# Patient Record
Sex: Female | Born: 1940 | Race: White | Hispanic: No | Smoking: Never smoker
Health system: Southern US, Community
[De-identification: ages and names within clinical notes are randomized; demographics above are authoritative.]

## PROBLEM LIST (undated history)

## (undated) DIAGNOSIS — I2510 Atherosclerotic heart disease of native coronary artery without angina pectoris: Secondary | ICD-10-CM

## (undated) DIAGNOSIS — E7800 Pure hypercholesterolemia, unspecified: Secondary | ICD-10-CM

## (undated) DIAGNOSIS — I480 Paroxysmal atrial fibrillation: Secondary | ICD-10-CM

## (undated) DIAGNOSIS — S72143A Displaced intertrochanteric fracture of unspecified femur, initial encounter for closed fracture: Secondary | ICD-10-CM

## (undated) DIAGNOSIS — K648 Other hemorrhoids: Secondary | ICD-10-CM

## (undated) DIAGNOSIS — R112 Nausea with vomiting, unspecified: Secondary | ICD-10-CM

## (undated) DIAGNOSIS — G43909 Migraine, unspecified, not intractable, without status migrainosus: Secondary | ICD-10-CM

## (undated) DIAGNOSIS — F419 Anxiety disorder, unspecified: Secondary | ICD-10-CM

## (undated) DIAGNOSIS — I5189 Other ill-defined heart diseases: Secondary | ICD-10-CM

## (undated) DIAGNOSIS — M810 Age-related osteoporosis without current pathological fracture: Secondary | ICD-10-CM

## (undated) DIAGNOSIS — M1990 Unspecified osteoarthritis, unspecified site: Secondary | ICD-10-CM

## (undated) DIAGNOSIS — Z87898 Personal history of other specified conditions: Secondary | ICD-10-CM

## (undated) DIAGNOSIS — I499 Cardiac arrhythmia, unspecified: Secondary | ICD-10-CM

## (undated) DIAGNOSIS — Z8719 Personal history of other diseases of the digestive system: Secondary | ICD-10-CM

## (undated) DIAGNOSIS — D333 Benign neoplasm of cranial nerves: Secondary | ICD-10-CM

## (undated) DIAGNOSIS — Z9889 Other specified postprocedural states: Secondary | ICD-10-CM

## (undated) DIAGNOSIS — K642 Third degree hemorrhoids: Secondary | ICD-10-CM

## (undated) DIAGNOSIS — R42 Dizziness and giddiness: Secondary | ICD-10-CM

## (undated) DIAGNOSIS — I10 Essential (primary) hypertension: Secondary | ICD-10-CM

## (undated) DIAGNOSIS — F32A Depression, unspecified: Secondary | ICD-10-CM

## (undated) DIAGNOSIS — H8110 Benign paroxysmal vertigo, unspecified ear: Secondary | ICD-10-CM

## (undated) DIAGNOSIS — H269 Unspecified cataract: Secondary | ICD-10-CM

## (undated) DIAGNOSIS — F329 Major depressive disorder, single episode, unspecified: Secondary | ICD-10-CM

## (undated) DIAGNOSIS — I080 Rheumatic disorders of both mitral and aortic valves: Secondary | ICD-10-CM

## (undated) DIAGNOSIS — K219 Gastro-esophageal reflux disease without esophagitis: Secondary | ICD-10-CM

## (undated) DIAGNOSIS — K2210 Ulcer of esophagus without bleeding: Secondary | ICD-10-CM

## (undated) HISTORY — PX: CATARACT EXTRACTION W/ INTRAOCULAR LENS  IMPLANT, BILATERAL: SHX1307

## (undated) HISTORY — DX: Pure hypercholesterolemia, unspecified: E78.00

## (undated) HISTORY — DX: Essential (primary) hypertension: I10

## (undated) HISTORY — DX: Third degree hemorrhoids: K64.2

## (undated) HISTORY — DX: Ulcer of esophagus without bleeding: K22.10

## (undated) HISTORY — DX: Displaced intertrochanteric fracture of unspecified femur, initial encounter for closed fracture: S72.143A

## (undated) HISTORY — PX: TOTAL ABDOMINAL HYSTERECTOMY: SHX209

## (undated) HISTORY — DX: Gastro-esophageal reflux disease without esophagitis: K21.9

## (undated) HISTORY — PX: DILATION AND CURETTAGE OF UTERUS: SHX78

## (undated) HISTORY — DX: Age-related osteoporosis without current pathological fracture: M81.0

## (undated) HISTORY — PX: EYE SURGERY: SHX253

## (undated) HISTORY — DX: Unspecified cataract: H26.9

## (undated) HISTORY — DX: Personal history of other specified conditions: Z87.898

## (undated) HISTORY — PX: FRACTURE SURGERY: SHX138

---
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
## 1999-11-09 HISTORY — PX: ESOPHAGOGASTRODUODENOSCOPY (EGD) WITH ESOPHAGEAL DILATION: SHX5812

## 1999-12-27 ENCOUNTER — Inpatient Hospital Stay (HOSPITAL_COMMUNITY): Admission: EM | Admit: 1999-12-27 | Discharge: 1999-12-29 | Payer: Self-pay | Admitting: Emergency Medicine

## 1999-12-27 ENCOUNTER — Encounter: Payer: Self-pay | Admitting: Cardiology

## 1999-12-27 ENCOUNTER — Encounter: Payer: Self-pay | Admitting: Emergency Medicine

## 1999-12-28 ENCOUNTER — Encounter: Payer: Self-pay | Admitting: Cardiology

## 2000-01-05 ENCOUNTER — Other Ambulatory Visit: Admission: RE | Admit: 2000-01-05 | Discharge: 2000-01-05 | Payer: Self-pay | Admitting: *Deleted

## 2001-02-13 ENCOUNTER — Other Ambulatory Visit: Admission: RE | Admit: 2001-02-13 | Discharge: 2001-02-13 | Payer: Self-pay | Admitting: Family Medicine

## 2001-02-22 ENCOUNTER — Encounter: Admission: RE | Admit: 2001-02-22 | Discharge: 2001-02-22 | Payer: Self-pay | Admitting: Family Medicine

## 2001-02-22 ENCOUNTER — Encounter: Payer: Self-pay | Admitting: Family Medicine

## 2001-05-31 ENCOUNTER — Encounter: Payer: Self-pay | Admitting: Family Medicine

## 2001-05-31 ENCOUNTER — Ambulatory Visit (HOSPITAL_COMMUNITY): Admission: RE | Admit: 2001-05-31 | Discharge: 2001-05-31 | Payer: Self-pay | Admitting: Family Medicine

## 2001-06-08 ENCOUNTER — Encounter: Payer: Self-pay | Admitting: Emergency Medicine

## 2001-06-08 ENCOUNTER — Emergency Department (HOSPITAL_COMMUNITY): Admission: EM | Admit: 2001-06-08 | Discharge: 2001-06-08 | Payer: Self-pay | Admitting: Emergency Medicine

## 2002-02-19 ENCOUNTER — Other Ambulatory Visit: Admission: RE | Admit: 2002-02-19 | Discharge: 2002-02-19 | Payer: Self-pay | Admitting: Family Medicine

## 2003-05-03 ENCOUNTER — Other Ambulatory Visit: Admission: RE | Admit: 2003-05-03 | Discharge: 2003-05-03 | Payer: Self-pay | Admitting: Family Medicine

## 2004-02-13 ENCOUNTER — Encounter (INDEPENDENT_AMBULATORY_CARE_PROVIDER_SITE_OTHER): Payer: Self-pay | Admitting: Specialist

## 2004-02-13 ENCOUNTER — Inpatient Hospital Stay (HOSPITAL_COMMUNITY): Admission: RE | Admit: 2004-02-13 | Discharge: 2004-02-15 | Payer: Self-pay | Admitting: Obstetrics and Gynecology

## 2005-02-08 ENCOUNTER — Encounter: Admission: RE | Admit: 2005-02-08 | Discharge: 2005-02-08 | Payer: Self-pay | Admitting: Family Medicine

## 2005-10-31 ENCOUNTER — Inpatient Hospital Stay (HOSPITAL_COMMUNITY): Admission: AD | Admit: 2005-10-31 | Discharge: 2005-11-03 | Payer: Self-pay | Admitting: Internal Medicine

## 2005-11-02 ENCOUNTER — Ambulatory Visit: Payer: Self-pay | Admitting: Cardiology

## 2005-11-02 ENCOUNTER — Encounter: Payer: Self-pay | Admitting: Cardiology

## 2006-01-12 ENCOUNTER — Ambulatory Visit: Payer: Self-pay | Admitting: Cardiology

## 2006-02-23 ENCOUNTER — Ambulatory Visit: Payer: Self-pay | Admitting: Cardiology

## 2006-05-10 ENCOUNTER — Ambulatory Visit: Payer: Self-pay | Admitting: Cardiology

## 2006-06-02 ENCOUNTER — Ambulatory Visit (HOSPITAL_COMMUNITY): Admission: RE | Admit: 2006-06-02 | Discharge: 2006-06-02 | Payer: Self-pay | Admitting: Family Medicine

## 2006-06-08 ENCOUNTER — Ambulatory Visit (HOSPITAL_COMMUNITY): Admission: RE | Admit: 2006-06-08 | Discharge: 2006-06-08 | Payer: Self-pay | Admitting: Gastroenterology

## 2006-06-29 ENCOUNTER — Ambulatory Visit: Payer: Self-pay | Admitting: Cardiology

## 2006-11-08 HISTORY — PX: COLONOSCOPY: SHX174

## 2007-03-01 ENCOUNTER — Other Ambulatory Visit: Admission: RE | Admit: 2007-03-01 | Discharge: 2007-03-01 | Payer: Self-pay | Admitting: Internal Medicine

## 2007-07-20 ENCOUNTER — Ambulatory Visit (HOSPITAL_COMMUNITY): Admission: RE | Admit: 2007-07-20 | Discharge: 2007-07-20 | Payer: Self-pay | Admitting: Ophthalmology

## 2007-08-24 ENCOUNTER — Ambulatory Visit (HOSPITAL_COMMUNITY): Admission: RE | Admit: 2007-08-24 | Discharge: 2007-08-24 | Payer: Self-pay | Admitting: Ophthalmology

## 2007-10-13 ENCOUNTER — Ambulatory Visit: Payer: Self-pay | Admitting: Gastroenterology

## 2007-10-16 ENCOUNTER — Ambulatory Visit (HOSPITAL_COMMUNITY): Admission: RE | Admit: 2007-10-16 | Discharge: 2007-10-16 | Payer: Self-pay | Admitting: Gastroenterology

## 2007-10-16 ENCOUNTER — Ambulatory Visit: Payer: Self-pay | Admitting: Gastroenterology

## 2007-11-29 ENCOUNTER — Ambulatory Visit: Payer: Self-pay | Admitting: Gastroenterology

## 2008-07-29 DIAGNOSIS — E785 Hyperlipidemia, unspecified: Secondary | ICD-10-CM | POA: Insufficient documentation

## 2008-07-29 DIAGNOSIS — I10 Essential (primary) hypertension: Secondary | ICD-10-CM | POA: Insufficient documentation

## 2008-07-29 DIAGNOSIS — M810 Age-related osteoporosis without current pathological fracture: Secondary | ICD-10-CM

## 2008-07-29 DIAGNOSIS — F411 Generalized anxiety disorder: Secondary | ICD-10-CM

## 2008-07-29 DIAGNOSIS — K648 Other hemorrhoids: Secondary | ICD-10-CM | POA: Insufficient documentation

## 2008-07-29 DIAGNOSIS — R011 Cardiac murmur, unspecified: Secondary | ICD-10-CM

## 2008-07-29 DIAGNOSIS — K219 Gastro-esophageal reflux disease without esophagitis: Secondary | ICD-10-CM

## 2008-07-29 HISTORY — DX: Other hemorrhoids: K64.8

## 2010-04-09 ENCOUNTER — Ambulatory Visit (HOSPITAL_COMMUNITY): Admission: RE | Admit: 2010-04-09 | Discharge: 2010-04-09 | Payer: Self-pay | Admitting: Family Medicine

## 2010-04-27 ENCOUNTER — Ambulatory Visit: Payer: Self-pay | Admitting: Cardiovascular Disease

## 2010-04-27 DIAGNOSIS — I080 Rheumatic disorders of both mitral and aortic valves: Secondary | ICD-10-CM | POA: Insufficient documentation

## 2010-04-27 HISTORY — DX: Rheumatic disorders of both mitral and aortic valves: I08.0

## 2010-04-28 ENCOUNTER — Ambulatory Visit: Payer: Self-pay | Admitting: Cardiovascular Disease

## 2010-04-28 ENCOUNTER — Ambulatory Visit (HOSPITAL_COMMUNITY): Admission: RE | Admit: 2010-04-28 | Discharge: 2010-04-28 | Payer: Self-pay | Admitting: Cardiovascular Disease

## 2010-04-28 ENCOUNTER — Encounter: Payer: Self-pay | Admitting: Cardiovascular Disease

## 2010-04-29 ENCOUNTER — Encounter: Payer: Self-pay | Admitting: Cardiovascular Disease

## 2010-12-10 NOTE — Letter (Signed)
Summary: PROGRESS NOTES  PROGRESS NOTES   Imported By: Faythe Ghee 04/28/2010 15:16:16  _____________________________________________________________________  External Attachment:    Type:   Image     Comment:   External Document

## 2010-12-10 NOTE — Letter (Signed)
Summary: [place] Results Engineer, agricultural at [facility]  [address] [place] [address]   Phone: [phone]  Fax: [phone]      April 29, 2010 MRN: [id number]   Norma Barajas [address] [place], [place]  [address]   Dear Ms. Engebretson,  Your test ordered by Selena Batten has been reviewed by your physician (or physician assistant) and was found to be normal or stable. Your physician (or physician assistant) felt no changes were needed at this time.  __X__ Echocardiogram  ____ Cardiac Stress Test  ____ Lab Work  ____ Peripheral vascular study of arms, legs or neck  ____ CT scan or X-ray  ____ Lung or Breathing test  ____ Other: Please continue on current medical treatment.  Thank you.   Charlton Haws, MD, F.A.C.C

## 2010-12-10 NOTE — Assessment & Plan Note (Signed)
Summary: **PER Norma WEEKS,NP FOR ABNORMAL EKG/TG   Visit Type:  Follow-up Primary Provider:  susan Barajas NPI   History of Present Illness: Norma Barajas is seen today ofr F/U of fatigue, MR, abnomrla ECG and CRF's of elevated lipids and HTN>  See A/P for ECG issues with misplaced V1 lead at [facility]  No SSCP or previously documented CAD.  Previous echo 2006 with only mild MR.  No CAD by cath in 2006.  Occasional flip flops but no sustained palpitaitons.  Comploant with BP and cholesterol meds.  Not very active with fatigue and overweight.  Current Problems (verified): 1)  Electrocardiogram, Abnormal  (ICD-794.31) 2)  Mitral Regurgitation  (ICD-396.3) 3)  Cardiac Murmur  (ICD-785.2) 4)  Anxiety  (ICD-300.00) 5)  Gerd  (ICD-530.81) 6)  Osteoporosis  (ICD-733.00) 7)  Hyperlipidemia  (ICD-272.4) 8)  Hypertension  (ICD-401.9) 9)  Rectal Bleeding  (ICD-569.3)  Current Medications (verified): 1)  Zocor 20 Mg Tabs (Simvastatin) .... Once Daily 2)  Atenolol 50 Mg Tabs (Atenolol) .... Two Times A Day 3)  Felodipine 5 Mg Xr24h-Tab (Felodipine) .... Once Daily 4)  Calcium +d .... Once Daily 5)  Multivitamins  Tabs (Multiple Vitamin) .... Once Daily 6)  Fish Oil 1000 Mg Caps (Omega-3 Fatty Acids) .... Once Daily 7)  Xanax 0.25 Mg Tabs (Alprazolam) .... As Needed 8)  Omeprazole 20 Mg Cpdr (Omeprazole) .... Once Daily  Allergies (verified): 1)  ! Morphine  Past History:  Past Medical History: Last updated: 05/01/2010 hypertension mitral regurgitation erosive esophagitis chest pain  Past Surgical History: Last updated: 07/29/2008 hysterectomy uterine prolapse cataracts  Family History: Last updated: May 01, 2010 Father:deceased cause unknown Mother:deceased cause unknown  Social History: Last updated: 01-May-2010 Retired  Married  Tobacco Use - No.  Alcohol Use - no Regular Exercise - no Drug Use - no  Review of Systems       Denies fever, malais, weight loss, blurry vision,  decreased visual acuity, cough, sputum, SOB, hemoptysis, pleuritic painheartburn, abdominal pain, melena, lower extremity edema, claudication, or rash.   Vital Signs:  Patient profile:   70 year old female Height:      61 inches Weight:      132 pounds BMI:     25.03 Pulse rate:   64 / minute BP sitting:   139 / 70  (right arm)  Vitals Entered By: Dreama Saa, CNA (April 27, 2010 2:48 PM)  Physical Exam  General:  Affect appropriate Healthy:  appears stated age HEENT: normal Neck supple with no adenopathy JVP normal no bruits no thyromegaly Lungs clear with no wheezing and good diaphragmatic motion Heart:  S1/S2 systolic  murmur,rub, gallop or click PMI normal Abdomen: benighn, BS positve, no tenderness, no AAA no bruit.  No HSM or HJR Distal pulses intact with no bruits No edema Neuro non-focal Skin warm and dry    Impression & Recommendations:  Problem # 1:  MITRAL REGURGITATION (ICD-396.3) F/U echo.  Murmur seems benign.  Doubt progression since 2006 Orders: 2-D Echocardiogram (2D Echo)  Problem # 2:  HYPERLIPIDEMIA (ICD-272.4) Target LDL 100 or less with no known CAD  Cath 2006 clean Her updated medication list for this problem includes:    Zocor 20 Mg Tabs (Simvastatin) ..... Once daily  Problem # 3:  HYPERTENSION (ICD-401.9) Well controlled Her updated medication list for this problem includes:    Atenolol 50 Mg Tabs (Atenolol) .Marland Kitchen..Marland Kitchen Two times a day    Felodipine 5 Mg Xr24h-tab (Felodipine) ..... Once daily  Problem #  4:  ELECTROCARDIOGRAM, ABNORMAL (ICD-794.31) Reviewed ECG from [facility] 03/31/10  Misplaced V1 lead led computer to call RSR' and spetal infarct.  ECG in our office today totally normal Her updated medication list for this problem includes:    Atenolol 50 Mg Tabs (Atenolol) .Marland Kitchen..Marland Kitchen Two times a day    Felodipine 5 Mg Xr24h-tab (Felodipine) ..... Once daily  Patient Instructions: 1)  Your physician recommends that you schedule a follow-up  appointment in: 1 year 2)  Your physician recommends that you continue on your current medications as directed. Please refer to the Current Medication list given to you today. 3)  Your physician has requested that you have an echocardiogram.  Echocardiography is a painless test that uses sound waves to create images of your heart. It provides your doctor with information about the size and shape of your heart and how well your heart's chambers and valves are working.  This procedure takes approximately one hour. There are no restrictions for this procedure.   EKG Report  Procedure date:  04/27/2010  Findings:      NSR 63 Normal ECG Compared to ECG 5/24 in [facility] office V1 lead placed correctly no RSR' or septal infarct seen

## 2010-12-10 NOTE — Letter (Signed)
Summary: EKG 03/31/10  EKG 03/31/10   Imported By: Faythe Ghee 04/28/2010 15:15:58  _____________________________________________________________________  External Attachment:    Type:   Image     Comment:   External Document

## 2010-12-10 NOTE — Letter (Signed)
Summary: PROGRESS NOTES  PROGRESS NOTES   Imported By: Faythe Ghee 04/28/2010 15:16:41  _____________________________________________________________________  External Attachment:    Type:   Image     Comment:   External Document

## 2011-02-18 ENCOUNTER — Encounter: Payer: Self-pay | Admitting: Nurse Practitioner

## 2011-02-18 DIAGNOSIS — D333 Benign neoplasm of cranial nerves: Secondary | ICD-10-CM | POA: Insufficient documentation

## 2011-02-18 DIAGNOSIS — R911 Solitary pulmonary nodule: Secondary | ICD-10-CM | POA: Insufficient documentation

## 2011-02-18 HISTORY — DX: Benign neoplasm of cranial nerves: D33.3

## 2011-03-23 NOTE — Assessment & Plan Note (Signed)
Barajas, Norma                    CHART#:  [id number]   DATE:  11/29/2007                       DOB:  [birth year]   CHIEF COMPLAINT:  Follow-up of rectal bleeding and abdominal bloating.   SUBJECTIVE:  Norma Barajas is a 70 year old lady who recently underwent  colonoscopy on 10/16/07 by Dr. Kassie Mends.  She presented with  complaints of rectal bleeding. She had large internal hemorrhoids but  otherwise unremarkable.  She states her rectal bleeding has stopped  since taking Anusol HC suppositories.  She continues to complain of  abdominal bloating and discomfort and cramps after having a bowel  movements.  She states that she has gained about 20 pounds in the last  one year.  She says her thyroid was checked about a year ago by Dr.  Dimple Casey.  For the past 6 months she has had abdominal bloating and  discomfort.  Seems to have more discomfort after having a bowel  movement.  Denies any heartburn as long as she takes her omeprazole.  Certain foods seem to worsen her symptoms.  Denies any prior x-rays for  any of these symptoms.  She had a hysterectomy which she believes to be  complete about 4 years ago.   CURRENT MEDICATIONS:  See above list.   ALLERGIES:  Morphine.   PHYSICAL EXAMINATION:  VITALS:  Weight 144, temperature 97.3, blood  pressure 150/92, pulse 64.  GENERAL:  Pleasant, well-nourished, well-developed Caucasian female, no  acute distress.  SKIN:  Warm and dry, no jaundice.  HEENT:  Sclerae  nonicteric.  ABDOMEN:  Positive bowel sounds. Abdomen soft,  nondistended.  She has mild lower abdominal discomfort to deep  palpation, no rebound, tenderness or guarding.  No abdominal bruits or  hernias. LOWER EXTREMITIES:  No edema.   IMPRESSION:  Norma Barajas is a 69 year old lady who recently had a  colonoscopy for hematochezia due to large internal hemorrhoids.  Hematochezia has resolved.  She complains of vague abdominal bloating  and cramping, worse with bowel movements.  I offered  her further  evaluation of her abdominal pain via CT.  She would like to try some  probiotic therapy for abdominal bloating and gas prior to making a  decision regarding further workup.   PLAN:  1. Digestive Advantage Gas formula with Probiotics, take one daily #37      was provided.  2. She will call with progress report in 4 weeks and based on how she      is feeling at that time would consider further workup via CT of      abdomen and pelvis.       Tana Coast, P.A.  Electronically Signed     Kassie Mends, M.D.  Electronically Signed    LL/MEDQ  D:  11/29/2007  T:  11/29/2007  Job:  [id number]   cc:   Ernestina Penna, M.D.

## 2011-03-23 NOTE — Op Note (Signed)
Norma Barajas, Norma Barajas                   ACCOUNT NO.:  [id number]   MEDICAL RECORD NO.:  [id number]          PATIENT TYPE:  AMB   LOCATION:  DAY                           FACILITY:  [facility]   PHYSICIAN:  Kassie Mends, M.D.      DATE OF BIRTH:  December 11, 1940   DATE OF PROCEDURE:  10/16/2007  DATE OF DISCHARGE:                               OPERATIVE REPORT   PROCEDURE:  Colonoscopy.   INDICATION FOR EXAM:  Norma Barajas is a 70 year old female who presents with  rectal bleeding.  She has a first degree relative had colon cancer  diagnosed after the age of 2.  Her last colonoscopy was reportedly four  years ago and with Dr. Jarold Motto.   FINDINGS:  1. Normal colon without evidence of polyps, masses, inflammatory      changes, diverticula or arteriovenous malformations.  2. Large internal hemorrhoids.  Otherwise normal retroflexed view of      the rectum.   RECOMMENDATIONS:  1. The only source identified as an etiology for rectal bleeding are      the large internal hemorrhoids.  She should have a screening      colonoscopy in 10 years if she remains healthy since her first      degree relative was diagnosed with colon cancer after the age of      59.  2. She should follow high fiber diet.  She is given a handout on high-      fiber diet and hemorrhoids.  3. She should begin Anusol-HC one per rectum every 12 hours for 14      days.  4. Follow up visit in 4 weeks with Lorenza Burton to reevaluate rectal      bleeding.  If her rectal bleeding persists, she may continue a      conservative approach or we will be happy to make a surgical      referral.  5. She should avoid aspirin for 7 days so as not to precipitate rectal      bleeding.  She may take NSAIDs and have anticoagulation if needed.   MEDICATIONS:  1. Demerol 50 mg IV.  2. Versed 5 mg IV.   PROCEDURE TECHNIQUE:  Physical exam was performed.  Informed consent was  obtained from the patient after explaining the benefits and risks and  alternatives to the procedure.  The patient was connected to monitor and  placed in left lateral position.  Continuous oxygen was provided by  nasal cannula, IV medicine administered through an  indwelling cannula.  After administration of sedation and rectal exam,  scope was advanced under direct visualization to cecum.  The scope was  removed slowly by carefully examining the color, texture, anatomy and  integrity mucosa on the way out.  The patient was recovered in endoscopy  and discharged home in satisfactory condition.      Kassie Mends, M.D.  Electronically Signed     SM/MEDQ  D:  10/16/2007  T:  10/16/2007  Job:  [id number]   cc:   Ernestina Penna, M.D.  Fax:  [phone] 

## 2011-03-23 NOTE — Consult Note (Signed)
NAME:  Norma Barajas, Norma Barajas                   ACCOUNT NO.:  [id number]   MEDICAL RECORD NO.:  [id number]          PATIENT TYPE:  AMB   LOCATION:  DAY                           FACILITY:  [facility]   PHYSICIAN:  Kassie Mends, M.D.      DATE OF BIRTH:  1941-09-14   DATE OF CONSULTATION:  DATE OF DISCHARGE:                                 CONSULTATION   REASON FOR CONSULTATION:  Rectal bleeding.   HISTORY OF PRESENT ILLNESS:  [name] is a 70 year old female.  She  developed abdominal bloating and the cramp-like abdominal pain 6 days  ago.  The following morning she went to  the bathroom.  She had a loose  bowel movement and noticed a moderate amount of bright red blood.  Later  on that day, she noticed when she went to urinate she was having bright  red blood with wiping and in the water.  She has had bright red blood  with each bowel movement since that day.  She is having one to two bowel  movements per day currently.  She denies any nausea or vomiting.  She  denies any recent NSAID use.  She has noticed some low-back pain over  the last 2 days as well.  She denies any anorexia or early satiety,  weight loss.  Denies any dysphagia or odynophagia.  She does have  history of chronic GERD well controlled on PPI.  She denies any chest  pain, palpitations or shortness of breath.  She did have a hemoglobin  done at [facility] on 12/01 which was 15.7   PAST MEDICAL AND SURGICAL HISTORY:  She has a heart murmur.  She has  history of hypertension, hypercholesterolemia, hemorrhoids,  osteoporosis, chronic GERD  with erosive reflux esophagitis and  esophageal stricture with EGD by Dr. Antoine Poche years ago.  She has  history of a colonoscopy she believes was over 4 years ago which was  normal through Dr. Norval Gable office.  She also had a normal  colonoscopy in the 1990s.  She has history of cataract surgery, complete  hysterectomy in April 2005.   CURRENT MEDICATIONS:  1. Zocor 20 mg  daily  2. Atenolol 50 mg b.i.d.  3. Felodipine 5 mg daily  4. Calcium and vitamin D once daily  5. Multivitamin once daily  6. Fish oil once daily  7. Xanax 0.25 mg p.r.n.  8. Omeprazole 20 mg daily.   ALLERGIES:  MORPHINE.   FAMILY HISTORY:  Is positive for mother diagnosed with colon cancer, she  believes in her late 66s.  She died at 7.  Father deceased at age 31  severe coronary artery disease.  She had a sister at that time with  heart problems as well and two brothers who appear healthy.   SOCIAL HISTORY:  [name]  is married.  She has one healthy son.  She is  retired Education officer, environmental.  She denies any tobacco, alcohol or drug use.   REVIEW OF SYSTEMS:  CONSTITUTIONAL:  Weight is steadily increasing.  Denies any fever or chills.  Otherwise negative, see HPI.   PHYSICAL EXAM:  VITAL SIGNS:  Weight 145 pounds, height 5 feet 2 inches,  temperature is 98.,1 blood pressure 178/96, pulse 60.  GENERAL:  [name] Is well-developed, well-nourished Caucasian female in  no acute distress.  HEENT:  Pupils equal.  Sclerae benign.  Conjunctiva are pink.  Oropharynx pink and moist.  She has upper dentures intact.  NECK:  Supple without thyromegaly.  CHEST:  Heart regular rate and rhythm.  She has a 3/6 murmur noted.  LUNGS:  Clear to auscultation bilaterally.  ABDOMEN:  Positive bowel sounds x4.  No bruits auscultated.  Soft,  nontender and without palpable mass or megaly.  No rebound  or guarding.  EXTREMITIES:  Without clubbing or edema bilaterally.  RECTAL:  No external lesions visualized.  Good sphincter tone.  No  masses palpated.  Small amount of dark brown stool obtained from the  vault which was hemoccult positive.   IMPRESSION:  [name] is a 70 year old Caucasian female who has had a 6-  day history of cramp like abdominal pain and bloating and intermittent  rectal bleeding.  She is going to require further evaluation with  colonoscopy at this time to determine etiology of her  rectal bleeding  which could be diverticular in nature versus internal hemorrhoids,  ischemia  or colorectal carcinoma.  She has a family history of colon  cancer in her mother in her late 32s.   PLAN:  1. Colonoscopy with Dr. Cira Servant as soon as possible.  I have discussed      the procedure including risks, benefits, including but not limited      to infection, perforation, drug reaction.  She agrees with the plan      and wishes to proceed.  2  If she develops significant bleeding prior to colonoscopy, she is to  go directly to the emergency room.   We would like to thank Paulene Floor FNP and Dr. Christell Constant for allowing [place] to  participate in the care of Ms. Hoxworth.      Lorenza Burton, N.P.      Kassie Mends, M.D.  Electronically Signed    KJ/MEDQ  D:  10/13/2007  T:  10/13/2007  Job:  [id number]   cc:   Ernestina Penna, M.D.  Fax: [phone]

## 2011-03-26 NOTE — H&P (Signed)
Norma Barajas, Norma Barajas NO.:  [id number]   MEDICAL RECORD NO.:  [id number]                   PATIENT TYPE:   LOCATION:                                       FACILITY:   PHYSICIAN:  Crist Fat. Rivard, M.D.              DATE OF BIRTH:  Jun 04, 1941   DATE OF ADMISSION:  02/13/2004  DATE OF DISCHARGE:                                HISTORY & PHYSICAL   REASON FOR ADMISSION:  Symptomatic pelvic relaxation.   HISTORY OF PRESENT ILLNESS:  This is a 70 year old married white female,  gravida 1, para 1, who presents to me on December 19, 2003, with complaints  of feeling a bulge outside of the vagina for the last month with pressure  and feeling like something is out when she wipes using the bathroom.  She  reports occasional difficulty to initiate voiding but no difficulty with  bowel movements.  She also reports incomplete emptying but denies stress  incontinence.  Nocturia has been stable at two to three voiding episodes per  night, and she denies dyspareunia or feeling of any obstruction during  sexual activity.  She otherwise has been menopausal since age 55 and used  Prempro from age 76 to age 59.  She has now discontinued hormone replacement  therapy for the last two years and reports occasional hot flashes at night.   REVIEW OF SYSTEMS:  HEAD, EYES, EARS, NOSE, AND THROAT:  Negative.  CARDIOVASCULAR:  Negative.  MUSCULOSKELETAL:  Negative.  GASTROINTESTINAL:  Negative.  GENITOURINARY:  See HPI.  PSYCHIATRIC:  Negative.  NEUROLOGIC:  Negative.   PAST MEDICAL HISTORY:  1. Hypertension.  2. Osteopenia.  3. Spontaneous vaginal delivery x1.  4. No previous surgery other than extraction of wisdom teeth.   SOCIAL HISTORY:  Married, nonsmoker, is retired.   FAMILY HISTORY:  Negative for feminine cancer or colon cancer.   CURRENT MEDICATIONS:  1. Fosamax 70 mg each week.  2. Atenolol 50 mg daily.  3. Caduet 520 mg daily.  4. Fish oil 1000 mg daily.  5.  Multivitamin daily.  6. Oral calcium daily.  7. Pepcid AC 20 mg daily.   No known drug allergies.   PHYSICAL EXAMINATION:  VITAL SIGNS:  Current blood pressure is 130/80,  current weight 128 pounds for a height of 5 feet 3/4 inch.  HEENT:  Negative.  NECK:  Thyroid not enlarged.  CARDIAC:  Heart regular rate and rhythm with a systolic murmur 3/4, known to  the patient.  CHEST:  Clear.  BACK:  No CVA tenderness.  ABDOMEN:  Nontender, no hepatosplenomegaly.  EXTREMITIES:  Negative.  NEUROLOGIC:  Within normal limits.  PELVIC:  External genitalia showing cystocele 3/4 with negative stress test,  no urethrocele, no rectocele.  Cervix is normal.  Uterus is prolapsed 3/4,  normal size, normal shape.  Adnexa are normal.  Rectovaginal exam is  negative.  ASSESSMENT:  Symptomatic pelvic relaxation in patient desiring definitive  surgical treatment, including prophylactic bilateral salpingo-oophorectomy.   PLAN:  We have reviewed all possible options with the patient, including  expectant management, trial of pessary, or surgical correction, which the  patient has chosen.  We have planned to proceed with laparoscopy-assisted  vaginal hysterectomy as well as bilateral salpingo-oophorectomy and anterior  repair.  The procedure has been thoroughly reviewed with the patient,  including hospital stay, recovery, possible complications including but not  limited to bleeding, infection, injury to bowel, bladder, or ureters, need  for laparotomy, need for home catheter, 10% risk of postop incontinence.  Risks and benefits of prophylactic bilateral salpingo-oophorectomy have also  been reviewed.  The patient is agreeable to these explanations and desires  the discussed procedure.                                               Crist Fat Rivard, M.D.    SAR/MEDQ  D:  01/22/2004  T:  01/22/2004  Job:  [id number]

## 2011-03-26 NOTE — H&P (Signed)
Norma Barajas, Norma Barajas NO.:  [id number]   MEDICAL RECORD NO.:  [id number]          PATIENT TYPE:  INP   LOCATION:  2912                         FACILITY:  [facility]   PHYSICIAN:  Doylene Canning. Ladona Ridgel, M.D.  DATE OF BIRTH:  [birth year]   DATE OF ADMISSION:  10/31/2005  DATE OF DISCHARGE:                                HISTORY & PHYSICAL   ADMITTING DIAGNOSIS:  Chest pain with both typical, as well as atypical,  components.   HISTORY OF PRESENT ILLNESS:  The patient is a very pleasant 70 year old  woman with a history of hypertension, who underwent catheterization back in  2001 demonstrating normal left ventricular systolic function with at least  moderate, if not moderate to severe, mitral regurgitation.  She had normal  filling pressures at that time and normal coronary arteries.  She has had no  significant chest pain until the last few weeks when she notes intermittent  episodes which she did not mention to any family members.  Today, she began  to experience substernal chest discomfort with some pain in her back.  The  patient denies nausea, vomiting, or shortness of breath.  She denies fevers  or chills.  She has had some cough which has been nonproductive.  She denies  syncope.   PAST MEDICAL HISTORY:  1.  Hypertension.  2.  History of pelvic relaxation surgery.  3.  History of mitral regurgitation as previously noted.   FAMILY HISTORY:  Father with coronary disease.  Both her parents are  deceased.   SOCIAL HISTORY:  The patient denies tobacco or ethanol use and is married.   REVIEW OF SYSTEMS:  Negative for vision or hearing problems, although she  does wear glasses for visual acuity.  She denies nausea, vomiting, diarrhea,  constipation, polyuria, polydipsia, heat or cold intolerance.  She had a  history of esophagitis type symptoms which manifested as chest pain in the  past.  She denies skin problems.  She denies arthritic complaints.  She  denies any  recent weight changes, polyuria, polydipsia, heat or cold  intolerance.  The rest of her review of systems was reviewed in detail and  found to be negative.   PHYSICAL EXAMINATION:  GENERAL:  The patient is a pleasant 70 year old woman  in no distress.  VITAL SIGNS:  The blood pressure was 107/51, pulse 70 and regular,  respirations 18, temperature 98.  HEENT:  Normocephalic and atraumatic.  Pupils equal and round.  The  oropharynx was moist.  The sclerae were anicteric.  NECK:  No JVD.  There was no thyromegaly.  The trachea was midline.  The  carotids were 2+ and symmetric.  LUNGS:  Clear bilaterally to auscultation.  There were no wheezing, rales,  or rhonchi.  CARDIOVASCULAR:  Regular rate and rhythm with normal S1 and S2.  There was a  grade 2/6 systolic murmur at the left lower sternal border.  The PMI was  minimally displaced laterally and not enlarged.  ABDOMEN:  The abdominal exam was soft, nontender, nondistended.  There was  no organomegaly.  Bowel  sounds were present.  EXTREMITIES:  No clubbing, cyanosis, or edema.  The pulses were 2+ and  symmetric.  SKIN:  Normal.  JOINT EXAM:  Normal.  NEUROLOGIC:  Alert and oriented x3 with cranial nerves intact.  Strength was  5/5 and symmetric.   EKG demonstrated no acute changes.  There was voltage criteria for LVH.   IMPRESSION:  1.  Ongoing atypical chest pain.  2.  Hypertension.  3.  Mitral regurgitation.  4.  History of erosive esophagitis.   DISCUSSION:  The etiology of the patient's chest pain is unclear.  I doubt  that this is an acute coronary syndrome.  She does have mitral  regurgitation, which has been at least moderate, if not severe, in the past,  although she is minimally symptomatic with this, having essentially no heart  failure symptoms.  Will plan to check a D-dimer and to proceed with a  sedimentation rate.  She is presently on intravenous nitroglycerin and will  give her Lovenox to start with.  Will give  her a beta blocker and continue  her home medications.  Serial cardiac enzymes will help dictate, along with  how her pain subsides, whether we proceed with an interventional approach or  a conservative approach.           ______________________________  Doylene Canning. Ladona Ridgel, M.D.     GWT/MEDQ  D:  10/31/2005  T:  11/01/2005  Job:  [id number]   cc:   Magnus Sinning. Rice, M.D.  Fax: [phone]   Rollene Rotunda, M.D.  [phone]  [address]  [place]  [place] [address]

## 2011-03-26 NOTE — Discharge Summary (Signed)
NAMESHAJUANA, MCLUCAS NO.:  [id number]   MEDICAL RECORD NO.:  [id number]          PATIENT TYPE:  INP   LOCATION:  2905                         FACILITY:  [facility]   PHYSICIAN:  Maple Mirza, P.A. DATE OF BIRTH:  1940/11/15   DATE OF ADMISSION:  10/31/2005  DATE OF DISCHARGE:                                 DISCHARGE SUMMARY   ADDENDUM:  Ms. Crosley had a CT scan of the chest to rule out pulmonary  embolism on December 27.  She was premedicated for possible contrast allergy  since she had a burning feeling all over after the left heart  catheterization on December 26.  She had no such symptoms at this CT study.  The study was negative for pulmonary embolism.  The patient has been ruled  out for pulmonary embolism doubly in a sense because her D-dimer was 0.24  and the patient has been on subcutaneous Lovenox this hospitalization.      Maple Mirza, P.A.     GM/MEDQ  D:  11/03/2005  T:  11/03/2005  Job:  [id number]   cc:   Rollene Rotunda, M.D.  [address]  [address]  [place]  [place] [address]   Magnus Sinning. Rice, M.D.  Fax: [phone]

## 2011-03-26 NOTE — Cardiovascular Report (Signed)
NAMEBRYTON, WAIGHT NO.:  [id number]   MEDICAL RECORD NO.:  [id number]          PATIENT TYPE:  INP   LOCATION:  2905                         FACILITY:  [facility]   PHYSICIAN:  Charlton Haws, M.D.     DATE OF BIRTH:  1941/09/02   DATE OF PROCEDURE:  11/02/2005  DATE OF DISCHARGE:                              CARDIAC CATHETERIZATION   Coronary arteriography.   INDICATIONS:  Chest pain concerning for angina.   Cine catheterization was done with 6-French catheters from right femoral  artery. The patient was fairly hypertensive during the case. She got 1.25 of  enalapril and 10 of labetalol.   Left main coronary had 20% discrete stenosis.   Left anterior descending artery had 20% multiple discrete stenoses in the  mid and distal portion. First and second diagonal branches were normal.   Circumflex coronary artery was normal.   Right coronary artery was dominant and normal.   RAO VENTRICULOGRAPHY:  RAO ventriculography showed hyperdynamic LV function.  EF was 80%. There was no gradient across the aortic valve. No MR. Aortic  pressure was in the 170/84 range. LV pressure was in the 170/17 range.   RAO ventriculography also showed no significant dilatation or dissection of  the proximal aortic root.   Hand injection of the right femoral artery showed [place] to be in good position  for AngioSeal. A 6-French collagen plug was deployed in the right femoral  artery with good hemostasis.   The patient tolerated the procedure well.   IMPRESSION:  Stable probably noncardiac chest pain. No evidence of proximal  aortic root disease. Follow-up with Dr. Antoine Poche in three weeks to further  assess her blood pressure.           ______________________________  Charlton Haws, M.D.     PN/MEDQ  D:  11/02/2005  T:  11/03/2005  Job:  [id number]

## 2011-03-26 NOTE — Op Note (Signed)
Norma Barajas, Norma Barajas                             ACCOUNT NO.:  [id number]   MEDICAL RECORD NO.:  [id number]                   PATIENT TYPE:  INP   LOCATION:  9399                                 [facility]   PHYSICIAN:  Crist Fat. Rivard, M.D.              DATE OF BIRTH:  1941-03-14   DATE OF PROCEDURE:  02/13/2004  DATE OF DISCHARGE:                                 OPERATIVE REPORT   PREOPERATIVE DIAGNOSIS:  Uterine prolapse and cystocele.   POSTOPERATIVE DIAGNOSIS:  Uterine prolapse and cystocele.   ANESTHESIA:  General, Dr. Jean Rosenthal   PROCEDURE:  Laparoscopy assisted vaginal hysterectomy with bilateral  salpingo-oophorectomy and anterior repair.   SURGEON:  Crist Fat. Rivard, M.D.   ASSISTANTMarquis Lunch. Lowell Guitar, P.A.   ESTIMATED BLOOD LOSS:  100 mL.   DESCRIPTION OF PROCEDURE:  After being informed of planned procedure with  possible complications including bleeding, infection, injury to bowel,  bladder, and ureters, need for laparotomy, postop incontinence, hospital  stay and recovery, informed consent was obtained.  The patient is taken to  OR 4 and given general anesthesia with endotracheal intubation without  complications.  She was placed in a lithotomy position and prepped and  draped in a sterile fashion.  Her bladder was emptied after she had  spontaneously voided showing a post void residual volume of 160 mL.  Then, a  Foley catheter is inserted.  The anterior lip of the cervix was grasped with  a tenaculum forceps and an intrauterine manipulator is placed into the  uterus.  We proceeded with infiltration of the umbilical area using 5 mL of  Marcaine 0.25% and performed a 10 mm semielliptical incision to insert the  Veress  needle.  We then insufflated a pneumoperitoneum with CO2 at a  maximum pressure of 15 mmHg.  The Veress needle was removed and a 10 mm  trocar was inserted with the laparoscope on video monitor.  Two 5 mm  incisions are then performed in each  lower quadrant after infiltration with  2 mL of Marcaine 0.25% each.  Both 5 mm trocar are inserted under direct  visualization.   Observation:  The uterus is small, normal in appearance, two normal tubes,  two normal ovaries.  The bowel appears normal.  The omentum appears normal.  We are able to easily visualize both ureters which allows [place] to cauterize  both infundibulopelvic ligaments using tripolar with the ureter under direct  visualization.  Those ligaments are cauterized and sectioned.  Both round  ligaments were then cauterized and sectioned and this is brought down to the  uterine vessels by opening the broad ligament with Metzenbaum scissors ands  gently and bluntly dissecting the bladder down.  Hemostasis is adequate.  We  irrigate with warm saline and hemostasis is again adequate.  We then moved  to the vaginal time.   The intrauterine manipulator is  removed and the cervix is grasped with two  Christella Hartigan forceps.  We infiltrate the vaginal mucosa around the cervix using  lidocaine 1% and 1:100,000 epinephrine, 10 mL.  We can now incise this  vaginal mucosa in a circumferential manner and bluntly dissect the anterior  and posterior cul-de-sac bluntly.  Both cul-de-sacs are entered easily and a  weighted speculum is inserted in the posterior cul-de-sac.  We are able to  isolate both uterosacral ligaments with Rogers forceps, section those  ligaments and suture them with a transfixion suture of 0 Vicryl kept for  future suspension.  The uterine vessels are then clamped easily with Rogers  forceps, sectioned, and sutured with 0 Vicryl and the uterus is removed  entirely with its two tubes and ovaries.  Hemostasis is completed on the  left uterosacral ligament with a figure-of-eight stitch of 0 Vicryl.  We  then place a running lock suture of 0 Vicryl on the posterior vaginal edge  reuniting it with the two uterosacral ligaments.  This is accomplished with  a 0 Vicryl.  We then  perform a McCalls procedure to obliterate the posterior  cul-de-sac using two sutures of 0 Vicryl reuniting uterosacral ligaments and  posterior cul-de-sac.  Those are tied easily.  Hemostasis is again  reassessed and adequate.   We then proceed to the anterior repair.  The anterior vaginal mucosa is  grasped with Allis forceps and infiltrated in the midline with lidocaine 1%  with 1:100,000 epinephrine, 8 mL.  We are able to open that mucosa medially  after dissecting with scissors.  We then bluntly and sharply dissect the  vaginal mucosa from the prevesical fascia until the cystocele is completely  mobilized as well as the uterovesical angle.  We are able to perform a Minerva Fester suture with 2-0 Vicryl correcting completely the angle of the  urethra.  We then plicate the cystocele with U-stitches of 2-0 Vicryl on the  prevesical fascia.  We are able to subsequently completely correct the  cystocele with good suspension.  Excess anterior vaginal mucosa is excised  and the vaginal mucosa is closed with figure-of-eight stitches of 2-0  Vicryl.  We are able to then close the apex of the vagina using figure-of-  eight stitches of 0 Vicryl in the medial fascia to maintain length.  Hemostasis is adequate.  We pack the vagina with a 1 inch pack and Estrace  cream.  We then go back to laparoscopy time reinsufflating the  pneumoperitoneum with CO2 and adequate pressure of 15 mmHg.   We then irrigate profusely with warm saline and we have a site of bleeding  on the bladder that is controlled with bipolar cauterization.  Because of  its location, the integrity of the bladder is evaluated by filling it with  120 mL of infant formula and no leak is observed.  The bladder is emptied  again.  Both infundibulopelvic ligament pedicles are evaluated and  hemostatic and both ureters are seen normal with normal peristalsis.  We  then remove our two 5 mm trocars under direct visualization, hemostasis  is adequate, and then we remove all instruments after evacuating  pneumoperitoneum.  The 10 mm incision in the umbilical area is closed in two  layers, first with a figure-of-eight stitch of 0 Vicryl on the fascia and  then with a subcuticular suture of 4-0 Vicryl.  The 5 mm incision is closed  with subcuticular stitches of 4-0 Vicryl.   The instrument and sponge  counts were correct x 2.  Estimated blood loss is  100 mL.  The procedure is very well tolerated by the patient who is taken to  the recovery room in a well and stable condition.                                               Crist Fat Rivard, M.D.    SAR/MEDQ  D:  02/13/2004  T:  02/13/2004  Job:  [id number]

## 2011-03-26 NOTE — Discharge Summary (Signed)
[place]  Patient:    Norma Barajas, Norma Barajas                          MRN: [id number] Adm. Date:  [id number] Disc. Date: [id number] Attending:  Rollene Rotunda Dictator:   Delton See, P.A. CC:         Rollene Rotunda, M.D. [facility]             Robert D. Arlyce Dice, M.D. [facility]             Jacalyn Lefevre, M.D.                  Referring Physician Discharge [place]  DATE OF BIRTH:  1941-09-10  HISTORY ON ADMISSION:  This is a 70 year old female who is admitted to [facility] December 27, 1999 by Dr. Antoine Poche for further evaluation of chest pain. She has a history of a heart murmur and an echo performed in the 1980s. However, these results were not available at time of admission.  The patient had a several-week history of exertional chest pain and was admitted for further evaluation.  PAST MEDICAL HISTORY:  Significant for hypertension x 20-30 years, no diabetes,  cholesterol status unknown.  ALLERGIES:  No known drug allergies.  MEDICATIONS AT THE TIME OF ADMISSION: 1. Prempro daily. 2. Hytrin 2 mg daily. 3. Tenormin 50 mg daily.  SOCIAL HISTORY:  The patient is married.  She has one son.  She does not use tobacco.  HOSPITAL COURSE:  As noted, this patient was admitted to further evaluate exertional chest pain.  She had cardiac enzymes performed, which did come back mildly elevated.  On February 19, a CK-MB revealed a CK of 117, MB 6.4, relative index 5.5.  On February 18, her initial CK-MB was 93, MB 2.2, index 2.4. Follow-up revealed CK 125, MB 10.6, index 8.5.  A follow-up revealed CK 137, MB 12, relative index 8.8.  Her initial troponin I was less than 0.03.  A follow-up was 0.78, and a final was 0.25.  On December 27, 1999 the patient developed persistent chest pain that was unrelieved with IV nitroglycerin, morphine, and Integrilin, as well as IV Lopressor.  She stated she felt as if she was filling up with fluid.  She was given IV  Lasix and diuresed.  Eventually she was taken emergently to the cardiac catheterization laboratory by Dr. Gerri Spore.  On December 27, 1999 she had a cardiac catheterization performed, which revealed normal right and left heart pressures, with moderately decreased cardiac output. She had normal left ventricular ejection fraction, with 3-4+ moderate to severe  mitral regurgitation.  She had normal coronary arteries.  A GI consult was obtained secondary to ongoing chest discomfort.  The patient underwent upper endoscopy, which showed diffusely eroded mucosa, with narrow gastroesophageal stricture and erythema in the bulb.  It was thought the patient had erosive esophagitis.  She had an esophageal stricture and underwent dilatation.  A 2-D echo was performed on December 28, 1999.  This showed normal LV function,  with mild LVH and mild left atrial enlargement.  She had moderate MR and moderate TR.  Arrangements were eventually made to discharge the patient in improved condition on December 29, 1999.  LABORATORY DATA:  Please see cardiac enzymes as noted above.  A basic metabolic  panel on the day of discharge revealed a BUN of 9, creatinine 0.8, potassium 3.5, calcium 7.9.  A chest  x-ray on February 19 showed no congestive heart failure.  A CBC on February 19 revealed hemoglobin 15.4, hematocrit 43.4, wbcs 9600. Platelets 209,000.  A lipid profile revealed cholesterol 250, triglycerides 237, HDL 49, LDL 154.  A TSH was elevated at 6.795.  PT and PTT at time of admission  were within normal limits.  Please see results of echo as noted above.  EKGs showed normal sinus rhythm, with nonspecific ST abnormalities.  DISCHARGE MEDICATIONS: 1. Protonix 40 mg each day. 2. Lasix 20 mg daily. 3. K-Dur 20 mEq daily. 4. Atenolol 50 mg daily. 5. Prempro as previously taken.  The patient was taken off her Hytrin.  ACTIVITY:  The patient was told to avoid any strenuous activity or  driving for 24 hours.  DIET:  She was to stay on a low salt/low fat diet.  SPECIAL INSTRUCTIONS:  She was to call the office if she had any increased pain, swelling, or bleeding from her groin.  She was to be on endocarditis prophylaxis secondary to her valvular findings.  FOLLOW-UP:  She was to follow up with Dr. Antoine Poche in [facility] March 14 at 3 p.m.; Dr. Hyacinth Meeker as scheduled or as needed; Dr. Arlyce Dice or Dr. Lina Sar as needed.  PROBLEM LIST AT TIME OF DISCHARGE:  1. Cardiac catheterization December 25, 1999, revealing normal coronary arteries,     ejection fraction 59%, with moderate to severe mitral regurgitation.  2. History of hypertension.  3. Gastrointestinal consult, with upper endoscopy revealing erosive esophagitis     with esophageal stricture, status post dilatation.  4. Mild congestive heart failure.  5. Atrial tachycardia.  6. A 2-D echocardiogram performed February 19, revealing normal left ventricular     function, mild left ventricular hypertrophy, mild left atrial enlargement,     moderate mitral regurgitation and tricuspid regurgitation.  7. Elevated lipids.  8. Elevated TSH - 6.795.  9. Hypokalemia, corrected. 10. Elevated cardiac enzymes, possibly due to spasm or distal disease.  ADDENDUM:  It was also recommended the patient have her lipids repeated in three months.  She should have a repeat echo in six months.DD:  12/29/99 TD:  12/29/99 Job: [id number]

## 2011-03-26 NOTE — Discharge Summary (Signed)
Norma Barajas, Norma Barajas                             ACCOUNT NO.:  [id number]   MEDICAL RECORD NO.:  [id number]                   PATIENT TYPE:  INP   LOCATION:  9303                                 FACILITY[facility]  [facility]   PHYSICIAN:  Crist Fat. Rivard, M.D.              DATE OF BIRTH:  [birth year]   DATE OF ADMISSION:  02/13/2004  DATE OF DISCHARGE:  02/15/2004                                 DISCHARGE SUMMARY   DISCHARGE DIAGNOSES:  1. Symptomatic pelvic relaxation.  2. Cystocele.  3. Uterine prolapse.  4. Fibroid uterus.  5. Adenomyosis.   OPERATION:  On the date of admission the patient underwent a laparoscopic-  assisted vaginal hysterectomy with bilateral salpingo-oophorectomy,  tolerating all procedures well.  The patient was found to have what appeared  to be a normal-size uterus which was prolapsed approximately three-quarters  of the length of her vagina, a third degree cystocele, and normal-appearing  atrophic ovaries and normal appearing tubes.   HISTORY OF PRESENT ILLNESS:  Norma Barajas is a 70 year old married white female  gravida 1 para 1 who presents for hysterectomy because of symptomatic pelvic  relaxation.  Please see the patient's dictated History and Physical  Examination for details.   PREOPERATIVE PHYSICAL EXAMINATION:  VITAL SIGNS:  Blood pressure 130/80,  weight is 128 pounds, height 5 feet three-quarter inches tall.  GENERAL:  Within normal limits.  Do note on cardiac exam the patient's heart  was regular rate and rhythm with a 3/4 systolic murmur.  PELVIC:  External genitalia showing cystocele 3/4 with negative stress test,  no urethrocele or rectocele.  Cervix is normal.  Uterus is prolapsed three-  quarters the length of the vagina, appears normal size and shape.  Adnexa  are normal.  Rectovaginal exam is negative.   HOSPITAL COURSE:  On the date of admission the patient underwent  aforementioned procedures, tolerating them all well.  Postoperative course  was  unremarkable with the patient's postoperative hemoglobin being 13.1  (preoperative hemoglobin 15.4).  By postoperative day #2 the patient had  received the maximum benefit of her hospital stay and was discharged home  with a Foley leg bag along with instructions on how to empty and clamp that  device.   DISCHARGE MEDICATIONS:  1. Motrin liquid - the patient is to take as directed q.6h.  2. Vicodin one to two tablets q.4-6h. as needed for pain.  3. The patient was advised to resume her prehospital medications.   FOLLOW-UP:  The patient is scheduled for Foley removal at [facility] on February 20, 2004 at 9:30 a.m.  The patient was advised to clamp her  Foley bag 2 hours prior to her appointment.  She is scheduled to see Dr.  Silverio Lay on March 06, 2004 at 10:30 a.m. for her postoperative visit.   DISCHARGE INSTRUCTIONS:  The patient was given a copy of [address] [place]  [facility] postoperative instruction sheet.  She was further advised how to  empty and to clamp her Foley bag.  She was advised to avoid driving for 2  weeks, heavy lifting for 4 weeks, and intercourse for 6 weeks.  The  patient's diet was without restrictions.   FINAL PATHOLOGY:  Uterus, ovaries, and fallopian tubes:  Cervix:  Slightly  hypokeratosis and slight cervicitis, no dysplasia identified.  Atrophic to  weakly proliferative endometrium, no hyperplasia or malignancy identified.  Adenomyosis, leiomyoma (intramural), benign uterine serosa, no endometriosis  or malignancy identified.  Benign right and left tubes, no endometriosis or  evidence of malignancy.  Right and left fallopian tubes benign paratubal  cysts.     [place] J. Adline Peals.                    Crist Fat Rivard, M.D.    EJP/MEDQ  D:  02/25/2004  T:  02/26/2004  Job:  [id number]

## 2011-03-26 NOTE — H&P (Signed)
[place]. [facility]  Patient:    Norma Barajas, Norma Barajas                         MRN: [id number] Adm. Date:  12/25/99 Attending:  Daisey Must, M.D. [facility] CC:         Rollene Rotunda, M.D. [facility]             Bertram Millard. Hyacinth Meeker, M.D.             Cardiac Catheterization Laboratory                         History and Physical  PROCEDURES PERFORMED:  Right and left heart catheterization with coronary angiography and left ventriculography.  INDICATIONS:  Ms. Roehrs is a 70 year old woman who was admitted earlier this morning with chest pain.  Examination was consistent with mitral regurgitation.  Her followup troponin and CK-MBs were slightly elevated.  She had recurrent chest pain this afternoon unrelieved with nitroglycerin and also became acutely short of breath.  She was referred for emergent cardiac catheterization.  DESCRIPTION OF PROCEDURE:  A 7 French sheath was placed in the right femoral, 8  French sheath in the right femoral vein.  A standard Swan-Ganz catheter was utilized for the right heart catheterization and standard Judkins catheters were utilized for the left heart catheterization.  Left ventriculography was performed in both the RAO and LAO projections.  At the conclusion of the procedure, a Perclose vascular closure device was placed in the right femoral artery with good hemostasis.  There were no complications.  RESULTS:  HEMODYNAMICS:  Mean right atrial pressure 8.  Right ventricular pressure 34/8.  Pulmonary artery pressure 34/16.  Mean pulmonary capillary wedge pressures is 10 with a V wave of 12, left ventricular pressure 136/12, aortic pressure 118/84.  There is no aortic valve gradient.  Cardiac output by the thermodilution method was 3.0 with a cardiac index of 1.9.  LEFT VENTRICULOGRAM:  Left ventriculogram revealed mild akinesis of the posterobasal wall.  Ejection fraction is calculated at 59%.  There is 3-4+ moderately severe  mitral regurgitation.  CORONARY ARTERIOGRAPHY:  (Right dominant).  Left main:  Normal.  Left anterior descending:  The LAD has minor luminal irregularities in the proximal vessel but is otherwise normal.  It gives rise to a small first diagonal and normal sized second diagonal.  Left circumflex:  The left circumflex gives rise to a large OM-1, small OM-2, small OM-3 and a small OM-4.  Left circumflex is free of angiographic disease.  Right coronary artery:  The right coronary artery gives rise to a normal sized posterior descending artery and a small posterolateral branch.  The right coronary artery is free of angiographic disease.  IMPRESSION: 1. Normal right and left heart filling pressures with normal pulmonary artery    pressures. 2. Moderately decreased cardiac output but preserved left ventricular ejection    fraction with 3-4+ moderately severe mitral regurgitation. 3. Normal coronary arteries. DD:  12/27/99 TD:  12/28/99 Job: [id number]

## 2011-03-26 NOTE — Op Note (Signed)
[place]. [facility]  Patient:    Norma Barajas, Norma Barajas                          MRN: [id number] Proc. Date: 12/28/99 Adm. Date:  [id number] Attending:  Rollene Rotunda CC:         Rollene Rotunda, M.D. [facility]                           Operative Report  PROCEDURE:  Upper endoscopy.  INDICATIONS:  Mrs. Berne was admitted with chest pain, and cardiac catheterization was negative.  She has complaints of dysphagia.  Test is performed to rule out upper GI pathology and possible dilatation.  INFORMED CONSENT:  The patient provided consent after risks, benefits, and alternatives were explained.  MEDICATIONS:  Versed 5 mg IV, Robomol 0.2 mg, Fentanyl 35 mcg IV.  DESCRIPTION OF PROCEDURE:  The patient was placed in the left lateral decubitus  position, administered continuous low-flow oxygen, and was placed on pulse oximetry.  The Olympus video gastroscope was inserted under direct vision into the oropharynx and esophagus after spraying the throat with cetacaine spray.  FINDINGS: 1. In the distal one-third of the esophagus, there was diffusely eroded mucosa ith    areas of severe erythema. 2. There was concentric narrowing of the GE junction.  The 9.8 mm gastroscope    easily passed through the area. 3. Small hiatal hernia. 4. There were patchy areas of mild erythema in the duodenal bulb. 5. Normal proximal esophagus and stomach.  THERAPEUTICS:  With the scope in the distal stomach, a guidewire was place through the scope.  The scope was withdrawn.  Under fluoroscopic guidance, numbers 15, 16, and 17 mm Savary dilators were passed with mild resistance.  IMPRESSION: 1. Erosive esophagitis. 2. Esophageal stricture. 3. Duodenitis.  RECOMMENDATIONS: 1. Protonix 40 mg a day. 2. Repeat dilatation p.r.n. DD:  12/28/99 TD:  12/28/99 Job: [id number]

## 2011-03-26 NOTE — Discharge Summary (Signed)
Norma Barajas, Norma Barajas NO.:  [id number]   MEDICAL RECORD NO.:  [id number]          PATIENT TYPE:  INP   LOCATION:  2905                         FACILITY:  [facility]   PHYSICIAN:  Maple Mirza, P.A. DATE OF BIRTH:  02-Nov-1941   DATE OF ADMISSION:  10/31/2005  DATE OF DISCHARGE:  11/03/2005                                 DISCHARGE SUMMARY   This patient has an allergy to Morphine and sulfate, and also possible  sensitivity to contrast media.  She had Burning all over after  installation of IV dye at left heart catheterization November 02, 2005.   PRINCIPAL DIAGNOSES:  1.  Admitted October 31, 2005 with chest pain with both typical and      atypical features.  2.  Mildly elevated troponin-I studies.  They were 0.68, 0.36, and 0.19,      then 0.10.  3.  Left heart catheterization November 02, 2005.  The study shows ejection      fraction of 80%.  No significant mitral regurgitation, 20% left main,      20% LAD, mid to distal left circumflex and the right coronary artery      without evidence of obstructive disease.  4.  Echocardiogram, which was done on November 02, 2005.  Ejection fraction      estimated at 65%.  No left ventricular regional wall motion      abnormalities.  Mild mitral regurgitation.  Left atrium dilated.  Good      left ventricular function.  Once again, mitral regurgitation is only      mild at the time of this study.  5.  Prior catheterization February of 2001.  Ejection fraction 59%.  There      was 3 to 4+ mitral regurgitation.  The LAD had minor luminal      irregularities.  The left circumflex and the right coronary artery free      of disease.  6.  A computed tomogram of the chest was obtained to rule out pulmonary      embolism.   SECONDARY DIAGNOSES:  1.  Hypertension.  2.  History of esophageal stricture status post dilatation in February of      2001.  3.  Status post hysterectomy.  4.  Bilateral salpingo-oophorectomy.  5.   Anterior repair for a cystocele April of 2005.  6.  Decreased exercise tolerance for the last three weeks.  Previously the      patient could walk three miles that is stretched now only one mile and      has to rest.  7.  Increased lower edema in the last six months.   PROCEDURES THIS HOSPITALIZATION:  1.  Two-D echocardiogram, which was obtained November 02, 2005 with ejection      fraction of 65% with mild mitral regurgitation.  2.  Left heart catheterization November 02, 2005, Dr. Eden Emms.  The study      shows ejection fraction 80%, no disease in the right coronary artery or      the left circumflex.  The LAD had 20%  mid to distal irregularities, and      the main had 20% stenosis.  The patient had some complaining of burning      sensation in the back and All over after the procedure.  The patient      watched overnight and scheduled for a computed tomogram in the morning      of the chest.   BRIEF HISTORY:  [name] is a 70 year old female.  She has a history of  hypertension.  She had a catheterization in 2001 that showed normal left  ventricular function, and at least moderate if not moderate to severe mitral  regurgitation.  She also had normal coronary arteries seen at that time.  She has had no significant chest pain until the last few weeks when she  notes intermittent episodes, but she has not mentioned any family members.  Today, she began to experience substernal chest discomfort with some pain in  her back.  The patient denies nausea, vomiting, shortness of breath.  She  denies fevers, chills.  She has had some cough, which is non-productive.  She denies syncope.   PLAN:  To admit the patient to cycle cardiac enzymes, to obtain a D-dimer.  It is doubted, however, that this is an acute coronary syndrome.  She does  have mitral regurgitation, which is at least moderate if not severe, and she  is having essentially no heart failure symptoms.  A sediment rate will  also  be obtained.  She will be placed on IV nitroglycerin and Lovenox.  She will  also be started on a beta-blocker and continued with home medications.   HOSPITAL COURSE:  Patient presented October 31, 2005 with chest pain with  typical and atypical components.  It was improved on sublingual  nitroglycerin and by 24 hours in the hospital had improved greatly.  Troponin-I studies were mildly elevated.  The D-dimer was 0.24.  The patient  was scheduled for a left heart catheterization November 02, 2005 with  continuing no chest pain after admission at any time during this  hospitalization.  The echocardiogram was interesting in that it showed only  mild mitral regurgitation where the catheterization of February 2001 showed  3 to 4+ mitral regurgitation, moderate if not severe.  The patient's  coronary angiography shows coronaries without obstructive disease as  mentioned above with an ejection fraction of 80%.  The patient had some mild  burning all over after the dye was infused consistent with a possible  contrast media allergy.  The patient was scheduled for a computed tomogram  on Friday November 03, 2005 prior to discharge and received dye prophylaxis  prior to that study.  Patient discharging November 03, 2005.  The patient  may shower.  She is to call [phone] if she experiences pain, swelling, or  bleeding from her catheterization site.  She is asked not to lift anything  heavier than 10 pounds over the next two weeks.   DISCHARGE MEDICATIONS:  Those of her admission.  1.  Pepcid 20 mg daily.  2.  Atenolol 50 mg daily.  3.  Felodipine 5 mg daily.  4.  Zocor 20 mg daily at bedtime.  5.  Enteric coated aspirin 325 mg daily.  6.  Mucinex 30/600 as needed.  7.  __________  one tab daily.  8.  Antacid as needed.   FOLLOW UP:  She has follow-up at [facility] with Dr.  Antoine Poche December 29, 2005 at 1:15  p.m.  LABORATORY STUDIES THIS ADMISSION:  Troponin-I  studies and serial fashion  0.68, then 0.36, 0.19, and then 0.1.  D-dimer was 0.24.  Admission serum  electrolytes sodium 141, potassium 3.8, chloride 108, bicarbonate  26, BUN 15, creatinine 0.9, glucose 89.  Admission and complete blood counts  white cells 4.9, hemoglobin 13.2, hematocrit 37.1, platelets 151.  Admission  PT 13.9, INR 1.1, TSH was 2.297.   Spending 30 minutes on this.      Maple Mirza, P.A.     GM/MEDQ  D:  11/03/2005  T:  11/03/2005  Job:  [id number]   cc:   Rollene Rotunda, M.D.  [address]  [address]  [place]  [place] [address]   Magnus Sinning. Rice, M.D.  Fax: [phone]

## 2011-08-19 LAB — BASIC METABOLIC PANEL
CO2: 30
Chloride: 101
Creatinine, Ser: 0.88
GFR calc Af Amer: >60
Potassium: 4
Sodium: 138

## 2011-08-19 LAB — HEMOGLOBIN AND HEMATOCRIT, BLOOD: HCT: 41.8

## 2011-08-20 LAB — BASIC METABOLIC PANEL
Chloride: 102
GFR calc Af Amer: >60
Potassium: 4.2
Sodium: 139

## 2011-08-20 LAB — HEMOGLOBIN AND HEMATOCRIT, BLOOD
HCT: 41.9
Hemoglobin: 14.3

## 2011-10-20 ENCOUNTER — Encounter: Payer: Self-pay | Admitting: Cardiovascular Disease

## 2011-11-09 HISTORY — PX: OPEN REDUCTION INTERNAL FIXATION (ORIF) TIBIA/FIBULA FRACTURE: SHX5992

## 2012-09-28 ENCOUNTER — Ambulatory Visit (INDEPENDENT_AMBULATORY_CARE_PROVIDER_SITE_OTHER): Payer: Medicare PPO | Admitting: Gastroenterology

## 2012-09-28 ENCOUNTER — Encounter: Payer: Self-pay | Admitting: Gastroenterology

## 2012-09-28 ENCOUNTER — Inpatient Hospital Stay (HOSPITAL_COMMUNITY)
Admission: EM | Admit: 2012-09-28 | Discharge: 2012-09-30 | DRG: 310 | Disposition: A | Discharge status: Home / Self Care | Payer: Medicare PPO | Attending: Internal Medicine | Admitting: Internal Medicine

## 2012-09-28 ENCOUNTER — Emergency Department (HOSPITAL_COMMUNITY): Payer: Medicare PPO

## 2012-09-28 ENCOUNTER — Encounter (HOSPITAL_COMMUNITY): Payer: Self-pay | Admitting: *Deleted

## 2012-09-28 VITALS — BP 110/76 | HR 129 | Temp 97.3°F | Ht 62.0 in | Wt 133.8 lb

## 2012-09-28 DIAGNOSIS — R001 Bradycardia, unspecified: Secondary | ICD-10-CM

## 2012-09-28 DIAGNOSIS — K625 Hemorrhage of anus and rectum: Secondary | ICD-10-CM

## 2012-09-28 DIAGNOSIS — I519 Heart disease, unspecified: Secondary | ICD-10-CM | POA: Diagnosis present

## 2012-09-28 DIAGNOSIS — K219 Gastro-esophageal reflux disease without esophagitis: Secondary | ICD-10-CM | POA: Diagnosis present

## 2012-09-28 DIAGNOSIS — F411 Generalized anxiety disorder: Secondary | ICD-10-CM | POA: Diagnosis present

## 2012-09-28 DIAGNOSIS — D333 Benign neoplasm of cranial nerves: Secondary | ICD-10-CM

## 2012-09-28 DIAGNOSIS — K208 Other esophagitis without bleeding: Secondary | ICD-10-CM | POA: Diagnosis present

## 2012-09-28 DIAGNOSIS — I959 Hypotension, unspecified: Secondary | ICD-10-CM | POA: Diagnosis not present

## 2012-09-28 DIAGNOSIS — T46905A Adverse effect of unspecified agents primarily affecting the cardiovascular system, initial encounter: Secondary | ICD-10-CM | POA: Diagnosis not present

## 2012-09-28 DIAGNOSIS — I059 Rheumatic mitral valve disease, unspecified: Secondary | ICD-10-CM | POA: Diagnosis present

## 2012-09-28 DIAGNOSIS — R011 Cardiac murmur, unspecified: Secondary | ICD-10-CM

## 2012-09-28 DIAGNOSIS — Z90710 Acquired absence of both cervix and uterus: Secondary | ICD-10-CM

## 2012-09-28 DIAGNOSIS — I9589 Other hypotension: Secondary | ICD-10-CM | POA: Diagnosis not present

## 2012-09-28 DIAGNOSIS — I498 Other specified cardiac arrhythmias: Secondary | ICD-10-CM | POA: Diagnosis not present

## 2012-09-28 DIAGNOSIS — R1310 Dysphagia, unspecified: Secondary | ICD-10-CM

## 2012-09-28 DIAGNOSIS — I499 Cardiac arrhythmia, unspecified: Secondary | ICD-10-CM

## 2012-09-28 DIAGNOSIS — I10 Essential (primary) hypertension: Secondary | ICD-10-CM | POA: Diagnosis present

## 2012-09-28 DIAGNOSIS — Z91041 Radiographic dye allergy status: Secondary | ICD-10-CM

## 2012-09-28 DIAGNOSIS — I4891 Unspecified atrial fibrillation: Principal | ICD-10-CM | POA: Diagnosis present

## 2012-09-28 DIAGNOSIS — R911 Solitary pulmonary nodule: Secondary | ICD-10-CM

## 2012-09-28 DIAGNOSIS — Z885 Allergy status to narcotic agent status: Secondary | ICD-10-CM

## 2012-09-28 DIAGNOSIS — E785 Hyperlipidemia, unspecified: Secondary | ICD-10-CM | POA: Diagnosis present

## 2012-09-28 DIAGNOSIS — I080 Rheumatic disorders of both mitral and aortic valves: Secondary | ICD-10-CM | POA: Diagnosis present

## 2012-09-28 DIAGNOSIS — Z79899 Other long term (current) drug therapy: Secondary | ICD-10-CM

## 2012-09-28 DIAGNOSIS — R9431 Abnormal electrocardiogram [ECG] [EKG]: Secondary | ICD-10-CM

## 2012-09-28 DIAGNOSIS — M810 Age-related osteoporosis without current pathological fracture: Secondary | ICD-10-CM

## 2012-09-28 DIAGNOSIS — I2510 Atherosclerotic heart disease of native coronary artery without angina pectoris: Secondary | ICD-10-CM | POA: Diagnosis present

## 2012-09-28 DIAGNOSIS — Z888 Allergy status to other drugs, medicaments and biological substances status: Secondary | ICD-10-CM

## 2012-09-28 HISTORY — DX: Other ill-defined heart diseases: I51.89

## 2012-09-28 LAB — LIPID PANEL
Cholesterol: 146 mg/dL (ref 0–200)
HDL: 52 mg/dL (ref >39)
Total CHOL/HDL Ratio: 2.8 RATIO
Triglycerides: 110 mg/dL (ref <150)

## 2012-09-28 LAB — CBC WITH DIFFERENTIAL/PLATELET
Basophils Relative: 0 % (ref 0–1)
Hemoglobin: 15.8 g/dL — ABNORMAL HIGH (ref 12.0–15.0)
Lymphs Abs: 2.4 10*3/uL (ref 0.7–4.0)
Monocytes Relative: 10 % (ref 3–12)
Neutro Abs: 4.9 10*3/uL (ref 1.7–7.7)
Neutrophils Relative %: 59 % (ref 43–77)
Platelets: 201 10*3/uL (ref 150–400)
RBC: 5.27 MIL/uL — ABNORMAL HIGH (ref 3.87–5.11)
WBC: 8.2 10*3/uL (ref 4.0–10.5)

## 2012-09-28 LAB — COMPREHENSIVE METABOLIC PANEL
ALT: 16 U/L (ref 0–35)
Albumin: 4.3 g/dL (ref 3.5–5.2)
Alkaline Phosphatase: 89 U/L (ref 39–117)
BUN: 14 mg/dL (ref 6–23)
Chloride: 99 mEq/L (ref 96–112)
Glucose, Bld: 130 mg/dL — ABNORMAL HIGH (ref 70–99)
Potassium: 3.9 mEq/L (ref 3.5–5.1)
Sodium: 137 mEq/L (ref 135–145)
Total Bilirubin: 0.3 mg/dL (ref 0.3–1.2)

## 2012-09-28 LAB — TROPONIN I
Troponin I: <0.3 ng/mL (ref <0.30)
Troponin I: <0.3 ng/mL (ref <0.30)

## 2012-09-28 LAB — MRSA PCR SCREENING: MRSA by PCR: NEGATIVE

## 2012-09-28 MED ORDER — SIMVASTATIN 20 MG PO TABS
20.0000 mg | ORAL_TABLET | Freq: Every day | ORAL | Status: DC
  Administered 2012-09-28 – 2012-09-29 (×2): 20 mg via ORAL
  Filled 2012-09-28 (×2): qty 1

## 2012-09-28 MED ORDER — HYDROCODONE-ACETAMINOPHEN 5-325 MG PO TABS: 1.0000 | ORAL_TABLET | ORAL | Status: DC | PRN

## 2012-09-28 MED ORDER — ALPRAZOLAM 0.25 MG PO TABS
0.2500 mg | ORAL_TABLET | Freq: Two times a day (BID) | ORAL | Status: DC | PRN
  Administered 2012-09-29: 0.25 mg via ORAL
  Filled 2012-09-28: qty 1

## 2012-09-28 MED ORDER — ENOXAPARIN SODIUM 40 MG/0.4ML ~~LOC~~ SOLN
40.0000 mg | SUBCUTANEOUS | Status: DC
  Administered 2012-09-28 – 2012-09-29 (×2): 40 mg via SUBCUTANEOUS
  Filled 2012-09-28 (×2): qty 0.4

## 2012-09-28 MED ORDER — ACETAMINOPHEN 650 MG RE SUPP: 650.0000 mg | Freq: Four times a day (QID) | RECTAL | Status: DC | PRN

## 2012-09-28 MED ORDER — DILTIAZEM HCL 100 MG IV SOLR
5.0000 mg/h | INTRAVENOUS | Status: DC
  Administered 2012-09-28 – 2012-09-29 (×2): 5 mg/h via INTRAVENOUS

## 2012-09-28 MED ORDER — ONDANSETRON HCL 4 MG/2ML IJ SOLN: 4.0000 mg | Freq: Four times a day (QID) | INTRAMUSCULAR | Status: DC | PRN

## 2012-09-28 MED ORDER — DILTIAZEM HCL 100 MG IV SOLR
5.0000 mg/h | INTRAVENOUS | Status: DC
  Administered 2012-09-28: 5 mg/h via INTRAVENOUS
  Filled 2012-09-28: qty 100

## 2012-09-28 MED ORDER — SUCRALFATE 1 GM/10ML PO SUSP: 1.0000 g | Freq: Four times a day (QID) | ORAL | Status: DC

## 2012-09-28 MED ORDER — DEXLANSOPRAZOLE 60 MG PO CPDR: 60.0000 mg | DELAYED_RELEASE_CAPSULE | Freq: Every day | ORAL | Status: DC

## 2012-09-28 MED ORDER — ASPIRIN EC 325 MG PO TBEC
325.0000 mg | DELAYED_RELEASE_TABLET | Freq: Every day | ORAL | Status: DC
  Administered 2012-09-28 – 2012-09-30 (×3): 325 mg via ORAL
  Filled 2012-09-28 (×3): qty 1

## 2012-09-28 MED ORDER — SUCRALFATE 1 GM/10ML PO SUSP
1.0000 g | Freq: Three times a day (TID) | ORAL | Status: DC
  Administered 2012-09-28 – 2012-09-30 (×7): 1 g via ORAL
  Filled 2012-09-28 (×7): qty 10

## 2012-09-28 MED ORDER — DILTIAZEM HCL 25 MG/5ML IV SOLN
10.0000 mg | Freq: Once | INTRAVENOUS | Status: AC
  Administered 2012-09-28 (×3): 10 mg via INTRAVENOUS
  Filled 2012-09-28: qty 5

## 2012-09-28 MED ORDER — ONDANSETRON HCL 4 MG PO TABS: 4.0000 mg | ORAL_TABLET | Freq: Four times a day (QID) | ORAL | Status: DC | PRN

## 2012-09-28 MED ORDER — PANTOPRAZOLE SODIUM 40 MG PO TBEC
80.0000 mg | DELAYED_RELEASE_TABLET | Freq: Two times a day (BID) | ORAL | Status: DC
  Administered 2012-09-28 – 2012-09-30 (×4): 80 mg via ORAL
  Filled 2012-09-28: qty 1
  Filled 2012-09-28 (×2): qty 2
  Filled 2012-09-28: qty 1
  Filled 2012-09-28: qty 2

## 2012-09-28 MED ORDER — DILTIAZEM HCL 25 MG/5ML IV SOLN: 10.0000 mg | Freq: Once | INTRAVENOUS | Status: DC

## 2012-09-28 MED ORDER — SODIUM CHLORIDE 0.9 % IV SOLN
INTRAVENOUS | Status: DC
  Administered 2012-09-28 – 2012-09-29 (×2): via INTRAVENOUS
  Administered 2012-09-30: 1000 mL via INTRAVENOUS

## 2012-09-28 MED ORDER — ACETAMINOPHEN 325 MG PO TABS: 650.0000 mg | ORAL_TABLET | Freq: Four times a day (QID) | ORAL | Status: DC | PRN

## 2012-09-28 MED ORDER — ATENOLOL 25 MG PO TABS
50.0000 mg | ORAL_TABLET | Freq: Two times a day (BID) | ORAL | Status: DC
  Administered 2012-09-28 – 2012-09-29 (×2): 50 mg via ORAL
  Filled 2012-09-28: qty 2
  Filled 2012-09-28 (×2): qty 1

## 2012-09-28 NOTE — Progress Notes (Signed)
Primary Care Physician:  Rudi Heap, MD Primary Gastroenterologist:  Dr. Darrick Penna   Chief Complaint  Patient presents with  . Dysphagia  . Heartburn    HPI:    Referred by [facility] secondary to dysphagia and abdominal pain. Notes epigastric burning, esophageal burning. +H.pylori serologies. Started on Prevpac, couldn't complete due to "nervousness", states "redness" in face. Took about 7 total days but not consecutive. +dysphagia for awhile, "careful about what eating". +solid food and pill dysphagia. Cereal gets hung easily. No wt loss. Decreased appetite. Taking Prilosec BID. Failed Protonix in the past. Irregular bowels. Dealing more with constipation. No rectal bleeding. No NSAIDs or aspirin powders. No melena.   Last EGD 2001 with esophageal stricture, s/p dilation by Dr. Arlyce Dice.  Last TCS in 2008, due for screening again in 2018.     Past Medical History  Diagnosis Date  . Hypertension   . Mitral regurgitation   . Erosive esophagitis   . Chest pain   . GERD (gastroesophageal reflux disease)   . Hypercholesterolemia     Past Surgical History  Procedure Date  . Abdominal hysterectomy   . Prolapsed uterine fibroid ligation   . Cataract extraction   . Steel plates in right leg     broke tibia and fibula after falling down stairs  . Esophagogastroduodenoscopy 2001    Dr. Arlyce Dice: erosive esophagitis, esophageal stricture, duodenitis, s/p Savary dilation  . Colonoscopy 2008    Dr. Darrick Penna: internal hemorrhoids     Current Outpatient Prescriptions  Medication Sig Dispense Refill  . ALPRAZolam (XANAX) 0.25 MG tablet Take 0.25 mg by mouth. 1 bid prn       . amLODipine (NORVASC) 10 MG tablet Take 10 mg by mouth daily.        Marland Kitchen amoxicillin (AMOXIL) 500 MG capsule Take 500 mg by mouth 2 (two) times daily.      Marland Kitchen atenolol (TENORMIN) 50 MG tablet Take 50 mg by mouth daily. 2 qam       . calcium citrate-vitamin D 200-200 MG-UNIT TABS Take 1 tablet by mouth daily.         . clarithromycin (BIAXIN) 500 MG tablet Take 500 mg by mouth 2 (two) times daily.      . fish oil-omega-3 fatty acids 1000 MG capsule Take 1 capsule by mouth daily.        . lansoprazole (PREVACID) 30 MG capsule Take 30 mg by mouth 2 (two) times daily.      . Multiple Vitamin (MULTIVITAMIN) capsule Take 1 capsule by mouth daily.        Marland Kitchen omeprazole (PRILOSEC) 20 MG capsule Take 20 mg by mouth daily.        . simvastatin (ZOCOR) 20 MG tablet Take 20 mg by mouth at bedtime.        Marland Kitchen dexlansoprazole (DEXILANT) 60 MG capsule Take 1 capsule (60 mg total) by mouth daily.  30 capsule  3  . felodipine (PLENDIL) 5 MG 24 hr tablet Take 5 mg by mouth daily.        . sucralfate (CARAFATE) 1 GM/10ML suspension Take 10 mLs (1 g total) by mouth 4 (four) times daily.  420 mL  0    Allergies as of 09/28/2012 - Review Complete 09/28/2012  Allergen Reaction Noted  . Crestor (rosuvastatin calcium) Other (See Comments) 02/18/2011  . Iodine  02/18/2011  . Iohexol  11/03/2005  . Morphine    . Risedronate sodium Other (See Comments) 02/18/2011  . Triamterene-hctz Other (See  Comments) 02/18/2011    Family History  Problem Relation Age of Onset  . Colon cancer Mother     diagnosed at age 42    History   Social History  . Marital Status: Married    Spouse Name: N/A    Number of Children: N/A  . Years of Education: N/A   Occupational History  . Retired     Press photographer   Social History Main Topics  . Smoking status: Never Smoker   . Smokeless tobacco: Not on file  . Alcohol Use: No  . Drug Use: No  . Sexually Active: Not on file   Other Topics Concern  . Not on file   Social History Narrative   MarriedNo regular exercise    Review of Systems: Gen: SEE HPI  CV: +palpitations Resp: Denies shortness of breath at rest or with exertion. Denies wheezing or cough.  GI: SEE HPI GU : Denies urinary burning, urinary frequency, urinary hesitancy MS: Denies joint pain, muscle weakness, cramps, or  limitation of movement.  Derm: Denies rash, itching, dry skin Psych: +anxiety Heme: Denies bruising, bleeding, and enlarged lymph nodes.  Physical Exam: BP 110/76  Pulse 129  Temp 97.3 F (36.3 C) (Temporal)  Ht 5\' 2"  (1.575 m)  Wt 133 lb 12.8 oz (60.691 kg)  BMI 24.47 kg/m2 ON RECHECK OF BP manually, 100/60. TACHYCARDIC IN 120s via machine. I PERSONALLY ATTEMPTED TO COUNT PULSE VIA APICAL PULSE, UNABLE TO OBTAIN ACCURATE PULSE.  General:   Alert and oriented. Pleasant and cooperative. Well-nourished and well-developed.  Head:  Normocephalic and atraumatic. Eyes:  Without icterus, sclera clear and conjunctiva pink.  Ears:  Normal auditory acuity. Nose:  No deformity, discharge,  or lesions. Mouth:  No deformity or lesions, oral mucosa pink.  Neck:  Supple, without mass or thyromegaly. Lungs:  Clear to auscultation bilaterally. No wheezes, rales, or rhonchi. No distress.  Heart: tachycardic, irregularly irregular rhythm Abdomen:  +BS, soft, non-tender and non-distended. No HSM noted. No guarding or rebound. No masses appreciated.  Rectal:  Deferred  Msk: kyphosis Extremities:  Without clubbing or edema. Neurologic:  Alert and  oriented x4;  grossly normal neurologically. Skin:  Intact without significant lesions or rashes. Cervical Nodes:  No significant cervical adenopathy. Psych:  Alert and cooperative. Normal mood and affect.

## 2012-09-28 NOTE — H&P (Signed)
[facility] History and Physical  AZHA CONSTANTIN ZOX:[id number] DOB: 1941/05/30 DOA: 09/28/2012  Referring physician: Dr. Estell Harpin PCP: Rudi Heap, MD  Specialists: [facility]  Chief Complaint: palpitations  HPI: Norma Barajas is a 71 y.o. female with a history of hypertension and hyperlipidemia.  She was at the gastroenterologist office today for evaluation of GERD and dysphagia symptoms, when it was noticed that patient had irregular heart rhythm.  Her rate was found to be greater than 130, and blood pressure was on the lower side in 100s.  She was sent to the ED for evaluation and found to be in A fib with RVR.  Patient reports noticing intermittent episodes of palpitations over the last 6 months.  These episodes are worse at night and usually last 2-3 hours before resolving on their own.  She does experience frequent epigastric discomfort, but attributes this to her GERD symptoms.  She denies any shortness of breath, nausea or vomiting, diarrhea, dysuria, fever or cough.  Review of Systems: Pertinent positives as per HPI, otherwise negative  Past Medical History  Diagnosis Date  . Hypertension   . Mitral regurgitation   . Erosive esophagitis   . Chest pain   . GERD (gastroesophageal reflux disease)   . Hypercholesterolemia    Past Surgical History  Procedure Date  . Abdominal hysterectomy   . Prolapsed uterine fibroid ligation   . Cataract extraction   . Steel plates in right leg     broke tibia and fibula after falling down stairs  . Esophagogastroduodenoscopy 2001    Dr. Arlyce Dice: erosive esophagitis, esophageal stricture, duodenitis, s/p Savary dilation  . Colonoscopy 2008    Dr. Darrick Penna: internal hemorrhoids    Social History:  reports that she has never smoked. She does not have any smokeless tobacco history on file. She reports that she does not drink alcohol or use illicit drugs. Lives at home with her husband, independent with  ADLs  Allergies  Allergen Reactions  . Crestor (Rosuvastatin Calcium) Other (See Comments)    weakness  . Iodine     ivp dye  . Iohexol   . Morphine   . Risedronate Sodium Other (See Comments)    ACTONEL REACTION: reflux  . Triamterene-Hctz Other (See Comments)    weakness    Family History  Problem Relation Age of Onset  . Colon cancer Mother     diagnosed at age 69     Prior to Admission medications   Medication Sig Start Date End Date Taking? Authorizing Provider  ALPRAZolam (XANAX) 0.25 MG tablet Take 0.25 mg by mouth 2 (two) times daily as needed. For anxiety/nerves   Yes Historical Provider, MD  amLODipine (NORVASC) 10 MG tablet Take 10 mg by mouth daily with lunch.    Yes Historical Provider, MD  atenolol (TENORMIN) 50 MG tablet Take 50 mg by mouth 2 (two) times daily.    Yes Historical Provider, MD  calcium citrate-vitamin D 200-200 MG-UNIT TABS Take 1 tablet by mouth every morning.    Yes Historical Provider, MD  dexlansoprazole (DEXILANT) 60 MG capsule Take 60 mg by mouth every morning. 09/28/12  Yes Nira Retort, NP  felodipine (PLENDIL) 5 MG 24 hr tablet Take 5 mg by mouth daily.     Yes Historical Provider, MD  fish oil-omega-3 fatty acids 1000 MG capsule Take 1 capsule by mouth daily.     Yes Historical Provider, MD  Multiple Vitamin (MULTIVITAMIN) capsule Take 1 capsule by mouth every  morning.    Yes Historical Provider, MD  omeprazole (PRILOSEC) 20 MG capsule Take 20 mg by mouth 2 (two) times daily.    Yes Historical Provider, MD  simvastatin (ZOCOR) 20 MG tablet Take 20 mg by mouth at bedtime.     Yes Historical Provider, MD  sucralfate (CARAFATE) 1 GM/10ML suspension Take 10 mLs (1 g total) by mouth 4 (four) times daily. 09/28/12   Nira Retort, NP   Physical Exam: Filed Vitals:   09/28/12 1630 09/28/12 1700 09/28/12 1715 09/28/12 1745  BP:  130/73    Pulse: 128 136 128 134  Temp:      TempSrc:      Resp: 17 19 18 20   Height:      Weight:      SpO2: 94%  92% 93% 91%     General:  NAD, laying comfortably in bed  Eyes: PERRLA, EOMI  ENT: mucous membranes are moist  Neck: supple  Cardiovascular: s1,s2 irregular, no pedal edema  Respiratory: cta b  Abdomen: soft, nt, bs+  Skin: normal  Musculoskeletal: deferred  Psychiatric: normal affect, cooperative with exam  Neurologic: grossly intact, nonfocal  Labs on Admission:  Basic Metabolic Panel:  Lab 09/28/12 9562  NA 137  K 3.9  CL 99  CO2 26  GLUCOSE 130*  BUN 14  CREATININE 0.83  CALCIUM 9.7  MG --  PHOS --   Liver Function Tests:  Lab 09/28/12 1630  AST 25  ALT 16  ALKPHOS 89  BILITOT 0.3  PROT 8.1  ALBUMIN 4.3   No results found for this basename: LIPASE:5,AMYLASE:5 in the last 168 hours No results found for this basename: AMMONIA:5 in the last 168 hours CBC:  Lab 09/28/12 1630  WBC 8.2  NEUTROABS 4.9  HGB 15.8*  HCT 46.2*  MCV 87.7  PLT 201   Cardiac Enzymes:  Lab 09/28/12 1630  CKTOTAL --  CKMB --  CKMBINDEX --  TROPONINI <0.30    BNP (last 3 results) No results found for this basename: PROBNP:3 in the last 8760 hours CBG: No results found for this basename: GLUCAP:5 in the last 168 hours  Radiological Exams on Admission: Dg Chest Portable 1 View  09/28/2012  *RADIOLOGY REPORT*  Clinical Data: 71 year old female with palpitations.  Hypertension.  PORTABLE CHEST - 1 VIEW  Comparison: 02/01/2012 and earlier.  Findings: Portable semi upright AP view at  1630 hours.  Lower lung volumes.  Stable mild elevation of the right hemidiaphragm.  Mild increased linear opacity at the left lung base most resembles atelectasis.  No pneumothorax or edema.  No pleural effusion or consolidation.  Cardiac size and mediastinal contours are within normal limits.  IMPRESSION: Lower lung volumes with mild left basilar atelectasis.   Original Report Authenticated By: [name], M.D.     EKG: Independently reviewed. A fib with  RVR  Assessment/Plan Principal Problem:  *Atrial fibrillation with RVR Active Problems:  HYPERLIPIDEMIA  ANXIETY  HYPERTENSION  GERD   1. A fib with RVR.  New onset.  Patient will be continued on cardizem infusion.  She will be monitored in the step down unit. We will continue atenolol for now and discontinue amlodipine.  We will convert to oral cardizem once her heart rate is under better control. Will check cardiac enzymes, 2D echo, Thyroid studies. Her CHADS2 score is 1 at this time.  Will also check A1C to further risk stratify her.  At this time, she does not meet criteria for  initiation of anticoagulation.  Will start her on full dose aspirin 2. GERD.  She will need GI evaluation with endoscopy.  This can likely be done as an outpatient. She will be continued on PPI and carafate. 3. HTN.  Controlled 4. Anxiety.  Continue xanax. 5. Hyperlipidemia, check lipid panel.   Code Status: full code Family Communication: discussed with patient and husband at bedside Disposition Plan: return home once medically improved  Time spent:  MEMON,JEHANZEB [facility] Pager [phone]  If 7PM-7AM, please contact night-coverage [email or web address] Password [facility] 09/28/2012, 6:17 PM

## 2012-09-28 NOTE — Patient Instructions (Addendum)
Start taking Dexilant daily. Stop Prilosec.  Please go to the emergency room due to your fast heart rate and low blood pressure. It is important you have this checked out.   We will be contacting you with an appointment for the upper endoscopy shortly.

## 2012-09-28 NOTE — Assessment & Plan Note (Signed)
71 year old female with routine visit for dysphagia, hx of H.pylori positive serologies. BP at visit 110/76 with pulse in upper 120s. Upon auscultation, irregularly irregular heart rate. Pt confirms sensation of palpitations. Notes intermittent in nature. Most recent EKG from PCP with NSR. I called Dr. Kathi Der office with [facility] and spoke with the triage nurse. States no prior hx of irregular heart beat such as afib, aflutter, etc noted in chart. She does have a history of mitral regurgitation, and there is a vague reference to palpitations in Dr. Fabio Bering note in the remote past. Recheck of BP 100/60. Upon review of prior records, SBP usually in the 140s-150s. I discussed at length with patient and husband this new finding and my concern. We do not have an EKG machine. It is difficult to know what rhythm she is currently in, and I told them this. Due to falling SBP and new onset, I told them to present to the ED for further evaluation. Husband was not agreeable to this at first, stating he would go to the PCP. He is worried about the co-pay. The wife states "Well I'm worried about dying". I called and spoke to the triage nurse at [facility]. She requested to speak with the patient and husband on the phone. After speaking to triage nurse, they have agreed to seek medical attention.

## 2012-09-28 NOTE — ED Provider Notes (Signed)
History   This chart was scribed for Norma Lennert, MD by Gerlean Ren, ED Scribe. This patient was seen in room APA19/APA19 and the patient's care was started at 4:00 PM    CSN: [id number]  Arrival date & time 09/28/12  1547   First MD Initiated Contact with Patient 09/28/12 1559      Chief Complaint  Patient presents with  . Palpitations    Patient is a 71 y.o. female presenting with palpitations. The history is provided by the patient (the pt states her gi md noticed she had a rapid rate to her heart). No language interpreter was used.  Palpitations  This is a new problem. The current episode started 3 to 5 hours ago. The problem occurs constantly. The problem has not changed since onset.Associated with: nothing. Pertinent negatives include no diaphoresis, no chest pain, no abdominal pain, no headaches, no back pain and no cough.   Norma Barajas is a 71 y.o. female who presents to the Emergency Department complaining of palpitations and an irregular heartbeat noticed earlier today when pt presented to gastroenterologist for 2 months pf constant burning pain in esophagus.  Pt reports at times it feels like she is choking and will spit up white phlegm, but denies any persistent chest pain, cough, sweating or dyspnea.  Pt denies any h/o palpitations and reports last EKG was regular.  Pt has h/o HTN, mitral regurgitation, and GERD.  Pt denies tobacco and alcohol use.   Past Medical History  Diagnosis Date  . Hypertension   . Mitral regurgitation   . Erosive esophagitis   . Chest pain   . GERD (gastroesophageal reflux disease)   . Hypercholesterolemia     Past Surgical History  Procedure Date  . Abdominal hysterectomy   . Prolapsed uterine fibroid ligation   . Cataract extraction   . Steel plates in right leg     broke tibia and fibula after falling down stairs  . Esophagogastroduodenoscopy 2001    Dr. Arlyce Dice: erosive esophagitis, esophageal stricture, duodenitis, s/p Savary  dilation  . Colonoscopy 2008    Dr. Darrick Penna: internal hemorrhoids     Family History  Problem Relation Age of Onset  . Colon cancer Mother     diagnosed at age 62    History  Substance Use Topics  . Smoking status: Never Smoker   . Smokeless tobacco: Not on file  . Alcohol Use: No    No OB history provided.  Review of Systems  Constitutional: Negative for diaphoresis and fatigue.  HENT: Negative for congestion, sinus pressure and ear discharge.   Eyes: Negative for discharge.  Respiratory: Negative for cough.   Cardiovascular: Positive for palpitations. Negative for chest pain.  Gastrointestinal: Negative for abdominal pain and diarrhea.  Genitourinary: Negative for frequency and hematuria.  Musculoskeletal: Negative for back pain.  Skin: Negative for rash.  Neurological: Negative for seizures and headaches.  Hematological: Negative.   Psychiatric/Behavioral: Negative for hallucinations.    Allergies  Crestor; Iodine; Iohexol; Morphine; Risedronate sodium; and Triamterene-hctz  Home Medications   Current Outpatient Rx  Name  Route  Sig  Dispense  Refill  . ALPRAZOLAM 0.25 MG PO TABS   Oral   Take 0.25 mg by mouth. 1 bid prn          . AMLODIPINE BESYLATE 10 MG PO TABS   Oral   Take 10 mg by mouth daily.           . AMOXICILLIN 500 MG  PO CAPS   Oral   Take 500 mg by mouth 2 (two) times daily.         . ATENOLOL 50 MG PO TABS   Oral   Take 50 mg by mouth daily. 2 qam          . CALCIUM CITRATE-VITAMIN D 200-200 MG-UNIT PO TABS   Oral   Take 1 tablet by mouth daily.           Marland Kitchen CLARITHROMYCIN 500 MG PO TABS   Oral   Take 500 mg by mouth 2 (two) times daily.         . DEXLANSOPRAZOLE 60 MG PO CPDR   Oral   Take 1 capsule (60 mg total) by mouth daily.   30 capsule   3   . FELODIPINE ER 5 MG PO TB24   Oral   Take 5 mg by mouth daily.           . OMEGA-3 FATTY ACIDS 1000 MG PO CAPS   Oral   Take 1 capsule by mouth daily.             Marland Kitchen LANSOPRAZOLE 30 MG PO CPDR   Oral   Take 30 mg by mouth 2 (two) times daily.         . MULTIVITAMINS PO CAPS   Oral   Take 1 capsule by mouth daily.           Marland Kitchen OMEPRAZOLE 20 MG PO CPDR   Oral   Take 20 mg by mouth daily.           Marland Kitchen SIMVASTATIN 20 MG PO TABS   Oral   Take 20 mg by mouth at bedtime.           . SUCRALFATE 1 GM/10ML PO SUSP   Oral   Take 10 mLs (1 g total) by mouth 4 (four) times daily.   420 mL   0     BP 132/90  Temp 98 F (36.7 C) (Oral)  Resp 20  Ht 5\' 2"  (1.575 m)  Wt 133 lb (60.328 kg)  BMI 24.33 kg/m2  SpO2 95%  Physical Exam  Nursing note and vitals reviewed. Constitutional: She is oriented to person, place, and time. She appears well-developed.  HENT:  Head: Normocephalic and atraumatic.  Eyes: Conjunctivae normal and EOM are normal. No scleral icterus.  Neck: Neck supple. No thyromegaly present.  Cardiovascular:       Rapid irregular heartbeat.  Pulmonary/Chest: No stridor. She has no wheezes. She has no rales. She exhibits no tenderness.  Abdominal: She exhibits no distension. There is no tenderness. There is no rebound.  Musculoskeletal: Normal range of motion. She exhibits no edema.  Lymphadenopathy:    She has no cervical adenopathy.  Neurological: She is oriented to person, place, and time. Coordination normal.  Skin: No rash noted. No erythema.  Psychiatric: She has a normal mood and affect. Her behavior is normal.    ED Course  Procedures (including critical care time) DIAGNOSTIC STUDIES: Oxygen Saturation is 95% on room air, adequate by my interpretation.    COORDINATION OF CARE: 4:03 PM- Patient informed of clinical course, understands medical decision-making process, and agrees with plan.  Ordered IV Cardizem, troponin, CBC, c-met, and chest XR  Labs Reviewed - No data to display No results found.   No diagnosis found. CRITICAL CARE Performed by: Naima Veldhuizen L   Total critical care time:  35  Critical care time was exclusive  of separately billable procedures and treating other patients.  Critical care was necessary to treat or prevent imminent or life-threatening deterioration.  Critical care was time spent personally by me on the following activities: development of treatment plan with patient and/or surrogate as well as nursing, discussions with consultants, evaluation of patient's response to treatment, examination of patient, obtaining history from patient or surrogate, ordering and performing treatments and interventions, ordering and review of laboratory studies, ordering and review of radiographic studies, pulse oximetry and re-evaluation of patient's condition.    MDM   The chart was scribed for me under my direct supervision.  I personally performed the history, physical, and medical decision making and all procedures in the evaluation of this patient.Norma Lennert, MD 09/28/12 2156

## 2012-09-28 NOTE — Progress Notes (Signed)
Faxed to PCP

## 2012-09-28 NOTE — ED Notes (Signed)
Reports onset of heart "flip flopping" this morning.  Was seen at gastroenterologist today for appt to have esophagus stretched.  Was sent by office for eval of palpitations and irregular heartbeat.

## 2012-09-28 NOTE — ED Notes (Signed)
Pt states is feeling worse. HR sustained in 120's to 130's. Dr Estell Harpin notified. Verbal order for 10 mg bolus of Cardizem. HR 81 after bolus.

## 2012-09-28 NOTE — Assessment & Plan Note (Signed)
Hx of esophageal stricture with last EGD by Dr. Arlyce Dice in 2001. Notes solid food and pill dysphagia. +epigastric and esophageal sensation of "burning". +H.pylori serologies with failed Prevpac due to side effects. Needs EGD/ED in near future. I have changed PPI to Dexilant. Tried Protonix and Prilosec in past; currently taking Prilosec BID. Short course of Carafate. Sent to pharmacy. Will schedule EGD after evaluation for irregular heart rate.   Proceed with upper endoscopy and dilation in the near future with Dr. Darrick Penna. The risks, benefits, and alternatives have been discussed in detail with patient. They have stated understanding and desire to proceed.  Will need to be cleared from cardiac standpoint prior.

## 2012-09-28 NOTE — ED Notes (Signed)
Pt presents with 'c/o heart feeling like it is racing". Pt is A-fib on the monitor. PIV started and Cardizem drip to be started. Pt states is feeling worse.  Pt denies n/v and no diaphoresis noted. Pt states is cold. Warm blanket given.

## 2012-09-28 NOTE — Plan of Care (Signed)
Problem: Consults Goal: Atrial Arhythmia Patient Education (See Patient Education module for education specifics.)  Outcome: Progressing Pt  was at Dr Darrick Penna & during this visit was told to come to ED for evaluation . Pt was in A-fib in 130's. Pt was placed on cardizem drip & admitted to ICU. Pt HR 70's on 5mg /hr of cardizem. Goal: Tobacco Cessation referral if indicated Outcome: Completed/Met Date Met:  09/28/12 Pt never has smoked  Problem: Phase I Progression Outcomes Goal: Anticoagulation Therapy per MD order Outcome: Progressing Pt is only on lovenox

## 2012-09-28 NOTE — ED Notes (Signed)
Patient walk to restroom with assist, tolerated well. Norma Barajas

## 2012-09-29 ENCOUNTER — Encounter (HOSPITAL_COMMUNITY): Payer: Self-pay | Admitting: Internal Medicine

## 2012-09-29 DIAGNOSIS — R001 Bradycardia, unspecified: Secondary | ICD-10-CM | POA: Diagnosis not present

## 2012-09-29 DIAGNOSIS — I959 Hypotension, unspecified: Secondary | ICD-10-CM

## 2012-09-29 DIAGNOSIS — I498 Other specified cardiac arrhythmias: Secondary | ICD-10-CM

## 2012-09-29 DIAGNOSIS — I059 Rheumatic mitral valve disease, unspecified: Secondary | ICD-10-CM

## 2012-09-29 DIAGNOSIS — I080 Rheumatic disorders of both mitral and aortic valves: Secondary | ICD-10-CM

## 2012-09-29 LAB — CBC
MCV: 88.2 fL (ref 78.0–100.0)
Platelets: 195 10*3/uL (ref 150–400)
RBC: 4.67 MIL/uL (ref 3.87–5.11)
RDW: 13.9 % (ref 11.5–15.5)
WBC: 7.4 10*3/uL (ref 4.0–10.5)

## 2012-09-29 LAB — TROPONIN I: Troponin I: <0.3 ng/mL (ref <0.30)

## 2012-09-29 LAB — BASIC METABOLIC PANEL
CO2: 28 mEq/L (ref 19–32)
Calcium: 8.8 mg/dL (ref 8.4–10.5)
Creatinine, Ser: 0.8 mg/dL (ref 0.50–1.10)
GFR calc Af Amer: 83 mL/min — ABNORMAL LOW (ref >90)
Sodium: 139 mEq/L (ref 135–145)

## 2012-09-29 LAB — HEMOGLOBIN A1C
Hgb A1c MFr Bld: 5.6 % (ref <5.7)
Mean Plasma Glucose: 114 mg/dL (ref <117)

## 2012-09-29 MED ORDER — ATENOLOL 25 MG PO TABS
25.0000 mg | ORAL_TABLET | Freq: Two times a day (BID) | ORAL | Status: DC
  Administered 2012-09-29 – 2012-09-30 (×2): 25 mg via ORAL
  Filled 2012-09-29 (×2): qty 1

## 2012-09-29 MED ORDER — AMIODARONE HCL 200 MG PO TABS
400.0000 mg | ORAL_TABLET | Freq: Two times a day (BID) | ORAL | Status: DC
  Administered 2012-09-29 (×2): 400 mg via ORAL
  Filled 2012-09-29 (×2): qty 2

## 2012-09-29 NOTE — Progress Notes (Signed)
E-link RN & MD updated on pt converting from A-fib to Sinus Brady/sinus rhythm. BP=89/45& cardizem drip has been on 5mg /hr since arrival to ICU & is now off per E-link.

## 2012-09-29 NOTE — Progress Notes (Signed)
UR Chart Review Completed  

## 2012-09-29 NOTE — Consult Note (Signed)
CARDIOLOGY CONSULT NOTE  Patient ID: Norma Barajas MRN: [id number] DOB/AGE: 73/73/1942 73 y.o.  Admit date: 09/28/2012 Referring Physician: [facility] Primary Aletha Halim, MD Primary Cardiologist: Dr. Charlton Haws Reason for Consultation: Afib with RVR  HPI: Norma Barajas is a 71 y/o patient of Dr. Eden Emms who has not been seen in our office since 2011. Norma Barajas has a history of hypertension, hyperlipidemia, non-obstructive CAD per cardiac cath in 2011, and palpitations. Norma Barajas presented to ER after being seen by Norma Barajas GI physician where Norma Barajas has ongoing treatment for GERD, and found to have irregular rapid heart rhythm. In ER Heart rate was 129 bpm, BP 110/76. Norma Barajas was afebrile. Potassium 3.9. Creatinine .83. CXR demonstrated no evidence of pneumonia or CHF. Norma Barajas was treated with diltiazem 10 mg bolus and started on a drip. Norma Barajas subsequently converted to NSR at 1:30 am with hypotensive response-89/45, HR 50 bpm. Diltiazem was discontinued. Cardiac enzymes were found to be negative X 3. We are asked for further cardiac recommendations. Echocardiogram has been ordered by [facility] and is pending.  Norma Barajas states that Norma Barajas has been medically compliant, but has been under a lot of stress for the last year as Norma Barajas son has had a CVA and is currently in a nursing home undergoing rehabilitation. Norma Barajas states that Norma Barajas nerves are bad and Norma Barajas is constantly worrying about Norma Barajas son. Norma Barajas has not followed up with [place] secondary to Norma Barajas family issues. Norma Barajas also states that Norma Barajas GERD symptoms have been prominent for Norma Barajas, and has been recently diagnosed with H. pylori but was unable to tolerate the treatment.    Review of systems complete and found to be negative unless listed above   Past Medical History  Diagnosis Date  . Hypertension   . Mitral regurgitation     Mild 2011 with moderate LAD  . Erosive esophagitis   . GERD (gastroesophageal reflux disease)   . Hypercholesterolemia   . History of cardiac catheterization December 2006   Nonobstructive CAD    Family History  Problem Relation Age of Onset  . Colon cancer Mother     diagnosed at age 43    History   Social History  . Marital Status: Married    Spouse Name: N/A    Number of Children: N/A  . Years of Education: N/A   Occupational History  . Retired     Press photographer   Social History Main Topics  . Smoking status: Never Smoker   . Smokeless tobacco: Not on file  . Alcohol Use: No  . Drug Use: No  . Sexually Active: Not on file   Other Topics Concern  . Not on file   Social History Narrative   MarriedNo regular exercise    Past Surgical History  Procedure Date  . Abdominal hysterectomy   . Prolapsed uterine fibroid ligation   . Cataract extraction   . Steel plate in the leg     broke tibia and fibula after falling down stairs  . Esophagogastroduodenoscopy 2001    Dr. Arlyce Dice: erosive esophagitis, esophageal stricture, duodenitis, s/p Savary dilation  . Colonoscopy 2008    Dr. Darrick Penna: internal hemorrhoids      Prescriptions prior to admission  Medication Sig Dispense Refill  . ALPRAZolam (XANAX) 0.25 MG tablet Take 0.25 mg by mouth 2 (two) times daily as needed. For anxiety/nerves      . amLODipine (NORVASC) 10 MG tablet Take 10 mg by mouth daily with lunch.       Marland Kitchen atenolol (TENORMIN) 50  MG tablet Take 50 mg by mouth 2 (two) times daily.       . calcium citrate-vitamin D 200-200 MG-UNIT TABS Take 1 tablet by mouth every morning.       Marland Kitchen dexlansoprazole (DEXILANT) 60 MG capsule Take 60 mg by mouth every morning.      . felodipine (PLENDIL) 5 MG 24 hr tablet Take 5 mg by mouth daily.        . fish oil-omega-3 fatty acids 1000 MG capsule Take 1 capsule by mouth daily.        . Multiple Vitamin (MULTIVITAMIN) capsule Take 1 capsule by mouth every morning.       Marland Kitchen omeprazole (PRILOSEC) 20 MG capsule Take 20 mg by mouth 2 (two) times daily.       . simvastatin (ZOCOR) 20 MG tablet Take 20 mg by mouth at bedtime.        . sucralfate (CARAFATE) 1  GM/10ML suspension Take 10 mLs (1 g total) by mouth 4 (four) times daily.  420 mL  0   Cardiac Catheterization : 11/02/2005 Left main coronary had 20% discrete stenosis.  Left anterior descending artery had 20% multiple discrete stenoses in the  mid and distal portion. First and second diagonal branches were normal.  Circumflex coronary artery was normal.  Right coronary artery was dominant and normal.  RAO VENTRICULOGRAPHY: RAO ventriculography showed hyperdynamic LV function.  EF was 80%. There was no gradient across the aortic valve. No MR. Aortic  pressure was in the 170/84 range. LV pressure was in the 170/17 range.  RAO ventriculography also showed no significant dilatation or dissection of  the proximal aortic root.   Echocardiogram: 04/28/2010 Left ventricle: Basal septal hypertrophy The cavity size was normal. Wall thickness was increased in a pattern of mild LVH. - Mitral valve: Mild regurgitation. - Left atrium: The atrium was moderately dilated. - Right atrium: The atrium was mildly dilated. - Atrial septum: No defect or patent foramen ovale was identified. - Tricuspid valve: Mild-moderate regurgitation. - Pulmonary arteries: PA peak pressure: 37mm Hg (S).   Physical Exam: Blood pressure 109/63, pulse 64, temperature 98.4 F (36.9 C), temperature source Oral, resp. rate 21, height 5\' 2"  (1.575 m), weight 132 lb 0.9 oz (59.9 kg), SpO2 96.00%.   General: Well developed, well nourished, in no acute distress Head: Eyes PERRLA, No xanthomas.   Normal cephalic and atramatic  Lungs: Clear bilaterally to auscultation and percussion. Heart: HRRR S1 S2, with soft systolic murmur. Pulses are 2+ & equal.            No carotid bruit. No JVD.  No abdominal bruits. No femoral bruits. Abdomen: Bowel sounds are positive, abdomen soft and non-tender without masses or                  Hernia's noted. Msk:  Back kyphosis noted, normal gait. Normal strength and tone for age. Extremities:  No clubbing, cyanosis or edema.  DP +1 Neuro: Alert and oriented X 3. Psych:  Good affect, responds appropriately  Labs:   Lab Results  Component Value Date   WBC 7.4 09/29/2012   HGB 14.2 09/29/2012   HCT 41.2 09/29/2012   MCV 88.2 09/29/2012   PLT 195 09/29/2012     Lab 09/29/12 0201 09/28/12 1630  NA 139 --  K 3.8 --  CL 102 --  CO2 28 --  BUN 13 --  CREATININE 0.80 --  CALCIUM 8.8 --  PROT -- 8.1  BILITOT --  0.3  ALKPHOS -- 89  ALT -- 16  AST -- 25  GLUCOSE 98 --   Lab Results  Component Value Date   TROPONINI <0.30 09/29/2012    Lab Results  Component Value Date   CHOL 146 09/28/2012   Lab Results  Component Value Date   HDL 52 09/28/2012   Lab Results  Component Value Date   LDLCALC 72 09/28/2012   Lab Results  Component Value Date   TRIG 110 09/28/2012   Lab Results  Component Value Date   CHOLHDL 2.8 09/28/2012     Radiology: Dg Chest Portable 1 View  09/28/2012  *RADIOLOGY REPORT*  Clinical Data: 71 year old female with palpitations.  Hypertension.  PORTABLE CHEST - 1 VIEW  Comparison: 02/01/2012 and earlier.  Findings: Portable semi upright AP view at  1630 hours.  Lower lung volumes.  Stable mild elevation of the right hemidiaphragm.  Mild increased linear opacity at the left lung base most resembles atelectasis.  No pneumothorax or edema.  No pleural effusion or consolidation.  Cardiac size and mediastinal contours are within normal limits.  IMPRESSION: Lower lung volumes with mild left basilar atelectasis.   Original Report Authenticated By: [name], M.D.    EKG:09/29/2012 Sinus rhythm with Premature supraventricular complexes Otherwise normal ECG  09/28/2012 Atrial fibrillation with rapid ventricular response with premature ventricular or aberrantly conducted complexes Nonspecific ST and T wave abnormality  ASSESSMENT AND PLAN:   1. PAF: Norma Barajas has returned to normal sinus rhythm with use of Cardizem. Norma Barajas is currently on atenolol 50  mg twice a day, and aspirin. CHADs score of 1-2, secondary to hypertension and age 64 at this point. Echocardigram has been ordered.   2. CAD: Norma Barajas has a history of non-obstructive CAD per previous cath. Negative cardiac enzymes. No plans for cardiac stress test while in hospital.   Signed: Bettey Mare. Lyman Bishop NP [facility] 09/29/2012, 10:43 AM Co-Sign MD   Attending note:  Patient seen and examined. Reviewed records, database, and above documentation which was modified based on my review. Patient presents with history of hypertension, previously documented mild nonobstructive CAD, and long-standing history of intermittent palpitations which have been more frequent and prolonged over the last year by report. Norma Barajas has no prior documented cardiac arrhythmia, recently found to be in rapid atrial fibrillation, converted to sinus rhythm with rate control on Cardizem, although also associated with bradycardia and hypotension with concurrent use of beta blocker. CHADS2 score is 1-2 based on current parameters. Norma Barajas has been on aspirin and atenolol consistently per report. Last office visit with Dr. Eden Emms was in 2011, Norma Barajas husband also sees him.  At ECG reviewed showing nonspecific ST changes, mainly during RVR, normal cardiac markers. Norma Barajas is currently hemodynamically stable with heart rate in the 60s and sinus rhythm, blood pressure 119/63, afebrile. Lungs are clear, cardiac exam reveals regular rate and rhythm without significant murmur, there is no peripheral edema. Lab work reviewed.  At this point would recommend continuing aspirin, reduce atenolol to 25 mg twice daily, and initiate amiodarone with  loading dose of 400 mg twice daily for 2 weeks, then down to 200 mg a day. We will followup on the echocardiogram already obtained. Norma Barajas will also need baseline TSH and LFTs. Would also consider an outpatient cardiac monitor, 7 days at a minimum, to better document frequency of recurring atrial  fibrillation, also assess for any significant bradycardia. Norma Barajas will need to be reestablish with Dr. Eden Emms, can see him in  the [place] office (Norma Barajas preference), and have further discussion at that time. I did discuss with Norma Barajas briefly indications for anticoagulation which will ultimately need to be considered. For now we will keep Norma Barajas on aspirin.  Jonelle Sidle, M.D., F.A.C.C.

## 2012-09-29 NOTE — Progress Notes (Signed)
Per Dr. Diona Browner. Patient will come by [place] office for placement of cardiac monitor on Monday.  She will follow up with Dr. Eden Emms in [place] in two weeks. We have made appointments for both things and are in the discharge follow appointments in [facility]

## 2012-09-29 NOTE — Progress Notes (Signed)
*  PRELIMINARY RESULTS* Echocardiogram 2D Echocardiogram has been performed.  Norma Barajas 09/29/2012, 1:58 PM

## 2012-09-29 NOTE — Progress Notes (Signed)
Subjective: The patient has no complaints of chest pain, palpitations, or shortness of breath. Events noted for relative hypotension and bradycardia on a Cardizem drip overnight. Therefore, the Cardizem drip was discontinued.  Objective: Vital signs in last 24 hours: Filed Vitals:   09/29/12 0500 09/29/12 0556 09/29/12 0600 09/29/12 0720  BP: 96/41  96/50   Pulse: 56  60   Temp:  98.2 F (36.8 C)  98.4 F (36.9 C)  TempSrc:    Oral  Resp: 19  21   Height:      Weight:  59.9 kg (132 lb 0.9 oz)    SpO2: 95%  95%     Intake/Output Summary (Last 24 hours) at 09/29/12 4098 Last data filed at 09/29/12 0600  Gross per 24 hour  Intake 539.58 ml  Output    700 ml  Net -160.42 ml    Weight change:   Physical exam: General: Pleasant elderly 71 year old Caucasian woman sitting up in bed, in no acute distress. Heart: S1, S2, with a 2/6 systolic murmur. Lungs: Clear to auscultation bilaterally. Abdomen: Positive bowel sounds, soft, nontender, nondistended. Extremities: No pedal edema. Neurologic: She is alert and oriented x3. Cranial nerves II through XII are intact.  Lab Results: Basic Metabolic Panel:  Basename 09/29/12 0201 09/28/12 1630  NA 139 137  K 3.8 3.9  CL 102 99  CO2 28 26  GLUCOSE 98 130*  BUN 13 14  CREATININE 0.80 0.83  CALCIUM 8.8 9.7  MG -- --  PHOS -- --   Liver Function Tests:  Basename 09/28/12 1630  AST 25  ALT 16  ALKPHOS 89  BILITOT 0.3  PROT 8.1  ALBUMIN 4.3   No results found for this basename: LIPASE:2,AMYLASE:2 in the last 72 hours No results found for this basename: AMMONIA:2 in the last 72 hours CBC:  Basename 09/29/12 0201 09/28/12 1630  WBC 7.4 8.2  NEUTROABS -- 4.9  HGB 14.2 15.8*  HCT 41.2 46.2*  MCV 88.2 87.7  PLT 195 201   Cardiac Enzymes:  Basename 09/29/12 0154 09/28/12 2017 09/28/12 1630  CKTOTAL -- -- --  CKMB -- -- --  CKMBINDEX -- -- --  TROPONINI <0.30 <0.30 <0.30   BNP: No results found for this basename:  PROBNP:3 in the last 72 hours D-Dimer: No results found for this basename: DDIMER:2 in the last 72 hours CBG: No results found for this basename: GLUCAP:6 in the last 72 hours Hemoglobin A1C: No results found for this basename: HGBA1C in the last 72 hours Fasting Lipid Panel:  Basename 09/28/12 2019  CHOL 146  HDL 52  LDLCALC 72  TRIG 110  CHOLHDL 2.8  LDLDIRECT --   Thyroid Function Tests: No results found for this basename: TSH,T4TOTAL,FREET4,T3FREE,THYROIDAB in the last 72 hours Anemia Panel: No results found for this basename: VITAMINB12,FOLATE,FERRITIN,TIBC,IRON,RETICCTPCT in the last 72 hours Coagulation: No results found for this basename: LABPROT:2,INR:2 in the last 72 hours Urine Drug Screen: Drugs of Abuse  No results found for this basename: labopia, cocainscrnur, labbenz, amphetmu, thcu, labbarb    Alcohol Level: No results found for this basename: ETH:2 in the last 72 hours Urinalysis: No results found for this basename: COLORURINE:2,APPERANCEUR:2,LABSPEC:2,PHURINE:2,GLUCOSEU:2,HGBUR:2,BILIRUBINUR:2,KETONESUR:2,PROTEINUR:2,UROBILINOGEN:2,NITRITE:2,LEUKOCYTESUR:2 in the last 72 hours Misc. Labs:  EKG: SINUS RHYTHM WITH PREMATURE SUPRAVENTRICULAR COMPLEXES, HEART RATE 61 BEATS PER MINUTE.  Micro: Recent Results (from the past 240 hour(s))  MRSA PCR SCREENING     Status: Normal   Collection Time   09/28/12  7:48 PM  Component Value Range Status Comment   MRSA by PCR NEGATIVE  NEGATIVE Final     Studies/Results: Dg Chest Portable 1 View  09/28/2012  *RADIOLOGY REPORT*  Clinical Data: 71 year old female with palpitations.  Hypertension.  PORTABLE CHEST - 1 VIEW  Comparison: 02/01/2012 and earlier.  Findings: Portable semi upright AP view at  1630 hours.  Lower lung volumes.  Stable mild elevation of the right hemidiaphragm.  Mild increased linear opacity at the left lung base most resembles atelectasis.  No pneumothorax or edema.  No pleural effusion or  consolidation.  Cardiac size and mediastinal contours are within normal limits.  IMPRESSION: Lower lung volumes with mild left basilar atelectasis.   Original Report Authenticated By: [name], M.D.     Medications:  Scheduled:   . aspirin EC  325 mg Oral Daily  . atenolol  50 mg Oral BID  . [COMPLETED] diltiazem  10 mg Intravenous Once  . diltiazem  10 mg Intravenous Once  . enoxaparin (LOVENOX) injection  40 mg Subcutaneous Q24H  . pantoprazole  80 mg Oral BID AC  . simvastatin  20 mg Oral QHS  . sucralfate  1 g Oral TID AC & HS   Continuous:   . sodium chloride 50 mL/hr at 09/29/12 0600  . [DISCONTINUED] diltiazem (CARDIZEM) infusion 5 mg/hr (09/28/12 1645)  . [DISCONTINUED] diltiazem (CARDIZEM) infusion Stopped (09/29/12 0200)   GEX:[id number], acetaminophen, ALPRAZolam, HYDROcodone-acetaminophen, ondansetron (ZOFRAN) IV, ondansetron  Assessment: Principal Problem:  *Atrial fibrillation with RVR Active Problems:  HYPERLIPIDEMIA  ANXIETY  MITRAL REGURGITATION  HYPERTENSION  GERD  Hypotension  Bradycardia   This is a 71 year old woman with a history of non-obstructive CAD and mitral regurgitation, who presented with atrial fibrillation with rapid ventricular response. Following a Cardizem drip, she has converted to normal sinus rhythm. She also became relatively hypotensive and bradycardia, likely secondary to the Cardizem drip. She is treated with atenolol chronically for hypertension. She is hemodynamically stable. Her troponin I is negative x3. Her lipid profile is within normal limits. Her electrolytes are normal. TSH and 2-D echocardiogram are pending. We'll continue aspirin as ordered.  Plan:  1. As above. 2. [facility] consult cardiology for further recommendations regarding management. 3. Continue atenolol with parameters to hold it for heart rate of less than 57 beats per minute. 4. Check the results of TSH and 2-D echocardiogram pending. 5. Consider discharge  today following cardiology's assessment.   LOS: 1 day   Norma Barajas 09/29/2012, 8:11 AM

## 2012-09-29 NOTE — Progress Notes (Signed)
[facility] notified by phone of consult @0815 .

## 2012-09-29 NOTE — Care Management Note (Signed)
    Page 1 of 1   09/29/2012     3:56:32 PM   CARE MANAGEMENT NOTE 09/29/2012  Patient:  Norma Barajas, Norma Barajas   Account Number:  [id number]  Date Initiated:  09/29/2012  Documentation initiated by:  Rosemary Holms  Subjective/Objective Assessment:   Pt admitted from home with spouse. No HH needs identified. DC home.     Action/Plan:   Anticipated DC Date:  09/30/2012   Anticipated DC Plan:  HOME/SELF CARE      DC Planning Services  CM consult      Choice offered to / List presented to:             Status of service:  Completed, signed off Medicare Important Message given?   (If response is "NO", the following Medicare IM given date fields will be blank) Date Medicare IM given:   Date Additional Medicare IM given:    Discharge Disposition:  HOME/SELF CARE  Per UR Regulation:    If discussed at Long Length of Stay Meetings, dates discussed:    Comments:  09/29/12 Rosemary Holms RN BSN CM

## 2012-09-29 NOTE — Progress Notes (Signed)
Patient NSR. Rate 50's. Hypotension. Stopped Cardizem drip. May need low dose oral Cardizem in AM if rate increases or evaluation for SSS. Continue to monitor in Stepdown.

## 2012-09-30 ENCOUNTER — Encounter (HOSPITAL_COMMUNITY): Payer: Self-pay | Admitting: Internal Medicine

## 2012-09-30 LAB — HEPATIC FUNCTION PANEL
ALT: 11 U/L (ref 0–35)
AST: 15 U/L (ref 0–37)
Alkaline Phosphatase: 72 U/L (ref 39–117)
Bilirubin, Direct: 0.1 mg/dL (ref 0.0–0.3)
Indirect Bilirubin: 0.2 mg/dL — ABNORMAL LOW (ref 0.3–0.9)
Total Bilirubin: 0.3 mg/dL (ref 0.3–1.2)

## 2012-09-30 LAB — TSH: TSH: 1.331 u[IU]/mL (ref 0.350–4.500)

## 2012-09-30 MED ORDER — ATENOLOL 50 MG PO TABS: 25.0000 mg | ORAL_TABLET | Freq: Two times a day (BID) | ORAL | Status: DC

## 2012-09-30 MED ORDER — ASPIRIN 325 MG PO TBEC: 325.0000 mg | DELAYED_RELEASE_TABLET | Freq: Every day | ORAL | Status: DC

## 2012-09-30 MED ORDER — AMIODARONE HCL 200 MG PO TABS
200.0000 mg | ORAL_TABLET | Freq: Two times a day (BID) | ORAL | Status: DC
  Administered 2012-09-30: 200 mg via ORAL
  Filled 2012-09-30: qty 1

## 2012-09-30 MED ORDER — ASPIRIN 81 MG PO TBEC: 81.0000 mg | DELAYED_RELEASE_TABLET | Freq: Every day | ORAL | Status: DC

## 2012-09-30 MED ORDER — AMIODARONE HCL 200 MG PO TABS: 200.0000 mg | ORAL_TABLET | Freq: Two times a day (BID) | ORAL | Status: DC

## 2012-09-30 NOTE — Discharge Summary (Signed)
Physician Discharge Summary  Norma Barajas LKG:[id number] DOB: 10-Feb-1941 DOA: 09/28/2012  PCP: Rudi Heap, MD  Admit date: 09/28/2012 Discharge date: 09/30/2012  Time spent: Greater than 30 minutes  Recommendations for Outpatient Follow-up:  1. The patient will followup at the [facility] cardiology office for placement of a cardiac monitor on 10/02/2012. 2. She will followup with cardiologist, Dr. Eden Emms in 2 weeks as scheduled. 3. She was instructed to not take amiodarone and atenolol if heart rate fell below 55 beats per minute.  Discharge Diagnoses:  1. Paroxysmal atrial fibrillation with rapid ventricular response. The diagnosis. 2. Sinus bradycardia, secondary to her rate limiting medications. 3. Transient hypotension secondary to Cardizem drip. Resolved. 4. Grade 1 diastolic dysfunction per 2-D echocardiogram. 5. Mild mitral valve regurgitation. 6. Hypertension. 7. Hyperlipidemia. The patient's total cholesterol is 146, triglycerides 110, HDL cholesterol 52, and LDL cholesterol 72. 8. History of erosive esophagitis and gastroesophageal reflux disease.  Discharge Condition: Improved and stable.  Diet recommendation: Heart healthy.  Filed Weights   09/28/12 1950 09/29/12 0556 09/30/12 0500  Weight: 59.9 kg (132 lb 0.9 oz) 59.9 kg (132 lb 0.9 oz) 60 kg (132 lb 4.4 oz)    History of present illness:  The patient is a 71 year old woman with a history significant for nonobstructive coronary artery disease, hypertension, and erosive esophagitis, who presented to the emergency department on 09/28/2012 with a chief complaint of palpitations. In the emergency department, she was tachycardic with a heart rate between 125 and 130 beats per minute. Her blood pressure was within normal limits. Her EKG revealed atrial fibrillation, nonspecific ST and T-wave abnormalities, heart rate of 124 beats per minute. Her lab data were significant for normal troponin I. and hemoglobin of 15.8.  She was admitted for further evaluation and management.  Hospital Course:  The patient was given a bolus of IV diltiazem in the emergency department. He was subsequently admitted and started on a Cardizem drip in the step down unit. Atenolol was continued but amlodipine was pearly withheld. She was maintained on all of her other chronic medications. For further evaluation, a number of studies were ordered. Her troponin I was negative x4. Her TSH was within normal limits at 1.13. Her hemoglobin A1c was 5.6, within normal limits. The results of her fasting lipid profile were dictated above. Her followup EKG revealed a heart rate of 61 beats per minute, normal sinus rhythm, and premature supraventricular complexes. Her 2-D echocardiogram revealed preserved left ventricle systolic function, grade 1 diastolic dysfunction, and mild mitral valve regurgitation.  During the first 24 hours, the patient's heart rate fell to the lower 50s and her blood pressure fell into the 80s systolically. Therefore, the Cardizem drip was discontinued.  Cardiology was consulted. Dr. Diona Browner provided the consultation, assessment, and recommendations. He recommended continuing aspirin, but to reduce atenolol to 25 mg twice a day, decreased from 50 mg twice a day and to initiate amiodarone with a loading dose of 400 mg twice daily for 2 weeks and then down to 200 mg daily. Her liver transaminases were assessed, and they were all within normal limits. He arranged for the patient to followup in the [facility] cardiology office on 10/02/2012 for a cardiac monitor to be worn in the outpatient setting. An appointment for her to followup with her primary cardiologist Dr. Eden Emms in 2 weeks was also scheduled.   The patient converted to normal sinus rhythm, but was primarily in sinus bradycardia prior to discharge. For the most part, her  heart rate remained in the lower 50s the night and morning before discharge. Therefore, the dose  of amiodarone was decreased to 200 mg twice a day upon discharge. She was instructed to also continue the lower dose of atenolol at 25 mg twice a day. She was instructed to check her heart rate before taking both medications and if her heart rate was below 55 beats per minute, to not take them. Otherwise she would take them as prescribed. Because of her history of erosive esophagitis and resolution of the atrial fibrillation, the dose of aspirin was decreased from 325 mg daily to 81 mg daily. She ambulated in the hallway and her heart rate with ambulation ranged from 65-75 beats per minute.      Procedures: 2-D echocardiogram-Study Conclusions  - Left ventricle: The cavity size was normal. Wall thickness was normal. Systolic function was normal. The estimated ejection fraction was in the range of 60% to 65%. Wall motion was normal; there were no regional wall motion abnormalities. Doppler parameters are consistent with abnormal left ventricular relaxation (grade 1 diastolic dysfunction). - Mitral valve: Mild regurgitation. - Left atrium: The atrium was mildly dilated. - Right atrium: The Eustachian valve appeared prominant. - Atrial septum: There was increased thickness of the septum, consistent with lipomatous hypertrophy. No defect or patent foramen ovale was identified. - Tricuspid valve: Mild regurgitation. - Pulmonary arteries: PA peak pressure: 34mm Hg (S). - Pericardium, extracardiac: There was no pericardial effusion. Transthoracic echocardiography. M-mode, complete 2D, spectral Doppler, and color Doppler. Height: Height: 157.5cm. Height: 62in. Weight: Weight: 59.9kg. Weight: 131.7lb. Body mass index: BMI: 24.1kg/m^2. Body surface area: BSA: 1.49m^2. Patient status: Inpatient. Location: ICU/CCU    Consultations:  Nona Dell, M.D.   Discharge Exam: Filed Vitals:   09/30/12 0400 09/30/12 0500 09/30/12 0748 09/30/12 1134  BP: 103/57   113/70  Pulse: 57   59  Temp:  98.6 F (37 C)  98.2 F (36.8 C) 98.2 F (36.8 C)  TempSrc: Oral  Oral Oral  Resp: 18   18  Height:      Weight:  60 kg (132 lb 4.4 oz)    SpO2: 93%   96%    General: 71 year old Caucasian woman sitting up in bed, in no acute distress. Cardiovascular: S1, S2, with borderline bradycardia. Respiratory: Clear to auscultation bilaterally.  Discharge Instructions  Discharge Orders    Future Appointments: Provider: Department: Dept Phone: Center:   10/02/2012 10:00 AM [facility] Nurse [facility] [place] [phone] [facility]   10/18/2012 4:15 PM Wendall Stade, MD [facility] Main Office [place]) [phone] [facility]     Future Orders Please Complete By Expires   Diet - low sodium heart healthy      Increase activity slowly      Discharge instructions      Comments:   CHECK YOUR PULSE RATE BEFORE TAKING AMIODARONE AND ATENOLOL. IF YOUR PULSE RATE IS LESS THAN 55 DO NOT TAKE EITHER MEDICINE.       Medication List     As of 09/30/2012 12:21 PM    STOP taking these medications         felodipine 5 MG 24 hr tablet   Commonly known as: PLENDIL      TAKE these medications         ALPRAZolam 0.25 MG tablet   Commonly known as: XANAX   Take 0.25 mg by mouth 2 (two) times daily as needed. For anxiety/nerves      amiodarone 200 MG  tablet   Commonly known as: PACERONE   Take 1 tablet (200 mg total) by mouth 2 (two) times daily.      amLODipine 10 MG tablet   Commonly known as: NORVASC   Take 10 mg by mouth daily with lunch.      aspirin 81 MG EC tablet   Take 1 tablet (81 mg total) by mouth daily. Swallow whole.      atenolol 50 MG tablet   Commonly known as: TENORMIN   Take 0.5 tablets (25 mg total) by mouth 2 (two) times daily. The dose has been changed to half of a tablet twice daily.      calcium citrate-vitamin D 200-200 MG-UNIT Tabs   Take 1 tablet by mouth every morning.      dexlansoprazole 60 MG capsule   Commonly known as:  DEXILANT   Take 60 mg by mouth every morning.      fish oil-omega-3 fatty acids 1000 MG capsule   Take 1 capsule by mouth daily.      multivitamin capsule   Take 1 capsule by mouth every morning.      omeprazole 20 MG capsule   Commonly known as: PRILOSEC   Take 20 mg by mouth 2 (two) times daily.      simvastatin 20 MG tablet   Commonly known as: ZOCOR   Take 20 mg by mouth at bedtime.      sucralfate 1 GM/10ML suspension   Commonly known as: CARAFATE   Take 10 mLs (1 g total) by mouth 4 (four) times daily.           Follow-up Information    Follow up with Charlton Haws, MD. On 10/18/2012. (4:14 pm)    Contact information:   [address] [place] [address] [phone]       Follow up with [facility] [place]. On 10/02/2012. (10 am Placement of cardiac monitor)    Contact information:   [address] [place] [address]           The results of significant diagnostics from this hospitalization (including imaging, microbiology, ancillary and laboratory) are listed below for reference.    Significant Diagnostic Studies: Dg Chest Portable 1 View  09/28/2012  *RADIOLOGY REPORT*  Clinical Data: 71 year old female with palpitations.  Hypertension.  PORTABLE CHEST - 1 VIEW  Comparison: 02/01/2012 and earlier.  Findings: Portable semi upright AP view at  1630 hours.  Lower lung volumes.  Stable mild elevation of the right hemidiaphragm.  Mild increased linear opacity at the left lung base most resembles atelectasis.  No pneumothorax or edema.  No pleural effusion or consolidation.  Cardiac size and mediastinal contours are within normal limits.  IMPRESSION: Lower lung volumes with mild left basilar atelectasis.   Original Report Authenticated By: [name], M.D.     Microbiology: Recent Results (from the past 240 hour(s))  MRSA PCR SCREENING     Status: Normal   Collection Time   09/28/12  7:48 PM       Component Value Range Status Comment   MRSA by PCR NEGATIVE  NEGATIVE Final      Labs: Basic Metabolic Panel:  Lab 09/29/12 2130 09/28/12 1630  NA 139 137  K 3.8 3.9  CL 102 99  CO2 28 26  GLUCOSE 98 130*  BUN 13 14  CREATININE 0.80 0.83  CALCIUM 8.8 9.7  MG -- --  PHOS -- --  Liver Function Tests:  Lab 09/30/12 0439 09/28/12 1630  AST 15 25  ALT 11 16  ALKPHOS 72 89  BILITOT 0.3 0.3  PROT 6.8 8.1  ALBUMIN 3.4* 4.3   No results found for this basename: LIPASE:5,AMYLASE:5 in the last 168 hours No results found for this basename: AMMONIA:5 in the last 168 hours CBC:  Lab 09/29/12 0201 09/28/12 1630  WBC 7.4 8.2  NEUTROABS -- 4.9  HGB 14.2 15.8*  HCT 41.2 46.2*  MCV 88.2 87.7  PLT 195 201   Cardiac Enzymes:  Lab 09/29/12 0719 09/29/12 0154 09/28/12 2017 09/28/12 1630  CKTOTAL -- -- -- --  CKMB -- -- -- --  CKMBINDEX -- -- -- --  TROPONINI <0.30 <0.30 <0.30 <0.30   BNP: BNP (last 3 results) No results found for this basename: PROBNP:3 in the last 8760 hours CBG: No results found for this basename: GLUCAP:5 in the last 168 hours     Signed:  Cypher Paule  [facility] 09/30/2012, 12:21 PM

## 2012-09-30 NOTE — Progress Notes (Signed)
Pt ambulated in hallway with staff. Pt tolerated well, no complaints of dyspnea or pain. HR remains in the 60s.

## 2012-09-30 NOTE — Progress Notes (Signed)
Pt discharged to home with husband. D/c instructions given regarding follow-up appointments, diet, activity, and medications. IV removed by nurse tech. No s+s of infection. Pt told to report any signs of infection to PCP.

## 2012-10-02 ENCOUNTER — Ambulatory Visit: Payer: Self-pay | Admitting: Gastroenterology

## 2012-10-02 ENCOUNTER — Other Ambulatory Visit: Payer: Self-pay | Admitting: *Deleted

## 2012-10-02 DIAGNOSIS — I4891 Unspecified atrial fibrillation: Secondary | ICD-10-CM

## 2012-10-18 ENCOUNTER — Ambulatory Visit (INDEPENDENT_AMBULATORY_CARE_PROVIDER_SITE_OTHER): Payer: Medicare PPO | Admitting: Cardiovascular Disease

## 2012-10-18 ENCOUNTER — Encounter: Payer: Self-pay | Admitting: Cardiovascular Disease

## 2012-10-18 VITALS — BP 130/64 | HR 68 | Resp 18 | Ht 62.0 in | Wt 133.0 lb

## 2012-10-18 DIAGNOSIS — I4891 Unspecified atrial fibrillation: Secondary | ICD-10-CM

## 2012-10-18 DIAGNOSIS — I080 Rheumatic disorders of both mitral and aortic valves: Secondary | ICD-10-CM

## 2012-10-18 DIAGNOSIS — I10 Essential (primary) hypertension: Secondary | ICD-10-CM

## 2012-10-18 NOTE — Assessment & Plan Note (Signed)
Well controlled.  Continue current medications and low sodium Dash type diet.    

## 2012-10-18 NOTE — Patient Instructions (Addendum)
Your physician has recommended you make the following change in your medication: stop taking Pacerone  Your physician recommends that you schedule a follow-up appointment in: 3 months in [place] office

## 2012-10-18 NOTE — Assessment & Plan Note (Signed)
Mild by recent echo not clincially signficant

## 2012-10-18 NOTE — Progress Notes (Signed)
Patient ID: Norma Barajas, female   DOB: 01-18-1941, 71 y.o.   MRN: [id number] [name] is seen today ofr F/U of fatigue, MR, abnomrla ECG and CRF's of elevated lipids and HTN> See A/P for ECG issues with misplaced V1 lead at [facility] No SSCP or previously documented CAD. Previous echo 2006 with only mild MR. No CAD by cath in 2006. Occasional flip flops but no sustained palpitaitons. Comploant with BP and cholesterol meds. Not very active with fatigue and overweight.  Hospitalized at [facility] 11/21 with PAF  Seen by Dr Diona Browner.  Converted with cardizem.  Relative bradycardia and atenolol decreased.  Sent home on amiodarone.  F/U echo with normal EF and only mild MR  Amiodarone not tolerated well with abdominal pain and diarhea  ROS: Denies fever, malais, weight loss, blurry vision, decreased visual acuity, cough, sputum, SOB, hemoptysis, pleuritic pain, palpitaitons, heartburn, abdominal pain, melena, lower extremity edema, claudication, or rash.  All other systems reviewed and negative  General: Affect appropriate Chronically ill female HEENT: normal Neck supple with no adenopathy JVP normal no bruits no thyromegaly Lungs clear with no wheezing and good diaphragmatic motion Heart:  S1/S2 no murmur, no rub, gallop or click PMI normal Abdomen: benighn, BS positve, no tenderness, no AAA no bruit.  No HSM or HJR Distal pulses intact with no bruits No edema Neuro non-focal Skin warm and dry No muscular weakness   Current Outpatient Prescriptions  Medication Sig Dispense Refill  . ALPRAZolam (XANAX) 0.25 MG tablet Take 0.25 mg by mouth 2 (two) times daily as needed. For anxiety/nerves      . amiodarone (PACERONE) 200 MG tablet Take 200 mg by mouth daily.      Marland Kitchen amLODipine (NORVASC) 10 MG tablet Take 10 mg by mouth daily with lunch.       Marland Kitchen aspirin (ASPIRIN EC) 81 MG EC tablet Take 1 tablet (81 mg total) by mouth daily. Swallow whole.  30 tablet  12  . atenolol (TENORMIN) 50 MG tablet Take 0.5 tablets (25  mg total) by mouth 2 (two) times daily. The dose has been changed to half of a tablet twice daily.      . calcium citrate-vitamin D 200-200 MG-UNIT TABS Take 1 tablet by mouth every morning.       . fish oil-omega-3 fatty acids 1000 MG capsule Take 1 capsule by mouth daily.        . Multiple Vitamin (MULTIVITAMIN) capsule Take 1 capsule by mouth every morning.       Marland Kitchen omeprazole (PRILOSEC) 20 MG capsule Take 20 mg by mouth 2 (two) times daily.       . simvastatin (ZOCOR) 20 MG tablet Take 20 mg by mouth at bedtime.        . [DISCONTINUED] amiodarone (PACERONE) 200 MG tablet Take 1 tablet (200 mg total) by mouth 2 (two) times daily.  60 tablet  0    Allergies  Crestor; Iodine; Iohexol; Morphine; Risedronate sodium; and Triamterene-hctz  Electrocardiogram:  NSR rate 68 normal  Assessment and Plan

## 2012-10-18 NOTE — Assessment & Plan Note (Signed)
Maint NSR  D/C amiodarone She has no documented CAD and would consider flecainide for recurrence.  Ok to try to reschedule endo and esophageal stretching Continue lower dose atenolol

## 2012-10-26 ENCOUNTER — Other Ambulatory Visit: Payer: Self-pay | Admitting: Cardiology

## 2012-10-26 DIAGNOSIS — I4891 Unspecified atrial fibrillation: Secondary | ICD-10-CM

## 2012-11-03 ENCOUNTER — Emergency Department (HOSPITAL_COMMUNITY)
Admission: EM | Admit: 2012-11-03 | Discharge: 2012-11-03 | Disposition: A | Discharge status: Home / Self Care | Payer: Medicare PPO | Attending: Emergency Medicine | Admitting: Emergency Medicine

## 2012-11-03 ENCOUNTER — Encounter (HOSPITAL_COMMUNITY): Payer: Self-pay

## 2012-11-03 ENCOUNTER — Emergency Department (HOSPITAL_COMMUNITY): Payer: Medicare PPO

## 2012-11-03 DIAGNOSIS — Z8679 Personal history of other diseases of the circulatory system: Secondary | ICD-10-CM | POA: Insufficient documentation

## 2012-11-03 DIAGNOSIS — K209 Esophagitis, unspecified without bleeding: Secondary | ICD-10-CM | POA: Insufficient documentation

## 2012-11-03 DIAGNOSIS — Z79899 Other long term (current) drug therapy: Secondary | ICD-10-CM | POA: Insufficient documentation

## 2012-11-03 DIAGNOSIS — K219 Gastro-esophageal reflux disease without esophagitis: Secondary | ICD-10-CM | POA: Insufficient documentation

## 2012-11-03 DIAGNOSIS — Z9861 Coronary angioplasty status: Secondary | ICD-10-CM | POA: Insufficient documentation

## 2012-11-03 DIAGNOSIS — Z7982 Long term (current) use of aspirin: Secondary | ICD-10-CM | POA: Insufficient documentation

## 2012-11-03 DIAGNOSIS — I10 Essential (primary) hypertension: Secondary | ICD-10-CM | POA: Insufficient documentation

## 2012-11-03 DIAGNOSIS — E7800 Pure hypercholesterolemia, unspecified: Secondary | ICD-10-CM | POA: Insufficient documentation

## 2012-11-03 DIAGNOSIS — Z90710 Acquired absence of both cervix and uterus: Secondary | ICD-10-CM | POA: Insufficient documentation

## 2012-11-03 DIAGNOSIS — Z9849 Cataract extraction status, unspecified eye: Secondary | ICD-10-CM | POA: Insufficient documentation

## 2012-11-03 LAB — COMPREHENSIVE METABOLIC PANEL
ALT: 18 U/L (ref 0–35)
Albumin: 4 g/dL (ref 3.5–5.2)
Calcium: 9.1 mg/dL (ref 8.4–10.5)
GFR calc Af Amer: >90 mL/min (ref >90)
Glucose, Bld: 122 mg/dL — ABNORMAL HIGH (ref 70–99)
Potassium: 3.6 mEq/L (ref 3.5–5.1)
Sodium: 141 mEq/L (ref 135–145)
Total Protein: 7.6 g/dL (ref 6.0–8.3)

## 2012-11-03 LAB — CBC WITH DIFFERENTIAL/PLATELET
Basophils Absolute: 0 10*3/uL (ref 0.0–0.1)
Basophils Relative: 0 % (ref 0–1)
Eosinophils Absolute: 0.1 10*3/uL (ref 0.0–0.7)
Hemoglobin: 15.6 g/dL — ABNORMAL HIGH (ref 12.0–15.0)
MCH: 30.9 pg (ref 26.0–34.0)
MCHC: 34.9 g/dL (ref 30.0–36.0)
Monocytes Relative: 8 % (ref 3–12)
Neutro Abs: 5.5 10*3/uL (ref 1.7–7.7)
Neutrophils Relative %: 73 % (ref 43–77)
Platelets: 184 10*3/uL (ref 150–400)
RDW: 13.3 % (ref 11.5–15.5)

## 2012-11-03 MED ORDER — SUCRALFATE 1 G PO TABS: 1.0000 g | ORAL_TABLET | Freq: Four times a day (QID) | ORAL | Status: DC

## 2012-11-03 MED ORDER — GI COCKTAIL ~~LOC~~
30.0000 mL | Freq: Once | ORAL | Status: AC
  Administered 2012-11-03: 30 mL via ORAL
  Filled 2012-11-03: qty 30

## 2012-11-03 NOTE — ED Notes (Signed)
Pt via EMS from [facility] medicine. Pt has hx of h.pylori. Today has chest pain that is described as a burning.

## 2012-11-03 NOTE — ED Provider Notes (Signed)
History     CSN: [id number]  Arrival date & time 11/03/12  1751   First MD Initiated Contact with Patient 11/03/12 1801      Chief Complaint  Patient presents with  . Chest Pain    (Consider location/radiation/quality/duration/timing/severity/associated sxs/prior treatment) Patient is a 71 y.o. female presenting with chest pain. The history is provided by the patient (the pt complains of burning in her chest). No language interpreter was used.  Chest Pain The chest pain began yesterday. Chest pain occurs frequently. The chest pain is unchanged. Associated with: nothing. At its most intense, the pain is at 3/10. The pain is currently at 0/10. The severity of the pain is moderate. The quality of the pain is described as aching. The pain does not radiate. Pertinent negatives for primary symptoms include no fever, no fatigue, no cough and no abdominal pain.  Pertinent negatives for past medical history include no seizures.     Past Medical History  Diagnosis Date  . Hypertension   . Mitral regurgitation     Mild 2011 with moderate LAD  . Erosive esophagitis   . GERD (gastroesophageal reflux disease)   . Hypercholesterolemia   . History of cardiac catheterization December 2006    Nonobstructive CAD  . Atrial fibrillation with RVR 09/28/2012    Appears to be paroxysmal. Sinus brady at D/C.  Marland Kitchen Diastolic dysfunction 09/29/2012.    Grade 1. Ejection fraction 60-65%.    Past Surgical History  Procedure Date  . Abdominal hysterectomy   . Prolapsed uterine fibroid ligation   . Cataract extraction   . Steel plate in the leg     broke tibia and fibula after falling down stairs  . Esophagogastroduodenoscopy 2001    Dr. Arlyce Dice: erosive esophagitis, esophageal stricture, duodenitis, s/p Savary dilation  . Colonoscopy 2008    Dr. Darrick Penna: internal hemorrhoids     Family History  Problem Relation Age of Onset  . Colon cancer Mother     diagnosed at age 76    History  Substance  Use Topics  . Smoking status: Never Smoker   . Smokeless tobacco: Not on file  . Alcohol Use: No    OB History    Grav Para Term Preterm Abortions TAB SAB Ect Mult Living                  Review of Systems  Constitutional: Negative for fever and fatigue.  HENT: Negative for congestion, sinus pressure and ear discharge.   Eyes: Negative for discharge.  Respiratory: Negative for cough.   Cardiovascular: Positive for chest pain.  Gastrointestinal: Negative for abdominal pain and diarrhea.  Genitourinary: Negative for frequency and hematuria.  Musculoskeletal: Negative for back pain.  Skin: Negative for rash.  Neurological: Negative for seizures and headaches.  Hematological: Negative.   Psychiatric/Behavioral: Negative for hallucinations.    Allergies  Crestor; Iodine; Iohexol; Morphine; Risedronate sodium; and Triamterene-hctz  Home Medications   Current Outpatient Rx  Name  Route  Sig  Dispense  Refill  . ALPRAZOLAM 0.25 MG PO TABS   Oral   Take 0.25 mg by mouth 2 (two) times daily as needed. For anxiety/nerves         . AMLODIPINE BESYLATE 10 MG PO TABS   Oral   Take 10 mg by mouth daily with lunch.          . ASPIRIN 81 MG PO TBEC   Oral   Take 1 tablet (81 mg total) by mouth daily.  Swallow whole.   30 tablet   12   . ATENOLOL 50 MG PO TABS   Oral   Take 0.5 tablets (25 mg total) by mouth 2 (two) times daily. The dose has been changed to half of a tablet twice daily.         Marland Kitchen CALCIUM CITRATE-VITAMIN D 200-200 MG-UNIT PO TABS   Oral   Take 1 tablet by mouth every morning.          Marland Kitchen OMEGA-3 FATTY ACIDS 1000 MG PO CAPS   Oral   Take 1 capsule by mouth daily.           . MULTIVITAMINS PO CAPS   Oral   Take 1 capsule by mouth every morning.          Marland Kitchen OMEPRAZOLE 20 MG PO CPDR   Oral   Take 20 mg by mouth 2 (two) times daily.          Marland Kitchen SIMVASTATIN 20 MG PO TABS   Oral   Take 20 mg by mouth at bedtime.           . SUCRALFATE 1 G PO  TABS   Oral   Take 1 tablet (1 g total) by mouth 4 (four) times daily.   100 tablet   0     BP 171/87  Pulse 66  Temp 97.7 F (36.5 C) (Oral)  Resp 16  Ht 5\' 2"  (1.575 m)  Wt 128 lb (58.06 kg)  BMI 23.41 kg/m2  SpO2 97%  Physical Exam  Constitutional: She is oriented to person, place, and time. She appears well-developed.  HENT:  Head: Normocephalic and atraumatic.  Eyes: Conjunctivae normal and EOM are normal. No scleral icterus.  Neck: Neck supple. No thyromegaly present.  Cardiovascular: Normal rate and regular rhythm.  Exam reveals no gallop and no friction rub.   No murmur heard. Pulmonary/Chest: No stridor. She has no wheezes. She has no rales. She exhibits no tenderness.  Abdominal: She exhibits no distension. There is no tenderness. There is no rebound.  Musculoskeletal: Normal range of motion. She exhibits no edema.  Lymphadenopathy:    She has no cervical adenopathy.  Neurological: She is oriented to person, place, and time. Coordination normal.  Skin: No rash noted. No erythema.  Psychiatric: She has a normal mood and affect. Her behavior is normal.    ED Course  Procedures (including critical care time)  Labs Reviewed  CBC WITH DIFFERENTIAL - Abnormal; Notable for the following:    Hemoglobin 15.6 (*)     All other components within normal limits  COMPREHENSIVE METABOLIC PANEL - Abnormal; Notable for the following:    Glucose, Bld 122 (*)     GFR calc non Af Amer 81 (*)     All other components within normal limits  TROPONIN I   Dg Chest Port 1 View  11/03/2012  *RADIOLOGY REPORT*  Clinical Data: Chest pain and arm  PORTABLE CHEST - 1 VIEW  Comparison: Chest radiograph 09/28/2012  Findings: Normal cardiac silhouette.  There is left basilar atelectasis in a linear pattern which is similar to slightly increased compared to prior.  Upper lungs are clear.  No pneumothorax.  No pulmonary edema.  IMPRESSION: Stable to mildly increased left basilar atelectasis.    Original Report Authenticated By: Genevive Bi, M.D.      1. Esophagitis     Date: 11/03/2012  Rate:   Rhythm: normal sinus rhythm  QRS Axis: normal  Intervals: normal  ST/T Wave abnormalities: normal  Conduction Disutrbances:none  Narrative Interpretation:   Old EKG Reviewed: none available     MDM          Benny Lennert, MD 11/03/12 [phone]

## 2012-11-03 NOTE — Addendum Note (Signed)
Addended by: Burnett Kanaris A on: 11/03/2012 04:55 PM   Modules accepted: Orders

## 2012-11-06 ENCOUNTER — Encounter: Payer: Self-pay | Admitting: *Deleted

## 2012-11-09 ENCOUNTER — Ambulatory Visit (INDEPENDENT_AMBULATORY_CARE_PROVIDER_SITE_OTHER): Payer: Medicare PPO | Admitting: Gastroenterology

## 2012-11-09 ENCOUNTER — Other Ambulatory Visit: Payer: Self-pay | Admitting: Gastroenterology

## 2012-11-09 ENCOUNTER — Encounter (HOSPITAL_COMMUNITY): Payer: Self-pay | Admitting: Pharmacy Technician

## 2012-11-09 ENCOUNTER — Encounter: Payer: Self-pay | Admitting: Gastroenterology

## 2012-11-09 VITALS — BP 131/78 | HR 70 | Temp 97.3°F | Ht 60.0 in | Wt 128.8 lb

## 2012-11-09 DIAGNOSIS — K219 Gastro-esophageal reflux disease without esophagitis: Secondary | ICD-10-CM

## 2012-11-09 DIAGNOSIS — R1310 Dysphagia, unspecified: Secondary | ICD-10-CM

## 2012-11-09 DIAGNOSIS — K625 Hemorrhage of anus and rectum: Secondary | ICD-10-CM

## 2012-11-09 DIAGNOSIS — R197 Diarrhea, unspecified: Secondary | ICD-10-CM | POA: Insufficient documentation

## 2012-11-09 MED ORDER — LIDOCAINE VISCOUS 2 % MT SOLN: OROMUCOSAL | Status: DC

## 2012-11-09 MED ORDER — OMEPRAZOLE 20 MG PO CPDR: 20.0000 mg | DELAYED_RELEASE_CAPSULE | Freq: Two times a day (BID) | ORAL | Status: DC

## 2012-11-09 MED ORDER — HYDROCORTISONE ACETATE 25 MG RE SUPP: 25.0000 mg | Freq: Two times a day (BID) | RECTAL | Status: DC

## 2012-11-09 NOTE — Assessment & Plan Note (Signed)
Chronic reflux with sx not ideally controlled most likley due to dietary non-adherence: high fat foods. May be exacerbated by ASA use if Sx due to NSAID gastritis or H pylori gastritis. Pt does not use ETOH.  LOW FAT DIET EVALUATE NEW ONSET DYSPEPSIA WITH EGD. VISC LIDOCAINE/CARAFATE PRN. OPV IN  4 MOS

## 2012-11-09 NOTE — Progress Notes (Signed)
Subjective:    Patient ID: Norma Barajas, female    DOB: [birth year], 72 y.o.   MRN: [id number]  PCP: MOORE  HPI PAIN IN CHEST AND UPPER ABDOMEN. BURNING. PAIN CAN BE WORSE WITH FOODS: GREASY/SPICY. DIAGNOSED WITH GERD IN 2001. SX WORSE SINCE OCTOBER. LOST 5 LBS. RARE DIARRHEA. USES PEPTO SOMETIMES AND IT MAKES HER STOOLS BLACK. HAS BRBPR-YESTERDAY A COUPLE OF TIMES SINCE BEEN IN ASA ~1 MO. LAST TCS 2008: RECTAL BLEEDING-BLEEDING NOW JUST A LITTLE. THINKS IT'S DUE TO HER HEMORRHOIDS. LAST ENDO GOT ENOUGH MEDICINE. FEELS HEARTBURN/INIDIGESTION SEVERAL TIMES A DAY. GETS WORSE AT NIGHT. TAKING PRILOSEC 30 MINS PRIOR TO MEALS BID. BMs: 2X/DAY IF WATERY, SOMETIMES NL FORMED STOOL 1X/DAY. 1 STICK ICE CREAM: EVER DAY. MILK WITH CEREAL EVERY DAY.  PT DENIES FEVER, CHILLS, BRBPR, nausea, vomiting, melena, constipation, abd pain, problems swallowing, problems with sedation.   Past Medical History  Diagnosis Date  . Hypertension   . Mitral regurgitation     Mild 2011 with moderate LAD  . Erosive esophagitis   . GERD (gastroesophageal reflux disease)   . Hypercholesterolemia   . History of cardiac catheterization December 2006    Nonobstructive CAD  . Atrial fibrillation with RVR 09/28/2012    Appears to be paroxysmal. Sinus brady at D/C.  Marland Kitchen Diastolic dysfunction 09/29/2012.    Grade 1. Ejection fraction 60-65%.    Past Surgical History  Procedure Date  . Abdominal hysterectomy   . Prolapsed uterine fibroid ligation   . Cataract extraction   . Steel plate in the leg     broke tibia and fibula after falling down stairs  . Esophagogastroduodenoscopy 2001    Dr. Arlyce Dice: erosive esophagitis, esophageal stricture, duodenitis, s/p Savary dilation  . Colonoscopy 2008    Dr. Darrick Penna: internal hemorrhoids     Allergies  Allergen Reactions  . Crestor (Rosuvastatin Calcium) Other (See Comments)    weakness  . Iodine     ivp dye  . Iohexol   . Morphine   . Risedronate Sodium Other (See Comments)      ACTONEL REACTION: reflux  . Triamterene-Hctz Other (See Comments)    weakness    Current Outpatient Prescriptions  Medication Sig Dispense Refill  . ALPRAZolam (XANAX) 0.25 MG tablet Take 0.25 mg by mouth 2 (two) times daily as needed. For anxiety/nerves      . amLODipine (NORVASC) 10 MG tablet Take 10 mg by mouth daily with lunch.       Marland Kitchen aspirin (ASPIRIN EC) 81 MG EC tablet Take 1 tablet (81 mg total) by mouth daily. Swallow whole.    Marland Kitchen atenolol (TENORMIN) 50 MG tablet Take 0.5 tablets (25 mg total) by mouth 2 (two) times daily. The dose has been changed to half of a tablet twice daily.    . calcium citrate-vitamin D 200-200 MG-UNIT TABS Take 1 tablet by mouth every morning.     . fish oil-omega-3 fatty acids 1000 MG capsule Take 1 capsule by mouth daily.      . Multiple Vitamin (MULTIVITAMIN) capsule Take 1 capsule by mouth every morning.     Marland Kitchen omeprazole (PRILOSEC) 20 MG capsule Take 20 mg by mouth 2 (two) times daily.     . simvastatin (ZOCOR) 20 MG tablet Take 20 mg by mouth at bedtime.      . sucralfate (CARAFATE) 1 G tablet Take 1 tablet (1 g total) by mouth 4 (four) times daily.        Review of Systems  Objective:   Physical Exam  Vitals reviewed. Constitutional: She is oriented to person, place, and time. She appears well-nourished. No distress.  HENT:  Head: Normocephalic and atraumatic.  Mouth/Throat: Oropharynx is clear and moist. No oropharyngeal exudate.       UPR/LWR PLATES  Eyes: Pupils are equal, round, and reactive to light. No scleral icterus.  Neck: Normal range of motion. Neck supple.  Cardiovascular: Normal rate, regular rhythm and normal heart sounds.   Pulmonary/Chest: Effort normal and breath sounds normal. No respiratory distress.  Abdominal: Soft. Bowel sounds are normal. She exhibits no distension. There is tenderness. There is no rebound and no guarding.       MILD TTP IN THE EPIGASTRIUM    Musculoskeletal: Normal range of motion. She  exhibits no edema.  Lymphadenopathy:    She has no cervical adenopathy.  Neurological: She is alert and oriented to person, place, and time.       NO FOCAL DEFICITS   Psychiatric:       FLAT AFFECT, NL MOOD           Assessment & Plan:

## 2012-11-09 NOTE — Assessment & Plan Note (Signed)
SOLID FOODS ONLY-IN THE SETTING OF UNCONTROLLED REFLUX. SX SINCE OCT 2013. ASSOCIATED WITH WEIGHT LOSS. MOST LIKLEY DUE TO ESOPHAGEAL STRICTURE AND LESS LIKELY ESO CA/GASTRIC CA. NO HX: TOBACCO/ETOH ABUSE.  EGD/DIL JAN 10 LOW FAT DIET OPV IN 4 MOS

## 2012-11-09 NOTE — Progress Notes (Signed)
Faxed to PCP

## 2012-11-09 NOTE — Patient Instructions (Signed)
FOLLOW A LOW FAT DIET. SEE INFO BELOW.  TAKE PRILOSEC 30 MINUTES PRIOR TO MEALS TWICE DAILY.  USE LIDOCAINE LIQUID 2 TSP EVERY 4 TO 6 HOURS AS NEEDED FOR CHEST PAIN/HEARTBURN/INDIGESTION.  USE CARAFATE AS NEEDED DURING THE DAY AND TAKE A DOSE AT BEDTIME.  UPPER ENDOSCOPY WITH DILATION NEXT FRI, JAN 10.  FOLLOW UP IN 4 MOS.  Low-Fat Diet BREADS, CEREALS, PASTA, RICE, DRIED PEAS, AND BEANS These products are high in carbohydrates and most are low in fat. Therefore, they can be increased in the diet as substitutes for fatty foods. They too, however, contain calories and should not be eaten in excess. Cereals can be eaten for snacks as well as for breakfast.  Include foods that contain fiber (fruits, vegetables, whole grains, and legumes). Research shows that fiber may lower blood cholesterol levels, especially the water-soluble fiber found in fruits, vegetables, oat products, and legumes. FRUITS AND VEGETABLES It is good to eat fruits and vegetables. Besides being sources of fiber, both are rich in vitamins and some minerals. They help you get the daily allowances of these nutrients. Fruits and vegetables can be used for snacks and desserts. MEATS Limit lean meat, chicken, [place], and fish to no more than 6 ounces per day. Beef, Pork, and Lamb Use lean cuts of beef, pork, and lamb. Lean cuts include:  Extra-lean ground beef.  Arm roast.  Sirloin tip.  Center-cut ham.  Round steak.  Loin chops.  Rump roast.  Tenderloin.  Trim all fat off the outside of meats before cooking. It is not necessary to severely decrease the intake of red meat, but lean choices should be made. Lean meat is rich in protein and contains a highly absorbable form of iron. Premenopausal women, in particular, should avoid reducing lean red meat because this could increase the risk for low red blood cells (iron-deficiency anemia).  Chicken and [place] These are good sources of protein. The fat of poultry can be reduced  by removing the skin and underlying fat layers before cooking. Chicken and [place] can be substituted for lean red meat in the diet. Poultry should not be fried or covered with high-fat sauces. Fish and Shellfish Fish is a good source of protein. Shellfish contain cholesterol, but they usually are low in saturated fatty acids. The preparation of fish is important. Like chicken and [place], they should not be fried or covered with high-fat sauces. EGGS Egg whites contain no fat or cholesterol. They can be eaten often. Try 1 to 2 egg whites instead of whole eggs in recipes or use egg substitutes that do not contain yolk.  MILK AND DAIRY PRODUCTS Use skim or 1% milk instead of 2% or whole milk. Decrease whole milk, natural, and processed cheeses. Use nonfat or low-fat (2%) cottage cheese or low-fat cheeses made from vegetable oils. Choose nonfat or low-fat (1 to 2%) yogurt. Experiment with evaporated skim milk in recipes that call for heavy cream. Substitute low-fat yogurt or low-fat cottage cheese for sour cream in dips and salad dressings. Have at least 2 servings of low-fat dairy products, such as 2 glasses of skim (or 1%) milk each day to help get your daily calcium intake.  FATS AND OILS Butterfat, lard, and beef fats are high in saturated fat and cholesterol. These should be avoided.Vegetable fats do not contain cholesterol. AVOID coconut oil, palm oil, and palm kernel oil, WHICH are very high in saturated fats. These should be limited. These fats are often used in [employer], processed  foods, popcorn, oils, and nondairy creamers. Vegetable shortenings and some peanut butters contain hydrogenated oils, which are also saturated fats. Read the labels on these foods and check for saturated vegetable oils.  Desirable liquid vegetable oils are corn oil, cottonseed oil, olive oil, canola oil, safflower oil, soybean oil, and sunflower oil. Peanut oil is not as good, but small amounts are acceptable. Buy a  heart-healthy tub margarine that has no partially hydrogenated oils in the ingredients. AVOID Mayonnaise and salad dressings often are made from unsaturated fats.  OTHER EATING TIPS Snacks  Most sweets should be limited as snacks. They tend to be rich in calories and fats, and their caloric content outweighs their nutritional value. Some good choices in snacks are graham crackers, melba toast, soda crackers, bagels (no egg), English muffins, fruits, and vegetables. These snacks are preferable to snack crackers, [place] fries, and chips. Popcorn should be air-popped or cooked in small amounts of liquid vegetable oil.  Desserts Eat fruit, low-fat yogurt, and fruit ices instead of pastries, cake, and cookies. Sherbet, angel food cake, gelatin dessert, frozen low-fat yogurt, or other frozen products that do not contain saturated fat (pure fruit juice bars, frozen ice pops) are also acceptable.   COOKING METHODS Choose those methods that use little or no fat. They include: Poaching.  Braising.  Steaming.  Grilling.  Baking.  Stir-frying.  Broiling.  Microwaving.  Foods can be cooked in a nonstick pan without added fat, or use a nonfat cooking spray in regular cookware. Limit fried foods and avoid frying in saturated fat. Add moisture to lean meats by using water, broth, cooking wines, and other nonfat or low-fat sauces along with the cooking methods mentioned above. Soups and stews should be chilled after cooking. The fat that forms on top after a few hours in the refrigerator should be skimmed off. When preparing meals, avoid using excess salt. Salt can contribute to raising blood pressure in some people.  EATING AWAY FROM HOME Order entres, potatoes, and vegetables without sauces or butter. When meat exceeds the size of a deck of cards (3 to 4 ounces), the rest can be taken home for another meal. Choose vegetable or fruit salads and ask for low-calorie salad dressings to be served on the side.  Use dressings sparingly. Limit high-fat toppings, such as bacon, crumbled eggs, cheese, sunflower seeds, and olives. Ask for heart-healthy tub margarine instead of butter.

## 2012-11-09 NOTE — Assessment & Plan Note (Signed)
CHRONIC INTERMITTENT MOST LIKELY DUE TO LARGE INTERNAL HEMORRHOIDS & ASA USE.  ANUSOL HC PR Q12 FOR 12 DAYS. OPV IN 4 MOS.  RE-ADDRESS NEED FOR REPEAT TCS. OPV IN 4 MOS.

## 2012-11-09 NOTE — Assessment & Plan Note (Signed)
CHRONIC INTERMITTENT SX MOST LIKELY DUE TO LACTOSE INTOLERANCE.

## 2012-11-10 ENCOUNTER — Encounter: Payer: Self-pay | Admitting: Gastroenterology

## 2012-11-10 NOTE — Progress Notes (Signed)
Pt is aware of OV on 4/3 at 0930 with SF and appt card was mailed

## 2012-11-15 ENCOUNTER — Encounter (HOSPITAL_COMMUNITY): Payer: Self-pay | Admitting: *Deleted

## 2012-11-15 ENCOUNTER — Ambulatory Visit (HOSPITAL_COMMUNITY)
Admission: RE | Admit: 2012-11-15 | Discharge: 2012-11-15 | Disposition: A | Discharge status: Home / Self Care | Payer: Medicare PPO | Source: Ambulatory Visit | Attending: Gastroenterology | Admitting: Gastroenterology

## 2012-11-15 ENCOUNTER — Telehealth: Payer: Self-pay | Admitting: Gastroenterology

## 2012-11-15 ENCOUNTER — Encounter (HOSPITAL_COMMUNITY)
Admission: RE | Disposition: A | Discharge status: Home / Self Care | Payer: Self-pay | Source: Ambulatory Visit | Attending: Gastroenterology

## 2012-11-15 DIAGNOSIS — K2970 Gastritis, unspecified, without bleeding: Secondary | ICD-10-CM

## 2012-11-15 DIAGNOSIS — D131 Benign neoplasm of stomach: Secondary | ICD-10-CM

## 2012-11-15 DIAGNOSIS — R1013 Epigastric pain: Secondary | ICD-10-CM | POA: Insufficient documentation

## 2012-11-15 DIAGNOSIS — K2990 Gastroduodenitis, unspecified, without bleeding: Secondary | ICD-10-CM

## 2012-11-15 DIAGNOSIS — K3189 Other diseases of stomach and duodenum: Secondary | ICD-10-CM

## 2012-11-15 DIAGNOSIS — K2940 Chronic atrophic gastritis without bleeding: Secondary | ICD-10-CM | POA: Insufficient documentation

## 2012-11-15 DIAGNOSIS — K219 Gastro-esophageal reflux disease without esophagitis: Secondary | ICD-10-CM

## 2012-11-15 DIAGNOSIS — I10 Essential (primary) hypertension: Secondary | ICD-10-CM | POA: Insufficient documentation

## 2012-11-15 DIAGNOSIS — R1310 Dysphagia, unspecified: Secondary | ICD-10-CM | POA: Insufficient documentation

## 2012-11-15 HISTORY — PX: BRAVO PH STUDY: SHX5421

## 2012-11-15 HISTORY — PX: ESOPHAGOGASTRODUODENOSCOPY (EGD) WITH ESOPHAGEAL DILATION: SHX5812

## 2012-11-15 SURGERY — ESOPHAGOGASTRODUODENOSCOPY (EGD) WITH ESOPHAGEAL DILATION
Anesthesia: Moderate Sedation | Site: Esophagus

## 2012-11-15 MED ORDER — MEPERIDINE HCL 100 MG/ML IJ SOLN
INTRAMUSCULAR | Status: AC
  Filled 2012-11-15: qty 1

## 2012-11-15 MED ORDER — MINERAL OIL PO OIL
TOPICAL_OIL | ORAL | Status: AC
  Filled 2012-11-15: qty 30

## 2012-11-15 MED ORDER — MIDAZOLAM HCL 5 MG/5ML IJ SOLN
INTRAMUSCULAR | Status: DC | PRN
  Administered 2012-11-15 (×2): 2 mg via INTRAVENOUS

## 2012-11-15 MED ORDER — MIDAZOLAM HCL 5 MG/5ML IJ SOLN
INTRAMUSCULAR | Status: AC
  Filled 2012-11-15: qty 10

## 2012-11-15 MED ORDER — STERILE WATER FOR IRRIGATION IR SOLN
Status: DC | PRN
  Administered 2012-11-15: 10:00:00

## 2012-11-15 MED ORDER — SODIUM CHLORIDE 0.45 % IV SOLN
INTRAVENOUS | Status: DC
  Administered 2012-11-15: 09:00:00 via INTRAVENOUS

## 2012-11-15 MED ORDER — BUTAMBEN-TETRACAINE-BENZOCAINE 2-2-14 % EX AERO
INHALATION_SPRAY | CUTANEOUS | Status: DC | PRN
  Administered 2012-11-15: 2 via TOPICAL

## 2012-11-15 MED ORDER — MEPERIDINE HCL 100 MG/ML IJ SOLN
INTRAMUSCULAR | Status: DC | PRN
  Administered 2012-11-15 (×2): 25 mg via INTRAVENOUS

## 2012-11-15 NOTE — Telephone Encounter (Signed)
Pt was seen in our office recently and has a 4 month follow up with [facility] for April 3rd. Tammy in short stay called today to make a post procedure follow up in May. Please advise if I need to change OV in April.

## 2012-11-15 NOTE — Interval H&P Note (Signed)
History and Physical Interval Note:  11/15/2012 9:34 AM  Norma Barajas  has presented today for surgery, with the diagnosis of Dysphagia and uncontrolled reflux  The various methods of treatment have been discussed with the patient and family. After consideration of risks, benefits and other options for treatment, the patient has consented to  Procedure(s) (LRB) with comments: ESOPHAGOGASTRODUODENOSCOPY (EGD) WITH ESOPHAGEAL DILATION (N/A) - 11:15 as a surgical intervention .  The patient's history has been reviewed, patient examined, no change in status, stable for surgery.  I have reviewed the patient's chart and labs.  Questions were answered to the patient's satisfaction.     [employer]

## 2012-11-15 NOTE — Telephone Encounter (Signed)
REVIEWED.  OPV APR 2014

## 2012-11-15 NOTE — Op Note (Addendum)
[facility] [address] [place], [address]   ENDOSCOPY PROCEDURE REPORT  PATIENT: Norma, Barajas  MR#: [id number] BIRTHDATE: 01/29/1941 , 71  yrs. old GENDER: Female  ENDOSCOPIST: Jonette Eva, MD REFFERED [id number] Christell Constant, M.D.  PROCEDURE DATE:  11/15/2012 PROCEDURE:   EGD with biopsy and EGD with dilatation over guidewire & BRAVO CAPSULE PLACEMENT  INDICATIONS:1.  DYSPHAGIA & PERSISTENT EPIGASTRIC/CHEST PAIN.  NO ODYNOPHAGIA.  USES ASA DAILY. MEDICATIONS: Demerol 50 mg IV and Versed 5 mg IV TOPICAL ANESTHETIC: Cetacaine Spray  DESCRIPTION OF PROCEDURE:   After the risks benefits and alternatives of the procedure were thoroughly explained, informed consent was obtained.  The [id number] ([id number])  endoscope was introduced through the mouth and advanced to the second portion of the duodenum. The instrument was slowly withdrawn as the mucosa was carefully examined.  Prior to withdrawal of the scope, the guidwire was placed.  The esophagus was dilated successfully.  The patient was recovered in endoscopy and discharged home in satisfactory condition.   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   OTHERWISE NORMAL ESOPHAGUS. GE JUNCTION 36 CM FROM THE TEETH. BRAVO CAPSULE PLACED 30 CM FROM THE TEETH.  STOMACH: Small polypS WERE found in the gastric body and gastric fundus.  Multiple biopsies were performed using cold forceps. Mild non-erosive gastritis (inflammation) was found in the gastric antrum and gastric body.  Multiple biopsies were performed using cold forceps.   DUODENUM: The duodenal mucosa showed no abnormalities in the bulb and second portion of the duodenum.  Dilation was then performed at the proximal esophagus Dilator: Savary over guidewire Size(s): 12.8-16 MM Resistance: minimal Heme: yes Appearance: tear visualized  COMPLICATIONS: There were no complications.  ENDOSCOPIC IMPRESSION: 1.   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   Polyp was found in the gastric body and gastric fundus 3.   Non-erosive gastritis (inflammation) 4.   NO OBVIOUS SOURCE FOR CHEST AND EPIGASTRIC PAIN IDENTFIED. BRAVO CAPSULE PLACED TO EVALUATE FOR TX FAILURE FOR GERD, NON-ACID REFLUX, OR NON-ULCER DYSPEPSIA   RECOMMENDATIONS: AWAIT BIOPSY RETURN BRAVO IN 48 HRS LOW FAT DIET CONTINUE OMPERAZOLE HOLD ASA.  RESTART JAN 12. OPV MAY 2014      _______________________________ Rosalie DoctorJonette Eva, MD 11/15/2012 12:17 PM Revised: 11/15/2012 12:17 PM     PATIENT NAME:  Norma, Barajas MR#: [id number]

## 2012-11-15 NOTE — H&P (View-Only) (Signed)
Subjective:    Patient ID: Norma Barajas, female    DOB: 07/29/1941, 72 y.o.   MRN: [id number]  PCP: MOORE  HPI PAIN IN CHEST AND UPPER ABDOMEN. BURNING. PAIN CAN BE WORSE WITH FOODS: GREASY/SPICY. DIAGNOSED WITH GERD IN 2001. SX WORSE SINCE OCTOBER. LOST 5 LBS. RARE DIARRHEA. USES PEPTO SOMETIMES AND IT MAKES HER STOOLS BLACK. HAS BRBPR-YESTERDAY A COUPLE OF TIMES SINCE BEEN IN ASA ~1 MO. LAST TCS 2008: RECTAL BLEEDING-BLEEDING NOW JUST A LITTLE. THINKS IT'S DUE TO HER HEMORRHOIDS. LAST ENDO GOT ENOUGH MEDICINE. FEELS HEARTBURN/INIDIGESTION SEVERAL TIMES A DAY. GETS WORSE AT NIGHT. TAKING PRILOSEC 30 MINS PRIOR TO MEALS BID. BMs: 2X/DAY IF WATERY, SOMETIMES NL FORMED STOOL 1X/DAY. 1 STICK ICE CREAM: EVER DAY. MILK WITH CEREAL EVERY DAY.  PT DENIES FEVER, CHILLS, BRBPR, nausea, vomiting, melena, constipation, abd pain, problems swallowing, problems with sedation.   Past Medical History  Diagnosis Date  . Hypertension   . Mitral regurgitation     Mild 2011 with moderate LAD  . Erosive esophagitis   . GERD (gastroesophageal reflux disease)   . Hypercholesterolemia   . History of cardiac catheterization December 2006    Nonobstructive CAD  . Atrial fibrillation with RVR 09/28/2012    Appears to be paroxysmal. Sinus brady at D/C.  . Diastolic dysfunction 09/29/2012.    Grade 1. Ejection fraction 60-65%.    Past Surgical History  Procedure Date  . Abdominal hysterectomy   . Prolapsed uterine fibroid ligation   . Cataract extraction   . Steel plate in the leg     broke tibia and fibula after falling down stairs  . Esophagogastroduodenoscopy 2001    Dr. Kaplan: erosive esophagitis, esophageal stricture, duodenitis, s/p Savary dilation  . Colonoscopy 2008    Dr. Ronit Cranfield: internal hemorrhoids     Allergies  Allergen Reactions  . Crestor (Rosuvastatin Calcium) Other (See Comments)    weakness  . Iodine     ivp dye  . Iohexol   . Morphine   . Risedronate Sodium Other (See Comments)      ACTONEL REACTION: reflux  . Triamterene-Hctz Other (See Comments)    weakness    Current Outpatient Prescriptions  Medication Sig Dispense Refill  . ALPRAZolam (XANAX) 0.25 MG tablet Take 0.25 mg by mouth 2 (two) times daily as needed. For anxiety/nerves      . amLODipine (NORVASC) 10 MG tablet Take 10 mg by mouth daily with lunch.       . aspirin (ASPIRIN EC) 81 MG EC tablet Take 1 tablet (81 mg total) by mouth daily. Swallow whole.    . atenolol (TENORMIN) 50 MG tablet Take 0.5 tablets (25 mg total) by mouth 2 (two) times daily. The dose has been changed to half of a tablet twice daily.    . calcium citrate-vitamin D 200-200 MG-UNIT TABS Take 1 tablet by mouth every morning.     . fish oil-omega-3 fatty acids 1000 MG capsule Take 1 capsule by mouth daily.      . Multiple Vitamin (MULTIVITAMIN) capsule Take 1 capsule by mouth every morning.     . omeprazole (PRILOSEC) 20 MG capsule Take 20 mg by mouth 2 (two) times daily.     . simvastatin (ZOCOR) 20 MG tablet Take 20 mg by mouth at bedtime.      . sucralfate (CARAFATE) 1 G tablet Take 1 tablet (1 g total) by mouth 4 (four) times daily.        Review of Systems       Objective:   Physical Exam  Vitals reviewed. Constitutional: She is oriented to person, place, and time. She appears well-nourished. No distress.  HENT:  Head: Normocephalic and atraumatic.  Mouth/Throat: Oropharynx is clear and moist. No oropharyngeal exudate.       UPR/LWR PLATES  Eyes: Pupils are equal, round, and reactive to light. No scleral icterus.  Neck: Normal range of motion. Neck supple.  Cardiovascular: Normal rate, regular rhythm and normal heart sounds.   Pulmonary/Chest: Effort normal and breath sounds normal. No respiratory distress.  Abdominal: Soft. Bowel sounds are normal. She exhibits no distension. There is tenderness. There is no rebound and no guarding.       MILD TTP IN THE EPIGASTRIUM    Musculoskeletal: Normal range of motion. She  exhibits no edema.  Lymphadenopathy:    She has no cervical adenopathy.  Neurological: She is alert and oriented to person, place, and time.       NO FOCAL DEFICITS   Psychiatric:       FLAT AFFECT, NL MOOD           Assessment & Plan:   

## 2012-11-15 NOTE — Progress Notes (Signed)
1018 1 versed total of 5mg  given per Dr. Darrick Penna order

## 2012-11-20 ENCOUNTER — Encounter (HOSPITAL_COMMUNITY): Payer: Self-pay | Admitting: Gastroenterology

## 2012-11-21 ENCOUNTER — Telehealth: Payer: Self-pay

## 2012-11-21 NOTE — Telephone Encounter (Signed)
Pt is requesting results from the [facility]. Said she turned it in last Fri. She is still having chest burning/pain  And abdominal pain and discomfort. She said the liquid medicine that Dr. Darrick Penna gave her helped some, but she is still having problems. She just wants to know what is going on with her. Please advise!

## 2012-11-22 NOTE — Brief Op Note (Addendum)
11/15/2012  7:37 PM  PATIENT:  Norma Barajas  72 y.o. female  PRE-OPERATIVE DIAGNOSIS:  Dysphagia and uncontrolled reflux   POST-OPERATIVE DIAGNOSIS:  NON-ULCER DYSPEPSIA  PROCEDURE:  Procedure(s): BRAVO PH STUDY ON OMEPRAZOLE BID  SURGEON:  Surgeon(s): Arlyce Harman, MD  FINDINGS:  PT HAD RECORDER FOR 1 DAY AND 21 HOURS 7 MINS   FRACTION Ph TOTAL:  DAY 1 2.4  DAY 2 0.1   # OF REFLUXES:    DAY 1 29  DAY 2 1   LONG REFLUX > 5:   DAY 1 2 DAY 2 0    LONGEST REFLUX:   DAY 1 11 DAY 2 1   REFLUX TABLE: UPRIGHT 11 EPISODES, SUPINE 1  DEMEESTER SCORE DAY 1: 9.3  (NL < 14.72)  DEMEESTER SCORE DAY 2:  0.7 SAP TABLE: ACID REFLUX ANALYSIS: 99.9 % SUPINE DRINK 100%  DIAGNOSIS: NON-ULCER DYSPEPSIA  PLAN: 1. NCREASE TO 30 MG QHS. 2. CONTINUE OMEPRAZOLE BID. 3. OPV IN APR OR MAY 2014 E30 VISIT.

## 2012-11-23 ENCOUNTER — Other Ambulatory Visit: Payer: Self-pay | Admitting: Gastroenterology

## 2012-11-23 DIAGNOSIS — R1010 Upper abdominal pain, unspecified: Secondary | ICD-10-CM

## 2012-11-23 MED ORDER — DIPHENHYDRAMINE HCL 25 MG PO CAPS: ORAL_CAPSULE | ORAL | Status: DC

## 2012-11-23 MED ORDER — PREDNISONE 10 MG PO TABS: ORAL_TABLET | ORAL | Status: DC

## 2012-11-23 MED ORDER — AMITRIPTYLINE HCL 10 MG PO TABS: ORAL_TABLET | ORAL | Status: DC

## 2012-11-23 NOTE — Telephone Encounter (Signed)
Forwarding to Dr.Fields.  

## 2012-11-23 NOTE — Telephone Encounter (Signed)
PT HAS IV DYE ALLERGY-NEEDS PREDNISONE AND BENADRYL PREMED. RX SENT.  PREDNISONE 50 MG 13 HRS, 7 HRS, AND 1 HR PRIOR TO SCAN BENADRYL 50 MG PO 1 HOUR PRIOR TO SCAN.

## 2012-11-23 NOTE — Telephone Encounter (Signed)
REVIEWED. AGREE. 

## 2012-11-23 NOTE — Telephone Encounter (Signed)
Patient is scheduled for CT scan on Tues Jan 21 st at 10:15 and she is aware of her instructions I spoke to her and her husband both were understanding

## 2012-11-23 NOTE — Addendum Note (Signed)
Addended by: West Bali on: 11/23/2012 02:19 PM   Modules accepted: Orders

## 2012-11-23 NOTE — Telephone Encounter (Signed)
Called patient TO DISCUSS RESULTS. BRAVO STUDY SHOWS NON-ULCER DYSPEPSIA. TAKING PRILOSEC BID. PT NEEDS ELAVIL 10 MG QHS FOR 5 DAYS THEN 20 MS QHS FOR 5 DAYS THEN 3 PO QHS FOR 5 DAYS. CT ABD/PELVIS W/ IVC TO COMPLETE THE W/O FOR UPPER ABD PAIN. MED SIDE EFFECTS DISCUSSED.

## 2012-11-24 ENCOUNTER — Other Ambulatory Visit: Payer: Self-pay

## 2012-11-24 DIAGNOSIS — Z139 Encounter for screening, unspecified: Secondary | ICD-10-CM

## 2012-11-24 LAB — CREATININE, SERUM: Creat: 0.75 mg/dL (ref 0.50–1.10)

## 2012-11-28 ENCOUNTER — Other Ambulatory Visit: Payer: Self-pay | Admitting: Gastroenterology

## 2012-11-28 ENCOUNTER — Ambulatory Visit (HOSPITAL_COMMUNITY)
Admission: RE | Admit: 2012-11-28 | Discharge: 2012-11-28 | Disposition: A | Discharge status: Home / Self Care | Payer: Medicare PPO | Source: Ambulatory Visit | Attending: Gastroenterology | Admitting: Gastroenterology

## 2012-11-28 DIAGNOSIS — R1010 Upper abdominal pain, unspecified: Secondary | ICD-10-CM

## 2012-11-28 DIAGNOSIS — E119 Type 2 diabetes mellitus without complications: Secondary | ICD-10-CM | POA: Insufficient documentation

## 2012-11-28 DIAGNOSIS — K7689 Other specified diseases of liver: Secondary | ICD-10-CM | POA: Insufficient documentation

## 2012-11-28 DIAGNOSIS — D3500 Benign neoplasm of unspecified adrenal gland: Secondary | ICD-10-CM | POA: Insufficient documentation

## 2012-11-28 DIAGNOSIS — R1013 Epigastric pain: Secondary | ICD-10-CM | POA: Insufficient documentation

## 2012-11-28 DIAGNOSIS — K921 Melena: Secondary | ICD-10-CM | POA: Insufficient documentation

## 2012-11-28 DIAGNOSIS — R9389 Abnormal findings on diagnostic imaging of other specified body structures: Secondary | ICD-10-CM | POA: Insufficient documentation

## 2012-11-28 NOTE — Progress Notes (Signed)
Patient was given pre meds for IV contrast allergy, patient states that she is not allergic to IV contrast that is documented in EPIC. Patient refused pre meds and refused IV contrast. I did speak to Dr Tyron Russell before proceeding with CT w/o contrast. He ok'd [place] to go ahead. I did try to contact Dr. Darrick Penna at the office, but she was in an OR procedure.

## 2012-11-30 NOTE — Telephone Encounter (Signed)
Routing to Julie.  

## 2012-11-30 NOTE — Telephone Encounter (Addendum)
[employer] DENIED ELAVIL. CALLED [phone]. NEED TO FAX INFO TO APPEAL TO [phone], REF 3: [id number]. FAX ALL OFC VISITS, PROCEDURES INCLUDING BRAVO RESULTS TO [employer].      PLEASE CALL PT. I AM APPEALING THE [employer]'S REFUSING TO PAY FOR HER ELAVIL. 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 I WILL CALL HER WHEN I HEAR FROM [employer].

## 2012-11-30 NOTE — Telephone Encounter (Signed)
All notes have been faxed to [facility]

## 2012-12-05 ENCOUNTER — Telehealth: Payer: Self-pay

## 2012-12-05 NOTE — Telephone Encounter (Signed)
Called and informed pt.  

## 2012-12-05 NOTE — Telephone Encounter (Signed)
Pt called and requested results from her CT that was done on 11/28/2012.

## 2012-12-05 NOTE — Telephone Encounter (Signed)
See note from jan 23

## 2012-12-06 ENCOUNTER — Telehealth: Payer: Self-pay

## 2012-12-06 NOTE — Telephone Encounter (Signed)
  REVIEWED.  SEE NOTE JAN 14

## 2012-12-06 NOTE — Telephone Encounter (Signed)
Pt called and would like to know what she is supposed to do about the cyst on her liver and her bladder being dropped. Please advise! She will be home for about an hour.

## 2012-12-06 NOTE — Telephone Encounter (Addendum)
Cloria Spring, LPN, LPN 1/61/0960 4:54 PM Signed  Pt called and would like to know what she is supposed to do about the cyst on her liver and her bladder being dropped. Please advise! She will be home for about an hour.   Called patient TO DISCUSS RESULTS. DISCUSSED BLADDER FINDINGS. REPORTS HAVING HER BLADDER IN A SLING. [facility]DR. REVARD [phone]. PT WOULD LIKE REFERRAL TO [facility]. TOLD PT WE WOULD SEND THE REFERRAL.  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 ANSWERED QUESTION REGARDING NEED FOR ELAVIL HUSBAND STATES [employer] CALLED THEM TO TELL THE A RX WAS READY. REVIEWED SEDATING SIDE EFFECTS OF MEDICATION. PT WILL CALL WITH QUESTIONS OR CONCERNS.

## 2012-12-07 NOTE — Telephone Encounter (Signed)
REVIEWED.  

## 2012-12-07 NOTE — Telephone Encounter (Signed)
Dr. Antonietta Breach office is not taking any new Medicare Gyn patients please advise what to do?

## 2012-12-07 NOTE — Telephone Encounter (Signed)
PLEASE CALL PT. Explain she cannot have an appt with them . Aks if she wants to see [facility]. Otherwise, she will need to call medicare and ask them for a GYN provider.

## 2012-12-07 NOTE — Telephone Encounter (Signed)
I spoke to Mrs. Cullins and she okayed me to fax Referral to Dr. Emelda Fear and Referral has been faxed

## 2012-12-07 NOTE — Telephone Encounter (Signed)
I LMOM for her to call me

## 2013-01-02 NOTE — Progress Notes (Signed)
EGD/DIL JAN 2014 BRAVO STUDY NON-ULCER DYSPEPSIA  BID PPI/TCA QHS-OPV APR OR MAY 2014  REVIEWED.

## 2013-01-02 NOTE — Progress Notes (Signed)
REVIEWED.  

## 2013-01-15 ENCOUNTER — Other Ambulatory Visit: Payer: Self-pay | Admitting: Family Medicine

## 2013-01-15 DIAGNOSIS — R51 Headache: Secondary | ICD-10-CM

## 2013-01-17 ENCOUNTER — Encounter: Payer: Self-pay | Admitting: Cardiovascular Disease

## 2013-01-17 ENCOUNTER — Ambulatory Visit (INDEPENDENT_AMBULATORY_CARE_PROVIDER_SITE_OTHER): Payer: Medicare PPO | Admitting: Cardiovascular Disease

## 2013-01-17 VITALS — BP 142/78 | HR 73 | Ht 62.0 in | Wt 126.1 lb

## 2013-01-17 DIAGNOSIS — E785 Hyperlipidemia, unspecified: Secondary | ICD-10-CM

## 2013-01-17 DIAGNOSIS — K219 Gastro-esophageal reflux disease without esophagitis: Secondary | ICD-10-CM

## 2013-01-17 DIAGNOSIS — I4891 Unspecified atrial fibrillation: Secondary | ICD-10-CM

## 2013-01-17 DIAGNOSIS — I080 Rheumatic disorders of both mitral and aortic valves: Secondary | ICD-10-CM

## 2013-01-17 DIAGNOSIS — I10 Essential (primary) hypertension: Secondary | ICD-10-CM

## 2013-01-17 NOTE — Assessment & Plan Note (Signed)
GERD/Chest pain not clear if GI or not PRN nitro and if helps may need myovue No history of CAD

## 2013-01-17 NOTE — Progress Notes (Signed)
Patient ID: Norma Barajas, female   DOB: February 17, 1941, 72 y.o.   MRN: [id number] Norma Barajas is seen today ofr F/U of fatigue, MR, abnomrla ECG and CRF's of elevated lipids and HTN> See A/P for ECG issues with misplaced V1 lead at [facility] No SSCP or previously documented CAD. Previous echo 2006 with only mild MR. No CAD by cath in 2006. Occasional flip flops but no sustained palpitaitons. Comploant with BP and cholesterol meds. Not very active with fatigue and overweight.  Hospitalized at [facility] 11/21 with PAF Seen by Dr Diona Browner. Converted with cardizem. Relative bradycardia and atenolol decreased. Sent home on amiodarone. F/U echo with normal EF and only mild MR  Amiodarone not tolerated well with abdominal pain and diarhea  Stopped 12/13  Lots of issues with dizzyness and inner ear.  Getting MRI in am  Also some GI issues Last 2 months some chest pain.  GI overtones with burnign and belching.  Does not want myovue Discussed using husbands nitro and see if it helps.  "stress" causes pain as well  ROS: Denies fever, malais, weight loss, blurry vision, decreased visual acuity, cough, sputum, SOB, hemoptysis, pleuritic pain, palpitaitons, heartburn, abdominal pain, melena, lower extremity edema, claudication, or rash.  All other systems reviewed and negative  General: Affect appropriate Healthy:  appears stated age HEENT: normal Neck supple with no adenopathy JVP normal no bruits no thyromegaly Lungs clear with no wheezing and good diaphragmatic motion Heart:  S1/S2 no murmur, no rub, gallop or click PMI normal Abdomen: benighn, BS positve, no tenderness, no AAA no bruit.  No HSM or HJR Distal pulses intact with no bruits No edema Neuro non-focal Skin warm and dry No muscular weakness   Current Outpatient Prescriptions  Medication Sig Dispense Refill  . ALPRAZolam (XANAX) 0.25 MG tablet Take 0.25 mg by mouth 2 (two) times daily as needed. For anxiety/nerves      . atenolol (TENORMIN) 50 MG tablet Take 0.5  tablets (25 mg total) by mouth 2 (two) times daily. The dose has been changed to half of a tablet twice daily.      . calcium citrate-vitamin D 200-200 MG-UNIT TABS Take 1 tablet by mouth every morning.       . diphenhydrAMINE (BENADRYL) 25 mg capsule 2 PO 1 HOUR PRIOR TO YOUR SCAN  2 capsule  0  . fish oil-omega-3 fatty acids 1000 MG capsule Take 1 capsule by mouth daily.        Marland Kitchen lidocaine (XYLOCAINE) 2 % solution 2 TSP  PO Q4-6H PRN FOR CHEST PAIN  200 mL  1  . Multiple Vitamin (MULTIVITAMIN) capsule Take 1 capsule by mouth every morning.       Marland Kitchen omeprazole (PRILOSEC) 20 MG capsule Take 1 capsule (20 mg total) by mouth 2 (two) times daily.  62 capsule  11  . predniSONE (DELTASONE) 10 MG tablet 5 PO 13 HRS PRIOR TO YOUR SCAN, 7 HOURS PRIOR TO YOUR SCAN AND 1 HOUR PRIOR TO YOUR SCAN  15 tablet  0  . simvastatin (ZOCOR) 20 MG tablet Take 20 mg by mouth at bedtime.        . sucralfate (CARAFATE) 1 G tablet Take 1 tablet (1 g total) by mouth 4 (four) times daily.  100 tablet  0   No current facility-administered medications for this visit.    Allergies  Crestor; Iodine; Iohexol; Morphine; Risedronate sodium; and Triamterene-hctz  Electrocardiogram:  11/06/12  SR rate 68 normal except isolated PVC  Echo:  09/29/12  EF 60-65% mild MR  Assessment and Plan

## 2013-01-17 NOTE — Assessment & Plan Note (Signed)
Maint NSR off amiodarone Event monitor 11/13 with no afib  ASA

## 2013-01-17 NOTE — Assessment & Plan Note (Signed)
No murmur on exam No need for f/u echo at this time

## 2013-01-17 NOTE — Patient Instructions (Addendum)
Your physician recommends that you schedule a follow-up appointment in: 3 months with Dr. Eden Emms  Continue taking your current medications as prescribed

## 2013-01-17 NOTE — Assessment & Plan Note (Signed)
Well controlled.  Continue current medications and low sodium Dash type diet.    

## 2013-01-17 NOTE — Assessment & Plan Note (Signed)
Cholesterol is at goal.  Continue current dose of statin and diet Rx.  No myalgias or side effects.  F/U  LFT's in 6 months. Lab Results  Component Value Date   LDLCALC 72 09/28/2012

## 2013-01-18 ENCOUNTER — Ambulatory Visit (HOSPITAL_COMMUNITY)
Admission: RE | Admit: 2013-01-18 | Discharge: 2013-01-18 | Disposition: A | Discharge status: Home / Self Care | Payer: Medicare PPO | Source: Ambulatory Visit | Attending: Family Medicine | Admitting: Family Medicine

## 2013-01-18 DIAGNOSIS — R42 Dizziness and giddiness: Secondary | ICD-10-CM | POA: Insufficient documentation

## 2013-01-18 DIAGNOSIS — R51 Headache: Secondary | ICD-10-CM | POA: Insufficient documentation

## 2013-02-06 ENCOUNTER — Encounter: Payer: Self-pay | Admitting: Gastroenterology

## 2013-02-08 ENCOUNTER — Encounter: Payer: Self-pay | Admitting: Gastroenterology

## 2013-02-08 ENCOUNTER — Ambulatory Visit (INDEPENDENT_AMBULATORY_CARE_PROVIDER_SITE_OTHER): Payer: Medicare PPO | Admitting: Gastroenterology

## 2013-02-08 VITALS — BP 146/77 | HR 65 | Temp 97.9°F | Ht 62.0 in | Wt 127.4 lb

## 2013-02-08 DIAGNOSIS — R1310 Dysphagia, unspecified: Secondary | ICD-10-CM

## 2013-02-08 DIAGNOSIS — K625 Hemorrhage of anus and rectum: Secondary | ICD-10-CM

## 2013-02-08 NOTE — Assessment & Plan Note (Signed)
SX RESOLVED AFTER CONSTIPATION IMPROVED.  CONTINUE [employer] [facility] SUPP. CAN'T AFFORD ANUSOL HC. CALL INSURANCE CO FOR ALTERNATIVE ON HER PHARMACY FORMULARY. PT DECLINED RX FOR ALTERNATIVE AT THIS POINT. EXPLAINED WHY PT SHOULD HAVE A TCS. PT DECLINED AT THIS TIME. INTERESTED IN [facility] BANDING WHEN AVAILABLE. EXPLAINED PROCEDURE/PROCESS TO PT. WILL CALL PT WHEN AVAILABLE. AVOID CONSTIPATION OPV IN 6 MOS

## 2013-02-08 NOTE — Progress Notes (Signed)
CC PCP 

## 2013-02-08 NOTE — Progress Notes (Addendum)
Subjective:    Patient ID: Norma Barajas, female    DOB: 05-Mar-1941, 72 y.o.   MRN: [id number]  PCP: WONG  HPI CONCERNED ABOUT HER HEMORRHOIDS. WAS STRAINING AND SAW BLOOD AFTER HARD TO MOVE BOWELS/HARD BMs. GOT UP AND SAW BLOOD IN THE STOOL. NOT POURING OUT. TUNED THE WATER RED. LAST TIME SAW BLOOD WAS 1 MO AGO. USING A [employer] SUPP AFTER BMs.  ISSUES WITH UPPER GI SX BETTER. JUST SPONTANEOUSLY RESOLVED.  SX RESOLVED AFTER INCREASING OMP BID. DIDN'T WANT TO TAKE ELAVIL; DUE TO POTENTIAL SIDE EFFECTS. PROBLEMS WITH SEDATION: "TOO MUCH"-COULDN'T HARDLY WALK AND HAD VOMITING. HAVING LOWER ABD PAIN BEFORE BMs THAT GETS BETTER AFTER BM/SUPP. Couldn't afford ANUSOL HC SUPP. Problems swallowing, WHEN SHE'S EATING CEREAL. NOT THAT BIG A PROBLEM. PT DENIES FEVER, CHILLS, nausea, vomiting, melena, diarrhea, heartburn or indigestion.  Past Medical History  Diagnosis Date  . Hypertension   . Mitral regurgitation     Mild 2011 with moderate LAD  . Erosive esophagitis   . GERD (gastroesophageal reflux disease)   . Hypercholesterolemia   . History of cardiac catheterization December 2006    Nonobstructive CAD  . Atrial fibrillation with RVR 09/28/2012    Appears to be paroxysmal. Sinus brady at D/C.  Marland Kitchen Diastolic dysfunction 09/29/2012.    Grade 1. Ejection fraction 60-65%.   Past Surgical History  Procedure Laterality Date  . Abdominal hysterectomy    . Prolapsed uterine fibroid ligation    . Cataract extraction    . Steel plate in the leg      broke tibia and fibula after falling down stairs  . Esophagogastroduodenoscopy  2001    Dr. Arlyce Dice: erosive esophagitis, esophageal stricture, duodenitis, s/p Savary dilation  . Colonoscopy  2008    Dr. Darrick Penna: internal hemorrhoids   . 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 [facility] ph study  11/15/2012   Procedure: BRAVO PH STUDY;  Surgeon: West Bali, MD;  Location: [facility];  Service: Endoscopy;;   Allergies  Allergen Reactions  . Crestor (Rosuvastatin Calcium) Other (See Comments)    weakness  . Iodine     ivp dye  . Iohexol   . Morphine   . Risedronate Sodium Other (See Comments)    ACTONEL REACTION: reflux  . Triamterene-Hctz Other (See Comments)    weakness   Current Outpatient Prescriptions  Medication Sig Dispense Refill  . ALPRAZolam (XANAX) 0.25 MG tablet Take 0.25 mg by mouth 2 (two) times daily as needed. For anxiety/nerves      . aspirin 81 MG tablet Take 81 mg by mouth daily.      Marland Kitchen atenolol (TENORMIN) 50 MG tablet Take 50 mg by mouth at bedtime. The dose has been changed to half of a tablet twice daily.      . calcium citrate-vitamin D 200-200 MG-UNIT TABS Take 1 tablet by mouth every morning.       . fish oil-omega-3 fatty acids 1000 MG capsule Take 1 capsule by mouth daily.        . Multiple Vitamin (MULTIVITAMIN) capsule Take 1 capsule by mouth every morning.       Marland Kitchen omeprazole (PRILOSEC) 20 MG capsule Take 1 capsule (20 mg total) by mouth 2 (two) times daily.    . simvastatin (ZOCOR) 20 MG tablet Take 20 mg by mouth at bedtime.  Review of Systems WRFP JAN 2014 HB 13.7 MCV 90 MAR 2014 HB 11.8 MCV 86.8    Objective:   Physical Exam  Vitals reviewed. Constitutional: She is oriented to person, place, and time. She appears well-nourished. No distress.  HENT:  Head: Normocephalic and atraumatic.  Mouth/Throat: Oropharynx is clear and moist. No oropharyngeal exudate.  Eyes: Pupils are equal, round, and reactive to light. No scleral icterus.  Neck: Normal range of motion. Neck supple.  Cardiovascular: Normal rate, regular rhythm and normal heart sounds.   Pulmonary/Chest: Effort normal and breath sounds normal. No respiratory distress.  Abdominal: Soft. Bowel sounds are normal. She exhibits no distension.  Musculoskeletal: She exhibits no  edema.  Lymphadenopathy:    She has no cervical adenopathy.  Neurological: She is alert and oriented to person, place, and time.  NO FOCAL DEFICITS   Psychiatric:  FLAT AFFECT, NL MOOD           Assessment & Plan:

## 2013-02-08 NOTE — Patient Instructions (Signed)
FOLLOW A HIGH FIBER DIET. AVOID ITEMS THAT CAUSE BLOATING AND GAS. SEE INFO BELOW.  DRINK WATER TO KEEP YOUR URINE LIGHT YELLOW.  I WILL CALL YOU WHEN THE HEMORRHOID BANDING IS AVAILABLE.  CALL ME IF YOU CHANGE YOUR MIND ABOUT THE SWALLOWING TEST OR COLONOSCOPY.  FOLLOW UP IN OCT 2014.  High-Fiber Diet A high-fiber diet changes your normal diet to include more whole grains, legumes, fruits, and vegetables. Changes in the diet involve replacing refined carbohydrates with unrefined foods. The calorie level of the diet is essentially unchanged. The Dietary Reference Intake (recommended amount) for adult males is 38 grams per day. For adult females, it is 25 grams per day. Pregnant and lactating women should consume 28 grams of fiber per day. Fiber is the intact part of a plant that is not broken down during digestion. Functional fiber is fiber that has been isolated from the plant to provide a beneficial effect in the body. PURPOSE  Increase stool bulk.   Ease and regulate bowel movements.   Lower cholesterol.  INDICATIONS THAT YOU NEED MORE FIBER  Constipation and hemorrhoids.   Uncomplicated diverticulosis (intestine condition) and irritable bowel syndrome.   Weight management.   As a protective measure against hardening of the arteries (atherosclerosis), diabetes, and cancer.   DO NOT USE WITH:  Acute diverticulitis (intestine infection).   Partial small bowel obstructions.   Complicated diverticular disease involving bleeding, rupture (perforation), or abscess (boil, furuncle).   Presence of autonomic neuropathy (nerve damage) or gastroparesis (stomach cannot empty itself).    GUIDELINES FOR INCREASING FIBER IN THE DIET  Start adding fiber to the diet slowly. A gradual increase of about 5 more grams (2 slices of whole-wheat bread, 2 servings of most fruits or vegetables, or 1 bowl of high-fiber cereal) per day is best. Too rapid an increase in fiber may result in  constipation, flatulence, and bloating.   Drink enough water and fluids to keep your urine clear or pale yellow. Water, juice, or caffeine-free drinks are recommended. Not drinking enough fluid may cause constipation.   Eat a variety of high-fiber foods rather than one type of fiber.   Try to increase your intake of fiber through using high-fiber foods rather than fiber pills or supplements that contain small amounts of fiber.   The goal is to change the types of food eaten. Do not supplement your present diet with high-fiber foods, but replace foods in your present diet.    INCLUDE A VARIETY OF FIBER SOURCES  Replace refined and processed grains with whole grains, canned fruits with fresh fruits, and incorporate other fiber sources. White rice, white breads, and most bakery goods contain little or no fiber.   Brown whole-grain rice, buckwheat oats, and many fruits and vegetables are all good sources of fiber. These include: broccoli, Brussels sprouts, cabbage, cauliflower, beets, sweet potatoes, white potatoes (skin on), carrots, tomatoes, eggplant, squash, berries, fresh fruits, and dried fruits.   Cereals appear to be the richest source of fiber. Cereal fiber is found in whole grains and bran. Bran is the fiber-rich outer coat of cereal grain, which is largely removed in refining. In whole-grain cereals, the bran remains. In breakfast cereals, the largest amount of fiber is found in those with "bran" in their names. The fiber content is sometimes indicated on the label.   You may need to include additional fruits and vegetables each day.   In baking, for 1 cup white flour, you may use the following substitutions:  1 cup whole-wheat flour minus 2 tablespoons.   1/2 cup white flour plus 1/2 cup whole-wheat flour.

## 2013-02-08 NOTE — Assessment & Plan Note (Signed)
MILDLY SX  OFFERED BASW. PT DECLINED AT THIS TIME. OPV IN 6 MOS

## 2013-02-13 NOTE — Progress Notes (Signed)
REMINDER APPT MADE 

## 2013-04-25 ENCOUNTER — Ambulatory Visit: Payer: Self-pay | Admitting: Family Medicine

## 2013-04-25 ENCOUNTER — Encounter: Payer: Self-pay | Admitting: Cardiovascular Disease

## 2013-04-25 ENCOUNTER — Ambulatory Visit (INDEPENDENT_AMBULATORY_CARE_PROVIDER_SITE_OTHER): Payer: Medicare PPO | Admitting: Cardiovascular Disease

## 2013-04-25 VITALS — BP 174/90 | HR 73 | Ht 62.0 in | Wt 127.0 lb

## 2013-04-25 DIAGNOSIS — I4891 Unspecified atrial fibrillation: Secondary | ICD-10-CM

## 2013-04-25 DIAGNOSIS — I10 Essential (primary) hypertension: Secondary | ICD-10-CM

## 2013-04-25 DIAGNOSIS — E785 Hyperlipidemia, unspecified: Secondary | ICD-10-CM

## 2013-04-25 DIAGNOSIS — I080 Rheumatic disorders of both mitral and aortic valves: Secondary | ICD-10-CM

## 2013-04-25 MED ORDER — LOSARTAN POTASSIUM 25 MG PO TABS: 25.0000 mg | ORAL_TABLET | Freq: Every day | ORAL | Status: DC

## 2013-04-25 NOTE — Assessment & Plan Note (Signed)
Mild No change in murmur Echo if symptoms change

## 2013-04-25 NOTE — Assessment & Plan Note (Signed)
Cholesterol is at goal.  Continue current dose of statin and diet Rx.  No myalgias or side effects.  F/U  LFT's in 6 months. Lab Results  Component Value Date   LDLCALC 72 09/28/2012

## 2013-04-25 NOTE — Assessment & Plan Note (Signed)
Labile Will d/c atenolol and try cozaar 25 mg and see if she tolerates it.

## 2013-04-25 NOTE — Addendum Note (Signed)
Addended by: Derry Lory A on: 04/25/2013 11:58 AM   Modules accepted: Orders

## 2013-04-25 NOTE — Progress Notes (Signed)
Patient ID: Norma Barajas, female   DOB: October 05, 1941, 72 y.o.   MRN: [id number] Norma Barajas is seen today ofr F/U of fatigue, MR, abnomrla ECG and CRF's of elevated lipids and HTN> See A/P for ECG issues with misplaced V1 lead at [facility] No SSCP or previously documented CAD. Previous echo 2006 with only mild MR. No CAD by cath in 2006. Occasional flip flops but no sustained palpitaitons. Comploant with BP and cholesterol meds. Not very active with fatigue and overweight.  Hospitalized at [facility] 11/21 with PAF Seen by Dr Norma Barajas. Converted with cardizem. Relative bradycardia and atenolol decreased. Sent home on amiodarone. F/U echo with normal EF and only mild MR  Amiodarone not tolerated well with abdominal pain and diarhea Stopped 12/13  Lots of issues with dizzyness and inner ear. Getting MRI in am Also some GI issues Last 2 months some chest pain. GI overtones with burnign and belching. Does not want myovue  Discussed using husbands nitro and see if it helps. "stress" causes pain as   BP fluctuates but seems high.  She only takes atenolol as needed  ROS: Denies fever, malais, weight loss, blurry vision, decreased visual acuity, cough, sputum, SOB, hemoptysis, pleuritic pain, palpitaitons, heartburn, abdominal pain, melena, lower extremity edema, claudication, or rash.  All other systems reviewed and negative  General: Affect appropriate Healthy:  appears stated age HEENT: normal Neck supple with no adenopathy JVP normal no bruits no thyromegaly Lungs clear with no wheezing and good diaphragmatic motion Heart:  S1/S2 SEM  murmur, no rub, gallop or click PMI normal Abdomen: benighn, BS positve, no tenderness, no AAA no bruit.  No HSM or HJR Distal pulses intact with no bruits No edema  Venous insuf in both LE  Neuro non-focal Skin warm and dry No muscular weakness   Current Outpatient Prescriptions  Medication Sig Dispense Refill  . ALPRAZolam (XANAX) 0.25 MG tablet Take 0.25 mg by mouth 2 (two) times  daily as needed. For anxiety/nerves      . aspirin 81 MG tablet Take 81 mg by mouth daily.      Marland Kitchen atenolol (TENORMIN) 50 MG tablet Take 50 mg by mouth at bedtime. The dose has been changed to half of a tablet twice daily.      . calcium citrate-vitamin D 200-200 MG-UNIT TABS Take 1 tablet by mouth every morning.       . Multiple Vitamin (MULTIVITAMIN) capsule Take 1 capsule by mouth every morning.       Marland Kitchen omeprazole (PRILOSEC) 20 MG capsule Take 1 capsule (20 mg total) by mouth 2 (two) times daily.  62 capsule  11  . simvastatin (ZOCOR) 20 MG tablet Take 20 mg by mouth at bedtime.         No current facility-administered medications for this visit.    Allergies  Crestor; Iodine; Iohexol; Morphine; Risedronate sodium; and Triamterene-hctz  Electrocardiogram:  Assessment and Plan

## 2013-04-25 NOTE — Assessment & Plan Note (Signed)
Maint NSR with no palpitaitons Stable 

## 2013-04-25 NOTE — Patient Instructions (Addendum)
Your physician recommends that you schedule a follow-up appointment in: 3 months  Your physician has recommended you make the following change in your medication:  1) START COZAAR 25MG  ONCE DAILY 2) STOP TAKING ATENOLOL

## 2013-05-04 ENCOUNTER — Telehealth: Payer: Self-pay | Admitting: Cardiovascular Disease

## 2013-05-04 NOTE — Telephone Encounter (Signed)
Patient states she is having reaction to medications / tgs

## 2013-05-04 NOTE — Telephone Encounter (Signed)
Pt family member advised pt is not at home right now, advised to call pt at [phone], Called pt to confirm current signs and symptoms as stomach pain started this am, also noted a burning in her chest, and dizzyness, pt notes the side effects for losartan she recently started on 04-25-13 as chest burning, pt denies chest pain/SOB/swelling/headaches. Please advise

## 2013-05-07 NOTE — Telephone Encounter (Signed)
SPOKE WITH PT AGAIN  ,PER PT  IS NOT TAKING AMLODIPINE  HAS TAKEN IN PAST AND STOPPED BECAUSE B/P WAS NOT ALWAYS HIGH .  INSTRUCTED PT  TO  TAKE  AMLODIPINE  10 MG 1/2 TAB  AND TO STOP  ATENOLOL AS PT RESTARTED ONCE  LOSARTAN WAS STOPPED ALSO INSTRUCTED PT TO  MONITOR B/P AND CALL WITH READINGS IN 7-10 DAYS PT VERBALIZED UNDERSTANDING .Norma Barajas

## 2013-05-07 NOTE — Telephone Encounter (Signed)
Follow up  ° ° ° ° °Pt is returning your call  °

## 2013-05-07 NOTE — Telephone Encounter (Signed)
PER PT   THINKS SHE IS ALREADY TAKING AMLODIPINE  10 MG  WILL CALL  OFFICE , ONCE SHE GETS HOME AND  CAN REVIEW  HER MEDS  .Zack Seal

## 2013-05-07 NOTE — Telephone Encounter (Signed)
Can stop cozaar and try amlodipine 5 mg for BP

## 2013-05-08 ENCOUNTER — Other Ambulatory Visit: Payer: Medicare HMO

## 2013-05-14 ENCOUNTER — Ambulatory Visit (INDEPENDENT_AMBULATORY_CARE_PROVIDER_SITE_OTHER): Payer: Medicare HMO | Admitting: Family Medicine

## 2013-05-14 ENCOUNTER — Encounter: Payer: Self-pay | Admitting: Family Medicine

## 2013-05-14 VITALS — BP 151/81 | HR 65 | Temp 97.9°F | Wt 126.6 lb

## 2013-05-14 DIAGNOSIS — I080 Rheumatic disorders of both mitral and aortic valves: Secondary | ICD-10-CM

## 2013-05-14 DIAGNOSIS — I10 Essential (primary) hypertension: Secondary | ICD-10-CM

## 2013-05-14 DIAGNOSIS — E785 Hyperlipidemia, unspecified: Secondary | ICD-10-CM

## 2013-05-14 DIAGNOSIS — F411 Generalized anxiety disorder: Secondary | ICD-10-CM

## 2013-05-14 DIAGNOSIS — K219 Gastro-esophageal reflux disease without esophagitis: Secondary | ICD-10-CM

## 2013-05-14 DIAGNOSIS — M810 Age-related osteoporosis without current pathological fracture: Secondary | ICD-10-CM

## 2013-05-14 DIAGNOSIS — I4891 Unspecified atrial fibrillation: Secondary | ICD-10-CM

## 2013-05-14 MED ORDER — ATENOLOL 50 MG PO TABS: 50.0000 mg | ORAL_TABLET | Freq: Two times a day (BID) | ORAL | Status: DC

## 2013-05-14 MED ORDER — OMEPRAZOLE 20 MG PO CPDR: 20.0000 mg | DELAYED_RELEASE_CAPSULE | Freq: Two times a day (BID) | ORAL | Status: DC

## 2013-05-14 MED ORDER — SIMVASTATIN 40 MG PO TABS: 40.0000 mg | ORAL_TABLET | Freq: Every evening | ORAL | Status: DC

## 2013-05-14 MED ORDER — ALPRAZOLAM 0.25 MG PO TABS: 0.2500 mg | ORAL_TABLET | Freq: Two times a day (BID) | ORAL | Status: DC | PRN

## 2013-05-14 NOTE — Progress Notes (Signed)
Patient ID: Norma Barajas, female   DOB: 05-29-1941, 72 y.o.   MRN: [id number] SUBJECTIVE: CC: Chief Complaint  Patient presents with  . Follow-up    3 month follow up   needs mail order rx and rx for xanax   HPI: Doing very well. Came for Rx refills and routine follow up. Patient is here for follow up of hyperlipidemia/hypertension/GERD/anxiety: denies Headache;denies Chest Pain;denies weakness;denies Shortness of Breath and orthopnea;denies Visual changes;denies palpitations;denies cough;denies pedal edema;denies symptoms of TIA or stroke;deniesClaudication symptoms. admits to Compliance with medications; denies Problems with medications.  Past Medical History  Diagnosis Date  . Hypertension   . Mitral regurgitation     Mild 2011 with moderate LAD  . Erosive esophagitis   . GERD (gastroesophageal reflux disease)   . Hypercholesterolemia   . History of cardiac catheterization December 2006    Nonobstructive CAD  . Atrial fibrillation with RVR 09/28/2012    Appears to be paroxysmal. Sinus brady at D/C.  Marland Kitchen Diastolic dysfunction 09/29/2012.    Grade 1. Ejection fraction 60-65%.   Past Surgical History  Procedure Laterality Date  . Abdominal hysterectomy    . Prolapsed uterine fibroid ligation    . Cataract extraction    . Steel plate in the leg      broke tibia and fibula after falling down stairs  . Esophagogastroduodenoscopy  2001    Dr. Arlyce Dice: erosive esophagitis, esophageal stricture, duodenitis, s/p Savary dilation  . Colonoscopy  2008    Dr. Darrick Penna: internal hemorrhoids   . 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 Bravo ph study  11/15/2012    Procedure: BRAVO PH STUDY;  Surgeon: West Bali, MD;  Location: [facility];  Service: Endoscopy;;   History   Social History  . Marital Status: Married    Spouse Name: N/A     Number of Children: N/A  . Years of Education: N/A   Occupational History  . Retired     Press photographer   Social History Main Topics  . Smoking status: Never Smoker   . Smokeless tobacco: Not on file  . Alcohol Use: No  . Drug Use: No  . Sexually Active: Not on file   Other Topics Concern  . Not on file   Social History Narrative   Married   No regular exercise   Family History  Problem Relation Age of Onset  . Colon cancer Mother     diagnosed at age 53   Current Outpatient Prescriptions on File Prior to Visit  Medication Sig Dispense Refill  . aspirin 81 MG tablet Take 81 mg by mouth daily.      . calcium citrate-vitamin D 200-200 MG-UNIT TABS Take 1 tablet by mouth every morning.       . Multiple Vitamin (MULTIVITAMIN) capsule Take 1 capsule by mouth every morning.        No current facility-administered medications on file prior to visit.   Allergies  Allergen Reactions  . Crestor (Rosuvastatin Calcium) Other (See Comments)    weakness  . Iodine     ivp dye  . Iohexol   . Morphine   . Risedronate Sodium Other (See Comments)    ACTONEL REACTION: reflux  . Triamterene-Hctz Other (See Comments)    weakness   Immunization History  Administered Date(s) Administered  . DTaP 08/27/2009  . Influenza  Whole 08/04/2010   Prior to Admission medications   Medication Sig Start Date End Date Taking? Authorizing Provider  ALPRAZolam (XANAX) 0.25 MG tablet Take 1 tablet (0.25 mg total) by mouth 2 (two) times daily as needed for sleep or anxiety. For anxiety/nerves 05/14/13  Yes Ileana Ladd, MD  aspirin 81 MG tablet Take 81 mg by mouth daily.   Yes Historical Provider, MD  atenolol (TENORMIN) 50 MG tablet Take 1 tablet (50 mg total) by mouth 2 (two) times daily before a meal. 05/14/13  Yes Ileana Ladd, MD  calcium citrate-vitamin D 200-200 MG-UNIT TABS Take 1 tablet by mouth every morning.    Yes Historical Provider, MD  Multiple Vitamin (MULTIVITAMIN) capsule Take 1  capsule by mouth every morning.    Yes Historical Provider, MD  omeprazole (PRILOSEC) 20 MG capsule Take 1 capsule (20 mg total) by mouth 2 (two) times daily. 05/14/13  Yes Ileana Ladd, MD  simvastatin (ZOCOR) 40 MG tablet Take 1 tablet (40 mg total) by mouth every evening. 05/14/13  Yes Ileana Ladd, MD     ROS: As above in the HPI. All other systems are stable or negative.  OBJECTIVE: APPEARANCE:  Patient in no acute distress.The patient appeared well nourished and normally developed. Acyanotic. Waist: VITAL SIGNS:BP 151/81  Pulse 65  Temp(Src) 97.9 F (36.6 C) (Oral)  Wt 126 lb 9.6 oz (57.425 kg)  BMI 23.15 kg/m2 Recheck BP 135/75 WF Kyphosis  SKIN: warm and  Dry without overt rashes, tattoos and scars  HEAD and Neck: without JVD, Head and scalp: normal Eyes:No scleral icterus. Fundi normal, eye movements normal. Ears: Auricle normal, canal normal, Tympanic membranes normal, insufflation normal. Nose: normal Throat: normal Neck & thyroid: normal  CHEST & LUNGS: Chest wall: normal Lungs: Clear  CVS: Reveals the PMI to be normally located. Regular rhythm, First and Second Heart sounds are normal,  absence of murmurs, rubs or gallops. Peripheral vasculature: Radial pulses: normal  ABDOMEN:  Appearance: normal Benign, no organomegaly, no masses, no Abdominal Aortic enlargement. No Guarding , no rebound. No Bruits. Bowel sounds: normal  RECTAL: N/A GU: N/A  EXTREMETIES: nonedematous. Both Femoral and Pedal pulses are normal.  MUSCULOSKELETAL:  Spine: normal Joints: intact  NEUROLOGIC: oriented to time,place and person; nonfocal. Strength is normal Sensory is normal Reflexes are normal Cranial Nerves are normal.  ASSESSMENT: OSTEOPOROSIS - Plan: Vitamin D 25 hydroxy  MITRAL REGURGITATION  HYPERTENSION - Plan: COMPLETE METABOLIC PANEL WITH GFR, atenolol (TENORMIN) 50 MG tablet, CANCELED: COMPLETE METABOLIC PANEL WITH GFR  HYPERLIPIDEMIA - Plan:  COMPLETE METABOLIC PANEL WITH GFR, NMR Lipoprofile with Lipids, simvastatin (ZOCOR) 40 MG tablet, CANCELED: COMPLETE METABOLIC PANEL WITH GFR, CANCELED: NMR Lipoprofile with Lipids  GERD - Plan: omeprazole (PRILOSEC) 20 MG capsule, DISCONTINUED: omeprazole (PRILOSEC) 20 MG capsule  Atrial fibrillation with RVR - Plan: atenolol (TENORMIN) 50 MG tablet  ANXIETY - Plan: ALPRAZolam (XANAX) 0.25 MG tablet   PLAN: Orders Placed This Encounter  Procedures  . COMPLETE METABOLIC PANEL WITH GFR    Standing Status: Future     Number of Occurrences:      Standing Expiration Date: 05/14/2014  . NMR Lipoprofile with Lipids    Standing Status: Future     Number of Occurrences:      Standing Expiration Date: 05/14/2014  . Vitamin D 25 hydroxy    Standing Status: Future     Number of Occurrences:      Standing Expiration Date: 05/14/2014   Meds  ordered this encounter  Medications  . DISCONTD: atenolol (TENORMIN) 50 MG tablet    Sig: Take 50 mg by mouth 2 (two) times daily before a meal.  . DISCONTD: simvastatin (ZOCOR) 40 MG tablet    Sig: Take 40 mg by mouth every evening.  Marland Kitchen ALPRAZolam (XANAX) 0.25 MG tablet    Sig: Take 1 tablet (0.25 mg total) by mouth 2 (two) times daily as needed for sleep or anxiety. For anxiety/nerves    Dispense:  60 tablet    Refill:  3  . DISCONTD: omeprazole (PRILOSEC) 20 MG capsule    Sig: Take 1 capsule (20 mg total) by mouth 2 (two) times daily.    Dispense:  180 capsule    Refill:  3  . simvastatin (ZOCOR) 40 MG tablet    Sig: Take 1 tablet (40 mg total) by mouth every evening.    Dispense:  90 tablet    Refill:  3  . atenolol (TENORMIN) 50 MG tablet    Sig: Take 1 tablet (50 mg total) by mouth 2 (two) times daily before a meal.    Dispense:  180 tablet    Refill:  3  . omeprazole (PRILOSEC) 20 MG capsule    Sig: Take 1 capsule (20 mg total) by mouth 2 (two) times daily.    Dispense:  180 capsule    Refill:  3   Return in about 4 months (around 09/14/2013)  for Recheck medical problems.  Bayard More P. Modesto Charon, M.D.

## 2013-05-15 ENCOUNTER — Other Ambulatory Visit (INDEPENDENT_AMBULATORY_CARE_PROVIDER_SITE_OTHER): Payer: Medicare HMO

## 2013-05-15 DIAGNOSIS — I10 Essential (primary) hypertension: Secondary | ICD-10-CM

## 2013-05-15 DIAGNOSIS — M810 Age-related osteoporosis without current pathological fracture: Secondary | ICD-10-CM

## 2013-05-15 DIAGNOSIS — E785 Hyperlipidemia, unspecified: Secondary | ICD-10-CM

## 2013-05-15 LAB — COMPLETE METABOLIC PANEL WITH GFR
ALT: 8 U/L (ref 0–35)
AST: 18 U/L (ref 0–37)
Albumin: 4.2 g/dL (ref 3.5–5.2)
Alkaline Phosphatase: 70 U/L (ref 39–117)
BUN: 10 mg/dL (ref 6–23)
CO2: 29 mEq/L (ref 19–32)
Calcium: 9.1 mg/dL (ref 8.4–10.5)
Chloride: 104 mEq/L (ref 96–112)
Creat: 0.96 mg/dL (ref 0.50–1.10)
GFR, Est African American: 67 mL/min
GFR, Est Non African American: 58 mL/min — ABNORMAL LOW
Glucose, Bld: 89 mg/dL (ref 70–99)
Potassium: 4.1 mEq/L (ref 3.5–5.3)
Sodium: 142 mEq/L (ref 135–145)
Total Bilirubin: 0.4 mg/dL (ref 0.3–1.2)
Total Protein: 7.1 g/dL (ref 6.0–8.3)

## 2013-05-16 LAB — NMR LIPOPROFILE WITH LIPIDS
Cholesterol, Total: 192 mg/dL (ref <200)
HDL Particle Number: 31.8 umol/L (ref >=30.5)
HDL Size: 8.5 nm — ABNORMAL LOW (ref >=9.2)
HDL-C: 44 mg/dL (ref >=40)
LDL (calc): 112 mg/dL — ABNORMAL HIGH (ref <100)
LDL Particle Number: 1776 nmol/L — ABNORMAL HIGH (ref <1000)
LDL Size: 20.1 nm — ABNORMAL LOW (ref >20.5)
LP-IR Score: 70 — ABNORMAL HIGH (ref <=45)
Large HDL-P: 2.1 umol/L — ABNORMAL LOW (ref >=4.8)
Large VLDL-P: 3.4 nmol/L — ABNORMAL HIGH (ref <=2.7)
Small LDL Particle Number: 1222 nmol/L — ABNORMAL HIGH (ref <=527)
Triglycerides: 179 mg/dL — ABNORMAL HIGH (ref <150)
VLDL Size: 45.7 nm (ref <=46.6)

## 2013-05-16 LAB — VITAMIN D 25 HYDROXY (VIT D DEFICIENCY, FRACTURES): Vit D, 25-Hydroxy: 59 ng/mL (ref 30–89)

## 2013-05-16 NOTE — Progress Notes (Signed)
Patient came in for labs only.

## 2013-05-20 ENCOUNTER — Other Ambulatory Visit: Payer: Self-pay | Admitting: Family Medicine

## 2013-05-20 DIAGNOSIS — E785 Hyperlipidemia, unspecified: Secondary | ICD-10-CM

## 2013-05-20 MED ORDER — EZETIMIBE 10 MG PO TABS: 10.0000 mg | ORAL_TABLET | Freq: Every day | ORAL | Status: DC

## 2013-07-04 ENCOUNTER — Ambulatory Visit: Payer: Medicare PPO | Admitting: Cardiovascular Disease

## 2013-07-23 ENCOUNTER — Encounter (HOSPITAL_COMMUNITY): Payer: Self-pay | Admitting: Emergency Medicine

## 2013-07-23 ENCOUNTER — Emergency Department (HOSPITAL_COMMUNITY): Payer: Medicare PPO

## 2013-07-23 ENCOUNTER — Observation Stay (HOSPITAL_COMMUNITY)
Admission: EM | Admit: 2013-07-23 | Discharge: 2013-07-24 | Disposition: A | Discharge status: Home / Self Care | Payer: Medicare PPO | Attending: Internal Medicine | Admitting: Internal Medicine

## 2013-07-23 DIAGNOSIS — R51 Headache: Secondary | ICD-10-CM | POA: Insufficient documentation

## 2013-07-23 DIAGNOSIS — R739 Hyperglycemia, unspecified: Secondary | ICD-10-CM

## 2013-07-23 DIAGNOSIS — R002 Palpitations: Principal | ICD-10-CM | POA: Insufficient documentation

## 2013-07-23 DIAGNOSIS — R110 Nausea: Secondary | ICD-10-CM | POA: Insufficient documentation

## 2013-07-23 DIAGNOSIS — E785 Hyperlipidemia, unspecified: Secondary | ICD-10-CM | POA: Insufficient documentation

## 2013-07-23 DIAGNOSIS — I10 Essential (primary) hypertension: Secondary | ICD-10-CM | POA: Insufficient documentation

## 2013-07-23 DIAGNOSIS — I4891 Unspecified atrial fibrillation: Secondary | ICD-10-CM | POA: Insufficient documentation

## 2013-07-23 DIAGNOSIS — Z79899 Other long term (current) drug therapy: Secondary | ICD-10-CM | POA: Insufficient documentation

## 2013-07-23 DIAGNOSIS — F411 Generalized anxiety disorder: Secondary | ICD-10-CM | POA: Insufficient documentation

## 2013-07-23 DIAGNOSIS — K219 Gastro-esophageal reflux disease without esophagitis: Secondary | ICD-10-CM | POA: Insufficient documentation

## 2013-07-23 DIAGNOSIS — R4789 Other speech disturbances: Secondary | ICD-10-CM | POA: Insufficient documentation

## 2013-07-23 DIAGNOSIS — I480 Paroxysmal atrial fibrillation: Secondary | ICD-10-CM

## 2013-07-23 DIAGNOSIS — I2510 Atherosclerotic heart disease of native coronary artery without angina pectoris: Secondary | ICD-10-CM | POA: Insufficient documentation

## 2013-07-23 DIAGNOSIS — Z7982 Long term (current) use of aspirin: Secondary | ICD-10-CM | POA: Insufficient documentation

## 2013-07-23 DIAGNOSIS — R079 Chest pain, unspecified: Secondary | ICD-10-CM | POA: Insufficient documentation

## 2013-07-23 DIAGNOSIS — R7309 Other abnormal glucose: Secondary | ICD-10-CM | POA: Insufficient documentation

## 2013-07-23 DIAGNOSIS — I059 Rheumatic mitral valve disease, unspecified: Secondary | ICD-10-CM | POA: Insufficient documentation

## 2013-07-23 HISTORY — DX: Paroxysmal atrial fibrillation: I48.0

## 2013-07-23 HISTORY — DX: Atherosclerotic heart disease of native coronary artery without angina pectoris: I25.10

## 2013-07-23 LAB — CBC WITH DIFFERENTIAL/PLATELET
Eosinophils Absolute: 0 10*3/uL (ref 0.0–0.7)
Eosinophils Relative: 0 % (ref 0–5)
HCT: 40.6 % (ref 36.0–46.0)
Hemoglobin: 13.3 g/dL (ref 12.0–15.0)
Lymphs Abs: 1.3 10*3/uL (ref 0.7–4.0)
MCH: 26.4 pg (ref 26.0–34.0)
MCHC: 32.8 g/dL (ref 30.0–36.0)
MCV: 80.6 fL (ref 78.0–100.0)
Monocytes Absolute: 0.5 10*3/uL (ref 0.1–1.0)
Monocytes Relative: 6 % (ref 3–12)
Neutrophils Relative %: 76 % (ref 43–77)
RBC: 5.04 MIL/uL (ref 3.87–5.11)

## 2013-07-23 LAB — BASIC METABOLIC PANEL
BUN: 11 mg/dL (ref 6–23)
CO2: 26 mEq/L (ref 19–32)
Glucose, Bld: 199 mg/dL — ABNORMAL HIGH (ref 70–99)
Potassium: 3 mEq/L — ABNORMAL LOW (ref 3.5–5.1)
Sodium: 137 mEq/L (ref 135–145)

## 2013-07-23 MED ORDER — NITROGLYCERIN 0.4 MG SL SUBL
SUBLINGUAL_TABLET | SUBLINGUAL | Status: AC
  Filled 2013-07-23: qty 25

## 2013-07-23 MED ORDER — POTASSIUM CHLORIDE CRYS ER 20 MEQ PO TBCR
40.0000 meq | EXTENDED_RELEASE_TABLET | Freq: Once | ORAL | Status: AC
  Administered 2013-07-23: 40 meq via ORAL
  Filled 2013-07-23: qty 2

## 2013-07-23 MED ORDER — ASPIRIN 81 MG PO CHEW
324.0000 mg | CHEWABLE_TABLET | Freq: Once | ORAL | Status: AC
  Administered 2013-07-23: 324 mg via ORAL
  Filled 2013-07-23: qty 4

## 2013-07-23 MED ORDER — NITROGLYCERIN 0.4 MG SL SUBL
0.4000 mg | SUBLINGUAL_TABLET | SUBLINGUAL | Status: DC | PRN
  Administered 2013-07-23: 0.4 mg via SUBLINGUAL

## 2013-07-23 NOTE — ED Provider Notes (Signed)
CSN: [id number]     Arrival date & time 07/23/13  1936 History  This chart was scribed for Donnetta Hutching, MD by Arlan Organ, ED Scribe. This patient was seen in room [facility] and the patient's care was started 7:57 PM.     Chief Complaint  Patient presents with  . Chest Pain  . Nausea   The history is provided by the patient and a relative. No language interpreter was used.   HPI Comments: Norma Barajas is a 72 y.o. female with a history of atrial fibrillation, hypercholesterolemia,  HTN, and heart murmurs who presents to the Emergency Department complaining of sudden onset, constant centralized chest pain with associated dyspnea, diaphoresis, emesis, and nausea that started about 4 hours ago. Pt describes the pain as tightness. Pt states the tightness feeling has not subsided since she has been here in the ED. Pt denies ever taking nitroglycerin for her CP. Pt states she has been under a lot of stress lately related to family members being ill.  Pt denies diabetes, or previous MI. Pt has a history of cardiac catheterization in December of 2006. Pt takes a baby aspirin daily.  PCP- Dr. Rudi Heap Dr. Charlton Haws  Past Medical History  Diagnosis Date  . Hypertension   . Mitral regurgitation     Mild 2011 with moderate LAD  . Erosive esophagitis   . GERD (gastroesophageal reflux disease)   . Hypercholesterolemia   . History of cardiac catheterization December 2006    Nonobstructive CAD  . Atrial fibrillation with RVR 09/28/2012    Appears to be paroxysmal. Sinus brady at D/C.  Marland Kitchen Diastolic dysfunction 09/29/2012.    Grade 1. Ejection fraction 60-65%.   Past Surgical History  Procedure Laterality Date  . Abdominal hysterectomy    . Prolapsed uterine fibroid ligation    . Cataract extraction    . Steel plate in the leg      broke tibia and fibula after falling down stairs  . Esophagogastroduodenoscopy  2001    Dr. Arlyce Dice: erosive esophagitis, esophageal stricture, duodenitis, s/p  Savary dilation  . Colonoscopy  2008    Dr. Darrick Penna: internal hemorrhoids   . Esophagogastroduodenoscopy (egd) with esophageal dilation  11/15/2012    YQM:[id number] web was found & MOST LIKELY CAUSE FOR DYAPHAGIA/Polyp was found in the gastric body and gastric fundus  . Bravo ph study  11/15/2012    Procedure: BRAVO PH STUDY;  Surgeon: West Bali, MD;  Location: [facility];  Service: Endoscopy;;   Family History  Problem Relation Age of Onset  . Colon cancer Mother     diagnosed at age 2   History  Substance Use Topics  . Smoking status: Never Smoker   . Smokeless tobacco: Not on file  . Alcohol Use: No   OB History   Grav Para Term Preterm Abortions TAB SAB Ect Mult Living                 Review of Systems A complete 10 system review of systems was obtained and all systems are negative except as noted in the HPI and PMH.  Allergies  Crestor; Iodine; Iohexol; Morphine; Risedronate sodium; and Triamterene-hctz  Home Medications   Current Outpatient Rx  Name  Route  Sig  Dispense  Refill  . ALPRAZolam (XANAX) 0.25 MG tablet   Oral   Take 1 tablet (0.25 mg total) by mouth 2 (two) times daily as needed for sleep or anxiety. For anxiety/nerves  60 tablet   3   . aspirin 81 MG tablet   Oral   Take 81 mg by mouth daily.         Marland Kitchen atenolol (TENORMIN) 50 MG tablet   Oral   Take 1 tablet (50 mg total) by mouth 2 (two) times daily before a meal.   180 tablet   3   . calcium citrate-vitamin D 200-200 MG-UNIT TABS   Oral   Take 1 tablet by mouth every morning.          . ezetimibe (ZETIA) 10 MG tablet   Oral   Take 1 tablet (10 mg total) by mouth daily.   30 tablet   5   . Multiple Vitamin (MULTIVITAMIN) capsule   Oral   Take 1 capsule by mouth every morning.          Marland Kitchen omeprazole (PRILOSEC) 20 MG capsule   Oral   Take 1 capsule (20 mg total) by mouth 2 (two) times daily.   180 capsule   3   . simvastatin (ZOCOR) 40 MG tablet   Oral   Take 1 tablet  (40 mg total) by mouth every evening.   90 tablet   3    BP 183/86  Pulse 81  Temp(Src) 97.6 F (36.4 C) (Oral)  Resp 17  SpO2 94% Physical Exam  Nursing note and vitals reviewed. Constitutional: She is oriented to person, place, and time. She appears well-developed and well-nourished.  HENT:  Head: Normocephalic and atraumatic.  Eyes: Conjunctivae and EOM are normal. Pupils are equal, round, and reactive to light.  Neck: Normal range of motion. Neck supple.  Cardiovascular: Normal rate, regular rhythm and normal heart sounds.   Pulmonary/Chest: Effort normal and breath sounds normal.  Abdominal: Soft. Bowel sounds are normal.  Musculoskeletal: Normal range of motion.  Neurological: She is alert and oriented to person, place, and time.  Skin: Skin is warm and dry. There is pallor.  Pt appears pale.  Psychiatric: She has a normal mood and affect.    ED Course  Procedures (including critical care time)  DIAGNOSTIC STUDIES: Oxygen Saturation is 94% on RA, Adequate by my interpretation.    COORDINATION OF CARE: 7:56 PM- Will order EKG, lab work, chest x-ray. Pt will be given nitroglycerin and aspirin. Pt will be admitted, possibly in [facility] for angina/heart related problems. Discussed treatment plan with pt at bedside and pt agreed to plan.     Labs Review Labs Reviewed  BASIC METABOLIC PANEL  CBC WITH DIFFERENTIAL  TROPONIN I   Imaging Review No results found. Results for orders placed during the hospital encounter of 07/23/13  CBC WITH DIFFERENTIAL      Result Value Range   WBC 7.8  4.0 - 10.5 K/uL   RBC 5.04  3.87 - 5.11 MIL/uL   Hemoglobin 13.3  12.0 - 15.0 g/dL   HCT 40.9  81.1 - 91.4 %   MCV 80.6  78.0 - 100.0 fL   MCH 26.4  26.0 - 34.0 pg   MCHC 32.8  30.0 - 36.0 g/dL   RDW 78.2 (*) 95.6 - 21.3 %   Platelets 211  150 - 400 K/uL   Neutrophils Relative % 76  43 - 77 %   Neutro Abs 6.0  1.7 - 7.7 K/uL   Lymphocytes Relative 17  12 - 46 %   Lymphs Abs  1.3  0.7 - 4.0 K/uL   Monocytes Relative 6  3 - 12 %  Monocytes Absolute 0.5  0.1 - 1.0 K/uL   Eosinophils Relative 0  0 - 5 %   Eosinophils Absolute 0.0  0.0 - 0.7 K/uL   Basophils Relative 1  0 - 1 %   Basophils Absolute 0.0  0.0 - 0.1 K/uL  TROPONIN I      Result Value Range   Troponin I <0.30  <0.30 ng/mL   Dg Chest Port 1 View  07/23/2013   CLINICAL DATA:  Chest pain and nausea  EXAM: PORTABLE CHEST - 1 VIEW  COMPARISON:  11/03/2012  FINDINGS: The cardiac silhouette is normal in size and configuration. The mediastinum is normal in contour. There is mild prominence of the right hilum that appears vascular. It is stable.  There are prominent bronchovascular markings, but no overt pulmonary edema. Mild scarring or subsegmental atelectasis is noted in the left lung base. No evidence of an infiltrate.  No pleural effusion or pneumothorax.  The bony thorax is demineralized but grossly intact.  IMPRESSION: No acute cardiopulmonary disease.   Electronically Signed   By: Amie Portland   On: 07/23/2013 20:21      Date: 07/23/2013  Rate: 82  Rhythm: normal sinus rhythm  QRS Axis: normal  Intervals: normal  ST/T Wave abnormalities: normal  Conduction Disutrbances: Prolonged QT  Narrative Interpretation: unremarkable PAC   MDM  No diagnosis found. Patient has good history for angina and several cardiac risk factors. Discussed with cardiologist in [place]. Will order second troponin at approximately 0045.   If troponin positive, transfer to [place]. If troponin negative, admit to [facility].  Discussed with dr Preston Fleeting  I personally performed the services described in this documentation, which was scribed in my presence. The recorded information has been reviewed and is accurate.    Donnetta Hutching, MD 07/27/13 2036

## 2013-07-23 NOTE — ED Notes (Signed)
Pt c/o palpitations and enroute to hospital started having chest pain. Pt is vomiting at this time.

## 2013-07-24 ENCOUNTER — Encounter: Payer: Self-pay | Admitting: *Deleted

## 2013-07-24 ENCOUNTER — Encounter (HOSPITAL_COMMUNITY): Payer: Self-pay | Admitting: Internal Medicine

## 2013-07-24 DIAGNOSIS — K219 Gastro-esophageal reflux disease without esophagitis: Secondary | ICD-10-CM

## 2013-07-24 DIAGNOSIS — R7309 Other abnormal glucose: Secondary | ICD-10-CM

## 2013-07-24 DIAGNOSIS — I10 Essential (primary) hypertension: Secondary | ICD-10-CM | POA: Diagnosis present

## 2013-07-24 DIAGNOSIS — R739 Hyperglycemia, unspecified: Secondary | ICD-10-CM | POA: Diagnosis present

## 2013-07-24 DIAGNOSIS — I4891 Unspecified atrial fibrillation: Secondary | ICD-10-CM

## 2013-07-24 DIAGNOSIS — R079 Chest pain, unspecified: Secondary | ICD-10-CM | POA: Diagnosis present

## 2013-07-24 DIAGNOSIS — I2510 Atherosclerotic heart disease of native coronary artery without angina pectoris: Secondary | ICD-10-CM

## 2013-07-24 LAB — BASIC METABOLIC PANEL
BUN: 9 mg/dL (ref 6–23)
CO2: 28 mEq/L (ref 19–32)
Calcium: 9.3 mg/dL (ref 8.4–10.5)
Creatinine, Ser: 0.69 mg/dL (ref 0.50–1.10)
GFR calc non Af Amer: 84 mL/min — ABNORMAL LOW (ref >90)
Glucose, Bld: 108 mg/dL — ABNORMAL HIGH (ref 70–99)

## 2013-07-24 LAB — MAGNESIUM: Magnesium: 2 mg/dL (ref 1.5–2.5)

## 2013-07-24 LAB — TROPONIN I: Troponin I: <0.3 ng/mL (ref <0.30)

## 2013-07-24 MED ORDER — ATENOLOL 25 MG PO TABS: 25.0000 mg | ORAL_TABLET | Freq: Every day | ORAL | Status: DC

## 2013-07-24 MED ORDER — SIMVASTATIN 20 MG PO TABS: 40.0000 mg | ORAL_TABLET | Freq: Every evening | ORAL | Status: DC

## 2013-07-24 MED ORDER — ONDANSETRON HCL 4 MG/2ML IJ SOLN: 4.0000 mg | Freq: Four times a day (QID) | INTRAMUSCULAR | Status: DC | PRN

## 2013-07-24 MED ORDER — ACETAMINOPHEN 650 MG RE SUPP: 650.0000 mg | Freq: Four times a day (QID) | RECTAL | Status: DC | PRN

## 2013-07-24 MED ORDER — ALPRAZOLAM 0.25 MG PO TABS: 0.2500 mg | ORAL_TABLET | Freq: Two times a day (BID) | ORAL | Status: DC | PRN

## 2013-07-24 MED ORDER — ENOXAPARIN SODIUM 40 MG/0.4ML ~~LOC~~ SOLN: 40.0000 mg | SUBCUTANEOUS | Status: DC

## 2013-07-24 MED ORDER — SODIUM CHLORIDE 0.9 % IJ SOLN
3.0000 mL | Freq: Two times a day (BID) | INTRAMUSCULAR | Status: DC
  Administered 2013-07-24: 3 mL via INTRAVENOUS

## 2013-07-24 MED ORDER — ENOXAPARIN SODIUM 30 MG/0.3ML ~~LOC~~ SOLN
30.0000 mg | SUBCUTANEOUS | Status: DC
  Administered 2013-07-24: 30 mg via SUBCUTANEOUS
  Filled 2013-07-24: qty 0.3

## 2013-07-24 MED ORDER — ATENOLOL 50 MG PO TABS: 25.0000 mg | ORAL_TABLET | Freq: Every day | ORAL | Status: DC

## 2013-07-24 MED ORDER — SODIUM CHLORIDE 0.9 % IJ SOLN: 3.0000 mL | Freq: Two times a day (BID) | INTRAMUSCULAR | Status: DC

## 2013-07-24 MED ORDER — ASPIRIN EC 81 MG PO TBEC
81.0000 mg | DELAYED_RELEASE_TABLET | Freq: Every day | ORAL | Status: DC
  Administered 2013-07-24: 81 mg via ORAL
  Filled 2013-07-24: qty 1

## 2013-07-24 MED ORDER — ATENOLOL 25 MG PO TABS
50.0000 mg | ORAL_TABLET | Freq: Two times a day (BID) | ORAL | Status: DC
  Administered 2013-07-24: 50 mg via ORAL
  Filled 2013-07-24: qty 2

## 2013-07-24 MED ORDER — SODIUM CHLORIDE 0.9 % IV SOLN: 250.0000 mL | INTRAVENOUS | Status: DC | PRN

## 2013-07-24 MED ORDER — PANTOPRAZOLE SODIUM 40 MG PO TBEC
40.0000 mg | DELAYED_RELEASE_TABLET | Freq: Two times a day (BID) | ORAL | Status: DC
  Administered 2013-07-24: 40 mg via ORAL
  Filled 2013-07-24: qty 1

## 2013-07-24 MED ORDER — ACETAMINOPHEN 325 MG PO TABS: 650.0000 mg | ORAL_TABLET | Freq: Four times a day (QID) | ORAL | Status: DC | PRN

## 2013-07-24 MED ORDER — SODIUM CHLORIDE 0.9 % IJ SOLN: 3.0000 mL | INTRAMUSCULAR | Status: DC | PRN

## 2013-07-24 MED ORDER — ONDANSETRON HCL 4 MG PO TABS: 4.0000 mg | ORAL_TABLET | Freq: Four times a day (QID) | ORAL | Status: DC | PRN

## 2013-07-24 NOTE — Progress Notes (Signed)
Utilization Review Complete  

## 2013-07-24 NOTE — Discharge Summary (Signed)
Physician Discharge Summary  Norma Barajas ZOX:[id number] DOB: 06-Sep-1941 DOA: 07/23/2013  PCP: Rudi Heap, MD  Admit date: 07/23/2013 Discharge date: 07/24/2013  Time spent: 35 minutes  Recommendations for Outpatient Follow-up:  1. Patient will follow up with cardiology as an outpatient to pursue stress test. 2. Follow up with primary care doctor in 1-2 weeks  Discharge Diagnoses:  Principal Problem:   Chest pain Active Problems:   HYPERLIPIDEMIA   ANXIETY   PAF (paroxysmal atrial fibrillation)   HTN (hypertension)   Hyperglycemia   Coronary atherosclerosis of native coronary artery   Discharge Condition: improved  Diet recommendation: low salt  Filed Weights   07/24/13 0409  Weight: 56.4 kg (124 lb 5.4 oz)    History of present illness:  Norma Barajas is a 72 y.o. female with a past medical history of paroxysmal atrial fibrillation, hypertension, anxiety, who was in her usual state of health yesterday evening when she started experiencing some palpitations and chest pain while she was eating her meal. She also had a headache. She checked her blood pressure and it was 189/90. Her chest pain, worsened to become 8/10 in intensity. It was a pressure-like sensation across her chest without any radiation. She had nausea and a few episodes of vomiting. She had diaphoresis as well. She had dizziness and some shortness of breath as well. She took atenolol and a dose of Xanax with no relief. Since her symptoms were not improving she called 911. She was given aspirin and she was given one dose of nitroglycerin in the ED, with which the pain started resolving. Currently, she just has a little bit of pressure in the chest. But feels significantly better.   Hospital Course:  Patient was admitted to the hospital for chest pain and palpitations. She has a history of paroxysmal atrial for as well as coronary artery disease. The patient seen by cardiology.  Since her admission, her symptoms have  resolved. Outpatient stress test was recommended. She was also recommended to take her beta blocker consistently on a daily basis. She was taking it sporadically prior to admission. The patient feels improved and is ready for discharge home today. Cardiac enzymes were found to be negative.   Procedures:  none  Consultations:  Cardiology  Discharge Exam: Filed Vitals:   07/24/13 0409  BP: 136/76  Pulse: 63  Temp: 98.1 F (36.7 C)  Resp: 16    General: NAD Cardiovascular: S1, S2 RRR Respiratory: CTA B  Discharge Instructions  Discharge Orders   Future Appointments Provider Department Dept Phone   07/30/2013 11:45 AM [facility]  CARDIAC REHAB[facility]ATION [phone]   08/07/2013 1:30 PM Jodelle Gross, NP [facility] [phone]   09/13/2013 12:00 PM Ileana Ladd, MD [facility] [phone]   Future Orders Complete By Expires   Call MD for:  difficulty breathing, headache or visual disturbances  As directed    Call MD for:  severe uncontrolled pain  As directed    Diet - low sodium heart healthy  As directed    Increase activity slowly  As directed        Medication List         ALPRAZolam 0.25 MG tablet  Commonly known as:  XANAX  Take 1 tablet (0.25 mg total) by mouth 2 (two) times daily as needed for sleep or anxiety. For anxiety/nerves     aspirin EC 81 MG tablet  Take 81 mg by mouth daily.  atenolol 50 MG tablet  Commonly known as:  TENORMIN  Take 0.5 tablets (25 mg total) by mouth daily.     calcium citrate-vitamin D 200-200 MG-UNIT Tabs  Take 1 tablet by mouth every morning.     multivitamin capsule  Take 1 capsule by mouth every morning.     omeprazole 20 MG capsule  Commonly known as:  PRILOSEC  Take 1 capsule (20 mg total) by mouth 2 (two) times daily.     simvastatin 40 MG tablet  Commonly known as:  ZOCOR  Take 1 tablet (40 mg total) by mouth every evening.       Allergies   Allergen Reactions  . Crestor [Rosuvastatin Calcium] Other (See Comments)    weakness  . Iodine     ivp dye  . Iohexol   . Morphine   . Risedronate Sodium Other (See Comments)    ACTONEL REACTION: reflux  . Triamterene-Hctz Other (See Comments)    weakness       Follow-up Information   Follow up with [facility] On 07/30/2013. (at 9:15 am   No food or drink after midnight the night before)    Specialty:  Rehabilitation   Contact information:   [address] [id number] [place] [address] [phone]      Follow up with Joni Reining, NP On 08/07/2013. (at 1:30 pm)    Specialty:  Nurse Practitioner   Contact information:   [address] [place] [address] [phone]        The results of significant diagnostics from this hospitalization (including imaging, microbiology, ancillary and laboratory) are listed below for reference.    Significant Diagnostic Studies: Dg Chest Port 1 View  07/23/2013   CLINICAL DATA:  Chest pain and nausea  EXAM: PORTABLE CHEST - 1 VIEW  COMPARISON:  11/03/2012  FINDINGS: The cardiac silhouette is normal in size and configuration. The mediastinum is normal in contour. There is mild prominence of the right hilum that appears vascular. It is stable.  There are prominent bronchovascular markings, but no overt pulmonary edema. Mild scarring or subsegmental atelectasis is noted in the left lung base. No evidence of an infiltrate.  No pleural effusion or pneumothorax.  The bony thorax is demineralized but grossly intact.  IMPRESSION: No acute cardiopulmonary disease.   Electronically Signed   By: Amie Portland   On: 07/23/2013 20:21    Microbiology: No results found for this or any previous visit (from the past 240 hour(s)).   Labs: Basic Metabolic Panel:  Recent Labs Lab 07/23/13 2001 07/24/13 0301 07/24/13 0805  NA 137  --  138  K 3.0*  --  3.8  CL 96  --  103  CO2 26  --  28  GLUCOSE 199*  --   108*  BUN 11  --  9  CREATININE 0.71  --  0.69  CALCIUM 9.5  --  9.3  MG  --  2.0  --    Liver Function Tests: No results found for this basename: AST, ALT, ALKPHOS, BILITOT, PROT, ALBUMIN,  in the last 168 hours No results found for this basename: LIPASE, AMYLASE,  in the last 168 hours No results found for this basename: AMMONIA,  in the last 168 hours CBC:  Recent Labs Lab 07/23/13 2001  WBC 7.8  NEUTROABS 6.0  HGB 13.3  HCT 40.6  MCV 80.6  PLT 211   Cardiac Enzymes:  Recent Labs Lab 07/23/13 2001 07/24/13 0050 07/24/13  0805  TROPONINI <0.30 <0.30 <0.30   BNP: BNP (last 3 results) No results found for this basename: PROBNP,  in the last 8760 hours CBG: No results found for this basename: GLUCAP,  in the last 168 hours     Signed:  Margeaux Swantek  [facility] 07/24/2013, 2:04 PM

## 2013-07-24 NOTE — Consult Note (Signed)
Primary cardiologist: Dr. Charlton Haws Consulting cardiologist: Dr. Jonelle Sidle  Clinical Summary Norma Barajas is a 72 y.o.female admitted to the hospital with chest pain. She reports a dull, burning sensation in her mid lower sternal area that started yesterday evening after eating dinner. She has had intermittent symptoms similar to this over time, this one was more intense and prolonged. No recent palpitations or breathlessness.  She recently saw Dr. Eden Emms for office followup in June. At that time she was noted to be in sinus rhythm, amiodarone for PAF had been discontinued due to intolerances. She was also taken off of atenolol. Also reporting intermittent chest pain at that time related to stress. Declined Myoview study.  We reviewed her medications. She tells me that she still takes her atenolol inconsistently, bases this on her blood pressure which she checks at home. Actually, she states that she usually takes only one half of atenolol 50 mg dose at most once a day, typically less than this. She may be taking another antihypertensive, although this is not clearly documented in the chart.  Records reviewed. She has had no ischemic testing since last cardiac catheterization.   Allergies  Allergen Reactions  . Crestor [Rosuvastatin Calcium] Other (See Comments)    weakness  . Iodine     ivp dye  . Iohexol   . Morphine   . Risedronate Sodium Other (See Comments)    ACTONEL REACTION: reflux  . Triamterene-Hctz Other (See Comments)    weakness    Medications Scheduled Medications: . aspirin EC  81 mg Oral Daily  . atenolol  50 mg Oral BID AC  . enoxaparin (LOVENOX) injection  30 mg Subcutaneous Q24H  . pantoprazole  40 mg Oral BID AC  . simvastatin  40 mg Oral QPM  . sodium chloride  3 mL Intravenous Q12H  . sodium chloride  3 mL Intravenous Q12H     PRN Medications: sodium chloride, acetaminophen, acetaminophen, ALPRAZolam, ondansetron (ZOFRAN) IV, ondansetron,  sodium chloride   Past Medical History  Diagnosis Date  . Essential hypertension, benign   . Mitral regurgitation     Mild 09/2012  . Erosive esophagitis   . GERD (gastroesophageal reflux disease)   . Hypercholesterolemia   . Coronary atherosclerosis of native coronary artery     Nonobstructive CAD 10/2005  . Paroxysmal atrial fibrillation   . Diastolic dysfunction     Grade 1. Ejection fraction 60-65%.    Past Surgical History  Procedure Laterality Date  . Abdominal hysterectomy    . Prolapsed uterine fibroid ligation    . Cataract extraction    . Steel plate in the leg      broke tibia and fibula after falling down stairs  . Esophagogastroduodenoscopy  2001    Dr. Arlyce Dice: erosive esophagitis, esophageal stricture, duodenitis, s/p Savary dilation  . Colonoscopy  2008    Dr. Darrick Penna: internal hemorrhoids   . Esophagogastroduodenoscopy (egd) with esophageal dilation  11/15/2012    [id number] web was found & MOST LIKELY CAUSE FOR DYAPHAGIA/Polyp was found in the gastric body and gastric fundus  . Bravo ph study  11/15/2012    Procedure: BRAVO PH STUDY;  Surgeon: West Bali, MD;  [facility]: [facility];  Service: Endoscopy;;    Family History  Problem Relation Age of Onset  . Colon cancer Mother     Diagnosed at age 63    Social History Ms. Christian reports that she has never smoked. She does not have any smokeless  tobacco history on file. Ms. Kaelin reports that she does not drink alcohol.  Review of Systems  Physical Examination Blood pressure 136/76, pulse 63, temperature 98.1 F (36.7 C), temperature source Oral, resp. rate 16, height 5\' 2"  (1.575 m), weight 124 lb 5.4 oz (56.4 kg), SpO2 98.00%. No intake or output data in the 24 hours ending 07/24/13 1610  Telemetry: Sinus rhythm, no atrial fibrillation.  Appears comfortable at rest. HEENT: Conjunctiva and lids normal, oropharynx clear with moist mucosa. Neck: Supple, no elevated JVP or carotid bruits, no  thyromegaly. Lungs: Clear to auscultation, nonlabored breathing at rest. Cardiac: Regular rate and rhythm, no S3 or significant systolic murmur, no pericardial rub. Abdomen: Soft, nontender, bowel sounds present, no guarding or rebound. Extremities: No pitting edema, distal pulses 2+. Skin: Warm and dry. Musculoskeletal: No kyphosis. Neuropsychiatric: Alert and oriented x3, affect grossly appropriate.   Lab Results  Basic Metabolic Panel:  Recent Labs Lab 07/23/13 2001 07/24/13 0301 07/24/13 0805  NA 137  --  138  K 3.0*  --  3.8  CL 96  --  103  CO2 26  --  28  GLUCOSE 199*  --  108*  BUN 11  --  9  CREATININE 0.71  --  0.69  CALCIUM 9.5  --  9.3  MG  --  2.0  --     CBC:  Recent Labs Lab 07/23/13 2001  WBC 7.8  NEUTROABS 6.0  HGB 13.3  HCT 40.6  MCV 80.6  PLT 211    Cardiac Enzymes:  Recent Labs Lab 07/23/13 2001 07/24/13 0050 07/24/13 0805  TROPONINI <0.30 <0.30 <0.30    ECG Sinus rhythm with PAC, nonspecific ST changes.  Imaging PORTABLE CHEST - 1 VIEW  COMPARISON: 11/03/2012  FINDINGS: The cardiac silhouette is normal in size and configuration. The mediastinum is normal in contour. There is mild prominence of the right hilum that appears vascular. It is stable.  There are prominent bronchovascular markings, but no overt pulmonary edema. Mild scarring or subsegmental atelectasis is noted in the left lung base. No evidence of an infiltrate.  No pleural effusion or pneumothorax.  The bony thorax is demineralized but grossly intact.  IMPRESSION: No acute cardiopulmonary disease.   Impression  1. Atypical chest pain. ECG shows nonspecific ST segment changes, cardiac markers are normal. Could be gastrointestinal in etiology based on description. She reports concurrent dysphasia, has a history of erosive esophagitis and prior stricture dilatation. She also has history of nonobstructive CAD, no followup ischemic testing since 2006.  2.  CAD, nonobstructive at catheterization in 2006.  3. Hyperlipidemia, on Zocor.  4. History of paroxysmal atrial fibrillation, currently in sinus rhythm. She did not tolerate amiodarone. Has been on aspirin as per Dr. Eden Emms.  5. Hypertension.   Recommendations  Patient states she feels better today. We discussed options for further evaluation. From a cardiac perspective, will arrange a followup Lexiscan Myoview as an outpatient for repeat objective ischemic assessment. She is in agreement to proceed with this. Office followup will be arranged to review. In addition, would recommend trying more consistent use of low-dose atenolol rather than taking it as she is currently doing. Perhaps atenolol 12.5 or 25 mg daily would be better tolerated.  Jonelle Sidle, M.D., F.A.C.C.

## 2013-07-24 NOTE — H&P (Signed)
[facility] History and Physical  Norma Barajas WJX:[id number] DOB: 04-23-1941 DOA: 07/23/2013   PCP: Rudi Heap, MD  Specialists: Dr. Eden Emms is her cardiologist  Chief Complaint:  Chest pain  HPI: Norma Barajas is a 72 y.o. female with a past medical history of paroxysmal atrial fibrillation, hypertension, anxiety, who was in her usual state of health yesterday evening when she started experiencing some palpitations and chest pain while she was eating her meal. She also had a headache. She checked her blood pressure and it was 189/90. Her chest pain, worsened to become 8/10 in intensity. It was a pressure-like sensation across her chest without any radiation. She had nausea and a few episodes of vomiting. She had diaphoresis as well. She had dizziness and some shortness of breath as well. She took atenolol and a dose of Xanax with no relief. Since her symptoms were not improving she called 911. She was given aspirin and she was given one dose of nitroglycerin in the ED, with which the pain started resolving. Currently, she just has a little bit of pressure in the chest. But feels significantly better.  Home Medications: Prior to Admission medications   Medication Sig Start Date End Date Taking? Authorizing Provider  ALPRAZolam (XANAX) 0.25 MG tablet Take 1 tablet (0.25 mg total) by mouth 2 (two) times daily as needed for sleep or anxiety. For anxiety/nerves 05/14/13  Yes Ileana Ladd, MD  aspirin EC 81 MG tablet Take 81 mg by mouth daily.   Yes Historical Provider, MD  atenolol (TENORMIN) 50 MG tablet Take 1 tablet (50 mg total) by mouth 2 (two) times daily before a meal. 05/14/13  Yes Ileana Ladd, MD  calcium citrate-vitamin D 200-200 MG-UNIT TABS Take 1 tablet by mouth every morning.    Yes Historical Provider, MD  Multiple Vitamin (MULTIVITAMIN) capsule Take 1 capsule by mouth every morning.    Yes Historical Provider, MD  omeprazole (PRILOSEC) 20 MG capsule Take 1 capsule (20 mg total)  by mouth 2 (two) times daily. 05/14/13  Yes Ileana Ladd, MD  simvastatin (ZOCOR) 40 MG tablet Take 1 tablet (40 mg total) by mouth every evening. 05/14/13  Yes Ileana Ladd, MD    Allergies:  Allergies  Allergen Reactions  . Crestor [Rosuvastatin Calcium] Other (See Comments)    weakness  . Iodine     ivp dye  . Iohexol   . Morphine   . Risedronate Sodium Other (See Comments)    ACTONEL REACTION: reflux  . Triamterene-Hctz Other (See Comments)    weakness    Past Medical History: Past Medical History  Diagnosis Date  . Hypertension   . Mitral regurgitation     Mild 2011 with moderate LAD  . Erosive esophagitis   . GERD (gastroesophageal reflux disease)   . Hypercholesterolemia   . History of cardiac catheterization December 2006    Nonobstructive CAD  . Atrial fibrillation with RVR 09/28/2012    Appears to be paroxysmal. Sinus brady at D/C.  Marland Kitchen Diastolic dysfunction 09/29/2012.    Grade 1. Ejection fraction 60-65%.    Past Surgical History  Procedure Laterality Date  . Abdominal hysterectomy    . Prolapsed uterine fibroid ligation    . Cataract extraction    . Steel plate in the leg      broke tibia and fibula after falling down stairs  . Esophagogastroduodenoscopy  2001    Dr. Arlyce Dice: erosive esophagitis, esophageal stricture, duodenitis, s/p Savary dilation  .  Colonoscopy  2008    Dr. Darrick Penna: internal hemorrhoids   . Esophagogastroduodenoscopy (egd) with esophageal dilation  11/15/2012    [id number] web was found & MOST LIKELY CAUSE FOR DYAPHAGIA/Polyp was found in the gastric body and gastric fundus  . Bravo ph study  11/15/2012    Procedure: BRAVO PH STUDY;  Surgeon: West Bali, MD;  Location: [facility];  Service: Endoscopy;;    Social History:  reports that she has never smoked. She does not have any smokeless tobacco history on file. She reports that she does not drink alcohol or use illicit drugs.  Living Situation: She lives with her  husband Activity Level: Independent with daily activities   Family History:  Family History  Problem Relation Age of Onset  . Colon cancer Mother     diagnosed at age 49     Review of Systems - History obtained from the patient General ROS: positive for  - fatigue Psychological ROS: positive for - anxiety Ophthalmic ROS: negative ENT ROS: negative Allergy and Immunology ROS: negative Hematological and Lymphatic ROS: negative Endocrine ROS: negative Respiratory ROS: as in hpi Cardiovascular ROS: as in hpi Gastrointestinal ROS: no abdominal pain, change in bowel habits, or black or bloody stools Genito-Urinary ROS: no dysuria, trouble voiding, or hematuria Musculoskeletal ROS: negative Neurological ROS: no TIA or stroke symptoms Dermatological ROS: negative  Physical Examination  Filed Vitals:   07/23/13 1948 07/23/13 2034 07/23/13 2206 07/23/13 2352  BP:  165/92 135/77 142/77  Pulse:  86 69 73  Temp:      TempSrc:      Resp:  18 18 16   SpO2: 94% 98% 99% 98%    General appearance: alert, cooperative, appears stated age and no distress Head: Normocephalic, without obvious abnormality, atraumatic Eyes: conjunctivae/corneas clear. PERRL, EOM's intact. Throat: lips, mucosa, and tongue normal; teeth and gums normal Neck: no adenopathy, no carotid bruit, no JVD, supple, symmetrical, trachea midline and thyroid not enlarged, symmetric, no tenderness/mass/nodules Resp: clear to auscultation bilaterally Chest wall: no tenderness Cardio: S2 is normal. Regular. Systolic murmur appreciated over the precordium. No S3, S4. No rubs, or bruits. No pedal edema. No JVD. GI: soft, non-tender; bowel sounds normal; no masses,  no organomegaly Extremities: extremities normal, atraumatic, no cyanosis or edema Pulses: 2+ and symmetric Skin: Skin color, texture, turgor normal. No rashes or lesions Neurologic: She is alert and oriented x3. No focal neurological deficits are  present.  Laboratory Data: Results for orders placed during the hospital encounter of 07/23/13 (from the past 48 hour(s))  BASIC METABOLIC PANEL     Status: Abnormal   Collection Time    07/23/13  8:01 PM      Result Value Range   Sodium 137  135 - 145 mEq/L   Potassium 3.0 (*) 3.5 - 5.1 mEq/L   Chloride 96  96 - 112 mEq/L   CO2 26  19 - 32 mEq/L   Glucose, Bld 199 (*) 70 - 99 mg/dL   BUN 11  6 - 23 mg/dL   Creatinine, Ser 1.19  0.50 - 1.10 mg/dL   Calcium 9.5  8.4 - 14.7 mg/dL   GFR calc non Af Amer 83 (*) >90 mL/min   GFR calc Af Amer >90  >90 mL/min   Comment: (NOTE)     The eGFR has been calculated using the CKD EPI equation.     This calculation has not been validated in all clinical situations.     eGFR's persistently <  90 mL/min signify possible Chronic Kidney     Disease.  CBC WITH DIFFERENTIAL     Status: Abnormal   Collection Time    07/23/13  8:01 PM      Result Value Range   WBC 7.8  4.0 - 10.5 K/uL   RBC 5.04  3.87 - 5.11 MIL/uL   Hemoglobin 13.3  12.0 - 15.0 g/dL   HCT 19.1  47.8 - 29.5 %   MCV 80.6  78.0 - 100.0 fL   MCH 26.4  26.0 - 34.0 pg   MCHC 32.8  30.0 - 36.0 g/dL   RDW 62.1 (*) 30.8 - 65.7 %   Platelets 211  150 - 400 K/uL   Neutrophils Relative % 76  43 - 77 %   Neutro Abs 6.0  1.7 - 7.7 K/uL   Lymphocytes Relative 17  12 - 46 %   Lymphs Abs 1.3  0.7 - 4.0 K/uL   Monocytes Relative 6  3 - 12 %   Monocytes Absolute 0.5  0.1 - 1.0 K/uL   Eosinophils Relative 0  0 - 5 %   Eosinophils Absolute 0.0  0.0 - 0.7 K/uL   Basophils Relative 1  0 - 1 %   Basophils Absolute 0.0  0.0 - 0.1 K/uL  TROPONIN I     Status: None   Collection Time    07/23/13  8:01 PM      Result Value Range   Troponin I <0.30  <0.30 ng/mL   Comment:            Due to the release kinetics of cTnI,     a negative result within the first hours     of the onset of symptoms does not rule out     myocardial infarction with certainty.     If myocardial infarction is still  suspected,     repeat the test at appropriate intervals.  TROPONIN I     Status: None   Collection Time    07/24/13 12:50 AM      Result Value Range   Troponin I <0.30  <0.30 ng/mL   Comment:            Due to the release kinetics of cTnI,     a negative result within the first hours     of the onset of symptoms does not rule out     myocardial infarction with certainty.     If myocardial infarction is still suspected,     repeat the test at appropriate intervals.    Radiology Reports: Dg Chest Port 1 View  07/23/2013   CLINICAL DATA:  Chest pain and nausea  EXAM: PORTABLE CHEST - 1 VIEW  COMPARISON:  11/03/2012  FINDINGS: The cardiac silhouette is normal in size and configuration. The mediastinum is normal in contour. There is mild prominence of the right hilum that appears vascular. It is stable.  There are prominent bronchovascular markings, but no overt pulmonary edema. Mild scarring or subsegmental atelectasis is noted in the left lung base. No evidence of an infiltrate.  No pleural effusion or pneumothorax.  The bony thorax is demineralized but grossly intact.  IMPRESSION: No acute cardiopulmonary disease.   Electronically Signed   By: Amie Portland   On: 07/23/2013 20:21    Electrocardiogram: EKG shows sinus rhythm with PACs at 82 beats per minute. Normal axis. Prolonged QT is noted. No acute ST or T-wave changes. No Q waves. Similar to previous  EKG.  Problem List  Principal Problem:   Chest pain Active Problems:   HYPERLIPIDEMIA   ANXIETY   PAF (paroxysmal atrial fibrillation)   HTN (hypertension)   Assessment: This is a 72 year old, Caucasian female who presents with chest pain, palpitations, and diaphoresis. This happened while she was eating and afterwards. Differential diagnoses include angina, esophageal spasm. Venous thromboembolism is less likely. She does have a history of GERD and is on proton pump inhibitors.  Plan: #1 chest pain at rest: EKG is nonischemic.  She'll be observed in the hospital. Cardiology will be consulted. EKG will be repeated. Continue with aspirin for now. Blood pressure control. Cycle troponin.  #2 history of paroxysmal atrial fibrillation: Currently in sinus rhythm. She's not on anticoagulation and just on aspirin.  #3 history of hypertension: Monitor blood pressure closely. Continue with atenolol.  #4 history of hyperlipidemia: Continue with statin medication.  #5 history of anxiety disorder with panic attacks: Continue with Xanax as needed.  Potassium has been repleted by the ED.   She has hyperglycemia, but this is a nonfasting level. A fasting level will be checked. HbA1c will be checked.  DVT Prophylaxis: Lovenox Code Status: Full code Family Communication: Discussed with the patient and her neice Disposition Plan:  Observe on telemetry   Further management decisions will depend on results of further testing and patient's response to treatment.  [facility]  [facility] Pager [phone]  If 7PM-7AM, please contact night-coverage [email or web address] Password [facility]  07/24/2013, 2:46 AM

## 2013-07-24 NOTE — Progress Notes (Signed)
D/c instructions reviewed with patient and husband.  Verbalized understanding. Pt dc'd to home with husband. Schonewitz, Candelaria Stagers 07/24/2013

## 2013-07-24 NOTE — ED Provider Notes (Signed)
Patient is a seen and evaluated by Dr. Adriana Simas. She presented with chest pain and continues to have some mild chest pain in the ED. Initial ECG and troponin were unremarkable and case had been discussed with cardiology at [facility] who recommended a second troponin at admission at any panel hospital is negative. Second troponin has come back negative. Case is discussed with Dr. Rito Ehrlich. At this point, I am concerned because of ongoing chest pain that she still might be better managed at [facility]. Dr. Rito Ehrlich has agreed to come to see the patient in the ED.  Dione Booze, MD 07/24/13 [phone]

## 2013-07-25 ENCOUNTER — Other Ambulatory Visit: Payer: Self-pay | Admitting: *Deleted

## 2013-07-25 DIAGNOSIS — R079 Chest pain, unspecified: Secondary | ICD-10-CM

## 2013-07-30 ENCOUNTER — Encounter (HOSPITAL_COMMUNITY): Payer: Medicare PPO

## 2013-07-30 ENCOUNTER — Other Ambulatory Visit (HOSPITAL_COMMUNITY): Payer: Medicare PPO

## 2013-08-01 ENCOUNTER — Telehealth: Payer: Self-pay | Admitting: *Deleted

## 2013-08-01 ENCOUNTER — Ambulatory Visit (INDEPENDENT_AMBULATORY_CARE_PROVIDER_SITE_OTHER): Payer: Medicare HMO | Admitting: General Practice

## 2013-08-01 ENCOUNTER — Encounter: Payer: Self-pay | Admitting: General Practice

## 2013-08-01 VITALS — BP 145/80 | HR 61 | Temp 97.4°F | Ht 62.0 in | Wt 125.0 lb

## 2013-08-01 DIAGNOSIS — R030 Elevated blood-pressure reading, without diagnosis of hypertension: Secondary | ICD-10-CM

## 2013-08-01 NOTE — Telephone Encounter (Signed)
appt scheduled

## 2013-08-01 NOTE — Progress Notes (Signed)
  Subjective:    Patient ID: Norma Barajas, female    DOB: 1941/01/31, 72 y.o.   MRN: [id number]  HPI Patient presents today with complaints of elevated blood pressure that occurred lastnight. Reports blood pressure wouldn't register on home blood pressure machine. Reports feeling dizzy at that time, but no chest pain. She reports taking atenolol, xanax, nitroglycerin (having not chest pain at that time), and tylenol. Reports rechecking at 2am, with reading of 195/90, but she is unsure.  She reports checking blood pressure this am 169/83, pulse 60 at 8am. She denies dizziness, chest pain at this time.  Dorette reports being scheduled for her stress test on Monday 07/30/13, but cancel due to anxiety. She and her husband verbalized they will reschedule it today once they get home.    Review of Systems  Constitutional: Negative for fever and chills.  Respiratory: Negative for chest tightness and shortness of breath.   Cardiovascular: Negative for chest pain and palpitations.  Gastrointestinal: Negative for nausea and vomiting.  Neurological: Negative for dizziness, weakness and headaches.       Objective:   Physical Exam  Constitutional: She is oriented to person, place, and time. She appears well-developed and well-nourished.  HENT:  Head: Normocephalic and atraumatic.  Cardiovascular: Normal rate, regular rhythm and normal heart sounds.   Pulmonary/Chest: Effort normal and breath sounds normal. No respiratory distress. She exhibits no tenderness.  Abdominal: Soft. Bowel sounds are normal. She exhibits no distension. There is no tenderness.  Neurological: She is alert and oriented to person, place, and time.  Skin: Skin is warm and dry.  Psychiatric: She has a normal mood and affect.          Assessment & Plan:  1. Elevated blood pressure -Blood pressure during visit back in patient's normal range -discussed anxiety  -discussed taking medications as prescribed -discussed importance of  maintaining appointment for stress test -Patient verbalized understanding -Coralie Keens, FNP-C

## 2013-08-01 NOTE — Telephone Encounter (Signed)
Please advise 

## 2013-08-01 NOTE — Telephone Encounter (Signed)
Spoke to pt to advise results/instructions. Pt understood.  

## 2013-08-01 NOTE — Patient Instructions (Signed)

## 2013-08-01 NOTE — Telephone Encounter (Signed)
Yes she can take xanax

## 2013-08-01 NOTE — Telephone Encounter (Signed)
PT IS SCHEDULED TO HAVE STRESS TEST 08/07/13 AND WANT TO KNOW IF SHE CAN TAKE A XANAX BEFORE HAND. SHE HAS CANCELED THIS IN PAST BECAUSE SHE IS SO NERVOUS ABOUT IT.

## 2013-08-06 ENCOUNTER — Encounter: Payer: Self-pay | Admitting: Gastroenterology

## 2013-08-07 ENCOUNTER — Ambulatory Visit (HOSPITAL_COMMUNITY)
Admission: RE | Admit: 2013-08-07 | Discharge: 2013-08-07 | Disposition: A | Discharge status: Home / Self Care | Payer: Medicare PPO | Source: Ambulatory Visit | Attending: Cardiology | Admitting: Cardiology

## 2013-08-07 ENCOUNTER — Encounter (HOSPITAL_COMMUNITY)
Admission: RE | Admit: 2013-08-07 | Discharge: 2013-08-07 | Disposition: A | Discharge status: Home / Self Care | Payer: Medicare PPO | Source: Ambulatory Visit | Attending: Cardiology | Admitting: Cardiology

## 2013-08-07 ENCOUNTER — Encounter: Payer: Medicare PPO | Admitting: Adult Health

## 2013-08-07 ENCOUNTER — Encounter (HOSPITAL_COMMUNITY): Payer: Self-pay

## 2013-08-07 DIAGNOSIS — R079 Chest pain, unspecified: Secondary | ICD-10-CM

## 2013-08-07 DIAGNOSIS — I2510 Atherosclerotic heart disease of native coronary artery without angina pectoris: Secondary | ICD-10-CM

## 2013-08-07 DIAGNOSIS — I4891 Unspecified atrial fibrillation: Secondary | ICD-10-CM | POA: Insufficient documentation

## 2013-08-07 MED ORDER — REGADENOSON 0.4 MG/5ML IV SOLN
INTRAVENOUS | Status: AC
  Administered 2013-08-07: 0.4 mg via INTRAVENOUS
  Filled 2013-08-07: qty 5

## 2013-08-07 MED ORDER — TECHNETIUM TC 99M SESTAMIBI - CARDIOLITE
10.0000 | Freq: Once | INTRAVENOUS | Status: AC | PRN
  Administered 2013-08-07: 09:00:00 10 via INTRAVENOUS

## 2013-08-07 MED ORDER — TECHNETIUM TC 99M SESTAMIBI GENERIC - CARDIOLITE
30.0000 | Freq: Once | INTRAVENOUS | Status: AC | PRN
  Administered 2013-08-07: 30 via INTRAVENOUS

## 2013-08-07 MED ORDER — SODIUM CHLORIDE 0.9 % IJ SOLN
INTRAMUSCULAR | Status: AC
  Administered 2013-08-07: 10 mL via INTRAVENOUS
  Filled 2013-08-07: qty 10

## 2013-08-07 NOTE — Progress Notes (Signed)
Stress Lab Nurses Notes - Norma Barajas  Norma Barajas 08/07/2013 Reason for doing test: CAD, Chest Pain and Afib Type of test: Marlane Hatcher Nurse performing test: Parke Poisson, RN Nuclear Medicine Tech: Lou Cal Echo Tech: Not Applicable MD performing test: Dr. Wyline Mood / Joni Reining NP Family MD: Dr. Christell Constant Test explained and consent signed: yes IV started: 22g jelco, Saline lock flushed, No redness or edema and Saline lock started in radiology Symptoms: Dizziness & headach Treatment/Intervention: None Reason test stopped: protocol completed After recovery IV was: Discontinued via X-ray tech and No redness or edema Patient to return to Nuc. Med at : 12:00 Patient discharged: Home Patient's Condition upon discharge was: stable Comments: During test BP 171/91 & HR 112.  Recovery BP 151/85 & HR 87.  Symptoms resolved in recovery. [name] T

## 2013-08-20 ENCOUNTER — Ambulatory Visit (INDEPENDENT_AMBULATORY_CARE_PROVIDER_SITE_OTHER): Payer: Medicare HMO

## 2013-08-20 ENCOUNTER — Ambulatory Visit: Payer: Medicare HMO

## 2013-08-20 DIAGNOSIS — Z23 Encounter for immunization: Secondary | ICD-10-CM

## 2013-09-05 ENCOUNTER — Ambulatory Visit: Payer: Medicare HMO

## 2013-09-13 ENCOUNTER — Ambulatory Visit (INDEPENDENT_AMBULATORY_CARE_PROVIDER_SITE_OTHER): Payer: Medicare HMO | Admitting: Family Medicine

## 2013-09-13 ENCOUNTER — Encounter: Payer: Self-pay | Admitting: Family Medicine

## 2013-09-13 VITALS — BP 130/76 | HR 62 | Temp 97.5°F | Ht 62.0 in | Wt 128.0 lb

## 2013-09-13 DIAGNOSIS — I2510 Atherosclerotic heart disease of native coronary artery without angina pectoris: Secondary | ICD-10-CM

## 2013-09-13 DIAGNOSIS — E785 Hyperlipidemia, unspecified: Secondary | ICD-10-CM

## 2013-09-13 DIAGNOSIS — R739 Hyperglycemia, unspecified: Secondary | ICD-10-CM

## 2013-09-13 DIAGNOSIS — Z9119 Patient's noncompliance with other medical treatment and regimen: Secondary | ICD-10-CM

## 2013-09-13 DIAGNOSIS — F411 Generalized anxiety disorder: Secondary | ICD-10-CM

## 2013-09-13 DIAGNOSIS — K219 Gastro-esophageal reflux disease without esophagitis: Secondary | ICD-10-CM

## 2013-09-13 DIAGNOSIS — I4891 Unspecified atrial fibrillation: Secondary | ICD-10-CM

## 2013-09-13 DIAGNOSIS — M810 Age-related osteoporosis without current pathological fracture: Secondary | ICD-10-CM

## 2013-09-13 DIAGNOSIS — I10 Essential (primary) hypertension: Secondary | ICD-10-CM

## 2013-09-13 DIAGNOSIS — R7309 Other abnormal glucose: Secondary | ICD-10-CM

## 2013-09-13 DIAGNOSIS — Z91199 Patient's noncompliance with other medical treatment and regimen due to unspecified reason: Secondary | ICD-10-CM | POA: Insufficient documentation

## 2013-09-13 NOTE — Progress Notes (Signed)
Patient ID: Norma Barajas, female   DOB: [birth year], 72 y.o.   MRN: [id number] SUBJECTIVE: CC: Chief Complaint  Patient presents with  . Follow-up    4 month follow up  states bp goes up and down mostly in evenings    HPI: Patient is here for follow up of hypertension/hld: denies Headache;deniesChest Pain;denies weakness;denies Shortness of Breath or Orthopnea;denies Visual changes;denies palpitations;denies cough;denies pedal edema;denies symptoms of TIA or stroke; Compliance with medications.takes the amlodipine on and off.poorly compliant with amlodipine. Also with the atenolol she was supposed to be on Twice a day and hasn't been denies Problems with medications. Then patient complains of wild  Swings to her BP. Past Medical History  Diagnosis Date  . Essential hypertension, benign   . Mitral regurgitation     Mild 09/2012  . Erosive esophagitis   . GERD (gastroesophageal reflux disease)   . Hypercholesterolemia   . Coronary atherosclerosis of native coronary artery     Nonobstructive CAD 10/2005  . Paroxysmal atrial fibrillation   . Diastolic dysfunction     Grade 1. Ejection fraction 60-65%.   Past Surgical History  Procedure Laterality Date  . Abdominal hysterectomy    . Prolapsed uterine fibroid ligation    . Cataract extraction    . Steel plate in the leg      broke tibia and fibula after falling down stairs  . Esophagogastroduodenoscopy  2001    Dr. Arlyce Dice: erosive esophagitis, esophageal stricture, duodenitis, s/p Savary dilation  . Colonoscopy  2008    Dr. Darrick Penna: internal hemorrhoids   . 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 Bravo ph study  11/15/2012    Procedure: BRAVO PH STUDY;  Surgeon: West Bali, MD;  Location: [facility];  Service: Endoscopy;;   History   Social History  . Marital Status: Married     Spouse Name: N/A    Number of Children: N/A  . Years of Education: N/A   Occupational History  . Retired     Press photographer   Social History Main Topics  . Smoking status: Never Smoker   . Smokeless tobacco: Not on file  . Alcohol Use: No  . Drug Use: No  . Sexual Activity: Not on file   Other Topics Concern  . Not on file   Social History Narrative   Married   No regular exercise   Family History  Problem Relation Age of Onset  . Colon cancer Mother     Diagnosed at age 27   Current Outpatient Prescriptions on File Prior to Visit  Medication Sig Dispense Refill  . ALPRAZolam (XANAX) 0.25 MG tablet Take 1 tablet (0.25 mg total) by mouth 2 (two) times daily as needed for sleep or anxiety. For anxiety/nerves  60 tablet  3  . aspirin EC 81 MG tablet Take 81 mg by mouth daily.      Marland Kitchen atenolol (TENORMIN) 50 MG tablet Take 0.5 tablets (25 mg total) by mouth daily.  30 tablet  3  . calcium citrate-vitamin D 200-200 MG-UNIT TABS Take 1 tablet by mouth every morning.       . Multiple Vitamin (MULTIVITAMIN) capsule Take 1 capsule by mouth every morning.       Marland Kitchen omeprazole (PRILOSEC) 20 MG capsule Take 1 capsule (20 mg total) by mouth 2 (two) times daily.  180 capsule  3  . simvastatin (ZOCOR) 40 MG tablet Take 1 tablet (40 mg total) by mouth every evening.  90 tablet  3   No current facility-administered medications on file prior to visit.   Allergies  Allergen Reactions  . Crestor [Rosuvastatin Calcium] Other (See Comments)    weakness  . Iodine     ivp dye  . Iohexol   . Morphine   . Risedronate Sodium Other (See Comments)    ACTONEL REACTION: reflux  . Triamterene-Hctz Other (See Comments)    weakness   Immunization History  Administered Date(s) Administered  . DTaP 08/27/2009  . Influenza Whole 08/04/2010  . Influenza,inj,Quad PF,36+ Mos 08/20/2013   Prior to Admission medications   Medication Sig Start Date End Date Taking? Authorizing Provider  ALPRAZolam (XANAX)  0.25 MG tablet Take 1 tablet (0.25 mg total) by mouth 2 (two) times daily as needed for sleep or anxiety. For anxiety/nerves 05/14/13  Yes Ileana Ladd, MD  amLODipine (NORVASC) 10 MG tablet Take 10 mg by mouth daily.   Yes Historical Provider, MD  aspirin EC 81 MG tablet Take 81 mg by mouth daily.   Yes Historical Provider, MD  atenolol (TENORMIN) 50 MG tablet Take 0.5 tablets (25 mg total) by mouth daily. 07/24/13  Yes Erick Blinks, MD  calcium citrate-vitamin D 200-200 MG-UNIT TABS Take 1 tablet by mouth every morning.    Yes Historical Provider, MD  Multiple Vitamin (MULTIVITAMIN) capsule Take 1 capsule by mouth every morning.    Yes Historical Provider, MD  omeprazole (PRILOSEC) 20 MG capsule Take 1 capsule (20 mg total) by mouth 2 (two) times daily. 05/14/13  Yes Ileana Ladd, MD  simvastatin (ZOCOR) 40 MG tablet Take 1 tablet (40 mg total) by mouth every evening. 05/14/13  Yes Ileana Ladd, MD     ROS: As above in the HPI. All other systems are stable or negative.  OBJECTIVE: APPEARANCE:  Patient in no acute distress.The patient appeared well nourished and normally developed. Acyanotic. Waist: VITAL SIGNS:BP 130/76  Pulse 62  Temp(Src) 97.5 F (36.4 C) (Oral)  Ht 5\' 2"  (1.575 m)  Wt 128 lb (58.06 kg)  BMI 23.41 kg/m2   SKIN: warm and  Dry without overt rashes, tattoos and scars  HEAD and Neck: without JVD, Head and scalp: normal Eyes:No scleral icterus. Fundi normal, eye movements normal. Ears: Auricle normal, canal normal, Tympanic membranes normal, insufflation normal. Nose: normal Throat: normal Neck & thyroid: normal  CHEST & LUNGS: Chest wall: normal Lungs: Clear  CVS: Reveals the PMI to be normally located. Regular rhythm, First and Second Heart sounds are normal,  absence of murmurs, rubs or gallops. Peripheral vasculature: Radial pulses: normal Dorsal pedis pulses: normal Posterior pulses: normal  ABDOMEN:  Appearance: normal Benign, no  organomegaly, no masses, no Abdominal Aortic enlargement. No Guarding , no rebound. No Bruits. Bowel sounds: normal  RECTAL: N/A GU: N/A  EXTREMETIES: nonedematous.  MUSCULOSKELETAL:  Spine: normal Joints: intact  NEUROLOGIC: oriented to time,place and person; nonfocal. Strength is normal Sensory is normal Reflexes are normal Cranial Nerves are normal.  ASSESSMENT:   HTN (hypertension) - Plan: CMP14+EGFR, amLODipine (NORVASC) 10 MG tablet  Poor compliance  ANXIETY  Atrial fibrillation with RVR  Coronary atherosclerosis of native coronary artery  GERD  HYPERLIPIDEMIA - Plan: CMP14+EGFR, NMR, lipoprofile  Hyperglycemia  OSTEOPOROSIS  PLAN:  .based on what the patient and her husband has said she has been taking atenolol in the morning and occasionally takes a  amlodipine. And when she does take 10 mg she gets dizzy.  Orders Placed This Encounter  Procedures  . CMP14+EGFR  . NMR, lipoprofile   Meds ordered this encounter  Medications  . amLODipine (NORVASC) 10 MG tablet    Sig: Take 0.5 tablets (5 mg total) by mouth daily after supper.   Medications Discontinued During This Encounter  Medication Reason  . amLODipine (NORVASC) 10 MG tablet Reorder   Return in about 6 weeks (around 10/25/2013) for recheck BP.  Fidel Caggiano P. Modesto Charon, M.D.

## 2013-09-15 LAB — CMP14+EGFR
ALT: 10 IU/L (ref 0–32)
AST: 19 IU/L (ref 0–40)
Albumin/Globulin Ratio: 1.5 (ref 1.1–2.5)
Albumin: 4.1 g/dL (ref 3.5–4.8)
Alkaline Phosphatase: 81 IU/L (ref 39–117)
BUN/Creatinine Ratio: 17 (ref 11–26)
BUN: 12 mg/dL (ref 8–27)
CO2: 27 mmol/L (ref 18–29)
Calcium: 8.9 mg/dL (ref 8.6–10.2)
Chloride: 99 mmol/L (ref 97–108)
Creatinine, Ser: 0.71 mg/dL (ref 0.57–1.00)
GFR calc Af Amer: 97 mL/min/{1.73_m2} (ref >59)
GFR calc non Af Amer: 84 mL/min/{1.73_m2} (ref >59)
Globulin, Total: 2.8 g/dL (ref 1.5–4.5)
Glucose: 96 mg/dL (ref 65–99)
Potassium: 4.5 mmol/L (ref 3.5–5.2)
Sodium: 139 mmol/L (ref 134–144)
Total Bilirubin: <0.2 mg/dL (ref 0.0–1.2)
Total Protein: 6.9 g/dL (ref 6.0–8.5)

## 2013-09-15 LAB — NMR, LIPOPROFILE
Cholesterol: 174 mg/dL (ref <200)
HDL Cholesterol by NMR: 45 mg/dL (ref >=40)
HDL Particle Number: 38.4 umol/L (ref >=30.5)
LDL Particle Number: 1656 nmol/L — ABNORMAL HIGH (ref <1000)
LDL Size: 20.3 nm — ABNORMAL LOW (ref >20.5)
LDLC SERPL CALC-MCNC: 61 mg/dL (ref <100)
LP-IR Score: 80 — ABNORMAL HIGH (ref <=45)
Small LDL Particle Number: 1043 nmol/L — ABNORMAL HIGH (ref <=527)
Triglycerides by NMR: 338 mg/dL — ABNORMAL HIGH (ref <150)

## 2013-09-16 NOTE — Progress Notes (Signed)
Quick Note:  Call Patient Labs abnormal: Triglycerides too high  Recommendations: Limit rapidly digested carbohydrates and sugars.   ______

## 2013-09-18 ENCOUNTER — Ambulatory Visit: Payer: Medicare HMO | Admitting: Family Medicine

## 2013-10-11 ENCOUNTER — Other Ambulatory Visit: Payer: Self-pay | Admitting: Family Medicine

## 2013-10-11 DIAGNOSIS — I10 Essential (primary) hypertension: Secondary | ICD-10-CM

## 2013-10-15 MED ORDER — AMLODIPINE BESYLATE 10 MG PO TABS: 5.0000 mg | ORAL_TABLET | Freq: Every day | ORAL | Status: DC

## 2013-11-08 HISTORY — PX: PROLAPSED UTERINE FIBROID LIGATION: SHX5400

## 2013-12-14 ENCOUNTER — Ambulatory Visit (INDEPENDENT_AMBULATORY_CARE_PROVIDER_SITE_OTHER): Payer: Medicare HMO | Admitting: Family Medicine

## 2013-12-14 ENCOUNTER — Encounter: Payer: Self-pay | Admitting: Family Medicine

## 2013-12-14 VITALS — BP 114/70 | HR 80 | Temp 98.1°F | Ht 62.0 in | Wt 127.2 lb

## 2013-12-14 DIAGNOSIS — K625 Hemorrhage of anus and rectum: Secondary | ICD-10-CM

## 2013-12-14 DIAGNOSIS — K219 Gastro-esophageal reflux disease without esophagitis: Secondary | ICD-10-CM

## 2013-12-14 DIAGNOSIS — M810 Age-related osteoporosis without current pathological fracture: Secondary | ICD-10-CM

## 2013-12-14 DIAGNOSIS — I080 Rheumatic disorders of both mitral and aortic valves: Secondary | ICD-10-CM

## 2013-12-14 DIAGNOSIS — R7309 Other abnormal glucose: Secondary | ICD-10-CM

## 2013-12-14 DIAGNOSIS — Z91199 Patient's noncompliance with other medical treatment and regimen due to unspecified reason: Secondary | ICD-10-CM

## 2013-12-14 DIAGNOSIS — Z9119 Patient's noncompliance with other medical treatment and regimen: Secondary | ICD-10-CM

## 2013-12-14 DIAGNOSIS — R739 Hyperglycemia, unspecified: Secondary | ICD-10-CM

## 2013-12-14 DIAGNOSIS — E785 Hyperlipidemia, unspecified: Secondary | ICD-10-CM

## 2013-12-14 DIAGNOSIS — I4891 Unspecified atrial fibrillation: Secondary | ICD-10-CM

## 2013-12-14 DIAGNOSIS — F411 Generalized anxiety disorder: Secondary | ICD-10-CM

## 2013-12-14 DIAGNOSIS — K5900 Constipation, unspecified: Secondary | ICD-10-CM

## 2013-12-14 DIAGNOSIS — I480 Paroxysmal atrial fibrillation: Secondary | ICD-10-CM

## 2013-12-14 DIAGNOSIS — K5903 Drug induced constipation: Secondary | ICD-10-CM | POA: Insufficient documentation

## 2013-12-14 DIAGNOSIS — I10 Essential (primary) hypertension: Secondary | ICD-10-CM

## 2013-12-14 MED ORDER — ALPRAZOLAM 0.25 MG PO TABS: 0.2500 mg | ORAL_TABLET | Freq: Two times a day (BID) | ORAL | Status: DC | PRN

## 2013-12-14 MED ORDER — DOCUSATE CALCIUM 240 MG PO CAPS: 240.0000 mg | ORAL_CAPSULE | Freq: Every day | ORAL | Status: DC

## 2013-12-14 MED ORDER — POLYETHYLENE GLYCOL 3350 17 GM/SCOOP PO POWD: 17.0000 g | Freq: Every day | ORAL | Status: DC

## 2013-12-14 NOTE — Progress Notes (Signed)
Patient ID: Norma Barajas, female   DOB: [birth year], 73 y.o.   MRN: [id number] SUBJECTIVE: CC: Chief Complaint  Patient presents with  . Follow-up    3 month follow up  states sick to stomach no nausea no vomiting states took "meta biotic " after she got sick and strains with bowels every am and states saw blood Tuesday am. c/o lot of gas   . Medication Refill    wants xanax refilled    HPI: Patient is here for follow up of hyperlipidemia/HTN: denies Headache;denies Chest Pain;denies weakness;denies Shortness of Breath and orthopnea;denies Visual changes;denies palpitations;denies cough;denies pedal edema;denies symptoms of TIA or stroke;deniesClaudication symptoms. admits to Compliance with medications; denies Problems with medications.  Was constipated for 3 days. Then strained and hurt her hemorrhoids and had loose stools for a couple of days. Stomach was hurting after the 3 days of constipation. Last BM was yesterday evening. Gaseous today. Used some laxative. No fever. Feels good right now. No pain. Just wants a laxative. Patient tried probiotics. Past Medical History  Diagnosis Date  . Essential hypertension, benign   . Mitral regurgitation     Mild 09/2012  . Erosive esophagitis   . GERD (gastroesophageal reflux disease)   . Hypercholesterolemia   . Coronary atherosclerosis of native coronary artery     Nonobstructive CAD 10/2005  . Paroxysmal atrial fibrillation   . Diastolic dysfunction     Grade 1. Ejection fraction 60-65%.   Past Surgical History  Procedure Laterality Date  . Abdominal hysterectomy    . Prolapsed uterine fibroid ligation    . Cataract extraction    . Steel plate in the leg      broke tibia and fibula after falling down stairs  . Esophagogastroduodenoscopy  2001    Dr. [name]: erosive esophagitis, esophageal stricture, duodenitis, s/p Savary dilation  . Colonoscopy  2008    Dr. [name]: internal hemorrhoids   . Esophagogastroduodenoscopy (egd)  with esophageal dilation  11/15/2012    NID:[id number] web was found & MOST LIKELY CAUSE FOR DYAPHAGIA/Polyp was found in the gastric body and gastric fundus  . Bravo ph study  11/15/2012    Procedure: BRAVO [place];  Surgeon: [name], MD;  Location: [facility];  Service: Endoscopy;;   History   Social History  . Marital Status: Married    Spouse Name: N/A    Number of Children: N/A  . Years of Education: N/A   Occupational History  . Retired     Tourist information centre manager   Social History Main Topics  . Smoking status: Never Smoker   . Smokeless tobacco: Not on file  . Alcohol Use: No  . Drug Use: No  . Sexual Activity: Not on file   Other Topics Concern  . Not on file   Social History Narrative   Married   No regular exercise   Family History  Problem Relation Age of Onset  . Colon cancer Mother     Diagnosed at age 35   Current Outpatient Prescriptions on File Prior to Visit  Medication Sig Dispense Refill  . amLODipine (NORVASC) 10 MG tablet Take 0.5 tablets (5 mg total) by mouth daily after supper.  90 tablet  1  . aspirin EC 81 MG tablet Take 81 mg by mouth daily.      Marland Kitchen atenolol (TENORMIN) 50 MG tablet Take 0.5 tablets (25 mg total) by mouth daily.  30 tablet  3  . calcium citrate-vitamin D 200-200 MG-UNIT TABS Take  1 tablet by mouth every morning.       . Multiple Vitamin (MULTIVITAMIN) capsule Take 1 capsule by mouth every morning.       Marland Kitchen omeprazole (PRILOSEC) 20 MG capsule Take 1 capsule (20 mg total) by mouth 2 (two) times daily.  180 capsule  3  . simvastatin (ZOCOR) 40 MG tablet Take 1 tablet (40 mg total) by mouth every evening.  90 tablet  3   No current facility-administered medications on file prior to visit.   Allergies  Allergen Reactions  . Crestor [Rosuvastatin Calcium] Other (See Comments)    weakness  . Iodine     ivp dye  . Iohexol   . Morphine   . Risedronate Sodium Other (See Comments)    ACTONEL REACTION: reflux  . Triamterene-Hctz Other (See  Comments)    weakness   Immunization History  Administered Date(s) Administered  . DTaP 08/27/2009  . Influenza Whole 08/04/2010  . Influenza,inj,Quad PF,36+ Mos 08/20/2013   Prior to Admission medications   Medication Sig Start Date End Date Taking? Authorizing Provider  ALPRAZolam (XANAX) 0.25 MG tablet Take 1 tablet (0.25 mg total) by mouth 2 (two) times daily as needed for sleep or anxiety. For anxiety/nerves 05/14/13   Vernie Shanks, MD  amLODipine (NORVASC) 10 MG tablet Take 0.5 tablets (5 mg total) by mouth daily after supper. 10/12/13   Vernie Shanks, MD  aspirin EC 81 MG tablet Take 81 mg by mouth daily.    Historical Provider, MD  atenolol (TENORMIN) 50 MG tablet Take 0.5 tablets (25 mg total) by mouth daily. 07/24/13   Kathie Dike, MD  calcium citrate-vitamin D 200-200 MG-UNIT TABS Take 1 tablet by mouth every morning.     Historical Provider, MD  Multiple Vitamin (MULTIVITAMIN) capsule Take 1 capsule by mouth every morning.     Historical Provider, MD  omeprazole (PRILOSEC) 20 MG capsule Take 1 capsule (20 mg total) by mouth 2 (two) times daily. 05/14/13   Vernie Shanks, MD  simvastatin (ZOCOR) 40 MG tablet Take 1 tablet (40 mg total) by mouth every evening. 05/14/13   Vernie Shanks, MD     ROS: As above in the HPI. All other systems are stable or negative.  OBJECTIVE: APPEARANCE:  Patient in no acute distress.The patient appeared well nourished and normally developed. Acyanotic. Waist: VITAL SIGNS:BP 114/70  Pulse 80  Temp(Src) 98.1 F (36.7 C) (Oral)  Ht $R'5\' 2"'CX$  (1.575 m)  Wt 127 lb 3.2 oz (57.698 kg)  BMI 23.26 kg/m2 WF  SKIN: warm and  Dry without overt rashes, tattoos and scars  HEAD and Neck: without JVD, Head and scalp: normal Eyes:No scleral icterus. Fundi normal, eye movements normal. Ears: Auricle normal, canal normal, Tympanic membranes normal, insufflation normal. Nose: normal Throat: normal Neck & thyroid: normal  CHEST & LUNGS: Chest wall:  normal Lungs: Clear  CVS: Reveals the PMI to be normally located. Regular rhythm, First and Second Heart sounds are normal,  absence of murmurs, rubs or gallops.  ABDOMEN:  Appearance: normal Benign, no organomegaly, no masses, no Abdominal Aortic enlargement. No Guarding , no rebound. No Bruits. Bowel sounds: normal  RECTAL: patient declines and is aware of the risks. GU: N/A  EXTREMETIES: nonedematous.   NEUROLOGIC: oriented to time,place and person; nonfocal.  ASSESSMENT:  RECTAL BLEEDING - Plan: POCT CBC, Fecal occult blood, imunochemical  Constipation - Plan: docusate calcium (SURFAK) 240 MG capsule, polyethylene glycol powder (GLYCOLAX/MIRALAX) powder  ANXIETY - Plan: ALPRAZolam (  XANAX) 0.25 MG tablet  Poor compliance  PAF (paroxysmal atrial fibrillation)  MITRAL REGURGITATION  HYPERTENSION - Plan: CMP14+EGFR  HYPERLIPIDEMIA - Plan: CMP14+EGFR, NMR, lipoprofile  Hyperglycemia  OSTEOPOROSIS  GERD  Atrial fibrillation with RVR Patient declines any further evaluation of the episode of rectal bleed and only wants a Rx for constipation.  PLAN: Discussed with patient that her event of constipation and rectal bleeding could be a harbinger of something more s erious. She declines any GI evaluation and wante only something to overcome the constipation.  Orders Placed This Encounter  Procedures  . Fecal occult blood, imunochemical  . CMP14+EGFR    Standing Status: Future     Number of Occurrences:      Standing Expiration Date: 12/14/2014    Order Specific Question:  Has the patient fasted?    Answer:  Yes  . NMR, lipoprofile    Standing Status: Future     Number of Occurrences:      Standing Expiration Date: 12/14/2014  . POCT CBC    Standing Status: Future     Number of Occurrences:      Standing Expiration Date: 12/28/2013   Meds ordered this encounter  Medications  . docusate calcium (SURFAK) 240 MG capsule    Sig: Take 1 capsule (240 mg total) by  mouth daily.    Dispense:  30 capsule    Refill:  3  . polyethylene glycol powder (GLYCOLAX/MIRALAX) powder    Sig: Take 17 g by mouth daily. Until constipation resolved    Dispense:  3350 g    Refill:  1  . ALPRAZolam (XANAX) 0.25 MG tablet    Sig: Take 1 tablet (0.25 mg total) by mouth 2 (two) times daily as needed for sleep or anxiety. For anxiety/nerves    Dispense:  60 tablet    Refill:  2   Medications Discontinued During This Encounter  Medication Reason  . ALPRAZolam (XANAX) 0.25 MG tablet Reorder   Return in about 2 weeks (around 12/28/2013) for Recheck medical problems. If any acute deterioration patient would need ED evaluation. Patient  and her husband agree.   Lilli Dewald P. Jacelyn Grip, M.D.

## 2013-12-18 ENCOUNTER — Other Ambulatory Visit (INDEPENDENT_AMBULATORY_CARE_PROVIDER_SITE_OTHER): Payer: Medicare HMO

## 2013-12-18 DIAGNOSIS — E785 Hyperlipidemia, unspecified: Secondary | ICD-10-CM

## 2013-12-18 DIAGNOSIS — I10 Essential (primary) hypertension: Secondary | ICD-10-CM

## 2013-12-18 DIAGNOSIS — K625 Hemorrhage of anus and rectum: Secondary | ICD-10-CM

## 2013-12-18 LAB — POCT CBC
Granulocyte percent: 54 %G (ref 37–80)
HCT, POC: 40.6 % (ref 37.7–47.9)
Hemoglobin: 13.4 g/dL (ref 12.2–16.2)
Lymph, poc: 2.1 (ref 0.6–3.4)
MCH, POC: 27.6 pg (ref 27–31.2)
MCHC: 33 g/dL (ref 31.8–35.4)
MCV: 83.6 fL (ref 80–97)
MPV: 8.1 fL (ref 0–99.8)
POC Granulocyte: 3 (ref 2–6.9)
POC LYMPH PERCENT: 38.2 %L (ref 10–50)
Platelet Count, POC: 217 10*3/uL (ref 142–424)
RBC: 4.9 M/uL (ref 4.04–5.48)
RDW, POC: 15.4 %
WBC: 5.6 10*3/uL (ref 4.6–10.2)

## 2013-12-18 NOTE — Progress Notes (Signed)
Pt came in for labs only 

## 2013-12-20 LAB — CMP14+EGFR
ALT: 21 IU/L (ref 0–32)
AST: 26 IU/L (ref 0–40)
Albumin/Globulin Ratio: 1.6 (ref 1.1–2.5)
Albumin: 4.4 g/dL (ref 3.5–4.8)
Alkaline Phosphatase: 85 IU/L (ref 39–117)
BUN/Creatinine Ratio: 9 — ABNORMAL LOW (ref 11–26)
BUN: 7 mg/dL — ABNORMAL LOW (ref 8–27)
CO2: 25 mmol/L (ref 18–29)
Calcium: 9.2 mg/dL (ref 8.7–10.3)
Chloride: 103 mmol/L (ref 97–108)
Creatinine, Ser: 0.78 mg/dL (ref 0.57–1.00)
GFR calc Af Amer: 87 mL/min/{1.73_m2} (ref >59)
GFR calc non Af Amer: 75 mL/min/{1.73_m2} (ref >59)
Globulin, Total: 2.7 g/dL (ref 1.5–4.5)
Glucose: 92 mg/dL (ref 65–99)
Potassium: 4 mmol/L (ref 3.5–5.2)
Sodium: 146 mmol/L — ABNORMAL HIGH (ref 134–144)
Total Bilirubin: 0.2 mg/dL (ref 0.0–1.2)
Total Protein: 7.1 g/dL (ref 6.0–8.5)

## 2013-12-20 LAB — NMR, LIPOPROFILE
Cholesterol: 155 mg/dL (ref <200)
HDL Cholesterol by NMR: 42 mg/dL (ref >=40)
HDL Particle Number: 31.2 umol/L (ref >=30.5)
LDL Particle Number: 1256 nmol/L — ABNORMAL HIGH (ref <1000)
LDL Size: 20.2 nm — ABNORMAL LOW (ref >20.5)
LDLC SERPL CALC-MCNC: 72 mg/dL (ref <100)
LP-IR Score: 66 — ABNORMAL HIGH (ref <=45)
Small LDL Particle Number: 788 nmol/L — ABNORMAL HIGH (ref <=527)
Triglycerides by NMR: 204 mg/dL — ABNORMAL HIGH (ref <150)

## 2014-01-21 ENCOUNTER — Ambulatory Visit: Payer: Medicare HMO | Admitting: Family Medicine

## 2014-01-22 ENCOUNTER — Ambulatory Visit (INDEPENDENT_AMBULATORY_CARE_PROVIDER_SITE_OTHER): Payer: Medicare HMO | Admitting: Family Medicine

## 2014-01-22 ENCOUNTER — Encounter: Payer: Self-pay | Admitting: Family Medicine

## 2014-01-22 VITALS — BP 175/78 | HR 76 | Temp 97.5°F | Ht 62.0 in | Wt 128.2 lb

## 2014-01-22 DIAGNOSIS — R51 Headache: Secondary | ICD-10-CM

## 2014-01-22 MED ORDER — BUTALBITAL-APAP-CAFFEINE 50-325-40 MG PO TABS: 1.0000 | ORAL_TABLET | Freq: Four times a day (QID) | ORAL | Status: DC | PRN

## 2014-01-22 NOTE — Progress Notes (Signed)
   Subjective:    Patient ID: Norma Barajas, female    DOB: July 11, 1941, 73 y.o.   MRN: [id number]  HPI This 73 y.o. female presents for evaluation of right sided pounding headache, phonophotophobia, Blurred vision, tinnitis, dizziness, and anxiety.  She has hx of tinnitis and she has been to ENT and she States she was told she could get a hearing aide.  She has hx of chronic vertigo.  She  States the vertigo started abruptly and then she developed a headache.  She has hx of migraines and she states She has everything she usually gets with migraines except the scintillating scotomas..   Review of Systems C/o vertigo, dizziness, blurred vision, and tinnitis. No chest pain, SOB, HA, dizziness, vision change, N/V, diarrhea, constipation, dysuria, urinary urgency or frequency, myalgias, arthralgias or rash.     Objective:   Physical Exam  Vital signs noted  Well developed well nourished elderly female.  HEENT - Head atraumatic Normocephalic                Eyes - PERRLA, Conjuctiva - clear Sclera- Clear EOMI                Ears - EAC's Wnl TM's Wnl Gross Hearing WNL                Nose - Nares patent                 Throat - oropharanx wnl Respiratory - Lungs CTA bilateral Cardiac - RRR S1 and S2 without murmur GI - Abdomen soft Nontender and bowel sounds active x 4 Neuro - Grossly intact.      Assessment & Plan:  Headache(784.0) - Plan: butalbital-acetaminophen-caffeine (FIORICET) 50-325-40 MG per tablet, DISCONTINUED: butalbital-acetaminophen-caffeine (FIORICET) 50-325-40 MG per tablet  Bystolic 5mg  one po given and after 10 minutes her headache starts to diminish.  She Has vertigo probably due to bpv and she became anxious and she developed a migraine And this is starting to ease off.  Follow up prn.  Boaz

## 2014-02-14 ENCOUNTER — Encounter: Payer: Self-pay | Admitting: *Deleted

## 2014-03-04 ENCOUNTER — Ambulatory Visit (INDEPENDENT_AMBULATORY_CARE_PROVIDER_SITE_OTHER): Payer: Medicare HMO | Admitting: General Practice

## 2014-03-04 ENCOUNTER — Encounter: Payer: Self-pay | Admitting: General Practice

## 2014-03-04 VITALS — BP 135/84 | HR 68 | Temp 97.0°F | Ht 62.0 in | Wt 130.0 lb

## 2014-03-04 DIAGNOSIS — I4891 Unspecified atrial fibrillation: Secondary | ICD-10-CM

## 2014-03-04 DIAGNOSIS — I10 Essential (primary) hypertension: Secondary | ICD-10-CM

## 2014-03-04 DIAGNOSIS — F411 Generalized anxiety disorder: Secondary | ICD-10-CM

## 2014-03-04 DIAGNOSIS — K219 Gastro-esophageal reflux disease without esophagitis: Secondary | ICD-10-CM

## 2014-03-04 DIAGNOSIS — E785 Hyperlipidemia, unspecified: Secondary | ICD-10-CM

## 2014-03-04 MED ORDER — SIMVASTATIN 40 MG PO TABS: 40.0000 mg | ORAL_TABLET | Freq: Every evening | ORAL | Status: DC

## 2014-03-04 MED ORDER — AMLODIPINE BESYLATE 10 MG PO TABS: 5.0000 mg | ORAL_TABLET | Freq: Every day | ORAL | Status: DC

## 2014-03-04 MED ORDER — ATENOLOL 50 MG PO TABS: 25.0000 mg | ORAL_TABLET | Freq: Every day | ORAL | Status: DC

## 2014-03-04 MED ORDER — ALPRAZOLAM 0.25 MG PO TABS: 0.2500 mg | ORAL_TABLET | Freq: Two times a day (BID) | ORAL | Status: DC | PRN

## 2014-03-04 MED ORDER — OMEPRAZOLE 20 MG PO CPDR: 20.0000 mg | DELAYED_RELEASE_CAPSULE | Freq: Two times a day (BID) | ORAL | Status: DC

## 2014-03-04 NOTE — Progress Notes (Signed)
   Subjective:    Patient ID: Norma Barajas, female    DOB: [birth year], 73 y.o.   MRN: [id number]  HPI Patient presents today for chronic health follow up. History of htn, gerd, hld, anxiety, and afib. Taking medications as prescribed. Eating healthy and walking daily for exercise. Denies any complaints at this time.     Review of Systems  Constitutional: Negative for fever and chills.  Respiratory: Negative for chest tightness and shortness of breath.   Cardiovascular: Negative for chest pain and palpitations.  Gastrointestinal: Negative for vomiting, abdominal pain, diarrhea, constipation and blood in stool.  Genitourinary: Negative for difficulty urinating.  Neurological: Negative for dizziness, weakness and headaches.       Objective:   Physical Exam  Constitutional: She is oriented to person, place, and time. She appears well-developed and well-nourished.  HENT:  Head: Normocephalic and atraumatic.  Right Ear: External ear normal.  Left Ear: External ear normal.  Eyes: EOM are normal. Pupils are equal, round, and reactive to light.  Neck: Normal range of motion. Neck supple. No thyromegaly present.  Cardiovascular: Normal rate, regular rhythm and normal heart sounds.   Pulmonary/Chest: Effort normal and breath sounds normal. No respiratory distress. She exhibits no tenderness.  Abdominal: Soft. Bowel sounds are normal. She exhibits no distension. There is no tenderness.  Musculoskeletal: Normal range of motion.  Lymphadenopathy:    She has no cervical adenopathy.  Neurological: She is alert and oriented to person, place, and time.  Skin: Skin is warm and dry.  Psychiatric: She has a normal mood and affect.          Assessment & Plan:  1. HYPERLIPIDEMIA  - simvastatin (ZOCOR) 40 MG tablet; Take 1 tablet (40 mg total) by mouth every evening.  Dispense: 90 tablet; Refill: 3  2. GERD  - omeprazole (PRILOSEC) 20 MG capsule; Take 1 capsule (20 mg total) by mouth 2 (two)  times daily.  Dispense: 180 capsule; Refill: 3  3. HYPERTENSION   4. Atrial fibrillation with RVR   5. HTN (hypertension)  - amLODipine (NORVASC) 10 MG tablet; Take 0.5 tablets (5 mg total) by mouth daily after supper.  Dispense: 90 tablet; Refill: 1 - atenolol (TENORMIN) 50 MG tablet; Take 0.5 tablets (25 mg total) by mouth daily.  Dispense: 90 tablet; Refill: 1  6. ANXIETY  - ALPRAZolam (XANAX) 0.25 MG tablet; Take 1 tablet (0.25 mg total) by mouth 2 (two) times daily as needed for sleep or anxiety. For anxiety/nerves  Dispense: 60 tablet; Refill: 2  -Continue all current medications Labs pending F/u in 2 weeks for labs (too soon for insurance to cover) Discussed benefits of regular exercise and healthy eating Patient verbalized understanding Erby Pian, FNP-C

## 2014-03-04 NOTE — Patient Instructions (Signed)

## 2014-03-05 ENCOUNTER — Ambulatory Visit: Payer: Medicare HMO | Admitting: General Practice

## 2014-03-11 ENCOUNTER — Ambulatory Visit: Payer: Medicare HMO | Admitting: General Practice

## 2014-03-19 ENCOUNTER — Other Ambulatory Visit (INDEPENDENT_AMBULATORY_CARE_PROVIDER_SITE_OTHER): Payer: Medicare HMO

## 2014-03-19 DIAGNOSIS — E785 Hyperlipidemia, unspecified: Secondary | ICD-10-CM

## 2014-03-19 DIAGNOSIS — I10 Essential (primary) hypertension: Secondary | ICD-10-CM

## 2014-03-19 NOTE — Progress Notes (Signed)
Labs only

## 2014-03-20 LAB — NMR, LIPOPROFILE
Cholesterol: 198 mg/dL (ref 100–199)
HDL Cholesterol by NMR: 45 mg/dL (ref >39)
HDL Particle Number: 32.5 umol/L (ref >=30.5)
LDL Particle Number: 1666 nmol/L — ABNORMAL HIGH (ref <1000)
LDL Size: 20.2 nm (ref >20.5)
LDLC SERPL CALC-MCNC: 101 mg/dL — ABNORMAL HIGH (ref 0–99)
LP-IR Score: 77 — ABNORMAL HIGH (ref <=45)
Small LDL Particle Number: 935 nmol/L — ABNORMAL HIGH (ref <=527)
Triglycerides by NMR: 258 mg/dL — ABNORMAL HIGH (ref 0–149)

## 2014-03-20 LAB — CMP14+EGFR
ALT: 9 IU/L (ref 0–32)
AST: 20 IU/L (ref 0–40)
Albumin/Globulin Ratio: 1.6 (ref 1.1–2.5)
Albumin: 4.4 g/dL (ref 3.5–4.8)
Alkaline Phosphatase: 82 IU/L (ref 39–117)
BUN/Creatinine Ratio: 11 (ref 11–26)
BUN: 10 mg/dL (ref 8–27)
CO2: 24 mmol/L (ref 18–29)
Calcium: 9.1 mg/dL (ref 8.7–10.3)
Chloride: 103 mmol/L (ref 97–108)
Creatinine, Ser: 0.87 mg/dL (ref 0.57–1.00)
GFR calc Af Amer: 75 mL/min/{1.73_m2} (ref >59)
GFR calc non Af Amer: 65 mL/min/{1.73_m2} (ref >59)
Globulin, Total: 2.7 g/dL (ref 1.5–4.5)
Glucose: 91 mg/dL (ref 65–99)
Potassium: 4.2 mmol/L (ref 3.5–5.2)
Sodium: 144 mmol/L (ref 134–144)
Total Bilirubin: 0.2 mg/dL (ref 0.0–1.2)
Total Protein: 7.1 g/dL (ref 6.0–8.5)

## 2014-06-05 ENCOUNTER — Encounter: Payer: Self-pay | Admitting: Family

## 2014-06-05 ENCOUNTER — Ambulatory Visit (INDEPENDENT_AMBULATORY_CARE_PROVIDER_SITE_OTHER): Payer: Medicare HMO | Admitting: Family

## 2014-06-05 VITALS — BP 156/77 | HR 66 | Temp 97.7°F | Ht 62.0 in | Wt 133.2 lb

## 2014-06-05 DIAGNOSIS — E785 Hyperlipidemia, unspecified: Secondary | ICD-10-CM

## 2014-06-05 DIAGNOSIS — K219 Gastro-esophageal reflux disease without esophagitis: Secondary | ICD-10-CM

## 2014-06-05 DIAGNOSIS — I10 Essential (primary) hypertension: Secondary | ICD-10-CM

## 2014-06-05 DIAGNOSIS — Z1321 Encounter for screening for nutritional disorder: Secondary | ICD-10-CM

## 2014-06-05 DIAGNOSIS — F411 Generalized anxiety disorder: Secondary | ICD-10-CM

## 2014-06-05 MED ORDER — ALPRAZOLAM 0.25 MG PO TABS: 0.2500 mg | ORAL_TABLET | Freq: Two times a day (BID) | ORAL | Status: DC | PRN

## 2014-06-05 NOTE — Progress Notes (Signed)
Subjective:    Patient ID: Norma Barajas, female    DOB: September 01, 1941, 73 y.o.   MRN: [id number]  Hyperlipidemia This is a chronic problem. The current episode started more than 1 year ago. The problem is controlled. Recent lipid tests were reviewed and are normal. She has no history of diabetes or hypothyroidism. Pertinent negatives include no focal sensory loss, leg pain, myalgias or shortness of breath. Current antihyperlipidemic treatment includes statins. The current treatment provides moderate improvement of lipids. Risk factors for coronary artery disease include dyslipidemia, hypertension, post-menopausal, a sedentary lifestyle and family history.  Hypertension This is a chronic problem. The current episode started more than 1 year ago. The problem has been waxing and waning since onset. The problem is uncontrolled. Associated symptoms include anxiety. Pertinent negatives include no headaches, palpitations, peripheral edema or shortness of breath. Risk factors for coronary artery disease include dyslipidemia, family history and post-menopausal state. Past treatments include beta blockers and calcium channel blockers. The current treatment provides significant improvement. There is no history of kidney disease, CAD/MI, heart failure or a thyroid problem. There is no history of sleep apnea.  Anxiety Presents for follow-up visit. Symptoms include excessive worry and nervous/anxious behavior. Patient reports no depressed mood, hyperventilation, irritability, palpitations, restlessness or shortness of breath. Symptoms occur occasionally. The severity of symptoms is mild. The symptoms are aggravated by family issues.   Her past medical history is significant for anxiety/panic attacks and arrhythmia. There is no history of depression. Past treatments include benzodiazephines.  Gastrophageal Reflux She reports no belching, no coughing, no heartburn, no sore throat or no tooth decay. This is a chronic  problem. The current episode started more than 1 year ago. The problem occurs rarely. The problem has been resolved. The symptoms are aggravated by certain foods. Pertinent negatives include no fatigue or muscle weakness. She has tried a PPI for the symptoms. The treatment provided significant relief.      Review of Systems  Constitutional: Negative.  Negative for irritability and fatigue.  HENT: Negative.  Negative for sore throat.   Eyes: Negative.   Respiratory: Negative.  Negative for cough and shortness of breath.   Cardiovascular: Negative.  Negative for palpitations.  Gastrointestinal: Negative.  Negative for heartburn.  Endocrine: Negative.   Genitourinary: Negative.   Musculoskeletal: Negative.  Negative for muscle weakness and myalgias.  Neurological: Negative.  Negative for headaches.  Hematological: Negative.   Psychiatric/Behavioral: The patient is nervous/anxious.   All other systems reviewed and are negative.      Objective:   Physical Exam  Vitals reviewed. Constitutional: She is oriented to person, place, and time. She appears well-developed and well-nourished. No distress.  HENT:  Head: Normocephalic and atraumatic.  Right Ear: External ear normal.  Mouth/Throat: Oropharynx is clear and moist.  Eyes: Pupils are equal, round, and reactive to light.  Neck: Normal range of motion. Neck supple. No thyromegaly present.  Cardiovascular: Normal rate, regular rhythm, normal heart sounds and intact distal pulses.   No murmur heard. Pulmonary/Chest: Effort normal and breath sounds normal. No respiratory distress. She has no wheezes.  Abdominal: Soft. Bowel sounds are normal. She exhibits no distension. There is no tenderness.  Musculoskeletal: Normal range of motion. She exhibits no edema and no tenderness.  Neurological: She is alert and oriented to person, place, and time. She has normal reflexes. No cranial nerve deficit.  Skin: Skin is warm and dry.  Psychiatric:  She has a normal mood and affect. Her behavior is  normal. Judgment and thought content normal.    BP 156/77  Pulse 66  Temp(Src) 97.7 F (36.5 C) (Oral)  Ht _0  (1.575 m)  Wt 133 lb 3.2 oz (60.419 kg)  BMI 24.36 kg/m2       Assessment & Plan:  1. HYPERTENSION - CMP14+EGFR; Future  2. Gastroesophageal reflux disease, esophagitis presence not specified - CMP14+EGFR; Future  3. ANXIETY - CMP14+EGFR; Future - ALPRAZolam (XANAX) 0.25 MG tablet; Take 1 tablet (0.25 mg total) by mouth 2 (two) times daily as needed for sleep or anxiety. For anxiety/nerves  Dispense: 60 tablet; Refill: 2  4. HYPERLIPIDEMIA - CMP14+EGFR; Future - Lipid panel; Future  5. Encounter for vitamin deficiency screening - Vit D  25 hydroxy (rtn osteoporosis monitoring); Future   Continue all meds Labs pending Health Maintenance reviewed Diet and exercise encouraged RTO 3 months  Evelina Dun, FNP

## 2014-06-05 NOTE — Patient Instructions (Signed)

## 2014-06-11 ENCOUNTER — Other Ambulatory Visit (INDEPENDENT_AMBULATORY_CARE_PROVIDER_SITE_OTHER): Payer: Medicare HMO

## 2014-06-11 DIAGNOSIS — I10 Essential (primary) hypertension: Secondary | ICD-10-CM

## 2014-06-11 DIAGNOSIS — E785 Hyperlipidemia, unspecified: Secondary | ICD-10-CM

## 2014-06-11 DIAGNOSIS — F411 Generalized anxiety disorder: Secondary | ICD-10-CM

## 2014-06-11 DIAGNOSIS — K219 Gastro-esophageal reflux disease without esophagitis: Secondary | ICD-10-CM

## 2014-06-11 DIAGNOSIS — Z1321 Encounter for screening for nutritional disorder: Secondary | ICD-10-CM

## 2014-06-12 LAB — LIPID PANEL
CHOL/HDL RATIO: 4.1 ratio (ref 0.0–4.4)
Cholesterol, Total: 188 mg/dL (ref 100–199)
HDL: 46 mg/dL (ref >39)
LDL Calculated: 98 mg/dL (ref 0–99)
Triglycerides: 220 mg/dL — ABNORMAL HIGH (ref 0–149)
VLDL Cholesterol Cal: 44 mg/dL — ABNORMAL HIGH (ref 5–40)

## 2014-06-12 LAB — CMP14+EGFR
A/G RATIO: 1.6 (ref 1.1–2.5)
ALT: 13 IU/L (ref 0–32)
AST: 20 IU/L (ref 0–40)
Albumin: 4.4 g/dL (ref 3.5–4.8)
Alkaline Phosphatase: 79 IU/L (ref 39–117)
BUN/Creatinine Ratio: 10 — ABNORMAL LOW (ref 11–26)
BUN: 9 mg/dL (ref 8–27)
CO2: 25 mmol/L (ref 18–29)
CREATININE: 0.86 mg/dL (ref 0.57–1.00)
Calcium: 9.1 mg/dL (ref 8.7–10.3)
Chloride: 101 mmol/L (ref 97–108)
GFR calc Af Amer: 76 mL/min/{1.73_m2} (ref >59)
GFR, EST NON AFRICAN AMERICAN: 66 mL/min/{1.73_m2} (ref >59)
GLOBULIN, TOTAL: 2.8 g/dL (ref 1.5–4.5)
GLUCOSE: 104 mg/dL — AB (ref 65–99)
Potassium: 3.8 mmol/L (ref 3.5–5.2)
Sodium: 143 mmol/L (ref 134–144)
TOTAL PROTEIN: 7.2 g/dL (ref 6.0–8.5)
Total Bilirubin: 0.3 mg/dL (ref 0.0–1.2)

## 2014-06-12 LAB — VITAMIN D 25 HYDROXY (VIT D DEFICIENCY, FRACTURES): VIT D 25 HYDROXY: 39 ng/mL (ref 30.0–100.0)

## 2014-06-13 ENCOUNTER — Other Ambulatory Visit: Payer: Self-pay | Admitting: Family

## 2014-06-13 ENCOUNTER — Telehealth: Payer: Self-pay | Admitting: *Deleted

## 2014-06-13 MED ORDER — FENOFIBRATE 145 MG PO TABS: 145.0000 mg | ORAL_TABLET | Freq: Every day | ORAL | Status: DC

## 2014-06-13 NOTE — Telephone Encounter (Signed)
Message copied by Shelbie Ammons on Thu Jun 13, 2014  1:22 PM ------      Message from: [place], [place] A      Created: Thu Jun 13, 2014 10:36 AM       Kidney and liver function stable      Triglycerides levels elevated- Pt needs to be on low fat diet- New RX sent to pharmacy      Vit D levels on low side of normal- Would benefit from Vit D OTC ------

## 2014-06-13 NOTE — Telephone Encounter (Signed)
Aware of results,copy of labs mailed to patient.

## 2014-06-24 ENCOUNTER — Ambulatory Visit (INDEPENDENT_AMBULATORY_CARE_PROVIDER_SITE_OTHER): Payer: Medicare HMO | Admitting: Family

## 2014-06-24 ENCOUNTER — Encounter: Payer: Self-pay | Admitting: Family

## 2014-06-24 VITALS — BP 158/79 | HR 64 | Temp 97.9°F | Ht 62.0 in | Wt 133.6 lb

## 2014-06-24 DIAGNOSIS — N8111 Cystocele, midline: Secondary | ICD-10-CM

## 2014-06-24 DIAGNOSIS — N8110 Cystocele, unspecified: Secondary | ICD-10-CM

## 2014-06-24 NOTE — Patient Instructions (Signed)
Cystocele Repair Cystocele repair is surgery to remove a cystocele, which is a bulging, drooping area (hernia) of the bladder that extends into the vagina. This bulging occurs on the top front wall of the vagina. LET [facility] CARE PROVIDER KNOW ABOUT:   Any allergies you have.  All medicines you are taking, including vitamins, herbs, eye drops, creams, and over-the-counter medicines.  Use of steroids (by mouth or creams).  Previous problems you or members of your family have had with the use of anesthetics.  Any blood disorders you have.  Previous surgeries you have had.  Medical conditions you have.  Possibility of pregnancy, if this applies. RISKS AND COMPLICATIONS  Generally, this is a safe procedure. However, as with any procedure, complications can occur. Possible complications include:  Excessive bleeding.  Infection.  Injury to surrounding structures.  Problems related to anesthetics. The risks will vary depending on the type of anesthetic given.  Problems with the urinary catheter after surgery, such as blockage.  Return of the cystocele. BEFORE THE PROCEDURE   Ask your health care provider about changing or stopping your regular medicines. You may need to stop taking certain medicines 1 week before surgery.  Do not eat or drink anything after midnight the night before surgery.  If you smoke, do not smoke for at least 2 weeks before the surgery.  Do not drink any alcohol for 3 days before the surgery.  Arrange for someone to drive you home after your hospital stay and to help you with activities during recovery. PROCEDURE   You will be given a medicine that makes you sleep through the procedure (general anesthetic) or a medicine injected into your spine that numbs your body below the waist (spinal or epidural anesthetic). You will be asleep or be numbed through the entire procedure.  A thin, flexible tube (Foley catheter) will be placed in your bladder to  drain urine during and after the surgery.  The surgery is performed through the vagina. The front wall of the vagina is opened up, and the muscle between the bladder and vagina is pulled up to its normal position. This is reinforced with stitches or a piece of mesh. This removes the hernia so that the top of the vagina does not fall into the opening of the vagina.  The cut on the front wall of the vagina is then closed with absorbable stitches that do not need to be removed. AFTER THE PROCEDURE   You will be taken to a recovery area where your progress will be closely watched. Your breathing, blood pressure, and pulse (vital signs) will be checked often. When you are stable, you will be taken to a regular hospital room.  You will have a catheter in place to drain your bladder. This will stay in place for 2-7 days or until your bladder is working properly on its own.  You may have gauze packing in the vagina. This will be removed 1-2 days after the surgery.  You will be given pain medicine as needed and may be given a medicine that kills germs (antibiotic).  You will likely need to stay in the hospital for 1-2 days. Document Released: 10/22/2000 Document Revised: 06/27/2013 Document Reviewed: 04/13/2013 [facility] Patient Information 2015 [place], [place]. This information is not intended to replace advice given to you by your health care provider. Make sure you discuss any questions you have with your health care provider.

## 2014-06-24 NOTE — Progress Notes (Signed)
   Subjective:    Patient ID: Norma Barajas, female    DOB: 24-Dec-1940, 73 y.o.   MRN: [id number]  HPI Pt presents to the office for a "growth in my private area". Pt states she noticed it Saturday while using the bathroom. Pt denies any pain to the area, drainage, or vaginal discharge. Pt states she got her husband to look at the area and he stated it was a "growth". Pt states she stomach has been hurting at times.    Review of Systems  Constitutional: Negative.   HENT: Negative.   Eyes: Negative.   Respiratory: Negative.  Negative for shortness of breath.   Cardiovascular: Negative.  Negative for palpitations.  Gastrointestinal: Negative.   Endocrine: Negative.   Genitourinary: Negative.   Musculoskeletal: Negative.   Neurological: Negative.  Negative for headaches.  Hematological: Negative.   Psychiatric/Behavioral: Negative.   All other systems reviewed and are negative.      Objective:   Physical Exam  Vitals reviewed. Constitutional: She is oriented to person, place, and time. She appears well-developed and well-nourished. No distress.  Cardiovascular: Normal rate, regular rhythm, normal heart sounds and intact distal pulses.   No murmur heard. Pulmonary/Chest: Effort normal and breath sounds normal. No respiratory distress. She has no wheezes.  Abdominal: Soft. Bowel sounds are normal. She exhibits no distension. There is no tenderness.  Genitourinary:  Bladder prolapse, small abrasion on inside of vaginal area from bladder wall rubbing  Musculoskeletal: Normal range of motion. She exhibits no edema and no tenderness.  Neurological: She is alert and oriented to person, place, and time. She has normal reflexes. No cranial nerve deficit.  Skin: Skin is warm and dry.  Psychiatric: She has a normal mood and affect. Her behavior is normal. Judgment and thought content normal.     BP 158/79  Pulse 64  Temp(Src) 97.9 F (36.6 C) (Oral)  Ht 5\' 2"  (1.575 m)  Wt 133 lb 9.6 oz  (60.601 kg)  BMI 24.43 kg/m2      Assessment & Plan:  1. Female bladder prolapse - Ambulatory referral to Urogynecology  2. Cystocele without uterine prolapse -Pelvic floor strengthen exercises -Empty bladder completely to prevent UTI - Ambulatory referral to Urogynecology  Evelina Dun, FNP

## 2014-07-04 ENCOUNTER — Telehealth: Payer: Self-pay | Admitting: Family

## 2014-07-04 NOTE — Telephone Encounter (Signed)
Patient is having lower abd/pelvic pain. She is urinating normally.  Advised to go to ER if pain worsens or she is unable to urinate.  Her appt with Urology is in October and we will try to move this up. Is there anything else she needs to do in the meantime?

## 2014-07-10 ENCOUNTER — Telehealth: Payer: Self-pay | Admitting: Family Medicine

## 2014-07-10 ENCOUNTER — Ambulatory Visit: Payer: Medicare HMO | Admitting: Family Medicine

## 2014-07-10 NOTE — Telephone Encounter (Signed)
appt scheduled. Patient is going to come in at 3 today to see Rush Landmark but patient is in appt slot for 5:45

## 2014-07-11 NOTE — Telephone Encounter (Signed)
Patient advised to empty her bladder frequently.  According to referral note Dr Fredrik Rigger office was going to review the records and get the patient in to see someone this week. Spoke with patient and the letter she received from their office had an appt date in Oct. She hasn't received any other calls or letters from their office. Will you follow up on the status of this appt?

## 2014-07-11 NOTE — Telephone Encounter (Signed)
Pt needs appointment with Gyn-Urologists (trying to get her in as soon as possible). Until pt's appointment she needs to empty bladder frequently and prevent any  Infection.

## 2014-08-09 ENCOUNTER — Telehealth: Payer: Self-pay

## 2014-08-09 NOTE — Telephone Encounter (Signed)
T/C from Dr. Evie Lacks, said he was scheduled to do Surgery for rectocele on pt Mon. She went today for pre-op and says she is having some trouble with hemorrhoids and he just hates to do the surgery without her being seen for that first. He asked how we could accomodate her. I scheduled her for 1:15 pm with Neil Crouch, PA in urgent. Will send message to Dr. [name] and see if she wants to change that appt or make recommendations. Dr. Evie Lacks was going to tell pt about her appt.

## 2014-08-12 NOTE — Telephone Encounter (Signed)
REVIEWED. AGREE. PT HAS RECENT TCS. CONSIDER FLEX SIG WITH IH BANDING PRIOR TO RECTOCELE REPAIR.

## 2014-08-13 ENCOUNTER — Other Ambulatory Visit: Payer: Self-pay

## 2014-08-13 ENCOUNTER — Ambulatory Visit (INDEPENDENT_AMBULATORY_CARE_PROVIDER_SITE_OTHER): Payer: Commercial Managed Care - HMO | Admitting: Gastroenterology

## 2014-08-13 ENCOUNTER — Encounter: Payer: Self-pay | Admitting: Gastroenterology

## 2014-08-13 VITALS — BP 134/82 | HR 72 | Temp 97.9°F | Ht 62.0 in | Wt 134.6 lb

## 2014-08-13 DIAGNOSIS — K649 Unspecified hemorrhoids: Secondary | ICD-10-CM

## 2014-08-13 NOTE — Patient Instructions (Signed)
1. Flexible sigmoidoscopy with hemorrhoids banding by Dr. [name].

## 2014-08-13 NOTE — Assessment & Plan Note (Addendum)
Definitive treatment of internal hemorrhoids prior to rectocele repair. Flexible sigmoidoscopy with hemorrhoidal banding planned to expedite treatment. CRH banding would require multiple sessions, which would delay her rectocele repair significantly.Patient was not interested in full colonoscopy at this time. Last colonoscopy in 2008 at which time she was noted to have large internal hemorrhoids.  I have discussed the risks, alternatives, benefits with regards to but not limited to the risk of reaction to medication, bleeding, infection, perforation and the patient is agreeable to proceed. Written consent to be obtained.

## 2014-08-13 NOTE — Progress Notes (Signed)
Primary Care Physician:  Redge Gainer, MD  Primary Gastroenterologist:  Barney Drain, MD   Chief Complaint  Patient presents with  . Hemorrhoids    HPI:  Norma Barajas is a 73 y.o. female here for urgent office visit regarding hemorrhoids. Patient was scheduled to have rectocele repair by Dr. Evie Lacks but he requested that we see the patient prior to the surgery for management of hemorrhoids. Rectocele surgery has been postponed. Patient states in August she developed a "mass" coming from her vagina. She was evaluated by her PCP and Dr. Evie Lacks and found to have rectocele. Patient complains of discomfort in the vaginal area. Some lower abdominal discomfort. Bowel movements are regular and soft. No straining. Sees bright red blood on the toilet tissue intermittently. Heartburn well-controlled. Her nausea vomiting. Patient is interested in definitive treatment of her hemorrhoids. Given [place] painting requires several sessions, flexible sigmoidoscopy with hemorrhoid banding better option.   Current Outpatient Prescriptions  Medication Sig Dispense Refill  . ALPRAZolam (XANAX) 0.25 MG tablet Take 1 tablet (0.25 mg total) by mouth 2 (two) times daily as needed for sleep or anxiety. For anxiety/nerves  60 tablet  2  . amLODipine (NORVASC) 10 MG tablet Take 0.5 tablets (5 mg total) by mouth daily after supper.  90 tablet  1  . aspirin EC 81 MG tablet Take 81 mg by mouth daily.      Marland Kitchen atenolol (TENORMIN) 50 MG tablet Take 0.5 tablets (25 mg total) by mouth daily.  90 tablet  1  . calcium citrate-vitamin D 200-200 MG-UNIT TABS Take 1 tablet by mouth every morning.       . Multiple Vitamin (MULTIVITAMIN) capsule Take 1 capsule by mouth every morning.       Marland Kitchen omeprazole (PRILOSEC) 20 MG capsule Take 1 capsule (20 mg total) by mouth 2 (two) times daily.  180 capsule  3  . simvastatin (ZOCOR) 40 MG tablet Take 1 tablet (40 mg total) by mouth every evening.  90 tablet  3  . polyethylene glycol powder  (GLYCOLAX/MIRALAX) powder Take 17 g by mouth daily. Until constipation resolved  3350 g  1   No current facility-administered medications for this visit.    Allergies as of 08/13/2014 - Review Complete 08/13/2014  Allergen Reaction Noted  . Crestor [rosuvastatin calcium] Other (See Comments) 02/18/2011  . Iodine  02/18/2011  . Iohexol  11/03/2005  . Morphine    . Risedronate sodium Other (See Comments) 02/18/2011  . Triamterene-hctz Other (See Comments) 02/18/2011    Past Medical History  Diagnosis Date  . Essential hypertension, benign   . Mitral regurgitation     Mild 09/2012  . Erosive esophagitis   . GERD (gastroesophageal reflux disease)   . Hypercholesterolemia   . Coronary atherosclerosis of native coronary artery     Nonobstructive CAD 10/2005  . Paroxysmal atrial fibrillation   . Diastolic dysfunction     Grade 1. Ejection fraction 60-65%.    Past Surgical History  Procedure Laterality Date  . Abdominal hysterectomy    . Prolapsed uterine fibroid ligation    . Cataract extraction    . Steel plate in the leg      broke tibia and fibula after falling down stairs  . Esophagogastroduodenoscopy  2001    Dr. [name]: erosive esophagitis, esophageal stricture, duodenitis, s/p Savary dilation  . Colonoscopy  2008    Dr. [name]: internal hemorrhoids   . Esophagogastroduodenoscopy (egd) with esophageal dilation  11/15/2012    TJQ:ZESPQZRAQT web  was found & MOST LIKELY CAUSE FOR DYAPHAGIA/Polyp was found in the gastric body and gastric fundus/ gastritis on bx  . Bravo ph study  11/15/2012    Procedure: BRAVO [place];  Surgeon: [name], MD;  Location: [facility];  Service: Endoscopy;;    Family History  Problem Relation Age of Onset  . Colon cancer Mother     Diagnosed at age 41    History   Social History  . Marital Status: Married    Spouse Name: N/A    Number of Children: N/A  . Years of Education: N/A   Occupational History  . Retired     Tourist information centre manager    Social History Main Topics  . Smoking status: Never Smoker   . Smokeless tobacco: Not on file     Comment: Never smoked  . Alcohol Use: No  . Drug Use: No  . Sexual Activity: Not on file   Other Topics Concern  . Not on file   Social History Narrative   Married   No regular exercise      ROS:  General: Negative for anorexia, weight loss, fever, chills, fatigue, weakness. Eyes: Negative for vision changes.  ENT: Negative for hoarseness, difficulty swallowing , nasal congestion. CV: Negative for chest pain, angina, palpitations, dyspnea on exertion, peripheral edema.  Respiratory: Negative for dyspnea at rest, dyspnea on exertion, cough, sputum, wheezing.  GI: See history of present illness. GU:  Negative for dysuria, hematuria, urinary incontinence, urinary frequency, nocturnal urination. See history of present illness MS: Negative for joint pain, low back pain.  Derm: Negative for rash or itching.  Neuro: Negative for weakness, abnormal sensation, seizure, frequent headaches, memory loss, confusion.  Psych: Negative for anxiety, depression, suicidal ideation, hallucinations.  Endo: Negative for unusual weight change.  Heme: Negative for bruising or bleeding. Allergy: Negative for rash or hives.    Physical Examination:  BP 134/82  Pulse 72  Temp(Src) 97.9 F (36.6 C) (Oral)  Ht 5\' 2"  (1.575 m)  Wt 134 lb 9.6 oz (61.054 kg)  BMI 24.61 kg/m2   General: Well-nourished, well-developed in no acute distress.  Head: Normocephalic, atraumatic.   Eyes: Conjunctiva pink, no icterus. Mouth: Oropharyngeal mucosa moist and pink , no lesions erythema or exudate. Neck: Supple without thyromegaly, masses, or lymphadenopathy.  Lungs: Clear to auscultation bilaterally.  Heart: Regular rate and rhythm, no murmurs rubs or gallops.  Abdomen: Bowel sounds are normal, nontender, nondistended, no hepatosplenomegaly or masses, no abdominal bruits or    hernia , no rebound or guarding.    Rectal: Acorn size mass protruding from the vagina. No masses in the rectal vault. Secretions only. No stool present. Nontender exam. Extremities: No lower extremity edema. No clubbing or deformities.  Neuro: Alert and oriented x 4 , grossly normal neurologically.  Skin: Warm and dry, no rash or jaundice.   Psych: Alert and cooperative, normal mood and affect.  Labs: Lab Results  Component Value Date   CREATININE 0.86 06/11/2014   BUN 9 06/11/2014   NA 143 06/11/2014   K 3.8 06/11/2014   CL 101 06/11/2014   CO2 25 06/11/2014   Lab Results  Component Value Date   ALT 13 06/11/2014   AST 20 06/11/2014   ALKPHOS 79 06/11/2014   BILITOT 0.3 06/11/2014   Lab Results  Component Value Date   WBC 5.6 12/18/2013   HGB 13.4 12/18/2013   HCT 40.6 12/18/2013   MCV 83.6 12/18/2013   PLT 211  07/23/2013     Imaging Studies: No results found.

## 2014-08-13 NOTE — Progress Notes (Signed)
cc'ed to pcp °

## 2014-08-16 ENCOUNTER — Encounter (HOSPITAL_COMMUNITY): Payer: Self-pay | Admitting: Pharmacy Technician

## 2014-08-19 ENCOUNTER — Ambulatory Visit (INDEPENDENT_AMBULATORY_CARE_PROVIDER_SITE_OTHER): Payer: Medicare HMO

## 2014-08-19 DIAGNOSIS — Z23 Encounter for immunization: Secondary | ICD-10-CM

## 2014-08-22 ENCOUNTER — Encounter (HOSPITAL_COMMUNITY): Payer: Self-pay | Admitting: *Deleted

## 2014-08-22 ENCOUNTER — Ambulatory Visit (HOSPITAL_COMMUNITY)
Admission: RE | Admit: 2014-08-22 | Discharge: 2014-08-22 | Disposition: A | Discharge status: Home / Self Care | Payer: Medicare HMO | Source: Ambulatory Visit | Attending: Gastroenterology | Admitting: Gastroenterology

## 2014-08-22 ENCOUNTER — Encounter (HOSPITAL_COMMUNITY)
Admission: RE | Disposition: A | Discharge status: Home / Self Care | Payer: Self-pay | Source: Ambulatory Visit | Attending: Gastroenterology

## 2014-08-22 DIAGNOSIS — K5790 Diverticulosis of intestine, part unspecified, without perforation or abscess without bleeding: Secondary | ICD-10-CM | POA: Diagnosis not present

## 2014-08-22 DIAGNOSIS — I2510 Atherosclerotic heart disease of native coronary artery without angina pectoris: Secondary | ICD-10-CM | POA: Diagnosis not present

## 2014-08-22 DIAGNOSIS — K219 Gastro-esophageal reflux disease without esophagitis: Secondary | ICD-10-CM | POA: Insufficient documentation

## 2014-08-22 DIAGNOSIS — K921 Melena: Secondary | ICD-10-CM | POA: Diagnosis present

## 2014-08-22 DIAGNOSIS — E780 Pure hypercholesterolemia: Secondary | ICD-10-CM | POA: Diagnosis not present

## 2014-08-22 DIAGNOSIS — I10 Essential (primary) hypertension: Secondary | ICD-10-CM | POA: Diagnosis not present

## 2014-08-22 DIAGNOSIS — Z7982 Long term (current) use of aspirin: Secondary | ICD-10-CM | POA: Diagnosis not present

## 2014-08-22 DIAGNOSIS — K649 Unspecified hemorrhoids: Secondary | ICD-10-CM

## 2014-08-22 DIAGNOSIS — K642 Third degree hemorrhoids: Secondary | ICD-10-CM | POA: Diagnosis not present

## 2014-08-22 DIAGNOSIS — I340 Nonrheumatic mitral (valve) insufficiency: Secondary | ICD-10-CM | POA: Diagnosis not present

## 2014-08-22 DIAGNOSIS — I480 Paroxysmal atrial fibrillation: Secondary | ICD-10-CM | POA: Insufficient documentation

## 2014-08-22 DIAGNOSIS — Z79899 Other long term (current) drug therapy: Secondary | ICD-10-CM | POA: Diagnosis not present

## 2014-08-22 DIAGNOSIS — Z883 Allergy status to other anti-infective agents status: Secondary | ICD-10-CM | POA: Diagnosis not present

## 2014-08-22 DIAGNOSIS — Z888 Allergy status to other drugs, medicaments and biological substances status: Secondary | ICD-10-CM | POA: Insufficient documentation

## 2014-08-22 DIAGNOSIS — Z885 Allergy status to narcotic agent status: Secondary | ICD-10-CM | POA: Insufficient documentation

## 2014-08-22 DIAGNOSIS — M6289 Other specified disorders of muscle: Secondary | ICD-10-CM | POA: Diagnosis not present

## 2014-08-22 HISTORY — DX: Other specified postprocedural states: R11.2

## 2014-08-22 HISTORY — DX: Unspecified osteoarthritis, unspecified site: M19.90

## 2014-08-22 HISTORY — PX: HEMORRHOID BANDING: SHX5850

## 2014-08-22 HISTORY — DX: Other specified postprocedural states: Z98.890

## 2014-08-22 HISTORY — PX: FLEXIBLE SIGMOIDOSCOPY: SHX5431

## 2014-08-22 SURGERY — SIGMOIDOSCOPY, FLEXIBLE
Anesthesia: Moderate Sedation

## 2014-08-22 MED ORDER — MIDAZOLAM HCL 5 MG/5ML IJ SOLN
INTRAMUSCULAR | Status: AC
  Filled 2014-08-22: qty 10

## 2014-08-22 MED ORDER — STERILE WATER FOR IRRIGATION IR SOLN
Status: DC | PRN
  Administered 2014-08-22: 09:00:00

## 2014-08-22 MED ORDER — LIDOCAINE HCL 2 % EX GEL
CUTANEOUS | Status: AC
  Filled 2014-08-22: qty 30

## 2014-08-22 MED ORDER — MEPERIDINE HCL 100 MG/ML IJ SOLN
INTRAMUSCULAR | Status: AC
  Filled 2014-08-22: qty 2

## 2014-08-22 MED ORDER — MEPERIDINE HCL 100 MG/ML IJ SOLN
INTRAMUSCULAR | Status: DC | PRN
  Administered 2014-08-22: 25 mg via INTRAVENOUS

## 2014-08-22 MED ORDER — SODIUM CHLORIDE 0.9 % IV SOLN
INTRAVENOUS | Status: DC
  Administered 2014-08-22: 1000 mL via INTRAVENOUS

## 2014-08-22 MED ORDER — MIDAZOLAM HCL 5 MG/5ML IJ SOLN
INTRAMUSCULAR | Status: DC | PRN
  Administered 2014-08-22 (×2): 1 mg via INTRAVENOUS

## 2014-08-22 NOTE — Progress Notes (Signed)
REVIEWED.  

## 2014-08-22 NOTE — H&P (Signed)
Primary Care Physician:  Redge Gainer, MD Primary Gastroenterologist:  Dr. [name]  Pre-Procedure History & Physical: HPI:  Norma Barajas is a 73 y.o. female here for RECTAL BLEEDING.  Past Medical History  Diagnosis Date  . Essential hypertension, benign   . Mitral regurgitation     Mild 09/2012  . Erosive esophagitis   . GERD (gastroesophageal reflux disease)   . Hypercholesterolemia   . Coronary atherosclerosis of native coronary artery     Nonobstructive CAD 10/2005  . Paroxysmal atrial fibrillation   . Diastolic dysfunction     Grade 1. Ejection fraction 60-65%.  . Complication of anesthesia   . PONV (postoperative nausea and vomiting)   . Arthritis     Past Surgical History  Procedure Laterality Date  . Abdominal hysterectomy    . Prolapsed uterine fibroid ligation    . Cataract extraction    . Steel plate in the leg      broke tibia and fibula after falling down stairs  . Esophagogastroduodenoscopy  2001    Dr. [name]: erosive esophagitis, esophageal stricture, duodenitis, s/p Savary dilation  . Colonoscopy  2008    Dr. [name]: internal hemorrhoids   . Esophagogastroduodenoscopy (egd) with esophageal dilation  11/15/2012    YQM:VHQIONGEXB web was found & MOST LIKELY CAUSE FOR DYAPHAGIA/Polyp was found in the gastric body and gastric fundus/ gastritis on bx  . Bravo ph study  11/15/2012    Procedure: BRAVO Toast;  Surgeon: [name], MD;  [facility];  Service: Endoscopy;;    Prior to Admission medications   Medication Sig Start Date End Date Taking? Authorizing Provider  ALPRAZolam (XANAX) 0.25 MG tablet Take 1 tablet (0.25 mg total) by mouth 2 (two) times daily as needed for sleep or anxiety. For anxiety/nerves 06/05/14  Yes Sharion Balloon, FNP  amLODipine (NORVASC) 10 MG tablet Take 0.5 tablets (5 mg total) by mouth daily after supper. 03/04/14  Yes Mae Loree Fee, FNP  aspirin EC 81 MG tablet Take 81 mg by mouth daily as needed (when she feels  like she needs it).    Yes Historical Provider, MD  atenolol (TENORMIN) 50 MG tablet Take 0.5 tablets (25 mg total) by mouth daily. 03/04/14  Yes Mae Loree Fee, FNP  calcium citrate-vitamin D 200-200 MG-UNIT TABS Take 1 tablet by mouth every morning.    Yes Historical Provider, MD  cholecalciferol (VITAMIN D) 1000 UNITS tablet Take 1,000 Units by mouth daily.   Yes Historical Provider, MD  Multiple Vitamin (MULTIVITAMIN) capsule Take 1 capsule by mouth every morning.    Yes Historical Provider, MD  omeprazole (PRILOSEC) 20 MG capsule Take 1 capsule (20 mg total) by mouth 2 (two) times daily. 03/04/14  Yes Mae Loree Fee, FNP  simvastatin (ZOCOR) 40 MG tablet Take 1 tablet (40 mg total) by mouth every evening. 03/04/14  Yes Erby Pian, FNP    Allergies as of 08/13/2014 - Review Complete 08/13/2014  Allergen Reaction Noted  . Crestor [rosuvastatin calcium] Other (See Comments) 02/18/2011  . Iodine  02/18/2011  . Iohexol  11/03/2005  . Morphine    . Risedronate sodium Other (See Comments) 02/18/2011  . Triamterene-hctz Other (See Comments) 02/18/2011    Family History  Problem Relation Age of Onset  . Colon cancer Mother     Diagnosed at age 53  . Heart disease Mother   . Stroke Sister   . Cancer - Other Sister   . Stroke Sister  History   Social History  . Marital Status: Married    Spouse Name: N/A    Number of Children: N/A  . Years of Education: N/A   Occupational History  . Retired     Tourist information centre manager   Social History Main Topics  . Smoking status: Never Smoker   . Smokeless tobacco: Not on file     Comment: Never smoked  . Alcohol Use: No  . Drug Use: No  . Sexual Activity: Not on file   Other Topics Concern  . Not on file   Social History Narrative   Married   No regular exercise    Review of Systems: See HPI, otherwise negative ROS   Physical Exam: BP 169/89  Pulse 85  Temp(Src) 97.8 F (36.6 C) (Oral)  SpO2 96% General:   Alert,  pleasant  and cooperative in NAD Head:  Normocephalic and atraumatic. Neck:  Supple; Lungs:  Clear throughout to auscultation.    Heart:  Regular rate and rhythm. Abdomen:  Soft, nontender and nondistended. Normal bowel sounds, without guarding, and without rebound.   Neurologic:  Alert and  oriented x4;  grossly normal neurologically.  Impression/Plan:     RECTAL BLEEDING  PLAN:  1. Flex sig/? Hemorrhoid banding TODAY

## 2014-08-22 NOTE — Discharge Instructions (Signed)
You have hemorrhoids and I placed 3 bands to treat your rectal bleeding.     CALL [phone] IF YOU have RECTAL BLEEDING OR RECTAL PAIN GETS WORSE OR IF YOU HAVE FEVER, OR PROBLEMS URINATING.  FOLLOW A LOW RESIDUE DIET for 2 weeks.  FOLLOW UP IN 3-4 WEEKS.      ENDOSCOPY Care After Read the instructions outlined below and refer to this sheet in the next week. These discharge instructions provide you with general information on caring for yourself after you leave the hospital. While your treatment has been planned according to the most current medical practices available, unavoidable complications occasionally occur. If you have any problems or questions after discharge, call DR. Adelai Achey, [phone].  ACTIVITY  You may resume your regular activity, but move at a slower pace for the next 24 hours.   Take frequent rest periods for the next 24 hours.   Walking will help get rid of the air and reduce the bloated feeling in your belly (abdomen).   No driving for 24 hours (because of the medicine (anesthesia) used during the test).   You may shower.   Do not sign any important legal documents or operate any machinery for 24 hours (because of the anesthesia used during the test).    NUTRITION  Drink plenty of fluids.   You may resume your normal diet as instructed by your doctor.   Begin with a light meal and progress to your normal diet. Heavy or fried foods are harder to digest and may make you feel sick to your stomach (nauseated).   Avoid alcoholic beverages for 24 hours or as instructed.    MEDICATIONS  You may resume your normal medications.   WHAT YOU CAN EXPECT TODAY  Some feelings of bloating in the abdomen.   Passage of more gas than usual.   Spotting of blood in your stool or on the toilet paper  .  IF YOU HAD BIOPSIES TAKEN  DURING THE SIGMOIDOSCOPY/UPPER ENDOSCOPY:  Eat a soft diet IF YOU HAVE NAUSEA, BLOATING, ABDOMINAL PAIN, OR VOMITING.      FINDING OUT THE RESULTS OF YOUR TEST Not all test results are available during your visit. DR. [name] WILL CALL YOU WITHIN 7 DAYS OF YOUR PROCEDUE WITH YOUR RESULTS. Do not assume everything is normal if you have not heard from DR. Yasir Kitner IN ONE WEEK, CALL HER OFFICE AT [phone].  SEEK IMMEDIATE MEDICAL ATTENTION AND CALL THE OFFICE: [phone] IF:  You have more than a spotting of blood in your stool.   Your belly is swollen (abdominal distention).   You are nauseated or vomiting.   You have a temperature over 101F.   You have abdominal pain or discomfort that is severe or gets worse throughout the day.    HEMORRHOIDAL BANDING COMPLICATIONS:  COMMON: 1. MINOR PAIN  UNCOMMON: 1. ABSCESS 2. BAND FALLS OFF 3. PROLAPSE OF HEMORRHOIDS AND PAIN 4. ULCER BLEEDING  A. USUALLY SELF-LIMITED: MAY LAST 3-5 DAYS  B. MAY REQUIRE INTERVENTION: 1-2 WEEKS AFTER INTERACTIONS 5. NECROTIZING PELVIC SEPSIS  A. SYMPTOMS: FEVER, PAIN, DIFFICULTY URINATING   Low Fiber and Residue Restricted Diet A low fiber diet restricts foods that contain carbohydrates that are not digested in the small intestine. A diet containing about 10 g of fiber is considered low fiber. The diet needs to be individualized to suit patient tolerances and preferences and to avoid unnecessary restrictions. Generally, the foods emphasized in a low fiber diet have no skins or seeds. They  may have been processed to remove bran, germ, or husks. Cooking may not necessarily eliminate the fiber. Cooking may, in fact, enable a greater quantity of fiber to be consumed in a lesser volume. Legumes and nuts are also restricted. The term low residue has also been used to describe low fiber diets, although the two are not the same. Residue refers to any substance that adds to bowel (colonic) contents, such as sloughed cells and intestinal bacteria, in addition to fiber. Residue-containing foods, prunes and prune juice, milk, and connective  tissue from meats may also need to be eliminated. It is important to eliminate these foods during sudden (acute) attacks of inflammatory bowel disease, when there is a partial obstruction due to another reason, or when minimal fecal output is desired. When these problems are gone, a more normal diet may be used.  PURPOSE  Reduce stool weight and volume.   CHOOSING FOODS Check labels, especially on foods from the starch list. Often, dietary fiber content is listed with the Nutrition Facts panel.  Breads and Starches  Allowed: White, [place], and pita breads, plain rolls, buns, or sweet rolls, doughnuts, waffles, pancakes, bagels. Plain muffins, sweet breads, biscuits, matzoth. Flour. Soda, saltine, or graham crackers. Pretzels, rusks, melba toast, zwieback. Cooked cereals: cornmeal, farina, cream cereals. Dry cereals: refined corn, wheat, rice, and oat cereals (check label). Potatoes prepared any way without skins, refined macaroni, spaghetti, noodles, refined rice.   Avoid: Bread, rolls, or crackers made with whole-wheat, multigrains, rye, bran seeds, nuts, or coconut. Corn tortillas, table-shells. Corn chips, tortilla chips. Cereals containing whole-grains, multigrains, bran, coconut, nuts, or raisins. Cooked or dry oatmeal. Coarse wheat cereals, granola. Cereals advertised as "high fiber." Potato skins. Whole-grain pasta, wild or brown rice. Popcorn.  Vegetables  Allowed:  Strained tomato and vegetable juices. Fresh: tender lettuce, cucumber, cabbage, spinach, bean sprouts. Cooked, canned: asparagus, bean sprouts, cut green or wax beans, cauliflower, pumpkin, beets, mushrooms, olives, spinach, yellow squash, tomato, tomato sauce (no seeds), zucchini (peeled), turnips. Canned sweet potatoes. Small amounts of celery, onion, radish, and green pepper may be used. Keep servings limited to  cup.   Avoid: Fresh, cooked, or canned: artichokes, baked beans, beet greens, broccoli, Brussels sprouts,  French-style green beans, corn, kale, legumes, peas, sweet potatoes. Cooked: green or red cabbage, spinach. Avoid large servings of any vegetables.  Fruit  Allowed:  All fruit juices except prune juice. Cooked or canned: apricots applesauce, cantaloupe, cherries, grapefruit, grapes, kiwi, mandarin oranges, peaches, pears, fruit cocktail, pineapple, plums, watermelon. Fresh: banana, grapes, cantaloupe, avocado, cherries, pineapple, grapefruit, kiwi, nectarines, peaches, oranges, blueberries, plums. Keep servings limited to  cup or 1 piece.   Avoid: Fresh: apple with or without skin, apricots, mango, pears, raspberries, strawberries. Prune juice, stewed or dried prunes. Dried fruits, raisins, dates. Avoid large servings of all fresh fruits.  Meat and Meat Substitutes  Allowed:  Ground or well-cooked tender beef, ham, veal, lamb, pork, or poultry. Eggs, plain cheese. Fish, oysters, shrimp, lobster, other seafood. Liver, organ meats.   Avoid: Tough, fibrous meats with gristle. Peanut butter, smooth or chunky. Cheese with seeds, nuts, or other foods not allowed. Nuts, seeds, legumes, dried peas, beans, lentils.  Milk  Allowed:  All milk products except those not allowed. Milk and milk product consumption should be minimal when low residue is desired.   Avoid: Yogurt that contains nuts or seeds.  Soups and Combination Foods  Allowed:  Bouillon, broth, or cream soups made from allowed foods. Any strained soup. Casseroles  or mixed dishes made with allowed foods.   Avoid: Soups made from vegetables that are not allowed or that contain other foods not allowed.  Desserts and Sweets  Allowed:  Plain cakes and cookies, pie made with allowed fruit, pudding, custard, cream pie. Gelatin, fruit, ice, sherbet, frozen ice pops. Ice cream, ice milk without nuts. Plain hard candy, honey, jelly, molasses, syrup, sugar, chocolate syrup, gumdrops, marshmallows.   Avoid: Desserts, cookies, or candies that contain  nuts, peanut butter, or dried fruits. Jams, preserves with seeds, marmalade.  Fats and Oils  Allowed:  Margarine, butter, cream, mayonnaise, salad oils, plain salad dressings made from allowed foods. Plain gravy, crisp bacon without rind.   Avoid: Seeds, nuts, olives. Avocados.  Beverages  Allowed:  All, except those listed to avoid.   Avoid: Fruit juices with high pulp, prune juice.  Condiments  Allowed:  Ketchup, mustard, horseradish, vinegar, cream sauce, cheese sauce, cocoa powder. Spices in moderation: allspice, basil, bay leaves, celery powder or leaves, cinnamon, cumin powder, curry powder, ginger, mace, marjoram, onion or garlic powder, oregano, paprika, parsley flakes, ground pepper, rosemary, sage, savory, tarragon, thyme, turmeric.   Avoid: Coconut, pickles.  SAMPLE MEAL PLAN The following menu is provided as a sample. Your daily menu plans will vary. Be sure to include a minimum of the following each day in order to provide essential nutrients for the adult:  Starch/Bread/Cereal Group, 6 servings.   Fruit/Vegetable Group, 5 servings.   Meat/Meat Substitute Group, 2 servings.   Milk/Milk Substitute Group, 2 servings.  A serving is equal to  cup for fruits, vegetables, and cooked cereals or 1 piece for foods such as a piece of bread, 1 orange, or 1 apple. For dry cereals and crackers, use serving sizes listed on the label. Combination foods may count as full or partial servings from various food groups. Fats, desserts, and sweets may be added to the meal plan after the requirements for essential nutrients are met. SAMPLE MENU Breakfast   cup orange juice.   1 boiled egg.   1 slice white toast.   Margarine.    cup cornflakes.   1 cup milk.   Beverage.  Lunch   cup chicken noodle soup.   2 to 3 oz sliced roast beef.   2 slices seedless rye bread.   Mayonnaise.    cup tomato juice.   1 small banana.   Beverage.  Dinner  3 oz baked chicken.     cup scalloped potatoes.    cup cooked beets.   White dinner roll.   Margarine.    cup canned peaches.   Beverage.

## 2014-08-25 ENCOUNTER — Telehealth: Payer: Self-pay | Admitting: Gastroenterology

## 2014-08-25 NOTE — Telephone Encounter (Signed)
OPV E30 in 4 weeks, dx: hemorrhoid banding follow up

## 2014-08-25 NOTE — Op Note (Signed)
[place], [id number]   FLEX SIGMOIDOSCOPY PROCEDURE REPORT  PATIENT: Norma Barajas, Norma Barajas  MR#: [id number] BIRTHDATE: March 01, 1941 , 73  yrs. old GENDER: female ENDOSCOPIST: Barney Drain, MD REFERRED AV:[id number] Laurance Flatten, M.D. , DR. Floreen Comber BUIST PROCEDURE DATE:  08/22/2014 PROCEDURE:   Hemorrhoidectomy via banding(3) and Sigmoidoscopy, diagnostic INDICATIONS:hematochezia. MEDICATIONS: Demerol 25 mg IV and Versed 2 mg IV  DESCRIPTION OF PROCEDURE:    Physical exam was performed.  Informed consent was obtained from the patient after explaining the benefits, risks, and alternatives to procedure.  The patient was connected to monitor and placed in left lateral position. Continuous oxygen was provided by nasal cannula and IV medicine administered through an indwelling cannula.  After administration of sedation and rectal exam, the patients rectum was intubated and the EG-[id number] ([id number])  colonoscope was advanced under direct visualization to the [place]. The scope was removed slowly by carefully examining the color, texture, anatomy, and integrity mucosa on the way out.  The patient was recovered in endoscopy and discharged home in satisfactory condition.     COLON FINDINGS: There was mild diverticulosis noted in the sigmoid colon with associated muscular hypertrophy and Moderate sized Grade III hemorrhoids were found.    3 BANDS PLACED.  PREP QUALITY: The overall prep quality was adequate.  COMPLICATIONS: RECTAL PAIN INPOSTOP. BANDS ADJUSTED. MILD RECTAL PRESSURE ON DISCHARGE.  ENDOSCOPIC IMPRESSION: 1.   Mild diverticulosis  in the sigmoid colon 2.   Moderate sized Grade III hemorrhoids  RECOMMENDATIONS: CALL [phone] for RECTAL BLEEDING OR RECTAL PAIN GETS WORSE OR IF YOU HAVE FEVER, OR PROBLEMS URINATING. FOLLOW A LOW RESIDUE DIET for 2 weeks. FOLLOW UP IN 4 WEEKS.      _______________________________ eSignedBarney Drain, MD  2014-09-03 10:52 AM   CPT CODES: ICD CODES:  The ICD and CPT codes recommended by this software are interpretations from the data that the clinical staff has captured with the software.  The verification of the translation of this report to the ICD and CPT codes and modifiers is the sole responsibility of the health care institution and practicing physician where this report was generated.  [employer] will not be held responsible for the validity of the ICD and CPT codes included on this report.  AMA assumes no liability for data contained or not contained herein. CPT is a Designer, television/film set of the [employer].

## 2014-08-26 ENCOUNTER — Encounter (HOSPITAL_COMMUNITY): Payer: Self-pay | Admitting: Gastroenterology

## 2014-08-26 NOTE — Telephone Encounter (Signed)
appt made

## 2014-09-09 ENCOUNTER — Ambulatory Visit: Payer: Medicare HMO | Admitting: Family Medicine

## 2014-09-24 ENCOUNTER — Telehealth: Payer: Self-pay | Admitting: Family Medicine

## 2014-09-24 ENCOUNTER — Encounter: Payer: Self-pay | Admitting: Family Medicine

## 2014-09-24 ENCOUNTER — Ambulatory Visit (INDEPENDENT_AMBULATORY_CARE_PROVIDER_SITE_OTHER): Payer: Medicare HMO | Admitting: Family Medicine

## 2014-09-24 VITALS — BP 126/72 | HR 77 | Temp 98.1°F | Ht 62.0 in | Wt 137.0 lb

## 2014-09-24 DIAGNOSIS — H6981 Other specified disorders of Eustachian tube, right ear: Secondary | ICD-10-CM

## 2014-09-24 MED ORDER — FLUTICASONE PROPIONATE 50 MCG/ACT NA SUSP: 2.0000 | Freq: Every day | NASAL | Status: DC

## 2014-09-24 NOTE — Telephone Encounter (Signed)
Made pt appt at 6pm with [facility] 09/24/14.  rs

## 2014-09-24 NOTE — Progress Notes (Signed)
   Subjective:    Patient ID: Norma Barajas, female    DOB: 1941/03/16, 73 y.o.   MRN: [id number]  HPI Patient c/o ear discomfort bilateral.  Review of Systems  Constitutional: Negative for fever.  HENT: Negative for ear pain.   Eyes: Negative for discharge.  Respiratory: Negative for cough.   Cardiovascular: Negative for chest pain.  Gastrointestinal: Negative for abdominal distention.  Endocrine: Negative for polyuria.  Genitourinary: Negative for difficulty urinating.  Musculoskeletal: Negative for gait problem and neck pain.  Skin: Negative for color change and rash.  Neurological: Negative for speech difficulty and headaches.  Psychiatric/Behavioral: Negative for agitation.       Objective:    BP 126/72 mmHg  Pulse 77  Temp(Src) 98.1 F (36.7 C) (Oral)  Ht 5\' 2"  (1.575 m)  Wt 137 lb (62.143 kg)  BMI 25.05 kg/m2 Physical Exam  Constitutional: She is oriented to person, place, and time. She appears well-developed and well-nourished.  HENT:  Head: Normocephalic and atraumatic.  Mouth/Throat: Oropharynx is clear and moist.  Eyes: Pupils are equal, round, and reactive to light.  Neck: Normal range of motion. Neck supple.  Cardiovascular: Normal rate and regular rhythm.   No murmur heard. Pulmonary/Chest: Effort normal and breath sounds normal.  Abdominal: Soft. Bowel sounds are normal. There is no tenderness.  Neurological: She is alert and oriented to person, place, and time.  Skin: Skin is warm and dry.  Psychiatric: She has a normal mood and affect.          Assessment & Plan:     ICD-9-CM ICD-10-CM   1. ETD (eustachian tube dysfunction), right 381.81 H69.81 fluticasone (FLONASE) 50 MCG/ACT nasal spray     No Follow-up on file.  Lysbeth Penner FNP

## 2014-09-26 ENCOUNTER — Encounter: Payer: Self-pay | Admitting: Gastroenterology

## 2014-09-26 ENCOUNTER — Ambulatory Visit (INDEPENDENT_AMBULATORY_CARE_PROVIDER_SITE_OTHER): Payer: Commercial Managed Care - HMO | Admitting: Gastroenterology

## 2014-09-26 VITALS — BP 143/89 | HR 76 | Temp 97.3°F | Ht 62.0 in | Wt 136.0 lb

## 2014-09-26 DIAGNOSIS — R1310 Dysphagia, unspecified: Secondary | ICD-10-CM

## 2014-09-26 DIAGNOSIS — K219 Gastro-esophageal reflux disease without esophagitis: Secondary | ICD-10-CM

## 2014-09-26 DIAGNOSIS — K648 Other hemorrhoids: Secondary | ICD-10-CM

## 2014-09-26 NOTE — Assessment & Plan Note (Signed)
SX CONTROLLED.  CONTINUE TO MONITOR SYMPTOMS.

## 2014-09-26 NOTE — Patient Instructions (Signed)
PLEASE CALL WITH QUESTIONS OR CONCERNS.  YOU MAY FOLLOW UP WITH ME AS NEEDED.

## 2014-09-26 NOTE — Assessment & Plan Note (Signed)
SX RESOLVED.  CONTINUE TO MONITOR SYMPTOMS. PLEASE CALL WITH QUESTIONS OR CONCERNS.

## 2014-09-26 NOTE — Progress Notes (Signed)
Subjective:    Patient ID: Norma Barajas, female    DOB: October 12, 1941, 73 y.o.   MRN: [id number]  Redge Gainer, MD  HPI NO BRBPR OR MELENA. NO TROUBLE AFTER HEMORRHOID BANDING. HAD SURGERY BY DR. Evie Lacks W/O PROBLEMS. BMS: DAILY. NO PROBLEMS SWALLOWING OR WITH HEARTBURN. WEIGHT GOOD.  Past Medical History  Diagnosis Date  . Essential hypertension, benign   . Mitral regurgitation     Mild 09/2012  . Erosive esophagitis   . GERD (gastroesophageal reflux disease)   . Hypercholesterolemia   . Coronary atherosclerosis of native coronary artery     Nonobstructive CAD 10/2005  . Paroxysmal atrial fibrillation   . Diastolic dysfunction     Grade 1. Ejection fraction 60-65%.  . Complication of anesthesia   . PONV (postoperative nausea and vomiting)   . Arthritis    Past Surgical History  Procedure Laterality Date  . Abdominal hysterectomy    . Prolapsed uterine fibroid ligation    . Cataract extraction    . Steel plate in the leg      broke tibia and fibula after falling down stairs  . Esophagogastroduodenoscopy  2001    Dr. [name]: erosive esophagitis, esophageal stricture, duodenitis, s/p Savary dilation  . Colonoscopy  2008    Dr. [name]: internal hemorrhoids   . Esophagogastroduodenoscopy (egd) with esophageal dilation  11/15/2012    [facility]:XNATFTDDUK web was found & MOST LIKELY CAUSE FOR DYAPHAGIA/Polyp was found in the gastric body and gastric fundus/ gastritis on bx  . Bravo ph study  11/15/2012    Procedure: BRAVO Crest STUDY;  Surgeon: [name], MD;  [facility]: [facility];  Service: Endoscopy;;  . Flexible sigmoidoscopy N/A 08/22/2014    Procedure: FLEXIBLE SIGMOIDOSCOPY;  Surgeon: [name], MD;  [facility];  Service: Endoscopy;  Laterality: N/A;  830  . Hemorrhoid banding N/A 08/22/2014    Procedure: HEMORRHOID BANDING;  Surgeon: [name], MD;  [facility]: [facility];  Service: Endoscopy;  Laterality: N/A;    Allergies  Allergen Reactions  .  Crestor [Rosuvastatin Calcium] Other (See Comments)    weakness  . Iodine     ivp dye  . Iohexol   . Morphine   . Risedronate Sodium Other (See Comments)    ACTONEL REACTION: reflux  . Triamterene-Hctz Other (See Comments)    weakness   Current Outpatient Prescriptions  Medication Sig Dispense Refill  . ALPRAZolam (XANAX) 0.25 MG tablet Take 1 tablet (0.25 mg total) by mouth 2 (two) times daily as needed for sleep or anxiety. For anxiety/nerves    . amLODipine (NORVASC) 10 MG tablet Take 0.5 tablets (5 mg total) by mouth daily after supper.    Marland Kitchen aspirin EC 81 MG tablet Take 81 mg by mouth daily as needed (when she feels like she needs it).     Marland Kitchen atenolol (TENORMIN) 50 MG tablet Take 0.5 tablets (25 mg total) by mouth daily.    . calcium citrate-vitamin D 200-200 MG-UNIT TABS Take 1 tablet by mouth every morning.     . cholecalciferol (VITAMIN D) 1000 UNITS tablet Take 1,000 Units by mouth daily.    . fluticasone (FLONASE) 50 MCG/ACT nasal spray Place 2 sprays into both nostrils daily.    . Multiple Vitamin (MULTIVITAMIN) capsule Take 1 capsule by mouth every morning.     Marland Kitchen omeprazole (PRILOSEC) 20 MG capsule Take 1 capsule (20 mg total) by mouth 2 (two) times daily.    . simvastatin (  ZOCOR) 40 MG tablet Take 1 tablet (40 mg total) by mouth every evening.      Review of Systems     Objective:   Physical Exam  Constitutional: She is oriented to person, place, and time. She appears well-developed and well-nourished. No distress.  HENT:  Head: Normocephalic and atraumatic.  Mouth/Throat: Oropharynx is clear and moist. No oropharyngeal exudate.  Eyes: Pupils are equal, round, and reactive to light. No scleral icterus.  Neck: Normal range of motion. Neck supple.  Cardiovascular: Normal rate, regular rhythm and normal heart sounds.   Pulmonary/Chest: Effort normal and breath sounds normal. No respiratory distress.  Abdominal: Soft. Bowel sounds are normal. She exhibits no distension.  There is no tenderness.  Musculoskeletal: She exhibits no edema.  Lymphadenopathy:    She has no cervical adenopathy.  Neurological: She is alert and oriented to person, place, and time.  Psychiatric: She has a normal mood and affect.  Vitals reviewed.         Assessment & Plan:

## 2014-09-26 NOTE — Progress Notes (Signed)
cc'ed to pcp °

## 2014-10-01 ENCOUNTER — Telehealth: Payer: Self-pay

## 2014-10-01 MED ORDER — HYDROCORTISONE 2.5 % RE CREA: 1.0000 "application " | TOPICAL_CREAM | Freq: Two times a day (BID) | RECTAL | Status: DC

## 2014-10-01 NOTE — Telephone Encounter (Signed)
Pt is aware and is not having any rectal pain or discomfort.

## 2014-10-01 NOTE — Telephone Encounter (Signed)
She may see a little bit of spotting if straining with bowel movement. Avoid constipation. Any rectal pain or discomfort? Trial of Anusol cream BID X 7 days. Miralax daily as needed for constipation.

## 2014-10-01 NOTE — Telephone Encounter (Signed)
Pt called with concerns of rectal bleeding. She had a banding on 09/26/2014 and had been doing well. She is constipated some. Had BM this morning and had bright red blood on tissue. She has  Urinated since and had a little bright red blood from the rectum. Please advise!

## 2014-10-21 ENCOUNTER — Other Ambulatory Visit: Payer: Self-pay | Admitting: General Practice

## 2014-10-22 ENCOUNTER — Telehealth: Payer: Self-pay | Admitting: *Deleted

## 2014-10-22 NOTE — Telephone Encounter (Signed)
Xanax called in 

## 2014-10-22 NOTE — Telephone Encounter (Signed)
RX called into [facility] Pt notified

## 2014-10-22 NOTE — Telephone Encounter (Signed)
Last seen 09/24/14  B [place]  If approved route to nurse to call into [facility]

## 2014-10-22 NOTE — Telephone Encounter (Signed)
Please review and advise.

## 2014-11-26 ENCOUNTER — Other Ambulatory Visit (INDEPENDENT_AMBULATORY_CARE_PROVIDER_SITE_OTHER): Payer: Medicare HMO

## 2014-11-26 DIAGNOSIS — E785 Hyperlipidemia, unspecified: Secondary | ICD-10-CM | POA: Diagnosis not present

## 2014-11-26 DIAGNOSIS — E559 Vitamin D deficiency, unspecified: Secondary | ICD-10-CM | POA: Diagnosis not present

## 2014-11-26 DIAGNOSIS — I4891 Unspecified atrial fibrillation: Secondary | ICD-10-CM

## 2014-11-26 DIAGNOSIS — I10 Essential (primary) hypertension: Secondary | ICD-10-CM

## 2014-11-26 NOTE — Addendum Note (Signed)
Addended by: Earlene Plater on: 11/26/2014 09:26 AM   Modules accepted: Orders

## 2014-11-26 NOTE — Progress Notes (Signed)
LAB ONLY 

## 2014-11-26 NOTE — Progress Notes (Signed)
Lab only 

## 2014-11-27 ENCOUNTER — Other Ambulatory Visit: Payer: Self-pay | Admitting: Family

## 2014-11-27 ENCOUNTER — Encounter: Payer: Self-pay | Admitting: Family

## 2014-11-27 ENCOUNTER — Ambulatory Visit (INDEPENDENT_AMBULATORY_CARE_PROVIDER_SITE_OTHER): Payer: Medicare HMO | Admitting: Family

## 2014-11-27 VITALS — BP 168/86 | HR 67 | Temp 96.9°F | Ht 62.0 in | Wt 139.8 lb

## 2014-11-27 DIAGNOSIS — E559 Vitamin D deficiency, unspecified: Secondary | ICD-10-CM | POA: Insufficient documentation

## 2014-11-27 DIAGNOSIS — M810 Age-related osteoporosis without current pathological fracture: Secondary | ICD-10-CM

## 2014-11-27 DIAGNOSIS — F411 Generalized anxiety disorder: Secondary | ICD-10-CM

## 2014-11-27 DIAGNOSIS — I25119 Atherosclerotic heart disease of native coronary artery with unspecified angina pectoris: Secondary | ICD-10-CM

## 2014-11-27 DIAGNOSIS — K219 Gastro-esophageal reflux disease without esophagitis: Secondary | ICD-10-CM

## 2014-11-27 DIAGNOSIS — E785 Hyperlipidemia, unspecified: Secondary | ICD-10-CM

## 2014-11-27 DIAGNOSIS — I10 Essential (primary) hypertension: Secondary | ICD-10-CM

## 2014-11-27 DIAGNOSIS — I480 Paroxysmal atrial fibrillation: Secondary | ICD-10-CM

## 2014-11-27 LAB — CMP14+EGFR
A/G RATIO: 1.5 (ref 1.1–2.5)
ALT: 15 IU/L (ref 0–32)
AST: 21 IU/L (ref 0–40)
Albumin: 4.4 g/dL (ref 3.5–4.8)
Alkaline Phosphatase: 87 IU/L (ref 39–117)
BILIRUBIN TOTAL: 0.3 mg/dL (ref 0.0–1.2)
BUN/Creatinine Ratio: 12 (ref 11–26)
BUN: 10 mg/dL (ref 8–27)
CHLORIDE: 101 mmol/L (ref 97–108)
CO2: 28 mmol/L (ref 18–29)
Calcium: 9.2 mg/dL (ref 8.7–10.3)
Creatinine, Ser: 0.85 mg/dL (ref 0.57–1.00)
GFR, EST AFRICAN AMERICAN: 78 mL/min/{1.73_m2} (ref >59)
GFR, EST NON AFRICAN AMERICAN: 67 mL/min/{1.73_m2} (ref >59)
Globulin, Total: 3 g/dL (ref 1.5–4.5)
Glucose: 102 mg/dL — ABNORMAL HIGH (ref 65–99)
Potassium: 3.9 mmol/L (ref 3.5–5.2)
SODIUM: 144 mmol/L (ref 134–144)
Total Protein: 7.4 g/dL (ref 6.0–8.5)

## 2014-11-27 LAB — LIPID PANEL
CHOLESTEROL TOTAL: 211 mg/dL — AB (ref 100–199)
Chol/HDL Ratio: 4.7 ratio units — ABNORMAL HIGH (ref 0.0–4.4)
HDL: 45 mg/dL (ref >39)
LDL CALC: 117 mg/dL — AB (ref 0–99)
Triglycerides: 246 mg/dL — ABNORMAL HIGH (ref 0–149)
VLDL Cholesterol Cal: 49 mg/dL — ABNORMAL HIGH (ref 5–40)

## 2014-11-27 LAB — VITAMIN D 25 HYDROXY (VIT D DEFICIENCY, FRACTURES): VIT D 25 HYDROXY: 45.6 ng/mL (ref 30.0–100.0)

## 2014-11-27 MED ORDER — ATENOLOL 50 MG PO TABS: 25.0000 mg | ORAL_TABLET | Freq: Every day | ORAL | Status: DC

## 2014-11-27 MED ORDER — OMEPRAZOLE 20 MG PO CPDR: 20.0000 mg | DELAYED_RELEASE_CAPSULE | Freq: Two times a day (BID) | ORAL | Status: DC

## 2014-11-27 MED ORDER — ALPRAZOLAM 0.25 MG PO TABS: ORAL_TABLET | ORAL | Status: DC

## 2014-11-27 MED ORDER — AMLODIPINE BESYLATE 10 MG PO TABS: 5.0000 mg | ORAL_TABLET | Freq: Every day | ORAL | Status: DC

## 2014-11-27 MED ORDER — SIMVASTATIN 40 MG PO TABS: 40.0000 mg | ORAL_TABLET | Freq: Every evening | ORAL | Status: DC

## 2014-11-27 NOTE — Progress Notes (Signed)
Subjective:    Patient ID: Norma Barajas, female    DOB: [birth year], 74 y.o.   MRN: [id number]  Hypertension This is a chronic problem. The current episode started more than 1 year ago. The problem has been waxing and waning since onset. The problem is uncontrolled. Associated symptoms include anxiety. Pertinent negatives include no headaches, palpitations, peripheral edema or shortness of breath. Risk factors for coronary artery disease include dyslipidemia, family history and post-menopausal state. Past treatments include beta blockers and calcium channel blockers. The current treatment provides significant improvement. There is no history of kidney disease, CAD/MI, CVA, heart failure or a thyroid problem. There is no history of sleep apnea.  Hyperlipidemia This is a chronic problem. The current episode started more than 1 year ago. The problem is controlled. Recent lipid tests were reviewed and are normal. She has no history of diabetes or hypothyroidism. Pertinent negatives include no focal sensory loss, leg pain, myalgias or shortness of breath. Current antihyperlipidemic treatment includes statins. The current treatment provides moderate improvement of lipids. Risk factors for coronary artery disease include dyslipidemia, hypertension, post-menopausal, a sedentary lifestyle and family history.  Gastrophageal Reflux She reports no belching, no coughing, no heartburn, no sore throat or no tooth decay. This is a chronic problem. The current episode started more than 1 year ago. The problem occurs rarely. The problem has been resolved. The symptoms are aggravated by certain foods. Pertinent negatives include no fatigue or muscle weakness. She has tried a PPI for the symptoms. The treatment provided significant relief.  Anxiety Presents for follow-up visit. Symptoms include excessive worry and nervous/anxious behavior. Patient reports no depressed mood, hyperventilation, irritability, palpitations,  restlessness or shortness of breath. Symptoms occur occasionally. The severity of symptoms is mild. The symptoms are aggravated by family issues.   Her past medical history is significant for anxiety/panic attacks and arrhythmia. There is no history of depression. Past treatments include benzodiazephines.   *Pt states she takes her BP at home and it 130's/70's. Pt states she is feeling very anxious today. States she does not know why. States she does not want to change blood pressure medications at this time.    Review of Systems  Constitutional: Negative.  Negative for irritability and fatigue.  HENT: Negative.  Negative for sore throat.   Eyes: Negative.   Respiratory: Negative.  Negative for cough and shortness of breath.   Cardiovascular: Negative.  Negative for palpitations.  Gastrointestinal: Negative.  Negative for heartburn.  Endocrine: Negative.   Genitourinary: Negative.   Musculoskeletal: Negative.  Negative for myalgias and muscle weakness.  Neurological: Negative.  Negative for headaches.  Hematological: Negative.   Psychiatric/Behavioral: The patient is nervous/anxious.   All other systems reviewed and are negative.      Objective:   Physical Exam  Constitutional: She is oriented to person, place, and time. She appears well-developed and well-nourished. No distress.  HENT:  Head: Normocephalic and atraumatic.  Right Ear: External ear normal.  Mouth/Throat: Oropharynx is clear and moist.  Eyes: Pupils are equal, round, and reactive to light.  Neck: Normal range of motion. Neck supple. No thyromegaly present.  Cardiovascular: Normal rate, regular rhythm and intact distal pulses.   Murmur heard. Pulmonary/Chest: Effort normal and breath sounds normal. No respiratory distress. She has no wheezes.  Abdominal: Soft. Bowel sounds are normal. She exhibits no distension. There is no tenderness.  Musculoskeletal: Normal range of motion. She exhibits no edema or tenderness.    Neurological: She is alert and  oriented to person, place, and time. She has normal reflexes. No cranial nerve deficit.  Skin: Skin is warm and dry.  Psychiatric: She has a normal mood and affect. Her behavior is normal. Judgment and thought content normal.  Vitals reviewed.   BP 168/86 mmHg  Pulse 67  Temp(Src) 96.9 F (36.1 C) (Oral)  Ht _0  (1.575 m)  Wt 139 lb 12.8 oz (63.413 kg)  BMI 25.56 kg/m2       Assessment & Plan:  1. Essential hypertension - amLODipine (NORVASC) 10 MG tablet; Take 0.5 tablets (5 mg total) by mouth daily after supper.  Dispense: 90 tablet; Refill: 4 - atenolol (TENORMIN) 50 MG tablet; Take 0.5 tablets (25 mg total) by mouth daily.  Dispense: 90 tablet; Refill: 4 - CMP14+EGFR  2. Gastroesophageal reflux disease, esophagitis presence not specified - omeprazole (PRILOSEC) 20 MG capsule; Take 1 capsule (20 mg total) by mouth 2 (two) times daily.  Dispense: 180 capsule; Refill: 4 - CMP14+EGFR  3. Hyperlipidemia - simvastatin (ZOCOR) 40 MG tablet; Take 1 tablet (40 mg total) by mouth every evening.  Dispense: 90 tablet; Refill: 4 - CMP14+EGFR - Lipid panel  4. Osteoporosis - CMP14+EGFR - DG Bone Density; Future  5. GAD (generalized anxiety disorder) - ALPRAZolam (XANAX) 0.25 MG tablet; TAKE 1 TABLET BY MOUTH TWICE A DAY AS NEEDED FOR ANXIETY, NERVES, OR SLEEP  Dispense: 60 tablet; Refill: 1 - CMP14+EGFR  6. Vitamin D deficiency - CMP14+EGFR - Vit D  25 hydroxy (rtn osteoporosis monitoring)  7. PAF (paroxysmal atrial fibrillation) - CMP14+EGFR  8. Atherosclerosis of native coronary artery of native heart with angina pectoris - CMP14+EGFR   Continue all meds Labs pending Health Maintenance reviewed Diet and exercise encouraged RTO 3 months  Evelina Dun, FNP

## 2014-11-27 NOTE — Addendum Note (Signed)
Addended by: Pollyann Kennedy F on: 11/27/2014 04:04 PM   Modules accepted: Orders

## 2014-11-27 NOTE — Patient Instructions (Signed)

## 2015-01-06 ENCOUNTER — Telehealth: Payer: Self-pay | Admitting: Family

## 2015-01-29 ENCOUNTER — Encounter: Payer: Self-pay | Admitting: Pharmacist

## 2015-01-29 ENCOUNTER — Ambulatory Visit (INDEPENDENT_AMBULATORY_CARE_PROVIDER_SITE_OTHER): Payer: Commercial Managed Care - HMO

## 2015-01-29 ENCOUNTER — Ambulatory Visit (INDEPENDENT_AMBULATORY_CARE_PROVIDER_SITE_OTHER): Payer: Commercial Managed Care - HMO | Admitting: Pharmacist

## 2015-01-29 VITALS — Ht 61.0 in | Wt 139.0 lb

## 2015-01-29 DIAGNOSIS — M810 Age-related osteoporosis without current pathological fracture: Secondary | ICD-10-CM | POA: Diagnosis not present

## 2015-01-29 MED ORDER — ALENDRONATE SODIUM 70 MG PO TABS: 70.0000 mg | ORAL_TABLET | ORAL | Status: DC

## 2015-01-29 NOTE — Progress Notes (Signed)
Patient ID: Norma Barajas, female   DOB: 1941-08-23, 74 y.o.   MRN: [id number]   [facility] Current Height: Height: 5\' 1"  (154.9 cm)      Max Lifetime Height:  5\' 2"  Current Weight: Weight: 139 lb (63.05 kg)       Ethnicity:Caucasian    HPI: Patient with ostoeporosis  Back Pain?  No       Kyphosis?  Yes Prior fracture?  Yes - right leg 2013 Med(s) for Osteoporosis/Osteopenia:  none Med(s) previously tried for Osteoporosis/Osteopenia:  Fosamax - patient states she stopped because was too expensive (about 10 years ago)  Couldn't take actonel - worsened GERD                                                           PMH: Age at menopause:  13's Hysterectomy?  Yes Oophorectomy?  Yes HRT? Yes - Former.  Type/duration: premarin Steroid Use?  No Thyroid med?  No History of cancer?  No History of digestive disorders (ie Crohn's)?  Yes - GERD on chronic PPI Current or previous eating disorders?  No Last Vitamin D Result:  45.6 (11/26/2014) Last GFR Result:  67 (11/2014)   FH/SH: Family history of osteoporosis?  No Parent with history of hip fracture?  Yes - mother Family history of breast cancer?  No Exercise?  Yes - walking Smoking?  No Alcohol?  No    Calcium Assessment Calcium Intake  # of servings/day  Calcium mg  Milk (8 oz) 2  x  300  = 600mg   Yogurt (4 oz) 0 x  200 = 0  Cheese (1 oz) 0 x  200 = 0  Other Calcium sources   250mg   Ca supplement Tums and MVI = 600mg    Estimated calcium intake per day 1450mg     DEXA Results Date of Test T-Score for AP Spine L1-L4 T-Score for Total Left Hip T-Score for Total Right Hip  01/29/2015 -3.7 -3.3 -3.5  04/12/2012 -3.4 -3.5 -3.3  03/22/2005 -3.1 -2.8 --  03/18/2003 -3.7 -2.9 --   Assessment: osteoporosis  Recommendations: 1.  Start  alendronate (FOSAMAX) 70mg  take 1 tablet weekly - discussed adminstration - emptystomach, full glass of water and not food for 30 minutes. 2.  recommend calcium 1200mg  daily through  supplementation or diet.  3.  recommend weight bearing exercise - 30 minutes at least 4 days per week.   4.  Counseled and educated about fall risk and prevention.  Recheck DEXA:  2 years  Time spent counseling patient:  30 minutes   Cherre Robins, PharmD, CPP

## 2015-01-29 NOTE — Patient Instructions (Signed)
Fall Prevention and Home Safety Falls cause injuries and can affect all age groups. It is possible to use preventive measures to significantly decrease the likelihood of falls. There are many simple measures which can make your home safer and prevent falls. OUTDOORS  Repair cracks and edges of walkways and driveways.  Remove high doorway thresholds.  Trim shrubbery on the main path into your home.  Have good outside lighting.  Clear walkways of tools, rocks, debris, and clutter.  Check that handrails are not broken and are securely fastened. Both sides of steps should have handrails.  Have leaves, snow, and ice cleared regularly.  Use sand or salt on walkways during winter months.  In the garage, clean up grease or oil spills. BATHROOM  Install night lights.  Install grab bars by the toilet and in the tub and shower.  Use non-skid mats or decals in the tub or shower.  Place a plastic non-slip stool in the shower to sit on, if needed.  Keep floors dry and clean up all water on the floor immediately.  Remove soap buildup in the tub or shower on a regular basis.  Secure bath mats with non-slip, double-sided rug tape.  Remove throw rugs and tripping hazards from the floors. BEDROOMS  Install night lights.  Make sure a bedside light is easy to reach.  Do not use oversized bedding.  Keep a telephone by your bedside.  Have a firm chair with side arms to use for getting dressed.  Remove throw rugs and tripping hazards from the floor. KITCHEN  Keep handles on pots and pans turned toward the center of the stove. Use back burners when possible.  Clean up spills quickly and allow time for drying.  Avoid walking on wet floors.  Avoid hot utensils and knives.  Position shelves so they are not too high or low.  Place commonly used objects within easy reach.  If necessary, use a sturdy step stool with a grab bar when reaching.  Keep electrical cables out of the  way.  Do not use floor polish or wax that makes floors slippery. If you must use wax, use non-skid floor wax.  Remove throw rugs and tripping hazards from the floor. STAIRWAYS  Never leave objects on stairs.  Place handrails on both sides of stairways and use them. Fix any loose handrails. Make sure handrails on both sides of the stairways are as long as the stairs.  Check carpeting to make sure it is firmly attached along stairs. Make repairs to worn or loose carpet promptly.  Avoid placing throw rugs at the top or bottom of stairways, or properly secure the rug with carpet tape to prevent slippage. Get rid of throw rugs, if possible.  Have an electrician put in a light switch at the top and bottom of the stairs. OTHER FALL PREVENTION TIPS  Wear low-heel or rubber-soled shoes that are supportive and fit well. Wear closed toe shoes.  When using a stepladder, make sure it is fully opened and both spreaders are firmly locked. Do not climb a closed stepladder.  Add color or contrast paint or tape to grab bars and handrails in your home. Place contrasting color strips on first and last steps.  Learn and use mobility aids as needed. Install an electrical emergency response system.  Turn on lights to avoid dark areas. Replace light bulbs that burn out immediately. Get light switches that glow.  Arrange furniture to create clear pathways. Keep furniture in the same place.    Firmly attach carpet with non-skid or double-sided tape.  Eliminate uneven floor surfaces.  Select a carpet pattern that does not visually hide the edge of steps.  Be aware of all pets. OTHER HOME SAFETY TIPS  Set the water temperature for 120 F (48.8 C).  Keep emergency numbers on or near the telephone.  Keep smoke detectors on every level of the home and near sleeping areas. Document Released: 10/15/2002 Document Revised: 04/25/2012 Document Reviewed: 01/14/2012 ExitCare Patient Information 2015  [employer]. This information is not intended to replace advice given to you by your health care provider. Make sure you discuss any questions you have with your health care provider.                Exercise for Strong Bones  Exercise is important to build and maintain strong bones / bone density.  There are 2 types of exercises that are important to building and maintaining strong bones:  Weight- bearing and muscle-stregthening.  Weight-bearing Exercises  These exercises include activities that make you move against gravity while staying upright. Weight-bearing exercises can be high-impact or low-impact.  High-impact weight-bearing exercises help build bones and keep them strong. If you have broken a bone due to osteoporosis or are at risk of breaking a bone, you may need to avoid high-impact exercises. If you're not sure, you should check with your healthcare provider.  Examples of high-impact weight-bearing exercises are: Dancing  Doing high-impact aerobics  Hiking  Jogging/running  Jumping Rope  Stair climbing  Tennis  Low-impact weight-bearing exercises can also help keep bones strong and are a safe alternative if you cannot do high-impact exercises.   Examples of low-impact weight-bearing exercises are: Using elliptical training machines  Doing low-impact aerobics  Using stair-step machines  Fast walking on a treadmill or outside   Muscle-Strengthening Exercises These exercises include activities where you move your body, a weight or some other resistance against gravity. They are also known as resistance exercises and include: Lifting weights  Using elastic exercise bands  Using weight machines  Lifting your own body weight  Functional movements, such as standing and rising up on your toes  Yoga and Pilates can also improve strength, balance and flexibility. However, certain positions may not be safe for people with osteoporosis or those at increased risk of broken  bones. For example, exercises that have you bend forward may increase the chance of breaking a bone in the spine.   Non-Impact Exercises There are other types of exercises that can help prevent falls.  Non-impact exercises can help you to improve balance, posture and how well you move in everyday activities. Some of these exercises include: Balance exercises that strengthen your legs and test your balance, such as Tai Chi, can decrease your risk of falls.  Posture exercises that improve your posture and reduce rounded or "sloping" shoulders can help you decrease the chance of breaking a bone, especially in the spine.  Functional exercises that improve how well you move can help you with everyday activities and decrease your chance of falling and breaking a bone. For example, if you have trouble getting up from a chair or climbing stairs, you should do these activities as exercises.   **A physical therapist can teach you balance, posture and functional exercises. He/she can also help you learn which exercises are safe and appropriate for you.   has a physical therapy office in [place] in front of our office and referrals can be made for assessments   and treatment as needed and strength and balance training.  If you would like to have an assessment with Chad and our physical therapy team please let a nurse or provider know.    

## 2015-02-05 ENCOUNTER — Telehealth: Payer: Self-pay | Admitting: Family

## 2015-02-05 NOTE — Telephone Encounter (Signed)
Pt given appt tomorrow with Alyse Low at 11:10.

## 2015-02-06 ENCOUNTER — Encounter: Payer: Self-pay | Admitting: Family

## 2015-02-06 ENCOUNTER — Ambulatory Visit (INDEPENDENT_AMBULATORY_CARE_PROVIDER_SITE_OTHER): Payer: Commercial Managed Care - HMO | Admitting: Family

## 2015-02-06 ENCOUNTER — Ambulatory Visit (INDEPENDENT_AMBULATORY_CARE_PROVIDER_SITE_OTHER): Payer: Commercial Managed Care - HMO

## 2015-02-06 VITALS — BP 162/86 | HR 66 | Temp 97.4°F | Ht 61.0 in | Wt 137.2 lb

## 2015-02-06 DIAGNOSIS — R079 Chest pain, unspecified: Secondary | ICD-10-CM

## 2015-02-06 DIAGNOSIS — R82998 Other abnormal findings in urine: Secondary | ICD-10-CM

## 2015-02-06 DIAGNOSIS — R8299 Other abnormal findings in urine: Secondary | ICD-10-CM | POA: Diagnosis not present

## 2015-02-06 DIAGNOSIS — R0789 Other chest pain: Secondary | ICD-10-CM

## 2015-02-06 DIAGNOSIS — F411 Generalized anxiety disorder: Secondary | ICD-10-CM | POA: Diagnosis not present

## 2015-02-06 LAB — POCT URINALYSIS DIPSTICK
Bilirubin, UA: NEGATIVE
Glucose, UA: NEGATIVE
Ketones, UA: NEGATIVE
NITRITE UA: NEGATIVE
PROTEIN UA: NEGATIVE
RBC UA: NEGATIVE
UROBILINOGEN UA: NEGATIVE
pH, UA: 5

## 2015-02-06 LAB — POCT UA - MICROSCOPIC ONLY
BACTERIA, U MICROSCOPIC: NEGATIVE
CASTS, UR, LPF, POC: NEGATIVE
CRYSTALS, UR, HPF, POC: NEGATIVE
Mucus, UA: NEGATIVE
RBC, urine, microscopic: NEGATIVE
Yeast, UA: NEGATIVE

## 2015-02-06 MED ORDER — ALENDRONATE SODIUM 70 MG PO TABS: 70.0000 mg | ORAL_TABLET | ORAL | Status: DC

## 2015-02-06 NOTE — Progress Notes (Signed)
   Subjective:    Patient ID: Norma Barajas, female    DOB: 02-Feb-1941, 74 y.o.   MRN: [id number]  Chest Pain  This is a new problem. The current episode started 1 to 4 weeks ago (Three weeks ago). The onset quality is gradual. The problem occurs intermittently. The problem has been unchanged. The pain is present in the substernal region (Right breast area). The pain is at a severity of 6/10. The pain is moderate. The quality of the pain is described as pressure and burning. The pain does not radiate. Associated symptoms include dizziness and exertional chest pressure. Pertinent negatives include no back pain, cough, headaches, lower extremity edema, malaise/fatigue, near-syncope, numbness, orthopnea, palpitations, shortness of breath or syncope. The pain is aggravated by emotional upset and exertion. She has tried acetaminophen for the symptoms. The treatment provided moderate relief. Risk factors include post-menopausal, sedentary lifestyle and stress.  Her past medical history is significant for anxiety/panic attacks.      Review of Systems  Constitutional: Negative.  Negative for malaise/fatigue.  HENT: Negative.   Eyes: Negative.   Respiratory: Negative.  Negative for cough and shortness of breath.   Cardiovascular: Positive for chest pain. Negative for palpitations, orthopnea, syncope and near-syncope.  Gastrointestinal: Negative.   Endocrine: Negative.   Genitourinary: Negative.   Musculoskeletal: Negative.  Negative for back pain.  Neurological: Positive for dizziness. Negative for numbness and headaches.  Hematological: Negative.   Psychiatric/Behavioral: Negative.   All other systems reviewed and are negative.      Objective:   Physical Exam  Constitutional: She is oriented to person, place, and time. She appears well-developed and well-nourished. No distress.  HENT:  Head: Normocephalic and atraumatic.  Right Ear: External ear normal.  Left Ear: External ear normal.  Nose:  Nose normal.  Mouth/Throat: Oropharynx is clear and moist.  Eyes: Pupils are equal, round, and reactive to light.  Neck: Normal range of motion. Neck supple. No thyromegaly present.  Cardiovascular: Normal rate, regular rhythm, normal heart sounds and intact distal pulses.   No murmur heard. Pulmonary/Chest: Effort normal and breath sounds normal. No respiratory distress. She has no wheezes. Right breast exhibits no inverted nipple, no mass, no nipple discharge, no skin change and no tenderness. Left breast exhibits no inverted nipple, no mass, no nipple discharge, no skin change and no tenderness. Breasts are symmetrical.  Abdominal: Soft. Bowel sounds are normal. She exhibits no distension. There is no tenderness.  Musculoskeletal: Normal range of motion. She exhibits no edema or tenderness.  Neurological: She is alert and oriented to person, place, and time. She has normal reflexes. No cranial nerve deficit.  Skin: Skin is warm and dry.  Psychiatric: She has a normal mood and affect. Her behavior is normal. Judgment and thought content normal.  Vitals reviewed.   BP 162/86 mmHg  Pulse 66  Temp(Src) 97.4 F (36.3 C) (Oral)  Ht $R'5\' 1"'hc$  (1.549 m)  Wt 137 lb 3.2 oz (62.234 kg)  BMI 25.94 kg/m2  EKG WNL Chest X-ray- WNL Preliminary reading by Evelina Dun, FNP [facility]      Assessment & Plan:  1. Chest pain, unspecified chest pain type - EKG 12-Lead  2. Right-sided chest wall pain - DG Chest 2 View; Future - CMP14+EGFR  3. GAD (generalized anxiety disorder) - CMP14+EGFR  4. Dark urine - POCT UA - Microscopic Only - POCT urinalysis dipstick  Stress management discussed Tylenol prn for pain RTO prn  Evelina Dun, FNP

## 2015-02-06 NOTE — Patient Instructions (Signed)
Generalized Anxiety Disorder Generalized anxiety disorder (GAD) is a mental disorder. It interferes with life functions, including relationships, work, and school. GAD is different from normal anxiety, which everyone experiences at some point in their lives in response to specific life events and activities. Normal anxiety actually helps us prepare for and get through these life events and activities. Normal anxiety goes away after the event or activity is over.  GAD causes anxiety that is not necessarily related to specific events or activities. It also causes excess anxiety in proportion to specific events or activities. The anxiety associated with GAD is also difficult to control. GAD can vary from mild to severe. People with severe GAD can have intense waves of anxiety with physical symptoms (panic attacks).  SYMPTOMS The anxiety and worry associated with GAD are difficult to control. This anxiety and worry are related to many life events and activities and also occur more days than not for 6 months or longer. People with GAD also have three or more of the following symptoms (one or more in children):  Restlessness.   Fatigue.  Difficulty concentrating.   Irritability.  Muscle tension.  Difficulty sleeping or unsatisfying sleep. DIAGNOSIS GAD is diagnosed through an assessment by your health care provider. Your health care provider will ask you questions aboutyour mood,physical symptoms, and events in your life. Your health care provider may ask you about your medical history and use of alcohol or drugs, including prescription medicines. Your health care provider may also do a physical exam and blood tests. Certain medical conditions and the use of certain substances can cause symptoms similar to those associated with GAD. Your health care provider may refer you to a mental health specialist for further evaluation. TREATMENT The following therapies are usually used to treat GAD:    Medication. Antidepressant medication usually is prescribed for long-term daily control. Antianxiety medicines may be added in severe cases, especially when panic attacks occur.   Talk therapy (psychotherapy). Certain types of talk therapy can be helpful in treating GAD by providing support, education, and guidance. A form of talk therapy called cognitive behavioral therapy can teach you healthy ways to think about and react to daily life events and activities.  Stress managementtechniques. These include yoga, meditation, and exercise and can be very helpful when they are practiced regularly. A mental health specialist can help determine which treatment is best for you. Some people see improvement with one therapy. However, other people require a combination of therapies. Document Released: 02/19/2013 Document Revised: 03/11/2014 Document Reviewed: 02/19/2013 Exit[employer] Patient Information 2015 [employer]. This information is not intended to replace advice given to you by your health care provider. Make sure you discuss any questions you have with your health care provider.  

## 2015-02-12 ENCOUNTER — Telehealth: Payer: Self-pay | Admitting: Pharmacist

## 2015-02-12 NOTE — Telephone Encounter (Signed)
Patient states that she took one dose of generic fosamax and then read side effects.  She does not want to take anymore.  She is very concerned about side effects and it is making her more nervous.   Discussed her low BMD and risk of fracture.  Patient states she understands but does not want to take medications for osteoporosis.  She has AWV in 1 month - with discuss other treatment options at that visit.

## 2015-02-28 ENCOUNTER — Encounter: Payer: Self-pay | Admitting: Family

## 2015-02-28 ENCOUNTER — Ambulatory Visit (INDEPENDENT_AMBULATORY_CARE_PROVIDER_SITE_OTHER): Payer: Commercial Managed Care - HMO | Admitting: Family

## 2015-02-28 VITALS — BP 149/78 | HR 64 | Temp 97.0°F | Ht 61.0 in | Wt 137.8 lb

## 2015-02-28 DIAGNOSIS — F411 Generalized anxiety disorder: Secondary | ICD-10-CM | POA: Diagnosis not present

## 2015-02-28 DIAGNOSIS — E785 Hyperlipidemia, unspecified: Secondary | ICD-10-CM

## 2015-02-28 DIAGNOSIS — I25119 Atherosclerotic heart disease of native coronary artery with unspecified angina pectoris: Secondary | ICD-10-CM | POA: Diagnosis not present

## 2015-02-28 DIAGNOSIS — I10 Essential (primary) hypertension: Secondary | ICD-10-CM

## 2015-02-28 DIAGNOSIS — K219 Gastro-esophageal reflux disease without esophagitis: Secondary | ICD-10-CM

## 2015-02-28 DIAGNOSIS — M810 Age-related osteoporosis without current pathological fracture: Secondary | ICD-10-CM | POA: Diagnosis not present

## 2015-02-28 DIAGNOSIS — E559 Vitamin D deficiency, unspecified: Secondary | ICD-10-CM | POA: Diagnosis not present

## 2015-02-28 MED ORDER — ALPRAZOLAM 0.25 MG PO TABS: ORAL_TABLET | ORAL | Status: DC

## 2015-02-28 NOTE — Progress Notes (Signed)
Subjective:    Patient ID: Norma Barajas, female    DOB: Jan 06, 1941, 74 y.o.   MRN: [id number]  Hyperlipidemia This is a chronic problem. The current episode started more than 1 year ago. The problem is controlled. Recent lipid tests were reviewed and are normal. She has no history of diabetes or hypothyroidism. Pertinent negatives include no focal sensory loss, leg pain, myalgias or shortness of breath. Current antihyperlipidemic treatment includes statins. The current treatment provides moderate improvement of lipids. Risk factors for coronary artery disease include dyslipidemia, hypertension, post-menopausal, a sedentary lifestyle and family history.  Hypertension This is a chronic problem. The current episode started more than 1 year ago. The problem has been waxing and waning since onset. The problem is uncontrolled. Associated symptoms include anxiety. Pertinent negatives include no headaches, palpitations, peripheral edema or shortness of breath. Risk factors for coronary artery disease include dyslipidemia, family history and post-menopausal state. Past treatments include beta blockers and calcium channel blockers. The current treatment provides significant improvement. Hypertensive end-organ damage includes CAD/MI. There is no history of kidney disease, CVA, heart failure or a thyroid problem. There is no history of sleep apnea.  Gastrophageal Reflux She reports no belching, no coughing, no heartburn, no sore throat or no tooth decay. This is a chronic problem. The current episode started more than 1 year ago. The problem occurs rarely. The problem has been resolved. The symptoms are aggravated by certain foods. Pertinent negatives include no fatigue or muscle weakness. She has tried a PPI for the symptoms. The treatment provided significant relief.  Anxiety Presents for follow-up visit. Patient reports no depressed mood, excessive worry, hyperventilation, irritability, nervous/anxious behavior,  palpitations, restlessness or shortness of breath. Symptoms occur occasionally. The severity of symptoms is mild. The symptoms are aggravated by family issues.   Her past medical history is significant for anxiety/panic attacks and arrhythmia. There is no history of depression. Past treatments include benzodiazephines.      Review of Systems  Constitutional: Negative.  Negative for irritability and fatigue.  HENT: Negative.  Negative for sore throat.   Eyes: Negative.   Respiratory: Negative.  Negative for cough and shortness of breath.   Cardiovascular: Negative.  Negative for palpitations.  Gastrointestinal: Negative.  Negative for heartburn.  Endocrine: Negative.   Genitourinary: Negative.   Musculoskeletal: Negative.  Negative for myalgias and muscle weakness.  Neurological: Negative.  Negative for headaches.  Hematological: Negative.   Psychiatric/Behavioral: Negative.  The patient is not nervous/anxious.   All other systems reviewed and are negative.      Objective:   Physical Exam  Constitutional: She is oriented to person, place, and time. She appears well-developed and well-nourished. No distress.  HENT:  Head: Normocephalic and atraumatic.  Right Ear: External ear normal.  Left Ear: External ear normal.  Nose: Nose normal.  Mouth/Throat: Oropharynx is clear and moist.  Eyes: Pupils are equal, round, and reactive to light.  Neck: Normal range of motion. Neck supple. No thyromegaly present.  Cardiovascular: Normal rate, regular rhythm, normal heart sounds and intact distal pulses.   No murmur heard. Pulmonary/Chest: Effort normal and breath sounds normal. No respiratory distress. She has no wheezes.  Abdominal: Soft. Bowel sounds are normal. She exhibits no distension. There is no tenderness.  Musculoskeletal: Normal range of motion. She exhibits no edema or tenderness.  Neurological: She is alert and oriented to person, place, and time. She has normal reflexes. No  cranial nerve deficit.  Skin: Skin is warm and dry.  Psychiatric: She has a normal mood and affect. Her behavior is normal. Judgment and thought content normal.  Vitals reviewed.     BP 149/78 mmHg  Pulse 64  Temp(Src) 97 F (36.1 C) (Oral)  Ht 5' 1" (1.549 m)  Wt 137 lb 12.8 oz (62.506 kg)  BMI 26.05 kg/m2     Assessment & Plan:  1. Essential hypertension - CMP14+EGFR  2. Atherosclerosis of native coronary artery of native heart with angina pectoris - CMP14+EGFR  3. Gastroesophageal reflux disease without esophagitis - CMP14+EGFR  4. Osteoporosis - CMP14+EGFR  5. Hyperlipidemia - CMP14+EGFR  6. GAD (generalized anxiety disorder) - CMP14+EGFR - ALPRAZolam (XANAX) 0.25 MG tablet; TAKE 1 TABLET BY MOUTH TWICE A DAY AS NEEDED FOR ANXIETY, NERVES, OR SLEEP  Dispense: 60 tablet; Refill: 3  7. Vitamin D deficiency - CMP14+EGFR - Vit D  25 hydroxy (rtn osteoporosis monitoring)   Continue all meds Labs pending Health Maintenance reviewed- Pt refuses all vaccines at this time Diet and exercise encouraged RTO 3 months  Evelina Dun, FNP

## 2015-02-28 NOTE — Patient Instructions (Signed)

## 2015-03-01 LAB — CMP14+EGFR
ALT: 11 IU/L (ref 0–32)
AST: 20 IU/L (ref 0–40)
Albumin/Globulin Ratio: 1.6 (ref 1.1–2.5)
Albumin: 4.5 g/dL (ref 3.5–4.8)
Alkaline Phosphatase: 76 IU/L (ref 39–117)
BUN / CREAT RATIO: 14 (ref 11–26)
BUN: 12 mg/dL (ref 8–27)
Bilirubin Total: 0.3 mg/dL (ref 0.0–1.2)
CO2: 26 mmol/L (ref 18–29)
CREATININE: 0.83 mg/dL (ref 0.57–1.00)
Calcium: 8.9 mg/dL (ref 8.7–10.3)
Chloride: 99 mmol/L (ref 97–108)
GFR, EST AFRICAN AMERICAN: 79 mL/min/{1.73_m2} (ref >59)
GFR, EST NON AFRICAN AMERICAN: 69 mL/min/{1.73_m2} (ref >59)
GLOBULIN, TOTAL: 2.8 g/dL (ref 1.5–4.5)
GLUCOSE: 129 mg/dL — AB (ref 65–99)
Potassium: 4.2 mmol/L (ref 3.5–5.2)
Sodium: 139 mmol/L (ref 134–144)
Total Protein: 7.3 g/dL (ref 6.0–8.5)

## 2015-03-01 LAB — VITAMIN D 25 HYDROXY (VIT D DEFICIENCY, FRACTURES): Vit D, 25-Hydroxy: 41.7 ng/mL (ref 30.0–100.0)

## 2015-03-07 ENCOUNTER — Ambulatory Visit (INDEPENDENT_AMBULATORY_CARE_PROVIDER_SITE_OTHER): Payer: Commercial Managed Care - HMO

## 2015-03-07 ENCOUNTER — Ambulatory Visit (INDEPENDENT_AMBULATORY_CARE_PROVIDER_SITE_OTHER): Payer: Commercial Managed Care - HMO | Admitting: Family

## 2015-03-07 ENCOUNTER — Encounter: Payer: Self-pay | Admitting: Family

## 2015-03-07 VITALS — BP 167/86 | HR 70 | Temp 97.5°F | Ht 61.0 in | Wt 137.0 lb

## 2015-03-07 DIAGNOSIS — M79604 Pain in right leg: Secondary | ICD-10-CM

## 2015-03-07 DIAGNOSIS — R52 Pain, unspecified: Secondary | ICD-10-CM

## 2015-03-07 DIAGNOSIS — S90222A Contusion of left lesser toe(s) with damage to nail, initial encounter: Secondary | ICD-10-CM | POA: Diagnosis not present

## 2015-03-07 DIAGNOSIS — W19XXXA Unspecified fall, initial encounter: Secondary | ICD-10-CM

## 2015-03-07 DIAGNOSIS — M79662 Pain in left lower leg: Secondary | ICD-10-CM | POA: Diagnosis not present

## 2015-03-07 DIAGNOSIS — M79672 Pain in left foot: Secondary | ICD-10-CM | POA: Diagnosis not present

## 2015-03-07 NOTE — Progress Notes (Signed)
   Subjective:    Patient ID: Norma Barajas, female    DOB: 1941/04/13, 74 y.o.   MRN: [id number]  Fall The accident occurred 5 to 7 days ago (Tuesday). The fall occurred while standing. She landed on concrete. There was no blood loss. The point of impact was the left shoulder and left foot. The pain is present in the left foot and left lower leg. The pain is at a severity of 8/10. The pain is moderate. The symptoms are aggravated by standing. Pertinent negatives include no bowel incontinence, headaches, hematuria, numbness or tingling. She has tried acetaminophen for the symptoms. The treatment provided mild relief.      Review of Systems  Constitutional: Negative.   HENT: Negative.   Eyes: Negative.   Respiratory: Negative.  Negative for shortness of breath.   Cardiovascular: Negative.  Negative for palpitations.  Gastrointestinal: Negative.  Negative for bowel incontinence.  Endocrine: Negative.   Genitourinary: Negative.  Negative for hematuria.  Musculoskeletal: Negative.   Neurological: Negative.  Negative for tingling, numbness and headaches.  Hematological: Negative.   Psychiatric/Behavioral: Negative.   All other systems reviewed and are negative.         Objective:   Physical Exam  Constitutional: She is oriented to person, place, and time. She appears well-developed and well-nourished. No distress.  HENT:  Head: Normocephalic and atraumatic.  Eyes: Pupils are equal, round, and reactive to light.  Neck: Normal range of motion. Neck supple. No thyromegaly present.  Cardiovascular: Normal rate, regular rhythm, normal heart sounds and intact distal pulses.   No murmur heard. Pulmonary/Chest: Effort normal and breath sounds normal. No respiratory distress. She has no wheezes.  Abdominal: Soft. Bowel sounds are normal. She exhibits no distension. There is no tenderness.  Musculoskeletal: Normal range of motion. She exhibits tenderness (left foot tendernss with mild ecchymosis,  tenderness in right lower leg). She exhibits no edema.  Neurological: She is alert and oriented to person, place, and time. She has normal reflexes. No cranial nerve deficit.  Skin: Skin is warm and dry.  Psychiatric: She has a normal mood and affect. Her behavior is normal. Judgment and thought content normal.  Vitals reviewed.   Left foot x-ray-WNL Preliminary reading by Evelina Dun, FNP [facility]  Right leg X-ray- Screw out of place from previous surgery? Preliminary reading by Evelina Dun, FNP [facility]   BP 167/86 mmHg  Pulse 70  Temp(Src) 97.5 F (36.4 C) (Oral)  Ht 5\' 1"  (1.549 m)  Wt 137 lb (62.143 kg)  BMI 25.90 kg/m2     Assessment & Plan:  1. Pain - DG Tibia/Fibula Right; Future - DG Foot Complete Left; Future  2. Subungual hematoma of left foot, initial encounter  3. Fall, initial encounter - Ambulatory referral to Orthopedic Surgery  4. Right leg pain - Ambulatory referral to Orthopedic Surgery  Falls precautions discussed Rest Elevated when possible  Ice Pt to follow up with ortho about leg Tylenol prn for pain- Per pt's preference  No fracture noted in foot or leg RTO prn  Evelina Dun, FNP

## 2015-03-07 NOTE — Patient Instructions (Signed)

## 2015-03-13 DIAGNOSIS — M79672 Pain in left foot: Secondary | ICD-10-CM | POA: Diagnosis not present

## 2015-03-13 DIAGNOSIS — S92343A Displaced fracture of fourth metatarsal bone, unspecified foot, initial encounter for closed fracture: Secondary | ICD-10-CM | POA: Diagnosis not present

## 2015-03-13 DIAGNOSIS — M898X6 Other specified disorders of bone, lower leg: Secondary | ICD-10-CM | POA: Diagnosis not present

## 2015-03-31 ENCOUNTER — Other Ambulatory Visit: Payer: Self-pay | Admitting: Family

## 2015-03-31 ENCOUNTER — Telehealth: Payer: Self-pay | Admitting: Family

## 2015-03-31 DIAGNOSIS — I10 Essential (primary) hypertension: Secondary | ICD-10-CM

## 2015-03-31 MED ORDER — ATENOLOL 50 MG PO TABS: 25.0000 mg | ORAL_TABLET | Freq: Every day | ORAL | Status: DC

## 2015-03-31 NOTE — Telephone Encounter (Signed)
done

## 2015-03-31 NOTE — Telephone Encounter (Signed)
Patient calls again and says since we already sent her rx in it is ok to leave it that way.

## 2015-04-04 ENCOUNTER — Encounter: Payer: Self-pay | Admitting: Pharmacist

## 2015-04-04 ENCOUNTER — Ambulatory Visit (INDEPENDENT_AMBULATORY_CARE_PROVIDER_SITE_OTHER): Payer: Commercial Managed Care - HMO | Admitting: Pharmacist

## 2015-04-04 VITALS — BP 142/80 | HR 64 | Ht 61.0 in | Wt 139.0 lb

## 2015-04-04 DIAGNOSIS — E8881 Metabolic syndrome: Secondary | ICD-10-CM | POA: Diagnosis not present

## 2015-04-04 DIAGNOSIS — R7309 Other abnormal glucose: Secondary | ICD-10-CM

## 2015-04-04 DIAGNOSIS — Z0000 Encounter for general adult medical examination without abnormal findings: Secondary | ICD-10-CM | POA: Diagnosis not present

## 2015-04-04 DIAGNOSIS — R7303 Prediabetes: Secondary | ICD-10-CM | POA: Insufficient documentation

## 2015-04-04 LAB — POCT GLYCOSYLATED HEMOGLOBIN (HGB A1C): HEMOGLOBIN A1C: 5.6

## 2015-04-04 MED ORDER — CALCIUM CITRATE-VITAMIN D 315-200 MG-UNIT PO TABS: 2.0000 | ORAL_TABLET | ORAL | Status: DC

## 2015-04-04 NOTE — Patient Instructions (Addendum)
[name] , Thank you for taking time to come for your Medicare Wellness Visit. I appreciate your ongoing commitment to your health goals. Please review the following plan we discussed and let me know if I can assist you in the future.   These are the goals we discussed: Goals    . Exercise 3 to 7 times per week (30 min per time)     Continue daily walking.  Great job!        Increase non-starchy vegetables - carrots, green bean, squash, zucchini, tomatoes, onions, peppers, spinach and other green leafy vegetables, cabbage, lettuce, cucumbers, asparagus, okra (not fried), eggplant limit sugar and processed foods (cakes, cookies, ice cream, crackers and chips) Increase fresh fruit but limit serving sizes 1/2 cup or about the size of tennis or baseball limit red meat to no more than 1-2 times per week (serving size about the size of your palm) Choose whole grains / lean proteins - whole wheat bread, quinoa, whole grain rice (1/2 cup), fish, chicken, [place]  Stop alendronate / Fosamax.  I will check into insurance coverage for Prolia - injection for osteoporosis that you receive every 6 months.  This is a list of the screening recommended for you and due dates:  Health Maintenance  Topic Date Due  . Shingles Vaccine  Due now  . Tetanus Vaccine  Due now  . Pneumonia vaccines (1 of 2 - PCV13) Due now  . Flu Shot  06/09/2015  . Mammogram  07/02/2016  . DEXA scan (bone density measurement)  01/28/2017  . Colon Cancer Screening  10/19/2017  *Topic was postponed. The date shown is not the original due date.    Fall Prevention and Home Safety Falls cause injuries and can affect all age groups. It is possible to use preventive measures to significantly decrease the likelihood of falls. There are many simple measures which can make your home safer and prevent falls. OUTDOORS  Repair cracks and edges of walkways and driveways.  Remove high doorway thresholds.  Trim shrubbery on the main  path into your home.  Have good outside lighting.  Clear walkways of tools, rocks, debris, and clutter.  Check that handrails are not broken and are securely fastened. Both sides of steps should have handrails.  Have leaves, snow, and ice cleared regularly.  Use sand or salt on walkways during winter months.  In the garage, clean up grease or oil spills. BATHROOM  Install night lights.  Install grab bars by the toilet and in the tub and shower.  Use non-skid mats or decals in the tub or shower.  Place a plastic non-slip stool in the shower to sit on, if needed.  Keep floors dry and clean up all water on the floor immediately.  Remove soap buildup in the tub or shower on a regular basis.  Secure bath mats with non-slip, double-sided rug tape.  Remove throw rugs and tripping hazards from the floors. BEDROOMS  Install night lights.  Make sure a bedside light is easy to reach.  Do not use oversized bedding.  Keep a telephone by your bedside.  Have a firm chair with side arms to use for getting dressed.  Remove throw rugs and tripping hazards from the floor. KITCHEN  Keep handles on pots and pans turned toward the center of the stove. Use back burners when possible.  Clean up spills quickly and allow time for drying.  Avoid walking on wet floors.  Avoid hot utensils and knives.  Position shelves so they are not too high or low.  Place commonly used objects within easy reach.  If necessary, use a sturdy step stool with a grab bar when reaching.  Keep electrical cables out of the way.  Do not use floor polish or wax that makes floors slippery. If you must use wax, use non-skid floor wax.  Remove throw rugs and tripping hazards from the floor. STAIRWAYS  Never leave objects on stairs.  Place handrails on both sides of stairways and use them. Fix any loose handrails. Make sure handrails on both sides of the stairways are as long as the stairs.  Check  carpeting to make sure it is firmly attached along stairs. Make repairs to worn or loose carpet promptly.  Avoid placing throw rugs at the top or bottom of stairways, or properly secure the rug with carpet tape to prevent slippage. Get rid of throw rugs, if possible.  Have an electrician put in a light switch at the top and bottom of the stairs. OTHER FALL PREVENTION TIPS  Wear low-heel or rubber-soled shoes that are supportive and fit well. Wear closed toe shoes.  When using a stepladder, make sure it is fully opened and both spreaders are firmly locked. Do not climb a closed stepladder.  Add color or contrast paint or tape to grab bars and handrails in your home. Place contrasting color strips on first and last steps.  Learn and use mobility aids as needed. Install an electrical emergency response system.  Turn on lights to avoid dark areas. Replace light bulbs that burn out immediately. Get light switches that glow.  Arrange furniture to create clear pathways. Keep furniture in the same place.  Firmly attach carpet with non-skid or double-sided tape.  Eliminate uneven floor surfaces.  Select a carpet pattern that does not visually hide the edge of steps.  Be aware of all pets. OTHER HOME SAFETY TIPS  Set the water temperature for 120 F (48.8 C).  Keep emergency numbers on or near the telephone.  Keep smoke detectors on every level of the home and near sleeping areas. Document Released: 10/15/2002 Document Revised: 04/25/2012 Document Reviewed: 01/14/2012 [facility] Patient Information 2015 [place], [place]. This information is not intended to replace advice given to you by your health care provider. Make sure you discuss any questions you have with your health care provider.  Preventive Care for Adults A healthy lifestyle and preventive care can promote health and wellness. Preventive health guidelines for women include the following key practices.  A routine yearly physical  is a good way to check with your health care provider about your health and preventive screening. It is a chance to share any concerns and updates on your health and to receive a thorough exam.  Visit your dentist for a routine exam and preventive care every 6 months. Brush your teeth twice a day and floss once a day. Good oral hygiene prevents tooth decay and gum disease.  The frequency of eye exams is based on your age, health, family medical history, use of contact lenses, and other factors. Follow your health care provider's recommendations for frequency of eye exams.  Eat a healthy diet. Foods like vegetables, fruits, whole grains, low-fat dairy products, and lean protein foods contain the nutrients you need without too many calories. Decrease your intake of foods high in solid fats, added sugars, and salt. Eat the right amount of calories for you.Get information about a proper diet from your health care provider, if  necessary.  Regular physical exercise is one of the most important things you can do for your health. Most adults should get at least 150 minutes of moderate-intensity exercise (any activity that increases your heart rate and causes you to sweat) each week. In addition, most adults need muscle-strengthening exercises on 2 or more days a week.  Maintain a healthy weight. The body mass index (BMI) is a screening tool to identify possible weight problems. It provides an estimate of body fat based on height and weight. Your health care provider can find your BMI and can help you achieve or maintain a healthy weight.For adults 20 years and older:  A BMI below 18.5 is considered underweight.  A BMI of 18.5 to 24.9 is normal.  A BMI of 25 to 29.9 is considered overweight.  A BMI of 30 and above is considered obese.  Maintain normal blood lipids and cholesterol levels by exercising and minimizing your intake of saturated fat. Eat a balanced diet with plenty of fruit and vegetables.  Blood tests for lipids and cholesterol should begin at age 27 and be repeated every 5 years. If your lipid or cholesterol levels are high, you are over 50, or you are at high risk for heart disease, you may need your cholesterol levels checked more frequently.Ongoing high lipid and cholesterol levels should be treated with medicines if diet and exercise are not working.  If you smoke, find out from your health care provider how to quit. If you do not use tobacco, do not start.  Lung cancer screening is recommended for adults aged 46-80 years who are at high risk for developing lung cancer because of a history of smoking. A yearly low-dose CT scan of the lungs is recommended for people who have at least a 30-pack-year history of smoking and are a current smoker or have quit within the past 15 years. A pack year of smoking is smoking an average of 1 pack of cigarettes a day for 1 year (for example: 1 pack a day for 30 years or 2 packs a day for 15 years). Yearly screening should continue until the smoker has stopped smoking for at least 15 years. Yearly screening should be stopped for people who develop a health problem that would prevent them from having lung cancer treatment.  If you are pregnant, do not drink alcohol. If you are breastfeeding, be very cautious about drinking alcohol. If you are not pregnant and choose to drink alcohol, do not have more than 1 drink per day. One drink is considered to be 12 ounces (355 mL) of beer, 5 ounces (148 mL) of wine, or 1.5 ounces (44 mL) of liquor.  Avoid use of street drugs. Do not share needles with anyone. Ask for help if you need support or instructions about stopping the use of drugs.  High blood pressure causes heart disease and increases the risk of stroke. Your blood pressure should be checked at least every 1 to 2 years. Ongoing high blood pressure should be treated with medicines if weight loss and exercise do not work.  If you are 64-29 years old,  ask your health care provider if you should take aspirin to prevent strokes.  Diabetes screening involves taking a blood sample to check your fasting blood sugar level. This should be done once every 3 years, after age 75, if you are within normal weight and without risk factors for diabetes. Testing should be considered at a younger age or be carried out more  frequently if you are overweight and have at least 1 risk factor for diabetes.  Breast cancer screening is essential preventive care for women. You should practice "breast self-awareness." This means understanding the normal appearance and feel of your breasts and may include breast self-examination. Any changes detected, no matter how small, should be reported to a health care provider. Women in their 28s and 30s should have a clinical breast exam (CBE) by a health care provider as part of a regular health exam every 1 to 3 years. After age 18, women should have a CBE every year. Starting at age 64, women should consider having a mammogram (breast X-ray test) every year. Women who have a family history of breast cancer should talk to their health care provider about genetic screening. Women at a high risk of breast cancer should talk to their health care providers about having an MRI and a mammogram every year.  Breast cancer gene (BRCA)-related cancer risk assessment is recommended for women who have family members with BRCA-related cancers. BRCA-related cancers include breast, ovarian, tubal, and peritoneal cancers. Having family members with these cancers may be associated with an increased risk for harmful changes (mutations) in the breast cancer genes BRCA1 and BRCA2. Results of the assessment will determine the need for genetic counseling and BRCA1 and BRCA2 testing.  Routine pelvic exams to screen for cancer are no longer recommended for nonpregnant women who are considered low risk for cancer of the pelvic organs (ovaries, uterus, and vagina)  and who do not have symptoms. Ask your health care provider if a screening pelvic exam is right for you.  If you have had past treatment for cervical cancer or a condition that could lead to cancer, you need Pap tests and screening for cancer for at least 20 years after your treatment. If Pap tests have been discontinued, your risk factors (such as having a new sexual partner) need to be reassessed to determine if screening should be resumed. Some women have medical problems that increase the chance of getting cervical cancer. In these cases, your health care provider may recommend more frequent screening and Pap tests.  The HPV test is an additional test that may be used for cervical cancer screening. The HPV test looks for the virus that can cause the cell changes on the cervix. The cells collected during the Pap test can be tested for HPV. The HPV test could be used to screen women aged 6 years and older, and should be used in women of any age who have unclear Pap test results. After the age of 73, women should have HPV testing at the same frequency as a Pap test.  Colorectal cancer can be detected and often prevented. Most routine colorectal cancer screening begins at the age of 1 years and continues through age 7 years. However, your health care provider may recommend screening at an earlier age if you have risk factors for colon cancer. On a yearly basis, your health care provider may provide home test kits to check for hidden blood in the stool. Use of a small camera at the end of a tube, to directly examine the colon (sigmoidoscopy or colonoscopy), can detect the earliest forms of colorectal cancer. Talk to your health care provider about this at age 40, when routine screening begins. Direct exam of the colon should be repeated every 5-10 years through age 63 years, unless early forms of pre-cancerous polyps or small growths are found.  People who are at an  increased risk for hepatitis B should  be screened for this virus. You are considered at high risk for hepatitis B if:  You were born in a country where hepatitis B occurs often. Talk with your health care provider about which countries are considered high risk.  Your parents were born in a high-risk country and you have not received a shot to protect against hepatitis B (hepatitis B vaccine).  You have HIV or AIDS.  You use needles to inject street drugs.  You live with, or have sex with, someone who has hepatitis B.  You get hemodialysis treatment.  You take certain medicines for conditions like cancer, organ transplantation, and autoimmune conditions.  Hepatitis C blood testing is recommended for all people born from [birth year] through 1967 and any individual with known risks for hepatitis C.  Practice safe sex. Use condoms and avoid high-risk sexual practices to reduce the spread of sexually transmitted infections (STIs). STIs include gonorrhea, chlamydia, syphilis, trichomonas, herpes, HPV, and human immunodeficiency virus (HIV). Herpes, HIV, and HPV are viral illnesses that have no cure. They can result in disability, cancer, and death.  You should be screened for sexually transmitted illnesses (STIs) including gonorrhea and chlamydia if:  You are sexually active and are younger than 24 years.  You are older than 24 years and your health care provider tells you that you are at risk for this type of infection.  Your sexual activity has changed since you were last screened and you are at an increased risk for chlamydia or gonorrhea. Ask your health care provider if you are at risk.  If you are at risk of being infected with HIV, it is recommended that you take a prescription medicine daily to prevent HIV infection. This is called preexposure prophylaxis (PrEP). You are considered at risk if:  You are a heterosexual woman, are sexually active, and are at increased risk for HIV infection.  You take drugs by injection.  You  are sexually active with a partner who has HIV.  Talk with your health care provider about whether you are at high risk of being infected with HIV. If you choose to begin PrEP, you should first be tested for HIV. You should then be tested every 3 months for as long as you are taking PrEP.  Osteoporosis is a disease in which the bones lose minerals and strength with aging. This can result in serious bone fractures or breaks. The risk of osteoporosis can be identified using a bone density scan. Women ages 73 years and over and women at risk for fractures or osteoporosis should discuss screening with their health care providers. Ask your health care provider whether you should take a calcium supplement or vitamin D to reduce the rate of osteoporosis.  Menopause can be associated with physical symptoms and risks. Hormone replacement therapy is available to decrease symptoms and risks. You should talk to your health care provider about whether hormone replacement therapy is right for you.  Use sunscreen. Apply sunscreen liberally and repeatedly throughout the day. You should seek shade when your shadow is shorter than you. Protect yourself by wearing long sleeves, pants, a wide-brimmed hat, and sunglasses year round, whenever you are outdoors.  Once a month, do a whole body skin exam, using a mirror to look at the skin on your back. Tell your health care provider of new moles, moles that have irregular borders, moles that are larger than a pencil eraser, or moles that have changed in shape  or color.  Stay current with required vaccines (immunizations).  Influenza vaccine. All adults should be immunized every year.  Tetanus, diphtheria, and acellular pertussis (Td, Tdap) vaccine. Pregnant women should receive 1 dose of Tdap vaccine during each pregnancy. The dose should be obtained regardless of the length of time since the last dose. Immunization is preferred during the 27th-36th week of gestation. An  adult who has not previously received Tdap or who does not know her vaccine status should receive 1 dose of Tdap. This initial dose should be followed by tetanus and diphtheria toxoids (Td) booster doses every 10 years. Adults with an unknown or incomplete history of completing a 3-dose immunization series with Td-containing vaccines should begin or complete a primary immunization series including a Tdap dose. Adults should receive a Td booster every 10 years.  Varicella vaccine. An adult without evidence of immunity to varicella should receive 2 doses or a second dose if she has previously received 1 dose. Pregnant females who do not have evidence of immunity should receive the first dose after pregnancy. This first dose should be obtained before leaving the health care facility. The second dose should be obtained 4-8 weeks after the first dose.  Human papillomavirus (HPV) vaccine. Females aged 13-26 years who have not received the vaccine previously should obtain the 3-dose series. The vaccine is not recommended for use in pregnant females. However, pregnancy testing is not needed before receiving a dose. If a female is found to be pregnant after receiving a dose, no treatment is needed. In that case, the remaining doses should be delayed until after the pregnancy. Immunization is recommended for any person with an immunocompromised condition through the age of 60 years if she did not get any or all doses earlier. During the 3-dose series, the second dose should be obtained 4-8 weeks after the first dose. The third dose should be obtained 24 weeks after the first dose and 16 weeks after the second dose.  Zoster vaccine. One dose is recommended for adults aged 23 years or older unless certain conditions are present.  Measles, mumps, and rubella (MMR) vaccine. Adults born before [birth year] generally are considered immune to measles and mumps. Adults born in [birth year] or later should have 1 or more doses of MMR vaccine  unless there is a contraindication to the vaccine or there is laboratory evidence of immunity to each of the three diseases. A routine second dose of MMR vaccine should be obtained at least 28 days after the first dose for students attending postsecondary schools, health care workers, or international travelers. People who received inactivated measles vaccine or an unknown type of measles vaccine during 1963-1967 should receive 2 doses of MMR vaccine. People who received inactivated mumps vaccine or an unknown type of mumps vaccine before 1979 and are at high risk for mumps infection should consider immunization with 2 doses of MMR vaccine. For females of childbearing age, rubella immunity should be determined. If there is no evidence of immunity, females who are not pregnant should be vaccinated. If there is no evidence of immunity, females who are pregnant should delay immunization until after pregnancy. Unvaccinated health care workers born before [birth year] who lack laboratory evidence of measles, mumps, or rubella immunity or laboratory confirmation of disease should consider measles and mumps immunization with 2 doses of MMR vaccine or rubella immunization with 1 dose of MMR vaccine.  Pneumococcal 13-valent conjugate (PCV13) vaccine. When indicated, a person who is uncertain of her immunization history and has  no record of immunization should receive the PCV13 vaccine. An adult aged 59 years or older who has certain medical conditions and has not been previously immunized should receive 1 dose of PCV13 vaccine. This PCV13 should be followed with a dose of pneumococcal polysaccharide (PPSV23) vaccine. The PPSV23 vaccine dose should be obtained at least 8 weeks after the dose of PCV13 vaccine. An adult aged 65 years or older who has certain medical conditions and previously received 1 or more doses of PPSV23 vaccine should receive 1 dose of PCV13. The PCV13 vaccine dose should be obtained 1 or more years after the  last PPSV23 vaccine dose.  Pneumococcal polysaccharide (PPSV23) vaccine. When PCV13 is also indicated, PCV13 should be obtained first. All adults aged 48 years and older should be immunized. An adult younger than age 63 years who has certain medical conditions should be immunized. Any person who resides in a nursing home or long-term care facility should be immunized. An adult smoker should be immunized. People with an immunocompromised condition and certain other conditions should receive both PCV13 and PPSV23 vaccines. People with human immunodeficiency virus (HIV) infection should be immunized as soon as possible after diagnosis. Immunization during chemotherapy or radiation therapy should be avoided. Routine use of PPSV23 vaccine is not recommended for American Indians, [place] Natives, or people younger than 65 years unless there are medical conditions that require PPSV23 vaccine. When indicated, people who have unknown immunization and have no record of immunization should receive PPSV23 vaccine. One-time revaccination 5 years after the first dose of PPSV23 is recommended for people aged 19-64 years who have chronic kidney failure, nephrotic syndrome, asplenia, or immunocompromised conditions. People who received 1-2 doses of PPSV23 before age 40 years should receive another dose of PPSV23 vaccine at age 36 years or later if at least 5 years have passed since the previous dose. Doses of PPSV23 are not needed for people immunized with PPSV23 at or after age 22 years.  Meningococcal vaccine. Adults with asplenia or persistent complement component deficiencies should receive 2 doses of quadrivalent meningococcal conjugate (MenACWY-D) vaccine. The doses should be obtained at least 2 months apart. Microbiologists working with certain meningococcal bacteria, [place] recruits, people at risk during an outbreak, and people who travel to or live in countries with a high rate of meningitis should be immunized. A  first-year college student up through age 43 years who is living in a residence hall should receive a dose if she did not receive a dose on or after her 16th birthday. Adults who have certain high-risk conditions should receive one or more doses of vaccine.  Hepatitis A vaccine. Adults who wish to be protected from this disease, have certain high-risk conditions, work with hepatitis A-infected animals, work in hepatitis A research labs, or travel to or work in countries with a high rate of hepatitis A should be immunized. Adults who were previously unvaccinated and who anticipate close contact with an international adoptee during the first 60 days after arrival in the [place] from a country with a high rate of hepatitis A should be immunized.  Hepatitis B vaccine. Adults who wish to be protected from this disease, have certain high-risk conditions, may be exposed to blood or other infectious body fluids, are household contacts or sex partners of hepatitis B positive people, are clients or workers in certain care facilities, or travel to or work in countries with a high rate of hepatitis B should be immunized.  Haemophilus influenzae type b (Hib)  vaccine. A previously unvaccinated person with asplenia or sickle cell disease or having a scheduled splenectomy should receive 1 dose of Hib vaccine. Regardless of previous immunization, a recipient of a hematopoietic stem cell transplant should receive a 3-dose series 6-12 months after her successful transplant. Hib vaccine is not recommended for adults with HIV infection. Preventive Services / Frequency Ages 38 years and over  Blood pressure check.** / Every 1 to 2 years.  Lipid and cholesterol check.** / Every 5 years beginning at age 40 years.  Lung cancer screening. / Every year if you are aged 54-80 years and have a 30-pack-year history of smoking and currently smoke or have quit within the past 15 years. Yearly screening is stopped once you have  quit smoking for at least 15 years or develop a health problem that would prevent you from having lung cancer treatment.  Clinical breast exam.** / Every year after age 54 years.  BRCA-related cancer risk assessment.** / For women who have family members with a BRCA-related cancer (breast, ovarian, tubal, or peritoneal cancers).  Mammogram.** / Every year beginning at age 4 years and continuing for as long as you are in good health. Consult with your health care provider.  Pap test.** / Every 3 years starting at age 62 years through age 3 or 66 years with 3 consecutive normal Pap tests. Testing can be stopped between 65 and 70 years with 3 consecutive normal Pap tests and no abnormal Pap or HPV tests in the past 10 years.  HPV screening.** / Every 3 years from ages 77 years through ages 8 or 68 years with a history of 3 consecutive normal Pap tests. Testing can be stopped between 65 and 70 years with 3 consecutive normal Pap tests and no abnormal Pap or HPV tests in the past 10 years.  Fecal occult blood test (FOBT) of stool. / Every year beginning at age 57 years and continuing until age 83 years. You may not need to do this test if you get a colonoscopy every 10 years.  Flexible sigmoidoscopy or colonoscopy.** / Every 5 years for a flexible sigmoidoscopy or every 10 years for a colonoscopy beginning at age 75 years and continuing until age 90 years.  Hepatitis C blood test.** / For all people born from [birth year] through 1967 and any individual with known risks for hepatitis C.  Osteoporosis screening.** / A one-time screening for women ages 22 years and over and women at risk for fractures or osteoporosis.  Skin self-exam. / Monthly.  Influenza vaccine. / Every year.  Tetanus, diphtheria, and acellular pertussis (Tdap/Td) vaccine.** / 1 dose of Td every 10 years.  Varicella vaccine.** / Consult your health care provider.  Zoster vaccine.** / 1 dose for adults aged 105 years or  older.  Pneumococcal 13-valent conjugate (PCV13) vaccine.** / Consult your health care provider.  Pneumococcal polysaccharide (PPSV23) vaccine.** / 1 dose for all adults aged 54 years and older.  Meningococcal vaccine.** / Consult your health care provider.  Hepatitis A vaccine.** / Consult your health care provider.  Hepatitis B vaccine.** / Consult your health care provider.  Haemophilus influenzae type b (Hib) vaccine.** / Consult your health care provider. ** Family history and personal history of risk and conditions may change your health care provider's recommendations. Document Released: 12/21/2001 Document Revised: 03/11/2014 Document Reviewed: 03/22/2011 [facility] Patient Information 2015 [place], [place]. This information is not intended to replace advice given to you by your health care provider. Make sure you discuss any  questions you have with your health care provider.

## 2015-04-04 NOTE — Progress Notes (Signed)
Patient ID: Norma Barajas, female   DOB: Jun 27, 1941, 74 y.o.   MRN: [id number]    Subjective:   Norma Barajas is a 74 y.o. female who presents for an Initial Medicare Annual Wellness Visit and to discuss treatment for ostoeporosis  Last DEXA was 01/28/2014.  Patient was started on Prolia but she is concerned about side effect of increased femur fractures.   Current Medications (verified) Outpatient Encounter Prescriptions as of 04/04/2015  Medication Sig  . ALPRAZolam (XANAX) 0.25 MG tablet TAKE 1 TABLET BY MOUTH TWICE A DAY AS NEEDED FOR ANXIETY, NERVES, OR SLEEP  . amLODipine (NORVASC) 10 MG tablet Take 0.5 tablets (5 mg total) by mouth daily after supper.  Marland Kitchen aspirin EC 81 MG tablet Take 81 mg by mouth daily as needed (when she feels like she needs it).   Marland Kitchen atenolol (TENORMIN) 50 MG tablet Take 0.5 tablets (25 mg total) by mouth daily.  . cholecalciferol (VITAMIN D) 1000 UNITS tablet Take 1,000 Units by mouth daily.  . Multiple Vitamin (MULTIVITAMIN) capsule Take 1 capsule by mouth every evening.  Marland Kitchen omeprazole (PRILOSEC) 20 MG capsule Take 1 capsule (20 mg total) by mouth 2 (two) times daily. (Patient taking differently: Take 20 mg by mouth daily. )  . simvastatin (ZOCOR) 40 MG tablet Take 1 tablet (40 mg total) by mouth every evening.  . [DISCONTINUED] Calcium Carbonate-Vitamin D 600-400 MG-UNIT per chew tablet Chew 1 tablet by mouth 2 (two) times daily.  . calcium citrate-vitamin D (CITRACAL+D) 315-200 MG-UNIT per tablet Take 2 tablets by mouth every morning.  . [DISCONTINUED] alendronate (FOSAMAX) 70 MG tablet Take 70 mg by mouth once a week. Take with a full glass of water on an empty stomach.  . [DISCONTINUED] calcium citrate-vitamin D 200-200 MG-UNIT TABS Take 1 tablet by mouth every morning.    No facility-administered encounter medications on file as of 04/04/2015.    Allergies (verified) Crestor; Iodine; Iohexol; Morphine; Risedronate sodium; Triamterene-hctz; and Atorvastatin    History: Past Medical History  Diagnosis Date  . Essential hypertension, benign   . Mitral regurgitation     Mild 09/2012  . Erosive esophagitis   . GERD (gastroesophageal reflux disease)   . Hypercholesterolemia   . Coronary atherosclerosis of native coronary artery     Nonobstructive CAD 10/2005  . Paroxysmal atrial fibrillation   . Diastolic dysfunction     Grade 1. Ejection fraction 60-65%.  . Complication of anesthesia   . PONV (postoperative nausea and vomiting)   . Arthritis   . Osteoporosis   . Tibial fracture 2013    right  . Cataract    Past Surgical History  Procedure Laterality Date  . Abdominal hysterectomy    . Prolapsed uterine fibroid ligation  2015  . Cataract extraction    . Steel plate in the leg Right     broke tibia and fibula after falling down stairs  . Esophagogastroduodenoscopy  2001    Dr. [name]: erosive esophagitis, esophageal stricture, duodenitis, s/p Savary dilation  . Colonoscopy  2008    Dr. [name]: internal hemorrhoids   . Esophagogastroduodenoscopy (egd) with esophageal dilation  11/15/2012    [id number]:[id number] web was found & MOST LIKELY CAUSE FOR DYAPHAGIA/Polyp was found in the gastric body and gastric fundus/ gastritis on bx  . Bravo ph study  11/15/2012    Procedure: BRAVO [place] STUDY;  Surgeon: [name], MD;  [facility];  Service: Endoscopy;;  . Flexible sigmoidoscopy N/A 08/22/2014  Procedure: FLEXIBLE SIGMOIDOSCOPY;  Surgeon: [name], MD;  [facility];  Service: Endoscopy;  Laterality: N/A;  830  . Hemorrhoid banding N/A 08/22/2014    Procedure: HEMORRHOID BANDING;  Surgeon: [name], MD;  [facility];  Service: Endoscopy;  Laterality: N/A;  . Eye surgery     Family History  Problem Relation Age of Onset  . Colon cancer Mother     Diagnosed at age 10  . Heart disease Mother   . Osteoporosis Mother   . Hip fracture Mother   . Stroke Sister   . Diabetes Sister   . Cancer -  Other Sister   . Osteoporosis Sister   . Arthritis Sister   . Cancer Sister     uterine  . Stroke Sister   . Heart disease Father   . Hyperlipidemia Brother   . Hypertension Brother   . Stroke Brother   . Diabetes Son    Social History   Occupational History  . Retired     Tourist information centre manager   Social History Main Topics  . Smoking status: Never Smoker   . Smokeless tobacco: Never Used     Comment: Never smoked  . Alcohol Use: No  . Drug Use: No  . Sexual Activity: Yes    Do you feel safe at home?  Yes  Dietary issues and exercise activities: Current Exercise Habits:: Home exercise routine, Type of exercise: walking, Time (Minutes): 20, Frequency (Times/Week): 5, Weekly Exercise (Minutes/Week): 100, Intensity: Moderate  Current Dietary habits:  Tried to limit CHO intake due to history of elevated BG in past.    Objective:    Today's Vitals   04/04/15 1017  BP: 142/80  Pulse: 64  Height: 5\' 1"  (1.549 m)  Weight: 139 lb (63.05 kg)  PainSc: 0-No pain   Body mass index is 26.28 kg/(m^2).  Activities of Daily Living In your present state of health, do you have any difficulty performing the following activities: 04/04/2015  Hearing? N  Vision? N  Difficulty concentrating or making decisions? N  Walking or climbing stairs? N  Dressing or bathing? N  Doing errands, shopping? N  Preparing Food and eating ? N  Using the Toilet? N  In the past six months, have you accidently leaked urine? N  Do you have problems with loss of bowel control? N  Managing your Medications? N  Managing your Finances? N  Housekeeping or managing your Housekeeping? N    Are there smokers in your home (other than you)? No   Cardiac Risk Factors include: advanced age (>25men, >66 women);dyslipidemia;family history of premature cardiovascular disease;hypertension  Depression Screen PHQ 2/9 Scores 04/04/2015 02/06/2015 09/24/2014 12/14/2013  PHQ - 2 Score 1 0 0 0    Fall Risk Fall Risk  04/04/2015  02/28/2015 02/06/2015 09/24/2014 12/14/2013  Falls in the past year? Yes No No No No  Number falls in past yr: 1 - - - -  Injury with Fall? Yes - - - -  Risk Factor Category  High Fall Risk - - - -  Risk for fall due to : History of fall(s);Other (Comment) - - - -  Risk for fall due to (comments): osteoporosis - - - -    Cognitive Function: MMSE - Mini Mental State Exam 04/04/2015  Orientation to time 4  Orientation to Place 5  Registration 3  Attention/ Calculation 4  Recall 3  Language- name 2 objects 2  Language- repeat 1  Language-  follow 3 step command 3  Language- read & follow direction 1  Write a sentence 1  Copy design 0  Total score 27    Immunizations and Health Maintenance Immunization History  Administered Date(s) Administered  . DTaP 08/27/2009  . Influenza Whole 08/04/2010  . Influenza,inj,Quad PF,36+ Mos 08/20/2013, 08/19/2014   There are no preventive care reminders to display for this patient.  Patient Care Team: Sharion Balloon, FNP as PCP - General (Nurse Practitioner) [name], MD as Attending Physician (Gastroenterology)  Indicate any recent Medical Services you may have received from other than [facility] providers in the past year (date may be approximate).    Assessment:    Annual Wellness Visit  Pre-diabetes Metabolic syndrome Osteoporosis   Screening Tests Health Maintenance  Topic Date Due  . ZOSTAVAX  03/29/2016 (Originally 01/16/1999)  . TETANUS/TDAP  03/29/2016 (Originally 01/15/1958)  . PNA vac Low Risk Adult (1 of 2 - PCV13) 06/29/2016 (Originally 01/16/2004)  . INFLUENZA VACCINE  06/09/2015  . MAMMOGRAM  07/02/2016  . DEXA SCAN  01/28/2017  . COLONOSCOPY  10/19/2017        Plan:   During the course of the visit Norma Barajas was educated and counseled about the following appropriate screening and preventive services:   Vaccines to include Pneumoccal, Influenza, Hepatitis B, Td, Zostavax - patient declined zostavax, prevnar 13, Tdap  and Hep B vaccines  Colorectal cancer screening - Colonoscopy is UTD but patient refuses FOBT which has not been checked in last year.  Cardiovascular disease screening - Triglycerides were elevated.  SBP was slightly elevated and DBP was at goal today.    Diabetes screening - checked A1c today which was WNL.  Discussed limiting CHO intake to help kepp BG and Tg down.  Patient was not fasting today so did not check lipids.   Bone Denisty / Osteoporosis Screening - UTD  Discussed pros and cons of bisphosphonates, prolia and forteo.  Patient would like to try [place] - Looking into insurnace coverage of Prolia  Change to calcium citrate 315mg  2 qd in am.  Also advised to separate calcium and MVI so patient will start taking MVI qpm.  Mammogram - recheck 2017  Glaucoma screening / Diabetic Eye Exam - due recheck - last was 2014 - patient to make appt  Advanced Directives - information given.    Goals    . Exercise 3 to 7 times per week (30 min per time)     Continue daily walking.  Great job!        Patient Instructions (the written plan) were given to the patient.   Cherre Robins, [place], CPP, CDE   04/04/2015

## 2015-04-09 ENCOUNTER — Telehealth: Payer: Self-pay | Admitting: Pharmacist

## 2015-04-10 ENCOUNTER — Observation Stay (HOSPITAL_COMMUNITY)
Admission: EM | Admit: 2015-04-10 | Discharge: 2015-04-11 | Disposition: A | Discharge status: Home / Self Care | Payer: Commercial Managed Care - HMO | Attending: Cardiovascular Disease | Admitting: Cardiovascular Disease

## 2015-04-10 ENCOUNTER — Encounter (HOSPITAL_COMMUNITY): Payer: Self-pay

## 2015-04-10 DIAGNOSIS — K208 Other esophagitis: Secondary | ICD-10-CM | POA: Insufficient documentation

## 2015-04-10 DIAGNOSIS — R531 Weakness: Secondary | ICD-10-CM | POA: Diagnosis not present

## 2015-04-10 DIAGNOSIS — Z7982 Long term (current) use of aspirin: Secondary | ICD-10-CM | POA: Diagnosis not present

## 2015-04-10 DIAGNOSIS — Z8781 Personal history of (healed) traumatic fracture: Secondary | ICD-10-CM | POA: Insufficient documentation

## 2015-04-10 DIAGNOSIS — R079 Chest pain, unspecified: Secondary | ICD-10-CM | POA: Diagnosis present

## 2015-04-10 DIAGNOSIS — R61 Generalized hyperhidrosis: Secondary | ICD-10-CM | POA: Insufficient documentation

## 2015-04-10 DIAGNOSIS — F411 Generalized anxiety disorder: Secondary | ICD-10-CM | POA: Diagnosis present

## 2015-04-10 DIAGNOSIS — R404 Transient alteration of awareness: Secondary | ICD-10-CM | POA: Diagnosis not present

## 2015-04-10 DIAGNOSIS — T45515A Adverse effect of anticoagulants, initial encounter: Secondary | ICD-10-CM | POA: Insufficient documentation

## 2015-04-10 DIAGNOSIS — I519 Heart disease, unspecified: Secondary | ICD-10-CM | POA: Insufficient documentation

## 2015-04-10 DIAGNOSIS — M1990 Unspecified osteoarthritis, unspecified site: Secondary | ICD-10-CM | POA: Insufficient documentation

## 2015-04-10 DIAGNOSIS — E780 Pure hypercholesterolemia: Secondary | ICD-10-CM | POA: Insufficient documentation

## 2015-04-10 DIAGNOSIS — I10 Essential (primary) hypertension: Secondary | ICD-10-CM | POA: Diagnosis not present

## 2015-04-10 DIAGNOSIS — I2510 Atherosclerotic heart disease of native coronary artery without angina pectoris: Secondary | ICD-10-CM | POA: Diagnosis present

## 2015-04-10 DIAGNOSIS — I080 Rheumatic disorders of both mitral and aortic valves: Secondary | ICD-10-CM | POA: Diagnosis present

## 2015-04-10 DIAGNOSIS — I480 Paroxysmal atrial fibrillation: Secondary | ICD-10-CM | POA: Diagnosis not present

## 2015-04-10 DIAGNOSIS — E785 Hyperlipidemia, unspecified: Secondary | ICD-10-CM | POA: Diagnosis present

## 2015-04-10 DIAGNOSIS — I4891 Unspecified atrial fibrillation: Secondary | ICD-10-CM

## 2015-04-10 DIAGNOSIS — E559 Vitamin D deficiency, unspecified: Secondary | ICD-10-CM | POA: Diagnosis present

## 2015-04-10 DIAGNOSIS — K219 Gastro-esophageal reflux disease without esophagitis: Secondary | ICD-10-CM | POA: Diagnosis present

## 2015-04-10 DIAGNOSIS — Z79899 Other long term (current) drug therapy: Secondary | ICD-10-CM | POA: Insufficient documentation

## 2015-04-10 DIAGNOSIS — I340 Nonrheumatic mitral (valve) insufficiency: Secondary | ICD-10-CM | POA: Diagnosis not present

## 2015-04-10 HISTORY — DX: Dizziness and giddiness: R42

## 2015-04-10 HISTORY — DX: Migraine, unspecified, not intractable, without status migrainosus: G43.909

## 2015-04-10 HISTORY — DX: Anxiety disorder, unspecified: F41.9

## 2015-04-10 HISTORY — DX: Depression, unspecified: F32.A

## 2015-04-10 HISTORY — DX: Personal history of other diseases of the digestive system: Z87.19

## 2015-04-10 HISTORY — DX: Major depressive disorder, single episode, unspecified: F32.9

## 2015-04-10 LAB — BASIC METABOLIC PANEL
Anion gap: 12 (ref 5–15)
BUN: 8 mg/dL (ref 6–20)
CHLORIDE: 105 mmol/L (ref 101–111)
CO2: 22 mmol/L (ref 22–32)
Calcium: 8.4 mg/dL — ABNORMAL LOW (ref 8.9–10.3)
Creatinine, Ser: 0.81 mg/dL (ref 0.44–1.00)
GFR calc Af Amer: >60 mL/min (ref >60)
GFR calc non Af Amer: >60 mL/min (ref >60)
GLUCOSE: 158 mg/dL — AB (ref 65–99)
Potassium: 3.3 mmol/L — ABNORMAL LOW (ref 3.5–5.1)
Sodium: 139 mmol/L (ref 135–145)

## 2015-04-10 LAB — TROPONIN I
TROPONIN I: 0.03 ng/mL (ref <0.031)
Troponin I: <0.03 ng/mL (ref <0.031)
Troponin I: 0.03 ng/mL (ref <0.031)

## 2015-04-10 LAB — CBC
HEMATOCRIT: 44.7 % (ref 36.0–46.0)
HEMOGLOBIN: 14.9 g/dL (ref 12.0–15.0)
MCH: 28.3 pg (ref 26.0–34.0)
MCHC: 33.3 g/dL (ref 30.0–36.0)
MCV: 85 fL (ref 78.0–100.0)
PLATELETS: 194 10*3/uL (ref 150–400)
RBC: 5.26 MIL/uL — AB (ref 3.87–5.11)
RDW: 15.5 % (ref 11.5–15.5)
WBC: 8.3 10*3/uL (ref 4.0–10.5)

## 2015-04-10 LAB — HEPARIN LEVEL (UNFRACTIONATED): Heparin Unfractionated: 0.54 IU/mL (ref 0.30–0.70)

## 2015-04-10 LAB — TSH: TSH: 1.483 u[IU]/mL (ref 0.350–4.500)

## 2015-04-10 LAB — MAGNESIUM: Magnesium: 2.1 mg/dL (ref 1.7–2.4)

## 2015-04-10 MED ORDER — ATENOLOL 25 MG PO TABS
25.0000 mg | ORAL_TABLET | Freq: Every day | ORAL | Status: DC
  Administered 2015-04-10 – 2015-04-11 (×2): 25 mg via ORAL
  Filled 2015-04-10 (×2): qty 1

## 2015-04-10 MED ORDER — VITAMIN D 1000 UNITS PO TABS
1000.0000 [IU] | ORAL_TABLET | Freq: Every day | ORAL | Status: DC
  Administered 2015-04-10 – 2015-04-11 (×2): 1000 [IU] via ORAL
  Filled 2015-04-10 (×2): qty 1

## 2015-04-10 MED ORDER — ADULT MULTIVITAMIN W/MINERALS CH
1.0000 | ORAL_TABLET | Freq: Every day | ORAL | Status: DC
  Administered 2015-04-11: 1 via ORAL
  Filled 2015-04-10: qty 1

## 2015-04-10 MED ORDER — DILTIAZEM LOAD VIA INFUSION
10.0000 mg | Freq: Once | INTRAVENOUS | Status: AC
Start: 2015-04-10 — End: 2015-04-10
  Administered 2015-04-10: 10 mg via INTRAVENOUS
  Filled 2015-04-10: qty 10

## 2015-04-10 MED ORDER — ONDANSETRON HCL 4 MG/2ML IJ SOLN: 4.0000 mg | Freq: Four times a day (QID) | INTRAMUSCULAR | Status: DC | PRN

## 2015-04-10 MED ORDER — POTASSIUM CHLORIDE CRYS ER 20 MEQ PO TBCR
40.0000 meq | EXTENDED_RELEASE_TABLET | Freq: Once | ORAL | Status: AC
  Administered 2015-04-10: 40 meq via ORAL
  Filled 2015-04-10: qty 2

## 2015-04-10 MED ORDER — ACETAMINOPHEN 325 MG PO TABS: 650.0000 mg | ORAL_TABLET | ORAL | Status: DC | PRN

## 2015-04-10 MED ORDER — ASPIRIN EC 81 MG PO TBEC: 81.0000 mg | DELAYED_RELEASE_TABLET | Freq: Every day | ORAL | Status: DC | PRN

## 2015-04-10 MED ORDER — PANTOPRAZOLE SODIUM 40 MG PO TBEC
80.0000 mg | DELAYED_RELEASE_TABLET | Freq: Every day | ORAL | Status: DC
  Administered 2015-04-10 – 2015-04-11 (×2): 80 mg via ORAL
  Filled 2015-04-10 (×2): qty 2

## 2015-04-10 MED ORDER — CALCIUM CITRATE-VITAMIN D 315-200 MG-UNIT PO TABS: 2.0000 | ORAL_TABLET | ORAL | Status: DC

## 2015-04-10 MED ORDER — MULTIVITAMINS PO CAPS: 1.0000 | ORAL_CAPSULE | Freq: Every evening | ORAL | Status: DC

## 2015-04-10 MED ORDER — DILTIAZEM HCL ER COATED BEADS 120 MG PO CP24
120.0000 mg | ORAL_CAPSULE | Freq: Every day | ORAL | Status: DC
  Administered 2015-04-10 – 2015-04-11 (×2): 120 mg via ORAL
  Filled 2015-04-10 (×2): qty 1

## 2015-04-10 MED ORDER — HEPARIN BOLUS VIA INFUSION
3000.0000 [IU] | Freq: Once | INTRAVENOUS | Status: AC
  Administered 2015-04-10: 3000 [IU] via INTRAVENOUS
  Filled 2015-04-10: qty 3000

## 2015-04-10 MED ORDER — ALPRAZOLAM 0.25 MG PO TABS
0.2500 mg | ORAL_TABLET | Freq: Two times a day (BID) | ORAL | Status: DC | PRN
  Administered 2015-04-10: 0.25 mg via ORAL
  Filled 2015-04-10: qty 1

## 2015-04-10 MED ORDER — HEPARIN (PORCINE) IN NACL 100-0.45 UNIT/ML-% IJ SOLN
900.0000 [IU]/h | INTRAMUSCULAR | Status: DC
  Administered 2015-04-10: 900 [IU]/h via INTRAVENOUS
  Filled 2015-04-10 (×2): qty 250

## 2015-04-10 MED ORDER — ACETAMINOPHEN 500 MG PO TABS
1000.0000 mg | ORAL_TABLET | Freq: Once | ORAL | Status: AC
  Administered 2015-04-10: 1000 mg via ORAL
  Filled 2015-04-10: qty 2

## 2015-04-10 MED ORDER — SIMVASTATIN 40 MG PO TABS: 40.0000 mg | ORAL_TABLET | Freq: Every evening | ORAL | Status: DC

## 2015-04-10 MED ORDER — CALCIUM CITRATE-VITAMIN D 500-400 MG-UNIT PO CHEW
2.0000 | CHEWABLE_TABLET | Freq: Every day | ORAL | Status: DC
  Filled 2015-04-10: qty 2

## 2015-04-10 MED ORDER — DILTIAZEM HCL 100 MG IV SOLR
5.0000 mg/h | INTRAVENOUS | Status: DC
  Administered 2015-04-10: 12.5 mg/h via INTRAVENOUS
  Administered 2015-04-10 (×2): 15 mg/h via INTRAVENOUS
  Administered 2015-04-10: 5 mg/h via INTRAVENOUS

## 2015-04-10 NOTE — H&P (Signed)
Patient ID: Norma Barajas MRN: [id number], DOB/AGE: 76-Mar-1942   Admit date: 04/10/2015   Primary Physician: Sharion Balloon, FNP Primary Cardiologist: Dr. Jenkins Rouge  HPI:   Norma Barajas is a 74 y.o. female with a history of non obstructive CAD, mild mitral regurgitation, paroxysmal atrial flutter, HTN, HL, anxiety who brought to [facility] by EMS for afib with RVR.  Cardiac catheterization in 2006 showed non obstructive CAD. Had normal Lexiscan in 07/2013. Last seen by Dr [name] in June 2014, at that time in NSR. Intolerance to amiodarone. Echo 09/2012 LV EF of 60-65%, no WM abnormality, grade 1 DD, mild mitral reg, mild tri regur, pulmonary artery peak pressure 31mmg Hg, lipomatous atrial septum. She was in [place] up until few weeks ago when she noticed intermittent exertional upper chest pressure with occasional radiation to left jaw. She fell early April of 2016 on her left shoulder and then she had a right Tibia/Fibula fracture surgery.   This morning she woke up with a upset stomach without nausea or vomiting. She took laxative yesterday for constipation. Then she developed palpitation, generalized discomfort and diaphoresis. She called EMS as she thought she back in Afib. She received SL nitro by EMS for Left chest tightness with mild SOB. Currently asymptomatic. She denies syncope, diarrhea, chills, fever, PND, orthopnea or LE swelling. She is non smoker and non drinker.   In ED her initial trop normal.  Creatinine normal. K of 3.3. Magnesium 2.1. HgbA1c of 5.36. TSH normal.  EKG showed A.fib with RVR at rate of 158 and PVCs, no acute abnormality. She was given ASA and dilt gtt.    Problem List  Past Medical History  Diagnosis Date  . Essential hypertension, benign   . Mitral regurgitation     Mild 09/2012  . Erosive esophagitis   . GERD (gastroesophageal reflux disease)   . Hypercholesterolemia   . Coronary atherosclerosis of native coronary artery     Nonobstructive CAD 10/2005    . Paroxysmal atrial fibrillation   . Diastolic dysfunction     Grade 1. Ejection fraction 60-65%.  . Complication of anesthesia   . PONV (postoperative nausea and vomiting)   . Arthritis   . Osteoporosis   . Tibial fracture 2013    right  . Cataract     Past Surgical History  Procedure Laterality Date  . Abdominal hysterectomy    . Prolapsed uterine fibroid ligation  2015  . Cataract extraction    . Steel plate in the leg Right     broke tibia and fibula after falling down stairs  . Esophagogastroduodenoscopy  2001    Dr. [name]: erosive esophagitis, esophageal stricture, duodenitis, s/p Savary dilation  . Colonoscopy  2008    Dr. [name]: internal hemorrhoids   . Esophagogastroduodenoscopy (egd) with esophageal dilation  11/15/2012    [id number] web was found & MOST LIKELY CAUSE FOR DYAPHAGIA/Polyp was found in the gastric body and gastric fundus/ gastritis on bx  . Bravo ph study  11/15/2012    Procedure: BRAVO [place] STUDY;  Surgeon: [name], MD;  [facility];  Service: Endoscopy;;  . Flexible sigmoidoscopy N/A 08/22/2014    Procedure: FLEXIBLE SIGMOIDOSCOPY;  Surgeon: [name], MD;  [facility];  Service: Endoscopy;  Laterality: N/A;  830  . Hemorrhoid banding N/A 08/22/2014    Procedure: HEMORRHOID BANDING;  Surgeon: [name], MD;  [facility];  Service: Endoscopy;  Laterality: N/A;  .  Eye surgery       Allergies  Allergies  Allergen Reactions  . Crestor [Rosuvastatin Calcium] Other (See Comments)    weakness  . Iodine     ivp dye  . Iohexol   . Morphine Nausea Only  . Risedronate Sodium Other (See Comments)    ACTONEL REACTION: reflux  . Triamterene-Hctz Other (See Comments)    weakness  . Atorvastatin Other (See Comments)    Myalgias      Home Medications  Prior to Admission medications   Medication Sig Start Date End Date Taking? Authorizing Provider  ALPRAZolam (XANAX) 0.25 MG tablet TAKE 1 TABLET BY  MOUTH TWICE A DAY AS NEEDED FOR ANXIETY, NERVES, OR SLEEP 02/28/15  Yes Sharion Balloon, FNP  amLODipine (NORVASC) 10 MG tablet Take 0.5 tablets (5 mg total) by mouth daily after supper. 11/27/14  Yes Sharion Balloon, FNP  aspirin EC 81 MG tablet Take 81 mg by mouth daily as needed (when she feels like she needs it).    Yes Historical Provider, MD  atenolol (TENORMIN) 50 MG tablet Take 0.5 tablets (25 mg total) by mouth daily. 03/31/15  Yes Sharion Balloon, FNP  calcium citrate-vitamin D (CITRACAL+D) 315-200 MG-UNIT per tablet Take 2 tablets by mouth every morning. 04/04/15  Yes Tammy Eckard, PHARMD  cholecalciferol (VITAMIN D) 1000 UNITS tablet Take 1,000 Units by mouth daily.   Yes Historical Provider, MD  Multiple Vitamin (MULTIVITAMIN) capsule Take 1 capsule by mouth every evening.   Yes Historical Provider, MD  omeprazole (PRILOSEC) 20 MG capsule Take 1 capsule (20 mg total) by mouth 2 (two) times daily. Patient taking differently: Take 20 mg by mouth daily.  11/27/14  Yes Sharion Balloon, FNP  simvastatin (ZOCOR) 40 MG tablet Take 1 tablet (40 mg total) by mouth every evening. 11/27/14  Yes Sharion Balloon, FNP    Family History  Family History  Problem Relation Age of Onset  . Colon cancer Mother     Diagnosed at age 10  . Heart disease Mother   . Osteoporosis Mother   . Hip fracture Mother   . Stroke Sister   . Diabetes Sister   . Cancer - Other Sister   . Osteoporosis Sister   . Arthritis Sister   . Cancer Sister     uterine  . Stroke Sister   . Heart disease Father   . Hyperlipidemia Brother   . Hypertension Brother   . Stroke Brother   . Diabetes Son    Family Status  Relation Status Death Age  . Mother Deceased 74  . Sister Deceased   . Sister Deceased   . Sister Deceased   . Father Deceased 62    suicide  . Brother Alive   . Brother Deceased   . Sister Deceased   . Son Alive      Social History  History   Social History  . Marital Status: Married     Spouse Name: N/A  . Number of Children: N/A  . Years of Education: N/A   Occupational History  . Retired     Tourist information centre manager   Social History Main Topics  . Smoking status: Never Smoker   . Smokeless tobacco: Never Used     Comment: Never smoked  . Alcohol Use: No  . Drug Use: No  . Sexual Activity: Yes   Other Topics Concern  . Not on file   Social History Narrative   Married   No regular  exercise     All other systems reviewed and are otherwise negative except as noted above.  Physical Exam  Blood pressure 103/58, pulse 79, temperature 98.3 F (36.8 C), temperature source Oral, resp. rate 21, SpO2 94 %.  General: Pleasant, NAD Psych: Normal affect. Neuro: Alert and oriented X 3. Moves all extremities spontaneously. HEENT: Normal  Neck: Supple without bruits or JVD. Lungs:  Resp regular and unlabored, CTA. Heart: irregularly irregular with tachycardia, no s3, s4, or murmurs. Abdomen: Soft, non-tender, non-distended, BS + x 4.  Extremities: No clubbing, cyanosis or edema. DP/PT/Radials 2+ and equal bilaterally.  Labs   Recent Labs  04/10/15 0807  TROPONINI <0.03   Lab Results  Component Value Date   WBC 8.3 04/10/2015   HGB 14.9 04/10/2015   HCT 44.7 04/10/2015   MCV 85.0 04/10/2015   PLT 194 04/10/2015     Recent Labs Lab 04/10/15 0807  NA 139  K 3.3*  CL 105  CO2 22  BUN 8  CREATININE 0.81  CALCIUM 8.4*  GLUCOSE 158*       Radiology/Studies  No results found.  ECG  Atrial fibrillation with rapid V-rate with rate of 158. Ventricular premature complex ST depression, probably rate related  Echo 09/2012 LV EF: 60% -  65%  ------------------------------------------------------------ Indications:   Atrial fibrillation - currently SR 427.31.  ------------------------------------------------------------ History:  PMH:  Mitral valve disease. PMH: hyperlipidemia, GERD, MR, AF RVR Risk  factors: Hypertension.  ------------------------------------------------------------ Study Conclusions  - Left ventricle: The cavity size was normal. Wall thickness was normal. Systolic function was normal. The estimated ejection fraction was in the range of 60% to 65%. Wall motion was normal; there were no regional wall motion abnormalities. Doppler parameters are consistent with abnormal left ventricular relaxation (grade 1 diastolic dysfunction). - Mitral valve: Mild regurgitation. - Left atrium: The atrium was mildly dilated. - Right atrium: The Eustachian valve appeared prominant. - Atrial septum: There was increased thickness of the septum, consistent with lipomatous hypertrophy. No defect or patent foramen ovale was identified. - Tricuspid valve: Mild regurgitation. - Pulmonary arteries: PA peak pressure: 72mm Hg (S). - Pericardium, extracardiac: There was no pericardial effusion.  ASSESSMENT AND PLAN Principal Problem:   Atrial fibrillation with rapid ventricular response Active Problems:   Hyperlipidemia   GAD (generalized anxiety disorder)   MITRAL REGURGITATION   Essential hypertension   GERD   Coronary atherosclerosis of native coronary artery   Vitamin D deficiency   1. Afib with RVR - Was maintaining NSR up until this morning. Noted in afib with rate of 158. Started on cardizem gtt. She spontaneously converted to NSR with rate of 80-90 when I was in a patient's room. We will give Cardizem CD 120 tonight and discontinue dilt gtt.  CHADSVASc score of at least 4 (age 38, female 1, HTN 1). She needs anticoagulation. Will start heparin gtt per pharmacy with plan to start Eliquis in morning.  - Will get echo  2. Atypical chest pain - She felt on left shoulder early April, since then she had intermittent L upper chest pressure with occasional radiation to left jaw. Received SL nitro by EMS. Asymptomatic now.  --Troponin x1 negative, ECG with no acute  ST or TW changes --Will admit to telemtry for observation --Cycle Troponin and serial ECGs - If trop abnormal, consider non invasive testing  3. CAD -  Cardiac catheterization in 2006 showed non obstructive CAD. Had normal Lexiscan in 07/2013. As above.  4. Hypokalemia - K  of 3.3. Will give supplement.   5. HTN - stable and well controlled. Discontinue amlodipine. Start Cardizem CD 120mg . Continue atenolol.  6. HL - Will get lipid panel. Continue statin   Signed, Leanor Kail, PA-C 04/10/2015, 1:26 PM Pager [phone]  I have personally seen and examined this patient with Vin Bhagat, PA-C. I agree with the assessment and plan as outlined above. Will admit to tele. She is now back in sinus. Will need long term anti-coagulation. Will changed IV Cardizem to po Cardizem. Echo later today. Heparin drip tonight and probably start Eliquis in the am.   Morris Markham 04/10/2015 1:55 PM

## 2015-04-10 NOTE — ED Notes (Signed)
Cardiology at bedside.

## 2015-04-10 NOTE — Progress Notes (Signed)
ANTICOAGULATION CONSULT NOTE - Initial Consult  Pharmacy Consult for heparin Indication: atrial fibrillation  Allergies  Allergen Reactions  . Crestor [Rosuvastatin Calcium] Other (See Comments)    weakness  . Iodine     ivp dye  . Iohexol   . Morphine Nausea Only  . Risedronate Sodium Other (See Comments)    ACTONEL REACTION: reflux  . Triamterene-Hctz Other (See Comments)    weakness  . Atorvastatin Other (See Comments)    Myalgias     Patient Measurements:   Heparin Dosing Weight: 61kg  Vital Signs: Temp: 98.3 F (36.8 C) (06/02 0800) Temp Source: Oral (06/02 0800) BP: 113/69 mmHg (06/02 1415) Pulse Rate: 78 (06/02 1415)  Labs:  Recent Labs  04/10/15 0807  HGB 14.9  HCT 44.7  PLT 194  CREATININE 0.81  TROPONINI <0.03    Estimated Creatinine Clearance: 50.3 mL/min (by C-G formula based on Cr of 0.81).   Medical History: Past Medical History  Diagnosis Date  . Essential hypertension, benign   . Mitral regurgitation     Mild 09/2012  . Erosive esophagitis   . GERD (gastroesophageal reflux disease)   . Hypercholesterolemia   . Coronary atherosclerosis of native coronary artery     Nonobstructive CAD 10/2005  . Paroxysmal atrial fibrillation   . Diastolic dysfunction     Grade 1. Ejection fraction 60-65%.  . Complication of anesthesia   . PONV (postoperative nausea and vomiting)   . Arthritis   . Osteoporosis   . Tibial fracture 2013    right  . Cataract     Assessment: 44 yof to ED w/ afib w/ RVR, palpitations, diaphoresis. Pharmacy consulted to dose IV heparin for PAF. CBC wnl. No bleed documented. Plan is to start Eliquis in the AM 6/3 per cards.  Goal of Therapy:  Heparin level 0.3-0.7 units/ml Monitor platelets by anticoagulation protocol: Yes   Plan:  Heparin 3000 unit bolus Heparin IV @900  units/hr Plan to start Eliquis in AM 6/3 per cards 8h HL Daily HL/CBC Mon s/sx bleed  Elicia Lamp, PharmD Clinical Pharmacist -  Resident Pager [phone] 04/10/2015 2:26 PM

## 2015-04-10 NOTE — ED Notes (Signed)
Pt placed in gown and in bed. Pt monitored by pulse ox, bp cuff, and 12-lead. 

## 2015-04-10 NOTE — ED Provider Notes (Signed)
CSN: [id number]     Arrival date & time 04/10/15  1829 History   First MD Initiated Contact with Patient 04/10/15 0750     Chief Complaint  Patient presents with  . Tachycardia  . Chest Pain    HPI  Patient presents with concern of palpitations, general discomfort, diaphoresis per Symptoms began within the past 3 hours, soon after the patient awoke. She was generally well aside from increased stress level prior to the onset of symptoms. Since onset alleviating or exacerbating factors. No actual pain, though there is generalized discomfort throughout the thorax. Mild associated dyspnea, no syncope, vomiting, diarrhea, fever, chills. Patient states that she take all medication as directed. She noticed one prior similar episode about one year ago.   Past Medical History  Diagnosis Date  . Essential hypertension, benign   . Mitral regurgitation     Mild 09/2012  . Erosive esophagitis   . GERD (gastroesophageal reflux disease)   . Hypercholesterolemia   . Coronary atherosclerosis of native coronary artery     Nonobstructive CAD 10/2005  . Paroxysmal atrial fibrillation   . Diastolic dysfunction     Grade 1. Ejection fraction 60-65%.  . Complication of anesthesia   . PONV (postoperative nausea and vomiting)   . Arthritis   . Osteoporosis   . Tibial fracture 2013    right  . Cataract    Past Surgical History  Procedure Laterality Date  . Abdominal hysterectomy    . Prolapsed uterine fibroid ligation  2015  . Cataract extraction    . Steel plate in the leg Right     broke tibia and fibula after falling down stairs  . Esophagogastroduodenoscopy  2001    Dr. [name]: erosive esophagitis, esophageal stricture, duodenitis, s/p Savary dilation  . Colonoscopy  2008    Dr. [name]: internal hemorrhoids   . Esophagogastroduodenoscopy (egd) with esophageal dilation  11/15/2012    HBZ:JIRCVELFYB web was found & MOST LIKELY CAUSE FOR DYAPHAGIA/Polyp was found in the gastric body and  gastric fundus/ gastritis on bx  . Bravo ph study  11/15/2012    Procedure: BRAVO [place] STUDY;  Surgeon: [name], MD;  Location: [facility];  Service: Endoscopy;;  . Flexible sigmoidoscopy N/A 08/22/2014    Procedure: FLEXIBLE SIGMOIDOSCOPY;  Surgeon: [name], MD;  Location: [facility];  Service: Endoscopy;  Laterality: N/A;  830  . Hemorrhoid banding N/A 08/22/2014    Procedure: HEMORRHOID BANDING;  Surgeon: [name], MD;  Location: [facility];  Service: Endoscopy;  Laterality: N/A;  . Eye surgery     Family History  Problem Relation Age of Onset  . Colon cancer Mother     Diagnosed at age 58  . Heart disease Mother   . Osteoporosis Mother   . Hip fracture Mother   . Stroke Sister   . Diabetes Sister   . Cancer - Other Sister   . Osteoporosis Sister   . Arthritis Sister   . Cancer Sister     uterine  . Stroke Sister   . Heart disease Father   . Hyperlipidemia Brother   . Hypertension Brother   . Stroke Brother   . Diabetes Son    History  Substance Use Topics  . Smoking status: Never Smoker   . Smokeless tobacco: Never Used     Comment: Never smoked  . Alcohol Use: No   OB History    No data available     Review  of Systems  Constitutional:       Per HPI, otherwise negative  HENT:       Per HPI, otherwise negative  Respiratory:       Per HPI, otherwise negative  Cardiovascular:       Per HPI, otherwise negative  Gastrointestinal: Negative for vomiting.  Endocrine:       Negative aside from HPI  Genitourinary:       Neg aside from HPI   Musculoskeletal:       Per HPI, otherwise negative  Skin:       History of present illness  Neurological: Negative for syncope.      Allergies  Crestor; Iodine; Iohexol; Morphine; Risedronate sodium; Triamterene-hctz; and Atorvastatin  Home Medications   Prior to Admission medications   Medication Sig Start Date End Date Taking? Authorizing Provider  ALPRAZolam (XANAX) 0.25 MG tablet TAKE 1  TABLET BY MOUTH TWICE A DAY AS NEEDED FOR ANXIETY, NERVES, OR SLEEP 02/28/15   Sharion Balloon, FNP  amLODipine (NORVASC) 10 MG tablet Take 0.5 tablets (5 mg total) by mouth daily after supper. 11/27/14   Sharion Balloon, FNP  aspirin EC 81 MG tablet Take 81 mg by mouth daily as needed (when she feels like she needs it).     Historical Provider, MD  atenolol (TENORMIN) 50 MG tablet Take 0.5 tablets (25 mg total) by mouth daily. 03/31/15   Sharion Balloon, FNP  calcium citrate-vitamin D (CITRACAL+D) 315-200 MG-UNIT per tablet Take 2 tablets by mouth every morning. 04/04/15   Tammy Eckard, PHARMD  cholecalciferol (VITAMIN D) 1000 UNITS tablet Take 1,000 Units by mouth daily.    Historical Provider, MD  Multiple Vitamin (MULTIVITAMIN) capsule Take 1 capsule by mouth every evening.    Historical Provider, MD  omeprazole (PRILOSEC) 20 MG capsule Take 1 capsule (20 mg total) by mouth 2 (two) times daily. Patient taking differently: Take 20 mg by mouth daily.  11/27/14   Sharion Balloon, FNP  simvastatin (ZOCOR) 40 MG tablet Take 1 tablet (40 mg total) by mouth every evening. 11/27/14   Sharion Balloon, FNP   There were no vitals taken for this visit. Physical Exam  Constitutional: She is oriented to person, place, and time. She appears well-developed and well-nourished.  HENT:  Head: Normocephalic and atraumatic.  Eyes: Conjunctivae and EOM are normal.  Cardiovascular: An irregularly irregular rhythm present. Tachycardia present.   Pulmonary/Chest: Effort normal and breath sounds normal. No stridor. No respiratory distress.  Abdominal: She exhibits no distension.  Musculoskeletal: She exhibits no edema.  Neurological: She is alert and oriented to person, place, and time. No cranial nerve deficit.  Skin: Skin is warm. She is diaphoretic.  Psychiatric: She has a normal mood and affect.  Nursing note and vitals reviewed.  On arrival I discussed patient's case with EMS providers. History the patient  called due to the palpitations. On arrival the patient had normal blood pressure, but was tachycardic, with irregular rhythm. With concern for possible ST wave changes, patient did not received diltiazem in route She did receive nitroglycerin, subsequent had substantial decrease in blood pressure, though this corrected while in route.  ED Course  Procedures (including critical care time) Labs Review Labs Reviewed  CBC - Abnormal; Notable for the following:    RBC 5.26 (*)    All other components within normal limits  BASIC METABOLIC PANEL - Abnormal; Notable for the following:    Potassium 3.3 (*)    Glucose,  Bld 158 (*)    Calcium 8.4 (*)    All other components within normal limits  MAGNESIUM  TROPONIN I  TSH   Review demonstrates a history of paroxysmal atrial fibrillation, previously treated with amiodarone, the patient was intolerant of the medication.    EKG Interpretation   Date/Time:  Thursday April 10 2015 07:55:10 EDT Ventricular Rate:  158 PR Interval:    QRS Duration: 82 QT Interval:  291 QTC Calculation: 472 R Axis:   -8 Text Interpretation:  Atrial fibrillation with rapid V-rate Ventricular  premature complex ST depression, probably rate related Atrial fibrillation  with rapid ventricular response ST-t wave abnormality Abnormal ekg  Confirmed by Carmin Muskrat  MD ([id number]) on 04/10/2015 8:01:39 AM     Cardiac 148 for fibrillation, rapid ventricular response abnormal Pulse ox 99% room air normal  10:05 AM HR 120's.  No new complaints  11:00 AM Heart rate 90s  CHADSVASC2 - 4  MDM   There is presents with palpitations, dyspnea, diaphoresis.  Patient is initially found to be tachycardic, with irregular rhythm consistent with atrial fibrillation, rapid ventricular response. Patient required initiation of titrated diltiazem, and after hours of therapy, monitoring, the patient's heart rate decreased below 100. Patient's troponin was unremarkable, and she  essentially had no chest pain throughout, reassuring for the low suspicion of ongoing coronary ischemia. However, given the patient's age fibrillation, rapid ventricular response, elevated risk profile, she required admission for further evaluation and management.  CRITICAL CARE Performed by: Carmin Muskrat Total critical care time: 35 Critical care time was exclusive of separately billable procedures and treating other patients. Critical care was necessary to treat or prevent imminent or life-threatening deterioration. Critical care was time spent personally by me on the following activities: development of treatment plan with patient and/or surrogate as well as nursing, discussions with consultants, evaluation of patient's response to treatment, examination of patient, obtaining history from patient or surrogate, ordering and performing treatments and interventions, ordering and review of laboratory studies, ordering and review of radiographic studies, pulse oximetry and re-evaluation of patient's condition.    Carmin Muskrat, MD 04/10/15 [phone]

## 2015-04-10 NOTE — ED Notes (Signed)
[facility]- pt coming from home with rapid heart rate and chest tightness. Pt reports pain began around 0700 and is described as tightness. Pt reports similar incident last year. No distress noted.

## 2015-04-11 ENCOUNTER — Encounter (HOSPITAL_COMMUNITY): Payer: Self-pay | Admitting: General Practice

## 2015-04-11 ENCOUNTER — Observation Stay (HOSPITAL_COMMUNITY): Payer: Commercial Managed Care - HMO

## 2015-04-11 DIAGNOSIS — E780 Pure hypercholesterolemia: Secondary | ICD-10-CM | POA: Diagnosis not present

## 2015-04-11 DIAGNOSIS — M1990 Unspecified osteoarthritis, unspecified site: Secondary | ICD-10-CM | POA: Diagnosis not present

## 2015-04-11 DIAGNOSIS — I2510 Atherosclerotic heart disease of native coronary artery without angina pectoris: Secondary | ICD-10-CM | POA: Diagnosis not present

## 2015-04-11 DIAGNOSIS — K208 Other esophagitis: Secondary | ICD-10-CM | POA: Diagnosis not present

## 2015-04-11 DIAGNOSIS — I340 Nonrheumatic mitral (valve) insufficiency: Secondary | ICD-10-CM | POA: Diagnosis not present

## 2015-04-11 DIAGNOSIS — T45515A Adverse effect of anticoagulants, initial encounter: Secondary | ICD-10-CM | POA: Insufficient documentation

## 2015-04-11 DIAGNOSIS — E785 Hyperlipidemia, unspecified: Secondary | ICD-10-CM

## 2015-04-11 DIAGNOSIS — I10 Essential (primary) hypertension: Secondary | ICD-10-CM | POA: Diagnosis not present

## 2015-04-11 DIAGNOSIS — I4891 Unspecified atrial fibrillation: Secondary | ICD-10-CM

## 2015-04-11 DIAGNOSIS — I480 Paroxysmal atrial fibrillation: Secondary | ICD-10-CM | POA: Diagnosis not present

## 2015-04-11 DIAGNOSIS — R61 Generalized hyperhidrosis: Secondary | ICD-10-CM | POA: Diagnosis not present

## 2015-04-11 DIAGNOSIS — K219 Gastro-esophageal reflux disease without esophagitis: Secondary | ICD-10-CM | POA: Diagnosis not present

## 2015-04-11 LAB — TROPONIN I: TROPONIN I: 0.03 ng/mL (ref <0.031)

## 2015-04-11 LAB — CBC
HEMATOCRIT: 39.5 % (ref 36.0–46.0)
Hemoglobin: 13 g/dL (ref 12.0–15.0)
MCH: 28.1 pg (ref 26.0–34.0)
MCHC: 32.9 g/dL (ref 30.0–36.0)
MCV: 85.3 fL (ref 78.0–100.0)
Platelets: 205 10*3/uL (ref 150–400)
RBC: 4.63 MIL/uL (ref 3.87–5.11)
RDW: 15.9 % — ABNORMAL HIGH (ref 11.5–15.5)
WBC: 8 10*3/uL (ref 4.0–10.5)

## 2015-04-11 LAB — LIPID PANEL
Cholesterol: 162 mg/dL (ref 0–200)
HDL: 43 mg/dL (ref >40)
LDL CALC: 93 mg/dL (ref 0–99)
Total CHOL/HDL Ratio: 3.8 RATIO
Triglycerides: 131 mg/dL (ref <150)
VLDL: 26 mg/dL (ref 0–40)

## 2015-04-11 LAB — BASIC METABOLIC PANEL
Anion gap: 11 (ref 5–15)
BUN: 9 mg/dL (ref 6–20)
CO2: 24 mmol/L (ref 22–32)
CREATININE: 0.91 mg/dL (ref 0.44–1.00)
Calcium: 8.4 mg/dL — ABNORMAL LOW (ref 8.9–10.3)
Chloride: 102 mmol/L (ref 101–111)
GFR calc Af Amer: >60 mL/min (ref >60)
GFR calc non Af Amer: 60 mL/min — ABNORMAL LOW (ref >60)
GLUCOSE: 93 mg/dL (ref 65–99)
POTASSIUM: 4 mmol/L (ref 3.5–5.1)
Sodium: 137 mmol/L (ref 135–145)

## 2015-04-11 LAB — HEPARIN LEVEL (UNFRACTIONATED): Heparin Unfractionated: 0.46 IU/mL (ref 0.30–0.70)

## 2015-04-11 MED ORDER — CALCIUM CITRATE 950 (200 CA) MG PO TABS
200.0000 mg | ORAL_TABLET | Freq: Every day | ORAL | Status: DC
  Filled 2015-04-11: qty 1

## 2015-04-11 MED ORDER — DILTIAZEM HCL ER COATED BEADS 120 MG PO CP24: 120.0000 mg | ORAL_CAPSULE | Freq: Every day | ORAL | Status: DC

## 2015-04-11 MED ORDER — ALPRAZOLAM 0.25 MG PO TABS
0.2500 mg | ORAL_TABLET | Freq: Once | ORAL | Status: AC
  Administered 2015-04-11: 0.25 mg via ORAL
  Filled 2015-04-11: qty 1

## 2015-04-11 MED ORDER — APIXABAN 5 MG PO TABS
5.0000 mg | ORAL_TABLET | Freq: Two times a day (BID) | ORAL | Status: DC
  Administered 2015-04-11: 5 mg via ORAL
  Filled 2015-04-11: qty 2

## 2015-04-11 MED ORDER — APIXABAN 5 MG PO TABS: 5.0000 mg | ORAL_TABLET | Freq: Two times a day (BID) | ORAL | Status: DC

## 2015-04-11 NOTE — Progress Notes (Signed)
ANTICOAGULATION CONSULT NOTE - Follow-up Consult  Pharmacy Consult for heparin Indication: atrial fibrillation  Allergies  Allergen Reactions  . Crestor [Rosuvastatin Calcium] Other (See Comments)    weakness  . Iodine     ivp dye  . Iohexol   . Morphine Nausea Only  . Risedronate Sodium Other (See Comments)    ACTONEL REACTION: reflux  . Triamterene-Hctz Other (See Comments)    weakness  . Atorvastatin Other (See Comments)    Myalgias     Patient Measurements: Height: 5\' 1"  (154.9 cm) Weight: 134 lb 8 oz (61.009 kg) IBW/kg (Calculated) : 47.8 Heparin Dosing Weight: 61kg  Vital Signs: Temp: 98 F (36.7 C) (06/02 2036) Temp Source: Oral (06/02 2036) BP: 122/60 mmHg (06/02 2036) Pulse Rate: 63 (06/02 2036)  Labs:  Recent Labs  04/10/15 0807 04/10/15 1647 04/10/15 2130 04/10/15 2145  HGB 14.9  --   --   --   HCT 44.7  --   --   --   PLT 194  --   --   --   HEPARINUNFRC  --   --   --  0.54  CREATININE 0.81  --   --   --   TROPONINI <0.03 0.03 0.03  --     Estimated Creatinine Clearance: 49.5 mL/min (by C-G formula based on Cr of 0.81).   Assessment: Norma Barajas on heparin for PAF. Heparin level therapeutic on 900 untis/hr. Plan is to start Eliquis in the AM 6/3 per cards.  Goal of Therapy:  Heparin level 0.3-0.7 units/ml Monitor platelets by anticoagulation protocol: Yes   Plan:  Continue Heparin IV @900  units/hr Plan to start Eliquis in AM 6/3 per cards Daily HL/CBC Mon s/sx bleed  Sherlon Handing, PharmD, BCPS Clinical pharmacist, pager [phone] 04/11/2015 12:12 AM

## 2015-04-11 NOTE — Progress Notes (Addendum)
Patient Name: Norma Barajas Date of Encounter: 04/11/2015  Principal Problem:   Atrial fibrillation with rapid ventricular response Active Problems:   Hyperlipidemia   GAD (generalized anxiety disorder)   MITRAL REGURGITATION   Essential hypertension   GERD   Coronary atherosclerosis of native coronary artery   Vitamin D deficiency  SUBJECTIVE  Denies chest pain, SOB or palpitation. She is not sure if she can afford Eliquis.   CURRENT MEDS . atenolol  25 mg Oral Daily  . calcium citrate  200 mg of elemental calcium Oral Daily  . cholecalciferol  1,000 Units Oral Daily  . diltiazem  120 mg Oral Daily  . multivitamin with minerals  1 tablet Oral Daily  . pantoprazole  80 mg Oral Daily  . simvastatin  40 mg Oral QPM    OBJECTIVE  Filed Vitals:   04/10/15 1652 04/10/15 2036 04/11/15 0500 04/11/15 0855  BP: 123/62 122/60 127/64 114/62  Pulse: 76 63 65   Temp:  98 F (36.7 C) 98 F (36.7 C)   TempSrc:  Oral    Resp:  16 18   Height:      Weight:   135 lb 6.4 oz (61.417 kg)   SpO2:  96% 95%     Intake/Output Summary (Last 24 hours) at 04/11/15 0951 Last data filed at 04/10/15 1548  Gross per 24 hour  Intake      0 ml  Output    350 ml  Net   -350 ml   Filed Weights   04/10/15 1439 04/11/15 0500  Weight: 134 lb 8 oz (61.009 kg) 135 lb 6.4 oz (61.417 kg)    PHYSICAL EXAM  General: Pleasant, NAD. Neuro: Alert and oriented X 3. Moves all extremities spontaneously. Psych: Normal affect. HEENT:  Normal  Neck: Supple without bruits or JVD. Lungs:  Resp regular and unlabored. R side basilar rales.  Heart: RRR no s3, s4, or murmurs. Abdomen: Soft, non-tender, non-distended, BS + x 4.  Extremities: No clubbing, cyanosis or edema. DP/PT/Radials 2+ and equal bilaterally.  Accessory Clinical Findings  CBC  Recent Labs  04/10/15 0807 04/11/15 0410  WBC 8.3 8.0  HGB 14.9 13.0  HCT 44.7 39.5  MCV 85.0 85.3  PLT 194 409   Basic Metabolic Panel  Recent  Labs  04/10/15 0807 04/11/15 0410  NA 139 137  K 3.3* 4.0  CL 105 102  CO2 22 24  GLUCOSE 158* 93  BUN 8 9  CREATININE 0.81 0.91  CALCIUM 8.4* 8.4*  MG 2.1  --     Cardiac Enzymes  Recent Labs  04/10/15 1647 04/10/15 2130 04/11/15 0226  TROPONINI 0.03 0.03 0.03      Recent Labs  04/11/15 0410  CHOL 162  HDL 43  LDLCALC 93  TRIG 131  CHOLHDL 3.8   Thyroid Function Tests  Recent Labs  04/10/15 0807  TSH 1.483    TELE  NSR with rate of 60-70s. Occasional PVCs. Brief runs of HR of 140s at 7:50am this morning.   ASSESSMENT AND PLAN   1. Afib with RVR - She spontaneously converted to NSR in ED. Discontinued dilt gtt and started on Cardizem CD 120mg . CHADSVASc score of at least 4 (age 4, female 1, HTN 1). On heparin gtt per pharmacy. Plan is to start Eliquis, however she thinks she can not afford copay. Will get care manager consult. If she can't affort Eliquis, plan to start on Coumadin.  - Brief runs of HR of  140s at 7:50am this morning. ?afib vs sinus tach. At that time, she is trying get her food tray. NSR on tele now. Continue to monitor.  -Pending echo  2. Atypical chest pain/ hx of CAD --Troponin x3  negative, She is asymptomatic.  - Continue current regimen - Cardiac catheterization in 2006 showed non obstructive CAD. Had normal Lexiscan in 07/2013.    4. Hypokalemia - Resolved with supplement.   5. HTN - stable and well controlled. Discontinued amlodipine. Continue atenolol and Cardizem CD 120mg .   6. HL - 04/11/2015: Cholesterol, Total 162; HDL-C 43; LDL (calc) 93; Triglycerides 131; VLDL 26Continue current dose of statin    Dispo: Patient of Dr. [name]. She is planning to transfer care with Dr. Percival Spanish @ [place]. If not then planing to f/u at [facility] office. Care manager consult for Eliquis.   Signed, Leanor Kail PA-C Pager [phone]   The patient was seen, examined and discussed with Vayden Weinand PA-C and I agree with the  above.   74 year old female with a history of non obstructive CAD, mild mitral regurgitation, paroxysmal atrial flutter, HTN, HL, anxiety who brought to [facility] by EMS for afib with RVR. Spontaneously cardioverted to SR in the ER, remain in [place], she will be started on coumadine, we are awaiting echo results, she has h/o and will definitely need bridging. We will talk to case manager about discharge options as she doesn't want to use Eliquis because of cost concerns. BP is controlled. We will arrange for an outpatient follow up.   Dorothy Spark, MD, [facility] 04/11/2015

## 2015-04-11 NOTE — Care Management Note (Addendum)
Case Management Note  Patient Details  Name: Norma Barajas MRN: [id number] Date of Birth: 10-Dec-1940  Subjective/Objective:   Pt admitted for A Fib RVR. Plan to transition to Eliquis. Benefits check in process and will make pt aware once completed.          Action/Plan: CM will provide pt with 30 day free card. Pt will need Rx for 30 day free no refills and then the original Rx with refills. [facility] has medication Eliquis available. CM did speak with pt and she stated she may not take medication due to cost being too expensive. No further needs at this time. Will f/u.   1349 04-11-15  Eliquis   Pt copay will be $47-prior auth not required     Expected Discharge Date:                  Expected Discharge Plan:  Home/Self Care  In-House Referral:     Discharge planning Services  CM Consult, Medication Assistance  Post Acute Care Choice:    Choice offered to:     DME Arranged:    DME Agency:     HH Arranged:    HH Agency:     Status of Service:  In process, will continue to follow  Medicare Important Message Given:    Date Medicare IM Given:    Medicare IM give by:    Date Additional Medicare IM Given:    Additional Medicare Important Message give by:     If discussed at [place] of Stay Meetings, dates discussed:    Additional Comments:  Bethena Roys, RN 04/11/2015, 10:33 AM

## 2015-04-11 NOTE — Discharge Instructions (Addendum)
Atrial Fibrillation Atrial fibrillation is a type of irregular heart rhythm (arrhythmia). During atrial fibrillation, the upper chambers of the heart (atria) quiver continuously in a chaotic pattern. This causes an irregular and often rapid heart rate.  Atrial fibrillation is the result of the heart becoming overloaded with disorganized signals that tell it to beat. These signals are normally released one at a time by a part of the right atrium called the sinoatrial node. They then travel from the atria to the lower chambers of the heart (ventricles), causing the atria and ventricles to contract and pump blood as they pass. In atrial fibrillation, parts of the atria outside of the sinoatrial node also release these signals. This results in two problems. First, the atria receive so many signals that they do not have time to fully contract. Second, the ventricles, which can only receive one signal at a time, beat irregularly and out of rhythm with the atria.  There are three types of atrial fibrillation:   Paroxysmal. Paroxysmal atrial fibrillation starts suddenly and stops on its own within a week.  Persistent. Persistent atrial fibrillation lasts for more than a week. It may stop on its own or with treatment.  Permanent. Permanent atrial fibrillation does not go away. Episodes of atrial fibrillation may lead to permanent atrial fibrillation. Atrial fibrillation can prevent your heart from pumping blood normally. It increases your risk of stroke and can lead to heart failure.  CAUSES   Heart conditions, including a heart attack, heart failure, coronary artery disease, and heart valve conditions.   Inflammation of the sac that surrounds the heart (pericarditis).  Blockage of an artery in the lungs (pulmonary embolism).  Pneumonia or other infections.  Chronic lung disease.  Thyroid problems, especially if the thyroid is overactive (hyperthyroidism).  Caffeine, excessive alcohol use, and use  of some illegal drugs.   Use of some medicines, including certain decongestants and diet pills.  Heart surgery.   Birth defects.  Sometimes, no cause can be found. When this happens, the atrial fibrillation is called lone atrial fibrillation. The risk of complications from atrial fibrillation increases if you have lone atrial fibrillation and you are age 76 years or older. RISK FACTORS  Heart failure.  Coronary artery disease.  Diabetes mellitus.   High blood pressure (hypertension).   Obesity.   Other arrhythmias.   Increased age. SIGNS AND SYMPTOMS   A feeling that your heart is beating rapidly or irregularly.   A feeling of discomfort or pain in your chest.   Shortness of breath.   Sudden light-headedness or weakness.   Getting tired easily when exercising.   Urinating more often than normal (mainly when atrial fibrillation first begins).  In paroxysmal atrial fibrillation, symptoms may start and suddenly stop. DIAGNOSIS  Your health care provider may be able to detect atrial fibrillation when taking your pulse. Your health care provider may have you take a test called an ambulatory electrocardiogram (ECG). An ECG records your heartbeat patterns over a 24-hour period. You may also have other tests, such as:  Transthoracic echocardiogram (TTE). During echocardiography, sound waves are used to evaluate how blood flows through your heart.  Transesophageal echocardiogram (TEE).  Stress test. There is more than one type of stress test. If a stress test is needed, ask your health care provider about which type is best for you.  Chest X-ray exam.  Blood tests.  Computed tomography (CT). TREATMENT  Treatment may include:  Treating any underlying conditions. For example, if you  have an overactive thyroid, treating the condition may correct atrial fibrillation. °· Taking medicine. Medicines may be given to control a rapid heart rate or to prevent blood  clots, heart failure, or a stroke. °· Having a procedure to correct the rhythm of the heart: °¨ Electrical cardioversion. During electrical cardioversion, a controlled, low-energy shock is delivered to the heart through your skin. If you have chest pain, very low blood pressure, or sudden heart failure, this procedure may need to be done as an emergency. °¨ Catheter ablation. During this procedure, heart tissues that send the signals that cause atrial fibrillation are destroyed. °¨ Surgical ablation. During this surgery, thin lines of heart tissue that carry the abnormal signals are destroyed. This procedure can either be an open-heart surgery or a minimally invasive surgery. With the minimally invasive surgery, small cuts are made to access the heart instead of a large opening. °¨ Pulmonary venous isolation. During this surgery, tissue around the veins that carry blood from the lungs (pulmonary veins) is destroyed. This tissue is thought to carry the abnormal signals. °HOME CARE INSTRUCTIONS  °· Take medicines only as directed by your health care provider. Some medicines can make atrial fibrillation worse or recur. °· If blood thinners were prescribed by your health care provider, take them exactly as directed. Too much blood-thinning medicine can cause bleeding. If you take too little, you will not have the needed protection against stroke and other problems. °· Perform blood tests at home if directed by your health care provider. Perform blood tests exactly as directed. °· Quit smoking if you smoke. °· Do not drink alcohol. °· Do not drink caffeinated beverages such as coffee, soda, and some teas. You may drink decaffeinated coffee, soda, or tea.   °· Maintain a healthy weight. Do not use diet pills unless your health care provider approves. They may make heart problems worse.   °· Follow diet instructions as directed by your health care provider. °· Exercise regularly as directed by your health care  provider. °· Keep all follow-up visits as directed by your health care provider. This is important. °PREVENTION  °The following substances can cause atrial fibrillation to recur:  °· Caffeinated beverages. °· Alcohol. °· Certain medicines, especially those used for breathing problems. °· Certain herbs and herbal medicines, such as those containing ephedra or ginseng. °· Illegal drugs, such as cocaine and amphetamines. °Sometimes medicines are given to prevent atrial fibrillation from recurring. Proper treatment of any underlying condition is also important in helping prevent recurrence.  °SEEK MEDICAL CARE IF: °· You notice a change in the rate, rhythm, or strength of your heartbeat. °· You suddenly begin urinating more frequently. °· You tire more easily when exerting yourself or exercising. °SEEK IMMEDIATE MEDICAL CARE IF:  °· You have chest pain, abdominal pain, sweating, or weakness. °· You feel nauseous. °· You have shortness of breath. °· You suddenly have swollen feet and ankles. °· You feel dizzy. °· Your face or limbs feel numb or weak. °· You have a change in your vision or speech. °MAKE SURE YOU:  °· Understand these instructions. °· Will watch your condition. °· Will get help right away if you are not doing well or get worse. °Document Released: 10/25/2005 Document Revised: 03/11/2014 Document Reviewed: 12/05/2012 °[employer]® Patient Information ©2015 [employer]. This information is not intended to replace advice given to you by your health care provider. Make sure you discuss any questions you have with your health care provider. ° °Information on my medicine -   ELIQUIS (apixaban)  This medication education was reviewed with me or my healthcare representative as part of my discharge preparation.  The pharmacist that spoke with me during my hospital stay was:  Wayland Salinas, [facility]  Why was Eliquis prescribed for you? Eliquis was prescribed for you to reduce the risk of a blood clot  forming that can cause a stroke if you have a medical condition called atrial fibrillation (a type of irregular heartbeat).  What do You need to know about Eliquis ? Take your Eliquis TWICE DAILY - one tablet in the morning and one tablet in the evening with or without food. If you have difficulty swallowing the tablet whole please discuss with your pharmacist how to take the medication safely.  Take Eliquis exactly as prescribed by your doctor and DO NOT stop taking Eliquis without talking to the doctor who prescribed the medication.  Stopping may increase your risk of developing a stroke.  Refill your prescription before you run out.  After discharge, you should have regular check-up appointments with your healthcare provider that is prescribing your Eliquis.  In the future your dose may need to be changed if your kidney function or weight changes by a significant amount or as you get older.  What do you do if you miss a dose? If you miss a dose, take it as soon as you remember on the same day and resume taking twice daily.  Do not take more than one dose of ELIQUIS at the same time to make up a missed dose.  Important Safety Information A possible side effect of Eliquis is bleeding. You should call your healthcare provider right away if you experience any of the following: ? Bleeding from an injury or your nose that does not stop. ? Unusual colored urine (red or dark brown) or unusual colored stools (red or black). ? Unusual bruising for unknown reasons. ? A serious fall or if you hit your head (even if there is no bleeding).  Some medicines may interact with Eliquis and might increase your risk of bleeding or clotting while on Eliquis. To help avoid this, consult your healthcare provider or pharmacist prior to using any new prescription or non-prescription medications, including herbals, vitamins, non-steroidal anti-inflammatory drugs (NSAIDs) and supplements.  This website has more  information on Eliquis (apixaban): http://www.eliquis.com/eliquis/home

## 2015-04-11 NOTE — Discharge Summary (Signed)
Discharge Summary   Patient ID: Norma Barajas,  MRN: [id number], DOB/AGE: [birth year] 74 y.o.  Admit date: 04/10/2015 Discharge date: 04/11/2015  Primary Care Provider: Evelina Dun A Primary Cardiologist: Dr. [name]  Discharge Diagnoses Principal Problem:   Atrial fibrillation with rapid ventricular response Active Problems:   Hyperlipidemia   GAD (generalized anxiety disorder)   MITRAL REGURGITATION   Essential hypertension   GERD   Coronary atherosclerosis of native coronary artery   Vitamin D deficiency   Mitral regurgitation   Anticoagulant adverse reaction   Allergies Allergies  Allergen Reactions  . Crestor [Rosuvastatin Calcium] Other (See Comments)    weakness  . Iodine     ivp dye  . Iohexol   . Morphine Nausea Only  . Risedronate Sodium Other (See Comments)    ACTONEL REACTION: reflux  . Triamterene-Hctz Other (See Comments)    weakness  . Atorvastatin Other (See Comments)    Myalgias     Procedures  LV EF: 65%  ------------------------------------------------------------------- Indications:   Atrial fibrillation - 427.31.  ------------------------------------------------------------------- History:  PMH:  Murmur. Risk factors: Hypertension. Dyslipidemia.  ------------------------------------------------------------------- Study Conclusions  - Left ventricle: The cavity size was normal. Wall thickness was increased in a pattern of mild LVH. The estimated ejection fraction was 65%. Wall motion was normal; there were no regional wall motion abnormalities. - Right ventricle: The cavity size was normal. Systolic function was normal.  History of Present Illness  Norma Barajas is a 74 y.o. female with a history of non obstructive CAD, mild mitral regurgitation, paroxysmal atrial flutter, HTN, HL, anxiety who brought to [facility] 04/10/15 by EMS for afib with RVR.  Cardiac catheterization in 2006 showed non obstructive CAD. Had normal  Lexiscan in 07/2013. Last seen by Dr [name] in June 2014, at that time in NSR. Intolerance to amiodarone. Echo 09/2012 LV EF of 60-65%, no WM abnormality, grade 1 DD, mild mitral reg, mild tri regur, pulmonary artery peak pressure 35mmg Hg, lipomatous atrial septum. She was in [place] up until few weeks ago when she noticed intermittent exertional upper chest pressure with occasional radiation to left jaw. She fell early April of 2016 on her left shoulder and then she had a right Tibia/Fibula fracture surgery.    She woke up with a upset stomach without nausea or vomiting on 04/10/15.  Then she developed palpitation, generalized discomfort and diaphoresis. She called EMS as she know she was back in Afib. She received SL nitro by EMS for Left chest tightness with mild SOB. Asymptomatic in ED. She deniesd syncope, diarrhea, chills, fever, PND, orthopnea or LE swelling. She is non smoker and non drinker.   In ED her initial trop normal. Creatinine normal. K of 3.3. Magnesium 2.1. HgbA1c of 5.36. TSH normal. EKG showed A.fib with RVR at rate of 158 and PVCs, no acute abnormality. She was given ASA and started on dilt gtt in ED. She was spontaneously converted to NSR in ED. Supplement given for low potassium.   Hospital Course  She was admitted overnight for observation with plan to start her on Eliquis in morning. Her amlodipine was discontinued. CHADSVASc score of at least 4 (age 14, female 1, HTN 1).  Heparin per pharmacy started. On floor her dilt gtt was discontinued and she was started on Cardizem CD 120mg . Overnight she was asymptomatic. Troponin x3negative. She remained in SR. Care manger consulted as she can not afford Eliquis copay. Then plan made to start her on coumadin with bridging with Levanox.  Then question arises if she can afford Levaxol and coumadin clinic. Ultimately she decided to be on Eliquis. Her echo showed  stimated ejection fraction was 65%, wall motion was normal; there were no regional  wall motion abnormalities. She was given one dose of Eliquis and her heparin gtt was discontinued. She is planning to transfer care with Dr. Percival Spanish @ [facility]. Further decision will be made at outpatient. Education given to start Eliquis 5mg  BID from tomorrow.   Discharge Vitals Blood pressure 131/72, pulse 73, temperature 97.8 F (36.6 C), temperature source Oral, resp. rate 18, height 5\' 1"  (1.549 m), weight 135 lb 6.4 oz (61.417 kg), SpO2 95 %.  Filed Weights   04/10/15 1439 04/11/15 0500  Weight: 134 lb 8 oz (61.009 kg) 135 lb 6.4 oz (61.417 kg)    CBC  Recent Labs  04/10/15 0807 04/11/15 0410  WBC 8.3 8.0  HGB 14.9 13.0  HCT 44.7 39.5  MCV 85.0 85.3  PLT 194 865   Basic Metabolic Panel  Recent Labs  04/10/15 0807 04/11/15 0410  NA 139 137  K 3.3* 4.0  CL 105 102  CO2 22 24  GLUCOSE 158* 93  BUN 8 9  CREATININE 0.81 0.91  CALCIUM 8.4* 8.4*  MG 2.1  --       Recent Labs  04/10/15 1647 04/10/15 2130 04/11/15 0226  TROPONINI 0.03 0.03 0.03    Fasting Lipid Panel  Recent Labs  04/11/15 0410  CHOL 162  HDL 43  LDLCALC 93  TRIG 131  CHOLHDL 3.8   Thyroid Function Tests  Recent Labs  04/10/15 0807  TSH 1.483    Disposition  Pt is being discharged home today in good condition.  Follow-up Plans & Appointments  Follow-up Information    Follow up with Jenkins Rouge, MD On 04/21/2015.   Specialty:  Cardiology   Why:  @ 11:45 at [facility] office not at [facility] information:   [place] [address] [place] [address] [phone]           Discharge Instructions    Diet - low sodium heart healthy    Complete by:  As directed      Increase activity slowly    Complete by:  As directed   Call doctor if you see dark looking stool, blood in a stool or multiple bruise           F/u Labs/Studies: none  Discharge Medications    Medication List    STOP taking these medications        amLODipine 10 MG tablet   Commonly known as:  NORVASC     aspirin EC 81 MG tablet      TAKE these medications        ALPRAZolam 0.25 MG tablet  Commonly known as:  XANAX  TAKE 1 TABLET BY MOUTH TWICE A DAY AS NEEDED FOR ANXIETY, NERVES, OR SLEEP     apixaban 5 MG Tabs tablet  Commonly known as:  ELIQUIS  Take 1 tablet (5 mg total) by mouth 2 (two) times daily.     atenolol 50 MG tablet  Commonly known as:  TENORMIN  Take 0.5 tablets (25 mg total) by mouth daily.     calcium citrate-vitamin D 315-200 MG-UNIT per tablet  Commonly known as:  CITRACAL+D  Take 2 tablets by mouth every morning.     cholecalciferol 1000 UNITS tablet  Commonly known as:  VITAMIN D  Take 1,000 Units  by mouth daily.     diltiazem 120 MG 24 hr capsule  Commonly known as:  CARDIZEM CD  Take 1 capsule (120 mg total) by mouth daily.     multivitamin capsule  Take 1 capsule by mouth every evening.     omeprazole 20 MG capsule  Commonly known as:  PRILOSEC  Take 1 capsule (20 mg total) by mouth 2 (two) times daily.     simvastatin 40 MG tablet  Commonly known as:  ZOCOR  Take 1 tablet (40 mg total) by mouth every evening.        Duration of Discharge Encounter   Greater than 30 minutes including physician time.  Signed, Kailany Dinunzio PA-C 04/11/2015, 3:59 PM

## 2015-04-11 NOTE — Progress Notes (Signed)
  Echocardiogram 2D Echocardiogram has been performed.  Jennette Dubin 04/11/2015, 11:30 AM

## 2015-04-11 NOTE — Progress Notes (Signed)
ANTICOAGULATION CONSULT NOTE - Follow Up Consult  Pharmacy Consult for Heparin Indication: atrial fibrillation  Allergies  Allergen Reactions  . Crestor [Rosuvastatin Calcium] Other (See Comments)    weakness  . Iodine     ivp dye  . Iohexol   . Morphine Nausea Only  . Risedronate Sodium Other (See Comments)    ACTONEL REACTION: reflux  . Triamterene-Hctz Other (See Comments)    weakness  . Atorvastatin Other (See Comments)    Myalgias     Patient Measurements: Height: 5\' 1"  (154.9 cm) Weight: 135 lb 6.4 oz (61.417 kg) IBW/kg (Calculated) : 47.8 Heparin Dosing Weight: 61 kg  Vital Signs: Temp: 98 F (36.7 C) (06/03 0500) BP: 114/62 mmHg (06/03 0855) Pulse Rate: 65 (06/03 0500)  Labs:  Recent Labs  04/10/15 0807 04/10/15 1647 04/10/15 2130 04/10/15 2145 04/11/15 0226 04/11/15 0400 04/11/15 0410  HGB 14.9  --   --   --   --   --  13.0  HCT 44.7  --   --   --   --   --  39.5  PLT 194  --   --   --   --   --  205  HEPARINUNFRC  --   --   --  0.54  --  0.46  --   CREATININE 0.81  --   --   --   --   --  0.91  TROPONINI <0.03 0.03 0.03  --  0.03  --   --     Estimated Creatinine Clearance: 44.2 mL/min (by C-G formula based on Cr of 0.91).  Assessment: 56 yof to ED w/ afib w/ RVR, palpitations, diaphoresis.   AC: IV heparin for PAF - spontaneously converted to NSR. CHADSVASC 4. Plan to start eliquis in AM 6/3. No bleed documented. CBC wnl. Heparin level 0.46 remains in goal.  Goal of Therapy:  Heparin level 0.3-0.7 units/ml Monitor platelets by anticoagulation protocol: Yes   Plan:  Continue IV heparin at 900 units/hr for now. Daily HL and cBC  Brain Honeycutt S. Alford Highland, PharmD, BCPS Clinical Staff Pharmacist Pager [phone]  Eilene Ghazi Stillinger 04/11/2015,10:04 AM

## 2015-04-11 NOTE — Progress Notes (Signed)
I reviewed d/c instructions with patient and husband. No further questions, d/c'd in stable condition. Assessment unchanged from this am

## 2015-04-14 ENCOUNTER — Telehealth: Payer: Self-pay | Admitting: Cardiovascular Disease

## 2015-04-14 NOTE — Telephone Encounter (Signed)
PT NOTIFIED OKAY  TO  MAKE MED CHANGE./CY

## 2015-04-14 NOTE — Telephone Encounter (Signed)
Patient states she can not take the new medicine prescribed to her in hospital. She has not yet been seen in this office though. Appointment scheduled for next Monday. / tg

## 2015-04-14 NOTE — Telephone Encounter (Signed)
Pt states diltiazem makes her stomach bloat and she is "swimmy headed".Has home BP machine and states her BP is normal.She informs me she is going to stop diltiazem and start amlodipine again

## 2015-04-14 NOTE — Telephone Encounter (Signed)
Ok

## 2015-04-14 NOTE — Telephone Encounter (Signed)
No answer, left message to call back

## 2015-04-14 NOTE — Telephone Encounter (Signed)
Patient wants to know if she should continue vitamin D 1000iu daily since she started Calcium + Vitamin D. She also has vitamin D in MVI she takes daily.  I recommended that she hold Vitamin  D 1000IU and will recheck vitamin D level with next labs.

## 2015-04-21 ENCOUNTER — Encounter: Payer: Self-pay | Admitting: Cardiovascular Disease

## 2015-04-21 ENCOUNTER — Telehealth: Payer: Self-pay | Admitting: Cardiovascular Disease

## 2015-04-21 ENCOUNTER — Ambulatory Visit (INDEPENDENT_AMBULATORY_CARE_PROVIDER_SITE_OTHER): Payer: Commercial Managed Care - HMO | Admitting: Cardiovascular Disease

## 2015-04-21 VITALS — BP 132/74 | HR 72 | Ht 61.0 in | Wt 139.0 lb

## 2015-04-21 DIAGNOSIS — I209 Angina pectoris, unspecified: Secondary | ICD-10-CM | POA: Diagnosis not present

## 2015-04-21 DIAGNOSIS — R072 Precordial pain: Secondary | ICD-10-CM

## 2015-04-21 DIAGNOSIS — I482 Chronic atrial fibrillation, unspecified: Secondary | ICD-10-CM

## 2015-04-21 MED ORDER — APIXABAN 5 MG PO TABS: 5.0000 mg | ORAL_TABLET | Freq: Two times a day (BID) | ORAL | Status: DC

## 2015-04-21 NOTE — Progress Notes (Signed)
Patient ID: Norma Barajas, female   DOB: [birth year], 74 y.o.   MRN: [id number]     Cardiology Office Note   Date:  04/21/2015   ID:  Norma, Barajas 05/10/1941, MRN [id number]  PCP:  Sharion Balloon, FNP  Cardiologist:   Jenkins Rouge, MD   No chief complaint on file.     History of Present Illness: Norma Barajas is a 74 y.o. female who presents post hospital for PAF  I last saw her in 2014  History of non obstructive CAD, mild mitral regurgitation, paroxysmal atrial flutter, HTN, HL, anxiety who brought to [facility] by EMS for afib with RVR. 04/10/15   Cardiac catheterization in 2006 showed non obstructive CAD. Had normal Lexiscan in 07/2013. Last seen by Dr [name] in June 2014, at that time in NSR. Intolerance to amiodarone. Echo 09/2012 LV EF of 60-65%, no WM abnormality, grade 1 DD, mild mitral reg, mild tri regur, pulmonary artery peak pressure 24mmg Hg, lipomatous atrial septum. She was in [place] up until few weeks ago when she noticed intermittent exertional upper chest pressure with occasional radiation to left jaw. She fell early April of 2016 on her left shoulder and then she had a right Tibia/Fibula fracture surgery.   04/10/15 she woke up with a upset stomach without nausea or vomiting. She took laxative  for constipation. Then she developed palpitation, generalized discomfort and diaphoresis. She called EMS as she thought she back in Afib. She received SL nitro by EMS for Left chest tightness with mild SOB.  Converted with cardizem  Reviewed echo normal with EF 65%  Started on Eliquis and d/c home     Past Medical History  Diagnosis Date  . Essential hypertension, benign   . Mitral regurgitation     Mild 09/2012  . Erosive esophagitis   . GERD (gastroesophageal reflux disease)   . Hypercholesterolemia   . Coronary atherosclerosis of native coronary artery     Nonobstructive CAD 10/2005  . Paroxysmal atrial fibrillation   . Diastolic dysfunction     Grade 1. Ejection fraction  60-65%.  . Osteoporosis   . PONV (postoperative nausea and vomiting)   . Heart murmur   . History of hiatal hernia   . Migraine     "used to have them right bad; I don't now" (04/11/2015)  . Vertigo   . Arthritis     "right leg" (04/11/2015)  . Anxiety   . Depression     Past Surgical History  Procedure Laterality Date  . Total abdominal hysterectomy    . Prolapsed uterine fibroid ligation  2015  . Cataract extraction w/ intraocular lens  implant, bilateral Bilateral   . Open reduction internal fixation (orif) tibia/fibula fracture Right 2013    broke tibia and fibula after falling down stairs  . Esophagogastroduodenoscopy (egd) with esophageal dilation  2001    Dr. [name]: erosive esophagitis, esophageal stricture, duodenitis, s/p Savary dilation  . Colonoscopy  2008    Dr. [name]: internal hemorrhoids   . Esophagogastroduodenoscopy (egd) with esophageal dilation  11/15/2012    QQV:[id number] web was found & MOST LIKELY CAUSE FOR DYAPHAGIA/Polyp was found in the gastric body and gastric fundus/ gastritis on bx  . Bravo ph study  11/15/2012    Procedure: BRAVO [place] STUDY;  Surgeon: [name], MD;  [facility];  Service: Endoscopy;;  . Flexible sigmoidoscopy N/A 08/22/2014    Procedure: FLEXIBLE SIGMOIDOSCOPY;  Surgeon: [name], MD;  [facility];  Service: Endoscopy;  Laterality: N/A;  830  . Hemorrhoid banding N/A 08/22/2014    Procedure: HEMORRHOID BANDING;  Surgeon: [name], MD;  [facility];  Service: Endoscopy;  Laterality: N/A;  . Fracture surgery    . Eye surgery Bilateral     "laser OR after cataract OR; cause I couldn't see"  . Dilation and curettage of uterus       Current Outpatient Prescriptions  Medication Sig Dispense Refill  . ALPRAZolam (XANAX) 0.25 MG tablet TAKE 1 TABLET BY MOUTH TWICE A DAY AS NEEDED FOR ANXIETY, NERVES, OR SLEEP 60 tablet 3  . amLODipine-benazepril (LOTREL) 10-20 MG per capsule Take 1 capsule  by mouth daily.    Marland Kitchen apixaban (ELIQUIS) 5 MG TABS tablet Take 1 tablet (5 mg total) by mouth 2 (two) times daily. 60 tablet 11  . atenolol (TENORMIN) 50 MG tablet Take 0.5 tablets (25 mg total) by mouth daily. 90 tablet 1  . calcium citrate-vitamin D (CITRACAL+D) 315-200 MG-UNIT per tablet Take 2 tablets by mouth every morning.    . cholecalciferol (VITAMIN D) 1000 UNITS tablet Take 1,000 Units by mouth daily.    . Multiple Vitamin (MULTIVITAMIN) capsule Take 1 capsule by mouth every evening.    Marland Kitchen omeprazole (PRILOSEC) 20 MG capsule Take 1 capsule (20 mg total) by mouth 2 (two) times daily. (Patient taking differently: Take 20 mg by mouth daily. ) 180 capsule 4  . simvastatin (ZOCOR) 40 MG tablet Take 1 tablet (40 mg total) by mouth every evening. 90 tablet 4   No current facility-administered medications for this visit.    Allergies:   Crestor; Iodine; Iohexol; Morphine; Risedronate sodium; Triamterene-hctz; and Atorvastatin    Social History:  The patient  reports that she has never smoked. She has never used smokeless tobacco. She reports that she does not drink alcohol or use illicit drugs.   Family History:  The patient's family history includes Arthritis in her sister; Cancer in her sister; Cancer - Other in her sister; Colon cancer in her mother; Diabetes in her sister and son; Heart disease in her father and mother; Hip fracture in her mother; Hyperlipidemia in her brother; Hypertension in her brother; Osteoporosis in her mother and sister; Stroke in her brother, sister, and sister.    ROS:  Please see the history of present illness.   Otherwise, review of systems are positive for none.   All other systems are reviewed and negative.    PHYSICAL EXAM: VS:  BP 132/74 mmHg  Pulse 72  Ht 5\' 1"  (1.549 m)  Wt 63.05 kg (139 lb)  BMI 26.28 kg/m2  SpO2 99% , BMI Body mass index is 26.28 kg/(m^2). Affect appropriate Healthy:  appears stated age 1: normal Neck supple with no  adenopathy JVP normal no bruits no thyromegaly Lungs clear with no wheezing and good diaphragmatic motion Heart:  S1/S2 no murmur, no rub, gallop or click PMI normal Abdomen: benighn, BS positve, no tenderness, no AAA no bruit.  No HSM or HJR Distal pulses intact with no bruits No edema Neuro non-focal Skin warm and dry No muscular weakness    EKG:   afib rate 158 nonspecific ST changes  04/11/15     Recent Labs: 02/28/2015: ALT 11 04/10/2015: Magnesium 2.1; TSH 1.483 04/11/2015: BUN 9; Creatinine, Ser 0.91; Hemoglobin 13.0; Platelets 205; Potassium 4.0; Sodium 137    Lipid Panel    Component Value Date/Time   CHOL 162 04/11/2015 0410  CHOL 211* 11/26/2014 0841   CHOL 192 05/15/2013 0829   TRIG 131 04/11/2015 0410   TRIG 258* 03/19/2014 0812   TRIG 179* 05/15/2013 0829   HDL 43 04/11/2015 0410   HDL 45 11/26/2014 0841   HDL 45 03/19/2014 0812   HDL 44 05/15/2013 0829   CHOLHDL 3.8 04/11/2015 0410   CHOLHDL 4.7* 11/26/2014 0841   VLDL 26 04/11/2015 0410   LDLCALC 93 04/11/2015 0410   LDLCALC 117* 11/26/2014 0841   LDLCALC 101* 03/19/2014 0812   LDLCALC 112* 05/15/2013 0829      Wt Readings from Last 3 Encounters:  04/21/15 63.05 kg (139 lb)  04/11/15 61.417 kg (135 lb 6.4 oz)  04/04/15 63.05 kg (139 lb)      Other studies Reviewed: Additional studies/ records that were reviewed today include: Epic notes DC summary and ECG's.    ASSESSMENT AND PLAN:  1.  PAF:  Continue atenolol and norvasc cannot tolerate cardizem  Samples of eliquis given 2. Chest pain in setting of rapid afib  F/u lexiscan myovue 3. Chol:  On statin  4. HTN:  Well controlled.  Continue current medications and low sodium Dash type diet.      Current medicines are reviewed at length with the patient today.  The patient does not have concerns regarding medicines.  The following changes have been made:  no change  Labs/ tests ordered today include:  Lexiscan myovue   No orders of the  defined types were placed in this encounter.     Disposition:   FU with  6 months      Signed, Jenkins Rouge, MD  04/21/2015 11:51 AM    [facility] [place], [place], [place]  [address] Phone: [phone]; Fax: [phone]

## 2015-04-21 NOTE — Patient Instructions (Signed)
Your physician wants you to follow-up in: 6 months with Dr Blima Singer will receive a reminder letter in the mail two months in advance. If you don't receive a letter, please call our office to schedule the follow-up appointment.    Your physician has requested that you have a lexiscan myoview. For further information please visit [email or web address]. Please follow instruction sheet, as given.     Your physician recommends that you continue on your current medications as directed. Please refer to the Current Medication list given to you today.     Thank you for choosing [facility] !

## 2015-04-21 NOTE — Telephone Encounter (Signed)
Lexiscan Schedule at [facility] June 17th arrive at 8:45

## 2015-04-22 NOTE — Telephone Encounter (Signed)
AUTH # [id number] EXP 10/19/15

## 2015-04-25 ENCOUNTER — Encounter (HOSPITAL_COMMUNITY)
Admission: RE | Admit: 2015-04-25 | Discharge: 2015-04-25 | Disposition: A | Discharge status: Home / Self Care | Payer: Commercial Managed Care - HMO | Source: Ambulatory Visit | Attending: Cardiovascular Disease | Admitting: Cardiovascular Disease

## 2015-04-25 ENCOUNTER — Encounter (HOSPITAL_COMMUNITY): Payer: Self-pay

## 2015-04-25 ENCOUNTER — Inpatient Hospital Stay (HOSPITAL_COMMUNITY): Admission: RE | Admit: 2015-04-25 | Payer: Medicare HMO | Source: Ambulatory Visit

## 2015-04-25 DIAGNOSIS — I482 Chronic atrial fibrillation, unspecified: Secondary | ICD-10-CM

## 2015-04-25 DIAGNOSIS — R072 Precordial pain: Secondary | ICD-10-CM | POA: Diagnosis not present

## 2015-04-25 MED ORDER — REGADENOSON 0.4 MG/5ML IV SOLN
INTRAVENOUS | Status: AC
  Administered 2015-04-25: 0.4 mg via INTRAVENOUS
  Filled 2015-04-25: qty 5

## 2015-04-25 MED ORDER — TECHNETIUM TC 99M SESTAMIBI - CARDIOLITE
30.0000 | Freq: Once | INTRAVENOUS | Status: AC | PRN
  Administered 2015-04-25: 30 via INTRAVENOUS

## 2015-04-25 MED ORDER — TECHNETIUM TC 99M SESTAMIBI GENERIC - CARDIOLITE
10.0000 | Freq: Once | INTRAVENOUS | Status: AC | PRN
  Administered 2015-04-25: 10 via INTRAVENOUS

## 2015-04-25 MED ORDER — SODIUM CHLORIDE 0.9 % IJ SOLN
INTRAMUSCULAR | Status: AC
  Administered 2015-04-25: 10 mL via INTRAVENOUS
  Filled 2015-04-25: qty 3

## 2015-04-29 ENCOUNTER — Telehealth: Payer: Self-pay | Admitting: Cardiovascular Disease

## 2015-04-29 LAB — NM MYOCAR MULTI W/SPECT W/WALL MOTION / EF
CHL CUP NUCLEAR SRS: 3
CHL CUP RESTING HR STRESS: 65 {beats}/min
LV sys vol: 8 mL
LVDIAVOL: 52 mL
Peak HR: 112 {beats}/min
RATE: 0.34
SDS: 3
SSS: 5
TID: 1.14

## 2015-04-29 NOTE — Telephone Encounter (Signed)
Patient states she went into a-fib last night for 1 hr with a HR of 104. She denies any caffeine use or missed meds.States she still doesn't feel herself today.    Will forward to Dr [name]

## 2015-04-29 NOTE — Telephone Encounter (Signed)
Patient will see Sharrell Ku this Thursday 6/23 at 1 pm

## 2015-04-29 NOTE — Telephone Encounter (Signed)
Patient states that she went in to A-Fib last night. / tg

## 2015-04-29 NOTE — Telephone Encounter (Signed)
She is on eliquis and atenolol  Can have routine f/u with PA

## 2015-04-30 ENCOUNTER — Encounter: Payer: Self-pay | Admitting: Physician Assistant

## 2015-04-30 NOTE — Progress Notes (Deleted)
Cardiology Office Note Date:  04/30/2015  Patient ID:  Norma Barajas 1941/11/01, MRN [id number] PCP:  Sharion Balloon, FNP  Cardiologist:  Dr. [name]  ***refresh   Chief Complaint: elevated HR  History of Present Illness: Norma Barajas is a 75 y.o. female with history of nonobstructive minimal CAD by cath 2006, paroxysmal atrial fib (intolerant to amiodarone), HTN, HLD, anxiety, GERD who presents for evaluation of afib. Earlier this month she was admitted with upset stomach, palpitations, generalized discomfort and diaphoresis. She was found to be in atrial fib with RVR and PVCs. She converted spontaneously on diltiazem and was started on Eliquis. Potassium was repleted and troponin negative. 2D echo 04/11/15: mild LVH, EF 65%, normal RV. She was sent home on oral diltiazem and amlodipine was stopped but the patient called and said it was making her feel swimmy headed so she stopped taking it. She had also reported  some chest discomfort previously while in afib thus Lexiscan nuc was ordered but ***.   Past Medical History  Diagnosis Date  . Essential hypertension   . Erosive esophagitis   . GERD (gastroesophageal reflux disease)   . Hypercholesterolemia   . Coronary atherosclerosis of native coronary artery     a. Nonobstructive minimal CAD 10/2005.  Marland Kitchen Paroxysmal atrial fibrillation   . Diastolic dysfunction     Grade 1. Ejection fraction 60-65%.  . Osteoporosis   . PONV (postoperative nausea and vomiting)   . Heart murmur   . History of hiatal hernia   . Migraine     "used to have them right bad; I don't now" (04/11/2015)  . Vertigo   . Arthritis     "right leg" (04/11/2015)  . Anxiety   . Depression     Past Surgical History  Procedure Laterality Date  . Total abdominal hysterectomy    . Prolapsed uterine fibroid ligation  2015  . Cataract extraction w/ intraocular lens  implant, bilateral Bilateral   . Open reduction internal fixation (orif) tibia/fibula fracture Right  2013    broke tibia and fibula after falling down stairs  . Esophagogastroduodenoscopy (egd) with esophageal dilation  2001    Dr. [name]: erosive esophagitis, esophageal stricture, duodenitis, s/p Savary dilation  . Colonoscopy  2008    Dr. [name]: internal hemorrhoids   . Esophagogastroduodenoscopy (egd) with esophageal dilation  11/15/2012    VFI:[id number] web was found & MOST LIKELY CAUSE FOR DYAPHAGIA/Polyp was found in the gastric body and gastric fundus/ gastritis on bx  . Bravo ph study  11/15/2012    Procedure: BRAVO [place] STUDY;  Surgeon: [name], MD;  [facility];  Service: Endoscopy;;  . Flexible sigmoidoscopy N/A 08/22/2014    Procedure: FLEXIBLE SIGMOIDOSCOPY;  Surgeon: [name], MD;  [facility];  Service: Endoscopy;  Laterality: N/A;  830  . Hemorrhoid banding N/A 08/22/2014    Procedure: HEMORRHOID BANDING;  Surgeon: [name], MD;  [facility];  Service: Endoscopy;  Laterality: N/A;  . Fracture surgery    . Eye surgery Bilateral     "laser OR after cataract OR; cause I couldn't see"  . Dilation and curettage of uterus      Current Outpatient Prescriptions  Medication Sig Dispense Refill  . ALPRAZolam (XANAX) 0.25 MG tablet TAKE 1 TABLET BY MOUTH TWICE A DAY AS NEEDED FOR ANXIETY, NERVES, OR SLEEP 60 tablet 3  . amLODipine-benazepril (LOTREL) 10-20 MG per capsule Take 1 capsule by  mouth daily.    Marland Kitchen apixaban (ELIQUIS) 5 MG TABS tablet Take 1 tablet (5 mg total) by mouth 2 (two) times daily. 180 tablet 3  . atenolol (TENORMIN) 50 MG tablet Take 0.5 tablets (25 mg total) by mouth daily. 90 tablet 1  . calcium citrate-vitamin D (CITRACAL+D) 315-200 MG-UNIT per tablet Take 2 tablets by mouth every morning.    . cholecalciferol (VITAMIN D) 1000 UNITS tablet Take 1,000 Units by mouth daily.    . Multiple Vitamin (MULTIVITAMIN) capsule Take 1 capsule by mouth every evening.    Marland Kitchen omeprazole (PRILOSEC) 20 MG capsule Take 1 capsule  (20 mg total) by mouth 2 (two) times daily. (Patient taking differently: Take 20 mg by mouth daily. ) 180 capsule 4  . simvastatin (ZOCOR) 40 MG tablet Take 1 tablet (40 mg total) by mouth every evening. 90 tablet 4   No current facility-administered medications for this visit.    Allergies:   Crestor; Iodine; Iohexol; Morphine; Risedronate sodium; Triamterene-hctz; and Atorvastatin   Social History:  The patient  reports that she has never smoked. She has never used smokeless tobacco. She reports that she does not drink alcohol or use illicit drugs.   Family History:  The patient's family history includes Arthritis in her sister; Cancer in her sister; Cancer - Other in her sister; Colon cancer in her mother; Diabetes in her sister and son; Heart disease in her father and mother; Hip fracture in her mother; Hyperlipidemia in her brother; Hypertension in her brother; Osteoporosis in her mother and sister; Stroke in her brother, sister, and sister.***  ROS:  Please see the history of present illness. Otherwise, review of systems is positive for ***.   All other systems are reviewed and otherwise negative.   PHYSICAL EXAM: *** VS:  There were no vitals taken for this visit. BMI: There is no weight on file to calculate BMI. Well nourished, well developed, in no acute distress HEENT: normocephalic, atraumatic Neck: no JVD, carotid bruits or masses Cardiac:  normal S1, S2; RRR; no murmurs, rubs, or gallops Lungs:  clear to auscultation bilaterally, no wheezing, rhonchi or rales Abd: soft, nontender, no hepatomegaly, + BS MS: no deformity or atrophy Ext: no edema Skin: warm and dry, no rash Neuro:  moves all extremities spontaneously, no focal abnormalities noted, follows commands Psych: euthymic mood, full affect   EKG:  Done today shows ***  Recent Labs: 02/28/2015: ALT 11 04/10/2015: Magnesium 2.1; TSH 1.483 04/11/2015: BUN 9; Creatinine, Ser 0.91; Hemoglobin 13.0; Platelets 205; Potassium  4.0; Sodium 137  04/11/2015: Cholesterol 162; HDL 43; LDL Cholesterol 93; Total CHOL/HDL Ratio 3.8; Triglycerides 131; VLDL 26   Estimated Creatinine Clearance: 44.8 mL/min (by C-G formula based on Cr of 0.91).   Wt Readings from Last 3 Encounters:  04/21/15 139 lb (63.05 kg)  04/11/15 135 lb 6.4 oz (61.417 kg)  04/04/15 139 lb (63.05 kg)     Other studies reviewed: Additional studies/records reviewed today include: summarized above***  ASSESSMENT AND PLAN:  1. Paroxysmal atrial fib 2. Essential HTN 3. Recent chest pain 4. Nonobstructive mild CAD by cath in 2006  Nuc? hypok?  Disposition: F/u with ***  Current medicines are reviewed at length with the patient today.  The patient did not have any concerns regarding medicines.***  Signed, Melina Copa PA-C 04/30/2015 7:34 PM     [facility] [address] [place], [place] [address] [phone]

## 2015-05-01 ENCOUNTER — Telehealth: Payer: Self-pay

## 2015-05-01 ENCOUNTER — Encounter: Payer: Commercial Managed Care - HMO | Admitting: Physician Assistant

## 2015-05-01 NOTE — Telephone Encounter (Signed)
Pt notified Myoview normal

## 2015-05-01 NOTE — Progress Notes (Signed)
This encounter was created in error - please disregard.

## 2015-05-02 ENCOUNTER — Ambulatory Visit (INDEPENDENT_AMBULATORY_CARE_PROVIDER_SITE_OTHER): Payer: Commercial Managed Care - HMO | Admitting: Cardiovascular Disease

## 2015-05-02 ENCOUNTER — Encounter: Payer: Self-pay | Admitting: Cardiovascular Disease

## 2015-05-02 VITALS — BP 160/80 | HR 72 | Ht 61.0 in | Wt 138.8 lb

## 2015-05-02 DIAGNOSIS — I480 Paroxysmal atrial fibrillation: Secondary | ICD-10-CM

## 2015-05-02 DIAGNOSIS — I10 Essential (primary) hypertension: Secondary | ICD-10-CM

## 2015-05-02 MED ORDER — ATENOLOL 50 MG PO TABS: 50.0000 mg | ORAL_TABLET | Freq: Every day | ORAL | Status: DC

## 2015-05-02 MED ORDER — AMLODIPINE BESY-BENAZEPRIL HCL 10-20 MG PO CAPS: 1.0000 | ORAL_CAPSULE | Freq: Every day | ORAL | Status: DC

## 2015-05-02 NOTE — Patient Instructions (Addendum)
Your physician recommends that you schedule a follow-up appointment in: July with Dr [name]   INCREASE Atenolol to 50 mg daily  Take Lotrel in the morning vs bedtime    Thank you for choosing [facility] !

## 2015-05-02 NOTE — Progress Notes (Signed)
Patient ID: Norma Barajas, female   DOB: Jul 27, 1941, 74 y.o.   MRN: [id number]     Cardiology Office Note   Date:  05/02/2015   ID:  Norma, Barajas 1941-09-22, MRN [id number]  PCP:  Sharion Balloon, FNP  Cardiologist:   Jenkins Rouge, MD   No chief complaint on file.     History of Present Illness: Norma Barajas is a 74 y.o. female who presents post hospital for PAF  I last saw her in 2014  History of non obstructive CAD, mild mitral regurgitation, paroxysmal atrial flutter, HTN, HL, anxiety who brought to [facility] by EMS for afib with RVR. 04/10/15   Cardiac catheterization in 2006 showed non obstructive CAD. Had normal Lexiscan in 07/2013. Last seen by Dr [name] in June 2014, at that time in [place]. Intolerance to amiodarone. Echo 09/2012 LV EF of 60-65%, no WM abnormality, grade 1 DD, mild mitral reg, mild tri regur, pulmonary artery peak pressure 54mmg Hg, lipomatous atrial septum. She was in [place] up until few weeks ago when she noticed intermittent exertional upper chest pressure with occasional radiation to left jaw. She fell early April of 2016 on her left shoulder and then she had a right Tibia/Fibula fracture surgery.   04/10/15 she woke up with a upset stomach without nausea or vomiting. She took laxative  for constipation. Then she developed palpitation, generalized discomfort and diaphoresis. She called EMS as she thought she back in Afib. She received SL nitro by EMS for Left chest tightness with mild SOB.  Converted with cardizem  Reviewed echo normal with EF 65%  Started on Eliquis and d/c home  She seems depressed BP ok at home high in office  Taking lotrel at night  Infrequent palpitations      Past Medical History  Diagnosis Date  . Essential hypertension   . Erosive esophagitis   . GERD (gastroesophageal reflux disease)   . Hypercholesterolemia   . Coronary atherosclerosis of native coronary artery     a. Nonobstructive minimal CAD 10/2005.  Marland Kitchen Paroxysmal atrial fibrillation    . Diastolic dysfunction     Grade 1. Ejection fraction 60-65%.  . Osteoporosis   . PONV (postoperative nausea and vomiting)   . Heart murmur   . History of hiatal hernia   . Migraine     "used to have them right bad; I don't now" (04/11/2015)  . Vertigo   . Arthritis     "right leg" (04/11/2015)  . Anxiety   . Depression     Past Surgical History  Procedure Laterality Date  . Total abdominal hysterectomy    . Prolapsed uterine fibroid ligation  2015  . Cataract extraction w/ intraocular lens  implant, bilateral Bilateral   . Open reduction internal fixation (orif) tibia/fibula fracture Right 2013    broke tibia and fibula after falling down stairs  . Esophagogastroduodenoscopy (egd) with esophageal dilation  2001    Dr. [name]: erosive esophagitis, esophageal stricture, duodenitis, s/p Savary dilation  . Colonoscopy  2008    Dr. [name]: internal hemorrhoids   . Esophagogastroduodenoscopy (egd) with esophageal dilation  11/15/2012    [id number] web was found & MOST LIKELY CAUSE FOR DYAPHAGIA/Polyp was found in the gastric body and gastric fundus/ gastritis on bx  . Bravo ph study  11/15/2012    Procedure: BRAVO [place] STUDY;  Surgeon: [name], MD;  [facility];  Service: Endoscopy;;  . Flexible sigmoidoscopy N/A 08/22/2014  Procedure: FLEXIBLE SIGMOIDOSCOPY;  Surgeon: [name], MD;  Location: [facility];  Service: Endoscopy;  Laterality: N/A;  830  . Hemorrhoid banding N/A 08/22/2014    Procedure: HEMORRHOID BANDING;  Surgeon: [name], MD;  Location: [facility];  Service: Endoscopy;  Laterality: N/A;  . Fracture surgery    . Eye surgery Bilateral     "laser OR after cataract OR; cause I couldn't see"  . Dilation and curettage of uterus       Current Outpatient Prescriptions  Medication Sig Dispense Refill  . ALPRAZolam (XANAX) 0.25 MG tablet TAKE 1 TABLET BY MOUTH TWICE A DAY AS NEEDED FOR ANXIETY, NERVES, OR SLEEP 60 tablet 3  .  amLODipine-benazepril (LOTREL) 10-20 MG per capsule Take 1 capsule by mouth daily.    Marland Kitchen apixaban (ELIQUIS) 5 MG TABS tablet Take 1 tablet (5 mg total) by mouth 2 (two) times daily. 180 tablet 3  . atenolol (TENORMIN) 50 MG tablet Take 1 tablet (50 mg total) by mouth daily. 90 tablet 1  . calcium citrate-vitamin D (CITRACAL+D) 315-200 MG-UNIT per tablet Take 2 tablets by mouth every morning.    . cholecalciferol (VITAMIN D) 1000 UNITS tablet Take 1,000 Units by mouth daily.    . Multiple Vitamin (MULTIVITAMIN) capsule Take 1 capsule by mouth every evening.    Marland Kitchen omeprazole (PRILOSEC) 20 MG capsule Take 1 capsule (20 mg total) by mouth 2 (two) times daily. (Patient taking differently: Take 20 mg by mouth daily. ) 180 capsule 4  . simvastatin (ZOCOR) 40 MG tablet Take 1 tablet (40 mg total) by mouth every evening. 90 tablet 4   No current facility-administered medications for this visit.    Allergies:   Crestor; Iodine; Iohexol; Morphine; Risedronate sodium; Triamterene-hctz; and Atorvastatin    Social History:  The patient  reports that she has never smoked. She has never used smokeless tobacco. She reports that she does not drink alcohol or use illicit drugs.   Family History:  The patient's family history includes Arthritis in her sister; Cancer in her sister; Cancer - Other in her sister; Colon cancer in her mother; Diabetes in her sister and son; Heart disease in her father and mother; Hip fracture in her mother; Hyperlipidemia in her brother; Hypertension in her brother; Osteoporosis in her mother and sister; Stroke in her brother, sister, and sister.    ROS:  Please see the history of present illness.   Otherwise, review of systems are positive for none.   All other systems are reviewed and negative.    PHYSICAL EXAM: VS:  BP 174/116 mmHg  Pulse 72  Ht 5\' 1"  (1.549 m)  Wt 62.959 kg (138 lb 12.8 oz)  BMI 26.24 kg/m2  SpO2 96% , BMI Body mass index is 26.24 kg/(m^2). Affect  appropriate Healthy:  appears stated age 54: normal Neck supple with no adenopathy JVP normal no bruits no thyromegaly Lungs clear with no wheezing and good diaphragmatic motion Heart:  S1/S2 no murmur, no rub, gallop or click PMI normal Abdomen: benighn, BS positve, no tenderness, no AAA no bruit.  No HSM or HJR Distal pulses intact with no bruits No edema Neuro non-focal Skin warm and dry No muscular weakness    EKG:   afib rate 158 nonspecific ST changes  04/11/15     Recent Labs: 02/28/2015: ALT 11 04/10/2015: Magnesium 2.1; TSH 1.483 04/11/2015: BUN 9; Creatinine, Ser 0.91; Hemoglobin 13.0; Platelets 205; Potassium 4.0; Sodium 137    Lipid  Panel    Component Value Date/Time   CHOL 162 04/11/2015 0410   CHOL 211* 11/26/2014 0841   CHOL 192 05/15/2013 0829   TRIG 131 04/11/2015 0410   TRIG 258* 03/19/2014 0812   TRIG 179* 05/15/2013 0829   HDL 43 04/11/2015 0410   HDL 45 11/26/2014 0841   HDL 45 03/19/2014 0812   HDL 44 05/15/2013 0829   CHOLHDL 3.8 04/11/2015 0410   CHOLHDL 4.7* 11/26/2014 0841   VLDL 26 04/11/2015 0410   LDLCALC 93 04/11/2015 0410   LDLCALC 117* 11/26/2014 0841   LDLCALC 101* 03/19/2014 0812   LDLCALC 112* 05/15/2013 0829      Wt Readings from Last 3 Encounters:  05/02/15 62.959 kg (138 lb 12.8 oz)  04/21/15 63.05 kg (139 lb)  04/11/15 61.417 kg (135 lb 6.4 oz)      Other studies Reviewed: Additional studies/ records that were reviewed today include: Epic notes DC summary and ECG's.    ASSESSMENT AND PLAN:  1.  PAF:  Continue atenolol and normal Myovue 6/13 normal no ischemia  Hesitant to start AAD 3. Chol:  On statin  4. HTN:  Increase atenolol f/u me next available take lotrel in afternoon continue to monitor at home     Current medicines are reviewed at length with the patient today.  The patient does not have concerns regarding medicines.  The following changes have been made:  Increase atenolol 50 mg   Labs/ tests  ordered today include:  None    F/u with me next [place] day    Signed, Jenkins Rouge, MD  05/02/2015 10:06 AM    [facility] [place], [place], [place]  [address] Phone: [phone]; Fax: [phone]

## 2015-05-02 NOTE — Addendum Note (Signed)
Addended by: Barbarann Ehlers A on: 05/02/2015 10:10 AM   Modules accepted: Orders

## 2015-05-22 ENCOUNTER — Telehealth: Payer: Self-pay | Admitting: Cardiovascular Disease

## 2015-05-22 MED ORDER — RIVAROXABAN 20 MG PO TABS: 20.0000 mg | ORAL_TABLET | Freq: Every day | ORAL | Status: DC

## 2015-05-22 NOTE — Telephone Encounter (Signed)
New message    Pt c/o medication issue:  1. Name of Medication: Eliquis  2. How are you currently taking this medication (dosage and times per day)? 2 times a day 5mg   3. Are you having a reaction (difficulty breathing--STAT)? Chest tightness, headaches, shaky, weak  4. What is your medication issue? Pt is having reaction to the medication  Please call to discuss

## 2015-05-22 NOTE — Telephone Encounter (Signed)
Spoke to PT and told her to stop eliquis and start xarelto 20 mg. I will send it to her pharmacy

## 2015-05-22 NOTE — Telephone Encounter (Signed)
Can stop eliquis and try xarelto 20 mg and take it at night when she goes to bed

## 2015-05-22 NOTE — Telephone Encounter (Signed)
Pt is having trouble w/ her Eliquis --states it's making her shakey and feel weak

## 2015-05-22 NOTE — Telephone Encounter (Signed)
Sent to [facility] Triage

## 2015-05-30 ENCOUNTER — Ambulatory Visit (INDEPENDENT_AMBULATORY_CARE_PROVIDER_SITE_OTHER): Payer: Commercial Managed Care - HMO | Admitting: Cardiovascular Disease

## 2015-05-30 ENCOUNTER — Encounter: Payer: Self-pay | Admitting: Cardiovascular Disease

## 2015-05-30 VITALS — BP 138/70 | HR 75 | Ht 61.0 in | Wt 138.0 lb

## 2015-05-30 DIAGNOSIS — Z136 Encounter for screening for cardiovascular disorders: Secondary | ICD-10-CM | POA: Diagnosis not present

## 2015-05-30 NOTE — Patient Instructions (Signed)
Your physician wants you to follow-up in: 6 months with Dr. [name]. You will receive a reminder letter in the mail two months in advance. If you don't receive a letter, please call our office to schedule the follow-up appointment.  Your physician recommends that you continue on your current medications as directed. Please refer to the Current Medication list given to you today.  You have been given samples of Xarelto today  Thank you for choosing [facility]!

## 2015-05-30 NOTE — Progress Notes (Signed)
Patient ID: Norma Barajas, female   DOB: [birth year] Barajas, 74 y.o.   MRN: [id number]     Cardiology Office Note   Date:  05/30/2015   ID:  Norma Barajas, Norma Barajas Norma Barajas, Norma Barajas, MRN [id number]  PCP:  Sharion Balloon, FNP  Cardiologist:   Jenkins Rouge, MD   No chief complaint on file.     History of Present Illness: Norma Barajas is a 74 y.o. female who presents post hospital for PAF  I last saw her in 2014  History of non obstructive CAD, mild mitral regurgitation, paroxysmal atrial flutter, HTN, HL, anxiety who brought to [facility] by EMS for afib with RVR. 04/10/15   Cardiac catheterization in 2006 showed non obstructive CAD. Had normal Lexiscan in 07/2013. Last seen by Dr [name] in June 2014, at that time in [place]. Intolerance to amiodarone. Echo 09/2012 LV EF of 60-65%, no WM abnormality, grade 1 DD, mild mitral reg, mild tri regur, pulmonary artery peak pressure 16mmg Hg, lipomatous atrial septum. She was in [place] up until few weeks ago when she noticed intermittent exertional upper chest pressure with occasional radiation to left jaw. She fell early April of 2016 on her left shoulder and then she had a right Tibia/Fibula fracture surgery.   04/10/15 she woke up with a upset stomach without nausea or vomiting. She took laxative  for constipation. Then she developed palpitation, generalized discomfort and diaphoresis. She called EMS as she thought she back in Afib. She received SL nitro by EMS for Left chest tightness with mild SOB.  Converted with cardizem  Reviewed echo normal with EF 65%  Started on Eliquis and d/c home  BP better at home and she is less anxious   Infrequent palpitations      Past Medical History  Diagnosis Date  . Essential hypertension   . Erosive esophagitis   . GERD (gastroesophageal reflux disease)   . Hypercholesterolemia   . Coronary atherosclerosis of native coronary artery     a. Nonobstructive minimal CAD 10/2005.  Marland Kitchen Paroxysmal atrial fibrillation   . Diastolic dysfunction       Grade 1. Ejection fraction 60-65%.  . Osteoporosis   . PONV (postoperative nausea and vomiting)   . Heart murmur   . History of hiatal hernia   . Migraine     "used to have them right bad; I don't now" (04/11/2015)  . Vertigo   . Arthritis     "right leg" (04/11/2015)  . Anxiety   . Depression     Past Surgical History  Procedure Laterality Date  . Total abdominal hysterectomy    . Prolapsed uterine fibroid ligation  2015  . Cataract extraction w/ intraocular lens  implant, bilateral Bilateral   . Open reduction internal fixation (orif) tibia/fibula fracture Right 2013    broke tibia and fibula after falling down stairs  . Esophagogastroduodenoscopy (egd) with esophageal dilation  2001    Dr. [name]: erosive esophagitis, esophageal stricture, duodenitis, s/p Savary dilation  . Colonoscopy  2008    Dr. [name]: internal hemorrhoids   . Esophagogastroduodenoscopy (egd) with esophageal dilation  11/15/2012    LZJ:[id number] web was found & MOST LIKELY CAUSE FOR DYAPHAGIA/Polyp was found in the gastric body and gastric fundus/ gastritis on bx  . Bravo ph study  11/15/2012    Procedure: BRAVO [place] STUDY;  Surgeon: [name], MD;  [facility];  Service: Endoscopy;;  . Flexible sigmoidoscopy N/A 08/22/2014    Procedure: FLEXIBLE SIGMOIDOSCOPY;  Surgeon: [name], MD;  [facility];  Service: Endoscopy;  Laterality: N/A;  830  . Hemorrhoid banding N/A 08/22/2014    Procedure: HEMORRHOID BANDING;  Surgeon: [name], MD;  [facility];  Service: Endoscopy;  Laterality: N/A;  . Fracture surgery    . Eye surgery Bilateral     "laser OR after cataract OR; cause I couldn't see"  . Dilation and curettage of uterus       Current Outpatient Prescriptions  Medication Sig Dispense Refill  . ALPRAZolam (XANAX) 0.25 MG tablet TAKE 1 TABLET BY MOUTH TWICE A DAY AS NEEDED FOR ANXIETY, NERVES, OR SLEEP 60 tablet 3  . amLODipine-benazepril (LOTREL) 10-Barajas  MG per capsule Take 1 capsule by mouth daily. 90 capsule 3  . atenolol (TENORMIN) 50 MG tablet Take 1 tablet (50 mg total) by mouth daily. 90 tablet 3  . calcium citrate-vitamin D (CITRACAL+D) 315-200 MG-UNIT per tablet Take 2 tablets by mouth every morning.    . cholecalciferol (VITAMIN D) 1000 UNITS tablet Take 1,000 Units by mouth daily.    . Multiple Vitamin (MULTIVITAMIN) capsule Take 1 capsule by mouth every evening.    Marland Kitchen omeprazole (PRILOSEC) Barajas MG capsule Take 1 capsule (Barajas mg total) by mouth 2 (two) times daily. (Patient taking differently: Take Barajas mg by mouth daily. ) 180 capsule 4  . rivaroxaban (XARELTO) Barajas MG TABS tablet Take 1 tablet (Barajas mg total) by mouth daily with supper. 30 tablet 6  . simvastatin (ZOCOR) 40 MG tablet Take 1 tablet (40 mg total) by mouth every evening. 90 tablet 4   No current facility-administered medications for this visit.    Allergies:   Crestor; Iodine; Iohexol; Morphine; Risedronate sodium; Triamterene-hctz; and Atorvastatin    Social History:  The patient  reports that she has never smoked. She has never used smokeless tobacco. She reports that she does not drink alcohol or use illicit drugs.   Family History:  The patient's family history includes Arthritis in her sister; Cancer in her sister; Cancer - Other in her sister; Colon cancer in her mother; Diabetes in her sister and son; Heart disease in her father and mother; Hip fracture in her mother; Hyperlipidemia in her brother; Hypertension in her brother; Osteoporosis in her mother and sister; Stroke in her brother, sister, and sister.    ROS:  Please see the history of present illness.   Otherwise, review of systems are positive for none.   All other systems are reviewed and negative.    PHYSICAL EXAM: VS:  BP 138/70 mmHg  Pulse 75  Ht 5\' 1"  (1.549 m)  Wt 62.596 kg (138 lb)  BMI 26.09 kg/m2  SpO2 97% , BMI Body mass index is 26.09 kg/(m^2). Affect appropriate Healthy:  appears stated  age 54: normal Neck supple with no adenopathy JVP normal no bruits no thyromegaly Lungs clear with no wheezing and good diaphragmatic motion Heart:  S1/S2 no murmur, no rub, gallop or click PMI normal Abdomen: benighn, BS positve, no tenderness, no AAA no bruit.  No HSM or HJR Distal pulses intact with no bruits No edema Neuro non-focal Skin warm and dry No muscular weakness    EKG:   afib rate 158 nonspecific ST changes  04/11/15     Recent Labs: 02/28/2015: ALT 11 04/10/2015: Magnesium 2.1; TSH 1.483 04/11/2015: BUN 9; Creatinine, Ser 0.91; Hemoglobin 13.0; Platelets 205; Potassium 4.0; Sodium 137    Lipid Panel    Component Value  Date/Time   CHOL 162 04/11/2015 0410   CHOL 211* 11/26/2014 0841   CHOL 192 05/15/2013 0829   TRIG 131 04/11/2015 0410   TRIG 258* 03/19/2014 0812   TRIG 179* 05/15/2013 0829   HDL 43 04/11/2015 0410   HDL 45 11/26/2014 0841   HDL 45 03/19/2014 0812   HDL 44 05/15/2013 0829   CHOLHDL 3.8 04/11/2015 0410   CHOLHDL 4.7* 11/26/2014 0841   VLDL 26 04/11/2015 0410   LDLCALC 93 04/11/2015 0410   LDLCALC 117* 11/26/2014 0841   LDLCALC 101* 03/19/2014 0812   LDLCALC 112* 05/15/2013 0829      Wt Readings from Last 3 Encounters:  05/30/15 62.596 kg (138 lb)  05/02/15 62.959 kg (138 lb 12.8 oz)  04/21/15 63.05 kg (139 lb)      Other studies Reviewed: Additional studies/ records that were reviewed today include: Epic notes DC summary and ECG's.    ASSESSMENT AND PLAN:  1.  PAF:  Continue atenolol and normal Myovue 6/13 normal no ischemia  Hesitant to start AAD 3. Chol:  On statin  4. HTN:  Improved with increased atenolol      Current medicines are reviewed at length with the patient today.  The patient does not have concerns regarding medicines.  The following changes have been made:  None   Labs/ tests ordered today include:  None    F/u with me [place] 6 months     Signed, Jenkins Rouge, MD  05/30/2015 11:51 AM    [facility] [place], [place], [place]  [address] Phone: [phone]; Fax: [phone]

## 2015-06-02 ENCOUNTER — Encounter: Payer: Self-pay | Admitting: Family

## 2015-06-02 ENCOUNTER — Ambulatory Visit (INDEPENDENT_AMBULATORY_CARE_PROVIDER_SITE_OTHER): Payer: Commercial Managed Care - HMO | Admitting: Family

## 2015-06-02 VITALS — BP 136/84 | HR 66 | Temp 97.0°F | Ht 61.0 in | Wt 137.8 lb

## 2015-06-02 DIAGNOSIS — I2510 Atherosclerotic heart disease of native coronary artery without angina pectoris: Secondary | ICD-10-CM

## 2015-06-02 DIAGNOSIS — F411 Generalized anxiety disorder: Secondary | ICD-10-CM | POA: Diagnosis not present

## 2015-06-02 DIAGNOSIS — E785 Hyperlipidemia, unspecified: Secondary | ICD-10-CM

## 2015-06-02 DIAGNOSIS — E559 Vitamin D deficiency, unspecified: Secondary | ICD-10-CM | POA: Diagnosis not present

## 2015-06-02 DIAGNOSIS — I480 Paroxysmal atrial fibrillation: Secondary | ICD-10-CM | POA: Diagnosis not present

## 2015-06-02 DIAGNOSIS — K219 Gastro-esophageal reflux disease without esophagitis: Secondary | ICD-10-CM

## 2015-06-02 DIAGNOSIS — I10 Essential (primary) hypertension: Secondary | ICD-10-CM | POA: Diagnosis not present

## 2015-06-02 DIAGNOSIS — M810 Age-related osteoporosis without current pathological fracture: Secondary | ICD-10-CM | POA: Diagnosis not present

## 2015-06-02 MED ORDER — RIVAROXABAN 20 MG PO TABS: 20.0000 mg | ORAL_TABLET | Freq: Every day | ORAL | Status: DC

## 2015-06-02 MED ORDER — ALPRAZOLAM 0.25 MG PO TABS: ORAL_TABLET | ORAL | Status: DC

## 2015-06-02 NOTE — Patient Instructions (Signed)

## 2015-06-02 NOTE — Progress Notes (Signed)
Subjective:    Patient ID: Norma Barajas, female    DOB: [birth year], 74 y.o.   MRN: [id number]  Pt presents to the office today for chronic follow up. Pt was in admitted to the hospital in June for A Fib. Pt reports feeling well today. Pt was placed on xarelto and is followed her Cardiologist  Hypertension This is a chronic problem. The current episode started more than 1 year ago. The problem has been resolved since onset. The problem is controlled. Associated symptoms include anxiety. Pertinent negatives include no headaches, palpitations, peripheral edema or shortness of breath. Risk factors for coronary artery disease include dyslipidemia, family history and post-menopausal state. Past treatments include beta blockers and calcium channel blockers. The current treatment provides significant improvement. Hypertensive end-organ damage includes CAD/MI. There is no history of kidney disease, CVA, heart failure or a thyroid problem. There is no history of sleep apnea.  Hyperlipidemia This is a chronic problem. The current episode started more than 1 year ago. The problem is controlled. Recent lipid tests were reviewed and are normal. She has no history of diabetes or hypothyroidism. Pertinent negatives include no focal sensory loss, leg pain or shortness of breath. Current antihyperlipidemic treatment includes statins. The current treatment provides moderate improvement of lipids. Risk factors for coronary artery disease include dyslipidemia, hypertension, post-menopausal, a sedentary lifestyle and family history.  Gastrophageal Reflux She reports no belching, no coughing, no heartburn, no sore throat or no tooth decay. This is a chronic problem. The current episode started more than 1 year ago. The problem occurs rarely. The problem has been resolved. The symptoms are aggravated by certain foods. Pertinent negatives include no fatigue or muscle weakness. She has tried a PPI for the symptoms. The treatment  provided significant relief.  Anxiety Presents for follow-up visit. Patient reports no depressed mood, excessive worry, hyperventilation, irritability, nervous/anxious behavior, palpitations, restlessness or shortness of breath. Symptoms occur occasionally. The severity of symptoms is mild. The symptoms are aggravated by family issues.   Her past medical history is significant for anxiety/panic attacks and arrhythmia. There is no history of depression. Past treatments include benzodiazephines.      Review of Systems  Constitutional: Negative.  Negative for irritability and fatigue.  HENT: Negative.  Negative for sore throat.   Eyes: Negative.   Respiratory: Negative.  Negative for cough and shortness of breath.   Cardiovascular: Negative.  Negative for palpitations.  Gastrointestinal: Negative.  Negative for heartburn.  Endocrine: Negative.   Genitourinary: Negative.   Musculoskeletal: Negative.  Negative for muscle weakness.  Neurological: Negative.  Negative for headaches.  Hematological: Negative.   Psychiatric/Behavioral: Negative.  The patient is not nervous/anxious.   All other systems reviewed and are negative.      Objective:   Physical Exam  Constitutional: She is oriented to person, place, and time. She appears well-developed and well-nourished. No distress.  HENT:  Head: Normocephalic and atraumatic.  Right Ear: External ear normal.  Mouth/Throat: Oropharynx is clear and moist.  Eyes: Pupils are equal, round, and reactive to light.  Neck: Normal range of motion. Neck supple. No thyromegaly present.  Cardiovascular: Normal rate, regular rhythm, normal heart sounds and intact distal pulses.   No murmur heard. Pulmonary/Chest: Effort normal and breath sounds normal. No respiratory distress. She has no wheezes.  Abdominal: Soft. Bowel sounds are normal. She exhibits no distension. There is no tenderness.  Musculoskeletal: Normal range of motion. She exhibits no edema or  tenderness.  Neurological: She is  alert and oriented to person, place, and time. She has normal reflexes. No cranial nerve deficit.  Skin: Skin is warm and dry.  Psychiatric: She has a normal mood and affect. Her behavior is normal. Judgment and thought content normal.  Vitals reviewed.    BP 136/84 mmHg  Pulse 66  Temp(Src) 97 F (36.1 C) (Oral)  Ht 5\' 1"  (1.549 m)  Wt 137 lb 12.8 oz (62.506 kg)  BMI 26.05 kg/m2      Assessment & Plan:  1. Essential hypertension  2. Atherosclerosis of native coronary artery of native heart without angina pectoris  3. Gastroesophageal reflux disease without esophagitis  4. Osteoporosis  5. Hyperlipidemia  6. GAD (generalized anxiety disorder) - ALPRAZolam (XANAX) 0.25 MG tablet; TAKE 1 TABLET BY MOUTH TWICE A DAY AS NEEDED FOR ANXIETY, NERVES, OR SLEEP  Dispense: 60 tablet; Refill: 3  7. Vitamin D deficiency  8. PAF (paroxysmal atrial fibrillation) - rivaroxaban (XARELTO) 20 MG TABS tablet; Take 1 tablet (20 mg total) by mouth daily with supper.  Dispense: 90 tablet; Refill: 3    Continue all meds Labs drawn at hospital on 04/11/15- Discussed in length Health Maintenance reviewed Diet and exercise encouraged RTO 3 months  Evelina Dun, FNP

## 2015-07-02 ENCOUNTER — Other Ambulatory Visit: Payer: Self-pay | Admitting: *Deleted

## 2015-07-02 DIAGNOSIS — I480 Paroxysmal atrial fibrillation: Secondary | ICD-10-CM

## 2015-07-02 MED ORDER — RIVAROXABAN 20 MG PO TABS: 20.0000 mg | ORAL_TABLET | Freq: Every day | ORAL | Status: DC

## 2015-07-08 ENCOUNTER — Other Ambulatory Visit: Payer: Self-pay | Admitting: General Practice

## 2015-07-09 ENCOUNTER — Other Ambulatory Visit: Payer: Self-pay | Admitting: Family

## 2015-07-09 ENCOUNTER — Encounter: Payer: Self-pay | Admitting: *Deleted

## 2015-07-09 MED ORDER — AMLODIPINE BESYLATE 10 MG PO TABS: ORAL_TABLET | ORAL | Status: DC

## 2015-07-09 NOTE — Telephone Encounter (Signed)
Done, pt aware.

## 2015-07-29 ENCOUNTER — Encounter: Payer: Self-pay | Admitting: Family

## 2015-07-29 ENCOUNTER — Telehealth: Payer: Self-pay | Admitting: Cardiovascular Disease

## 2015-07-29 ENCOUNTER — Ambulatory Visit (INDEPENDENT_AMBULATORY_CARE_PROVIDER_SITE_OTHER): Payer: Commercial Managed Care - HMO | Admitting: Family

## 2015-07-29 VITALS — BP 155/87 | HR 71 | Temp 97.3°F | Ht 61.0 in | Wt 136.0 lb

## 2015-07-29 DIAGNOSIS — M545 Low back pain: Secondary | ICD-10-CM

## 2015-07-29 DIAGNOSIS — I4891 Unspecified atrial fibrillation: Secondary | ICD-10-CM

## 2015-07-29 NOTE — Progress Notes (Signed)
   Subjective:    Patient ID: Norma Barajas, female    DOB: 1940/12/06, 74 y.o.   MRN: [id number]  Pt presents to the office today for back pain. Pt states since starting xarelto two months ago for A Fib her back has been hurting. Pt states she has been on eliquis, but it made her weak and headache.  Back Pain This is a new problem. The current episode started more than 1 month ago (two months). The problem occurs constantly. The problem has been waxing and waning since onset. The pain is present in the lumbar spine and thoracic spine. The quality of the pain is described as aching. The pain is at a severity of 10/10. The pain is moderate. The symptoms are aggravated by bending and standing. Pertinent negatives include no bladder incontinence, bowel incontinence, dysuria, headaches, leg pain, numbness, tingling, weakness or weight loss. Risk factors include menopause. She has tried NSAIDs and heat for the symptoms. The treatment provided mild relief.      Review of Systems  Constitutional: Negative.  Negative for weight loss.  HENT: Negative.   Eyes: Negative.   Respiratory: Negative.  Negative for shortness of breath.   Cardiovascular: Negative.  Negative for palpitations.  Gastrointestinal: Negative.  Negative for bowel incontinence.  Endocrine: Negative.   Genitourinary: Negative.  Negative for bladder incontinence and dysuria.  Musculoskeletal: Positive for back pain.  Neurological: Negative.  Negative for tingling, weakness, numbness and headaches.  Hematological: Negative.   Psychiatric/Behavioral: Negative.   All other systems reviewed and are negative.      Objective:   Physical Exam  Constitutional: She is oriented to person, place, and time. She appears well-developed and well-nourished. No distress.  HENT:  Head: Normocephalic and atraumatic.  Right Ear: External ear normal.  Left Ear: External ear normal.  Mouth/Throat: Oropharynx is clear and moist.  Eyes: Pupils are  equal, round, and reactive to light.  Neck: Normal range of motion. Neck supple. No thyromegaly present.  Cardiovascular: Normal rate, regular rhythm, normal heart sounds and intact distal pulses.   No murmur heard. Pulmonary/Chest: Effort normal and breath sounds normal. No respiratory distress. She has no wheezes.  Abdominal: Soft. Bowel sounds are normal. She exhibits no distension. There is no tenderness.  Musculoskeletal: Normal range of motion. She exhibits no edema or tenderness.  Neurological: She is alert and oriented to person, place, and time. She has normal reflexes. No cranial nerve deficit.  Skin: Skin is warm and dry.  Psychiatric: She has a normal mood and affect. Her behavior is normal. Judgment and thought content normal.  Vitals reviewed.   BP 155/87 mmHg  Pulse 71  Temp(Src) 97.3 F (36.3 C) (Oral)  Ht 5\' 1"  (1.549 m)  Wt 136 lb (61.689 kg)  BMI 25.71 kg/m2      Assessment & Plan:  1. Atrial fibrillation with rapid ventricular response - Ambulatory referral to Cardiology  2. Midline low back pain, with sciatica presence unspecified  Pt to follow up with Cardiologists- Pt wants to stop Xarelto because she believes this is causing her chronic back pain Pt states she does not want any prescription pain medication at this time RTO prn  Evelina Dun, FNP

## 2015-07-29 NOTE — Telephone Encounter (Signed)
She was on eliquis 5 mg bid was this too expensive if not go back to it instead of xarelto

## 2015-07-29 NOTE — Patient Instructions (Signed)

## 2015-07-29 NOTE — Telephone Encounter (Signed)
Patient states that she wants to come off of Xarelto because it is making her back hurt. / tg

## 2015-07-29 NOTE — Telephone Encounter (Signed)
Patients reports having back pains for 1 week. She states that Xarelto is the only new medicine that she has started. Patient denies injury to back. Please advise.

## 2015-07-31 ENCOUNTER — Ambulatory Visit (INDEPENDENT_AMBULATORY_CARE_PROVIDER_SITE_OTHER): Payer: Commercial Managed Care - HMO

## 2015-07-31 ENCOUNTER — Encounter: Payer: Self-pay | Admitting: Family Medicine

## 2015-07-31 ENCOUNTER — Ambulatory Visit (INDEPENDENT_AMBULATORY_CARE_PROVIDER_SITE_OTHER): Payer: Commercial Managed Care - HMO | Admitting: Family Medicine

## 2015-07-31 VITALS — BP 141/81 | HR 65 | Temp 97.2°F | Ht 61.0 in | Wt 136.4 lb

## 2015-07-31 DIAGNOSIS — M546 Pain in thoracic spine: Secondary | ICD-10-CM

## 2015-07-31 DIAGNOSIS — I4891 Unspecified atrial fibrillation: Secondary | ICD-10-CM | POA: Diagnosis not present

## 2015-07-31 MED ORDER — AMLODIPINE BESYLATE 10 MG PO TABS: 10.0000 mg | ORAL_TABLET | Freq: Every day | ORAL | Status: DC

## 2015-07-31 MED ORDER — WARFARIN SODIUM 3 MG PO TABS: 3.0000 mg | ORAL_TABLET | Freq: Every day | ORAL | Status: DC

## 2015-07-31 MED ORDER — BACLOFEN 10 MG PO TABS: 5.0000 mg | ORAL_TABLET | Freq: Three times a day (TID) | ORAL | Status: DC

## 2015-07-31 NOTE — Progress Notes (Addendum)
HPI  Patient presents today for evaluation of back pain  Patient spines with back pain is now continued for about 2 weeks. She describes it as an achy with sharp intermittent pains originating from her bilateral paraspinal muscles in the thoracic area. She denies any loss of function of her legs or arms as well as any weakness. She has mild improvement with Tylenol and heat.  She has severe osteoporosis with a T score of -3.7  She states that the pain came on without injury and has persisted. It comes and goes and is worse with specific movements.  She felt that the pain is attributed to Xarelto, as she and her husband have read that if you have a spinal block intake Xarelto it may cause back pain. I explained that as she started Xarelto 2 years after her spinal block it's likely unrelated, however she refuses.  She would like to start Coumadin today.  PMH: Smoking status noted ROS: Per HPI  Objective: BP 141/81 mmHg  Pulse 65  Temp(Src) 97.2 F (36.2 C) (Oral)  Ht 5' 1"  (1.549 m)  Wt 136 lb 6.4 oz (61.871 kg)  BMI 25.79 kg/m2 Gen: NAD, alert, cooperative with exam HEENT: NCAT CV: RRR, good S1/S2, no murmur Resp: CTABL, no wheezes, non-labored Abd: SNTND, BS present, no guarding or organomegaly Ext: No edema, warm Neuro: Alert and oriented, No gross deficits Musculoskeletal: No midline or bony tenderness of the thoracic or lumbar spine, no paraspinal muscle tenderness on palpation  DG lumbar and thoracic spine 07/31/2015: Some mild sclerosis indicating osteoarthritis, scoliosis, no acute fracture  Assessment and plan:  # Back pain Given age and osteoporosis I think she is at high risk for compression fracture, however her pain sounds more consistent with muscle spasms. Trial of baclofen Continue Tylenol, continue heat  # A. fib Chads 2 Vasc score is 4, I do not believe that she is a significant fall risk. She was to discuss aspirin versus anticoagulation, considering  her chads 2 vasc score I recommended anticoagulation. Start Coumadin today 3 mg daily, follow-up with clinical pharmacist in 4 days. CMP and CBC, last liver function and hemoglobin were normal.   Orders Placed This Encounter  Procedures  . DG Thoracic Spine 2 View    Standing Status: Future     Number of Occurrences:      Standing Expiration Date: 09/29/2016    Order Specific Question:  Reason for Exam (SYMPTOM  OR DIAGNOSIS REQUIRED)    Answer:  low thoracic back pain    Order Specific Question:  Preferred imaging location?    Answer:  Internal  . DG Lumbar Spine 2-3 Views    Standing Status: Future     Number of Occurrences:      Standing Expiration Date: 09/29/2016    Order Specific Question:  Reason for Exam (SYMPTOM  OR DIAGNOSIS REQUIRED)    Answer:  low thoracic back pain    Order Specific Question:  Preferred imaging location?    Answer:  Internal  . CMP14+EGFR  . CBC    Meds ordered this encounter  Medications  . amLODipine (NORVASC) 10 MG tablet    Sig: Take 1 tablet (10 mg total) by mouth daily.    Dispense:  90 tablet    Refill:  3  . warfarin (COUMADIN) 3 MG tablet    Sig: Take 1 tablet (3 mg total) by mouth daily.    Dispense:  30 tablet    Refill:  0  .  baclofen (LIORESAL) 10 MG tablet    Sig: Take 0.5 tablets (5 mg total) by mouth 3 (three) times daily.    Dispense:  30 each    Refill:  0    Laroy Apple, MD [facility] 07/31/2015, 1:31 PM

## 2015-07-31 NOTE — Telephone Encounter (Signed)
I spoke with patient this morning , she still has lowered back pain which has settled in right hip.She is going to see pcp today.She states she will not take eliquis and xarelto. She is familiar with coumadin and wants to start that as her son takes it also.She wants her pcp in [place] to follow her for this.I asked her to call me and let me know if she was startin g coumadin,she agreed to call back

## 2015-07-31 NOTE — Patient Instructions (Addendum)
Great to meet you!  Come back next Monday for an INR check with Tammy   Back Pain, Adult Low back pain is very common. About 1 in 5 people have back pain.The cause of low back pain is rarely dangerous. The pain often gets better over time.About half of people with a sudden onset of back pain feel better in just 2 weeks. About 8 in 10 people feel better by 6 weeks.  CAUSES Some common causes of back pain include:  Strain of the muscles or ligaments supporting the spine.  Wear and tear (degeneration) of the spinal discs.  Arthritis.  Direct injury to the back. DIAGNOSIS Most of the time, the direct cause of low back pain is not known.However, back pain can be treated effectively even when the exact cause of the pain is unknown.Answering your caregiver's questions about your overall health and symptoms is one of the most accurate ways to make sure the cause of your pain is not dangerous. If your caregiver needs more information, he or she may order lab work or imaging tests (X-rays or MRIs).However, even if imaging tests show changes in your back, this usually does not require surgery. HOME CARE INSTRUCTIONS For many people, back pain returns.Since low back pain is rarely dangerous, it is often a condition that people can learn to [facility] their own.   Remain active. It is stressful on the back to sit or stand in one place. Do not sit, drive, or stand in one place for more than 30 minutes at a time. Take short walks on level surfaces as soon as pain allows.Try to increase the length of time you walk each day.  Do not stay in bed.Resting more than 1 or 2 days can delay your recovery.  Do not avoid exercise or work.Your body is made to move.It is not dangerous to be active, even though your back may hurt.Your back will likely heal faster if you return to being active before your pain is gone.  Pay attention to your body when you bend and lift. Many people have less discomfortwhen  lifting if they bend their knees, keep the load close to their bodies,and avoid twisting. Often, the most comfortable positions are those that put less stress on your recovering back.  Find a comfortable position to sleep. Use a firm mattress and lie on your side with your knees slightly bent. If you lie on your back, put a pillow under your knees.  Only take over-the-counter or prescription medicines as directed by your caregiver. Over-the-counter medicines to reduce pain and inflammation are often the most helpful.Your caregiver may prescribe muscle relaxant drugs.These medicines help dull your pain so you can more quickly return to your normal activities and healthy exercise.  Put ice on the injured area.  Put ice in a plastic bag.  Place a towel between your skin and the bag.  Leave the ice on for 15-20 minutes, 03-04 times a day for the first 2 to 3 days. After that, ice and heat may be alternated to reduce pain and spasms.  Ask your caregiver about trying back exercises and gentle massage. This may be of some benefit.  Avoid feeling anxious or stressed.Stress increases muscle tension and can worsen back pain.It is important to recognize when you are anxious or stressed and learn ways to manage it.Exercise is a great option. SEEK MEDICAL CARE IF:  You have pain that is not relieved with rest or medicine.  You have pain that does not  improve in 1 week.  You have new symptoms.  You are generally not feeling well. SEEK IMMEDIATE MEDICAL CARE IF:   You have pain that radiates from your back into your legs.  You develop new bowel or bladder control problems.  You have unusual weakness or numbness in your arms or legs.  You develop nausea or vomiting.  You develop abdominal pain.  You feel faint. Document Released: 10/25/2005 Document Revised: 04/25/2012 Document Reviewed: 02/26/2014 [facility] Patient Information 2015 [place], [place]. This information is not intended to  replace advice given to you by your health care provider. Make sure you discuss any questions you have with your health care provider.

## 2015-08-01 ENCOUNTER — Telehealth: Payer: Self-pay | Admitting: Family Medicine

## 2015-08-01 LAB — CMP14+EGFR
A/G RATIO: 1.5 (ref 1.1–2.5)
ALBUMIN: 4.4 g/dL (ref 3.5–4.8)
ALT: 8 IU/L (ref 0–32)
AST: 19 IU/L (ref 0–40)
Alkaline Phosphatase: 75 IU/L (ref 39–117)
BILIRUBIN TOTAL: 0.2 mg/dL (ref 0.0–1.2)
BUN / CREAT RATIO: 13 (ref 11–26)
BUN: 11 mg/dL (ref 8–27)
CHLORIDE: 95 mmol/L — AB (ref 97–108)
CO2: 24 mmol/L (ref 18–29)
Calcium: 9.2 mg/dL (ref 8.7–10.3)
Creatinine, Ser: 0.85 mg/dL (ref 0.57–1.00)
GFR calc non Af Amer: 67 mL/min/{1.73_m2} (ref >59)
GFR, EST AFRICAN AMERICAN: 77 mL/min/{1.73_m2} (ref >59)
GLOBULIN, TOTAL: 3 g/dL (ref 1.5–4.5)
Glucose: 101 mg/dL — ABNORMAL HIGH (ref 65–99)
POTASSIUM: 4.4 mmol/L (ref 3.5–5.2)
SODIUM: 137 mmol/L (ref 134–144)
TOTAL PROTEIN: 7.4 g/dL (ref 6.0–8.5)

## 2015-08-01 LAB — CBC
Hematocrit: 41.4 % (ref 34.0–46.6)
Hemoglobin: 13.9 g/dL (ref 11.1–15.9)
MCH: 28.5 pg (ref 26.6–33.0)
MCHC: 33.6 g/dL (ref 31.5–35.7)
MCV: 85 fL (ref 79–97)
Platelets: 230 10*3/uL (ref 150–379)
RBC: 4.88 x10E6/uL (ref 3.77–5.28)
RDW: 15.3 % (ref 12.3–15.4)
WBC: 6.8 10*3/uL (ref 3.4–10.8)

## 2015-08-01 NOTE — Telephone Encounter (Signed)
Patient has already got her rx

## 2015-08-04 ENCOUNTER — Ambulatory Visit (INDEPENDENT_AMBULATORY_CARE_PROVIDER_SITE_OTHER): Payer: Commercial Managed Care - HMO | Admitting: Pharmacist

## 2015-08-04 DIAGNOSIS — I480 Paroxysmal atrial fibrillation: Secondary | ICD-10-CM | POA: Diagnosis not present

## 2015-08-04 LAB — POCT INR: INR: 1.2

## 2015-08-04 NOTE — Patient Instructions (Signed)
Anticoagulation Dose Instructions as of 08/04/2015      Norma Barajas Tue Wed Thu Fri Sat   New Dose 3 mg 6 mg 6 mg 3 mg 3 mg 3 mg 3 mg    Description        Take 2 warfarin 3mg  tablets today - Monday, September 26th and Tuesday, September 27th, then restart warfarin 3mg  take 1 tablet daily     INR was 1.2 today (goal is 2.0 to 3.0)   Vitamin K Foods and Warfarin Warfarin is a medicine that helps prevent harmful blood clots by causing blood to clot more slowly. It does this by decreasing the activity of vitamin K, which promotes normal blood clotting. For the dose of warfarin you have been prescribed to work well, you need to get about the same amount of vitamin K from your food from day to day. Suddenly getting a lot more vitamin K could cause your blood to clot too quickly. A sudden decrease in vitamin K intake could cause your blood to clot too slowly. These changes in vitamin K intake could lead to dangerous blood clotsor to bleeding. WHAT GENERAL GUIDELINES DO I NEED TO FOLLOW?  Keep your intake of vitamin K consistent from day to day and week to week.   To do this, you must be aware of which foods contain moderate or high amounts of vitamin K. Listed below are some foods that are very high, high, or moderately high in vitamin K. If you eat these foods, make sure you eat a consistent amount of them from day to day.  Avoid major changes in your diet, or tell your health care [name] before changing your diet.  If you take a multivitamin that contains vitamin K, be sure to take it every day.  If you drink green tea, drink the same amount each day. WHAT FOODS ARE VERY HIGH IN VITAMIN K?   Greens, such as Swiss chard and beet, collard, mustard, or turnip greens (fresh or frozen, cooked).  Kale (fresh or frozen, cooked).   Parsley (raw).  Spinach (cooked).  WHAT FOODS ARE HIGH IN VITAMIN K?  Asparagus (frozen, cooked).  Broccoli.   Bok choy (cooked).   Brussels sprouts (fresh  or frozen, cooked).  Cabbage (cooked).  Coleslaw.  Dark Green leafy vegetables  Cucmber peels - OK to eat if you peel them WHAT FOODS ARE MODERATELY HIGH IN VITAMIN K?  Endive (raw).   Green leaf lettuce (raw).   Green scallions (raw).  Kale (raw).  Okra (frozen, cooked).  Plantains (fried).  Romaine lettuce (raw).   Sauerkraut (canned).  Document Released: 08/22/2009 Document Revised: 10/30/2013 Document Reviewed: 08/29/2013 [facility] Patient Information 2015 [place], [place]. This information is not intended to replace advice given to you by your health care [name]. Make sure you discuss any questions you have with your health care [name].

## 2015-08-07 ENCOUNTER — Encounter: Payer: Self-pay | Admitting: Family Medicine

## 2015-08-07 ENCOUNTER — Ambulatory Visit (INDEPENDENT_AMBULATORY_CARE_PROVIDER_SITE_OTHER): Payer: Commercial Managed Care - HMO | Admitting: Family Medicine

## 2015-08-07 ENCOUNTER — Other Ambulatory Visit (INDEPENDENT_AMBULATORY_CARE_PROVIDER_SITE_OTHER): Payer: Commercial Managed Care - HMO

## 2015-08-07 VITALS — BP 127/78 | HR 66 | Temp 96.8°F | Ht 61.0 in | Wt 135.2 lb

## 2015-08-07 DIAGNOSIS — I480 Paroxysmal atrial fibrillation: Secondary | ICD-10-CM

## 2015-08-07 DIAGNOSIS — M549 Dorsalgia, unspecified: Secondary | ICD-10-CM | POA: Insufficient documentation

## 2015-08-07 DIAGNOSIS — M545 Low back pain, unspecified: Secondary | ICD-10-CM | POA: Insufficient documentation

## 2015-08-07 LAB — POCT INR: INR: 3.1

## 2015-08-07 MED ORDER — PREDNISONE 20 MG PO TABS: ORAL_TABLET | ORAL | Status: DC

## 2015-08-07 NOTE — Patient Instructions (Signed)
Back Pain, Adult Low back pain is very common. About 1 in 5 people have back pain.The cause of low back pain is rarely dangerous. The pain often gets better over time.About half of people with a sudden onset of back pain feel better in just 2 weeks. About 8 in 10 people feel better by 6 weeks.  CAUSES Some common causes of back pain include:  Strain of the muscles or ligaments supporting the spine.  Wear and tear (degeneration) of the spinal discs.  Arthritis.  Direct injury to the back. DIAGNOSIS Most of the time, the direct cause of low back pain is not known.However, back pain can be treated effectively even when the exact cause of the pain is unknown.Answering your caregiver's questions about your overall health and symptoms is one of the most accurate ways to make sure the cause of your pain is not dangerous. If your caregiver needs more information, he or she may order lab work or imaging tests (X-rays or MRIs).However, even if imaging tests show changes in your back, this usually does not require surgery. HOME CARE INSTRUCTIONS For many people, back pain returns.Since low back pain is rarely dangerous, it is often a condition that people can learn to manageon their own.   Remain active. It is stressful on the back to sit or stand in one place. Do not sit, drive, or stand in one place for more than 30 minutes at a time. Take short walks on level surfaces as soon as pain allows.Try to increase the length of time you walk each day.  Do not stay in bed.Resting more than 1 or 2 days can delay your recovery.  Do not avoid exercise or work.Your body is made to move.It is not dangerous to be active, even though your back may hurt.Your back will likely heal faster if you return to being active before your pain is gone.  Pay attention to your body when you bend and lift. Many people have less discomfortwhen lifting if they bend their knees, keep the load close to their bodies,and  avoid twisting. Often, the most comfortable positions are those that put less stress on your recovering back.  Find a comfortable position to sleep. Use a firm mattress and lie on your side with your knees slightly bent. If you lie on your back, put a pillow under your knees.  Only take over-the-counter or prescription medicines as directed by your caregiver. Over-the-counter medicines to reduce pain and inflammation are often the most helpful.Your caregiver may prescribe muscle relaxant drugs.These medicines help dull your pain so you can more quickly return to your normal activities and healthy exercise.  Put ice on the injured area.  Put ice in a plastic bag.  Place a towel between your skin and the bag.  Leave the ice on for 15-20 minutes, 03-04 times a day for the first 2 to 3 days. After that, ice and heat may be alternated to reduce pain and spasms.  Ask your caregiver about trying back exercises and gentle massage. This may be of some benefit.  Avoid feeling anxious or stressed.Stress increases muscle tension and can worsen back pain.It is important to recognize when you are anxious or stressed and learn ways to manage it.Exercise is a great option. SEEK MEDICAL CARE IF:  You have pain that is not relieved with rest or medicine.  You have pain that does not improve in 1 week.  You have new symptoms.  You are generally not feeling well. SEEK   IMMEDIATE MEDICAL CARE IF:   You have pain that radiates from your back into your legs.  You develop new bowel or bladder control problems.  You have unusual weakness or numbness in your arms or legs.  You develop nausea or vomiting.  You develop abdominal pain.  You feel faint. Document Released: 10/25/2005 Document Revised: 04/25/2012 Document Reviewed: 02/26/2014 [employer] Patient Information 2015 [employer]. This information is not intended to replace advice given to you by your health care provider. Make sure you  discuss any questions you have with your health care provider.  

## 2015-08-07 NOTE — Progress Notes (Signed)
   HPI  Patient presents today for follow-up back pain  Describes 3 weeks of right-sided paraspinal muscular back pain worse with movement or twisting. She denies fever, chills sweats. She denies so anesthesia, leg weakness, or bowel or bladder dysfunction. She has tried baclofen and Tylenol without much improvement. She see's Dr. Case for ortho   PMH: Smoking status noted ROS: Per HPI  Objective: BP 127/78 mmHg  Pulse 66  Temp(Src) 96.8 F (36 C) (Oral)  Ht 5\' 1"  (1.549 m)  Wt 135 lb 3.2 oz (61.326 kg)  BMI 25.56 kg/m2 Gen: NAD, alert, cooperative with exam HEENT: NCAT Abd: No CVA tenderness Ext: No edema, warm Neuro: Alert and oriented, No gross deficits MSK: No bony or muscular tenderness to palpation  Assessment and plan:  # Back Pain No red flags except for age Plain film last visit showed scoliosis, no other abnormalities Severe osteoporosis with T score -3.7 No improvement with Tylenol and baclofen Treat with prednisone, I am cautious with this considering her severe osteoporosis, however given her pain a believe it's the next logical step. Follow-up with orthopedic if pain persists referral placed   Meds ordered this encounter  Medications  . predniSONE (DELTASONE) 20 MG tablet    Sig: Take 2 tabs daily for 4 days, then take 1 tab daily for 4 days, then take one half tab daily 4 days then stop.    Dispense:  14 tablet    Refill:  0    Laroy Apple, MD [facility] 08/07/2015, 9:50 AM

## 2015-08-08 ENCOUNTER — Telehealth: Payer: Self-pay | Admitting: Family Medicine

## 2015-08-11 ENCOUNTER — Ambulatory Visit (INDEPENDENT_AMBULATORY_CARE_PROVIDER_SITE_OTHER): Payer: Commercial Managed Care - HMO | Admitting: Pharmacist

## 2015-08-11 DIAGNOSIS — I480 Paroxysmal atrial fibrillation: Secondary | ICD-10-CM

## 2015-08-11 LAB — POCT INR: INR: 2.4

## 2015-08-11 NOTE — Patient Instructions (Signed)
Anticoagulation Dose Instructions as of 08/11/2015      [name] Tue Wed Thu Fri Sat   New Dose 3 mg 1.5 mg 3 mg 1.5 mg 3 mg 1.5 mg 3 mg    Description        Since still taking predisone recommend continue current warfarin dose while taking prednisone to 1/2 tablet Mondays, Wednesdays and Fridays  and 1 tablet all other days.     INR was 2.4 today

## 2015-08-12 ENCOUNTER — Ambulatory Visit: Payer: Self-pay | Admitting: Family Medicine

## 2015-08-14 ENCOUNTER — Ambulatory Visit: Payer: Self-pay | Admitting: Family Medicine

## 2015-08-16 NOTE — Telephone Encounter (Signed)
Pt aware to stop prednisone and come in to be seen if she is not feeling better by Monday per Dr.Bradshaw.

## 2015-08-20 ENCOUNTER — Ambulatory Visit (INDEPENDENT_AMBULATORY_CARE_PROVIDER_SITE_OTHER): Payer: Commercial Managed Care - HMO | Admitting: Pharmacist

## 2015-08-20 DIAGNOSIS — Z23 Encounter for immunization: Secondary | ICD-10-CM

## 2015-08-20 DIAGNOSIS — I480 Paroxysmal atrial fibrillation: Secondary | ICD-10-CM

## 2015-08-20 LAB — POCT INR: INR: 3.2

## 2015-08-20 NOTE — Patient Instructions (Signed)
Anticoagulation Dose Instructions as of 08/20/2015      Sun Mon Tue Wed Thu Fri Sat   New Dose 1.5 mg 3 mg 1.5 mg 3 mg 1.5 mg 3 mg 1.5 mg    Description        No warfarin today - Wednesday, October 12th.  Then decrease warfarin dose to 1 tablet on mondays, wednesdays and fridays.     INR was 3.2 today

## 2015-08-21 ENCOUNTER — Inpatient Hospital Stay (HOSPITAL_COMMUNITY)
Admission: EM | Admit: 2015-08-21 | Discharge: 2015-08-22 | DRG: 308 | Disposition: A | Discharge status: Home / Self Care | Payer: Commercial Managed Care - HMO | Attending: Family Medicine | Admitting: Family Medicine

## 2015-08-21 ENCOUNTER — Ambulatory Visit (INDEPENDENT_AMBULATORY_CARE_PROVIDER_SITE_OTHER): Payer: Commercial Managed Care - HMO | Admitting: Family Medicine

## 2015-08-21 ENCOUNTER — Encounter: Payer: Self-pay | Admitting: Family Medicine

## 2015-08-21 ENCOUNTER — Emergency Department (HOSPITAL_COMMUNITY): Payer: Commercial Managed Care - HMO

## 2015-08-21 ENCOUNTER — Encounter (HOSPITAL_COMMUNITY): Payer: Self-pay | Admitting: *Deleted

## 2015-08-21 VITALS — BP 125/95 | HR 135 | Temp 97.2°F | Ht 61.0 in | Wt 139.4 lb

## 2015-08-21 DIAGNOSIS — J90 Pleural effusion, not elsewhere classified: Secondary | ICD-10-CM | POA: Diagnosis present

## 2015-08-21 DIAGNOSIS — I10 Essential (primary) hypertension: Secondary | ICD-10-CM | POA: Diagnosis present

## 2015-08-21 DIAGNOSIS — R197 Diarrhea, unspecified: Secondary | ICD-10-CM | POA: Diagnosis not present

## 2015-08-21 DIAGNOSIS — I480 Paroxysmal atrial fibrillation: Principal | ICD-10-CM | POA: Insufficient documentation

## 2015-08-21 DIAGNOSIS — Z7901 Long term (current) use of anticoagulants: Secondary | ICD-10-CM

## 2015-08-21 DIAGNOSIS — E785 Hyperlipidemia, unspecified: Secondary | ICD-10-CM | POA: Diagnosis present

## 2015-08-21 DIAGNOSIS — Z823 Family history of stroke: Secondary | ICD-10-CM | POA: Diagnosis not present

## 2015-08-21 DIAGNOSIS — E7800 Pure hypercholesterolemia, unspecified: Secondary | ICD-10-CM | POA: Diagnosis present

## 2015-08-21 DIAGNOSIS — R079 Chest pain, unspecified: Secondary | ICD-10-CM | POA: Diagnosis not present

## 2015-08-21 DIAGNOSIS — I4891 Unspecified atrial fibrillation: Secondary | ICD-10-CM | POA: Diagnosis not present

## 2015-08-21 DIAGNOSIS — Z833 Family history of diabetes mellitus: Secondary | ICD-10-CM | POA: Diagnosis not present

## 2015-08-21 DIAGNOSIS — F419 Anxiety disorder, unspecified: Secondary | ICD-10-CM | POA: Diagnosis not present

## 2015-08-21 DIAGNOSIS — Z8249 Family history of ischemic heart disease and other diseases of the circulatory system: Secondary | ICD-10-CM

## 2015-08-21 DIAGNOSIS — K210 Gastro-esophageal reflux disease with esophagitis: Secondary | ICD-10-CM | POA: Diagnosis present

## 2015-08-21 DIAGNOSIS — J810 Acute pulmonary edema: Secondary | ICD-10-CM | POA: Diagnosis present

## 2015-08-21 DIAGNOSIS — Z90710 Acquired absence of both cervix and uterus: Secondary | ICD-10-CM | POA: Diagnosis not present

## 2015-08-21 DIAGNOSIS — R531 Weakness: Secondary | ICD-10-CM | POA: Diagnosis not present

## 2015-08-21 DIAGNOSIS — E876 Hypokalemia: Secondary | ICD-10-CM | POA: Diagnosis not present

## 2015-08-21 DIAGNOSIS — M810 Age-related osteoporosis without current pathological fracture: Secondary | ICD-10-CM | POA: Diagnosis present

## 2015-08-21 DIAGNOSIS — I2510 Atherosclerotic heart disease of native coronary artery without angina pectoris: Secondary | ICD-10-CM | POA: Diagnosis not present

## 2015-08-21 DIAGNOSIS — R000 Tachycardia, unspecified: Secondary | ICD-10-CM | POA: Diagnosis not present

## 2015-08-21 DIAGNOSIS — R0602 Shortness of breath: Secondary | ICD-10-CM | POA: Diagnosis not present

## 2015-08-21 DIAGNOSIS — R05 Cough: Secondary | ICD-10-CM

## 2015-08-21 DIAGNOSIS — R1084 Generalized abdominal pain: Secondary | ICD-10-CM | POA: Diagnosis not present

## 2015-08-21 DIAGNOSIS — Z800 Family history of malignant neoplasm of digestive organs: Secondary | ICD-10-CM | POA: Diagnosis not present

## 2015-08-21 DIAGNOSIS — R071 Chest pain on breathing: Secondary | ICD-10-CM | POA: Diagnosis not present

## 2015-08-21 DIAGNOSIS — K219 Gastro-esophageal reflux disease without esophagitis: Secondary | ICD-10-CM | POA: Diagnosis present

## 2015-08-21 DIAGNOSIS — R059 Cough, unspecified: Secondary | ICD-10-CM

## 2015-08-21 LAB — HEPATIC FUNCTION PANEL
ALT: 31 U/L (ref 14–54)
AST: 34 U/L (ref 15–41)
Albumin: 3.4 g/dL — ABNORMAL LOW (ref 3.5–5.0)
Alkaline Phosphatase: 80 U/L (ref 38–126)
BILIRUBIN DIRECT: 0.1 mg/dL (ref 0.1–0.5)
BILIRUBIN INDIRECT: 0.5 mg/dL (ref 0.3–0.9)
BILIRUBIN TOTAL: 0.6 mg/dL (ref 0.3–1.2)
Total Protein: 6.6 g/dL (ref 6.5–8.1)

## 2015-08-21 LAB — CBC WITH DIFFERENTIAL/PLATELET
Basophils Absolute: 0 10*3/uL (ref 0.0–0.1)
Basophils Relative: 0 %
EOS PCT: 1 %
Eosinophils Absolute: 0.1 10*3/uL (ref 0.0–0.7)
HCT: 39.2 % (ref 36.0–46.0)
Hemoglobin: 12.6 g/dL (ref 12.0–15.0)
LYMPHS ABS: 1.9 10*3/uL (ref 0.7–4.0)
LYMPHS PCT: 20 %
MCH: 28.4 pg (ref 26.0–34.0)
MCHC: 32.1 g/dL (ref 30.0–36.0)
MCV: 88.5 fL (ref 78.0–100.0)
MONO ABS: 0.9 10*3/uL (ref 0.1–1.0)
MONOS PCT: 10 %
Neutro Abs: 6.3 10*3/uL (ref 1.7–7.7)
Neutrophils Relative %: 69 %
PLATELETS: 188 10*3/uL (ref 150–400)
RBC: 4.43 MIL/uL (ref 3.87–5.11)
RDW: 16.2 % — AB (ref 11.5–15.5)
WBC: 9.2 10*3/uL (ref 4.0–10.5)

## 2015-08-21 LAB — PROTIME-INR
INR: 1.71 — AB (ref 0.00–1.49)
Prothrombin Time: 20 seconds — ABNORMAL HIGH (ref 11.6–15.2)

## 2015-08-21 LAB — BASIC METABOLIC PANEL
Anion gap: 8 (ref 5–15)
BUN: 12 mg/dL (ref 6–20)
CO2: 25 mmol/L (ref 22–32)
Calcium: 8.4 mg/dL — ABNORMAL LOW (ref 8.9–10.3)
Chloride: 105 mmol/L (ref 101–111)
Creatinine, Ser: 1 mg/dL (ref 0.44–1.00)
GFR calc Af Amer: >60 mL/min (ref >60)
GFR calc non Af Amer: 53 mL/min — ABNORMAL LOW (ref >60)
Glucose, Bld: 148 mg/dL — ABNORMAL HIGH (ref 65–99)
Potassium: 3.4 mmol/L — ABNORMAL LOW (ref 3.5–5.1)
Sodium: 138 mmol/L (ref 135–145)

## 2015-08-21 LAB — TROPONIN I: Troponin I: <0.03 ng/mL (ref <0.031)

## 2015-08-21 MED ORDER — OXYCODONE HCL 5 MG PO TABS
5.0000 mg | ORAL_TABLET | ORAL | Status: DC | PRN
  Administered 2015-08-21 – 2015-08-22 (×2): 5 mg via ORAL
  Filled 2015-08-21 (×2): qty 1

## 2015-08-21 MED ORDER — POTASSIUM CHLORIDE CRYS ER 20 MEQ PO TBCR: 40.0000 meq | EXTENDED_RELEASE_TABLET | Freq: Once | ORAL | Status: DC

## 2015-08-21 MED ORDER — SIMVASTATIN 20 MG PO TABS: 40.0000 mg | ORAL_TABLET | Freq: Every evening | ORAL | Status: DC

## 2015-08-21 MED ORDER — ONDANSETRON HCL 4 MG PO TABS: 4.0000 mg | ORAL_TABLET | Freq: Four times a day (QID) | ORAL | Status: DC | PRN

## 2015-08-21 MED ORDER — PANTOPRAZOLE SODIUM 40 MG PO TBEC: 40.0000 mg | DELAYED_RELEASE_TABLET | Freq: Every day | ORAL | Status: DC

## 2015-08-21 MED ORDER — PANTOPRAZOLE SODIUM 40 MG PO TBEC
40.0000 mg | DELAYED_RELEASE_TABLET | Freq: Every day | ORAL | Status: DC
  Administered 2015-08-22: 40 mg via ORAL
  Filled 2015-08-21: qty 1

## 2015-08-21 MED ORDER — ACETAMINOPHEN 325 MG PO TABS: 650.0000 mg | ORAL_TABLET | Freq: Four times a day (QID) | ORAL | Status: DC | PRN

## 2015-08-21 MED ORDER — DEXTROSE 5 % IV SOLN
5.0000 mg/h | Freq: Once | INTRAVENOUS | Status: AC
  Administered 2015-08-21: 5 mg/h via INTRAVENOUS
  Filled 2015-08-21: qty 100

## 2015-08-21 MED ORDER — DILTIAZEM HCL 25 MG/5ML IV SOLN
10.0000 mg | Freq: Once | INTRAVENOUS | Status: AC
  Administered 2015-08-21: 10 mg via INTRAVENOUS

## 2015-08-21 MED ORDER — ALPRAZOLAM 0.25 MG PO TABS
0.2500 mg | ORAL_TABLET | Freq: Two times a day (BID) | ORAL | Status: DC | PRN
  Administered 2015-08-21: 0.25 mg via ORAL
  Filled 2015-08-21: qty 1

## 2015-08-21 MED ORDER — SODIUM CHLORIDE 0.9 % IJ SOLN
3.0000 mL | Freq: Two times a day (BID) | INTRAMUSCULAR | Status: DC
  Administered 2015-08-21: 3 mL via INTRAVENOUS

## 2015-08-21 MED ORDER — SODIUM CHLORIDE 0.9 % IV BOLUS (SEPSIS): 250.0000 mL | Freq: Once | INTRAVENOUS | Status: DC

## 2015-08-21 MED ORDER — SODIUM CHLORIDE 0.9 % IV SOLN
INTRAVENOUS | Status: DC
  Administered 2015-08-22: 07:00:00 via INTRAVENOUS

## 2015-08-21 MED ORDER — AMLODIPINE BESYLATE 5 MG PO TABS
10.0000 mg | ORAL_TABLET | Freq: Every day | ORAL | Status: DC
  Administered 2015-08-22: 10 mg via ORAL
  Filled 2015-08-21: qty 2

## 2015-08-21 MED ORDER — DILTIAZEM HCL 100 MG IV SOLR: 5.0000 mg/h | Freq: Once | INTRAVENOUS | Status: DC

## 2015-08-21 MED ORDER — WARFARIN - PHARMACIST DOSING INPATIENT: Freq: Every day | Status: DC

## 2015-08-21 MED ORDER — NITROGLYCERIN 0.4 MG SL SUBL
0.4000 mg | SUBLINGUAL_TABLET | Freq: Once | SUBLINGUAL | Status: AC
  Administered 2015-08-21: 0.4 mg via SUBLINGUAL

## 2015-08-21 MED ORDER — WARFARIN SODIUM 1 MG PO TABS
1.5000 mg | ORAL_TABLET | Freq: Once | ORAL | Status: AC
  Administered 2015-08-21: 1.5 mg via ORAL
  Filled 2015-08-21: qty 2
  Filled 2015-08-21: qty 1

## 2015-08-21 MED ORDER — ONDANSETRON HCL 4 MG/2ML IJ SOLN
4.0000 mg | Freq: Four times a day (QID) | INTRAMUSCULAR | Status: DC | PRN
  Administered 2015-08-22: 4 mg via INTRAVENOUS
  Filled 2015-08-21: qty 2

## 2015-08-21 MED ORDER — DILTIAZEM HCL 100 MG IV SOLR
5.0000 mg/h | INTRAVENOUS | Status: DC
  Administered 2015-08-21 – 2015-08-22 (×2): 10 mg/h via INTRAVENOUS
  Filled 2015-08-21 (×2): qty 100

## 2015-08-21 MED ORDER — ACETAMINOPHEN 650 MG RE SUPP: 650.0000 mg | Freq: Four times a day (QID) | RECTAL | Status: DC | PRN

## 2015-08-21 MED ORDER — ATENOLOL 25 MG PO TABS
50.0000 mg | ORAL_TABLET | Freq: Every day | ORAL | Status: DC
  Administered 2015-08-22: 50 mg via ORAL
  Filled 2015-08-21: qty 2

## 2015-08-21 NOTE — ED Notes (Signed)
Per report pt with CP and Afib for past 2 weeks, seen at [facility] and sent here, reported that HR 130-150's

## 2015-08-21 NOTE — Progress Notes (Signed)
Patient has been calling relentlessly on call light with numerous complaints. She states "I cant breathe", "My heart is beating too fast".  I have been in room multiple times for an assessment, to give her prn xanax, to turn up her nasal cannula O2. Respiratory has been in room not by one but by two different therapists. Multiple nurse techs have been in her room and another RN as well. She is stating, "no one has been in here" " you all arent doing anything for me" "I'll call my husband and get out of this hospital". The patient has been shown her monitor that clearly shows her O2 saturation as 96-99%. Respiratory rate of 20-27. Heart rate is 70s-80s and in sinus rhythm, which has actually converted from her chief complaint of atrial fibrillation. I have turned her O2 up to 4lpm [place]. Her lung sounds are clear. I have given xanax for anxiety. She is still adamant that the staff is neglecting her and have not done anything for her or paid her attention. I frankly asked the patient what would you have me to do and what would you like me to do differently and she stated " i want you to get out of this room". We will continue to monitor this patient but all staff involved have been nothing but helpful and willing to help her despite her resistance to our care.

## 2015-08-21 NOTE — ED Provider Notes (Signed)
CSN: [id number]     Arrival date & time 08/21/15  1444 History   First MD Initiated Contact with Patient 08/21/15 1502     Chief Complaint  Patient presents with  . Atrial Fibrillation     (Consider location/radiation/quality/duration/timing/severity/associated sxs/prior Treatment) Patient is a 74 y.o. female presenting with atrial fibrillation. The history is provided by the patient (Patient complains of chest pain and rapid heart rate. Patient states is been going on for 2 days).  Atrial Fibrillation This is a recurrent problem. The current episode started 2 days ago. The problem occurs constantly. The problem has not changed since onset.Associated symptoms include chest pain. Pertinent negatives include no abdominal pain and no headaches. Nothing aggravates the symptoms. Nothing relieves the symptoms.    Past Medical History  Diagnosis Date  . Essential hypertension   . Erosive esophagitis   . GERD (gastroesophageal reflux disease)   . Hypercholesterolemia   . Coronary atherosclerosis of native coronary artery     a. Nonobstructive minimal CAD 10/2005.  Marland Kitchen Paroxysmal atrial fibrillation (HCC)   . Diastolic dysfunction     Grade 1. Ejection fraction 60-65%.  . Osteoporosis   . PONV (postoperative nausea and vomiting)   . Heart murmur   . History of hiatal hernia   . Migraine     "used to have them right bad; I don't now" (04/11/2015)  . Vertigo   . Arthritis     "right leg" (04/11/2015)  . Anxiety   . Depression    Past Surgical History  Procedure Laterality Date  . Total abdominal hysterectomy    . Prolapsed uterine fibroid ligation  2015  . Cataract extraction w/ intraocular lens  implant, bilateral Bilateral   . Open reduction internal fixation (orif) tibia/fibula fracture Right 2013    broke tibia and fibula after falling down stairs  . Esophagogastroduodenoscopy (egd) with esophageal dilation  2001    Dr. [name]: erosive esophagitis, esophageal stricture, duodenitis,  s/p Savary dilation  . Colonoscopy  2008    Dr. [name]: internal hemorrhoids   . Esophagogastroduodenoscopy (egd) with esophageal dilation  11/15/2012    MEQ:[id number] web was found & MOST LIKELY CAUSE FOR DYAPHAGIA/Polyp was found in the gastric body and gastric fundus/ gastritis on bx  . Bravo ph study  11/15/2012    Procedure: BRAVO [place] STUDY;  Surgeon: [name], MD;  [facility];  Service: Endoscopy;;  . Flexible sigmoidoscopy N/A 08/22/2014    Procedure: FLEXIBLE SIGMOIDOSCOPY;  Surgeon: [name], MD;  [facility]: [facility];  Service: Endoscopy;  Laterality: N/A;  830  . Hemorrhoid banding N/A 08/22/2014    Procedure: HEMORRHOID BANDING;  Surgeon: [name], MD;  Location: [facility];  Service: Endoscopy;  Laterality: N/A;  . Fracture surgery    . Eye surgery Bilateral     "laser OR after cataract OR; cause I couldn't see"  . Dilation and curettage of uterus     Family History  Problem Relation Age of Onset  . Colon cancer Mother     Diagnosed at age 40  . Heart disease Mother   . Osteoporosis Mother   . Hip fracture Mother   . Stroke Sister   . Diabetes Sister   . Cancer - Other Sister   . Osteoporosis Sister   . Arthritis Sister   . Cancer Sister     uterine  . Stroke Sister   . Heart disease Father   . Hyperlipidemia Brother   .  Hypertension Brother   . Stroke Brother   . Diabetes Son    Social History  Substance Use Topics  . Smoking status: Never Smoker   . Smokeless tobacco: Never Used  . Alcohol Use: No   OB History    No data available     Review of Systems  Constitutional: Negative for appetite change and fatigue.  HENT: Negative for congestion, ear discharge and sinus pressure.   Eyes: Negative for discharge.  Respiratory: Negative for cough.   Cardiovascular: Positive for chest pain and palpitations.  Gastrointestinal: Negative for abdominal pain and diarrhea.  Genitourinary: Negative for frequency and hematuria.   Musculoskeletal: Negative for back pain.  Skin: Negative for rash.  Neurological: Negative for seizures and headaches.  Psychiatric/Behavioral: Negative for hallucinations.      Allergies  Crestor; Iodine; Iohexol; Morphine; Risedronate sodium; Triamterene-hctz; and Atorvastatin  Home Medications   Prior to Admission medications   Medication Sig Start Date End Date Taking? Authorizing Provider  ALPRAZolam (XANAX) 0.25 MG tablet TAKE 1 TABLET BY MOUTH TWICE A DAY AS NEEDED FOR ANXIETY, NERVES, OR SLEEP 06/02/15  Yes Sharion Balloon, FNP  amLODipine (NORVASC) 10 MG tablet Take 1 tablet (10 mg total) by mouth daily. 07/31/15  Yes Timmothy Euler, MD  atenolol (TENORMIN) 50 MG tablet Take 1 tablet (50 mg total) by mouth daily. 05/02/15  Yes Josue Hector, MD  calcium citrate-vitamin D (CITRACAL+D) 315-200 MG-UNIT per tablet Take 2 tablets by mouth every morning. 04/04/15  Yes Tammy Eckard, PHARMD  cholecalciferol (VITAMIN D) 1000 UNITS tablet Take 1,000 Units by mouth daily.   Yes Historical Provider, MD  Multiple Vitamin (MULTIVITAMIN) capsule Take 1 capsule by mouth every evening.   Yes Historical Provider, MD  omeprazole (PRILOSEC) 20 MG capsule Take 1 capsule (20 mg total) by mouth 2 (two) times daily. Patient taking differently: Take 20 mg by mouth daily.  11/27/14  Yes Sharion Balloon, FNP  simvastatin (ZOCOR) 40 MG tablet Take 1 tablet (40 mg total) by mouth every evening. 11/27/14  Yes Sharion Balloon, FNP  warfarin (COUMADIN) 3 MG tablet Take 1 tablet (3 mg total) by mouth daily. 07/31/15  Yes Timmothy Euler, MD  baclofen (LIORESAL) 10 MG tablet Take 0.5 tablets (5 mg total) by mouth 3 (three) times daily. Patient not taking: Reported on 08/21/2015 07/31/15   Timmothy Euler, MD   BP 118/91 mmHg  Pulse 38  Temp(Src) 97.9 F (36.6 C) (Oral)  Resp 23  Ht 5\' 1"  (1.549 m)  Wt 139 lb (63.05 kg)  BMI 26.28 kg/m2  SpO2 96% Physical Exam  Constitutional: She is oriented to  person, place, and time. She appears well-developed.  HENT:  Head: Normocephalic.  Eyes: Conjunctivae and EOM are normal. No scleral icterus.  Neck: Neck supple. No thyromegaly present.  Cardiovascular: Exam reveals no gallop and no friction rub.   No murmur heard. Rapid irregular heartbeat  Pulmonary/Chest: No stridor. She has no wheezes. She has no rales. She exhibits no tenderness.  Abdominal: She exhibits no distension. There is no tenderness. There is no rebound.  Musculoskeletal: Normal range of motion. She exhibits no edema.  Lymphadenopathy:    She has no cervical adenopathy.  Neurological: She is oriented to person, place, and time. She exhibits normal muscle tone. Coordination normal.  Skin: No rash noted. No erythema.  Psychiatric: She has a normal mood and affect. Her behavior is normal.    ED Course  Procedures (including critical  care time) Labs Review Labs Reviewed  PROTIME-INR - Abnormal; Notable for the following:    Prothrombin Time 20.0 (*)    INR 1.71 (*)    All other components within normal limits  CBC WITH DIFFERENTIAL/PLATELET - Abnormal; Notable for the following:    RDW 16.2 (*)    All other components within normal limits  BASIC METABOLIC PANEL - Abnormal; Notable for the following:    Potassium 3.4 (*)    Glucose, Bld 148 (*)    Calcium 8.4 (*)    GFR calc non Af Amer 53 (*)    All other components within normal limits  HEPATIC FUNCTION PANEL - Abnormal; Notable for the following:    Albumin 3.4 (*)    All other components within normal limits  TROPONIN I    Imaging Review Dg Chest Portable 1 View  08/21/2015  CLINICAL DATA:  Weakness.  Chest pain.  Shortness of breath. EXAM: PORTABLE CHEST 1 VIEW COMPARISON:  02/06/2015 FINDINGS: Low lung volumes are present, causing crowding of the pulmonary vasculature. Atherosclerotic calcification of the aortic arch. Mild enlargement of the cardiopericardial silhouette with prominence of upper zone  pulmonary vasculature. Linear opacity at the left lung base favoring subsegmental atelectasis or scar. No pleural effusion identified. IMPRESSION: 1. Mild enlargement of the cardiopericardial silhouette with cephalization of blood flow suggesting pulmonary venous hypertension. No overt edema. 2. Scarring at the left lung base. 3. Atherosclerotic aortic arch. 4. Low lung volumes are present, causing crowding of the pulmonary vasculature. Electronically Signed   By: Van Clines M.D.   On: 08/21/2015 15:41   I have personally reviewed and evaluated these images and lab results as part of my medical decision-making.   EKG Interpretation   Date/Time:  Thursday August 21 2015 14:56:10 EDT Ventricular Rate:  152 PR Interval:    QRS Duration: 73 QT Interval:  311 QTC Calculation: 495 R Axis:   8 Text Interpretation:  Atrial fibrillation with rapid V-rate Ventricular  premature complex Borderline low voltage, extremity leads Repolarization  abnormality, prob rate related Confirmed by Imanii Gosdin  MD, Connie Lasater [phone]) on  08/21/2015 4:37:04 PM     CRITICAL CARE Performed by: Aren Cherne L Total critical care time: 35 Critical care time was exclusive of separately billable procedures and treating other patients. Critical care was necessary to treat or prevent imminent or life-threatening deterioration. Critical care was time spent personally by me on the following activities: development of treatment plan with patient and/or surrogate as well as nursing, discussions with consultants, evaluation of patient's response to treatment, examination of patient, obtaining history from patient or surrogate, ordering and performing treatments and interventions, ordering and review of laboratory studies, ordering and review of radiographic studies, pulse oximetry and re-evaluation of patient's condition.   MDM   Final diagnoses:  Atrial fibrillation with RVR ([place])    Admit to stepdown for rapid A.  fib    Milton Ferguson, MD 08/21/15 [phone]

## 2015-08-21 NOTE — Progress Notes (Addendum)
   HPI  Patient presents today here for follow-up with complaints of racing heart and chest pain.  Chest pain, racing heart She states that it's been going on for now about 1 weeks after starting prednisone for back pain. The prednisone did not help the back pain.  Tylenol did She describes heaviness type non radiating chest pain continuously that's worse with exertion. She is also having exertional dyspnea.  She denies orthopnea or new leg swelling. She's taking all her medications like usual.  She is breathing easily now as long as she is relaxed. She states she took 1 full dose aspirin this morning,   her chest is hurting worse than it was earlier today.   PMH: Smoking status noted ROS: Per HPI  Objective: BP 125/95 mmHg  Pulse 135  Temp(Src) 97.2 F (36.2 C) (Oral)  Ht 5\' 1"  (1.549 m)  Wt 139 lb 6.4 oz (63.231 kg)  BMI 26.35 kg/m2 Gen: NAD, alert, cooperative with exam HEENT: NCAT CV: tachy, no murmur heard Resp: non labored, crackles at the bases that softened with several deep inspirations Ext: 1 + pitting edema Neuro: Alert and oriented, No gross deficits  EKG shows Afib with RVR  Assessment and plan:  # Chest pain, A. fib with RVR Patient now with chest pain at rest, given nitroglycerin and oxygen She states she took a full aspirin this morning 2016, TTE with normal EF, LVH Normal stress myoview in June Transfer to hospital via EMS She ahs also taken her atenolol today.    Laroy Apple, MD [facility] 08/21/2015, 1:31 PM

## 2015-08-21 NOTE — H&P (Signed)
[facility] History and Physical  [name] KDX:[id number] DOB: 06-Oct-1941 DOA: 08/21/2015  Referring physician: Dr. Roderic Palau - [facility] PCP: Evelina Dun, FNP   Chief Complaint: CP  HPI: Norma Barajas is a 74 y.o. female  Patient seen earlier today by her primary care physician Dr. Laroy Apple. At the time she was complaining of chest pain and racing heart. Symptoms started approximately 1 week ago after starting prednisone for back pain. Stopped prednisone after onset of symptoms w/o any relief. Back pain is not relieved with the steroids but was relieved by Tylenol. Chest pain is described as a heavy sensation that is midsternal and nonradiating. This is worsened with ambulation. Denies LOC.   Review of Systems:  Constitutional:  No weight loss, night sweats, Fevers, chills HEENT:  No headaches, Difficulty swallowing,Tooth/dental problems,Sore throat, Cardio-vascular: Per HPI GI:  No heartburn, indigestion, abdominal pain, nausea, vomiting, diarrhea, change in bowel habits, loss of appetite  Resp:   No productive cough, No non-productive cough, No coughing up of blood.No change in color of mucus.No wheezing.No chest wall deformity  Skin:  no rash or lesions.  GU:  no dysuria, change in color of urine, no urgency or frequency. No flank pain.  Musculoskeletal:   Per HPi Psych:  No change in mood or affect. No depression or anxiety. No memory loss.  Neuro:  No change in sensation, unilateral strength, or cognitive abilities  All other systems were reviewed and are negative.  Past Medical History  Diagnosis Date  . Essential hypertension   . Erosive esophagitis   . GERD (gastroesophageal reflux disease)   . Hypercholesterolemia   . Coronary atherosclerosis of native coronary artery     a. Nonobstructive minimal CAD 10/2005.  Marland Kitchen Paroxysmal atrial fibrillation (HCC)   . Diastolic dysfunction     Grade 1. Ejection fraction 60-65%.  . Osteoporosis   . PONV (postoperative  nausea and vomiting)   . Heart murmur   . History of hiatal hernia   . Migraine     "used to have them right bad; I don't now" (04/11/2015)  . Vertigo   . Arthritis     "right leg" (04/11/2015)  . Anxiety   . Depression    Past Surgical History  Procedure Laterality Date  . Total abdominal hysterectomy    . Prolapsed uterine fibroid ligation  2015  . Cataract extraction w/ intraocular lens  implant, bilateral Bilateral   . Open reduction internal fixation (orif) tibia/fibula fracture Right 2013    broke tibia and fibula after falling down stairs  . Esophagogastroduodenoscopy (egd) with esophageal dilation  2001    Dr. [name]: erosive esophagitis, esophageal stricture, duodenitis, s/p Savary dilation  . Colonoscopy  2008    Dr. [name]: internal hemorrhoids   . Esophagogastroduodenoscopy (egd) with esophageal dilation  11/15/2012    ZJQ:[id number] web was found & MOST LIKELY CAUSE FOR DYAPHAGIA/Polyp was found in the gastric body and gastric fundus/ gastritis on bx  . Bravo ph study  11/15/2012    Procedure: BRAVO [place] STUDY;  Surgeon: [name], MD;  [facility];  Service: Endoscopy;;  . Flexible sigmoidoscopy N/A 08/22/2014    Procedure: FLEXIBLE SIGMOIDOSCOPY;  Surgeon: [name], MD;  [facility];  Service: Endoscopy;  Laterality: N/A;  830  . Hemorrhoid banding N/A 08/22/2014    Procedure: HEMORRHOID BANDING;  Surgeon: [name], MD;  [facility];  Service: Endoscopy;  Laterality: N/A;  . Fracture surgery    .  Eye surgery Bilateral     "laser OR after cataract OR; cause I couldn't see"  . Dilation and curettage of uterus     Social History:  reports that she has never smoked. She has never used smokeless tobacco. She reports that she does not drink alcohol or use illicit drugs.  Allergies  Allergen Reactions  . Crestor [Rosuvastatin Calcium] Other (See Comments)    weakness  . Iodine     ivp dye  . Iohexol   . Morphine Nausea  Only  . Risedronate Sodium Other (See Comments)    ACTONEL REACTION: reflux  . Triamterene-Hctz Other (See Comments)    weakness  . Atorvastatin Other (See Comments)    Myalgias     Family History  Problem Relation Age of Onset  . Colon cancer Mother     Diagnosed at age 60  . Heart disease Mother   . Osteoporosis Mother   . Hip fracture Mother   . Stroke Sister   . Diabetes Sister   . Cancer - Other Sister   . Osteoporosis Sister   . Arthritis Sister   . Cancer Sister     uterine  . Stroke Sister   . Heart disease Father   . Hyperlipidemia Brother   . Hypertension Brother   . Stroke Brother   . Diabetes Son      Prior to Admission medications   Medication Sig Start Date End Date Taking? Authorizing Provider  ALPRAZolam (XANAX) 0.25 MG tablet TAKE 1 TABLET BY MOUTH TWICE A DAY AS NEEDED FOR ANXIETY, NERVES, OR SLEEP 06/02/15  Yes Sharion Balloon, FNP  amLODipine (NORVASC) 10 MG tablet Take 1 tablet (10 mg total) by mouth daily. 07/31/15  Yes Timmothy Euler, MD  atenolol (TENORMIN) 50 MG tablet Take 1 tablet (50 mg total) by mouth daily. 05/02/15  Yes Josue Hector, MD  calcium citrate-vitamin D (CITRACAL+D) 315-200 MG-UNIT per tablet Take 2 tablets by mouth every morning. 04/04/15  Yes Tammy Eckard, PHARMD  cholecalciferol (VITAMIN D) 1000 UNITS tablet Take 1,000 Units by mouth daily.   Yes Historical Provider, MD  Multiple Vitamin (MULTIVITAMIN) capsule Take 1 capsule by mouth every evening.   Yes Historical Provider, MD  omeprazole (PRILOSEC) 20 MG capsule Take 1 capsule (20 mg total) by mouth 2 (two) times daily. Patient taking differently: Take 20 mg by mouth daily.  11/27/14  Yes Sharion Balloon, FNP  simvastatin (ZOCOR) 40 MG tablet Take 1 tablet (40 mg total) by mouth every evening. 11/27/14  Yes Sharion Balloon, FNP  warfarin (COUMADIN) 3 MG tablet Take 1 tablet (3 mg total) by mouth daily. 07/31/15  Yes Timmothy Euler, MD  baclofen (LIORESAL) 10 MG tablet Take 0.5  tablets (5 mg total) by mouth 3 (three) times daily. Patient not taking: Reported on 08/21/2015 07/31/15   Timmothy Euler, MD   Physical Exam: Filed Vitals:   08/21/15 1651 08/21/15 1652 08/21/15 1700 08/21/15 1701  BP:   105/77   Pulse: 41 41 31 127  Temp:      TempSrc:      Resp: 24 22 20 24   Height:      Weight:      SpO2: 97% 97% 96% 97%    Wt Readings from Last 3 Encounters:  08/21/15 63.05 kg (139 lb)  08/21/15 63.231 kg (139 lb 6.4 oz)  08/07/15 61.326 kg (135 lb 3.2 oz)    General: Appears to be in mild distress Eyes:  PERRL, EOMI, normal lids, iris ENT:  grossly normal hearing, lips & tongue Neck:  no LAD, masses or thyromegaly Cardiovascular:  Irregularly irregular, no m/r. No LE edema.  Respiratory:  CTA bilaterally, no w/r/r. Normal respiratory effort. Abdomen:  soft, ntnd Skin:  no rash or induration seen on limited exam Musculoskeletal:  grossly normal tone BUE/BLE Psychiatric:  grossly normal mood and affect, speech fluent and appropriate Neurologic:  CN 2-12 grossly intact, moves all extremities in coordinated fashion.          Labs on Admission:  Basic Metabolic Panel:  Recent Labs Lab 08/21/15 1508  NA 138  K 3.4*  CL 105  CO2 25  GLUCOSE 148*  BUN 12  CREATININE 1.00  CALCIUM 8.4*   Liver Function Tests:  Recent Labs Lab 08/21/15 1508  AST 34  ALT 31  ALKPHOS 80  BILITOT 0.6  PROT 6.6  ALBUMIN 3.4*   No results for input(s): LIPASE, AMYLASE in the last 168 hours. No results for input(s): AMMONIA in the last 168 hours. CBC:  Recent Labs Lab 08/21/15 1508  WBC 9.2  NEUTROABS 6.3  HGB 12.6  HCT 39.2  MCV 88.5  PLT 188   Cardiac Enzymes:  Recent Labs Lab 08/21/15 1508  TROPONINI <0.03    BNP (last 3 results) No results for input(s): BNP in the last 8760 hours.  ProBNP (last 3 results) No results for input(s): PROBNP in the last 8760 hours.  CBG: No results for input(s): GLUCAP in the last 168  hours.  Radiological Exams on Admission: Dg Chest Portable 1 View  08/21/2015  CLINICAL DATA:  Weakness.  Chest pain.  Shortness of breath. EXAM: PORTABLE CHEST 1 VIEW COMPARISON:  02/06/2015 FINDINGS: Low lung volumes are present, causing crowding of the pulmonary vasculature. Atherosclerotic calcification of the aortic arch. Mild enlargement of the cardiopericardial silhouette with prominence of upper zone pulmonary vasculature. Linear opacity at the left lung base favoring subsegmental atelectasis or scar. No pleural effusion identified. IMPRESSION: 1. Mild enlargement of the cardiopericardial silhouette with cephalization of blood flow suggesting pulmonary venous hypertension. No overt edema. 2. Scarring at the left lung base. 3. Atherosclerotic aortic arch. 4. Low lung volumes are present, causing crowding of the pulmonary vasculature. Electronically Signed   By: Van Clines M.D.   On: 08/21/2015 15:41     Assessment/Plan Principal Problem:   Atrial fibrillation with rapid ventricular response (HCC) Active Problems:   Hyperlipidemia   Essential hypertension   GERD   PAF (paroxysmal atrial fibrillation) (HCC)   Anxiety   Afib w/ RVR: Currently patient is on diltiazem drip in the ED with rate approximately 110. Patient states this is happened 2 or 3 times in the past. Known history of paroxysmal atrial fibrillation. Typically when these events occur she is states that she rests and takes Xanax with resolution. Cramps so she feels like came on after taking prednisone for back pain. Troponin negative. INR 1.7. Cardiologist is Dr. [name] - Stepdown - Increase Dilt drip to 15 - Continue norvasc, Atenolol in am - cards outpt f/u  - continue warfarin  GERD: - continue ppi  HLD: - continue statin  ANxiety: - continue xanax  HypoK: - Kdur 54mEq x1 - Mag   Code Status: FULL  DVT Prophylaxis: Warfarin Family Communication: Husband Disposition Plan:  Pending  Improvement    MERRELL, DAVID J, MD Family Medicine [facility] [email or web address] Password [email or web address]

## 2015-08-21 NOTE — Addendum Note (Signed)
Addended by: Nigel Berthold C on: 08/21/2015 02:03 PM   Modules accepted: Orders

## 2015-08-21 NOTE — Progress Notes (Signed)
ANTICOAGULATION CONSULT NOTE - Initial Consult  Pharmacy Consult for Coumadin Indication: atrial fibrillation  Allergies  Allergen Reactions  . Crestor [Rosuvastatin Calcium] Other (See Comments)    weakness  . Iodine     ivp dye  . Iohexol   . Morphine Nausea Only  . Risedronate Sodium Other (See Comments)    ACTONEL REACTION: reflux  . Triamterene-Hctz Other (See Comments)    weakness  . Atorvastatin Other (See Comments)    Myalgias     Patient Measurements: Height: 5\' 1"  (154.9 cm) Weight: 139 lb (63.05 kg) IBW/kg (Calculated) : 47.8 Heparin Dosing Weight:   Vital Signs: Temp: 97.9 F (36.6 C) (10/13 1457) Temp Source: Oral (10/13 1457) BP: 105/77 mmHg (10/13 1700) Pulse Rate: 127 (10/13 1701)  Labs:  Recent Labs  08/20/15 1100 08/21/15 1508  HGB  --  12.6  HCT  --  39.2  PLT  --  188  LABPROT  --  20.0*  INR 3.2 1.71*  CREATININE  --  1.00  TROPONINI  --  <0.03    Estimated Creatinine Clearance: 40.7 mL/min (by C-G formula based on Cr of 1).   Medical History: Past Medical History  Diagnosis Date  . Essential hypertension   . Erosive esophagitis   . GERD (gastroesophageal reflux disease)   . Hypercholesterolemia   . Coronary atherosclerosis of native coronary artery     a. Nonobstructive minimal CAD 10/2005.  Marland Kitchen Paroxysmal atrial fibrillation (HCC)   . Diastolic dysfunction     Grade 1. Ejection fraction 60-65%.  . Osteoporosis   . PONV (postoperative nausea and vomiting)   . Heart murmur   . History of hiatal hernia   . Migraine     "used to have them right bad; I don't now" (04/11/2015)  . Vertigo   . Arthritis     "right leg" (04/11/2015)  . Anxiety   . Depression     Medications:   (Not in a hospital admission)  Assessment: Anticoagulation Dose Instructions as of 08/20/2015       Total Sun Mon Tue Wed Thu Fri Sat    New Dose 15 mg 1.5 mg 3 mg 1.5 mg 3 mg 1.5 mg 3 mg 1.5 mg      (3 mg x 0.5)  (3 mg x 1)  (3 mg x 0.5)  (3 mg x 1)  (3 mg x 0.5)  (3 mg x 1)  (3 mg x 0.5)     Goal of Therapy:  INR 2-3 Monitor platelets by anticoagulation protocol: Yes   Plan:  Coumadin 1.5 mg po x 1 dose tonight INR/PT daily  Abner Greenspan, Skyah Hannon Bennett 08/21/2015,5:24 PM

## 2015-08-22 ENCOUNTER — Inpatient Hospital Stay (HOSPITAL_COMMUNITY): Payer: Commercial Managed Care - HMO

## 2015-08-22 DIAGNOSIS — J810 Acute pulmonary edema: Secondary | ICD-10-CM

## 2015-08-22 DIAGNOSIS — I4891 Unspecified atrial fibrillation: Secondary | ICD-10-CM

## 2015-08-22 DIAGNOSIS — J90 Pleural effusion, not elsewhere classified: Secondary | ICD-10-CM

## 2015-08-22 DIAGNOSIS — I480 Paroxysmal atrial fibrillation: Principal | ICD-10-CM

## 2015-08-22 LAB — BASIC METABOLIC PANEL
ANION GAP: 7 (ref 5–15)
BUN: 8 mg/dL (ref 6–20)
CHLORIDE: 109 mmol/L (ref 101–111)
CO2: 25 mmol/L (ref 22–32)
Calcium: 7.6 mg/dL — ABNORMAL LOW (ref 8.9–10.3)
Creatinine, Ser: 0.79 mg/dL (ref 0.44–1.00)
GFR calc Af Amer: >60 mL/min (ref >60)
GLUCOSE: 115 mg/dL — AB (ref 65–99)
POTASSIUM: 3.6 mmol/L (ref 3.5–5.1)
Sodium: 141 mmol/L (ref 135–145)

## 2015-08-22 LAB — CBC
HCT: 38.3 % (ref 36.0–46.0)
HEMOGLOBIN: 12.5 g/dL (ref 12.0–15.0)
MCH: 28.9 pg (ref 26.0–34.0)
MCHC: 32.6 g/dL (ref 30.0–36.0)
MCV: 88.5 fL (ref 78.0–100.0)
PLATELETS: 189 10*3/uL (ref 150–400)
RBC: 4.33 MIL/uL (ref 3.87–5.11)
RDW: 16.3 % — ABNORMAL HIGH (ref 11.5–15.5)
WBC: 13.3 10*3/uL — AB (ref 4.0–10.5)

## 2015-08-22 LAB — PROTIME-INR
INR: 1.77 — ABNORMAL HIGH (ref 0.00–1.49)
PROTHROMBIN TIME: 20.6 s — AB (ref 11.6–15.2)

## 2015-08-22 LAB — TROPONIN I

## 2015-08-22 MED ORDER — OMEPRAZOLE 20 MG PO CPDR: 20.0000 mg | DELAYED_RELEASE_CAPSULE | Freq: Every day | ORAL | Status: DC

## 2015-08-22 MED ORDER — LORAZEPAM 2 MG/ML IJ SOLN: 1.0000 mg | Freq: Once | INTRAMUSCULAR | Status: DC

## 2015-08-22 MED ORDER — WARFARIN SODIUM 2 MG PO TABS: 3.0000 mg | ORAL_TABLET | Freq: Once | ORAL | Status: DC

## 2015-08-22 MED ORDER — FUROSEMIDE 10 MG/ML IJ SOLN
20.0000 mg | Freq: Once | INTRAMUSCULAR | Status: AC
  Administered 2015-08-22: 20 mg via INTRAVENOUS
  Filled 2015-08-22: qty 2

## 2015-08-22 NOTE — Progress Notes (Signed)
Dr. Marin Comment called back and informed that patient refused the Ativan that was previously ordered; order received for Fentanyl; patient refused; patient stated that everything was getting better and she did not want anything at this time; she was just going to try and rest; Dr Marin Comment paged about her refusal of medications again

## 2015-08-22 NOTE — Progress Notes (Signed)
Patient returned from radiology via wheelchair. Attempt to ambulate patient patient refused at this time c/o nausea. Vomited small amount of emesis while in radiology. Medicated with zofran.

## 2015-08-22 NOTE — Progress Notes (Signed)
[place] for Coumadin Indication: atrial fibrillation  Allergies  Allergen Reactions  . Crestor [Rosuvastatin Calcium] Other (See Comments)    weakness  . Iodine     ivp dye  . Iohexol   . Morphine Nausea Only  . Risedronate Sodium Other (See Comments)    ACTONEL REACTION: reflux  . Triamterene-Hctz Other (See Comments)    weakness  . Atorvastatin Other (See Comments)    Myalgias     Patient Measurements: Height: 5\' 1"  (154.9 cm) Weight: 141 lb 12.1 oz (64.3 kg) IBW/kg (Calculated) : 47.8 Heparin Dosing Weight:   Vital Signs: Temp: 98.3 F (36.8 C) (10/14 0800) Temp Source: Oral (10/14 0800) BP: 106/61 mmHg (10/14 1000) Pulse Rate: 68 (10/14 1000)  Labs:  Recent Labs  08/20/15 1100 08/21/15 1508 08/22/15 0522 08/22/15 0817  HGB  --  12.6 12.5  --   HCT  --  39.2 38.3  --   PLT  --  188 189  --   LABPROT  --  20.0* 20.6*  --   INR 3.2 1.71* 1.77*  --   CREATININE  --  1.00 0.79  --   TROPONINI  --  <0.03  --  <0.03    Estimated Creatinine Clearance: 51.4 mL/min (by C-G formula based on Cr of 0.79).   Medical History: Past Medical History  Diagnosis Date  . Essential hypertension   . Erosive esophagitis   . GERD (gastroesophageal reflux disease)   . Hypercholesterolemia   . Coronary atherosclerosis of native coronary artery     a. Nonobstructive minimal CAD 10/2005.  [place] Kitchen Paroxysmal atrial fibrillation (HCC)   . Diastolic dysfunction     Grade 1. Ejection fraction 60-65%.  . Osteoporosis   . PONV (postoperative nausea and vomiting)   . Heart murmur   . History of hiatal hernia   . Migraine     "used to have them right bad; I don't now" (04/11/2015)  . Vertigo   . Arthritis     "right leg" (04/11/2015)  . Anxiety   . Depression     Medications:  Prescriptions prior to admission  Medication Sig Dispense Refill Last Dose  . ALPRAZolam (XANAX) 0.25 MG tablet TAKE 1 TABLET BY MOUTH TWICE A DAY AS NEEDED FOR  ANXIETY, NERVES, OR SLEEP 60 tablet 3 08/21/2015 at Unknown time  . amLODipine (NORVASC) 10 MG tablet Take 1 tablet (10 mg total) by mouth daily. 90 tablet 3 08/21/2015 at Unknown time  . atenolol (TENORMIN) 50 MG tablet Take 1 tablet (50 mg total) by mouth daily. 90 tablet 3 08/21/2015 at 800  . calcium citrate-vitamin D (CITRACAL+D) 315-200 MG-UNIT per tablet Take 2 tablets by mouth every morning.   08/21/2015 at Unknown time  . cholecalciferol (VITAMIN D) 1000 UNITS tablet Take 1,000 Units by mouth daily.   Past Week at Unknown time  . Multiple Vitamin (MULTIVITAMIN) capsule Take 1 capsule by mouth every evening.   Past Week at Unknown time  . omeprazole (PRILOSEC) 20 MG capsule Take 1 capsule (20 mg total) by mouth 2 (two) times daily. (Patient taking differently: Take 20 mg by mouth daily. ) 180 capsule 4 08/21/2015 at Unknown time  . simvastatin (ZOCOR) 40 MG tablet Take 1 tablet (40 mg total) by mouth every evening. 90 tablet 4 08/20/2015 at Unknown time  . warfarin (COUMADIN) 3 MG tablet Take 1 tablet (3 mg total) by mouth daily. 30 tablet 0 Past Week at Unknown time  .  baclofen (LIORESAL) 10 MG tablet Take 0.5 tablets (5 mg total) by mouth 3 (three) times daily. (Patient not taking: Reported on 08/21/2015) 30 each 0 Taking    Assessment:  Patient was instructed not to take coumadin on 10/12.  INR today is subtherapeutic. Anticoagulation Dose Instructions as of 08/20/2015       Total Sun Mon Tue Wed Thu Fri Sat    New Dose 15 mg 1.5 mg 3 mg 1.5 mg 3 mg 1.5 mg 3 mg 1.5 mg      (3 mg x 0.5)  (3 mg x 1)  (3 mg x 0.5)  (3 mg x 1)  (3 mg x 0.5)  (3 mg x 1)  (3 mg x 0.5)     Goal of Therapy:  INR 2-3 Monitor platelets by anticoagulation protocol: Yes   Plan:  Coumadin 3 mg po x 1 dose today INR/PT daily  Norma Barajas 08/22/2015,11:43 AM

## 2015-08-22 NOTE — Discharge Summary (Signed)
Physician Discharge Summary  Norma Barajas RCV:[id number] DOB: 02/16/1941 DOA: 08/21/2015  PCP: Evelina Dun, FNP  Admit date: 08/21/2015 Discharge date: 08/22/2015  Recommendations for Outpatient Follow-up:   Follow up atrial fibrillation, small left pleural effusion.    Follow-up Information    Follow up with Evelina Dun, FNP. Schedule an appointment as soon as possible for a visit in 1 week.   Specialty:  Family Medicine   Contact information:   [place] [address] [phone]        Discharge Diagnoses:   Atrial fibrillation with RVR.   Mild left pleural effusion  Acute pulmonary edema secondary to rapid heart rate  CAD.  GERD.   hyperlipidemia  Anxiety.   Hypokalemia.   Discharge Condition: Improved Disposition: Discharge home  Diet recommendation: Heart healthy   Filed Weights   08/21/15 1452 08/22/15 0530  Weight: 63.05 kg (139 lb) 64.3 kg (141 lb 12.1 oz)    History of present illness:  8 yow PMH PAF presented to the ED with chest pain and racing heart. While in the ED patient was found to have heart rate of 110 with negative troponin and INR of 1.7. Her CXR revealed evidence of pulmonary venous hypertension, atherosclerotic aortic arch and low lung volumes. Patient was admitted to the hospital for further management of atrial fibrillation with RVR.  Hospital Course:  Atrial fibrillation with RVR treated with IV diltiazem with subsequent conversion to SR. No recurrent chest pain and troponins negative, ruled out for ACS. Successfully transitioned to oral atenolol. 2 view CXR with mild pulmonary edema and left pleural effusion, treated with Lasix. No hypoxia, ambulated on RA. Condition improved. Plan discharge home with outpatient follow-up. Pulmonary edema secondary to rapid rate. No history of CHF. LVEF 65% with normal wall motion on echo 04/11/2015. Reviewed CXR with husband at bedside.  1. Atrial fibrillation with RVR.  Converted back to SR. Troponins negative, has ruled out for ACS. Normal LVEF by echo 04/11/2015. TSH WNL 04/2015. Per Dr. Kyla Balzarine last office note will continue atenolol (has not tolerated oral Cardizem). 2. Mild left pleural effusion, likely secondary to rapid heart rate. Expect spontaneous resolution. 3. Acute pulmonary edema, secondary to rapid heart rate. 4. CAD. Per Dr. Kyla Balzarine note: Cardiac catheterization in 2006 showed non obstructive CAD. Had normal Lexiscan in 04/2015. 5. GERD. Continue PPI. 6. HLD. Continue statin. 7. Anxiety. Continue Xanax. 8. Hypokalemia. Repleted  Consultants:  None   Procedures  None  Discharge Instructions Discharge Instructions    Diet - low sodium heart healthy    Complete by:  As directed      Discharge instructions    Complete by:  As directed   Call your physician or seek immediate medical attention for pain, shortness of breath, rapid heart rate, dizziness, fever or worsening of condition.     Increase activity slowly    Complete by:  As directed             Current Discharge Medication List    CONTINUE these medications which have CHANGED   Details  omeprazole (PRILOSEC) 20 MG capsule Take 1 capsule (20 mg total) by mouth daily.   Associated Diagnoses: Gastroesophageal reflux disease, esophagitis presence not specified      CONTINUE these medications which have NOT CHANGED   Details  ALPRAZolam (XANAX) 0.25 MG tablet TAKE 1 TABLET BY MOUTH TWICE A DAY AS NEEDED FOR ANXIETY, NERVES, OR SLEEP Qty: 60 tablet, Refills: 3   Associated Diagnoses:  GAD (generalized anxiety disorder)    amLODipine (NORVASC) 10 MG tablet Take 1 tablet (10 mg total) by mouth daily. Qty: 90 tablet, Refills: 3    atenolol (TENORMIN) 50 MG tablet Take 1 tablet (50 mg total) by mouth daily. Qty: 90 tablet, Refills: 3   Associated Diagnoses: Essential hypertension    calcium citrate-vitamin D (CITRACAL+D) 315-200 MG-UNIT per tablet Take 2 tablets by mouth  every morning.    cholecalciferol (VITAMIN D) 1000 UNITS tablet Take 1,000 Units by mouth daily.    Multiple Vitamin (MULTIVITAMIN) capsule Take 1 capsule by mouth every evening.    simvastatin (ZOCOR) 40 MG tablet Take 1 tablet (40 mg total) by mouth every evening. Qty: 90 tablet, Refills: 4   Associated Diagnoses: Hyperlipidemia    warfarin (COUMADIN) 3 MG tablet Take 1 tablet (3 mg total) by mouth daily. Qty: 30 tablet, Refills: 0      STOP taking these medications     baclofen (LIORESAL) 10 MG tablet        Allergies  Allergen Reactions  . Crestor [Rosuvastatin Calcium] Other (See Comments)    weakness  . Iodine     ivp dye  . Iohexol   . Morphine Nausea Only  . Risedronate Sodium Other (See Comments)    ACTONEL REACTION: reflux  . Triamterene-Hctz Other (See Comments)    weakness  . Atorvastatin Other (See Comments)    Myalgias     The results of significant diagnostics from this hospitalization (including imaging, microbiology, ancillary and laboratory) are listed below for reference.    Significant Diagnostic Studies: Dg Chest 2 View  08/22/2015  CLINICAL DATA:  Cough for 1 week. EXAM: CHEST  2 VIEW COMPARISON:  August 21, 2015. FINDINGS: Stable cardiomediastinal silhouette. No pneumothorax is noted. Increased mild left pleural effusion is noted. Increased interstitial densities are noted throughout both lungs concerning for pulmonary edema. Bony thorax is unremarkable. IMPRESSION: Increased interstitial densities throughout both lungs concerning for pulmonary edema. Increase mild left pleural effusion. Electronically Signed   By: Marijo Conception, M.D.   On: 08/22/2015 12:08   Dg Chest Portable 1 View  08/21/2015  CLINICAL DATA:  Weakness.  Chest pain.  Shortness of breath. EXAM: PORTABLE CHEST 1 VIEW COMPARISON:  02/06/2015 FINDINGS: Low lung volumes are present, causing crowding of the pulmonary vasculature. Atherosclerotic calcification of the aortic arch.  Mild enlargement of the cardiopericardial silhouette with prominence of upper zone pulmonary vasculature. Linear opacity at the left lung base favoring subsegmental atelectasis or scar. No pleural effusion identified. IMPRESSION: 1. Mild enlargement of the cardiopericardial silhouette with cephalization of blood flow suggesting pulmonary venous hypertension. No overt edema. 2. Scarring at the left lung base. 3. Atherosclerotic aortic arch. 4. Low lung volumes are present, causing crowding of the pulmonary vasculature. Electronically Signed   By: Van Clines M.D.   On: 08/21/2015 15:41   Labs: Basic Metabolic Panel:  Recent Labs Lab 08/21/15 1508 08/22/15 0522  NA 138 141  K 3.4* 3.6  CL 105 109  CO2 25 25  GLUCOSE 148* 115*  BUN 12 8  CREATININE 1.00 0.79  CALCIUM 8.4* 7.6*   Liver Function Tests:  Recent Labs Lab 08/21/15 1508  AST 34  ALT 31  ALKPHOS 80  BILITOT 0.6  PROT 6.6  ALBUMIN 3.4*  CBC:  Recent Labs Lab 08/21/15 1508 08/22/15 0522  WBC 9.2 13.3*  NEUTROABS 6.3  --   HGB 12.6 12.5  HCT 39.2 38.3  MCV 88.5 88.5  PLT 188 189   Cardiac Enzymes:  Recent Labs Lab 08/21/15 1508 08/22/15 0817  TROPONINI <0.03 <0.03   Principal Problem:   Atrial fibrillation with rapid ventricular response (HCC) Active Problems:   Hyperlipidemia   Essential hypertension   GERD   PAF (paroxysmal atrial fibrillation) ([place])   Anxiety   Time coordinating discharge: 35 minutes   Signed:  Murray Hodgkins, MD [facility] 08/22/2015, 10:20 AM   By signing my name below, I, Rhett Bannister attest that this documentation has been prepared under the direction and in the presence of Murray Hodgkins, MD   Electronically signed: Rhett Bannister  08/22/2015   I personally performed the services described in this documentation. All medical record entries made by the scribe were at my direction. I have reviewed the chart and agree that the record reflects my  personal performance and is accurate and complete. Murray Hodgkins, MD

## 2015-08-22 NOTE — Progress Notes (Signed)
SATURATION QUALIFICATIONS: (This note is used to comply with regulatory documentation for home oxygen)  Patient Saturations on Room Air at Rest = 92%  Patient Saturations on Room Air while Ambulating =90%  Patient Saturations on Liters of oxygen while Ambulating = %  Please briefly explain why patient needs home oxygen: 

## 2015-08-22 NOTE — Progress Notes (Signed)
Patient has been calling out every few minutes as soon as one staff member walks out of the room she is callin gout again; she has vague complaints for HA, face pain, anxiety, and vertigo. Patient states she takes her xanax at home when she needs it, PRN dose has already been given once this evening with 2 doses of her ordered pain medication; Dr Marin Comment called and asked to come and speak with the patient to see if there was anything else that we can do to make her stay better; order received for 1 time dose of Ativan 1mg  IV now.

## 2015-08-22 NOTE — Progress Notes (Signed)
PROGRESS NOTE  Norma Barajas IZT:[id number] DOB: 01-31-1941 DOA: 08/21/2015 PCP: Evelina Dun, FNP Cardiologist is Dr. [name]  Summary: 74 yo female with history of essential HTN, GERD, HLD, diastolic dysfunction, heart murmur, coronary atherosclerosis of native coronary artery and paroxysmal atrial fibrillation who presented to the ED with chest pain and racing heart. While in the ED patient was found to have heart rate of 110 but labs revealed negative troponin and INR of 1.7. Her CXR revealed evidence of pulmonary venous hypertension, atherosclerotic aortic arch and low lung volumes. Patient was admitted to the hospital for further management.  Assessment/Plan: 1. Atrial fibrillation with RVR. Converted back to SR. Troponins negative, has ruled out for ACS.  Normal LVEF by echo 04/11/2015. TSH WNL 04/2015. Per Dr. Kyla Balzarine last office note will continue atenolol (has not tolerated oral Cardizem). 2. CAD. Per Dr. Kyla Balzarine note: Cardiac catheterization in 2006 showed non obstructive CAD. Had normal Lexiscan in 04/2015. 3. GERD. Continue PPI. 4. HLD. Continue statin. 5. Anxiety. Continue Xanax. 6. Hypokalemia. Repleted   Overall improved.   Stop diltiazem infusion. Resume atenolol.  Wean oxygen as tolerated.   Repeat CXR and ambulate.  Anticipated discharge later today.   Code Status: Full DVT prophylaxis: Warfarin Family discussion: Husband bedside. Discussed plan in detail. No further concerns at this time. Disposition Plan: Anticipate discharge later today.   Norma Hodgkins, MD  [facility]  Pager [phone] If 7PM-7AM, please contact night-coverage at [email or web address], password [facility] 08/22/2015, 10:18 AM  LOS: 1 day   Consultants:    Procedures:    Antibiotics:    HPI/Subjective: RN note reviewed, patient with various complaints and dissatisfied, refusing medications.  Feels good. Was unable to eat due to mild nausea. Breathing is doing okay. Denies chest pain.  Complains of "rattling" in her chest and DOE.   Objective: Filed Vitals:   08/22/15 0700 08/22/15 0800 08/22/15 0900 08/22/15 0901  BP: 107/59 103/64 113/62 113/62  Pulse: 78 83 79 80  Temp:  98.3 F (36.8 C)    TempSrc:  Oral    Resp: 27 26 20    Height:      Weight:      SpO2: 94% 92% 94%     Intake/Output Summary (Last 24 hours) at 08/22/15 1018 Last data filed at 08/22/15 0800  Gross per 24 hour  Intake 1669.59 ml  Output   1350 ml  Net 319.59 ml     Filed Weights   08/21/15 1452 08/22/15 0530  Weight: 63.05 kg (139 lb) 64.3 kg (141 lb 12.1 oz)    Exam:   Afebrile, VSS General:  Appears comfortable, calm. Lying in bed.  Eyes: PERRL, normal lids, irises ENT: grossly normal hearing, lips, tongue Neck: no LAD, masses, thyromegaly Cardiovascular: Regular rate and rhythm, no murmur, rub or gallop. No lower extremity edema. Telemetry: Sinus rhythm Respiratory: Clear to auscultation bilaterally, no wheezes, rales or rhonchi. Normal respiratory effort. Abdomen: soft, ntnd Skin: no rash or induration  Musculoskeletal: grossly normal tone bilateral upper and lower extremities Psychiatric: grossly normal mood and affect, speech fluent and appropriate Neurologic: grossly non-focal.  New data reviewed:  Troponin negative   CBC notable for WBC 13.3  BMP unremarkable  Potassium 3.6  Pertinent data since admission:  CXR 1. Mild enlargement of the cardiopericardial silhouette with cephalization of blood flow suggesting pulmonary venous hypertension. No overt edema. 2. Scarring at the left lung base. 3. Atherosclerotic aortic arch. 4. Low lung volumes are present, causing crowding of the pulmonary  Vasculature.  EKG afib, RVR  Pending data:    Scheduled Meds: . amLODipine  10 mg Oral Daily  . atenolol  50 mg Oral Daily  . LORazepam  1 mg Intravenous Once  . pantoprazole  40 mg Oral Daily  . potassium chloride  40 mEq Oral Once  . simvastatin  40 mg Oral  QPM  . sodium chloride  250 mL Intravenous Once  . sodium chloride  3 mL Intravenous Q12H  . Warfarin - Pharmacist Dosing Inpatient   Does not apply q1800   Continuous Infusions: . sodium chloride 50 mL/hr at 08/22/15 0700  . diltiazem (CARDIZEM) infusion 5 mg/hr (08/22/15 0955)    Principal Problem:   Atrial fibrillation with rapid ventricular response (HCC) Active Problems:   Hyperlipidemia   Essential hypertension   GERD   PAF (paroxysmal atrial fibrillation) ([place])   Anxiety    By signing my name below, I, Norma Barajas attest that this documentation has been prepared under the direction and in the presence of Norma Hodgkins, MD   Electronically signed: [name]  08/22/2015 10:10 AM   I personally performed the services described in this documentation. All medical record entries made by the scribe were at my direction. I have reviewed the chart and agree that the record reflects my personal performance and is accurate and complete. Norma Hodgkins, MD

## 2015-08-22 NOTE — Care Management Note (Signed)
Case Management Note  Patient Details  Name: DYANNE YORKS MRN: [id number] Date of Birth: 05/09/1941  Expected Discharge Date:                  Expected Discharge Plan:  Home/Self Care  In-House Referral:  NA  Discharge planning Services  CM Consult  Post Acute Care Choice:  NA Choice offered to:  NA  DME Arranged:    DME Agency:     HH Arranged:    [place] Agency:     Status of Service:  Completed, signed off  Medicare Important Message Given:    Date Medicare IM Given:    Medicare IM give by:    Date Additional Medicare IM Given:    Additional Medicare Important Message give by:     If discussed at [place] of Stay Meetings, dates discussed:    Additional Comments: Pt is from home, lives with husband and is ind at baseline with use of cane PRN. No HH services, DME or med needs prior to admission. Pt discharging home with self care today. No CM needs noted.  Sherald Barge, RN 08/22/2015, 4:39 PM

## 2015-08-23 ENCOUNTER — Other Ambulatory Visit: Payer: Self-pay | Admitting: *Deleted

## 2015-08-23 MED ORDER — FUROSEMIDE 20 MG PO TABS: 20.0000 mg | ORAL_TABLET | Freq: Every day | ORAL | Status: DC

## 2015-08-26 ENCOUNTER — Encounter: Payer: Self-pay | Admitting: Family Medicine

## 2015-08-26 ENCOUNTER — Ambulatory Visit (INDEPENDENT_AMBULATORY_CARE_PROVIDER_SITE_OTHER): Payer: Commercial Managed Care - HMO | Admitting: Family Medicine

## 2015-08-26 VITALS — BP 130/84 | HR 81 | Temp 97.3°F | Ht 61.0 in | Wt 134.2 lb

## 2015-08-26 DIAGNOSIS — I482 Chronic atrial fibrillation, unspecified: Secondary | ICD-10-CM

## 2015-08-26 DIAGNOSIS — I4891 Unspecified atrial fibrillation: Secondary | ICD-10-CM | POA: Diagnosis not present

## 2015-08-26 NOTE — Patient Instructions (Signed)
Great to see you!  You should hear from cardiology within 1 week  Continue your current meds, you do not need to take any more lasix.   If you develop chest pain, shortness of breath, cough, or fever, please come back right away  We will plan to check in with you in 2 weeks, we could repeat your x ray then to see if the fluid is gone, this is likely because of your rapid heart rate  Atrial Fibrillation Atrial fibrillation is a type of heartbeat that is irregular or fast (rapid). If you have this condition, your heart keeps quivering in a weird (chaotic) way. This condition can make it so your heart cannot pump blood normally. Having this condition gives a person more risk for stroke, heart failure, and other heart problems. There are different types of atrial fibrillation. Talk with your doctor to learn about the type that you have. HOME CARE  Take over-the-counter and prescription medicines only as told by your doctor.  If your doctor prescribed a blood-thinning medicine, take it exactly as told. Taking too much of it can cause bleeding. If you do not take enough of it, you will not have the protection that you need against stroke and other problems.  Do not use any tobacco products. These include cigarettes, chewing tobacco, and e-cigarettes. If you need help quitting, ask your doctor.  If you have apnea (obstructive sleep apnea), manage it as told by your doctor.  Do not drink alcohol.  Do not drink beverages that have caffeine. These include coffee, soda, and tea.  Maintain a healthy weight. Do not use diet pills unless your doctor says they are safe for you. Diet pills may make heart problems worse.  Follow diet instructions as told by your doctor.  Exercise regularly as told by your doctor.  Keep all follow-up visits as told by your doctor. This is important. GET HELP IF:  You notice a change in the speed, rhythm, or strength of your heartbeat.  You are taking a  blood-thinning medicine and you notice more bruising.  You get tired more easily when you move or exercise. GET HELP RIGHT AWAY IF:  You have pain in your chest or your belly (abdomen).  You have sweating or weakness.  You feel sick to your stomach (nauseous).  You notice blood in your throw up (vomit), poop (stool), or pee (urine).  You are short of breath.  You suddenly have swollen feet and ankles.  You feel dizzy.  Your suddenly get weak or numb in your face, arms, or legs, especially if it happens on one side of your body.  You have trouble talking, trouble understanding, or both.  Your face or your eyelid droops on one side. These symptoms may be an emergency. Do not wait to see if the symptoms will go away. Get medical help right away. Call your local emergency services (911 in the [place] Do not drive yourself to the hospital.   This information is not intended to replace advice given to you by your health care provider. Make sure you discuss any questions you have with your health care provider.   Document Released: 08/03/2008 Document Revised: 07/16/2015 Document Reviewed: 02/19/2015 Elsevier Interactive Patient Education [employer].

## 2015-08-26 NOTE — Progress Notes (Signed)
   HPI  Patient presents today here for hospital follow-up.  Patient was admitted last week with atrial fibrillation with rapid ventricular response. She was in the hospital overnight, ACS was ruled out, and her rate was controlled using IV diltiazem her oral beta blocker.  She states that thereafter she was discharged she had difficulty urinating and had shortness of breath. She was given oral Lasix which she took one dose and had a good diuresis. She states that she is breathing that her overall since leaving the hospital now.  She denies fever, malaise. She is very anxious about her atrial fibrillation She denies chest pain   PMH: Smoking status noted ROS: Per HPI  Objective: BP 130/84 mmHg  Pulse 81  Temp(Src) 97.3 F (36.3 C) (Oral)  Ht 5\' 1"  (1.549 m)  Wt 134 lb 3.2 oz (60.873 kg)  BMI 25.37 kg/m2 Gen: NAD, alert, cooperative with exam HEENT: NCAT CV: RRR, good S1/S2, no murmur Resp: CTABL, no wheezes, non-labored - no crackles Ext: No edema, warm Neuro: Alert and oriented, No gross deficits  Assessment and plan:  # Atrial fibrillation Rate controlled, on atenolol 50 mg Volume likely improved with rate control and single dose of Lasix, discontinue furosemide Consider digoxin, however with her age and intolerances several oral medicines previously I'm very hesitant to start this. Referred back to cardiology for consideration of additional rate controlling, wondering if she would be a tikosyn candidate I think her BB is maxed out due to her complaints of dizzines at her current BP.   # Pulmonary effusion Likely due to A. fib with RVR, I spent that this has improved Dyspnea has improved since hospitalization Discussed in detail signs and symptoms of pneumonia and reasons to return to the clinic Consider repeat chest x-ray in 2 weeks to look for resolution If persistent would consider repeat echo, however 3 months ago she had a very good EF 60-65%  Orders Placed This  Encounter  Procedures  . Ambulatory referral to Cardiology    Referral Priority:  Routine    Referral Type:  Consultation    Referral Reason:  Specialty Services Required    Requested Specialty:  Cardiology    Number of Visits Requested:  [place], MD [place] 08/26/2015, 2:29 PM

## 2015-08-28 ENCOUNTER — Telehealth: Payer: Self-pay | Admitting: Pharmacist

## 2015-08-28 NOTE — Telephone Encounter (Signed)
[place] for patient to come in early tomorrow and I will work in

## 2015-08-28 NOTE — Progress Notes (Signed)
Patient ID: Norma Barajas, female   DOB: [birth year], 74 y.o.   MRN: [id number]     Cardiology Office Note   Date:  08/29/2015   ID:  Norma Barajas, Norma Barajas 07/10/1941, MRN [id number]  PCP:  Kenn File, MD  Cardiologist:   Jenkins Rouge, MD   No chief complaint on file.     History of Present Illness: Norma Barajas is a 74 y.o. female   History of non obstructive CAD, mild mitral regurgitation, paroxysmal atrial flutter, HTN, HL, anxiety who brought to [facility] by EMS for afib with RVR. 04/10/15   Cardiac catheterization in 2006 showed non obstructive CAD. Had normal Lexiscan in 07/2013. . Intolerance to amiodarone. Echo 09/2012 LV EF of 60-65%, no WM abnormality, grade 1 DD, mild mitral reg, mild tri regur, pulmonary artery peak pressure 54mmg Hg, lipomatous atrial septum. . She fell early April of 2016 on her left shoulder and then she had a right Tibia/Fibula fracture surgery.   04/10/15 she woke up with a upset stomach without nausea or vomiting. She took laxative  for constipation. Then she developed palpitation, generalized discomfort and diaphoresis. She called EMS as she thought she back in Afib. She received SL nitro by EMS for Left chest tightness with mild SOB.  Converted with cardizem  Reviewed echo normal with EF 65%  Started on Eliquis and d/c home  In hospital again with PAF 10/16  Converted.  Had small left effusion and ? Some CHF.   Long discussion with [place] and husband.  She has anxiety and depression. Has not wanted to be on NOAC due to expense and INR being followed in [place] Have discussed antiarrhythmic Rx with her to decrease episodes of PAF and she has not wanted to try these. Given age and overall functional status do not Think ablation is appropriate  She will take and extra atenolol if she thinks she is in afib and let me know if she is willing to try AAT like flecainide   Past Medical History  Diagnosis Date  . Essential hypertension   . Erosive esophagitis   . GERD  (gastroesophageal reflux disease)   . Hypercholesterolemia   . Coronary atherosclerosis of native coronary artery     a. Nonobstructive minimal CAD 10/2005.  Marland Kitchen Paroxysmal atrial fibrillation (HCC)   . Diastolic dysfunction     Grade 1. Ejection fraction 60-65%.  . Osteoporosis   . PONV (postoperative nausea and vomiting)   . Heart murmur   . History of hiatal hernia   . Migraine     "used to have them right bad; I don't now" (04/11/2015)  . Vertigo   . Arthritis     "right leg" (04/11/2015)  . Anxiety   . Depression     Past Surgical History  Procedure Laterality Date  . Total abdominal hysterectomy    . Prolapsed uterine fibroid ligation  2015  . Cataract extraction w/ intraocular lens  implant, bilateral Bilateral   . Open reduction internal fixation (orif) tibia/fibula fracture Right 2013    broke tibia and fibula after falling down stairs  . Esophagogastroduodenoscopy (egd) with esophageal dilation  2001    Dr. [name]: erosive esophagitis, esophageal stricture, duodenitis, s/p Savary dilation  . Colonoscopy  2008    Dr. [name]: internal hemorrhoids   . Esophagogastroduodenoscopy (egd) with esophageal dilation  11/15/2012    [id number] web was found & MOST LIKELY CAUSE FOR DYAPHAGIA/Polyp was found in the gastric body and gastric fundus/ gastritis on  bx  . Bravo ph study  11/15/2012    Procedure: BRAVO [place];  Surgeon: [name], MD;  [facility]: [facility];  Service: Endoscopy;;  . Flexible sigmoidoscopy N/A 08/22/2014    Procedure: FLEXIBLE SIGMOIDOSCOPY;  Surgeon: [name], MD;  Location: [facility];  Service: Endoscopy;  Laterality: N/A;  830  . Hemorrhoid banding N/A 08/22/2014    Procedure: HEMORRHOID BANDING;  Surgeon: [name], MD;  Location: [facility];  Service: Endoscopy;  Laterality: N/A;  . Fracture surgery    . Eye surgery Bilateral     "laser OR after cataract OR; cause I couldn't see"  . Dilation and curettage of uterus        Current Outpatient Prescriptions  Medication Sig Dispense Refill  . ALPRAZolam (XANAX) 0.25 MG tablet TAKE 1 TABLET BY MOUTH TWICE A DAY AS NEEDED FOR ANXIETY, NERVES, OR SLEEP 60 tablet 3  . amLODipine (NORVASC) 10 MG tablet Take 1 tablet (10 mg total) by mouth daily. 90 tablet 3  . atenolol (TENORMIN) 50 MG tablet Take 1 tablet (50 mg total) by mouth daily. 90 tablet 3  . calcium citrate-vitamin D (CITRACAL+D) 315-200 MG-UNIT per tablet Take 2 tablets by mouth every morning.    . cholecalciferol (VITAMIN D) 1000 UNITS tablet Take 1,000 Units by mouth daily.    . Multiple Vitamin (MULTIVITAMIN) capsule Take 1 capsule by mouth every evening.    Marland Kitchen omeprazole (PRILOSEC) 20 MG capsule Take 1 capsule (20 mg total) by mouth daily.    . simvastatin (ZOCOR) 40 MG tablet Take 1 tablet (40 mg total) by mouth every evening. 90 tablet 4  . warfarin (COUMADIN) 3 MG tablet Take 1/2 to 1 tablet by mouth daily as directed by anticoagulation clinic. 30 tablet 1   No current facility-administered medications for this visit.    Allergies:   Crestor; Iodine; Iohexol; Morphine; Risedronate sodium; Triamterene-hctz; and Atorvastatin    Social History:  The patient  reports that she has never smoked. She has never used smokeless tobacco. She reports that she does not drink alcohol or use illicit drugs.   Family History:  The patient's family history includes Arthritis in her sister; Cancer in her sister; Cancer - Other in her sister; Colon cancer in her mother; Diabetes in her sister and son; Heart disease in her father and mother; Hip fracture in her mother; Hyperlipidemia in her brother; Hypertension in her brother; Osteoporosis in her mother and sister; Stroke in her brother, sister, and sister.    ROS:  Please see the history of present illness.   Otherwise, review of systems are positive for none.   All other systems are reviewed and negative.    PHYSICAL EXAM: VS:  BP 126/82 mmHg  Pulse 81  Ht  5\' 1"  (1.549 m)  Wt 60.328 kg (133 lb)  BMI 25.14 kg/m2  SpO2 94% , BMI Body mass index is 25.14 kg/(m^2). Affect appropriate Healthy:  appears stated age 11: normal Neck supple with no adenopathy JVP normal no bruits no thyromegaly Lungs clear with no wheezing and good diaphragmatic motion Heart:  S1/S2 no murmur, no rub, gallop or click PMI normal Abdomen: benighn, BS positve, no tenderness, no AAA no bruit.  No HSM or HJR Distal pulses intact with no bruits No edema Neuro non-focal Skin warm and dry No muscular weakness    EKG:   afib rate 158 nonspecific ST changes  04/11/15     Recent Labs: 04/10/2015:  Magnesium 2.1; TSH 1.483 08/21/2015: ALT 31 08/22/2015: BUN 8; Creatinine, Ser 0.79; Hemoglobin 12.5; Platelets 189; Potassium 3.6; Sodium 141    Lipid Panel    Component Value Date/Time   CHOL 162 04/11/2015 0410   CHOL 211* 11/26/2014 0841   CHOL 192 05/15/2013 0829   TRIG 131 04/11/2015 0410   TRIG 258* 03/19/2014 0812   TRIG 179* 05/15/2013 0829   HDL 43 04/11/2015 0410   HDL 45 11/26/2014 0841   HDL 45 03/19/2014 0812   HDL 44 05/15/2013 0829   CHOLHDL 3.8 04/11/2015 0410   CHOLHDL 4.7* 11/26/2014 0841   VLDL 26 04/11/2015 0410   LDLCALC 93 04/11/2015 0410   LDLCALC 117* 11/26/2014 0841   LDLCALC 101* 03/19/2014 0812   LDLCALC 112* 05/15/2013 0829      Wt Readings from Last 3 Encounters:  08/29/15 60.328 kg (133 lb)  08/26/15 60.873 kg (134 lb 3.2 oz)  08/22/15 64.3 kg (141 lb 12.1 oz)      Other studies Reviewed: Additional studies/ records that were reviewed today include: Epic notes DC summary and ECG's.    ASSESSMENT AND PLAN:  1.  PAF:  Continue atenolol and normal Myovue 6/13 normal no ischemia  Hesitant to start AAD Thinks NOACs caused back pain and too expensive On coumadin followed by primary [facility] in [place].  Trial of flecainide if patient wishes in future 3. Chol:  On statin  4. HTN:  Improved with increased atenolol   5.  Anxiety:  Chronic issue f/u primary consider adding Zoloft or Cymbalta    Current medicines are reviewed at length with the patient today.  The patient does not have concerns regarding medicines.  The following changes have been made:  None   Labs/ tests ordered today include:  None    F/u with me [place] 3 months     Signed, Jenkins Rouge, MD  08/29/2015 1:06 PM    [facility] [address], [place], [place]  [address] Phone: [phone]; Fax: [phone]

## 2015-08-29 ENCOUNTER — Encounter: Payer: Self-pay | Admitting: Cardiovascular Disease

## 2015-08-29 ENCOUNTER — Ambulatory Visit (INDEPENDENT_AMBULATORY_CARE_PROVIDER_SITE_OTHER): Payer: Commercial Managed Care - HMO | Admitting: Cardiovascular Disease

## 2015-08-29 ENCOUNTER — Ambulatory Visit (INDEPENDENT_AMBULATORY_CARE_PROVIDER_SITE_OTHER): Payer: Commercial Managed Care - HMO | Admitting: Pharmacist

## 2015-08-29 VITALS — BP 126/82 | HR 81 | Ht 61.0 in | Wt 133.0 lb

## 2015-08-29 DIAGNOSIS — I480 Paroxysmal atrial fibrillation: Secondary | ICD-10-CM | POA: Diagnosis not present

## 2015-08-29 LAB — POCT INR: INR: 1.5

## 2015-08-29 MED ORDER — WARFARIN SODIUM 3 MG PO TABS: ORAL_TABLET | ORAL | Status: DC

## 2015-08-29 NOTE — Patient Instructions (Signed)
Anticoagulation Dose Instructions as of 08/29/2015      Norma Barajas Tue Wed Thu Fri Sat   New Dose 1.5 mg 3 mg 1.5 mg 3 mg 1.5 mg 3 mg 3 mg    Description        Take 1 and 1/2 tablets today (10/21) and tomorrow (10/22) Then increase warfarin dose to 3mg  - take 1/2 tablet sundays, tuesdays and thrusdays.  Take 1 tablet all other days.     INR was 1.5 today (goal is 2.0 to 3.0)

## 2015-08-29 NOTE — Patient Instructions (Signed)
Your physician wants you to follow-up in: 3 months with Dr Blima Singer will receive a reminder letter in the mail two months in advance. If you don't receive a letter, please call our office to schedule the follow-up appointment.     Your physician recommends that you continue on your current medications as directed. Please refer to the Current Medication list given to you today.      If you need a refill on your cardiac medications before your next appointment, please call your pharmacy.      Thank you for choosing [facility] !

## 2015-09-02 ENCOUNTER — Ambulatory Visit: Payer: Self-pay | Admitting: Family

## 2015-09-04 ENCOUNTER — Ambulatory Visit (INDEPENDENT_AMBULATORY_CARE_PROVIDER_SITE_OTHER): Payer: Commercial Managed Care - HMO | Admitting: Pharmacist

## 2015-09-04 DIAGNOSIS — I482 Chronic atrial fibrillation, unspecified: Secondary | ICD-10-CM

## 2015-09-04 LAB — POCT INR: INR: 2.3

## 2015-09-04 NOTE — Patient Instructions (Signed)
Anticoagulation Dose Instructions as of 09/04/2015      Norma Barajas Tue Wed Thu Fri Sat   New Dose 1.5 mg 3 mg 1.5 mg 3 mg 1.5 mg 3 mg 3 mg    Description        Continue current warfarin dose to 3mg  - take 1/2 tablet sundays, tuesdays and thrusdays.  Take 1 tablet all other days.     INR was 2.3 today

## 2015-09-09 ENCOUNTER — Ambulatory Visit (INDEPENDENT_AMBULATORY_CARE_PROVIDER_SITE_OTHER): Payer: Commercial Managed Care - HMO | Admitting: Family Medicine

## 2015-09-09 ENCOUNTER — Encounter: Payer: Self-pay | Admitting: Family Medicine

## 2015-09-09 VITALS — BP 129/78 | HR 66 | Temp 97.1°F | Ht 61.0 in | Wt 134.2 lb

## 2015-09-09 DIAGNOSIS — Z0000 Encounter for general adult medical examination without abnormal findings: Secondary | ICD-10-CM

## 2015-09-09 DIAGNOSIS — Z7689 Persons encountering health services in other specified circumstances: Secondary | ICD-10-CM | POA: Insufficient documentation

## 2015-09-09 DIAGNOSIS — I10 Essential (primary) hypertension: Secondary | ICD-10-CM

## 2015-09-09 DIAGNOSIS — I482 Chronic atrial fibrillation, unspecified: Secondary | ICD-10-CM

## 2015-09-09 DIAGNOSIS — I480 Paroxysmal atrial fibrillation: Secondary | ICD-10-CM

## 2015-09-09 LAB — POCT INR: INR: 2.3

## 2015-09-09 NOTE — Progress Notes (Signed)
   HPI  Patient presents today here for follow-up of pulmonary effusion, and A. fib.  He denies racing heart, orthopnea, or difficulty breathing since our last visit She is recently seen her cardiologist and she feels very comfortable with the advice that he's given her about A. fib. She's compliant with all of her medications. She states that she is breathing much better than when she was in the hospital.  She would not to get a pneumonia shot today due to concern about side effects.  PMH: Smoking status noted ROS: Per HPI  Objective: BP 129/78 mmHg  Pulse 66  Temp(Src) 97.1 F (36.2 C) (Oral)  Ht 5\' 1"  (1.549 m)  Wt 134 lb 3.2 oz (60.873 kg)  BMI 25.37 kg/m2 Gen: NAD, alert, cooperative with exam HEENT: NCAT CV: RRR, good S1/S2, no murmur Resp: CTABL, no wheezes, non-labored Ext: No edema, warm Neuro: Alert and oriented, No gross deficits  Assessment and plan:  # A. fib Rate controlled, INR therapeutic Continue Coumadin, continue atenolol  # Pulmonary effusion Clinically resolved with clear lungs and improved breathing No need for follow-up x-ray at this time  # Hypertension Well-controlled on amlodipine and atenolol Labs up-to-date  # Healthcare maintenance [place], MD [facility] 09/09/2015, 9:27 AM

## 2015-09-09 NOTE — Patient Instructions (Signed)
Great to see you!  Come back to see me and Tammy in 4 weeks.

## 2015-09-11 ENCOUNTER — Other Ambulatory Visit: Payer: Self-pay | Admitting: *Deleted

## 2015-09-11 ENCOUNTER — Ambulatory Visit (INDEPENDENT_AMBULATORY_CARE_PROVIDER_SITE_OTHER): Payer: Commercial Managed Care - HMO | Admitting: *Deleted

## 2015-09-11 DIAGNOSIS — Z23 Encounter for immunization: Secondary | ICD-10-CM | POA: Diagnosis not present

## 2015-09-11 DIAGNOSIS — Z298 Encounter for other specified prophylactic measures: Secondary | ICD-10-CM

## 2015-09-11 NOTE — Addendum Note (Signed)
Addended by: Jamelle Haring on: 09/11/2015 11:35 AM   Modules accepted: Orders

## 2015-09-23 ENCOUNTER — Other Ambulatory Visit: Payer: Self-pay | Admitting: General Practice

## 2015-09-24 ENCOUNTER — Ambulatory Visit (INDEPENDENT_AMBULATORY_CARE_PROVIDER_SITE_OTHER): Payer: Commercial Managed Care - HMO | Admitting: Pharmacist

## 2015-09-24 DIAGNOSIS — I480 Paroxysmal atrial fibrillation: Secondary | ICD-10-CM

## 2015-09-24 LAB — POCT INR: INR: 2

## 2015-09-24 NOTE — Progress Notes (Signed)
Subjective:     Indication: atrial fibrillation Bleeding signs/symptoms: None Thromboembolic signs/symptoms: None  Missed Coumadin doses: None Medication changes: no Dietary changes: no Bacterial/viral infection: no Other concerns: yes - patient has osteoporosis and is not currently taking therapy to increase BMD.  She was prescribed alendronate but she decided not to take. I also checked on cost of Prolia for patient and her portion would be 20% or about $170 every 6 months. Patient declines due to cost.     Objective:    INR Today: 2.0 Current dose: *warfarin 3mg  - take 1/2 tablet mondays, wednesdays, fridays and saturdays.  Take 1 tablet all other days.*   Osteoporosis - refuses therapy  Assessment:    Therapeutic INR for goal of 2-3   Osteoporosis - refuses therapy  Plan:    1. New dose: no change   2. Next INR: 1 month   3.  Discussed increase risk of fracture, discussed complications for osteoporosis related fractures and effects on QOL.  Patient continues to refuse to take medication to increase BMD>   Cherre Robins, PharmD, CPP

## 2015-09-24 NOTE — Patient Instructions (Signed)
Anticoagulation Dose Instructions as of 09/24/2015      [name] Tue Wed Thu Fri Sat   New Dose 1.5 mg 3 mg 1.5 mg 3 mg 1.5 mg 3 mg 3 mg    Description        Continue current warfarin dose to 3mg  - take 1/2 tablet sundays, tuesdays and thrusdays.  Take 1 tablet all other days.

## 2015-10-21 ENCOUNTER — Ambulatory Visit (INDEPENDENT_AMBULATORY_CARE_PROVIDER_SITE_OTHER): Payer: Commercial Managed Care - HMO | Admitting: Family Medicine

## 2015-10-21 ENCOUNTER — Encounter: Payer: Self-pay | Admitting: Family Medicine

## 2015-10-21 VITALS — BP 120/78 | HR 77 | Temp 97.0°F | Ht 61.0 in | Wt 134.0 lb

## 2015-10-21 DIAGNOSIS — F411 Generalized anxiety disorder: Secondary | ICD-10-CM | POA: Diagnosis not present

## 2015-10-21 DIAGNOSIS — I480 Paroxysmal atrial fibrillation: Secondary | ICD-10-CM

## 2015-10-21 DIAGNOSIS — E785 Hyperlipidemia, unspecified: Secondary | ICD-10-CM

## 2015-10-21 DIAGNOSIS — K219 Gastro-esophageal reflux disease without esophagitis: Secondary | ICD-10-CM | POA: Diagnosis not present

## 2015-10-21 LAB — POCT INR: INR: 1.7

## 2015-10-21 MED ORDER — WARFARIN SODIUM 3 MG PO TABS: ORAL_TABLET | ORAL | Status: DC

## 2015-10-21 MED ORDER — OMEPRAZOLE 20 MG PO CPDR: 20.0000 mg | DELAYED_RELEASE_CAPSULE | Freq: Every day | ORAL | Status: DC

## 2015-10-21 MED ORDER — SIMVASTATIN 40 MG PO TABS: 40.0000 mg | ORAL_TABLET | Freq: Every evening | ORAL | Status: DC

## 2015-10-21 MED ORDER — ALPRAZOLAM 0.25 MG PO TABS: ORAL_TABLET | ORAL | Status: DC

## 2015-10-21 NOTE — Progress Notes (Signed)
   HPI  Patient presents today for three-month follow-up to discuss A. fib, anxiety, and given INR check.  Atrial fibrillation Good medication compliance with Coumadin and atenolol No recent heart racing  Hypertension Good medication compliance No chest pain, palpitations, leg edema  Xanax Has been on medication very long-term, or about 15 years. No SSRIs, she previously did not tolerate Zoloft. She denies depression, suicidal ideation. She takes on average 1 pill a night. She is not really open to adding additional anxiety medications, however she does state that Xanax is not working quite as effectively as it used to   PMH: Smoking status noted ROS: Per HPI  Objective: BP 120/78 mmHg  Pulse 77  Temp(Src) 97 F (36.1 C) (Oral)  Ht 5\' 1"  (1.549 m)  Wt 134 lb (60.782 kg)  BMI 25.33 kg/m2 Gen: NAD, alert, cooperative with exam HEENT: NCAT CV: RRR, good S1/S2, no murmur - normal rhythm today Resp: CTABL, no wheezes, non-labored Ext: No edema, warm Neuro: Alert and oriented, No gross deficits  Assessment and plan:  # Atrial fibrillation Rate controlled, INR subtherapeutic Coumadin dosing per below  # Hypertension Well-controlled No changes  # Anxiety Refilled Xanax, I had a long discussion with the patient discussing her increased anxiety medicine should involve an SSRI She declines SSRI today, refilled Xanax, I explained I will not increase her dose We also discussed increased risk of falls with Xanax  # Chronic anticoagulation for atrial fibrillation INR 1.7 today Her weekly dose is 16.5 mg, I have gone up by 1.5 mg, almost 10% See AVS for dosing schedule, the only changes one whole pill on Sunday where previously she took one half pill  Sunday - 1 pill Monday -1 pill Tuesday - 1/2  pill Wednesday -1 pill Thursday - 1/2  pill Friday- 1 pill Saturday -1 pill   Meds ordered this encounter  Medications  . ALPRAZolam (XANAX) 0.25 MG tablet    Sig:  TAKE 1 TABLET BY MOUTH TWICE A DAY AS NEEDED FOR ANXIETY, NERVES, OR SLEEP    Dispense:  60 tablet    Refill:  3  . omeprazole (PRILOSEC) 20 MG capsule    Sig: Take 1 capsule (20 mg total) by mouth daily.  . simvastatin (ZOCOR) 40 MG tablet    Sig: Take 1 tablet (40 mg total) by mouth every evening.    Dispense:  90 tablet    Refill:  4  . warfarin (COUMADIN) 3 MG tablet    Sig: Take 1/2 to 1 tablet by mouth daily as directed by anticoagulation clinic.    Dispense:  30 tablet    Refill:  [place], MD [facility] 10/21/2015, 9:25 AM

## 2015-10-21 NOTE — Patient Instructions (Signed)
Great to see you!  If you need more anxiety medicine I would recommend starting a second medicine rather than going up on xanax  Increase your coumadin: 3 mg tablets  Sunday - 1 pill Monday -1 pill Tuesday - 1/2  pill Wednesday -1 pill Thursday - 1/2  pill Friday- 1 pill Saturday -1 pill

## 2015-11-06 ENCOUNTER — Ambulatory Visit (INDEPENDENT_AMBULATORY_CARE_PROVIDER_SITE_OTHER): Payer: Commercial Managed Care - HMO | Admitting: Pharmacist

## 2015-11-06 DIAGNOSIS — I480 Paroxysmal atrial fibrillation: Secondary | ICD-10-CM | POA: Diagnosis not present

## 2015-11-06 LAB — POCT INR: INR: 2.4

## 2015-11-06 NOTE — Patient Instructions (Signed)
Anticoagulation Dose Instructions as of 11/06/2015      [name] Tue Wed Thu Fri Sat   New Dose 3 mg 3 mg 1.5 mg 3 mg 1.5 mg 3 mg 3 mg    Description        Continue current warfarin 3mg  dose of 1/2 tablet tuesdays and thursdays and 1 tablet all other days.     INR was 2.4 today

## 2015-11-13 DIAGNOSIS — H5210 Myopia, unspecified eye: Secondary | ICD-10-CM | POA: Diagnosis not present

## 2015-11-13 DIAGNOSIS — H524 Presbyopia: Secondary | ICD-10-CM | POA: Diagnosis not present

## 2015-11-26 ENCOUNTER — Encounter: Payer: Self-pay | Admitting: Pharmacist

## 2015-12-03 ENCOUNTER — Ambulatory Visit (INDEPENDENT_AMBULATORY_CARE_PROVIDER_SITE_OTHER): Payer: Commercial Managed Care - HMO | Admitting: Pharmacist

## 2015-12-03 ENCOUNTER — Ambulatory Visit: Payer: Commercial Managed Care - HMO | Admitting: Cardiovascular Disease

## 2015-12-03 DIAGNOSIS — I480 Paroxysmal atrial fibrillation: Secondary | ICD-10-CM

## 2015-12-03 LAB — POCT INR: INR: 2.8

## 2015-12-03 NOTE — Progress Notes (Signed)
Discussed starting prolia - patient declines due to cost.  She also declines any other treatment for osteoporosis such as bisphosphonate.

## 2015-12-03 NOTE — Patient Instructions (Signed)
Anticoagulation Dose Instructions as of 12/03/2015      [name] Tue Wed Thu Fri Sat   New Dose 3 mg 3 mg 1.5 mg 3 mg 1.5 mg 3 mg 3 mg    Description        Continue current warfarin 3mg  dose of 1/2 tablet tuesdays and thursdays and 1 tablet all other days.     INR was 2.8 today

## 2015-12-29 ENCOUNTER — Telehealth: Payer: Self-pay | Admitting: Pharmacist

## 2015-12-29 NOTE — Telephone Encounter (Signed)
Patient had some blood after BM this afternoon.  She thinks it is related to hemhrroids but would like INR checked sooner.  I offered appt today with after hours clinic and patient declined.  Appt made with anticoagulation clinic tomorrow.

## 2015-12-30 ENCOUNTER — Other Ambulatory Visit: Payer: Self-pay | Admitting: Family Medicine

## 2015-12-30 ENCOUNTER — Ambulatory Visit (INDEPENDENT_AMBULATORY_CARE_PROVIDER_SITE_OTHER): Payer: Commercial Managed Care - HMO | Admitting: Pharmacist Clinician (PhC)/ Clinical Pharmacy Specialist

## 2015-12-30 DIAGNOSIS — I480 Paroxysmal atrial fibrillation: Secondary | ICD-10-CM | POA: Diagnosis not present

## 2015-12-30 LAB — POCT INR: INR: 2.4

## 2016-01-07 ENCOUNTER — Encounter: Payer: Self-pay | Admitting: Pharmacist

## 2016-01-09 ENCOUNTER — Ambulatory Visit (INDEPENDENT_AMBULATORY_CARE_PROVIDER_SITE_OTHER): Payer: Commercial Managed Care - HMO | Admitting: Family Medicine

## 2016-01-09 ENCOUNTER — Encounter: Payer: Self-pay | Admitting: Family Medicine

## 2016-01-09 VITALS — BP 132/82 | HR 72 | Temp 97.2°F | Ht 61.0 in | Wt 138.2 lb

## 2016-01-09 DIAGNOSIS — K1370 Unspecified lesions of oral mucosa: Secondary | ICD-10-CM

## 2016-01-09 NOTE — Progress Notes (Signed)
   HPI  Patient presents today here for oral blisters and sounds in her ears.  Oral blisters She states they came up about 2 weeks ago. They are not irritating, not painful, and not causing any itching. They came on without any apparent reason. She's tried Tylenol with no improvement. They're covered by her denture plate  She describes a "bump bump" sound in herR ear. She denies any ha or pain associated She has occasional dizziness described as unsteadiness which is not really associated  PMH: Smoking status noted ROS: Per HPI  Objective: BP 132/82 mmHg  Pulse 72  Temp(Src) 97.2 F (36.2 C) (Oral)  Ht 5\' 1"  (1.549 m)  Wt 138 lb 3.2 oz (62.687 kg)  BMI 26.13 kg/m2 Gen: NAD, alert, cooperative with exam HEENT: NCAT, oropharynx with 4-7 small vesicles approximately 1-3 mm in diameter with clear fluid and erythematous centers all located on her hard palate,  normal TMs bilaterally  CV: RRR, good S1/S2, no murmur Resp: CTABL, no wheezes, non-labored Ext: No edema, warm Neuro: Alert and oriented, No gross deficits  Assessment and plan:  # Blisters, oral Unclear etiology, viral vs irritation from dentures Salt water rinses Watchful waiting RTC if worsening or doesn't resolve in  2weeks  Odd sounds in ears Normal TMs Unclear etiology Continue to follow for now.   Laroy Apple, MD [facility] 01/09/2016, 3:07 PM

## 2016-01-09 NOTE — Patient Instructions (Signed)
Great to see you!  Try warm salt water rinses twice daily for a week.   I expect you have a transient viral infection, or irritation from your dentures that should resolve.

## 2016-01-19 ENCOUNTER — Ambulatory Visit: Payer: Commercial Managed Care - HMO | Admitting: Family Medicine

## 2016-01-28 ENCOUNTER — Ambulatory Visit (INDEPENDENT_AMBULATORY_CARE_PROVIDER_SITE_OTHER): Payer: Commercial Managed Care - HMO | Admitting: Pharmacist

## 2016-01-28 DIAGNOSIS — I480 Paroxysmal atrial fibrillation: Secondary | ICD-10-CM | POA: Insufficient documentation

## 2016-01-28 LAB — COAGUCHEK XS/INR WAIVED
INR: 2.7 — AB (ref 0.9–1.1)
PROTHROMBIN TIME: 32.5 s

## 2016-01-28 MED ORDER — AMLODIPINE BESYLATE 10 MG PO TABS: 10.0000 mg | ORAL_TABLET | Freq: Every day | ORAL | Status: DC

## 2016-01-28 MED ORDER — WARFARIN SODIUM 3 MG PO TABS: ORAL_TABLET | ORAL | Status: DC

## 2016-01-28 NOTE — Patient Instructions (Signed)
Anticoagulation Dose Instructions as of 01/28/2016      Norma Barajas Tue Wed Thu Fri Sat   New Dose 3 mg 3 mg 1.5 mg 3 mg 1.5 mg 3 mg 3 mg    Description        Continue current warfarin 3mg  dose of 1/2 tablet tuesdays and thursdays and 1 tablet all other days.  Hemoglobin checked today in lab was 13.9mg /dL     INR was 2.7 today

## 2016-01-29 ENCOUNTER — Ambulatory Visit (INDEPENDENT_AMBULATORY_CARE_PROVIDER_SITE_OTHER): Payer: Commercial Managed Care - HMO | Admitting: Family Medicine

## 2016-01-29 ENCOUNTER — Encounter: Payer: Self-pay | Admitting: Family Medicine

## 2016-01-29 VITALS — BP 131/87 | HR 120 | Temp 97.4°F | Ht 61.0 in | Wt 138.8 lb

## 2016-01-29 DIAGNOSIS — I4891 Unspecified atrial fibrillation: Secondary | ICD-10-CM | POA: Diagnosis not present

## 2016-01-29 DIAGNOSIS — I10 Essential (primary) hypertension: Secondary | ICD-10-CM | POA: Diagnosis not present

## 2016-01-29 DIAGNOSIS — I480 Paroxysmal atrial fibrillation: Secondary | ICD-10-CM | POA: Diagnosis not present

## 2016-01-29 DIAGNOSIS — R0789 Other chest pain: Secondary | ICD-10-CM | POA: Diagnosis not present

## 2016-01-29 MED ORDER — ATENOLOL 50 MG PO TABS: 75.0000 mg | ORAL_TABLET | Freq: Every day | ORAL | Status: DC

## 2016-01-29 NOTE — Patient Instructions (Signed)
Increase atenolol to 1 & 1/2 tablets daily until you see Dr. Wendi Snipes in 2 weeks

## 2016-01-29 NOTE — Progress Notes (Signed)
Subjective:  Patient ID: Norma Barajas, female    DOB: 06/07/1941  Age: 75 y.o. MRN: [id number]  CC: Chest Pain   HPI Norma Barajas presents for awakened this AM at 1:30 with substernal chest pain. Ongoing now for several hours. It is a "hurting." Denies pressure, sharp, dull, heavy. Maybe a little burning, but not like her  Reflux. Denies radiation. No dyspnea. No nausea or diaphosesis. First episode since last fall. Hx of chronic A. Fib, but rate well controlled. This AM her pulse went to 128. Denies unusual activity. No unusual foods. Avoids caffeine. No dizziness currently (although she does have intermittent vertigo at times).   History Norma Barajas has a past medical history of Essential hypertension; Erosive esophagitis; GERD (gastroesophageal reflux disease); Hypercholesterolemia; Coronary atherosclerosis of native coronary artery; Paroxysmal atrial fibrillation ([place]); Diastolic dysfunction; Osteoporosis; PONV (postoperative nausea and vomiting); Heart murmur; History of hiatal hernia; Migraine; Vertigo; Arthritis; Anxiety; and Depression.   She has past surgical history that includes Total abdominal hysterectomy; Prolapsed uterine fibroid ligation (2015); Cataract extraction w/ intraocular lens  implant, bilateral (Bilateral); Open reduction internal fixation (orif) tibia/fibula fracture (Right, 2013); Esophagogastroduodenoscopy (egd) with esophageal dilation (2001); Colonoscopy (2008); Esophagogastroduodenoscopy (egd) with esophageal dilation (11/15/2012); BRAVO ph study (11/15/2012); Flexible sigmoidoscopy (N/A, 08/22/2014); Hemorrhoid banding (N/A, 08/22/2014); Fracture surgery; Eye surgery (Bilateral); and Dilation and curettage of uterus.   Her family history includes Arthritis in her sister; Cancer in her sister; Cancer - Other in her sister; Colon cancer in her mother; Diabetes in her sister and son; Heart disease in her father and mother; Hip fracture in her mother; Hyperlipidemia in her brother;  Hypertension in her brother; Osteoporosis in her mother and sister; Stroke in her brother, sister, and sister.She reports that she has never smoked. She has never used smokeless tobacco. She reports that she does not drink alcohol or use illicit drugs.    ROS Review of Systems  Constitutional: Negative for fever, activity change and appetite change.  HENT: Negative for congestion, rhinorrhea and sore throat.   Eyes: Negative for visual disturbance.  Respiratory: Negative for cough and shortness of breath.   Cardiovascular: Negative for chest pain and palpitations.  Gastrointestinal: Negative for nausea, abdominal pain and diarrhea.  Genitourinary: Negative for dysuria.  Musculoskeletal: Negative for myalgias and arthralgias.    Objective:  BP 131/87 mmHg  Pulse 120  Temp(Src) 97.4 F (36.3 C) (Oral)  Ht 5\' 1"  (1.549 m)  Wt 138 lb 12.8 oz (62.959 kg)  BMI 26.24 kg/m2  SpO2 95%  BP Readings from Last 3 Encounters:  01/29/16 131/87  01/09/16 132/82  10/21/15 120/78    Wt Readings from Last 3 Encounters:  01/29/16 138 lb 12.8 oz (62.959 kg)  01/09/16 138 lb 3.2 oz (62.687 kg)  10/21/15 134 lb (60.782 kg)     Physical Exam  Constitutional: She is oriented to person, place, and time. She appears well-developed and well-nourished. No distress.  HENT:  Head: Normocephalic and atraumatic.  Right Ear: External ear normal.  Left Ear: External ear normal.  Nose: Nose normal.  Mouth/Throat: Oropharynx is clear and moist.  Eyes: Conjunctivae and EOM are normal. Pupils are equal, round, and reactive to light.  Neck: Normal range of motion. Neck supple. No thyromegaly present.  Cardiovascular: S1 normal, S2 normal, intact distal pulses and normal pulses.  An irregularly irregular rhythm present. Tachycardia present.  PMI is not displaced.  Exam reveals no S3 and no S4.   No murmur heard. Pulmonary/Chest: Effort  normal and breath sounds normal. No respiratory distress. She has no  wheezes. She has no rales.  Abdominal: Soft. Bowel sounds are normal. She exhibits no distension. There is no tenderness.  Lymphadenopathy:    She has no cervical adenopathy.  Neurological: She is alert and oriented to person, place, and time. She has normal reflexes.  Skin: Skin is warm and dry.  Psychiatric: She has a normal mood and affect. Her behavior is normal. Judgment and thought content normal.     Lab Results  Component Value Date   WBC 13.3* 08/22/2015   HGB 12.5 08/22/2015   HCT 38.3 08/22/2015   PLT 189 08/22/2015   GLUCOSE 115* 08/22/2015   CHOL 162 04/11/2015   TRIG 131 04/11/2015   HDL 43 04/11/2015   LDLCALC 93 04/11/2015   ALT 31 08/21/2015   AST 34 08/21/2015   NA 141 08/22/2015   K 3.6 08/22/2015   CL 109 08/22/2015   CREATININE 0.79 08/22/2015   BUN 8 08/22/2015   CO2 25 08/22/2015   TSH 1.483 04/10/2015   INR 2.7* 01/28/2016   HGBA1C 5.6 04/04/2015     Assessment & Plan:   Norma Barajas was seen today for chest pain.  Diagnoses and all orders for this visit:  Other chest pain -     EKG 12-Lead  Essential hypertension -     atenolol (TENORMIN) 50 MG tablet; Take 1.5 tablets (75 mg total) by mouth daily.  Atrial fibrillation with RVR (HCC)  Paroxysmal atrial fibrillation (HCC)    EKG with rate 124 irreg, irreg, no ischemic change, nml axes  Since she is stable and rate is moderately fast only, she should follow as outpt. PRN for change in chest pain, dyspnea or further palpitation.  I have changed [name] atenolol. I am also having her maintain her multivitamin, cholecalciferol, calcium citrate-vitamin D, ALPRAZolam, omeprazole, simvastatin, warfarin, and amLODipine.  Meds ordered this encounter  Medications  . atenolol (TENORMIN) 50 MG tablet    Sig: Take 1.5 tablets (75 mg total) by mouth daily.    Dispense:  90 tablet    Refill:  3    05/02/15 dose increased to 50 mg daily     Follow-up: Return in about 2 weeks (around 02/12/2016) for  A. Fib, RVR. with Dr. Wendi Snipes.  [name], M.D.

## 2016-02-04 ENCOUNTER — Other Ambulatory Visit: Payer: Self-pay | Admitting: Family Medicine

## 2016-02-04 ENCOUNTER — Ambulatory Visit (INDEPENDENT_AMBULATORY_CARE_PROVIDER_SITE_OTHER): Payer: Commercial Managed Care - HMO | Admitting: Family Medicine

## 2016-02-04 ENCOUNTER — Encounter: Payer: Self-pay | Admitting: Family Medicine

## 2016-02-04 ENCOUNTER — Ambulatory Visit (INDEPENDENT_AMBULATORY_CARE_PROVIDER_SITE_OTHER): Payer: Commercial Managed Care - HMO

## 2016-02-04 VITALS — BP 128/92 | HR 116 | Temp 96.9°F | Ht 61.0 in | Wt 141.2 lb

## 2016-02-04 DIAGNOSIS — I482 Chronic atrial fibrillation, unspecified: Secondary | ICD-10-CM

## 2016-02-04 DIAGNOSIS — K121 Other forms of stomatitis: Secondary | ICD-10-CM

## 2016-02-04 DIAGNOSIS — K219 Gastro-esophageal reflux disease without esophagitis: Secondary | ICD-10-CM

## 2016-02-04 DIAGNOSIS — R0789 Other chest pain: Secondary | ICD-10-CM | POA: Diagnosis not present

## 2016-02-04 DIAGNOSIS — I10 Essential (primary) hypertension: Secondary | ICD-10-CM | POA: Diagnosis not present

## 2016-02-04 MED ORDER — TRIAMCINOLONE ACETONIDE 0.1 % MT PSTE: 1.0000 "application " | PASTE | Freq: Two times a day (BID) | OROMUCOSAL | Status: DC

## 2016-02-04 MED ORDER — FUROSEMIDE 40 MG PO TABS: 40.0000 mg | ORAL_TABLET | Freq: Every day | ORAL | Status: DC

## 2016-02-04 MED ORDER — POTASSIUM CHLORIDE CRYS ER 20 MEQ PO TBCR: 20.0000 meq | EXTENDED_RELEASE_TABLET | Freq: Every day | ORAL | Status: DC

## 2016-02-04 NOTE — Progress Notes (Signed)
Subjective:  Patient ID: Norma Barajas, female    DOB: 1941/01/01  Age: 75 y.o. MRN: [id number]  CC: Chest Pain and lesion in mouth   HPI Shatha H Redondo presents for [employer] presents for frequent episodes of exertional substernal chest pain. Ongoing now for several days intermittently (see OV of 3/23 notes)  It is a "hurting." Denies pressure, sharp, dull, heavy. Maybe a little burning, but not like her  Reflux. Denies radiation. Accompanied by dyspnea. No nausea or diaphosesis. F Hx of chronic A. Fib,  Rate high last week. Now taking Atenolol 1 q AM, 1/2 at noon.  Denies unusual activity. Occurs when going for her walk. No unusual foods. Avoids caffeine. No dizziness currently (although she does have intermittent vertigo at times). Pt. Concerned she hears herself gurgling and was told she had fluid around her heart last year.   History [place] has a past medical history of Essential hypertension; Erosive esophagitis; GERD (gastroesophageal reflux disease); Hypercholesterolemia; Coronary atherosclerosis of native coronary artery; Paroxysmal atrial fibrillation ([place]); Diastolic dysfunction; Osteoporosis; PONV (postoperative nausea and vomiting); Heart murmur; History of hiatal hernia; Migraine; Vertigo; Arthritis; Anxiety; and Depression.   She has past surgical history that includes Total abdominal hysterectomy; Prolapsed uterine fibroid ligation (2015); Cataract extraction w/ intraocular lens  implant, bilateral (Bilateral); Open reduction internal fixation (orif) tibia/fibula fracture (Right, 2013); Esophagogastroduodenoscopy (egd) with esophageal dilation (2001); Colonoscopy (2008); Esophagogastroduodenoscopy (egd) with esophageal dilation (11/15/2012); BRAVO ph study (11/15/2012); Flexible sigmoidoscopy (N/A, 08/22/2014); Hemorrhoid banding (N/A, 08/22/2014); Fracture surgery; Eye surgery (Bilateral); and Dilation and curettage of uterus.   Her family history includes Arthritis in her sister; Cancer in  her sister; Cancer - Other in her sister; Colon cancer in her mother; Diabetes in her sister and son; Heart disease in her father and mother; Hip fracture in her mother; Hyperlipidemia in her brother; Hypertension in her brother; Osteoporosis in her mother and sister; Stroke in her brother, sister, and sister.She reports that she has never smoked. She has never used smokeless tobacco. She reports that she does not drink alcohol or use illicit drugs.    ROS Review of Systems  Constitutional: Negative for fever, activity change and appetite change.  HENT: Positive for mouth sores. Negative for congestion, rhinorrhea and sore throat.   Eyes: Negative for visual disturbance.  Respiratory: Positive for shortness of breath. Negative for cough.   Cardiovascular: Positive for chest pain. Negative for palpitations.  Gastrointestinal: Negative for nausea, abdominal pain and diarrhea.  Genitourinary: Negative for dysuria.  Musculoskeletal: Negative for myalgias and arthralgias.    Objective:  BP 128/92 mmHg  Pulse 116  Temp(Src) 96.9 F (36.1 C) (Oral)  Ht 5' 1"  (1.549 m)  Wt 141 lb 3.2 oz (64.048 kg)  BMI 26.69 kg/m2  SpO2 95%  BP Readings from Last 3 Encounters:  02/04/16 128/92  01/29/16 131/87  01/09/16 132/82    Wt Readings from Last 3 Encounters:  02/04/16 141 lb 3.2 oz (64.048 kg)  01/29/16 138 lb 12.8 oz (62.959 kg)  01/09/16 138 lb 3.2 oz (62.687 kg)     Physical Exam  Constitutional: She is oriented to person, place, and time. She appears well-developed and well-nourished. No distress.  Obese   HENT:  Head: Normocephalic and atraumatic.  Right Ear: External ear normal.  Left Ear: External ear normal.  Nose: Nose normal.  Mouth/Throat: Oropharynx is clear and moist.    Moderate erythema at hard palate. 6 mm X 4 mm grade 1 ulcer  Eyes: Conjunctivae and EOM are normal. Pupils are equal, round, and reactive to light.  Neck: Normal range of motion. Neck supple. No  thyromegaly present.  Cardiovascular: Normal rate, S1 normal, S2 normal, normal heart sounds, intact distal pulses and normal pulses.  An irregularly irregular rhythm present. PMI is not displaced.  Exam reveals no S3 and no S4.   No murmur heard. Pulmonary/Chest: Effort normal and breath sounds normal. No respiratory distress. She has no wheezes. She has no rales.  Abdominal: Soft. Bowel sounds are normal. She exhibits no distension. There is no tenderness.  Lymphadenopathy:    She has no cervical adenopathy.  Neurological: She is alert and oriented to person, place, and time. She has normal reflexes.  Skin: Skin is warm and dry.  Psychiatric: She has a normal mood and affect. Her behavior is normal. Judgment and thought content normal.     Lab Results  Component Value Date   WBC 13.3* 08/22/2015   HGB 12.5 08/22/2015   HCT 38.3 08/22/2015   PLT 189 08/22/2015   GLUCOSE 115* 08/22/2015   CHOL 162 04/11/2015   TRIG 131 04/11/2015   HDL 43 04/11/2015   LDLCALC 93 04/11/2015   ALT 31 08/21/2015   AST 34 08/21/2015   NA 141 08/22/2015   K 3.6 08/22/2015   CL 109 08/22/2015   CREATININE 0.79 08/22/2015   BUN 8 08/22/2015   CO2 25 08/22/2015   TSH 1.483 04/10/2015   INR 2.7* 01/28/2016   HGBA1C 5.6 04/04/2015    Dg Chest 2 View  08/22/2015  CLINICAL DATA:  Cough for 1 week. EXAM: CHEST  2 VIEW COMPARISON:  August 21, 2015. FINDINGS: Stable cardiomediastinal silhouette. No pneumothorax is noted. Increased mild left pleural effusion is noted. Increased interstitial densities are noted throughout both lungs concerning for pulmonary edema. Bony thorax is unremarkable. IMPRESSION: Increased interstitial densities throughout both lungs concerning for pulmonary edema. Increase mild left pleural effusion. Electronically Signed   By: Marijo Conception, M.D.   On: 08/22/2015 12:08   Dg Chest Portable 1 View  08/21/2015  CLINICAL DATA:  Weakness.  Chest pain.  Shortness of breath. EXAM:  PORTABLE CHEST 1 VIEW COMPARISON:  02/06/2015 FINDINGS: Low lung volumes are present, causing crowding of the pulmonary vasculature. Atherosclerotic calcification of the aortic arch. Mild enlargement of the cardiopericardial silhouette with prominence of upper zone pulmonary vasculature. Linear opacity at the left lung base favoring subsegmental atelectasis or scar. No pleural effusion identified. IMPRESSION: 1. Mild enlargement of the cardiopericardial silhouette with cephalization of blood flow suggesting pulmonary venous hypertension. No overt edema. 2. Scarring at the left lung base. 3. Atherosclerotic aortic arch. 4. Low lung volumes are present, causing crowding of the pulmonary vasculature. Electronically Signed   By: Van Clines M.D.   On: 08/21/2015 15:41    Assessment & Plan:   Shanessa was seen today for chest pain and lesion in mouth.  Diagnoses and all orders for this visit:  Other chest pain -     EKG 12-Lead -     DG Chest 2 View; Future -     CBC with Differential/Platelet -     CMP14+EGFR -     Brain natriuretic peptide -     Ambulatory referral to Cardiology  Chronic atrial fibrillation [facility]) -     Ambulatory referral to Cardiology  Gastroesophageal reflux disease without esophagitis  Essential hypertension -     Ambulatory referral to Cardiology  Mouth ulcer  Other orders -  potassium chloride SA (K-DUR,KLOR-CON) 20 MEQ tablet; Take 1 tablet (20 mEq total) by mouth daily. -     furosemide (LASIX) 40 MG tablet; Take 1 tablet (40 mg total) by mouth daily. -     triamcinolone (KENALOG) 0.1 % paste; Use as directed 1 application in the mouth or throat 2 (two) times daily.    Meds ordered this encounter  Medications  . potassium chloride SA (K-DUR,KLOR-CON) 20 MEQ tablet    Sig: Take 1 tablet (20 mEq total) by mouth daily.    Dispense:  30 tablet    Refill:  3  . furosemide (LASIX) 40 MG tablet    Sig: Take 1 tablet (40 mg total) by mouth daily.     Dispense:  30 tablet    Refill:  3  . triamcinolone (KENALOG) 0.1 % paste    Sig: Use as directed 1 application in the mouth or throat 2 (two) times daily.    Dispense:  5 g    Refill:  12     Follow-up: No Follow-up on file.  [name], M.D.

## 2016-02-05 ENCOUNTER — Telehealth: Payer: Self-pay | Admitting: Pharmacist

## 2016-02-05 LAB — CBC WITH DIFFERENTIAL/PLATELET
BASOS ABS: 0 10*3/uL (ref 0.0–0.2)
Basos: 0 %
EOS (ABSOLUTE): 0.1 10*3/uL (ref 0.0–0.4)
Eos: 1 %
Hematocrit: 39.2 % (ref 34.0–46.6)
Hemoglobin: 13.1 g/dL (ref 11.1–15.9)
IMMATURE GRANS (ABS): 0 10*3/uL (ref 0.0–0.1)
Immature Granulocytes: 0 %
LYMPHS: 30 %
Lymphocytes Absolute: 2.8 10*3/uL (ref 0.7–3.1)
MCH: 28.9 pg (ref 26.6–33.0)
MCHC: 33.4 g/dL (ref 31.5–35.7)
MCV: 86 fL (ref 79–97)
MONOS ABS: 1 10*3/uL — AB (ref 0.1–0.9)
Monocytes: 11 %
NEUTROS ABS: 5.4 10*3/uL (ref 1.4–7.0)
Neutrophils: 58 %
PLATELETS: 204 10*3/uL (ref 150–379)
RBC: 4.54 x10E6/uL (ref 3.77–5.28)
RDW: 15.7 % — AB (ref 12.3–15.4)
WBC: 9.4 10*3/uL (ref 3.4–10.8)

## 2016-02-05 LAB — CMP14+EGFR
ALK PHOS: 66 IU/L (ref 39–117)
ALT: 14 IU/L (ref 0–32)
AST: 18 IU/L (ref 0–40)
Albumin/Globulin Ratio: 1.6 (ref 1.2–2.2)
Albumin: 4.1 g/dL (ref 3.5–4.8)
BILIRUBIN TOTAL: 0.2 mg/dL (ref 0.0–1.2)
BUN/Creatinine Ratio: 12 (ref 11–26)
BUN: 11 mg/dL (ref 8–27)
CHLORIDE: 99 mmol/L (ref 96–106)
CO2: 26 mmol/L (ref 18–29)
Calcium: 8.8 mg/dL (ref 8.7–10.3)
Creatinine, Ser: 0.91 mg/dL (ref 0.57–1.00)
GFR calc Af Amer: 70 mL/min/{1.73_m2} (ref >59)
GFR calc non Af Amer: 61 mL/min/{1.73_m2} (ref >59)
GLUCOSE: 114 mg/dL — AB (ref 65–99)
Globulin, Total: 2.5 g/dL (ref 1.5–4.5)
POTASSIUM: 4 mmol/L (ref 3.5–5.2)
Sodium: 141 mmol/L (ref 134–144)
TOTAL PROTEIN: 6.6 g/dL (ref 6.0–8.5)

## 2016-02-05 LAB — BRAIN NATRIURETIC PEPTIDE: BNP: 660.5 pg/mL — AB (ref 0.0–100.0)

## 2016-02-05 NOTE — Telephone Encounter (Signed)
Patient needed appt for protime for April. Appt made

## 2016-02-09 ENCOUNTER — Inpatient Hospital Stay (HOSPITAL_COMMUNITY)
Admission: AD | Admit: 2016-02-09 | Discharge: 2016-02-14 | DRG: 309 | Disposition: A | Discharge status: Home / Self Care | Payer: Commercial Managed Care - HMO | Source: Ambulatory Visit | Attending: Cardiology | Admitting: Cardiology

## 2016-02-09 ENCOUNTER — Encounter (HOSPITAL_COMMUNITY): Payer: Self-pay | Admitting: General Practice

## 2016-02-09 ENCOUNTER — Ambulatory Visit (INDEPENDENT_AMBULATORY_CARE_PROVIDER_SITE_OTHER): Payer: Commercial Managed Care - HMO | Admitting: Physician Assistant

## 2016-02-09 ENCOUNTER — Other Ambulatory Visit: Payer: Self-pay | Admitting: Physician Assistant

## 2016-02-09 ENCOUNTER — Encounter: Payer: Self-pay | Admitting: Physician Assistant

## 2016-02-09 VITALS — BP 105/80 | HR 120 | Ht 61.0 in | Wt 142.0 lb

## 2016-02-09 DIAGNOSIS — I4891 Unspecified atrial fibrillation: Secondary | ICD-10-CM | POA: Diagnosis not present

## 2016-02-09 DIAGNOSIS — I481 Persistent atrial fibrillation: Secondary | ICD-10-CM | POA: Diagnosis not present

## 2016-02-09 DIAGNOSIS — I4581 Long QT syndrome: Secondary | ICD-10-CM | POA: Diagnosis not present

## 2016-02-09 DIAGNOSIS — I480 Paroxysmal atrial fibrillation: Secondary | ICD-10-CM

## 2016-02-09 DIAGNOSIS — K219 Gastro-esophageal reflux disease without esophagitis: Secondary | ICD-10-CM | POA: Diagnosis present

## 2016-02-09 DIAGNOSIS — Z7901 Long term (current) use of anticoagulants: Secondary | ICD-10-CM

## 2016-02-09 DIAGNOSIS — F419 Anxiety disorder, unspecified: Secondary | ICD-10-CM | POA: Diagnosis present

## 2016-02-09 DIAGNOSIS — E7800 Pure hypercholesterolemia, unspecified: Secondary | ICD-10-CM | POA: Diagnosis present

## 2016-02-09 DIAGNOSIS — I5030 Unspecified diastolic (congestive) heart failure: Secondary | ICD-10-CM | POA: Diagnosis present

## 2016-02-09 DIAGNOSIS — I110 Hypertensive heart disease with heart failure: Secondary | ICD-10-CM | POA: Diagnosis present

## 2016-02-09 DIAGNOSIS — I2510 Atherosclerotic heart disease of native coronary artery without angina pectoris: Secondary | ICD-10-CM | POA: Diagnosis present

## 2016-02-09 DIAGNOSIS — I10 Essential (primary) hypertension: Secondary | ICD-10-CM | POA: Diagnosis present

## 2016-02-09 DIAGNOSIS — Z886 Allergy status to analgesic agent status: Secondary | ICD-10-CM

## 2016-02-09 DIAGNOSIS — E785 Hyperlipidemia, unspecified: Secondary | ICD-10-CM

## 2016-02-09 DIAGNOSIS — I080 Rheumatic disorders of both mitral and aortic valves: Secondary | ICD-10-CM

## 2016-02-09 DIAGNOSIS — I5033 Acute on chronic diastolic (congestive) heart failure: Secondary | ICD-10-CM

## 2016-02-09 DIAGNOSIS — R079 Chest pain, unspecified: Secondary | ICD-10-CM | POA: Diagnosis present

## 2016-02-09 DIAGNOSIS — I482 Chronic atrial fibrillation, unspecified: Secondary | ICD-10-CM

## 2016-02-09 DIAGNOSIS — I4892 Unspecified atrial flutter: Secondary | ICD-10-CM | POA: Diagnosis present

## 2016-02-09 DIAGNOSIS — Z888 Allergy status to other drugs, medicaments and biological substances status: Secondary | ICD-10-CM

## 2016-02-09 LAB — COMPREHENSIVE METABOLIC PANEL
ALBUMIN: 3.6 g/dL (ref 3.5–5.0)
ALT: 25 U/L (ref 14–54)
AST: 28 U/L (ref 15–41)
Alkaline Phosphatase: 65 U/L (ref 38–126)
Anion gap: 11 (ref 5–15)
BUN: 15 mg/dL (ref 6–20)
CHLORIDE: 104 mmol/L (ref 101–111)
CO2: 26 mmol/L (ref 22–32)
CREATININE: 1.14 mg/dL — AB (ref 0.44–1.00)
Calcium: 8.7 mg/dL — ABNORMAL LOW (ref 8.9–10.3)
GFR calc non Af Amer: 45 mL/min — ABNORMAL LOW (ref >60)
GFR, EST AFRICAN AMERICAN: 52 mL/min — AB (ref >60)
Glucose, Bld: 131 mg/dL — ABNORMAL HIGH (ref 65–99)
Potassium: 3.6 mmol/L (ref 3.5–5.1)
SODIUM: 141 mmol/L (ref 135–145)
Total Bilirubin: 0.3 mg/dL (ref 0.3–1.2)
Total Protein: 6.9 g/dL (ref 6.5–8.1)

## 2016-02-09 LAB — CBC WITH DIFFERENTIAL/PLATELET
BASOS ABS: 0 10*3/uL (ref 0.0–0.1)
BASOS PCT: 1 %
EOS ABS: 0.1 10*3/uL (ref 0.0–0.7)
EOS PCT: 2 %
HCT: 37.8 % (ref 36.0–46.0)
Hemoglobin: 12.1 g/dL (ref 12.0–15.0)
Lymphocytes Relative: 25 %
Lymphs Abs: 1.5 10*3/uL (ref 0.7–4.0)
MCH: 28.1 pg (ref 26.0–34.0)
MCHC: 32 g/dL (ref 30.0–36.0)
MCV: 87.7 fL (ref 78.0–100.0)
Monocytes Absolute: 0.6 10*3/uL (ref 0.1–1.0)
Monocytes Relative: 10 %
Neutro Abs: 3.8 10*3/uL (ref 1.7–7.7)
Neutrophils Relative %: 64 %
Platelets: 195 10*3/uL (ref 150–400)
RBC: 4.31 MIL/uL (ref 3.87–5.11)
RDW: 15.5 % (ref 11.5–15.5)
WBC: 6 10*3/uL (ref 4.0–10.5)

## 2016-02-09 LAB — BASIC METABOLIC PANEL
ANION GAP: 13 (ref 5–15)
BUN: 17 mg/dL (ref 6–20)
CALCIUM: 8.4 mg/dL — AB (ref 8.9–10.3)
CO2: 24 mmol/L (ref 22–32)
Chloride: 105 mmol/L (ref 101–111)
Creatinine, Ser: 1.02 mg/dL — ABNORMAL HIGH (ref 0.44–1.00)
GFR, EST AFRICAN AMERICAN: 60 mL/min — AB (ref >60)
GFR, EST NON AFRICAN AMERICAN: 52 mL/min — AB (ref >60)
GLUCOSE: 159 mg/dL — AB (ref 65–99)
POTASSIUM: 4 mmol/L (ref 3.5–5.1)
Sodium: 142 mmol/L (ref 135–145)

## 2016-02-09 LAB — BRAIN NATRIURETIC PEPTIDE: B NATRIURETIC PEPTIDE 5: 595.8 pg/mL — AB (ref 0.0–100.0)

## 2016-02-09 LAB — APTT: aPTT: 44 seconds — ABNORMAL HIGH (ref 24–37)

## 2016-02-09 LAB — PROTIME-INR
INR: 3.71 — AB (ref 0.00–1.49)
Prothrombin Time: 35.9 seconds — ABNORMAL HIGH (ref 11.6–15.2)

## 2016-02-09 LAB — TROPONIN I
Troponin I: <0.03 ng/mL (ref <0.031)
Troponin I: <0.03 ng/mL (ref <0.031)

## 2016-02-09 LAB — MAGNESIUM: MAGNESIUM: 1.9 mg/dL (ref 1.7–2.4)

## 2016-02-09 MED ORDER — METOPROLOL TARTRATE 25 MG PO TABS
25.0000 mg | ORAL_TABLET | Freq: Two times a day (BID) | ORAL | Status: DC
  Administered 2016-02-09: 12.5 mg via ORAL
  Administered 2016-02-10 – 2016-02-14 (×9): 25 mg via ORAL
  Filled 2016-02-09 (×10): qty 1

## 2016-02-09 MED ORDER — SODIUM CHLORIDE 0.9% FLUSH: 3.0000 mL | INTRAVENOUS | Status: DC | PRN

## 2016-02-09 MED ORDER — VITAMIN D 1000 UNITS PO TABS
1000.0000 [IU] | ORAL_TABLET | Freq: Every day | ORAL | Status: DC
Start: 2016-02-09 — End: 2016-02-14
  Administered 2016-02-10 – 2016-02-14 (×5): 1000 [IU] via ORAL
  Filled 2016-02-09 (×5): qty 1

## 2016-02-09 MED ORDER — FUROSEMIDE 40 MG PO TABS
40.0000 mg | ORAL_TABLET | Freq: Every day | ORAL | Status: DC
  Administered 2016-02-10 – 2016-02-14 (×5): 40 mg via ORAL
  Filled 2016-02-09 (×5): qty 1

## 2016-02-09 MED ORDER — POTASSIUM CHLORIDE ER 10 MEQ PO TBCR
40.0000 meq | EXTENDED_RELEASE_TABLET | Freq: Once | ORAL | Status: AC
  Administered 2016-02-09: 40 meq via ORAL
  Filled 2016-02-09 (×2): qty 4

## 2016-02-09 MED ORDER — ADULT MULTIVITAMIN W/MINERALS CH
1.0000 | ORAL_TABLET | Freq: Every day | ORAL | Status: DC
  Administered 2016-02-10 – 2016-02-14 (×5): 1 via ORAL
  Filled 2016-02-09 (×5): qty 1

## 2016-02-09 MED ORDER — WARFARIN SODIUM 3 MG PO TABS
1.5000 mg | ORAL_TABLET | Freq: Once | ORAL | Status: AC
  Administered 2016-02-09: 1.5 mg via ORAL
  Filled 2016-02-09: qty 2

## 2016-02-09 MED ORDER — CALCIUM CITRATE-VITAMIN D 315-200 MG-UNIT PO TABS: 2.0000 | ORAL_TABLET | ORAL | Status: DC

## 2016-02-09 MED ORDER — WARFARIN - PHARMACIST DOSING INPATIENT
Freq: Every day | Status: DC
  Administered 2016-02-12: 18:00:00

## 2016-02-09 MED ORDER — SODIUM CHLORIDE 0.9 % IV SOLN: 250.0000 mL | INTRAVENOUS | Status: DC | PRN

## 2016-02-09 MED ORDER — DILTIAZEM HCL 25 MG/5ML IV SOLN
10.0000 mg | Freq: Once | INTRAVENOUS | Status: AC
  Administered 2016-02-09: 10 mg via INTRAVENOUS
  Filled 2016-02-09: qty 5

## 2016-02-09 MED ORDER — ONDANSETRON HCL 4 MG/2ML IJ SOLN
4.0000 mg | Freq: Four times a day (QID) | INTRAMUSCULAR | Status: DC | PRN
  Administered 2016-02-09: 4 mg via INTRAVENOUS
  Filled 2016-02-09: qty 2

## 2016-02-09 MED ORDER — ACETAMINOPHEN 325 MG PO TABS: 650.0000 mg | ORAL_TABLET | ORAL | Status: DC | PRN

## 2016-02-09 MED ORDER — ALPRAZOLAM 0.25 MG PO TABS
0.2500 mg | ORAL_TABLET | Freq: Two times a day (BID) | ORAL | Status: DC | PRN
  Administered 2016-02-09 – 2016-02-13 (×3): 0.25 mg via ORAL
  Filled 2016-02-09 (×3): qty 1

## 2016-02-09 MED ORDER — MULTIVITAMINS PO CAPS: 1.0000 | ORAL_CAPSULE | Freq: Every evening | ORAL | Status: DC

## 2016-02-09 MED ORDER — SIMVASTATIN 40 MG PO TABS
40.0000 mg | ORAL_TABLET | Freq: Every evening | ORAL | Status: DC
  Administered 2016-02-09 – 2016-02-13 (×5): 40 mg via ORAL
  Filled 2016-02-09 (×5): qty 1

## 2016-02-09 MED ORDER — POTASSIUM CHLORIDE CRYS ER 20 MEQ PO TBCR
20.0000 meq | EXTENDED_RELEASE_TABLET | Freq: Every day | ORAL | Status: DC
  Administered 2016-02-10 – 2016-02-14 (×5): 20 meq via ORAL
  Filled 2016-02-09 (×6): qty 1

## 2016-02-09 MED ORDER — SODIUM CHLORIDE 0.9% FLUSH
3.0000 mL | Freq: Two times a day (BID) | INTRAVENOUS | Status: DC
  Administered 2016-02-10 – 2016-02-13 (×8): 3 mL via INTRAVENOUS

## 2016-02-09 MED ORDER — DOFETILIDE 250 MCG PO CAPS
250.0000 ug | ORAL_CAPSULE | Freq: Two times a day (BID) | ORAL | Status: DC
  Administered 2016-02-09 – 2016-02-10 (×2): 250 ug via ORAL
  Filled 2016-02-09 (×2): qty 1

## 2016-02-09 MED ORDER — DEXTROSE 5 % IV SOLN
5.0000 mg/h | INTRAVENOUS | Status: DC
  Administered 2016-02-09: 5 mg/h via INTRAVENOUS
  Administered 2016-02-09: 15 mg/h via INTRAVENOUS
  Filled 2016-02-09 (×4): qty 100

## 2016-02-09 MED ORDER — CALCIUM CARBONATE-VITAMIN D 500-200 MG-UNIT PO TABS
2.0000 | ORAL_TABLET | Freq: Every day | ORAL | Status: DC
  Administered 2016-02-10 – 2016-02-14 (×5): 2 via ORAL
  Filled 2016-02-09 (×6): qty 2

## 2016-02-09 MED ORDER — PANTOPRAZOLE SODIUM 40 MG PO TBEC
40.0000 mg | DELAYED_RELEASE_TABLET | Freq: Every day | ORAL | Status: DC
  Administered 2016-02-09 – 2016-02-14 (×6): 40 mg via ORAL
  Filled 2016-02-09 (×6): qty 1

## 2016-02-09 NOTE — Patient Instructions (Signed)
Medication Instructions:   Your physician recommends that you continue on your current medications as directed. Please refer to the Current Medication list given to you today.   If you need a refill on your cardiac medications before your next appointment, please call your pharmacy.  Labwork: NONE ORDER TODAY    Testing/Procedures: NONE ORDER TODAY    Follow-Up:  YOU HAVE BEEN RECCOMMENDED TO GO TO HOSPITAL TODAY FOR FURTHER EVALUATION    Any Other Special Instructions Will Be Listed Below (If Applicable).

## 2016-02-09 NOTE — Progress Notes (Signed)
Patient ID: Norma Barajas, female   DOB: [birth year], 75 y.o.   MRN: [id number] Patient examined chart reviewed Well known to me and take care of her husband Spent over 45 minutes face to face time with patient She has been very symptomatic with her afib She has been intolerant to amiodarone in past  And seen by Dr Rayann Heman who deferred Multaq or ablation Patient has also been hesitant to start AAT She has had more dyspnea and SSCP now with limited activity and much More frequent PAF. Dr Harrington Challenger had concern about coronary calcification on CT and 20% LM lesion on cath in 2006 Last myovue 2014 normal.  Currently in rapid afib rate 136 no meds started Basilar rales SEM Previously fractured RLE JVP mildly elevated Neruo non focal  INR is RX over 3 GFR 5 days ago 61  Plan.  She will be placed on iv cardizem for rate control and cut back beta blocker to once daily.  Tikosyn orders written for. I don't think Her "CAD" is prohibitive of flecainide But she cannot have f/u ETT to look for QRS widening due to previous broken leg and she is having SSCP which will require f/u myovue once converted.  Echo to assess EF with new symptoms of CHF hopefully just rate related.  Will give one dose lasix.  Discussed plans with her.  She would consider ablation if AAT fails as she just does Not tolerate rapid afib well at all.  EP to see in am Discussed with Dr Harrington Challenger , Dr Lovena Le and PA Almyra Deforest who say patient in office  Jenkins Rouge

## 2016-02-09 NOTE — Progress Notes (Signed)
i originally placed her on DCCV list for tomorrow, however it appears patient will be on tikosyn, will discuss with EP team of see if they want to delay DCCV to see if she would cardiovert on tikosyn.  Hilbert Corrigan PA Pager: [phone]

## 2016-02-09 NOTE — Progress Notes (Signed)
Pharmacy Review for Dofetilide (Tikosyn) Initiation  Admit Complaint: 75 y.o. female admitted 02/09/2016 with atrial fibrillation to be initiated on dofetilide.   Assessment:  Patient Exclusion Criteria: If any screening criteria checked as "Yes", then  patient  should NOT receive dofetilide until criteria item is corrected. If "Yes" please indicate correction plan.  YES  NO Patient  Exclusion Criteria Correction Plan  []  [x]  Baseline QTc interval is greater than or equal to 440 msec. IF above YES box checked dofetilide contraindicated unless patient has ICD; then may proceed if QTc 500-550 msec or with known ventricular conduction abnormalities may proceed with QTc 550-600 msec. QTc =  467   []  [x]  Magnesium level is less than 1.8 mEq/l : Last magnesium:  Lab Results  Component Value Date   MG 1.9 02/09/2016       n  [x]  []  Potassium level is less than 4 mEq/l : Last potassium:  Lab Results  Component Value Date   K 3.6 02/09/2016       Paged MD will need K to be supplemented and re draw labs prior to starting Tikosyn  []  [x]  Patient is known or suspected to have a digoxin level greater than 2 ng/ml: No results found for: DIGOXIN    []  [x]  Creatinine clearance less than 20 ml/min (calculated using Cockcroft-Gault, actual body weight and serum creatinine): Estimated Creatinine Clearance: 36.4 mL/min (by C-G formula based on Cr of 1.14).    []  [x]  Patient has received drugs known to prolong the QT intervals within the last 48 hours (phenothiazines, tricyclics or tetracyclic antidepressants, erythromycin, H-1 antihistamines, cisapride, fluoroquinolones, azithromycin). Drugs not listed above may have an, as yet, undetected potential to prolong the QT interval, updated information on QT prolonging agents is available at this website:QT prolonging agents   []  [x]  Patient received a dose of hydrochlorothiazide (Oretic) alone or in any combination including triamterene (Dyazide, Maxzide) in  the last 48 hours.   []  [x]  Patient received a medication known to increase dofetilide plasma concentrations prior to initial dofetilide dose:  . Trimethoprim (Primsol, Proloprim) in the last 36 hours . Verapamil (Calan, Verelan) in the last 36 hours or a sustained release dose in the last 72 hours . Megestrol (Megace) in the last 5 days  . Cimetidine (Tagamet) in the last 6 hours . Ketoconazole (Nizoral) in the last 24 hours . Itraconazole (Sporanox) in the last 48 hours  . Prochlorperazine (Compazine) in the last 36 hours    []  [x]  Patient is known to have a history of torsades de pointes; congenital or acquired long QT syndromes.   []  [x]  Patient has received a Class 1 antiarrhythmic with less than 2 half-lives since last dose. (Disopyramide, Quinidine, Procainamide, Lidocaine, Mexiletine, Flecainide, Propafenone)   []  [x]  Patient has received amiodarone therapy in the past 3 months or amiodarone level is greater than 0.3 ng/ml.    Patient has been appropriately anticoagulated with warfarin INR 3.7.  Ordering provider was confirmed at [email or web address] if they are not listed on the [place] Prescribers list.  Goal of Therapy: Follow renal function, electrolytes, potential drug interactions, and dose adjustment. Provide education and 1 week supply at discharge.  Plan:  []   Physician selected initial dose within range recommended for patients level of renal function - will monitor for response.  [x]   Physician selected initial dose outside of range recommended for patients level of renal function - will discuss if the dose should be altered at this time.  Select One Calculated CrCl  Dose q12h  []  > 60 ml/min 500 mcg  []  40-60 ml/min 250 mcg  [x]  20-40 ml/min 125 mcg   2. Follow up QTc after the first 5 doses, renal function, electrolytes (K & Mg) daily x 3 days, dose adjustment, success of initiation and facilitate 1 week discharge supply as clinically indicated.  3.  Initiate Tikosyn education video (Call [phone] and ask for video # [phone]).  4. Place Enrollment Form on the chart for discharge supply of dofetilide.   Duayne Cal 4:27 PM 02/09/2016

## 2016-02-09 NOTE — Progress Notes (Signed)
ANTICOAGULATION CONSULT NOTE - Initial Consult  Pharmacy Consult for warfarin Indication: atrial fibrillation  Allergies  Allergen Reactions  . Crestor [Rosuvastatin Calcium] Other (See Comments)    weakness  . Iodine     ivp dye  . Iohexol     "hot"  . Morphine Nausea Only  . Risedronate Sodium Other (See Comments)    ACTONEL REACTION: reflux  . Triamterene-Hctz Other (See Comments)    weakness  . Atorvastatin Other (See Comments)    Myalgias     Patient Measurements: Height: 5\' 2"  (157.5 cm) Weight: 139 lb 1.8 oz (63.1 kg) IBW/kg (Calculated) : 50.1  Vital Signs: Temp: 97.5 F (36.4 C) (04/03 1447) Temp Source: Oral (04/03 1447) BP: 113/68 mmHg (04/03 1447) Pulse Rate: 125 (04/03 1447)  Labs:  Recent Labs  02/09/16 1455  HGB 12.1  HCT 37.8  PLT 195  APTT 44*  LABPROT 35.9*  INR 3.71*  CREATININE 1.14*  TROPONINI <0.03    Estimated Creatinine Clearance: 36.1 mL/min (by C-G formula based on Cr of 1.14).   Medical History: Past Medical History  Diagnosis Date  . Essential hypertension   . Erosive esophagitis   . GERD (gastroesophageal reflux disease)   . Hypercholesterolemia   . Coronary atherosclerosis of native coronary artery     a. Nonobstructive minimal CAD 10/2005.  Marland Kitchen Paroxysmal atrial fibrillation (HCC)   . Diastolic dysfunction     Grade 1. Ejection fraction 60-65%.  . Osteoporosis   . PONV (postoperative nausea and vomiting)   . Heart murmur   . History of hiatal hernia   . Migraine     "used to have them right bad; I don't now" (04/11/2015)  . Vertigo   . Arthritis     "right leg" (04/11/2015)  . Anxiety   . Depression     Assessment: 1 yoF on warfarin PTA for afib admitted with afib with RVR x 2 weeks and Tikosyn initiation. INR 3.7 on admit with last dose on 4/2. CBC stable.   PTA dose: 3mg /day except take 1.5mg  on Tuesdays and Thursdays (last clinic visit 3/22)   Goal of Therapy:  INR 2-3 Monitor platelets by  anticoagulation protocol: Yes   Plan:  1. Warfarin 1.5 mg X 1 tonight 2. Daily INR and CBC  Vincenza Hews, PharmD, BCPS 02/09/2016, 4:57 PM Pager: [phone]

## 2016-02-09 NOTE — Progress Notes (Signed)
Patient's blood pressure was 123/73 and heart rate was 109. Cardizem was increased to 7.5mg /hr per orders

## 2016-02-09 NOTE — H&P (Signed)
Cardiology Office Note   Date:  02/09/2016   ID:  Tashiba, Rawlins February 24, 1941, MRN [id number]  PCP:  [name], MD  Cardiologist:  Dr. [name]  Chest pain with exertion and DOE    History of Present Illness: Norma Barajas is a 75 y.o. female who presents for cardiology follow-up. She has PMH of nonobstructive CAD, paroxysmal atrial flutter, hypertension, hyperlipidemia and history of anxiety. She had a prior cardiac catheterization in 2006 which showed nonobstructive CAD with 20% left main, 20% LAD, otherwise clean coronary. She had a normal Lexiscan in September 2014. She has a history of intolerance to amiodarone. She was previously admitted in June 2016 for atrial fibrillation with RVR.  Echo obtained on 04/11/2015 shows EF 65%, no regional wall motion abnormality, mild LVH. She was started on eliquis and discharged home. She was in the hospital again for recurrent A. fib in October 2016. Dr. [name] has previously discussed with the patient regarding antiarrhythmic treatment to decrease episodes of PAF, however she did not want to try this at the time. Given her age and overall functional status, it was felt she is likely not a good candidate for ablation.  She presents today for cardiology office evaluation. Looking back, it appears patient was admitted in last October for atrial fibrillation with RVR, she was converted on IV diltiazem. She was recently seen by her PCP on 01/29/2016 for evaluation of chest pain and found to be in atrial fibrillation with RVR. Her Tenormin was increased to 75 mg daily. She was seen back by her PCP on 02/04/2016, she continued to complain of dyspnea with exertion and chest pain. Her Tenormin was increased to 50 mg twice a day (apparently this was not updated in EPIC). She was also given 40 mg daily of Lasix for sign of fluid overload. Chest x-ray was obtained which was negative for acute pulmonary edema. CBC and BMET was negative as well. BNP was elevated at 660.5.  She presents today for cardiology evaluation. EKG shows atrial fibrillation with RVR with heart rate of 130. She states every time she moves around and do stuff she will have shortness breath with exertion and substernal chest discomfort. This is relieved with rest.   Past Medical History  Diagnosis Date  . Essential hypertension   . Erosive esophagitis   . GERD (gastroesophageal reflux disease)   . Hypercholesterolemia   . Coronary atherosclerosis of native coronary artery     a. Nonobstructive minimal CAD 10/2005.  Marland Kitchen Paroxysmal atrial fibrillation (HCC)   . Diastolic dysfunction     Grade 1. Ejection fraction 60-65%.  . Osteoporosis   . PONV (postoperative nausea and vomiting)   . Heart murmur   . History of hiatal hernia   . Migraine     "used to have them right bad; I don't now" (04/11/2015)  . Vertigo   . Arthritis     "right leg" (04/11/2015)  . Anxiety   . Depression     Past Surgical History  Procedure Laterality Date  . Total abdominal hysterectomy    . Prolapsed uterine fibroid ligation  2015  . Cataract extraction w/ intraocular lens  implant, bilateral Bilateral   . Open reduction internal fixation (orif) tibia/fibula fracture Right 2013    broke tibia and fibula after falling down stairs  . Esophagogastroduodenoscopy (egd) with esophageal dilation  2001    Dr. [name]: erosive esophagitis, esophageal stricture, duodenitis, s/p Savary dilation  . Colonoscopy  2008  Dr. [name]: internal hemorrhoids   . Esophagogastroduodenoscopy (egd) with esophageal dilation  11/15/2012    [id number] web was found & MOST LIKELY CAUSE FOR DYAPHAGIA/Polyp was found in the gastric body and gastric fundus/ gastritis on bx  . Bravo ph study  11/15/2012    Procedure: BRAVO [place] STUDY;  Surgeon: [name], MD;  Location: [facility];  Service: Endoscopy;;  . Flexible sigmoidoscopy N/A 08/22/2014    Procedure: FLEXIBLE SIGMOIDOSCOPY;  Surgeon: [name], MD;  Location: [facility];  Service: Endoscopy;  Laterality: N/A;  830  . Hemorrhoid banding N/A 08/22/2014    Procedure: HEMORRHOID BANDING;  Surgeon: [name], MD;  Location: [facility];  Service: Endoscopy;  Laterality: N/A;  . Fracture surgery    . Eye surgery Bilateral     "laser OR after cataract OR; cause I couldn't see"  . Dilation and curettage of uterus       Current Facility-Administered Medications  Medication Dose Route Frequency Provider Last Rate Last Dose  . 0.9 %  sodium chloride infusion  250 mL Intravenous PRN Josue Hector, MD      . acetaminophen (TYLENOL) tablet 650 mg  650 mg Oral Q4H PRN Almyra Deforest, PA      . diltiazem (CARDIZEM) 100 mg in dextrose 5 % 100 mL (1 mg/mL) infusion  5-15 mg/hr Intravenous Titrated Almyra Deforest, PA      . dofetilide (TIKOSYN) capsule 500 mcg  500 mcg Oral BID Josue Hector, MD      . ondansetron [facility]) injection 4 mg  4 mg Intravenous Q6H PRN Almyra Deforest, PA      . sodium chloride flush (NS) 0.9 % injection 3 mL  3 mL Intravenous Q12H Josue Hector, MD      . sodium chloride flush (NS) 0.9 % injection 3 mL  3 mL Intravenous PRN Josue Hector, MD        Allergies:   Crestor; Iodine; Iohexol; Morphine; Risedronate sodium; Triamterene-hctz; and Atorvastatin    Social History:  The patient  reports that she has never smoked. She has never used smokeless tobacco. She reports that she does not drink alcohol or use illicit drugs.   Family History:  The patient's family history includes Arthritis in her sister; Cancer in her sister; Cancer - Other in her sister; Colon cancer in her mother; Diabetes in her sister and son; Heart disease in her father and mother; Hip fracture in her mother; Hyperlipidemia in her brother; Hypertension in her brother; Osteoporosis in her mother and sister; Stroke in her brother, sister, and sister. There is no history of Heart attack.    ROS:  Please see the history of present illness.   Otherwise, review of systems are positive  for Chest pain with exertion, dyspnea on exertion.   All other systems are reviewed and negative.    PHYSICAL EXAM: VS:  BP 113/68 mmHg  Pulse 125  Temp(Src) 97.5 F (36.4 C) (Oral)  Resp 20  Ht 5\' 2"  (1.575 m)  Wt 141 lb 15.6 oz (64.4 kg)  BMI 25.96 kg/m2  SpO2 95% , BMI Body mass index is 25.96 kg/(m^2). GEN: Well nourished, well developed, in no acute distress HEENT: normal Neck: no carotid bruits, or masses +JVD Cardiac: Irregularly irregular; no murmurs, rubs, or gallops. 1+ pitting edema in bilateral LE Respiratory:  clear to auscultation bilaterally, normal work of breathing GI: soft, nontender, nondistended, + BS MS: no deformity or atrophy  Skin: warm and dry, no rash Neuro:  Strength and sensation are intact Psych: euthymic mood, full affect   EKG:  EKG is ordered today. The ekg ordered today demonstrates atrial fibrillation with RVR, heart rate 130   Recent Labs: 04/10/2015: Magnesium 2.1; TSH 1.483 02/04/2016: ALT 14; BNP 660.5*; BUN 11; Creatinine, Ser 0.91; Potassium 4.0; Sodium 141 02/09/2016: Hemoglobin 12.1; Platelets 195    Lipid Panel    Component Value Date/Time   CHOL 162 04/11/2015 0410   CHOL 211* 11/26/2014 0841   CHOL 192 05/15/2013 0829   TRIG 131 04/11/2015 0410   TRIG 258* 03/19/2014 0812   TRIG 179* 05/15/2013 0829   HDL 43 04/11/2015 0410   HDL 45 11/26/2014 0841   HDL 45 03/19/2014 0812   HDL 44 05/15/2013 0829   CHOLHDL 3.8 04/11/2015 0410   CHOLHDL 4.7* 11/26/2014 0841   VLDL 26 04/11/2015 0410   LDLCALC 93 04/11/2015 0410   LDLCALC 117* 11/26/2014 0841   LDLCALC 101* 03/19/2014 0812   LDLCALC 112* 05/15/2013 0829      Wt Readings from Last 3 Encounters:  02/09/16 141 lb 15.6 oz (64.4 kg)  02/09/16 142 lb (64.411 kg)  02/04/16 141 lb 3.2 oz (64.048 kg)      Other studies Reviewed: Additional studies/ records that were reviewed today include:   Echo 04/11/2015 LV EF:  65%  ------------------------------------------------------------------- Indications: Atrial fibrillation - 427.31.  ------------------------------------------------------------------- History: PMH: Murmur. Risk factors: Hypertension. Dyslipidemia.  ------------------------------------------------------------------- Study Conclusions  - Left ventricle: The cavity size was normal. Wall thickness was  increased in a pattern of mild LVH. The estimated ejection  fraction was 65%. Wall motion was normal; there were no regional  wall motion abnormalities. - Right ventricle: The cavity size was normal. Systolic function  was normal.    Cath 11/02/2005 Left main coronary had 20% discrete stenosis.  Left anterior descending artery had 20% multiple discrete stenoses in the mid and distal portion. First and second diagonal branches were normal.  Circumflex coronary artery was normal.  Right coronary artery was dominant and normal.  RAO VENTRICULOGRAPHY: RAO ventriculography showed hyperdynamic LV function. EF was 80%. There was no gradient across the aortic valve. No MR. Aortic pressure was in the 170/84 range. LV pressure was in the 170/17 range.  RAO ventriculography also showed no significant dilatation or dissection of the proximal aortic root.  Hand injection of the right femoral artery showed [place] to be in good position for AngioSeal. A 6-French collagen plug was deployed in the right femoral artery with good hemostasis.  The patient tolerated the procedure well.  IMPRESSION: Stable probably noncardiac chest pain. No evidence of proximal aortic root disease. Follow-up with Dr. Percival Spanish in three weeks to further assess her blood pressure.    Review of the above records demonstrates:   Previous nonobstructive CAD, admitted last time he in October 2016 with atrial fibrillation with RVR, converted on IV diltiazem. On chronic  Coumadin. She was seen by PCP on 01/29/2016 and noted to be in atrial fibrillation with RVR. Despite audible attempt at increasing her rate control medication, she has not converted out since. She is currently being evaluated in cardiology office for atrial fibrillation with RVR.    ASSESSMENT AND PLAN:  1.  Persistent atrial fibrillation with RVR on coumadin  - She was first noted to be back in A. fib with RVR on 01/29/2016, despite increase in Tenormin, her heart rate is remaining in the 130s. Although our records indicate she  is on Tenormin 75 mg daily, however according to the patient she was actually instructed to increase it further to 50 mg twice a day. Her blood pressure today is borderline at 105/80.   - Has been seen and examined along with Dr. Harrington Challenger, I doubt how much we can control her heart rate better. Given how bad she feels. I have discussed with M.D., plans to admit her to the hospital for inpatient DC cardioversion and consideration of starting Tikosyn. Her INR has been therapeutic for the past 3 month. We'll repeat INR today  - She has history of intolerance to amiodarone, flecainide is not a good option with CAD  - She is mildly fluid overloaded on exam today, however think this is heart rate related, and would improve on current dose of Lasix once she converted.  2. Exertional chest pain with dyspnea on exertion: EKG shows atrial fibrillation with RVR, I am not sure if her dyspnea on exertion along with chest pain is related to underlying coronary artery disease or related to RVR, likely heart rate related.  - Once she returned to normal sinus rhythm, if she continued to have symptom, we'll consider Myoview.  3. Nonobstructive CAD  - cardiac catheterization in 2006 which showed nonobstructive CAD with 20% left main, 20% LAD, otherwise clean coronary.  4. Hypertension: Currently on Tenormin 50 mg twice a day  5. Hyperlipidemia: Intolerant to Lipitor and Crestor, currently on  Zocor.    Current medicines are reviewed at length with the patient today.  The patient does not have concerns regarding medicines.  The following changes have been made:  no change  Labs/ tests ordered today include:   Orders Placed This Encounter  Procedures  . Magnesium  . APTT  . Basic metabolic panel  . Brain natriuretic peptide  . CBC WITH DIFFERENTIAL  . Comprehensive metabolic panel  . Lipid panel  . Protime-INR  . Troponin I  . Protime-INR  . Basic metabolic panel  . Magnesium  . Magnesium  . Basic metabolic panel  . Diet Heart Room service appropriate?: Yes; Fluid consistency:: Thin  . RN may order Cardiology PRN Orders (through additional orders) for the following patient needs; indigestion (Maalox)), cough (Robitussin DM), constipation (MOM), diarrhea (Imodium), hemorrhoids (Tucks), or minor skin irritation (Hydrocortisone Cream)  . Nurse to provide smoking / tobacco cessation education  . Bed rest with bathroom privileges  . Notify physician  . Vital signs  . Document cardiac rhythm  . Document HR and blood pressure q 15 min x 4, then q 30 min until at a stable dose or rhythm control achieved, then q 4 hr or more frequently to meet unit VS standard  . If diabetic or glucose greater than 140 mg/dl, notify MD for Glycemic Control (SSI) Order Set  . Initiate Clinical Pathway and Patient Education - Atrial Arrhythmia  . Maintain IV access or saline lock  . Target HR: 65-105 with return to baseline rhythm and hemodynamic stability  . Vital signs  . Cardiac monitoring  . Calculate and Document QTc   . Notify physician  . Notify physician  . Notify physician  . Notify physician  . Notify physician  . Do NOT administer Tikosyn until Potassium >/= 4 and Magnesium >/= 1.8  . Height and weight on admission  . Obtain and verify 7 day supply of medication from Pharmacy (for discharge) allow at least 1 business day for Pharmacy to order if necessary  . Initiate  Clinical Pathway and  Patient Education - Atrial Arrhythmia  . place order and obtain EKG 3 hours after the first and third dose of Tikosyn administered  . Activity as tolerated  . Full code  . warfarin (COUMADIN) per pharmacy consult  . Consult to care management  . dofetilide [facility]) pharmacy monitoring  . warfarin (COUMADIN) per pharmacy consult  . Oxygen therapy  . EKG 12-lead  . EKG 12-Lead on arrival  . EKG 12-Lead  . Admit to Inpatient (patient's expected length of stay will be greater than 2 midnights or inpatient only procedure)  . Admit to Inpatient (patient's expected length of stay will be greater than 2 midnights or inpatient only procedure)     Disposition:   FU with Dr. [name] depend on hospital course.  Hilbert Corrigan, [place]  02/09/2016 3:56 PM    [address], [place], [place]  [address] Phone: [phone]; Fax: [phone]     Patient seen and examined  75 yo with PAF  She presents again with afib with RVR  This has been going on for at least a couple wks  Symptoms of chest pressure and SOB correlate with when she was found to be in afib  One month ago she felt fine ON exam, neck veins are elevated  Lungs are rel clear  Cardiac exam:  Irreg irreg  No Se  Ext with trace edema  I would recomm admitting pt   Labs will be drawn.  She has been therapeutic on coumadin 2 wks ago and 1 month ago. If therapeurtic today and labs OK would recomm Tikosyn  Reviewed with pt.    Again, chest pressure prob related to rate and volume increase   I am not convinced on ongoing ischemia.

## 2016-02-09 NOTE — Progress Notes (Signed)
Per orders, cardizem increased to 10mg /hr. BP was 111/73 and heart rate was 117. Will continue to monitor.

## 2016-02-09 NOTE — Progress Notes (Signed)
Cardiology Office Note   Date:  02/09/2016   ID:  Afifa, Nesser Sep 21, 1941, MRN [id number]  PCP:  [name], MD  Cardiologist:  Dr. [name]  Chief Complaint  Patient presents with  . Follow-up    seen for Dr. [name]      History of Present Illness: KAILEA SCHWEND is a 75 y.o. female who presents for cardiology follow-up. She has PMH of nonobstructive CAD, paroxysmal atrial flutter, hypertension, hyperlipidemia and history of anxiety. She had a prior cardiac catheterization in 2006 which showed nonobstructive CAD with 20% left main, 20% LAD, otherwise clean coronary. She had a normal Lexiscan in September 2014. She has a history of intolerance to amiodarone. She was previously admitted in June 2016 for atrial fibrillation with RVR.  Echo obtained on 04/11/2015 shows EF 65%, no regional wall motion abnormality, mild LVH. She was started on eliquis and discharged home. She was in the hospital again for recurrent A. fib in October 2016. Dr. [name] has previously discussed with the patient regarding antiarrhythmic treatment to decrease episodes of PAF, however she did not want to try this at the time. Given her age and overall functional status, it was felt she is likely not a good candidate for ablation.  She presents today for cardiology office evaluation. Looking back, it appears patient was admitted in last October for atrial fibrillation with RVR, she was converted on IV diltiazem. She was recently seen by her PCP on 01/29/2016 for evaluation of chest pain and found to be in atrial fibrillation with RVR. Her Tenormin was increased to 75 mg daily. She was seen back by her PCP on 02/04/2016, she continued to complain of dyspnea with exertion and chest pain. Her Tenormin was increased to 50 mg twice a day (apparently this was not updated in EPIC). She was also given 40 mg daily of Lasix for sign of fluid overload. Chest x-ray was obtained which was negative for acute pulmonary edema. CBC and BMET  was negative as well. BNP was elevated at 660.5. She presents today for cardiology evaluation. EKG shows atrial fibrillation with RVR with heart rate of 130. She states every time she moves around and do stuff she will have shortness breath with exertion and substernal chest discomfort. This is relieved with rest.   Past Medical History  Diagnosis Date  . Essential hypertension   . Erosive esophagitis   . GERD (gastroesophageal reflux disease)   . Hypercholesterolemia   . Coronary atherosclerosis of native coronary artery     a. Nonobstructive minimal CAD 10/2005.  Marland Kitchen Paroxysmal atrial fibrillation (HCC)   . Diastolic dysfunction     Grade 1. Ejection fraction 60-65%.  . Osteoporosis   . PONV (postoperative nausea and vomiting)   . Heart murmur   . History of hiatal hernia   . Migraine     "used to have them right bad; I don't now" (04/11/2015)  . Vertigo   . Arthritis     "right leg" (04/11/2015)  . Anxiety   . Depression     Past Surgical History  Procedure Laterality Date  . Total abdominal hysterectomy    . Prolapsed uterine fibroid ligation  2015  . Cataract extraction w/ intraocular lens  implant, bilateral Bilateral   . Open reduction internal fixation (orif) tibia/fibula fracture Right 2013    broke tibia and fibula after falling down stairs  . Esophagogastroduodenoscopy (egd) with esophageal dilation  2001    Dr. [name]: erosive esophagitis, esophageal stricture,  duodenitis, s/p Savary dilation  . Colonoscopy  2008    Dr. [name]: internal hemorrhoids   . Esophagogastroduodenoscopy (egd) with esophageal dilation  11/15/2012    [id number] web was found & MOST LIKELY CAUSE FOR DYAPHAGIA/Polyp was found in the gastric body and gastric fundus/ gastritis on bx  . Bravo ph study  11/15/2012    Procedure: BRAVO [place] STUDY;  Surgeon: [name], MD;  Location: [facility];  Service: Endoscopy;;  . Flexible sigmoidoscopy N/A 08/22/2014    Procedure: FLEXIBLE SIGMOIDOSCOPY;   Surgeon: [name], MD;  Location: [facility];  Service: Endoscopy;  Laterality: N/A;  830  . Hemorrhoid banding N/A 08/22/2014    Procedure: HEMORRHOID BANDING;  Surgeon: [name], MD;  Location: [facility];  Service: Endoscopy;  Laterality: N/A;  . Fracture surgery    . Eye surgery Bilateral     "laser OR after cataract OR; cause I couldn't see"  . Dilation and curettage of uterus       Current Outpatient Prescriptions  Medication Sig Dispense Refill  . ALPRAZolam (XANAX) 0.25 MG tablet TAKE 1 TABLET BY MOUTH TWICE A DAY AS NEEDED FOR ANXIETY, NERVES, OR SLEEP 60 tablet 3  . atenolol (TENORMIN) 50 MG tablet Take 1.5 tablets (75 mg total) by mouth daily. 90 tablet 3  . calcium citrate-vitamin D (CITRACAL+D) 315-200 MG-UNIT per tablet Take 2 tablets by mouth every morning.    . cholecalciferol (VITAMIN D) 1000 UNITS tablet Take 1,000 Units by mouth daily.    . furosemide (LASIX) 40 MG tablet Take 1 tablet (40 mg total) by mouth daily. 30 tablet 3  . Multiple Vitamin (MULTIVITAMIN) capsule Take 1 capsule by mouth every evening.    Marland Kitchen omeprazole (PRILOSEC) 20 MG capsule Take 1 capsule (20 mg total) by mouth daily.    . potassium chloride SA (K-DUR,KLOR-CON) 20 MEQ tablet Take 1 tablet (20 mEq total) by mouth daily. 30 tablet 3  . simvastatin (ZOCOR) 40 MG tablet Take 1 tablet (40 mg total) by mouth every evening. 90 tablet 4  . triamcinolone (KENALOG) 0.1 % paste Use as directed 1 application in the mouth or throat 2 (two) times daily. 5 g 12  . warfarin (COUMADIN) 3 MG tablet TAKE 1/2 TO 1 TABLET BY MOUTH DAILY AS DIRECTED BY ANTICOAGULATION CLINIC. 90 tablet 1   No current facility-administered medications for this visit.    Allergies:   Crestor; Iodine; Iohexol; Morphine; Risedronate sodium; Triamterene-hctz; and Atorvastatin    Social History:  The patient  reports that she has never smoked. She has never used smokeless tobacco. She reports that she does not drink  alcohol or use illicit drugs.   Family History:  The patient's family history includes Arthritis in her sister; Cancer in her sister; Cancer - Other in her sister; Colon cancer in her mother; Diabetes in her sister and son; Heart disease in her father and mother; Hip fracture in her mother; Hyperlipidemia in her brother; Hypertension in her brother; Osteoporosis in her mother and sister; Stroke in her brother, sister, and sister. There is no history of Heart attack.    ROS:  Please see the history of present illness.   Otherwise, review of systems are positive for Chest pain with exertion, dyspnea on exertion.   All other systems are reviewed and negative.    PHYSICAL EXAM: VS:  BP 105/80 mmHg  Pulse 120  Ht 5\' 1"  (1.549 m)  Wt 142 lb (64.411 kg)  BMI 26.84 kg/m2 , BMI Body mass index is 26.84 kg/(m^2). GEN: Well nourished, well developed, in no acute distress HEENT: normal Neck: no carotid bruits, or masses +JVD Cardiac: Irregularly irregular; no murmurs, rubs, or gallops. 1+ pitting edema in bilateral LE Respiratory:  clear to auscultation bilaterally, normal work of breathing GI: soft, nontender, nondistended, + BS MS: no deformity or atrophy Skin: warm and dry, no rash Neuro:  Strength and sensation are intact Psych: euthymic mood, full affect   EKG:  EKG is ordered today. The ekg ordered today demonstrates atrial fibrillation with RVR, heart rate 130   Recent Labs: 04/10/2015: Magnesium 2.1; TSH 1.483 08/22/2015: Hemoglobin 12.5 02/04/2016: ALT 14; BNP 660.5*; BUN 11; Creatinine, Ser 0.91; Platelets 204; Potassium 4.0; Sodium 141    Lipid Panel    Component Value Date/Time   CHOL 162 04/11/2015 0410   CHOL 211* 11/26/2014 0841   CHOL 192 05/15/2013 0829   TRIG 131 04/11/2015 0410   TRIG 258* 03/19/2014 0812   TRIG 179* 05/15/2013 0829   HDL 43 04/11/2015 0410   HDL 45 11/26/2014 0841   HDL 45 03/19/2014 0812   HDL 44 05/15/2013 0829   CHOLHDL 3.8 04/11/2015 0410    CHOLHDL 4.7* 11/26/2014 0841   VLDL 26 04/11/2015 0410   LDLCALC 93 04/11/2015 0410   LDLCALC 117* 11/26/2014 0841   LDLCALC 101* 03/19/2014 0812   LDLCALC 112* 05/15/2013 0829      Wt Readings from Last 3 Encounters:  02/09/16 142 lb (64.411 kg)  02/04/16 141 lb 3.2 oz (64.048 kg)  01/29/16 138 lb 12.8 oz (62.959 kg)      Other studies Reviewed: Additional studies/ records that were reviewed today include:   Echo 04/11/2015 LV EF: 65%  ------------------------------------------------------------------- Indications: Atrial fibrillation - 427.31.  ------------------------------------------------------------------- History: PMH: Murmur. Risk factors: Hypertension. Dyslipidemia.  ------------------------------------------------------------------- Study Conclusions  - Left ventricle: The cavity size was normal. Wall thickness was  increased in a pattern of mild LVH. The estimated ejection  fraction was 65%. Wall motion was normal; there were no regional  wall motion abnormalities. - Right ventricle: The cavity size was normal. Systolic function  was normal.    Cath 11/02/2005 Left main coronary had 20% discrete stenosis.  Left anterior descending artery had 20% multiple discrete stenoses in the mid and distal portion. First and second diagonal branches were normal.  Circumflex coronary artery was normal.  Right coronary artery was dominant and normal.  RAO VENTRICULOGRAPHY: RAO ventriculography showed hyperdynamic LV function. EF was 80%. There was no gradient across the aortic valve. No MR. Aortic pressure was in the 170/84 range. LV pressure was in the 170/17 range.  RAO ventriculography also showed no significant dilatation or dissection of the proximal aortic root.  Hand injection of the right femoral artery showed [place] to be in good position for AngioSeal. A 6-French collagen plug was deployed in the right femoral artery  with good hemostasis.  The patient tolerated the procedure well.  IMPRESSION: Stable probably noncardiac chest pain. No evidence of proximal aortic root disease. Follow-up with Dr. Percival Spanish in three weeks to further assess her blood pressure.    Review of the above records demonstrates:   Previous nonobstructive CAD, admitted last time he in October 2016 with atrial fibrillation with RVR, converted on IV diltiazem. On chronic Coumadin. She was seen by PCP on 01/29/2016 and noted to be in atrial fibrillation with RVR. Despite audible attempt at increasing her rate control medication, she has not converted  out since. She is currently being evaluated in cardiology office for atrial fibrillation with RVR.    ASSESSMENT AND PLAN:  1.  Persistent atrial fibrillation with RVR on coumadin  - She was first noted to be back in A. fib with RVR on 01/29/2016, despite increase in Tenormin, her heart rate is remaining in the 130s. Although our records indicate she is on Tenormin 75 mg daily, however according to the patient she was actually instructed to increase it further to 50 mg twice a day. Her blood pressure today is borderline at 105/80.   - Has been seen and examined along with Dr. Harrington Challenger, I doubt how much we can control her heart rate better. Given how bad she feels. I have discussed with M.D., plans to admit her to the hospital for inpatient DC cardioversion and consideration of starting Tikosyn. Her INR has been therapeutic for the past 3 month. We'll repeat INR today  - She has history of intolerance to amiodarone, flecainide is not a good option with CAD  - She is mildly fluid overloaded on exam today, however think this is heart rate related, and would improve on current dose of Lasix once she converted.  2. Exertional chest pain with dyspnea on exertion: EKG shows atrial fibrillation with RVR, I am not sure if her dyspnea on exertion along with chest pain is related to underlying coronary  artery disease or related to RVR, likely heart rate related.  - Once she returned to normal sinus rhythm, if she continued to have symptom, we'll consider Myoview.  3. Nonobstructive CAD  - cardiac catheterization in 2006 which showed nonobstructive CAD with 20% left main, 20% LAD, otherwise clean coronary.  4. Hypertension: Currently on Tenormin 50 mg twice a day  5. Hyperlipidemia: Intolerant to Lipitor and Crestor, currently on Zocor.    Current medicines are reviewed at length with the patient today.  The patient does not have concerns regarding medicines.  The following changes have been made:  no change  Labs/ tests ordered today include:   Orders Placed This Encounter  Procedures  . EKG 12-Lead     Disposition:   FU with Dr. [name] depend on hospital course.  Hilbert Corrigan, [place]  02/09/2016 12:05 PM    [address], [place], [place]  [address] Phone: [phone]; Fax: [phone]   Patient seen and examined  75 yo with PAF  She presents again with afib with RVR  This has been going on for at least a couple wks  Symptoms of chest pressure and SOB correlate with when she was found to be in afib  One month ago she felt fine ON exam, neck veins are elevated  Lungs are rel clear  Cardiac exam:  Irreg irreg  No Se  Ext with trace edema  I would recomm admitting pt   Labs will be drawn.  She has been therapeutic on coumadin 2 wks ago and 1 month ago. If therapeurtic today and labs OK would recomm Tikosyn  Reviewed with pt.    Again, chest pressure prob related to rate and volume increase   I am not convinced on ongoing ischemia.  Dorris Carnes

## 2016-02-09 NOTE — Progress Notes (Signed)
Per physician's order, Cardizem was increased to 15mg /hr due to heart rate maintaining between 110-140's.

## 2016-02-10 ENCOUNTER — Ambulatory Visit: Payer: Commercial Managed Care - HMO | Admitting: Family Medicine

## 2016-02-10 ENCOUNTER — Encounter (HOSPITAL_COMMUNITY)
Admission: AD | Disposition: A | Discharge status: Home / Self Care | Payer: Self-pay | Source: Ambulatory Visit | Attending: Cardiovascular Disease

## 2016-02-10 LAB — BASIC METABOLIC PANEL
Anion gap: 10 (ref 5–15)
BUN: 19 mg/dL (ref 6–20)
CALCIUM: 8.2 mg/dL — AB (ref 8.9–10.3)
CO2: 26 mmol/L (ref 22–32)
CREATININE: 1 mg/dL (ref 0.44–1.00)
Chloride: 105 mmol/L (ref 101–111)
GFR, EST NON AFRICAN AMERICAN: 53 mL/min — AB (ref >60)
Glucose, Bld: 99 mg/dL (ref 65–99)
Potassium: 4.2 mmol/L (ref 3.5–5.1)
SODIUM: 141 mmol/L (ref 135–145)

## 2016-02-10 LAB — MAGNESIUM: Magnesium: 1.9 mg/dL (ref 1.7–2.4)

## 2016-02-10 LAB — LIPID PANEL
Cholesterol: 114 mg/dL (ref 0–200)
HDL: 29 mg/dL — AB (ref >40)
LDL CALC: 52 mg/dL (ref 0–99)
Total CHOL/HDL Ratio: 3.9 RATIO
Triglycerides: 165 mg/dL — ABNORMAL HIGH (ref <150)
VLDL: 33 mg/dL (ref 0–40)

## 2016-02-10 LAB — TROPONIN I

## 2016-02-10 LAB — PROTIME-INR
INR: 3.74 — AB (ref 0.00–1.49)
PROTHROMBIN TIME: 36.1 s — AB (ref 11.6–15.2)

## 2016-02-10 SURGERY — CARDIOVERSION
Anesthesia: Monitor Anesthesia Care

## 2016-02-10 MED ORDER — ZOLPIDEM TARTRATE 5 MG PO TABS
5.0000 mg | ORAL_TABLET | Freq: Once | ORAL | Status: AC
  Administered 2016-02-10: 5 mg via ORAL
  Filled 2016-02-10: qty 1

## 2016-02-10 MED ORDER — DOFETILIDE 125 MCG PO CAPS
125.0000 ug | ORAL_CAPSULE | Freq: Two times a day (BID) | ORAL | Status: DC
  Administered 2016-02-10: 125 ug via ORAL
  Filled 2016-02-10 (×2): qty 1

## 2016-02-10 MED ORDER — MAGNESIUM SULFATE IN D5W 10-5 MG/ML-% IV SOLN
1.0000 g | Freq: Once | INTRAVENOUS | Status: AC
  Administered 2016-02-10: 1 g via INTRAVENOUS
  Filled 2016-02-10: qty 100

## 2016-02-10 NOTE — Progress Notes (Signed)
Tikosyn insurance check completed for benefit coverage Pt copay will be $497- generic dosetilide available copay $410.35- medication is tier 4

## 2016-02-10 NOTE — Progress Notes (Signed)
[place] for warfarin Indication: atrial fibrillation  Allergies  Allergen Reactions  . Crestor [Rosuvastatin Calcium] Other (See Comments)    weakness  . Iodine     ivp dye  . Iohexol     "hot"  . Morphine Nausea Only  . Risedronate Sodium Other (See Comments)    ACTONEL REACTION: reflux  . Triamterene-Hctz Other (See Comments)    weakness  . Atorvastatin Other (See Comments)    Myalgias     Patient Measurements: Height: 5\' 2"  (157.5 cm) Weight: 139 lb 1.8 oz (63.1 kg) IBW/kg (Calculated) : 50.1  Vital Signs: Temp: 98.1 F (36.7 C) (04/04 0437) Temp Source: Oral (04/04 0437) BP: 102/63 mmHg (04/04 0437) Pulse Rate: 60 (04/04 0437)  Labs:  Recent Labs  02/09/16 1455 02/09/16 2024 02/10/16 0208  HGB 12.1  --   --   HCT 37.8  --   --   PLT 195  --   --   APTT 44*  --   --   LABPROT 35.9*  --  36.1*  INR 3.71*  --  3.74*  CREATININE 1.14* 1.02* 1.00  TROPONINI <0.03 <0.03 <0.03    Estimated Creatinine Clearance: 41.1 mL/min (by C-G formula based on Cr of 1).   Medical History: Past Medical History  Diagnosis Date  . Essential hypertension   . Erosive esophagitis   . GERD (gastroesophageal reflux disease)   . Hypercholesterolemia   . Coronary atherosclerosis of native coronary artery     a. Nonobstructive minimal CAD 10/2005.  Marland Kitchen Paroxysmal atrial fibrillation (HCC)   . Diastolic dysfunction     Grade 1. Ejection fraction 60-65%.  . Osteoporosis   . PONV (postoperative nausea and vomiting)   . Heart murmur   . History of hiatal hernia   . Migraine     "used to have them right bad; I don't now" (04/11/2015)  . Vertigo   . Arthritis     "right leg" (04/11/2015)  . Anxiety   . Depression     Assessment: 48 yoF on warfarin PTA for afib admitted with afib with RVR x 2 weeks and for Tikosyn initiation. INR 3.74 today, CBC stable. She is also noted on Tikosyn and has converted to NSR.   PTA dose: 3mg /day except  take 1.5mg  on Tuesdays and Thursdays (last clinic visit 3/22)   Goal of Therapy:  INR 2-3 Monitor platelets by anticoagulation protocol: Yes   Plan:  Hold coumadin  Daily INR and CBC  Hildred Laser, Pharm D 02/10/2016 10:13 AM

## 2016-02-10 NOTE — Progress Notes (Signed)
Utilization review completed.  

## 2016-02-10 NOTE — Progress Notes (Signed)
post Tikosyn EKG reviewed by Dr. Curt Bears, QTc prolongation noted, Tikosyn dose has been decreased to 123mcg BID.  Tommye Standard, PA-c

## 2016-02-10 NOTE — Care Management Note (Deleted)
Case Management Note  Patient Details  Name: Norma Barajas MRN: [id number] Date of Birth: 12/23/1940  Subjective/Objective:                    Action/Plan:   Expected Discharge Date:                  Expected Discharge Plan:     In-House Referral:     Discharge planning Services     Post Acute Care Choice:    Choice offered to:     DME Arranged:    DME Agency:     HH Arranged:    [place] Agency:     Status of Service:     Medicare Important Message Given:    Date Medicare IM Given:    Medicare IM give by:    Date Additional Medicare IM Given:    Additional Medicare Important Message give by:     If discussed at [place] of Stay Meetings, dates discussed:    Additional Comments:  Dawayne Patricia, RN 02/10/2016, 8:31 AM

## 2016-02-10 NOTE — Consult Note (Signed)
ELECTROPHYSIOLOGY CONSULT NOTE    Patient ID: Norma Barajas MRN: [id number], DOB/AGE: [birth year] 75 y.o.  Admit date: 02/09/2016 Date of Consult: 02/10/2016  Primary Physician: [name], MD Primary Cardiologist: Dr. [name]  Reason for Consultation: AFib, Tikosyn load  HPI: VANESS Barajas is a 75 y.o. female was referred to [facility] yesterday from the [place] office with AFib RVR, started on cardizem gtt with plans for DCCV though ultimately also started on Tikosyn and has since converted to SR.  EP is asked to manage her Tiksoyn load.  Her PMHx is Paroxysmal AFib/flutter, HTN, HLD and no obstructive CAD by cath in 2006.  She has been having recurrent PAFib episodes symptomatic with SOB and CP.  Most recently, seen by her PCP on 01/29/2016 for evaluation of chest pain and found to be in atrial fibrillation with RVR. Her Tenormin was increased to 75 mg daily. She was seen back by her PCP on 02/04/2016, she continued to complain of dyspnea with exertion and chest pain. Her Tenormin was increased to 50 mg twice a day (apparently this was not updated in EPIC). She was also given 40 mg daily of Lasix for sign of fluid overload. Chest x-ray was obtained which was negative for acute pulmonary edema. CBC and BMET was negative as well. BNP was elevated at 660.5. She presented yesterday for cardiology re-evaluation. EKG showed atrial fibrillation with RVR with heart rate of 130 with c/o every time she moved around to do stuff she was getting SOB and chest discomfort, relieved with rest and suspect secondary to RVR and sent to the hospital.  This morning she feels well, no rest symptoms, mentioned she got a little winded after up and around in her room some, no CP, she states she can tell she is back in rhythm.  Past Medical History  Diagnosis Date  . Essential hypertension   . Erosive esophagitis   . GERD (gastroesophageal reflux disease)   . Hypercholesterolemia   . Coronary atherosclerosis of native  coronary artery     a. Nonobstructive minimal CAD 10/2005.  Marland Kitchen Paroxysmal atrial fibrillation (HCC)   . Diastolic dysfunction     Grade 1. Ejection fraction 60-65%.  . Osteoporosis   . PONV (postoperative nausea and vomiting)   . Heart murmur   . History of hiatal hernia   . Migraine     "used to have them right bad; I don't now" (04/11/2015)  . Vertigo   . Arthritis     "right leg" (04/11/2015)  . Anxiety   . Depression      Surgical History:  Past Surgical History  Procedure Laterality Date  . Total abdominal hysterectomy    . Prolapsed uterine fibroid ligation  2015  . Cataract extraction w/ intraocular lens  implant, bilateral Bilateral   . Open reduction internal fixation (orif) tibia/fibula fracture Right 2013    broke tibia and fibula after falling down stairs  . Esophagogastroduodenoscopy (egd) with esophageal dilation  2001    Dr. [name]: erosive esophagitis, esophageal stricture, duodenitis, s/p Savary dilation  . Colonoscopy  2008    Dr. [name]: internal hemorrhoids   . Esophagogastroduodenoscopy (egd) with esophageal dilation  11/15/2012    [id number] web was found & MOST LIKELY CAUSE FOR DYAPHAGIA/Polyp was found in the gastric body and gastric fundus/ gastritis on bx  . Bravo ph study  11/15/2012    Procedure: BRAVO [place];  Surgeon: [name], MD;  Location: [facility];  Service: Endoscopy;;  .  Flexible sigmoidoscopy N/A 08/22/2014    Procedure: FLEXIBLE SIGMOIDOSCOPY;  Surgeon: [name], MD;  Location: [facility];  Service: Endoscopy;  Laterality: N/A;  830  . Hemorrhoid banding N/A 08/22/2014    Procedure: HEMORRHOID BANDING;  Surgeon: [name], MD;  Location: [facility];  Service: Endoscopy;  Laterality: N/A;  . Fracture surgery    . Eye surgery Bilateral     "laser OR after cataract OR; cause I couldn't see"  . Dilation and curettage of uterus       Prescriptions prior to admission  Medication Sig Dispense Refill Last Dose  .  ALPRAZolam (XANAX) 0.25 MG tablet TAKE 1 TABLET BY MOUTH TWICE A DAY AS NEEDED FOR ANXIETY, NERVES, OR SLEEP 60 tablet 3 02/08/2016 at Unknown time  . atenolol (TENORMIN) 50 MG tablet Take 50 mg by mouth 2 (two) times daily.   02/09/2016 at 8a  . calcium citrate-vitamin D (CITRACAL+D) 315-200 MG-UNIT per tablet Take 2 tablets by mouth every morning.   02/08/2016 at Unknown time  . cholecalciferol (VITAMIN D) 1000 UNITS tablet Take 1,000 Units by mouth daily.   02/08/2016 at Unknown time  . furosemide (LASIX) 40 MG tablet Take 1 tablet (40 mg total) by mouth daily. 30 tablet 3 02/08/2016 at Unknown time  . Multiple Vitamin (MULTIVITAMIN) capsule Take 1 capsule by mouth every evening.   02/08/2016 at Unknown time  . omeprazole (PRILOSEC) 20 MG capsule Take 1 capsule (20 mg total) by mouth daily.   02/09/2016 at Unknown time  . potassium chloride SA (K-DUR,KLOR-CON) 20 MEQ tablet Take 1 tablet (20 mEq total) by mouth daily. 30 tablet 3 02/09/2016 at Unknown time  . simvastatin (ZOCOR) 40 MG tablet Take 1 tablet (40 mg total) by mouth every evening. 90 tablet 4 02/08/2016 at Unknown time  . triamcinolone (KENALOG) 0.1 % paste Use as directed 1 application in the mouth or throat 2 (two) times daily. 5 g 12 02/08/2016 at Unknown time  . warfarin (COUMADIN) 3 MG tablet Take 1.5-3 mg by mouth See admin instructions. 3 mg on all days except on Tuesday and Thursday patient takes 1.5 mg   02/08/2016 at 7p    Inpatient Medications:  . calcium-vitamin D  2 tablet Oral Q breakfast  . cholecalciferol  1,000 Units Oral Daily  . dofetilide  250 mcg Oral BID  . furosemide  40 mg Oral Daily  . magnesium sulfate 1 - 4 g bolus IVPB  1 g Intravenous Once  . metoprolol tartrate  25 mg Oral BID  . multivitamin with minerals  1 tablet Oral Daily  . pantoprazole  40 mg Oral Daily  . potassium chloride SA  20 mEq Oral Daily  . simvastatin  40 mg Oral QPM  . sodium chloride flush  3 mL Intravenous Q12H  . Warfarin - Pharmacist Dosing  Inpatient   Does not apply q1800    Allergies:  Allergies  Allergen Reactions  . Crestor [Rosuvastatin Calcium] Other (See Comments)    weakness  . Iodine     ivp dye  . Iohexol     "hot"  . Morphine Nausea Only  . Risedronate Sodium Other (See Comments)    ACTONEL REACTION: reflux  . Triamterene-Hctz Other (See Comments)    weakness  . Atorvastatin Other (See Comments)    Myalgias     Social History   Social History  . Marital Status: Married    Spouse Name: N/A  . Number of Children:  N/A  . Years of Education: N/A   Occupational History  . Retired     Tourist information centre manager   Social History Main Topics  . Smoking status: Never Smoker   . Smokeless tobacco: Never Used  . Alcohol Use: No  . Drug Use: No  . Sexual Activity: Yes   Other Topics Concern  . Not on file   Social History Narrative   Married   No regular exercise     Family History  Problem Relation Age of Onset  . Colon cancer Mother     Diagnosed at age 33  . Heart disease Mother   . Osteoporosis Mother   . Hip fracture Mother   . Stroke Sister   . Diabetes Sister   . Cancer - Other Sister   . Osteoporosis Sister   . Arthritis Sister   . Cancer Sister     uterine  . Stroke Sister   . Heart disease Father   . Hyperlipidemia Brother   . Hypertension Brother   . Stroke Brother   . Diabetes Son   . Heart attack Neg Hx      Review of Systems: All other systems reviewed and are otherwise negative except as noted above.  Physical Exam: Filed Vitals:   02/09/16 2142 02/09/16 2310 02/10/16 0000 02/10/16 0437  BP: 101/55 95/69  102/63  Pulse: 90 82 56 60  Temp:    98.1 F (36.7 C)  TempSrc:    Oral  Resp:    20  Height:      Weight:      SpO2:    95%    GEN- The patient is well appearing, alert and oriented x 3 today.   HEENT: normocephalic, atraumatic; sclera clear, conjunctiva pink; hearing intact; oropharynx clear; neck supple, no JVP Lymph- no cervical lymphadenopathy Lungs- Clear to  ausculation bilaterally, normal work of breathing.  No wheezes, rales, rhonchi Heart- Regular rate and rhythm, no murmurs, rubs or gallops, PMI not laterally displaced GI- soft, non-tender, non-distended, bowel sounds present Extremities- no clubbing, cyanosis, trace if any edema MS- no significant deformity or atrophy Skin- warm and dry, no rash or lesion Psych- euthymic mood, full affect Neuro- no gross deficits observed  Labs:   Lab Results  Component Value Date   WBC 6.0 02/09/2016   HGB 12.1 02/09/2016   HCT 37.8 02/09/2016   MCV 87.7 02/09/2016   PLT 195 02/09/2016    Recent Labs Lab 02/09/16 1455  02/10/16 0208  NA 141  < > 141  K 3.6  < > 4.2  CL 104  < > 105  CO2 26  < > 26  BUN 15  < > 19  CREATININE 1.14*  < > 1.00  CALCIUM 8.7*  < > 8.2*  PROT 6.9  --   --   BILITOT 0.3  --   --   ALKPHOS 65  --   --   ALT 25  --   --   AST 28  --   --   GLUCOSE 131*  < > 99  < > = values in this interval not displayed.      EKG:  #1 AFib, RVR Post Tikosyn is SB, 56bpm,  is SR, QTc reviewed with Dr. Curt Bears, stable to continue current dose TELEMETRY: SR this morning, 70's  04/11/15: Echocardiogram Study Conclusions - Left ventricle: The cavity size was normal. Wall thickness was  increased in a pattern of mild LVH. The estimated ejection  fraction  was 65%. Wall motion was normal; there were no regional  wall motion abnormalities. - Right ventricle: The cavity size was normal. Systolic function  was normal. LA 11mm  Assessment and Plan:   1. Paroxysmal AFib     She has since converted to SR, Taytem Ghattas stop the cardizem gtt     EKG reviewed with Dr. Curt Bears, QTc is stable to continue current dose     K+ 4.2     Mag 1.9 (mag ordered)     Creat 1.00 (Calc Cr. Cl is 46.89) on 217mcg BID dose     CHA2DS2Vasc is at least 4 on warfarin, INR is 3.74, pharmacy management  2. CP/DOE     Mentioned she felt winded this morning when up and around, no chest discomfort      Ischemic w/u deferred to primary cardiology, notes mention ischemic w/u if continued symptoms in SR     Hx of NOD by cath 2006, negative myoview in 2014     Exam in neg for fluid OL  3. HTN     stable    Signed, Tommye Standard, PA-C 02/10/2016 8:51 AM   I have seen and examined this patient with Tommye Standard.  Agree with above, note added to reflect my findings.  On exam, regular rhythm, no murmurs, lungs clear.  Admit for AF with RVR.  Getting tikosyn but had to decrease dose due to long QTc on this AM ECG.  Valeriano Bain continue to monitor for a total of 6 doses.    Rogen Porte M. Whitman Meinhardt MD 02/10/2016 3:41 PM

## 2016-02-11 LAB — BASIC METABOLIC PANEL
Anion gap: 11 (ref 5–15)
BUN: 13 mg/dL (ref 6–20)
CALCIUM: 8.2 mg/dL — AB (ref 8.9–10.3)
CO2: 27 mmol/L (ref 22–32)
CREATININE: 0.99 mg/dL (ref 0.44–1.00)
Chloride: 104 mmol/L (ref 101–111)
GFR calc Af Amer: >60 mL/min (ref >60)
GFR calc non Af Amer: 54 mL/min — ABNORMAL LOW (ref >60)
GLUCOSE: 84 mg/dL (ref 65–99)
Potassium: 3.9 mmol/L (ref 3.5–5.1)
Sodium: 142 mmol/L (ref 135–145)

## 2016-02-11 LAB — MAGNESIUM: Magnesium: 2.2 mg/dL (ref 1.7–2.4)

## 2016-02-11 LAB — GLUCOSE, CAPILLARY: GLUCOSE-CAPILLARY: 94 mg/dL (ref 65–99)

## 2016-02-11 LAB — PROTIME-INR
INR: 3.23 — AB (ref 0.00–1.49)
PROTHROMBIN TIME: 32.4 s — AB (ref 11.6–15.2)

## 2016-02-11 MED ORDER — POTASSIUM CHLORIDE CRYS ER 20 MEQ PO TBCR
20.0000 meq | EXTENDED_RELEASE_TABLET | Freq: Once | ORAL | Status: AC
  Administered 2016-02-11: 20 meq via ORAL
  Filled 2016-02-11: qty 1

## 2016-02-11 NOTE — Clinical Documentation Improvement (Signed)
Cardiology  Can the diagnosis of "new symptoms of CHF" be further specified? Documentation found in 4/3 progress note by Dr. [name]. Please document findings in next progress note. Thank you.    Acuity - Acute, Chronic, Acute on Chronic   Type - Systolic, Diastolic, Systolic and Diastolic   Chronic CHF can be coded per ICD 10 guidelines  Other  Clinically Undetermined  Document any associated diagnoses/conditions  Supporting Information:  "with new symptoms of CHF hopefully just rate related. Will give one dose lasix"  "chest pressure prob related to rate and volume increase" documented in H&P   Currently on Lasix PO 40 mg daily  BNP was 596  Please exercise your independent, professional judgment when responding. A specific answer is not anticipated or expected.  Thank You,  Zoila Shutter RN, BSN, [place] [phone]; Cell: [phone]

## 2016-02-11 NOTE — Progress Notes (Signed)
SUBJECTIVE: The patient is doing well today.  At this time, she denies chest pain, shortness of breath, or any new concerns.  . calcium-vitamin D  2 tablet Oral Q breakfast  . cholecalciferol  1,000 Units Oral Daily  . dofetilide  125 mcg Oral BID  . furosemide  40 mg Oral Daily  . metoprolol tartrate  25 mg Oral BID  . multivitamin with minerals  1 tablet Oral Daily  . pantoprazole  40 mg Oral Daily  . potassium chloride SA  20 mEq Oral Daily  . simvastatin  40 mg Oral QPM  . sodium chloride flush  3 mL Intravenous Q12H  . Warfarin - Pharmacist Dosing Inpatient   Does not apply q1800      OBJECTIVE: Physical Exam: Filed Vitals:   02/10/16 1148 02/10/16 1340 02/10/16 2108 02/11/16 0521  BP: 103/59 92/57 111/63 118/66  Pulse: 66 66 72 66  Temp:  97.7 F (36.5 C) 98 F (36.7 C) 97.8 F (36.6 C)  TempSrc:  Oral Oral Oral  Resp:  18 18 18   Height:      Weight:      SpO2:  96% 95% 94%    Intake/Output Summary (Last 24 hours) at 02/11/16 0644 Last data filed at 02/11/16 0523  Gross per 24 hour  Intake    840 ml  Output    904 ml  Net    -64 ml    Telemetry reveals SR   GEN- The patient is well appearing, alert and oriented x 3 today.   Head- normocephalic, atraumatic Eyes-  Sclera clear, conjunctiva pink Ears- hearing intact Oropharynx- clear Neck- supple, no JVP Lungs- Clear to ausculation bilaterally, normal work of breathing Heart- Regular rate and rhythm, no significant murmurs, no rubs or gallops GI- soft, NT, ND, + BS Extremities- no clubbing, cyanosis, or edema Skin- no rash or lesion Psych- euthymic mood, full affect Neuro- no gross deficits appreciated  LABS: Basic Metabolic Panel:  Recent Labs  02/10/16 0208 02/11/16 0352  NA 141 142  K 4.2 3.9  CL 105 104  CO2 26 27  GLUCOSE 99 84  BUN 19 13  CREATININE 1.00 0.99  CALCIUM 8.2* 8.2*  MG 1.9 2.2   Liver Function Tests:  Recent Labs  02/09/16 1455  AST 28  ALT 25  ALKPHOS 65    BILITOT 0.3  PROT 6.9  ALBUMIN 3.6   CBC:  Recent Labs  02/09/16 1455  WBC 6.0  NEUTROABS 3.8  HGB 12.1  HCT 37.8  MCV 87.7  PLT 195   Cardiac Enzymes:  Recent Labs  02/09/16 1455 02/09/16 2024 02/10/16 0208  TROPONINI <0.03 <0.03 <0.03    Recent Labs  02/10/16 0208  CHOL 114  HDL 29*  LDLCALC 52  TRIG 165*  CHOLHDL 3.9    ASSESSMENT AND PLAN:  1. Paroxysmal AFib  remains ins SR this morning     Tikosyn was down-titrated yesterday to 155mcg BID secondary to QT prolongation  QTc this morning is is reviewed with Dr. Curt Bears, stable to continue the current dose, unfortunately thepatient states she Symphany Fleissner not be able to afford the cost after case management visit.  We [name] [place] discuss any financial assistance programs case management may be able to help out with, otherwise we Shavell Nored need to consider other rhythm management options. Perhaps a stress test to consider flecainide.  K+ 3.9 Jayven Naill replace  Mag 2.3  Creat 0.99 (Calc CrCl is 47.37)  CHA2DS2Vasc is at least  4 on warfarin, INR is 3.23, pharmacy management  2. CP/DOE  Feels like she tires easily when she was up to bathe, no CP  Ischemic w/u deferred to primary cardiology, notes mention ischemic w/u if continued symptoms in SR  Hx of NOD by cath 2006, negative myoview in 2014  Exam in neg for fluid OL  3. HTN   stable    Tommye Standard, PA-C 02/11/2016 6:44 AM   I have seen and examined this patient with Tommye Standard.  Agree with above, note added to reflect my findings.  On exam, regular rhythm, no murmurs, lungs clear.  Has been on tikosyn load for AF.  Tolerating well at 125 mcg but cost as outpatient is prohibitive.  Cannot take amiodarone or flecainide (20% LM disease).  Elsi Stelzer ask case management about the cost of sotalol for therapy for AF.    Shaylene Paganelli M. Kyel Purk MD 02/11/2016 9:52 AM

## 2016-02-11 NOTE — Progress Notes (Signed)
Insurance check completed for Sotalol (80 mg vs 160 mg BID) coverage Pt copay for 80mg  will be $8.80 fo160mg  copay will be $20 -prior auth not required

## 2016-02-11 NOTE — Progress Notes (Signed)
[place] for warfarin Indication: atrial fibrillation  Allergies  Allergen Reactions  . Crestor [Rosuvastatin Calcium] Other (See Comments)    weakness  . Iodine     ivp dye  . Iohexol     "hot"  . Morphine Nausea Only  . Risedronate Sodium Other (See Comments)    ACTONEL REACTION: reflux  . Triamterene-Hctz Other (See Comments)    weakness  . Atorvastatin Other (See Comments)    Myalgias     Patient Measurements: Height: 5\' 2"  (157.5 cm) Weight: 139 lb 1.8 oz (63.1 kg) IBW/kg (Calculated) : 50.1  Vital Signs: Temp: 97.8 F (36.6 C) (04/05 0521) Temp Source: Oral (04/05 0521) BP: 142/78 mmHg (04/05 1048) Pulse Rate: 72 (04/05 1048)  Labs:  Recent Labs  02/09/16 1455 02/09/16 2024 02/10/16 0208 02/11/16 0352  HGB 12.1  --   --   --   HCT 37.8  --   --   --   PLT 195  --   --   --   APTT 44*  --   --   --   LABPROT 35.9*  --  36.1* 32.4*  INR 3.71*  --  3.74* 3.23*  CREATININE 1.14* 1.02* 1.00 0.99  TROPONINI <0.03 <0.03 <0.03  --     Estimated Creatinine Clearance: 41.5 mL/min (by C-G formula based on Cr of 0.99).   Medical History: Past Medical History  Diagnosis Date  . Essential hypertension   . Erosive esophagitis   . GERD (gastroesophageal reflux disease)   . Hypercholesterolemia   . Coronary atherosclerosis of native coronary artery     a. Nonobstructive minimal CAD 10/2005.  Marland Kitchen Paroxysmal atrial fibrillation (HCC)   . Diastolic dysfunction     Grade 1. Ejection fraction 60-65%.  . Osteoporosis   . PONV (postoperative nausea and vomiting)   . Heart murmur   . History of hiatal hernia   . Migraine     "used to have them right bad; I don't now" (04/11/2015)  . Vertigo   . Arthritis     "right leg" (04/11/2015)  . Anxiety   . Depression     Assessment: 67 yoF on warfarin PTA for afib admitted with afib with RVR x 2 weeks and for Tikosyn initiation. INR 323 today (trend down). She is now off Tikosyn as  it is cost prohibitive  PTA dose: 3mg /day except take 1.5mg  on Tuesdays and Thursdays (last clinic visit 3/22)   Goal of Therapy:  INR 2-3 Monitor platelets by anticoagulation protocol: Yes   Plan:  Hold coumadin  Daily INR and CBC  Hildred Laser, Pharm D 02/11/2016 11:22 AM

## 2016-02-11 NOTE — Care Management Note (Signed)
Case Management Note Marvetta Gibbons RN, BSN Unit 2W-Case Manager [phone]  Patient Details  Name: GLYNN LAATSCH MRN: [id number] Date of Birth: [birth year]  Subjective/Objective:  Pt admitted with afib for Tikosyn load                  Action/Plan: PTA pt lived at home, referral received for Tikosyn medication assistance- insurance check completed for Tikosyn - coverage is -Pt copay will be $497- generic dosetilide available copay $410.35- medication is tier 4 - spoke with pt at bedside regarding cost for drug- pt states that she can not afford this and is very concerned- advised pt to speak with MD when they round in am regarding her options and what other drugs might can be used for her situation. Pt to speak with MD in AM regarding cost of medication - if pt decides to continue with Tikosyn will need a 7 day supply from [place] on discharge- CM to f/u in am with pt regarding plans.   Expected Discharge Date:                  Expected Discharge Plan:  Home/Self Care  In-House Referral:     Discharge planning Services  CM Consult, Medication Assistance  Post Acute Care Choice:    Choice offered to:     DME Arranged:    DME Agency:     HH Arranged:    HH Agency:     Status of Service:  In process, will continue to follow  Medicare Important Message Given:    Date Medicare IM Given:    Medicare IM give by:    Date Additional Medicare IM Given:    Additional Medicare Important Message give by:     If discussed at [place] of Stay Meetings, dates discussed:    Additional Comments:  02/11/16- Marvetta Gibbons RN, BSN- received call from PA with Cards-EP- due to cost of Tikosyn and pt can not afford drug- MD has stopped Tikosyn and is looking into other options for pt- have been asked to look into insurance coverage for Sotalol - Pt copay for 80mg  will be $8.80 fo160mg  copay will be $20-prior auth not required-- this option is a much more affordable option for pt. Per  pt she uses [facility].     Dawayne Patricia, RN 02/11/2016, 12:24 PM

## 2016-02-11 NOTE — Progress Notes (Signed)
The patient is much more comfortable with the co-pay of Sotalol, discussed with pharmacy, recommend 36hours prior to starting after her last Tikosyn dose.  Discussed with the patient, she is agreeable to the plan.  Her Calc Creat Cl today is 47.37, and Sotalol Kamdon Reisig need to be renal dosed.    Tommye Standard, PA-C  Allegra Lai, MD

## 2016-02-12 ENCOUNTER — Ambulatory Visit: Payer: Commercial Managed Care - HMO | Admitting: Family Medicine

## 2016-02-12 LAB — BASIC METABOLIC PANEL
ANION GAP: 11 (ref 5–15)
BUN: 13 mg/dL (ref 6–20)
CHLORIDE: 104 mmol/L (ref 101–111)
CO2: 26 mmol/L (ref 22–32)
Calcium: 8.5 mg/dL — ABNORMAL LOW (ref 8.9–10.3)
Creatinine, Ser: 1.24 mg/dL — ABNORMAL HIGH (ref 0.44–1.00)
GFR calc Af Amer: 47 mL/min — ABNORMAL LOW (ref >60)
GFR, EST NON AFRICAN AMERICAN: 41 mL/min — AB (ref >60)
Glucose, Bld: 91 mg/dL (ref 65–99)
POTASSIUM: 3.8 mmol/L (ref 3.5–5.1)
Sodium: 141 mmol/L (ref 135–145)

## 2016-02-12 LAB — PROTIME-INR
INR: 2.33 — ABNORMAL HIGH (ref 0.00–1.49)
Prothrombin Time: 25.3 seconds — ABNORMAL HIGH (ref 11.6–15.2)

## 2016-02-12 LAB — MAGNESIUM: Magnesium: 2.2 mg/dL (ref 1.7–2.4)

## 2016-02-12 MED ORDER — WARFARIN SODIUM 3 MG PO TABS
3.0000 mg | ORAL_TABLET | Freq: Once | ORAL | Status: AC
  Administered 2016-02-12: 3 mg via ORAL
  Filled 2016-02-12: qty 1

## 2016-02-12 MED ORDER — SOTALOL HCL 80 MG PO TABS
80.0000 mg | ORAL_TABLET | Freq: Every day | ORAL | Status: DC
  Filled 2016-02-12: qty 1

## 2016-02-12 NOTE — Progress Notes (Signed)
J.Claiborne Billings M.D. Paged awaiting return call . EKG done pre Betapace 1st  Dose  QTC .467

## 2016-02-12 NOTE — Progress Notes (Signed)
Brief X-Cover Note  Plan to start sotalol this evening after roughly 36 hour washout of tikosyn. Baseline QTc 467 but visually looked longer. Repeated ECG and QTc 484. Will hold initiation of sotalol tonight given QTc > 450 and defer to primary team to consider in AM after repeat ECG in AM. Also with some mild renal impairment but GFR calculated as 47 today and >60 yesterday.    Jules Husbands, MD

## 2016-02-12 NOTE — Progress Notes (Signed)
Dr. Claiborne Billings return call hold Betapce for now until he reviews  Chart.

## 2016-02-12 NOTE — Progress Notes (Signed)
[place] for warfarin Indication: atrial fibrillation  Allergies  Allergen Reactions  . Crestor [Rosuvastatin Calcium] Other (See Comments)    weakness  . Iodine     ivp dye  . Iohexol     "hot"  . Morphine Nausea Only  . Risedronate Sodium Other (See Comments)    ACTONEL REACTION: reflux  . Triamterene-Hctz Other (See Comments)    weakness  . Atorvastatin Other (See Comments)    Myalgias     Patient Measurements: Height: 5\' 2"  (157.5 cm) Weight: 139 lb 1.8 oz (63.1 kg) IBW/kg (Calculated) : 50.1  Vital Signs: Temp: 98.1 F (36.7 C) (04/06 0426) Temp Source: Oral (04/06 0426) BP: 129/64 mmHg (04/06 1003) Pulse Rate: 72 (04/06 1003)  Labs:  Recent Labs  02/09/16 1455 02/09/16 2024 02/10/16 0208 02/11/16 0352 02/12/16 0246  HGB 12.1  --   --   --   --   HCT 37.8  --   --   --   --   PLT 195  --   --   --   --   APTT 44*  --   --   --   --   LABPROT 35.9*  --  36.1* 32.4* 25.3*  INR 3.71*  --  3.74* 3.23* 2.33*  CREATININE 1.14* 1.02* 1.00 0.99 1.24*  TROPONINI <0.03 <0.03 <0.03  --   --     Estimated Creatinine Clearance: 33.2 mL/min (by C-G formula based on Cr of 1.24).   Medical History: Past Medical History  Diagnosis Date  . Essential hypertension   . Erosive esophagitis   . GERD (gastroesophageal reflux disease)   . Hypercholesterolemia   . Coronary atherosclerosis of native coronary artery     a. Nonobstructive minimal CAD 10/2005.  Marland Kitchen Paroxysmal atrial fibrillation (HCC)   . Diastolic dysfunction     Grade 1. Ejection fraction 60-65%.  . Osteoporosis   . PONV (postoperative nausea and vomiting)   . Heart murmur   . History of hiatal hernia   . Migraine     "used to have them right bad; I don't now" (04/11/2015)  . Vertigo   . Arthritis     "right leg" (04/11/2015)  . Anxiety   . Depression     Assessment: 78 yoF on warfarin PTA for afib admitted with afib with RVR x 2 weeks and Tikosyn has been  stopped. INR 2.33 today (trend down). Patient has been started on sotalol.  PTA dose: 3mg /day except take 1.5mg  on Tuesdays and Thursdays (last clinic visit 3/22)   Goal of Therapy:  INR 2-3 Monitor platelets by anticoagulation protocol: Yes   Plan:  Coumadin 3mg  po today Daily INR and CBC  Hildred Laser, Pharm D 02/12/2016 11:05 AM

## 2016-02-12 NOTE — Progress Notes (Signed)
SUBJECTIVE: The patient is doing well today.  At this time, she denies chest pain, shortness of breath, or any new concerns. She wants to go home.   CURRENT MEDICATIONS: . calcium-vitamin D  2 tablet Oral Q breakfast  . cholecalciferol  1,000 Units Oral Daily  . furosemide  40 mg Oral Daily  . metoprolol tartrate  25 mg Oral BID  . multivitamin with minerals  1 tablet Oral Daily  . pantoprazole  40 mg Oral Daily  . potassium chloride SA  20 mEq Oral Daily  . simvastatin  40 mg Oral QPM  . sodium chloride flush  3 mL Intravenous Q12H  . Warfarin - Pharmacist Dosing Inpatient   Does not apply q1800      OBJECTIVE: Physical Exam: Filed Vitals:   02/11/16 1048 02/11/16 1349 02/11/16 2146 02/12/16 0426  BP: 142/78 104/74 131/73 128/71  Pulse: 72 75 73 67  Temp:  97.7 F (36.5 C) 98 F (36.7 C) 98.1 F (36.7 C)  TempSrc:  Oral Oral Oral  Resp:  18    Height:      Weight:      SpO2:  92% 95% 99%    Intake/Output Summary (Last 24 hours) at 02/12/16 0841 Last data filed at 02/11/16 1700  Gross per 24 hour  Intake    480 ml  Output    400 ml  Net     80 ml    Telemetry reveals sinus rhythm  GEN- The patient is well appearing, alert and oriented x 3 today.   Head- normocephalic, atraumatic Eyes-  Sclera clear, conjunctiva pink Ears- hearing intact Oropharynx- clear Neck- supple  Lungs- Clear to ausculation bilaterally, normal work of breathing Heart- Regular rate and rhythm  GI- soft, NT, ND, + BS Extremities- no clubbing, cyanosis, or edema Skin- no rash or lesion Psych- euthymic mood, full affect Neuro- strength and sensation are intact  LABS: Basic Metabolic Panel:  Recent Labs  02/11/16 0352 02/12/16 0246  NA 142 141  K 3.9 3.8  CL 104 104  CO2 27 26  GLUCOSE 84 91  BUN 13 13  CREATININE 0.99 1.24*  CALCIUM 8.2* 8.5*  MG 2.2 2.2   Liver Function Tests:  Recent Labs  02/09/16 1455  AST 28  ALT 25  ALKPHOS 65  BILITOT 0.3  PROT 6.9    ALBUMIN 3.6   CBC:  Recent Labs  02/09/16 1455  WBC 6.0  NEUTROABS 3.8  HGB 12.1  HCT 37.8  MCV 87.7  PLT 195   Cardiac Enzymes:  Recent Labs  02/09/16 1455 02/09/16 2024 02/10/16 0208  TROPONINI <0.03 <0.03 <0.03   Fasting Lipid Panel:  Recent Labs  02/10/16 0208  CHOL 114  HDL 29*  LDLCALC 52  TRIG 165*  CHOLHDL 3.9    RADIOLOGY: Dg Chest 2 View 02/04/2016  CLINICAL DATA:  Cough and congestion. EXAM: CHEST  2 VIEW COMPARISON:  08/22/2015. FINDINGS: Mediastinum hilar structures normal. Cardiomegaly with normal pulmonary vascularity. Low lung volumes with mild bibasilar atelectasis. Previous identified left pleural effusion has resolved. Diffuse osteopenia and degenerative change.Stable mild thoracic spine compression fractures. IMPRESSION: 1. Cardiomegaly, no pulmonary venous congestion. 2. Low lung volumes with mild bibasilar atelectasis. Previous identified left pleural effusion has resolved. Electronically Signed   By: Marcello Moores  Register   On: 02/04/2016 15:23    ASSESSMENT AND PLAN:  Active Problems:   Atrial fibrillation with RVR (HCC)   A-fib ([place])   1.  Paroxysmal atrial  fibrillation Pt unable to afford Tikosyn She apparently was jittery with amiodarone in the past Norma Barajas try Sotalol Last dose of Tikosyn yesterday morning, Norma Barajas start Sotalol tonight - CrCl around 40, Norma Barajas give Sotalol 80mg  daily EKG prior to first dose to check QTc K and Mg stable Continue Warfarin for CHADS2VASC of 4  2.  HTN Stable No change required today  Hopefully discharge this weekend.   Norma Marshall, NP 02/12/2016 8:49 AM  I have seen and examined this patient with Norma Barajas.  Agree with above, note added to reflect my findings.  On exam, regular rhythm, no murmurs, lungs clear.  Unable to afford tikosyn dosing at $400 per month.  Norma Barajas switch to sotalol to see if it is tolerated.  If not, may need to try low dose amiodarone to see if this Norma Barajas keep her in sinus without  significant side effects.    Norma Barajas M. Oluwaseyi Tull MD 02/12/2016 9:48 AM

## 2016-02-12 NOTE — Care Management Important Message (Signed)
Important Message  Patient Details  Name: APHRODITE WILLEFORD MRN: [id number] Date of Birth: 1941-10-14   Medicare Important Message Given:  Yes    Lizandra Zakrzewski Abena 02/12/2016, 11:04 AM

## 2016-02-13 LAB — BASIC METABOLIC PANEL
Anion gap: 12 (ref 5–15)
BUN: 13 mg/dL (ref 6–20)
CHLORIDE: 99 mmol/L — AB (ref 101–111)
CO2: 27 mmol/L (ref 22–32)
CREATININE: 0.99 mg/dL (ref 0.44–1.00)
Calcium: 8.9 mg/dL (ref 8.9–10.3)
GFR calc non Af Amer: 54 mL/min — ABNORMAL LOW (ref >60)
GLUCOSE: 108 mg/dL — AB (ref 65–99)
Potassium: 3.5 mmol/L (ref 3.5–5.1)
Sodium: 138 mmol/L (ref 135–145)

## 2016-02-13 LAB — PROTIME-INR
INR: 1.59 — ABNORMAL HIGH (ref 0.00–1.49)
PROTHROMBIN TIME: 19 s — AB (ref 11.6–15.2)

## 2016-02-13 LAB — MAGNESIUM: Magnesium: 2.3 mg/dL (ref 1.7–2.4)

## 2016-02-13 MED ORDER — SOTALOL HCL 80 MG PO TABS
80.0000 mg | ORAL_TABLET | Freq: Every day | ORAL | Status: DC
  Administered 2016-02-13 – 2016-02-14 (×2): 80 mg via ORAL
  Filled 2016-02-13 (×2): qty 1

## 2016-02-13 MED ORDER — POTASSIUM CHLORIDE CRYS ER 20 MEQ PO TBCR
40.0000 meq | EXTENDED_RELEASE_TABLET | Freq: Once | ORAL | Status: AC
  Administered 2016-02-13: 40 meq via ORAL
  Filled 2016-02-13: qty 2

## 2016-02-13 MED ORDER — WARFARIN SODIUM 3 MG PO TABS
3.0000 mg | ORAL_TABLET | Freq: Once | ORAL | Status: AC
  Administered 2016-02-13: 3 mg via ORAL
  Filled 2016-02-13: qty 1

## 2016-02-13 MED ORDER — ENOXAPARIN SODIUM 60 MG/0.6ML ~~LOC~~ SOLN
60.0000 mg | Freq: Two times a day (BID) | SUBCUTANEOUS | Status: DC
  Administered 2016-02-13 – 2016-02-14 (×2): 60 mg via SUBCUTANEOUS
  Filled 2016-02-13 (×2): qty 0.6

## 2016-02-13 NOTE — Progress Notes (Addendum)
[place] for warfarin Indication: atrial fibrillation  Allergies  Allergen Reactions  . Crestor [Rosuvastatin Calcium] Other (See Comments)    weakness  . Iodine     ivp dye  . Iohexol     "hot"  . Morphine Nausea Only  . Risedronate Sodium Other (See Comments)    ACTONEL REACTION: reflux  . Triamterene-Hctz Other (See Comments)    weakness  . Atorvastatin Other (See Comments)    Myalgias     Patient Measurements: Height: 5\' 2"  (157.5 cm) Weight: 139 lb 1.8 oz (63.1 kg) IBW/kg (Calculated) : 50.1  Vital Signs: Temp: 98 F (36.7 C) (04/07 0609) Temp Source: Oral (04/07 0609) BP: 120/71 mmHg (04/07 0609) Pulse Rate: 66 (04/07 0609)  Labs:  Recent Labs  02/11/16 0352 02/12/16 0246 02/13/16 0335 02/13/16 0830  LABPROT 32.4* 25.3* 19.0*  --   INR 3.23* 2.33* 1.59*  --   CREATININE 0.99 1.24*  --  0.99    Estimated Creatinine Clearance: 41.5 mL/min (by C-G formula based on Cr of 0.99).   Medical History: Past Medical History  Diagnosis Date  . Essential hypertension   . Erosive esophagitis   . GERD (gastroesophageal reflux disease)   . Hypercholesterolemia   . Coronary atherosclerosis of native coronary artery     a. Nonobstructive minimal CAD 10/2005.  Marland Kitchen Paroxysmal atrial fibrillation (HCC)   . Diastolic dysfunction     Grade 1. Ejection fraction 60-65%.  . Osteoporosis   . PONV (postoperative nausea and vomiting)   . Heart murmur   . History of hiatal hernia   . Migraine     "used to have them right bad; I don't now" (04/11/2015)  . Vertigo   . Arthritis     "right leg" (04/11/2015)  . Anxiety   . Depression     Assessment: 61 yoF on warfarin PTA for afib admitted with afib with RVR x 2 weeks and Tikosyn has been stopped. Coumadin was initially held for INR > 3 and today is 1.59 today (trend down). Patient has been started on sotalol.  PTA dose: 3mg /day except take 1.5mg  on Tuesdays and Thursdays (last clinic  visit 3/22)   Goal of Therapy:  INR 2-3 Monitor platelets by anticoagulation protocol: Yes   Plan:  Coumadin 3mg  po today Daily INR and CBC  Hildred Laser, Pharm D 02/13/2016 10:37 AM   Addendum: Carron Curie  Spoke with Chanetta Marshall: with INR down to 1.59 and recent conversion to NSR will begin bridge with lovenox -SCr= 0.99, CrC; ~ 40  Plan -Lovenox 60mg  Center q12h -CBC every 3 days  Hildred Laser, Pharm D 02/13/2016 11:25 AM

## 2016-02-13 NOTE — Progress Notes (Signed)
Utilization review completed.  

## 2016-02-13 NOTE — Progress Notes (Signed)
SUBJECTIVE: The patient is doing well today.  At this time, she denies chest pain, shortness of breath, or any new concerns.   Sotalol not started last night 2/2 prolonged QT (460-411msec)  CURRENT MEDICATIONS: . calcium-vitamin D  2 tablet Oral Q breakfast  . cholecalciferol  1,000 Units Oral Daily  . furosemide  40 mg Oral Daily  . metoprolol tartrate  25 mg Oral BID  . multivitamin with minerals  1 tablet Oral Daily  . pantoprazole  40 mg Oral Daily  . potassium chloride SA  20 mEq Oral Daily  . simvastatin  40 mg Oral QPM  . sodium chloride flush  3 mL Intravenous Q12H  . sotalol  80 mg Oral Daily  . Warfarin - Pharmacist Dosing Inpatient   Does not apply q1800      OBJECTIVE: Physical Exam: Filed Vitals:   02/12/16 0426 02/12/16 1003 02/12/16 2001 02/13/16 0609  BP: 128/71 129/64 132/67 120/71  Pulse: 67 72 72 66  Temp: 98.1 F (36.7 C)  98.2 F (36.8 C) 98 F (36.7 C)  TempSrc: Oral  Oral Oral  Resp:   18 16  Height:      Weight:      SpO2: 99%  97% 98%    Intake/Output Summary (Last 24 hours) at 02/13/16 0746 Last data filed at 02/12/16 2100  Gross per 24 hour  Intake    843 ml  Output      0 ml  Net    843 ml    Telemetry reveals sinus rhythm, brief run of AF last night   GEN- The patient is well appearing, alert and oriented x 3 today.   Head- normocephalic, atraumatic Eyes-  Sclera clear, conjunctiva pink Ears- hearing intact Oropharynx- clear Neck- supple  Lungs- Clear to ausculation bilaterally, normal work of breathing Heart- Regular rate and rhythm  GI- soft, NT, ND, + BS Extremities- no clubbing, cyanosis, or edema Skin- no rash or lesion Psych- euthymic mood, full affect Neuro- strength and sensation are intact  LABS: Basic Metabolic Panel:  Recent Labs  02/11/16 0352 02/12/16 0246  NA 142 141  K 3.9 3.8  CL 104 104  CO2 27 26  GLUCOSE 84 91  BUN 13 13  CREATININE 0.99 1.24*  CALCIUM 8.2* 8.5*  MG 2.2 2.2     RADIOLOGY: Dg Chest 2 View 02/04/2016  CLINICAL DATA:  Cough and congestion. EXAM: CHEST  2 VIEW COMPARISON:  08/22/2015. FINDINGS: Mediastinum hilar structures normal. Cardiomegaly with normal pulmonary vascularity. Low lung volumes with mild bibasilar atelectasis. Previous identified left pleural effusion has resolved. Diffuse osteopenia and degenerative change.Stable mild thoracic spine compression fractures. IMPRESSION: 1. Cardiomegaly, no pulmonary venous congestion. 2. Low lung volumes with mild bibasilar atelectasis. Previous identified left pleural effusion has resolved. Electronically Signed   By: Marcello Moores  Register   On: 02/04/2016 15:23    ASSESSMENT AND PLAN:  Active Problems:   Atrial fibrillation with RVR (HCC)   A-fib ([place])   1.  Paroxysmal atrial fibrillation Pt unable to afford Tikosyn She apparently was jittery with amiodarone in the past Will try Sotalol this morning - QTc reviewed by Dr Curt Bears and calculated at 467msec CrCl around 40, will give Sotalol 80mg  daily BMET, Mg this morning Follow QTc with EKG after Sotalol Continue Warfarin for CHADS2VASC of 4 - INR subtherapeutic today, but pt in SR, ok to load Sotalol. Pharmacy dosing Coumadin.   2.  HTN Stable No change required today  Hopefully  discharge this weekend.   Chanetta Marshall, NP 02/13/2016 7:46 AM   I have seen and examined this patient with Chanetta Marshall.  Agree with above, note added to reflect my findings.  On exam, regular rhythm, no murmurs, lungs clear.  Not able to afford tikosyn.  Will try sotalol and monitor QTc on initiation of drug.  Went into short run of AF yesterday.  Will continue to monitor.  Will be on sotalol daily.    Will M. Camnitz MD 02/13/2016 12:31 PM

## 2016-02-14 LAB — CBC
HEMATOCRIT: 40.4 % (ref 36.0–46.0)
HEMOGLOBIN: 12.8 g/dL (ref 12.0–15.0)
MCH: 27.6 pg (ref 26.0–34.0)
MCHC: 31.7 g/dL (ref 30.0–36.0)
MCV: 87.3 fL (ref 78.0–100.0)
Platelets: 210 10*3/uL (ref 150–400)
RBC: 4.63 MIL/uL (ref 3.87–5.11)
RDW: 15.1 % (ref 11.5–15.5)
WBC: 6 10*3/uL (ref 4.0–10.5)

## 2016-02-14 LAB — PROTIME-INR
INR: 1.53 — AB (ref 0.00–1.49)
Prothrombin Time: 18.4 seconds — ABNORMAL HIGH (ref 11.6–15.2)

## 2016-02-14 MED ORDER — WARFARIN SODIUM 3 MG PO TABS: 1.5000 mg | ORAL_TABLET | ORAL | Status: DC

## 2016-02-14 MED ORDER — SOTALOL HCL 80 MG PO TABS: 80.0000 mg | ORAL_TABLET | Freq: Every day | ORAL | Status: DC

## 2016-02-14 MED ORDER — WARFARIN SODIUM 3 MG PO TABS: 6.0000 mg | ORAL_TABLET | Freq: Once | ORAL | Status: DC

## 2016-02-14 NOTE — Discharge Summary (Signed)
Physician Discharge Summary       Patient ID: Norma Barajas MRN: [id number] DOB/AGE: 77/11/1940 77 y.o.  Admit date: 02/09/2016 Discharge date: 02/14/2016 Primary Cardiologist:Dr. [name]   Discharge Diagnoses:  Principal Problem:   Atrial fibrillation with RVR [facility]) Active Problems:   Hyperlipidemia   Essential hypertension   Coronary atherosclerosis of native coronary artery   A-fib [facility])   Discharged Condition: good  Procedures: tikosyn load but pt unable to afford. Changed to sotalol.   Hospital Course:  75 year old female was seen in office for nonobstructive CAD, paroxysmal atrial flutter, hypertension, hyperlipidemia and history of anxiety. She had a prior cardiac catheterization in 2006 which showed nonobstructive CAD with 20% left main, 20% LAD, otherwise clean coronary. She had a normal Lexiscan in September 2014. She has a history of intolerance to amiodarone. She was previously admitted in June 2016 for atrial fibrillation with RVR. Echo obtained on 04/11/2015 shows EF 65%, no regional wall motion abnormality, mild LVH. She was started on eliquis and discharged home. She was in the hospital again for recurrent A. fib in October 2016. Dr. [name] has previously discussed with the patient regarding antiarrhythmic treatment to decrease episodes of PAF, however she did not want to try this at the time. Given her age and overall functional status, it was felt she is likely not a good candidate for ablation.  On the visit we found chest pain on 01/29/16 leading to PCP visit and Barajas with afib with RVR.  BB increased.  CXR no pul. Edema.  On visit with our office she was in a fib with RVR at 130.  complained of DOE and chest discomfort.  She was admitted and started on IV dilt drip and INR was therapeutic. She had rec'd dose IV lasix.  She converted to SR on the dilt drip.  And first dose of Tikosyn. Qtc was longer on the 4th.  Later on the 4th Tikosyn decreased to 125 mcg BID.  After cost  discussion pt did not feel she could afford the Tikosyn.    Sotalol was started on the 7th, 36 hours after last dose of Tikosyn.  Pt has tolerated 80 mg daily and we have stopped her Lopressor.  She was seen by Dr. Curt Bears and found stable for discharge with Qtc 480.  She was quite anxious to leave.  I called in her Sotalol to [place].    She Norma Barajas follow up in office with Dr. [name] or APP and Norma Barajas.  Last INR 1.53- she Norma Barajas take 6 mg of Coumadin on Saturday then return to regular dose  She Norma Barajas have an INR check on Monday.    Consults: cardiology  Significant Diagnostic Studies:  BMP Latest Ref Rng 02/13/2016 02/12/2016 02/11/2016  Glucose 65 - 99 mg/dL 108(H) 91 84  BUN 6 - 20 mg/dL 13 13 13   Creatinine 0.44 - 1.00 mg/dL 0.99 1.24(H) 0.99  Sodium 135 - 145 mmol/L 138 141 142  Potassium 3.5 - 5.1 mmol/L 3.5 3.8 3.9  Chloride 101 - 111 mmol/L 99(L) 104 104  CO2 22 - 32 mmol/L 27 26 27   Calcium 8.9 - 10.3 mg/dL 8.9 8.5(L) 8.2(L)   CBC Latest Ref Rng 02/14/2016 02/09/2016 02/04/2016  WBC 4.0 - 10.5 K/uL 6.0 6.0 9.4  Hemoglobin 12.0 - 15.0 g/dL 12.8 12.1 -  Hematocrit 36.0 - 46.0 % 40.4 37.8 39.2  Platelets 150 - 400 K/uL 210 195 204    INR at discharge 1.53 in  SR  BNP 660  Troponin <0.03 X 3  Lipid Panel     Component Value Date/Time   CHOL 114 02/10/2016 0208   CHOL 211* 11/26/2014 0841   CHOL 192 05/15/2013 0829   TRIG 165* 02/10/2016 0208   TRIG 258* 03/19/2014 0812   TRIG 179* 05/15/2013 0829   HDL 29* 02/10/2016 0208   HDL 45 11/26/2014 0841   HDL 45 03/19/2014 0812   HDL 44 05/15/2013 0829   CHOLHDL 3.9 02/10/2016 0208   CHOLHDL 4.7* 11/26/2014 0841   VLDL 33 02/10/2016 0208   LDLCALC 52 02/10/2016 0208   LDLCALC 117* 11/26/2014 0841   LDLCALC 101* 03/19/2014 0812   LDLCALC 112* 05/15/2013 0829      Discharge Exam: Blood pressure 113/61, pulse 71, temperature 98.1 F (36.7 C), temperature source Oral, resp. rate 16, height 5\' 2"  (1.575 m), weight 139 lb  1.8 oz (63.1 kg), SpO2 95 %.  Disposition: 01-Home or Self Care     Medication List    STOP taking these medications        atenolol 50 MG tablet  Commonly known as:  TENORMIN      TAKE these medications        ALPRAZolam 0.25 MG tablet  Commonly known as:  XANAX  TAKE 1 TABLET BY MOUTH TWICE A DAY AS NEEDED FOR ANXIETY, NERVES, OR SLEEP     calcium citrate-vitamin D 315-200 MG-UNIT tablet  Commonly known as:  CITRACAL+D  Take 2 tablets by mouth every morning.     cholecalciferol 1000 units tablet  Commonly known as:  VITAMIN D  Take 1,000 Units by mouth daily.     furosemide 40 MG tablet  Commonly known as:  LASIX  Take 1 tablet (40 mg total) by mouth daily.     multivitamin capsule  Take 1 capsule by mouth every evening.     omeprazole 20 MG capsule  Commonly known as:  PRILOSEC  Take 1 capsule (20 mg total) by mouth daily.     potassium chloride SA 20 MEQ tablet  Commonly known as:  K-DUR,KLOR-CON  Take 1 tablet (20 mEq total) by mouth daily.     simvastatin 40 MG tablet  Commonly known as:  ZOCOR  Take 1 tablet (40 mg total) by mouth every evening.     sotalol 80 MG tablet  Commonly known as:  BETAPACE  Take 1 tablet (80 mg total) by mouth daily.     triamcinolone 0.1 % paste  Commonly known as:  KENALOG  Use as directed 1 application in the mouth or throat 2 (two) times daily.     warfarin 3 MG tablet  Commonly known as:  COUMADIN  Take 0.5-1 tablets (1.5-3 mg total) by mouth See admin instructions. 3 mg on all days except on Tuesday and Thursday patient takes 1.5 mg  For sat the 8th of April take 6 mg then resume previous schedule.       Follow-up Information    Follow up with Norma Rouge, MD.   Specialty:  Cardiology   Why:  the office Norma Barajas call with date and time.   Contact information:   [id number] [place] [address] [phone]        Discharge Instructions: You are now on Sotalol for your atrial fib so no  metoprolol.  Your INR is low so take 6 mg of coumadin tonight 2 of your 3 mg tabs.  Then resume regualar schedule tomorrow.  Have your INR checked (coumadin level) on Monday very important.   Heart Healthy diet.  Out office Harith Mccadden call Monday and schedule a follow up appointment.     Signed: Isaiah Serge Nurse Practitioner-Certified [facility]: HEARTCARE 02/14/2016, 6:18 PM  Time spent on discharge : > 30  minutes.    I have seen and examined this patient with Cecilie Kicks on 02/15/16.  Agree with above, note added to reflect my findings.  On exam, regular rhythm, no murmurs, lungs clear.  Admitted for AF with RVR.  Unable to afford tikosyn so switched to sotalol 80 daily.  Tolerated dosing with QTc of 480 on last check.  Stable today for discharge.  Teo Moede follow up in clinic for ECG and labs.    Lesette Frary M. Frieda Arnall MD 02/15/2016 9:33 AM

## 2016-02-14 NOTE — Discharge Instructions (Signed)
You are now on Sotalol for your atrial fib so no metoprolol.  Your INR is low so take 6 mg of coumadin tonight 2 of your 3 mg tabs.  Then resume regualar schedule tomorrow.  Have your INR checked (coumadin level) on Monday very important.   Heart Healthy diet.  Out office will call Monday and schedule a follow up appointment.

## 2016-02-14 NOTE — Progress Notes (Signed)
SUBJECTIVE: The patient is doing well today.  At this time, she denies chest pain, shortness of breath, or any new concerns.   QTc today 480  CURRENT MEDICATIONS: . calcium-vitamin D  2 tablet Oral Q breakfast  . cholecalciferol  1,000 Units Oral Daily  . enoxaparin (LOVENOX) injection  60 mg Subcutaneous Q12H  . furosemide  40 mg Oral Daily  . metoprolol tartrate  25 mg Oral BID  . multivitamin with minerals  1 tablet Oral Daily  . pantoprazole  40 mg Oral Daily  . potassium chloride SA  20 mEq Oral Daily  . simvastatin  40 mg Oral QPM  . sodium chloride flush  3 mL Intravenous Q12H  . sotalol  80 mg Oral Daily  . Warfarin - Pharmacist Dosing Inpatient   Does not apply q1800      OBJECTIVE: Physical Exam: Filed Vitals:   02/13/16 0609 02/13/16 1304 02/13/16 2127 02/14/16 0455  BP: 120/71 125/69 136/79 137/78  Pulse: 66 72 75 71  Temp: 98 F (36.7 C) 98 F (36.7 C) 97.9 F (36.6 C) 98 F (36.7 C)  TempSrc: Oral Oral Oral Oral  Resp: 16 18 18 18   Height:      Weight:      SpO2: 98% 96% 95% 95%    Intake/Output Summary (Last 24 hours) at 02/14/16 0807 Last data filed at 02/13/16 1900  Gross per 24 hour  Intake    723 ml  Output      0 ml  Net    723 ml    Telemetry reveals sinus rhythm  GEN- The patient is well appearing, alert and oriented x 3 today.   Head- normocephalic, atraumatic Eyes-  Sclera clear, conjunctiva pink Ears- hearing intact Oropharynx- clear Neck- supple  Lungs- Clear to ausculation bilaterally, normal work of breathing Heart- Regular rate and rhythm  GI- soft, NT, ND, + BS Extremities- no clubbing, cyanosis, or edema Skin- no rash or lesion Psych- euthymic mood, full affect Neuro- strength and sensation are intact  LABS: Basic Metabolic Panel:  Recent Labs  02/12/16 0246 02/13/16 0830  NA 141 138  K 3.8 3.5  CL 104 99*  CO2 26 27  GLUCOSE 91 108*  BUN 13 13  CREATININE 1.24* 0.99  CALCIUM 8.5* 8.9  MG 2.2 2.3     RADIOLOGY: Dg Chest 2 View 02/04/2016  CLINICAL DATA:  Cough and congestion. EXAM: CHEST  2 VIEW COMPARISON:  08/22/2015. FINDINGS: Mediastinum hilar structures normal. Cardiomegaly with normal pulmonary vascularity. Low lung volumes with mild bibasilar atelectasis. Previous identified left pleural effusion has resolved. Diffuse osteopenia and degenerative change.Stable mild thoracic spine compression fractures. IMPRESSION: 1. Cardiomegaly, no pulmonary venous congestion. 2. Low lung volumes with mild bibasilar atelectasis. Previous identified left pleural effusion has resolved. Electronically Signed   By: Marcello Moores  Register   On: 02/04/2016 15:23    ASSESSMENT AND PLAN:  Active Problems:   Atrial fibrillation with RVR (HCC)   A-fib ([place])   1.  Paroxysmal atrial fibrillation Pt unable to afford Tikosyn She apparently was jittery with amiodarone in the past Hassaan Crite try Sotalol this morning - QTc reviewed  and calculated at 465msec CrCl around 40, Kamalei Roeder give Sotalol 80mg  daily BMET, Mg this morning Follow QTc with EKG after Sotalol Continue Warfarin for CHADS2VASC of 4 - INR subtherapeutic today, but pt in SR, ok to load Sotalol. Pharmacy dosing Coumadin.  Saylee Sherrill plan for discharge today pending QTc measurement.  2.  HTN Stable  No change required today   Chasitty Hehl M. Brigida Scotti MD 02/14/2016 8:07 AM

## 2016-02-14 NOTE — Progress Notes (Signed)
ANTICOAGULATION CONSULT NOTE - Follow Up Consult  Pharmacy Consult for Coumadin and Lovenox Indication: atrial fibrillation  Allergies  Allergen Reactions  . Crestor [Rosuvastatin Calcium] Other (See Comments)    weakness  . Iodine     ivp dye  . Iohexol     "hot"  . Morphine Nausea Only  . Risedronate Sodium Other (See Comments)    ACTONEL REACTION: reflux  . Triamterene-Hctz Other (See Comments)    weakness  . Atorvastatin Other (See Comments)    Myalgias     Patient Measurements: Height: 5\' 2"  (157.5 cm) Weight: 139 lb 1.8 oz (63.1 kg) IBW/kg (Calculated) : 50.1  Vital Signs: Temp: 98 F (36.7 C) (04/08 0455) Temp Source: Oral (04/08 0455) BP: 137/78 mmHg (04/08 0455) Pulse Rate: 71 (04/08 0455)  Labs:  Recent Labs  02/12/16 0246 02/13/16 0335 02/13/16 0830 02/14/16 0529  HGB  --   --   --  12.8  HCT  --   --   --  40.4  PLT  --   --   --  210  LABPROT 25.3* 19.0*  --  18.4*  INR 2.33* 1.59*  --  1.53*  CREATININE 1.24*  --  0.99  --     Estimated Creatinine Clearance: 41.5 mL/min (by C-G formula based on Cr of 0.99).  Assessment: 75yof continues on coumadin for afib. Lovenox bridge added yesterday due to subtherapeutic INR. INR remains below goal today at 1.53. CBC wnl. No bleeding.  Home dose: 3mg  daily except 1.5mg  on Tues/Thurs  Goal of Therapy:  INR 2-3 Monitor platelets by anticoagulation protocol: Yes     Plan:  1) Increase coumadin to 6mg  today 2) Continue lovenox 60mg  sq q12  **If she goes home today, would recommend 6mg  today and tomorrow then resuming home dose on Monday**  Adilynne Fitzwater Sue 02/14/2016,10:46 AM

## 2016-02-16 ENCOUNTER — Ambulatory Visit (INDEPENDENT_AMBULATORY_CARE_PROVIDER_SITE_OTHER): Payer: Commercial Managed Care - HMO | Admitting: Family Medicine

## 2016-02-16 ENCOUNTER — Encounter: Payer: Self-pay | Admitting: Family Medicine

## 2016-02-16 VITALS — BP 143/84 | HR 76 | Temp 97.2°F | Ht 61.0 in | Wt 138.2 lb

## 2016-02-16 DIAGNOSIS — I10 Essential (primary) hypertension: Secondary | ICD-10-CM

## 2016-02-16 DIAGNOSIS — I482 Chronic atrial fibrillation, unspecified: Secondary | ICD-10-CM

## 2016-02-16 DIAGNOSIS — I480 Paroxysmal atrial fibrillation: Secondary | ICD-10-CM | POA: Diagnosis not present

## 2016-02-16 LAB — COAGUCHEK XS/INR WAIVED
INR: 1.8 — AB (ref 0.9–1.1)
PROTHROMBIN TIME: 21.6 s

## 2016-02-16 MED ORDER — WARFARIN SODIUM 3 MG PO TABS: 1.5000 mg | ORAL_TABLET | ORAL | Status: DC

## 2016-02-16 NOTE — Progress Notes (Signed)
Subjective:  Patient ID: Norma Barajas, female    DOB: 10-15-1941  Age: 75 y.o. MRN: [id number]  CC: Hospitalization Follow-up   HPI Norma Barajas presents for  Patient in for follow-up of atrial fibrillation. Additionally, patient is taking anticoagulants. Patient denies any recent excessive bleeding episodes including epistaxis, bleeding from the gums, genitalia, rectal bleeding or hematuria. Additionally there has been no excessive bruising. Just released from the hospital and needs a Coumadin check based on returning to normal activity and diet as well as a change in medicine from metoprolol to sotalol   follow-up of hypertension. Patient has no history of headache chest pain or shortness of breath or recent cough. Patient also denies symptoms of TIA such as numbness weakness lateralizing. Patient checks  blood pressure at home and has not had any elevated readings recently. Patient denies side effects from his medication. States taking it regularly.    History Norma Barajas has a past medical history of Essential hypertension; Erosive esophagitis; GERD (gastroesophageal reflux disease); Hypercholesterolemia; Coronary atherosclerosis of native coronary artery; Paroxysmal atrial fibrillation ([place]); Diastolic dysfunction; Osteoporosis; PONV (postoperative nausea and vomiting); Heart murmur; History of hiatal hernia; Migraine; Vertigo; Arthritis; Anxiety; and Depression.   She has past surgical history that includes Total abdominal hysterectomy; Prolapsed uterine fibroid ligation (2015); Cataract extraction w/ intraocular lens  implant, bilateral (Bilateral); Open reduction internal fixation (orif) tibia/fibula fracture (Right, 2013); Esophagogastroduodenoscopy (egd) with esophageal dilation (2001); Colonoscopy (2008); Esophagogastroduodenoscopy (egd) with esophageal dilation (11/15/2012); BRAVO ph study (11/15/2012); Flexible sigmoidoscopy (N/A, 08/22/2014); Hemorrhoid banding (N/A, 08/22/2014); Fracture  surgery; Eye surgery (Bilateral); and Dilation and curettage of uterus.   Her family history includes Arthritis in her sister; Cancer in her sister; Cancer - Other in her sister; Colon cancer in her mother; Diabetes in her sister and son; Heart disease in her father and mother; Hip fracture in her mother; Hyperlipidemia in her brother; Hypertension in her brother; Osteoporosis in her mother and sister; Stroke in her brother, sister, and sister. There is no history of Heart attack.She reports that she has never smoked. She has never used smokeless tobacco. She reports that she does not drink alcohol or use illicit drugs.    ROS Review of Systems  Constitutional: Negative for fever, activity change and appetite change.  HENT: Negative for congestion, rhinorrhea and sore throat.   Eyes: Negative for visual disturbance.  Respiratory: Negative for cough and shortness of breath.   Cardiovascular: Negative for chest pain and palpitations.  Gastrointestinal: Negative for nausea, abdominal pain and diarrhea.  Genitourinary: Negative for dysuria.  Musculoskeletal: Negative for myalgias and arthralgias.    Objective:  BP 143/84 mmHg  Pulse 76  Temp(Src) 97.2 F (36.2 C) (Oral)  Ht 5\' 1"  (1.549 m)  Wt 138 lb 3.2 oz (62.687 kg)  BMI 26.13 kg/m2  SpO2 97%  BP Readings from Last 3 Encounters:  02/16/16 143/84  02/14/16 113/61  02/09/16 105/80    Wt Readings from Last 3 Encounters:  02/16/16 138 lb 3.2 oz (62.687 kg)  02/09/16 139 lb 1.8 oz (63.1 kg)  02/09/16 142 lb (64.411 kg)     Physical Exam  Constitutional: She is oriented to person, place, and time. She appears well-developed and well-nourished. No distress.  HENT:  Head: Normocephalic and atraumatic.  Right Ear: External ear normal.  Left Ear: External ear normal.  Nose: Nose normal.  Mouth/Throat: Oropharynx is clear and moist.  Eyes: Conjunctivae and EOM are normal. Pupils are equal, round, and reactive to light.  Neck:  Normal range of motion. Neck supple. No thyromegaly present.  Cardiovascular: Normal rate and normal heart sounds.  An irregularly irregular rhythm present.  Occasional extrasystoles are present.  No murmur heard. Pulmonary/Chest: Effort normal and breath sounds normal. No respiratory distress. She has no wheezes. She has no rales.  Abdominal: Soft. Bowel sounds are normal. She exhibits no distension. There is no tenderness.  Lymphadenopathy:    She has no cervical adenopathy.  Neurological: She is alert and oriented to person, place, and time. She has normal reflexes.  Skin: Skin is warm and dry.  Psychiatric: She has a normal mood and affect. Her behavior is normal. Judgment and thought content normal.     Lab Results  Component Value Date   WBC 6.0 02/14/2016   HGB 12.8 02/14/2016   HCT 40.4 02/14/2016   PLT 210 02/14/2016   GLUCOSE 108* 02/13/2016   CHOL 114 02/10/2016   TRIG 165* 02/10/2016   HDL 29* 02/10/2016   LDLCALC 52 02/10/2016   ALT 25 02/09/2016   AST 28 02/09/2016   NA 138 02/13/2016   K 3.5 02/13/2016   CL 99* 02/13/2016   CREATININE 0.99 02/13/2016   BUN 13 02/13/2016   CO2 27 02/13/2016   TSH 1.483 04/10/2015   INR 1.53* 02/14/2016   HGBA1C 5.6 04/04/2015    No results found.  Assessment & Plan:   Norma Barajas was seen today for hospitalization follow-up.  Diagnoses and all orders for this visit:  Chronic atrial fibrillation (HCC)  Paroxysmal atrial fibrillation (HCC) -     CoaguChek XS/INR Waived  Essential hypertension    Take coumadin 1 1/2 tablets today, then one tomorrow, then resume usual schedule.  I am having Norma Barajas maintain her multivitamin, cholecalciferol, calcium citrate-vitamin D, ALPRAZolam, omeprazole, simvastatin, potassium chloride SA, furosemide, triamcinolone, sotalol, and warfarin.  No orders of the defined types were placed in this encounter.     Follow-up: Return in about 1 week (around 02/23/2016).  [name],  M.D.

## 2016-02-16 NOTE — Addendum Note (Signed)
Addended by: [name] on: 02/16/2016 10:24 AM   Modules accepted: Orders

## 2016-02-16 NOTE — Patient Instructions (Signed)
Take coumadin 1 1/2 tablets today, then one tomorrow, then resume usual schedule.

## 2016-02-23 ENCOUNTER — Other Ambulatory Visit: Payer: Self-pay | Admitting: Family Medicine

## 2016-02-27 ENCOUNTER — Ambulatory Visit (INDEPENDENT_AMBULATORY_CARE_PROVIDER_SITE_OTHER): Payer: Commercial Managed Care - HMO | Admitting: Pharmacist

## 2016-02-27 DIAGNOSIS — I480 Paroxysmal atrial fibrillation: Secondary | ICD-10-CM | POA: Diagnosis not present

## 2016-02-27 LAB — COAGUCHEK XS/INR WAIVED
INR: 2.7 — AB (ref 0.9–1.1)
Prothrombin Time: 32 s

## 2016-02-27 NOTE — Patient Instructions (Signed)
Anticoagulation Dose Instructions as of 02/27/2016      Norma Barajas Tue Wed Thu Fri Sat   New Dose 3 mg 3 mg 1.5 mg 3 mg 1.5 mg 3 mg 3 mg    Description        Continue current warfarin 3mg  dose of 1/2 tablet tuesdays and thursdays and 1 tablet all other days.      INR was 2.7 today

## 2016-03-01 ENCOUNTER — Encounter: Payer: Self-pay | Admitting: Pharmacist

## 2016-03-01 NOTE — Progress Notes (Signed)
Patient ID: PRESSLEY VALENZANO, female   DOB: 01-26-1941, 75 y.o.   MRN: [id number]     Cardiology Office Note   Date:  03/03/2016   ID:  Jalayah, Kaler [birth year], MRN [id number]  PCP:  [name], MD  Cardiologist:  Dr. [name]  No chief complaint on file.     History of Present Illness: AUDRIAUNA EGGENBERGER is a 75 y.o. female who presents for cardiology follow-up. She has PMH of nonobstructive CAD, paroxysmal atrial flutter, hypertension, hyperlipidemia and history of anxiety. She had a prior cardiac catheterization in 2006 which showed nonobstructive CAD with 20% left main, 20% LAD, otherwise clean coronary. She had a normal Lexiscan in September 2014. She has a history of intolerance to amiodarone. She was previously admitted in June 2016 for atrial fibrillation with RVR.  Echo obtained on 04/11/2015 shows EF 65%, no regional wall motion abnormality, mild LVH. She was started on eliquis and discharged home. She was in the hospital again for recurrent A. fib in October 2016. Dr. [name] has previously discussed with the patient regarding antiarrhythmic treatment to decrease episodes of PAF, however she did not want to try this at the time. Given her age and overall functional status, it was felt she is likely not a good candidate for ablation.  She presents today for cardiology office evaluation. Looking back, it appears patient was admitted in last October for atrial fibrillation with RVR, she was converted on IV diltiazem. She was recently seen by her PCP on 01/29/2016 for evaluation of chest pain and found to be in atrial fibrillation with RVR. Her Tenormin was increased to 75 mg daily. She was seen back by her PCP on 02/04/2016, she continued to complain of dyspnea with exertion and chest pain. Her Tenormin was increased to 50 mg twice a day (apparently this was not updated in EPIC). She was also given 40 mg daily of Lasix for sign of fluid overload. Chest x-ray was obtained which was negative for acute  pulmonary edema. CBC and BMET was negative as well. BNP was elevated at 660.5. She presents today for cardiology evaluation. EKG shows atrial fibrillation with RVR with heart rate of 130. She states every time she moves around and do stuff she will have shortness breath with exertion and substernal chest discomfort. This is relieved with rest.  D/C from hospital 02/13/16  Admitted for rapid afib.  Previous intolerance to amiodarone. QT long with Tikosyn and too expensive. Seen by Dr Curt Bears and started on Sotolol 80 bid with d/c QT 480.  Since d/c not feeling well. BP up headache fatigue Thinks its the sotolol making her feel bad.     Past Medical History  Diagnosis Date  . Essential hypertension   . Erosive esophagitis   . GERD (gastroesophageal reflux disease)   . Hypercholesterolemia   . Coronary atherosclerosis of native coronary artery     a. Nonobstructive minimal CAD 10/2005.  Marland Kitchen Paroxysmal atrial fibrillation (HCC)   . Diastolic dysfunction     Grade 1. Ejection fraction 60-65%.  . Osteoporosis   . PONV (postoperative nausea and vomiting)   . Heart murmur   . History of hiatal hernia   . Migraine     "used to have them right bad; I don't now" (04/11/2015)  . Vertigo   . Arthritis     "right leg" (04/11/2015)  . Anxiety   . Depression     Past Surgical History  Procedure Laterality Date  . Total abdominal hysterectomy    .  Prolapsed uterine fibroid ligation  2015  . Cataract extraction w/ intraocular lens  implant, bilateral Bilateral   . Open reduction internal fixation (orif) tibia/fibula fracture Right 2013    broke tibia and fibula after falling down stairs  . Esophagogastroduodenoscopy (egd) with esophageal dilation  2001    Dr. [name]: erosive esophagitis, esophageal stricture, duodenitis, s/p Savary dilation  . Colonoscopy  2008    Dr. [name]: internal hemorrhoids   . Esophagogastroduodenoscopy (egd) with esophageal dilation  11/15/2012    [id number] web was found &  MOST LIKELY CAUSE FOR DYAPHAGIA/Polyp was found in the gastric body and gastric fundus/ gastritis on bx  . Bravo ph study  11/15/2012    Procedure: BRAVO [place] STUDY;  Surgeon: [name], MD;  Location: [facility];  Service: Endoscopy;;  . Flexible sigmoidoscopy N/A 08/22/2014    Procedure: FLEXIBLE SIGMOIDOSCOPY;  Surgeon: [name], MD;  Location: [facility];  Service: Endoscopy;  Laterality: N/A;  830  . Hemorrhoid banding N/A 08/22/2014    Procedure: HEMORRHOID BANDING;  Surgeon: [name], MD;  Location: [facility];  Service: Endoscopy;  Laterality: N/A;  . Fracture surgery    . Eye surgery Bilateral     "laser OR after cataract OR; cause I couldn't see"  . Dilation and curettage of uterus       Current Outpatient Prescriptions  Medication Sig Dispense Refill  . ALPRAZolam (XANAX) 0.25 MG tablet TAKE 1 TABLET BY MOUTH TWICE A DAY AS NEEDED FOR ANXIETY, NERVES, OR SLEEP 60 tablet 3  . calcium citrate-vitamin D (CITRACAL+D) 315-200 MG-UNIT per tablet Take 2 tablets by mouth every morning.    . cholecalciferol (VITAMIN D) 1000 UNITS tablet Take 1,000 Units by mouth daily.    . Multiple Vitamin (MULTIVITAMIN) capsule Take 1 capsule by mouth every evening.    Marland Kitchen omeprazole (PRILOSEC) 20 MG capsule Take 1 capsule (20 mg total) by mouth daily.    . simvastatin (ZOCOR) 40 MG tablet Take 1 tablet (40 mg total) by mouth every evening. 90 tablet 4  . sotalol (BETAPACE) 80 MG tablet Take 1 tablet (80 mg total) by mouth daily. 30 tablet 6  . warfarin (COUMADIN) 3 MG tablet TAKE 1/2 TO 1 TABLET BY MOUTH DAILY AS DIRECTED BY ANTICOAGULATION CLINIC. 90 tablet 1  . amLODipine (NORVASC) 10 MG tablet Take 1 tablet (10 mg total) by mouth daily. 90 tablet 3   No current facility-administered medications for this visit.    Allergies:   Crestor; Iodine; Iohexol; Morphine; Risedronate sodium; Triamterene-hctz; and Atorvastatin    Social History:  The patient  reports that she has never  smoked. She has never used smokeless tobacco. She reports that she does not drink alcohol or use illicit drugs.   Family History:  The patient's family history includes Arthritis in her sister; Cancer in her sister; Cancer - Other in her sister; Colon cancer in her mother; Diabetes in her sister and son; Heart disease in her father and mother; Hip fracture in her mother; Hyperlipidemia in her brother; Hypertension in her brother; Osteoporosis in her mother and sister; Stroke in her brother, sister, and sister. There is no history of Heart attack.    ROS:  Please see the history of present illness.   Otherwise, review of systems are positive for Chest pain with exertion, dyspnea on exertion.   All other systems are reviewed and negative.    PHYSICAL EXAM: VS:  BP 160/90 mmHg  Pulse 94  Ht 5\' 1"  (1.549 m)  Wt 60.782 kg (134 lb)  BMI 25.33 kg/m2  SpO2 97% , BMI Body mass index is 25.33 kg/(m^2). GEN: Well nourished, well developed, in no acute distress HEENT: normal Neck: no carotid bruits, or masses +JVD Cardiac: Irregularly irregular; no murmurs, rubs, or gallops. 1+ pitting edema in bilateral LE Respiratory:  clear to auscultation bilaterally, normal work of breathing GI: soft, nontender, nondistended, + BS MS: no deformity or atrophy Skin: warm and dry, no rash Neuro:  Strength and sensation are intact Psych: euthymic mood, full affect   EKG:  02/14/16  SR rate 69 QT 480   Recent Labs: 04/10/2015: TSH 1.483 02/09/2016: ALT 25; B Natriuretic Peptide 595.8* 02/13/2016: BUN 13; Creatinine, Ser 0.99; Magnesium 2.3; Potassium 3.5; Sodium 138 02/14/2016: Hemoglobin 12.8; Platelets 210    Lipid Panel    Component Value Date/Time   CHOL 114 02/10/2016 0208   CHOL 211* 11/26/2014 0841   CHOL 192 05/15/2013 0829   TRIG 165* 02/10/2016 0208   TRIG 258* 03/19/2014 0812   TRIG 179* 05/15/2013 0829   HDL 29* 02/10/2016 0208   HDL 45 11/26/2014 0841   HDL 45 03/19/2014 0812   HDL 44  05/15/2013 0829   CHOLHDL 3.9 02/10/2016 0208   CHOLHDL 4.7* 11/26/2014 0841   VLDL 33 02/10/2016 0208   LDLCALC 52 02/10/2016 0208   LDLCALC 117* 11/26/2014 0841   LDLCALC 101* 03/19/2014 0812   LDLCALC 112* 05/15/2013 0829      Wt Readings from Last 3 Encounters:  03/03/16 60.782 kg (134 lb)  02/27/16 62.483 kg (137 lb 12 oz)  02/16/16 62.687 kg (138 lb 3.2 oz)      Other studies Reviewed: Additional studies/ records that were reviewed today include:   Echo 04/11/2015 LV EF: 65%  ------------------------------------------------------------------- Indications: Atrial fibrillation - 427.31.  ------------------------------------------------------------------- History: PMH: Murmur. Risk factors: Hypertension. Dyslipidemia.  ------------------------------------------------------------------- Study Conclusions  - Left ventricle: The cavity size was normal. Wall thickness was  increased in a pattern of mild LVH. The estimated ejection  fraction was 65%. Wall motion was normal; there were no regional  wall motion abnormalities. - Right ventricle: The cavity size was normal. Systolic function  was normal.    Cath 11/02/2005 Left main coronary had 20% discrete stenosis.  Left anterior descending artery had 20% multiple discrete stenoses in the mid and distal portion. First and second diagonal branches were normal.  Circumflex coronary artery was normal.  Right coronary artery was dominant and normal.  RAO VENTRICULOGRAPHY: RAO ventriculography showed hyperdynamic LV function. EF was 80%. There was no gradient across the aortic valve. No MR. Aortic pressure was in the 170/84 range. LV pressure was in the 170/17 range.  RAO ventriculography also showed no significant dilatation or dissection of the proximal aortic root.  Hand injection of the right femoral artery showed [place] to be in good position for AngioSeal. A 6-French collagen  plug was deployed in the right femoral artery with good hemostasis.  The patient tolerated the procedure well.  IMPRESSION: Stable probably noncardiac chest pain. No evidence of proximal aortic root disease. Follow-up with Dr. Percival Spanish in three weeks to further assess her blood pressure.    Review of the above records demonstrates:   Previous nonobstructive CAD, admitted last time he in October 2016 with atrial fibrillation with RVR, converted on IV diltiazem. On chronic Coumadin. She was seen by PCP on 01/29/2016 and noted to be in atrial fibrillation with RVR. Despite audible attempt at  increasing her rate control medication, she has not converted out since. She is currently being evaluated in cardiology office for atrial fibrillation with RVR.    ASSESSMENT AND PLAN:  1.  Persistent atrial fibrillation with RVR on coumadin  - She was first noted to be back in A. fib with RVR on 01/29/2016, despite increase in Tenormin, Now on Sotolol with conversion in hospital. Not clear that  She will be able to take long term and has failed amiodarone and tikosyn already Will arrange f/u with Dr Curt Bears as I feel ablation still on table   2. Exertional chest pain with dyspnea  releated to rapid afib improved post cardioversion.  Recent echo with normal EF and no history of CAD cath 2006 And non ischemic myovue 2014    3. Hypertension: Restart norvasc 10 mg   4. Hyperlipidemia: Intolerant to Lipitor and Crestor, currently on Zocor.   Valentina Shaggy

## 2016-03-03 ENCOUNTER — Ambulatory Visit (INDEPENDENT_AMBULATORY_CARE_PROVIDER_SITE_OTHER): Payer: Commercial Managed Care - HMO | Admitting: Cardiovascular Disease

## 2016-03-03 ENCOUNTER — Encounter: Payer: Self-pay | Admitting: Cardiovascular Disease

## 2016-03-03 VITALS — BP 160/90 | HR 94 | Ht 61.0 in | Wt 134.0 lb

## 2016-03-03 DIAGNOSIS — I4891 Unspecified atrial fibrillation: Secondary | ICD-10-CM

## 2016-03-03 MED ORDER — AMLODIPINE BESYLATE 10 MG PO TABS: 10.0000 mg | ORAL_TABLET | Freq: Every day | ORAL | Status: DC

## 2016-03-03 NOTE — Patient Instructions (Signed)
Your physician recommends that you schedule a follow-up appointment with Dr. Curt Bears   Your physician recommends that you schedule a follow-up appointment with Dr. [name] after your appointment with Dr. Curt Bears   Your physician recommends that you continue on your current medications as directed. Please refer to the Current Medication list given to you today.  Start Taking Norvasc 10 mg Daily   If you need a refill on your cardiac medications before your next appointment, please call your pharmacy.  Thank you for choosing [facility]!

## 2016-03-08 NOTE — Progress Notes (Signed)
Electrophysiology Office Note   Date:  03/09/2016   ID:  Aayanna, Mcclennon 1941-04-12, MRN [id number]  PCP:  [name], MD  Cardiologist:  [name] Primary Electrophysiologist:  Constance Haw, MD    Chief Complaint  Patient presents with  . Atrial Fibrillation     History of Present Illness: Norma Barajas is a 75 y.o. female who presents today for electrophysiology evaluation.   She has a history of nonobstructive CAD, paroxysmal atrial flutter, hypertension, hyperlipidemia and history of anxiety.  She had a prior cardiac catheterization in 2006 which showed nonobstructive CAD with 20% left main, 20% LAD, otherwise clean coronary. She had a normal Lexiscan in September 2014. She has a history of intolerance to amiodarone. She was previously admitted in June 2016 for atrial fibrillation with RVR. Echo obtained on 04/11/2015 shows EF 65%, no regional wall motion abnormality, mild LVH. She was started on eliquis and discharged home. She was in the hospital again for recurrent A. fib in October 2016.  She presented to the office in AF with RVR.  She had DOE and chest pain.  She was initially started on Tikosyn but could not afford the drug.  Sotalol was started and tolerated at 80 mg.  Her QTc was 480 msec at discharge.    Today, she denies symptoms of orthopnea, PND, lower extremity edema, claudication, dizziness, presyncope, syncope, bleeding, or neurologic sequela. The patient is tolerating medications without difficulties and is otherwise without complaint today.  She does say that she has some chest pain at times. The pain is worse with exertion and does improve with rest. She gets palpitations when he she gets her chest pain. She says that when she wakes up in the morning she feels well, but when she is out doing her daily activities she starts to feel worse throughout the day.   Past Medical History  Diagnosis Date  . Essential hypertension   . Erosive esophagitis   . GERD  (gastroesophageal reflux disease)   . Hypercholesterolemia   . Coronary atherosclerosis of native coronary artery     a. Nonobstructive minimal CAD 10/2005.  Marland Kitchen Paroxysmal atrial fibrillation (HCC)   . Diastolic dysfunction     Grade 1. Ejection fraction 60-65%.  . Osteoporosis   . PONV (postoperative nausea and vomiting)   . Heart murmur   . History of hiatal hernia   . Migraine     "used to have them right bad; I don't now" (04/11/2015)  . Vertigo   . Arthritis     "right leg" (04/11/2015)  . Anxiety   . Depression    Past Surgical History  Procedure Laterality Date  . Total abdominal hysterectomy    . Prolapsed uterine fibroid ligation  2015  . Cataract extraction w/ intraocular lens  implant, bilateral Bilateral   . Open reduction internal fixation (orif) tibia/fibula fracture Right 2013    broke tibia and fibula after falling down stairs  . Esophagogastroduodenoscopy (egd) with esophageal dilation  2001    Dr. [name]: erosive esophagitis, esophageal stricture, duodenitis, s/p Savary dilation  . Colonoscopy  2008    Dr. [name]: internal hemorrhoids   . Esophagogastroduodenoscopy (egd) with esophageal dilation  11/15/2012    [id number] web was found & MOST LIKELY CAUSE FOR DYAPHAGIA/Polyp was found in the gastric body and gastric fundus/ gastritis on bx  . Bravo ph study  11/15/2012    Procedure: BRAVO [facility];  Surgeon: [name], MD;  Location: [facility];  Service: Endoscopy;;  . Flexible sigmoidoscopy N/A 08/22/2014    Procedure: FLEXIBLE SIGMOIDOSCOPY;  Surgeon: [name], MD;  Location: [facility];  Service: Endoscopy;  Laterality: N/A;  830  . Hemorrhoid banding N/A 08/22/2014    Procedure: HEMORRHOID BANDING;  Surgeon: [name], MD;  Location: [facility];  Service: Endoscopy;  Laterality: N/A;  . Fracture surgery    . Eye surgery Bilateral     "laser OR after cataract OR; cause I couldn't see"  . Dilation and curettage of uterus        Current Outpatient Prescriptions  Medication Sig Dispense Refill  . ALPRAZolam (XANAX) 0.25 MG tablet TAKE 1 TABLET BY MOUTH TWICE A DAY AS NEEDED FOR ANXIETY, NERVES, OR SLEEP 60 tablet 3  . amLODipine (NORVASC) 10 MG tablet Take 1 tablet (10 mg total) by mouth daily. 90 tablet 3  . calcium citrate-vitamin D (CITRACAL+D) 315-200 MG-UNIT per tablet Take 2 tablets by mouth every morning.    . cholecalciferol (VITAMIN D) 1000 UNITS tablet Take 1,000 Units by mouth daily.    . Multiple Vitamin (MULTIVITAMIN) capsule Take 1 capsule by mouth every evening.    Marland Kitchen omeprazole (PRILOSEC) 20 MG capsule Take 1 capsule (20 mg total) by mouth daily.    . simvastatin (ZOCOR) 40 MG tablet Take 1 tablet (40 mg total) by mouth every evening. 90 tablet 4  . sotalol (BETAPACE) 80 MG tablet Take 1 tablet (80 mg total) by mouth daily. 30 tablet 6  . warfarin (COUMADIN) 3 MG tablet TAKE 1/2 TO 1 TABLET BY MOUTH DAILY AS DIRECTED BY ANTICOAGULATION CLINIC. 90 tablet 1   No current facility-administered medications for this visit.    Allergies:   Crestor; Iodine; Iohexol; Morphine; Risedronate sodium; Triamterene-hctz; and Atorvastatin   Social History:  The patient  reports that she has never smoked. She has never used smokeless tobacco. She reports that she does not drink alcohol or use illicit drugs.   Family History:  The patient's family history includes Arthritis in her sister; Cancer in her sister; Cancer - Other in her sister; Colon cancer in her mother; Diabetes in her sister and son; Heart disease in her father and mother; Hip fracture in her mother; Hyperlipidemia in her brother; Hypertension in her brother; Osteoporosis in her mother and sister; Stroke in her brother, sister, and sister. There is no history of Heart attack.    ROS:  Please see the history of present illness.   Otherwise, review of systems is positive for chest pain, palpitations, DOE, anxiety, balance issues, dizziness, headache.    All other systems are reviewed and negative.    PHYSICAL EXAM: VS:  BP 138/86 mmHg  Pulse 80  Ht 5\' 1"  (1.549 m)  Wt 138 lb 12.8 oz (62.959 kg)  BMI 26.24 kg/m2 , BMI Body mass index is 26.24 kg/(m^2). GEN: Well nourished, well developed, in no acute distress HEENT: normal Neck: no JVD, carotid bruits, or masses Cardiac: RRR; no murmurs, rubs, or gallops,no edema  Respiratory:  clear to auscultation bilaterally, normal work of breathing GI: soft, nontender, nondistended, + BS MS: no deformity or atrophy Skin: warm and dry Neuro:  Strength and sensation are intact Psych: euthymic mood, full affect  EKG:  EKG is not ordered today.   Recent Labs: 04/10/2015: TSH 1.483 02/09/2016: ALT 25; B Natriuretic Peptide 595.8* 02/13/2016: BUN 13; Creatinine, Ser 0.99; Magnesium 2.3; Potassium 3.5; Sodium 138 02/14/2016: Hemoglobin 12.8; Platelets 210    Lipid Panel  Component Value Date/Time   CHOL 114 02/10/2016 0208   CHOL 211* 11/26/2014 0841   CHOL 192 05/15/2013 0829   TRIG 165* 02/10/2016 0208   TRIG 258* 03/19/2014 0812   TRIG 179* 05/15/2013 0829   HDL 29* 02/10/2016 0208   HDL 45 11/26/2014 0841   HDL 45 03/19/2014 0812   HDL 44 05/15/2013 0829   CHOLHDL 3.9 02/10/2016 0208   CHOLHDL 4.7* 11/26/2014 0841   VLDL 33 02/10/2016 0208   LDLCALC 52 02/10/2016 0208   LDLCALC 117* 11/26/2014 0841   LDLCALC 101* 03/19/2014 0812   LDLCALC 112* 05/15/2013 0829     Wt Readings from Last 3 Encounters:  03/09/16 138 lb 12.8 oz (62.959 kg)  03/03/16 134 lb (60.782 kg)  02/27/16 137 lb 12 oz (62.483 kg)      Other studies Reviewed: Additional studies/ records that were reviewed today include: TTE 6/16  Review of the above records today demonstrates:   - Left ventricle: The cavity size was normal. Wall thickness was  increased in a pattern of mild LVH. The estimated ejection  fraction was 65%. Wall motion was normal; there were no regional  wall motion abnormalities. -  Right ventricle: The cavity size was normal. Systolic function  was normal.   ASSESSMENT AND PLAN:  1. Atrial fibrillation: initially loaded on Tikosyn but had to switch to sotalol due to cost.  On warfarin anticoagulation.  She is tolerating this without complaint.  She is continuing to have palpitations, and therefore Colby Catanese add carvedilol to her medical regimen.   This patients CHA2DS2-VASc Score and unadjusted Ischemic Stroke Rate (% per year) is equal to 7.2 % stroke rate/year from a score of 5  Above score calculated as 1 point each if present [CHF, HTN, DM, Vascular=MI/PAD/Aortic Plaque, Age if 65-74, or Female] Above score calculated as 2 points each if present [Age > 75, or Stroke/TIA/TE]  2. Chest pain: This time her chest pain is concerning for cardiac causes. Due to that, we'll get a stress test to determine if she has any evidence of ischemia. She may benefit from Imdur were in the future should she have ischemia on her stress test.      Current medicines are reviewed at length with the patient today.   The patient does not have concerns regarding her medicines.  The following changes were made today:  Coreg 6.25 BID  Labs/ tests ordered today include:  No orders of the defined types were placed in this encounter.     Disposition:   FU with Jeneen Doutt 3 months  Signed, Fenton Candee Meredith Leeds, MD  03/09/2016 9:34 AM     [facility] [address] [place] [address] [phone] (office) [phone] (fax)

## 2016-03-09 ENCOUNTER — Ambulatory Visit (INDEPENDENT_AMBULATORY_CARE_PROVIDER_SITE_OTHER): Payer: 59 | Admitting: Cardiology

## 2016-03-09 ENCOUNTER — Encounter: Payer: Self-pay | Admitting: Cardiology

## 2016-03-09 VITALS — BP 138/86 | HR 80 | Ht 61.0 in | Wt 138.8 lb

## 2016-03-09 DIAGNOSIS — I4819 Other persistent atrial fibrillation: Secondary | ICD-10-CM

## 2016-03-09 DIAGNOSIS — I481 Persistent atrial fibrillation: Secondary | ICD-10-CM | POA: Diagnosis not present

## 2016-03-09 MED ORDER — CARVEDILOL 6.25 MG PO TABS: 6.2500 mg | ORAL_TABLET | Freq: Two times a day (BID) | ORAL | Status: DC

## 2016-03-09 NOTE — Patient Instructions (Addendum)
Medication Instructions:  Your physician has recommended you make the following change in your medication:  1) START Carvedilol 6.25 mg twice daily  Labwork: None ordered  Testing/Procedures: Your physician has requested that you have a lexiscan myoview. For further information please visit [email or web address]. Please follow instruction sheet, as given.  (order is already in the system, but has never been scheduled  -- please schedule for the patient.)  Follow-Up: Your physician recommends that you schedule a follow-up appointment in: 3 months with Dr. Curt Bears  If you need a refill on your cardiac medications before your next appointment, please call your pharmacy.  Thank you for choosing [facility]!!   Trinidad Curet, RN [phone]

## 2016-03-11 ENCOUNTER — Ambulatory Visit: Payer: Commercial Managed Care - HMO | Admitting: Family Medicine

## 2016-03-19 ENCOUNTER — Encounter (HOSPITAL_COMMUNITY): Payer: Self-pay

## 2016-03-23 ENCOUNTER — Encounter: Payer: Self-pay | Admitting: Family

## 2016-03-23 ENCOUNTER — Ambulatory Visit (INDEPENDENT_AMBULATORY_CARE_PROVIDER_SITE_OTHER): Payer: Commercial Managed Care - HMO | Admitting: Family

## 2016-03-23 VITALS — BP 135/80 | HR 87 | Temp 96.9°F | Ht 61.0 in | Wt 135.8 lb

## 2016-03-23 DIAGNOSIS — H8110 Benign paroxysmal vertigo, unspecified ear: Secondary | ICD-10-CM | POA: Insufficient documentation

## 2016-03-23 DIAGNOSIS — R42 Dizziness and giddiness: Secondary | ICD-10-CM | POA: Diagnosis not present

## 2016-03-23 DIAGNOSIS — R110 Nausea: Secondary | ICD-10-CM

## 2016-03-23 DIAGNOSIS — H8113 Benign paroxysmal vertigo, bilateral: Secondary | ICD-10-CM

## 2016-03-23 HISTORY — DX: Benign paroxysmal vertigo, unspecified ear: H81.10

## 2016-03-23 LAB — FINGERSTICK HEMOGLOBIN: HEMOGLOBIN: 15.4 g/dL (ref 11.1–15.9)

## 2016-03-23 MED ORDER — MECLIZINE HCL 25 MG PO TABS: 25.0000 mg | ORAL_TABLET | Freq: Three times a day (TID) | ORAL | Status: DC | PRN

## 2016-03-23 MED ORDER — ONDANSETRON HCL 4 MG PO TABS: 4.0000 mg | ORAL_TABLET | Freq: Three times a day (TID) | ORAL | Status: DC | PRN

## 2016-03-23 NOTE — Progress Notes (Signed)
   Subjective:    Patient ID: Norma Barajas, female    DOB: 1941-07-24, 75 y.o.   MRN: [id number]  Emesis  Associated symptoms include dizziness. Pertinent negatives include no chills, coughing or headaches.  Dizziness This is a new problem. The current episode started in the past 7 days. The problem occurs intermittently. The problem has been waxing and waning. Associated symptoms include nausea and vomiting. Pertinent negatives include no chills, congestion, coughing, headaches or sore throat. The symptoms are aggravated by bending and twisting. She has tried rest for the symptoms. The treatment provided no relief.      Review of Systems  Constitutional: Negative for chills.  HENT: Negative for congestion and sore throat.   Respiratory: Negative for cough.   Gastrointestinal: Positive for nausea and vomiting.  Neurological: Positive for dizziness. Negative for headaches.  All other systems reviewed and are negative.      Objective:   Physical Exam  Constitutional: She is oriented to person, place, and time. She appears well-developed and well-nourished. No distress.  HENT:  Head: Normocephalic and atraumatic.  Right Ear: Tympanic membrane is bulging.  Left Ear: Tympanic membrane is bulging.  Nose: Mucosal edema present.  Mouth/Throat: Oropharynx is clear and moist.  Eyes: Pupils are equal, round, and reactive to light.  Neck: Normal range of motion. Neck supple. No thyromegaly present.  Cardiovascular: Normal rate, regular rhythm, normal heart sounds and intact distal pulses.   No murmur heard. Pulmonary/Chest: Effort normal and breath sounds normal. No respiratory distress. She has no wheezes.  Abdominal: Soft. Bowel sounds are normal. She exhibits no distension. There is no tenderness.  Musculoskeletal: Normal range of motion. She exhibits no edema or tenderness.  Neurological: She is alert and oriented to person, place, and time.  Skin: Skin is warm.  Psychiatric: She has a  normal mood and affect. Her behavior is normal. Judgment and thought content normal.  Vitals reviewed.   BP 135/80 mmHg  Pulse 87  Temp(Src) 96.9 F (36.1 C) (Oral)  Ht 5\' 1"  (1.549 m)  Wt 135 lb 12.8 oz (61.598 kg)  BMI 25.67 kg/m2       Assessment & Plan:  1. BPPV (benign paroxysmal positional vertigo), bilateral -Falls precaution discussed -Zofran given for when pt becomes dizziness -Handout given to patient to do Epley exercises at home -RTO prn - Fingerstick Hemoglobin - meclizine (ANTIVERT) 25 MG tablet; Take 1 tablet (25 mg total) by mouth 3 (three) times daily as needed for dizziness.  Dispense: 30 tablet; Refill: 0  2. Nausea - ondansetron (ZOFRAN) 4 MG tablet; Take 1 tablet (4 mg total) by mouth every 8 (eight) hours as needed for nausea or vomiting.  Dispense: 20 tablet; Refill: 0 - meclizine (ANTIVERT) 25 MG tablet; Take 1 tablet (25 mg total) by mouth 3 (three) times daily as needed for dizziness.  Dispense: 30 tablet; Refill: 0  3. Dizziness - Fingerstick Hemoglobin - meclizine (ANTIVERT) 25 MG tablet; Take 1 tablet (25 mg total) by mouth 3 (three) times daily as needed for dizziness.  Dispense: 30 tablet; Refill: 0  Evelina Dun, FNP

## 2016-03-23 NOTE — Patient Instructions (Signed)
Benign Positional Vertigo Vertigo is the feeling that you or your surroundings are moving when they are not. Benign positional vertigo is the most common form of vertigo. The cause of this condition is not serious (is benign). This condition is triggered by certain movements and positions (is positional). This condition can be dangerous if it occurs while you are doing something that could endanger you or others, such as driving.  CAUSES In many cases, the cause of this condition is not known. It may be caused by a disturbance in an area of the inner ear that helps your brain to sense movement and balance. This disturbance can be caused by a viral infection (labyrinthitis), head injury, or repetitive motion. RISK FACTORS This condition is more likely to develop in:  Women.  People who are 50 years of age or older. SYMPTOMS Symptoms of this condition usually happen when you move your head or your eyes in different directions. Symptoms may start suddenly, and they usually last for less than a minute. Symptoms may include:  Loss of balance and falling.  Feeling like you are spinning or moving.  Feeling like your surroundings are spinning or moving.  Nausea and vomiting.  Blurred vision.  Dizziness.  Involuntary eye movement (nystagmus). Symptoms can be mild and cause only slight annoyance, or they can be severe and interfere with daily life. Episodes of benign positional vertigo may return (recur) over time, and they may be triggered by certain movements. Symptoms may improve over time. DIAGNOSIS This condition is usually diagnosed by medical history and a physical exam of the head, neck, and ears. You may be referred to a health care provider who specializes in ear, nose, and throat (ENT) problems (otolaryngologist) or a provider who specializes in disorders of the nervous system (neurologist). You may have additional testing, including:  MRI.  A CT scan.  Eye movement tests. Your  health care provider may ask you to change positions quickly while he or she watches you for symptoms of benign positional vertigo, such as nystagmus. Eye movement may be tested with an electronystagmogram (ENG), caloric stimulation, the Dix-Hallpike test, or the roll test.  An electroencephalogram (EEG). This records electrical activity in your brain.  Hearing tests. TREATMENT Usually, your health care provider will treat this by moving your head in specific positions to adjust your inner ear back to normal. Surgery may be needed in severe cases, but this is rare. In some cases, benign positional vertigo may resolve on its own in 2-4 weeks. HOME CARE INSTRUCTIONS Safety  Move slowly.Avoid sudden body or head movements.  Avoid driving.  Avoid operating heavy machinery.  Avoid doing any tasks that would be dangerous to you or others if a vertigo episode would occur.  If you have trouble walking or keeping your balance, try using a cane for stability. If you feel dizzy or unstable, sit down right away.  Return to your normal activities as told by your health care provider. Ask your health care provider what activities are safe for you. General Instructions  Take over-the-counter and prescription medicines only as told by your health care provider.  Avoid certain positions or movements as told by your health care provider.  Drink enough fluid to keep your urine clear or pale yellow.  Keep all follow-up visits as told by your health care provider. This is important. SEEK MEDICAL CARE IF:  You have a fever.  Your condition gets worse or you develop new symptoms.  Your family or friends   notice any behavioral changes.  Your nausea or vomiting gets worse.  You have numbness or a "pins and needles" sensation. SEEK IMMEDIATE MEDICAL CARE IF:  You have difficulty speaking or moving.  You are always dizzy.  You faint.  You develop severe headaches.  You have weakness in your  legs or arms.  You have changes in your hearing or vision.  You develop a stiff neck.  You develop sensitivity to light.   This information is not intended to replace advice given to you by your health care provider. Make sure you discuss any questions you have with your health care provider.   Document Released: 08/02/2006 Document Revised: 07/16/2015 Document Reviewed: 02/17/2015 Elsevier Interactive Patient Education 2016 [employer]  

## 2016-03-26 ENCOUNTER — Ambulatory Visit (INDEPENDENT_AMBULATORY_CARE_PROVIDER_SITE_OTHER): Payer: Commercial Managed Care - HMO | Admitting: Pharmacist

## 2016-03-26 DIAGNOSIS — I480 Paroxysmal atrial fibrillation: Secondary | ICD-10-CM | POA: Diagnosis not present

## 2016-03-26 LAB — COAGUCHEK XS/INR WAIVED
INR: 2.7 — AB (ref 0.9–1.1)
Prothrombin Time: 32.8 s

## 2016-03-26 NOTE — Patient Instructions (Signed)
Anticoagulation Dose Instructions as of 03/26/2016      Norma Barajas Tue Wed Thu Fri Sat   New Dose 3 mg 3 mg 1.5 mg 3 mg 1.5 mg 3 mg 3 mg    Description        Continue current warfarin 3mg  dose of 1/2 tablet tuesdays and thursdays and 1 tablet all other days.  Hemoglobin checked today in lab was 13.9mg /dL     INR was 2.7 today

## 2016-04-01 ENCOUNTER — Telehealth: Payer: Self-pay | Admitting: Cardiovascular Disease

## 2016-04-01 NOTE — Telephone Encounter (Signed)
Patient would like to speak with nurse regarding medication side effects./ tg

## 2016-04-01 NOTE — Telephone Encounter (Signed)
Called pt, she stated that the coreg and betapace that she is taking is making her feel terrible. ( diarrhea, constant nausea, dizziness and fatigue) She has tried her best to work through the side effects but it has not gotten any better. She was wanting to know if there was anything else she could try. I sent Dr. [name] a staff message with pt's concerns and I also told her she could call the [facility] office to see if she could get his nurse there to help her any sooner than I could as he is working there most days. She stated she has an appointment with him in June here at the [facility] office.

## 2016-04-11 ENCOUNTER — Emergency Department (HOSPITAL_COMMUNITY): Payer: Commercial Managed Care - HMO

## 2016-04-11 ENCOUNTER — Encounter (HOSPITAL_COMMUNITY): Payer: Self-pay

## 2016-04-11 ENCOUNTER — Emergency Department (HOSPITAL_COMMUNITY)
Admission: EM | Admit: 2016-04-11 | Discharge: 2016-04-11 | Disposition: A | Discharge status: Home / Self Care | Payer: Commercial Managed Care - HMO | Attending: Emergency Medicine | Admitting: Emergency Medicine

## 2016-04-11 DIAGNOSIS — Y92410 Unspecified street and highway as the place of occurrence of the external cause: Secondary | ICD-10-CM | POA: Diagnosis not present

## 2016-04-11 DIAGNOSIS — M810 Age-related osteoporosis without current pathological fracture: Secondary | ICD-10-CM | POA: Insufficient documentation

## 2016-04-11 DIAGNOSIS — F329 Major depressive disorder, single episode, unspecified: Secondary | ICD-10-CM | POA: Insufficient documentation

## 2016-04-11 DIAGNOSIS — Y939 Activity, unspecified: Secondary | ICD-10-CM | POA: Insufficient documentation

## 2016-04-11 DIAGNOSIS — S299XXA Unspecified injury of thorax, initial encounter: Secondary | ICD-10-CM | POA: Diagnosis not present

## 2016-04-11 DIAGNOSIS — I480 Paroxysmal atrial fibrillation: Secondary | ICD-10-CM | POA: Diagnosis not present

## 2016-04-11 DIAGNOSIS — I10 Essential (primary) hypertension: Secondary | ICD-10-CM | POA: Insufficient documentation

## 2016-04-11 DIAGNOSIS — R0789 Other chest pain: Secondary | ICD-10-CM | POA: Diagnosis not present

## 2016-04-11 DIAGNOSIS — I2510 Atherosclerotic heart disease of native coronary artery without angina pectoris: Secondary | ICD-10-CM | POA: Insufficient documentation

## 2016-04-11 DIAGNOSIS — M1990 Unspecified osteoarthritis, unspecified site: Secondary | ICD-10-CM | POA: Diagnosis not present

## 2016-04-11 DIAGNOSIS — Z79899 Other long term (current) drug therapy: Secondary | ICD-10-CM | POA: Insufficient documentation

## 2016-04-11 DIAGNOSIS — T07 Unspecified multiple injuries: Secondary | ICD-10-CM | POA: Diagnosis not present

## 2016-04-11 DIAGNOSIS — S2222XA Fracture of body of sternum, initial encounter for closed fracture: Secondary | ICD-10-CM | POA: Diagnosis not present

## 2016-04-11 DIAGNOSIS — Y999 Unspecified external cause status: Secondary | ICD-10-CM | POA: Diagnosis not present

## 2016-04-11 DIAGNOSIS — S2220XA Unspecified fracture of sternum, initial encounter for closed fracture: Secondary | ICD-10-CM | POA: Insufficient documentation

## 2016-04-11 DIAGNOSIS — S279XXA Injury of unspecified intrathoracic organ, initial encounter: Secondary | ICD-10-CM | POA: Diagnosis not present

## 2016-04-11 DIAGNOSIS — R072 Precordial pain: Secondary | ICD-10-CM | POA: Diagnosis not present

## 2016-04-11 MED ORDER — DOCUSATE SODIUM 100 MG PO CAPS: 100.0000 mg | ORAL_CAPSULE | Freq: Two times a day (BID) | ORAL | Status: DC

## 2016-04-11 MED ORDER — HYDROCODONE-ACETAMINOPHEN 5-325 MG PO TABS: 2.0000 | ORAL_TABLET | ORAL | Status: DC | PRN

## 2016-04-11 MED ORDER — HYDROCODONE-ACETAMINOPHEN 5-325 MG PO TABS
2.0000 | ORAL_TABLET | Freq: Once | ORAL | Status: AC
  Administered 2016-04-11: 2 via ORAL
  Filled 2016-04-11: qty 2
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
## 2016-04-11 NOTE — Discharge Instructions (Signed)
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
## 2016-04-13 ENCOUNTER — Ambulatory Visit (INDEPENDENT_AMBULATORY_CARE_PROVIDER_SITE_OTHER): Payer: Commercial Managed Care - HMO | Admitting: Family Medicine

## 2016-04-13 ENCOUNTER — Encounter: Payer: Self-pay | Admitting: Family Medicine

## 2016-04-13 ENCOUNTER — Telehealth: Payer: Self-pay | Admitting: *Deleted

## 2016-04-13 ENCOUNTER — Ambulatory Visit (HOSPITAL_COMMUNITY)
Admission: RE | Admit: 2016-04-13 | Discharge: 2016-04-13 | Disposition: A | Discharge status: Home / Self Care | Payer: Commercial Managed Care - HMO | Source: Ambulatory Visit | Attending: Family Medicine | Admitting: Family Medicine

## 2016-04-13 VITALS — BP 149/85 | HR 68 | Temp 97.8°F | Ht 61.0 in | Wt 135.0 lb

## 2016-04-13 DIAGNOSIS — R1110 Vomiting, unspecified: Secondary | ICD-10-CM | POA: Diagnosis not present

## 2016-04-13 DIAGNOSIS — R110 Nausea: Secondary | ICD-10-CM

## 2016-04-13 DIAGNOSIS — S0990XA Unspecified injury of head, initial encounter: Secondary | ICD-10-CM | POA: Diagnosis not present

## 2016-04-13 DIAGNOSIS — S098XXA Other specified injuries of head, initial encounter: Secondary | ICD-10-CM

## 2016-04-13 MED ORDER — PROMETHAZINE HCL 25 MG/ML IJ SOLN
50.0000 mg | Freq: Once | INTRAMUSCULAR | Status: AC
  Administered 2016-04-13: 50 mg via INTRAMUSCULAR

## 2016-04-13 MED ORDER — TAPENTADOL HCL 50 MG PO TABS: 50.0000 mg | ORAL_TABLET | ORAL | Status: DC | PRN

## 2016-04-13 MED ORDER — ONDANSETRON HCL 4 MG PO TABS
4.0000 mg | ORAL_TABLET | Freq: Three times a day (TID) | ORAL | Status: DC | PRN
Start: 2016-04-13 — End: 2016-04-20

## 2016-04-13 NOTE — Telephone Encounter (Signed)
-----   Message from [name], MD sent at 04/13/2016  2:46 PM EDT ----- Please contact the patient regarding:CT haed - no internal bleeding or other abnormality noted. WS

## 2016-04-13 NOTE — Telephone Encounter (Signed)
Pt notified of results Verbalizes understanding 
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
## 2016-04-13 NOTE — Progress Notes (Signed)
Quick Note:  Please contact the patient regarding:CT haed - no internal bleeding or other abnormality noted. WS ______

## 2016-04-14 ENCOUNTER — Telehealth: Payer: Self-pay | Admitting: Family Medicine

## 2016-04-14 ENCOUNTER — Other Ambulatory Visit: Payer: Self-pay | Admitting: Family Medicine

## 2016-04-14 ENCOUNTER — Ambulatory Visit: Payer: 59 | Admitting: Cardiology

## 2016-04-14 MED ORDER — OXYCODONE HCL 10 MG PO TABS: 10.0000 mg | ORAL_TABLET | Freq: Four times a day (QID) | ORAL | Status: DC | PRN

## 2016-04-14 NOTE — Telephone Encounter (Signed)
The prescription was for nucynta, a level 2 controlled opiate. They will need to bring the prescription in to trade it out. (I discussed this in detail with them at time of visit.) Scrip written fro oxycodone as a substitute

## 2016-04-14 NOTE — Telephone Encounter (Signed)
Pt aware of recommendations of how to get medications changed.

## 2016-04-15 ENCOUNTER — Observation Stay (HOSPITAL_COMMUNITY)
Admission: EM | Admit: 2016-04-15 | Discharge: 2016-04-16 | Disposition: A | Discharge status: Home / Self Care | Payer: Commercial Managed Care - HMO | Attending: Internal Medicine | Admitting: Internal Medicine

## 2016-04-15 ENCOUNTER — Emergency Department (HOSPITAL_COMMUNITY): Payer: Commercial Managed Care - HMO

## 2016-04-15 ENCOUNTER — Encounter (HOSPITAL_COMMUNITY): Payer: Self-pay

## 2016-04-15 DIAGNOSIS — I10 Essential (primary) hypertension: Secondary | ICD-10-CM | POA: Diagnosis not present

## 2016-04-15 DIAGNOSIS — R0902 Hypoxemia: Secondary | ICD-10-CM | POA: Diagnosis not present

## 2016-04-15 DIAGNOSIS — Z79899 Other long term (current) drug therapy: Secondary | ICD-10-CM | POA: Diagnosis not present

## 2016-04-15 DIAGNOSIS — R1030 Lower abdominal pain, unspecified: Secondary | ICD-10-CM | POA: Diagnosis present

## 2016-04-15 DIAGNOSIS — I4891 Unspecified atrial fibrillation: Secondary | ICD-10-CM | POA: Diagnosis present

## 2016-04-15 DIAGNOSIS — I480 Paroxysmal atrial fibrillation: Secondary | ICD-10-CM | POA: Diagnosis not present

## 2016-04-15 DIAGNOSIS — S2220XD Unspecified fracture of sternum, subsequent encounter for fracture with routine healing: Principal | ICD-10-CM | POA: Insufficient documentation

## 2016-04-15 DIAGNOSIS — M1990 Unspecified osteoarthritis, unspecified site: Secondary | ICD-10-CM | POA: Insufficient documentation

## 2016-04-15 DIAGNOSIS — S2242XS Multiple fractures of ribs, left side, sequela: Secondary | ICD-10-CM | POA: Diagnosis not present

## 2016-04-15 DIAGNOSIS — F329 Major depressive disorder, single episode, unspecified: Secondary | ICD-10-CM | POA: Diagnosis not present

## 2016-04-15 DIAGNOSIS — S3991XA Unspecified injury of abdomen, initial encounter: Secondary | ICD-10-CM | POA: Diagnosis not present

## 2016-04-15 DIAGNOSIS — I2510 Atherosclerotic heart disease of native coronary artery without angina pectoris: Secondary | ICD-10-CM | POA: Diagnosis not present

## 2016-04-15 DIAGNOSIS — I482 Chronic atrial fibrillation, unspecified: Secondary | ICD-10-CM | POA: Diagnosis present

## 2016-04-15 DIAGNOSIS — S2243XS Multiple fractures of ribs, bilateral, sequela: Secondary | ICD-10-CM

## 2016-04-15 DIAGNOSIS — F411 Generalized anxiety disorder: Secondary | ICD-10-CM | POA: Diagnosis present

## 2016-04-15 DIAGNOSIS — Z7901 Long term (current) use of anticoagulants: Secondary | ICD-10-CM | POA: Insufficient documentation

## 2016-04-15 DIAGNOSIS — S2241XS Multiple fractures of ribs, right side, sequela: Secondary | ICD-10-CM | POA: Diagnosis not present

## 2016-04-15 DIAGNOSIS — S2222XA Fracture of body of sternum, initial encounter for closed fracture: Secondary | ICD-10-CM | POA: Diagnosis not present

## 2016-04-15 MED ORDER — POLYETHYLENE GLYCOL 3350 17 G PO PACK
17.0000 g | PACK | Freq: Once | ORAL | Status: AC
  Administered 2016-04-15: 17 g via ORAL
  Filled 2016-04-15: qty 1

## 2016-04-15 MED ORDER — ONDANSETRON HCL 4 MG/2ML IJ SOLN
4.0000 mg | Freq: Once | INTRAMUSCULAR | Status: AC
  Administered 2016-04-16: 4 mg via INTRAVENOUS
  Filled 2016-04-15: qty 2

## 2016-04-15 MED ORDER — SODIUM CHLORIDE 0.9 % IV BOLUS (SEPSIS)
1000.0000 mL | Freq: Once | INTRAVENOUS | Status: AC
  Administered 2016-04-16: 1000 mL via INTRAVENOUS

## 2016-04-15 MED ORDER — DOCUSATE SODIUM 100 MG PO CAPS
100.0000 mg | ORAL_CAPSULE | Freq: Once | ORAL | Status: AC
  Administered 2016-04-16: 100 mg via ORAL
  Filled 2016-04-15 (×2): qty 1

## 2016-04-15 MED ORDER — FENTANYL CITRATE (PF) 100 MCG/2ML IJ SOLN
50.0000 ug | Freq: Once | INTRAMUSCULAR | Status: AC
  Administered 2016-04-16: 50 ug via INTRAVENOUS
  Filled 2016-04-15: qty 2
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
## 2016-04-16 ENCOUNTER — Encounter (HOSPITAL_COMMUNITY): Payer: Self-pay

## 2016-04-16 DIAGNOSIS — I480 Paroxysmal atrial fibrillation: Secondary | ICD-10-CM

## 2016-04-16 DIAGNOSIS — I482 Chronic atrial fibrillation: Secondary | ICD-10-CM

## 2016-04-16 DIAGNOSIS — R0902 Hypoxemia: Secondary | ICD-10-CM | POA: Diagnosis present

## 2016-04-16 DIAGNOSIS — S2243XS Multiple fractures of ribs, bilateral, sequela: Secondary | ICD-10-CM

## 2016-04-16 DIAGNOSIS — S2220XD Unspecified fracture of sternum, subsequent encounter for fracture with routine healing: Secondary | ICD-10-CM

## 2016-04-16 DIAGNOSIS — S2220XA Unspecified fracture of sternum, initial encounter for closed fracture: Secondary | ICD-10-CM | POA: Insufficient documentation

## 2016-04-16 DIAGNOSIS — S3991XA Unspecified injury of abdomen, initial encounter: Secondary | ICD-10-CM | POA: Diagnosis not present

## 2016-04-16 DIAGNOSIS — S2222XA Fracture of body of sternum, initial encounter for closed fracture: Secondary | ICD-10-CM | POA: Diagnosis not present

## 2016-04-16 LAB — CBC WITH DIFFERENTIAL/PLATELET
BASOS PCT: 0 %
Basophils Absolute: 0 10*3/uL (ref 0.0–0.1)
Eosinophils Absolute: 0.1 10*3/uL (ref 0.0–0.7)
Eosinophils Relative: 1 %
HEMATOCRIT: 37.8 % (ref 36.0–46.0)
Hemoglobin: 12.6 g/dL (ref 12.0–15.0)
LYMPHS PCT: 16 %
Lymphs Abs: 1.5 10*3/uL (ref 0.7–4.0)
MCH: 28.8 pg (ref 26.0–34.0)
MCHC: 33.3 g/dL (ref 30.0–36.0)
MCV: 86.5 fL (ref 78.0–100.0)
MONO ABS: 0.9 10*3/uL (ref 0.1–1.0)
MONOS PCT: 9 %
NEUTROS ABS: 6.9 10*3/uL (ref 1.7–7.7)
Neutrophils Relative %: 74 %
Platelets: 202 10*3/uL (ref 150–400)
RBC: 4.37 MIL/uL (ref 3.87–5.11)
RDW: 15.4 % (ref 11.5–15.5)
WBC: 9.4 10*3/uL (ref 4.0–10.5)

## 2016-04-16 LAB — COMPREHENSIVE METABOLIC PANEL
ALT: 15 U/L (ref 14–54)
ANION GAP: 9 (ref 5–15)
AST: 22 U/L (ref 15–41)
Albumin: 3.8 g/dL (ref 3.5–5.0)
Alkaline Phosphatase: 61 U/L (ref 38–126)
BILIRUBIN TOTAL: 0.6 mg/dL (ref 0.3–1.2)
BUN: 14 mg/dL (ref 6–20)
CO2: 27 mmol/L (ref 22–32)
Calcium: 8.7 mg/dL — ABNORMAL LOW (ref 8.9–10.3)
Chloride: 100 mmol/L — ABNORMAL LOW (ref 101–111)
Creatinine, Ser: 0.9 mg/dL (ref 0.44–1.00)
Glucose, Bld: 136 mg/dL — ABNORMAL HIGH (ref 65–99)
POTASSIUM: 3.6 mmol/L (ref 3.5–5.1)
Sodium: 136 mmol/L (ref 135–145)
TOTAL PROTEIN: 7.7 g/dL (ref 6.5–8.1)

## 2016-04-16 LAB — URINALYSIS, ROUTINE W REFLEX MICROSCOPIC
BILIRUBIN URINE: NEGATIVE
Glucose, UA: NEGATIVE mg/dL
KETONES UR: NEGATIVE mg/dL
NITRITE: NEGATIVE
Protein, ur: NEGATIVE mg/dL
Specific Gravity, Urine: <1.005 — ABNORMAL LOW (ref 1.005–1.030)
pH: 6 (ref 5.0–8.0)

## 2016-04-16 LAB — PROTIME-INR
INR: 3.06 — AB (ref 0.00–1.49)
INR: 3.3 — AB (ref 0.00–1.49)
PROTHROMBIN TIME: 31 s — AB (ref 11.6–15.2)
Prothrombin Time: 32.9 seconds — ABNORMAL HIGH (ref 11.6–15.2)

## 2016-04-16 LAB — URINE MICROSCOPIC-ADD ON

## 2016-04-16 LAB — SAMPLE TO BLOOD BANK

## 2016-04-16 LAB — LIPASE, BLOOD: LIPASE: 26 U/L (ref 11–51)

## 2016-04-16 LAB — APTT: APTT: 55 s — AB (ref 24–37)

## 2016-04-16 LAB — TROPONIN I: Troponin I: <0.03 ng/mL (ref <0.031)

## 2016-04-16 MED ORDER — DOCUSATE SODIUM 100 MG PO CAPS
100.0000 mg | ORAL_CAPSULE | Freq: Two times a day (BID) | ORAL | Status: DC
  Filled 2016-04-16: qty 1

## 2016-04-16 MED ORDER — DOCUSATE SODIUM 100 MG PO CAPS
100.0000 mg | ORAL_CAPSULE | Freq: Two times a day (BID) | ORAL | Status: DC
  Administered 2016-04-16 (×2): 100 mg via ORAL
  Filled 2016-04-16 (×3): qty 1

## 2016-04-16 MED ORDER — ADULT MULTIVITAMIN W/MINERALS CH: 1.0000 | ORAL_TABLET | Freq: Every evening | ORAL | Status: DC

## 2016-04-16 MED ORDER — CARVEDILOL 12.5 MG PO TABS
6.2500 mg | ORAL_TABLET | Freq: Two times a day (BID) | ORAL | Status: DC
  Administered 2016-04-16 (×2): 6.25 mg via ORAL
  Filled 2016-04-16 (×2): qty 1

## 2016-04-16 MED ORDER — FENTANYL CITRATE (PF) 100 MCG/2ML IJ SOLN
50.0000 ug | Freq: Once | INTRAMUSCULAR | Status: AC
  Administered 2016-04-16: 50 ug via INTRAVENOUS
  Filled 2016-04-16: qty 2

## 2016-04-16 MED ORDER — IOPAMIDOL (ISOVUE-300) INJECTION 61%
100.0000 mL | Freq: Once | INTRAVENOUS | Status: AC | PRN
  Administered 2016-04-16: 100 mL via INTRAVENOUS

## 2016-04-16 MED ORDER — AMLODIPINE BESYLATE 5 MG PO TABS
10.0000 mg | ORAL_TABLET | Freq: Every day | ORAL | Status: DC
  Administered 2016-04-16: 10 mg via ORAL
  Filled 2016-04-16: qty 2

## 2016-04-16 MED ORDER — VITAMIN D 1000 UNITS PO TABS
1000.0000 [IU] | ORAL_TABLET | Freq: Every day | ORAL | Status: DC
  Administered 2016-04-16: 1000 [IU] via ORAL
  Filled 2016-04-16 (×2): qty 1

## 2016-04-16 MED ORDER — ONDANSETRON HCL 4 MG/2ML IJ SOLN: 4.0000 mg | Freq: Three times a day (TID) | INTRAMUSCULAR | Status: DC | PRN

## 2016-04-16 MED ORDER — FENTANYL CITRATE (PF) 100 MCG/2ML IJ SOLN: 50.0000 ug | INTRAMUSCULAR | Status: DC | PRN

## 2016-04-16 MED ORDER — SODIUM CHLORIDE 0.9% FLUSH: 3.0000 mL | INTRAVENOUS | Status: DC | PRN

## 2016-04-16 MED ORDER — FENTANYL CITRATE (PF) 100 MCG/2ML IJ SOLN: 25.0000 ug | INTRAMUSCULAR | Status: DC | PRN

## 2016-04-16 MED ORDER — SIMVASTATIN 10 MG PO TABS: 40.0000 mg | ORAL_TABLET | Freq: Every evening | ORAL | Status: DC

## 2016-04-16 MED ORDER — ALPRAZOLAM 0.5 MG PO TABS
0.2500 mg | ORAL_TABLET | Freq: Two times a day (BID) | ORAL | Status: DC | PRN
Start: 2016-04-16 — End: 2016-04-16

## 2016-04-16 MED ORDER — BISACODYL 10 MG RE SUPP: 10.0000 mg | Freq: Every day | RECTAL | Status: DC | PRN

## 2016-04-16 MED ORDER — SODIUM CHLORIDE 0.9% FLUSH
3.0000 mL | Freq: Two times a day (BID) | INTRAVENOUS | Status: DC
  Administered 2016-04-16 (×2): 3 mL via INTRAVENOUS

## 2016-04-16 MED ORDER — CALCIUM CITRATE-VITAMIN D 500-400 MG-UNIT PO CHEW
1.0000 | CHEWABLE_TABLET | Freq: Every day | ORAL | Status: DC
  Filled 2016-04-16: qty 1

## 2016-04-16 MED ORDER — OXYCODONE HCL 5 MG PO TABS
10.0000 mg | ORAL_TABLET | Freq: Four times a day (QID) | ORAL | Status: DC | PRN
  Administered 2016-04-16: 10 mg via ORAL
  Filled 2016-04-16: qty 2

## 2016-04-16 MED ORDER — SOTALOL HCL 80 MG PO TABS
80.0000 mg | ORAL_TABLET | Freq: Every day | ORAL | Status: DC
  Administered 2016-04-16: 80 mg via ORAL
  Filled 2016-04-16 (×4): qty 1

## 2016-04-16 MED ORDER — SODIUM CHLORIDE 0.9 % IV SOLN
250.0000 mL | INTRAVENOUS | Status: DC | PRN
Start: 2016-04-16 — End: 2016-04-16

## 2016-04-16 MED ORDER — MECLIZINE HCL 12.5 MG PO TABS: 25.0000 mg | ORAL_TABLET | Freq: Three times a day (TID) | ORAL | Status: DC | PRN

## 2016-04-16 MED ORDER — CALCIUM CARBONATE-VITAMIN D 500-200 MG-UNIT PO TABS
1.0000 | ORAL_TABLET | Freq: Every day | ORAL | Status: DC
  Administered 2016-04-16: 1 via ORAL
  Filled 2016-04-16: qty 1

## 2016-04-16 MED ORDER — ONDANSETRON HCL 4 MG PO TABS: 4.0000 mg | ORAL_TABLET | Freq: Three times a day (TID) | ORAL | Status: DC | PRN

## 2016-04-16 NOTE — ED Notes (Signed)
Pt O2 89%, placed on 2L 02 via [place]
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
## 2016-04-16 NOTE — Progress Notes (Signed)
Patient declined IS stating that she has one at home and she thinks she is due for discharge today. We reviewed how to use the IS and discussed her current goals ( volume and repetitions.) If she is not discharged today she agreed to have her husband bring it to her, if the IS is not brought in by tomorrow another will be issued and review with her.

## 2016-04-16 NOTE — ED Notes (Signed)
Assisted pt on bedpan. 

## 2016-04-16 NOTE — ED Notes (Signed)
Norma Barajas - pt's husband [phone]
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
## 2016-04-16 NOTE — Progress Notes (Signed)
Patient alert and oriented, independent, VSS, pt. Tolerating diet well. No complaints of pain or nausea. Pt. Had IV removed tip intact. Pt. Had prescriptions given. Pt. Voiced understanding of discharge instructions with no further questions. Pt. Discharged via wheelchair with auxilliary.  
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
## 2016-04-16 NOTE — ED Notes (Signed)
Pt voided, off bed pan, cleaned and pulled up in bed with assistance

## 2016-04-20 ENCOUNTER — Telehealth: Payer: Self-pay | Admitting: *Deleted
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
## 2016-04-21 ENCOUNTER — Encounter: Payer: Self-pay | Admitting: Cardiovascular Disease

## 2016-04-21 ENCOUNTER — Ambulatory Visit: Payer: Commercial Managed Care - HMO | Admitting: Cardiovascular Disease

## 2016-04-21 ENCOUNTER — Ambulatory Visit (INDEPENDENT_AMBULATORY_CARE_PROVIDER_SITE_OTHER): Payer: Commercial Managed Care - HMO | Admitting: Cardiovascular Disease

## 2016-04-21 ENCOUNTER — Ambulatory Visit: Payer: Self-pay | Admitting: Pharmacist

## 2016-04-21 VITALS — BP 136/76 | HR 79 | Ht 61.0 in | Wt 135.0 lb

## 2016-04-21 DIAGNOSIS — I481 Persistent atrial fibrillation: Secondary | ICD-10-CM

## 2016-04-21 DIAGNOSIS — I4819 Other persistent atrial fibrillation: Secondary | ICD-10-CM

## 2016-04-21 MED ORDER — CARVEDILOL 6.25 MG PO TABS: 6.2500 mg | ORAL_TABLET | Freq: Two times a day (BID) | ORAL | Status: DC

## 2016-04-21 NOTE — Patient Instructions (Signed)
Your physician wants you to follow-up in: 6 Months with Dr. [name]. You will receive a reminder letter in the mail two months in advance. If you don't receive a letter, please call our office to schedule the follow-up appointment.  Your physician recommends that you continue on your current medications as directed. Please refer to the Current Medication list given to you today.  Your physician recommends that you schedule a follow-up appointment with Dr. Curt Bears in 2-3 months.   If you need a refill on your cardiac medications before your next appointment, please call your pharmacy.  Thank you for choosing [facility]!

## 2016-04-22 ENCOUNTER — Ambulatory Visit: Payer: Self-pay | Admitting: Pharmacist

## 2016-04-22 ENCOUNTER — Encounter: Payer: Self-pay | Admitting: Family Medicine

## 2016-04-22 ENCOUNTER — Ambulatory Visit (INDEPENDENT_AMBULATORY_CARE_PROVIDER_SITE_OTHER): Payer: Commercial Managed Care - HMO | Admitting: Family Medicine

## 2016-04-22 VITALS — BP 154/78 | HR 74 | Temp 97.0°F | Ht 61.0 in | Wt 137.8 lb

## 2016-04-22 DIAGNOSIS — S2243XD Multiple fractures of ribs, bilateral, subsequent encounter for fracture with routine healing: Secondary | ICD-10-CM | POA: Diagnosis not present

## 2016-04-22 DIAGNOSIS — S2220XD Unspecified fracture of sternum, subsequent encounter for fracture with routine healing: Secondary | ICD-10-CM | POA: Diagnosis not present

## 2016-04-22 DIAGNOSIS — S2243XS Multiple fractures of ribs, bilateral, sequela: Secondary | ICD-10-CM

## 2016-04-22 DIAGNOSIS — I4819 Other persistent atrial fibrillation: Secondary | ICD-10-CM

## 2016-04-22 DIAGNOSIS — I481 Persistent atrial fibrillation: Secondary | ICD-10-CM | POA: Diagnosis not present

## 2016-04-22 DIAGNOSIS — R7303 Prediabetes: Secondary | ICD-10-CM

## 2016-04-22 LAB — COAGUCHEK XS/INR WAIVED
INR: 2.8 — ABNORMAL HIGH (ref 0.9–1.1)
Prothrombin Time: 33.6 s

## 2016-04-22 MED ORDER — LINACLOTIDE 145 MCG PO CAPS: 145.0000 ug | ORAL_CAPSULE | Freq: Every day | ORAL | Status: DC

## 2016-04-22 MED ORDER — OXYCODONE HCL 10 MG PO TABS: 10.0000 mg | ORAL_TABLET | Freq: Four times a day (QID) | ORAL | Status: DC | PRN

## 2016-04-22 MED ORDER — POLYETHYLENE GLYCOL 3350 17 GM/SCOOP PO POWD: 17.0000 g | Freq: Two times a day (BID) | ORAL | Status: DC | PRN
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
## 2016-04-26 ENCOUNTER — Telehealth: Payer: Self-pay | Admitting: Family Medicine

## 2016-04-26 NOTE — Addendum Note (Signed)
Addended by: [name] on: 04/26/2016 05:02 PM   Modules accepted: Level of Service

## 2016-04-26 NOTE — Telephone Encounter (Signed)
Called to speak with pt and she states she has already spoke with [facility] and doesn't need anything.

## 2016-04-26 NOTE — Telephone Encounter (Signed)
Patient states she is not constipated and she is going to try it every other day and then if that doesn't help she will stop it and let us know.

## 2016-04-26 NOTE — Telephone Encounter (Signed)
Patient states that she is really nauseated since starting the miralax. Please advise

## 2016-04-26 NOTE — Telephone Encounter (Signed)
Pt can take Miralax every other day. Is patient still constipated?

## 2016-04-27 ENCOUNTER — Telehealth: Payer: Self-pay | Admitting: *Deleted

## 2016-04-27 NOTE — Telephone Encounter (Signed)
[employer] is asking for clarification on pt, they have orders for Carvedilol & Sotalol & Atenolol on record, please call them and verfiy which ones she is suppose to be taking,[phone], thanks

## 2016-04-28 ENCOUNTER — Encounter: Payer: Self-pay | Admitting: Physician Assistant

## 2016-04-28 ENCOUNTER — Ambulatory Visit (INDEPENDENT_AMBULATORY_CARE_PROVIDER_SITE_OTHER): Payer: Commercial Managed Care - HMO | Admitting: Physician Assistant

## 2016-04-28 ENCOUNTER — Ambulatory Visit: Payer: Commercial Managed Care - HMO | Admitting: Cardiology

## 2016-04-28 VITALS — BP 148/87 | HR 79 | Temp 97.7°F | Ht 61.0 in | Wt 135.4 lb

## 2016-04-28 DIAGNOSIS — K219 Gastro-esophageal reflux disease without esophagitis: Secondary | ICD-10-CM | POA: Diagnosis not present

## 2016-04-28 DIAGNOSIS — R0789 Other chest pain: Secondary | ICD-10-CM | POA: Diagnosis not present

## 2016-04-28 MED ORDER — PANTOPRAZOLE SODIUM 40 MG PO TBEC: 40.0000 mg | DELAYED_RELEASE_TABLET | Freq: Every day | ORAL | Status: DC

## 2016-04-28 NOTE — Telephone Encounter (Signed)
Pt on Coreg,Sotolol and NOT Atenolol,I spoke with the [facility] pharmacist Heidi to clarify

## 2016-04-28 NOTE — Patient Instructions (Signed)
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
## 2016-05-04 ENCOUNTER — Other Ambulatory Visit: Payer: Self-pay | Admitting: Family

## 2016-05-07 ENCOUNTER — Ambulatory Visit: Payer: Commercial Managed Care - HMO | Admitting: Family Medicine

## 2016-05-17 ENCOUNTER — Ambulatory Visit (INDEPENDENT_AMBULATORY_CARE_PROVIDER_SITE_OTHER): Payer: Commercial Managed Care - HMO | Admitting: Pharmacist

## 2016-05-17 VITALS — BP 122/78

## 2016-05-17 DIAGNOSIS — I480 Paroxysmal atrial fibrillation: Secondary | ICD-10-CM | POA: Diagnosis not present

## 2016-05-17 LAB — COAGUCHEK XS/INR WAIVED
INR: 3.7 — ABNORMAL HIGH (ref 0.9–1.1)
Prothrombin Time: 44.6 s

## 2016-05-17 NOTE — Patient Instructions (Addendum)
Anticoagulation Dose Instructions as of 05/17/2016      Norma Barajas Tue Wed Thu Fri Sat   New Dose 3 mg 1.5 mg 3 mg 1.5 mg 3 mg 1.5 mg 3 mg    Description        No warfarin today - July 10th.  Then decrease to warfarin 3mg  dose of 1/2 tablet mondays, wednesdays and fridays.  Take 1 tablet all other days.      INR was 3.7 today

## 2016-05-25 ENCOUNTER — Encounter: Payer: Self-pay | Admitting: Family Medicine

## 2016-05-25 ENCOUNTER — Ambulatory Visit (INDEPENDENT_AMBULATORY_CARE_PROVIDER_SITE_OTHER): Payer: Commercial Managed Care - HMO | Admitting: Family Medicine

## 2016-05-25 VITALS — BP 142/84 | HR 67 | Temp 97.1°F | Ht 61.0 in | Wt 133.2 lb

## 2016-05-25 DIAGNOSIS — F411 Generalized anxiety disorder: Secondary | ICD-10-CM | POA: Diagnosis not present

## 2016-05-25 DIAGNOSIS — I480 Paroxysmal atrial fibrillation: Secondary | ICD-10-CM | POA: Diagnosis not present

## 2016-05-25 DIAGNOSIS — S2243XS Multiple fractures of ribs, bilateral, sequela: Secondary | ICD-10-CM | POA: Diagnosis not present

## 2016-05-25 DIAGNOSIS — I10 Essential (primary) hypertension: Secondary | ICD-10-CM | POA: Diagnosis not present

## 2016-05-25 DIAGNOSIS — S2220XD Unspecified fracture of sternum, subsequent encounter for fracture with routine healing: Secondary | ICD-10-CM | POA: Diagnosis not present

## 2016-05-25 LAB — COAGUCHEK XS/INR WAIVED
INR: 2.8 — ABNORMAL HIGH (ref 0.9–1.1)
PROTHROMBIN TIME: 33.5 s

## 2016-05-25 MED ORDER — WARFARIN SODIUM 3 MG PO TABS: ORAL_TABLET | ORAL | Status: DC

## 2016-05-25 MED ORDER — ALPRAZOLAM 0.25 MG PO TABS: ORAL_TABLET | ORAL | Status: DC

## 2016-05-25 MED ORDER — CARVEDILOL 6.25 MG PO TABS: 6.2500 mg | ORAL_TABLET | Freq: Two times a day (BID) | ORAL | Status: DC

## 2016-05-25 MED ORDER — AMLODIPINE BESYLATE 10 MG PO TABS: 10.0000 mg | ORAL_TABLET | Freq: Every day | ORAL | Status: DC

## 2016-05-25 MED ORDER — PANTOPRAZOLE SODIUM 40 MG PO TBEC: 40.0000 mg | DELAYED_RELEASE_TABLET | Freq: Every day | ORAL | Status: DC

## 2016-05-25 NOTE — Patient Instructions (Signed)
Continue medications as is. No changes were made today. Come in 2 days before your next appt. For blood work.
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
## 2016-06-01 ENCOUNTER — Other Ambulatory Visit: Payer: Self-pay | Admitting: Cardiology

## 2016-06-01 NOTE — Telephone Encounter (Signed)
REFILL 

## 2016-06-14 ENCOUNTER — Ambulatory Visit: Payer: 59 | Admitting: Cardiology

## 2016-06-24 ENCOUNTER — Ambulatory Visit (INDEPENDENT_AMBULATORY_CARE_PROVIDER_SITE_OTHER): Payer: Commercial Managed Care - HMO | Admitting: Pharmacist

## 2016-06-24 DIAGNOSIS — I480 Paroxysmal atrial fibrillation: Secondary | ICD-10-CM | POA: Diagnosis not present

## 2016-06-24 DIAGNOSIS — Z7901 Long term (current) use of anticoagulants: Secondary | ICD-10-CM | POA: Diagnosis not present

## 2016-06-24 LAB — COAGUCHEK XS/INR WAIVED
INR: 2.4 — AB (ref 0.9–1.1)
Prothrombin Time: 28.8 s

## 2016-06-28 ENCOUNTER — Ambulatory Visit: Payer: Commercial Managed Care - HMO | Admitting: Cardiology

## 2016-06-30 ENCOUNTER — Encounter: Payer: Self-pay | Admitting: *Deleted

## 2016-06-30 DIAGNOSIS — Z1231 Encounter for screening mammogram for malignant neoplasm of breast: Secondary | ICD-10-CM | POA: Diagnosis not present

## 2016-07-05 ENCOUNTER — Other Ambulatory Visit: Payer: Self-pay | Admitting: *Deleted

## 2016-07-05 DIAGNOSIS — R928 Other abnormal and inconclusive findings on diagnostic imaging of breast: Secondary | ICD-10-CM

## 2016-07-06 ENCOUNTER — Telehealth: Payer: Self-pay | Admitting: Family Medicine

## 2016-07-06 NOTE — Telephone Encounter (Signed)
Dr. Livia Snellen,   Which imaging would you like for this patient to have performed?  The reports lists several recommendations.  Please advise.

## 2016-07-06 NOTE — Telephone Encounter (Signed)
Radiology will usually follow up and do them on their own. Needs sopt views and ultrasound. Please make sure pt. Is in contact with [facility].  Thanks,  WS

## 2016-07-07 NOTE — Telephone Encounter (Signed)
Patient aware, radiology aware.

## 2016-07-14 ENCOUNTER — Other Ambulatory Visit: Payer: Self-pay | Admitting: Family Medicine

## 2016-07-14 DIAGNOSIS — R928 Other abnormal and inconclusive findings on diagnostic imaging of breast: Secondary | ICD-10-CM

## 2016-07-19 ENCOUNTER — Other Ambulatory Visit: Payer: Self-pay | Admitting: Family Medicine

## 2016-07-19 DIAGNOSIS — N6320 Unspecified lump in the left breast, unspecified quadrant: Secondary | ICD-10-CM

## 2016-07-20 ENCOUNTER — Encounter (HOSPITAL_COMMUNITY): Payer: Commercial Managed Care - HMO

## 2016-07-27 ENCOUNTER — Ambulatory Visit (HOSPITAL_COMMUNITY)
Admission: RE | Admit: 2016-07-27 | Discharge: 2016-07-27 | Disposition: A | Discharge status: Home / Self Care | Payer: Commercial Managed Care - HMO | Source: Ambulatory Visit | Attending: Family Medicine | Admitting: Family Medicine

## 2016-07-27 DIAGNOSIS — N6489 Other specified disorders of breast: Secondary | ICD-10-CM | POA: Diagnosis not present

## 2016-07-27 DIAGNOSIS — R928 Other abnormal and inconclusive findings on diagnostic imaging of breast: Secondary | ICD-10-CM | POA: Diagnosis not present

## 2016-07-27 DIAGNOSIS — N6320 Unspecified lump in the left breast, unspecified quadrant: Secondary | ICD-10-CM

## 2016-08-05 ENCOUNTER — Ambulatory Visit (INDEPENDENT_AMBULATORY_CARE_PROVIDER_SITE_OTHER): Payer: Commercial Managed Care - HMO | Admitting: Pharmacist

## 2016-08-05 DIAGNOSIS — I480 Paroxysmal atrial fibrillation: Secondary | ICD-10-CM

## 2016-08-05 DIAGNOSIS — Z23 Encounter for immunization: Secondary | ICD-10-CM | POA: Diagnosis not present

## 2016-08-05 LAB — COAGUCHEK XS/INR WAIVED
INR: 1.9 — AB (ref 0.9–1.1)
Prothrombin Time: 22.6 s

## 2016-08-09 ENCOUNTER — Other Ambulatory Visit: Payer: Self-pay | Admitting: Pharmacist

## 2016-08-09 NOTE — Patient Outreach (Signed)
Outreach call to [employer] regarding her request for follow up from the [facility] Medication Adherence Campaign. HIPAA identifiers verified and verbal consent received.   Patient reports that she has been taking her simvastatin once daily as directed. Denies any missed doses or any barriers to taking her medications such as cost or side effects.   Patient reports that she has no medication questions or concerns at this time.  Harlow Asa, PharmD Clinical Pharmacist [facility] [phone]

## 2016-08-25 ENCOUNTER — Other Ambulatory Visit (INDEPENDENT_AMBULATORY_CARE_PROVIDER_SITE_OTHER): Payer: Commercial Managed Care - HMO

## 2016-08-25 DIAGNOSIS — E559 Vitamin D deficiency, unspecified: Secondary | ICD-10-CM

## 2016-08-25 DIAGNOSIS — I10 Essential (primary) hypertension: Secondary | ICD-10-CM

## 2016-08-26 DIAGNOSIS — E559 Vitamin D deficiency, unspecified: Secondary | ICD-10-CM | POA: Diagnosis not present

## 2016-08-26 DIAGNOSIS — I10 Essential (primary) hypertension: Secondary | ICD-10-CM | POA: Diagnosis not present

## 2016-08-27 ENCOUNTER — Ambulatory Visit (INDEPENDENT_AMBULATORY_CARE_PROVIDER_SITE_OTHER): Payer: Commercial Managed Care - HMO | Admitting: Family Medicine

## 2016-08-27 ENCOUNTER — Encounter: Payer: Self-pay | Admitting: Family Medicine

## 2016-08-27 VITALS — BP 120/78 | HR 72 | Temp 97.0°F | Ht 61.0 in | Wt 136.5 lb

## 2016-08-27 DIAGNOSIS — Z7901 Long term (current) use of anticoagulants: Secondary | ICD-10-CM

## 2016-08-27 DIAGNOSIS — I480 Paroxysmal atrial fibrillation: Secondary | ICD-10-CM | POA: Diagnosis not present

## 2016-08-27 DIAGNOSIS — I10 Essential (primary) hypertension: Secondary | ICD-10-CM | POA: Diagnosis not present

## 2016-08-27 DIAGNOSIS — R7303 Prediabetes: Secondary | ICD-10-CM

## 2016-08-27 DIAGNOSIS — F411 Generalized anxiety disorder: Secondary | ICD-10-CM | POA: Diagnosis not present

## 2016-08-27 DIAGNOSIS — E782 Mixed hyperlipidemia: Secondary | ICD-10-CM

## 2016-08-27 LAB — CMP14+EGFR
ALBUMIN: 4.6 g/dL (ref 3.5–4.8)
ALK PHOS: 65 IU/L (ref 39–117)
ALT: 13 IU/L (ref 0–32)
AST: 21 IU/L (ref 0–40)
Albumin/Globulin Ratio: 1.9 (ref 1.2–2.2)
BILIRUBIN TOTAL: 0.3 mg/dL (ref 0.0–1.2)
BUN / CREAT RATIO: 23 (ref 12–28)
BUN: 16 mg/dL (ref 8–27)
CHLORIDE: 101 mmol/L (ref 96–106)
CO2: 27 mmol/L (ref 18–29)
CREATININE: 0.71 mg/dL (ref 0.57–1.00)
Calcium: 8.6 mg/dL — ABNORMAL LOW (ref 8.7–10.3)
GFR calc non Af Amer: 82 mL/min/{1.73_m2} (ref >59)
GFR, EST AFRICAN AMERICAN: 95 mL/min/{1.73_m2} (ref >59)
GLOBULIN, TOTAL: 2.4 g/dL (ref 1.5–4.5)
Glucose: 97 mg/dL (ref 65–99)
Potassium: 3.9 mmol/L (ref 3.5–5.2)
SODIUM: 144 mmol/L (ref 134–144)
Total Protein: 7 g/dL (ref 6.0–8.5)

## 2016-08-27 LAB — CBC WITH DIFFERENTIAL/PLATELET
BASOS: 1 %
Basophils Absolute: 0.1 10*3/uL (ref 0.0–0.2)
EOS (ABSOLUTE): 0.1 10*3/uL (ref 0.0–0.4)
Eos: 2 %
HEMOGLOBIN: 14.4 g/dL (ref 11.1–15.9)
Hematocrit: 43.3 % (ref 34.0–46.6)
IMMATURE GRANS (ABS): 0 10*3/uL (ref 0.0–0.1)
Immature Granulocytes: 0 %
LYMPHS ABS: 1.8 10*3/uL (ref 0.7–3.1)
LYMPHS: 38 %
MCH: 31 pg (ref 26.6–33.0)
MCHC: 33.3 g/dL (ref 31.5–35.7)
MCV: 93 fL (ref 79–97)
Monocytes Absolute: 0.5 10*3/uL (ref 0.1–0.9)
Monocytes: 10 %
NEUTROS ABS: 2.3 10*3/uL (ref 1.4–7.0)
Neutrophils: 49 %
PLATELETS: 183 10*3/uL (ref 150–379)
RBC: 4.65 x10E6/uL (ref 3.77–5.28)
RDW: 14.9 % (ref 12.3–15.4)
WBC: 4.7 10*3/uL (ref 3.4–10.8)

## 2016-08-27 LAB — VITAMIN D 25 HYDROXY (VIT D DEFICIENCY, FRACTURES): VIT D 25 HYDROXY: 47.9 ng/mL (ref 30.0–100.0)

## 2016-08-27 LAB — COAGUCHEK XS/INR WAIVED
INR: 1.8 — AB (ref 0.9–1.1)
Prothrombin Time: 22.1 s

## 2016-08-27 MED ORDER — ALPRAZOLAM 0.25 MG PO TABS: ORAL_TABLET | ORAL | 1 refills | Status: DC
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
## 2016-08-27 NOTE — Addendum Note (Signed)
Addended by: Liliane Bade on: 08/27/2016 10:52 AM   Modules accepted: Orders

## 2016-08-28 LAB — LIPID PANEL
CHOL/HDL RATIO: 4.1 ratio (ref 0.0–4.4)
CHOLESTEROL TOTAL: 165 mg/dL (ref 100–199)
HDL: 40 mg/dL (ref >39)
LDL CALC: 75 mg/dL (ref 0–99)
TRIGLYCERIDES: 252 mg/dL — AB (ref 0–149)
VLDL CHOLESTEROL CAL: 50 mg/dL — AB (ref 5–40)

## 2016-08-30 ENCOUNTER — Telehealth: Payer: Self-pay | Admitting: Pharmacist

## 2016-10-06 ENCOUNTER — Telehealth: Payer: Self-pay | Admitting: Pharmacist

## 2016-10-06 ENCOUNTER — Encounter: Payer: Self-pay | Admitting: Pharmacist

## 2016-10-06 ENCOUNTER — Ambulatory Visit (INDEPENDENT_AMBULATORY_CARE_PROVIDER_SITE_OTHER): Payer: Commercial Managed Care - HMO | Admitting: Pharmacist

## 2016-10-06 VITALS — BP 126/78 | HR 80 | Ht 60.75 in | Wt 138.0 lb

## 2016-10-06 DIAGNOSIS — M810 Age-related osteoporosis without current pathological fracture: Secondary | ICD-10-CM

## 2016-10-06 DIAGNOSIS — I480 Paroxysmal atrial fibrillation: Secondary | ICD-10-CM | POA: Diagnosis not present

## 2016-10-06 DIAGNOSIS — Z23 Encounter for immunization: Secondary | ICD-10-CM | POA: Diagnosis not present

## 2016-10-06 DIAGNOSIS — Z0000 Encounter for general adult medical examination without abnormal findings: Secondary | ICD-10-CM

## 2016-10-06 LAB — COAGUCHEK XS/INR WAIVED
INR: 2 — AB (ref 0.9–1.1)
PROTHROMBIN TIME: 23.4 s

## 2016-10-06 NOTE — Patient Instructions (Addendum)
  Ms. Keesler , Thank you for taking time to come for your Medicare Wellness Visit. I appreciate your ongoing commitment to your health goals. Please review the following plan we discussed and let me know if I can assist you in the future.   These are the goals we discussed:  Continue to exercise / walk daily  It was recommended to recheck mammogram and ultrasound 3 months from previous ultrasound due to abnormality seen 07/2016.  Please consider getting this in December 2017.   Your triglycerides were elevated the last time checked - recommend start Fish Oil 1000mg  - take 2 capsules daily.  Also follow the diet instrutions below help decrease triglycerides: Limit sugar and processed foods (cakes, cookies, ice cream, crackers and chips) Increase fresh fruit but limit serving sizes 1/2 cup or about the size of tennis or baseball Limit red meat to no more than 1-2 times per week (serving size about the size of your palm) Avoid sugar and calorie containing beverages - soda, sweet tea and juice.  Choose water or unsweetened tea instead. Choose whole grains / lean proteins - whole wheat bread, quinoa, whole grain rice (1/2 cup), fish, chicken, [place] Increase non-starchy vegetables - carrots, green bean, squash, zucchini, tomatoes, onions, peppers, spinach and other green leafy vegetables, cabbage, lettuce, cucumbers, asparagus, okra (not fried), eggplant   This is a list of the screening recommended for you and due dates:  Health Maintenance  Topic Date Due  . Tetanus Vaccine  01/15/1958  . Shingles Vaccine  01/16/1999  . Pneumonia vaccines (2 of 2 - PPSV23) Completed  . DEXA scan (bone density measurement)  01/28/2017  . Mammogram  10/2016 - repeat recommended due to abnormality 07/2016  . Flu Shot  Completed

## 2016-10-06 NOTE — Telephone Encounter (Signed)
Opened in error

## 2016-10-06 NOTE — Progress Notes (Addendum)
Patient ID: Norma Barajas, female   DOB: 1941/06/08, 75 y.o.   MRN: [id number]     Subjective:   Norma Barajas is a 75 y.o. female who presents for a subsequent Medicare Annual Wellness Visit.  Norma Barajas is married. She lives with her husband in [place].  She is retired from [employer] - retired in 1997.  She has one son Norma Barajas.  Current Medications (verified) Outpatient Encounter Prescriptions as of 10/06/2016  Medication Sig  . ALPRAZolam (XANAX) 0.25 MG tablet TAKE 1 TABLET BY MOUTH TWICE A DAY AS NEEDED FOR ANXIETY, NERVES, OR SLEEP  . amLODipine (NORVASC) 10 MG tablet Take 1 tablet (10 mg total) by mouth daily.  . carvedilol (COREG) 6.25 MG tablet Take 1 tablet (6.25 mg total) by mouth 2 (two) times daily.  . cholecalciferol (VITAMIN D) 1000 UNITS tablet Take 1,000 Units by mouth daily.  . Multiple Vitamin (MULTIVITAMIN) capsule Take 1 capsule by mouth every evening.  Marland Kitchen omeprazole (PRILOSEC) 20 MG capsule Take 20 mg by mouth daily.  . simvastatin (ZOCOR) 40 MG tablet Take 1 tablet (40 mg total) by mouth every evening.  . warfarin (COUMADIN) 3 MG tablet TAKE 1/2 TO 1 TABLET BY MOUTH DAILY AS DIRECTED BY ANTICOAGULATION CLINIC.  . [DISCONTINUED] pantoprazole (PROTONIX) 40 MG tablet Take 1 tablet (40 mg total) by mouth daily. (Patient not taking: Reported on 10/06/2016)   No facility-administered encounter medications on file as of 10/06/2016.     Allergies (verified) Triamterene-hctz; Atorvastatin; Crestor [rosuvastatin calcium]; Morphine; and Risedronate sodium   History: Past Medical History:  Diagnosis Date  . Anxiety   . Arthritis    "right leg" (04/11/2015)  . Cataract   . Coronary atherosclerosis of native coronary artery    a. Nonobstructive minimal CAD 10/2005.  Marland Kitchen Depression   . Diastolic dysfunction    Grade 1. Ejection fraction 60-65%.  . Erosive esophagitis   . Essential hypertension   . GERD (gastroesophageal reflux disease)   . Heart murmur   . History of  hiatal hernia   . Hypercholesterolemia   . Migraine    "used to have them right bad; I don't now" (04/11/2015)  . Osteoporosis   . Paroxysmal atrial fibrillation (HCC)   . PONV (postoperative nausea and vomiting)   . Vertigo    Past Surgical History:  Procedure Laterality Date  . BRAVO [place] STUDY  11/15/2012   Procedure: BRAVO [place];  Surgeon: [name], MD;  Location[facility];  Service: Endoscopy;;  . CATARACT EXTRACTION W/ INTRAOCULAR LENS  IMPLANT, BILATERAL Bilateral   . COLONOSCOPY  2008   Dr. [name]: internal hemorrhoids   . DILATION AND CURETTAGE OF UTERUS    . ESOPHAGOGASTRODUODENOSCOPY (EGD) WITH ESOPHAGEAL DILATION  2001   Dr. [name]: erosive esophagitis, esophageal stricture, duodenitis, s/p Savary dilation  . ESOPHAGOGASTRODUODENOSCOPY (EGD) WITH ESOPHAGEAL DILATION  11/15/2012   [id number] web was found & MOST LIKELY CAUSE FOR DYAPHAGIA/Polyp was found in the gastric body and gastric fundus/ gastritis on bx  . EYE SURGERY Bilateral    "laser OR after cataract OR; cause I couldn't see"  . FLEXIBLE SIGMOIDOSCOPY N/A 08/22/2014   Procedure: FLEXIBLE SIGMOIDOSCOPY;  Surgeon: [name], MD;  Location: [facility];  Service: Endoscopy;  Laterality: N/A;  830  . FRACTURE SURGERY    . HEMORRHOID BANDING N/A 08/22/2014   Procedure: HEMORRHOID BANDING;  Surgeon: [name], MD;  Location: [facility];  Service: Endoscopy;  Laterality: N/A;  .  OPEN REDUCTION INTERNAL FIXATION (ORIF) TIBIA/FIBULA FRACTURE Right 2013   broke tibia and fibula after falling down stairs  . PROLAPSED UTERINE FIBROID LIGATION  2015  . TOTAL ABDOMINAL HYSTERECTOMY     Family History  Problem Relation Age of Onset  . Colon cancer Mother     Diagnosed at age 54  . Heart disease Mother   . Osteoporosis Mother   . Hip fracture Mother   . Stroke Sister   . Diabetes Sister   . Cancer - Other Sister   . Osteoporosis Sister   . Arthritis Sister   . Cancer Sister     uterine  .  Stroke Sister   . Heart disease Father   . Hyperlipidemia Brother   . Hypertension Brother   . Stroke Brother   . Heart disease Sister   . Dementia Sister   . Diabetes Son   . Stroke Son   . Heart attack Neg Hx    Social History   Occupational History  . Retired     [employer] information centre manager   Social History Main Topics  . Smoking status: Never Smoker  . Smokeless tobacco: Never Used  . Alcohol use No  . Drug use: No  . Sexual activity: Yes    Do you feel safe at home?  Yes Are there smokers in your home (other than you)? No  Dietary issues and exercise activities: Current Exercise Habits: Home exercise routine, Type of exercise: walking, Time (Minutes): 15, Frequency (Times/Week): 6, Weekly Exercise (Minutes/Week): 90, Intensity: Mild  Current Dietary habits:  Eats usually 2 meals and large snack at lunchtime.   She and her husband eat out most evening meals  She tried to limit green leafy vegetables due to warfarin therapy and also limit salt due to BPDictation #1 [id number]  [id number]    Objective:    Today's Vitals   10/06/16 1012  BP: 126/78  Pulse: 80  Weight: 138 lb (62.6 kg)  Height: 5' 0.75" (1.543 m)  PainSc: 0-No pain   Body mass index is 26.29 kg/m.   INR was 2.0 today  Activities of Daily Living In your present state of health, do you have any difficulty performing the following activities: 10/06/2016 04/22/2016  Hearing? N N  Vision? N N  Difficulty concentrating or making decisions? N N  Walking or climbing stairs? N Y  Dressing or bathing? N N  Doing errands, shopping? N N  Preparing Food and eating ? N -  Using the Toilet? N -  In the past six months, have you accidently leaked urine? N -  Do you have problems with loss of bowel control? N -  Managing your Medications? N -  Managing your Finances? N -  Housekeeping or managing your Housekeeping? N -  Some recent data might be hidden     Cardiac Risk Factors include: advanced age (>47men, >40  women);dyslipidemia;family history of premature cardiovascular disease;hypertension  Depression Screen PHQ 2/9 Scores 10/06/2016 08/27/2016 04/28/2016 04/22/2016  PHQ - 2 Score 0 0 0 0     Fall Risk Fall Risk  10/06/2016 08/27/2016 04/22/2016 04/13/2016 03/23/2016  Falls in the past year? No No No No No  Number falls in past yr: - - - - -  Injury with Fall? - - - - -  Risk Factor Category  - - - - -  Risk for fall due to : - - - - -  Risk for fall due to (comments): - - - - -  Follow up - - - - -    Cognitive Function: MMSE - Mini Mental State Exam 10/06/2016 04/04/2015  Orientation to time 5 4  Orientation to Place 5 5  Registration 3 3  Attention/ Calculation 4 4  Recall 2 3  Language- name 2 objects 2 2  Language- repeat 1 1  Language- follow 3 step command 3 3  Language- read & follow direction 1 1  Write a sentence 1 1  Copy design 0 0  Total score 27 27    Immunizations and Health Maintenance Immunization History  Administered Date(s) Administered  . DTaP 08/27/2009  . Influenza Whole 08/04/2010  . Influenza, High Dose Seasonal PF 08/05/2016  . Influenza,inj,Quad PF,36+ Mos 08/20/2013, 08/19/2014, 08/20/2015  . Pneumococcal Conjugate-13 09/11/2015  . Pneumococcal Polysaccharide-23 10/06/2016   Health Maintenance Due  Topic Date Due  . TETANUS/TDAP  01/15/1958  . ZOSTAVAX  01/16/1999  . PNA vac Low Risk Adult (2 of 2 - PPSV23) 09/10/2016    Patient Care Team: [name], MD as PCP - General (Family Medicine) [name], MD as Attending Physician (Gastroenterology) Truc Manus Gunning, OD as Consulting Physician (Optometry) Josue Hector, MD as Consulting Physician (Cardiology)  Indicate any recent Medical Services you may have received from other than [facility] providers in the past year (date may be approximate).    Assessment:    Annual Wellness Visit  Therapeutic anticoagulation   Screening Tests Health Maintenance  Topic Date Due  . TETANUS/TDAP   01/15/1958  . ZOSTAVAX  01/16/1999  . PNA vac Low Risk Adult (2 of 2 - PPSV23) 09/10/2016  . DEXA SCAN  01/28/2017  . MAMMOGRAM  06/30/2018  . INFLUENZA VACCINE  Completed        Plan:   During the course of the visit Oaklee was educated and counseled about the following appropriate screening and preventive services:   Vaccines to include Pneumoccal, Influenza, Td, Zostavax - Pneumovax 23 given in office today.  Zostavax and Boostrix declined due to cost.  Colorectal cancer screening - UTD, last colonscopy 2015  Cardiovascular disease screening - UTD, last visit with cardiologist 04/2016  Diabetes screening - UTD last FBG was 97 and A1c 5.6%  Bone Denisty / Osteoporosis Screening - UTD, next due 01/2017  Mammogram - patient has abnormal mammogram and subsequent U/S 07/2016.  It is recommended that she recheck in 3 months. I tried to schedule repeat diagnostic mammogram and ultrasound but patient refused.   PAP - no longer required  Glaucoma screening /  Eye Exam - UTD  Advanced Directives - pt. declined  Physical Activity continue to walk daily  Continue current warfarin dose.  RTC in 1 month to recheck INR  Discussed elevated triglycerides and dietary therapy to help to decrease Tg.    Patient Instructions (the written plan) were given to the patient.   Cherre Robins, PharmD   10/06/2016     I have reviewed and agree with the above AWV documentation.  [name], M.D.
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
## 2016-10-28 ENCOUNTER — Other Ambulatory Visit: Payer: Self-pay | Admitting: Family Medicine

## 2016-10-28 DIAGNOSIS — F411 Generalized anxiety disorder: Secondary | ICD-10-CM

## 2016-11-02 NOTE — Telephone Encounter (Signed)
Refill requested too soon. Pt was given rx 08/27/16 for #180 with 1 refill, which would be 6 month supply.

## 2016-11-10 ENCOUNTER — Ambulatory Visit (INDEPENDENT_AMBULATORY_CARE_PROVIDER_SITE_OTHER): Payer: Medicare HMO | Admitting: Pharmacist

## 2016-11-10 DIAGNOSIS — I480 Paroxysmal atrial fibrillation: Secondary | ICD-10-CM

## 2016-11-10 LAB — COAGUCHEK XS/INR WAIVED
INR: 2.3 — AB (ref 0.9–1.1)
PROTHROMBIN TIME: 27.8 s

## 2016-12-15 ENCOUNTER — Other Ambulatory Visit: Payer: Self-pay | Admitting: Family Medicine

## 2016-12-15 ENCOUNTER — Ambulatory Visit (INDEPENDENT_AMBULATORY_CARE_PROVIDER_SITE_OTHER): Payer: Medicare HMO | Admitting: Pharmacist

## 2016-12-15 ENCOUNTER — Telehealth: Payer: Self-pay | Admitting: Pharmacist

## 2016-12-15 ENCOUNTER — Encounter (INDEPENDENT_AMBULATORY_CARE_PROVIDER_SITE_OTHER): Payer: Self-pay

## 2016-12-15 DIAGNOSIS — I480 Paroxysmal atrial fibrillation: Secondary | ICD-10-CM

## 2016-12-15 LAB — COAGUCHEK XS/INR WAIVED
INR: 2.6 — ABNORMAL HIGH (ref 0.9–1.1)
PROTHROMBIN TIME: 31.1 s

## 2016-12-15 NOTE — Telephone Encounter (Signed)
Per pt, she did not get an RX for Xanax for #180 pills She is adamant about this and would like to get a refill I also spoke to the pharmacy, and they do not have RX Please advise

## 2016-12-15 NOTE — Telephone Encounter (Signed)
Pt notified RX called into [place] per Dr Livia Snellen

## 2016-12-15 NOTE — Telephone Encounter (Signed)
RX called into [facility] per Dr Livia Snellen Pt notified

## 2016-12-15 NOTE — Telephone Encounter (Signed)
Please contact the patient : Occasionally there is disconnect between when I write the prescription and the prescription being called in area I suspect that there was a change in translation my apology for the confusion. Please send in 60 tablets 1 by mouth twice a day with 5 refills.  Thanks for working through this one with me.  [employer]

## 2016-12-15 NOTE — Telephone Encounter (Signed)
Please contact the patient the prescription I wrote for her 3 months ago had a 3 month supply plus a refill. She should be of get that refilled now based on that prescription. Have her check with the pharmacy that filled the original 3 months.

## 2017-01-04 ENCOUNTER — Telehealth: Payer: Self-pay | Admitting: Cardiovascular Disease

## 2017-01-04 NOTE — Telephone Encounter (Signed)
New Message    Request for surgical clearance:  1. What type of surgery is being performed? Removal of tooth  2. When is this surgery scheduled? Not scheduled yet  3. Are there any medications that need to be held prior to surgery and how long? Wants to know when she needs to come off blood thinner  4. Name of physician performing surgery? Malrie Johnson  5. What is your office phone and fax number? [phone] FAX:[phone]

## 2017-01-04 NOTE — Telephone Encounter (Signed)
Do not need to hold Coumadin for 1 tooth extraction since bleeding risk is low. Will route clearance back to dentist office as well as Cherre Robins who manages pt's Coumadin as an FYI about upcoming procedure.

## 2017-01-14 ENCOUNTER — Ambulatory Visit (INDEPENDENT_AMBULATORY_CARE_PROVIDER_SITE_OTHER): Payer: Medicare HMO | Admitting: Pediatrics

## 2017-01-14 ENCOUNTER — Encounter: Payer: Self-pay | Admitting: Pediatrics

## 2017-01-14 VITALS — BP 123/79 | HR 80 | Temp 97.4°F | Ht 60.75 in | Wt 146.0 lb

## 2017-01-14 DIAGNOSIS — R197 Diarrhea, unspecified: Secondary | ICD-10-CM | POA: Diagnosis not present

## 2017-01-14 DIAGNOSIS — R1084 Generalized abdominal pain: Secondary | ICD-10-CM

## 2017-01-14 NOTE — Progress Notes (Signed)
  Subjective:   Patient ID: Norma Barajas, female    DOB: 01-Jun-1941, 76 y.o.   MRN: [id number] CC: Diarrhea  HPI: Norma Barajas is a 76 y.o. female presenting for Diarrhea  Started on cephalexin for tooth infection on 2/22, took for two days Has had some diarrhea daily since then Now apprx 3-4 times a day Has abd pain after using bathroom that lasts for hours No blood in stool Loose stool, says a lot of it when she does go No fevers No nausea Taking probiotic Tooth is feeling ok Has f/u with dentist next month Appetite has been fine Eating regularly Diarrhea tends to come on after eating  Relevant past medical, surgical, family and social history reviewed. Allergies and medications reviewed and updated. History  Smoking Status  . Never Smoker  Smokeless Tobacco  . Never Used   ROS: Per HPI   Objective:    BP 123/79   Pulse 80   Temp 97.4 F (36.3 C) (Oral)   Ht 5' 0.75" (1.543 m)   Wt 146 lb (66.2 kg)   BMI 27.81 kg/m   Wt Readings from Last 3 Encounters:  01/14/17 146 lb (66.2 kg)  10/06/16 138 lb (62.6 kg)  08/27/16 136 lb 8 oz (61.9 kg)    Gen: NAD, alert, cooperative with exam, NCAT EYES: EOMI, no conjunctival injection, or no icterus ENT:  OP without erythema, gum disease lower front teeth, no abscess LYMPH: small < 1cm ant cervical LAD CV: NRRR, normal S1/S2, no murmur, distal pulses 2+ b/l Resp: CTABL, no wheezes, normal WOB Abd: +BS, soft, NTND. no guarding or organomegaly Ext: No edema, warm MSK: normal muscle bulk  Assessment & Plan:  Norma Barajas was seen today for diarrhea.  Diagnoses and all orders for this visit:  Diarrhea, unspecified type Ongoing since starting antibiotic, has continued for two weeks since stopping Having abd pain Well hydrated on exam Will send for stool studies Discussed trying to stop PPI with risk for microscopic colitis, pt does not want to stop or change at this time -     Cdiff NAA+O+P+Stool Culture  Generalized  abdominal pain Normal exam today -     Cdiff NAA+O+P+Stool Culture  Follow up plan: Next week if not improved Assunta Found, MD [place]

## 2017-01-18 ENCOUNTER — Other Ambulatory Visit: Payer: Self-pay | Admitting: Family Medicine

## 2017-01-18 DIAGNOSIS — N6320 Unspecified lump in the left breast, unspecified quadrant: Secondary | ICD-10-CM

## 2017-01-19 ENCOUNTER — Ambulatory Visit (INDEPENDENT_AMBULATORY_CARE_PROVIDER_SITE_OTHER): Payer: Medicare HMO | Admitting: Pharmacist

## 2017-01-19 DIAGNOSIS — I480 Paroxysmal atrial fibrillation: Secondary | ICD-10-CM | POA: Diagnosis not present

## 2017-01-19 DIAGNOSIS — R197 Diarrhea, unspecified: Secondary | ICD-10-CM | POA: Diagnosis not present

## 2017-01-19 DIAGNOSIS — R1084 Generalized abdominal pain: Secondary | ICD-10-CM | POA: Diagnosis not present

## 2017-01-19 LAB — COAGUCHEK XS/INR WAIVED
INR: 2 — ABNORMAL HIGH (ref 0.9–1.1)
Prothrombin Time: 24.1 s

## 2017-01-19 MED ORDER — OMEPRAZOLE 20 MG PO CPDR: 20.0000 mg | DELAYED_RELEASE_CAPSULE | Freq: Every day | ORAL | 1 refills | Status: DC

## 2017-01-25 ENCOUNTER — Telehealth: Payer: Self-pay | Admitting: Family Medicine

## 2017-01-25 NOTE — Telephone Encounter (Signed)
Patient called to let me know she received omeprazole from [facility] but not sotolol.  I explained to her that [facility] would not let me request a refill for her and she needed for call them herself to request refill.  Patient voiced understanding and that she would contact [employer].

## 2017-01-26 ENCOUNTER — Other Ambulatory Visit: Payer: Self-pay | Admitting: Cardiology

## 2017-01-26 LAB — CDIFF NAA+O+P+STOOL CULTURE
E coli, Shiga toxin Assay: NEGATIVE
Toxigenic C. Difficile by PCR: NEGATIVE

## 2017-02-08 ENCOUNTER — Telehealth: Payer: Self-pay | Admitting: Cardiovascular Disease

## 2017-02-08 NOTE — Telephone Encounter (Signed)
Patient states gradually she has gained weight across waist, at 146 lbs now. Usually weighs 135 lbs. States she has no ankle/leg swelling and read that Coreg causes weight gain and she states she is going to stop taking it.

## 2017-02-08 NOTE — Telephone Encounter (Signed)
Patient would like to speak with nurse regarding medications. / tg

## 2017-02-10 MED ORDER — METOPROLOL TARTRATE 25 MG PO TABS: 12.5000 mg | ORAL_TABLET | Freq: Two times a day (BID) | ORAL | 3 refills | Status: DC

## 2017-02-10 NOTE — Telephone Encounter (Signed)
Patient agrees to take lopressor 12.5 mg twice a day,will stop coreg

## 2017-02-10 NOTE — Addendum Note (Signed)
Addended by: Barbarann Ehlers A on: 02/10/2017 08:11 AM   Modules accepted: Orders

## 2017-02-14 ENCOUNTER — Telehealth: Payer: Self-pay

## 2017-02-14 NOTE — Telephone Encounter (Signed)
Per Dr [name], pt not on sotalol, may d/c from med list

## 2017-02-23 ENCOUNTER — Other Ambulatory Visit: Payer: Medicare HMO

## 2017-02-23 ENCOUNTER — Other Ambulatory Visit: Payer: Self-pay | Admitting: Family Medicine

## 2017-02-23 DIAGNOSIS — K219 Gastro-esophageal reflux disease without esophagitis: Secondary | ICD-10-CM

## 2017-02-23 DIAGNOSIS — I481 Persistent atrial fibrillation: Secondary | ICD-10-CM | POA: Diagnosis not present

## 2017-02-23 DIAGNOSIS — E782 Mixed hyperlipidemia: Secondary | ICD-10-CM | POA: Diagnosis not present

## 2017-02-23 DIAGNOSIS — I10 Essential (primary) hypertension: Secondary | ICD-10-CM

## 2017-02-23 DIAGNOSIS — I4819 Other persistent atrial fibrillation: Secondary | ICD-10-CM

## 2017-02-24 LAB — CBC WITH DIFFERENTIAL/PLATELET
BASOS: 1 %
Basophils Absolute: 0 10*3/uL (ref 0.0–0.2)
EOS (ABSOLUTE): 0.1 10*3/uL (ref 0.0–0.4)
EOS: 2 %
HEMATOCRIT: 44.2 % (ref 34.0–46.6)
HEMOGLOBIN: 14.9 g/dL (ref 11.1–15.9)
IMMATURE GRANS (ABS): 0 10*3/uL (ref 0.0–0.1)
IMMATURE GRANULOCYTES: 0 %
LYMPHS: 28 %
Lymphocytes Absolute: 1.4 10*3/uL (ref 0.7–3.1)
MCH: 31.8 pg (ref 26.6–33.0)
MCHC: 33.7 g/dL (ref 31.5–35.7)
MCV: 94 fL (ref 79–97)
Monocytes Absolute: 0.6 10*3/uL (ref 0.1–0.9)
Monocytes: 11 %
NEUTROS PCT: 58 %
Neutrophils Absolute: 2.9 10*3/uL (ref 1.4–7.0)
Platelets: 199 10*3/uL (ref 150–379)
RBC: 4.69 x10E6/uL (ref 3.77–5.28)
RDW: 14.7 % (ref 12.3–15.4)
WBC: 5 10*3/uL (ref 3.4–10.8)

## 2017-02-24 LAB — CMP14+EGFR
A/G RATIO: 1.5 (ref 1.2–2.2)
ALBUMIN: 4.4 g/dL (ref 3.5–4.8)
ALT: 22 IU/L (ref 0–32)
AST: 28 IU/L (ref 0–40)
Alkaline Phosphatase: 73 IU/L (ref 39–117)
BUN/Creatinine Ratio: 12 (ref 12–28)
BUN: 9 mg/dL (ref 8–27)
Bilirubin Total: 0.3 mg/dL (ref 0.0–1.2)
CALCIUM: 9.1 mg/dL (ref 8.7–10.3)
CO2: 28 mmol/L (ref 18–29)
Chloride: 100 mmol/L (ref 96–106)
Creatinine, Ser: 0.74 mg/dL (ref 0.57–1.00)
GFR, EST AFRICAN AMERICAN: 90 mL/min/{1.73_m2} (ref >59)
GFR, EST NON AFRICAN AMERICAN: 78 mL/min/{1.73_m2} (ref >59)
GLOBULIN, TOTAL: 3 g/dL (ref 1.5–4.5)
Glucose: 91 mg/dL (ref 65–99)
POTASSIUM: 4 mmol/L (ref 3.5–5.2)
SODIUM: 144 mmol/L (ref 134–144)
TOTAL PROTEIN: 7.4 g/dL (ref 6.0–8.5)

## 2017-02-24 LAB — LIPID PANEL
CHOL/HDL RATIO: 4.4 ratio (ref 0.0–4.4)
Cholesterol, Total: 177 mg/dL (ref 100–199)
HDL: 40 mg/dL (ref >39)
LDL Calculated: 83 mg/dL (ref 0–99)
Triglycerides: 272 mg/dL — ABNORMAL HIGH (ref 0–149)
VLDL Cholesterol Cal: 54 mg/dL — ABNORMAL HIGH (ref 5–40)

## 2017-02-28 ENCOUNTER — Telehealth: Payer: Self-pay | Admitting: Pharmacist

## 2017-02-28 ENCOUNTER — Encounter: Payer: Self-pay | Admitting: Family Medicine

## 2017-02-28 ENCOUNTER — Ambulatory Visit (INDEPENDENT_AMBULATORY_CARE_PROVIDER_SITE_OTHER): Payer: Medicare HMO | Admitting: Family Medicine

## 2017-02-28 VITALS — BP 121/76 | HR 71 | Temp 97.1°F | Ht 60.75 in | Wt 142.0 lb

## 2017-02-28 DIAGNOSIS — F411 Generalized anxiety disorder: Secondary | ICD-10-CM

## 2017-02-28 DIAGNOSIS — E782 Mixed hyperlipidemia: Secondary | ICD-10-CM | POA: Diagnosis not present

## 2017-02-28 DIAGNOSIS — I10 Essential (primary) hypertension: Secondary | ICD-10-CM

## 2017-02-28 DIAGNOSIS — K219 Gastro-esophageal reflux disease without esophagitis: Secondary | ICD-10-CM

## 2017-02-28 DIAGNOSIS — I481 Persistent atrial fibrillation: Secondary | ICD-10-CM

## 2017-02-28 DIAGNOSIS — R7303 Prediabetes: Secondary | ICD-10-CM | POA: Diagnosis not present

## 2017-02-28 DIAGNOSIS — I4819 Other persistent atrial fibrillation: Secondary | ICD-10-CM

## 2017-02-28 LAB — COAGUCHEK XS/INR WAIVED
INR: 2.2 — ABNORMAL HIGH (ref 0.9–1.1)
PROTHROMBIN TIME: 26.7 s

## 2017-02-28 LAB — BAYER DCA HB A1C WAIVED: HB A1C: 5.6 % (ref <7.0)
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
## 2017-02-28 NOTE — Telephone Encounter (Signed)
Patient has INR checked today.  Wanted to verify that she is to continue current warfarin dose.   Yes she is to continue current warfarin dose and recheck INR as planned 04/05/17

## 2017-03-01 ENCOUNTER — Other Ambulatory Visit: Payer: Self-pay | Admitting: Family Medicine

## 2017-03-11 ENCOUNTER — Telehealth: Payer: Self-pay | Admitting: Family Medicine

## 2017-03-11 NOTE — Telephone Encounter (Signed)
Needs to be seen. Is swelling getting worse?

## 2017-03-11 NOTE — Telephone Encounter (Signed)
Patient states that she think she is having some ankle swelling but she is not sure. Advised pt to come be seen as provider stated- patient states she does not think it is bad and will call back to make appointment if it gets worse.

## 2017-03-11 NOTE — Telephone Encounter (Signed)
What is the name of the medication? Furosemide 40mg   Have you contacted your pharmacy to request a refill? Yes   Which pharmacy would you like this sent to? [facility]   Patient notified that their request is being sent to the clinical staff for review and that they should receive a call once it is complete. If they do not receive a call within 24 hours they can check with their pharmacy or our office.

## 2017-03-11 NOTE — Telephone Encounter (Signed)
Not on med list

## 2017-03-16 ENCOUNTER — Other Ambulatory Visit: Payer: Self-pay | Admitting: *Deleted

## 2017-03-16 ENCOUNTER — Telehealth: Payer: Self-pay | Admitting: Family Medicine

## 2017-03-16 DIAGNOSIS — E7849 Other hyperlipidemia: Secondary | ICD-10-CM

## 2017-03-16 MED ORDER — SIMVASTATIN 40 MG PO TABS: 40.0000 mg | ORAL_TABLET | Freq: Every evening | ORAL | 4 refills | Status: DC

## 2017-03-16 NOTE — Telephone Encounter (Signed)
Go ahead and call in one refill and have her follow-up with PCP after that

## 2017-03-16 NOTE — Progress Notes (Signed)
RX sent into [facility] per pt request Okayed per Dr Livia Snellen per office protocol

## 2017-03-16 NOTE — Telephone Encounter (Signed)
Requesting refill on Zocor. Last filled 10/21/15 #90 R-4. Please advise, covering PCP.

## 2017-03-22 ENCOUNTER — Other Ambulatory Visit: Payer: Self-pay

## 2017-03-29 ENCOUNTER — Telehealth: Payer: Self-pay | Admitting: Family Medicine

## 2017-03-29 DIAGNOSIS — F411 Generalized anxiety disorder: Secondary | ICD-10-CM

## 2017-03-30 MED ORDER — ALPRAZOLAM 0.25 MG PO TABS: ORAL_TABLET | ORAL | 1 refills | Status: DC

## 2017-03-30 NOTE — Telephone Encounter (Signed)
Scrip for xanax signed. Thanks, WS

## 2017-03-30 NOTE — Telephone Encounter (Signed)
TC to [facility] Sotalol looks to be filled by Dr. [name]. Simvastatin was refilled on 03/16/17 to local pharmacy Sending Alprazolam request to Dr. Livia Snellen

## 2017-04-05 ENCOUNTER — Ambulatory Visit (INDEPENDENT_AMBULATORY_CARE_PROVIDER_SITE_OTHER): Payer: Medicare HMO | Admitting: Pharmacist

## 2017-04-05 DIAGNOSIS — I480 Paroxysmal atrial fibrillation: Secondary | ICD-10-CM | POA: Diagnosis not present

## 2017-04-05 LAB — COAGUCHEK XS/INR WAIVED
INR: 2.4 — AB (ref 0.9–1.1)
PROTHROMBIN TIME: 28.3 s

## 2017-04-06 ENCOUNTER — Other Ambulatory Visit: Payer: Self-pay | Admitting: *Deleted

## 2017-04-06 MED ORDER — SOTALOL HCL 80 MG PO TABS: ORAL_TABLET | ORAL | 2 refills | Status: DC

## 2017-04-13 ENCOUNTER — Other Ambulatory Visit: Payer: Self-pay | Admitting: Family Medicine

## 2017-04-19 ENCOUNTER — Ambulatory Visit (INDEPENDENT_AMBULATORY_CARE_PROVIDER_SITE_OTHER): Payer: Medicare HMO

## 2017-04-19 ENCOUNTER — Ambulatory Visit (INDEPENDENT_AMBULATORY_CARE_PROVIDER_SITE_OTHER): Payer: Medicare HMO | Admitting: Family Medicine

## 2017-04-19 VITALS — BP 132/70 | HR 75 | Temp 97.0°F | Ht 61.0 in | Wt 140.0 lb

## 2017-04-19 DIAGNOSIS — S92522A Displaced fracture of medial phalanx of left lesser toe(s), initial encounter for closed fracture: Secondary | ICD-10-CM | POA: Diagnosis not present

## 2017-04-19 DIAGNOSIS — M79672 Pain in left foot: Secondary | ICD-10-CM

## 2017-04-19 NOTE — Progress Notes (Signed)
Chief Complaint  Patient presents with  . Toe Pain    pt here today c/o toe pain on the left foot after "kicking a glider rocking chair" 9 days ago    HPI  Patient presents today for stubbed her toe 9 days ago. Started wearing a post op shoe froma previous injury. Pain & swelling persist.  PMH: Smoking status noted ROS: Per HPI  Objective: BP 132/70   Pulse 75   Temp 97 F (36.1 C) (Oral)   Ht 5\' 1"  (1.549 m)   Wt 140 lb (63.5 kg)   BMI 26.45 kg/m  Gen: NAD, alert, cooperative with exam HEENT: NCAT, EOMI, PERRL CV: RRR, good S1/S2, no murmur Resp: CTABL, no wheezes, non-labored Abd: SNTND, BS present, no guarding or organomegaly Ext: No edema, warm Neuro: Alert and oriented, No gross deficits XR - minimal displacement left 2nd toe proximal to  DIP Assessment and plan:  1. Foot pain, left   2. Closed displaced fracture of middle phalanx of lesser toe of left foot, initial encounter     No orders of the defined types were placed in this encounter.   Orders Placed This Encounter  Procedures  . DG Foot Complete Left    Standing Status:   Future    Number of Occurrences:   1    Standing Expiration Date:   06/19/2018    Order Specific Question:   Reason for Exam (SYMPTOM  OR DIAGNOSIS REQUIRED)    Answer:   pain in toes after injury    Order Specific Question:   Preferred imaging location?    Answer:   Internal   WEar post op shoe a t all times Follow up as needed.  [name], MD
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
## 2017-04-26 ENCOUNTER — Ambulatory Visit (INDEPENDENT_AMBULATORY_CARE_PROVIDER_SITE_OTHER): Payer: Medicare HMO | Admitting: Cardiovascular Disease

## 2017-04-26 ENCOUNTER — Encounter: Payer: Self-pay | Admitting: Cardiovascular Disease

## 2017-04-26 VITALS — BP 146/86 | HR 70 | Ht 61.0 in | Wt 139.0 lb

## 2017-04-26 DIAGNOSIS — I480 Paroxysmal atrial fibrillation: Secondary | ICD-10-CM

## 2017-04-26 NOTE — Patient Instructions (Signed)

## 2017-05-16 ENCOUNTER — Ambulatory Visit (INDEPENDENT_AMBULATORY_CARE_PROVIDER_SITE_OTHER): Payer: Medicare HMO | Admitting: Pharmacist

## 2017-05-16 DIAGNOSIS — I480 Paroxysmal atrial fibrillation: Secondary | ICD-10-CM | POA: Diagnosis not present

## 2017-05-16 LAB — COAGUCHEK XS/INR WAIVED
INR: 3.5 — ABNORMAL HIGH (ref 0.9–1.1)
Prothrombin Time: 42.2 s

## 2017-05-17 ENCOUNTER — Other Ambulatory Visit: Payer: Self-pay | Admitting: Family Medicine

## 2017-05-23 ENCOUNTER — Other Ambulatory Visit: Payer: Self-pay | Admitting: Family Medicine

## 2017-05-23 DIAGNOSIS — N6320 Unspecified lump in the left breast, unspecified quadrant: Secondary | ICD-10-CM

## 2017-05-23 DIAGNOSIS — Z09 Encounter for follow-up examination after completed treatment for conditions other than malignant neoplasm: Secondary | ICD-10-CM

## 2017-05-23 DIAGNOSIS — R928 Other abnormal and inconclusive findings on diagnostic imaging of breast: Secondary | ICD-10-CM

## 2017-05-24 ENCOUNTER — Other Ambulatory Visit: Payer: Self-pay | Admitting: Family Medicine

## 2017-05-25 ENCOUNTER — Ambulatory Visit (INDEPENDENT_AMBULATORY_CARE_PROVIDER_SITE_OTHER): Payer: Medicare HMO | Admitting: Pharmacist

## 2017-05-25 ENCOUNTER — Other Ambulatory Visit: Payer: Self-pay | Admitting: Family Medicine

## 2017-05-25 DIAGNOSIS — I480 Paroxysmal atrial fibrillation: Secondary | ICD-10-CM

## 2017-05-25 DIAGNOSIS — E7849 Other hyperlipidemia: Secondary | ICD-10-CM

## 2017-05-25 LAB — COAGUCHEK XS/INR WAIVED
INR: 2.3 — ABNORMAL HIGH (ref 0.9–1.1)
Prothrombin Time: 27.3 s

## 2017-05-25 NOTE — Progress Notes (Signed)
See anticoagulation note 

## 2017-05-25 NOTE — Patient Instructions (Signed)
Anticoagulation Warfarin Dose Instructions as of 05/25/2017      Norma Barajas Tue Wed Thu Fri Sat   New Dose 3 mg 1.5 mg 3 mg 3 mg 3 mg 1.5 mg 3 mg    Description   Continue current warfarin 3mg  dose - take 1/2 tablet mondays and fridays.  Take 1 tablet all other days.  INR was 2.3  Today (goal is 2.0 to 3.0)

## 2017-05-26 MED ORDER — SIMVASTATIN 40 MG PO TABS: 40.0000 mg | ORAL_TABLET | Freq: Every evening | ORAL | 4 refills | Status: DC

## 2017-05-26 NOTE — Telephone Encounter (Signed)
rx printed and signed

## 2017-05-27 ENCOUNTER — Ambulatory Visit (INDEPENDENT_AMBULATORY_CARE_PROVIDER_SITE_OTHER): Payer: Medicare HMO | Admitting: Family Medicine

## 2017-05-27 ENCOUNTER — Encounter: Payer: Self-pay | Admitting: Family Medicine

## 2017-05-27 VITALS — BP 136/78 | HR 67 | Temp 97.6°F | Ht 61.0 in | Wt 137.0 lb

## 2017-05-27 DIAGNOSIS — K644 Residual hemorrhoidal skin tags: Secondary | ICD-10-CM | POA: Diagnosis not present

## 2017-05-27 NOTE — Progress Notes (Signed)
BP (!) 142/81   Pulse 67   Temp 97.6 F (36.4 C) (Oral)   Ht 5\' 1"  (1.549 m)   Wt 137 lb (62.1 kg)   BMI 25.89 kg/m    Subjective:    Patient ID: Norma Barajas, female    DOB: [birth year], 76 y.o.   MRN: [id number]  HPI: Norma Barajas is a 76 y.o. female presenting on 05/27/2017 for Rectal Bleeding (x 3 days, bright red, history of hemorrhoids)   HPI Rectal bleeding Patient has been having rectal bleeding is been going on since this morning with 2 bowel movements this morning. She had another bowel movement just recently that did not have blood in it. She does admit that she's been having very frequent and looser bowel movements at least 6-7 times a day for the past year since she's been on sotalol from her cardiologist. We discussed her possibly going and talking about changes in medication so that she does not get this over currently with her cardiologist. She denies any pain or itching or burning. She denies any fevers or chills or abdominal pain.  Relevant past medical, surgical, family and social history reviewed and updated as indicated. Interim medical history since our last visit reviewed. Allergies and medications reviewed and updated.  Review of Systems  Constitutional: Negative for chills and fever.  Respiratory: Negative for chest tightness and shortness of breath.   Cardiovascular: Negative for chest pain and leg swelling.  Gastrointestinal: Positive for anal bleeding and diarrhea. Negative for abdominal pain, constipation, nausea and vomiting.  Genitourinary: Negative for menstrual problem.  Musculoskeletal: Negative for back pain and gait problem.  Skin: Negative for rash.  Neurological: Negative for light-headedness and headaches.  Psychiatric/Behavioral: Negative for agitation and behavioral problems.  All other systems reviewed and are negative.   Per HPI unless specifically indicated above        Objective:    BP (!) 142/81   Pulse 67   Temp 97.6 F (36.4  C) (Oral)   Ht 5\' 1"  (1.549 m)   Wt 137 lb (62.1 kg)   BMI 25.89 kg/m   Wt Readings from Last 3 Encounters:  05/27/17 137 lb (62.1 kg)  04/26/17 139 lb (63 kg)  04/19/17 140 lb (63.5 kg)    Physical Exam  Constitutional: She is oriented to person, place, and time. She appears well-developed and well-nourished. No distress.  Eyes: Conjunctivae are normal.  Genitourinary: Rectal exam shows external hemorrhoid, internal hemorrhoid and tenderness. Rectal exam shows no fissure, no mass and anal tone normal.     Musculoskeletal: Normal range of motion. She exhibits no edema or tenderness.  Neurological: She is alert and oriented to person, place, and time. Coordination normal.  Skin: Skin is warm and dry. No rash noted. She is not diaphoretic.  Psychiatric: She has a normal mood and affect. Her behavior is normal.  Nursing note and vitals reviewed.       Assessment & Plan:   Problem List Items Addressed This Visit    None    Visit Diagnoses    External hemorrhoids    -  Primary   Not bleeding today, has had some bowel movements with blood, using hemorrhoid cream just started today, continue cream.       Discussed medication changes as she thinks the sotalol has caused her diarrhea because she's had looser stools ever since she started taking the sotalol one year ago and that is why she is getting recurrent hemorrhoids.  Follow up plan: Return if symptoms worsen or fail to improve.  Counseling provided for all of the vaccine components No orders of the defined types were placed in this encounter.   Caryl Pina, MD [facility] 05/27/2017, 11:09 AM

## 2017-05-30 ENCOUNTER — Encounter: Payer: Self-pay | Admitting: Pharmacist Clinician (PhC)/ Clinical Pharmacy Specialist

## 2017-06-09 ENCOUNTER — Encounter: Payer: Self-pay | Admitting: Gastroenterology

## 2017-06-10 ENCOUNTER — Ambulatory Visit (INDEPENDENT_AMBULATORY_CARE_PROVIDER_SITE_OTHER): Payer: Medicare HMO | Admitting: Pharmacist Clinician (PhC)/ Clinical Pharmacy Specialist

## 2017-06-10 DIAGNOSIS — I480 Paroxysmal atrial fibrillation: Secondary | ICD-10-CM

## 2017-06-10 LAB — COAGUCHEK XS/INR WAIVED
INR: 3.6 — ABNORMAL HIGH (ref 0.9–1.1)
Prothrombin Time: 43.6 s

## 2017-06-10 NOTE — Patient Instructions (Signed)
Anticoagulation Warfarin Dose Instructions as of 06/10/2017      [name] Tue Wed Thu Fri Sat   New Dose 3 mg 1.5 mg 3 mg 1.5 mg 3 mg 1.5 mg 3 mg    Description   No warfarin today. Then follow the above schedule.  INR was 3.6 Today (goal is 2.0 to 3.0)

## 2017-06-15 ENCOUNTER — Telehealth: Payer: Self-pay | Admitting: Family Medicine

## 2017-06-15 NOTE — Telephone Encounter (Signed)
Patient just wanted to switch appts from 8/17 but decided to keep same appt

## 2017-06-24 ENCOUNTER — Encounter: Payer: Self-pay | Admitting: Pharmacist Clinician (PhC)/ Clinical Pharmacy Specialist

## 2017-06-24 ENCOUNTER — Ambulatory Visit (INDEPENDENT_AMBULATORY_CARE_PROVIDER_SITE_OTHER): Payer: Medicare HMO | Admitting: Pharmacist Clinician (PhC)/ Clinical Pharmacy Specialist

## 2017-06-24 DIAGNOSIS — I480 Paroxysmal atrial fibrillation: Secondary | ICD-10-CM | POA: Diagnosis not present

## 2017-06-24 LAB — COAGUCHEK XS/INR WAIVED
INR: 2.7 — AB (ref 0.9–1.1)
PROTHROMBIN TIME: 32.6 s

## 2017-06-24 NOTE — Patient Instructions (Signed)
Anticoagulation Warfarin Dose Instructions as of 06/24/2017      Norma Barajas Tue Wed Thu Fri Sat   New Dose 3 mg 1.5 mg 3 mg 1.5 mg 3 mg 1.5 mg 3 mg    Description   Continue taking warfarin the same way   INR was 2.7 Today (goal is 2.0 to 3.0)

## 2017-06-27 ENCOUNTER — Other Ambulatory Visit: Payer: Self-pay | Admitting: Family Medicine

## 2017-06-27 DIAGNOSIS — Z780 Asymptomatic menopausal state: Secondary | ICD-10-CM

## 2017-07-05 ENCOUNTER — Encounter (HOSPITAL_COMMUNITY): Payer: Medicare HMO

## 2017-07-08 ENCOUNTER — Ambulatory Visit: Payer: Medicare HMO | Admitting: Gastroenterology

## 2017-07-14 ENCOUNTER — Other Ambulatory Visit: Payer: Self-pay | Admitting: Family Medicine

## 2017-07-14 ENCOUNTER — Ambulatory Visit (INDEPENDENT_AMBULATORY_CARE_PROVIDER_SITE_OTHER): Payer: Medicare HMO

## 2017-07-14 DIAGNOSIS — Z780 Asymptomatic menopausal state: Secondary | ICD-10-CM

## 2017-07-14 DIAGNOSIS — F411 Generalized anxiety disorder: Secondary | ICD-10-CM

## 2017-07-18 ENCOUNTER — Other Ambulatory Visit: Payer: Self-pay | Admitting: Family Medicine

## 2017-07-18 NOTE — Telephone Encounter (Signed)
Rx called to pharmacy

## 2017-07-19 ENCOUNTER — Ambulatory Visit (INDEPENDENT_AMBULATORY_CARE_PROVIDER_SITE_OTHER): Payer: Medicare HMO | Admitting: Family Medicine

## 2017-07-19 ENCOUNTER — Encounter: Payer: Self-pay | Admitting: Family Medicine

## 2017-07-19 VITALS — BP 127/73 | HR 66 | Temp 97.3°F | Ht 61.0 in | Wt 138.0 lb

## 2017-07-19 DIAGNOSIS — M810 Age-related osteoporosis without current pathological fracture: Secondary | ICD-10-CM | POA: Diagnosis not present

## 2017-07-19 MED ORDER — RALOXIFENE HCL 60 MG PO TABS: 60.0000 mg | ORAL_TABLET | Freq: Every day | ORAL | 5 refills | Status: DC

## 2017-07-19 NOTE — Patient Instructions (Signed)
Other prescriptions options are:  Fosamax-once weekly Prolia-injection every 6 months Forteo-daily injection

## 2017-07-19 NOTE — Progress Notes (Signed)
Chief Complaint  Patient presents with  . Results    pt here today to discuss DEXA and treatment options    HPI  Patient presents today for Discussion of bone density. She had a scan last week. She relates that she does not have back pain or hip pain. However, she notes that she has lost 4 or 5 inches of height in the last few years. She says that she took Fosamax many years ago but it got too expensive. She thinks that was probably before it went generic.  PMH: Smoking status noted ROS: Per HPI  Objective: BP 127/73   Pulse 66   Temp (!) 97.3 F (36.3 C) (Oral)   Ht 5\' 1"  (1.549 m)   Wt 138 lb (62.6 kg)   BMI 26.07 kg/m  Gen: NAD, alert, cooperative with exam HEENT: NCAT, EOMI, PERRL CV: RRR, good S1/S2, no murmur Resp: CTABL, no wheezes, non-labored Neuro: Alert and oriented, No gross deficits  Assessment and plan:  1. Age-related osteoporosis without current pathological fracture     Meds ordered this encounter  Medications  . raloxifene (EVISTA) 60 MG tablet    Sig: Take 1 tablet (60 mg total) by mouth daily.    Dispense:  30 tablet    Refill:  5    Over 15 min was spent with Pt. More than 1/2 in counseling regarding risks of osteoporosis. Treatmetn options reviewed including benefits and adverse effects  Follow up 6 mos.[name], MD

## 2017-07-29 ENCOUNTER — Ambulatory Visit: Payer: Medicare HMO | Admitting: Gastroenterology

## 2017-08-05 ENCOUNTER — Ambulatory Visit (INDEPENDENT_AMBULATORY_CARE_PROVIDER_SITE_OTHER): Payer: Medicare HMO | Admitting: Pharmacist Clinician (PhC)/ Clinical Pharmacy Specialist

## 2017-08-05 DIAGNOSIS — I4891 Unspecified atrial fibrillation: Secondary | ICD-10-CM

## 2017-08-05 LAB — COAGUCHEK XS/INR WAIVED
INR: 3.1 — AB (ref 0.9–1.1)
PROTHROMBIN TIME: 37.1 s

## 2017-08-05 NOTE — Patient Instructions (Signed)
Anticoagulation Warfarin Dose Instructions as of 08/05/2017      [name] Tue Wed Thu Fri Sat   New Dose 3 mg 1.5 mg 3 mg 1.5 mg 3 mg 1.5 mg 3 mg    Description   Take 1/2 tablet tomorrow then Continue taking warfarin the same way   INR was 3.1 Today (goal is 2.0 to 3.0)

## 2017-08-22 ENCOUNTER — Ambulatory Visit (INDEPENDENT_AMBULATORY_CARE_PROVIDER_SITE_OTHER): Payer: Medicare HMO

## 2017-08-22 DIAGNOSIS — Z23 Encounter for immunization: Secondary | ICD-10-CM | POA: Diagnosis not present

## 2017-08-24 ENCOUNTER — Other Ambulatory Visit: Payer: Medicare HMO

## 2017-08-24 DIAGNOSIS — E782 Mixed hyperlipidemia: Secondary | ICD-10-CM

## 2017-08-24 DIAGNOSIS — I10 Essential (primary) hypertension: Secondary | ICD-10-CM

## 2017-08-25 LAB — CBC WITH DIFFERENTIAL/PLATELET
BASOS ABS: 0 10*3/uL (ref 0.0–0.2)
BASOS: 1 %
EOS (ABSOLUTE): 0.1 10*3/uL (ref 0.0–0.4)
Eos: 2 %
HEMATOCRIT: 44 % (ref 34.0–46.6)
HEMOGLOBIN: 14.9 g/dL (ref 11.1–15.9)
IMMATURE GRANS (ABS): 0 10*3/uL (ref 0.0–0.1)
Immature Granulocytes: 0 %
LYMPHS: 34 %
Lymphocytes Absolute: 1.6 10*3/uL (ref 0.7–3.1)
MCH: 31.5 pg (ref 26.6–33.0)
MCHC: 33.9 g/dL (ref 31.5–35.7)
MCV: 93 fL (ref 79–97)
MONOCYTES: 12 %
Monocytes Absolute: 0.6 10*3/uL (ref 0.1–0.9)
NEUTROS ABS: 2.4 10*3/uL (ref 1.4–7.0)
Neutrophils: 51 %
Platelets: 170 10*3/uL (ref 150–379)
RBC: 4.73 x10E6/uL (ref 3.77–5.28)
RDW: 14 % (ref 12.3–15.4)
WBC: 4.8 10*3/uL (ref 3.4–10.8)

## 2017-08-25 LAB — BMP8+EGFR
BUN/Creatinine Ratio: 13 (ref 12–28)
BUN: 9 mg/dL (ref 8–27)
CHLORIDE: 102 mmol/L (ref 96–106)
CO2: 24 mmol/L (ref 20–29)
CREATININE: 0.72 mg/dL (ref 0.57–1.00)
Calcium: 9 mg/dL (ref 8.7–10.3)
GFR calc non Af Amer: 80 mL/min/{1.73_m2} (ref >59)
GFR, EST AFRICAN AMERICAN: 93 mL/min/{1.73_m2} (ref >59)
GLUCOSE: 94 mg/dL (ref 65–99)
POTASSIUM: 4 mmol/L (ref 3.5–5.2)
SODIUM: 145 mmol/L — AB (ref 134–144)

## 2017-08-25 LAB — LIPID PANEL
CHOL/HDL RATIO: 4.3 ratio (ref 0.0–4.4)
Cholesterol, Total: 171 mg/dL (ref 100–199)
HDL: 40 mg/dL (ref >39)
LDL Calculated: 80 mg/dL (ref 0–99)
TRIGLYCERIDES: 255 mg/dL — AB (ref 0–149)
VLDL Cholesterol Cal: 51 mg/dL — ABNORMAL HIGH (ref 5–40)

## 2017-08-30 ENCOUNTER — Ambulatory Visit: Payer: Medicare HMO | Admitting: Family Medicine

## 2017-08-31 ENCOUNTER — Ambulatory Visit (INDEPENDENT_AMBULATORY_CARE_PROVIDER_SITE_OTHER): Payer: Medicare HMO | Admitting: Family Medicine

## 2017-08-31 ENCOUNTER — Encounter: Payer: Self-pay | Admitting: Family Medicine

## 2017-08-31 VITALS — BP 137/79 | HR 72 | Temp 97.3°F | Ht 61.0 in | Wt 138.0 lb

## 2017-08-31 DIAGNOSIS — K219 Gastro-esophageal reflux disease without esophagitis: Secondary | ICD-10-CM | POA: Diagnosis not present

## 2017-08-31 DIAGNOSIS — E782 Mixed hyperlipidemia: Secondary | ICD-10-CM | POA: Diagnosis not present

## 2017-08-31 DIAGNOSIS — I10 Essential (primary) hypertension: Secondary | ICD-10-CM

## 2017-08-31 DIAGNOSIS — I4819 Other persistent atrial fibrillation: Secondary | ICD-10-CM

## 2017-08-31 DIAGNOSIS — I481 Persistent atrial fibrillation: Secondary | ICD-10-CM

## 2017-08-31 LAB — COAGUCHEK XS/INR WAIVED
INR: 3.1 — AB (ref 0.9–1.1)
PROTHROMBIN TIME: 37.4 s

## 2017-08-31 NOTE — Progress Notes (Signed)
BP 137/79   Pulse 72   Temp (!) 97.3 F (36.3 C) (Oral)   Ht 5' 1" (1.549 m)   Wt 138 lb (62.6 kg)   BMI 26.07 kg/m    Subjective:    Patient ID: Norma Barajas, female    DOB: 20-Mar-1941, 76 y.o.   MRN: [id number]  HPI: Norma Barajas is a 76 y.o. female presenting on 08/31/2017 for Hyperlipidemia (follow up); Hypertension; and Atrial Fibrillation (check PT/INR)   HPI Hypertension Patient is currently on Norvasc and metoprolol and sotalol, and their blood pressure today is 137/79. Patient denies any lightheadedness or dizziness. Patient denies headaches, blurred vision, chest pains, shortness of breath, or weakness. Denies any side effects from medication and is content with current medication.   Hyperlipidemia Patient is coming in for recheck of his hyperlipidemia. The patient is currently taking simvastatin. They deny any issues with myalgias or history of liver damage from it. They deny any focal numbness or weakness or chest pain.   GERD Patient is currently on omeprazole.  She denies any major symptoms or abdominal pain or belching or burping. She denies any blood in her stool or lightheadedness or dizziness.   A. fib and Coumadin recheck  Patient is coming in for Coumadin rechecked today.  She denies any chest pain or palpitations.  She denies any bleeding or bruising anywhere.  One month ago she had a check in was 3.1 and today it is 3.1 again.  We will adjust her dosing slightly and see if that can get her where she needs to be.  Relevant past medical, surgical, family and social history reviewed and updated as indicated. Interim medical history since our last visit reviewed. Allergies and medications reviewed and updated.  Review of Systems  Constitutional: Negative for chills and fever.  Eyes: Negative for visual disturbance.  Respiratory: Negative for chest tightness and shortness of breath.   Cardiovascular: Negative for chest pain and leg swelling.  Gastrointestinal:  Negative for blood in stool.  Musculoskeletal: Negative for back pain and gait problem.  Skin: Negative for rash.  Neurological: Negative for dizziness, weakness, light-headedness and numbness.  Psychiatric/Behavioral: Negative for agitation and behavioral problems.  All other systems reviewed and are negative.   Per HPI unless specifically indicated above   Allergies as of 08/31/2017      Reactions   Triamterene-hctz Other (See Comments)   weakness   Atorvastatin Other (See Comments)   Myalgias   Crestor [rosuvastatin Calcium] Other (See Comments)   weakness   Morphine Nausea Only   Risedronate Sodium Other (See Comments)   ACTONEL REACTION: reflux      Medication List       Accurate as of 08/31/17 11:37 AM. Always use your most recent med list.          ALPRAZolam 0.25 MG tablet Commonly known as:  XANAX Take 1 Tablet by mouth 2 times a day   amLODipine 10 MG tablet Commonly known as:  NORVASC TAKE 1 TABLET EVERY DAY   cholecalciferol 1000 units tablet Commonly known as:  VITAMIN D Take 1,000 Units by mouth daily.   metoprolol tartrate 25 MG tablet Commonly known as:  LOPRESSOR Take 0.5 tablets (12.5 mg total) by mouth 2 (two) times daily.   multivitamin capsule Take 1 capsule by mouth every evening.   omeprazole 20 MG capsule Commonly known as:  PRILOSEC TAKE 1 CAPSULE (20 MG TOTAL) BY MOUTH DAILY.   simvastatin 40 MG tablet Commonly  known as:  ZOCOR Take 1 tablet (40 mg total) by mouth every evening.   sotalol 80 MG tablet Commonly known as:  BETAPACE TAKE 1 TABLET (80 MG TOTAL) BY MOUTH DAILY.   warfarin 3 MG tablet Commonly known as:  COUMADIN TAKE 1/2 TO 1 TABLET EVERY DAY AS DIRECTED  BY  ANTICOAGULATION  CLINIC          Objective:    BP 137/79   Pulse 72   Temp (!) 97.3 F (36.3 C) (Oral)   Ht 5' 1" (1.549 m)   Wt 138 lb (62.6 kg)   BMI 26.07 kg/m   Wt Readings from Last 3 Encounters:  08/31/17 138 lb (62.6 kg)  07/19/17 138  lb (62.6 kg)  05/27/17 137 lb (62.1 kg)    Physical Exam  Constitutional: She is oriented to person, place, and time. She appears well-developed and well-nourished. No distress.  Eyes: Conjunctivae are normal.  Neck: Neck supple. No thyromegaly present.  Cardiovascular: Normal rate, normal heart sounds and intact distal pulses.  An irregularly irregular rhythm present.  No murmur heard. Pulmonary/Chest: Effort normal and breath sounds normal. No respiratory distress. She has no wheezes. She has no rales.  Musculoskeletal: Normal range of motion. She exhibits no edema.  Lymphadenopathy:    She has no cervical adenopathy.  Neurological: She is alert and oriented to person, place, and time. Coordination normal.  Skin: Skin is warm and dry. No rash noted. She is not diaphoretic.  Psychiatric: She has a normal mood and affect. Her behavior is normal.  Nursing note and vitals reviewed.   Results for orders placed or performed in visit on 08/24/17  CBC with Differential/Platelet  Result Value Ref Range   WBC 4.8 3.4 - 10.8 x10E3/uL   RBC 4.73 3.77 - 5.28 x10E6/uL   Hemoglobin 14.9 11.1 - 15.9 g/dL   Hematocrit 44.0 34.0 - 46.6 %   MCV 93 79 - 97 fL   MCH 31.5 26.6 - 33.0 pg   MCHC 33.9 31.5 - 35.7 g/dL   RDW 14.0 12.3 - 15.4 %   Platelets 170 150 - 379 x10E3/uL   Neutrophils 51 Not Estab. %   Lymphs 34 Not Estab. %   Monocytes 12 Not Estab. %   Eos 2 Not Estab. %   Basos 1 Not Estab. %   Neutrophils Absolute 2.4 1.4 - 7.0 x10E3/uL   Lymphocytes Absolute 1.6 0.7 - 3.1 x10E3/uL   Monocytes Absolute 0.6 0.1 - 0.9 x10E3/uL   EOS (ABSOLUTE) 0.1 0.0 - 0.4 x10E3/uL   Basophils Absolute 0.0 0.0 - 0.2 x10E3/uL   Immature Granulocytes 0 Not Estab. %   Immature Grans (Abs) 0.0 0.0 - 0.1 x10E3/uL  BMP8+EGFR  Result Value Ref Range   Glucose 94 65 - 99 mg/dL   BUN 9 8 - 27 mg/dL   Creatinine, Ser 0.72 0.57 - 1.00 mg/dL   GFR calc non Af Amer 80 >59 mL/min/1.73   GFR calc Af Amer 93 >59  mL/min/1.73   BUN/Creatinine Ratio 13 12 - 28   Sodium 145 (H) 134 - 144 mmol/L   Potassium 4.0 3.5 - 5.2 mmol/L   Chloride 102 96 - 106 mmol/L   CO2 24 20 - 29 mmol/L   Calcium 9.0 8.7 - 10.3 mg/dL  Lipid panel  Result Value Ref Range   Cholesterol, Total 171 100 - 199 mg/dL   Triglycerides 255 (H) 0 - 149 mg/dL   HDL 40 >39 mg/dL  VLDL Cholesterol Cal 51 (H) 5 - 40 mg/dL   LDL Calculated 80 0 - 99 mg/dL   Chol/HDL Ratio 4.3 0.0 - 4.4 ratio   Anticoagulation Warfarin Dose Instructions as of 08/31/2017      [name] Tue Wed Thu Fri Sat   New Dose 1.5 mg 3 mg 1.5 mg 3 mg 1.5 mg 3 mg 1.5 mg    Description   Take 1/2 tablet 0.5 mg on Sundays Tuesdays Thursdays and Saturdays and 1 mg the rest of the days  INR was 3.1 Today (goal is 2.0 to 3.0)         Assessment & Plan:   Problem List Items Addressed This Visit      Cardiovascular and Mediastinum   Essential hypertension   Atrial fibrillation ([place]) - Primary   Relevant Orders   CoaguChek XS/INR Waived     Digestive   GERD     Other   Hyperlipidemia     Keep current medications except for a slight adjustment on the Coumadin.  We will follow-up in 6 months with [place] in 1 month for Coumadin.  Follow up plan: Return in about 6 months (around 03/01/2018), or if symptoms worsen or fail to improve, for Return to me in 6 months but get monthly Coumadin checks.  Counseling provided for all of the vaccine components Orders Placed This Encounter  Procedures  . CoaguChek XS/INR [place], MD [facility] 08/31/2017, 11:37 AM

## 2017-08-31 NOTE — Patient Instructions (Signed)
Anticoagulation Warfarin Dose Instructions as of 08/31/2017      Norma Barajas Tue Wed Thu Fri Sat   New Dose 1.5 mg 3 mg 1.5 mg 3 mg 1.5 mg 3 mg 1.5 mg    Description   Take 1/2 tablet 0.5 mg on Sundays Tuesdays Thursdays and Saturdays and 1 mg the rest of the days  INR was 3.1 Today (goal is 2.0 to 3.0)

## 2017-09-23 ENCOUNTER — Ambulatory Visit: Payer: Medicare HMO | Admitting: Pharmacist Clinician (PhC)/ Clinical Pharmacy Specialist

## 2017-09-23 DIAGNOSIS — I4891 Unspecified atrial fibrillation: Secondary | ICD-10-CM | POA: Diagnosis not present

## 2017-09-23 LAB — COAGUCHEK XS/INR WAIVED
INR: 1.8 — ABNORMAL HIGH (ref 0.9–1.1)
Prothrombin Time: 21.8 s

## 2017-09-23 NOTE — Patient Instructions (Signed)
Description   Take 1/2 tablet 0.5 mg on Sundays, Tuesdays Thursdays and 1 mg the rest of the days  INR was 1.8 Today (goal is 2.0 to 3.0)  Little thick today

## 2017-09-27 ENCOUNTER — Other Ambulatory Visit: Payer: Self-pay | Admitting: Family Medicine

## 2017-10-14 ENCOUNTER — Ambulatory Visit (INDEPENDENT_AMBULATORY_CARE_PROVIDER_SITE_OTHER): Payer: Medicare HMO | Admitting: Pharmacist Clinician (PhC)/ Clinical Pharmacy Specialist

## 2017-10-14 DIAGNOSIS — I4891 Unspecified atrial fibrillation: Secondary | ICD-10-CM

## 2017-10-14 LAB — COAGUCHEK XS/INR WAIVED
INR: 2 — ABNORMAL HIGH (ref 0.9–1.1)
PROTHROMBIN TIME: 23.5 s

## 2017-10-14 NOTE — Patient Instructions (Signed)
Description   Take 1/2 tablet 0.5 mg on Sundays, Tuesdays Thursdays and 1 mg the rest of the days  INR was 2.0 Today (goal is 2.0 to 3.0)

## 2017-10-21 ENCOUNTER — Encounter: Payer: Self-pay | Admitting: Pharmacist Clinician (PhC)/ Clinical Pharmacy Specialist

## 2017-10-26 ENCOUNTER — Ambulatory Visit (INDEPENDENT_AMBULATORY_CARE_PROVIDER_SITE_OTHER): Payer: Medicare HMO | Admitting: Pediatrics

## 2017-10-26 ENCOUNTER — Encounter: Payer: Self-pay | Admitting: Pediatrics

## 2017-10-26 ENCOUNTER — Ambulatory Visit (INDEPENDENT_AMBULATORY_CARE_PROVIDER_SITE_OTHER): Payer: Medicare HMO

## 2017-10-26 VITALS — BP 136/85 | HR 75 | Temp 96.9°F | Ht 61.0 in | Wt 140.0 lb

## 2017-10-26 DIAGNOSIS — R05 Cough: Secondary | ICD-10-CM

## 2017-10-26 DIAGNOSIS — R059 Cough, unspecified: Secondary | ICD-10-CM

## 2017-10-26 DIAGNOSIS — J069 Acute upper respiratory infection, unspecified: Secondary | ICD-10-CM

## 2017-10-26 DIAGNOSIS — R911 Solitary pulmonary nodule: Secondary | ICD-10-CM

## 2017-10-26 MED ORDER — FUROSEMIDE 20 MG PO TABS: 20.0000 mg | ORAL_TABLET | Freq: Every day | ORAL | 0 refills | Status: DC | PRN

## 2017-10-26 NOTE — Progress Notes (Signed)
  Subjective:   Patient ID: Norma Barajas, female    DOB: [birth year], 76 y.o.   MRN: [id number] CC: Diarrhea (pt here today c/o diarrhea and "rattling in her chest")  HPI: Norma Barajas is a 76 y.o. female presenting for Diarrhea (pt here today c/o diarrhea and "rattling in her chest")  3 episodes loose stools yesterday, once today OTC med helps some  No accidents  Most bothered by a "rattle" in her chest that comes and goes Can come on when she is sitting, lying down, position does not matter Coughing some, sometimes coughing things up Chest feels a little bit tight at times No chest pain with walking No shortness of breath, regular exercise tolerance Has had some more sinus drainage in the last few days  Here today with her husband  Relevant past medical, surgical, family and social history reviewed. Allergies and medications reviewed and updated. Social History   Tobacco Use  Smoking Status Never Smoker  Smokeless Tobacco Never Used   ROS: Per HPI   Objective:    BP 136/85   Pulse 75   Temp (!) 96.9 F (36.1 C) (Oral)   Ht 5\' 1"  (1.549 m)   Wt 140 lb (63.5 kg)   SpO2 94%   BMI 26.45 kg/m   Wt Readings from Last 3 Encounters:  10/26/17 140 lb (63.5 kg)  08/31/17 138 lb (62.6 kg)  07/19/17 138 lb (62.6 kg)    Gen: NAD, alert, cooperative with exam, NCAT EYES: EOMI, no conjunctival injection, or no icterus ENT:  TMs pearly gray b/l, OP without erythema LYMPH: no cervical LAD CV: NRRR, normal S1/S2, no murmur, distal pulses 2+ b/l Resp: CTABL, no wheezes, normal WOB Abd: +BS, soft, NTND. no guarding or organomegaly Ext: No edema, warm Neuro: Alert and oriented  Chest x-ray: Final read with new opacity, recommended CT scan for further evaluation  Assessment & Plan:  Norma Barajas was seen today for cough.  Diagnoses and all orders for this visit:  Cough Return precautions discussed, patient with no fevers, minimally productive cough Suspect due to acute  URI Incidental finding of opacity on chest x-ray CT scan ordered, patient aware -     DG Chest 2 View; Future  Other orders -     furosemide (LASIX) 20 MG tablet; Take 1 tablet (20 mg total) by mouth daily as needed.  I spent 25 minutes with the patient with over 50% of the encounter time dedicated to counseling on the above problems.  Follow up plan: Return if symptoms worsen or fail to improve. Assunta Found, MD [place]

## 2017-11-11 ENCOUNTER — Encounter: Payer: Self-pay | Admitting: *Deleted

## 2017-11-16 ENCOUNTER — Other Ambulatory Visit: Payer: Self-pay | Admitting: Family Medicine

## 2017-11-16 ENCOUNTER — Other Ambulatory Visit: Payer: Self-pay | Admitting: Cardiovascular Disease

## 2017-11-22 ENCOUNTER — Ambulatory Visit (HOSPITAL_COMMUNITY): Payer: Medicare HMO

## 2017-11-23 ENCOUNTER — Ambulatory Visit (HOSPITAL_COMMUNITY)
Admission: RE | Admit: 2017-11-23 | Discharge: 2017-11-23 | Disposition: A | Discharge status: Home / Self Care | Payer: Medicare HMO | Source: Ambulatory Visit | Attending: Pediatrics | Admitting: Pediatrics

## 2017-11-23 DIAGNOSIS — R911 Solitary pulmonary nodule: Secondary | ICD-10-CM | POA: Diagnosis not present

## 2017-11-23 DIAGNOSIS — R918 Other nonspecific abnormal finding of lung field: Secondary | ICD-10-CM | POA: Diagnosis not present

## 2017-11-23 DIAGNOSIS — I700 Atherosclerosis of aorta: Secondary | ICD-10-CM | POA: Insufficient documentation

## 2017-11-24 ENCOUNTER — Telehealth: Payer: Self-pay | Admitting: Family Medicine

## 2017-11-24 NOTE — Telephone Encounter (Signed)
Moved to lab pool  °

## 2017-12-02 ENCOUNTER — Ambulatory Visit (INDEPENDENT_AMBULATORY_CARE_PROVIDER_SITE_OTHER): Payer: Medicare HMO | Admitting: Pharmacist Clinician (PhC)/ Clinical Pharmacy Specialist

## 2017-12-02 DIAGNOSIS — I4891 Unspecified atrial fibrillation: Secondary | ICD-10-CM | POA: Diagnosis not present

## 2017-12-02 LAB — COAGUCHEK XS/INR WAIVED
INR: 2.4 — AB (ref 0.9–1.1)
PROTHROMBIN TIME: 28.5 s

## 2017-12-02 NOTE — Patient Instructions (Signed)
Description   Take 1/2 tablet 0.5 mg on Sundays, Tuesdays Thursdays and 1 mg the rest of the days  INR was 2.4 Today (goal is 2.0 to 3.0)

## 2017-12-07 ENCOUNTER — Ambulatory Visit (INDEPENDENT_AMBULATORY_CARE_PROVIDER_SITE_OTHER): Payer: Medicare HMO | Admitting: Family Medicine

## 2017-12-07 VITALS — BP 151/92 | HR 83 | Temp 97.0°F

## 2017-12-07 DIAGNOSIS — S098XXA Other specified injuries of head, initial encounter: Secondary | ICD-10-CM

## 2017-12-07 DIAGNOSIS — R42 Dizziness and giddiness: Secondary | ICD-10-CM

## 2017-12-07 DIAGNOSIS — S0003XA Contusion of scalp, initial encounter: Secondary | ICD-10-CM

## 2017-12-07 NOTE — Progress Notes (Signed)
Chief Complaint  Patient presents with  . Fall    Patient states she lost her balance and had a fall this morning.  Hit her head on the side of a table and has a laceration behind right ear    HPI  Patient presents today for laceration behind the right ear.  She was going up the steps when she went to turn to respond to her husband speaking to her she felt off balance and fell hitting a small end table with the mastoid area of her right ear.  They were unable to figure out how to stop bleeding and bandage it so they came in.  She denies any loss of consciousness.  She denies any pain at this point.  He says that she does occasionally have some lightheadedness when she turns her head quickly.  This is nothing new.  This is not affected her hearing.  She denies injury elsewhere.  She landed on her right side but denies pain of the right hip leg or flank.  She is just a little shaken up.  PMH: Smoking status noted Review of Systems  Constitutional: Negative for fever.  HENT: Negative for congestion, rhinorrhea and sore throat.   Eyes: Negative for visual disturbance.  Respiratory: Negative for cough and shortness of breath.   Cardiovascular: Negative for chest pain and palpitations.  Gastrointestinal: Negative for abdominal pain, diarrhea and nausea.  Genitourinary: Negative for dysuria.  Musculoskeletal: Negative for arthralgias and myalgias.  Neurological: Positive for dizziness and light-headedness. Negative for syncope, facial asymmetry, weakness, numbness and headaches.  Psychiatric/Behavioral: Negative for agitation and confusion.     Objective: BP (!) 151/92   Pulse 83   Temp (!) 97 F (36.1 C) (Oral)   SpO2 96%  Gen: NAD, alert, cooperative with exam HEENT: NCAT, EOMI, PERRL.  The right postauricular area has a 1.5 x 0.5 cm skin tear.  This is into the dermis but not the subcutaneous tissue.  It is not bleeding.  There is a bit of dried blood surrounding this.  There is no battle  sign.  The TMs are clear.  Pharynx without erythema CV: RRR, good S1/S2, no murmur Resp: CTABL, no wheezes, non-labored Abd: SNTND, BS present, no guarding or organomegaly Ext: No edema, warm Neuro: Alert and oriented, No gross deficits  Assessment and plan:  1. Contusion of scalp, initial encounter   2. Blunt head trauma, initial encounter   3. Dizziness     Wound care was reviewed.  Dressed daily after cleaning with warm soapy water rinse with clear water and applying bacitracin.  Initial bandage placed here and demonstrated to her husband who is in attendance.  Signs and symptoms of infection were reviewed as well.  Blunt head trauma instructions were also reviewed with regard to formation of a subdural hematoma over the next several days.  Watch for changes in level of consciousness irritability etc.  Follow up as needed.  [name], MD

## 2017-12-28 ENCOUNTER — Ambulatory Visit (INDEPENDENT_AMBULATORY_CARE_PROVIDER_SITE_OTHER): Payer: Medicare HMO | Admitting: Family Medicine

## 2017-12-28 ENCOUNTER — Encounter: Payer: Self-pay | Admitting: Family Medicine

## 2017-12-28 VITALS — BP 140/80 | HR 74 | Temp 97.0°F | Ht 61.0 in | Wt 142.0 lb

## 2017-12-28 DIAGNOSIS — J40 Bronchitis, not specified as acute or chronic: Secondary | ICD-10-CM | POA: Diagnosis not present

## 2017-12-28 MED ORDER — FLUTICASONE PROPIONATE 50 MCG/ACT NA SUSP: 1.0000 | Freq: Two times a day (BID) | NASAL | 6 refills | Status: DC | PRN

## 2017-12-28 MED ORDER — LORATADINE 10 MG PO TABS: 10.0000 mg | ORAL_TABLET | Freq: Every day | ORAL | 11 refills | Status: DC

## 2017-12-28 NOTE — Progress Notes (Signed)
BP (!) 144/83   Pulse 74   Temp (!) 97 F (36.1 C) (Oral)   Ht 5\' 1"  (1.549 m)   Wt 142 lb (64.4 kg)   BMI 26.83 kg/m    Subjective:    Patient ID: [name], female    DOB: Mar 25, 1941, 77 y.o.   MRN: [id number]  HPI: Norma Barajas is a 77 y.o. female presenting on 12/28/2017 for Cough, sinus drainage (non productive, for over a month)   HPI Sinus drainage and cough Patient has been having sinus drainage and cough and congestion and wheezing that is been going on for 1 month.  She still feels congested in her chest and still feels like she is having some wheezing.  She says the symptoms are a lot worse at night and she gets a lot of drainage on the back of her throat at night as well.  She denies any fevers or chills.  Her cough is been always nonproductive.  She does occasionally have a sore throat and the sinus drainage and the sneezing.  Relevant past medical, surgical, family and social history reviewed and updated as indicated. Interim medical history since our last visit reviewed. Allergies and medications reviewed and updated.  Review of Systems  Constitutional: Negative for chills and fever.  HENT: Positive for congestion, postnasal drip, rhinorrhea, sinus pressure, sneezing and sore throat. Negative for ear discharge and ear pain.   Eyes: Negative for pain, redness and visual disturbance.  Respiratory: Positive for cough and wheezing. Negative for chest tightness and shortness of breath.   Cardiovascular: Negative for chest pain and leg swelling.  Genitourinary: Negative for difficulty urinating and dysuria.  Musculoskeletal: Negative for back pain and gait problem.  Skin: Negative for rash.  Neurological: Negative for light-headedness and headaches.  Psychiatric/Behavioral: Negative for agitation and behavioral problems.  All other systems reviewed and are negative.   Per HPI unless specifically indicated above   Allergies as of 12/28/2017      Reactions   Triamterene-hctz Other (See Comments)   weakness   Atorvastatin Other (See Comments)   Myalgias   Crestor [rosuvastatin Calcium] Other (See Comments)   weakness   Morphine Nausea Only   Risedronate Sodium Other (See Comments)   ACTONEL REACTION: reflux      Medication List        Accurate as of 12/28/17  4:20 PM. Always use your most recent med list.          ALPRAZolam 0.25 MG tablet Commonly known as:  XANAX Take 1 Tablet by mouth 2 times a day   amLODipine 10 MG tablet Commonly known as:  NORVASC TAKE 1 TABLET EVERY DAY   cholecalciferol 1000 units tablet Commonly known as:  VITAMIN D Take 1,000 Units by mouth daily.   furosemide 20 MG tablet Commonly known as:  LASIX Take 1 tablet (20 mg total) by mouth daily as needed.   metoprolol tartrate 25 MG tablet Commonly known as:  LOPRESSOR TAKE 1/2 TABLET TWICE DAILY   multivitamin capsule Take 1 capsule by mouth every evening.   omeprazole 20 MG capsule Commonly known as:  PRILOSEC TAKE 1 CAPSULE EVERY DAY   simvastatin 40 MG tablet Commonly known as:  ZOCOR Take 1 tablet (40 mg total) by mouth every evening.   sotalol 80 MG tablet Commonly known as:  BETAPACE TAKE 1 TABLET (80 MG TOTAL) BY MOUTH DAILY.   warfarin 3 MG tablet Commonly known as:  COUMADIN Take as directed  by the anticoagulation clinic. If you are unsure how to take this medication, talk to your nurse or doctor. Original instructions:  TAKE 1/2 TO 1 TABLET EVERY DAY AS DIRECTED  BY  ANTICOAGULATION  CLINIC          Objective:    BP (!) 144/83   Pulse 74   Temp (!) 97 F (36.1 C) (Oral)   Ht 5\' 1"  (1.549 m)   Wt 142 lb (64.4 kg)   BMI 26.83 kg/m   Wt Readings from Last 3 Encounters:  12/28/17 142 lb (64.4 kg)  10/26/17 140 lb (63.5 kg)  08/31/17 138 lb (62.6 kg)    Physical Exam  Constitutional: She is oriented to person, place, and time. She appears well-developed and well-nourished. No distress.  HENT:  Right Ear: Tympanic  membrane, external ear and ear canal normal.  Left Ear: Tympanic membrane, external ear and ear canal normal.  Nose: Mucosal edema and rhinorrhea present. No epistaxis. Right sinus exhibits no maxillary sinus tenderness and no frontal sinus tenderness. Left sinus exhibits no maxillary sinus tenderness and no frontal sinus tenderness.  Mouth/Throat: Uvula is midline and mucous membranes are normal. Posterior oropharyngeal edema and posterior oropharyngeal erythema present. No oropharyngeal exudate or tonsillar abscesses.  Eyes: Conjunctivae and EOM are normal.  Neck: Neck supple. No thyromegaly present.  Cardiovascular: Normal rate, regular rhythm, normal heart sounds and intact distal pulses.  No murmur heard. Pulmonary/Chest: Effort normal and breath sounds normal. No respiratory distress. She has no wheezes. She has no rales.  Musculoskeletal: Normal range of motion. She exhibits no edema or tenderness.  Lymphadenopathy:    She has no cervical adenopathy.  Neurological: She is alert and oriented to person, place, and time. Coordination normal.  Skin: Skin is warm and dry. No rash noted. She is not diaphoretic.  Psychiatric: She has a normal mood and affect. Her behavior is normal.  Vitals reviewed.       Assessment & Plan:   Problem List Items Addressed This Visit    None    Visit Diagnoses    Bronchitis    -  Primary   Will try sample of Brio and recommend for her to try an allergy medication.  Normal CT scan of chest in January 2019    Patient has never smoked but did work in the [employer] most of her life and there was definitely asbestos exposure which we have been trying to monitor.  She has 2 spiculated nodules that had been unchanged on her CT scan that she had in January 2019 just last month compared with an old one from 2017.  Follow up plan: Return if symptoms worsen or fail to improve.  Counseling provided for all of the vaccine components No orders of the defined  types were placed in this encounter.   Caryl Pina, MD [facility] 12/28/2017, 4:20 PM

## 2018-01-09 ENCOUNTER — Ambulatory Visit (INDEPENDENT_AMBULATORY_CARE_PROVIDER_SITE_OTHER): Payer: Medicare HMO | Admitting: *Deleted

## 2018-01-09 VITALS — BP 125/80 | HR 65 | Temp 97.1°F | Ht 61.0 in | Wt 142.0 lb

## 2018-01-09 DIAGNOSIS — Z0000 Encounter for general adult medical examination without abnormal findings: Secondary | ICD-10-CM

## 2018-01-09 NOTE — Progress Notes (Signed)
Subjective:   Norma Barajas is a 77 y.o. female who presents for Medicare Annual (Subsequent) preventive examination. She is retired from [employer] and she enjoys watching TV and working word puzzles. For exercise, she walks and she also states that she eats healthy. She is active in the church and she lives at home with her husband. She has one sone, and he lives local. They have 2 cats and fall risks were discussed today.   Cardiac Risk Factors include: advanced age (>59men, >74 women);hypertension     Objective:     Vitals: BP 125/80 (BP Location: Right Arm)   Pulse 65   Temp (!) 97.1 F (36.2 C) (Oral)   Ht 5\' 1"  (1.549 m)   Wt 142 lb (64.4 kg)   BMI 26.83 kg/m   Body mass index is 26.83 kg/m.  Advanced Directives 10/06/2016 04/16/2016 04/16/2016 04/15/2016 04/11/2016 02/09/2016 08/21/2015  Does Patient Have a Medical Advance Directive? No No No No No No No  Would patient like information on creating a medical advance directive? No - Patient declined No - patient declined information No - patient declined information No - patient declined information No - patient declined information No - patient declined information No - patient declined information  Pre-existing out of facility DNR order (yellow form or pink MOST form) - - - - - - -    Tobacco Social History   Tobacco Use  Smoking Status Never Smoker  Smokeless Tobacco Never Used     Counseling given: Not Answered   Clinical Intake:                       Past Medical History:  Diagnosis Date  . Anxiety   . Arthritis    "right leg" (04/11/2015)  . Cataract   . Coronary atherosclerosis of native coronary artery    a. Nonobstructive minimal CAD 10/2005.  Marland Kitchen Depression   . Diastolic dysfunction    Grade 1. Ejection fraction 60-65%.  . Erosive esophagitis   . Essential hypertension   . GERD (gastroesophageal reflux disease)   . Heart murmur   . History of hiatal hernia   . Hypercholesterolemia   . Migraine      "used to have them right bad; I don't now" (04/11/2015)  . Osteoporosis   . Paroxysmal atrial fibrillation (HCC)   . PONV (postoperative nausea and vomiting)   . Vertigo    Past Surgical History:  Procedure Laterality Date  . BRAVO Chloride STUDY  11/15/2012   Procedure: BRAVO [place];  Surgeon: [name], MD;  Location: [facility];  Service: Endoscopy;;  . CATARACT EXTRACTION W/ INTRAOCULAR LENS  IMPLANT, BILATERAL Bilateral   . COLONOSCOPY  2008   Dr. [name]: internal hemorrhoids   . DILATION AND CURETTAGE OF UTERUS    . ESOPHAGOGASTRODUODENOSCOPY (EGD) WITH ESOPHAGEAL DILATION  2001   Dr. [name]: erosive esophagitis, esophageal stricture, duodenitis, s/p Savary dilation  . ESOPHAGOGASTRODUODENOSCOPY (EGD) WITH ESOPHAGEAL DILATION  11/15/2012   PXT:GGYIRSWNIO web was found & MOST LIKELY CAUSE FOR DYAPHAGIA/Polyp was found in the gastric body and gastric fundus/ gastritis on bx  . EYE SURGERY Bilateral    "laser OR after cataract OR; cause I couldn't see"  . FLEXIBLE SIGMOIDOSCOPY N/A 08/22/2014   Procedure: FLEXIBLE SIGMOIDOSCOPY;  Surgeon: [name], MD;  Location: [facility];  Service: Endoscopy;  Laterality: N/A;  830  . FRACTURE SURGERY Right    below the knee -  2 bones broke has plates  . HEMORRHOID BANDING N/A 08/22/2014   Procedure: HEMORRHOID BANDING;  Surgeon: [name], MD;  Location: [facility];  Service: Endoscopy;  Laterality: N/A;  . OPEN REDUCTION INTERNAL FIXATION (ORIF) TIBIA/FIBULA FRACTURE Right 2013   broke tibia and fibula after falling down stairs  . PROLAPSED UTERINE FIBROID LIGATION  2015  . TOTAL ABDOMINAL HYSTERECTOMY     Family History  Problem Relation Age of Onset  . Colon cancer Mother        Diagnosed at age 51  . Heart disease Mother   . Osteoporosis Mother   . Hip fracture Mother   . Stroke Sister   . Diabetes Sister   . Osteoporosis Sister   . Arthritis Sister   . Cancer Sister        uterine  . Stroke Sister   . Heart  disease Father   . Hyperlipidemia Brother   . Hypertension Brother   . Heart disease Brother   . Stroke Brother   . Heart disease Sister   . Dementia Sister   . Diabetes Son   . Stroke Son   . Heart attack Neg Hx    Social History   Socioeconomic History  . Marital status: Married    Spouse name: george   . Number of children: 1  . Years of education: None  . Highest education level: None  Social Needs  . Financial resource strain: None  . Food insecurity - worry: None  . Food insecurity - inability: None  . Transportation needs - medical: None  . Transportation needs - non-medical: None  Occupational History  . Occupation: Retired    Comment: Textile  Tobacco Use  . Smoking status: Never Smoker  . Smokeless tobacco: Never Used  Substance and Sexual Activity  . Alcohol use: No    Alcohol/week: 0.0 oz  . Drug use: No  . Sexual activity: Yes  Other Topics Concern  . None  Social History Narrative   Married   No regular exercise    Outpatient Encounter Medications as of 01/09/2018  Medication Sig  . ALPRAZolam (XANAX) 0.25 MG tablet Take 1 Tablet by mouth 2 times a day  . amLODipine (NORVASC) 10 MG tablet TAKE 1 TABLET EVERY DAY  . cholecalciferol (VITAMIN D) 1000 UNITS tablet Take 1,000 Units by mouth daily.  . fluticasone (FLONASE) 50 MCG/ACT nasal spray Place 1 spray into both nostrils 2 (two) times daily as needed for allergies or rhinitis.  . furosemide (LASIX) 20 MG tablet Take 1 tablet (20 mg total) by mouth daily as needed.  . Lactobacillus (PROBIOTIC ACIDOPHILUS PO) Take 1 capsule by mouth daily.  Marland Kitchen loratadine (CLARITIN) 10 MG tablet Take 1 tablet (10 mg total) by mouth daily.  . metoprolol tartrate (LOPRESSOR) 25 MG tablet TAKE 1/2 TABLET TWICE DAILY  . Multiple Vitamin (MULTIVITAMIN) capsule Take 1 capsule by mouth every evening.  Marland Kitchen omeprazole (PRILOSEC) 20 MG capsule TAKE 1 CAPSULE EVERY DAY  . simvastatin (ZOCOR) 40 MG tablet Take 1 tablet (40 mg total)  by mouth every evening.  . sotalol (BETAPACE) 80 MG tablet TAKE 1 TABLET (80 MG TOTAL) BY MOUTH DAILY.  Marland Kitchen warfarin (COUMADIN) 3 MG tablet TAKE 1/2 TO 1 TABLET EVERY DAY AS DIRECTED  BY  ANTICOAGULATION  CLINIC   No facility-administered encounter medications on file as of 01/09/2018.     Activities of Daily Living In your present state of health, do you have any difficulty  performing the following activities: 01/09/2018  Hearing? N  Vision? Y  Comment rx glasses   Difficulty concentrating or making decisions? Y  Walking or climbing stairs? Y  Comment stairs - has to becareful  Dressing or bathing? N  Doing errands, shopping? N  Preparing Food and eating ? N  Using the Toilet? N  In the past six months, have you accidently leaked urine? N  Do you have problems with loss of bowel control? N  Managing your Medications? N  Managing your Finances? N  Housekeeping or managing your Housekeeping? N  Some recent data might be hidden    Patient Care Team: Dettinger, Fransisca Kaufmann, MD as PCP - General (Family Medicine) [name], MD as Attending Physician (Gastroenterology) Melina Schools, OD as Consulting Physician (Optometry) Josue Hector, MD as Consulting Physician (Cardiology)    Assessment:   This is a routine wellness examination for Norma Barajas.  Exercise Activities and Dietary recommendations Current Exercise Habits: Home exercise routine, Type of exercise: walking, Time (Minutes): 30, Frequency (Times/Week): 5, Weekly Exercise (Minutes/Week): 150, Intensity: Mild, Exercise limited by: None identified  Goals    . Exercise 3 to 7 times per week (30 min per time)     Continue daily walking.  Great job!    . Prevent falls     Stay active!!        Fall Risk Fall Risk  01/09/2018 12/28/2017 12/07/2017 10/26/2017 07/19/2017  Falls in the past year? Yes No Yes No No  Number falls in past yr: 1 - 1 - -  Injury with Fall? No - Yes - -  Risk Factor Category  - - - - -  Risk for fall due  to : - - - - -  Risk for fall due to: Comment - - - - -  Follow up - - - - -   Is the patient's home free of loose throw rugs in walkways, pet beds, electrical cords, etc?  Fall risks and hazards were discussed today   Depression Screen PHQ 2/9 Scores 01/09/2018 12/28/2017 12/07/2017 10/26/2017  PHQ - 2 Score 0 0 0 0     Cognitive Function MMSE - Mini Mental State Exam 01/09/2018 10/06/2016 04/04/2015  Orientation to time 5 5 4   Orientation to Place 5 5 5   Registration 3 3 3   Attention/ Calculation 5 4 4   Recall 2 2 3   Language- name 2 objects 2 2 2   Language- repeat 1 1 1   Language- follow 3 step command 2 3 3   Language- read & follow direction 1 1 1   Write a sentence 1 1 1   Copy design 1 0 0  Total score 28 27 27         Immunization History  Administered Date(s) Administered  . DTaP 08/27/2009  . Influenza Whole 08/04/2010  . Influenza, High Dose Seasonal PF 08/05/2016, 08/22/2017  . Influenza,inj,Quad PF,6+ Mos 08/20/2013, 08/19/2014, 08/20/2015  . Pneumococcal Conjugate-13 09/11/2015  . Pneumococcal Polysaccharide-23 10/06/2016    Qualifies for Shingles Vaccine? Discussed and declined   Screening Tests Health Maintenance  Topic Date Due  . TETANUS/TDAP  12/28/2018 (Originally 01/15/1960)  . MAMMOGRAM  06/30/2018  . DEXA SCAN  07/15/2019  . INFLUENZA VACCINE  Completed  . PNA vac Low Risk Adult  Completed    Cancer Screenings: Lung: Low Dose CT Chest recommended if Age 48-80 years, 30 pack-year currently smoking OR have quit w/in 15years. Patient does qualify. Breast:  Up to date on Mammogram?  Yes   Up to date of Bone Density/Dexa? Yes  Additional Screenings: Hepatitis B/HIV/Syphillis: Hepatitis C Screening:  Declined     Plan:   keep follow up with DR Dettinger and other specialist Information on advanced directives was given today She will review   I have personally reviewed and noted the following in the patient's chart:   . Medical and social  history . Use of alcohol, tobacco or illicit drugs  . Current medications and supplements . Functional ability and status . Nutritional status . Physical activity . Advanced directives . List of other physicians . Hospitalizations, surgeries, and ER visits in previous 12 months . Vitals . Screenings to include cognitive, depression, and falls . Referrals and appointments  In addition, I have reviewed and discussed with patient certain preventive protocols, quality metrics, and best practice recommendations. A written personalized care plan for preventive services as well as general preventive health recommendations were provided to patient.     Bullins, Cameron Proud, LPN  04/10/8176   I have reviewed and agree with the above AWV documentation.  [name], M.D.

## 2018-01-09 NOTE — Patient Instructions (Signed)
  Norma Barajas , Thank you for taking time to come for your Medicare Wellness Visit. I appreciate your ongoing commitment to your health goals. Please review the following plan we discussed and let me know if I can assist you in the future.   These are the goals we discussed: Goals    . Exercise 3 to 7 times per week (30 min per time)     Continue daily walking.  Great job!    . Prevent falls     Stay active!!        This is a list of the screening recommended for you and due dates:  Health Maintenance  Topic Date Due  . Tetanus Vaccine  12/28/2018*  . Mammogram  06/30/2018  . DEXA scan (bone density measurement)  07/15/2019  . Flu Shot  Completed  . Pneumonia vaccines  Completed  *Topic was postponed. The date shown is not the original due date.    Keep follow up with Dr Warrick Parisian and other specialists Review the advanced directives

## 2018-01-13 ENCOUNTER — Ambulatory Visit (INDEPENDENT_AMBULATORY_CARE_PROVIDER_SITE_OTHER): Payer: Medicare HMO | Admitting: Pharmacist Clinician (PhC)/ Clinical Pharmacy Specialist

## 2018-01-13 DIAGNOSIS — I4891 Unspecified atrial fibrillation: Secondary | ICD-10-CM

## 2018-01-13 LAB — COAGUCHEK XS/INR WAIVED
INR: 2.4 — ABNORMAL HIGH (ref 0.9–1.1)
Prothrombin Time: 28.6 s

## 2018-01-24 ENCOUNTER — Telehealth: Payer: Self-pay | Admitting: Family Medicine

## 2018-01-24 NOTE — Telephone Encounter (Signed)
Pt c/o bleeding with bowel movement appt scheduled for evaluation

## 2018-01-24 NOTE — Telephone Encounter (Signed)
Pt upset and wants to talk to triage

## 2018-01-25 ENCOUNTER — Ambulatory Visit (INDEPENDENT_AMBULATORY_CARE_PROVIDER_SITE_OTHER): Payer: Medicare HMO | Admitting: Family Medicine

## 2018-01-25 ENCOUNTER — Encounter: Payer: Self-pay | Admitting: Family Medicine

## 2018-01-25 VITALS — BP 131/82 | HR 73 | Temp 97.4°F | Ht 61.0 in | Wt 140.0 lb

## 2018-01-25 DIAGNOSIS — K649 Unspecified hemorrhoids: Secondary | ICD-10-CM

## 2018-01-25 MED ORDER — PHENYLEPHRINE IN HARD FAT 0.25 % RE SUPP: 1.0000 | Freq: Two times a day (BID) | RECTAL | 0 refills | Status: DC

## 2018-01-25 NOTE — Progress Notes (Signed)
BP 131/82   Pulse 73   Temp (!) 97.4 F (36.3 C) (Oral)   Ht 5\' 1"  (1.549 m)   Wt 140 lb (63.5 kg)   BMI 26.45 kg/m    Subjective:    Patient ID: Norma Barajas, female    DOB: 05-04-1941, 77 y.o.   MRN: [id number]  HPI: Norma Barajas is a 77 y.o. female presenting on 01/25/2018 for Hemorrhoids (pain and bleeding, history of hemorrhoids)   HPI Hemorrhoids Patient has very painful and bleeding hemorrhoids that have been going on for the past 2 days.  She has been using her hemorrhoid cream but feels that is not helping at all.  It is painful to sit on her bottom because of the hemorrhoids and then she has pain with bowel movements.  She did not have any bowel movements yesterday because of intensity of the pain in her rectum which she says is moderate but definitely bothers her most all the time.  She said that she had 2 bowel movements today without blood but improved days she had 3 or 4 bowel movements with blood.  She has had these frequently before but not recently.  Relevant past medical, surgical, family and social history reviewed and updated as indicated. Interim medical history since our last visit reviewed. Allergies and medications reviewed and updated.  Review of Systems  Constitutional: Negative for chills and fever.  Eyes: Negative for visual disturbance.  Respiratory: Negative for chest tightness and shortness of breath.   Cardiovascular: Negative for chest pain and leg swelling.  Gastrointestinal: Positive for anal bleeding and rectal pain. Negative for abdominal distention, abdominal pain, blood in stool, constipation, diarrhea and nausea.  Genitourinary: Negative for difficulty urinating and dysuria.  Musculoskeletal: Negative for back pain and gait problem.  Skin: Negative for rash.  Neurological: Negative for light-headedness and headaches.  Psychiatric/Behavioral: Negative for agitation and behavioral problems.  All other systems reviewed and are negative.   Per  HPI unless specifically indicated above   Allergies as of 01/25/2018      Reactions   Triamterene-hctz Other (See Comments)   weakness   Atorvastatin Other (See Comments)   Myalgias   Crestor [rosuvastatin Calcium] Other (See Comments)   weakness   Morphine Nausea Only   Risedronate Sodium Other (See Comments)   ACTONEL REACTION: reflux      Medication List        Accurate as of 01/25/18 11:48 AM. Always use your most recent med list.          ALPRAZolam 0.25 MG tablet Commonly known as:  XANAX Take 1 Tablet by mouth 2 times a day   amLODipine 10 MG tablet Commonly known as:  NORVASC TAKE 1 TABLET EVERY DAY   cholecalciferol 1000 units tablet Commonly known as:  VITAMIN D Take 1,000 Units by mouth daily.   furosemide 20 MG tablet Commonly known as:  LASIX Take 1 tablet (20 mg total) by mouth daily as needed.   metoprolol tartrate 25 MG tablet Commonly known as:  LOPRESSOR TAKE 1/2 TABLET TWICE DAILY   multivitamin capsule Take 1 capsule by mouth every evening.   omeprazole 20 MG capsule Commonly known as:  PRILOSEC TAKE 1 CAPSULE EVERY DAY   phenylephrine 0.25 % suppository Commonly known as:  (USE for PREPARATION-H) Place 1 suppository rectally 2 (two) times daily.   simvastatin 40 MG tablet Commonly known as:  ZOCOR Take 1 tablet (40 mg total) by mouth every evening.  sotalol 80 MG tablet Commonly known as:  BETAPACE TAKE 1 TABLET (80 MG TOTAL) BY MOUTH DAILY.   warfarin 3 MG tablet Commonly known as:  COUMADIN Take as directed by the anticoagulation clinic. If you are unsure how to take this medication, talk to your nurse or doctor. Original instructions:  TAKE 1/2 TO 1 TABLET EVERY DAY AS DIRECTED  BY  ANTICOAGULATION  CLINIC          Objective:    BP 131/82   Pulse 73   Temp (!) 97.4 F (36.3 C) (Oral)   Ht 5\' 1"  (1.549 m)   Wt 140 lb (63.5 kg)   BMI 26.45 kg/m   Wt Readings from Last 3 Encounters:  01/25/18 140 lb (63.5 kg)    01/09/18 142 lb (64.4 kg)  12/28/17 142 lb (64.4 kg)    Physical Exam  Constitutional: She is oriented to person, place, and time. She appears well-developed and well-nourished. No distress.  Eyes: Conjunctivae are normal.  Abdominal: Soft. Bowel sounds are normal. She exhibits no distension. There is no tenderness. There is no rebound.  Genitourinary: Rectal exam shows external hemorrhoid, internal hemorrhoid and tenderness. Rectal exam shows no fissure.  Musculoskeletal: Normal range of motion.  Neurological: She is alert and oriented to person, place, and time. Coordination normal.  Skin: Skin is warm and dry. No rash noted. She is not diaphoretic.  Psychiatric: She has a normal mood and affect. Her behavior is normal.  Nursing note and vitals reviewed.  No active bleeding currently     Assessment & Plan:   Problem List Items Addressed This Visit    None    Visit Diagnoses    Bleeding hemorrhoid    -  Primary   Relevant Medications   phenylephrine (,USE FOR PREPARATION-H,) 0.25 % suppository   Other Relevant Orders   Ambulatory referral to General Surgery       Follow up plan: Return if symptoms worsen or fail to improve.  Counseling provided for all of the vaccine components Orders Placed This Encounter  Procedures  . Ambulatory referral to [place], MD [facility] 01/25/2018, 11:49 AM

## 2018-02-07 ENCOUNTER — Encounter: Payer: Self-pay | Admitting: General Surgery

## 2018-02-07 ENCOUNTER — Ambulatory Visit: Payer: Medicare HMO | Admitting: General Surgery

## 2018-02-07 VITALS — BP 131/72 | HR 68 | Temp 98.2°F | Resp 18 | Ht 61.0 in | Wt 139.0 lb

## 2018-02-07 DIAGNOSIS — K643 Fourth degree hemorrhoids: Secondary | ICD-10-CM | POA: Diagnosis not present

## 2018-02-07 NOTE — Progress Notes (Signed)
[facility] History and Physical  Reason for Referral: Hemorrhoids  Referring Physician: Dettinger, Fransisca Kaufmann, MD    Chief Complaint    Hemorrhoids      Norma Barajas is a 77 y.o. female.  HPI: Norma Barajas is a 77 yo who has A fib, HTN, HLD, GERD, on coumadin for her A fib who was seen by Dr. Warrick Parisian 3/20 with complaints of a hemorrhoid flare. She had bleeding and rectal pain with defecation. She has had hemorrhoids for some time, and had a colonoscopy with Dr. [name] in the past that demonstrated the hemorrhoids as well as a banding procedure in 2015 that helped with the bleeding for some time. She now continues to have flares intermittently and uses some creams.  She reports bleeding on occasion, and wearing a pad everyday. She does have some pain with BMs and some discomfort with the hemorrhoids being prolapsed out. She does not try to push them back in, and reports some hygiene issues and itching. .    She has not had bleeding in the past few days and has had normal soft BMs.  She has not seen her cardiologist in 1 year, and says she should have seen them in December.  She reports no incontinence with her normal BMs, but has had some leakage with diarrhea.   Past Medical History:  Diagnosis Date  . Anxiety   . Arthritis    "right leg" (04/11/2015)  . Cataract   . Coronary atherosclerosis of native coronary artery    a. Nonobstructive minimal CAD 10/2005.  Marland Kitchen Depression   . Diastolic dysfunction    Grade 1. Ejection fraction 60-65%.  . Erosive esophagitis   . Essential hypertension   . GERD (gastroesophageal reflux disease)   . Heart murmur   . History of hiatal hernia   . Hypercholesterolemia   . Migraine    "used to have them right bad; I don't now" (04/11/2015)  . Osteoporosis   . Paroxysmal atrial fibrillation (HCC)   . PONV (postoperative nausea and vomiting)   . Vertigo     Past Surgical History:  Procedure Laterality Date  . BRAVO [name] STUDY  11/15/2012   Procedure: BRAVO [place];  Surgeon: [name], MD;  [facility];  Service: Endoscopy;;  . CATARACT EXTRACTION W/ INTRAOCULAR LENS  IMPLANT, BILATERAL Bilateral   . COLONOSCOPY  2008   Dr. [name]: internal hemorrhoids   . DILATION AND CURETTAGE OF UTERUS    . ESOPHAGOGASTRODUODENOSCOPY (EGD) WITH ESOPHAGEAL DILATION  2001   Dr. [name]: erosive esophagitis, esophageal stricture, duodenitis, s/p Savary dilation  . ESOPHAGOGASTRODUODENOSCOPY (EGD) WITH ESOPHAGEAL DILATION  11/15/2012   UVO:[id number] web was found & MOST LIKELY CAUSE FOR DYAPHAGIA/Polyp was found in the gastric body and gastric fundus/ gastritis on bx  . EYE SURGERY Bilateral    "laser OR after cataract OR; cause I couldn't see"  . FLEXIBLE SIGMOIDOSCOPY N/A 08/22/2014   Procedure: FLEXIBLE SIGMOIDOSCOPY;  Surgeon: [name], MD;  [facility]: [facility];  Service: Endoscopy;  Laterality: N/A;  830  . FRACTURE SURGERY Right    below the knee - 2 bones broke has plates  . HEMORRHOID BANDING N/A 08/22/2014   Procedure: HEMORRHOID BANDING;  Surgeon: [name], MD;  Location: [facility];  Service: Endoscopy;  Laterality: N/A;  . OPEN REDUCTION INTERNAL FIXATION (ORIF) TIBIA/FIBULA FRACTURE Right 2013   broke tibia and fibula after falling down stairs  . PROLAPSED UTERINE FIBROID LIGATION  2015  . TOTAL ABDOMINAL HYSTERECTOMY      Family History  Problem Relation Age of Onset  . Colon cancer Mother        Diagnosed at age 57  . Heart disease Mother   . Osteoporosis Mother   . Hip fracture Mother   . Stroke Sister   . Diabetes Sister   . Osteoporosis Sister   . Arthritis Sister   . Cancer Sister        uterine  . Stroke Sister   . Heart disease Father   . Hyperlipidemia Brother   . Hypertension Brother   . Heart disease Brother   . Stroke Brother   . Heart disease Sister   . Dementia Sister   . Diabetes Son   . Stroke Son   . Heart attack Neg Hx     Social History   Tobacco Use    . Smoking status: Never Smoker  . Smokeless tobacco: Never Used  Substance Use Topics  . Alcohol use: No    Alcohol/week: 0.0 oz  . Drug use: No    Medications: I have reviewed the patient's current medications. Allergies as of 02/07/2018      Reactions   Triamterene-hctz Other (See Comments)   weakness   Atorvastatin Other (See Comments)   Myalgias   Crestor [rosuvastatin Calcium] Other (See Comments)   weakness   Morphine Nausea Only   Risedronate Sodium Other (See Comments)   ACTONEL REACTION: reflux      Medication List        Accurate as of 02/07/18  9:36 AM. Always use your most recent med list.          ALPRAZolam 0.25 MG tablet Commonly known as:  XANAX Take 1 Tablet by mouth 2 times a day   amLODipine 10 MG tablet Commonly known as:  NORVASC TAKE 1 TABLET EVERY DAY   cholecalciferol 1000 units tablet Commonly known as:  VITAMIN D Take 1,000 Units by mouth daily.   furosemide 20 MG tablet Commonly known as:  LASIX Take 1 tablet (20 mg total) by mouth daily as needed.   metoprolol tartrate 25 MG tablet Commonly known as:  LOPRESSOR TAKE 1/2 TABLET TWICE DAILY   multivitamin capsule Take 1 capsule by mouth every evening.   omeprazole 20 MG capsule Commonly known as:  PRILOSEC TAKE 1 CAPSULE EVERY DAY   phenylephrine 0.25 % suppository Commonly known as:  (USE for PREPARATION-H) Place 1 suppository rectally 2 (two) times daily.   simvastatin 40 MG tablet Commonly known as:  ZOCOR Take 1 tablet (40 mg total) by mouth every evening.   sotalol 80 MG tablet Commonly known as:  BETAPACE TAKE 1 TABLET (80 MG TOTAL) BY MOUTH DAILY.   warfarin 3 MG tablet Commonly known as:  COUMADIN Take as directed by the anticoagulation clinic. If you are unsure how to take this medication, talk to your nurse or doctor. Original instructions:  TAKE 1/2 TO 1 TABLET EVERY DAY AS DIRECTED  BY  ANTICOAGULATION  CLINIC        ROS:  A comprehensive review of  systems was negative except for: Cardiovascular: positive for HTN Gastrointestinal: positive for abdominal pain and BRBPR Allergic/Immunologic: positive for hay fever  Blood pressure 131/72, pulse 68, temperature 98.2 F (36.8 C), resp. rate 18, height 5\' 1"  (1.549 m), weight 139 lb (63 kg). Physical Exam  Constitutional: She is oriented to person, place, and time and well-developed, well-nourished, and in no distress.  HENT:  Head: Normocephalic.  Eyes: Pupils are equal, round, and reactive to light.  Neck: Normal range of motion. Neck supple.  Cardiovascular: Normal rate.  Pulmonary/Chest: Effort normal and breath sounds normal.  Abdominal: Soft. She exhibits no distension. There is no tenderness.  Genitourinary: Rectal exam shows external hemorrhoid, internal hemorrhoid and tenderness. Rectal exam shows no mass.  Genitourinary Comments: Slightly decreased tone but good squeeze, no fissure  Musculoskeletal: Normal range of motion. She exhibits no edema.  Neurological: She is alert and oriented to person, place, and time.  Skin: Skin is warm and dry.  Psychiatric: Mood, memory, affect and judgment normal.  Vitals reviewed.   Results: None   Assessment & Plan:  Norma Barajas is a 77 y.o. female with internal /external hemorrhoids with some prolapse that is chronic and does not reduce spontaneously.  She continue to have bleeding and discomfort after her banding in 2015. She says that this is getting to be annoying and she is ready for another intervention.    Hemorrhoid surgery for large internal/ external hemorrhoids is very painful. The pain and discomfort that the patient is having currently will be magnified after the surgery for at least 2-3 weeks.  The patient will have feelings of constant pressure and pain in the area from the swelling and removal of the anoderm (skin around the anus). The internal hemorrhoids are not painful to remove because the same nerves are not involved, and  the sensation is different, but removal of any external hemorrhoids will cause significant discomfort. They will need at least 4-6 weeks to recover from the surgery, and should not expect to be able to feel back to "normal for 6-8 weeks."    The risk of hemorrhoid surgery include bleeding, risk of infection although rare, and the risk of narrowing the anal canal if too much tissue is removed. Given this risk, it is likely that only the 2 largest hemorrhoid columns would be removed during the initial surgery.  We have also discussed the risk of incontinence after surgery if the muscles were injured, and although this is rare that it can happen and is another reason to limit the amount of hemorrhoids removed.   Typically this is an outpatient procedure, and discussed with her that given her age this could be some what debilitating, and may require some time in the hospital. The best thing for her is to continue to move around and keep up her strength post operatively.  She reported a history of Rib fractures and sternal fractures in 2017 requiring 2 months of recovery at home, and she understands that the pain associated with this surgery will be as much if not worse than the pain from the rib fractures.    -Cardiac risk stratification, will need to hold coumadin for 5 days prior to surgery, and will need to hold if for likely 5 days after surgery. Should not need a bridge, but will see what cardiology says.  No recent chest pain or discomfort.   Future Appointments  Date Time Provider [place]  02/21/2018  2:00 PM Erma Heritage, [place] [facility] [place] [facility]  03/07/2018 10:40 AM [facility]-ANNUAL WELLNESS VISIT [facility] None  03/07/2018 10:55 AM Dettinger, Fransisca Kaufmann, MD [facility] None  03/10/2018 10:20 AM Memory Argue, PharmD [facility] None    All questions were answered to the satisfaction of the patient and family.  The risk and benefits of hemorrhoid surgery were discussed including but  not limited to the above  discussion. After careful consideration, Norma Barajas has decided to proceed.    [name] 02/07/2018, 9:36 AM

## 2018-02-09 ENCOUNTER — Other Ambulatory Visit: Payer: Self-pay | Admitting: Family Medicine

## 2018-02-09 DIAGNOSIS — F411 Generalized anxiety disorder: Secondary | ICD-10-CM

## 2018-02-21 ENCOUNTER — Ambulatory Visit: Payer: Medicare HMO | Admitting: Student

## 2018-02-28 ENCOUNTER — Other Ambulatory Visit: Payer: Medicare HMO

## 2018-02-28 ENCOUNTER — Other Ambulatory Visit: Payer: Self-pay | Admitting: Family Medicine

## 2018-02-28 DIAGNOSIS — E782 Mixed hyperlipidemia: Secondary | ICD-10-CM | POA: Diagnosis not present

## 2018-02-28 DIAGNOSIS — K219 Gastro-esophageal reflux disease without esophagitis: Secondary | ICD-10-CM

## 2018-02-28 DIAGNOSIS — I10 Essential (primary) hypertension: Secondary | ICD-10-CM | POA: Diagnosis not present

## 2018-02-28 DIAGNOSIS — I4819 Other persistent atrial fibrillation: Secondary | ICD-10-CM

## 2018-02-28 DIAGNOSIS — I481 Persistent atrial fibrillation: Secondary | ICD-10-CM | POA: Diagnosis not present

## 2018-02-28 DIAGNOSIS — I4891 Unspecified atrial fibrillation: Secondary | ICD-10-CM

## 2018-02-28 LAB — CMP14+EGFR
A/G RATIO: 1.6 (ref 1.2–2.2)
ALK PHOS: 77 IU/L (ref 39–117)
ALT: 16 IU/L (ref 0–32)
AST: 25 IU/L (ref 0–40)
Albumin: 4.5 g/dL (ref 3.5–4.8)
BILIRUBIN TOTAL: 0.4 mg/dL (ref 0.0–1.2)
BUN/Creatinine Ratio: 11 — ABNORMAL LOW (ref 12–28)
BUN: 9 mg/dL (ref 8–27)
CHLORIDE: 103 mmol/L (ref 96–106)
CO2: 25 mmol/L (ref 20–29)
Calcium: 9.1 mg/dL (ref 8.7–10.3)
Creatinine, Ser: 0.83 mg/dL (ref 0.57–1.00)
GFR calc non Af Amer: 67 mL/min/{1.73_m2} (ref >59)
GFR, EST AFRICAN AMERICAN: 78 mL/min/{1.73_m2} (ref >59)
Globulin, Total: 2.8 g/dL (ref 1.5–4.5)
Glucose: 101 mg/dL — ABNORMAL HIGH (ref 65–99)
POTASSIUM: 4 mmol/L (ref 3.5–5.2)
Sodium: 145 mmol/L — ABNORMAL HIGH (ref 134–144)
TOTAL PROTEIN: 7.3 g/dL (ref 6.0–8.5)

## 2018-02-28 LAB — CBC WITH DIFFERENTIAL/PLATELET
BASOS ABS: 0 10*3/uL (ref 0.0–0.2)
Basos: 1 %
EOS (ABSOLUTE): 0.1 10*3/uL (ref 0.0–0.4)
Eos: 2 %
Hematocrit: 44.6 % (ref 34.0–46.6)
Hemoglobin: 15.3 g/dL (ref 11.1–15.9)
IMMATURE GRANS (ABS): 0 10*3/uL (ref 0.0–0.1)
Immature Granulocytes: 0 %
LYMPHS ABS: 1.6 10*3/uL (ref 0.7–3.1)
LYMPHS: 37 %
MCH: 31.7 pg (ref 26.6–33.0)
MCHC: 34.3 g/dL (ref 31.5–35.7)
MCV: 93 fL (ref 79–97)
Monocytes Absolute: 0.4 10*3/uL (ref 0.1–0.9)
Monocytes: 10 %
NEUTROS ABS: 2.2 10*3/uL (ref 1.4–7.0)
Neutrophils: 50 %
PLATELETS: 175 10*3/uL (ref 150–379)
RBC: 4.82 x10E6/uL (ref 3.77–5.28)
RDW: 14.4 % (ref 12.3–15.4)
WBC: 4.4 10*3/uL (ref 3.4–10.8)

## 2018-02-28 LAB — LIPID PANEL
Chol/HDL Ratio: 4.2 ratio (ref 0.0–4.4)
Cholesterol, Total: 175 mg/dL (ref 100–199)
HDL: 42 mg/dL (ref >39)
LDL CALC: 84 mg/dL (ref 0–99)
TRIGLYCERIDES: 244 mg/dL — AB (ref 0–149)
VLDL CHOLESTEROL CAL: 49 mg/dL — AB (ref 5–40)

## 2018-03-01 ENCOUNTER — Ambulatory Visit: Payer: Medicare HMO | Admitting: Family Medicine

## 2018-03-02 ENCOUNTER — Ambulatory Visit: Payer: Medicare HMO | Admitting: Family Medicine

## 2018-03-07 ENCOUNTER — Ambulatory Visit (INDEPENDENT_AMBULATORY_CARE_PROVIDER_SITE_OTHER): Payer: Medicare HMO | Admitting: Family Medicine

## 2018-03-07 ENCOUNTER — Encounter: Payer: Self-pay | Admitting: Family Medicine

## 2018-03-07 VITALS — BP 138/73 | HR 76 | Temp 97.8°F | Ht 61.0 in | Wt 141.0 lb

## 2018-03-07 DIAGNOSIS — N898 Other specified noninflammatory disorders of vagina: Secondary | ICD-10-CM | POA: Diagnosis not present

## 2018-03-07 DIAGNOSIS — F411 Generalized anxiety disorder: Secondary | ICD-10-CM | POA: Diagnosis not present

## 2018-03-07 DIAGNOSIS — I10 Essential (primary) hypertension: Secondary | ICD-10-CM | POA: Diagnosis not present

## 2018-03-07 DIAGNOSIS — R7303 Prediabetes: Secondary | ICD-10-CM

## 2018-03-07 DIAGNOSIS — N9089 Other specified noninflammatory disorders of vulva and perineum: Secondary | ICD-10-CM | POA: Diagnosis not present

## 2018-03-07 DIAGNOSIS — E782 Mixed hyperlipidemia: Secondary | ICD-10-CM

## 2018-03-07 DIAGNOSIS — I4891 Unspecified atrial fibrillation: Secondary | ICD-10-CM

## 2018-03-07 LAB — WET PREP FOR TRICH, YEAST, CLUE
CLUE CELL EXAM: NEGATIVE
Trichomonas Exam: NEGATIVE
Yeast Exam: NEGATIVE

## 2018-03-07 MED ORDER — ALPRAZOLAM 0.25 MG PO TABS: 0.2500 mg | ORAL_TABLET | Freq: Two times a day (BID) | ORAL | 5 refills | Status: DC

## 2018-03-07 NOTE — Progress Notes (Signed)
BP 138/73   Pulse 76   Temp 97.8 F (36.6 C) (Oral)   Ht 5' 1" (1.549 m)   Wt 141 lb (64 kg)   BMI 26.64 kg/m    Subjective:    Patient ID: Norma Barajas, female    DOB: [birth year], 77 y.o.   MRN: [id number]  HPI: Norma Barajas is a 77 y.o. female presenting on 03/07/2018 for Hyperlipidemia; Hypertension; and Vaginal Itching (x 1 week)   HPI Hyperlipidemia Patient is coming in for recheck of his hyperlipidemia. The patient is currently taking simvastatin. They deny any issues with myalgias or history of liver damage from it. They deny any focal numbness or weakness or chest pain.   Hypertension Patient is currently on amlodipine and metoprolol and sotalol, and their blood pressure today is 138/73. Patient denies any lightheadedness or dizziness. Patient denies headaches, blurred vision, chest pains, shortness of breath, or weakness. Denies any side effects from medication and is content with current medication.  Patient has A. fib and had her Coumadin checked 1 week ago.  Prediabetes Patient comes in today for recheck of his diabetes. Patient has been currently taking no medication and we are monitoring for now. Patient is not currently on an ACE inhibitor/ARB. Patient has not seen an ophthalmologist this year. Patient denies any issues with their feet.   Vaginal irritation Patient has been having vaginal irritation and discharge especially on the left side of her inside of her labia.  She feels like the discharge has been much but it has been irritating.  She denies any vaginal bleeding or odor.  She is not currently sexually active and has not been in some years.  She is still currently married.  Patient would like a refill for her Xanax for anxiety and says that is working well and she mainly diseases at night so she can calm her mind down so she can sleep.  Relevant past medical, surgical, family and social history reviewed and updated as indicated. Interim medical history since our  last visit reviewed. Allergies and medications reviewed and updated.  Review of Systems  Constitutional: Negative for chills and fever.  Eyes: Negative for visual disturbance.  Respiratory: Negative for chest tightness and shortness of breath.   Cardiovascular: Negative for chest pain and leg swelling.  Gastrointestinal: Negative for abdominal pain, blood in stool, constipation, diarrhea, nausea and vomiting.  Genitourinary: Positive for vaginal discharge and vaginal pain. Negative for difficulty urinating, dysuria, flank pain, menstrual problem, pelvic pain and vaginal bleeding.  Musculoskeletal: Negative for back pain and gait problem.  Skin: Negative for rash.  Neurological: Negative for light-headedness and headaches.  Psychiatric/Behavioral: Negative for agitation and behavioral problems.  All other systems reviewed and are negative.   Per HPI unless specifically indicated above   Allergies as of 03/07/2018      Reactions   Triamterene-hctz Other (See Comments)   weakness   Atorvastatin Other (See Comments)   Myalgias   Crestor [rosuvastatin Calcium] Other (See Comments)   weakness   Morphine Nausea Only   Risedronate Sodium Other (See Comments)   ACTONEL REACTION: reflux      Medication List        Accurate as of 03/07/18 11:46 AM. Always use your most recent med list.          ALPRAZolam 0.25 MG tablet Commonly known as:  XANAX TAKE 1 TABLET BY MOUTH 2 TIMES A DAY   amLODipine 10 MG tablet Commonly known as:  NORVASC TAKE 1 TABLET EVERY DAY   cholecalciferol 1000 units tablet Commonly known as:  VITAMIN D Take 1,000 Units by mouth daily.   furosemide 20 MG tablet Commonly known as:  LASIX Take 1 tablet (20 mg total) by mouth daily as needed.   metoprolol tartrate 25 MG tablet Commonly known as:  LOPRESSOR TAKE 1/2 TABLET TWICE DAILY   multivitamin capsule Take 1 capsule by mouth every evening.   omeprazole 20 MG capsule Commonly known as:   PRILOSEC TAKE 1 CAPSULE EVERY DAY   phenylephrine 0.25 % suppository Commonly known as:  (USE for PREPARATION-H) Place 1 suppository rectally 2 (two) times daily.   simvastatin 40 MG tablet Commonly known as:  ZOCOR Take 1 tablet (40 mg total) by mouth every evening.   sotalol 80 MG tablet Commonly known as:  BETAPACE TAKE 1 TABLET (80 MG TOTAL) BY MOUTH DAILY.   warfarin 3 MG tablet Commonly known as:  COUMADIN Take as directed by the anticoagulation clinic. If you are unsure how to take this medication, talk to your nurse or doctor. Original instructions:  TAKE 1/2 TO 1 TABLET EVERY DAY AS DIRECTED  BY  ANTICOAGULATION  CLINIC          Objective:    There were no vitals taken for this visit.  Wt Readings from Last 3 Encounters:  02/07/18 139 lb (63 kg)  01/25/18 140 lb (63.5 kg)  01/09/18 142 lb (64.4 kg)    Physical Exam  Constitutional: She is oriented to person, place, and time. She appears well-developed and well-nourished. No distress.  Eyes: Conjunctivae are normal.  Cardiovascular: Normal rate, regular rhythm, normal heart sounds and intact distal pulses.  No murmur heard. Pulmonary/Chest: Effort normal and breath sounds normal. No respiratory distress. She has no wheezes.  Genitourinary:    Pelvic exam was performed with patient supine. There is rash and lesion on the left labia. There is no tenderness on the left labia. Cervix exhibits discharge (Small amount of discharge, white and thick, no palpable cervix ). Right adnexum displays no mass and no tenderness. Left adnexum displays no mass and no tenderness.  Musculoskeletal: Normal range of motion. She exhibits no edema or tenderness.  Neurological: She is alert and oriented to person, place, and time. Coordination normal.  Skin: Skin is warm and dry. No rash noted. She is not diaphoretic.  Psychiatric: She has a normal mood and affect. Her behavior is normal.  Nursing note and vitals reviewed.   Results  for orders placed or performed in visit on 02/28/18  Lipid panel  Result Value Ref Range   Cholesterol, Total 175 100 - 199 mg/dL   Triglycerides 244 (H) 0 - 149 mg/dL   HDL 42 >39 mg/dL   VLDL Cholesterol Cal 49 (H) 5 - 40 mg/dL   LDL Calculated 84 0 - 99 mg/dL   Chol/HDL Ratio 4.2 0.0 - 4.4 ratio  CMP14+EGFR  Result Value Ref Range   Glucose 101 (H) 65 - 99 mg/dL   BUN 9 8 - 27 mg/dL   Creatinine, Ser 0.83 0.57 - 1.00 mg/dL   GFR calc non Af Amer 67 >59 mL/min/1.73   GFR calc Af Amer 78 >59 mL/min/1.73   BUN/Creatinine Ratio 11 (L) 12 - 28   Sodium 145 (H) 134 - 144 mmol/L   Potassium 4.0 3.5 - 5.2 mmol/L   Chloride 103 96 - 106 mmol/L   CO2 25 20 - 29 mmol/L   Calcium 9.1 8.7 -  10.3 mg/dL   Total Protein 7.3 6.0 - 8.5 g/dL   Albumin 4.5 3.5 - 4.8 g/dL   Globulin, Total 2.8 1.5 - 4.5 g/dL   Albumin/Globulin Ratio 1.6 1.2 - 2.2   Bilirubin Total 0.4 0.0 - 1.2 mg/dL   Alkaline Phosphatase 77 39 - 117 IU/L   AST 25 0 - 40 IU/L   ALT 16 0 - 32 IU/L  CBC with Differential/Platelet  Result Value Ref Range   WBC 4.4 3.4 - 10.8 x10E3/uL   RBC 4.82 3.77 - 5.28 x10E6/uL   Hemoglobin 15.3 11.1 - 15.9 g/dL   Hematocrit 44.6 34.0 - 46.6 %   MCV 93 79 - 97 fL   MCH 31.7 26.6 - 33.0 pg   MCHC 34.3 31.5 - 35.7 g/dL   RDW 14.4 12.3 - 15.4 %   Platelets 175 150 - 379 x10E3/uL   Neutrophils 50 Not Estab. %   Lymphs 37 Not Estab. %   Monocytes 10 Not Estab. %   Eos 2 Not Estab. %   Basos 1 Not Estab. %   Neutrophils Absolute 2.2 1.4 - 7.0 x10E3/uL   Lymphocytes Absolute 1.6 0.7 - 3.1 x10E3/uL   Monocytes Absolute 0.4 0.1 - 0.9 x10E3/uL   EOS (ABSOLUTE) 0.1 0.0 - 0.4 x10E3/uL   Basophils Absolute 0.0 0.0 - 0.2 x10E3/uL   Immature Granulocytes 0 Not Estab. %   Immature Grans (Abs) 0.0 0.0 - 0.1 x10E3/uL      Assessment & Plan:   Problem List Items Addressed This Visit      Cardiovascular and Mediastinum   Essential hypertension   Atrial fibrillation (HCC)     Other    Hyperlipidemia   GAD (generalized anxiety disorder)   Relevant Medications   ALPRAZolam (XANAX) 0.25 MG tablet   Pre-diabetes - Primary    Other Visit Diagnoses    Vulvar lesion       Relevant Orders   Ambulatory referral to Obstetrics / Gynecology   WET PREP FOR [place], YEAST, CLUE (Completed)   Vaginal irritation       Relevant Orders   WET PREP FOR [place], YEAST, CLUE (Completed)      Labs looked great, continue current medications, have Coumadin checked with Sharyn Lull 1 week ago so did not check it today.  Patient has lesion on left inner labia that has the appearance of lichen planus or sclerosis, will send to gynecology for possible biopsy if needed  Follow up plan: Return in about 6 months (around 09/06/2018), or if symptoms worsen or fail to improve, for Prediabetes hypertension and cholesterol.  Counseling provided for all of the vaccine components No orders of the defined types were placed in this encounter.   Caryl Pina, MD [facility] 03/07/2018, 11:46 AM

## 2018-03-08 ENCOUNTER — Telehealth: Payer: Self-pay | Admitting: Family Medicine

## 2018-03-08 NOTE — Telephone Encounter (Signed)
Attempted to contact patient. Line busy

## 2018-03-08 NOTE — Telephone Encounter (Signed)
Have her use lotion and hydrocortisone cream and if any rash develops then let us know

## 2018-03-08 NOTE — Telephone Encounter (Signed)
Patient was seen yesterday for vaginal itching that had been going on x 1 week. Patient states she is now having itching in her bilateral hips. Wanted provider to know.

## 2018-03-09 ENCOUNTER — Ambulatory Visit: Payer: Medicare HMO | Admitting: Adult Health

## 2018-03-09 ENCOUNTER — Other Ambulatory Visit: Payer: Self-pay | Admitting: Obstetrics & Gynecology

## 2018-03-09 ENCOUNTER — Encounter: Payer: Self-pay | Admitting: Adult Health

## 2018-03-09 ENCOUNTER — Ambulatory Visit: Payer: Medicare HMO | Admitting: Obstetrics & Gynecology

## 2018-03-09 VITALS — BP 124/76 | HR 64 | Resp 16 | Ht 61.0 in | Wt 138.0 lb

## 2018-03-09 DIAGNOSIS — N9089 Other specified noninflammatory disorders of vulva and perineum: Secondary | ICD-10-CM | POA: Diagnosis not present

## 2018-03-09 DIAGNOSIS — L308 Other specified dermatitis: Secondary | ICD-10-CM | POA: Diagnosis not present

## 2018-03-09 DIAGNOSIS — N903 Dysplasia of vulva, unspecified: Secondary | ICD-10-CM | POA: Diagnosis not present

## 2018-03-09 NOTE — Progress Notes (Signed)
  Subjective:     Patient ID: Norma Barajas, female   DOB: 1941/03/27, 77 y.o.   MRN: [id number]  HPI Norma Barajas is a 77 year old white female, married, sp hysterectomy in for itchy left labial lesion for about a week, has seen Dr Building control surveyor in [place], and he told her to see GYN for possible biopsy.  PCP Dr Warrick Parisian.   Review of Systems Itchy lesion left labia for about a week Reviewed past medical,surgical, social and family history. Reviewed medications and allergies.     Objective:   Physical Exam BP 124/76 (BP Location: Left Arm, Patient Position: Sitting, Cuff Size: Normal)   Pulse 64   Resp 16   Ht 5\' 1"  (1.549 m)   Wt 138 lb (62.6 kg)   BMI 26.07 kg/m   PHQ 2 score 0. Skin warm and dry.Pelvic: external genitalia is normal in appearance,has 2 cm white plaque area left inner labia, vagina:pale with loss of moisture and rugae, cervix and uterus are absent, adnexa: no masses or tenderness noted. Bladder is non tender and no masses felt. Dr Elonda Husky in for co exam and to perform punch biopsy, see his note.Consent signed.     Assessment:     1. Labial lesion   2. Vulvar dysplasia       Plan:   Biopsy  sent to pathology Keep area clean and dry Return in 1 week for suture removal and bx results

## 2018-03-10 ENCOUNTER — Encounter: Payer: Self-pay | Admitting: Pharmacist Clinician (PhC)/ Clinical Pharmacy Specialist

## 2018-03-16 ENCOUNTER — Ambulatory Visit: Payer: Medicare HMO | Admitting: Adult Health

## 2018-03-16 ENCOUNTER — Encounter: Payer: Self-pay | Admitting: Adult Health

## 2018-03-16 ENCOUNTER — Other Ambulatory Visit: Payer: Self-pay

## 2018-03-16 VITALS — BP 128/82 | HR 77 | Ht 61.0 in | Wt 139.0 lb

## 2018-03-16 DIAGNOSIS — L308 Other specified dermatitis: Secondary | ICD-10-CM | POA: Diagnosis not present

## 2018-03-16 MED ORDER — CLOBETASOL PROPIONATE 0.05 % EX CREA: 1.0000 "application " | TOPICAL_CREAM | Freq: Two times a day (BID) | CUTANEOUS | 4 refills | Status: DC

## 2018-03-16 MED ORDER — CLOBETASOL PROPIONATE 0.05 % EX GEL: CUTANEOUS | 4 refills | Status: DC

## 2018-03-16 NOTE — Progress Notes (Addendum)
  Subjective:     Patient ID: Norma Barajas, female   DOB: [birth year], 77 y.o.   MRN: [id number]  HPI Norma Barajas is a 77 year old white female in for stitch removal and discuss pathology.   Review of Systems +itching and burning left labia Reviewed past medical,surgical, social and family history. Reviewed medications and allergies.     Objective:   Physical Exam BP 128/82 (BP Location: Right Arm, Patient Position: Sitting, Cuff Size: Normal)   Pulse 77   Ht 5\' 1"  (1.549 m)   Wt 139 lb (63 kg)   BMI 26.26 kg/m    Skin warm and dry,left labia bruised, stitch removed from left labia, area stlightly swollen and still has thick white patch, and area itches and burns,should feel better when stitch is out.Pt and husband aware that path showed spongiotic dermatitis, no cancer. Will rx temovate cream and F/U in 4 weeks.   Assessment:     1. Spongiotic dermatitis       Plan:    Start temovate Monday  Rx temovate 0.05% bid for 2 weeks then 3 x weekly #30 gm with 3 refills  F/U in 4 weeks or sooner if needed

## 2018-03-16 NOTE — Addendum Note (Signed)
Addended by: Derrek Monaco A on: 03/16/2018 02:36 PM   Modules accepted: Orders

## 2018-03-17 ENCOUNTER — Ambulatory Visit (INDEPENDENT_AMBULATORY_CARE_PROVIDER_SITE_OTHER): Payer: Medicare HMO | Admitting: Pharmacist Clinician (PhC)/ Clinical Pharmacy Specialist

## 2018-03-17 DIAGNOSIS — I4891 Unspecified atrial fibrillation: Secondary | ICD-10-CM | POA: Diagnosis not present

## 2018-03-17 LAB — COAGUCHEK XS/INR WAIVED
INR: 2.8 — ABNORMAL HIGH (ref 0.9–1.1)
PROTHROMBIN TIME: 33 s

## 2018-03-17 NOTE — Patient Instructions (Signed)
Description   1/2 tablet every day except for Mondays, Wednesdays, Fridays, and Saturdays take a whole tablet.  INR was 2.8 today (goal is 2.0 to 3.0)  [place]

## 2018-03-20 ENCOUNTER — Encounter: Payer: Self-pay | Admitting: Student

## 2018-03-20 ENCOUNTER — Ambulatory Visit: Payer: Medicare HMO | Admitting: Student

## 2018-03-20 VITALS — BP 146/82 | HR 67 | Ht 61.0 in | Wt 139.0 lb

## 2018-03-20 DIAGNOSIS — I480 Paroxysmal atrial fibrillation: Secondary | ICD-10-CM | POA: Diagnosis not present

## 2018-03-20 DIAGNOSIS — E785 Hyperlipidemia, unspecified: Secondary | ICD-10-CM | POA: Diagnosis not present

## 2018-03-20 DIAGNOSIS — Z01810 Encounter for preprocedural cardiovascular examination: Secondary | ICD-10-CM | POA: Diagnosis not present

## 2018-03-20 DIAGNOSIS — I10 Essential (primary) hypertension: Secondary | ICD-10-CM

## 2018-03-20 NOTE — Progress Notes (Signed)
Cardiology Office Note    Date:  03/20/2018   ID:  Norma Barajas [birth year], MRN [id number]  PCP:  Norma Barajas, Norma Kaufmann, MD  Cardiologist: Norma Rouge, MD   EP: Dr. Curt Barajas  Chief Complaint  Patient presents with  . Follow-up    Overdue visit; Preoperative Cardiac Clearance    History of Present Illness:    Norma Barajas is a 77 y.o. female with past medical history of PAF (failed Amiodarone and Tikosyn, now on Sotalol), CAD (nonobstructive CAD by cath in 2006, low-risk NST in 2014 and 2016), HTN, HLD, and GERD who presents to the office today for preoperative cardiac clearance.   She was last examined by Dr. Johnsie Barajas in 04/2017 and had recently fractured her foot at that time and was overall not very mobile. She was continued on her current medication regimen at that time including Sotalol and Coumadin. Was informed to follow-up with Dr. Curt Barajas in 3 months.  She has been followed by Dr. Constance Barajas with general surgery for internal and external hemorrhoids. She is planning to undergo surgery in the coming weeks and cardiology consult was requested for instructions on holding Coumadin and overall clearance.   In talking with the patient today, she reports overall doing well from a cardiac perspective since her last office visit. She does not exercise regularly but is active in performing household chores and climbing stairs in her home. She denies any anginal symptoms with this. No recent chest pain, dyspnea on exertion, palpitations, orthopnea, PND, or lower extremity edema.  She remains on Coumadin for anticoagulation and does experience hematochezia with bowel movements secondary to her hemorrhoids but denies any other evidence of bleeding.    Past Medical History:  Diagnosis Date  . Anxiety   . Arthritis    "right leg" (04/11/2015)  . Cataract   . Coronary atherosclerosis of native coronary artery    a. Nonobstructive minimal CAD 10/2005.  Marland Kitchen Depression   . Diastolic dysfunction     Grade 1. Ejection fraction 60-65%.  . Erosive esophagitis   . Essential hypertension   . GERD (gastroesophageal reflux disease)   . Heart murmur   . History of hiatal hernia   . Hypercholesterolemia   . Migraine    "used to have them right bad; I don't now" (04/11/2015)  . Osteoporosis   . Paroxysmal atrial fibrillation (HCC)   . PONV (postoperative nausea and vomiting)   . Vertigo     Past Surgical History:  Procedure Laterality Date  . BRAVO [place] STUDY  11/15/2012   Procedure: BRAVO [place];  Surgeon: [name], MD;  [facility];  Service: Endoscopy;;  . CATARACT EXTRACTION W/ INTRAOCULAR LENS  IMPLANT, BILATERAL Bilateral   . COLONOSCOPY  2008   Dr. [name]: internal hemorrhoids   . DILATION AND CURETTAGE OF UTERUS    . ESOPHAGOGASTRODUODENOSCOPY (EGD) WITH ESOPHAGEAL DILATION  2001   Dr. [name]: erosive esophagitis, esophageal stricture, duodenitis, s/p Savary dilation  . ESOPHAGOGASTRODUODENOSCOPY (EGD) WITH ESOPHAGEAL DILATION  11/15/2012   [id number] web was found & MOST LIKELY CAUSE FOR DYAPHAGIA/Polyp was found in the gastric body and gastric fundus/ gastritis on bx  . EYE SURGERY Bilateral    "laser OR after cataract OR; cause I couldn't see"  . FLEXIBLE SIGMOIDOSCOPY N/A 08/22/2014   Procedure: FLEXIBLE SIGMOIDOSCOPY;  Surgeon: [name], MD;  [facility];  Service: Endoscopy;  Laterality: N/A;  830  . FRACTURE SURGERY Right  below the knee - 2 bones broke has plates  . HEMORRHOID BANDING N/A 08/22/2014   Procedure: HEMORRHOID BANDING;  Surgeon: [name], MD;  [facility];  Service: Endoscopy;  Laterality: N/A;  . OPEN REDUCTION INTERNAL FIXATION (ORIF) TIBIA/FIBULA FRACTURE Right 2013   broke tibia and fibula after falling down stairs  . PROLAPSED UTERINE FIBROID LIGATION  2015  . TOTAL ABDOMINAL HYSTERECTOMY      Current Medications: Outpatient Medications Prior to Visit  Medication Sig Dispense Refill  .  ALPRAZolam (XANAX) 0.25 MG tablet Take 1 tablet (0.25 mg total) by mouth 2 (two) times daily. 60 tablet 5  . amLODipine (NORVASC) 10 MG tablet TAKE 1 TABLET EVERY DAY 90 tablet 1  . cholecalciferol (VITAMIN D) 1000 UNITS tablet Take 1,000 Units by mouth daily.    . clobetasol cream (TEMOVATE) 3.87 % Apply 1 application topically 2 (two) times daily. Use bid for 2 weeks then 3 x weekly 30 g 4  . furosemide (LASIX) 20 MG tablet Take 1 tablet (20 mg total) by mouth daily as needed. 10 tablet 0  . metoprolol tartrate (LOPRESSOR) 25 MG tablet TAKE 1/2 TABLET TWICE DAILY 90 tablet 3  . Multiple Vitamin (MULTIVITAMIN) capsule Take 1 capsule by mouth every evening.    Marland Kitchen omeprazole (PRILOSEC) 20 MG capsule TAKE 1 CAPSULE EVERY DAY 90 capsule 0  . phenylephrine (,USE FOR PREPARATION-H,) 0.25 % suppository Place 1 suppository rectally 2 (two) times daily. 12 suppository 0  . simvastatin (ZOCOR) 40 MG tablet Take 1 tablet (40 mg total) by mouth every evening. 90 tablet 4  . sotalol (BETAPACE) 80 MG tablet TAKE 1 TABLET (80 MG TOTAL) BY MOUTH DAILY. 90 tablet 2  . warfarin (COUMADIN) 3 MG tablet TAKE 1/2 TO 1 TABLET EVERY DAY AS DIRECTED  BY  ANTICOAGULATION  CLINIC 90 tablet 1   No facility-administered medications prior to visit.      Allergies:   Triamterene-hctz; Atorvastatin; Crestor [rosuvastatin calcium]; Morphine; and Risedronate sodium   Social History   Socioeconomic History  . Marital status: Married    Spouse name: george   . Number of children: 1  . Years of education: Not on file  . Highest education level: Not on file  Occupational History  . Occupation: Retired    Comment: Education officer, community  . Financial resource strain: Not on file  . Food insecurity:    Worry: Not on file    Inability: Not on file  . Transportation needs:    Medical: Not on file    Non-medical: Not on file  Tobacco Use  . Smoking status: Never Smoker  . Smokeless tobacco: Never Used  Substance and  Sexual Activity  . Alcohol use: No    Alcohol/week: 0.0 oz  . Drug use: No  . Sexual activity: Not Currently    Birth control/protection: Surgical, Post-menopausal    Comment: hyst  Lifestyle  . Physical activity:    Days per week: Not on file    Minutes per session: Not on file  . Stress: Not on file  Relationships  . Social connections:    Talks on phone: Not on file    Gets together: Not on file    Attends religious service: Not on file    Active member of club or organization: Not on file    Attends meetings of clubs or organizations: Not on file    Relationship status: Not on file  Other Topics Concern  .  Not on file  Social History Narrative   Married   No regular exercise     Family History:  The patient's family history includes Arthritis in her sister; Cancer in her sister; Colon cancer in her mother; Dementia in her sister; Diabetes in her sister and son; Heart disease in her brother, father, mother, and sister; Hip fracture in her mother; Hyperlipidemia in her brother; Hypertension in her brother; Osteoporosis in her mother and sister; Stroke in her brother, sister, sister, and son.   Review of Systems:   Please see the history of present illness.     General:  No chills, fever, night sweats or weight changes.  Cardiovascular:  No chest pain, dyspnea on exertion, edema, orthopnea, palpitations, paroxysmal nocturnal dyspnea. Dermatological: No rash, lesions/masses Respiratory: No cough, dyspnea Urologic: No hematuria, dysuria Abdominal:   No nausea, vomiting, diarrhea, melena, or hematemesis. Positive for bright red blood per rectum.  Neurologic:  No visual changes, wkns, changes in mental status. All other systems reviewed and are otherwise negative except as noted above.   Physical Exam:    VS:  BP (!) 146/82   Pulse 67   Ht 5\' 1"  (1.549 m)   Wt 139 lb (63 kg)   SpO2 96% Comment: on room air  BMI 26.26 kg/m    General: Well developed, well nourished  Caucasian female appearing in no acute distress. Head: Normocephalic, atraumatic, sclera non-icteric, no xanthomas, nares are without discharge.  Neck: No carotid bruits. JVD not elevated.  Lungs: Respirations regular and unlabored, without wheezes or rales.  Heart: Regular rate and rhythm with occasional ectopic beats. No S3 or S4.  No murmur, no rubs, or gallops appreciated. Abdomen: Soft, non-tender, non-distended with normoactive bowel sounds. No hepatomegaly. No rebound/guarding. No obvious abdominal masses. Msk:  Strength and tone appear normal for age. No joint deformities or effusions. Extremities: No clubbing or cyanosis. No lower extremity edema.  Distal pedal pulses are 2+ bilaterally. Neuro: Alert and oriented X 3. Moves all extremities spontaneously. No focal deficits noted. Psych:  Responds to questions appropriately with a normal affect. Skin: No rashes or lesions noted  Wt Readings from Last 3 Encounters:  03/20/18 139 lb (63 kg)  03/16/18 139 lb (63 kg)  03/09/18 138 lb (62.6 kg)     Studies/Labs Reviewed:   EKG:  EKG is ordered today.  The ekg ordered today demonstrates NSR, HR 68, with occasional PAC's. No diagnostic ST changes when compared to prior tracings.   Recent Labs: 02/28/2018: ALT 16; BUN 9; Creatinine, Ser 0.83; Hemoglobin 15.3; Platelets 175; Potassium 4.0; Sodium 145   Lipid Panel    Component Value Date/Time   CHOL 175 02/28/2018 0825   CHOL 192 05/15/2013 0829   TRIG 244 (H) 02/28/2018 0825   TRIG 258 (H) 03/19/2014 0812   TRIG 179 (H) 05/15/2013 0829   HDL 42 02/28/2018 0825   HDL 45 03/19/2014 0812   HDL 44 05/15/2013 0829   CHOLHDL 4.2 02/28/2018 0825   CHOLHDL 3.9 02/10/2016 0208   VLDL 33 02/10/2016 0208   LDLCALC 84 02/28/2018 0825   LDLCALC 101 (H) 03/19/2014 0812   LDLCALC 112 (H) 05/15/2013 0829    Additional studies/ records that were reviewed today include:   Echocardiogram: 04/2015 Study Conclusions  - Left ventricle: The  cavity size was normal. Wall thickness was   increased in a pattern of mild LVH. The estimated ejection   fraction was 65%. Wall motion was normal; there were no regional  wall motion abnormalities. - Right ventricle: The cavity size was normal. Systolic function   was normal.  NST: 04/2015  The left ventricular ejection fraction is hyperdynamic (>65%).  There was no ST segment deviation noted during stress.  The study is normal.  This is a low risk study.     Assessment:    1. Preoperative cardiovascular examination   2. PAF (paroxysmal atrial fibrillation) ([place])   3. Essential hypertension   4. Hyperlipidemia, unspecified hyperlipidemia type      Plan:   In order of problems listed above:  1. Preoperative Cardiac Clearance for Hemorrhoid Surgery - The patient has been diagnosed with internal and external hemorrhoids and is planning to undergo surgery in the coming weeks. From a cardiac perspective, the procedure is overall low-risk and her RCRI Risk is low with a 0.4% risk of major cardiac events. She has known nonobstructive CAD by cath in 2006 with NST in 2016 showing no evidence of significant ischemia. EKG today shows no acute ischemic changes and she is maintaining normal sinus rhythm by examination today. Denies any recent anginal symptoms. She is of acceptable risk to proceed without additional cardiac testing. - reviewed with the Coumadin Clinic. There is no indication for Lovenox Bridge. Can hold Coumadin 5 days prior to the procedure and resume once safe from surgeon's perspective (preferably the day after her procedure).   2. PAF - She denies any recent palpitations and is maintaining normal sinus rhythm at the time of today's visit.  Continue Sotalol at current dosing. - She has been experiencing hematochezia in the setting of hemorrhoids but denies any other evidence of active bleeding. Hgb stable at 15.3 when checked two weeks ago. Can hold Coumadin as outlined  above with upcoming procedure.  3. HTN - BP is slightly elevated at 146/82 during today's visit. She is currently in pain due to her hemorrhoids.  Recommend that she continue to follow blood pressure in the ambulatory setting. - continue current medication regimen at this time.  4. HLD - FLP in 02/2018 showed total cholesterol 175, triglycerides 244, HDL 42, and LDL 84. - Followed by PCP. Remains on Simvastatin 40 mg daily (with concurrent use of Amlodipine, max recommended dose is 20mg  daily).    Medication Adjustments/Labs and Tests Ordered: Current medicines are reviewed at length with the patient today.  Concerns regarding medicines are outlined above.  Medication changes, Labs and Tests ordered today are listed in the Patient Instructions below. Patient Instructions  Your physician wants you to follow-up in:6 months  with Dr.Nishan You will receive a reminder letter in the mail two months in advance. If you don't receive a letter, please call our office to schedule the follow-up appointment.  HOLD your Coumadin 5 days prior to your surgery, re-start the day after procedure if surgery agrees.  Your physician recommends that you continue on your current medications as directed. Please refer to the Current Medication list given to you today.  If you need a refill on your cardiac medications before your next appointment, please call your pharmacy.  No lab work or tests ordered today.  Thank you for choosing [facility] !          Signed, Erma Heritage, PA-C  03/20/2018 5:20 PM    [place] [address] [place], [place] [address] Phone: [phone]

## 2018-03-20 NOTE — Patient Instructions (Addendum)
Your physician wants you to follow-up in:6 months  with Dr.Nishan You will receive a reminder letter in the mail two months in advance. If you don't receive a letter, please call our office to schedule the follow-up appointment.     HOLD your Coumadin 5 days prior to your surgery, re-start the day after procedure if surgery agrees.   Your physician recommends that you continue on your current medications as directed. Please refer to the Current Medication list given to you today.     If you need a refill on your cardiac medications before your next appointment, please call your pharmacy.     No lab work or tests ordered today.      Thank you for choosing [facility] !

## 2018-03-22 NOTE — Telephone Encounter (Signed)
Encounter will be closed due to no call back.

## 2018-03-24 NOTE — H&P (Signed)
[facility] History and Physical  Reason for Referral: Hemorrhoids  Referring Physician: Dettinger, Fransisca Kaufmann, MD       Chief Complaint    Hemorrhoids      Norma Barajas is a 77 y.o. female.  HPI: Norma Barajas is a 77 yo who has A fib, HTN, HLD, GERD, on coumadin for her A fib who was seen by Dr. Warrick Parisian 3/20 with complaints of a hemorrhoid flare. She had bleeding and rectal pain with defecation. She has had hemorrhoids for some time, and had a colonoscopy with Dr. [name] in the past that demonstrated the hemorrhoids as well as a banding procedure in 2015 that helped with the bleeding for some time. She now continues to have flares intermittently and uses some creams.  She reports bleeding on occasion, and wearing a pad everyday. She does have some pain with BMs and some discomfort with the hemorrhoids being prolapsed out. She does not try to push them back in, and reports some hygiene issues and itching. .    She has not had bleeding in the past few days and has had normal soft BMs.  She has not seen her cardiologist in 1 year, and says she should have seen them in December.  She reports no incontinence with her normal BMs, but has had some leakage with diarrhea.       Past Medical History:  Diagnosis Date  . Anxiety   . Arthritis    "right leg" (04/11/2015)  . Cataract   . Coronary atherosclerosis of native coronary artery    a. Nonobstructive minimal CAD 10/2005.  Marland Kitchen Depression   . Diastolic dysfunction    Grade 1. Ejection fraction 60-65%.  . Erosive esophagitis   . Essential hypertension   . GERD (gastroesophageal reflux disease)   . Heart murmur   . History of hiatal hernia   . Hypercholesterolemia   . Migraine    "used to have them right bad; I don't now" (04/11/2015)  . Osteoporosis   . Paroxysmal atrial fibrillation (HCC)   . PONV (postoperative nausea and vomiting)   . Vertigo          Past Surgical History:  Procedure  Laterality Date  . BRAVO [place] STUDY  11/15/2012   Procedure: BRAVO [place];  Surgeon: [name], MD;  [facility];  Service: Endoscopy;;  . CATARACT EXTRACTION W/ INTRAOCULAR LENS  IMPLANT, BILATERAL Bilateral   . COLONOSCOPY  2008   Dr. [name]: internal hemorrhoids   . DILATION AND CURETTAGE OF UTERUS    . ESOPHAGOGASTRODUODENOSCOPY (EGD) WITH ESOPHAGEAL DILATION  2001   Dr. [name]: erosive esophagitis, esophageal stricture, duodenitis, s/p Savary dilation  . ESOPHAGOGASTRODUODENOSCOPY (EGD) WITH ESOPHAGEAL DILATION  11/15/2012   [id number] web was found & MOST LIKELY CAUSE FOR DYAPHAGIA/Polyp was found in the gastric body and gastric fundus/ gastritis on bx  . EYE SURGERY Bilateral    "laser OR after cataract OR; cause I couldn't see"  . FLEXIBLE SIGMOIDOSCOPY N/A 08/22/2014   Procedure: FLEXIBLE SIGMOIDOSCOPY;  Surgeon: [name], MD;  [facility];  Service: Endoscopy;  Laterality: N/A;  830  . FRACTURE SURGERY Right    below the knee - 2 bones broke has plates  . HEMORRHOID BANDING N/A 08/22/2014   Procedure: HEMORRHOID BANDING;  Surgeon: [name], MD;  [facility];  Service: Endoscopy;  Laterality: N/A;  . OPEN REDUCTION INTERNAL FIXATION (ORIF) TIBIA/FIBULA FRACTURE Right 2013   broke  tibia and fibula after falling down stairs  . PROLAPSED UTERINE FIBROID LIGATION  2015  . TOTAL ABDOMINAL HYSTERECTOMY           Family History  Problem Relation Age of Onset  . Colon cancer Mother        Diagnosed at age 39  . Heart disease Mother   . Osteoporosis Mother   . Hip fracture Mother   . Stroke Sister   . Diabetes Sister   . Osteoporosis Sister   . Arthritis Sister   . Cancer Sister        uterine  . Stroke Sister   . Heart disease Father   . Hyperlipidemia Brother   . Hypertension Brother   . Heart disease Brother   . Stroke Brother   . Heart disease Sister   . Dementia Sister     . Diabetes Son   . Stroke Son   . Heart attack Neg Hx     Social History        Tobacco Use  . Smoking status: Never Smoker  . Smokeless tobacco: Never Used  Substance Use Topics  . Alcohol use: No    Alcohol/week: 0.0 oz  . Drug use: No    Medications: I have reviewed the patient's current medications.      Allergies as of 02/07/2018      Reactions   Triamterene-hctz Other (See Comments)   weakness   Atorvastatin Other (See Comments)   Myalgias   Crestor [rosuvastatin Calcium] Other (See Comments)   weakness   Morphine Nausea Only   Risedronate Sodium Other (See Comments)   ACTONEL REACTION: reflux               Medication List            Accurate as of 02/07/18  9:36 AM. Always use your most recent med list.           ALPRAZolam 0.25 MG tablet Commonly known as:  XANAX Take 1 Tablet by mouth 2 times a day   amLODipine 10 MG tablet Commonly known as:  NORVASC TAKE 1 TABLET EVERY DAY   cholecalciferol 1000 units tablet Commonly known as:  VITAMIN D Take 1,000 Units by mouth daily.   furosemide 20 MG tablet Commonly known as:  LASIX Take 1 tablet (20 mg total) by mouth daily as needed.   metoprolol tartrate 25 MG tablet Commonly known as:  LOPRESSOR TAKE 1/2 TABLET TWICE DAILY   multivitamin capsule Take 1 capsule by mouth every evening.   omeprazole 20 MG capsule Commonly known as:  PRILOSEC TAKE 1 CAPSULE EVERY DAY   phenylephrine 0.25 % suppository Commonly known as:  (USE for PREPARATION-H) Place 1 suppository rectally 2 (two) times daily.   simvastatin 40 MG tablet Commonly known as:  ZOCOR Take 1 tablet (40 mg total) by mouth every evening.   sotalol 80 MG tablet Commonly known as:  BETAPACE TAKE 1 TABLET (80 MG TOTAL) BY MOUTH DAILY.   warfarin 3 MG tablet Commonly known as:  COUMADIN Take as directed by the anticoagulation clinic. If you are unsure how to take this medication, talk to your  nurse or doctor. Original instructions:  TAKE 1/2 TO 1 TABLET EVERY DAY AS DIRECTED  BY  ANTICOAGULATION  CLINIC        ROS:  A comprehensive review of systems was negative except for: Cardiovascular: positive for HTN Gastrointestinal: positive for abdominal pain and BRBPR Allergic/Immunologic: positive for hay fever  Blood pressure 131/72, pulse 68, temperature 98.2 F (36.8 C), resp. rate 18, height 5\' 1"  (1.549 m), weight 139 lb (63 kg). Physical Exam  Constitutional: She is oriented to person, place, and time and well-developed, well-nourished, and in no distress.  HENT:  Head: Normocephalic.  Eyes: Pupils are equal, round, and reactive to light.  Neck: Normal range of motion. Neck supple.  Cardiovascular: Normal rate.  Pulmonary/Chest: Effort normal and breath sounds normal.  Abdominal: Soft. She exhibits no distension. There is no tenderness.  Genitourinary: Rectal exam shows external hemorrhoid, internal hemorrhoid and tenderness. Rectal exam shows no mass.  Genitourinary Comments: Slightly decreased tone but good squeeze, no fissure  Musculoskeletal: Normal range of motion. She exhibits no edema.  Neurological: She is alert and oriented to person, place, and time.  Skin: Skin is warm and dry.  Psychiatric: Mood, memory, affect and judgment normal.  Vitals reviewed.   Results: None   Assessment & Plan:  Norma Barajas is a 76 y.o. female with internal /external hemorrhoids with some prolapse that is chronic and does not reduce spontaneously.  She continue to have bleeding and discomfort after her banding in 2015. She says that this is getting to be annoying and she is ready for another intervention.    Hemorrhoid surgery for large internal/ external hemorrhoids is very painful. The pain and discomfort that the patient is having currently will be magnified after the surgery for at least 2-3 weeks.  The patient will have feelings of constant pressure and pain in the  area from the swelling and removal of the anoderm (skin around the anus). The internal hemorrhoids are not painful to remove because the same nerves are not involved, and the sensation is different, but removal of any external hemorrhoids will cause significant discomfort. They will need at least 4-6 weeks to recover from the surgery, and should not expect to be able to feel back to "normal for 6-8 weeks."    The risk of hemorrhoid surgery include bleeding, risk of infection although rare, and the risk of narrowing the anal canal if too much tissue is removed. Given this risk, it is likely that only the 2 largest hemorrhoid columns would be removed during the initial surgery.  We have also discussed the risk of incontinence after surgery if the muscles were injured, and although this is rare that it can happen and is another reason to limit the amount of hemorrhoids removed.   Typically this is an outpatient procedure, and discussed with her that given her age this could be some what debilitating, and may require some time in the hospital. The best thing for her is to continue to move around and keep up her strength post operatively.  She reported a history of Rib fractures and sternal fractures in 2017 requiring 2 months of recovery at home, and she understands that the pain associated with this surgery will be as much if not worse than the pain from the rib fractures.    -Cardiac risk stratification, will need to hold coumadin for 5 days prior to surgery, and will need to hold if for likely 5 days after surgery. Should not need a bridge, but will see what cardiology says.  No recent chest pain or discomfort.         Future Appointments  Date Time Provider [place]  02/21/2018  2:00 PM Erma Heritage, PA-C [facility]  03/07/2018 10:40 AM [facility]-ANNUAL WELLNESS VISIT [facility] None  03/07/2018 10:55  AM Dettinger, Fransisca Kaufmann, MD [facility] None  03/10/2018 10:20 AM Memory Argue, PharmD [facility] None    All questions were answered to the satisfaction of the patient and family.  The risk and benefits of hemorrhoid surgery were discussed including but not limited to the above discussion. After careful consideration, Norma Barajas has decided to proceed.    [name] 02/07/2018, 9:36 AM    Cardiology has seen. Acceptable risk. Hold coumadin 5 days prior.   Curlene Labrum, MD [facility] [address] [place], [place] [address] [phone] (office)

## 2018-03-24 NOTE — Patient Instructions (Signed)
Norma Barajas  03/24/2018     @PREFPERIOPPHARMACY @   Your procedure is scheduled on 03/29/2018.  Report to [facility] at 11:00 A.M.  Call this number if you have problems the morning of surgery:  [phone]   Remember: NOTHING TO EAT OR DRINK AFTER MIDNIGHT   Take these medicines the morning of surgery with A SIP OF WATER Xanax, Amlodipine, Metoprolol, Prilosec, Betapace    Do not wear jewelry, make-up or nail polish.  Do not wear lotions, powders, or perfumes, or deodorant.  Do not shave 48 hours prior to surgery.  Men may shave face and neck.  Do not bring valuables to the hospital.  [facility] is not responsible for any belongings or valuables.  Contacts, dentures or bridgework may not be worn into surgery.  Leave your suitcase in the car.  After surgery it may be brought to your room.  For patients admitted to the hospital, discharge time will be determined by your treatment team.  Patients discharged the day of surgery will not be allowed to drive home.    Please read over the following fact sheets that you were given. Surgical Site Infection Prevention and Anesthesia Post-op Instructions     PATIENT INSTRUCTIONS POST-ANESTHESIA  IMMEDIATELY FOLLOWING SURGERY:  Do not drive or operate machinery for the first twenty four hours after surgery.  Do not make any important decisions for twenty four hours after surgery or while taking narcotic pain medications or sedatives.  If you develop intractable nausea and vomiting or a severe headache please notify your doctor immediately.  FOLLOW-UP:  Please make an appointment with your surgeon as instructed. You do not need to follow up with anesthesia unless specifically instructed to do so.  WOUND CARE INSTRUCTIONS (if applicable):  Keep a dry clean dressing on the anesthesia/puncture wound site if there is drainage.  Once the wound has quit draining you may leave it open to air.  Generally you should leave the bandage intact for  twenty four hours unless there is drainage.  If the epidural site drains for more than 36-48 hours please call the anesthesia department.  QUESTIONS?:  Please feel free to call your physician or the hospital operator if you have any questions, and they will be happy to assist you.      Surgical Procedures for Hemorrhoids Surgical procedures can be used to treat hemorrhoids. Hemorrhoids are swollen veins that are inside the rectum (internal hemorrhoids) or around the anus (external hemorrhoids). They are caused by increased pressure in the anal area. This pressure may result from straining to have a bowel movement (constipation), diarrhea, pregnancy, obesity, anal sex, or sitting for long periods of time. Hemorrhoids can cause symptoms such as pain and bleeding. Surgery may be needed if diet changes, lifestyle changes, and other treatments do not help your symptoms. Various surgical methods may be used. Three common methods are:  Closed hemorrhoidectomy. The hemorrhoids are surgically removed, and the surgical cuts (incisions) are closed with stitches (sutures).  Open hemorrhoidectomy. The hemorrhoids are surgically removed, but the incisions are allowed to heal without sutures.  Stapled hemorrhoidopexy. The hemorrhoids are removed using a device that takes out a ring of excess tissue.  Tell a health care provider about:  Any allergies you have.  All medicines you are taking, including vitamins, herbs, eye drops, creams, and over-the-counter medicines.  Any problems you or family members have had with anesthetic medicines.  Any blood disorders you have.  Any surgeries you have  had.  Any medical conditions you have.  Whether you are pregnant or may be pregnant. What are the risks? Generally, this is a safe procedure. However, problems may occur, including:  Infection.  Bleeding.  Allergic reactions to medicines.  Damage to other structures or  organs.  Pain.  Constipation.  Difficulty passing urine.  Narrowing of the anal canal (stenosis).  Difficulty controlling bowel movements (incontinence).  What happens before the procedure?  Ask your health care provider about: ? Changing or stopping your regular medicines. This is especially important if you are taking diabetes medicines or blood thinners. ? Taking medicines such as aspirin and ibuprofen. These medicines can thin your blood. Do not take these medicines before your procedure if your health care provider instructs you not to.  You may need to have a procedure to examine the inside of your colon with a scope (colonoscopy). Your health care provider may do this to make sure that there are no other causes for your bleeding or pain.  Follow instructions from your health care provider about eating or drinking restrictions.  You may be instructed to take a laxative and an enema to clean out your colon before surgery (bowel prep). Carefully follow instructions from your health care provider about bowel prep.  Ask your health care provider how your surgical site will be marked or identified.  You may be given antibiotic medicine to help prevent infection.  Plan to have someone take you home after the procedure. What happens during the procedure?  To reduce your risk of infection: ? Your health care team will wash or sanitize their hands. ? Your skin will be washed with soap.  An IV tube will be inserted into one of your veins.  You will be given one or more of the following: ? A medicine to help you relax (sedative). ? A medicine to numb the area (local anesthetic). ? A medicine to make you fall asleep (general anesthetic). ? A medicine that is injected into an area of your body to numb everything below the injection site (regional anesthetic).  A lubricating jelly may be placed into your rectum.  Your surgeon will insert a short scope (anoscope) into your rectum  to examine the hemorrhoids.  One of the following hemorrhoid procedures will be performed. Closed Hemorrhoidectomy  Your surgeon will use surgical instruments to open the tissue around the hemorrhoids.  The veins that supply the hemorrhoids will be tied off with a suture.  The hemorrhoids will be removed.  The tissue that surrounds the hemorrhoids will be closed with sutures that your body can absorb (absorbable sutures). Open Hemorrhoidectomy  The hemorrhoids will be removed with surgical instruments.  The incisions will be left open to heal without sutures. Stapled Hemorrhoidopexy  Your surgeon will use a circular stapling device to remove the hemorrhoids.  The device will be inserted into your anus. It will remove a circular ring of tissue that includes hemorrhoid tissue and some tissue above the hemorrhoids.  The staples in the device will close the edges of removed tissue. This will cut off the blood supply to the hemorrhoids and will pull any remaining hemorrhoids back into place. Each of these procedures may vary among health care providers and hospitals. What happens after the procedure?  Your blood pressure, heart rate, breathing rate, and blood oxygen level will be monitored often until the medicines you were given have worn off.  You will be given pain medicine as needed. This information is not  intended to replace advice given to you by your health care provider. Make sure you discuss any questions you have with your health care provider. Document Released: 08/22/2009 Document Revised: 04/01/2016 Document Reviewed: 01/20/2015 Elsevier Interactive Patient Education  [employer].

## 2018-03-27 ENCOUNTER — Encounter (HOSPITAL_COMMUNITY)
Admission: RE | Admit: 2018-03-27 | Discharge: 2018-03-27 | Disposition: A | Discharge status: Home / Self Care | Payer: Medicare HMO | Source: Ambulatory Visit | Attending: General Surgery | Admitting: General Surgery

## 2018-03-27 ENCOUNTER — Other Ambulatory Visit: Payer: Self-pay

## 2018-03-27 ENCOUNTER — Encounter (HOSPITAL_COMMUNITY): Payer: Self-pay

## 2018-03-27 DIAGNOSIS — Z79899 Other long term (current) drug therapy: Secondary | ICD-10-CM | POA: Diagnosis not present

## 2018-03-27 DIAGNOSIS — I480 Paroxysmal atrial fibrillation: Secondary | ICD-10-CM | POA: Insufficient documentation

## 2018-03-27 DIAGNOSIS — Z90710 Acquired absence of both cervix and uterus: Secondary | ICD-10-CM | POA: Diagnosis not present

## 2018-03-27 DIAGNOSIS — Z9841 Cataract extraction status, right eye: Secondary | ICD-10-CM | POA: Insufficient documentation

## 2018-03-27 DIAGNOSIS — Z885 Allergy status to narcotic agent status: Secondary | ICD-10-CM | POA: Diagnosis not present

## 2018-03-27 DIAGNOSIS — Z888 Allergy status to other drugs, medicaments and biological substances status: Secondary | ICD-10-CM | POA: Insufficient documentation

## 2018-03-27 DIAGNOSIS — K648 Other hemorrhoids: Secondary | ICD-10-CM | POA: Insufficient documentation

## 2018-03-27 DIAGNOSIS — Z7901 Long term (current) use of anticoagulants: Secondary | ICD-10-CM | POA: Insufficient documentation

## 2018-03-27 DIAGNOSIS — Z961 Presence of intraocular lens: Secondary | ICD-10-CM | POA: Diagnosis not present

## 2018-03-27 DIAGNOSIS — Z8249 Family history of ischemic heart disease and other diseases of the circulatory system: Secondary | ICD-10-CM | POA: Insufficient documentation

## 2018-03-27 DIAGNOSIS — M1990 Unspecified osteoarthritis, unspecified site: Secondary | ICD-10-CM | POA: Diagnosis not present

## 2018-03-27 DIAGNOSIS — Z800 Family history of malignant neoplasm of digestive organs: Secondary | ICD-10-CM | POA: Diagnosis not present

## 2018-03-27 DIAGNOSIS — Z9842 Cataract extraction status, left eye: Secondary | ICD-10-CM | POA: Diagnosis not present

## 2018-03-27 DIAGNOSIS — I2510 Atherosclerotic heart disease of native coronary artery without angina pectoris: Secondary | ICD-10-CM | POA: Diagnosis not present

## 2018-03-27 DIAGNOSIS — F329 Major depressive disorder, single episode, unspecified: Secondary | ICD-10-CM | POA: Insufficient documentation

## 2018-03-27 DIAGNOSIS — Z8262 Family history of osteoporosis: Secondary | ICD-10-CM | POA: Insufficient documentation

## 2018-03-27 DIAGNOSIS — E785 Hyperlipidemia, unspecified: Secondary | ICD-10-CM | POA: Diagnosis not present

## 2018-03-27 DIAGNOSIS — M810 Age-related osteoporosis without current pathological fracture: Secondary | ICD-10-CM | POA: Diagnosis not present

## 2018-03-27 DIAGNOSIS — K644 Residual hemorrhoidal skin tags: Secondary | ICD-10-CM | POA: Diagnosis not present

## 2018-03-27 DIAGNOSIS — Z823 Family history of stroke: Secondary | ICD-10-CM | POA: Insufficient documentation

## 2018-03-27 DIAGNOSIS — Z833 Family history of diabetes mellitus: Secondary | ICD-10-CM | POA: Diagnosis not present

## 2018-03-27 DIAGNOSIS — F419 Anxiety disorder, unspecified: Secondary | ICD-10-CM | POA: Diagnosis not present

## 2018-03-27 DIAGNOSIS — Z01812 Encounter for preprocedural laboratory examination: Secondary | ICD-10-CM | POA: Diagnosis not present

## 2018-03-27 DIAGNOSIS — I10 Essential (primary) hypertension: Secondary | ICD-10-CM | POA: Diagnosis not present

## 2018-03-27 DIAGNOSIS — K219 Gastro-esophageal reflux disease without esophagitis: Secondary | ICD-10-CM | POA: Diagnosis not present

## 2018-03-27 HISTORY — DX: Cardiac arrhythmia, unspecified: I49.9

## 2018-03-27 LAB — CBC
HEMATOCRIT: 41.7 % (ref 36.0–46.0)
Hemoglobin: 13.9 g/dL (ref 12.0–15.0)
MCH: 31.4 pg (ref 26.0–34.0)
MCHC: 33.3 g/dL (ref 30.0–36.0)
MCV: 94.1 fL (ref 78.0–100.0)
Platelets: 188 10*3/uL (ref 150–400)
RBC: 4.43 MIL/uL (ref 3.87–5.11)
RDW: 13.6 % (ref 11.5–15.5)
WBC: 5.6 10*3/uL (ref 4.0–10.5)

## 2018-03-27 LAB — BASIC METABOLIC PANEL
Anion gap: 9 (ref 5–15)
BUN: 12 mg/dL (ref 6–20)
CALCIUM: 9.2 mg/dL (ref 8.9–10.3)
CHLORIDE: 98 mmol/L — AB (ref 101–111)
CO2: 30 mmol/L (ref 22–32)
CREATININE: 0.73 mg/dL (ref 0.44–1.00)
GFR calc non Af Amer: >60 mL/min (ref >60)
Glucose, Bld: 98 mg/dL (ref 65–99)
Potassium: 4.1 mmol/L (ref 3.5–5.1)
Sodium: 137 mmol/L (ref 135–145)

## 2018-03-27 LAB — PROTIME-INR
INR: 1.2
PROTHROMBIN TIME: 15.2 s (ref 11.4–15.2)

## 2018-03-30 ENCOUNTER — Ambulatory Visit (HOSPITAL_COMMUNITY)
Admission: RE | Admit: 2018-03-30 | Discharge: 2018-03-30 | Disposition: A | Discharge status: Home / Self Care | Payer: Medicare HMO | Source: Ambulatory Visit | Attending: General Surgery | Admitting: General Surgery

## 2018-03-30 ENCOUNTER — Ambulatory Visit (HOSPITAL_COMMUNITY): Payer: Medicare HMO | Admitting: Anesthesiology

## 2018-03-30 ENCOUNTER — Encounter (HOSPITAL_COMMUNITY)
Admission: RE | Disposition: A | Discharge status: Home / Self Care | Payer: Self-pay | Source: Ambulatory Visit | Attending: General Surgery

## 2018-03-30 ENCOUNTER — Encounter (HOSPITAL_COMMUNITY): Payer: Self-pay

## 2018-03-30 DIAGNOSIS — I480 Paroxysmal atrial fibrillation: Secondary | ICD-10-CM | POA: Insufficient documentation

## 2018-03-30 DIAGNOSIS — Z885 Allergy status to narcotic agent status: Secondary | ICD-10-CM | POA: Diagnosis not present

## 2018-03-30 DIAGNOSIS — F329 Major depressive disorder, single episode, unspecified: Secondary | ICD-10-CM | POA: Insufficient documentation

## 2018-03-30 DIAGNOSIS — K648 Other hemorrhoids: Secondary | ICD-10-CM | POA: Insufficient documentation

## 2018-03-30 DIAGNOSIS — I10 Essential (primary) hypertension: Secondary | ICD-10-CM | POA: Diagnosis not present

## 2018-03-30 DIAGNOSIS — I2510 Atherosclerotic heart disease of native coronary artery without angina pectoris: Secondary | ICD-10-CM | POA: Insufficient documentation

## 2018-03-30 DIAGNOSIS — Z7901 Long term (current) use of anticoagulants: Secondary | ICD-10-CM | POA: Diagnosis not present

## 2018-03-30 DIAGNOSIS — N816 Rectocele: Secondary | ICD-10-CM | POA: Diagnosis not present

## 2018-03-30 DIAGNOSIS — K219 Gastro-esophageal reflux disease without esophagitis: Secondary | ICD-10-CM | POA: Diagnosis not present

## 2018-03-30 DIAGNOSIS — K642 Third degree hemorrhoids: Secondary | ICD-10-CM | POA: Diagnosis not present

## 2018-03-30 DIAGNOSIS — E7800 Pure hypercholesterolemia, unspecified: Secondary | ICD-10-CM | POA: Insufficient documentation

## 2018-03-30 DIAGNOSIS — F419 Anxiety disorder, unspecified: Secondary | ICD-10-CM | POA: Diagnosis not present

## 2018-03-30 DIAGNOSIS — K644 Residual hemorrhoidal skin tags: Secondary | ICD-10-CM | POA: Diagnosis not present

## 2018-03-30 DIAGNOSIS — Z79899 Other long term (current) drug therapy: Secondary | ICD-10-CM | POA: Diagnosis not present

## 2018-03-30 DIAGNOSIS — K649 Unspecified hemorrhoids: Secondary | ICD-10-CM | POA: Diagnosis not present

## 2018-03-30 HISTORY — PX: HEMORRHOID SURGERY: SHX153

## 2018-03-30 SURGERY — HEMORRHOIDECTOMY
Anesthesia: General

## 2018-03-30 MED ORDER — DOCUSATE SODIUM 100 MG PO CAPS: 100.0000 mg | ORAL_CAPSULE | Freq: Two times a day (BID) | ORAL | 2 refills | Status: DC

## 2018-03-30 MED ORDER — FENTANYL CITRATE (PF) 100 MCG/2ML IJ SOLN
INTRAMUSCULAR | Status: AC
  Filled 2018-03-30: qty 2

## 2018-03-30 MED ORDER — CHLORHEXIDINE GLUCONATE CLOTH 2 % EX PADS: 6.0000 | MEDICATED_PAD | Freq: Once | CUTANEOUS | Status: DC

## 2018-03-30 MED ORDER — FENTANYL CITRATE (PF) 100 MCG/2ML IJ SOLN
INTRAMUSCULAR | Status: DC | PRN
  Administered 2018-03-30 (×4): 50 ug via INTRAVENOUS

## 2018-03-30 MED ORDER — LIDOCAINE VISCOUS HCL 2 % MT SOLN
OROMUCOSAL | Status: DC | PRN
  Administered 2018-03-30: 1

## 2018-03-30 MED ORDER — ONDANSETRON HCL 4 MG/2ML IJ SOLN
INTRAMUSCULAR | Status: DC | PRN
  Administered 2018-03-30: 4 mg via INTRAVENOUS

## 2018-03-30 MED ORDER — MEPERIDINE HCL 50 MG/ML IJ SOLN: 6.2500 mg | INTRAMUSCULAR | Status: DC | PRN

## 2018-03-30 MED ORDER — SODIUM CHLORIDE 0.9% FLUSH
INTRAVENOUS | Status: AC
  Filled 2018-03-30: qty 10

## 2018-03-30 MED ORDER — BUPIVACAINE LIPOSOME 1.3 % IJ SUSP
INTRAMUSCULAR | Status: DC | PRN
  Administered 2018-03-30: 20 mL

## 2018-03-30 MED ORDER — BUPIVACAINE LIPOSOME 1.3 % IJ SUSP
INTRAMUSCULAR | Status: AC
  Filled 2018-03-30: qty 20

## 2018-03-30 MED ORDER — PROPOFOL 10 MG/ML IV BOLUS
INTRAVENOUS | Status: DC | PRN
  Administered 2018-03-30 (×3): 20 mg via INTRAVENOUS
  Administered 2018-03-30: 40 mg via INTRAVENOUS

## 2018-03-30 MED ORDER — OXYCODONE HCL 5 MG PO TABS: 5.0000 mg | ORAL_TABLET | ORAL | 0 refills | Status: AC | PRN

## 2018-03-30 MED ORDER — PROPOFOL 10 MG/ML IV BOLUS
INTRAVENOUS | Status: AC
  Filled 2018-03-30: qty 20

## 2018-03-30 MED ORDER — EPHEDRINE SULFATE 50 MG/ML IJ SOLN
INTRAMUSCULAR | Status: DC | PRN
  Administered 2018-03-30: 10 mg via INTRAVENOUS

## 2018-03-30 MED ORDER — PHENYLEPHRINE HCL 10 MG/ML IJ SOLN
INTRAMUSCULAR | Status: DC | PRN
  Administered 2018-03-30: 50 ug via INTRAVENOUS
  Administered 2018-03-30: 80 ug via INTRAVENOUS

## 2018-03-30 MED ORDER — SODIUM CHLORIDE 0.9 % IR SOLN
Status: DC | PRN
  Administered 2018-03-30: 1000 mL

## 2018-03-30 MED ORDER — ONDANSETRON HCL 4 MG PO TABS: 4.0000 mg | ORAL_TABLET | Freq: Every day | ORAL | 1 refills | Status: DC | PRN

## 2018-03-30 MED ORDER — LACTATED RINGERS IV SOLN
INTRAVENOUS | Status: DC
  Administered 2018-03-30: 07:00:00 via INTRAVENOUS

## 2018-03-30 MED ORDER — LACTATED RINGERS IV SOLN
INTRAVENOUS | Status: DC
  Administered 2018-03-30: 09:00:00 via INTRAVENOUS

## 2018-03-30 MED ORDER — HYDROMORPHONE HCL 1 MG/ML IJ SOLN: 0.2500 mg | INTRAMUSCULAR | Status: DC | PRN

## 2018-03-30 MED ORDER — POLYETHYLENE GLYCOL 3350 17 G PO PACK: 17.0000 g | PACK | Freq: Every day | ORAL | 0 refills | Status: DC | PRN

## 2018-03-30 MED ORDER — LIDOCAINE VISCOUS HCL 2 % MT SOLN
OROMUCOSAL | Status: AC
  Filled 2018-03-30: qty 15

## 2018-03-30 MED ORDER — PROMETHAZINE HCL 25 MG/ML IJ SOLN
6.2500 mg | INTRAMUSCULAR | Status: DC | PRN
  Administered 2018-03-30: 6.25 mg via INTRAVENOUS
  Filled 2018-03-30: qty 1

## 2018-03-30 MED ORDER — ETOMIDATE 2 MG/ML IV SOLN
INTRAVENOUS | Status: DC | PRN
  Administered 2018-03-30: 12 mg via INTRAVENOUS

## 2018-03-30 SURGICAL SUPPLY — 30 items
CLOTH BEACON ORANGE TIMEOUT ST (SAFETY) ×3 IMPLANT
COVER LIGHT HANDLE STERIS (MISCELLANEOUS) ×6 IMPLANT
DRAPE HALF SHEET 40X57 (DRAPES) ×3 IMPLANT
DRAPE PROXIMA HALF (DRAPES) ×3 IMPLANT
ELECT REM PT RETURN 9FT ADLT (ELECTROSURGICAL) ×3
ELECTRODE REM PT RTRN 9FT ADLT (ELECTROSURGICAL) ×1 IMPLANT
GAUZE SPONGE 4X4 12PLY STRL (GAUZE/BANDAGES/DRESSINGS) ×3 IMPLANT
GLOVE BIO SURGEON STRL SZ 6.5 (GLOVE) ×4 IMPLANT
GLOVE BIO SURGEONS STRL SZ 6.5 (GLOVE) ×2
GLOVE BIOGEL PI IND STRL 6.5 (GLOVE) ×1 IMPLANT
GLOVE BIOGEL PI IND STRL 7.0 (GLOVE) ×2 IMPLANT
GLOVE BIOGEL PI INDICATOR 6.5 (GLOVE) ×2
GLOVE BIOGEL PI INDICATOR 7.0 (GLOVE) ×4
GLOVE ECLIPSE 6.5 STRL STRAW (GLOVE) ×3 IMPLANT
GOWN STRL REUS W/TWL LRG LVL3 (GOWN DISPOSABLE) ×6 IMPLANT
HEMOSTAT SURGICEL 4X8 (HEMOSTASIS) ×3 IMPLANT
KIT TURNOVER CYSTO (KITS) ×3 IMPLANT
LIGASURE IMPACT 36 18CM CVD LR (INSTRUMENTS) ×3 IMPLANT
MANIFOLD NEPTUNE II (INSTRUMENTS) ×3 IMPLANT
NEEDLE HYPO 18GX1.5 BLUNT FILL (NEEDLE) ×3 IMPLANT
NS IRRIG 1000ML POUR BTL (IV SOLUTION) ×3 IMPLANT
PACK PERI GYN (CUSTOM PROCEDURE TRAY) ×3 IMPLANT
PAD ARMBOARD 7.5X6 YLW CONV (MISCELLANEOUS) ×3 IMPLANT
SET BASIN LINEN APH (SET/KITS/TRAYS/PACK) ×3 IMPLANT
SPONGE SURGIFOAM ABS GEL 100 (HEMOSTASIS) ×3 IMPLANT
SURGILUBE 3G PEEL PACK STRL (MISCELLANEOUS) ×3 IMPLANT
SUT SILK 0 FSL (SUTURE) ×3 IMPLANT
SUT VIC AB 2-0 CT2 27 (SUTURE) IMPLANT
SYR 20CC LL (SYRINGE) ×3 IMPLANT
SYR BULB IRRIGATION 50ML (SYRINGE) ×3 IMPLANT

## 2018-03-30 NOTE — Anesthesia Preprocedure Evaluation (Signed)
Anesthesia Evaluation  Patient identified by MRN, date of birth, ID band Patient awake    Reviewed: Allergy & Precautions, H&P , NPO status , Patient's Chart, lab work & pertinent test results, reviewed documented beta blocker date and time   History of Anesthesia Complications (+) PROLONGED EMERGENCE and history of anesthetic complications  Airway Mallampati: II  TM Distance: >3 FB Neck ROM: full    Dental no notable dental hx. (+) Upper Dentures, Partial Lower, Dental Advidsory Given   Pulmonary neg pulmonary ROS,    Pulmonary exam normal breath sounds clear to auscultation       Cardiovascular Exercise Tolerance: Good hypertension, + CAD  negative cardio ROS  Atrial Fibrillation + Valvular Problems/Murmurs  Rhythm:regular Rate:Normal     Neuro/Psych  Headaches, Anxiety Depression negative neurological ROS  negative psych ROS   GI/Hepatic negative GI ROS, Neg liver ROS, PUD, GERD  ,  Endo/Other  negative endocrine ROS  Renal/GU negative Renal ROS  negative genitourinary   Musculoskeletal   Abdominal   Peds  Hematology negative hematology ROS (+)   Anesthesia Other Findings 2-plus yr h/o Afib on coumadin (d/c'd 6 days), seen in SR, RRR this AM Reports h/o difficulty and pain with previous attempted SAB- refuses SAB for procedure today even after discussion. Reports h/o "too much anesthesia" with earlier GAETT.  Plan for etomidate/cardiac style induction   Reproductive/Obstetrics negative OB ROS                             Anesthesia Physical Anesthesia Plan  ASA: IV  Anesthesia Plan: General   Post-op Pain Management:    Induction:   PONV Risk Score and Plan:   Airway Management Planned:   Additional Equipment:   Intra-op Plan:   Post-operative Plan:   Informed Consent: I have reviewed the patients History and Physical, chart, labs and discussed the procedure  including the risks, benefits and alternatives for the proposed anesthesia with the patient or authorized representative who has indicated his/her understanding and acceptance.   Dental Advisory Given  Plan Discussed with: CRNA and Anesthesiologist  Anesthesia Plan Comments:         Anesthesia Quick Evaluation

## 2018-03-30 NOTE — Addendum Note (Signed)
Addendum  created 03/30/18 1022 by Jonna Munro, CRNA   Charge Capture section accepted

## 2018-03-30 NOTE — Anesthesia Postprocedure Evaluation (Signed)
Anesthesia Post Note Late Entry for 0910  Patient: Norma Barajas [place]  Procedure(s) Performed: EXTENSIVE HEMORRHOIDECTOMY (N/A )  Patient location during evaluation: PACU Anesthesia Type: General Level of consciousness: awake and alert Pain management: pain level controlled Vital Signs Assessment: post-procedure vital signs reviewed and stable Respiratory status: spontaneous breathing Anesthetic complications: no     Last Vitals:  Vitals:   03/30/18 0845 03/30/18 0900  BP: 125/68 133/75  Pulse: 71 71  Resp: 12 13  Temp:    SpO2: 100%     Last Pain:  Vitals:   03/30/18 0900  TempSrc:   PainSc: 1                  ADAMS, AMY A

## 2018-03-30 NOTE — Transfer of Care (Signed)
Immediate Anesthesia Transfer of Care Note  Patient: Norma Barajas  Procedure(s) Performed: EXTENSIVE HEMORRHOIDECTOMY (N/A )  Patient Location: PACU  Anesthesia Type:General  Level of Consciousness: awake, alert  and oriented  Airway & Oxygen Therapy: Patient Spontanous Breathing and Patient connected to nasal cannula oxygen  Post-op Assessment: Report given to RN and Post -op Vital signs reviewed and stable  Post vital signs: Reviewed and stable  Last Vitals:  Vitals Value Taken Time  BP    Temp    Pulse    Resp    SpO2      Last Pain:  Vitals:   03/30/18 0637  TempSrc: Oral  PainSc: 4       Patients Stated Pain Goal: 7 (37/79/39 [id number])  Complications: No apparent anesthesia complications

## 2018-03-30 NOTE — Op Note (Signed)
[facility] Operative Note  03/30/18  Preoperative Diagnosis:  Prolapsing hemorrhoids    Postoperative Diagnosis: Prolapsing hemorrhoids, rectocele    Procedure(s) Performed:  Extensive hemorrhoidectomy, 3 columns    Surgeon: Lanell Matar. Constance Haw, MD   Assistants: No qualified resident was available   Anesthesia: General endotracheal   Anesthesiologist: Vena Rua, MD    Specimens:  Right posterior, right anterior, left lateral columns    Estimated Blood Loss: Minimal   Blood Replacement: None    Complications: None   Wound Class: Contaminated    Operative Indications:  Norma Barajas is a 77 yo with A fib on coumadin that has a history of hemorrhoids that have caused her discomfort and bleeding for years. She has tried banding in the past which was painful and did not have longterm results.  She has contemplated the discussion we had regarding surgery including risk of bleeding, infection, risk of sphincter injury, pain associated with the procedure, and has opted to proceed. She saw her cardiologist that deemed her acceptable risk. She held her coumadin since 03/24/18.   Findings: Large 3 column hemorrhoids, rectocele present    Procedure: The patient was taken to the operating room and placed supine. General endotracheal anesthesia was induced. The peritoneum was prepared and draped in the usual sterile fashion.   A digital rectal exam was performed with slightly loose tone but adequate. There was a rectocele noted.  An anoscope was used to examine the hemorrhoidal columns. Each column was relatively large and in the clinic had prolapsed with valsalva.  A Debakey was used to elevated the right posterior hemorrhoid column from the underlying sphincter and it was retracted out.  An incision was made at the junction of the anoderm and carried down to the pedicle, and a Ligasure device was used to ligate the hemorrhoidal column, taking care to not involve or  injury the sphincter.  Hemostasis was confirmed with cautery.  A similar technique was performed on the right anterior and left lateral columns. Adequate skin Luverne Zerkle were noted between each of the excision sites.  Again hemostasis was checked, and ensured. A rolled up piece of surgicel with gel form and viscous lidocaine was placed in the anus and tagged with a 3-0 silk suture.  Xparel was injected around the anus.    Final inspection revealed acceptable hemostasis. All counts were correct at the end of the case. The patient was awakened from anesthesia and extubate without complication.  The patient went to the PACU in stable condition. The gelform/ surgicel roll will be removed in PACU prior to discharge.    Norma Labrum, MD [facility] [address] [place], [place] [address] [phone] (office)

## 2018-03-30 NOTE — Interval H&P Note (Signed)
History and Physical Interval Note:  03/30/2018 7:12 AM  Norma Barajas  has presented today for surgery, with the diagnosis of internal and external hemorrhoids  The various methods of treatment have been discussed with the patient and family. After consideration of risks, benefits and other options for treatment, the patient has consented to  Procedure(s): EXTENSIVE HEMORRHOIDECTOMY (N/A) as a surgical intervention .  The patient's history has been reviewed, patient examined, no change in status, stable for surgery.  I have reviewed the patient's chart and labs.  Questions were answered to the patient's satisfaction.    No additional questions. Has been off her blood thinner.   [name]

## 2018-03-30 NOTE — Anesthesia Procedure Notes (Signed)
Procedure Name: LMA Insertion Date/Time: 03/30/2018 7:35 AM Performed by: Jonna Munro, CRNA Pre-anesthesia Checklist: Patient identified, Emergency Drugs available, Suction available, Patient being monitored and Timeout performed Patient Re-evaluated:Patient Re-evaluated prior to induction Oxygen Delivery Method: Circle system utilized Preoxygenation: Pre-oxygenation with 100% oxygen Induction Type: IV induction Ventilation: Mask ventilation without difficulty LMA: LMA inserted LMA Size: 3.0 Number of attempts: 1 Placement Confirmation: positive ETCO2 and breath sounds checked- equal and bilateral Tube secured with: Tape Dental Injury: Teeth and Oropharynx as per pre-operative assessment

## 2018-03-30 NOTE — Discharge Instructions (Signed)
Please take your roxicodone as prescribed and alternate with tylenol every 4-6 hours.   Do not take any aspirin or NSAIDs, ibuprofen until 04/04/2018 (Tuesday)  DO NOT START YOUR COUMADIN BACK UNTIL 04/04/2018 (Tuesday).  Please keep the area clean and dry and take Sitz baths (swallow warm water baths) for comfort as needed and after bowel movements.  Please keep your stools soft and take fiber daily (metamucil) and colace (over the counter) to help prevent constipation.  If you have not had a BM in 1 days, please take miralax and if no BM in 2 days, please notify Dr. Constance Haw [facility] After Hours Number - [phone], ask for General Surgery On Call for [facility]). Expect some bleeding following the hemorrhoid surgery and significant pain.  Go to the ED with extensive bleeding, fevers, or chills.     How to Take a Sitz Bath A sitz bath is a warm water bath that is taken while you are sitting down. The water should only come up to your hips and should cover your buttocks. Your health care provider may recommend a sitz bath to help you:  Clean the lower part of your body, including your genital area.  With itching.  With pain.  With sore muscles or muscles that tighten or spasm.  How to take a sitz bath Take 3-4 sitz baths per day or as told by your health care provider. 1. Partially fill a bathtub with warm water. You will only need the water to be deep enough to cover your hips and buttocks when you are sitting in it. 2. If your health care provider told you to put medicine in the water, follow the directions exactly. 3. Sit in the water and open the tub drain a little. 4. Turn on the warm water again to keep the tub at the correct level. Keep the water running constantly. 5. Soak in the water for 15-20 minutes or as told by your health care provider. 6. After the sitz bath, pat the affected area dry first. Do not rub it. 7. Be careful when you stand up after the sitz bath because you may  feel dizzy.  Contact a health care provider if:  Your symptoms get worse. Do not continue with sitz baths if your symptoms get worse.  You have new symptoms. Do not continue with sitz baths until you talk with your health care provider. This information is not intended to replace advice given to you by your health care provider. Make sure you discuss any questions you have with your health care provider. Document Released: 07/17/2004 Document Revised: 03/24/2016 Document Reviewed: 10/23/2014 Elsevier Interactive Patient Education  [employer].

## 2018-03-31 ENCOUNTER — Encounter (HOSPITAL_COMMUNITY): Payer: Self-pay | Admitting: General Surgery

## 2018-04-12 ENCOUNTER — Ambulatory Visit: Payer: Medicare HMO | Admitting: Adult Health

## 2018-04-13 ENCOUNTER — Ambulatory Visit (INDEPENDENT_AMBULATORY_CARE_PROVIDER_SITE_OTHER): Payer: Self-pay | Admitting: General Surgery

## 2018-04-13 ENCOUNTER — Encounter: Payer: Self-pay | Admitting: General Surgery

## 2018-04-13 VITALS — BP 150/87 | HR 66 | Temp 98.9°F | Resp 16 | Wt 134.0 lb

## 2018-04-13 DIAGNOSIS — K643 Fourth degree hemorrhoids: Secondary | ICD-10-CM

## 2018-04-13 MED ORDER — TRAMADOL HCL 50 MG PO TABS: 50.0000 mg | ORAL_TABLET | Freq: Four times a day (QID) | ORAL | 0 refills | Status: DC | PRN

## 2018-04-13 NOTE — Patient Instructions (Addendum)
Take the Colace twice a day.  Start taking some Benefiber/ Metamucil over the counter to help make your stools softer.  Drink 64 ounces of water a day to help make your stools softer.  Take Miralax daily if stools are still hard.   You want to have a soft stool at least once a day.   I checked with [facility] in [place]. They have Disposable Sitz Baths, $16.99 on align #9.   Disposable Sitz Bath A disposable sitz bath is a plastic basin that fits over the toilet. A bag is hung above the toilet, and the bag is connected to a tube that opens into the basin. The bag is filled with warm water that flows into the basin through the tube. A sitz bath can be used to help relieve symptoms, clean, and promote healing in the genital and anal areas, as well as in the lower abdomen and buttocks. What are the risks? Sitz baths are generally very safe. It is possible for the skin between the genitals and the anus (perineum) to become infected, but this is rare. You can avoid this by cleaning your sitz bath supplies thoroughly. How to use a disposable sitz bath 1. Close the clamp on the tube. Make sure the clamp is closed tightly to prevent leakage. 2. Fill the sitz bath basin and the plastic bag with warm water. The water should be warm enough to be comfortable, but not hot. 3. Raise the toilet seat and place the filled basin on the toilet. Make sure the overflow opening is facing toward the back of the toilet. ? If you prefer, you may place the empty basin on the toilet first, and then use the plastic bag to fill the basin with warm water. 4. Hang the filled plastic bag overhead on a hook or towel rack close to the toilet. The bag should be higher than the toilet so that the water will flow down through the tube. 5. Attach the tube to the opening on the basin. Make sure that the tube is attached to the basin tightly to prevent leakage. 6. Sit on the basin and release the clamp. This will allow warm water to flow into  the basin and flush the area around your genitals and anus. 7. Remain sitting on the basin for about 15-20 minutes, or as long as told by your health care provider. 8. Stand up and gently pat your skin dry. If directed, apply clean bandages (dressings) to the affected area as told by your health care provider. 9. Carefully remove the basin from the toilet seat and tip the basin into the toilet to empty any remaining water. Empty any remaining water from the plastic bag into the toilet. Then, flush the toilet. 10. Wash the basin with warm water and soap. Let the basin air dry in the sink. You should also let the plastic bag and the tubing air dry. 11. Store the basin, tubing, and plastic bag in a clean, dry area. 12. Wash your hands with soap and water. If soap and water are not available, use hand sanitizer. Contact a health care provider if:  You have symptoms that get worse instead of better.  You develop new skin irritation, redness, or swelling around your genitals or anus. This information is not intended to replace advice given to you by your health care provider. Make sure you discuss any questions you have with your health care provider. Document Released: 04/25/2012 Document Revised: 04/01/2016 Document Reviewed: 09/14/2015 Elsevier Interactive  Patient Education  [employer].

## 2018-04-13 NOTE — Progress Notes (Signed)
[facility] Note   HPI:  77 y.o. Female presents to clinic for post-op follow-up after a hemorrhoidectomy. She is very upset and not happy with the surgery. She says she is hurting and has pain with Bms. Discussed extensively prior to surgery what to expect and discussed again today. Her husband is with her and agrees that all warnings were given regarding how painful and difficult the surgery was for a patient.  Patient reports minimal bleeding. She says she has some hard/ soft stools daily. The start is hard and it gets softer. She is taking colace and miralax q2 days. She says that the Roxicodone made her nauseated despite the Zofran and she is not taking it. She is back on her coumadin. She has not done any stiz baths due to limited mobility.   Review of Systems:  Pain in the anal region Some minimal bleeding  All other review of systems: otherwise negative   Vital Signs:  BP (!) 150/87 (BP Location: Left Arm, Patient Position: Sitting, Cuff Size: Normal)   Pulse 66   Temp 98.9 F (37.2 C) (Temporal)   Resp 16   Wt 134 lb (60.8 kg)   BMI 25.32 kg/m    Physical Exam:  Physical Exam  Constitutional: She appears well-developed.  HENT:  Head: Normocephalic.  Cardiovascular: Normal rate.  Pulmonary/Chest: Effort normal.  Genitourinary:  Genitourinary Comments: Anus- Swelling and some induration, no signs of infection, tender, no blood  Skin: Skin is warm and dry.  Psychiatric: Her behavior is normal. Thought content normal.  Vitals reviewed.   Laboratory studies: None   Imaging:  None    Assessment:  77 y.o. yo Female s/p hemorrhoidectomy. She never called into clinic or tried to reach out to [place], but she is very upset today. I tried to calm her the best I could.  Her husband expressed understanding and felt that all warnings had been given to the patient.    Plan:  - Tramadol for pain ordered   - Sitz bath, disposable bath at [facility] for $16.99 on align 9 in  [place], I Confirmed it was there  - Metamucil/ benefiber daily with the colace to help make stools softer/ bulkier; can do miralax daily if needed but will make her very loose more than likely   Future Appointments  Date Time Provider [place]  04/27/2018  1:00 PM [name], MD [facility] None  05/05/2018 10:00 AM Memory Argue, PharmD [facility] None   All of the above recommendations were discussed with the patient and patient's family, and all of patient's and family's questions were answered to their expressed satisfaction.  Curlene Labrum, MD [facility] [address] [place], [place] [address] [phone] (office)

## 2018-04-27 ENCOUNTER — Ambulatory Visit (INDEPENDENT_AMBULATORY_CARE_PROVIDER_SITE_OTHER): Payer: Self-pay | Admitting: General Surgery

## 2018-04-27 ENCOUNTER — Encounter: Payer: Self-pay | Admitting: General Surgery

## 2018-04-27 VITALS — BP 152/86 | HR 86 | Temp 98.6°F | Resp 16 | Wt 134.0 lb

## 2018-04-27 DIAGNOSIS — K643 Fourth degree hemorrhoids: Secondary | ICD-10-CM

## 2018-04-27 NOTE — Progress Notes (Signed)
[facility] Note   HPI:  77 y.o. Female presents to clinic for follow-up evaluation after hemorrhoidectomy. She is doing much better compared to her last visit. Patient reports decreased pain, and regular Bms. The sitz baths worked well. She did not like the tramadol we prescribed.  Review of Systems:  Anal Pain improving Regular BMs Some abdominal pain after breakfast  No bleeding All other review of systems: otherwise negative   Vital Signs:  BP (!) 152/86 (BP Location: Left Arm, Patient Position: Sitting, Cuff Size: Normal)   Pulse 86   Temp 98.6 F (37 C) (Temporal)   Resp 16   Wt 134 lb (60.8 kg)   BMI 25.32 kg/m    Physical Exam:  Physical Exam  Constitutional: She appears well-developed.  HENT:  Head: Normocephalic.  Eyes: Pupils are equal, round, and reactive to light.  Pulmonary/Chest: Effort normal.  Genitourinary:  Genitourinary Comments: Minimal external tissue, less swelling, improved induration  Vitals reviewed.   Laboratory studies: None   Imaging:  None    Assessment:  77 y.o. yo Female s/p hemorrhoidectomy. Doing well 4 weeks out.  Plan:  - No further bleeding and pain improved  - Abdominal pain, unknown etiology, potentially diet/ gas, she has tried pepto and gas x with some relief, if continues would get PCP to further evaluate  - Follow up PRN   All of the above recommendations were discussed with the patient and patient's family, and all of patient's and family's questions were answered to their expressed satisfaction.  Curlene Labrum, MD [facility] [address] [place], [place] [id number] [phone] (office)

## 2018-05-05 ENCOUNTER — Ambulatory Visit (INDEPENDENT_AMBULATORY_CARE_PROVIDER_SITE_OTHER): Payer: Medicare HMO | Admitting: Pharmacist Clinician (PhC)/ Clinical Pharmacy Specialist

## 2018-05-05 DIAGNOSIS — I4891 Unspecified atrial fibrillation: Secondary | ICD-10-CM

## 2018-05-05 LAB — COAGUCHEK XS/INR WAIVED
INR: 2.3 — ABNORMAL HIGH (ref 0.9–1.1)
Prothrombin Time: 27.9 s

## 2018-05-05 NOTE — Patient Instructions (Signed)
Description   1/2 tablet every day except for Mondays, Wednesdays, Fridays, and Saturdays take a whole tablet.  INR was 2.3 today (goal is 2.0 to 3.0)  [place]

## 2018-05-09 ENCOUNTER — Other Ambulatory Visit: Payer: Self-pay | Admitting: Family Medicine

## 2018-05-09 DIAGNOSIS — E7849 Other hyperlipidemia: Secondary | ICD-10-CM

## 2018-05-15 ENCOUNTER — Other Ambulatory Visit: Payer: Self-pay | Admitting: Family Medicine

## 2018-05-23 ENCOUNTER — Encounter: Payer: Self-pay | Admitting: Family

## 2018-05-23 ENCOUNTER — Ambulatory Visit (INDEPENDENT_AMBULATORY_CARE_PROVIDER_SITE_OTHER): Payer: Medicare HMO | Admitting: Family

## 2018-05-23 VITALS — BP 138/80 | HR 69 | Temp 98.1°F | Ht 61.0 in | Wt 132.2 lb

## 2018-05-23 DIAGNOSIS — M7661 Achilles tendinitis, right leg: Secondary | ICD-10-CM | POA: Diagnosis not present

## 2018-05-23 DIAGNOSIS — M7662 Achilles tendinitis, left leg: Secondary | ICD-10-CM

## 2018-05-23 NOTE — Progress Notes (Signed)
   Subjective:    Patient ID: Norma Barajas, female    DOB: 24-Feb-1941, 77 y.o.   MRN: [id number]  Chief Complaint  Patient presents with  . left ankle pain  . swollen right ankle   Pt presents to the office today with bilateral ankle swelling and tenderness. She states she started walking in her neighborhood about two weeks ago and then increased it about a week ago. She reports the pain started last week and has not improved.  Ankle Pain   The incident occurred more than 1 week ago. There was no injury mechanism. The pain is present in the left ankle and right ankle. The quality of the pain is described as aching. The pain is at a severity of 8/10. The pain is mild. The pain has been intermittent (only when walking) since onset. Pertinent negatives include no muscle weakness, numbness or tingling. She reports no foreign bodies present. The symptoms are aggravated by weight bearing. She has tried rest and acetaminophen for the symptoms. The treatment provided mild relief.      Review of Systems  Neurological: Negative for tingling and numbness.  All other systems reviewed and are negative.      Objective:   Physical Exam  Constitutional: She is oriented to person, place, and time. She appears well-developed and well-nourished. No distress.  HENT:  Head: Normocephalic and atraumatic.  Eyes: Pupils are equal, round, and reactive to light.  Neck: Normal range of motion. Neck supple. No thyromegaly present.  Cardiovascular: Normal rate, regular rhythm, normal heart sounds and intact distal pulses.  No murmur heard. Pulmonary/Chest: Effort normal and breath sounds normal. No respiratory distress. She has no wheezes.  Abdominal: Soft. Bowel sounds are normal. She exhibits no distension. There is no tenderness.  Musculoskeletal: Normal range of motion. She exhibits edema (trace swelling of lateral ankle). She exhibits no tenderness.  Full ROM of bilateral ankles, pain in heel and lateral  ankle with pressure  Neurological: She is alert and oriented to person, place, and time. She has normal reflexes. No cranial nerve deficit.  Skin: Skin is warm and dry.  Psychiatric: She has a normal mood and affect. Her behavior is normal. Judgment and thought content normal.  Vitals reviewed.     BP 138/80   Pulse 69   Temp 98.1 F (36.7 C) (Oral)   Ht 5\' 1"  (1.549 m)   Wt 132 lb 3.2 oz (60 kg)   BMI 24.98 kg/m      Assessment & Plan:  [name] was seen today for left ankle pain and swollen right ankle.  Diagnoses and all orders for this visit:  Achilles tendinitis of both lower extremities  Rest Ice  ROM exercises and stretches encouraged No exercising for next week then gradually increasing activity  No NSAID's with warfarin   Evelina Dun, FNP

## 2018-05-23 NOTE — Patient Instructions (Signed)
Achilles Tendinitis  Achilles tendinitis is inflammation of the tough, cord-like band that attaches the lower leg muscles to the heel bone (Achilles tendon). This is usually caused by overusing the tendon and the ankle joint.  Achilles tendinitis usually gets better over time with treatment and caring for yourself at home. It can take weeks or months to heal completely.  What are the causes?  This condition may be caused by:  · A sudden increase in exercise or activity, such as running.  · Doing the same exercises or activities (such as jumping) over and over.  · Not warming up calf muscles before exercising.  · Exercising in shoes that are worn out or not made for exercise.  · Having arthritis or a bone growth (spur) on the back of the heel bone. This can rub against the tendon and hurt it.  · Age-related wear and tear. Tendons become less flexible with age and more likely to be injured.    What are the signs or symptoms?  Common symptoms of this condition include:  · Pain in the Achilles tendon or in the back of the leg, just above the heel. The pain usually gets worse with exercise.  · Stiffness or soreness in the back of the leg, especially in the morning.  · Swelling of the skin over the Achilles tendon.  · Thickening of the tendon.  · Bone spurs at the bottom of the Achilles tendon, near the heel.  · Trouble standing on tiptoe.    How is this diagnosed?  This condition is diagnosed based on your symptoms and a physical exam. You may have tests, including:  · X-rays.  · MRI.    How is this treated?  The goal of treatment is to relieve symptoms and help your injury heal. Treatment may include:  · Decreasing or stopping activities that caused the tendinitis. This may mean switching to low-impact exercises like biking or swimming.  · Icing the injured area.  · Doing physical therapy, including strengthening and stretching exercises.  · NSAIDs to help relieve pain and swelling.  · Using supportive shoes, wraps,  heel lifts, or a walking boot (air cast).  · Surgery. This may be done if your symptoms do not improve after 6 months.  · Using high-energy shock wave impulses to stimulate the healing process (extracorporeal shock wave therapy). This is rare.  · Injection of medicines to help relieve inflammation (corticosteroids). This is rare.    Follow these instructions at home:  If you have an air cast:   · Wear the cast as told by your health care provider. Remove it only as told by your health care provider.  · Loosen the cast if your toes tingle, become numb, or turn cold and blue.  Activity   · Gradually return to your normal activities once your health care provider approves. Do not do activities that cause pain.  ? Consider doing low-impact exercises, like cycling or swimming.  · If you have an air cast, ask your health care provider when it is safe for you to drive.  · If physical therapy was prescribed, do exercises as told by your health care provider or physical therapist.  Managing pain, stiffness, and swelling   · Raise (elevate) your foot above the level of your heart while you are sitting or lying down.  · Move your toes often to avoid stiffness and to lessen swelling.  · If directed, put ice on the injured area:  ?   Put ice in a plastic bag.  ? Place a towel between your skin and the bag.  ? Leave the ice on for 20 minutes, 2-3 times a day  General instructions   · If directed, wrap your foot with an elastic bandage or other wrap. This can help keep your tendon from moving too much while it heals. Your health care provider will show you how to wrap your foot correctly.  · Wear supportive shoes or heel lifts only as told by your health care provider.  · Take over-the-counter and prescription medicines only as told by your health care provider.  · Keep all follow-up visits as told by your health care provider. This is important.  Contact a health care provider if:  · You have symptoms that gets worse.  · You have  pain that does not get better with medicine.  · You develop new, unexplained symptoms.  · You develop warmth and swelling in your foot.  · You have a fever.  Get help right away if:  · You have a sudden popping sound or sensation in your Achilles tendon followed by severe pain.  · You cannot move your toes or foot.  · You cannot put any weight on your foot.  Summary  · Achilles tendinitis is inflammation of the tough, cord-like band that attaches the lower leg muscles to the heel bone (Achilles tendon).  · This condition is usually caused by overusing the tendon and the ankle joint. It can also be caused by arthritis or normal aging.  · The most common symptoms of this condition include pain, swelling, or stiffness in the Achilles tendon or in the back of the leg.  · This condition is usually treated with rest, NSAIDs, and physical therapy.  This information is not intended to replace advice given to you by your health care provider. Make sure you discuss any questions you have with your health care provider.  Document Released: 08/04/2005 Document Revised: 09/13/2016 Document Reviewed: 09/13/2016  Elsevier Interactive Patient Education © 2017 [employer]vier Inc[employer]

## 2018-05-25 DIAGNOSIS — H5210 Myopia, unspecified eye: Secondary | ICD-10-CM | POA: Diagnosis not present

## 2018-05-25 DIAGNOSIS — Z0100 Encounter for examination of eyes and vision without abnormal findings: Secondary | ICD-10-CM | POA: Diagnosis not present

## 2018-06-12 ENCOUNTER — Other Ambulatory Visit: Payer: Self-pay | Admitting: Family Medicine

## 2018-06-16 ENCOUNTER — Ambulatory Visit (INDEPENDENT_AMBULATORY_CARE_PROVIDER_SITE_OTHER): Payer: Medicare HMO | Admitting: Pharmacist Clinician (PhC)/ Clinical Pharmacy Specialist

## 2018-06-16 DIAGNOSIS — I4891 Unspecified atrial fibrillation: Secondary | ICD-10-CM | POA: Diagnosis not present

## 2018-06-20 LAB — COAGUCHEK XS/INR WAIVED
INR: 2.7 — ABNORMAL HIGH (ref 0.9–1.1)
Prothrombin Time: 33 s

## 2018-06-21 ENCOUNTER — Other Ambulatory Visit: Payer: Self-pay | Admitting: Family Medicine

## 2018-07-09 DIAGNOSIS — T45515A Adverse effect of anticoagulants, initial encounter: Secondary | ICD-10-CM | POA: Diagnosis not present

## 2018-07-09 DIAGNOSIS — M25551 Pain in right hip: Secondary | ICD-10-CM | POA: Diagnosis not present

## 2018-07-09 DIAGNOSIS — D689 Coagulation defect, unspecified: Secondary | ICD-10-CM | POA: Diagnosis not present

## 2018-07-09 DIAGNOSIS — Z79899 Other long term (current) drug therapy: Secondary | ICD-10-CM | POA: Diagnosis not present

## 2018-07-09 DIAGNOSIS — S8011XA Contusion of right lower leg, initial encounter: Secondary | ICD-10-CM | POA: Diagnosis not present

## 2018-07-09 DIAGNOSIS — R1030 Lower abdominal pain, unspecified: Secondary | ICD-10-CM | POA: Diagnosis not present

## 2018-07-09 DIAGNOSIS — X58XXXA Exposure to other specified factors, initial encounter: Secondary | ICD-10-CM | POA: Diagnosis not present

## 2018-07-09 DIAGNOSIS — S7011XA Contusion of right thigh, initial encounter: Secondary | ICD-10-CM | POA: Diagnosis not present

## 2018-07-09 DIAGNOSIS — D6832 Hemorrhagic disorder due to extrinsic circulating anticoagulants: Secondary | ICD-10-CM | POA: Diagnosis not present

## 2018-07-11 ENCOUNTER — Encounter: Payer: Self-pay | Admitting: Cardiovascular Disease

## 2018-07-11 ENCOUNTER — Ambulatory Visit (INDEPENDENT_AMBULATORY_CARE_PROVIDER_SITE_OTHER): Payer: Medicare HMO | Admitting: Nurse Practitioner

## 2018-07-11 ENCOUNTER — Encounter: Payer: Self-pay | Admitting: Nurse Practitioner

## 2018-07-11 VITALS — BP 122/72 | HR 81 | Temp 98.5°F | Ht 61.0 in | Wt 135.0 lb

## 2018-07-11 DIAGNOSIS — S7011XA Contusion of right thigh, initial encounter: Secondary | ICD-10-CM | POA: Diagnosis not present

## 2018-07-11 DIAGNOSIS — I4891 Unspecified atrial fibrillation: Secondary | ICD-10-CM | POA: Diagnosis not present

## 2018-07-11 DIAGNOSIS — S7011XD Contusion of right thigh, subsequent encounter: Secondary | ICD-10-CM

## 2018-07-11 LAB — COAGUCHEK XS/INR WAIVED
INR: 2.2 — AB (ref 0.9–1.1)
PROTHROMBIN TIME: 26.5 s

## 2018-07-11 NOTE — Progress Notes (Signed)
   Subjective:    Patient ID: Norma Barajas, female    DOB: March 04, 1941, 77 y.o.   MRN: [id number]   Chief Complaint: Hospitalization Follow-up   HPI Patient went to ER on Sunday with a bruise on right thigh. Denied any injury, she says it just appeared. Patient is on warfarin and her INR at ER was 3.8. They told her to hold coumadin and follow up with PCP today. She is having a lot of pain around area of bruising. Pain to touch and when walking.    Review of Systems  Constitutional: Negative.   Respiratory: Negative.   Cardiovascular: Negative.   Genitourinary: Negative.   Neurological: Negative.   Psychiatric/Behavioral: Negative.   All other systems reviewed and are negative.      Objective:   Physical Exam  Constitutional: She is oriented to person, place, and time. She appears well-developed and well-nourished. No distress.  Cardiovascular: Normal rate and regular rhythm.  Pulmonary/Chest: Effort normal.  Neurological: She is alert and oriented to person, place, and time.  Skin: Skin is warm.  Black ecchymosis on right inner thiigh from groin down to knee with new area starting behind knee. Area is slightly tender to touch but soft.  Psychiatric: She has a normal mood and affect. Her behavior is normal. Thought content normal.   BP 122/72   Pulse 81   Temp 98.5 F (36.9 C) (Oral)   Ht 5\' 1"  (1.549 m)   Wt 135 lb (61.2 kg)   BMI 25.51 kg/m       Assessment & Plan:  Norma Barajas in today with chief complaint of Hospitalization Follow-up   1. Atrial fibrillation, unspecified type ([place]) Back on coumadin 5Mg  daily except 1.5 mg on Sun Tues and Thurs  - CoaguChek XS/INR Waived  2. Contusion of right thigh, subsequent encounter Ice are Bruise with take several weeks to resolve. Can ice if helps  * er records reviewed [place], FNP

## 2018-07-13 ENCOUNTER — Other Ambulatory Visit: Payer: Self-pay | Admitting: Family Medicine

## 2018-07-13 DIAGNOSIS — E7849 Other hyperlipidemia: Secondary | ICD-10-CM

## 2018-07-19 ENCOUNTER — Ambulatory Visit (INDEPENDENT_AMBULATORY_CARE_PROVIDER_SITE_OTHER): Payer: Medicare HMO | Admitting: Pharmacist Clinician (PhC)/ Clinical Pharmacy Specialist

## 2018-07-19 ENCOUNTER — Telehealth: Payer: Self-pay | Admitting: Family Medicine

## 2018-07-19 DIAGNOSIS — I4891 Unspecified atrial fibrillation: Secondary | ICD-10-CM

## 2018-07-19 LAB — COAGUCHEK XS/INR WAIVED
INR: 2.3 — AB (ref 0.9–1.1)
PROTHROMBIN TIME: 27.4 s

## 2018-07-19 MED ORDER — TRAMADOL HCL 50 MG PO TABS: 50.0000 mg | ORAL_TABLET | Freq: Three times a day (TID) | ORAL | 0 refills | Status: DC | PRN

## 2018-07-19 NOTE — Telephone Encounter (Signed)
I sent the medication to [facility], make sure we canceled the one for [facility]

## 2018-07-19 NOTE — Telephone Encounter (Signed)
Patient aware.

## 2018-07-19 NOTE — Telephone Encounter (Signed)
Norma Barajas at [name] states that the pt is no longer wanting to get her      traMADol (ULTRAM) 50 MG tablet    [facility] wants to [facility] it [facility], since it is controlled we need to send a new rx to [place]

## 2018-07-19 NOTE — Patient Instructions (Signed)
Description   Continue taking warfarin the same way  INR was 2.3 today (goal is 2.0 to 3.0)  [place]

## 2018-07-19 NOTE — Telephone Encounter (Signed)
-----   Message from Memory Argue, PharmD sent at 07/19/2018 10:00 AM EDT ----- Regarding: something for pain called in [place]!  I just saw Norma Barajas and her INR has gone up from previously well controlled in the 2.0's to 3.8 and she went to the ED with groin pain and a hematoma.  She is still having severe groin pain, especially with standing.  She is taking ES tylenol with little relief and was asking for something stronger for the pain, tramadol? I did tell her that prescription pain meds could cause N/V and dizziness/drowsiness.  She has never taken pain medications that she can recall.   She still would like something for a couple of days.  They are now using [facility].  Thanks  [employer]

## 2018-07-19 NOTE — Telephone Encounter (Signed)
Please of the patient know that I sent in tramadol for her for now and if she still has issues that she needs to come back in.

## 2018-07-20 NOTE — Telephone Encounter (Signed)
Pt aware of RX Pt states she can not take Tramadol due to med causing chest pain Pt states she will continue Tylenol for pain

## 2018-08-02 ENCOUNTER — Ambulatory Visit: Payer: Medicare HMO | Admitting: Family

## 2018-08-02 ENCOUNTER — Encounter (INDEPENDENT_AMBULATORY_CARE_PROVIDER_SITE_OTHER): Payer: Self-pay

## 2018-08-02 ENCOUNTER — Encounter: Payer: Self-pay | Admitting: Cardiovascular Disease

## 2018-08-02 ENCOUNTER — Ambulatory Visit (INDEPENDENT_AMBULATORY_CARE_PROVIDER_SITE_OTHER): Payer: Medicare HMO | Admitting: Family Medicine

## 2018-08-02 ENCOUNTER — Encounter: Payer: Self-pay | Admitting: Family Medicine

## 2018-08-02 VITALS — BP 135/81 | HR 73 | Temp 98.6°F | Ht 61.0 in | Wt 132.4 lb

## 2018-08-02 DIAGNOSIS — I4891 Unspecified atrial fibrillation: Secondary | ICD-10-CM

## 2018-08-02 DIAGNOSIS — I10 Essential (primary) hypertension: Secondary | ICD-10-CM | POA: Diagnosis not present

## 2018-08-02 NOTE — Progress Notes (Signed)
Subjective:  Patient ID: Norma Barajas, female    DOB: Nov 06, 1941  Age: 77 y.o. MRN: [id number]  CC: Fatigue (pt here today c/o "having A-fib" )   HPI Norma Barajas presents for feeling tired and rundown.  She is also been checking Norma Barajas blood pressure at home and notes that the pulse is been very high.  She has not had any chest pain or palpitations.  However she is concerned that Norma Barajas atrial fibrillation is failed flaring up.  She has had no focal weakness that might be related to a TIA from a clot thrown by the A. fib.  Patient does take Coumadin and recently had a therapeutic check and continues to take the medicine as directed.  Additionally she is taking rate control through Lopressor and sotalol.  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  Depression screen [facility] 2/9 07/11/2018 05/23/2018 03/09/2018  Decreased Interest 0 0 0  Down, Depressed, Hopeless 0 0 0  PHQ - 2 Score 0 0 0  Some recent data might be hidden    History Norma Barajas has a past medical history of Anxiety, Arthritis, Cataract, Coronary atherosclerosis of native coronary artery, Depression, Diastolic dysfunction, Dysrhythmia, Erosive esophagitis, Essential hypertension, GERD (gastroesophageal reflux disease), Heart murmur, History of hiatal hernia, Hypercholesterolemia, Migraine, Osteoporosis, Paroxysmal atrial fibrillation ([place]), PONV (postoperative nausea and vomiting), and Vertigo.   She has a past surgical history that includes Total abdominal hysterectomy; Prolapsed uterine fibroid ligation (2015); Cataract extraction w/ intraocular lens  implant, bilateral (Bilateral); Open reduction internal fixation (orif) tibia/fibula fracture (Right, 2013); Esophagogastroduodenoscopy (egd) with esophageal dilation (2001); Colonoscopy (2008); Esophagogastroduodenoscopy (egd) with esophageal dilation (11/15/2012); BRAVO ph study (11/15/2012); Flexible sigmoidoscopy (N/A, 08/22/2014); Hemorrhoid banding (N/A, 08/22/2014); Eye  surgery (Bilateral); Dilation and curettage of uterus; Fracture surgery (Right); and Hemorrhoid surgery (N/A, 03/30/2018).   Norma Barajas family history includes Arthritis in Norma Barajas sister; Cancer in Norma Barajas sister; Colon cancer in Norma Barajas mother; Dementia in Norma Barajas sister; Diabetes in Norma Barajas sister and son; Heart disease in Norma Barajas brother, father, mother, and sister; Hip fracture in Norma Barajas mother; Hyperlipidemia in Norma Barajas brother; Hypertension in Norma Barajas brother; Osteoporosis in Norma Barajas mother and sister; Stroke in Norma Barajas brother, sister, sister, and son.She reports that she has never smoked. She has never used smokeless tobacco. She reports that she does not drink alcohol or use drugs.    ROS Review of Systems  Constitutional: Negative.   HENT: Negative for congestion.   Eyes: Negative for visual disturbance.  Respiratory: Negative for shortness of breath.   Cardiovascular: Negative for chest pain, palpitations and leg swelling.  Gastrointestinal: Negative for abdominal pain, constipation, diarrhea, nausea and vomiting.  Musculoskeletal: Negative for arthralgias and myalgias.  Psychiatric/Behavioral: Negative for sleep disturbance. The patient is nervous/anxious.     Objective:  BP 135/81   Pulse 73   Temp 98.6 F (37 C) (Oral)   Ht 5\' 1"  (1.549 m)   Wt 132 lb 6 oz (60 kg)   BMI 25.01 kg/m   BP Readings from Last 3 Encounters:  08/02/18 135/81  07/11/18 122/72  05/23/18 138/80    Wt Readings from Last 3 Encounters:  08/02/18 132 lb 6 oz (60 kg)  07/11/18 135 lb (61.2 kg)  05/23/18 132 lb 3.2 oz (60 kg)     Physical Exam  Constitutional: She is oriented to person, place, and time. She appears well-developed and well-nourished. No distress.  HENT:  Head: Normocephalic and atraumatic.  Right Ear: External  ear normal.  Left Ear: External ear normal.  Nose: Nose normal.  Mouth/Throat: Oropharynx is clear and moist.  Eyes: Pupils are equal, round, and reactive to light. Conjunctivae and EOM are normal.  Neck: Normal  range of motion. Neck supple. No thyromegaly present.  Cardiovascular: Normal rate and normal heart sounds.  No murmur (Not heard today although historically she does have a murmur she states.) heard. Pulmonary/Chest: Effort normal and breath sounds normal. No respiratory distress. She has no wheezes. She has no rales.  Abdominal: Soft. Bowel sounds are normal. She exhibits no distension. There is no tenderness.  Lymphadenopathy:    She has no cervical adenopathy.  Neurological: She is alert and oriented to person, place, and time. She has normal reflexes.  Skin: Skin is warm and dry.  Psychiatric: She has a normal mood and affect. Norma Barajas behavior is normal. Judgment and thought content normal.      Assessment & Plan:   Norma Barajas was seen today for fatigue.  Diagnoses and all orders for this visit:  Atrial fibrillation, unspecified type [facility])  Essential hypertension       I have discontinued Norma Barajas's docusate sodium and traMADol. I am also having Norma Barajas maintain Norma Barajas multivitamin, metoprolol tartrate, ALPRAZolam, Calcium Carbonate-Vitamin D (CALCIUM-VITAMIN D3 PO), amLODipine, warfarin, omeprazole, sotalol, and simvastatin.  Allergies as of 08/02/2018      Reactions   Codeine Nausea And Vomiting   Triamterene-hctz Other (See Comments)   weakness   Atorvastatin Other (See Comments)   Myalgias   Crestor [rosuvastatin Calcium] Other (See Comments)   weakness   Morphine Nausea Only   Risedronate Sodium Other (See Comments)   ACTONEL REACTION: reflux      Medication List        Accurate as of 08/02/18 10:14 AM. Always use your most recent med list.          ALPRAZolam 0.25 MG tablet Commonly known as:  XANAX Take 1 tablet (0.25 mg total) by mouth 2 (two) times daily.   amLODipine 10 MG tablet Commonly known as:  NORVASC TAKE 1 TABLET EVERY DAY   CALCIUM-VITAMIN D3 PO Take 1 tablet by mouth daily.   metoprolol tartrate 25 MG tablet Commonly known as:   LOPRESSOR TAKE 1/2 TABLET TWICE DAILY   multivitamin capsule Take 1 capsule by mouth every evening.   omeprazole 20 MG capsule Commonly known as:  PRILOSEC TAKE 1 CAPSULE EVERY DAY   simvastatin 40 MG tablet Commonly known as:  ZOCOR TAKE 1 TABLET EVERY EVENING   sotalol 80 MG tablet Commonly known as:  BETAPACE TAKE 1 TABLET EVERY DAY   warfarin 3 MG tablet Commonly known as:  COUMADIN Take as directed by the anticoagulation clinic. If you are unsure how to take this medication, talk to your nurse or doctor. Original instructions:  TAKE 1/2 TO 1 TABLET EVERY DAY AS DIRECTED  BY  ANTICOAGULATION  CLINIC      We talked about checking Norma Barajas pulse manually I demonstrated how she could go about doing that.  If it is under 100 she is okay.  However if she starts getting short of breath or chest pain that might be even more important than what Norma Barajas pulse is running.  Norma Barajas fatigue is likely related to the combination of atrial fibrillation and multiple rate control medicines.  Follow-up: Return if symptoms worsen or fail to improve and as previously scheduled.[name], M.D.

## 2018-08-14 ENCOUNTER — Ambulatory Visit (INDEPENDENT_AMBULATORY_CARE_PROVIDER_SITE_OTHER): Payer: Medicare HMO | Admitting: *Deleted

## 2018-08-14 DIAGNOSIS — Z23 Encounter for immunization: Secondary | ICD-10-CM

## 2018-08-16 ENCOUNTER — Other Ambulatory Visit: Payer: Self-pay | Admitting: Family Medicine

## 2018-08-16 ENCOUNTER — Ambulatory Visit (INDEPENDENT_AMBULATORY_CARE_PROVIDER_SITE_OTHER): Payer: Medicare HMO | Admitting: Pharmacist Clinician (PhC)/ Clinical Pharmacy Specialist

## 2018-08-16 ENCOUNTER — Other Ambulatory Visit: Payer: Self-pay | Admitting: Cardiovascular Disease

## 2018-08-16 DIAGNOSIS — I4891 Unspecified atrial fibrillation: Secondary | ICD-10-CM

## 2018-08-16 DIAGNOSIS — E7849 Other hyperlipidemia: Secondary | ICD-10-CM | POA: Diagnosis not present

## 2018-08-16 LAB — COAGUCHEK XS/INR WAIVED
INR: 3 — AB (ref 0.9–1.1)
PROTHROMBIN TIME: 36.2 s

## 2018-08-16 MED ORDER — SIMVASTATIN 20 MG PO TABS: 20.0000 mg | ORAL_TABLET | Freq: Every day | ORAL | 3 refills | Status: DC

## 2018-08-16 NOTE — Addendum Note (Signed)
Addended by: Antonietta Barcelona D on: 08/16/2018 02:43 PM   Modules accepted: Orders

## 2018-08-16 NOTE — Progress Notes (Signed)
Patient is having worsening dizziness and has fallen into the door frame and wall several times leaving bruises. Patient is taking both sotalol and metoprolol which could be causing these symptoms.  I have asked her (per Dr. Warrick Parisian) to decrease metoprolol from 12.5mg  bid to 12.5mg  qam for 1 week and if she is not having any rebound hypertension, tachycardia, or a fib to stop after 2 weeks completely.  I also have asked her to half her simvastatin 40mg  dose since she is also taking amlodipine.  The dose is capped at 20mg  of simvastatin when taken with amlodipine

## 2018-08-16 NOTE — Patient Instructions (Signed)
Description   Continue taking warfarin the same way  INR was 3.0 today (goal is 2.0 to 3.0)  [place]

## 2018-08-25 ENCOUNTER — Ambulatory Visit: Payer: Medicare HMO | Admitting: Cardiovascular Disease

## 2018-08-31 ENCOUNTER — Other Ambulatory Visit: Payer: Medicare HMO

## 2018-08-31 DIAGNOSIS — E782 Mixed hyperlipidemia: Secondary | ICD-10-CM

## 2018-08-31 DIAGNOSIS — I10 Essential (primary) hypertension: Secondary | ICD-10-CM

## 2018-08-31 LAB — CBC WITH DIFFERENTIAL/PLATELET
BASOS: 1 %
Basophils Absolute: 0.1 10*3/uL (ref 0.0–0.2)
EOS (ABSOLUTE): 0.1 10*3/uL (ref 0.0–0.4)
Eos: 2 %
Hematocrit: 44 % (ref 34.0–46.6)
Hemoglobin: 14.7 g/dL (ref 11.1–15.9)
Immature Grans (Abs): 0 10*3/uL (ref 0.0–0.1)
Immature Granulocytes: 0 %
LYMPHS ABS: 1.7 10*3/uL (ref 0.7–3.1)
Lymphs: 34 %
MCH: 30.7 pg (ref 26.6–33.0)
MCHC: 33.4 g/dL (ref 31.5–35.7)
MCV: 92 fL (ref 79–97)
MONOS ABS: 0.6 10*3/uL (ref 0.1–0.9)
Monocytes: 11 %
NEUTROS ABS: 2.6 10*3/uL (ref 1.4–7.0)
Neutrophils: 52 %
Platelets: 218 10*3/uL (ref 150–450)
RBC: 4.79 x10E6/uL (ref 3.77–5.28)
RDW: 13.7 % (ref 12.3–15.4)
WBC: 5 10*3/uL (ref 3.4–10.8)

## 2018-08-31 LAB — CMP14+EGFR
ALT: 13 IU/L (ref 0–32)
AST: 20 IU/L (ref 0–40)
Albumin/Globulin Ratio: 1.7 (ref 1.2–2.2)
Albumin: 4.5 g/dL (ref 3.5–4.8)
Alkaline Phosphatase: 80 IU/L (ref 39–117)
BUN/Creatinine Ratio: 12 (ref 12–28)
BUN: 9 mg/dL (ref 8–27)
Bilirubin Total: 0.3 mg/dL (ref 0.0–1.2)
CALCIUM: 9.3 mg/dL (ref 8.7–10.3)
CO2: 28 mmol/L (ref 20–29)
CREATININE: 0.78 mg/dL (ref 0.57–1.00)
Chloride: 101 mmol/L (ref 96–106)
GFR calc Af Amer: 84 mL/min/{1.73_m2} (ref >59)
GFR, EST NON AFRICAN AMERICAN: 73 mL/min/{1.73_m2} (ref >59)
GLOBULIN, TOTAL: 2.7 g/dL (ref 1.5–4.5)
Glucose: 90 mg/dL (ref 65–99)
Potassium: 3.7 mmol/L (ref 3.5–5.2)
SODIUM: 145 mmol/L — AB (ref 134–144)
Total Protein: 7.2 g/dL (ref 6.0–8.5)

## 2018-08-31 LAB — LIPID PANEL
CHOL/HDL RATIO: 5.3 ratio — AB (ref 0.0–4.4)
Cholesterol, Total: 201 mg/dL — ABNORMAL HIGH (ref 100–199)
HDL: 38 mg/dL — ABNORMAL LOW (ref >39)
LDL CALC: 107 mg/dL — AB (ref 0–99)
TRIGLYCERIDES: 281 mg/dL — AB (ref 0–149)
VLDL Cholesterol Cal: 56 mg/dL — ABNORMAL HIGH (ref 5–40)

## 2018-09-07 ENCOUNTER — Encounter: Payer: Self-pay | Admitting: Family Medicine

## 2018-09-07 ENCOUNTER — Ambulatory Visit (INDEPENDENT_AMBULATORY_CARE_PROVIDER_SITE_OTHER): Payer: Medicare HMO | Admitting: Family Medicine

## 2018-09-07 VITALS — BP 142/99 | HR 123 | Temp 97.4°F | Ht 61.0 in | Wt 134.6 lb

## 2018-09-07 DIAGNOSIS — E782 Mixed hyperlipidemia: Secondary | ICD-10-CM

## 2018-09-07 DIAGNOSIS — R000 Tachycardia, unspecified: Secondary | ICD-10-CM

## 2018-09-07 DIAGNOSIS — R5383 Other fatigue: Secondary | ICD-10-CM | POA: Diagnosis not present

## 2018-09-07 DIAGNOSIS — F411 Generalized anxiety disorder: Secondary | ICD-10-CM | POA: Diagnosis not present

## 2018-09-07 DIAGNOSIS — R7303 Prediabetes: Secondary | ICD-10-CM | POA: Diagnosis not present

## 2018-09-07 DIAGNOSIS — I10 Essential (primary) hypertension: Secondary | ICD-10-CM

## 2018-09-07 MED ORDER — ALPRAZOLAM 0.25 MG PO TABS: 0.2500 mg | ORAL_TABLET | Freq: Two times a day (BID) | ORAL | 5 refills | Status: DC | PRN

## 2018-09-07 MED ORDER — PRAVASTATIN SODIUM 40 MG PO TABS: 40.0000 mg | ORAL_TABLET | Freq: Every day | ORAL | 0 refills | Status: DC

## 2018-09-07 NOTE — Progress Notes (Signed)
BP (!) 142/99   Pulse (!) 123   Temp (!) 97.4 F (36.3 C) (Oral)   Ht 5' 1"  (1.549 m)   Wt 134 lb 9.6 oz (61.1 kg)   BMI 25.43 kg/m    Subjective:    Patient ID: [name], female    DOB: [birth year], 77 y.o.   MRN: [id number]  HPI: RESHANDA LEWEY is a 77 y.o. female presenting on 09/07/2018 for Hypertension (6 month follow up); Hyperlipidemia; Fatigue (Patient states that it has been going on since she was taken off of metoprolol. x 3 weeks); and off balance   HPI Prediabetes Patient comes in today for recheck of his diabetes. Patient has been currently taking medication right now we are just monitoring. Patient is not currently on an ACE inhibitor/ARB. Patient has not seen an ophthalmologist this year. Patient denies any issues with their feet.   Hyperlipidemia Patient is coming in for recheck of his hyperlipidemia. The patient is currently taking a statin. They deny any issues with myalgias or history of liver damage from it. They deny any focal numbness or weakness or chest pain.   Hypertension Patient is currently on metoprolol and amlodipine and sotalol but patient had stopped taking metoprolol based on the recommendation and her heart rate today is 123 and she is feeling palpitations and flutters and feeling more fatigued from it, and their blood pressure today is 142/99. Patient denies any lightheadedness or dizziness. Patient denies headaches, blurred vision, chest pains, shortness of breath, or weakness. Denies any side effects from medication and is content with current medication.  Fatigue and palpitations Patient has been have a lot of fatigue and palpitations since stopping her half tablet of metoprolol 25 twice daily because she was feeling more fatigue and she has felt worse since stopping it and she feels the palpitations and flutters like her heart rate is racing.  Relevant past medical, surgical, family and social history reviewed and updated as indicated. Interim  medical history since our last visit reviewed. Allergies and medications reviewed and updated.  Review of Systems  Constitutional: Positive for fatigue. Negative for chills and fever.  Eyes: Negative for visual disturbance.  Respiratory: Negative for chest tightness and shortness of breath.   Cardiovascular: Positive for palpitations. Negative for chest pain and leg swelling.  Musculoskeletal: Negative for back pain and gait problem.  Skin: Negative for rash.  Neurological: Negative for dizziness, light-headedness and headaches.  Psychiatric/Behavioral: Negative for agitation and behavioral problems.  All other systems reviewed and are negative.   Per HPI unless specifically indicated above   Allergies as of 09/07/2018      Reactions   Codeine Nausea And Vomiting   Triamterene-hctz Other (See Comments)   weakness   Atorvastatin Other (See Comments)   Myalgias   Crestor [rosuvastatin Calcium] Other (See Comments)   weakness   Morphine Nausea Only   Risedronate Sodium Other (See Comments)   ACTONEL REACTION: reflux      Medication List        Accurate as of 09/07/18 10:32 AM. Always use your most recent med list.          ALPRAZolam 0.25 MG tablet Commonly known as:  XANAX Take 1 tablet (0.25 mg total) by mouth 2 (two) times daily.   amLODipine 10 MG tablet Commonly known as:  NORVASC TAKE 1 TABLET EVERY DAY   CALCIUM-VITAMIN D3 PO Take 1 tablet by mouth daily.   metoprolol tartrate 25 MG tablet Commonly known  as:  LOPRESSOR TAKE 1/2 TABLET TWICE DAILY   multivitamin capsule Take 1 capsule by mouth every evening.   omeprazole 20 MG capsule Commonly known as:  PRILOSEC TAKE 1 CAPSULE EVERY DAY   simvastatin 20 MG tablet Commonly known as:  ZOCOR Take 1 tablet (20 mg total) by mouth at bedtime.   sotalol 80 MG tablet Commonly known as:  BETAPACE TAKE 1 TABLET EVERY DAY   warfarin 3 MG tablet Commonly known as:  COUMADIN Take as directed by the  anticoagulation clinic. If you are unsure how to take this medication, talk to your nurse or doctor. Original instructions:  TAKE 1/2 TO 1 TABLET EVERY DAY AS DIRECTED  BY  ANTICOAGULATION  CLINIC          Objective:    BP (!) 142/99   Pulse (!) 123   Temp (!) 97.4 F (36.3 C) (Oral)   Ht 5' 1"  (1.549 m)   Wt 134 lb 9.6 oz (61.1 kg)   BMI 25.43 kg/m   Wt Readings from Last 3 Encounters:  09/07/18 134 lb 9.6 oz (61.1 kg)  08/02/18 132 lb 6 oz (60 kg)  07/11/18 135 lb (61.2 kg)    Physical Exam  Constitutional: She is oriented to person, place, and time. She appears well-developed and well-nourished. No distress.  Eyes: Conjunctivae are normal.  Cardiovascular: Normal heart sounds and intact distal pulses. An irregular rhythm present. Tachycardia present.  No murmur heard. Pulmonary/Chest: Effort normal and breath sounds normal. No respiratory distress. She has no wheezes.  Musculoskeletal: Normal range of motion. She exhibits no edema.  Neurological: She is alert and oriented to person, place, and time. Coordination normal.  Skin: Skin is warm and dry. No rash noted. She is not diaphoretic.  Psychiatric: She has a normal mood and affect. Her behavior is normal.  Nursing note and vitals reviewed.   Results for orders placed or performed in visit on 08/31/18  CBC with Differential/Platelet  Result Value Ref Range   WBC 5.0 3.4 - 10.8 x10E3/uL   RBC 4.79 3.77 - 5.28 x10E6/uL   Hemoglobin 14.7 11.1 - 15.9 g/dL   Hematocrit 44.0 34.0 - 46.6 %   MCV 92 79 - 97 fL   MCH 30.7 26.6 - 33.0 pg   MCHC 33.4 31.5 - 35.7 g/dL   RDW 13.7 12.3 - 15.4 %   Platelets 218 150 - 450 x10E3/uL   Neutrophils 52 Not Estab. %   Lymphs 34 Not Estab. %   Monocytes 11 Not Estab. %   Eos 2 Not Estab. %   Basos 1 Not Estab. %   Neutrophils Absolute 2.6 1.4 - 7.0 x10E3/uL   Lymphocytes Absolute 1.7 0.7 - 3.1 x10E3/uL   Monocytes Absolute 0.6 0.1 - 0.9 x10E3/uL   EOS (ABSOLUTE) 0.1 0.0 - 0.4  x10E3/uL   Basophils Absolute 0.1 0.0 - 0.2 x10E3/uL   Immature Granulocytes 0 Not Estab. %   Immature Grans (Abs) 0.0 0.0 - 0.1 x10E3/uL  CMP14+EGFR  Result Value Ref Range   Glucose 90 65 - 99 mg/dL   BUN 9 8 - 27 mg/dL   Creatinine, Ser 0.78 0.57 - 1.00 mg/dL   GFR calc non Af Amer 73 >59 mL/min/1.73   GFR calc Af Amer 84 >59 mL/min/1.73   BUN/Creatinine Ratio 12 12 - 28   Sodium 145 (H) 134 - 144 mmol/L   Potassium 3.7 3.5 - 5.2 mmol/L   Chloride 101 96 - 106 mmol/L  CO2 28 20 - 29 mmol/L   Calcium 9.3 8.7 - 10.3 mg/dL   Total Protein 7.2 6.0 - 8.5 g/dL   Albumin 4.5 3.5 - 4.8 g/dL   Globulin, Total 2.7 1.5 - 4.5 g/dL   Albumin/Globulin Ratio 1.7 1.2 - 2.2   Bilirubin Total 0.3 0.0 - 1.2 mg/dL   Alkaline Phosphatase 80 39 - 117 IU/L   AST 20 0 - 40 IU/L   ALT 13 0 - 32 IU/L  Lipid panel  Result Value Ref Range   Cholesterol, Total 201 (H) 100 - 199 mg/dL   Triglycerides 281 (H) 0 - 149 mg/dL   HDL 38 (L) >39 mg/dL   VLDL Cholesterol Cal 56 (H) 5 - 40 mg/dL   LDL Calculated 107 (H) 0 - 99 mg/dL   Chol/HDL Ratio 5.3 (H) 0.0 - 4.4 ratio    EKG: A. fib with RVR in the 120s    Assessment & Plan:   Problem List Items Addressed This Visit      Cardiovascular and Mediastinum   Essential hypertension   Relevant Medications   pravastatin (PRAVACHOL) 40 MG tablet     Other   Hyperlipidemia   Relevant Medications   pravastatin (PRAVACHOL) 40 MG tablet   GAD (generalized anxiety disorder)   Relevant Medications   ALPRAZolam (XANAX) 0.25 MG tablet   Pre-diabetes - Primary    Other Visit Diagnoses    Tachycardia       Relevant Orders   EKG 12-Lead (Completed)   Other fatigue          Restart metoprolol 12.5 mg twice daily  We will try patient on pravastatin instead of simvastatin to see if we can control cholesterol better if she tolerates it. Follow up plan: Return in about 3 months (around 12/08/2018), or if symptoms worsen or fail to improve, for Hypertension  and A. fib recheck.  Counseling provided for all of the vaccine components Orders Placed This Encounter  Procedures  . EKG 12-Lead    Caryl Pina, MD [facility] 09/07/2018, 10:32 AM

## 2018-09-13 ENCOUNTER — Other Ambulatory Visit: Payer: Self-pay | Admitting: Family Medicine

## 2018-09-13 DIAGNOSIS — F411 Generalized anxiety disorder: Secondary | ICD-10-CM

## 2018-09-13 NOTE — Telephone Encounter (Signed)
Last seen 09/07/18  Dr Dettinger

## 2018-09-18 ENCOUNTER — Other Ambulatory Visit: Payer: Self-pay | Admitting: Family Medicine

## 2018-09-21 ENCOUNTER — Other Ambulatory Visit: Payer: Self-pay | Admitting: Family Medicine

## 2018-09-21 DIAGNOSIS — E782 Mixed hyperlipidemia: Secondary | ICD-10-CM

## 2018-09-25 ENCOUNTER — Other Ambulatory Visit: Payer: Self-pay | Admitting: Family Medicine

## 2018-09-26 ENCOUNTER — Encounter: Payer: Self-pay | Admitting: Cardiology

## 2018-09-26 NOTE — Progress Notes (Signed)
Cardiology Office Note   Date:  09/27/2018   ID:  Norma, Barajas [birth year], MRN [id number]  PCP:  Dettinger, Fransisca Kaufmann, MD  Cardiologist:   Minus Breeding, MD   No chief complaint on file.     History of Present Illness: Norma Barajas is a 77 y.o. female who presents for follow up of atrial fibrillation.  She was in this rhythm I see in October when she saw her primary physician.  I do not think she really notices this.  She has been seen by Dr. [name] and Dr. Curt Bears.  I did see her greater than a decade ago.  She did have an essentially normal echo in 2017.  She was treated with Tikosyn for a while but could not afford this.  She has been treated with Sotalol.      She was most recently seen prior to hemorrhoid surgery.  She was cleared from our office for surgery.  She had this done.  She is not reporting any palpitations, presyncope or syncope.  She tolerates warfarin.  She could not afford other agents.  She is not having any presyncope or syncope.  She is not having any chest pressure, neck or arm discomfort.  She really wanted to talk most about her diarrhea which she thinks is related to metoprolol.  However, when metoprolol was stopped before her blood pressure went up.  Past Medical History:  Diagnosis Date  . Anxiety   . Arthritis    "right leg" (04/11/2015)  . Cataract   . Coronary atherosclerosis of native coronary artery    a. Nonobstructive minimal CAD 10/2005.  Marland Kitchen Depression   . Diastolic dysfunction    Grade 1. Ejection fraction 60-65%.  Marland Kitchen Dysrhythmia    a fib  . Erosive esophagitis   . Essential hypertension   . GERD (gastroesophageal reflux disease)   . History of hiatal hernia   . Hypercholesterolemia   . Migraine    "used to have them right bad; I don't now" (04/11/2015)  . Osteoporosis   . Paroxysmal atrial fibrillation (HCC)   . PONV (postoperative nausea and vomiting)   . Vertigo     Past Surgical History:  Procedure Laterality Date  . BRAVO  [place] STUDY  11/15/2012   Procedure: BRAVO [place];  Surgeon: [name], MD;  Location: [facility];  Service: Endoscopy;;  . CATARACT EXTRACTION W/ INTRAOCULAR LENS  IMPLANT, BILATERAL Bilateral   . COLONOSCOPY  2008   Dr. [name]: internal hemorrhoids   . DILATION AND CURETTAGE OF UTERUS    . ESOPHAGOGASTRODUODENOSCOPY (EGD) WITH ESOPHAGEAL DILATION  2001   Dr. [name]: erosive esophagitis, esophageal stricture, duodenitis, s/p Savary dilation  . ESOPHAGOGASTRODUODENOSCOPY (EGD) WITH ESOPHAGEAL DILATION  11/15/2012   [id number] web was found & MOST LIKELY CAUSE FOR DYAPHAGIA/Polyp was found in the gastric body and gastric fundus/ gastritis on bx  . EYE SURGERY Bilateral    "laser OR after cataract OR; cause I couldn't see"  . FLEXIBLE SIGMOIDOSCOPY N/A 08/22/2014   Procedure: FLEXIBLE SIGMOIDOSCOPY;  Surgeon: [name], MD;  Location: [facility];  Service: Endoscopy;  Laterality: N/A;  830  . FRACTURE SURGERY Right    below the knee - 2 bones broke has plates  . HEMORRHOID BANDING N/A 08/22/2014   Procedure: HEMORRHOID BANDING;  Surgeon: [name], MD;  Location: [facility];  Service: Endoscopy;  Laterality: N/A;  . HEMORRHOID SURGERY N/A 03/30/2018   Procedure: EXTENSIVE  HEMORRHOIDECTOMY;  Surgeon: [name], MD;  Location: [facility];  Service: General;  Laterality: N/A;  . OPEN REDUCTION INTERNAL FIXATION (ORIF) TIBIA/FIBULA FRACTURE Right 2013   broke tibia and fibula after falling down stairs  . PROLAPSED UTERINE FIBROID LIGATION  2015  . TOTAL ABDOMINAL HYSTERECTOMY       Current Outpatient Medications  Medication Sig Dispense Refill  . ALPRAZolam (XANAX) 0.25 MG tablet TAKE  (1)  TABLET TWICE A DAY. 60 tablet 2  . amLODipine (NORVASC) 10 MG tablet TAKE 1 TABLET EVERY DAY 90 tablet 1  . Calcium Carbonate-Vitamin D (CALCIUM-VITAMIN D3 PO) Take 1 tablet by mouth daily.    . metoprolol tartrate (LOPRESSOR) 25 MG tablet TAKE 1/2 TABLET TWICE DAILY 90 tablet  3  . Multiple Vitamin (MULTIVITAMIN) capsule Take 1 capsule by mouth every evening.    Marland Kitchen omeprazole (PRILOSEC) 20 MG capsule TAKE 1 CAPSULE EVERY DAY 90 capsule 0  . simvastatin (ZOCOR) 40 MG tablet Take 40 mg by mouth daily.    . sotalol (BETAPACE) 80 MG tablet TAKE 1 TABLET EVERY DAY 90 tablet 1  . warfarin (COUMADIN) 3 MG tablet TAKE 1/2 TO 1 TABLET EVERY DAY AS DIRECTED  BY  ANTICOAGULATION  CLINIC (Patient taking differently: MANAGED BY PMD) 90 tablet 0   No current facility-administered medications for this visit.     Allergies:   Codeine; Triamterene-hctz; Atorvastatin; Crestor [rosuvastatin calcium]; Morphine; and Risedronate sodium    ROS:  Please see the history of present illness.   Otherwise, review of systems are positive for diarrhea, bruising, anxiety, dizziness, tendinitis.   All other systems are reviewed and negative.    PHYSICAL EXAM: VS:  BP (!) 144/77   Pulse 68   Ht 5\' 1"  (1.549 m)   Wt 134 lb 3.2 oz (60.9 kg)   SpO2 98%   BMI 25.36 kg/m  , BMI Body mass index is 25.36 kg/m. GENERAL:  Well appearing HEENT:  Pupils equal round and reactive, fundi not visualized, oral mucosa unremarkable NECK:  No jugular venous distention, waveform within normal limits, carotid upstroke brisk and symmetric, no bruits, no thyromegaly LYMPHATICS:  No cervical, inguinal adenopathy LUNGS:  Clear to auscultation bilaterally BACK:  No CVA tenderness CHEST:  Unremarkable HEART:  PMI not displaced or sustained,S1 and S2 within normal limits, no S3, no S4, no clicks, no rubs, no murmurs ABD:  Flat, positive bowel sounds normal in frequency in pitch, no bruits, no rebound, no guarding, no midline pulsatile mass, no hepatomegaly, no splenomegaly EXT:  2 plus pulses throughout, no edema, no cyanosis no clubbing SKIN:  No rashes no nodules NEURO:  Cranial nerves II through XII grossly intact, motor grossly intact throughout PSYCH:  Cognitively intact, oriented to person place and  time    EKG:  EKG is ordered today. The ekg ordered today demonstrates sinus rhythm, rate 65, axis within normal limits.     Recent Labs: 08/31/2018: ALT 13; BUN 9; Creatinine, Ser 0.78; Hemoglobin 14.7; Platelets 218; Potassium 3.7; Sodium 145    Lipid Panel    Component Value Date/Time   CHOL 201 (H) 08/31/2018 0820   CHOL 192 05/15/2013 0829   TRIG 281 (H) 08/31/2018 0820   TRIG 258 (H) 03/19/2014 0812   TRIG 179 (H) 05/15/2013 0829   HDL 38 (L) 08/31/2018 0820   HDL 45 03/19/2014 0812   HDL 44 05/15/2013 0829   CHOLHDL 5.3 (H) 08/31/2018 0820   CHOLHDL 3.9 02/10/2016 0208   VLDL  33 02/10/2016 0208   LDLCALC 107 (H) 08/31/2018 0820   LDLCALC 101 (H) 03/19/2014 0812   LDLCALC 112 (H) 05/15/2013 0829      Wt Readings from Last 3 Encounters:  09/27/18 134 lb 3.2 oz (60.9 kg)  09/07/18 134 lb 9.6 oz (61.1 kg)  08/02/18 132 lb 6 oz (60 kg)      Other studies Reviewed: Additional studies/ records that were reviewed today include: Office records, EP report. Review of the above records demonstrates:  Please see elsewhere in the note.     ASSESSMENT AND PLAN:  ATRIAL FIBRILLATION:   Today she is not in atrial fibrillation.  And then continue the meds as listed.  HTN: Her blood pressure is slightly elevated.  She just had her metoprolol restarted.  I will make no change in therapy.  DYSLIPIDEMIA: Lipids are managed per Dettinger, Fransisca Kaufmann, MD   DIARRHEA: We spent quite a bit of time discussing this.  I suggested if she thinks it is related to the metoprolol we have to stop that and probably start something else for blood pressure.  She says she just wants to put up with the diarrhea and does not want to try different therapy at this point.  She understands that Prilosec has a 4% chance of causing her diarrhea and beta-blocker in 2 to 5% chance but is also very likely that it is not related to medications.  I suggested she talk over the whole situation with Dettinger,  Fransisca Kaufmann, MD   Current medicines are reviewed at length with the patient today.  The patient does not have concerns regarding medicines.  The following changes have been made:  None  Labs/ tests ordered today include: None  Orders Placed This Encounter  Procedures  . EKG 12-Lead     Disposition:   FU with me in six months.     Signed, Minus Breeding, MD  09/27/2018 3:34 PM    [facility]

## 2018-09-27 ENCOUNTER — Ambulatory Visit: Payer: Medicare HMO | Admitting: Cardiology

## 2018-09-27 ENCOUNTER — Ambulatory Visit (INDEPENDENT_AMBULATORY_CARE_PROVIDER_SITE_OTHER): Payer: Medicare HMO | Admitting: Pharmacist Clinician (PhC)/ Clinical Pharmacy Specialist

## 2018-09-27 ENCOUNTER — Encounter: Payer: Self-pay | Admitting: Cardiology

## 2018-09-27 VITALS — BP 144/77 | HR 68 | Ht 61.0 in | Wt 134.2 lb

## 2018-09-27 DIAGNOSIS — R197 Diarrhea, unspecified: Secondary | ICD-10-CM

## 2018-09-27 DIAGNOSIS — I10 Essential (primary) hypertension: Secondary | ICD-10-CM | POA: Diagnosis not present

## 2018-09-27 DIAGNOSIS — E785 Hyperlipidemia, unspecified: Secondary | ICD-10-CM | POA: Diagnosis not present

## 2018-09-27 DIAGNOSIS — I4891 Unspecified atrial fibrillation: Secondary | ICD-10-CM

## 2018-09-27 LAB — COAGUCHEK XS/INR WAIVED
INR: 2 — AB (ref 0.9–1.1)
PROTHROMBIN TIME: 24.4 s

## 2018-09-27 MED ORDER — PRAVASTATIN SODIUM 40 MG PO TABS: 40.0000 mg | ORAL_TABLET | Freq: Every day | ORAL | 1 refills | Status: DC

## 2018-09-27 NOTE — Patient Instructions (Signed)
Description   Continue taking warfarin the same way.  INR was 2.0 today (goal is 2.0 to 3.0)  [place]      

## 2018-09-27 NOTE — Patient Instructions (Signed)
Medication Instructions:  The current medical regimen is effective;  continue present plan and medications.  If you need a refill on your cardiac medications before your next appointment, please call your pharmacy.   Follow-Up: Follow up in 6 months with Dr. Hochrein in [place].  You will receive a letter in the mail 2 months before you are due.  Please call us when you receive this letter to schedule your follow up appointment.  Thank you for choosing [facility]!!     

## 2018-09-27 NOTE — Addendum Note (Signed)
Addended by: Antonietta Barcelona D on: 09/27/2018 08:59 AM   Modules accepted: Orders

## 2018-10-19 ENCOUNTER — Other Ambulatory Visit: Payer: Self-pay | Admitting: Family Medicine

## 2018-10-23 ENCOUNTER — Other Ambulatory Visit: Payer: Self-pay | Admitting: Family Medicine

## 2018-11-02 ENCOUNTER — Telehealth: Payer: Self-pay | Admitting: *Deleted

## 2018-11-02 NOTE — Telephone Encounter (Signed)
Pt called in states that she is having some hallucinations and is hearing things at times. She is determined that it is coming from her Metoprolol that she has been on long term. sppt made with Dettinger next week and aware to call back or go to the ER if symptoms worsen.

## 2018-11-09 ENCOUNTER — Ambulatory Visit: Payer: Medicare HMO | Admitting: Family Medicine

## 2018-11-15 ENCOUNTER — Ambulatory Visit: Payer: Medicare HMO | Admitting: Pharmacist Clinician (PhC)/ Clinical Pharmacy Specialist

## 2018-11-15 DIAGNOSIS — I4891 Unspecified atrial fibrillation: Secondary | ICD-10-CM | POA: Diagnosis not present

## 2018-11-15 LAB — COAGUCHEK XS/INR WAIVED
INR: 2.2 — ABNORMAL HIGH (ref 0.9–1.1)
Prothrombin Time: 26.4 s

## 2018-11-15 MED ORDER — PRAVASTATIN SODIUM 20 MG PO TABS: 20.0000 mg | ORAL_TABLET | Freq: Every day | ORAL | 3 refills | Status: DC

## 2018-11-15 NOTE — Patient Instructions (Signed)
Description   Continue taking warfarin the same way  INR was 2.2 today (goal is 2.0 to 3.0)  [place]

## 2018-11-23 ENCOUNTER — Other Ambulatory Visit: Payer: Self-pay | Admitting: Family Medicine

## 2018-12-04 ENCOUNTER — Other Ambulatory Visit: Payer: Medicare HMO

## 2018-12-04 DIAGNOSIS — I10 Essential (primary) hypertension: Secondary | ICD-10-CM

## 2018-12-04 DIAGNOSIS — E782 Mixed hyperlipidemia: Secondary | ICD-10-CM | POA: Diagnosis not present

## 2018-12-04 LAB — CMP14+EGFR
ALT: 11 IU/L (ref 0–32)
AST: 17 IU/L (ref 0–40)
Albumin/Globulin Ratio: 1.6 (ref 1.2–2.2)
Albumin: 4.2 g/dL (ref 3.7–4.7)
Alkaline Phosphatase: 93 IU/L (ref 39–117)
BUN/Creatinine Ratio: 14 (ref 12–28)
BUN: 11 mg/dL (ref 8–27)
Bilirubin Total: 0.4 mg/dL (ref 0.0–1.2)
CO2: 24 mmol/L (ref 20–29)
Calcium: 8.9 mg/dL (ref 8.7–10.3)
Chloride: 102 mmol/L (ref 96–106)
Creatinine, Ser: 0.76 mg/dL (ref 0.57–1.00)
GFR calc Af Amer: 86 mL/min/{1.73_m2} (ref >59)
GFR calc non Af Amer: 75 mL/min/{1.73_m2} (ref >59)
GLUCOSE: 96 mg/dL (ref 65–99)
Globulin, Total: 2.6 g/dL (ref 1.5–4.5)
Potassium: 3.8 mmol/L (ref 3.5–5.2)
Sodium: 143 mmol/L (ref 134–144)
Total Protein: 6.8 g/dL (ref 6.0–8.5)

## 2018-12-04 LAB — CBC WITH DIFFERENTIAL/PLATELET
BASOS ABS: 0.1 10*3/uL (ref 0.0–0.2)
Basos: 1 %
EOS (ABSOLUTE): 0.1 10*3/uL (ref 0.0–0.4)
Eos: 2 %
HEMOGLOBIN: 14.2 g/dL (ref 11.1–15.9)
Hematocrit: 42.4 % (ref 34.0–46.6)
Immature Grans (Abs): 0 10*3/uL (ref 0.0–0.1)
Immature Granulocytes: 0 %
Lymphocytes Absolute: 1.4 10*3/uL (ref 0.7–3.1)
Lymphs: 29 %
MCH: 30.5 pg (ref 26.6–33.0)
MCHC: 33.5 g/dL (ref 31.5–35.7)
MCV: 91 fL (ref 79–97)
Monocytes Absolute: 0.5 10*3/uL (ref 0.1–0.9)
Monocytes: 11 %
Neutrophils Absolute: 2.7 10*3/uL (ref 1.4–7.0)
Neutrophils: 57 %
Platelets: 199 10*3/uL (ref 150–450)
RBC: 4.66 x10E6/uL (ref 3.77–5.28)
RDW: 13.6 % (ref 11.7–15.4)
WBC: 4.8 10*3/uL (ref 3.4–10.8)

## 2018-12-04 LAB — LIPID PANEL
CHOLESTEROL TOTAL: 171 mg/dL (ref 100–199)
Chol/HDL Ratio: 4.6 ratio — ABNORMAL HIGH (ref 0.0–4.4)
HDL: 37 mg/dL — ABNORMAL LOW (ref >39)
LDL Calculated: 73 mg/dL (ref 0–99)
Triglycerides: 307 mg/dL — ABNORMAL HIGH (ref 0–149)
VLDL CHOLESTEROL CAL: 61 mg/dL — AB (ref 5–40)

## 2018-12-08 ENCOUNTER — Encounter: Payer: Self-pay | Admitting: *Deleted

## 2018-12-08 ENCOUNTER — Ambulatory Visit (INDEPENDENT_AMBULATORY_CARE_PROVIDER_SITE_OTHER): Payer: Medicare HMO | Admitting: Family Medicine

## 2018-12-08 ENCOUNTER — Encounter: Payer: Self-pay | Admitting: Family Medicine

## 2018-12-08 VITALS — BP 143/76 | HR 68 | Temp 97.0°F | Ht 61.0 in | Wt 136.4 lb

## 2018-12-08 DIAGNOSIS — K219 Gastro-esophageal reflux disease without esophagitis: Secondary | ICD-10-CM

## 2018-12-08 DIAGNOSIS — E785 Hyperlipidemia, unspecified: Secondary | ICD-10-CM | POA: Diagnosis not present

## 2018-12-08 DIAGNOSIS — I10 Essential (primary) hypertension: Secondary | ICD-10-CM

## 2018-12-08 DIAGNOSIS — R7303 Prediabetes: Secondary | ICD-10-CM | POA: Diagnosis not present

## 2018-12-08 MED ORDER — OMEPRAZOLE 20 MG PO CPDR: 20.0000 mg | DELAYED_RELEASE_CAPSULE | Freq: Every day | ORAL | 3 refills | Status: DC

## 2018-12-08 MED ORDER — WARFARIN SODIUM 3 MG PO TABS: ORAL_TABLET | ORAL | 1 refills | Status: DC

## 2018-12-08 NOTE — Progress Notes (Signed)
BP (!) 143/76   Pulse 68   Temp (!) 97 F (36.1 C) (Oral)   Ht '5\' 1"'$  (1.549 m)   Wt 136 lb 6.4 oz (61.9 kg)   BMI 25.77 kg/m    Subjective:    Patient ID: Norma Barajas, female    DOB: 02/08/1941, 78 y.o.   MRN: [id number]  HPI: Norma Barajas is a 78 y.o. female presenting on 12/08/2018 for Hypertension (3 month follow up) and Hyperlipidemia   HPI Hypertension Patient is currently on metoprolol and amlodipine and sotalol, and their blood pressure today is 143/76. Patient denies any lightheadedness or dizziness. Patient denies headaches, blurred vision, chest pains, shortness of breath, or weakness. Denies any side effects from medication and is content with current medication.   Prediabetes Patient comes in today for recheck of his diabetes. Patient has been currently taking no medication and has been diet controlled and we are monitoring for now, her blood sugars continue to look good. Patient is not currently on an ACE inhibitor/ARB. Patient has not seen an ophthalmologist this year. Patient denies any issues with their feet.   Hyperlipidemia Patient is coming in for recheck of his hyperlipidemia. The patient is currently taking simvastatin, she says she was very intolerant of the pravastatin did not do well on it. They deny any issues with myalgias or history of liver damage from it. They deny any focal numbness or weakness or chest pain.   GERD Patient is currently on omeprazole.  She does having some belching and burping occasionally and indigestion occasionally, she is still taking omeprazole, recommended for her to pick up over-the-counter Pepcid and take it only as needed.. She denies any blood in her stool or lightheadedness or dizziness.   Relevant past medical, surgical, family and social history reviewed and updated as indicated. Interim medical history since our last visit reviewed. Allergies and medications reviewed and updated.  Review of Systems  Constitutional:  Negative for chills and fever.  Eyes: Negative for visual disturbance.  Respiratory: Negative for chest tightness and shortness of breath.   Cardiovascular: Negative for chest pain and leg swelling.  Musculoskeletal: Negative for back pain and gait problem.  Skin: Negative for rash.  Neurological: Negative for dizziness, light-headedness and headaches.  Psychiatric/Behavioral: Negative for agitation and behavioral problems.  All other systems reviewed and are negative.   Per HPI unless specifically indicated above   Allergies as of 12/08/2018      Reactions   Codeine Nausea And Vomiting   Triamterene-hctz Other (See Comments)   weakness   Atorvastatin Other (See Comments)   Myalgias   Crestor [rosuvastatin Calcium] Other (See Comments)   weakness   Morphine Nausea Only   Risedronate Sodium Other (See Comments)   ACTONEL REACTION: reflux      Medication List       Accurate as of December 08, 2018  9:38 AM. Always use your most recent med list.        ALPRAZolam 0.25 MG tablet Commonly known as:  XANAX TAKE  (1)  TABLET TWICE A DAY.   amLODipine 10 MG tablet Commonly known as:  NORVASC TAKE 1 TABLET EVERY DAY   CALCIUM-VITAMIN D3 PO Take 1 tablet by mouth daily.   metoprolol tartrate 25 MG tablet Commonly known as:  LOPRESSOR TAKE 1/2 TABLET TWICE DAILY   multivitamin capsule Take 1 capsule by mouth every evening.   omeprazole 20 MG capsule Commonly known as:  PRILOSEC TAKE 1 CAPSULE EVERY DAY  sotalol 80 MG tablet Commonly known as:  BETAPACE TAKE 1 TABLET EVERY DAY   warfarin 3 MG tablet Commonly known as:  COUMADIN Take as directed by the anticoagulation clinic. If you are unsure how to take this medication, talk to your nurse or doctor. Original instructions:  TAKE 1/2 TO 1 TABLET EVERY DAY AS DIRECTED  BY  ANTICOAGULATION  CLINIC   ZOCOR 40 MG tablet Generic drug:  simvastatin Take 40 mg by mouth daily.          Objective:    BP (!) 143/76    Pulse 68   Temp (!) 97 F (36.1 C) (Oral)   Ht '5\' 1"'$  (1.549 m)   Wt 136 lb 6.4 oz (61.9 kg)   BMI 25.77 kg/m   Wt Readings from Last 3 Encounters:  12/08/18 136 lb 6.4 oz (61.9 kg)  09/27/18 134 lb 3.2 oz (60.9 kg)  09/07/18 134 lb 9.6 oz (61.1 kg)    Physical Exam Vitals signs and nursing note reviewed.  Constitutional:      General: She is not in acute distress.    Appearance: She is well-developed. She is not diaphoretic.  Eyes:     Conjunctiva/sclera: Conjunctivae normal.  Cardiovascular:     Rate and Rhythm: Normal rate and regular rhythm.     Heart sounds: Normal heart sounds. No murmur.  Pulmonary:     Effort: Pulmonary effort is normal. No respiratory distress.     Breath sounds: Normal breath sounds. No wheezing.  Musculoskeletal: Normal range of motion.        General: No tenderness.  Skin:    General: Skin is warm and dry.     Findings: No rash.  Neurological:     Mental Status: She is alert and oriented to person, place, and time.     Coordination: Coordination normal.  Psychiatric:        Behavior: Behavior normal.     Results for orders placed or performed in visit on 12/04/18  CBC with Differential/Platelet  Result Value Ref Range   WBC 4.8 3.4 - 10.8 x10E3/uL   RBC 4.66 3.77 - 5.28 x10E6/uL   Hemoglobin 14.2 11.1 - 15.9 g/dL   Hematocrit 42.4 34.0 - 46.6 %   MCV 91 79 - 97 fL   MCH 30.5 26.6 - 33.0 pg   MCHC 33.5 31.5 - 35.7 g/dL   RDW 13.6 11.7 - 15.4 %   Platelets 199 150 - 450 x10E3/uL   Neutrophils 57 Not Estab. %   Lymphs 29 Not Estab. %   Monocytes 11 Not Estab. %   Eos 2 Not Estab. %   Basos 1 Not Estab. %   Neutrophils Absolute 2.7 1.4 - 7.0 x10E3/uL   Lymphocytes Absolute 1.4 0.7 - 3.1 x10E3/uL   Monocytes Absolute 0.5 0.1 - 0.9 x10E3/uL   EOS (ABSOLUTE) 0.1 0.0 - 0.4 x10E3/uL   Basophils Absolute 0.1 0.0 - 0.2 x10E3/uL   Immature Granulocytes 0 Not Estab. %   Immature Grans (Abs) 0.0 0.0 - 0.1 x10E3/uL  CMP14+EGFR  Result  Value Ref Range   Glucose 96 65 - 99 mg/dL   BUN 11 8 - 27 mg/dL   Creatinine, Ser 0.76 0.57 - 1.00 mg/dL   GFR calc non Af Amer 75 >59 mL/min/1.73   GFR calc Af Amer 86 >59 mL/min/1.73   BUN/Creatinine Ratio 14 12 - 28   Sodium 143 134 - 144 mmol/L   Potassium 3.8 3.5 - 5.2 mmol/L  Chloride 102 96 - 106 mmol/L   CO2 24 20 - 29 mmol/L   Calcium 8.9 8.7 - 10.3 mg/dL   Total Protein 6.8 6.0 - 8.5 g/dL   Albumin 4.2 3.7 - 4.7 g/dL   Globulin, Total 2.6 1.5 - 4.5 g/dL   Albumin/Globulin Ratio 1.6 1.2 - 2.2   Bilirubin Total 0.4 0.0 - 1.2 mg/dL   Alkaline Phosphatase 93 39 - 117 IU/L   AST 17 0 - 40 IU/L   ALT 11 0 - 32 IU/L  Lipid panel  Result Value Ref Range   Cholesterol, Total 171 100 - 199 mg/dL   Triglycerides 307 (H) 0 - 149 mg/dL   HDL 37 (L) >39 mg/dL   VLDL Cholesterol Cal 61 (H) 5 - 40 mg/dL   LDL Calculated 73 0 - 99 mg/dL   Chol/HDL Ratio 4.6 (H) 0.0 - 4.4 ratio      Assessment & Plan:   Problem List Items Addressed This Visit      Cardiovascular and Mediastinum   Essential hypertension   Relevant Medications   simvastatin (ZOCOR) 40 MG tablet   warfarin (COUMADIN) 3 MG tablet     Digestive   GERD   Relevant Medications   omeprazole (PRILOSEC) 20 MG capsule     Other   Pre-diabetes - Primary   Dyslipidemia   Relevant Medications   simvastatin (ZOCOR) 40 MG tablet      Patient said she was intolerant of the pravastatin he went back on the Zocor and says it does much better for her. Follow up plan: Return in about 6 months (around 06/08/2019), or if symptoms worsen or fail to improve, for pred.  Counseling provided for all of the vaccine components No orders of the defined types were placed in this encounter.   Caryl Pina, MD [facility] 12/08/2018, 9:38 AM

## 2018-12-11 ENCOUNTER — Ambulatory Visit (INDEPENDENT_AMBULATORY_CARE_PROVIDER_SITE_OTHER): Payer: Medicare HMO | Admitting: Family Medicine

## 2018-12-11 ENCOUNTER — Encounter: Payer: Self-pay | Admitting: Family Medicine

## 2018-12-11 ENCOUNTER — Ambulatory Visit (INDEPENDENT_AMBULATORY_CARE_PROVIDER_SITE_OTHER): Payer: Medicare HMO

## 2018-12-11 VITALS — BP 128/79 | HR 85 | Temp 97.4°F | Ht 61.0 in | Wt 135.0 lb

## 2018-12-11 DIAGNOSIS — M1711 Unilateral primary osteoarthritis, right knee: Secondary | ICD-10-CM | POA: Diagnosis not present

## 2018-12-11 DIAGNOSIS — M25561 Pain in right knee: Secondary | ICD-10-CM

## 2018-12-11 MED ORDER — DICLOFENAC SODIUM 1 % TD GEL: 2.0000 g | Freq: Four times a day (QID) | TRANSDERMAL | 0 refills | Status: DC

## 2018-12-11 NOTE — Progress Notes (Signed)
Subjective:    Patient ID: Norma Barajas, female    DOB: Aug 01, 1941, 78 y.o.   MRN: [id number]  Chief Complaint:  Right leg pain (whole leg hurts but knee is worse)   HPI: Norma Barajas is a 78 y.o. female presenting on 12/11/2018 for Right leg pain (whole leg hurts but knee is worse)   1. Acute pain of right knee   Pt presents today with complaints of right knee pain that radiates down into her lower leg. Pt denies injury or erythema. States she has had slight swelling. States her husband broke his foot and she has had to take care of him over the last few weeks. She states the pain has become worse since increasing her activity. States the pain became worse on Saturday. States it is aching and a 4/10 at present. States she has been taking tylenol with relief of the pain for several hours. She denied fever, erythema, or loss of function.    Relevant past medical, surgical, family, and social history reviewed and updated as indicated.  Allergies and medications reviewed and updated.   Past Medical History:  Diagnosis Date  . Anxiety   . Arthritis    "right leg" (04/11/2015)  . Cataract   . Coronary atherosclerosis of native coronary artery    a. Nonobstructive minimal CAD 10/2005.  Marland Kitchen Depression   . Diastolic dysfunction    Grade 1. Ejection fraction 60-65%.  Marland Kitchen Dysrhythmia    a fib  . Erosive esophagitis   . Essential hypertension   . GERD (gastroesophageal reflux disease)   . History of hiatal hernia   . Hypercholesterolemia   . Migraine    "used to have them right bad; I don't now" (04/11/2015)  . Osteoporosis   . Paroxysmal atrial fibrillation (HCC)   . PONV (postoperative nausea and vomiting)   . Vertigo     Past Surgical History:  Procedure Laterality Date  . BRAVO [place] STUDY  11/15/2012   Procedure: BRAVO [place];  Surgeon: [name], MD;  Location: [facility];  Service: Endoscopy;;  . CATARACT EXTRACTION W/ INTRAOCULAR LENS  IMPLANT, BILATERAL Bilateral   .  COLONOSCOPY  2008   Dr. [name]: internal hemorrhoids   . DILATION AND CURETTAGE OF UTERUS    . ESOPHAGOGASTRODUODENOSCOPY (EGD) WITH ESOPHAGEAL DILATION  2001   Dr. [name]: erosive esophagitis, esophageal stricture, duodenitis, s/p Savary dilation  . ESOPHAGOGASTRODUODENOSCOPY (EGD) WITH ESOPHAGEAL DILATION  11/15/2012   [id number] web was found & MOST LIKELY CAUSE FOR DYAPHAGIA/Polyp was found in the gastric body and gastric fundus/ gastritis on bx  . EYE SURGERY Bilateral    "laser OR after cataract OR; cause I couldn't see"  . FLEXIBLE SIGMOIDOSCOPY N/A 08/22/2014   Procedure: FLEXIBLE SIGMOIDOSCOPY;  Surgeon: [name], MD;  Location: [facility];  Service: Endoscopy;  Laterality: N/A;  830  . FRACTURE SURGERY Right    below the knee - 2 bones broke has plates  . HEMORRHOID BANDING N/A 08/22/2014   Procedure: HEMORRHOID BANDING;  Surgeon: [name], MD;  Location: [facility];  Service: Endoscopy;  Laterality: N/A;  . HEMORRHOID SURGERY N/A 03/30/2018   Procedure: EXTENSIVE HEMORRHOIDECTOMY;  Surgeon: [name], MD;  Location: [facility];  Service: General;  Laterality: N/A;  . OPEN REDUCTION INTERNAL FIXATION (ORIF) TIBIA/FIBULA FRACTURE Right 2013   broke tibia and fibula after falling down stairs  . PROLAPSED UTERINE FIBROID LIGATION  2015  . TOTAL  ABDOMINAL HYSTERECTOMY      Social History   Socioeconomic History  . Marital status: Married    Spouse name: george   . Number of children: 1  . Years of education: Not on file  . Highest education level: Not on file  Occupational History  . Occupation: Retired    Comment: Education officer, community  . Financial resource strain: Not on file  . Food insecurity:    Worry: Not on file    Inability: Not on file  . Transportation needs:    Medical: Not on file    Non-medical: Not on file  Tobacco Use  . Smoking status: Never Smoker  . Smokeless tobacco: Never Used  Substance and Sexual Activity  . Alcohol  use: No    Alcohol/week: 0.0 standard drinks  . Drug use: No  . Sexual activity: Not Currently    Birth control/protection: Surgical, Post-menopausal    Comment: hyst  Lifestyle  . Physical activity:    Days per week: Not on file    Minutes per session: Not on file  . Stress: Not on file  Relationships  . Social connections:    Talks on phone: Not on file    Gets together: Not on file    Attends religious service: Not on file    Active member of club or organization: Not on file    Attends meetings of clubs or organizations: Not on file    Relationship status: Not on file  . Intimate partner violence:    Fear of current or ex partner: Not on file    Emotionally abused: Not on file    Physically abused: Not on file    Forced sexual activity: Not on file  Other Topics Concern  . Not on file  Social History Narrative   Married   No regular exercise    Outpatient Encounter Medications as of 12/11/2018  Medication Sig  . ALPRAZolam (XANAX) 0.25 MG tablet TAKE  (1)  TABLET TWICE A DAY.  Marland Kitchen amLODipine (NORVASC) 10 MG tablet TAKE 1 TABLET EVERY DAY  . Calcium Carbonate-Vitamin D (CALCIUM-VITAMIN D3 PO) Take 1 tablet by mouth Barajas.  . diclofenac sodium (VOLTAREN) 1 % GEL Apply 2 g topically 4 (four) times Barajas for 30 days.  . metoprolol tartrate (LOPRESSOR) 25 MG tablet TAKE 1/2 TABLET TWICE Barajas  . Multiple Vitamin (MULTIVITAMIN) capsule Take 1 capsule by mouth every evening.  Marland Kitchen omeprazole (PRILOSEC) 20 MG capsule Take 1 capsule (20 mg total) by mouth Barajas.  . simvastatin (ZOCOR) 40 MG tablet Take 40 mg by mouth Barajas.  . sotalol (BETAPACE) 80 MG tablet TAKE 1 TABLET EVERY DAY  . warfarin (COUMADIN) 3 MG tablet TAKE 1/2 TO 1 TABLET EVERY DAY AS DIRECTED  BY  ANTICOAGULATION  CLINIC   No facility-administered encounter medications on file as of 12/11/2018.     Allergies  Allergen Reactions  . Codeine Nausea And Vomiting  . Triamterene-Hctz Other (See Comments)    weakness  .  Atorvastatin Other (See Comments)    Myalgias   . Crestor [Rosuvastatin Calcium] Other (See Comments)    weakness  . Morphine Nausea Only  . Risedronate Sodium Other (See Comments)    ACTONEL REACTION: reflux    Review of Systems  Constitutional: Positive for activity change. Negative for chills, fatigue and fever.  Respiratory: Negative for cough, chest tightness and shortness of breath.   Cardiovascular: Negative for chest pain, palpitations and leg swelling.  Musculoskeletal: Positive  for arthralgias and joint swelling. Negative for back pain, gait problem, myalgias, neck pain and neck stiffness.  Skin: Negative for color change and wound.  Neurological: Negative for dizziness, weakness and numbness.  Psychiatric/Behavioral: Negative for confusion.  All other systems reviewed and are negative.       Objective:    BP 128/79   Pulse 85   Temp (!) 97.4 F (36.3 C) (Oral)   Ht 5' 1"  (1.549 m)   Wt 135 lb (61.2 kg)   BMI 25.51 kg/m    Wt Readings from Last 3 Encounters:  12/11/18 135 lb (61.2 kg)  12/08/18 136 lb 6.4 oz (61.9 kg)  09/27/18 134 lb 3.2 oz (60.9 kg)    Physical Exam Vitals signs and nursing note reviewed.  Constitutional:      Appearance: Normal appearance.  HENT:     Head: Normocephalic and atraumatic.     Mouth/Throat:     Mouth: Mucous membranes are moist.  Eyes:     Conjunctiva/sclera: Conjunctivae normal.     Pupils: Pupils are equal, round, and reactive to light.  Cardiovascular:     Rate and Rhythm: Normal rate. Rhythm regularly irregular.     Pulses: Normal pulses.     Heart sounds: Murmur present. Systolic murmur present with a grade of 2/6. No friction rub. No gallop.   Pulmonary:     Effort: Pulmonary effort is normal. No respiratory distress.     Breath sounds: Normal breath sounds.  Musculoskeletal:     Right hip: Normal.     Right knee: She exhibits decreased range of motion and swelling (minimal). She exhibits no effusion, no  ecchymosis, no deformity, no laceration, no erythema, normal alignment, no LCL laxity, normal patellar mobility, no bony tenderness, normal meniscus and no MCL laxity. No tenderness found.     Right ankle: Normal.     Right lower leg: No edema.     Left lower leg: No edema.  Skin:    General: Skin is warm and dry.     Capillary Refill: Capillary refill takes less than 2 seconds.  Neurological:     General: No focal deficit present.     Mental Status: She is alert and oriented to person, place, and time.  Psychiatric:        Mood and Affect: Mood normal.        Behavior: Behavior normal.        Thought Content: Thought content normal.        Judgment: Judgment normal.     Results for orders placed or performed in visit on 12/04/18  CBC with Differential/Platelet  Result Value Ref Range   WBC 4.8 3.4 - 10.8 x10E3/uL   RBC 4.66 3.77 - 5.28 x10E6/uL   Hemoglobin 14.2 11.1 - 15.9 g/dL   Hematocrit 42.4 34.0 - 46.6 %   MCV 91 79 - 97 fL   MCH 30.5 26.6 - 33.0 pg   MCHC 33.5 31.5 - 35.7 g/dL   RDW 13.6 11.7 - 15.4 %   Platelets 199 150 - 450 x10E3/uL   Neutrophils 57 Not Estab. %   Lymphs 29 Not Estab. %   Monocytes 11 Not Estab. %   Eos 2 Not Estab. %   Basos 1 Not Estab. %   Neutrophils Absolute 2.7 1.4 - 7.0 x10E3/uL   Lymphocytes Absolute 1.4 0.7 - 3.1 x10E3/uL   Monocytes Absolute 0.5 0.1 - 0.9 x10E3/uL   EOS (ABSOLUTE) 0.1 0.0 - 0.4 x10E3/uL  Basophils Absolute 0.1 0.0 - 0.2 x10E3/uL   Immature Granulocytes 0 Not Estab. %   Immature Grans (Abs) 0.0 0.0 - 0.1 x10E3/uL  CMP14+EGFR  Result Value Ref Range   Glucose 96 65 - 99 mg/dL   BUN 11 8 - 27 mg/dL   Creatinine, Ser 0.76 0.57 - 1.00 mg/dL   GFR calc non Af Amer 75 >59 mL/min/1.73   GFR calc Af Amer 86 >59 mL/min/1.73   BUN/Creatinine Ratio 14 12 - 28   Sodium 143 134 - 144 mmol/L   Potassium 3.8 3.5 - 5.2 mmol/L   Chloride 102 96 - 106 mmol/L   CO2 24 20 - 29 mmol/L   Calcium 8.9 8.7 - 10.3 mg/dL   Total  Protein 6.8 6.0 - 8.5 g/dL   Albumin 4.2 3.7 - 4.7 g/dL   Globulin, Total 2.6 1.5 - 4.5 g/dL   Albumin/Globulin Ratio 1.6 1.2 - 2.2   Bilirubin Total 0.4 0.0 - 1.2 mg/dL   Alkaline Phosphatase 93 39 - 117 IU/L   AST 17 0 - 40 IU/L   ALT 11 0 - 32 IU/L  Lipid panel  Result Value Ref Range   Cholesterol, Total 171 100 - 199 mg/dL   Triglycerides 307 (H) 0 - 149 mg/dL   HDL 37 (L) >39 mg/dL   VLDL Cholesterol Cal 61 (H) 5 - 40 mg/dL   LDL Calculated 73 0 - 99 mg/dL   Chol/HDL Ratio 4.6 (H) 0.0 - 4.4 ratio     X-Ray: Right knee: Hardware intact. Degenerative changes. No acute findings. Preliminary x-ray reading by Monia Pouch, FNP-C, [facility].   Pertinent labs & imaging results that were available during my care of the patient were reviewed by me and considered in my medical decision making.  Assessment & Plan:  Norma Barajas was seen today for right leg pain.  Diagnoses and all orders for this visit:  Acute pain of right knee Continue tylenol every 6 hours as needed for pain. Rest, ice or heat, elevation, and compression. Will add Voltaren gel. Xray with no acute finsings, will notify if radiology reading differs. Report any new or worsening symptoms.  -     DG Knee 1-2 Views Right; Future -     diclofenac sodium (VOLTAREN) 1 % GEL; Apply 2 g topically 4 (four) times Barajas for 30 days.     Continue all other maintenance medications.  Follow up plan: Return in about 4 weeks (around 01/08/2019), or if symptoms worsen or fail to improve.  Educational handout given for knee pain  The above assessment and management plan was discussed with the patient. The patient verbalized understanding of and has agreed to the management plan. Patient is aware to call the clinic if symptoms persist or worsen. Patient is aware when to return to the clinic for a follow-up visit. Patient educated on when it is appropriate to go to the emergency department.   Monia Pouch, FNP-C [facility] [phone]

## 2018-12-11 NOTE — Patient Instructions (Signed)

## 2019-01-03 ENCOUNTER — Ambulatory Visit: Payer: Medicare HMO | Admitting: Pharmacist Clinician (PhC)/ Clinical Pharmacy Specialist

## 2019-01-03 ENCOUNTER — Encounter: Payer: Self-pay | Admitting: Pharmacist Clinician (PhC)/ Clinical Pharmacy Specialist

## 2019-01-03 DIAGNOSIS — I4891 Unspecified atrial fibrillation: Secondary | ICD-10-CM

## 2019-01-03 LAB — COAGUCHEK XS/INR WAIVED
INR: 2.6 — ABNORMAL HIGH (ref 0.9–1.1)
Prothrombin Time: 31 s

## 2019-01-03 NOTE — Patient Instructions (Signed)
Description   Continue taking warfarin the same way  INR was 2.6 today (goal is 2.0 to 3.0)  Perfect Reading

## 2019-01-04 ENCOUNTER — Encounter: Payer: Self-pay | Admitting: Family Medicine

## 2019-01-04 ENCOUNTER — Ambulatory Visit (INDEPENDENT_AMBULATORY_CARE_PROVIDER_SITE_OTHER): Payer: Medicare HMO | Admitting: Family Medicine

## 2019-01-04 VITALS — BP 150/91 | HR 75 | Temp 97.5°F | Ht 61.0 in | Wt 136.0 lb

## 2019-01-04 DIAGNOSIS — J069 Acute upper respiratory infection, unspecified: Secondary | ICD-10-CM | POA: Diagnosis not present

## 2019-01-04 DIAGNOSIS — H66001 Acute suppurative otitis media without spontaneous rupture of ear drum, right ear: Secondary | ICD-10-CM | POA: Diagnosis not present

## 2019-01-04 MED ORDER — BENZONATATE 100 MG PO CAPS: 100.0000 mg | ORAL_CAPSULE | Freq: Three times a day (TID) | ORAL | 0 refills | Status: DC | PRN

## 2019-01-04 MED ORDER — AMOXICILLIN-POT CLAVULANATE 875-125 MG PO TABS: 1.0000 | ORAL_TABLET | Freq: Two times a day (BID) | ORAL | 0 refills | Status: AC

## 2019-01-04 NOTE — Progress Notes (Signed)
    Subjective:     Norma Barajas is a 78 y.o. female who presents for evaluation of symptoms of a URI, right ear pain. Symptoms include right ear pressure/pain, congestion, coryza, cough described as nonproductive, low grade fever, nasal congestion, post nasal drip, sneezing and sore throat. Onset of symptoms was 3 days ago, and has been gradually worsening since that time. Treatment to date: decongestants.  The following portions of the patient's history were reviewed and updated as appropriate: allergies, current medications, past family history, past medical history, past social history, past surgical history and problem list.  Review of Systems Constitutional: positive for chills, fatigue and fevers Eyes: negative Ears, nose, mouth, throat, and face: positive for earaches, nasal congestion and sore throat Respiratory: positive for cough Cardiovascular: negative Gastrointestinal: negative Musculoskeletal:negative Neurological: positive for headaches   Objective:    BP (!) 150/91   Pulse 75   Temp (!) 97.5 F (36.4 C) (Oral)   Ht 5\' 1"  (1.549 m)   Wt 136 lb (61.7 kg)   BMI 25.70 kg/m  General appearance: alert, cooperative and mild distress Head: Normocephalic, without obvious abnormality, atraumatic Eyes: negative Ears: normal TM and external ear canal left ear and abnormal TM right ear - erythematous and bulging Nose: scant and yellow discharge, mild congestion, turbinates pale, swollen, no sinus tenderness Throat: abnormal findings: mild oropharyngeal erythema Neck: mild anterior cervical adenopathy, no carotid bruit, no JVD, supple, symmetrical, trachea midline and thyroid not enlarged, symmetric, no tenderness/mass/nodules Lungs: clear to auscultation bilaterally Heart: regular rate and rhythm, S1, S2 normal, no murmur, click, rub or gallop Skin: Skin color, texture, turgor normal. No rashes or lesions Neurologic: Grossly normal   Assessment:   Norma Barajas was seen today for  uri.  Diagnoses and all orders for this visit:  URI with cough and congestion Symptomatic care discussed. Increase water intake. Can take Coricidin HBP over the counter.  -     benzonatate (TESSALON PERLES) 100 MG capsule; Take 1 capsule (100 mg total) by mouth 3 (three) times daily as needed for cough.  Non-recurrent acute suppurative otitis media of right ear without spontaneous rupture of tympanic membrane Symptomatic care discussed. Medications as prescribed. Report any new or worsening symptoms.  -     amoxicillin-clavulanate (AUGMENTIN) 875-125 MG tablet; Take 1 tablet by mouth 2 (two) times daily for 10 days.     Plan:    Discussed diagnosis and treatment of URI. Suggested symptomatic OTC remedies. Nasal saline spray for congestion. Augmentin per orders. Follow up as needed. Call in 3 days if symptoms aren't resolving.   Return if symptoms worsen or fail to improve.  The above assessment and management plan was discussed with the patient. The patient verbalized understanding of and has agreed to the management plan. Patient is aware to call the clinic if symptoms fail to improve or worsen. Patient is aware when to return to the clinic for a follow-up visit. Patient educated on when it is appropriate to go to the emergency department.   Monia Pouch, FNP-C [facility] [address] [place], [place] [address] [phone]

## 2019-01-04 NOTE — Patient Instructions (Signed)
It appears that you have a viral upper respiratory infection (cold).  Cold symptoms can last up to 2 weeks.    - Get plenty of rest and drink plenty of fluids. - Try to breathe moist air. Use a cold mist humidifier. - Consume warm fluids (soup or tea) to provide relief for a stuffy nose and to loosen phlegm. - For cough and congestion you can use plain Mucinex, regular or max strength, follow box directions.  - For nasal stuffiness, try saline nasal spray or a Neti Pot. You can use saline nasal spray 4 times daily. Do not use tap water in the Neti Pot, follow instructions on box for proper use. Afrin nasal spray can also be used but this product should not be used longer than 3 days or it will cause rebound nasal stuffiness (worsening nasal congestion). - For sore throat pain relief: use chloraseptic spray, suck on throat lozenges, hard candy or popsicles; gargle with warm salt water (1/4 tsp. salt per 8 oz. of water); and eat soft, bland foods. - For fever or aches and pains take tylenol or motrin as appropriate for age and weight.  - Eat a well-balanced diet. If you cannot, ensure you are getting enough nutrients by taking a daily multivitamin. - Avoid dairy products, as they can thicken phlegm. - Avoid alcohol, as it impairs your body's immune system. - Change your toothbrush in 3 days.   CONTACT YOUR DOCTOR IF YOU EXPERIENCE ANY OF THE FOLLOWING: - High fever - Ear pain - Sinus-type headache - Unusually severe cold symptoms - Cough that gets worse while other cold symptoms improve - Flare up of any chronic lung problem, such as asthma - Your symptoms persist longer than 2 weeks   

## 2019-01-18 ENCOUNTER — Other Ambulatory Visit: Payer: Self-pay | Admitting: *Deleted

## 2019-01-18 MED ORDER — SIMVASTATIN 40 MG PO TABS: 40.0000 mg | ORAL_TABLET | Freq: Every day | ORAL | 0 refills | Status: DC

## 2019-01-26 ENCOUNTER — Other Ambulatory Visit: Payer: Self-pay | Admitting: *Deleted

## 2019-01-26 MED ORDER — SIMVASTATIN 40 MG PO TABS: 40.0000 mg | ORAL_TABLET | Freq: Every day | ORAL | 0 refills | Status: DC

## 2019-01-26 NOTE — Telephone Encounter (Signed)
Per Dr. Warrick Parisian, there is a small interaction with Amlodipine & Simvastatin Pt also tried the Pravastatin for a couple weeks and would rather be on the Simvastatin

## 2019-01-30 ENCOUNTER — Other Ambulatory Visit: Payer: Self-pay | Admitting: Family Medicine

## 2019-02-05 ENCOUNTER — Other Ambulatory Visit: Payer: Self-pay | Admitting: Family Medicine

## 2019-02-21 ENCOUNTER — Other Ambulatory Visit: Payer: Self-pay

## 2019-02-21 ENCOUNTER — Encounter: Payer: Self-pay | Admitting: Pharmacist Clinician (PhC)/ Clinical Pharmacy Specialist

## 2019-02-21 ENCOUNTER — Ambulatory Visit (INDEPENDENT_AMBULATORY_CARE_PROVIDER_SITE_OTHER): Payer: Medicare HMO | Admitting: Pharmacist Clinician (PhC)/ Clinical Pharmacy Specialist

## 2019-02-21 DIAGNOSIS — I4891 Unspecified atrial fibrillation: Secondary | ICD-10-CM | POA: Diagnosis not present

## 2019-02-21 LAB — COAGUCHEK XS/INR WAIVED
INR: 1.8 — ABNORMAL HIGH (ref 0.9–1.1)
Prothrombin Time: 22.8 s

## 2019-02-21 NOTE — Patient Instructions (Signed)
Description   Continue taking warfarin the same way except for tomorrow take a whole pill not a half.  INR was 1.8 today (goal is 2.0 to 3.0)  A little thick today

## 2019-03-27 ENCOUNTER — Other Ambulatory Visit: Payer: Self-pay

## 2019-03-28 ENCOUNTER — Ambulatory Visit (INDEPENDENT_AMBULATORY_CARE_PROVIDER_SITE_OTHER): Payer: Medicare HMO | Admitting: Pharmacist Clinician (PhC)/ Clinical Pharmacy Specialist

## 2019-03-28 ENCOUNTER — Other Ambulatory Visit: Payer: Self-pay | Admitting: Family Medicine

## 2019-03-28 DIAGNOSIS — I482 Chronic atrial fibrillation, unspecified: Secondary | ICD-10-CM

## 2019-03-28 LAB — COAGUCHEK XS/INR WAIVED
INR: 2 — ABNORMAL HIGH (ref 0.9–1.1)
Prothrombin Time: 24 s

## 2019-03-28 NOTE — Patient Instructions (Signed)
Description   Continue taking warfarin the same way.  INR was 2.0 today (goal is 2.0 to 3.0)  [place]

## 2019-04-09 ENCOUNTER — Ambulatory Visit (INDEPENDENT_AMBULATORY_CARE_PROVIDER_SITE_OTHER): Payer: Medicare HMO | Admitting: Physician Assistant

## 2019-04-09 ENCOUNTER — Other Ambulatory Visit: Payer: Self-pay

## 2019-04-09 DIAGNOSIS — R197 Diarrhea, unspecified: Secondary | ICD-10-CM | POA: Diagnosis not present

## 2019-04-10 ENCOUNTER — Ambulatory Visit: Payer: Medicare HMO | Admitting: Gastroenterology

## 2019-04-13 ENCOUNTER — Encounter: Payer: Self-pay | Admitting: Physician Assistant

## 2019-04-13 NOTE — Progress Notes (Signed)
Telephone visit  Subjective: [id number] PCP: Dettinger, Fransisca Kaufmann, MD VZC:HYIF Norma Barajas is a 78 y.o. female calls for telephone consult today. Patient provides verbal consent for consult held via phone.  Patient is identified with 2 separate identifiers.  At this time the entire area is on COVID-19 social distancing and stay home orders are in place.  Patient is of higher risk and therefore we are performing this by a virtual method.  Location of patient: home Location of provider: HOME Others present for call: no  Patient is for problem visit of ongoing diarrhea.  She has had this off and on for many years.  Specifically in the last few days she has had several bowel movements per day.  She has tried nothing over-the-counter.  She is trying to eat well.  She denies any nausea vomiting.  She denies any fever or chills.  In reviewing her history I can see no history of irritable bowel syndrome.  She does have GERD.  She states that it is fairly well controlled.  I reviewed all of her medications.  At this time I have encouraged her to get some Imodium over-the-counter and to take 1 up to 4 times a day as needed for severe diarrhea.  She states that also she will have it after she eats her large meal in the evening.  I have asked her to even try taking 1 before her evening meal to see if this can calm the diarrhea down.   ROS: Per HPI  Allergies  Allergen Reactions  . Codeine Nausea And Vomiting  . Triamterene-Hctz Other (See Comments)    weakness  . Atorvastatin Other (See Comments)    Myalgias   . Crestor [Rosuvastatin Calcium] Other (See Comments)    weakness  . Morphine Nausea Only  . Risedronate Sodium Other (See Comments)    ACTONEL REACTION: reflux   Past Medical History:  Diagnosis Date  . Anxiety   . Arthritis    "right leg" (04/11/2015)  . Cataract   . Coronary atherosclerosis of native coronary artery    a. Nonobstructive minimal CAD 10/2005.  Marland Kitchen Depression   .  Diastolic dysfunction    Grade 1. Ejection fraction 60-65%.  Marland Kitchen Dysrhythmia    a fib  . Erosive esophagitis   . Essential hypertension   . GERD (gastroesophageal reflux disease)   . History of hiatal hernia   . Hypercholesterolemia   . Migraine    "used to have them right bad; I don't now" (04/11/2015)  . Osteoporosis   . Paroxysmal atrial fibrillation (HCC)   . PONV (postoperative nausea and vomiting)   . Vertigo     Current Outpatient Medications:  .  ALPRAZolam (XANAX) 0.25 MG tablet, TAKE  (1)  TABLET TWICE A DAY., Disp: 60 tablet, Rfl: 2 .  amLODipine (NORVASC) 10 MG tablet, TAKE 1 TABLET EVERY DAY, Disp: 90 tablet, Rfl: 1 .  benzonatate (TESSALON PERLES) 100 MG capsule, Take 1 capsule (100 mg total) by mouth 3 (three) times daily as needed for cough., Disp: 20 capsule, Rfl: 0 .  Calcium Carbonate-Vitamin D (CALCIUM-VITAMIN D3 PO), Take 1 tablet by mouth daily., Disp: , Rfl:  .  metoprolol tartrate (LOPRESSOR) 25 MG tablet, TAKE 1/2 TABLET TWICE DAILY, Disp: 90 tablet, Rfl: 3 .  Multiple Vitamin (MULTIVITAMIN) capsule, Take 1 capsule by mouth every evening., Disp: , Rfl:  .  omeprazole (PRILOSEC) 20 MG capsule, Take 1 capsule (20 mg total) by mouth daily.,  Disp: 90 capsule, Rfl: 3 .  simvastatin (ZOCOR) 40 MG tablet, Take 1 tablet (40 mg total) by mouth daily., Disp: 90 tablet, Rfl: 0 .  sotalol (BETAPACE) 80 MG tablet, TAKE 1 TABLET EVERY DAY, Disp: 90 tablet, Rfl: 0 .  warfarin (COUMADIN) 3 MG tablet, TAKE 1/2 TO 1 TABLET EVERY DAY AS DIRECTED  BY  ANTICOAGULATION  CLINIC, Disp: 90 tablet, Rfl: 1  Assessment/ Plan: 78 y.o. female   1. Diarrhea, unspecified type Continue conservative diet Imodium up to 4 times a day Call back if no improvement in symptoms   Start time: 3:50 PM End time: 4:05 PM  No orders of the defined types were placed in this encounter.   Particia Nearing PA-C [place] [phone]

## 2019-05-02 ENCOUNTER — Other Ambulatory Visit: Payer: Self-pay

## 2019-05-02 ENCOUNTER — Ambulatory Visit (INDEPENDENT_AMBULATORY_CARE_PROVIDER_SITE_OTHER): Payer: Medicare HMO | Admitting: Pharmacist Clinician (PhC)/ Clinical Pharmacy Specialist

## 2019-05-02 ENCOUNTER — Encounter: Payer: Self-pay | Admitting: Pharmacist Clinician (PhC)/ Clinical Pharmacy Specialist

## 2019-05-02 DIAGNOSIS — I482 Chronic atrial fibrillation, unspecified: Secondary | ICD-10-CM | POA: Diagnosis not present

## 2019-05-02 LAB — COAGUCHEK XS/INR WAIVED
INR: 2 — ABNORMAL HIGH (ref 0.9–1.1)
Prothrombin Time: 24.6 s

## 2019-05-02 NOTE — Patient Instructions (Signed)
Description   Continue taking warfarin the same way.  INR was 2.0 today (goal is 2.0 to 3.0)  [place]

## 2019-05-03 ENCOUNTER — Ambulatory Visit (INDEPENDENT_AMBULATORY_CARE_PROVIDER_SITE_OTHER): Payer: Medicare HMO | Admitting: Family Medicine

## 2019-05-03 VITALS — BP 155/83 | HR 78 | Temp 97.9°F | Ht 61.0 in | Wt 130.0 lb

## 2019-05-03 DIAGNOSIS — F410 Panic disorder [episodic paroxysmal anxiety] without agoraphobia: Secondary | ICD-10-CM | POA: Diagnosis not present

## 2019-05-03 DIAGNOSIS — F411 Generalized anxiety disorder: Secondary | ICD-10-CM

## 2019-05-03 DIAGNOSIS — F329 Major depressive disorder, single episode, unspecified: Secondary | ICD-10-CM | POA: Diagnosis not present

## 2019-05-03 DIAGNOSIS — F32A Depression, unspecified: Secondary | ICD-10-CM

## 2019-05-03 MED ORDER — MIRTAZAPINE 15 MG PO TABS: 15.0000 mg | ORAL_TABLET | Freq: Every day | ORAL | 1 refills | Status: DC

## 2019-05-03 NOTE — Patient Instructions (Signed)
   For the xanax: take 1/2 tablet every other day for 2 weeks then you can stop taking that medication.  We are starting Mirtazapine for anxiety and depression.  Take 1 tablet at bedtime.  Follow up in 4 weeks with Dr Warrick Parisian.

## 2019-05-03 NOTE — Progress Notes (Signed)
Subjective: CC: Depression PCP: Dettinger, Fransisca Kaufmann, MD Norma Barajas is a 78 y.o. female presenting to clinic today for:  1.  Depression/anxiety Patient reports several month history of depression anxiety.  She is been treated for anxiety with alprazolam in the past.  She currently has cut herself back to half a tablet every day because she was not feeling that it was really helping.  Denies any history of hospitalization or previous treatment for depression.  No suicide attempts.  She does have thoughts of being better off dead or not on the earth but has never had a plan for any suicide.  She denies any knowledge of any anxiety or depression in her family.  She does report a recent falling out with her brother but her depressive and anxiety symptoms preceded this.  She notes she becomes easily upset with the slightest thing.  She does feel like she has good support and relationships with her husband and son.  She talks to her husband and son every day but does not often discuss her mental health/mood issues with them because she does not want them to know about everything.  She would be interested in counseling at some point but is not quite ready to take this step.   ROS: Per HPI  Allergies  Allergen Reactions  . Codeine Nausea And Vomiting  . Triamterene-Hctz Other (See Comments)    weakness  . Atorvastatin Other (See Comments)    Myalgias   . Crestor [Rosuvastatin Calcium] Other (See Comments)    weakness  . Morphine Nausea Only  . Risedronate Sodium Other (See Comments)    ACTONEL REACTION: reflux   Past Medical History:  Diagnosis Date  . Anxiety   . Arthritis    "right leg" (04/11/2015)  . Cataract   . Coronary atherosclerosis of native coronary artery    a. Nonobstructive minimal CAD 10/2005.  Marland Kitchen Depression   . Diastolic dysfunction    Grade 1. Ejection fraction 60-65%.  Marland Kitchen Dysrhythmia    a fib  . Erosive esophagitis   . Essential hypertension   . GERD  (gastroesophageal reflux disease)   . History of hiatal hernia   . Hypercholesterolemia   . Migraine    "used to have them right bad; I don't now" (04/11/2015)  . Osteoporosis   . Paroxysmal atrial fibrillation (HCC)   . PONV (postoperative nausea and vomiting)   . Vertigo     Current Outpatient Medications:  .  ALPRAZolam (XANAX) 0.25 MG tablet, TAKE  (1)  TABLET TWICE A DAY., Disp: 60 tablet, Rfl: 2 .  amLODipine (NORVASC) 10 MG tablet, TAKE 1 TABLET EVERY DAY, Disp: 90 tablet, Rfl: 1 .  Calcium Carbonate-Vitamin D (CALCIUM-VITAMIN D3 PO), Take 1 tablet by mouth daily., Disp: , Rfl:  .  metoprolol tartrate (LOPRESSOR) 25 MG tablet, TAKE 1/2 TABLET TWICE DAILY, Disp: 90 tablet, Rfl: 3 .  Multiple Vitamin (MULTIVITAMIN) capsule, Take 1 capsule by mouth every evening., Disp: , Rfl:  .  omeprazole (PRILOSEC) 20 MG capsule, Take 1 capsule (20 mg total) by mouth daily., Disp: 90 capsule, Rfl: 3 .  simvastatin (ZOCOR) 40 MG tablet, Take 1 tablet (40 mg total) by mouth daily., Disp: 90 tablet, Rfl: 0 .  sotalol (BETAPACE) 80 MG tablet, TAKE 1 TABLET EVERY DAY, Disp: 90 tablet, Rfl: 0 .  warfarin (COUMADIN) 3 MG tablet, TAKE 1/2 TO 1 TABLET EVERY DAY AS DIRECTED  BY  ANTICOAGULATION  CLINIC, Disp: 90 tablet, Rfl: 1  Social History   Socioeconomic History  . Marital status: Married    Spouse name: george   . Number of children: 1  . Years of education: Not on file  . Highest education level: Not on file  Occupational History  . Occupation: Retired    Comment: Education officer, community  . Financial resource strain: Not on file  . Food insecurity    Worry: Not on file    Inability: Not on file  . Transportation needs    Medical: Not on file    Non-medical: Not on file  Tobacco Use  . Smoking status: Never Smoker  . Smokeless tobacco: Never Used  Substance and Sexual Activity  . Alcohol use: No    Alcohol/week: 0.0 standard drinks  . Drug use: No  . Sexual activity: Not Currently     Birth control/protection: Surgical, Post-menopausal    Comment: hyst  Lifestyle  . Physical activity    Days per week: Not on file    Minutes per session: Not on file  . Stress: Not on file  Relationships  . Social Herbalist on phone: Not on file    Gets together: Not on file    Attends religious service: Not on file    Active member of club or organization: Not on file    Attends meetings of clubs or organizations: Not on file    Relationship status: Not on file  . Intimate partner violence    Fear of current or ex partner: Not on file    Emotionally abused: Not on file    Physically abused: Not on file    Forced sexual activity: Not on file  Other Topics Concern  . Not on file  Social History Narrative   Married   No regular exercise   Family History  Problem Relation Age of Onset  . Colon cancer Mother        Diagnosed at age 42  . Heart disease Mother   . Osteoporosis Mother   . Hip fracture Mother   . Stroke Sister   . Diabetes Sister   . Osteoporosis Sister   . Arthritis Sister   . Cancer Sister        uterine  . Stroke Sister   . Heart disease Father   . Hyperlipidemia Brother   . Hypertension Brother   . Heart disease Brother   . Stroke Brother   . Heart disease Sister   . Dementia Sister   . Diabetes Son   . Stroke Son   . Heart attack Neg Hx     Objective: Office vital signs reviewed. BP (!) 155/83   Pulse 78   Temp 97.9 F (36.6 C) (Oral)   Ht 5\' 1"  (1.549 m)   Wt 130 lb (59 kg)   BMI 24.56 kg/m   Physical Examination:  General: Awake, alert, well nourished, No acute distress Cardio: regular rate and rhythm, S1S2 heard, no murmurs appreciated Pulm: clear to auscultation bilaterally, no wheezes, rhonchi or rales; normal work of breathing on room air Psych: Tearful, mood depressed, pleasant and interactive.  Does not appear to be responding to internal stimuli. Depression screen [facility] 2/9 05/03/2019 01/04/2019 12/11/2018  Decreased  Interest 3 0 0  Down, Depressed, Hopeless 3 0 0  PHQ - 2 Score 6 0 0  Altered sleeping 3 - -  Tired, decreased energy 3 - -  Change in appetite 1 - -  Feeling bad or failure  about yourself  1 - -  Trouble concentrating 1 - -  Moving slowly or fidgety/restless 0 - -  Suicidal thoughts 1 - -  PHQ-9 Score 16 - -  Difficult doing work/chores Somewhat difficult - -  Some recent data might be hidden   GAD 7 : Generalized Anxiety Score 05/03/2019 04/22/2016  Nervous, Anxious, on Edge 3 1  Control/stop worrying - 1  Worry too much - different things 3 1  Trouble relaxing - 0  Restless 0 0  Easily annoyed or irritable 3 0  Afraid - awful might happen 3 0  Total GAD 7 Score - 3  Anxiety Difficulty Very difficult Not difficult at all    Assessment/ Plan: 78 y.o. female   1. Generalized anxiety disorder with panic attacks Positive anxiety and depression screen.  We did discuss slow taper off of benzodiazepine which she is already been cutting back on.  We will initiate mirtazapine at 15 mg daily.  We discussed nighttime use of this.  She will follow-up in the next 4 to 6 weeks with either myself or her PCP for interval checkup.  I offered her counseling today but patient declined.  She would maybe be been interested in this in the future but is not ready to take this step. - mirtazapine (REMERON) 15 MG tablet; Take 1 tablet (15 mg total) by mouth at bedtime.  Dispense: 30 tablet; Refill: 1  2. Depressive disorder - mirtazapine (REMERON) 15 MG tablet; Take 1 tablet (15 mg total) by mouth at bedtime.  Dispense: 30 tablet; Refill: 1   No orders of the defined types were placed in this encounter.  No orders of the defined types were placed in this encounter.    Janora Norlander, DO [place] [phone]

## 2019-05-04 ENCOUNTER — Telehealth: Payer: Self-pay | Admitting: Family Medicine

## 2019-05-04 NOTE — Telephone Encounter (Signed)
She can try to decrease 1/2 tablet (7.5 mg) and see if she can tolerate this better

## 2019-05-04 NOTE — Telephone Encounter (Signed)
Pt notified of recommendation verbalizes understnading

## 2019-05-04 NOTE — Telephone Encounter (Signed)
Should she just stop taking or can we lower the dose?

## 2019-05-23 ENCOUNTER — Other Ambulatory Visit: Payer: Self-pay | Admitting: Cardiovascular Disease

## 2019-05-23 ENCOUNTER — Telehealth: Payer: Self-pay | Admitting: Family Medicine

## 2019-05-23 ENCOUNTER — Other Ambulatory Visit: Payer: Self-pay | Admitting: Family Medicine

## 2019-05-23 NOTE — Chronic Care Management (AMB) (Signed)
Chronic Care Management   Note  05/23/2019 Name: Norma Barajas MRN: [id number] DOB: 01-21-1941  [name] is a 78 y.o. year old female who is a primary care patient of Dettinger, Fransisca Kaufmann, MD. I reached out to [name] by phone today in response to a referral sent by Ms. Doree Barthel Howerter's health plan.    Ms. Dunn was given information about Chronic Care Management services today including:  1. CCM service includes personalized support from designated clinical staff supervised by her physician, including individualized plan of care and coordination with other care providers 2. 24/7 contact phone numbers for assistance for urgent and routine care needs. 3. Service will only be billed when office clinical staff spend 20 minutes or more in a month to coordinate care. 4. Only one practitioner may furnish and bill the service in a calendar month. 5. The patient may stop CCM services at any time (effective at the end of the month) by phone call to the office staff. 6. The patient will be responsible for cost sharing (co-pay) of up to 20% of the service fee (after annual deductible is met).  Patient did not agree to services and wishes to consider information provided before deciding about enrollment in care management services.   Follow up plan: The care management team is available to follow up with the patient after provider conversation with the patient regarding recommendation for care management engagement and subsequent re-referral to the care management team.   [place]  [email or web address]   ??[phone]

## 2019-05-23 NOTE — Telephone Encounter (Signed)
Patient states they already called her back.

## 2019-06-01 ENCOUNTER — Telehealth: Payer: Self-pay | Admitting: Family Medicine

## 2019-06-01 ENCOUNTER — Ambulatory Visit: Payer: Medicare HMO | Admitting: Family Medicine

## 2019-06-01 NOTE — Telephone Encounter (Signed)
Apt scheduled with [place] for INR & est care - patient wanted to switch providers due to full schedule.

## 2019-06-06 ENCOUNTER — Other Ambulatory Visit: Payer: Self-pay | Admitting: Family Medicine

## 2019-06-06 ENCOUNTER — Other Ambulatory Visit: Payer: Self-pay

## 2019-06-06 ENCOUNTER — Other Ambulatory Visit: Payer: Medicare HMO

## 2019-06-06 DIAGNOSIS — I4891 Unspecified atrial fibrillation: Secondary | ICD-10-CM

## 2019-06-06 DIAGNOSIS — I10 Essential (primary) hypertension: Secondary | ICD-10-CM

## 2019-06-06 DIAGNOSIS — F411 Generalized anxiety disorder: Secondary | ICD-10-CM

## 2019-06-06 DIAGNOSIS — K219 Gastro-esophageal reflux disease without esophagitis: Secondary | ICD-10-CM

## 2019-06-07 ENCOUNTER — Other Ambulatory Visit: Payer: Self-pay | Admitting: Family Medicine

## 2019-06-07 DIAGNOSIS — E7800 Pure hypercholesterolemia, unspecified: Secondary | ICD-10-CM | POA: Diagnosis not present

## 2019-06-07 DIAGNOSIS — Z0100 Encounter for examination of eyes and vision without abnormal findings: Secondary | ICD-10-CM | POA: Diagnosis not present

## 2019-06-07 DIAGNOSIS — H5210 Myopia, unspecified eye: Secondary | ICD-10-CM | POA: Diagnosis not present

## 2019-06-07 DIAGNOSIS — Z961 Presence of intraocular lens: Secondary | ICD-10-CM | POA: Diagnosis not present

## 2019-06-07 DIAGNOSIS — I10 Essential (primary) hypertension: Secondary | ICD-10-CM | POA: Diagnosis not present

## 2019-06-07 LAB — CMP14+EGFR
ALT: 18 IU/L (ref 0–32)
AST: 25 IU/L (ref 0–40)
Albumin/Globulin Ratio: 1.6 (ref 1.2–2.2)
Albumin: 4.6 g/dL (ref 3.7–4.7)
Alkaline Phosphatase: 77 IU/L (ref 39–117)
BUN/Creatinine Ratio: 11 — ABNORMAL LOW (ref 12–28)
BUN: 10 mg/dL (ref 8–27)
Bilirubin Total: 0.4 mg/dL (ref 0.0–1.2)
CO2: 24 mmol/L (ref 20–29)
Calcium: 9 mg/dL (ref 8.7–10.3)
Chloride: 102 mmol/L (ref 96–106)
Creatinine, Ser: 0.87 mg/dL (ref 0.57–1.00)
GFR calc Af Amer: 73 mL/min/{1.73_m2} (ref >59)
GFR calc non Af Amer: 63 mL/min/{1.73_m2} (ref >59)
Globulin, Total: 2.9 g/dL (ref 1.5–4.5)
Glucose: 101 mg/dL — ABNORMAL HIGH (ref 65–99)
Potassium: 3.9 mmol/L (ref 3.5–5.2)
Sodium: 143 mmol/L (ref 134–144)
Total Protein: 7.5 g/dL (ref 6.0–8.5)

## 2019-06-07 NOTE — Telephone Encounter (Signed)
Patient aware.  Has upcoming appt with [name], FNP to check inr and est care.  Patient states she will discuss with her at the appt

## 2019-06-07 NOTE — Telephone Encounter (Signed)
Alprazolam is a controlled substance, she needs to focus in a visit.  If she wanted to try something in the meantime, have her go ahead and double up on her mirtazapine and take 30 mg.

## 2019-06-07 NOTE — Telephone Encounter (Signed)
What is the name of the medication? Alprazolam 0.25 mg. Patient states that she is not taking the mirtazapine 15 mg. She said it didn't do anything for her.   Have you contacted your pharmacy to request a refill? Yes  Which pharmacy would you like this sent to? [facility]   Patient notified that their request is being sent to the clinical staff for review and that they should receive a call once it is complete. If they do not receive a call within 24 hours they can check with their pharmacy or our office.

## 2019-06-08 ENCOUNTER — Ambulatory Visit: Payer: Medicare HMO | Admitting: Family Medicine

## 2019-06-11 ENCOUNTER — Ambulatory Visit: Payer: Medicare HMO | Admitting: Family Medicine

## 2019-06-11 NOTE — Progress Notes (Signed)
Assessment & Plan:  1. Chronic atrial fibrillation Description   Continue taking warfarin the same way.  INR was 2.4 today (goal is 2.0 to 3.0). Next INR in 4 weeks.     - CoaguChek XS/INR Waived; Standing - CoaguChek XS/INR Waived  2. Difficulty sleeping - Discussed alternatives to Xanax to help with sleep. Patient is not willing to give anything a try and just wants to continue with Xanax. Discussed that I would not be prescribing Xanax for sleep but that I am happy to help her wean off and find something that works for her. She is very adamant that she is going to continue Xanax and states she will follow-up with her PCP (Dr. Warrick Parisian).  3. Encounter to establish care - Patient was down to establish care with me from Dr. Warrick Parisian, after our visit today she has decided to stay with him.    Return in about 4 weeks (around 07/11/2019) for INR, also needs appointment with Dr. Warrick Parisian as PCP.  [name], MSN, APRN, FNP-C [facility]  Subjective:    Patient ID: Norma Barajas, female    DOB: [birth year], 78 y.o.   MRN: [id number]  Patient Care Team: Loman Brooklyn, FNP as PCP - General (Family Medicine) Minus Breeding, MD as PCP - Cardiology (Cardiology) [name], MD as Attending Physician (Gastroenterology) Melina Schools, OD as Consulting Physician (Optometry)   Chief Complaint:  protime recheck and Establish Care   HPI: Norma Barajas is a 78 y.o. female presenting on 06/13/2019 for protime recheck and Establish Care  Patient here for INR due to atrial fibrillation. INR goal 2-3.  Her last INR was 2.0 on 05/02/2019. She was continued on Warfarin 1.5 mg on Sunday, Tuesday, and Thursday with 3 mg on Monday, Wednesday, Friday, and Saturday.   Patient is also requesting Xanax 0.25 mg QHS to help her sleep. She states she did try mirtazipine 15 mg x3 days but she "stayed awake all night". She told the nurse rooming her that when she tried it she felt  groggy the next day. She has not tried any other medications to help her with sleep. States she was put on the mirtazapine at her last visit when she saw Dr. Lajuana Ripple because she had a fight with her brother, but that everything is fine now.      Relevant past medical, surgical, family and social history reviewed and updated as indicated. Interim medical history since our last visit reviewed.  Allergies and medications reviewed and updated.  DATA REVIEWED: CHART IN EPIC  ROS: Negative unless specifically indicated above in HPI.    Current Outpatient Medications:  .  ALPRAZolam (XANAX) 0.25 MG tablet, TAKE  (1)  TABLET TWICE A DAY., Disp: 60 tablet, Rfl: 2 .  Calcium Carbonate-Vitamin D (CALCIUM-VITAMIN D3 PO), Take 1 tablet by mouth daily., Disp: , Rfl:  .  metoprolol tartrate (LOPRESSOR) 25 MG tablet, Take 0.5 tablets (12.5 mg total) by mouth 2 (two) times daily. Please make annual appt with Dr. [name] for future refills. Thank you. 1st attempt., Disp: 90 tablet, Rfl: 0 .  Multiple Vitamin (MULTIVITAMIN) capsule, Take 1 capsule by mouth every evening., Disp: , Rfl:  .  omeprazole (PRILOSEC) 20 MG capsule, Take 1 capsule (20 mg total) by mouth daily., Disp: 90 capsule, Rfl: 3 .  simvastatin (ZOCOR) 40 MG tablet, TAKE 1 TABLET EVERY DAY, Disp: 90 tablet, Rfl: 0 .  sotalol (BETAPACE) 80 MG tablet, TAKE 1 TABLET  EVERY DAY, Disp: 90 tablet, Rfl: 0 .  warfarin (COUMADIN) 3 MG tablet, TAKE 1/2 TO 1 TABLET EVERY DAY AS DIRECTED  BY  ANTICOAGULATION  CLINIC, Disp: 90 tablet, Rfl: 1 .  amLODipine (NORVASC) 10 MG tablet, Take 1 tablet (10 mg total) by mouth daily., Disp: 90 tablet, Rfl: 1  Allergies  Allergen Reactions  . Codeine Nausea And Vomiting  . Triamterene-Hctz Other (See Comments)    weakness  . Atorvastatin Other (See Comments)    Myalgias   . Crestor [Rosuvastatin Calcium] Other (See Comments)    weakness  . Morphine Nausea Only  . Risedronate Sodium Other (See Comments)     ACTONEL REACTION: reflux   Past Medical History:  Diagnosis Date  . Anxiety   . Arthritis    "right leg" (04/11/2015)  . Cataract   . Coronary atherosclerosis of native coronary artery    a. Nonobstructive minimal CAD 10/2005.  Marland Kitchen Depression   . Diastolic dysfunction    Grade 1. Ejection fraction 60-65%.  Marland Kitchen Dysrhythmia    a fib  . Erosive esophagitis   . Essential hypertension   . GERD (gastroesophageal reflux disease)   . History of hiatal hernia   . Hypercholesterolemia   . Migraine    "used to have them right bad; I don't now" (04/11/2015)  . Osteoporosis   . Paroxysmal atrial fibrillation (HCC)   . PONV (postoperative nausea and vomiting)   . Vertigo     Past Surgical History:  Procedure Laterality Date  . BRAVO [place] STUDY  11/15/2012   Procedure: BRAVO [place];  Surgeon: [name], MD;  [facility];  Service: Endoscopy;;  . CATARACT EXTRACTION W/ INTRAOCULAR LENS  IMPLANT, BILATERAL Bilateral   . COLONOSCOPY  2008   Dr. [name]: internal hemorrhoids   . DILATION AND CURETTAGE OF UTERUS    . ESOPHAGOGASTRODUODENOSCOPY (EGD) WITH ESOPHAGEAL DILATION  2001   Dr. [name]: erosive esophagitis, esophageal stricture, duodenitis, s/p Savary dilation  . ESOPHAGOGASTRODUODENOSCOPY (EGD) WITH ESOPHAGEAL DILATION  11/15/2012   [id number] web was found & MOST LIKELY CAUSE FOR DYAPHAGIA/Polyp was found in the gastric body and gastric fundus/ gastritis on bx  . EYE SURGERY Bilateral    "laser OR after cataract OR; cause I couldn't see"  . FLEXIBLE SIGMOIDOSCOPY N/A 08/22/2014   Procedure: FLEXIBLE SIGMOIDOSCOPY;  Surgeon: [name], MD;  [facility];  Service: Endoscopy;  Laterality: N/A;  830  . FRACTURE SURGERY Right    below the knee - 2 bones broke has plates  . HEMORRHOID BANDING N/A 08/22/2014   Procedure: HEMORRHOID BANDING;  Surgeon: [name], MD;  [facility];  Service: Endoscopy;  Laterality: N/A;  . HEMORRHOID SURGERY N/A  03/30/2018   Procedure: EXTENSIVE HEMORRHOIDECTOMY;  Surgeon: [name], MD;  Location: [facility];  Service: General;  Laterality: N/A;  . OPEN REDUCTION INTERNAL FIXATION (ORIF) TIBIA/FIBULA FRACTURE Right 2013   broke tibia and fibula after falling down stairs  . PROLAPSED UTERINE FIBROID LIGATION  2015  . TOTAL ABDOMINAL HYSTERECTOMY      Social History   Socioeconomic History  . Marital status: Married    Spouse name: george   . Number of children: 1  . Years of education: Not on file  . Highest education level: Not on file  Occupational History  . Occupation: Retired    Comment: Education officer, community  . Financial resource strain: Not on file  . Food insecurity  Worry: Not on file    Inability: Not on file  . Transportation needs    Medical: Not on file    Non-medical: Not on file  Tobacco Use  . Smoking status: Never Smoker  . Smokeless tobacco: Never Used  Substance and Sexual Activity  . Alcohol use: No    Alcohol/week: 0.0 standard drinks  . Drug use: No  . Sexual activity: Not Currently    Birth control/protection: Surgical, Post-menopausal    Comment: hyst  Lifestyle  . Physical activity    Days per week: Not on file    Minutes per session: Not on file  . Stress: Not on file  Relationships  . Social Herbalist on phone: Not on file    Gets together: Not on file    Attends religious service: Not on file    Active member of club or organization: Not on file    Attends meetings of clubs or organizations: Not on file    Relationship status: Not on file  . Intimate partner violence    Fear of current or ex partner: Not on file    Emotionally abused: Not on file    Physically abused: Not on file    Forced sexual activity: Not on file  Other Topics Concern  . Not on file  Social History Narrative   Married   No regular exercise        Objective:    BP (!) 147/83   Pulse 60   Temp (!) 96.9 F (36.1 C) (Temporal)   Ht 5\' 1"  (1.549  m)   Wt 134 lb (60.8 kg)   BMI 25.32 kg/m    Physical Exam Vitals signs reviewed.  Constitutional:      General: She is not in acute distress.    Appearance: Normal appearance. She is normal weight. She is not ill-appearing, toxic-appearing or diaphoretic.  HENT:     Head: Normocephalic and atraumatic.  Eyes:     General: No scleral icterus.       Right eye: No discharge.        Left eye: No discharge.     Conjunctiva/sclera: Conjunctivae normal.  Neck:     Musculoskeletal: Normal range of motion.  Cardiovascular:     Rate and Rhythm: Normal rate. Rhythm regularly irregular.     Heart sounds: Murmur present. Systolic murmur present with a grade of 2/6. No friction rub. No gallop.   Pulmonary:     Effort: Pulmonary effort is normal. No respiratory distress.     Breath sounds: Normal breath sounds. No stridor. No wheezing, rhonchi or rales.  Musculoskeletal: Normal range of motion.  Skin:    General: Skin is warm and dry.     Capillary Refill: Capillary refill takes less than 2 seconds.  Neurological:     General: No focal deficit present.     Mental Status: She is alert and oriented to person, place, and time. Mental status is at baseline.  Psychiatric:        Mood and Affect: Mood normal.        Behavior: Behavior normal.        Thought Content: Thought content normal.        Judgment: Judgment normal.     Comments: Very difficult to talk with patient as just about every time I asked her a question her response was "Give me some Xanax".      Lab Results  Component Value Date  TSH 1.483 04/10/2015   Lab Results  Component Value Date   WBC 4.8 12/04/2018   HGB 14.2 12/04/2018   HCT 42.4 12/04/2018   MCV 91 12/04/2018   PLT 199 12/04/2018   Lab Results  Component Value Date   NA 143 06/06/2019   K 3.9 06/06/2019   CO2 24 06/06/2019   GLUCOSE 101 (H) 06/06/2019   BUN 10 06/06/2019   CREATININE 0.87 06/06/2019   BILITOT 0.4 06/06/2019   ALKPHOS 77 06/06/2019    AST 25 06/06/2019   ALT 18 06/06/2019   PROT 7.5 06/06/2019   ALBUMIN 4.6 06/06/2019   CALCIUM 9.0 06/06/2019   ANIONGAP 9 03/27/2018   Lab Results  Component Value Date   CHOL 171 12/04/2018   Lab Results  Component Value Date   HDL 37 (L) 12/04/2018   Lab Results  Component Value Date   LDLCALC 73 12/04/2018   Lab Results  Component Value Date   TRIG 307 (H) 12/04/2018   Lab Results  Component Value Date   CHOLHDL 4.6 (H) 12/04/2018   Lab Results  Component Value Date   HGBA1C 5.6 02/28/2017

## 2019-06-13 ENCOUNTER — Ambulatory Visit (INDEPENDENT_AMBULATORY_CARE_PROVIDER_SITE_OTHER): Payer: Medicare HMO | Admitting: Family Medicine

## 2019-06-13 ENCOUNTER — Encounter: Payer: Self-pay | Admitting: Family Medicine

## 2019-06-13 ENCOUNTER — Other Ambulatory Visit: Payer: Self-pay | Admitting: *Deleted

## 2019-06-13 ENCOUNTER — Ambulatory Visit: Payer: Medicare HMO | Admitting: Family Medicine

## 2019-06-13 ENCOUNTER — Other Ambulatory Visit: Payer: Self-pay

## 2019-06-13 ENCOUNTER — Telehealth: Payer: Self-pay | Admitting: Family Medicine

## 2019-06-13 VITALS — BP 147/83 | HR 60 | Temp 96.9°F | Ht 61.0 in | Wt 134.0 lb

## 2019-06-13 DIAGNOSIS — G479 Sleep disorder, unspecified: Secondary | ICD-10-CM

## 2019-06-13 DIAGNOSIS — I482 Chronic atrial fibrillation, unspecified: Secondary | ICD-10-CM | POA: Diagnosis not present

## 2019-06-13 DIAGNOSIS — Z7689 Persons encountering health services in other specified circumstances: Secondary | ICD-10-CM | POA: Diagnosis not present

## 2019-06-13 LAB — COAGUCHEK XS/INR WAIVED
INR: 2.4 — ABNORMAL HIGH (ref 0.9–1.1)
Prothrombin Time: 28.7 s

## 2019-06-13 MED ORDER — AMLODIPINE BESYLATE 10 MG PO TABS: 10.0000 mg | ORAL_TABLET | Freq: Every day | ORAL | 1 refills | Status: DC

## 2019-06-13 NOTE — Telephone Encounter (Signed)
FYI

## 2019-06-13 NOTE — Telephone Encounter (Signed)
Spoke with Norma Barajas today and she would like to stay with [name] as her PCP.  If you can help her get off of the Xanax, she is willing to try something different to help her sleep.  Wants to keep her appointment with you August 14th.

## 2019-06-20 NOTE — Progress Notes (Signed)
Assessment & Plan:  1. Difficulty sleeping - D/C'ing Xanax; patient has been weaning herself down by taking 1/2 tablet QHS.  - traZODone (DESYREL) 50 MG tablet; Take 1 tablet (50 mg total) by mouth at bedtime as needed for sleep.  Dispense: 30 tablet; Refill: 2  2. Loose bowel movements - Discussed that we do not want to get in a habit of taking something to stop bowels from moving and then taking a laxative to make them move. She is going to try Florastor once daily. Education provided on chronic diarrhea.  - saccharomyces boulardii (FLORASTOR) 250 MG capsule; Take 1 capsule (250 mg total) by mouth daily.  Dispense: 30 capsule; Refill: 2  3. History of abnormal mammogram - Patient does not wish to have further mammograms. She understands the risk of breast cancer.   4. Pulmonary nodule - Patient does not wish to have further CT scans to monitor. She understands this could progress to lung cancer.    Return as scheduled for INR, for also f/u on sleep.  [name], MSN, APRN, FNP-C [facility]  Subjective:    Patient ID: Norma Barajas, female    DOB: 1941/03/15, 78 y.o.   MRN: [id number]  Patient Care Team: Loman Brooklyn, FNP as PCP - General (Family Medicine) Minus Breeding, MD as PCP - Cardiology (Cardiology) [name], MD as Attending Physician (Gastroenterology) Melina Schools, OD as Consulting Physician (Optometry)   Chief Complaint:  Discuss Medication and Diarrhea (since 2015)   HPI: Norma Barajas is a 78 y.o. female presenting on 06/22/2019 for Discuss Medication and Diarrhea (since 2015)  Patient was seen on 06/13/2019 at which time she had no interest in other medications to help with sleep. She wanted to remain on Xanax and had no intention of following up with me since I was not agreeable to prescribing it. She presents today to discuss alternatives. She has previously failed therapy with Remeron. Today she is agreeable to trying something  else.  Patient reports when she eats dinner (a big meal) a hour later she gets diarrhea. She typically has diarrhea twice after dinner but has had it up to five times. When asked to describe she states it is just looser, not like water. This has been going on for years but she feels like it is getting worse lately. She tried taking her husbands [employer] three days this week but it caused her to have constipation, so then yesterday she took MOM and then had diarrhea multiple times.    Relevant past medical, surgical, family and social history reviewed and updated as indicated. Interim medical history since our last visit reviewed.  Allergies and medications reviewed and updated.  DATA REVIEWED: CHART IN EPIC  ROS: Negative unless specifically indicated above in HPI.    Current Outpatient Medications:  .  amLODipine (NORVASC) 10 MG tablet, Take 1 tablet (10 mg total) by mouth daily., Disp: 90 tablet, Rfl: 1 .  Calcium Carbonate-Vitamin D (CALCIUM-VITAMIN D3 PO), Take 1 tablet by mouth daily., Disp: , Rfl:  .  metoprolol tartrate (LOPRESSOR) 25 MG tablet, Take 0.5 tablets (12.5 mg total) by mouth 2 (two) times daily., Disp: 45 tablet, Rfl: 1 .  Multiple Vitamin (MULTIVITAMIN) capsule, Take 1 capsule by mouth every evening., Disp: , Rfl:  .  omeprazole (PRILOSEC) 20 MG capsule, Take 1 capsule (20 mg total) by mouth daily., Disp: 90 capsule, Rfl: 3 .  simvastatin (ZOCOR) 40 MG tablet, TAKE  1 TABLET EVERY DAY, Disp: 90 tablet, Rfl: 0 .  sotalol (BETAPACE) 80 MG tablet, TAKE 1 TABLET EVERY DAY, Disp: 90 tablet, Rfl: 0 .  warfarin (COUMADIN) 3 MG tablet, TAKE 1/2 TO 1 TABLET EVERY DAY AS DIRECTED  BY  ANTICOAGULATION  CLINIC, Disp: 90 tablet, Rfl: 1 .  saccharomyces boulardii (FLORASTOR) 250 MG capsule, Take 1 capsule (250 mg total) by mouth daily., Disp: 30 capsule, Rfl: 2 .  traZODone (DESYREL) 50 MG tablet, Take 1 tablet (50 mg total) by mouth at bedtime as needed for sleep., Disp: 30  tablet, Rfl: 2  Allergies  Allergen Reactions  . Codeine Nausea And Vomiting  . Triamterene-Hctz Other (See Comments)    weakness  . Atorvastatin Other (See Comments)    Myalgias   . Crestor [Rosuvastatin Calcium] Other (See Comments)    weakness  . Morphine Nausea Only  . Risedronate Sodium Other (See Comments)    ACTONEL REACTION: reflux   Past Medical History:  Diagnosis Date  . 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 Anxiety   . Arthritis    "right leg" (04/11/2015)  . BPPV (benign paroxysmal positional vertigo) 03/23/2016  . Cataract   . Coronary atherosclerosis of native coronary artery    a. Nonobstructive minimal CAD 10/2005.  Marland Kitchen Depression   . Diastolic dysfunction    Grade 1. Ejection fraction 60-65%.  Marland Kitchen Dysrhythmia    a fib  . Erosive esophagitis   . Essential hypertension   . GERD (gastroesophageal reflux disease)   . Grade III hemorrhoids   . History of hiatal hernia   . Hypercholesterolemia   . Internal hemorrhoids with complication 06/16/9832   OCT 2015 FLEX SIG/IH BANDING    . Migraine    "used to have them right bad; I don't now" (04/11/2015)  . MITRAL REGURGITATION 04/27/2010   Qualifier: Diagnosis of  By: [name], MD, [name]   . Osteoporosis   . Paroxysmal atrial fibrillation (HCC)   . PONV (postoperative nausea and vomiting)   . Vertigo     Past Surgical History:  Procedure Laterality Date  . BRAVO [place] STUDY  11/15/2012   Procedure: BRAVO [place];  Surgeon: [name], MD;  [facility];  Service: Endoscopy;;  . CATARACT EXTRACTION W/ INTRAOCULAR LENS  IMPLANT, BILATERAL Bilateral   . COLONOSCOPY  2008   Dr. [name]: internal hemorrhoids   . DILATION AND CURETTAGE OF UTERUS    . ESOPHAGOGASTRODUODENOSCOPY (EGD) WITH ESOPHAGEAL DILATION  2001   Dr. [name]: erosive esophagitis, esophageal stricture, duodenitis, s/p Savary dilation  . ESOPHAGOGASTRODUODENOSCOPY (EGD) WITH ESOPHAGEAL DILATION  11/15/2012    ASN:[id number] web was found & MOST LIKELY CAUSE FOR DYAPHAGIA/Polyp was found in the gastric body and gastric fundus/ gastritis on bx  . EYE SURGERY Bilateral    "laser OR after cataract OR; cause I couldn't see"  . FLEXIBLE SIGMOIDOSCOPY N/A 08/22/2014   Procedure: FLEXIBLE SIGMOIDOSCOPY;  Surgeon: [name], MD;  [facility];  Service: Endoscopy;  Laterality: N/A;  830  . FRACTURE SURGERY Right    below the knee - 2 bones broke has plates  . HEMORRHOID BANDING N/A 08/22/2014   Procedure: HEMORRHOID BANDING;  Surgeon: [name], MD;  [facility];  Service: Endoscopy;  Laterality: N/A;  . HEMORRHOID SURGERY N/A 03/30/2018   Procedure: EXTENSIVE HEMORRHOIDECTOMY;  Surgeon: [name], MD;  Location: [facility];  Service: General;  Laterality: N/A;  .  OPEN REDUCTION INTERNAL FIXATION (ORIF) TIBIA/FIBULA FRACTURE Right 2013   broke tibia and fibula after falling down stairs  . PROLAPSED UTERINE FIBROID LIGATION  2015  . TOTAL ABDOMINAL HYSTERECTOMY      Social History   Socioeconomic History  . Marital status: Married    Spouse name: george   . Number of children: 1  . Years of education: Not on file  . Highest education level: Not on file  Occupational History  . Occupation: Retired    Comment: Education officer, community  . Financial resource strain: Not on file  . Food insecurity    Worry: Not on file    Inability: Not on file  . Transportation needs    Medical: Not on file    Non-medical: Not on file  Tobacco Use  . Smoking status: Never Smoker  . Smokeless tobacco: Never Used  Substance and Sexual Activity  . Alcohol use: No    Alcohol/week: 0.0 standard drinks  . Drug use: No  . Sexual activity: Not Currently    Birth control/protection: Surgical, Post-menopausal    Comment: hyst  Lifestyle  . Physical activity    Days per week: Not on file    Minutes per session: Not on file  . Stress: Not on file  Relationships  . Social  Herbalist on phone: Not on file    Gets together: Not on file    Attends religious service: Not on file    Active member of club or organization: Not on file    Attends meetings of clubs or organizations: Not on file    Relationship status: Not on file  . Intimate partner violence    Fear of current or ex partner: Not on file    Emotionally abused: Not on file    Physically abused: Not on file    Forced sexual activity: Not on file  Other Topics Concern  . Not on file  Social History Narrative   Married   No regular exercise        Objective:    BP 137/71   Pulse 69   Temp (!) 97.1 F (36.2 C) (Temporal)   Ht 5\' 1"  (1.549 m)   Wt 131 lb 12.8 oz (59.8 kg)   BMI 24.90 kg/m    Physical Exam Vitals signs reviewed.  Constitutional:      General: She is not in acute distress.    Appearance: Normal appearance. She is not ill-appearing, toxic-appearing or diaphoretic.  HENT:     Head: Normocephalic and atraumatic.  Eyes:     General: No scleral icterus.       Right eye: No discharge.        Left eye: No discharge.     Conjunctiva/sclera: Conjunctivae normal.  Neck:     Musculoskeletal: Normal range of motion.  Cardiovascular:     Rate and Rhythm: Normal rate and regular rhythm.     Heart sounds: Murmur present. Systolic murmur present with a grade of 2/6. No friction rub. No gallop.   Pulmonary:     Effort: Pulmonary effort is normal. No respiratory distress.     Breath sounds: Normal breath sounds. No stridor. No wheezing, rhonchi or rales.  Musculoskeletal: Normal range of motion.  Skin:    General: Skin is warm and dry.     Capillary Refill: Capillary refill takes less than 2 seconds.  Neurological:     General: No focal deficit present.  Mental Status: She is alert and oriented to person, place, and time. Mental status is at baseline.  Psychiatric:        Mood and Affect: Mood normal.        Behavior: Behavior normal.        Thought Content:  Thought content normal.        Judgment: Judgment normal.     Lab Results  Component Value Date   TSH 1.483 04/10/2015   Lab Results  Component Value Date   WBC 4.8 12/04/2018   HGB 14.2 12/04/2018   HCT 42.4 12/04/2018   MCV 91 12/04/2018   PLT 199 12/04/2018   Lab Results  Component Value Date   NA 143 06/06/2019   K 3.9 06/06/2019   CO2 24 06/06/2019   GLUCOSE 101 (H) 06/06/2019   BUN 10 06/06/2019   CREATININE 0.87 06/06/2019   BILITOT 0.4 06/06/2019   ALKPHOS 77 06/06/2019   AST 25 06/06/2019   ALT 18 06/06/2019   PROT 7.5 06/06/2019   ALBUMIN 4.6 06/06/2019   CALCIUM 9.0 06/06/2019   ANIONGAP 9 03/27/2018   Lab Results  Component Value Date   CHOL 171 12/04/2018   Lab Results  Component Value Date   HDL 37 (L) 12/04/2018   Lab Results  Component Value Date   LDLCALC 73 12/04/2018   Lab Results  Component Value Date   TRIG 307 (H) 12/04/2018   Lab Results  Component Value Date   CHOLHDL 4.6 (H) 12/04/2018   Lab Results  Component Value Date   HGBA1C 5.6 02/28/2017

## 2019-06-21 ENCOUNTER — Other Ambulatory Visit: Payer: Self-pay

## 2019-06-22 ENCOUNTER — Other Ambulatory Visit: Payer: Self-pay

## 2019-06-22 ENCOUNTER — Encounter: Payer: Self-pay | Admitting: Family Medicine

## 2019-06-22 ENCOUNTER — Ambulatory Visit (INDEPENDENT_AMBULATORY_CARE_PROVIDER_SITE_OTHER): Payer: Medicare HMO | Admitting: Family Medicine

## 2019-06-22 VITALS — BP 137/71 | HR 69 | Temp 97.1°F | Ht 61.0 in | Wt 131.8 lb

## 2019-06-22 DIAGNOSIS — R195 Other fecal abnormalities: Secondary | ICD-10-CM

## 2019-06-22 DIAGNOSIS — G479 Sleep disorder, unspecified: Secondary | ICD-10-CM | POA: Diagnosis not present

## 2019-06-22 DIAGNOSIS — Z87898 Personal history of other specified conditions: Secondary | ICD-10-CM

## 2019-06-22 DIAGNOSIS — R911 Solitary pulmonary nodule: Secondary | ICD-10-CM | POA: Diagnosis not present

## 2019-06-22 MED ORDER — METOPROLOL TARTRATE 25 MG PO TABS: 12.5000 mg | ORAL_TABLET | Freq: Two times a day (BID) | ORAL | 1 refills | Status: DC

## 2019-06-22 MED ORDER — TRAZODONE HCL 50 MG PO TABS: 50.0000 mg | ORAL_TABLET | Freq: Every evening | ORAL | 2 refills | Status: DC | PRN

## 2019-06-22 MED ORDER — SACCHAROMYCES BOULARDII 250 MG PO CAPS: 250.0000 mg | ORAL_CAPSULE | Freq: Every day | ORAL | 2 refills | Status: DC

## 2019-06-22 NOTE — Patient Instructions (Signed)
Chronic Diarrhea Diarrhea is a condition in which a person passes frequent loose and watery stools. It can cause you to feel weak and dehydrated. Dehydration can make you tired and thirsty. It can also cause a dry mouth, decreased urination, and dark yellow urine. Diarrhea is a sign of another underlying problem, such as:  Infection.  Medication side effects.  Dietary intolerance, such as lactose intolerance.  Conditions such as celiac disease, irritable bowel syndrome (IBS), or inflammatory bowel disease (IBD). In most cases, diarrhea lasts 2-3 days. Diarrhea that lasts longer than 4 weeks is called long-lasting (chronic) diarrhea. It is important that you treat your diarrhea as told by your health care provider. Follow these instructions at home: Follow these recommendations as told by your health care provider. Eating and drinking  Take an oral rehydration solution (ORS). This is a drink that is designed to keep you hydrated. It can be found at pharmacies and retail stores.  Drink clear fluids, such as water, ice chips, diluted fruit juice, and low-calorie sports drinks.  Follow the diet recommended by your health care provider. You may need to avoid foods that trigger diarrhea for you.  Avoid foods and beverages that contain a lot of sugar or caffeine.  Avoid alcohol.  Avoid spicy or fatty foods. General instructions      Drink enough fluid to keep your urine clear or pale yellow.  Wash your hands often and after each diarrhea episode. If soap and water are not available, use hand sanitizer.  Make sure that all people in your household wash their hands well and often.  Take over-the-counter and prescription medicines only as told by your health care provider.  If you were prescribed an antibiotic medicine, take it as told by your health care provider. Do not stop taking the antibiotic even if you start to feel better.  Rest at home while you recover.  Watch your  condition for any changes.  Take a warm bath to relieve any burning or pain from frequent diarrhea episodes.  Keep all follow-up visits as told by your health care provider. This is important. Contact a health care provider if:  You have a fever.  Your diarrhea gets worse or does not get better.  You have new symptoms.  You cannot drink fluids without vomiting.  You feel light-headed or dizzy.  You have a headache.  You have muscle cramps.  You have severe pain in the rectum. Get help right away if:  You have persistent vomiting.  You have chest pain.  You feel extremely weak or you faint.  You have bloody or black stools, or stools that look like tar.  You have severe pain, cramping, or bloating in your abdomen, or pain that stays in one place.  You have trouble breathing or you are breathing very quickly.  Your heart is beating very quickly.  Your skin feels cold and clammy.  You feel confused.  You have a severe headache.  You have signs of dehydration, such as: ? Dark urine, very little urine, or no urine. ? Cracked lips. ? Dry mouth. ? Sunken eyes. ? Sleepiness. ? Weakness. Summary  Chronic diarrhea is a condition in which a person passes frequent loose and watery stools for more than 4 weeks.  Diarrhea is a sign of another underlying problem.  Drink enough fluid to keep your urine clear or pale yellow to avoid dehydration.  Wash your hands often and after each diarrhea episode. If soap and water are   not available, use hand sanitizer.  It is important that you treat your diarrhea as told by your health care provider. This information is not intended to replace advice given to you by your health care provider. Make sure you discuss any questions you have with your health care provider. Document Released: 01/15/2004 Document Revised: 01/20/2018 Document Reviewed: 09/13/2016 Elsevier Patient Education  2020 [employer].

## 2019-06-26 ENCOUNTER — Telehealth: Payer: Self-pay | Admitting: Family Medicine

## 2019-06-26 NOTE — Telephone Encounter (Signed)
I reached out to patient personally due to her declining to be seen and evaluated for chest pain. She repeatedly tells me the chest pain has nothing to do with her heart, that she thinks it is inner ear. I explained to her that inner ear problems would not cause chest pain. She states she has not had the chest pain in two days. We discussed the dangers of missing something going on with her heart. She reports she has an appointment with her cardiologist next month and she absolutely isn't going anywhere until then. Also while on the phone she tells me that the Trazodone isn't doing anything for her sleep. She is not willing to increase the dose and reports she just isn't going to take it. She is not interested in discussing alternatives at this time.

## 2019-06-26 NOTE — Telephone Encounter (Signed)
BP Reading 111/61 this morning.  Chest pains 2 days after taking florastor

## 2019-06-26 NOTE — Telephone Encounter (Signed)
Blood pressure is not low, but normal. She can continue to monitor but I would continue Trazodone. Florastor would not cause chest pains. If she is having chest pain she needs to be evaluated ASAP; Due to cardiac complaints with chest pain she should go to the ER.

## 2019-06-26 NOTE — Telephone Encounter (Signed)
Patient aware and states that she is no longer having chest pain, but is feeling dizzy and staggering around.  Advised patient she will need to be seen or go to the emergency room.  Patient declined.

## 2019-07-10 ENCOUNTER — Ambulatory Visit: Payer: Medicare HMO | Admitting: Nurse Practitioner

## 2019-07-10 ENCOUNTER — Encounter (HOSPITAL_COMMUNITY): Payer: Self-pay

## 2019-07-10 ENCOUNTER — Emergency Department (HOSPITAL_COMMUNITY)
Admission: EM | Admit: 2019-07-10 | Discharge: 2019-07-10 | Disposition: A | Discharge status: Home / Self Care | Payer: Medicare HMO | Attending: Emergency Medicine | Admitting: Emergency Medicine

## 2019-07-10 ENCOUNTER — Emergency Department (HOSPITAL_COMMUNITY): Payer: Medicare HMO

## 2019-07-10 ENCOUNTER — Other Ambulatory Visit: Payer: Self-pay

## 2019-07-10 ENCOUNTER — Telehealth: Payer: Self-pay | Admitting: Family Medicine

## 2019-07-10 DIAGNOSIS — Z7901 Long term (current) use of anticoagulants: Secondary | ICD-10-CM | POA: Diagnosis not present

## 2019-07-10 DIAGNOSIS — K7689 Other specified diseases of liver: Secondary | ICD-10-CM | POA: Diagnosis not present

## 2019-07-10 DIAGNOSIS — R109 Unspecified abdominal pain: Secondary | ICD-10-CM | POA: Diagnosis present

## 2019-07-10 DIAGNOSIS — I10 Essential (primary) hypertension: Secondary | ICD-10-CM | POA: Diagnosis not present

## 2019-07-10 DIAGNOSIS — R1084 Generalized abdominal pain: Secondary | ICD-10-CM

## 2019-07-10 DIAGNOSIS — D3501 Benign neoplasm of right adrenal gland: Secondary | ICD-10-CM | POA: Diagnosis not present

## 2019-07-10 DIAGNOSIS — I4891 Unspecified atrial fibrillation: Secondary | ICD-10-CM | POA: Insufficient documentation

## 2019-07-10 DIAGNOSIS — Z79899 Other long term (current) drug therapy: Secondary | ICD-10-CM | POA: Insufficient documentation

## 2019-07-10 LAB — CBC WITH DIFFERENTIAL/PLATELET
Abs Immature Granulocytes: 0.02 10*3/uL (ref 0.00–0.07)
Basophils Absolute: 0.1 10*3/uL (ref 0.0–0.1)
Basophils Relative: 1 %
Eosinophils Absolute: 0.1 10*3/uL (ref 0.0–0.5)
Eosinophils Relative: 1 %
HCT: 46.9 % — ABNORMAL HIGH (ref 36.0–46.0)
Hemoglobin: 16.1 g/dL — ABNORMAL HIGH (ref 12.0–15.0)
Immature Granulocytes: 0 %
Lymphocytes Relative: 23 %
Lymphs Abs: 1.7 10*3/uL (ref 0.7–4.0)
MCH: 31.6 pg (ref 26.0–34.0)
MCHC: 34.3 g/dL (ref 30.0–36.0)
MCV: 92 fL (ref 80.0–100.0)
Monocytes Absolute: 0.8 10*3/uL (ref 0.1–1.0)
Monocytes Relative: 11 %
Neutro Abs: 4.9 10*3/uL (ref 1.7–7.7)
Neutrophils Relative %: 64 %
Platelets: 201 10*3/uL (ref 150–400)
RBC: 5.1 MIL/uL (ref 3.87–5.11)
RDW: 13.1 % (ref 11.5–15.5)
WBC: 7.5 10*3/uL (ref 4.0–10.5)
nRBC: 0 % (ref 0.0–0.2)

## 2019-07-10 LAB — URINALYSIS, ROUTINE W REFLEX MICROSCOPIC
Bilirubin Urine: NEGATIVE
Glucose, UA: NEGATIVE mg/dL
Hgb urine dipstick: NEGATIVE
Ketones, ur: NEGATIVE mg/dL
Leukocytes,Ua: NEGATIVE
Nitrite: NEGATIVE
Protein, ur: NEGATIVE mg/dL
Specific Gravity, Urine: 1.003 — ABNORMAL LOW (ref 1.005–1.030)
pH: 8 (ref 5.0–8.0)

## 2019-07-10 LAB — COMPREHENSIVE METABOLIC PANEL
ALT: 23 U/L (ref 0–44)
AST: 31 U/L (ref 15–41)
Albumin: 4.5 g/dL (ref 3.5–5.0)
Alkaline Phosphatase: 74 U/L (ref 38–126)
Anion gap: 13 (ref 5–15)
BUN: 11 mg/dL (ref 8–23)
CO2: 27 mmol/L (ref 22–32)
Calcium: 9.3 mg/dL (ref 8.9–10.3)
Chloride: 99 mmol/L (ref 98–111)
Creatinine, Ser: 0.78 mg/dL (ref 0.44–1.00)
GFR calc Af Amer: >60 mL/min (ref >60)
GFR calc non Af Amer: >60 mL/min (ref >60)
Glucose, Bld: 137 mg/dL — ABNORMAL HIGH (ref 70–99)
Potassium: 3.5 mmol/L (ref 3.5–5.1)
Sodium: 139 mmol/L (ref 135–145)
Total Bilirubin: 0.6 mg/dL (ref 0.3–1.2)
Total Protein: 8.3 g/dL — ABNORMAL HIGH (ref 6.5–8.1)

## 2019-07-10 LAB — PROTIME-INR
INR: 1.9 — ABNORMAL HIGH (ref 0.8–1.2)
Prothrombin Time: 21.1 seconds — ABNORMAL HIGH (ref 11.4–15.2)

## 2019-07-10 LAB — LIPASE, BLOOD: Lipase: 41 U/L (ref 11–51)

## 2019-07-10 MED ORDER — IOHEXOL 300 MG/ML  SOLN
100.0000 mL | Freq: Once | INTRAMUSCULAR | Status: AC | PRN
  Administered 2019-07-10: 14:00:00 100 mL via INTRAVENOUS

## 2019-07-10 MED ORDER — DICYCLOMINE HCL 20 MG PO TABS: 20.0000 mg | ORAL_TABLET | Freq: Two times a day (BID) | ORAL | 0 refills | Status: DC

## 2019-07-10 MED ORDER — FENTANYL CITRATE (PF) 100 MCG/2ML IJ SOLN
50.0000 ug | Freq: Once | INTRAMUSCULAR | Status: AC
  Administered 2019-07-10: 11:00:00 50 ug via INTRAVENOUS
  Filled 2019-07-10: qty 2

## 2019-07-10 NOTE — ED Notes (Signed)
Pt returned from CT °

## 2019-07-10 NOTE — ED Triage Notes (Signed)
Pt has been having abdominal pain, diarrhea, and bloating for the last few months. Has been seen by her PCP, but has not been given any definitive answers. NAD.

## 2019-07-10 NOTE — Discharge Instructions (Addendum)
Your testing shows no abnormalities on your blood work, your urine sample.  There is no signs of causes of your pain There is a slight growth on 1 of your adrenal glands, this has been seen previously and is not the cause of your pain.  You can follow-up with your doctor about this in the next couple of weeks Try dicyclomine, 20 mg twice daily as needed for the cramping and pain
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
## 2019-07-10 NOTE — ED Notes (Signed)
Patient transported to CT 

## 2019-07-10 NOTE — ED Notes (Signed)
EDP at bedside updating patient and family. 

## 2019-07-11 ENCOUNTER — Ambulatory Visit: Payer: Medicare HMO | Admitting: Family Medicine

## 2019-07-11 LAB — URINE CULTURE: Culture: NO GROWTH

## 2019-07-13 ENCOUNTER — Encounter: Payer: Self-pay | Admitting: Gastroenterology

## 2019-07-13 ENCOUNTER — Ambulatory Visit: Payer: Medicare HMO | Admitting: Gastroenterology

## 2019-07-13 ENCOUNTER — Other Ambulatory Visit: Payer: Self-pay

## 2019-07-13 VITALS — BP 120/76 | HR 65 | Temp 96.6°F | Ht 61.0 in | Wt 127.2 lb

## 2019-07-13 DIAGNOSIS — R195 Other fecal abnormalities: Secondary | ICD-10-CM

## 2019-07-13 MED ORDER — DICYCLOMINE HCL 10 MG PO CAPS: 10.0000 mg | ORAL_CAPSULE | Freq: Three times a day (TID) | ORAL | 3 refills | Status: DC

## 2019-07-13 MED ORDER — HYOSCYAMINE SULFATE 0.125 MG SL SUBL: 0.1250 mg | SUBLINGUAL_TABLET | SUBLINGUAL | 0 refills | Status: DC | PRN

## 2019-07-13 NOTE — Patient Instructions (Addendum)
Stop dicyclomine (Bentyl). I have sent in a different medication called Levsin. You can take this every 4 hours for abdominal cramping. Watch for dizziness, dry mouth, confusion. Stop taking if this happens.  I want you to follow a lactose-free diet for now and keep a stool diary.   Please call with how you are doing around next week. We need to see you in 2-3 weeks to keep a close eye on you and adjust things as needed OR if we need to do more tests.  It was a pleasure to see you today. I want to create trusting relationships with patients to provide genuine, compassionate, and quality care. I value your feedback. If you receive a survey regarding your visit,  I greatly appreciate you taking time to fill this out.   Annitta Needs, PhD, ANP-BC [facility]    Lactose-Free Diet, Adult If you have lactose intolerance, you are not able to digest lactose. Lactose is a natural sugar found mainly in dairy milk and dairy products. You may need to avoid all foods and beverages that contain lactose. A lactose-free diet can help you do this. Which foods have lactose? Lactose is found in dairy milk and dairy products, such as:  Yogurt.  Cheese.  Butter.  Margarine.  Sour cream.  Cream.  Whipped toppings and nondairy creamers.  Ice cream and other dairy-based desserts. Lactose is also found in foods or products made with dairy milk or milk ingredients. To find out whether a food contains dairy milk or a milk ingredient, look at the ingredients list. Avoid foods with the statement "May contain milk" and foods that contain:  Milk powder.  Whey.  Curd.  Caseinate.  Lactose.  Lactalbumin.  Lactoglobulin. What are alternatives to dairy milk and foods made with milk products?  Lactose-free milk.  Soy milk with added calcium and vitamin D.  Almond milk, coconut milk, rice milk, or other nondairy milk alternatives with added calcium and vitamin D. Note that these are  low in protein.  Soy products, such as soy yogurt, soy cheese, soy ice cream, and soy-based sour cream.  Other nut milk products, such as almond yogurt, almond cheese, cashew yogurt, cashew cheese, cashew ice cream, coconut yogurt, and coconut ice cream. What are tips for following this plan?  Do not consume foods, beverages, vitamins, minerals, or medicines containing lactose. Read ingredient lists carefully.  Look for the words "lactose-free" on labels.  Use lactase enzyme drops or tablets as directed by your health care provider.  Use lactose-free milk or a milk alternative, such as soy milk or almond milk, for drinking and cooking.  Make sure you get enough calcium and vitamin D in your diet. A lactose-free eating plan can be lacking in these important nutrients.  Take calcium and vitamin D supplements as directed by your health care provider. Talk to your health care provider about supplements if you are not able to get enough calcium and vitamin D from food. What foods can I eat?  Fruits All fresh, canned, frozen, or dried fruits that are not processed with lactose. Vegetables All fresh, frozen, and canned vegetables without cheese, cream, or butter sauces. Grains Any that are not made with dairy milk or dairy products. Meats and other proteins Any meat, fish, poultry, and other protein sources that are not made with dairy milk or dairy products. Soy cheese and yogurt. Fats and oils Any that are not made with dairy milk or dairy products. Beverages Lactose-free milk. Soy,  rice, or almond milk with added calcium and vitamin D. Fruit and vegetable juices. Sweets and desserts Any that are not made with dairy milk or dairy products. Seasonings and condiments Any that are not made with dairy milk or dairy products. Calcium Calcium is found in many foods that contain lactose and is important for bone health. The amount of calcium you need depends on your age:  Adults younger  than 50 years: 1,000 mg of calcium a day.  Adults older than 50 years: 1,200 mg of calcium a day. If you are not getting enough calcium, you may get it from other sources, including:  Orange juice with calcium added. There are 300-350 mg of calcium in 1 cup of orange juice.  Calcium-fortified soy milk. There are 300-400 mg of calcium in 1 cup of calcium-fortified soy milk.  Calcium-fortified rice or almond milk. There are 300 mg of calcium in 1 cup of calcium-fortified rice or almond milk.  Calcium-fortified breakfast cereals. There are 100-1,000 mg of calcium in calcium-fortified breakfast cereals.  Spinach, cooked. There are 145 mg of calcium in  cup of cooked spinach.  Edamame, cooked. There are 130 mg of calcium in  cup of cooked edamame.  Collard greens, cooked. There are 125 mg of calcium in  cup of cooked collard greens.  Kale, frozen or cooked. There are 90 mg of calcium in  cup of cooked or frozen kale.  Almonds. There are 95 mg of calcium in  cup of almonds.  Broccoli, cooked. There are 60 mg of calcium in 1 cup of cooked broccoli. The items listed above may not be a complete list of recommended foods and beverages. Contact a dietitian for more options. What foods are not recommended? Fruits None, unless they are made with dairy milk or dairy products. Vegetables None, unless they are made with dairy milk or dairy products. Grains Any grains that are made with dairy milk or dairy products. Meats and other proteins None, unless they are made with dairy milk or dairy products. Dairy All dairy products, including milk, goat's milk, buttermilk, kefir, acidophilus milk, flavored milk, evaporated milk, condensed milk, dulce de [place], eggnog, yogurt, cheese, and cheese spreads. Fats and oils Any that are made with milk or milk products. Margarines and salad dressings that contain milk or cheese. Cream. Half and half. Cream cheese. Sour cream. Chip dips made with sour  cream or yogurt. Beverages Hot chocolate. Cocoa with lactose. Instant iced teas. Powdered fruit drinks. Smoothies made with dairy milk or yogurt. Sweets and desserts Any that are made with milk or milk products. Seasonings and condiments Chewing gum that has lactose. Spice blends if they contain lactose. Artificial sweeteners that contain lactose. Nondairy creamers. The items listed above may not be a complete list of foods and beverages to avoid. Contact a dietitian for more information. Summary  If you are lactose intolerant, it means that you have a hard time digesting lactose, a natural sugar found in milk and milk products.  Following a lactose-free diet can help you manage this condition.  Calcium is important for bone health and is found in many foods that contain lactose. Talk with your health care provider about other sources of calcium. This information is not intended to replace advice given to you by your health care provider. Make sure you discuss any questions you have with your health care provider. Document Released: 04/16/2002 Document Revised: 11/22/2017 Document Reviewed: 11/22/2017 Elsevier Patient Education  2020 [employer].

## 2019-07-13 NOTE — Progress Notes (Signed)
Primary Care Physician:  Loman Brooklyn, FNP Primary Gastroenterologist:  Dr. [name]   Chief Complaint  Patient presents with  . Diarrhea    x 1 month, pt went to ED This week and was given Bentyl    HPI:   Norma Barajas is an 78 y.o. female presenting today with diarrhea, last seen in 2015 with hemorrhoid banding at time of flex sig.   CT abd/pelvis WITH contrast in ED a few days ago with stable right adrenal adenoma, stable hepatic cysts, no acute abnormality.   No appetite. Has had reported diarrhea a "long time", at least 6 months, if not longer. Usually has about 2 stools a day. Sometimes 4-5. Starts off solid, then turns to water. Bowels will move good one day then not the next day.  Stomach feels swollen. Feels less bloated after a BM but then better. Abdomen uncomfortable during bowel movement. No rectal bleeding. Bentyl BID since leaving ED. Feels about the same. Soft BM Wednesday, looser stool Thursday and Friday. Usually after eating will have the movement. Prilosec daily. Would have reflux if not taking it. Feels like stool is on the looser side more often. Spicy foods make more frequent stools. Fatty foods make it worse. Cheese makes it worse. Used to eat cereal every morning but now makes her feel sick. No recent antibiotics.   Usually weighs in the mid 130s, now 127. Highest weight Jan 2020 was 136. Stopped Xanax Aug 14th. Not sleeping well at night. Probiotics worsened symptoms. Bentyl has made her dizzy and dry mouth.   Past Medical History:  Diagnosis Date  . 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 Anxiety   . Arthritis    "right leg" (04/11/2015)  . BPPV (benign paroxysmal positional vertigo) 03/23/2016  . Cataract   . Coronary atherosclerosis of native coronary artery    a. Nonobstructive minimal CAD 10/2005.  Marland Kitchen Depression   . Diastolic dysfunction    Grade 1. Ejection fraction 60-65%.  Marland Kitchen Dysrhythmia    a fib  . Erosive esophagitis   . Essential  hypertension   . GERD (gastroesophageal reflux disease)   . Grade III hemorrhoids   . History of hiatal hernia   . Hypercholesterolemia   . Internal hemorrhoids with complication [id number]   OCT 2015 FLEX SIG/IH BANDING    . Migraine    "used to have them right bad; I don't now" (04/11/2015)  . MITRAL REGURGITATION 04/27/2010   Qualifier: Diagnosis of  By: [name], MD, [name]   . Osteoporosis   . Paroxysmal atrial fibrillation (HCC)   . PONV (postoperative nausea and vomiting)   . Vertigo     Past Surgical History:  Procedure Laterality Date  . BRAVO [place] STUDY  11/15/2012   Procedure: BRAVO [place];  Surgeon: [name], MD;  Location: [facility];  Service: Endoscopy;;  . CATARACT EXTRACTION W/ INTRAOCULAR LENS  IMPLANT, BILATERAL Bilateral   . COLONOSCOPY  2008   Dr. [name]: internal hemorrhoids   . DILATION AND CURETTAGE OF UTERUS    . ESOPHAGOGASTRODUODENOSCOPY (EGD) WITH ESOPHAGEAL DILATION  2001   Dr. [name]: erosive esophagitis, esophageal stricture, duodenitis, s/p Savary dilation  . ESOPHAGOGASTRODUODENOSCOPY (EGD) WITH ESOPHAGEAL DILATION  11/15/2012   [id number] web was found & MOST LIKELY CAUSE FOR DYAPHAGIA/Polyp was found in the gastric body and gastric fundus/ gastritis on bx  . EYE SURGERY Bilateral    "laser OR after cataract OR; cause  I couldn't see"  . FLEXIBLE SIGMOIDOSCOPY N/A 08/22/2014   mild diverticulosis in sigmoid, moderate sized Grade 3 hemorrhoids s/p banding X 3.   Marland Kitchen FRACTURE SURGERY Right    below the knee - 2 bones broke has plates  . HEMORRHOID BANDING N/A 08/22/2014   Procedure: HEMORRHOID BANDING;  Surgeon: [name], MD;  Location: [facility];  Service: Endoscopy;  Laterality: N/A;  . HEMORRHOID SURGERY N/A 03/30/2018   Procedure: EXTENSIVE HEMORRHOIDECTOMY;  Surgeon: [name], MD;  Location: [facility] ORS;  Service: General;  Laterality: N/A;  . OPEN REDUCTION INTERNAL FIXATION (ORIF) TIBIA/FIBULA FRACTURE Right 2013    broke tibia and fibula after falling down stairs  . PROLAPSED UTERINE FIBROID LIGATION  2015  . TOTAL ABDOMINAL HYSTERECTOMY      Current Outpatient Medications  Medication Sig Dispense Refill  . amLODipine (NORVASC) 10 MG tablet Take 1 tablet (10 mg total) by mouth daily. 90 tablet 1  . Calcium Carbonate-Vitamin D (CALCIUM-VITAMIN D3 PO) Take 1 tablet by mouth daily.    . metoprolol tartrate (LOPRESSOR) 25 MG tablet Take 0.5 tablets (12.5 mg total) by mouth 2 (two) times daily. 45 tablet 1  . Multiple Vitamin (MULTIVITAMIN) capsule Take 1 capsule by mouth every evening.    Marland Kitchen omeprazole (PRILOSEC) 20 MG capsule Take 1 capsule (20 mg total) by mouth daily. 90 capsule 3  . simvastatin (ZOCOR) 40 MG tablet TAKE 1 TABLET EVERY DAY (Patient taking differently: Take 40 mg by mouth daily at 6 PM. ) 90 tablet 0  . warfarin (COUMADIN) 3 MG tablet TAKE 1/2 TO 1 TABLET EVERY DAY AS DIRECTED  BY  ANTICOAGULATION  CLINIC (Patient taking differently: Take 1.5-3 mg by mouth as directed. Patient takes 1.5mg  on Sunday, Tuesday, and Thursday; all other days, patient takes 3mg ) 90 tablet 1  . hyoscyamine (LEVSIN SL) 0.125 MG SL tablet Place 1 tablet (0.125 mg total) under the tongue every 4 (four) hours as needed. 30 tablet 0   No current facility-administered medications for this visit.     Allergies as of 07/13/2019 - Review Complete 07/13/2019  Allergen Reaction Noted  . Codeine Nausea And Vomiting 03/24/2018  . Triamterene-hctz Other (See Comments) 02/18/2011  . Atorvastatin Other (See Comments) 04/04/2015  . Crestor [rosuvastatin calcium] Other (See Comments) 02/18/2011  . Morphine Nausea Only   . Risedronate sodium Other (See Comments) 02/18/2011    Family History  Problem Relation Age of Onset  . Colon cancer Mother        Diagnosed at age 37  . Heart disease Mother   . Osteoporosis Mother   . Hip fracture Mother   . Stroke Sister   . Diabetes Sister   . Osteoporosis Sister   .  Arthritis Sister   . Cancer Sister        uterine  . Stroke Sister   . Heart disease Father   . Hyperlipidemia Brother   . Hypertension Brother   . Heart disease Brother   . Stroke Brother   . Heart disease Sister   . Dementia Sister   . Diabetes Son   . Stroke Son   . Heart attack Neg Hx     Social History   Socioeconomic History  . Marital status: Married    Spouse name: george   . Number of children: 1  . Years of education: Not on file  . Highest education level: Not on file  Occupational History  . Occupation: Retired  Comment: Textile  Social Needs  . Financial resource strain: Not on file  . Food insecurity    Worry: Not on file    Inability: Not on file  . Transportation needs    Medical: Not on file    Non-medical: Not on file  Tobacco Use  . Smoking status: Never Smoker  . Smokeless tobacco: Never Used  Substance and Sexual Activity  . Alcohol use: No    Alcohol/week: 0.0 standard drinks  . Drug use: No  . Sexual activity: Not Currently    Birth control/protection: Surgical, Post-menopausal    Comment: hyst  Lifestyle  . Physical activity    Days per week: Not on file    Minutes per session: Not on file  . Stress: Not on file  Relationships  . Social Herbalist on phone: Not on file    Gets together: Not on file    Attends religious service: Not on file    Active member of club or organization: Not on file    Attends meetings of clubs or organizations: Not on file    Relationship status: Not on file  . Intimate partner violence    Fear of current or ex partner: Not on file    Emotionally abused: Not on file    Physically abused: Not on file    Forced sexual activity: Not on file  Other Topics Concern  . Not on file  Social History Narrative   Married   No regular exercise    Review of Systems: Gen: see HPI CV: Denies chest pain, heart palpitations, peripheral edema, syncope.  Resp: Denies shortness of breath at rest or  with exertion. Denies wheezing or cough.  GI: see HPI GU : Denies urinary burning, urinary frequency, urinary hesitancy MS: Denies joint pain, muscle weakness, cramps, or limitation of movement.  Derm: Denies rash, itching, dry skin Psych: Denies depression, anxiety, memory loss, and confusion Heme: Denies bruising, bleeding, and enlarged lymph nodes.  Physical Exam: BP 120/76   Pulse 65   Temp (!) 96.6 F (35.9 C) (Oral)   Ht 5\' 1"  (1.549 m)   Wt 127 lb 3.2 oz (57.7 kg)   BMI 24.03 kg/m  General:   Alert and oriented. Pleasant and cooperative. Well-nourished and well-developed.  Head:  Normocephalic and atraumatic. Eyes:  Without icterus, sclera clear and conjunctiva pink.  Ears:  Mild hard of hearing Lungs:  Clear to auscultation bilaterally.  Heart:  S1, S2 present irregularly irregular  Abdomen:  +BS, soft, non-tender and non-distended. No HSM noted. No guarding or rebound. No masses appreciated.  Rectal:  Deferred  Msk:  With kyphosis  Extremities:  Without  edema. Neurologic:  Alert and  oriented x4 Psych:  Alert and cooperative. Normal mood and affect.

## 2019-07-16 NOTE — Assessment & Plan Note (Signed)
78 year old female with over 6 month history of alternating loose and soft BMs, with occasional constipation but mainly frequent stools. No recent antibiotic exposure. With chronicity, doubt infectious process. Last complete colonoscopy in 2008, +FH colon cancer in mother but at age 79. Noticeably worsened symptoms with dairy. CT on file with contrast unrevealing.   Will trial supportive measures first but ultimately may need colonoscopy. Stop bentyl, as this caused dizziness. Trial Levsin sparingly. Lactose-free diet provided. Keep stool diary. Return in 2-3 weeks for close follow-up, weight check. Low threshold for diagnostic colonoscopy.

## 2019-07-17 DIAGNOSIS — Z7901 Long term (current) use of anticoagulants: Secondary | ICD-10-CM | POA: Insufficient documentation

## 2019-07-17 NOTE — Progress Notes (Signed)
Assessment & Plan:  1-2. Chronic atrial fibrillation/Chronic anticoagulation Description   Continue taking warfarin the same way.  INR was 1.9 today (goal is 2.0 to 3.0). Next INR in 4 weeks.     - CoaguChek XS/INR Waived  3. Difficulty sleeping - Patient not willing to try anything else for sleep. Offered referral to a psychiatrist but patient declined as well.   4. Loose bowel movements - Encouraged patient to try recommendations from GI.    Return in about 4 weeks (around 08/15/2019) for INR & follow-up of chronic medication conditions.  [name], MSN, APRN, FNP-C [facility]  Subjective:    Patient ID: Norma Barajas, female    DOB: 10-22-1941, 78 y.o.   MRN: [id number]  Patient Care Team: Loman Brooklyn, FNP as PCP - General (Family Medicine) Minus Breeding, MD as PCP - Cardiology (Cardiology) [name], MD as Attending Physician (Gastroenterology) Melina Schools, OD as Consulting Physician (Optometry)   Chief Complaint:  Chief Complaint  Patient presents with  . protime recheck    HPI: Norma Barajas is a 78 y.o. female presenting on 07/18/2019 for protime recheck  Patient is currently taking warfarin 1.5 mg on Sunday, Tuesday, and Thursday with 3 mg on Monday, Wednesday, Friday, and Saturday due to atrial fibrillation. Her last INR four weeks ago was 2.4. INR goal is 2-3.   She was prescribed Trazodone 50 mg PO QHS three and a half weeks ago to help with sleep as her Xanax was discontinued that she was taking for sleep. She had informed me only four days after prescribing that it was not helpful; she was not willing to increase the dosage or try an alternative. Today she is still not willing to try anything but Xanax.   Patient has been seen by GI since our last visit. She was encouraged to try a lactose-free diet which the patient has not been doing. She is to return in 2-3 weeks for close follow-up and weight check. Patient reports  she may do a colonoscopy.   New complaints: None   Relevant past medical, surgical, family and social history reviewed and updated as indicated. Interim medical history since our last visit reviewed.  Allergies and medications reviewed and updated.  DATA REVIEWED: CHART IN EPIC  ROS: Negative unless specifically indicated above in HPI.    Current Outpatient Medications:  .  amLODipine (NORVASC) 10 MG tablet, Take 1 tablet (10 mg total) by mouth daily., Disp: 90 tablet, Rfl: 1 .  Calcium Carbonate-Vitamin D (CALCIUM-VITAMIN D3 PO), Take 1 tablet by mouth daily., Disp: , Rfl:  .  hyoscyamine (LEVSIN SL) 0.125 MG SL tablet, Place 1 tablet (0.125 mg total) under the tongue every 4 (four) hours as needed., Disp: 30 tablet, Rfl: 0 .  metoprolol tartrate (LOPRESSOR) 25 MG tablet, Take 0.5 tablets (12.5 mg total) by mouth 2 (two) times daily., Disp: 45 tablet, Rfl: 1 .  Multiple Vitamin (MULTIVITAMIN) capsule, Take 1 capsule by mouth every evening., Disp: , Rfl:  .  omeprazole (PRILOSEC) 20 MG capsule, Take 1 capsule (20 mg total) by mouth daily., Disp: 90 capsule, Rfl: 3 .  simvastatin (ZOCOR) 40 MG tablet, TAKE 1 TABLET EVERY DAY (Patient taking differently: Take 40 mg by mouth daily at 6 PM. ), Disp: 90 tablet, Rfl: 0 .  warfarin (COUMADIN) 3 MG tablet, TAKE 1/2 TO 1 TABLET EVERY DAY AS DIRECTED  BY  ANTICOAGULATION  CLINIC (Patient taking differently: Take 1.5-3  mg by mouth as directed. Patient takes 1.5mg  on Sunday, Tuesday, and Thursday; all other days, patient takes 3mg ), Disp: 90 tablet, Rfl: 1  Allergies  Allergen Reactions  . Codeine Nausea And Vomiting  . Triamterene-Hctz Other (See Comments)    weakness  . Atorvastatin Other (See Comments)    Myalgias   . Crestor [Rosuvastatin Calcium] Other (See Comments)    weakness  . Morphine Nausea Only  . Risedronate Sodium Other (See Comments)    ACTONEL REACTION: reflux   Past Medical History:  Diagnosis Date  . 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 Anxiety   . Arthritis    "right leg" (04/11/2015)  . BPPV (benign paroxysmal positional vertigo) 03/23/2016  . Cataract   . Coronary atherosclerosis of native coronary artery    a. Nonobstructive minimal CAD 10/2005.  Marland Kitchen Depression   . Diastolic dysfunction    Grade 1. Ejection fraction 60-65%.  Marland Kitchen Dysrhythmia    a fib  . Erosive esophagitis   . Essential hypertension   . GERD (gastroesophageal reflux disease)   . Grade III hemorrhoids   . History of hiatal hernia   . Hypercholesterolemia   . Internal hemorrhoids with complication 123XX123   OCT 2015 FLEX SIG/IH BANDING    . Migraine    "used to have them right bad; I don't now" (04/11/2015)  . MITRAL REGURGITATION 04/27/2010   Qualifier: Diagnosis of  By: [name], MD, [name]   . Osteoporosis   . Paroxysmal atrial fibrillation (HCC)   . PONV (postoperative nausea and vomiting)   . Vertigo     Past Surgical History:  Procedure Laterality Date  . BRAVO [place] STUDY  11/15/2012   Procedure: BRAVO [place];  Surgeon: [name], MD;  [facility];  Service: Endoscopy;;  . CATARACT EXTRACTION W/ INTRAOCULAR LENS  IMPLANT, BILATERAL Bilateral   . COLONOSCOPY  2008   Dr. [name]: internal hemorrhoids   . DILATION AND CURETTAGE OF UTERUS    . ESOPHAGOGASTRODUODENOSCOPY (EGD) WITH ESOPHAGEAL DILATION  2001   Dr. [name]: erosive esophagitis, esophageal stricture, duodenitis, s/p Savary dilation  . ESOPHAGOGASTRODUODENOSCOPY (EGD) WITH ESOPHAGEAL DILATION  11/15/2012   [id number] web was found & MOST LIKELY CAUSE FOR DYAPHAGIA/Polyp was found in the gastric body and gastric fundus/ gastritis on bx  . EYE SURGERY Bilateral    "laser OR after cataract OR; cause I couldn't see"  . FLEXIBLE SIGMOIDOSCOPY N/A 08/22/2014   mild diverticulosis in sigmoid, moderate sized Grade 3 hemorrhoids s/p banding X 3.   Marland Kitchen FRACTURE SURGERY Right    below the knee - 2 bones broke has plates  .  HEMORRHOID BANDING N/A 08/22/2014   Procedure: HEMORRHOID BANDING;  Surgeon: [name], MD;  [facility];  Service: Endoscopy;  Laterality: N/A;  . HEMORRHOID SURGERY N/A 03/30/2018   Procedure: EXTENSIVE HEMORRHOIDECTOMY;  Surgeon: [name], MD;  Location: [facility] OR[facility];  Service: General;  Laterality: N/A;  . OPEN REDUCTION INTERNAL FIXATION (ORIF) TIBIA/FIBULA FRACTURE Right 2013   broke tibia and fibula after falling down stairs  . PROLAPSED UTERINE FIBROID LIGATION  2015  . TOTAL ABDOMINAL HYSTERECTOMY      Social History   Socioeconomic History  . Marital status: Married    Spouse name: george   . Number of children: 1  . Years of education: Not on file  . Highest education level: Not on file  Occupational History  . Occupation: Retired  Comment: Textile  Social Needs  . Financial resource strain: Not on file  . Food insecurity    Worry: Not on file    Inability: Not on file  . Transportation needs    Medical: Not on file    Non-medical: Not on file  Tobacco Use  . Smoking status: Never Smoker  . Smokeless tobacco: Never Used  Substance and Sexual Activity  . Alcohol use: No    Alcohol/week: 0.0 standard drinks  . Drug use: No  . Sexual activity: Not Currently    Birth control/protection: Surgical, Post-menopausal    Comment: hyst  Lifestyle  . Physical activity    Days per week: Not on file    Minutes per session: Not on file  . Stress: Not on file  Relationships  . Social Herbalist on phone: Not on file    Gets together: Not on file    Attends religious service: Not on file    Active member of club or organization: Not on file    Attends meetings of clubs or organizations: Not on file    Relationship status: Not on file  . Intimate partner violence    Fear of current or ex partner: Not on file    Emotionally abused: Not on file    Physically abused: Not on file    Forced sexual activity: Not on file  Other Topics Concern   . Not on file  Social History Narrative   Married   No regular exercise        Objective:    BP 126/77   Pulse 66   Temp (!) 97.5 F (36.4 C) (Temporal)   Ht 5\' 1"  (1.549 m)   Wt 128 lb 3.2 oz (58.2 kg)   SpO2 97%   BMI 24.22 kg/m   Physical Exam Vitals signs reviewed.  Constitutional:      General: She is not in acute distress.    Appearance: Normal appearance. She is normal weight. She is not ill-appearing, toxic-appearing or diaphoretic.  HENT:     Head: Normocephalic and atraumatic.  Eyes:     General: No scleral icterus.       Right eye: No discharge.        Left eye: No discharge.     Conjunctiva/sclera: Conjunctivae normal.  Neck:     Musculoskeletal: Normal range of motion.  Cardiovascular:     Rate and Rhythm: Normal rate. Rhythm regularly irregular.     Heart sounds: Murmur present. Systolic murmur present with a grade of 2/6. No friction rub. No gallop.   Pulmonary:     Effort: Pulmonary effort is normal. No respiratory distress.     Breath sounds: Normal breath sounds. No stridor. No wheezing, rhonchi or rales.  Musculoskeletal: Normal range of motion.  Skin:    General: Skin is warm and dry.     Capillary Refill: Capillary refill takes less than 2 seconds.  Neurological:     General: No focal deficit present.     Mental Status: She is alert and oriented to person, place, and time. Mental status is at baseline.  Psychiatric:        Mood and Affect: Mood normal.        Behavior: Behavior normal.        Thought Content: Thought content normal.        Judgment: Judgment normal.     Lab Results  Component Value Date   TSH 1.483 04/10/2015  Lab Results  Component Value Date   WBC 7.5 07/10/2019   HGB 16.1 (H) 07/10/2019   HCT 46.9 (H) 07/10/2019   MCV 92.0 07/10/2019   PLT 201 07/10/2019   Lab Results  Component Value Date   NA 139 07/10/2019   K 3.5 07/10/2019   CO2 27 07/10/2019   GLUCOSE 137 (H) 07/10/2019   BUN 11 07/10/2019    CREATININE 0.78 07/10/2019   BILITOT 0.6 07/10/2019   ALKPHOS 74 07/10/2019   AST 31 07/10/2019   ALT 23 07/10/2019   PROT 8.3 (H) 07/10/2019   ALBUMIN 4.5 07/10/2019   CALCIUM 9.3 07/10/2019   ANIONGAP 13 07/10/2019   Lab Results  Component Value Date   CHOL 171 12/04/2018   Lab Results  Component Value Date   HDL 37 (L) 12/04/2018   Lab Results  Component Value Date   LDLCALC 73 12/04/2018   Lab Results  Component Value Date   TRIG 307 (H) 12/04/2018   Lab Results  Component Value Date   CHOLHDL 4.6 (H) 12/04/2018   Lab Results  Component Value Date   HGBA1C 5.6 02/28/2017

## 2019-07-18 ENCOUNTER — Encounter: Payer: Self-pay | Admitting: Family Medicine

## 2019-07-18 ENCOUNTER — Other Ambulatory Visit: Payer: Self-pay

## 2019-07-18 ENCOUNTER — Ambulatory Visit (INDEPENDENT_AMBULATORY_CARE_PROVIDER_SITE_OTHER): Payer: Medicare HMO | Admitting: Family Medicine

## 2019-07-18 VITALS — BP 126/77 | HR 66 | Temp 97.5°F | Ht 61.0 in | Wt 128.2 lb

## 2019-07-18 DIAGNOSIS — I482 Chronic atrial fibrillation, unspecified: Secondary | ICD-10-CM | POA: Diagnosis not present

## 2019-07-18 DIAGNOSIS — G479 Sleep disorder, unspecified: Secondary | ICD-10-CM

## 2019-07-18 DIAGNOSIS — R195 Other fecal abnormalities: Secondary | ICD-10-CM

## 2019-07-18 DIAGNOSIS — Z7901 Long term (current) use of anticoagulants: Secondary | ICD-10-CM | POA: Diagnosis not present

## 2019-07-18 LAB — COAGUCHEK XS/INR WAIVED
INR: 1.9 — ABNORMAL HIGH (ref 0.9–1.1)
Prothrombin Time: 23.3 s

## 2019-07-19 NOTE — Telephone Encounter (Signed)
Patient had appointment with [name] on 07/18/2019

## 2019-07-24 NOTE — Progress Notes (Signed)
Cardiology Office Note   Date:  07/25/2019   ID:  Yatasha, Grado 27-Mar-1941, MRN [id number]  PCP:  Loman Brooklyn, FNP  Cardiologist:   Minus Breeding, MD   No chief complaint on file.      History of Present Illness: Norma Barajas is a 78 y.o. female who presents for follow up of atrial fibrillation.  She has been seen by Dr. [name] and Dr. Curt Bears.  I saw her last year.    She did have an essentially normal echo in 2017.  She was treated with Tikosyn for a while but could not afford this.  She has been treated with Sotalol.    Since I last saw her she has done well.  The patient denies any new symptoms such as chest discomfort, neck or arm discomfort. There has been no new shortness of breath, PND or orthopnea. There have been no reported palpitations, presyncope or syncope.  She has had problems with diarrhea and has seen GI .  This seems to be improved.    Past Medical History:  Diagnosis Date  . Acoustic neuroma ([place]) 02/18/2011   Right ear   . Anxiety   . Arthritis    "right leg" (04/11/2015)  . BPPV (benign paroxysmal positional vertigo) 03/23/2016  . Cataract   . Coronary atherosclerosis of native coronary artery    a. Nonobstructive minimal CAD 10/2005.  Marland Kitchen Depression   . Diastolic dysfunction    Grade 1. Ejection fraction 60-65%.  Marland Kitchen Dysrhythmia    a fib  . Erosive esophagitis   . Essential hypertension   . GERD (gastroesophageal reflux disease)   . Grade III hemorrhoids   . History of hiatal hernia   . Hypercholesterolemia   . Internal hemorrhoids with complication 123XX123   OCT 2015 FLEX SIG/IH BANDING    . Migraine    "used to have them right bad; I don't now" (04/11/2015)  . MITRAL REGURGITATION 04/27/2010   Qualifier: Diagnosis of  By: [name], MD, [name]   . Osteoporosis   . Paroxysmal atrial fibrillation (HCC)   . PONV (postoperative nausea and vomiting)   . Vertigo     Past Surgical History:  Procedure Laterality Date  . BRAVO [place]  STUDY  11/15/2012   Procedure: BRAVO [place];  Surgeon: [name], MD;  [facility];  Service: Endoscopy;;  . CATARACT EXTRACTION W/ INTRAOCULAR LENS  IMPLANT, BILATERAL Bilateral   . COLONOSCOPY  2008   Dr. [name]: internal hemorrhoids   . DILATION AND CURETTAGE OF UTERUS    . ESOPHAGOGASTRODUODENOSCOPY (EGD) WITH ESOPHAGEAL DILATION  2001   Dr. [name]: erosive esophagitis, esophageal stricture, duodenitis, s/p Savary dilation  . ESOPHAGOGASTRODUODENOSCOPY (EGD) WITH ESOPHAGEAL DILATION  11/15/2012   [id number] web was found & MOST LIKELY CAUSE FOR DYAPHAGIA/Polyp was found in the gastric body and gastric fundus/ gastritis on bx  . EYE SURGERY Bilateral    "laser OR after cataract OR; cause I couldn't see"  . FLEXIBLE SIGMOIDOSCOPY N/A 08/22/2014   mild diverticulosis in sigmoid, moderate sized Grade 3 hemorrhoids s/p banding X 3.   Marland Kitchen FRACTURE SURGERY Right    below the knee - 2 bones broke has plates  . HEMORRHOID BANDING N/A 08/22/2014   Procedure: HEMORRHOID BANDING;  Surgeon: [name], MD;  [facility];  Service: Endoscopy;  Laterality: N/A;  . HEMORRHOID SURGERY N/A 03/30/2018   Procedure: EXTENSIVE HEMORRHOIDECTOMY;  Surgeon: [name], MD;  Location: [facility];  Service: General;  Laterality: N/A;  . OPEN REDUCTION INTERNAL FIXATION (ORIF) TIBIA/FIBULA FRACTURE Right 2013   broke tibia and fibula after falling down stairs  . PROLAPSED UTERINE FIBROID LIGATION  2015  . TOTAL ABDOMINAL HYSTERECTOMY       Current Outpatient Medications  Medication Sig Dispense Refill  . amLODipine (NORVASC) 10 MG tablet Take 1 tablet (10 mg total) by mouth daily. 90 tablet 1  . Calcium Carbonate-Vitamin D (CALCIUM-VITAMIN D3 PO) Take 1 tablet by mouth daily.    . metoprolol tartrate (LOPRESSOR) 25 MG tablet Take 0.5 tablets (12.5 mg total) by mouth 2 (two) times daily. 45 tablet 1  . Multiple Vitamin (MULTIVITAMIN) capsule Take 1 capsule by mouth every  evening.    Marland Kitchen omeprazole (PRILOSEC) 20 MG capsule Take 1 capsule (20 mg total) by mouth daily. 90 capsule 3  . simvastatin (ZOCOR) 40 MG tablet TAKE 1 TABLET EVERY DAY (Patient taking differently: Take 40 mg by mouth daily at 6 PM. ) 90 tablet 0  . sotalol (BETAPACE) 80 MG tablet Take 80 mg by mouth daily.    Marland Kitchen warfarin (COUMADIN) 3 MG tablet TAKE 1/2 TO 1 TABLET EVERY DAY AS DIRECTED  BY  ANTICOAGULATION  CLINIC (Patient taking differently: Take 1.5-3 mg by mouth as directed. Patient takes 1.5mg  on Sunday, Tuesday, and Thursday; all other days, patient takes 3mg ) 90 tablet 1  . hyoscyamine (LEVSIN SL) 0.125 MG SL tablet Place 1 tablet (0.125 mg total) under the tongue every 4 (four) hours as needed. (Patient not taking: Reported on 07/25/2019) 30 tablet 0   No current facility-administered medications for this visit.     Allergies:   Codeine, Triamterene-hctz, Atorvastatin, Crestor [rosuvastatin calcium], Morphine, and Risedronate sodium    ROS:  Please see the history of present illness.   Otherwise, review of systems are positive for insomnia.   All other systems are reviewed and negative.    PHYSICAL EXAM: VS:  BP 114/68   Pulse 66   Ht 5\' 1"  (1.549 m)   Wt 128 lb (58.1 kg)   BMI 24.19 kg/m  , BMI Body mass index is 24.19 kg/m. GENERAL:  Well appearing NECK:  No jugular venous distention, waveform within normal limits, carotid upstroke brisk and symmetric, no bruits, no thyromegaly LUNGS:  Clear to auscultation bilaterally CHEST:  Unremarkable HEART:  PMI not displaced or sustained,S1 and S2 within normal limits, no S3, no S4, no clicks, no rubs, no murmurs ABD:  Flat, positive bowel sounds normal in frequency in pitch, no bruits, no rebound, no guarding, no midline pulsatile mass, no hepatomegaly, no splenomegaly EXT:  2 plus pulses throughout, no edema, no cyanosis no clubbing   EKG:  EKG is  ordered today. The ekg ordered today demonstrates sinus rhythm, rate 66 , axis within  normal limits.     Recent Labs: 07/10/2019: ALT 23; BUN 11; Creatinine, Ser 0.78; Hemoglobin 16.1; Platelets 201; Potassium 3.5; Sodium 139    Lipid Panel    Component Value Date/Time   CHOL 171 12/04/2018 1106   CHOL 192 05/15/2013 0829   TRIG 307 (H) 12/04/2018 1106   TRIG 258 (H) 03/19/2014 0812   TRIG 179 (H) 05/15/2013 0829   HDL 37 (L) 12/04/2018 1106   HDL 45 03/19/2014 0812   HDL 44 05/15/2013 0829   CHOLHDL 4.6 (H) 12/04/2018 1106   CHOLHDL 3.9 02/10/2016 0208   VLDL 33 02/10/2016 0208   LDLCALC 73 12/04/2018 1106  LDLCALC 101 (H) 03/19/2014 0812   LDLCALC 112 (H) 05/15/2013 0829      Wt Readings from Last 3 Encounters:  07/25/19 128 lb (58.1 kg)  07/18/19 128 lb 3.2 oz (58.2 kg)  07/13/19 127 lb 3.2 oz (57.7 kg)      Other studies Reviewed: Additional studies/ records that were reviewed today include: Labs Review of the above records demonstrates:  NA   ASSESSMENT AND PLAN:  ATRIAL FIBRILLATION:     She has not had any paroxysms with this.  She tolerates the sotalol.  No change in therapy.   HTN: Her blood pressure is controlled.  No change in therapy.     Current medicines are reviewed at length with the patient today.  The patient does not have concerns regarding medicines.  The following changes have been made:  None Labs/ tests ordered today include:  None  No orders of the defined types were placed in this encounter.    Disposition:   FU with me in 12 months.     Signed, Minus Breeding, MD  07/25/2019 3:00 PM    [place]

## 2019-07-25 ENCOUNTER — Encounter: Payer: Self-pay | Admitting: Cardiology

## 2019-07-25 ENCOUNTER — Ambulatory Visit: Payer: Medicare HMO | Admitting: Cardiology

## 2019-07-25 ENCOUNTER — Other Ambulatory Visit: Payer: Self-pay

## 2019-07-25 VITALS — BP 114/68 | HR 66 | Ht 61.0 in | Wt 128.0 lb

## 2019-07-25 DIAGNOSIS — I10 Essential (primary) hypertension: Secondary | ICD-10-CM | POA: Diagnosis not present

## 2019-07-25 DIAGNOSIS — I480 Paroxysmal atrial fibrillation: Secondary | ICD-10-CM | POA: Diagnosis not present

## 2019-07-25 NOTE — Patient Instructions (Signed)
Medication Instructions:  The current medical regimen is effective;  continue present plan and medications.  If you need a refill on your cardiac medications before your next appointment, please call your pharmacy.   Follow-Up: Follow up in 1 year with Dr. Hochrein.  You will receive a letter in the mail 2 months before you are due.  Please call us when you receive this letter to schedule your follow up appointment.  Thank you for choosing [facility]!!     

## 2019-08-02 ENCOUNTER — Other Ambulatory Visit: Payer: Self-pay | Admitting: Family Medicine

## 2019-08-02 ENCOUNTER — Other Ambulatory Visit: Payer: Self-pay | Admitting: Cardiovascular Disease

## 2019-08-06 ENCOUNTER — Other Ambulatory Visit: Payer: Self-pay | Admitting: Family Medicine

## 2019-08-07 ENCOUNTER — Ambulatory Visit: Payer: Medicare HMO | Admitting: Gastroenterology

## 2019-08-15 ENCOUNTER — Other Ambulatory Visit: Payer: Self-pay

## 2019-08-15 ENCOUNTER — Encounter: Payer: Self-pay | Admitting: Family Medicine

## 2019-08-15 ENCOUNTER — Ambulatory Visit (INDEPENDENT_AMBULATORY_CARE_PROVIDER_SITE_OTHER): Payer: Medicare HMO | Admitting: Family Medicine

## 2019-08-15 VITALS — BP 128/72 | HR 70 | Temp 96.8°F | Ht 61.0 in | Wt 128.4 lb

## 2019-08-15 DIAGNOSIS — Z7901 Long term (current) use of anticoagulants: Secondary | ICD-10-CM

## 2019-08-15 DIAGNOSIS — M810 Age-related osteoporosis without current pathological fracture: Secondary | ICD-10-CM

## 2019-08-15 DIAGNOSIS — I10 Essential (primary) hypertension: Secondary | ICD-10-CM

## 2019-08-15 DIAGNOSIS — E559 Vitamin D deficiency, unspecified: Secondary | ICD-10-CM

## 2019-08-15 DIAGNOSIS — K219 Gastro-esophageal reflux disease without esophagitis: Secondary | ICD-10-CM

## 2019-08-15 DIAGNOSIS — I482 Chronic atrial fibrillation, unspecified: Secondary | ICD-10-CM | POA: Diagnosis not present

## 2019-08-15 DIAGNOSIS — Z23 Encounter for immunization: Secondary | ICD-10-CM

## 2019-08-15 DIAGNOSIS — I2510 Atherosclerotic heart disease of native coronary artery without angina pectoris: Secondary | ICD-10-CM

## 2019-08-15 DIAGNOSIS — R195 Other fecal abnormalities: Secondary | ICD-10-CM | POA: Diagnosis not present

## 2019-08-15 DIAGNOSIS — E782 Mixed hyperlipidemia: Secondary | ICD-10-CM | POA: Diagnosis not present

## 2019-08-15 DIAGNOSIS — G479 Sleep disorder, unspecified: Secondary | ICD-10-CM

## 2019-08-15 LAB — COAGUCHEK XS/INR WAIVED
INR: 2.2 — ABNORMAL HIGH (ref 0.9–1.1)
Prothrombin Time: 26.7 s

## 2019-08-15 MED ORDER — METOPROLOL TARTRATE 25 MG PO TABS: 12.5000 mg | ORAL_TABLET | Freq: Two times a day (BID) | ORAL | 1 refills | Status: DC

## 2019-08-15 MED ORDER — SOTALOL HCL 80 MG PO TABS: 80.0000 mg | ORAL_TABLET | Freq: Every day | ORAL | 1 refills | Status: DC

## 2019-08-15 MED ORDER — OMEPRAZOLE 20 MG PO CPDR: 20.0000 mg | DELAYED_RELEASE_CAPSULE | Freq: Every day | ORAL | 3 refills | Status: DC

## 2019-08-15 NOTE — Progress Notes (Signed)
Assessment & Plan:  1-2. Chronic atrial fibrillation (HCC)/Chronic anticoagulation Description   Continue taking warfarin the same way.  INR was 2.2 today (goal is 2.0 to 3.0). Next INR in 6 weeks.     - CoaguChek XS/INR Waived  3. Age-related osteoporosis without current pathological fracture - Patient does take calcium and vitamin D daily. Last DEXA 07/14/2017 with a t-score of -4.2.  - DG [facility] DEXA  4. Essential hypertension - Well controlled on current regimen. Managed by Dr. Percival Spanish.   5. Mixed hyperlipidemia - Well controlled on current regimen. Most recent lipid panel 12/04/2018.   6. Atherosclerosis of native coronary artery of native heart without angina pectoris - Managed by Dr. Percival Spanish.   7. Gastroesophageal reflux disease, unspecified whether esophagitis present - Well controlled on current regimen.   8. Loose bowel movements - Well controlled on current regimen.   9. Difficulty sleeping - Patient not willing to try any further alternatives for sleep. Failed therapy with remeron and trazodone. Previously declined referral to psychiatrist as she only wishes to have Xanax for sleep.   10. Vitamin D deficiency - Patient takes a vitamin D supplement. All vitamin D levels available for review have been WNL.   11. Need for immunization against influenza - Flu Vaccine QUAD High Dose(Fluad)   Return in about 6 weeks (around 09/26/2019) for INR.  [name], MSN, APRN, FNP-C [facility]  Subjective:    Patient ID: Norma Barajas, female    DOB: Oct 22, 1941, 78 y.o.   MRN: [id number]  Patient Care Team: Loman Brooklyn, FNP as PCP - General (Family Medicine) Minus Breeding, MD as PCP - Cardiology (Cardiology) [name], MD as Attending Physician (Gastroenterology) Melina Schools, OD as Consulting Physician (Optometry)   Chief Complaint:  Chief Complaint  Patient presents with  . protime recheck    HPI: Norma Barajas is a 78  y.o. female presenting on 08/15/2019 for protime recheck  Anticoagulation: Patient here for anticoagulation monitoring. Indication: atrial fibrillation Bleeding Signs/Symptoms:  None Thromboembolic Signs/Symptoms:  None  Missed Coumadin Doses:  None Medication Changes:  no Dietary Changes:  no Bacterial/Viral Infection:  no  Other Concerns:  no   Patient takes amlodipine, metoprolol, and sotalol for hypertension and rate control of atrial fibrillation.   She is taking the levin prescribed by GI for loose stools and reports she is doing well.    New complaints: None   Relevant past medical, surgical, family and social history reviewed and updated as indicated. Interim medical history since our last visit reviewed.  Allergies and medications reviewed and updated.  DATA REVIEWED: CHART IN EPIC  ROS: Negative unless specifically indicated above in HPI.    Current Outpatient Medications:  .  amLODipine (NORVASC) 10 MG tablet, Take 1 tablet (10 mg total) by mouth daily., Disp: 90 tablet, Rfl: 1 .  Calcium Carbonate-Vitamin D (CALCIUM-VITAMIN D3 PO), Take 1 tablet by mouth daily., Disp: , Rfl:  .  cholecalciferol (VITAMIN D3) 25 MCG (1000 UT) tablet, Take 1,000 Units by mouth daily., Disp: , Rfl:  .  hyoscyamine (LEVSIN SL) 0.125 MG SL tablet, Place 1 tablet (0.125 mg total) under the tongue every 4 (four) hours as needed., Disp: 30 tablet, Rfl: 0 .  Multiple Vitamin (MULTIVITAMIN) capsule, Take 1 capsule by mouth every evening., Disp: , Rfl:  .  Omega-3 Fatty Acids (FISH OIL) 1000 MG CAPS, Take 2,000 mg by mouth 2 (two) times daily., Disp: , Rfl:  .  simvastatin (ZOCOR) 40 MG tablet, TAKE 1 TABLET EVERY DAY, Disp: 90 tablet, Rfl: 0 .  warfarin (COUMADIN) 3 MG tablet, TAKE 1/2 TO 1 TABLET EVERY DAY AS DIRECTED  BY  ANTICOAGULATION  CLINIC, Disp: 90 tablet, Rfl: 1 .  metoprolol tartrate (LOPRESSOR) 25 MG tablet, Take 0.5 tablets (12.5 mg total) by mouth 2 (two) times daily., Disp: 90  tablet, Rfl: 1 .  omeprazole (PRILOSEC) 20 MG capsule, Take 1 capsule (20 mg total) by mouth daily., Disp: 90 capsule, Rfl: 3 .  sotalol (BETAPACE) 80 MG tablet, Take 1 tablet (80 mg total) by mouth daily., Disp: 90 tablet, Rfl: 1   Allergies  Allergen Reactions  . Codeine Nausea And Vomiting  . Triamterene-Hctz Other (See Comments)    weakness  . Atorvastatin Other (See Comments)    Myalgias   . Crestor [Rosuvastatin Calcium] Other (See Comments)    weakness  . Morphine Nausea Only  . Risedronate Sodium Other (See Comments)    ACTONEL REACTION: reflux   Past Medical History:  Diagnosis Date  . 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 Anxiety   . Arthritis    "right leg" (04/11/2015)  . BPPV (benign paroxysmal positional vertigo) 03/23/2016  . Cataract   . Coronary atherosclerosis of native coronary artery    a. Nonobstructive minimal CAD 10/2005.  Marland Kitchen Depression   . Diastolic dysfunction    Grade 1. Ejection fraction 60-65%.  Marland Kitchen Dysrhythmia    a fib  . Erosive esophagitis   . Essential hypertension   . GERD (gastroesophageal reflux disease)   . Grade III hemorrhoids   . History of hiatal hernia   . Hypercholesterolemia   . Internal hemorrhoids with complication [id number]   OCT 2015 FLEX SIG/IH BANDING    . Migraine    "used to have them right bad; I don't now" (04/11/2015)  . MITRAL REGURGITATION 04/27/2010   Qualifier: Diagnosis of  By: [name], MD, [name]   . Osteoporosis   . Paroxysmal atrial fibrillation (HCC)   . PONV (postoperative nausea and vomiting)   . Vertigo     Past Surgical History:  Procedure Laterality Date  . BRAVO [place] STUDY  11/15/2012   Procedure: BRAVO [place];  Surgeon: [name], MD;  Location: [facility];  Service: Endoscopy;;  . CATARACT EXTRACTION W/ INTRAOCULAR LENS  IMPLANT, BILATERAL Bilateral   . COLONOSCOPY  2008   Dr. [name]: internal hemorrhoids   . DILATION AND CURETTAGE OF UTERUS    .  ESOPHAGOGASTRODUODENOSCOPY (EGD) WITH ESOPHAGEAL DILATION  2001   Dr. [name]: erosive esophagitis, esophageal stricture, duodenitis, s/p Savary dilation  . ESOPHAGOGASTRODUODENOSCOPY (EGD) WITH ESOPHAGEAL DILATION  11/15/2012   [id number] web was found & MOST LIKELY CAUSE FOR DYAPHAGIA/Polyp was found in the gastric body and gastric fundus/ gastritis on bx  . EYE SURGERY Bilateral    "laser OR after cataract OR; cause I couldn't see"  . FLEXIBLE SIGMOIDOSCOPY N/A 08/22/2014   mild diverticulosis in sigmoid, moderate sized Grade 3 hemorrhoids s/p banding X 3.   Marland Kitchen FRACTURE SURGERY Right    below the knee - 2 bones broke has plates  . HEMORRHOID BANDING N/A 08/22/2014   Procedure: HEMORRHOID BANDING;  Surgeon: [name], MD;  Location: [facility];  Service: Endoscopy;  Laterality: N/A;  . HEMORRHOID SURGERY N/A 03/30/2018   Procedure: EXTENSIVE HEMORRHOIDECTOMY;  Surgeon: [name], MD;  Location: [facility];  Service: General;  Laterality: N/A;  .  OPEN REDUCTION INTERNAL FIXATION (ORIF) TIBIA/FIBULA FRACTURE Right 2013   broke tibia and fibula after falling down stairs  . PROLAPSED UTERINE FIBROID LIGATION  2015  . TOTAL ABDOMINAL HYSTERECTOMY      Social History   Socioeconomic History  . Marital status: Married    Spouse name: george   . Number of children: 1  . Years of education: Not on file  . Highest education level: Not on file  Occupational History  . Occupation: Retired    Comment: Education officer, community  . Financial resource strain: Not on file  . Food insecurity    Worry: Not on file    Inability: Not on file  . Transportation needs    Medical: Not on file    Non-medical: Not on file  Tobacco Use  . Smoking status: Never Smoker  . Smokeless tobacco: Never Used  Substance and Sexual Activity  . Alcohol use: No    Alcohol/week: 0.0 standard drinks  . Drug use: No  . Sexual activity: Not Currently    Birth control/protection: Surgical, Post-menopausal     Comment: hyst  Lifestyle  . Physical activity    Days per week: Not on file    Minutes per session: Not on file  . Stress: Not on file  Relationships  . Social Herbalist on phone: Not on file    Gets together: Not on file    Attends religious service: Not on file    Active member of club or organization: Not on file    Attends meetings of clubs or organizations: Not on file    Relationship status: Not on file  . Intimate partner violence    Fear of current or ex partner: Not on file    Emotionally abused: Not on file    Physically abused: Not on file    Forced sexual activity: Not on file  Other Topics Concern  . Not on file  Social History Narrative   Married   No regular exercise        Objective:    BP 128/72   Pulse 70   Temp (!) 96.8 F (36 C) (Temporal)   Ht 5\' 1"  (1.549 m)   Wt 128 lb 6.4 oz (58.2 kg)   SpO2 94%   BMI 24.26 kg/m   Physical Exam Vitals signs reviewed.  Constitutional:      General: She is not in acute distress.    Appearance: Normal appearance. She is normal weight. She is not ill-appearing, toxic-appearing or diaphoretic.  HENT:     Head: Normocephalic and atraumatic.     Right Ear: Tympanic membrane, ear canal and external ear normal. There is no impacted cerumen.     Left Ear: Tympanic membrane, ear canal and external ear normal. There is no impacted cerumen.     Nose: Nose normal. No congestion or rhinorrhea.     Mouth/Throat:     Mouth: Mucous membranes are moist.     Pharynx: Oropharynx is clear. No oropharyngeal exudate or posterior oropharyngeal erythema.  Eyes:     General: No scleral icterus.       Right eye: No discharge.        Left eye: No discharge.     Conjunctiva/sclera: Conjunctivae normal.     Pupils: Pupils are equal, round, and reactive to light.  Neck:     Musculoskeletal: Normal range of motion and neck supple. No neck rigidity or muscular tenderness.  Cardiovascular:  Rate and Rhythm: Normal  rate. Rhythm regularly irregular.     Heart sounds: Murmur present. Systolic murmur present with a grade of 2/6. No friction rub. No gallop.   Pulmonary:     Effort: Pulmonary effort is normal. No respiratory distress.     Breath sounds: Normal breath sounds. No stridor. No wheezing, rhonchi or rales.  Abdominal:     General: Abdomen is flat. Bowel sounds are normal. There is no distension.     Palpations: Abdomen is soft. There is no mass.     Tenderness: There is no abdominal tenderness. There is no guarding or rebound.     Hernia: No hernia is present.  Musculoskeletal: Normal range of motion.  Lymphadenopathy:     Cervical: No cervical adenopathy.  Skin:    General: Skin is warm and dry.     Capillary Refill: Capillary refill takes less than 2 seconds.  Neurological:     General: No focal deficit present.     Mental Status: She is alert and oriented to person, place, and time. Mental status is at baseline.  Psychiatric:        Mood and Affect: Mood normal.        Behavior: Behavior normal.        Thought Content: Thought content normal.        Judgment: Judgment normal.     Lab Results  Component Value Date   TSH 1.483 04/10/2015   Lab Results  Component Value Date   WBC 7.5 07/10/2019   HGB 16.1 (H) 07/10/2019   HCT 46.9 (H) 07/10/2019   MCV 92.0 07/10/2019   PLT 201 07/10/2019   Lab Results  Component Value Date   NA 139 07/10/2019   K 3.5 07/10/2019   CO2 27 07/10/2019   GLUCOSE 137 (H) 07/10/2019   BUN 11 07/10/2019   CREATININE 0.78 07/10/2019   BILITOT 0.6 07/10/2019   ALKPHOS 74 07/10/2019   AST 31 07/10/2019   ALT 23 07/10/2019   PROT 8.3 (H) 07/10/2019   ALBUMIN 4.5 07/10/2019   CALCIUM 9.3 07/10/2019   ANIONGAP 13 07/10/2019   Lab Results  Component Value Date   CHOL 171 12/04/2018   Lab Results  Component Value Date   HDL 37 (L) 12/04/2018   Lab Results  Component Value Date   LDLCALC 73 12/04/2018   Lab Results  Component Value Date    TRIG 307 (H) 12/04/2018   Lab Results  Component Value Date   CHOLHDL 4.6 (H) 12/04/2018   Lab Results  Component Value Date   HGBA1C 5.6 02/28/2017

## 2019-09-19 ENCOUNTER — Ambulatory Visit (INDEPENDENT_AMBULATORY_CARE_PROVIDER_SITE_OTHER): Payer: Medicare HMO | Admitting: Family Medicine

## 2019-09-19 ENCOUNTER — Encounter: Payer: Self-pay | Admitting: Family Medicine

## 2019-09-19 ENCOUNTER — Telehealth: Payer: Self-pay | Admitting: Cardiology

## 2019-09-19 ENCOUNTER — Other Ambulatory Visit: Payer: Self-pay

## 2019-09-19 VITALS — BP 126/83 | HR 75 | Temp 97.3°F | Ht 61.0 in | Wt 130.4 lb

## 2019-09-19 DIAGNOSIS — Z79899 Other long term (current) drug therapy: Secondary | ICD-10-CM

## 2019-09-19 DIAGNOSIS — G479 Sleep disorder, unspecified: Secondary | ICD-10-CM | POA: Diagnosis not present

## 2019-09-19 DIAGNOSIS — I4891 Unspecified atrial fibrillation: Secondary | ICD-10-CM

## 2019-09-19 NOTE — Telephone Encounter (Signed)
Called AFIB clinic they said to tell pt to come in tomorrow 2pm ot Friday 830am. Called pt and she states that she would like to come Friday @ 830am she will arrive early in a mask. AFIB clinic notified. She will put there down for Friday.

## 2019-09-19 NOTE — Progress Notes (Signed)
Assessment & Plan:  1. Atrial fibrillation, unspecified type ([place]) - Encouraged patient to discuss atrial fibrillation with her cardiologist.   2. Difficulty sleeping - Discussed appropriate use of Melatonin with patient and encouraged her to try it as she has been unwilling to try anything to help her with sleep previously.   3. Long-term current use of benzodiazepine - Discussed again that I would not be prescribing Xanax to patient as she was taking it to help her sleep. Explained risks associated with benzodiazepines.    Follow up plan: Return as scheduled for next week.  [name], MSN, APRN, FNP-C [facility]  Subjective:   Patient ID: Norma Barajas, female    DOB: 1941/08/24, 78 y.o.   MRN: [id number]  HPI: Norma Barajas is a 78 y.o. female presenting on 09/19/2019 for chronic Afib  Patient reports her heart rate goes up at times to the 120s. She does not notice this during the day but states she sometimes notices it at night when she lies down to go to bed. She is currently taking warfarin, sotalol, and metoprolol due to atrial fibrillation. She has a cardiologist (Dr. Percival Spanish) who she has not yet contacted to discuss. She then starts talking about how much better her life was when she had her Xanax. States this didn't happen when she took Xanax. She has not had Xanax since June (five months ago).   She then asked if it would be okay if she took Melatonin to help her sleep.   ROS: Negative unless specifically indicated above in HPI.   Relevant past medical history reviewed and updated as indicated.   Allergies and medications reviewed and updated.   Current Outpatient Medications:  .  amLODipine (NORVASC) 10 MG tablet, Take 1 tablet (10 mg total) by mouth daily., Disp: 90 tablet, Rfl: 1 .  Calcium Carbonate-Vitamin D (CALCIUM-VITAMIN D3 PO), Take 1 tablet by mouth daily., Disp: , Rfl:  .  cholecalciferol (VITAMIN D3) 25 MCG (1000 UT) tablet,  Take 1,000 Units by mouth daily., Disp: , Rfl:  .  metoprolol tartrate (LOPRESSOR) 25 MG tablet, Take 0.5 tablets (12.5 mg total) by mouth 2 (two) times daily., Disp: 90 tablet, Rfl: 1 .  Multiple Vitamin (MULTIVITAMIN) capsule, Take 1 capsule by mouth every evening., Disp: , Rfl:  .  Omega-3 Fatty Acids (FISH OIL) 1000 MG CAPS, Take 2,000 mg by mouth 2 (two) times daily., Disp: , Rfl:  .  omeprazole (PRILOSEC) 20 MG capsule, Take 1 capsule (20 mg total) by mouth daily., Disp: 90 capsule, Rfl: 3 .  simvastatin (ZOCOR) 40 MG tablet, TAKE 1 TABLET EVERY DAY, Disp: 90 tablet, Rfl: 0 .  sotalol (BETAPACE) 80 MG tablet, Take 1 tablet (80 mg total) by mouth daily., Disp: 90 tablet, Rfl: 1 .  warfarin (COUMADIN) 3 MG tablet, TAKE 1/2 TO 1 TABLET EVERY DAY AS DIRECTED  BY  ANTICOAGULATION  CLINIC, Disp: 90 tablet, Rfl: 1  Allergies  Allergen Reactions  . Codeine Nausea And Vomiting  . Triamterene-Hctz Other (See Comments)    weakness  . Atorvastatin Other (See Comments)    Myalgias   . Crestor [Rosuvastatin Calcium] Other (See Comments)    weakness  . Morphine Nausea Only  . Risedronate Sodium Other (See Comments)    ACTONEL REACTION: reflux    Objective:   BP 126/83   Pulse 75   Temp (!) 97.3 F (36.3 C) (Temporal)   Ht 5\' 1"  (1.549 m)  Wt 130 lb 6.4 oz (59.1 kg)   SpO2 95%   BMI 24.64 kg/m    Physical Exam Vitals signs reviewed.  Constitutional:      General: She is not in acute distress.    Appearance: Normal appearance. She is normal weight. She is not ill-appearing, toxic-appearing or diaphoretic.  HENT:     Head: Normocephalic and atraumatic.  Eyes:     General: No scleral icterus.       Right eye: No discharge.        Left eye: No discharge.     Conjunctiva/sclera: Conjunctivae normal.  Neck:     Musculoskeletal: Normal range of motion.  Cardiovascular:     Rate and Rhythm: Normal rate. Rhythm regularly irregular.     Heart sounds: Murmur present. Systolic murmur  present with a grade of 2/6. No friction rub. No gallop.   Pulmonary:     Effort: Pulmonary effort is normal. No respiratory distress.     Breath sounds: Normal breath sounds. No stridor. No wheezing, rhonchi or rales.  Musculoskeletal: Normal range of motion.  Skin:    General: Skin is warm and dry.     Capillary Refill: Capillary refill takes less than 2 seconds.  Neurological:     General: No focal deficit present.     Mental Status: She is alert and oriented to person, place, and time. Mental status is at baseline.  Psychiatric:        Mood and Affect: Mood normal.        Behavior: Behavior normal.        Thought Content: Thought content normal.        Judgment: Judgment normal.

## 2019-09-19 NOTE — Telephone Encounter (Signed)
New Message      Pt says she got her flu shot Oct 7th, Pt says Afib started After.      Patient c/o Palpitations:  High priority if patient c/o lightheadedness, shortness of breath, or chest pain  1) How long have you had palpitations/irregular HR/ Afib? Are you having the symptoms now? Oct 7th   2) Are you currently experiencing lightheadedness, SOB or CP? No, pt says she gets really tired easily   3) Do you have a history of afib (atrial fibrillation) or irregular heart rhythm? Yes   4) Have you checked your BP or HR? (document readings if available): BP 126/83 HR 75   5) Are you experiencing any other symptoms? Pt saw her Dr today who told her that her heart was fluttering and to call her cardiologist.

## 2019-09-20 ENCOUNTER — Ambulatory Visit (HOSPITAL_COMMUNITY): Payer: Medicare HMO | Admitting: Physician Assistant

## 2019-09-21 ENCOUNTER — Ambulatory Visit (HOSPITAL_COMMUNITY): Payer: Medicare HMO | Admitting: Physician Assistant

## 2019-09-24 ENCOUNTER — Encounter: Payer: Self-pay | Admitting: Family Medicine

## 2019-09-25 ENCOUNTER — Ambulatory Visit (HOSPITAL_COMMUNITY): Payer: Medicare HMO | Admitting: Physician Assistant

## 2019-09-26 ENCOUNTER — Other Ambulatory Visit: Payer: Self-pay

## 2019-09-26 NOTE — Progress Notes (Signed)
Assessment & Plan:  1-2. Chronic atrial fibrillation (HCC)/Chronic anticoagulation Description   Continue taking warfarin 1.5 mg on Sunday, Tuesday, Thursday and 3 mg on Monday, Wednesday, Friday and Saturday.  INR was 2.0 today (goal is 2.0 to 3.0). Next INR in 6 weeks.     - CoaguChek XS/INR Waived - warfarin (COUMADIN) 3 MG tablet; Take 1.5 mg by mouth on Sun, Tu, and Th, take 3 mg on Mon, Wed, Fri, and Sat.  Dispense: 90 tablet; Refill: 1 - Sensation of atrial fibrillation as resolved.  Norma Barajas canceled her appointment at the atrial fibrillation clinic in [place].  3. Difficulty sleeping - Patient not happy with Unisom either.  Norma Barajas reports Norma Barajas may or may not try melatonin.  Discussed again why Xanax was discontinued.  Norma Barajas declined a prescription medication to help with sleep.  4. GAD (generalized anxiety disorder) - Patient does feel her anxiety is why Norma Barajas does not sleep well but declines medication to treat.   Return in about 6 weeks (around 11/08/2019) for INR.  [name], MSN, APRN, FNP-C [facility]  Subjective:    Patient ID: Norma Barajas, female    DOB: 06-08-1941, 78 y.o.   MRN: [id number]  Patient Care Team: Norma Brooklyn, FNP as PCP - General (Family Medicine) Norma Breeding, MD as PCP - Cardiology (Cardiology) Norma Binder, MD as Attending Physician (Gastroenterology) Norma Barajas, OD as Consulting Physician (Optometry)   Chief Complaint:  Chief Complaint  Patient presents with  . Anticoagulation    HPI: Norma Barajas is a 78 y.o. female presenting on 09/27/2019 for Anticoagulation  Anticoagulation: Patient here for anticoagulation monitoring. Indication: atrial fibrillation Bleeding Signs/Symptoms:  None Thromboembolic Signs/Symptoms:  None  Missed Coumadin Doses:  None Medication Changes:  no Dietary Changes:  no Bacterial/Viral Infection:  no  Other Concerns:  no   When I saw patient last week Norma Barajas was going to  try melatonin for sleep.  Today Norma Barajas reports Norma Barajas did try Unisom which was not effective for her.  Norma Barajas has not tried the melatonin.  Also last week Norma Barajas was concerned about experiencing some atrial fibrillation.  Norma Barajas was advised to reach out to her cardiologist, which Norma Barajas did.  Norma Barajas reports that Norma Barajas did have an appointment at the atrial fibrillation clinic in [place] but her symptoms resolved and Norma Barajas canceled her appointment.  New complaints: None  Social history:  Relevant past medical, surgical, family and social history reviewed and updated as indicated. Interim medical history since our last visit reviewed.  Allergies and medications reviewed and updated.  DATA REVIEWED: CHART IN EPIC  ROS: Negative unless specifically indicated above in HPI.    Current Outpatient Medications:  .  amLODipine (NORVASC) 10 MG tablet, Take 1 tablet (10 mg total) by mouth daily., Disp: 90 tablet, Rfl: 1 .  Calcium Carbonate-Vitamin D (CALCIUM-VITAMIN D3 PO), Take 1 tablet by mouth daily., Disp: , Rfl:  .  cholecalciferol (VITAMIN D3) 25 MCG (1000 UT) tablet, Take 1,000 Units by mouth daily., Disp: , Rfl:  .  metoprolol tartrate (LOPRESSOR) 25 MG tablet, Take 0.5 tablets (12.5 mg total) by mouth 2 (two) times daily., Disp: 90 tablet, Rfl: 1 .  Multiple Vitamin (MULTIVITAMIN) capsule, Take 1 capsule by mouth every evening., Disp: , Rfl:  .  Omega-3 Fatty Acids (FISH OIL) 1000 MG CAPS, Take 2,000 mg by mouth 2 (two) times daily., Disp: , Rfl:  .  omeprazole (PRILOSEC) 20 MG capsule, Take 1 capsule (  20 mg total) by mouth daily., Disp: 90 capsule, Rfl: 3 .  simvastatin (ZOCOR) 40 MG tablet, TAKE 1 TABLET EVERY DAY, Disp: 90 tablet, Rfl: 0 .  sotalol (BETAPACE) 80 MG tablet, Take 1 tablet (80 mg total) by mouth daily., Disp: 90 tablet, Rfl: 1 .  warfarin (COUMADIN) 3 MG tablet, Take 1.5 mg by mouth on Sun, Tu, and Th, take 3 mg on Mon, Wed, Fri, and Sat., Disp: 90 tablet, Rfl: 1   Allergies  Allergen  Reactions  . Codeine Nausea And Vomiting  . Triamterene-Hctz Other (See Comments)    weakness  . Atorvastatin Other (See Comments)    Myalgias   . Crestor [Rosuvastatin Calcium] Other (See Comments)    weakness  . Morphine Nausea Only  . Risedronate Sodium Other (See Comments)    ACTONEL REACTION: reflux   Past Medical History:  Diagnosis Date  . 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 Anxiety   . Arthritis    "right leg" (04/11/2015)  . BPPV (benign paroxysmal positional vertigo) 03/23/2016  . Cataract   . Coronary atherosclerosis of native coronary artery    a. Nonobstructive minimal CAD 10/2005.  Marland Kitchen Depression   . Diastolic dysfunction    Grade 1. Ejection fraction 60-65%.  Marland Kitchen Dysrhythmia    a fib  . Erosive esophagitis   . Essential hypertension   . GERD (gastroesophageal reflux disease)   . Grade III hemorrhoids   . History of hiatal hernia   . Hypercholesterolemia   . Internal hemorrhoids with complication [id number]   OCT 2015 FLEX SIG/IH BANDING    . Migraine    "used to have them right bad; I don't now" (04/11/2015)  . MITRAL REGURGITATION 04/27/2010   Qualifier: Diagnosis of  By: [name], MD, [name]   . Osteoporosis   . Paroxysmal atrial fibrillation (HCC)   . PONV (postoperative nausea and vomiting)   . Vertigo     Past Surgical History:  Procedure Laterality Date  . BRAVO [place] STUDY  11/15/2012   Procedure: BRAVO [place];  Surgeon: Norma Binder, MD;  Location: [facility];  Service: Endoscopy;;  . CATARACT EXTRACTION W/ INTRAOCULAR LENS  IMPLANT, BILATERAL Bilateral   . COLONOSCOPY  2008   Dr. [name]: internal hemorrhoids   . DILATION AND CURETTAGE OF UTERUS    . ESOPHAGOGASTRODUODENOSCOPY (EGD) WITH ESOPHAGEAL DILATION  2001   Dr. [name]: erosive esophagitis, esophageal stricture, duodenitis, s/p Savary dilation  . ESOPHAGOGASTRODUODENOSCOPY (EGD) WITH ESOPHAGEAL DILATION  11/15/2012   [id number] web was found & MOST LIKELY CAUSE  FOR DYAPHAGIA/Polyp was found in the gastric body and gastric fundus/ gastritis on bx  . EYE SURGERY Bilateral    "laser OR after cataract OR; cause I couldn't see"  . FLEXIBLE SIGMOIDOSCOPY N/A 08/22/2014   mild diverticulosis in sigmoid, moderate sized Grade 3 hemorrhoids s/p banding X 3.   Marland Kitchen FRACTURE SURGERY Right    below the knee - 2 bones broke has plates  . HEMORRHOID BANDING N/A 08/22/2014   Procedure: HEMORRHOID BANDING;  Surgeon: Norma Binder, MD;  Location: [facility];  Service: Endoscopy;  Laterality: N/A;  . HEMORRHOID SURGERY N/A 03/30/2018   Procedure: EXTENSIVE HEMORRHOIDECTOMY;  Surgeon: [name], MD;  Location: [facility] ORS;  Service: General;  Laterality: N/A;  . OPEN REDUCTION INTERNAL FIXATION (ORIF) TIBIA/FIBULA FRACTURE Right 2013   broke tibia and fibula after falling down stairs  . PROLAPSED UTERINE FIBROID LIGATION  2015  .  TOTAL ABDOMINAL HYSTERECTOMY      Social History   Socioeconomic History  . Marital status: Married    Spouse name: george   . Number of children: 1  . Years of education: Not on file  . Highest education level: Not on file  Occupational History  . Occupation: Retired    Comment: Education officer, community  . Financial resource strain: Not on file  . Food insecurity    Worry: Not on file    Inability: Not on file  . Transportation needs    Medical: Not on file    Non-medical: Not on file  Tobacco Use  . Smoking status: Never Smoker  . Smokeless tobacco: Never Used  Substance and Sexual Activity  . Alcohol use: No    Alcohol/week: 0.0 standard drinks  . Drug use: No  . Sexual activity: Not Currently    Birth control/protection: Surgical, Post-menopausal    Comment: hyst  Lifestyle  . Physical activity    Days per week: Not on file    Minutes per session: Not on file  . Stress: Not on file  Relationships  . Social Herbalist on phone: Not on file    Gets together: Not on file    Attends religious service:  Not on file    Active member of club or organization: Not on file    Attends meetings of clubs or organizations: Not on file    Relationship status: Not on file  . Intimate partner violence    Fear of current or ex partner: Not on file    Emotionally abused: Not on file    Physically abused: Not on file    Forced sexual activity: Not on file  Other Topics Concern  . Not on file  Social History Narrative   Married   No regular exercise        Objective:    BP 137/77   Pulse 68   Temp 97.7 F (36.5 C) (Temporal)   Ht 5\' 1"  (1.549 m)   Wt 128 lb 9.6 oz (58.3 kg)   SpO2 97%   BMI 24.30 kg/m   Physical Exam Vitals signs reviewed.  Constitutional:      General: Norma Barajas is not in acute distress.    Appearance: Normal appearance. Norma Barajas is normal weight. Norma Barajas is not ill-appearing, toxic-appearing or diaphoretic.  HENT:     Head: Normocephalic and atraumatic.  Eyes:     General: No scleral icterus.       Right eye: No discharge.        Left eye: No discharge.     Conjunctiva/sclera: Conjunctivae normal.  Neck:     Musculoskeletal: Normal range of motion.  Cardiovascular:     Rate and Rhythm: Normal rate. Rhythm regularly irregular.     Heart sounds: Murmur present. Systolic murmur present with a grade of 2/6. No friction rub. No gallop.   Pulmonary:     Effort: Pulmonary effort is normal. No respiratory distress.     Breath sounds: Normal breath sounds. No stridor. No wheezing, rhonchi or rales.  Musculoskeletal: Normal range of motion.  Skin:    General: Skin is warm and dry.     Capillary Refill: Capillary refill takes less than 2 seconds.  Neurological:     General: No focal deficit present.     Mental Status: Norma Barajas is alert and oriented to person, place, and time. Mental status is at baseline.  Psychiatric:  Mood and Affect: Mood normal.        Behavior: Behavior normal.        Thought Content: Thought content normal.        Judgment: Judgment normal.     Lab  Results  Component Value Date   TSH 1.483 04/10/2015   Lab Results  Component Value Date   WBC 7.5 07/10/2019   HGB 16.1 (H) 07/10/2019   HCT 46.9 (H) 07/10/2019   MCV 92.0 07/10/2019   PLT 201 07/10/2019   Lab Results  Component Value Date   NA 139 07/10/2019   K 3.5 07/10/2019   CO2 27 07/10/2019   GLUCOSE 137 (H) 07/10/2019   BUN 11 07/10/2019   CREATININE 0.78 07/10/2019   BILITOT 0.6 07/10/2019   ALKPHOS 74 07/10/2019   AST 31 07/10/2019   ALT 23 07/10/2019   PROT 8.3 (H) 07/10/2019   ALBUMIN 4.5 07/10/2019   CALCIUM 9.3 07/10/2019   ANIONGAP 13 07/10/2019   Lab Results  Component Value Date   CHOL 171 12/04/2018   Lab Results  Component Value Date   HDL 37 (L) 12/04/2018   Lab Results  Component Value Date   LDLCALC 73 12/04/2018   Lab Results  Component Value Date   TRIG 307 (H) 12/04/2018   Lab Results  Component Value Date   CHOLHDL 4.6 (H) 12/04/2018   Lab Results  Component Value Date   HGBA1C 5.6 02/28/2017

## 2019-09-27 ENCOUNTER — Ambulatory Visit (INDEPENDENT_AMBULATORY_CARE_PROVIDER_SITE_OTHER): Payer: Medicare HMO | Admitting: Family Medicine

## 2019-09-27 ENCOUNTER — Encounter: Payer: Self-pay | Admitting: Family Medicine

## 2019-09-27 VITALS — BP 137/77 | HR 68 | Temp 97.7°F | Ht 61.0 in | Wt 128.6 lb

## 2019-09-27 DIAGNOSIS — Z7901 Long term (current) use of anticoagulants: Secondary | ICD-10-CM

## 2019-09-27 DIAGNOSIS — I482 Chronic atrial fibrillation, unspecified: Secondary | ICD-10-CM

## 2019-09-27 DIAGNOSIS — G479 Sleep disorder, unspecified: Secondary | ICD-10-CM | POA: Diagnosis not present

## 2019-09-27 DIAGNOSIS — F411 Generalized anxiety disorder: Secondary | ICD-10-CM | POA: Diagnosis not present

## 2019-09-27 LAB — COAGUCHEK XS/INR WAIVED
INR: 2 — ABNORMAL HIGH (ref 0.9–1.1)
Prothrombin Time: 24.3 s

## 2019-09-27 MED ORDER — WARFARIN SODIUM 3 MG PO TABS: ORAL_TABLET | ORAL | 1 refills | Status: DC

## 2019-11-12 ENCOUNTER — Other Ambulatory Visit: Payer: Self-pay

## 2019-11-13 ENCOUNTER — Encounter: Payer: Self-pay | Admitting: Family Medicine

## 2019-11-13 ENCOUNTER — Ambulatory Visit (INDEPENDENT_AMBULATORY_CARE_PROVIDER_SITE_OTHER): Payer: Medicare HMO | Admitting: Family Medicine

## 2019-11-13 VITALS — BP 131/72 | HR 67 | Temp 97.5°F | Ht 61.0 in | Wt 127.8 lb

## 2019-11-13 DIAGNOSIS — Z7901 Long term (current) use of anticoagulants: Secondary | ICD-10-CM | POA: Diagnosis not present

## 2019-11-13 DIAGNOSIS — I482 Chronic atrial fibrillation, unspecified: Secondary | ICD-10-CM | POA: Diagnosis not present

## 2019-11-13 LAB — COAGUCHEK XS/INR WAIVED
INR: 1.9 — ABNORMAL HIGH (ref 0.9–1.1)
Prothrombin Time: 22.6 s

## 2019-11-13 NOTE — Progress Notes (Signed)
Assessment & Plan:  1-2. Chronic atrial fibrillation (HCC)/Chronic anticoagulation - CoaguChek XS/INR Waived Description   Take an extra 1.5 mg today, then continue taking warfarin 1.5 mg on Sunday, Tuesday, Thursday and 3 mg on Monday, Wednesday, Friday and Saturday.  INR was 1.9 today (goal is 2.0 to 3.0). Next INR in 4 weeks.       Follow up plan: Return in about 4 weeks (around 12/11/2019) for annual physical.  [name], MSN, APRN, FNP-C [facility]  Subjective:   Patient ID: Norma Barajas, female    DOB: December 08, 1940, 79 y.o.   MRN: [id number]  HPI: Norma Barajas is a 79 y.o. female presenting on 11/13/2019 for Coagulation Disorder  Anticoagulation: Patient here for anticoagulation monitoring. Indication: atrial fibrillation Bleeding Signs/Symptoms:  None Thromboembolic Signs/Symptoms:  None  Missed Coumadin Doses:  None Medication Changes:  no Dietary Changes:  patient does report eating more greens than usual due to the New Year tradition.  Bacterial/Viral Infection:  no  Other Concerns:  no   ROS: Negative unless specifically indicated above in HPI.   Relevant past medical history reviewed and updated as indicated.   Allergies and medications reviewed and updated.   Current Outpatient Medications:  .  amLODipine (NORVASC) 10 MG tablet, Take 1 tablet (10 mg total) by mouth daily., Disp: 90 tablet, Rfl: 1 .  Calcium Carbonate-Vitamin D (CALCIUM-VITAMIN D3 PO), Take 1 tablet by mouth daily., Disp: , Rfl:  .  cholecalciferol (VITAMIN D3) 25 MCG (1000 UT) tablet, Take 1,000 Units by mouth daily., Disp: , Rfl:  .  metoprolol tartrate (LOPRESSOR) 25 MG tablet, Take 0.5 tablets (12.5 mg total) by mouth 2 (two) times daily., Disp: 90 tablet, Rfl: 1 .  Multiple Vitamin (MULTIVITAMIN) capsule, Take 1 capsule by mouth every evening., Disp: , Rfl:  .  Omega-3 Fatty Acids (FISH OIL) 1000 MG CAPS, Take 2,000 mg by mouth 2 (two) times daily., Disp: ,  Rfl:  .  omeprazole (PRILOSEC) 20 MG capsule, Take 1 capsule (20 mg total) by mouth daily., Disp: 90 capsule, Rfl: 3 .  simvastatin (ZOCOR) 40 MG tablet, TAKE 1 TABLET EVERY DAY, Disp: 90 tablet, Rfl: 0 .  sotalol (BETAPACE) 80 MG tablet, Take 1 tablet (80 mg total) by mouth daily., Disp: 90 tablet, Rfl: 1 .  warfarin (COUMADIN) 3 MG tablet, Take 1.5 mg by mouth on Sun, Tu, and Th, take 3 mg on Mon, Wed, Fri, and Sat., Disp: 90 tablet, Rfl: 1  Allergies  Allergen Reactions  . Codeine Nausea And Vomiting  . Triamterene-Hctz Other (See Comments)    weakness  . Atorvastatin Other (See Comments)    Myalgias   . Crestor [Rosuvastatin Calcium] Other (See Comments)    weakness  . Morphine Nausea Only  . Risedronate Sodium Other (See Comments)    ACTONEL REACTION: reflux    Objective:   BP 131/72   Pulse 67   Temp (!) 97.5 F (36.4 C) (Temporal)   Ht 5\' 1"  (1.549 m)   Wt 127 lb 12.8 oz (58 kg)   SpO2 94%   BMI 24.15 kg/m    Physical Exam Vitals reviewed.  Constitutional:      General: She is not in acute distress.    Appearance: Normal appearance. She is normal weight. She is not ill-appearing, toxic-appearing or diaphoretic.  HENT:     Head: Normocephalic and atraumatic.  Eyes:     General: No scleral icterus.  Right eye: No discharge.        Left eye: No discharge.     Conjunctiva/sclera: Conjunctivae normal.  Cardiovascular:     Rate and Rhythm: Normal rate. Rhythm regularly irregular.     Heart sounds: Murmur present. Systolic murmur present with a grade of 2/6. No friction rub. No gallop.   Pulmonary:     Effort: Pulmonary effort is normal. No respiratory distress.     Breath sounds: Normal breath sounds. No stridor. No wheezing, rhonchi or rales.  Musculoskeletal:        General: Normal range of motion.     Cervical back: Normal range of motion.  Skin:    General: Skin is warm and dry.     Capillary Refill: Capillary refill takes less than 2 seconds.   Neurological:     General: No focal deficit present.     Mental Status: She is alert and oriented to person, place, and time. Mental status is at baseline.  Psychiatric:        Mood and Affect: Mood normal.        Behavior: Behavior normal.        Thought Content: Thought content normal.        Judgment: Judgment normal.

## 2019-11-16 ENCOUNTER — Encounter: Payer: Self-pay | Admitting: Family Medicine

## 2019-12-05 ENCOUNTER — Other Ambulatory Visit: Payer: Self-pay

## 2019-12-05 ENCOUNTER — Other Ambulatory Visit: Payer: Medicare HMO

## 2019-12-05 DIAGNOSIS — E782 Mixed hyperlipidemia: Secondary | ICD-10-CM

## 2019-12-05 DIAGNOSIS — I10 Essential (primary) hypertension: Secondary | ICD-10-CM

## 2019-12-05 DIAGNOSIS — I4891 Unspecified atrial fibrillation: Secondary | ICD-10-CM

## 2019-12-06 LAB — CMP14+EGFR
ALT: 14 IU/L (ref 0–32)
AST: 22 IU/L (ref 0–40)
Albumin/Globulin Ratio: 1.6 (ref 1.2–2.2)
Albumin: 4.6 g/dL (ref 3.7–4.7)
Alkaline Phosphatase: 86 IU/L (ref 39–117)
BUN/Creatinine Ratio: 14 (ref 12–28)
BUN: 9 mg/dL (ref 8–27)
Bilirubin Total: 0.3 mg/dL (ref 0.0–1.2)
CO2: 29 mmol/L (ref 20–29)
Calcium: 9.1 mg/dL (ref 8.7–10.3)
Chloride: 99 mmol/L (ref 96–106)
Creatinine, Ser: 0.66 mg/dL (ref 0.57–1.00)
GFR calc Af Amer: 96 mL/min/{1.73_m2} (ref >59)
GFR calc non Af Amer: 84 mL/min/{1.73_m2} (ref >59)
Globulin, Total: 2.9 g/dL (ref 1.5–4.5)
Glucose: 93 mg/dL (ref 65–99)
Potassium: 3.6 mmol/L (ref 3.5–5.2)
Sodium: 141 mmol/L (ref 134–144)
Total Protein: 7.5 g/dL (ref 6.0–8.5)

## 2019-12-06 LAB — LIPID PANEL
Chol/HDL Ratio: 3.9 ratio (ref 0.0–4.4)
Cholesterol, Total: 186 mg/dL (ref 100–199)
HDL: 48 mg/dL (ref >39)
LDL Chol Calc (NIH): 98 mg/dL (ref 0–99)
Triglycerides: 235 mg/dL — ABNORMAL HIGH (ref 0–149)
VLDL Cholesterol Cal: 40 mg/dL (ref 5–40)

## 2019-12-06 LAB — CBC WITH DIFFERENTIAL/PLATELET
Basophils Absolute: 0.1 10*3/uL (ref 0.0–0.2)
Basos: 1 %
EOS (ABSOLUTE): 0.1 10*3/uL (ref 0.0–0.4)
Eos: 2 %
Hematocrit: 47.1 % — ABNORMAL HIGH (ref 34.0–46.6)
Hemoglobin: 15.7 g/dL (ref 11.1–15.9)
Immature Grans (Abs): 0 10*3/uL (ref 0.0–0.1)
Immature Granulocytes: 0 %
Lymphocytes Absolute: 1.6 10*3/uL (ref 0.7–3.1)
Lymphs: 26 %
MCH: 31.7 pg (ref 26.6–33.0)
MCHC: 33.3 g/dL (ref 31.5–35.7)
MCV: 95 fL (ref 79–97)
Monocytes Absolute: 0.6 10*3/uL (ref 0.1–0.9)
Monocytes: 10 %
Neutrophils Absolute: 3.7 10*3/uL (ref 1.4–7.0)
Neutrophils: 61 %
Platelets: 181 10*3/uL (ref 150–450)
RBC: 4.96 x10E6/uL (ref 3.77–5.28)
RDW: 13 % (ref 11.7–15.4)
WBC: 6 10*3/uL (ref 3.4–10.8)

## 2019-12-09 NOTE — Progress Notes (Signed)
Assessment & Plan:  1. Well adult exam - Preventive health education provided. Patient declined DEXA, TDAP, and Shingrix. She is scheduled for her first COVID-19 vaccine this week. UTD with influenza and pneumonia vaccines.   2-3. Atrial fibrillation, unspecified type (HCC)/Chronic anticoagulation Description   Continue taking warfarin 1.5 mg on Sunday, Tuesday, Thursday and 3 mg on Monday, Wednesday, Friday and Saturday.  INR was 2.0 today (goal is 2.0 to 3.0). Next INR in 6 weeks.     - CoaguChek XS/INR Waived  4. Vertigo - Improving with OTC dizzy medication. Meclizine prescribed.  - meclizine (ANTIVERT) 25 MG tablet; Take 1 tablet (25 mg total) by mouth 3 (three) times daily as needed for dizziness.  Dispense: 30 tablet; Refill: 2  5. Essential hypertension - Well controlled on current regimen.  - amLODipine (NORVASC) 10 MG tablet; Take 1 tablet (10 mg total) by mouth daily.  Dispense: 90 tablet; Refill: 3  6. Gastroesophageal reflux disease, unspecified whether esophagitis present - Well controlled on current regimen.  - omeprazole (PRILOSEC) 20 MG capsule; Take 1 capsule (20 mg total) by mouth daily.  Dispense: 90 capsule; Refill: 3  7. Age-related osteoporosis without current pathological fracture - Patient is taking calcium and vitamin D supplements. Declines DEXA today. She is walking for exercise.   8. Mixed hyperlipidemia - Well controlled on current regimen.   9. Vitamin D deficiency - Well controlled on current regimen.   10. Pulmonary nodule - Patient does not wish to have further monitoring as we previously discussed.    Follow-up: Return in about 6 weeks (around 01/22/2020) for INR.   [name], MSN, APRN, FNP-C [facility]  Subjective:  Patient ID: Norma Barajas, female    DOB: 08/08/1941  Age: 79 y.o. MRN: [id number]  Patient Care Team: Loman Brooklyn, FNP as PCP - General (Family Medicine) Minus Breeding, MD as PCP -  Cardiology (Cardiology) [name], MD as Attending Physician (Gastroenterology) Melina Schools, OD as Consulting Physician (Optometry)   CC:  Chief Complaint  Patient presents with  . Coagulation Disorder  . Annual Exam  . Dizziness    Patient states that it started last wed.     HPI Norma Barajas presents for her annual physical.   Occupation: retired, Marital status: married, Substance use: none Diet: regular, Exercise: walking in the driveway Last eye exam: June 2020 Last dental exam: 5 years ago DEXA: last completed 07/14/17, was scheduled for today but does not wish to complete at this time Immunizations: Flu Vaccine: UTD  Tdap Vaccine: last completed 08/27/2009, declined d/t cost.   Shingrix Vaccine: declined Pneumonia Vaccine: completed COVID-19 Vaccine: scheduled for Thursday  Advanced Directives Patient does not have advanced directives including DNR, living will, healthcare power of attorney, financial power of attorney and MOST form.   DEPRESSION SCREENING PHQ 2/9 Scores 12/11/2019 11/13/2019 09/27/2019 09/19/2019 08/15/2019 07/18/2019 06/22/2019  PHQ - 2 Score 0 0 0 0 0 0 0  PHQ- 9 Score - - - - - - -    Patient is dealing with vertigo today that started last Wednesday. She vomited yesterday. She is taking OTC dizzy medication she bought at [employer] that she reports is working well. When it first started she as eating dinner. It is lasting until she takes medication. Worse with lots of head moving (ex. changing the bed sheets). Better after she vomits.    Review of Systems  Constitutional: Negative for chills, fever, malaise/fatigue and  weight loss.  HENT: Negative for congestion, ear discharge, ear pain, nosebleeds, sinus pain, sore throat and tinnitus.   Eyes: Negative for blurred vision, double vision, pain, discharge and redness.  Respiratory: Negative for cough, shortness of breath and wheezing.   Cardiovascular: Positive for palpitations. Negative for chest  pain and leg swelling.  Gastrointestinal: Negative for abdominal pain, constipation, diarrhea, heartburn, nausea and vomiting.  Genitourinary: Negative for dysuria, frequency and urgency.  Musculoskeletal: Negative for myalgias.  Skin: Negative for rash.  Neurological: Positive for dizziness. Negative for seizures, weakness and headaches.  Psychiatric/Behavioral: Negative for depression, substance abuse and suicidal ideas. The patient is not nervous/anxious.     Current Outpatient Medications:  .  amLODipine (NORVASC) 10 MG tablet, Take 1 tablet (10 mg total) by mouth daily., Disp: 90 tablet, Rfl: 3 .  Calcium Carbonate-Vitamin D (CALCIUM-VITAMIN D3 PO), Take 1 tablet by mouth daily., Disp: , Rfl:  .  cholecalciferol (VITAMIN D3) 25 MCG (1000 UT) tablet, Take 1,000 Units by mouth daily., Disp: , Rfl:  .  metoprolol tartrate (LOPRESSOR) 25 MG tablet, Take 0.5 tablets (12.5 mg total) by mouth 2 (two) times daily., Disp: 90 tablet, Rfl: 1 .  Multiple Vitamin (MULTIVITAMIN) capsule, Take 1 capsule by mouth every evening., Disp: , Rfl:  .  Omega-3 Fatty Acids (FISH OIL) 1000 MG CAPS, Take 2,000 mg by mouth 2 (two) times daily., Disp: , Rfl:  .  omeprazole (PRILOSEC) 20 MG capsule, Take 1 capsule (20 mg total) by mouth daily., Disp: 90 capsule, Rfl: 3 .  simvastatin (ZOCOR) 40 MG tablet, TAKE 1 TABLET EVERY DAY, Disp: 90 tablet, Rfl: 0 .  sotalol (BETAPACE) 80 MG tablet, Take 1 tablet (80 mg total) by mouth daily., Disp: 90 tablet, Rfl: 1 .  warfarin (COUMADIN) 3 MG tablet, Take 1.5 mg by mouth on Sun, Tu, and Th, take 3 mg on Mon, Wed, Fri, and Sat., Disp: 90 tablet, Rfl: 1 .  meclizine (ANTIVERT) 25 MG tablet, Take 1 tablet (25 mg total) by mouth 3 (three) times daily as needed for dizziness., Disp: 30 tablet, Rfl: 2  Allergies  Allergen Reactions  . Codeine Nausea And Vomiting  . Triamterene-Hctz Other (See Comments)    weakness  . Atorvastatin Other (See Comments)    Myalgias   . Crestor  [Rosuvastatin Calcium] Other (See Comments)    weakness  . Morphine Nausea Only  . Risedronate Sodium Other (See Comments)    ACTONEL REACTION: reflux    Past Medical History:  Diagnosis Date  . 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 Anxiety   . Arthritis    "right leg" (04/11/2015)  . BPPV (benign paroxysmal positional vertigo) 03/23/2016  . Cataract   . Coronary atherosclerosis of native coronary artery    a. Nonobstructive minimal CAD 10/2005.  Marland Kitchen Depression   . Diastolic dysfunction    Grade 1. Ejection fraction 60-65%.  Marland Kitchen Dysrhythmia    a fib  . Erosive esophagitis   . Essential hypertension   . GERD (gastroesophageal reflux disease)   . Grade III hemorrhoids   . History of hiatal hernia   . Hypercholesterolemia   . Internal hemorrhoids with complication [id number]   OCT 2015 FLEX SIG/IH BANDING    . Migraine    "used to have them right bad; I don't now" (04/11/2015)  . MITRAL REGURGITATION 04/27/2010   Qualifier: Diagnosis of  By: [name], MD, [name]   . Osteoporosis   . Paroxysmal  atrial fibrillation ([place])   . PONV (postoperative nausea and vomiting)   . Vertigo     Past Surgical History:  Procedure Laterality Date  . BRAVO [place] STUDY  11/15/2012   Procedure: BRAVO [place];  Surgeon: [name], MD;  Location: [facility];  Service: Endoscopy;;  . CATARACT EXTRACTION W/ INTRAOCULAR LENS  IMPLANT, BILATERAL Bilateral   . COLONOSCOPY  2008   Dr. [name]: internal hemorrhoids   . DILATION AND CURETTAGE OF UTERUS    . ESOPHAGOGASTRODUODENOSCOPY (EGD) WITH ESOPHAGEAL DILATION  2001   Dr. [name]: erosive esophagitis, esophageal stricture, duodenitis, s/p Savary dilation  . ESOPHAGOGASTRODUODENOSCOPY (EGD) WITH ESOPHAGEAL DILATION  11/15/2012   [id number] web was found & MOST LIKELY CAUSE FOR DYAPHAGIA/Polyp was found in the gastric body and gastric fundus/ gastritis on bx  . EYE SURGERY Bilateral    "laser OR after cataract OR; cause I couldn't  see"  . FLEXIBLE SIGMOIDOSCOPY N/A 08/22/2014   mild diverticulosis in sigmoid, moderate sized Grade 3 hemorrhoids s/p banding X 3.   Marland Kitchen FRACTURE SURGERY Right    below the knee - 2 bones broke has plates  . HEMORRHOID BANDING N/A 08/22/2014   Procedure: HEMORRHOID BANDING;  Surgeon: [name], MD;  Location: [facility];  Service: Endoscopy;  Laterality: N/A;  . HEMORRHOID SURGERY N/A 03/30/2018   Procedure: EXTENSIVE HEMORRHOIDECTOMY;  Surgeon: [name], MD;  Location: [facility] ORS;  Service: General;  Laterality: N/A;  . OPEN REDUCTION INTERNAL FIXATION (ORIF) TIBIA/FIBULA FRACTURE Right 2013   broke tibia and fibula after falling down stairs  . PROLAPSED UTERINE FIBROID LIGATION  2015  . TOTAL ABDOMINAL HYSTERECTOMY      Family History  Problem Relation Age of Onset  . Colon cancer Mother 59  . Heart disease Mother   . Osteoporosis Mother   . Hip fracture Mother   . Stroke Sister   . Diabetes Sister   . Osteoporosis Sister   . Arthritis Sister   . Uterine cancer Sister   . Stroke Sister   . Heart disease Father   . Hyperlipidemia Brother   . Hypertension Brother   . Heart disease Brother   . Stroke Brother   . Heart disease Sister   . Dementia Sister   . Diabetes Son   . Stroke Son   . Heart attack Neg Hx     Social History   Socioeconomic History  . Marital status: Married    Spouse name: george   . Number of children: 1  . Years of education: Not on file  . Highest education level: Not on file  Occupational History  . Occupation: Retired    Comment: Textile  Tobacco Use  . Smoking status: Never Smoker  . Smokeless tobacco: Never Used  Substance and Sexual Activity  . Alcohol use: No    Alcohol/week: 0.0 standard drinks  . Drug use: No  . Sexual activity: Not Currently    Birth control/protection: Surgical, Post-menopausal    Comment: hyst  Other Topics Concern  . Not on file  Social History Narrative   Married   No regular exercise    Social Determinants of Health   Financial Resource Strain:   . Difficulty of Paying Living Expenses: Not on file  Food Insecurity:   . Worried About Charity fundraiser in the Last Year: Not on file  . Ran Out of Food in the Last Year: Not on file  Transportation Needs:   .  Lack of Transportation (Medical): Not on file  . Lack of Transportation (Non-Medical): Not on file  Physical Activity:   . Days of Exercise per Week: Not on file  . Minutes of Exercise per Session: Not on file  Stress:   . Feeling of Stress : Not on file  Social Connections:   . Frequency of Communication with Friends and Family: Not on file  . Frequency of Social Gatherings with Friends and Family: Not on file  . Attends Religious Services: Not on file  . Active Member of Clubs or Organizations: Not on file  . Attends Archivist Meetings: Not on file  . Marital Status: Not on file  Intimate Partner Violence:   . Fear of Current or Ex-Partner: Not on file  . Emotionally Abused: Not on file  . Physically Abused: Not on file  . Sexually Abused: Not on file      Objective:    BP 128/90   Pulse (!) 114   Temp 98 F (36.7 C) (Temporal)   Ht 5\' 1"  (1.549 m)   Wt 129 lb 9.6 oz (58.8 kg)   SpO2 97%   BMI 24.49 kg/m   Wt Readings from Last 3 Encounters:  12/11/19 129 lb 9.6 oz (58.8 kg)  11/13/19 127 lb 12.8 oz (58 kg)  09/27/19 128 lb 9.6 oz (58.3 kg)   Physical Exam Vitals reviewed.  Constitutional:      General: She is not in acute distress.    Appearance: Normal appearance. She is normal weight. She is not ill-appearing, toxic-appearing or diaphoretic.  HENT:     Head: Normocephalic and atraumatic.     Right Ear: Tympanic membrane, ear canal and external ear normal. There is no impacted cerumen.     Left Ear: Tympanic membrane, ear canal and external ear normal. There is no impacted cerumen.     Nose: Nose normal. No congestion or rhinorrhea.     Mouth/Throat:     Mouth: Mucous  membranes are moist.     Dentition: Has dentures.     Pharynx: Oropharynx is clear. No oropharyngeal exudate or posterior oropharyngeal erythema.  Eyes:     General: No scleral icterus.       Right eye: No discharge.        Left eye: No discharge.     Conjunctiva/sclera: Conjunctivae normal.     Pupils: Pupils are equal, round, and reactive to light.  Cardiovascular:     Rate and Rhythm: Normal rate and regular rhythm.     Heart sounds: Normal heart sounds. No murmur. No friction rub. No gallop.   Pulmonary:     Effort: Pulmonary effort is normal. No respiratory distress.     Breath sounds: Normal breath sounds. No stridor. No wheezing, rhonchi or rales.  Abdominal:     General: Abdomen is flat. Bowel sounds are normal. There is no distension.     Palpations: Abdomen is soft. There is no mass.     Tenderness: There is no abdominal tenderness. There is no guarding or rebound.     Hernia: No hernia is present.  Musculoskeletal:        General: Normal range of motion.     Cervical back: Normal range of motion and neck supple. No rigidity. No muscular tenderness.  Lymphadenopathy:     Cervical: No cervical adenopathy.  Skin:    General: Skin is warm and dry.     Capillary Refill: Capillary refill takes less than 2 seconds.  Neurological:     General: No focal deficit present.     Mental Status: She is alert and oriented to person, place, and time. Mental status is at baseline.  Psychiatric:        Mood and Affect: Mood normal.        Behavior: Behavior normal.        Thought Content: Thought content normal.        Judgment: Judgment normal.     Lab Results  Component Value Date   TSH 1.483 04/10/2015   Lab Results  Component Value Date   WBC 6.0 12/05/2019   HGB 15.7 12/05/2019   HCT 47.1 (H) 12/05/2019   MCV 95 12/05/2019   PLT 181 12/05/2019   Lab Results  Component Value Date   NA 141 12/05/2019   K 3.6 12/05/2019   CO2 29 12/05/2019   GLUCOSE 93 12/05/2019    BUN 9 12/05/2019   CREATININE 0.66 12/05/2019   BILITOT 0.3 12/05/2019   ALKPHOS 86 12/05/2019   AST 22 12/05/2019   ALT 14 12/05/2019   PROT 7.5 12/05/2019   ALBUMIN 4.6 12/05/2019   CALCIUM 9.1 12/05/2019   ANIONGAP 13 07/10/2019   Lab Results  Component Value Date   CHOL 186 12/05/2019   Lab Results  Component Value Date   HDL 48 12/05/2019   Lab Results  Component Value Date   LDLCALC 98 12/05/2019   Lab Results  Component Value Date   TRIG 235 (H) 12/05/2019   Lab Results  Component Value Date   CHOLHDL 3.9 12/05/2019   Lab Results  Component Value Date   HGBA1C 5.6 02/28/2017

## 2019-12-09 NOTE — Patient Instructions (Signed)
Preventive Care 81 Years and Older, Female Preventive care refers to lifestyle choices and visits with your health care provider that can promote health and wellness. This includes:  A yearly physical exam. This is also called an annual well check.  Regular dental and eye exams.  Immunizations.  Screening for certain conditions.  Healthy lifestyle choices, such as diet and exercise. What can I expect for my preventive care visit? Physical exam Your health care provider will check:  Height and weight. These may be used to calculate body mass index (BMI), which is a measurement that tells if you are at a healthy weight.  Heart rate and blood pressure.  Your skin for abnormal spots. Counseling Your health care provider may ask you questions about:  Alcohol, tobacco, and drug use.  Emotional well-being.  Home and relationship well-being.  Sexual activity.  Eating habits.  History of falls.  Memory and ability to understand (cognition).  Work and work Statistician.  Pregnancy and menstrual history. What immunizations do I need?  Influenza (flu) vaccine  This is recommended every year. Tetanus, diphtheria, and pertussis (Tdap) vaccine  You may need a Td booster every 10 years. Varicella (chickenpox) vaccine  You may need this vaccine if you have not already been vaccinated. Zoster (shingles) vaccine  You may need this after age 33. Pneumococcal conjugate (PCV13) vaccine  One dose is recommended after age 33. Pneumococcal polysaccharide (PPSV23) vaccine  One dose is recommended after age 72. Measles, mumps, and rubella (MMR) vaccine  You may need at least one dose of MMR if you were born in 1957 or later. You may also need a second dose. Meningococcal conjugate (MenACWY) vaccine  You may need this if you have certain conditions. Hepatitis A vaccine  You may need this if you have certain conditions or if you travel or work in places where you may be exposed  to hepatitis A. Hepatitis B vaccine  You may need this if you have certain conditions or if you travel or work in places where you may be exposed to hepatitis B. Haemophilus influenzae type b (Hib) vaccine  You may need this if you have certain conditions. You may receive vaccines as individual doses or as more than one vaccine together in one shot (combination vaccines). Talk with your health care provider about the risks and benefits of combination vaccines. What tests do I need? Blood tests  Lipid and cholesterol levels. These may be checked every 5 years, or more frequently depending on your overall health.  Hepatitis C test.  Hepatitis B test. Screening  Lung cancer screening. You may have this screening every year starting at age 39 if you have a 30-pack-year history of smoking and currently smoke or have quit within the past 15 years.  Colorectal cancer screening. All adults should have this screening starting at age 36 and continuing until age 15. Your health care provider may recommend screening at age 23 if you are at increased risk. You will have tests every 1-10 years, depending on your results and the type of screening test.  Diabetes screening. This is done by checking your blood sugar (glucose) after you have not eaten for a while (fasting). You may have this done every 1-3 years.  Mammogram. This may be done every 1-2 years. Talk with your health care provider about how often you should have regular mammograms.  BRCA-related cancer screening. This may be done if you have a family history of breast, ovarian, tubal, or peritoneal cancers.  Other tests  Sexually transmitted disease (STD) testing.  Bone density scan. This is done to screen for osteoporosis. You may have this done starting at age 65. Follow these instructions at home: Eating and drinking  Eat a diet that includes fresh fruits and vegetables, whole grains, lean protein, and low-fat dairy products. Limit  your intake of foods with high amounts of sugar, saturated fats, and salt.  Take vitamin and mineral supplements as recommended by your health care provider.  Do not drink alcohol if your health care provider tells you not to drink.  If you drink alcohol: ? Limit how much you have to 0-1 drink a day. ? Be aware of how much alcohol is in your drink. In the [place] one drink equals one 12 oz bottle of beer (355 mL), one 5 oz glass of wine (148 mL), or one 1 oz glass of hard liquor (44 mL). Lifestyle  Take daily care of your teeth and gums.  Stay active. Exercise for at least 30 minutes on 5 or more days each week.  Do not use any products that contain nicotine or tobacco, such as cigarettes, e-cigarettes, and chewing tobacco. If you need help quitting, ask your health care provider.  If you are sexually active, practice safe sex. Use a condom or other form of protection in order to prevent STIs (sexually transmitted infections).  Talk with your health care provider about taking a low-dose aspirin or statin. What's next?  Go to your health care provider once a year for a well check visit.  Ask your health care provider how often you should have your eyes and teeth checked.  Stay up to date on all vaccines. This information is not intended to replace advice given to you by your health care provider. Make sure you discuss any questions you have with your health care provider. Document Revised: 10/19/2018 Document Reviewed: 10/19/2018 Elsevier Patient Education  2020 Elsevier Inc.   

## 2019-12-10 ENCOUNTER — Other Ambulatory Visit: Payer: Self-pay

## 2019-12-11 ENCOUNTER — Ambulatory Visit (INDEPENDENT_AMBULATORY_CARE_PROVIDER_SITE_OTHER): Payer: Medicare HMO | Admitting: Family Medicine

## 2019-12-11 ENCOUNTER — Ambulatory Visit: Payer: Medicare HMO

## 2019-12-11 ENCOUNTER — Encounter: Payer: Self-pay | Admitting: Family Medicine

## 2019-12-11 VITALS — BP 128/90 | HR 114 | Temp 98.0°F | Ht 61.0 in | Wt 129.6 lb

## 2019-12-11 DIAGNOSIS — I10 Essential (primary) hypertension: Secondary | ICD-10-CM | POA: Diagnosis not present

## 2019-12-11 DIAGNOSIS — E559 Vitamin D deficiency, unspecified: Secondary | ICD-10-CM | POA: Diagnosis not present

## 2019-12-11 DIAGNOSIS — E782 Mixed hyperlipidemia: Secondary | ICD-10-CM | POA: Diagnosis not present

## 2019-12-11 DIAGNOSIS — R911 Solitary pulmonary nodule: Secondary | ICD-10-CM

## 2019-12-11 DIAGNOSIS — M810 Age-related osteoporosis without current pathological fracture: Secondary | ICD-10-CM

## 2019-12-11 DIAGNOSIS — Z7901 Long term (current) use of anticoagulants: Secondary | ICD-10-CM

## 2019-12-11 DIAGNOSIS — R42 Dizziness and giddiness: Secondary | ICD-10-CM

## 2019-12-11 DIAGNOSIS — I4891 Unspecified atrial fibrillation: Secondary | ICD-10-CM | POA: Diagnosis not present

## 2019-12-11 DIAGNOSIS — K219 Gastro-esophageal reflux disease without esophagitis: Secondary | ICD-10-CM | POA: Diagnosis not present

## 2019-12-11 DIAGNOSIS — Z0000 Encounter for general adult medical examination without abnormal findings: Secondary | ICD-10-CM | POA: Diagnosis not present

## 2019-12-11 DIAGNOSIS — I482 Chronic atrial fibrillation, unspecified: Secondary | ICD-10-CM | POA: Diagnosis not present

## 2019-12-11 LAB — COAGUCHEK XS/INR WAIVED
INR: 2 — ABNORMAL HIGH (ref 0.9–1.1)
Prothrombin Time: 23.5 s

## 2019-12-11 MED ORDER — MECLIZINE HCL 25 MG PO TABS: 25.0000 mg | ORAL_TABLET | Freq: Three times a day (TID) | ORAL | 2 refills | Status: DC | PRN

## 2019-12-11 MED ORDER — OMEPRAZOLE 20 MG PO CPDR: 20.0000 mg | DELAYED_RELEASE_CAPSULE | Freq: Every day | ORAL | 3 refills | Status: DC

## 2019-12-11 MED ORDER — AMLODIPINE BESYLATE 10 MG PO TABS: 10.0000 mg | ORAL_TABLET | Freq: Every day | ORAL | 3 refills | Status: DC

## 2019-12-12 ENCOUNTER — Other Ambulatory Visit: Payer: Self-pay | Admitting: Family Medicine

## 2019-12-12 DIAGNOSIS — I482 Chronic atrial fibrillation, unspecified: Secondary | ICD-10-CM

## 2019-12-13 ENCOUNTER — Ambulatory Visit: Payer: Medicare HMO | Attending: Internal Medicine

## 2019-12-13 DIAGNOSIS — Z23 Encounter for immunization: Secondary | ICD-10-CM

## 2019-12-13 NOTE — Progress Notes (Signed)
   Covid-19 Vaccination Clinic  Name:  Norma Barajas    MRN: [id number] DOB: Jun 26, 1941  12/13/2019  Ms. [name] was observed post Covid-19 immunization for 15 minutes without incidence. She was provided with Vaccine Information Sheet and instruction to access the V-Safe system.   Ms. [name] was instructed to call 911 with any severe reactions post vaccine: Marland Kitchen Difficulty breathing  . Swelling of your face and throat  . A fast heartbeat  . A bad rash all over your body  . Dizziness and weakness    Immunizations Administered    Name Date Dose VIS Date Route   Moderna COVID-19 Vaccine 12/13/2019 12:55 PM 0.5 mL 10/09/2019 Intramuscular   Manufacturer: Moderna   Lot: [id number]   [id number]

## 2019-12-31 ENCOUNTER — Ambulatory Visit (INDEPENDENT_AMBULATORY_CARE_PROVIDER_SITE_OTHER): Payer: Medicare HMO | Admitting: Family

## 2019-12-31 ENCOUNTER — Other Ambulatory Visit: Payer: Self-pay

## 2019-12-31 ENCOUNTER — Ambulatory Visit (INDEPENDENT_AMBULATORY_CARE_PROVIDER_SITE_OTHER): Payer: Medicare HMO

## 2019-12-31 ENCOUNTER — Encounter: Payer: Self-pay | Admitting: Family

## 2019-12-31 VITALS — BP 146/83 | HR 77 | Temp 97.3°F | Wt 126.6 lb

## 2019-12-31 DIAGNOSIS — M25562 Pain in left knee: Secondary | ICD-10-CM | POA: Diagnosis not present

## 2019-12-31 DIAGNOSIS — I4891 Unspecified atrial fibrillation: Secondary | ICD-10-CM | POA: Diagnosis not present

## 2019-12-31 DIAGNOSIS — M25552 Pain in left hip: Secondary | ICD-10-CM

## 2019-12-31 DIAGNOSIS — S3992XA Unspecified injury of lower back, initial encounter: Secondary | ICD-10-CM | POA: Diagnosis not present

## 2019-12-31 DIAGNOSIS — W19XXXA Unspecified fall, initial encounter: Secondary | ICD-10-CM

## 2019-12-31 DIAGNOSIS — M533 Sacrococcygeal disorders, not elsewhere classified: Secondary | ICD-10-CM

## 2019-12-31 DIAGNOSIS — S79912A Unspecified injury of left hip, initial encounter: Secondary | ICD-10-CM | POA: Diagnosis not present

## 2019-12-31 DIAGNOSIS — R52 Pain, unspecified: Secondary | ICD-10-CM | POA: Diagnosis not present

## 2019-12-31 NOTE — Patient Instructions (Signed)
Tailbone Injury  The tailbone (coccyx) is the small bone at the lower end of the spine. A tailbone injury may involve stretched ligaments, bruising, or a broken bone (fracture). Tailbone injuries can be painful, and some may take a long time to heal. What are the causes? This condition may be caused by:  Falling and landing on the tailbone.  Repeated strain or friction from sitting for long periods of time. This may include actions such as rowing and bicycling.  Childbirth. In some cases, the cause may not be known. What are the signs or symptoms? Symptoms of this condition include:  Pain in the tailbone area or lower back, especially when sitting.  Pain or difficulty when standing up from a sitting position.  Bruising or swelling in the tailbone area.  Painful bowel movements.  In women, pain during intercourse. How is this diagnosed? This condition may be diagnosed based on:  Your symptoms.  A physical exam. If your health care provider suspects a fracture, you may have additional tests, such as:  X-rays.  CT scan.  MRI. How is this treated? Most tailbone injuries heal on their own in 4-6 weeks. However, recovery time may be longer if the injury involves a fracture. Treatment for this condition may include:  NSAIDs or other over-the-counter medicines to help relieve your pain.  Using a large, rubber or inflated ring or cushion to take pressure off the tailbone when sitting.  Physical therapy.  Injecting the tailbone area with local anesthesia and steroid medicine. This is not normally needed unless the pain does not improve over time with over-the-counter pain medicines. Follow these instructions at home: Activity  Avoid sitting for long periods of time.  To prevent repeating an injury that is caused by strain or friction: ? Wear appropriate padding and sports gear when bicycling and rowing.  Increase your activity as the pain allows. Perform any exercises  that are recommended by your health care provider or physical therapist. Managing pain, stiffness, and swelling  To help decrease discomfort when sitting: ? Sit on your rubber or inflated ring or cushion as told by your health care provider. ? Lean forward when you sit.  If directed, apply ice to the injured area: ? Put ice in a plastic bag. ? Place a towel between your skin and the bag. ? Leave the ice on for 20 minutes, 2-3 times per day for the first 1-2 days.  If directed, apply heat to the affected area as often as told by your health care provider. Use the heat source that your health care provider recommends, such as a moist heat pack or a heating pad. ? Place a towel between your skin and the heat source. ? Leave the heat on for 20-30 minutes. ? Remove the heat if your skin turns bright red. This is especially important if you are unable to feel pain, heat, or cold. You may have a greater risk of getting burned. General instructions  Take over-the-counter and prescription medicines only as told by your health care provider.  To prevent or treat constipation or painful bowel movements, your health care provider may recommend that you: ? Drink enough fluid to keep your urine pale yellow. ? Eat foods that are high in fiber, such as fresh fruits and vegetables, whole grains, and beans. ? Limit foods that are high in fat and processed sugars, such as fried and sweet foods. ? Take an over-the-counter or prescription medicine for constipation.  Keep all follow-up visits as   directed by your health care provider. This is important. Contact a health care provider if:  Your pain becomes worse or is not controlled with medicine.  Your bowel movements cause a great deal of discomfort.  You are unable to have a bowel movement after 4 days.  You have pain during intercourse. Summary  A tailbone injury may involve stretched ligaments, bruising, or a broken bone (fracture).  Tailbone  injuries can be painful. Most heal on their own in 4-6 weeks.  Treatment may include taking NSAIDs, using a rubber or inflated ring or cushion when sitting, and physical therapy.  Follow any recommendations from your health care provider to prevent or treat constipation. This information is not intended to replace advice given to you by your health care provider. Make sure you discuss any questions you have with your health care provider. Document Revised: 11/22/2017 Document Reviewed: 11/22/2017 Elsevier Patient Education  2020 Elsevier Inc.  

## 2019-12-31 NOTE — Progress Notes (Signed)
Subjective:    Patient ID: Norma Barajas, female    DOB: Nov 30, 1940, 79 y.o.   MRN: [id number]  Chief Complaint  Patient presents with  . Fall    hurt left pelvis    Pt presents to the office today with pain in her tailbone after a fall Friday morning. She reports her pain is a 10 out 10 when  she sits. Has no pain with walking or sitting. She does take warfarin for A Fib, but denies any bleeding or bruising. She denies hitting her head.   She does have mild right knee and ankle pain, because when she fell her knee slightly twisted. No bruising noted. Fall The accident occurred 3 to 5 days ago. She fell from a height of 3 to 5 ft. She landed on concrete. There was no blood loss. Point of impact: left hip. The pain is present in the left hip. The pain is at a severity of 10/10. The pain is moderate. The symptoms are aggravated by pressure on injury. Pertinent negatives include no bowel incontinence, fever, headaches, hearing loss, hematuria or loss of consciousness. She has tried rest and acetaminophen for the symptoms. The treatment provided mild relief.      Review of Systems  Constitutional: Negative for fever.  Gastrointestinal: Negative for bowel incontinence.  Genitourinary: Negative for hematuria.  Neurological: Negative for loss of consciousness and headaches.  All other systems reviewed and are negative.      Objective:   Physical Exam Vitals reviewed.  Constitutional:      General: She is not in acute distress.    Appearance: She is well-developed.  HENT:     Head: Normocephalic and atraumatic.  Eyes:     Pupils: Pupils are equal, round, and reactive to light.  Neck:     Thyroid: No thyromegaly.  Cardiovascular:     Rate and Rhythm: Normal rate and regular rhythm.     Heart sounds: Normal heart sounds. No murmur.  Pulmonary:     Effort: Pulmonary effort is normal. No respiratory distress.     Breath sounds: Normal breath sounds. No wheezing.  Abdominal:   General: Bowel sounds are normal. There is no distension.     Palpations: Abdomen is soft.     Tenderness: There is no abdominal tenderness.  Musculoskeletal:        General: No tenderness. Normal range of motion.     Cervical back: Normal range of motion and neck supple.  Skin:    General: Skin is warm and dry.  Neurological:     Mental Status: She is alert and oriented to person, place, and time.     Cranial Nerves: No cranial nerve deficit.     Deep Tendon Reflexes: Reflexes are normal and symmetric.  Psychiatric:        Behavior: Behavior normal.        Thought Content: Thought content normal.        Judgment: Judgment normal.      BP (!) 146/83   Pulse 77   Temp (!) 97.3 F (36.3 C) (Temporal)   Wt 126 lb 9.6 oz (57.4 kg)   SpO2 97%   BMI 23.92 kg/m       Assessment & Plan:  Norma Barajas comes in today with chief complaint of Fall (hurt left pelvis )   Diagnosis and orders addressed:  1. Fall, initial encounter - DG HIP UNILAT W OR W/O PELVIS 2-3 VIEWS LEFT; Future - DG Sacrum/Coccyx;  Future  2. Coccyx pain - DG Sacrum/Coccyx; Future  3. Acute pain of left knee   4. Atrial fibrillation, unspecified type ([place])   Pt can not take Motrin, because taking warfarin. Continue Tylenol. Keep INR follow up.   Rest Fall preventions discussed Donut pillow   Evelina Dun, FNP

## 2020-01-01 ENCOUNTER — Ambulatory Visit: Payer: Medicare HMO | Admitting: Family Medicine

## 2020-01-07 ENCOUNTER — Other Ambulatory Visit: Payer: Self-pay

## 2020-01-07 NOTE — Progress Notes (Signed)
Assessment & Plan:  1. Paroxysmal atrial fibrillation (HCC)/Chronic anticoagulation Description   Continue taking warfarin 1.5 mg on Sunday, Tuesday, Thursday and 3 mg on Monday, Wednesday, Friday and Saturday.  INR was 2.0 today (goal is 2.0 to 3.0). Next INR in 6 weeks.     - CoaguChek XS/INR Waived  3. Sprain of left ankle, unspecified ligament, subsequent encounter - Exercises provided to complete for ankle sprain. Continue Tylenol as needed.  - predniSONE (STERAPRED UNI-PAK 21 TAB) 10 MG (21) TBPK tablet; As directed x 6 days  Dispense: 21 tablet; Refill: 0   Return in about 6 weeks (around 02/19/2020) for INR.  [name], MSN, APRN, FNP-C [facility]  Subjective:    Patient ID: Norma Barajas, female    DOB: Feb 28, 1941, 79 y.o.   MRN: [id number]  Patient Care Team: Loman Brooklyn, FNP as PCP - General (Family Medicine) Minus Breeding, MD as PCP - Cardiology (Cardiology) [name], MD as Attending Physician (Gastroenterology) Melina Schools, OD as Consulting Physician (Optometry)   Chief Complaint:  Chief Complaint  Patient presents with  . Coagulation Disorder    HPI: Norma Barajas is a 79 y.o. female presenting on 01/08/2020 for Coagulation Disorder  Anticoagulation: Patient here for anticoagulation monitoring. Indication:atrial fibrillation Bleeding Signs/Symptoms:None Thromboembolic Signs/Symptoms:None  Missed Coumadin Doses:None Medication Changes:None Dietary Changes:None Bacterial/Viral Infection:None   New complaints: Patient was seen on 12/31/2019 by another NP in the office after a fall. Hip and sacrum/coccyx x-rays were negative. Patient reports she is still having pain in her left ankle. She rolled it when she was falling. She reports she tripped over a cat that got tangled in her feet out on the pavement. She has been taking Tylenol.   Social history:  Relevant past medical, surgical, family and social  history reviewed and updated as indicated. Interim medical history since our last visit reviewed.  Allergies and medications reviewed and updated.  DATA REVIEWED: CHART IN EPIC  ROS: Negative unless specifically indicated above in HPI.    Current Outpatient Medications:  .  amLODipine (NORVASC) 10 MG tablet, Take 1 tablet (10 mg total) by mouth daily., Disp: 90 tablet, Rfl: 3 .  Calcium Carbonate-Vitamin D (CALCIUM-VITAMIN D3 PO), Take 1 tablet by mouth daily., Disp: , Rfl:  .  cholecalciferol (VITAMIN D3) 25 MCG (1000 UT) tablet, Take 1,000 Units by mouth daily., Disp: , Rfl:  .  meclizine (ANTIVERT) 25 MG tablet, Take 1 tablet (25 mg total) by mouth 3 (three) times daily as needed for dizziness., Disp: 30 tablet, Rfl: 2 .  metoprolol tartrate (LOPRESSOR) 25 MG tablet, Take 0.5 tablets (12.5 mg total) by mouth 2 (two) times daily., Disp: 90 tablet, Rfl: 2 .  Multiple Vitamin (MULTIVITAMIN) capsule, Take 1 capsule by mouth every evening., Disp: , Rfl:  .  Omega-3 Fatty Acids (FISH OIL) 1000 MG CAPS, Take 2,000 mg by mouth 2 (two) times daily., Disp: , Rfl:  .  omeprazole (PRILOSEC) 20 MG capsule, Take 1 capsule (20 mg total) by mouth daily., Disp: 90 capsule, Rfl: 3 .  simvastatin (ZOCOR) 40 MG tablet, Take 1 tablet (40 mg total) by mouth daily., Disp: 90 tablet, Rfl: 2 .  sotalol (BETAPACE) 80 MG tablet, Take 1 tablet (80 mg total) by mouth daily., Disp: 90 tablet, Rfl: 1 .  warfarin (COUMADIN) 3 MG tablet, TAKE 1/2 TO 1 TABLET EVERY DAY AS DIRECTED  BY  ANTICOAGULATION  CLINIC, Disp: 90 tablet, Rfl: 1 .  predniSONE (STERAPRED UNI-PAK 21 TAB) 10 MG (21) TBPK tablet, As directed x 6 days, Disp: 21 tablet, Rfl: 0   Allergies  Allergen Reactions  . Codeine Nausea And Vomiting  . Triamterene-Hctz Other (See Comments)    weakness  . Atorvastatin Other (See Comments)    Myalgias   . Crestor [Rosuvastatin Calcium] Other (See Comments)    weakness  . Morphine Nausea Only  . Risedronate  Sodium Other (See Comments)    ACTONEL REACTION: reflux   Past Medical History:  Diagnosis Date  . 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 Anxiety   . Arthritis    "right leg" (04/11/2015)  . BPPV (benign paroxysmal positional vertigo) 03/23/2016  . Cataract   . Coronary atherosclerosis of native coronary artery    a. Nonobstructive minimal CAD 10/2005.  Marland Kitchen Depression   . Diastolic dysfunction    Grade 1. Ejection fraction 60-65%.  Marland Kitchen Dysrhythmia    a fib  . Erosive esophagitis   . Essential hypertension   . GERD (gastroesophageal reflux disease)   . Grade III hemorrhoids   . History of hiatal hernia   . Hypercholesterolemia   . Internal hemorrhoids with complication [id number]   OCT 2015 FLEX SIG/IH BANDING    . Migraine    "used to have them right bad; I don't now" (04/11/2015)  . MITRAL REGURGITATION 04/27/2010   Qualifier: Diagnosis of  By: [name], MD, [name]   . Osteoporosis   . Paroxysmal atrial fibrillation (HCC)   . PONV (postoperative nausea and vomiting)   . Vertigo     Past Surgical History:  Procedure Laterality Date  . BRAVO [place] STUDY  11/15/2012   Procedure: BRAVO [place];  Surgeon: [name], MD;  Location: [facility];  Service: Endoscopy;;  . CATARACT EXTRACTION W/ INTRAOCULAR LENS  IMPLANT, BILATERAL Bilateral   . COLONOSCOPY  2008   Dr. [name]: internal hemorrhoids   . DILATION AND CURETTAGE OF UTERUS    . ESOPHAGOGASTRODUODENOSCOPY (EGD) WITH ESOPHAGEAL DILATION  2001   Dr. [name]: erosive esophagitis, esophageal stricture, duodenitis, s/p Savary dilation  . ESOPHAGOGASTRODUODENOSCOPY (EGD) WITH ESOPHAGEAL DILATION  11/15/2012   [id number] web was found & MOST LIKELY CAUSE FOR DYAPHAGIA/Polyp was found in the gastric body and gastric fundus/ gastritis on bx  . EYE SURGERY Bilateral    "laser OR after cataract OR; cause I couldn't see"  . FLEXIBLE SIGMOIDOSCOPY N/A 08/22/2014   mild diverticulosis in sigmoid, moderate sized  Grade 3 hemorrhoids s/p banding X 3.   Marland Kitchen FRACTURE SURGERY Right    below the knee - 2 bones broke has plates  . HEMORRHOID BANDING N/A 08/22/2014   Procedure: HEMORRHOID BANDING;  Surgeon: [name], MD;  Location: [facility];  Service: Endoscopy;  Laterality: N/A;  . HEMORRHOID SURGERY N/A 03/30/2018   Procedure: EXTENSIVE HEMORRHOIDECTOMY;  Surgeon: [name], MD;  Location: [facility] ORS;  Service: General;  Laterality: N/A;  . OPEN REDUCTION INTERNAL FIXATION (ORIF) TIBIA/FIBULA FRACTURE Right 2013   broke tibia and fibula after falling down stairs  . PROLAPSED UTERINE FIBROID LIGATION  2015  . TOTAL ABDOMINAL HYSTERECTOMY      Social History   Socioeconomic History  . Marital status: Married    Spouse name: george   . Number of children: 1  . Years of education: Not on file  . Highest education level: Not on file  Occupational History  . Occupation: Retired  Comment: Textile  Tobacco Use  . Smoking status: Never Smoker  . Smokeless tobacco: Never Used  Substance and Sexual Activity  . Alcohol use: No    Alcohol/week: 0.0 standard drinks  . Drug use: No  . Sexual activity: Not Currently    Birth control/protection: Surgical, Post-menopausal    Comment: hyst  Other Topics Concern  . Not on file  Social History Narrative   Married   No regular exercise   Social Determinants of Health   Financial Resource Strain:   . Difficulty of Paying Living Expenses: Not on file  Food Insecurity:   . Worried About Charity fundraiser in the Last Year: Not on file  . Ran Out of Food in the Last Year: Not on file  Transportation Needs:   . Lack of Transportation (Medical): Not on file  . Lack of Transportation (Non-Medical): Not on file  Physical Activity:   . Days of Exercise per Week: Not on file  . Minutes of Exercise per Session: Not on file  Stress:   . Feeling of Stress : Not on file  Social Connections:   . Frequency of Communication with Friends and Family:  Not on file  . Frequency of Social Gatherings with Friends and Family: Not on file  . Attends Religious Services: Not on file  . Active Member of Clubs or Organizations: Not on file  . Attends Archivist Meetings: Not on file  . Marital Status: Not on file  Intimate Partner Violence:   . Fear of Current or Ex-Partner: Not on file  . Emotionally Abused: Not on file  . Physically Abused: Not on file  . Sexually Abused: Not on file        Objective:    BP 138/87   Pulse 82   Temp 97.8 F (36.6 C) (Temporal)   Ht 5\' 1"  (1.549 m)   Wt 124 lb 12.8 oz (56.6 kg)   SpO2 92%   BMI 23.58 kg/m   Physical Exam Vitals reviewed.  Constitutional:      General: She is not in acute distress.    Appearance: Normal appearance. She is normal weight. She is not ill-appearing, toxic-appearing or diaphoretic.  HENT:     Head: Normocephalic and atraumatic.  Eyes:     General: No scleral icterus.       Right eye: No discharge.        Left eye: No discharge.     Conjunctiva/sclera: Conjunctivae normal.  Cardiovascular:     Rate and Rhythm: Normal rate and regular rhythm.     Heart sounds: Normal heart sounds. No murmur. No friction rub. No gallop.   Pulmonary:     Effort: Pulmonary effort is normal. No respiratory distress.     Breath sounds: Normal breath sounds. No stridor. No wheezing, rhonchi or rales.  Musculoskeletal:        General: Normal range of motion.     Cervical back: Normal range of motion.     Left ankle: Swelling present. No deformity, ecchymosis or lacerations. Normal range of motion.  Skin:    General: Skin is warm and dry.     Capillary Refill: Capillary refill takes less than 2 seconds.  Neurological:     General: No focal deficit present.     Mental Status: She is alert and oriented to person, place, and time. Mental status is at baseline.  Psychiatric:        Mood and Affect: Mood normal.  Behavior: Behavior normal.        Thought Content: Thought  content normal.        Judgment: Judgment normal.     Lab Results  Component Value Date   TSH 1.483 04/10/2015   Lab Results  Component Value Date   WBC 6.0 12/05/2019   HGB 15.7 12/05/2019   HCT 47.1 (H) 12/05/2019   MCV 95 12/05/2019   PLT 181 12/05/2019   Lab Results  Component Value Date   NA 141 12/05/2019   K 3.6 12/05/2019   CO2 29 12/05/2019   GLUCOSE 93 12/05/2019   BUN 9 12/05/2019   CREATININE 0.66 12/05/2019   BILITOT 0.3 12/05/2019   ALKPHOS 86 12/05/2019   AST 22 12/05/2019   ALT 14 12/05/2019   PROT 7.5 12/05/2019   ALBUMIN 4.6 12/05/2019   CALCIUM 9.1 12/05/2019   ANIONGAP 13 07/10/2019   Lab Results  Component Value Date   CHOL 186 12/05/2019   Lab Results  Component Value Date   HDL 48 12/05/2019   Lab Results  Component Value Date   LDLCALC 98 12/05/2019   Lab Results  Component Value Date   TRIG 235 (H) 12/05/2019   Lab Results  Component Value Date   CHOLHDL 3.9 12/05/2019   Lab Results  Component Value Date   HGBA1C 5.6 02/28/2017

## 2020-01-08 ENCOUNTER — Ambulatory Visit (INDEPENDENT_AMBULATORY_CARE_PROVIDER_SITE_OTHER): Payer: Medicare HMO | Admitting: Family Medicine

## 2020-01-08 ENCOUNTER — Encounter: Payer: Self-pay | Admitting: Family Medicine

## 2020-01-08 ENCOUNTER — Other Ambulatory Visit: Payer: Self-pay

## 2020-01-08 VITALS — BP 138/87 | HR 82 | Temp 97.8°F | Ht 61.0 in | Wt 124.8 lb

## 2020-01-08 DIAGNOSIS — Z7901 Long term (current) use of anticoagulants: Secondary | ICD-10-CM

## 2020-01-08 DIAGNOSIS — I480 Paroxysmal atrial fibrillation: Secondary | ICD-10-CM

## 2020-01-08 DIAGNOSIS — S93402D Sprain of unspecified ligament of left ankle, subsequent encounter: Secondary | ICD-10-CM

## 2020-01-08 LAB — COAGUCHEK XS/INR WAIVED
INR: 2 — ABNORMAL HIGH (ref 0.9–1.1)
Prothrombin Time: 23.9 s

## 2020-01-08 MED ORDER — SIMVASTATIN 40 MG PO TABS: 40.0000 mg | ORAL_TABLET | Freq: Every day | ORAL | 2 refills | Status: DC

## 2020-01-08 MED ORDER — PREDNISONE 10 MG (21) PO TBPK: ORAL_TABLET | ORAL | 0 refills | Status: DC

## 2020-01-08 MED ORDER — METOPROLOL TARTRATE 25 MG PO TABS: 12.5000 mg | ORAL_TABLET | Freq: Two times a day (BID) | ORAL | 2 refills | Status: DC

## 2020-01-08 MED ORDER — SOTALOL HCL 80 MG PO TABS: 80.0000 mg | ORAL_TABLET | Freq: Every day | ORAL | 1 refills | Status: DC

## 2020-01-08 NOTE — Patient Instructions (Signed)
Ankle Sprain, Phase I Rehab An ankle sprain is an injury to the ligaments of your ankle. Ankle sprains cause stiffness, loss of motion, and loss of strength. Ask your health care provider which exercises are safe for you. Do exercises exactly as told by your health care provider and adjust them as directed. It is normal to feel mild stretching, pulling, tightness, or discomfort as you do these exercises. Stop right away if you feel sudden pain or your pain gets worse. Do not begin these exercises until told by your health care provider. Stretching and range-of-motion exercises These exercises warm up your muscles and joints and improve the movement and flexibility of your lower leg and ankle. These exercises also help to relieve pain and stiffness. Gastroc and soleus stretch This exercise is also called a calf stretch. It stretches the muscles in the back of the lower leg. These muscles are the gastrocnemius, or gastroc, and the soleus. 1. Sit on the floor with your left / right leg extended. 2. Loop a belt or towel around the ball of your left / right foot. The ball of your foot is on the walking surface, right under your toes. 3. Keep your left / right ankle and foot relaxed and keep your knee straight while you use the belt or towel to pull your foot toward you. You should feel a gentle stretch behind your calf or knee in your gastroc muscle. 4. Hold this position for __________ seconds, then release to the starting position. 5. Repeat the exercise with your knee bent. You can put a pillow or a rolled bath towel under your knee to support it. You should feel a stretch deep in your calf in the soleus muscle or at your Achilles tendon. Repeat __________ times. Complete this exercise __________ times a day. Ankle alphabet  1. Sit with your left / right leg supported at the lower leg. ? Do not rest your foot on anything. ? Make sure your foot has room to move freely. 2. Think of your left / right  foot as a paintbrush. ? Move your foot to trace each letter of the alphabet in the air. Keep your hip and knee still while you trace. ? Make the letters as large as you can without feeling discomfort. 3. Trace every letter from A to Z. Repeat __________ times. Complete this exercise __________ times a day. Strengthening exercises These exercises build strength and endurance in your ankle and lower leg. Endurance is the ability to use your muscles for a long time, even after they get tired. Ankle dorsiflexion  1. Secure a rubber exercise band or tube to an object, such as a table leg, that will stay still when the band is pulled. Secure the other end around your left / right foot. 2. Sit on the floor facing the object, with your left / right leg extended. The band or tube should be slightly tense when your foot is relaxed. 3. Slowly bring your foot toward you, bringing the top of your foot toward your shin (dorsiflexion), and pulling the band tighter. 4. Hold this position for __________ seconds. 5. Slowly return your foot to the starting position. Repeat __________ times. Complete this exercise __________ times a day. Ankle plantar flexion  1. Sit on the floor with your left / right leg extended. 2. Loop a rubber exercise tube or band around the ball of your left / right foot. The ball of your foot is on the walking surface, right under your   toes. ? Hold the ends of the band or tube in your hands. ? The band or tube should be slightly tense when your foot is relaxed. 3. Slowly point your foot and toes downward to tilt the top of your foot away from your shin (plantar flexion). 4. Hold this position for __________ seconds. 5. Slowly return your foot to the starting position. Repeat __________ times. Complete this exercise __________ times a day. Ankle eversion 1. Sit on the floor with your legs straight out in front of you. 2. Loop a rubber exercise band or tube around the ball of your left  / right foot. The ball of your foot is on the walking surface, right under your toes. ? Hold the ends of the band in your hands, or secure the band to a stable object. ? The band or tube should be slightly tense when your foot is relaxed. 3. Slowly push your foot outward, away from your other leg (eversion). 4. Hold this position for __________ seconds. 5. Slowly return your foot to the starting position. Repeat __________ times. Complete this exercise __________ times a day. This information is not intended to replace advice given to you by your health care provider. Make sure you discuss any questions you have with your health care provider. Document Revised: 02/13/2019 Document Reviewed: 08/07/2018 Elsevier Patient Education  2020 Elsevier Inc.  

## 2020-01-14 ENCOUNTER — Ambulatory Visit: Payer: Medicare HMO | Attending: Internal Medicine

## 2020-01-14 DIAGNOSIS — Z23 Encounter for immunization: Secondary | ICD-10-CM

## 2020-01-14 NOTE — Progress Notes (Signed)
   Covid-19 Vaccination Clinic  Name:  Norma Barajas    MRN: [id number] DOB: Aug 01, 1941  01/14/2020  Norma Barajas was observed post Covid-19 immunization for 15 minutes without incident. She was provided with Vaccine Information Sheet and instruction to access the V-Safe system.   Norma Barajas was instructed to call 911 with any severe reactions post vaccine: Marland Kitchen Difficulty breathing  . Swelling of face and throat  . A fast heartbeat  . A bad rash all over body  . Dizziness and weakness   Immunizations Administered    Name Date Dose VIS Date Route   Moderna COVID-19 Vaccine 01/14/2020 12:52 PM 0.5 mL 10/09/2019 Intramuscular   Manufacturer: Moderna   Lot: [id number]   [id number]

## 2020-01-16 ENCOUNTER — Telehealth: Payer: Self-pay | Admitting: Cardiology

## 2020-01-16 ENCOUNTER — Other Ambulatory Visit: Payer: Self-pay

## 2020-01-16 ENCOUNTER — Ambulatory Visit: Payer: Medicare HMO | Admitting: Cardiology

## 2020-01-16 ENCOUNTER — Other Ambulatory Visit: Payer: Self-pay | Admitting: *Deleted

## 2020-01-16 ENCOUNTER — Encounter: Payer: Self-pay | Admitting: Cardiology

## 2020-01-16 VITALS — BP 112/80 | HR 126 | Ht 61.0 in | Wt 126.0 lb

## 2020-01-16 DIAGNOSIS — I480 Paroxysmal atrial fibrillation: Secondary | ICD-10-CM

## 2020-01-16 MED ORDER — METOPROLOL TARTRATE 25 MG PO TABS: 25.0000 mg | ORAL_TABLET | Freq: Two times a day (BID) | ORAL | 3 refills | Status: DC

## 2020-01-16 NOTE — Telephone Encounter (Signed)
New Message  Patient c/o Palpitations:  High priority if patient c/o lightheadedness, shortness of breath, or chest pain  1) How long have you had palpitations/irregular HR/ Afib? Are you having the symptoms now? No, but when she gets up and moves around she goes into AFIB  2) Are you currently experiencing lightheadedness, SOB or CP? No  3) Do you have a history of afib (atrial fibrillation) or irregular heart rhythm? Yes  4) Have you checked your BP or HR? (document readings if available): N/A  5) Are you experiencing any other symptoms? Feels her heart racing when she is up and moving around.

## 2020-01-16 NOTE — Progress Notes (Signed)
Cardiology Office Note   Date:  01/16/2020   ID:  Najja, Kaul [birth year], MRN [id number]  PCP:  Loman Brooklyn, FNP  Cardiologist:   Minus Breeding, MD   Chief Complaint  Patient presents with  . Palpitations       History of Present Illness: Norma Barajas is a 79 y.o. female who presents for follow up of atrial fibrillation.  She has been seen by Dr. [name] and Dr. Curt Bears.  I saw her last year.    She did have an essentially normal echo in 2017.  She was treated with Tikosyn for a while but could not afford this.  She has been treated with Sotalol.    She called today because she was having increasing tachypalpitations.  She was added to the schedule.. She said that this is been going on since last Thursday.  She will feel her heart racing when she gets up and does something.  Usually goes back into regular rhythm on its own.  The patient denies any new symptoms such as chest discomfort, neck or arm discomfort. There has been no new shortness of breath, PND or orthopnea. There have been no reported palpitations, presyncope or syncope.   Past Medical History:  Diagnosis Date  . 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 Anxiety   . Arthritis    "right leg" (04/11/2015)  . BPPV (benign paroxysmal positional vertigo) 03/23/2016  . Cataract   . Coronary atherosclerosis of native coronary artery    a. Nonobstructive minimal CAD 10/2005.  Marland Kitchen Depression   . Diastolic dysfunction    Grade 1. Ejection fraction 60-65%.  Marland Kitchen Dysrhythmia    a fib  . Erosive esophagitis   . Essential hypertension   . GERD (gastroesophageal reflux disease)   . Grade III hemorrhoids   . History of hiatal hernia   . Hypercholesterolemia   . Internal hemorrhoids with complication 123XX123   OCT 2015 FLEX SIG/IH BANDING    . Migraine    "used to have them right bad; I don't now" (04/11/2015)  . MITRAL REGURGITATION 04/27/2010   Qualifier: Diagnosis of  By: [name], MD, [name]   .  Osteoporosis   . Paroxysmal atrial fibrillation (HCC)   . PONV (postoperative nausea and vomiting)   . Vertigo     Past Surgical History:  Procedure Laterality Date  . BRAVO [place] STUDY  11/15/2012   Procedure: BRAVO [place];  Surgeon: [name], MD;  [facility];  Service: Endoscopy;;  . CATARACT EXTRACTION W/ INTRAOCULAR LENS  IMPLANT, BILATERAL Bilateral   . COLONOSCOPY  2008   Dr. [name]: internal hemorrhoids   . DILATION AND CURETTAGE OF UTERUS    . ESOPHAGOGASTRODUODENOSCOPY (EGD) WITH ESOPHAGEAL DILATION  2001   Dr. [name]: erosive esophagitis, esophageal stricture, duodenitis, s/p Savary dilation  . ESOPHAGOGASTRODUODENOSCOPY (EGD) WITH ESOPHAGEAL DILATION  11/15/2012   [id number] web was found & MOST LIKELY CAUSE FOR DYAPHAGIA/Polyp was found in the gastric body and gastric fundus/ gastritis on bx  . EYE SURGERY Bilateral    "laser OR after cataract OR; cause I couldn't see"  . FLEXIBLE SIGMOIDOSCOPY N/A 08/22/2014   mild diverticulosis in sigmoid, moderate sized Grade 3 hemorrhoids s/p banding X 3.   Marland Kitchen FRACTURE SURGERY Right    below the knee - 2 bones broke has plates  . HEMORRHOID BANDING N/A 08/22/2014   Procedure: HEMORRHOID BANDING;  Surgeon: [name], MD;  Location[facility];  Service: Endoscopy;  Laterality: N/A;  . HEMORRHOID SURGERY N/A 03/30/2018   Procedure: EXTENSIVE HEMORRHOIDECTOMY;  Surgeon: [name], MD;  Location: [facility] ORS;  Service: General;  Laterality: N/A;  . OPEN REDUCTION INTERNAL FIXATION (ORIF) TIBIA/FIBULA FRACTURE Right 2013   broke tibia and fibula after falling down stairs  . PROLAPSED UTERINE FIBROID LIGATION  2015  . TOTAL ABDOMINAL HYSTERECTOMY       Current Outpatient Medications  Medication Sig Dispense Refill  . amLODipine (NORVASC) 10 MG tablet Take 1 tablet (10 mg total) by mouth daily. 90 tablet 3  . Calcium Carbonate-Vitamin D (CALCIUM-VITAMIN D3 PO) Take 1 tablet by mouth daily.    .  cholecalciferol (VITAMIN D3) 25 MCG (1000 UT) tablet Take 1,000 Units by mouth daily.    . meclizine (ANTIVERT) 25 MG tablet Take 1 tablet (25 mg total) by mouth 3 (three) times daily as needed for dizziness. 30 tablet 2  . metoprolol tartrate (LOPRESSOR) 25 MG tablet Take 1 tablet (25 mg total) by mouth 2 (two) times daily. 180 tablet 3  . Multiple Vitamin (MULTIVITAMIN) capsule Take 1 capsule by mouth every evening.    . Omega-3 Fatty Acids (FISH OIL) 1000 MG CAPS Take 2,000 mg by mouth 2 (two) times daily.    Marland Kitchen omeprazole (PRILOSEC) 20 MG capsule Take 1 capsule (20 mg total) by mouth daily. 90 capsule 3  . simvastatin (ZOCOR) 40 MG tablet Take 1 tablet (40 mg total) by mouth daily. 90 tablet 2  . sotalol (BETAPACE) 80 MG tablet Take 1 tablet (80 mg total) by mouth daily. 90 tablet 1  . warfarin (COUMADIN) 3 MG tablet TAKE 1/2 TO 1 TABLET EVERY DAY AS DIRECTED  BY  ANTICOAGULATION  CLINIC 90 tablet 1   No current facility-administered medications for this visit.    Allergies:   Codeine, Triamterene-hctz, Atorvastatin, Crestor [rosuvastatin calcium], Morphine, and Risedronate sodium    ROS:  Please see the history of present illness.   Otherwise, review of systems are positive for none.   All other systems are reviewed and negative.    PHYSICAL EXAM: VS:  BP 112/80   Pulse (!) 126   Ht 5\' 1"  (1.549 m)   Wt 126 lb (57.2 kg)   BMI 23.81 kg/m  , BMI Body mass index is 23.81 kg/m. GENERAL:  Well appearing NECK:  No jugular venous distention, waveform within normal limits, carotid upstroke brisk and symmetric, no bruits, no thyromegaly LUNGS:  Clear to auscultation bilaterally CHEST:  Unremarkable HEART:  PMI not displaced or sustained,S1 and S2 within normal limits, no S3, no S4, no clicks, no rubs, no murmurs ABD:  Flat, positive bowel sounds normal in frequency in pitch, no bruits, no rebound, no guarding, no midline pulsatile mass, no hepatomegaly, no splenomegaly EXT:  2 plus pulses  throughout, no edema, no cyanosis no clubbing   EKG:  EKG is  ordered today. The ekg ordered today demonstrates sinus rhythm, rate 126, axis within normal limits.  QT prolonged.    Recent Labs: 12/05/2019: ALT 14; BUN 9; Creatinine, Ser 0.66; Hemoglobin 15.7; Platelets 181; Potassium 3.6; Sodium 141    Lipid Panel    Component Value Date/Time   CHOL 186 12/05/2019 0815   CHOL 192 05/15/2013 0829   TRIG 235 (H) 12/05/2019 0815   TRIG 258 (H) 03/19/2014 0812   TRIG 179 (H) 05/15/2013 0829   HDL 48 12/05/2019 0815   HDL 45 03/19/2014 [id number]  HDL 44 05/15/2013 0829   CHOLHDL 3.9 12/05/2019 0815   CHOLHDL 3.9 02/10/2016 0208   VLDL 33 02/10/2016 0208   LDLCALC 98 12/05/2019 0815   LDLCALC 101 (H) 03/19/2014 0812   LDLCALC 112 (H) 05/15/2013 0829      Wt Readings from Last 3 Encounters:  01/16/20 126 lb (57.2 kg)  01/08/20 124 lb 12.8 oz (56.6 kg)  12/31/19 126 lb 9.6 oz (57.4 kg)      Other studies Reviewed: Additional studies/ records that were reviewed today include: Hospital records from 2017.  Review of the above records demonstrates:  See below.    ASSESSMENT AND PLAN:  ATRIAL FIBRILLATION:      She is in atrial fibrillation as above.  She has failed amiodarone in the past.  She could not afford Tikosyn.  We cannot increase her sotalol with her QT.  I am going to increase her beta-blocker.  If however in a couple of days she is not back in sinus and her heart rate is still increased she will need cardioversion and she would agree to this.  I do note that she has had 2 therapeutic INRs in a row but they have been low normal.  I have asked her to take an extra half of her Coumadin dose tomorrow and on Sunday when she usually takes 1/2 g.    HTN: Her blood pressure is controlled.  No change in therapy.   COVID EDUCATION: She has received both of her vaccines.   Current medicines are reviewed at length with the patient today.  The patient does not have concerns regarding  medicines.  The following changes have been made:  As above Labs/ tests ordered today include:  None  Orders Placed This Encounter  Procedures  . EKG 12-Lead     Disposition:   FU with me in 1 month.     Signed, Minus Breeding, MD  01/16/2020 1:00 PM    [place]

## 2020-01-16 NOTE — Telephone Encounter (Signed)
Spoke with pt and since last Thursday has noted with activity HR goes up but if just sitting it is okay Attempted to make pt an appt with Afib clinic and pt declined said she is unable to get to [place]. Will forward to Dr Percival Spanish for review and also tried to make next available appt with Dr Percival Spanish in [place] and trying to reach pt and just get busy signal and mobile number does not have voice mail set up /cy

## 2020-01-16 NOTE — Patient Instructions (Addendum)
Medication Instructions:  Please increase Metoprolol Tartrate to 25 mg twice a day. Please take extra 1/2 tablet of warfarin on Sundays and Thursdays. Continue all other medications as listed.  *If you need a refill on your cardiac medications before your next appointment, please call your pharmacy*  Follow-Up: At [facility], you and your health needs are our priority.  As part of our continuing mission to provide you with exceptional heart care, we have created designated Provider Care Teams.  These Care Teams include your primary Cardiologist (physician) and Advanced Practice Providers (APPs -  Physician Assistants and Nurse Practitioners) who all work together to provide you with the care you need, when you need it.  We recommend signing up for the patient portal called "MyChart".  Sign up information is provided on this After Visit Summary.  MyChart is used to connect with patients for Virtual Visits (Telemedicine).  Patients are able to view lab/test results, encounter notes, upcoming appointments, etc.  Non-urgent messages can be sent to your provider as well.   To learn more about what you can do with MyChart, go to [email or web address].    Your next appointment:   1 month(s)  The format for your next appointment:   In Person  Provider:   Minus Breeding, MD   Thank you for choosing [facility]!!     Pulse oximeter

## 2020-01-16 NOTE — Telephone Encounter (Signed)
Had a cancellation on our [place] schedule today.  Pt has been added on at [facility]N for further evaluation.

## 2020-01-22 ENCOUNTER — Telehealth: Payer: Self-pay | Admitting: Family Medicine

## 2020-01-22 NOTE — Chronic Care Management (AMB) (Signed)
  Chronic Care Management   Note  01/22/2020 Name: Norma Barajas MRN: [id number] DOB: Aug 29, 1941  Norma Barajas is a 79 y.o. year old female who is a primary care patient of Loman Brooklyn, FNP. I reached out to [employer] by phone today in response to a referral sent by Norma Barajas's health plan.     Norma Barajas was given information about Chronic Care Management services today including:  1. CCM service includes personalized support from designated clinical staff supervised by her physician, including individualized plan of care and coordination with other care providers 2. 24/7 contact phone numbers for assistance for urgent and routine care needs. 3. Service will only be billed when office clinical staff spend 20 minutes or more in a month to coordinate care. 4. Only one practitioner may furnish and bill the service in a calendar month. 5. The patient may stop CCM services at any time (effective at the end of the month) by phone call to the office staff. 6. The patient will be responsible for cost sharing (co-pay) of up to 20% of the service fee (after annual deductible is met).  Patient did not agree to enrollment in care management services and does not wish to consider at this time.  Follow up plan: The patient has been provided with contact information for the care management team and has been advised to call with any health related questions or concerns.   [place], [place] [address] Direct Dial: [phone] [email or web address]: [email or web address]

## 2020-01-25 ENCOUNTER — Other Ambulatory Visit: Payer: Medicare HMO

## 2020-01-25 ENCOUNTER — Ambulatory Visit: Payer: Medicare HMO | Admitting: Family Medicine

## 2020-02-19 ENCOUNTER — Encounter: Payer: Self-pay | Admitting: Family Medicine

## 2020-02-19 ENCOUNTER — Other Ambulatory Visit: Payer: Self-pay

## 2020-02-19 ENCOUNTER — Ambulatory Visit: Payer: Medicare HMO

## 2020-02-19 ENCOUNTER — Ambulatory Visit (INDEPENDENT_AMBULATORY_CARE_PROVIDER_SITE_OTHER): Payer: Medicare HMO | Admitting: Family Medicine

## 2020-02-19 VITALS — BP 136/75 | HR 67 | Temp 97.8°F | Wt 124.0 lb

## 2020-02-19 DIAGNOSIS — I4891 Unspecified atrial fibrillation: Secondary | ICD-10-CM

## 2020-02-19 DIAGNOSIS — Z7901 Long term (current) use of anticoagulants: Secondary | ICD-10-CM

## 2020-02-19 DIAGNOSIS — M79675 Pain in left toe(s): Secondary | ICD-10-CM | POA: Diagnosis not present

## 2020-02-19 LAB — COAGUCHEK XS/INR WAIVED
INR: 2.3 — ABNORMAL HIGH (ref 0.9–1.1)
Prothrombin Time: 27.4 s

## 2020-02-19 NOTE — Progress Notes (Signed)
Assessment & Plan:  1-2. Atrial fibrillation, unspecified type (HCC)/Chronic anticoagulation Description   Continue taking warfarin 1.5 mg on Sunday, Tuesday, Thursday and 3 mg on Monday, Wednesday, Friday and Saturday.  INR was 2.3 today (goal is 2.0 to 3.0). Next INR in 6 weeks.     - CoaguChek XS/INR Waived  3. Pain of toe of left foot - Cyst of toe. Encouraged to cover with corn pad to protect and keep from enlarging.    Return in about 6 weeks (around 04/01/2020) for INR.  [name], MSN, APRN, FNP-C [facility]  Subjective:    Patient ID: Norma Barajas, female    DOB: Apr 02, 1941, 79 y.o.   MRN: [id number]  Patient Care Team: Loman Brooklyn, FNP as PCP - General (Family Medicine) Minus Breeding, MD as PCP - Cardiology (Cardiology) [name], MD as Attending Physician (Gastroenterology) Melina Schools, OD as Consulting Physician (Optometry)   Chief Complaint:  Chief Complaint  Patient presents with  . Coagulation Disorder    HPI: Norma Barajas is a 79 y.o. female presenting on 02/19/2020 for Coagulation Disorder  Anticoagulation: Patient here for anticoagulation monitoring. Indication:atrial fibrillation Bleeding Signs/Symptoms:None Thromboembolic Signs/Symptoms:None  Missed Coumadin Doses:None Medication Changes:None Dietary Changes:None Bacterial/Viral Infection:None  New complaints: A sore spot on the inside of her second toe on the left foot. States it is sore to walk on. She noticed it ~1 month ago. No known injury.   Social history:  Relevant past medical, surgical, family and social history reviewed and updated as indicated. Interim medical history since our last visit reviewed.  Allergies and medications reviewed and updated.  DATA REVIEWED: CHART IN EPIC  ROS: Negative unless specifically indicated above in HPI.    Current Outpatient Medications:  .  amLODipine (NORVASC) 10 MG tablet, Take 1  tablet (10 mg total) by mouth daily., Disp: 90 tablet, Rfl: 3 .  Calcium Carbonate-Vitamin D (CALCIUM-VITAMIN D3 PO), Take 1 tablet by mouth daily., Disp: , Rfl:  .  cholecalciferol (VITAMIN D3) 25 MCG (1000 UT) tablet, Take 1,000 Units by mouth daily., Disp: , Rfl:  .  meclizine (ANTIVERT) 25 MG tablet, Take 1 tablet (25 mg total) by mouth 3 (three) times daily as needed for dizziness., Disp: 30 tablet, Rfl: 2 .  metoprolol tartrate (LOPRESSOR) 25 MG tablet, Take 1 tablet (25 mg total) by mouth 2 (two) times daily., Disp: 180 tablet, Rfl: 3 .  Multiple Vitamin (MULTIVITAMIN) capsule, Take 1 capsule by mouth every evening., Disp: , Rfl:  .  Omega-3 Fatty Acids (FISH OIL) 1000 MG CAPS, Take 2,000 mg by mouth 2 (two) times daily., Disp: , Rfl:  .  omeprazole (PRILOSEC) 20 MG capsule, Take 1 capsule (20 mg total) by mouth daily., Disp: 90 capsule, Rfl: 3 .  simvastatin (ZOCOR) 40 MG tablet, Take 1 tablet (40 mg total) by mouth daily., Disp: 90 tablet, Rfl: 2 .  sotalol (BETAPACE) 80 MG tablet, Take 1 tablet (80 mg total) by mouth daily., Disp: 90 tablet, Rfl: 1 .  warfarin (COUMADIN) 3 MG tablet, TAKE 1/2 TO 1 TABLET EVERY DAY AS DIRECTED  BY  ANTICOAGULATION  CLINIC, Disp: 90 tablet, Rfl: 1   Allergies  Allergen Reactions  . Codeine Nausea And Vomiting  . Triamterene-Hctz Other (See Comments)    weakness  . Atorvastatin Other (See Comments)    Myalgias   . Crestor [Rosuvastatin Calcium] Other (See Comments)    weakness  . Morphine Nausea Only  .  Risedronate Sodium Other (See Comments)    ACTONEL REACTION: reflux   Past Medical History:  Diagnosis Date  . 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 Anxiety   . Arthritis    "right leg" (04/11/2015)  . BPPV (benign paroxysmal positional vertigo) 03/23/2016  . Cataract   . Coronary atherosclerosis of native coronary artery    a. Nonobstructive minimal CAD 10/2005.  Marland Kitchen Depression   . Diastolic dysfunction    Grade 1. Ejection  fraction 60-65%.  Marland Kitchen Dysrhythmia    a fib  . Erosive esophagitis   . Essential hypertension   . GERD (gastroesophageal reflux disease)   . Grade III hemorrhoids   . History of hiatal hernia   . Hypercholesterolemia   . Internal hemorrhoids with complication 123XX123   OCT 2015 FLEX SIG/IH BANDING    . Migraine    "used to have them right bad; I don't now" (04/11/2015)  . MITRAL REGURGITATION 04/27/2010   Qualifier: Diagnosis of  By: [name], MD, [name]   . Osteoporosis   . Paroxysmal atrial fibrillation (HCC)   . PONV (postoperative nausea and vomiting)   . Vertigo     Past Surgical History:  Procedure Laterality Date  . BRAVO [place] STUDY  11/15/2012   Procedure: BRAVO [place];  Surgeon: [name], MD;  [facility];  Service: Endoscopy;;  . CATARACT EXTRACTION W/ INTRAOCULAR LENS  IMPLANT, BILATERAL Bilateral   . COLONOSCOPY  2008   Dr. [name]: internal hemorrhoids   . DILATION AND CURETTAGE OF UTERUS    . ESOPHAGOGASTRODUODENOSCOPY (EGD) WITH ESOPHAGEAL DILATION  2001   Dr. [name]: erosive esophagitis, esophageal stricture, duodenitis, s/p Savary dilation  . ESOPHAGOGASTRODUODENOSCOPY (EGD) WITH ESOPHAGEAL DILATION  11/15/2012   [id number] web was found & MOST LIKELY CAUSE FOR DYAPHAGIA/Polyp was found in the gastric body and gastric fundus/ gastritis on bx  . EYE SURGERY Bilateral    "laser OR after cataract OR; cause I couldn't see"  . FLEXIBLE SIGMOIDOSCOPY N/A 08/22/2014   mild diverticulosis in sigmoid, moderate sized Grade 3 hemorrhoids s/p banding X 3.   Marland Kitchen FRACTURE SURGERY Right    below the knee - 2 bones broke has plates  . HEMORRHOID BANDING N/A 08/22/2014   Procedure: HEMORRHOID BANDING;  Surgeon: [name], MD;  [facility];  Service: Endoscopy;  Laterality: N/A;  . HEMORRHOID SURGERY N/A 03/30/2018   Procedure: EXTENSIVE HEMORRHOIDECTOMY;  Surgeon: [name], MD;  Location: [facility] ORS;  Service: General;  Laterality:  N/A;  . OPEN REDUCTION INTERNAL FIXATION (ORIF) TIBIA/FIBULA FRACTURE Right 2013   broke tibia and fibula after falling down stairs  . PROLAPSED UTERINE FIBROID LIGATION  2015  . TOTAL ABDOMINAL HYSTERECTOMY      Social History   Socioeconomic History  . Marital status: Married    Spouse name: george   . Number of children: 1  . Years of education: Not on file  . Highest education level: Not on file  Occupational History  . Occupation: Retired    Comment: Textile  Tobacco Use  . Smoking status: Never Smoker  . Smokeless tobacco: Never Used  Substance and Sexual Activity  . Alcohol use: No    Alcohol/week: 0.0 standard drinks  . Drug use: No  . Sexual activity: Not Currently    Birth control/protection: Surgical, Post-menopausal    Comment: hyst  Other Topics Concern  . Not on file  Social History Narrative  Married   No regular exercise   Social Determinants of Health   Financial Resource Strain:   . Difficulty of Paying Living Expenses:   Food Insecurity:   . Worried About Charity fundraiser in the Last Year:   . Arboriculturist in the Last Year:   Transportation Needs:   . Film/video editor (Medical):   Marland Kitchen Lack of Transportation (Non-Medical):   Physical Activity:   . Days of Exercise per Week:   . Minutes of Exercise per Session:   Stress:   . Feeling of Stress :   Social Connections:   . Frequency of Communication with Friends and Family:   . Frequency of Social Gatherings with Friends and Family:   . Attends Religious Services:   . Active Member of Clubs or Organizations:   . Attends Archivist Meetings:   Marland Kitchen Marital Status:   Intimate Partner Violence:   . Fear of Current or Ex-Partner:   . Emotionally Abused:   Marland Kitchen Physically Abused:   . Sexually Abused:         Objective:    BP 136/75   Pulse 67   Temp 97.8 F (36.6 C) (Temporal)   Wt 124 lb (56.2 kg)   SpO2 90%   BMI 23.43 kg/m   Wt Readings from Last 3 Encounters:   02/19/20 124 lb (56.2 kg)  01/16/20 126 lb (57.2 kg)  01/08/20 124 lb 12.8 oz (56.6 kg)    Physical Exam Vitals reviewed.  Constitutional:      General: She is not in acute distress.    Appearance: Normal appearance. She is not ill-appearing, toxic-appearing or diaphoretic.  HENT:     Head: Normocephalic and atraumatic.  Eyes:     General: No scleral icterus.       Right eye: No discharge.        Left eye: No discharge.     Conjunctiva/sclera: Conjunctivae normal.  Cardiovascular:     Rate and Rhythm: Normal rate.  Pulmonary:     Effort: Pulmonary effort is normal. No respiratory distress.  Musculoskeletal:        General: Normal range of motion.     Cervical back: Normal range of motion.  Feet:     Comments: Cyst to inside of 2nd toe on left foot.  Skin:    General: Skin is warm and dry.     Capillary Refill: Capillary refill takes less than 2 seconds.  Neurological:     General: No focal deficit present.     Mental Status: She is alert and oriented to person, place, and time. Mental status is at baseline.  Psychiatric:        Mood and Affect: Mood normal.        Behavior: Behavior normal.        Thought Content: Thought content normal.        Judgment: Judgment normal.     Lab Results  Component Value Date   TSH 1.483 04/10/2015   Lab Results  Component Value Date   WBC 6.0 12/05/2019   HGB 15.7 12/05/2019   HCT 47.1 (H) 12/05/2019   MCV 95 12/05/2019   PLT 181 12/05/2019   Lab Results  Component Value Date   NA 141 12/05/2019   K 3.6 12/05/2019   CO2 29 12/05/2019   GLUCOSE 93 12/05/2019   BUN 9 12/05/2019   CREATININE 0.66 12/05/2019   BILITOT 0.3 12/05/2019   ALKPHOS 86 12/05/2019  AST 22 12/05/2019   ALT 14 12/05/2019   PROT 7.5 12/05/2019   ALBUMIN 4.6 12/05/2019   CALCIUM 9.1 12/05/2019   ANIONGAP 13 07/10/2019   Lab Results  Component Value Date   CHOL 186 12/05/2019   Lab Results  Component Value Date   HDL 48 12/05/2019   Lab  Results  Component Value Date   LDLCALC 98 12/05/2019   Lab Results  Component Value Date   TRIG 235 (H) 12/05/2019   Lab Results  Component Value Date   CHOLHDL 3.9 12/05/2019   Lab Results  Component Value Date   HGBA1C 5.6 02/28/2017

## 2020-02-20 ENCOUNTER — Encounter: Payer: Self-pay | Admitting: Family Medicine

## 2020-02-20 ENCOUNTER — Ambulatory Visit (INDEPENDENT_AMBULATORY_CARE_PROVIDER_SITE_OTHER): Payer: Medicare HMO | Admitting: Family Medicine

## 2020-02-20 VITALS — BP 135/81 | HR 69 | Temp 97.7°F | Resp 20 | Ht 61.0 in | Wt 126.0 lb

## 2020-02-20 DIAGNOSIS — M79671 Pain in right foot: Secondary | ICD-10-CM

## 2020-02-20 NOTE — Progress Notes (Signed)
Subjective:  Patient ID: Norma Barajas, female    DOB: [birth year]  Age: 79 y.o. MRN: [id number]  CC: Lesion on toe   HPI Norma Barajas presents for painful eruption between two toes.   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   She has a past surgical history that includes Total abdominal hysterectomy; Prolapsed uterine fibroid ligation (2015); Cataract extraction w/ intraocular lens  implant, bilateral (Bilateral); Open reduction internal fixation (orif) tibia/fibula fracture (Right, 2013); Esophagogastroduodenoscopy (egd) with esophageal dilation (2001); Colonoscopy (2008); Esophagogastroduodenoscopy (egd) with esophageal dilation (11/15/2012); BRAVO ph study (11/15/2012); Flexible sigmoidoscopy (N/A, 08/22/2014); Hemorrhoid banding (N/A, 08/22/2014); Eye surgery (Bilateral); Dilation and curettage of uterus; Fracture surgery (Right); and Hemorrhoid surgery (N/A, 03/30/2018).    Her family history includes Arthritis in her sister; Colon cancer (age of onset: 82) in her mother; Dementia in her sister; Diabetes in her sister and son; Heart disease in her brother, father, mother, and sister; Hip fracture in her mother; Hyperlipidemia in her brother; Hypertension in her brother; Osteoporosis in her mother and sister; Stroke in her brother, sister, sister, and son; Uterine cancer in her sister.She reports that she has never smoked. She has never used smokeless tobacco. She reports that she does not drink alcohol or use drugs.    ROS Review of Systems noncontributory Objective:  BP 135/81   Pulse 69   Temp 97.7 F (36.5 C) (Temporal)   Resp 20   Ht 5\' 1"  (1.549 m)   Wt 126 lb (57.2 kg)   SpO2 97%   BMI 23.81 kg/m   BP Readings from Last 3 Encounters:  02/20/20 135/81  02/19/20 136/75  01/16/20 112/80    Wt Readings from Last 3 Encounters:  02/20/20 126 lb (57.2 kg)  02/19/20 124 lb (56.2 kg)  01/16/20 126 lb (57.2 kg)     Physical Exam  Callus forming at base of right fourth toewith mild surrounding hyperemia.  Assessment & Plan:   Norma Barajas was seen today for lesion on toe.  Diagnoses and all orders for this visit:  Right foot pain -     Ambulatory referral to Podiatry       I am having Norma Barajas maintain her multivitamin, Calcium Carbonate-Vitamin D (CALCIUM-VITAMIN D3 PO), cholecalciferol, Fish Oil, omeprazole, amLODipine, meclizine, warfarin, simvastatin, sotalol, and metoprolol tartrate.  Allergies as of 02/20/2020      Reactions   Codeine Nausea And Vomiting   Triamterene-hctz Other (See Comments)   weakness   Atorvastatin Other (See Comments)   Myalgias  Crestor [rosuvastatin Calcium] Other (See Comments)   weakness   Morphine Nausea Only   Risedronate Sodium Other (See Comments)   ACTONEL REACTION: reflux      Medication List       Accurate as of February 20, 2020 10:16 PM. If you have any questions, ask your nurse or  doctor.        amLODipine 10 MG tablet Commonly known as: NORVASC Take 1 tablet (10 mg total) by mouth daily.   CALCIUM-VITAMIN D3 PO Take 1 tablet by mouth daily.   cholecalciferol 25 MCG (1000 UNIT) tablet Commonly known as: VITAMIN D3 Take 1,000 Units by mouth daily.   Fish Oil 1000 MG Caps Take 2,000 mg by mouth 2 (two) times daily.   meclizine 25 MG tablet Commonly known as: ANTIVERT Take 1 tablet (25 mg total) by mouth 3 (three) times daily as needed for dizziness.   metoprolol tartrate 25 MG tablet Commonly known as: LOPRESSOR Take 1 tablet (25 mg total) by mouth 2 (two) times daily.   multivitamin capsule Take 1 capsule by mouth every evening.   omeprazole 20 MG capsule Commonly known as: PRILOSEC Take 1 capsule (20 mg total) by mouth daily.   simvastatin 40 MG tablet Commonly known as: ZOCOR Take 1 tablet (40 mg total) by mouth daily.   sotalol 80 MG tablet Commonly known as: BETAPACE Take 1 tablet (80 mg total) by mouth daily.   warfarin 3 MG tablet Commonly known as: COUMADIN Take as directed by the anticoagulation clinic. If you are unsure how to take this medication, talk to your nurse or doctor. Original instructions: TAKE 1/2 TO 1 TABLET EVERY DAY AS DIRECTED  BY  ANTICOAGULATION  CLINIC        Follow-up: Return if symptoms worsen or fail to improve.  Norma Barajas, M.D.

## 2020-02-21 ENCOUNTER — Telehealth: Payer: Self-pay | Admitting: Family Medicine

## 2020-02-21 NOTE — Telephone Encounter (Signed)
Pt called back stating that she was not waiting until 03/06/20 to see Dr Irving Shows about her toe. Tried explaining to pt that this appt was the first available that Dr Drucilla Chalet office had. Pt said she knows there is a foot doctor is [place] and [place] and will go to one of them if she can be seen sooner. Does not want to go to [facility].

## 2020-02-22 NOTE — Telephone Encounter (Signed)
It looks like [facility] placed this referral. If you need me to place another, please let me know. Not sure she is going to get in anywhere else sooner.

## 2020-02-26 DIAGNOSIS — Z7189 Other specified counseling: Secondary | ICD-10-CM | POA: Insufficient documentation

## 2020-02-26 NOTE — Telephone Encounter (Signed)
Spoke with patient made her aware Dr. Irving Shows was the first available.

## 2020-02-26 NOTE — Progress Notes (Signed)
Cardiology Office Note   Date:  02/27/2020   ID:  Sunya, Schardt [birth year], MRN [id number]  PCP:  Loman Brooklyn, FNP  Cardiologist:   Minus Breeding, MD   No chief complaint on file.      History of Present Illness: Norma Barajas is a 79 y.o. female who presents for follow up of atrial fibrillation.  She has been seen by Dr. [name] and Dr. Curt Bears.  She did have an essentially normal echo in 2017.  She was treated with Tikosyn for a while but could not afford this.  She has been treated with Sotalol.  At her last she was having increased palpitations and at the last visit I increased her beta blocker.   She says that since I saw her she has done better.  She has not had any symptomatic atrial fibrillation that she knows about.  She has not been having any palpitations, presyncope or syncope.  She denies any chest pressure, neck or arm discomfort.  She just fatigue tries to do stuff.   Past Medical History:  Diagnosis Date  . Acoustic neuroma ([place]) 02/18/2011   Right ear   . Anxiety   . Arthritis    "right leg" (04/11/2015)  . BPPV (benign paroxysmal positional vertigo) 03/23/2016  . Cataract   . Coronary atherosclerosis of native coronary artery    a. Nonobstructive minimal CAD 10/2005.  Marland Kitchen Depression   . Diastolic dysfunction    Grade 1. Ejection fraction 60-65%.  Marland Kitchen Dysrhythmia    a fib  . Erosive esophagitis   . Essential hypertension   . GERD (gastroesophageal reflux disease)   . Grade III hemorrhoids   . History of hiatal hernia   . Hypercholesterolemia   . Internal hemorrhoids with complication 123XX123   OCT 2015 FLEX SIG/IH BANDING    . Migraine    "used to have them right bad; I don't now" (04/11/2015)  . MITRAL REGURGITATION 04/27/2010   Qualifier: Diagnosis of  By: [name], MD, [name]   . Osteoporosis   . Paroxysmal atrial fibrillation (HCC)   . PONV (postoperative nausea and vomiting)   . Vertigo     Past Surgical History:  Procedure  Laterality Date  . BRAVO [place] STUDY  11/15/2012   Procedure: BRAVO [place];  Surgeon: [name], MD;  Location: [facility] END[facility] SU[facility];  Service: Endoscopy;;  . CATARACT EXTRACTION W/ INTRAOCULAR LENS  IMPLANT, BILATERAL Bilateral   . COLONOSCOPY  2008   Dr. [name]: internal hemorrhoids   . DILATION AND CURETTAGE OF UTERUS    . ESOPHAGOGASTRODUODENOSCOPY (EGD) WITH ESOPHAGEAL DILATION  2001   Dr. [name]: erosive esophagitis, esophageal stricture, duodenitis, s/p Savary dilation  . ESOPHAGOGASTRODUODENOSCOPY (EGD) WITH ESOPHAGEAL DILATION  11/15/2012   [id number] web was found & MOST LIKELY CAUSE FOR DYAPHAGIA/Polyp was found in the gastric body and gastric fundus/ gastritis on bx  . EYE SURGERY Bilateral    "laser OR after cataract OR; cause I couldn't see"  . FLEXIBLE SIGMOIDOSCOPY N/A 08/22/2014   mild diverticulosis in sigmoid, moderate sized Grade 3 hemorrhoids s/p banding X 3.   Marland Kitchen FRACTURE SURGERY Right    below the knee - 2 bones broke has plates  . HEMORRHOID BANDING N/A 08/22/2014   Procedure: HEMORRHOID BANDING;  Surgeon: [name], MD;  Location: [facility];  Service: Endoscopy;  Laterality: N/A;  . HEMORRHOID SURGERY N/A 03/30/2018   Procedure: EXTENSIVE HEMORRHOIDECTOMY;  Surgeon: [name], MD;  Location: [facility] ORS;  Service: General;  Laterality: N/A;  . OPEN REDUCTION INTERNAL FIXATION (ORIF) TIBIA/FIBULA FRACTURE Right 2013   broke tibia and fibula after falling down stairs  . PROLAPSED UTERINE FIBROID LIGATION  2015  . TOTAL ABDOMINAL HYSTERECTOMY       Current Outpatient Medications  Medication Sig Dispense Refill  . amLODipine (NORVASC) 10 MG tablet Take 1 tablet (10 mg total) by mouth daily. 90 tablet 3  . Calcium Carbonate-Vitamin D (CALCIUM-VITAMIN D3 PO) Take 1 tablet by mouth daily.    . cholecalciferol (VITAMIN D3) 25 MCG (1000 UT) tablet Take 1,000 Units by mouth daily.    . metoprolol tartrate (LOPRESSOR) 25 MG tablet Take 1 tablet (25 mg total) by  mouth 2 (two) times daily. 180 tablet 3  . Multiple Vitamin (MULTIVITAMIN) capsule Take 1 capsule by mouth every evening.    . Omega-3 Fatty Acids (FISH OIL) 1000 MG CAPS Take 2,000 mg by mouth 2 (two) times daily.    Marland Kitchen omeprazole (PRILOSEC) 20 MG capsule Take 1 capsule (20 mg total) by mouth daily. 90 capsule 3  . simvastatin (ZOCOR) 40 MG tablet Take 1 tablet (40 mg total) by mouth daily. 90 tablet 2  . sotalol (BETAPACE) 80 MG tablet Take 1 tablet (80 mg total) by mouth daily. 90 tablet 1  . warfarin (COUMADIN) 3 MG tablet TAKE 1/2 TO 1 TABLET EVERY DAY AS DIRECTED  BY  ANTICOAGULATION  CLINIC 90 tablet 1   No current facility-administered medications for this visit.    Allergies:   Codeine, Triamterene-hctz, Atorvastatin, Crestor [rosuvastatin calcium], Morphine, and Risedronate sodium    ROS:  Please see the history of present illness.   Otherwise, review of systems are positive for none.   All other systems are reviewed and negative.    PHYSICAL EXAM: VS:  BP 138/82   Pulse 68   Ht 5\' 1"  (1.549 m)   Wt 124 lb (56.2 kg)   BMI 23.43 kg/m  , BMI Body mass index is 23.43 kg/m. GENERAL:  Well appearing NECK:  No jugular venous distention, waveform within normal limits, carotid upstroke brisk and symmetric, no bruits, no thyromegaly LUNGS:  Clear to auscultation bilaterally CHEST:  Unremarkable HEART:  PMI not displaced or sustained,S1 and S2 within normal limits, no S3, no S4, no clicks, no rubs, no murmurs ABD:  Flat, positive bowel sounds normal in frequency in pitch, no bruits, no rebound, no guarding, no midline pulsatile mass, no hepatomegaly, no splenomegaly EXT:  2 plus pulses throughout, no edema, no cyanosis no clubbing   EKG:  EKG is not ordered today.   Recent Labs: 12/05/2019: ALT 14; BUN 9; Creatinine, Ser 0.66; Hemoglobin 15.7; Platelets 181; Potassium 3.6; Sodium 141    Lipid Panel    Component Value Date/Time   CHOL 186 12/05/2019 0815   CHOL 192  05/15/2013 0829   TRIG 235 (H) 12/05/2019 0815   TRIG 258 (H) 03/19/2014 0812   TRIG 179 (H) 05/15/2013 0829   HDL 48 12/05/2019 0815   HDL 45 03/19/2014 0812   HDL 44 05/15/2013 0829   CHOLHDL 3.9 12/05/2019 0815   CHOLHDL 3.9 02/10/2016 0208   VLDL 33 02/10/2016 0208   LDLCALC 98 12/05/2019 0815   LDLCALC 101 (H) 03/19/2014 0812   LDLCALC 112 (H) 05/15/2013 0829      Wt Readings from Last 3 Encounters:  02/27/20 124 lb (56.2 kg)  02/20/20 126 lb (57.2 kg)  02/19/20 124 lb (56.2 kg)  Other studies Reviewed: Additional studies/ records that were reviewed today include: None Review of the above records demonstrates:  NA   ASSESSMENT AND PLAN:  ATRIAL FIBRILLATION:      She is in atrial fibrillation as above.  She has failed amiodarone in the past.  She could not afford Tikosyn.  We cannot increase her sotalol with her QT.   The increase beta-blocker seems to have worked.  No change in therapy.  We could titrate upward in the future if she has any further tachypalpitations.   HTN: Her blood pressure is controlled.  No change in therapy.   COVID EDUCATION: She has received both of her vaccines.   Current medicines are reviewed at length with the patient today.  The patient does not have concerns regarding medicines.  The following changes have been made:  None  Labs/ tests ordered today include:  None  No orders of the defined types were placed in this encounter.    Disposition:   FU with me in 12 month.     Signed, Minus Breeding, MD  02/27/2020 12:16 PM    [place]

## 2020-02-27 ENCOUNTER — Ambulatory Visit: Payer: Medicare HMO | Admitting: Cardiology

## 2020-02-27 ENCOUNTER — Other Ambulatory Visit: Payer: Self-pay

## 2020-02-27 ENCOUNTER — Encounter: Payer: Self-pay | Admitting: Cardiology

## 2020-02-27 VITALS — BP 138/82 | HR 68 | Ht 61.0 in | Wt 124.0 lb

## 2020-02-27 DIAGNOSIS — Z7189 Other specified counseling: Secondary | ICD-10-CM | POA: Diagnosis not present

## 2020-02-27 DIAGNOSIS — I4819 Other persistent atrial fibrillation: Secondary | ICD-10-CM

## 2020-02-27 DIAGNOSIS — I10 Essential (primary) hypertension: Secondary | ICD-10-CM | POA: Diagnosis not present

## 2020-02-27 NOTE — Patient Instructions (Signed)
Medication Instructions:  The current medical regimen is effective;  continue present plan and medications.  *If you need a refill on your cardiac medications before your next appointment, please call your pharmacy*  Follow-Up: At [facility], you and your health needs are our priority.  As part of our continuing mission to provide you with exceptional heart care, we have created designated Provider Care Teams.  These Care Teams include your primary Cardiologist (physician) and Advanced Practice Providers (APPs -  Physician Assistants and Nurse Practitioners) who all work together to provide you with the care you need, when you need it.  We recommend signing up for the patient portal called "MyChart".  Sign up information is provided on this After Visit Summary.  MyChart is used to connect with patients for Virtual Visits (Telemedicine).  Patients are able to view lab/test results, encounter notes, upcoming appointments, etc.  Non-urgent messages can be sent to your provider as well.   To learn more about what you can do with MyChart, go to [email or web address].    Your next appointment:   12 month(s)  The format for your next appointment:   In Person  Provider:   James Hochrein, MD   Thank you for choosing [facility]!!     

## 2020-04-02 ENCOUNTER — Encounter: Payer: Self-pay | Admitting: Family Medicine

## 2020-04-02 ENCOUNTER — Ambulatory Visit (INDEPENDENT_AMBULATORY_CARE_PROVIDER_SITE_OTHER): Payer: Medicare HMO | Admitting: Family Medicine

## 2020-04-02 ENCOUNTER — Other Ambulatory Visit: Payer: Medicare HMO

## 2020-04-02 ENCOUNTER — Other Ambulatory Visit: Payer: Self-pay

## 2020-04-02 DIAGNOSIS — I4891 Unspecified atrial fibrillation: Secondary | ICD-10-CM

## 2020-04-02 DIAGNOSIS — Z7901 Long term (current) use of anticoagulants: Secondary | ICD-10-CM | POA: Diagnosis not present

## 2020-04-02 LAB — COAGUCHEK XS/INR WAIVED
INR: 2.6 — ABNORMAL HIGH (ref 0.9–1.1)
Prothrombin Time: 31 s

## 2020-04-02 NOTE — Progress Notes (Signed)
Assessment & Plan:  1-2. Atrial fibrillation, unspecified type (HCC)/Chronic anticoagulation Description   Continue taking warfarin 1.5 mg on Tuesday and 3 mg on all other days.   INR was 2.6 today (goal is 2.0 to 3.0). Next INR in 6 weeks.     - CoaguChek XS/INR Waived   Return in about 6 weeks (around 05/14/2020) for INR.  [name], MSN, APRN, FNP-C [facility]  Subjective:    Patient ID: CAIRI Norma Barajas, female    DOB: 12/13/1940, 79 y.o.   MRN: [id number]  Patient Care Team: Loman Brooklyn, FNP as PCP - General (Family Medicine) Minus Breeding, MD as PCP - Cardiology (Cardiology) [name], MD (Inactive) as Attending Physician (Gastroenterology) Melina Schools, OD as Consulting Physician (Optometry)   Chief Complaint:  Chief Complaint  Patient presents with  . Coagulation Disorder    HPI: Norma Barajas is a 79 y.o. female presenting on 04/02/2020 for Coagulation Disorder  Anticoagulation: Patient here for anticoagulation monitoring. Indication: atrial fibrillation Bleeding Signs/Symptoms:  None Thromboembolic Signs/Symptoms:  None  Missed Coumadin Doses:  None Medication Changes:  no Dietary Changes:  no Bacterial/Viral Infection:  no   New complaints: None  Social history:  Relevant past medical, surgical, family and social history reviewed and updated as indicated. Interim medical history since our last visit reviewed.  Allergies and medications reviewed and updated.  DATA REVIEWED: CHART IN EPIC  ROS: Negative unless specifically indicated above in HPI.    Current Outpatient Medications:  .  amLODipine (NORVASC) 10 MG tablet, Take 1 tablet (10 mg total) by mouth daily., Disp: 90 tablet, Rfl: 3 .  Calcium Carbonate-Vitamin D (CALCIUM-VITAMIN D3 PO), Take 1 tablet by mouth daily., Disp: , Rfl:  .  cholecalciferol (VITAMIN D3) 25 MCG (1000 UT) tablet, Take 1,000 Units by mouth daily., Disp: , Rfl:  .  metoprolol tartrate  (LOPRESSOR) 25 MG tablet, Take 1 tablet (25 mg total) by mouth 2 (two) times daily., Disp: 180 tablet, Rfl: 3 .  Multiple Vitamin (MULTIVITAMIN) capsule, Take 1 capsule by mouth every evening., Disp: , Rfl:  .  Omega-3 Fatty Acids (FISH OIL) 1000 MG CAPS, Take 2,000 mg by mouth 2 (two) times daily., Disp: , Rfl:  .  omeprazole (PRILOSEC) 20 MG capsule, Take 1 capsule (20 mg total) by mouth daily., Disp: 90 capsule, Rfl: 3 .  simvastatin (ZOCOR) 40 MG tablet, Take 1 tablet (40 mg total) by mouth daily., Disp: 90 tablet, Rfl: 2 .  sotalol (BETAPACE) 80 MG tablet, Take 1 tablet (80 mg total) by mouth daily., Disp: 90 tablet, Rfl: 1 .  warfarin (COUMADIN) 3 MG tablet, TAKE 1/2 TO 1 TABLET EVERY DAY AS DIRECTED  BY  ANTICOAGULATION  CLINIC, Disp: 90 tablet, Rfl: 1   Allergies  Allergen Reactions  . Codeine Nausea And Vomiting  . Triamterene-Hctz Other (See Comments)    weakness  . Atorvastatin Other (See Comments)    Myalgias   . Crestor [Rosuvastatin Calcium] Other (See Comments)    weakness  . Morphine Nausea Only  . Risedronate Sodium Other (See Comments)    ACTONEL REACTION: reflux   Past Medical History:  Diagnosis Date  . 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 Anxiety   . Arthritis    "right leg" (04/11/2015)  . BPPV (benign paroxysmal positional vertigo) 03/23/2016  . Cataract   . Coronary atherosclerosis of native coronary artery    a. Nonobstructive minimal CAD  10/2005.  Marland Kitchen Depression   . Diastolic dysfunction    Grade 1. Ejection fraction 60-65%.  Marland Kitchen Dysrhythmia    a fib  . Erosive esophagitis   . Essential hypertension   . GERD (gastroesophageal reflux disease)   . Grade III hemorrhoids   . History of hiatal hernia   . Hypercholesterolemia   . Internal hemorrhoids with complication [id number]   OCT 2015 FLEX SIG/IH BANDING    . Migraine    "used to have them right bad; I don't now" (04/11/2015)  . MITRAL REGURGITATION 04/27/2010   Qualifier: Diagnosis of  By:  [name], MD, [name]   . Osteoporosis   . Paroxysmal atrial fibrillation (HCC)   . PONV (postoperative nausea and vomiting)   . Vertigo     Past Surgical History:  Procedure Laterality Date  . BRAVO [place] STUDY  11/15/2012   Procedure: BRAVO [place];  Surgeon: [name], MD;  Location: [facility];  Service: Endoscopy;;  . CATARACT EXTRACTION W/ INTRAOCULAR LENS  IMPLANT, BILATERAL Bilateral   . COLONOSCOPY  2008   Dr. [name]: internal hemorrhoids   . DILATION AND CURETTAGE OF UTERUS    . ESOPHAGOGASTRODUODENOSCOPY (EGD) WITH ESOPHAGEAL DILATION  2001   Dr. [name]: erosive esophagitis, esophageal stricture, duodenitis, s/p Savary dilation  . ESOPHAGOGASTRODUODENOSCOPY (EGD) WITH ESOPHAGEAL DILATION  11/15/2012   [id number] web was found & MOST LIKELY CAUSE FOR DYAPHAGIA/Polyp was found in the gastric body and gastric fundus/ gastritis on bx  . EYE SURGERY Bilateral    "laser OR after cataract OR; cause I couldn't see"  . FLEXIBLE SIGMOIDOSCOPY N/A 08/22/2014   mild diverticulosis in sigmoid, moderate sized Grade 3 hemorrhoids s/p banding X 3.   Marland Kitchen FRACTURE SURGERY Right    below the knee - 2 bones broke has plates  . HEMORRHOID BANDING N/A 08/22/2014   Procedure: HEMORRHOID BANDING;  Surgeon: [name], MD;  Location: [facility];  Service: Endoscopy;  Laterality: N/A;  . HEMORRHOID SURGERY N/A 03/30/2018   Procedure: EXTENSIVE HEMORRHOIDECTOMY;  Surgeon: [name], MD;  Location: [facility] ORS;  Service: General;  Laterality: N/A;  . OPEN REDUCTION INTERNAL FIXATION (ORIF) TIBIA/FIBULA FRACTURE Right 2013   broke tibia and fibula after falling down stairs  . PROLAPSED UTERINE FIBROID LIGATION  2015  . TOTAL ABDOMINAL HYSTERECTOMY      Social History   Socioeconomic History  . Marital status: Married    Spouse name: george   . Number of children: 1  . Years of education: Not on file  . Highest education level: Not on file  Occupational History  .  Occupation: Retired    Comment: Textile  Tobacco Use  . Smoking status: Never Smoker  . Smokeless tobacco: Never Used  Substance and Sexual Activity  . Alcohol use: No    Alcohol/week: 0.0 standard drinks  . Drug use: No  . Sexual activity: Not Currently    Birth control/protection: Surgical, Post-menopausal    Comment: hyst  Other Topics Concern  . Not on file  Social History Narrative   Married   No regular exercise   Social Determinants of Health   Financial Resource Strain:   . Difficulty of Paying Living Expenses:   Food Insecurity:   . Worried About Charity fundraiser in the Last Year:   . Arboriculturist in the Last Year:   Transportation Needs:   . Film/video editor (Medical):   Marland Kitchen Lack of Transportation (  Non-Medical):   Physical Activity:   . Days of Exercise per Week:   . Minutes of Exercise per Session:   Stress:   . Feeling of Stress :   Social Connections:   . Frequency of Communication with Friends and Family:   . Frequency of Social Gatherings with Friends and Family:   . Attends Religious Services:   . Active Member of Clubs or Organizations:   . Attends Archivist Meetings:   Marland Kitchen Marital Status:   Intimate Partner Violence:   . Fear of Current or Ex-Partner:   . Emotionally Abused:   Marland Kitchen Physically Abused:   . Sexually Abused:         Objective:    BP 131/81   Pulse 87   Temp (!) 97.2 F (36.2 C) (Temporal)   Ht 5\' 1"  (1.549 m)   Wt 128 lb 3.2 oz (58.2 kg)   BMI 24.22 kg/m   Wt Readings from Last 3 Encounters:  04/02/20 128 lb 3.2 oz (58.2 kg)  02/27/20 124 lb (56.2 kg)  02/20/20 126 lb (57.2 kg)    Physical Exam Vitals reviewed.  Constitutional:      General: She is not in acute distress.    Appearance: Normal appearance. She is normal weight. She is not ill-appearing, toxic-appearing or diaphoretic.  HENT:     Head: Normocephalic and atraumatic.  Eyes:     General: No scleral icterus.       Right eye: No  discharge.        Left eye: No discharge.     Conjunctiva/sclera: Conjunctivae normal.  Cardiovascular:     Rate and Rhythm: Normal rate. Rhythm irregular.     Heart sounds: Normal heart sounds. No murmur. No friction rub. No gallop.   Pulmonary:     Effort: Pulmonary effort is normal. No respiratory distress.     Breath sounds: Normal breath sounds. No stridor. No wheezing, rhonchi or rales.  Musculoskeletal:        General: Normal range of motion.     Cervical back: Normal range of motion.  Skin:    General: Skin is warm and dry.     Capillary Refill: Capillary refill takes less than 2 seconds.  Neurological:     General: No focal deficit present.     Mental Status: She is alert and oriented to person, place, and time. Mental status is at baseline.  Psychiatric:        Mood and Affect: Mood normal.        Behavior: Behavior normal.        Thought Content: Thought content normal.        Judgment: Judgment normal.     Lab Results  Component Value Date   TSH 1.483 04/10/2015   Lab Results  Component Value Date   WBC 6.0 12/05/2019   HGB 15.7 12/05/2019   HCT 47.1 (H) 12/05/2019   MCV 95 12/05/2019   PLT 181 12/05/2019   Lab Results  Component Value Date   NA 141 12/05/2019   K 3.6 12/05/2019   CO2 29 12/05/2019   GLUCOSE 93 12/05/2019   BUN 9 12/05/2019   CREATININE 0.66 12/05/2019   BILITOT 0.3 12/05/2019   ALKPHOS 86 12/05/2019   AST 22 12/05/2019   ALT 14 12/05/2019   PROT 7.5 12/05/2019   ALBUMIN 4.6 12/05/2019   CALCIUM 9.1 12/05/2019   ANIONGAP 13 07/10/2019   Lab Results  Component Value Date   CHOL 186  12/05/2019   Lab Results  Component Value Date   HDL 48 12/05/2019   Lab Results  Component Value Date   LDLCALC 98 12/05/2019   Lab Results  Component Value Date   TRIG 235 (H) 12/05/2019   Lab Results  Component Value Date   CHOLHDL 3.9 12/05/2019   Lab Results  Component Value Date   HGBA1C 5.6 02/28/2017

## 2020-04-08 ENCOUNTER — Encounter: Payer: Self-pay | Admitting: Family

## 2020-04-08 ENCOUNTER — Ambulatory Visit (INDEPENDENT_AMBULATORY_CARE_PROVIDER_SITE_OTHER): Payer: Medicare HMO | Admitting: Family

## 2020-04-08 DIAGNOSIS — J208 Acute bronchitis due to other specified organisms: Secondary | ICD-10-CM

## 2020-04-08 DIAGNOSIS — B9689 Other specified bacterial agents as the cause of diseases classified elsewhere: Secondary | ICD-10-CM

## 2020-04-08 MED ORDER — DOXYCYCLINE HYCLATE 100 MG PO TABS: 100.0000 mg | ORAL_TABLET | Freq: Two times a day (BID) | ORAL | 0 refills | Status: DC

## 2020-04-08 MED ORDER — ALBUTEROL SULFATE HFA 108 (90 BASE) MCG/ACT IN AERS: 2.0000 | INHALATION_SPRAY | Freq: Four times a day (QID) | RESPIRATORY_TRACT | 0 refills | Status: DC | PRN

## 2020-04-08 NOTE — Progress Notes (Signed)
Virtual Visit via telephone Note Due to COVID-19 pandemic this visit was conducted virtually. This visit type was conducted due to national recommendations for restrictions regarding the COVID-19 Pandemic (e.g. social distancing, sheltering in place) in an effort to limit this patient's exposure and mitigate transmission in our community. All issues noted in this document were discussed and addressed.  A physical exam was not performed with this format.  I connected with Norma Barajas on 04/08/20 at 11:00 AM by telephone and verified that I am speaking with the correct person using two identifiers. Norma Barajas is currently located at home and husband is currently with her during visit. The provider, Evelina Dun, FNP is located in their office at time of visit.  I discussed the limitations, risks, security and privacy concerns of performing an evaluation and management service by telephone and the availability of in person appointments. I also discussed with the patient that there may be a patient responsible charge related to this service. The patient expressed understanding and agreed to proceed.   History and Present Illness:  Cough This is a new problem. The current episode started in the past 7 days. The problem has been gradually worsening. The problem occurs every few minutes. The cough is non-productive. Associated symptoms include myalgias, postnasal drip, rhinorrhea, shortness of breath and wheezing. Pertinent negatives include no ear congestion, ear pain, fever, headaches or nasal congestion. She has tried rest (tylenol ) for the symptoms. The treatment provided no relief.      Review of Systems  Constitutional: Negative for fever.  HENT: Positive for postnasal drip and rhinorrhea. Negative for ear pain.   Respiratory: Positive for cough, shortness of breath and wheezing.   Musculoskeletal: Positive for myalgias.  Neurological: Negative for headaches.  All other systems reviewed  and are negative.    Observations/Objective: No SOB or distress noted  Assessment and Plan: 1. Acute bacterial bronchitis - Take meds as prescribed - Use a cool mist humidifier  -Use saline nose sprays frequently -Force fluids -For any cough or congestion  Use plain Mucinex- regular strength or max strength is fine -For fever or aces or pains- take tylenol or ibuprofen. -Throat lozenges if help Call if symptoms worsen or do not improve  - albuterol (VENTOLIN HFA) 108 (90 Base) MCG/ACT inhaler; Inhale 2 puffs into the lungs every 6 (six) hours as needed for wheezing or shortness of breath.  Dispense: 8 g; Refill: 0 - doxycycline (VIBRA-TABS) 100 MG tablet; Take 1 tablet (100 mg total) by mouth 2 (two) times daily.  Dispense: 20 tablet; Refill: 0     I discussed the assessment and treatment plan with the patient. The patient was provided an opportunity to ask questions and all were answered. The patient agreed with the plan and demonstrated an understanding of the instructions.   The patient was advised to call back or seek an in-person evaluation if the symptoms worsen or if the condition fails to improve as anticipated.  The above assessment and management plan was discussed with the patient. The patient verbalized understanding of and has agreed to the management plan. Patient is aware to call the clinic if symptoms persist or worsen. Patient is aware when to return to the clinic for a follow-up visit. Patient educated on when it is appropriate to go to the emergency department.   Time call ended:  11:16 AM  I provided 16 minutes of non-face-to-face time during this encounter.    Evelina Dun, FNP

## 2020-04-09 ENCOUNTER — Telehealth: Payer: Self-pay | Admitting: Cardiology

## 2020-04-09 NOTE — Telephone Encounter (Signed)
Patient c/o Palpitations:  High priority if patient c/o lightheadedness, shortness of breath, or chest pain  1) How long have you had palpitations/irregular HR/ Afib? Are you having the symptoms now? Had for a week is having symptoms now   2) Are you currently experiencing lightheadedness, SOB or CP? No   3) Do you have a history of afib (atrial fibrillation) or irregular heart rhythm? Yes   4) Have you checked your BP or HR? (document readings if available): Yes states BP was fine   5) Are you experiencing any other symptoms? SOB & Fatigue   Norma Barajas is calling stating she has been experiencing Afib for the past week. She states she has also been experiencing SOB upon exertion. Norma Barajas is requesting an appointment for today. I advised her Dr. Percival Spanish is not a the [facility] office today, but that I would send a message in regards to it. Please advise.

## 2020-04-09 NOTE — Telephone Encounter (Signed)
Returned call to patient she stated her heart has been out of rhythm and beating fast since last Wed.She is having sob.Advised she needs to go to ED.Stated husband will take her to [facility] ED.

## 2020-04-16 ENCOUNTER — Telehealth: Payer: Self-pay | Admitting: Cardiology

## 2020-04-16 ENCOUNTER — Ambulatory Visit (HOSPITAL_COMMUNITY)
Admission: RE | Admit: 2020-04-16 | Discharge: 2020-04-16 | Disposition: A | Discharge status: Home / Self Care | Payer: Medicare HMO | Source: Ambulatory Visit | Attending: Nurse Practitioner | Admitting: Nurse Practitioner

## 2020-04-16 ENCOUNTER — Other Ambulatory Visit: Payer: Self-pay

## 2020-04-16 VITALS — BP 130/84 | HR 113 | Ht 61.0 in | Wt 132.4 lb

## 2020-04-16 DIAGNOSIS — I10 Essential (primary) hypertension: Secondary | ICD-10-CM | POA: Diagnosis not present

## 2020-04-16 DIAGNOSIS — Z79899 Other long term (current) drug therapy: Secondary | ICD-10-CM | POA: Insufficient documentation

## 2020-04-16 DIAGNOSIS — Z7901 Long term (current) use of anticoagulants: Secondary | ICD-10-CM | POA: Diagnosis not present

## 2020-04-16 DIAGNOSIS — I2510 Atherosclerotic heart disease of native coronary artery without angina pectoris: Secondary | ICD-10-CM | POA: Diagnosis not present

## 2020-04-16 DIAGNOSIS — D6869 Other thrombophilia: Secondary | ICD-10-CM

## 2020-04-16 DIAGNOSIS — M810 Age-related osteoporosis without current pathological fracture: Secondary | ICD-10-CM | POA: Diagnosis not present

## 2020-04-16 DIAGNOSIS — K219 Gastro-esophageal reflux disease without esophagitis: Secondary | ICD-10-CM | POA: Insufficient documentation

## 2020-04-16 DIAGNOSIS — I4819 Other persistent atrial fibrillation: Secondary | ICD-10-CM | POA: Insufficient documentation

## 2020-04-16 DIAGNOSIS — E7800 Pure hypercholesterolemia, unspecified: Secondary | ICD-10-CM | POA: Insufficient documentation

## 2020-04-16 DIAGNOSIS — R635 Abnormal weight gain: Secondary | ICD-10-CM | POA: Diagnosis not present

## 2020-04-16 MED ORDER — AMLODIPINE BESYLATE 10 MG PO TABS: 5.0000 mg | ORAL_TABLET | Freq: Every day | ORAL | 3 refills | Status: DC

## 2020-04-16 MED ORDER — METOPROLOL TARTRATE 25 MG PO TABS: 37.5000 mg | ORAL_TABLET | Freq: Two times a day (BID) | ORAL | 3 refills | Status: DC

## 2020-04-16 MED ORDER — POTASSIUM CHLORIDE ER 10 MEQ PO TBCR: 10.0000 meq | EXTENDED_RELEASE_TABLET | Freq: Every day | ORAL | 0 refills | Status: DC

## 2020-04-16 MED ORDER — FUROSEMIDE 20 MG PO TABS
20.0000 mg | ORAL_TABLET | Freq: Every day | ORAL | 0 refills | Status: DC
Start: 2020-04-16 — End: 2020-05-19

## 2020-04-16 NOTE — Telephone Encounter (Signed)
Patient has appointment today with Geroge Baseman NP. She is aware of time, location, and has been given parking code

## 2020-04-16 NOTE — Telephone Encounter (Signed)
Patient c/o Palpitations:  High priority if patient c/o lightheadedness, shortness of breath, or chest pain  1) How long have you had palpitations/irregular HR/ Afib? Are you having the symptoms now? Yes, 2 weeks  2) Are you currently experiencing lightheadedness, SOB or CP? Yes, Lightheadedness, SOB, CP  3) Do you have a history of afib (atrial fibrillation) or irregular heart rhythm? yes  4) Have you checked your BP or HR? (document readings if available): BP has been okay, HR over 100  5) Are you experiencing any other symptoms? no

## 2020-04-16 NOTE — Telephone Encounter (Signed)
Spoke with patient and she has had AFib for about 2 weeks. She has been taking her Sotalol, Warfarin, and Metoprolol as directed. Patient stated that whenever she gets up she feels like she is going to pass out. She is requesting appointment today. Have reached out to Afib clinic about getting appointment

## 2020-04-16 NOTE — Patient Instructions (Signed)
START Lasix 20mg  once a day  START Potassium (KDur) 24meq once a day  INCREASE metoprolol to 37.5mg  twice a day (1 and 1/2 tablets twice a day)  DECREASE amlodipine (norvasc) to 5mg  a day (1/2 of your 10mg  tablet)   Followup with [facility] Monday at 4pm.   You will need 4 weekly INR checks -- we will call you after 3 in a row

## 2020-04-17 ENCOUNTER — Encounter (HOSPITAL_COMMUNITY): Payer: Self-pay | Admitting: Nurse Practitioner

## 2020-04-17 NOTE — H&P (View-Only) (Signed)
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 This correlates with the development of an URI, bronchitis that the pt reports was bordering  on pneumonia. She  was prescribed doxycycline but she stopped after a few days because she felt it was making her worse. Despite stopping antibiotic, she feels that she is improving form the URI. She feels shortness of breath, fatigue and feels like she is retaining fluid in her legs and abdomen.  She is in afib with RVR. She is on warfarin with a CHA2DS2VASc score of 4.   Today, she denies symptoms of palpitations, chest pain, shortness of breath, orthopnea, PND, lower extremity edema, dizziness, presyncope, syncope, or neurologic sequela. The patient is tolerating medications without difficulties and is otherwise without complaint today.   Past Medical History:  Diagnosis Date  . 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 Anxiety   . Arthritis    "right leg" (04/11/2015)  . BPPV (benign paroxysmal positional vertigo) 03/23/2016  . Cataract   . Coronary atherosclerosis of native coronary artery    a. Nonobstructive minimal CAD 10/2005.  Marland Kitchen Depression   . Diastolic dysfunction    Grade 1. Ejection fraction 60-65%.  Marland Kitchen Dysrhythmia    a fib  . Erosive esophagitis   . Essential hypertension   . GERD (gastroesophageal reflux disease)   . Grade III hemorrhoids   . History of hiatal hernia   . Hypercholesterolemia   . Internal hemorrhoids with complication 8/41/6606   OCT 2015 FLEX SIG/IH BANDING    . Migraine    "used to have them right bad; I don't now" (04/11/2015)  . MITRAL REGURGITATION 04/27/2010   Qualifier: Diagnosis of  By: [name], MD, [name]   . Osteoporosis   . Paroxysmal atrial fibrillation (HCC)   . PONV (postoperative nausea and  vomiting)   . Vertigo    Past Surgical History:  Procedure Laterality Date  . BRAVO [place] STUDY  11/15/2012   Procedure: BRAVO [place];  Surgeon: [name], MD;  [facility];  Service: Endoscopy;;  . CATARACT EXTRACTION W/ INTRAOCULAR LENS  IMPLANT, BILATERAL Bilateral   . COLONOSCOPY  2008   Dr. [name]: internal hemorrhoids   . DILATION AND CURETTAGE OF UTERUS    . ESOPHAGOGASTRODUODENOSCOPY (EGD) WITH ESOPHAGEAL DILATION  2001   Dr. [name]: erosive esophagitis, esophageal stricture, duodenitis, s/p Savary dilation  . ESOPHAGOGASTRODUODENOSCOPY (EGD) WITH ESOPHAGEAL DILATION  11/15/2012   TKZ:[id number] web was found & MOST LIKELY CAUSE FOR DYAPHAGIA/Polyp was found in the gastric body and gastric fundus/ gastritis on bx  . EYE SURGERY Bilateral    "laser OR after cataract OR; cause I couldn't see"  . FLEXIBLE SIGMOIDOSCOPY N/A 08/22/2014   mild diverticulosis in sigmoid, moderate sized Grade 3 hemorrhoids s/p banding X 3.   Marland Kitchen FRACTURE SURGERY Right    below the knee - 2 bones broke has plates  . HEMORRHOID BANDING N/A 08/22/2014   Procedure: HEMORRHOID BANDING;  Surgeon: [name], MD;  [facility];  Service: Endoscopy;  Laterality: N/A;  . HEMORRHOID SURGERY N/A 03/30/2018   Procedure: EXTENSIVE HEMORRHOIDECTOMY;  Surgeon: [name], MD;  Location: [facility];  Service: General;  Laterality: N/A;  . OPEN REDUCTION INTERNAL FIXATION (  ORIF) TIBIA/FIBULA FRACTURE Right 2013   broke tibia and fibula after falling down stairs  . PROLAPSED UTERINE FIBROID LIGATION  2015  . TOTAL ABDOMINAL HYSTERECTOMY      Current Outpatient Medications  Medication Sig Dispense Refill  . amLODipine (NORVASC) 10 MG tablet Take 0.5 tablets (5 mg total) by mouth daily. 90 tablet 3  . Calcium Carbonate-Vitamin D (CALCIUM-VITAMIN D3 PO) Take 1 tablet by mouth daily.    . cholecalciferol (VITAMIN D3) 25 MCG (1000 UT) tablet Take 1,000 Units by mouth daily.    . metoprolol  tartrate (LOPRESSOR) 25 MG tablet Take 1.5 tablets (37.5 mg total) by mouth 2 (two) times daily. 180 tablet 3  . Multiple Vitamin (MULTIVITAMIN) capsule Take 1 capsule by mouth every evening.    . Omega-3 Fatty Acids (FISH OIL) 1000 MG CAPS Take 2,000 mg by mouth 2 (two) times daily.    Marland Kitchen omeprazole (PRILOSEC) 20 MG capsule Take 1 capsule (20 mg total) by mouth daily. 90 capsule 3  . simvastatin (ZOCOR) 40 MG tablet Take 1 tablet (40 mg total) by mouth daily. 90 tablet 2  . sotalol (BETAPACE) 80 MG tablet Take 1 tablet (80 mg total) by mouth daily. 90 tablet 1  . warfarin (COUMADIN) 3 MG tablet TAKE 1/2 TO 1 TABLET EVERY DAY AS DIRECTED  BY  ANTICOAGULATION  CLINIC 90 tablet 1  . furosemide (LASIX) 20 MG tablet Take 1 tablet (20 mg total) by mouth daily. 30 tablet 0  . potassium chloride (KLOR-CON) 10 MEQ tablet Take 1 tablet (10 mEq total) by mouth daily. 30 tablet 0   No current facility-administered medications for this encounter.    Allergies  Allergen Reactions  . Codeine Nausea And Vomiting  . Doxycycline Other (See Comments)    Chest congestion  . Triamterene-Hctz Other (See Comments)    weakness  . Atorvastatin Other (See Comments)    Myalgias   . Crestor [Rosuvastatin Calcium] Other (See Comments)    weakness  . Morphine Nausea Only  . Risedronate Sodium Other (See Comments)    ACTONEL REACTION: reflux    Social History   Socioeconomic History  . Marital status: Married    Spouse name: george   . Number of children: 1  . Years of education: Not on file  . Highest education level: Not on file  Occupational History  . Occupation: Retired    Comment: Textile  Tobacco Use  . Smoking status: Never Smoker  . Smokeless tobacco: Never Used  Vaping Use  . Vaping Use: Never used  Substance and Sexual Activity  . Alcohol use: No    Alcohol/week: 0.0 standard drinks  . Drug use: No  . Sexual activity: Not Currently    Birth control/protection: Surgical, Post-menopausal     Comment: hyst  Other Topics Concern  . Not on file  Social History Narrative   Married   No regular exercise   Social Determinants of Health   Financial Resource Strain:   . Difficulty of Paying Living Expenses:   Food Insecurity:   . Worried About Charity fundraiser in the Last Year:   . Arboriculturist in the Last Year:   Transportation Needs:   . Film/video editor (Medical):   Marland Kitchen Lack of Transportation (Non-Medical):   Physical Activity:   . Days of Exercise per Week:   . Minutes of Exercise per Session:   Stress:   . Feeling of Stress :   Social Connections:   .  Frequency of Communication with Friends and Family:   . Frequency of Social Gatherings with Friends and Family:   . Attends Religious Services:   . Active Member of Clubs or Organizations:   . Attends Archivist Meetings:   Marland Kitchen Marital Status:   Intimate Partner Violence:   . Fear of Current or Ex-Partner:   . Emotionally Abused:   Marland Kitchen Physically Abused:   . Sexually Abused:     Family History  Problem Relation Age of Onset  . Colon cancer Mother 80  . Heart disease Mother   . Osteoporosis Mother   . Hip fracture Mother   . Stroke Sister   . Diabetes Sister   . Osteoporosis Sister   . Arthritis Sister   . Uterine cancer Sister   . Stroke Sister   . Heart disease Father   . Hyperlipidemia Brother   . Hypertension Brother   . Heart disease Brother   . Stroke Brother   . Heart disease Sister   . Dementia Sister   . Diabetes Son   . Stroke Son   . Heart attack Neg Hx     ROS- All systems are reviewed and negative except as per the HPI above  Physical Exam: Vitals:   04/16/20 1450  BP: 130/84  Pulse: (!) 113  Weight: 60.1 kg  Height: 5\' 1"  (1.549 m)   Wt Readings from Last 3 Encounters:  04/16/20 60.1 kg  04/02/20 58.2 kg  02/27/20 56.2 kg    Labs: Lab Results  Component Value Date   NA 141 12/05/2019   K 3.6 12/05/2019   CL 99 12/05/2019   CO2 29 12/05/2019    GLUCOSE 93 12/05/2019   BUN 9 12/05/2019   CREATININE 0.66 12/05/2019   CALCIUM 9.1 12/05/2019   MG 2.3 02/13/2016   Lab Results  Component Value Date   INR 2.6 (H) 04/02/2020   Lab Results  Component Value Date   CHOL 186 12/05/2019   HDL 48 12/05/2019   LDLCALC 98 12/05/2019   TRIG 235 (H) 12/05/2019     GEN- The patient is well appearing, alert and oriented x 3 today.   Head- normocephalic, atraumatic Eyes-  Sclera clear, conjunctiva pink Ears- hearing intact Oropharynx- clear Neck- supple, no JVP Lymph- no cervical lymphadenopathy Lungs- Clear to ausculation bilaterally, normal work of breathing Heart- irregular rate and rhythm, no murmurs, rubs or gallops, PMI not laterally displaced GI- soft, NT, ND, + BS Extremities- no clubbing, cyanosis, or 1t edema bilaterally MS- no significant deformity or atrophy Skin- no rash or lesion Psych- euthymic mood, full affect Neuro- strength and sensation are intact  EKG-afib at 113 bpm    Assessment and Plan: 1. Persistent afib with RVR Trigger may have been recent URI I will increase metoprolol to 25 mg 1 1/2 tab bid  Discussed cardioversion with pt if afib persists  Continue sotalol 80 mg daily   2. HTN Stable Will cut amlodipine in half to allow for additional meds to treat afib  3. Weight gain  Weight is up 4-5 lbs Will add 20 mg lasix  daily and 10 meq Kt daily   4. CHA2DS2VASc score of 4 She is on warfarin followed in [place] at [facility]  We will notify that office that 4 weekly INR's will be needed in order to plan for cardioversion  I offered a TEE/cardioversion to shorten the wait time but she declined   She wants to come into [place] as  little as possible as her elderly husband does not like the drive   I will have her to follow up in the [place] office on Monday,  to f/u increase of BB for rate control and diuretic for edema, bmet will be needed and decision to leave on lasix or use prn  if  weight gain noted   We have asked for the weekly INR's to start at that time and forward the results to this office After 3 therapeutic results are seen will call pt to schedule the cardioversion, with the 4th test am of cardioversion

## 2020-04-17 NOTE — Progress Notes (Signed)
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 This correlates with the development of an URI, bronchitis that the pt reports was bordering  on pneumonia. She  was prescribed doxycycline but she stopped after a few days because she felt it was making her worse. Despite stopping antibiotic, she feels that she is improving form the URI. She feels shortness of breath, fatigue and feels like she is retaining fluid in her legs and abdomen.  She is in afib with RVR. She is on warfarin with a CHA2DS2VASc score of 4.   Today, she denies symptoms of palpitations, chest pain, shortness of breath, orthopnea, PND, lower extremity edema, dizziness, presyncope, syncope, or neurologic sequela. The patient is tolerating medications without difficulties and is otherwise without complaint today.   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 Nonobstructive minimal CAD 10/2005.   Depression    Diastolic dysfunction    Grade 1. Ejection fraction 60-65%.   Dysrhythmia    a fib   Erosive esophagitis    Essential hypertension    GERD (gastroesophageal reflux disease)    Grade III hemorrhoids    History of hiatal hernia    Hypercholesterolemia    Internal hemorrhoids with complication 3/47/4259   OCT 2015 FLEX SIG/IH BANDING     Migraine    "used to have them right bad; I don't now" (04/11/2015)   MITRAL REGURGITATION 04/27/2010   Qualifier: Diagnosis of  By: [name], MD, [name]    Osteoporosis    Paroxysmal atrial fibrillation ([name])    PONV (postoperative nausea and  vomiting)    Vertigo    Past Surgical History:  Procedure Laterality Date   BRAVO [place] STUDY  11/15/2012   Procedure: BRAVO [place];  Surgeon: [name], MD;  [facility];  Service: Endoscopy;;   CATARACT EXTRACTION W/ INTRAOCULAR LENS  IMPLANT, BILATERAL Bilateral    COLONOSCOPY  2008   Dr. [name]: internal hemorrhoids    DILATION AND CURETTAGE OF UTERUS     ESOPHAGOGASTRODUODENOSCOPY (EGD) WITH ESOPHAGEAL DILATION  2001   Dr. [name]: erosive esophagitis, esophageal stricture, duodenitis, s/p Savary dilation   ESOPHAGOGASTRODUODENOSCOPY (EGD) WITH ESOPHAGEAL DILATION  11/15/2012   DGL:[id number] web was found & MOST LIKELY CAUSE FOR DYAPHAGIA/Polyp was found in the gastric body and gastric fundus/ gastritis on bx   EYE SURGERY Bilateral    "laser OR after cataract OR; cause I couldn't see"   FLEXIBLE SIGMOIDOSCOPY N/A 08/22/2014   mild diverticulosis in sigmoid, moderate sized Grade 3 hemorrhoids s/p banding X 3.    FRACTURE SURGERY Right    below the knee - 2 bones broke has plates   HEMORRHOID BANDING N/A 08/22/2014   Procedure: HEMORRHOID BANDING;  Surgeon: [name], MD;  [facility];  Service: Endoscopy;  Laterality: N/A;   HEMORRHOID SURGERY N/A 03/30/2018   Procedure: EXTENSIVE HEMORRHOIDECTOMY;  Surgeon: [name], MD;  Location: [facility];  Service: General;  Laterality: N/A;   OPEN REDUCTION INTERNAL FIXATION (  ORIF) TIBIA/FIBULA FRACTURE Right 2013   broke tibia and fibula after falling down stairs   PROLAPSED UTERINE FIBROID LIGATION  2015   TOTAL ABDOMINAL HYSTERECTOMY      Current Outpatient Medications  Medication Sig Dispense Refill   amLODipine (NORVASC) 10 MG tablet Take 0.5 tablets (5 mg total) by mouth daily. 90 tablet 3   Calcium Carbonate-Vitamin D (CALCIUM-VITAMIN D3 PO) Take 1 tablet by mouth daily.     cholecalciferol (VITAMIN D3) 25 MCG (1000 UT) tablet Take 1,000 Units by mouth daily.     metoprolol  tartrate (LOPRESSOR) 25 MG tablet Take 1.5 tablets (37.5 mg total) by mouth 2 (two) times daily. 180 tablet 3   Multiple Vitamin (MULTIVITAMIN) capsule Take 1 capsule by mouth every evening.     Omega-3 Fatty Acids (FISH OIL) 1000 MG CAPS Take 2,000 mg by mouth 2 (two) times daily.     omeprazole (PRILOSEC) 20 MG capsule Take 1 capsule (20 mg total) by mouth daily. 90 capsule 3   simvastatin (ZOCOR) 40 MG tablet Take 1 tablet (40 mg total) by mouth daily. 90 tablet 2   sotalol (BETAPACE) 80 MG tablet Take 1 tablet (80 mg total) by mouth daily. 90 tablet 1   warfarin (COUMADIN) 3 MG tablet TAKE 1/2 TO 1 TABLET EVERY DAY AS DIRECTED  BY  ANTICOAGULATION  CLINIC 90 tablet 1   furosemide (LASIX) 20 MG tablet Take 1 tablet (20 mg total) by mouth daily. 30 tablet 0   potassium chloride (KLOR-CON) 10 MEQ tablet Take 1 tablet (10 mEq total) by mouth daily. 30 tablet 0   No current facility-administered medications for this encounter.    Allergies  Allergen Reactions   Codeine Nausea And Vomiting   Doxycycline Other (See Comments)    Chest congestion   Triamterene-Hctz Other (See Comments)    weakness   Atorvastatin Other (See Comments)    Myalgias    Crestor [Rosuvastatin Calcium] Other (See Comments)    weakness   Morphine Nausea Only   Risedronate Sodium Other (See Comments)    ACTONEL REACTION: reflux    Social History   Socioeconomic History   Marital status: Married    Spouse name: george    Number of children: 1   Years of education: Not on file   Highest education level: Not on file  Occupational History   Occupation: Retired    Comment: Textile  Tobacco Use   Smoking status: Never Smoker   Smokeless tobacco: Never Used  Scientific laboratory technician Use: Never used  Substance and Sexual Activity   Alcohol use: No    Alcohol/week: 0.0 standard drinks   Drug use: No   Sexual activity: Not Currently    Birth control/protection: Surgical, Post-menopausal     Comment: hyst  Other Topics Concern   Not on file  Social History Narrative   Married   No regular exercise   Social Determinants of Health   Financial Resource Strain:    Difficulty of Paying Living Expenses:   Food Insecurity:    Worried About Charity fundraiser in the Last Year:    Arboriculturist in the Last Year:   Transportation Needs:    Film/video editor (Medical):    Lack of Transportation (Non-Medical):   Physical Activity:    Days of Exercise per Week:    Minutes of Exercise per Session:   Stress:    Feeling of Stress :   Social Connections:  Frequency of Communication with Friends and Family:    Frequency of Social Gatherings with Friends and Family:    Attends Religious Services:    Active Member of Clubs or Organizations:    Attends Music therapist:    Marital Status:   Intimate Partner Violence:    Fear of Current or Ex-Partner:    Emotionally Abused:    Physically Abused:    Sexually Abused:     Family History  Problem Relation Age of Onset   Colon cancer Mother 82   Heart disease Mother    Osteoporosis Mother    Hip fracture Mother    Stroke Sister    Diabetes Sister    Osteoporosis Sister    Arthritis Sister    Uterine cancer Sister    Stroke Sister    Heart disease Father    Hyperlipidemia Brother    Hypertension Brother    Heart disease Brother    Stroke Brother    Heart disease Sister    Dementia Sister    Diabetes Son    Stroke Son    Heart attack Neg Hx     ROS- All systems are reviewed and negative except as per the HPI above  Physical Exam: Vitals:   04/16/20 1450  BP: 130/84  Pulse: (!) 113  Weight: 60.1 kg  Height: 5\' 1"  (1.549 m)   Wt Readings from Last 3 Encounters:  04/16/20 60.1 kg  04/02/20 58.2 kg  02/27/20 56.2 kg    Labs: Lab Results  Component Value Date   NA 141 12/05/2019   K 3.6 12/05/2019   CL 99 12/05/2019   CO2 29 12/05/2019    GLUCOSE 93 12/05/2019   BUN 9 12/05/2019   CREATININE 0.66 12/05/2019   CALCIUM 9.1 12/05/2019   MG 2.3 02/13/2016   Lab Results  Component Value Date   INR 2.6 (H) 04/02/2020   Lab Results  Component Value Date   CHOL 186 12/05/2019   HDL 48 12/05/2019   LDLCALC 98 12/05/2019   TRIG 235 (H) 12/05/2019     GEN- The patient is well appearing, alert and oriented x 3 today.   Head- normocephalic, atraumatic Eyes-  Sclera clear, conjunctiva pink Ears- hearing intact Oropharynx- clear Neck- supple, no JVP Lymph- no cervical lymphadenopathy Lungs- Clear to ausculation bilaterally, normal work of breathing Heart- irregular rate and rhythm, no murmurs, rubs or gallops, PMI not laterally displaced GI- soft, NT, ND, + BS Extremities- no clubbing, cyanosis, or 1t edema bilaterally MS- no significant deformity or atrophy Skin- no rash or lesion Psych- euthymic mood, full affect Neuro- strength and sensation are intact  EKG-afib at 113 bpm    Assessment and Plan: 1. Persistent afib with RVR Trigger may have been recent URI I will increase metoprolol to 25 mg 1 1/2 tab bid  Discussed cardioversion with pt if afib persists  Continue sotalol 80 mg daily   2. HTN Stable Will cut amlodipine in half to allow for additional meds to treat afib  3. Weight gain  Weight is up 4-5 lbs Will add 20 mg lasix  daily and 10 meq Kt daily   4. CHA2DS2VASc score of 4 She is on warfarin followed in [place] at [facility]  We will notify that office that 4 weekly INR's will be needed in order to plan for cardioversion  I offered a TEE/cardioversion to shorten the wait time but she declined   She wants to come into [place] as  little as possible as her elderly husband does not like the drive   I will have her to follow up in the [place] office on Monday,  to f/u increase of BB for rate control and diuretic for edema, bmet will be needed and decision to leave on lasix or use prn  if  weight gain noted   We have asked for the weekly INR's to start at that time and forward the results to this office After 3 therapeutic results are seen will call pt to schedule the cardioversion, with the 4th test am of cardioversion

## 2020-04-18 ENCOUNTER — Telehealth (HOSPITAL_COMMUNITY): Payer: Self-pay | Admitting: *Deleted

## 2020-04-18 NOTE — Telephone Encounter (Signed)
Pt called to report very little response to lasix since starting - her abdomen is still bloated and increased swelling. Discussed with Roderic Palau NP will increase lasix 40mg  once a day and Kdur to 5meq once a day through Monday. Sees PCP on Monday they can decide on continued dosage depending on response. Pt in agreement.

## 2020-04-21 ENCOUNTER — Encounter: Payer: Self-pay | Admitting: Nurse Practitioner

## 2020-04-21 ENCOUNTER — Other Ambulatory Visit: Payer: Self-pay

## 2020-04-21 ENCOUNTER — Ambulatory Visit (INDEPENDENT_AMBULATORY_CARE_PROVIDER_SITE_OTHER): Payer: Medicare HMO | Admitting: Nurse Practitioner

## 2020-04-21 VITALS — BP 131/92 | HR 118 | Temp 97.3°F | Resp 20 | Ht 61.0 in | Wt 131.0 lb

## 2020-04-21 DIAGNOSIS — I4891 Unspecified atrial fibrillation: Secondary | ICD-10-CM

## 2020-04-21 DIAGNOSIS — Z7901 Long term (current) use of anticoagulants: Secondary | ICD-10-CM

## 2020-04-21 LAB — COAGUCHEK XS/INR WAIVED
INR: 2.3 — ABNORMAL HIGH (ref 0.9–1.1)
Prothrombin Time: 27.1 s

## 2020-04-21 NOTE — Progress Notes (Signed)
New Patient Office Visit  Subjective:  Patient ID: Norma Barajas, female    DOB: 10-Sep-1941  Age: 79 y.o. MRN: [id number]  CC:  Chief Complaint  Patient presents with  . Anticoagulation    elevated HR, check BP and weight today    HPI Norma Barajas presents for  elevated heart rate, blood pressure checks and weight.  Patient will be starting Coumadin clinic and required to have 3 weeks blood pressure, weight and PT/ INR checks.  Patient is not currently having any signs and symptoms of A. fib.  Patient is reporting no palpitations chest pains, orthopnea, dizziness, syncope or lower extremity edema.  Patient is tolerating current medication with no side effects, patient is compliant and follows up as required.    Past Medical History:  Diagnosis Date  . 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 Anxiety   . Arthritis    "right leg" (04/11/2015)  . BPPV (benign paroxysmal positional vertigo) 03/23/2016  . Cataract   . Coronary atherosclerosis of native coronary artery    a. Nonobstructive minimal CAD 10/2005.  Marland Kitchen Depression   . Diastolic dysfunction    Grade 1. Ejection fraction 60-65%.  Marland Kitchen Dysrhythmia    a fib  . Erosive esophagitis   . Essential hypertension   . GERD (gastroesophageal reflux disease)   . Grade III hemorrhoids   . History of hiatal hernia   . Hypercholesterolemia   . Internal hemorrhoids with complication 1/44/3154   OCT 2015 FLEX SIG/IH BANDING    . Migraine    "used to have them right bad; I don't now" (04/11/2015)  . MITRAL REGURGITATION 04/27/2010   Qualifier: Diagnosis of  By: [name], MD, [name]   . Osteoporosis   . Paroxysmal atrial fibrillation (HCC)   . PONV (postoperative nausea and vomiting)   . Vertigo     Past Surgical History:  Procedure Laterality Date  . BRAVO [place] STUDY  11/15/2012   Procedure: BRAVO [place];  Surgeon: [name], MD;  Location: [facility];  Service: Endoscopy;;  . CATARACT EXTRACTION W/ INTRAOCULAR  LENS  IMPLANT, BILATERAL Bilateral   . COLONOSCOPY  2008   Dr. [name]: internal hemorrhoids   . DILATION AND CURETTAGE OF UTERUS    . ESOPHAGOGASTRODUODENOSCOPY (EGD) WITH ESOPHAGEAL DILATION  2001   Dr. [name]: erosive esophagitis, esophageal stricture, duodenitis, s/p Savary dilation  . ESOPHAGOGASTRODUODENOSCOPY (EGD) WITH ESOPHAGEAL DILATION  11/15/2012   MGQ:[id number] web was found & MOST LIKELY CAUSE FOR DYAPHAGIA/Polyp was found in the gastric body and gastric fundus/ gastritis on bx  . EYE SURGERY Bilateral    "laser OR after cataract OR; cause I couldn't see"  . FLEXIBLE SIGMOIDOSCOPY N/A 08/22/2014   mild diverticulosis in sigmoid, moderate sized Grade 3 hemorrhoids s/p banding X 3.   Marland Kitchen FRACTURE SURGERY Right    below the knee - 2 bones broke has plates  . HEMORRHOID BANDING N/A 08/22/2014   Procedure: HEMORRHOID BANDING;  Surgeon: [name], MD;  Location: [facility];  Service: Endoscopy;  Laterality: N/A;  . HEMORRHOID SURGERY N/A 03/30/2018   Procedure: EXTENSIVE HEMORRHOIDECTOMY;  Surgeon: [name], MD;  Location: [facility];  Service: General;  Laterality: N/A;  . OPEN REDUCTION INTERNAL FIXATION (ORIF) TIBIA/FIBULA FRACTURE Right 2013   broke tibia and fibula after falling down stairs  . PROLAPSED UTERINE FIBROID LIGATION  2015  . TOTAL ABDOMINAL HYSTERECTOMY      Family History  Problem Relation Age of Onset  . Colon cancer Mother 19  . Heart disease Mother   . Osteoporosis Mother   . Hip fracture Mother   . Stroke Sister   . Diabetes Sister   . Osteoporosis Sister   . Arthritis Sister   . Uterine cancer Sister   . Stroke Sister   . Heart disease Father   . Hyperlipidemia Brother   . Hypertension Brother   . Heart disease Brother   . Stroke Brother   . Heart disease Sister   . Dementia Sister   . Diabetes Son   . Stroke Son   . Heart attack Neg Hx     Social History   Socioeconomic History  . Marital status: Married    Spouse name:  george   . Number of children: 1  . Years of education: Not on file  . Highest education level: Not on file  Occupational History  . Occupation: Retired    Comment: Textile  Tobacco Use  . Smoking status: Never Smoker  . Smokeless tobacco: Never Used  Vaping Use  . Vaping Use: Never used  Substance and Sexual Activity  . Alcohol use: No    Alcohol/week: 0.0 standard drinks  . Drug use: No  . Sexual activity: Not Currently    Birth control/protection: Surgical, Post-menopausal    Comment: hyst  Other Topics Concern  . Not on file  Social History Narrative   Married   No regular exercise   Social Determinants of Health   Financial Resource Strain:   . Difficulty of Paying Living Expenses:   Food Insecurity:   . Worried About Charity fundraiser in the Last Year:   . Arboriculturist in the Last Year:   Transportation Needs:   . Film/video editor (Medical):   Marland Kitchen Lack of Transportation (Non-Medical):   Physical Activity:   . Days of Exercise per Week:   . Minutes of Exercise per Session:   Stress:   . Feeling of Stress :   Social Connections:   . Frequency of Communication with Friends and Family:   . Frequency of Social Gatherings with Friends and Family:   . Attends Religious Services:   . Active Member of Clubs or Organizations:   . Attends Archivist Meetings:   Marland Kitchen Marital Status:   Intimate Partner Violence:   . Fear of Current or Ex-Partner:   . Emotionally Abused:   Marland Kitchen Physically Abused:   . Sexually Abused:     ROS Review of Systems  Constitutional: Negative.   HENT: Negative.   Eyes: Negative.   Respiratory: Negative.   Cardiovascular: Positive for leg swelling. Negative for chest pain and palpitations.  Gastrointestinal: Negative.   Genitourinary: Negative.   Musculoskeletal: Negative.   Neurological: Negative.     Objective:   Today's Vitals: BP (!) 131/92   Pulse (!) 118   Temp (!) 97.3 F (36.3 C)   Resp 20   Ht 5' 1"   (1.549 m)   Wt 131 lb (59.4 kg)   SpO2 95%   BMI 24.75 kg/m   Physical Exam Constitutional:      Appearance: Normal appearance.  HENT:     Head: Normocephalic.  Eyes:     Conjunctiva/sclera: Conjunctivae normal.  Cardiovascular:     Comments: A.fib Pulmonary:     Effort: Pulmonary effort is normal.     Breath sounds: Normal breath sounds.  Musculoskeletal:  General: Normal range of motion.     Cervical back: Neck supple.  Skin:    General: Skin is dry.     Findings: No erythema or rash.  Neurological:     Mental Status: She is alert and oriented to person, place, and time.     Assessment & Plan:  Chronic anticoagulation PT/INR test completed, patient within normal range.  Patient will repeat test for 3 weeks.  Atrial fibrillation [facility]) Patient is a 79 year old female who presents to clinic today  after A.fib.  Elevated blood pressure and heart rate. Weight, blood pressure, and PT/INR checks required by Coumadin clinic for 3 consecutive weeks. Patient is not currently having any signs and symptoms of A. fib.  Patient is reporting no palpitations, chest pain, orthopnea, dizziness, syncope or lower extremity edema.  Patient is tolerating current medication with no side effects, patient is compliant and follows up as required. PT/INR completed, patient is within normal range today.  Patient will follow up in 1 week, and two consecutive weeks. Education provided to patient for  PT/INR checks, and  understanding A. fib. Patient knows to follow-up with any worsening or unresolved symptoms of A. fib.  Printed handout given.  Problem List Items Addressed This Visit      Other   Chronic anticoagulation   Relevant Orders   BMP8+EGFR    Other Visit Diagnoses    Atrial fibrillation, unspecified type [facility])    -  Primary   Relevant Orders   BMP8+EGFR      Outpatient Encounter Medications as of 04/21/2020  Medication Sig  . amLODipine (NORVASC) 10 MG tablet Take 0.5  tablets (5 mg total) by mouth daily.  . Calcium Carbonate-Vitamin D (CALCIUM-VITAMIN D3 PO) Take 1 tablet by mouth daily.  . cholecalciferol (VITAMIN D3) 25 MCG (1000 UT) tablet Take 1,000 Units by mouth daily.  . furosemide (LASIX) 20 MG tablet Take 1 tablet (20 mg total) by mouth daily.  . metoprolol tartrate (LOPRESSOR) 25 MG tablet Take 1.5 tablets (37.5 mg total) by mouth 2 (two) times daily.  . Multiple Vitamin (MULTIVITAMIN) capsule Take 1 capsule by mouth every evening.  . Omega-3 Fatty Acids (FISH OIL) 1000 MG CAPS Take 2,000 mg by mouth 2 (two) times daily.  Marland Kitchen omeprazole (PRILOSEC) 20 MG capsule Take 1 capsule (20 mg total) by mouth daily.  . potassium chloride (KLOR-CON) 10 MEQ tablet Take 1 tablet (10 mEq total) by mouth daily.  . simvastatin (ZOCOR) 40 MG tablet Take 1 tablet (40 mg total) by mouth daily.  . sotalol (BETAPACE) 80 MG tablet Take 1 tablet (80 mg total) by mouth daily.  Marland Kitchen warfarin (COUMADIN) 3 MG tablet TAKE 1/2 TO 1 TABLET EVERY DAY AS DIRECTED  BY  ANTICOAGULATION  CLINIC   No facility-administered encounter medications on file as of 04/21/2020.    Follow-up: Return in about 8 days (around 04/29/2020) for INR check.   Ivy Lynn, NP

## 2020-04-21 NOTE — Patient Instructions (Addendum)
Chronic anticoagulation PT/INR test completed, patient within normal range.  Patient will repeat test for 3 weeks.  Atrial fibrillation [facility]) Patient is a 79 year old female who presents to clinic today  after A.fib.  Elevated blood pressure and heart rate. Weight, blood pressure, and PT/INR checks required by Coumadin clinic for 3 consecutive weeks. Patient is not currently having any signs and symptoms of A. fib.  Patient is reporting no palpitations, chest pain, orthopnea, dizziness, syncope or lower extremity edema.  Patient is tolerating current medication with no side effects, patient is compliant and follows up as required. PT/INR completed, patient is within normal range today.  Patient will follow up in 1 week, and two consecutive weeks. Education provided to patient for  PT/INR checks, and  understanding A. fib. Patient knows to follow-up with any worsening or unresolved symptoms of A. fib.  Printed handout given.    Prothrombin Time, International Normalized Ratio Test Why am I having this test? A prothrombin time (pro-time, PT) test may be ordered if:  You have certain medical conditions that cause abnormal bleeding or blood clotting. These can include: ? Liver disease. ? Systemic infection (sepsis). ? Inherited (genetic) bleeding disorders.  You are taking a medicine to prevent excessive blood clotting (anticoagulant), such as warfarin. ? If you are taking warfarin, you will likely be asked to have this test done at regular intervals. The results of this test will help your health care provider determine what dose of warfarin you need based on how quickly or slowly your blood clots. It is very important to have this test done as often as your health care provider recommends. What is being tested? A prothrombin time (pro-time, PT) test measures how many seconds it takes your blood to clot. The international normalized ratio (INR) is a calculation of blood clotting time based on  your PT result. Most labs report both PT and INR values when reporting blood clotting times. What kind of sample is taken?  A blood sample is required for this test. It is usually collected by inserting a needle into a blood vessel. Tell a health care provider about:  Any blood disorders you have.  All medicines you are taking, including vitamins, herbs, eye drops, creams, and over-the-counter medicines. Do not stop, add, or change any medicines without letting your health care provider know.  The foods you regularly eat, especially foods that contain moderate or high amounts of vitamin K. It is important to eat a consistent amount of foods rich in vitamin K. Let your health care provider know if you have recently changed your diet.  If you drink alcohol. This can affect your lab results. How are the results reported? Your test results will be reported as values. Your health care provider will compare your results to normal ranges that were established after testing a large group of people (reference ranges). Reference ranges may vary among different labs and hospitals. For this test, common reference ranges are:  Without anticoagulant treatment (control value): 11.0-12.5 seconds; 85-100%.  INR: 0.8-1.1. If you are taking warfarin, talk with your health care provider about what your INR result should be. Generally, an INR of 2.0-3.0 is desired for blood clot prevention. This depends on your medical conditions. What do the results mean?  A higher than normal PT or INR means that your blood takes longer to form a clot. This can result from: ? Certain medicines. ? Liver disease. ? Lack of certain proteins that form clots (coagulation factors). ? Lack  of some vitamins.  A lower than normal PT or INR means that your blood can form a clot easily. This can result from: ? Supplements that contain Vitamin K. ? Medicines that contain estrogen, such as birth control pills or hormone  replacement. ? Cancer. ? Some blood disorders (disseminated intravascular coagulation). Talk with your health care provider about what your test results mean. If you are taking warfarin or another anticoagulant, your result ranges may be different. Talk to your health care provider about what your results should be. Questions to ask your health care provider Ask your health care provider or the department that is doing the test:  When will my results be ready?  How will I get my results?  What are my treatment options?  What other tests do I need?  What are my next steps? Summary  A prothrombin time (pro-time, PT) test measures how many seconds it takes your blood to clot.  You may have this test if you have a medical condition that causes abnormal bleeding or blood clotting, or if you are taking a medicine to prevent abnormal blood clotting.  A test result that is higher than normal indicates that your blood is taking too long to form a clot. This result may occur because you lack some vitamins, take certain medicines, or have certain medical conditions.  Talk with your health care provider about what your results mean. This information is not intended to replace advice given to you by your health care provider. Make sure you discuss any questions you have with your health care provider. Document Revised: 12/10/2017 Document Reviewed: 12/10/2017 Elsevier Patient Education  2020 [employer].

## 2020-04-21 NOTE — Assessment & Plan Note (Signed)
Patient is a 79 year old female who presents to clinic today  after A.fib.  Elevated blood pressure and heart rate. Weight, blood pressure, and PT/INR checks required by Coumadin clinic for 3 consecutive weeks. Patient is not currently having any signs and symptoms of A. fib.  Patient is reporting no palpitations, chest pain, orthopnea, dizziness, syncope or lower extremity edema.  Patient is tolerating current medication with no side effects, patient is compliant and follows up as required. PT/INR completed, patient is within normal range today.  Patient will follow up in 1 week, and two consecutive weeks. Education provided to patient for  PT/INR checks, and  understanding A. fib. Patient knows to follow-up with any worsening or unresolved symptoms of A. fib.  Printed handout given.

## 2020-04-21 NOTE — Addendum Note (Signed)
Addended by: Karle Plumber on: 04/21/2020 04:57 PM   Modules accepted: Orders

## 2020-04-21 NOTE — Assessment & Plan Note (Signed)
PT/INR test completed, patient within normal range.  Patient will repeat test for 3 weeks.

## 2020-04-22 LAB — BMP8+EGFR
BUN/Creatinine Ratio: 16 (ref 12–28)
BUN: 16 mg/dL (ref 8–27)
CO2: 27 mmol/L (ref 20–29)
Calcium: 9.1 mg/dL (ref 8.7–10.3)
Chloride: 99 mmol/L (ref 96–106)
Creatinine, Ser: 1.01 mg/dL — ABNORMAL HIGH (ref 0.57–1.00)
GFR calc Af Amer: 60 mL/min/{1.73_m2} (ref >59)
GFR calc non Af Amer: 52 mL/min/{1.73_m2} — ABNORMAL LOW (ref >59)
Glucose: 101 mg/dL — ABNORMAL HIGH (ref 65–99)
Potassium: 3.5 mmol/L (ref 3.5–5.2)
Sodium: 141 mmol/L (ref 134–144)

## 2020-04-23 ENCOUNTER — Ambulatory Visit (INDEPENDENT_AMBULATORY_CARE_PROVIDER_SITE_OTHER): Payer: Medicare HMO | Admitting: Physician Assistant

## 2020-04-23 ENCOUNTER — Encounter: Payer: Self-pay | Admitting: Physician Assistant

## 2020-04-23 ENCOUNTER — Other Ambulatory Visit: Payer: Self-pay

## 2020-04-23 VITALS — BP 130/83 | HR 93 | Temp 97.9°F | Resp 20 | Ht 61.0 in | Wt 128.5 lb

## 2020-04-23 DIAGNOSIS — R1011 Right upper quadrant pain: Secondary | ICD-10-CM | POA: Diagnosis not present

## 2020-04-23 NOTE — Patient Instructions (Signed)
Cholelithiasis  Cholelithiasis is also called "gallstones." It is a kind of gallbladder disease. The gallbladder is an organ that stores a liquid (bile) that helps you digest fat. Gallstones may not cause symptoms (may be silent gallstones) until they cause a blockage, and then they can cause pain (gallbladder attack). Follow these instructions at home:  Take over-the-counter and prescription medicines only as told by your doctor.  Stay at a healthy weight.  Eat healthy foods. This includes: ? Eating fewer fatty foods, like fried foods. ? Eating fewer refined carbs (refined carbohydrates). Refined carbs are breads and grains that are highly processed, like white bread and white rice. Instead, choose whole grains like whole-wheat bread and brown rice. ? Eating more fiber. Almonds, fresh fruit, and beans are healthy sources of fiber.  Keep all follow-up visits as told by your doctor. This is important. Contact a doctor if:  You have sudden pain in the upper right side of your belly (abdomen). Pain might spread to your right shoulder or your chest. This may be a sign of a gallbladder attack.  You feel sick to your stomach (are nauseous).  You throw up (vomit).  You have been diagnosed with gallstones that have no symptoms and you get: ? Belly pain. ? Discomfort, burning, or fullness in the upper part of your belly (indigestion). Get help right away if:  You have sudden pain in the upper right side of your belly, and it lasts for more than 2 hours.  You have belly pain that lasts for more than 5 hours.  You have a fever or chills.  You keep feeling sick to your stomach or you keep throwing up.  Your skin or the whites of your eyes turn yellow (jaundice).  You have dark-colored pee (urine).  You have light-colored poop (stool). Summary  Cholelithiasis is also called "gallstones."  The gallbladder is an organ that stores a liquid (bile) that helps you digest fat.  Silent  gallstones are gallstones that do not cause symptoms.  A gallbladder attack may cause sudden pain in the upper right side of your belly. Pain might spread to your right shoulder or your chest. If this happens, contact your doctor.  If you have sudden pain in the upper right side of your belly that lasts for more than 2 hours, get help right away. This information is not intended to replace advice given to you by your health care provider. Make sure you discuss any questions you have with your health care provider. Document Revised: 10/07/2017 Document Reviewed: 07/11/2016 Elsevier Patient Education  2020 [employer] Inc[employer]  

## 2020-04-23 NOTE — Progress Notes (Signed)
  Subjective:     Patient ID: Norma Barajas, female   DOB: December 04, 1940, 79 y.o.   MRN: [id number]  HPI Pt with a several month hx of RUQ abd pain, bloating , loss of appetite, and nausea She denies any fever, chills, weight loss/gain, or change in bowel habits Denies any dark or tarry stools Has intermit diarrhea which she states is nl for her Tried Pepto x 1 and sx improved for a short period of time Currently on Prilosec 20mg  daily Sx worse with fried greasy foods  Review of Systems  Constitutional: Positive for appetite change. Negative for fatigue, fever and unexpected weight change.  Respiratory: Negative.   Cardiovascular: Negative.   Gastrointestinal: Positive for abdominal distention, abdominal pain, diarrhea and nausea. Negative for anal bleeding, blood in stool, constipation and vomiting.       Objective:   Physical Exam Vitals and nursing note reviewed.  Constitutional:      General: She is not in acute distress.    Appearance: Normal appearance. She is not ill-appearing or toxic-appearing.  Abdominal:     General: There is no distension.     Palpations: Abdomen is soft. There is mass.     Tenderness: There is abdominal tenderness. There is no right CVA tenderness, left CVA tenderness, guarding or rebound.     Comments: + RUQ TTP but no epigastric TTP  Neurological:     Mental Status: She is alert.        Assessment:     1. RUQ abdominal pain        Plan:     Offered GBUS regarding sx but pt prefers to see GI spec Offered to do both but pt prefers just the appt Discussed staying away from fried, greasy, and fatty foods Continue with all current meds Referral made today F/U prn

## 2020-04-28 ENCOUNTER — Other Ambulatory Visit: Payer: Self-pay | Admitting: Family Medicine

## 2020-04-28 ENCOUNTER — Telehealth: Payer: Self-pay | Admitting: Family Medicine

## 2020-04-28 ENCOUNTER — Telehealth (INDEPENDENT_AMBULATORY_CARE_PROVIDER_SITE_OTHER): Payer: Self-pay | Admitting: Physician Assistant

## 2020-04-28 DIAGNOSIS — I482 Chronic atrial fibrillation, unspecified: Secondary | ICD-10-CM

## 2020-04-28 NOTE — Telephone Encounter (Signed)
Pt called regarding her referral to see Gastroenterologist and said that she requested to see Dr Laural Golden, instead of Dr [name]. Pt requested that correction be made to referral.

## 2020-04-28 NOTE — Telephone Encounter (Signed)
Patient keeps calling our office regarding a referral - she is a Glass blower/designer patient - we do not see each others patients - thanks

## 2020-04-28 NOTE — Telephone Encounter (Signed)
Called and notified patient that referral has been changed to Dr. Olevia Perches office

## 2020-04-29 ENCOUNTER — Encounter: Payer: Self-pay | Admitting: Family Medicine

## 2020-04-29 ENCOUNTER — Ambulatory Visit (INDEPENDENT_AMBULATORY_CARE_PROVIDER_SITE_OTHER): Payer: Medicare HMO | Admitting: Family Medicine

## 2020-04-29 ENCOUNTER — Other Ambulatory Visit: Payer: Self-pay

## 2020-04-29 DIAGNOSIS — I4891 Unspecified atrial fibrillation: Secondary | ICD-10-CM | POA: Diagnosis not present

## 2020-04-29 DIAGNOSIS — Z7901 Long term (current) use of anticoagulants: Secondary | ICD-10-CM | POA: Diagnosis not present

## 2020-04-29 LAB — COAGUCHEK XS/INR WAIVED
INR: 2.7 — ABNORMAL HIGH (ref 0.9–1.1)
Prothrombin Time: 32.3 s

## 2020-04-29 NOTE — Progress Notes (Signed)
Assessment & Plan:  1-2. Atrial fibrillation, unspecified type (HCC)/Chronic anticoagulation Description   Continue taking warfarin 1.5 mg on Tuesday and 3 mg on all other days.   INR was 2.7 today (goal is 2.0 to 3.0). Next INR in 1 week.     - CoaguChek XS/INR Waived   Return as scheduled next week.  [name], MSN, APRN, FNP-C [facility]  Subjective:    Patient ID: Norma Barajas, female    DOB: 01/21/1941, 79 y.o.   MRN: [id number]  Patient Care Team: Loman Brooklyn, FNP as PCP - General (Family Medicine) Minus Breeding, MD as PCP - Cardiology (Cardiology) [name], MD (Inactive) as Attending Physician (Gastroenterology) Melina Schools, OD as Consulting Physician (Optometry)   Chief Complaint:  Chief Complaint  Patient presents with  . Coagulation Disorder    HPI: Norma Barajas is a 79 y.o. female presenting on 04/29/2020 for Coagulation Disorder  Anticoagulation: Patient here for anticoagulation monitoring. She will be scheduled for TEE/cardioversion after 3 weekly therapeutic INR results. Her last INR was 2.3 last week. Today is her 2nd weekly INR.  Indication: atrial fibrillation Bleeding Signs/Symptoms:  None Thromboembolic Signs/Symptoms:  None  Missed Coumadin Doses:  None Medication Changes:  no Dietary Changes:  no Bacterial/Viral Infection:  no   New complaints: None  Social history:  Relevant past medical, surgical, family and social history reviewed and updated as indicated. Interim medical history since our last visit reviewed.  Allergies and medications reviewed and updated.  DATA REVIEWED: CHART IN EPIC  ROS: Negative unless specifically indicated above in HPI.    Current Outpatient Medications:  .  amLODipine (NORVASC) 10 MG tablet, Take 0.5 tablets (5 mg total) by mouth daily., Disp: 90 tablet, Rfl: 3 .  Calcium Carbonate-Vitamin D (CALCIUM-VITAMIN D3 PO), Take 1 tablet by mouth daily., Disp: , Rfl:  .   cholecalciferol (VITAMIN D3) 25 MCG (1000 UT) tablet, Take 1,000 Units by mouth daily., Disp: , Rfl:  .  furosemide (LASIX) 20 MG tablet, Take 1 tablet (20 mg total) by mouth daily., Disp: 30 tablet, Rfl: 0 .  metoprolol tartrate (LOPRESSOR) 25 MG tablet, Take 1.5 tablets (37.5 mg total) by mouth 2 (two) times daily., Disp: 180 tablet, Rfl: 3 .  Multiple Vitamin (MULTIVITAMIN) capsule, Take 1 capsule by mouth every evening., Disp: , Rfl:  .  Omega-3 Fatty Acids (FISH OIL) 1000 MG CAPS, Take 2,000 mg by mouth 2 (two) times daily., Disp: , Rfl:  .  omeprazole (PRILOSEC) 20 MG capsule, Take 1 capsule (20 mg total) by mouth daily., Disp: 90 capsule, Rfl: 3 .  potassium chloride (KLOR-CON) 10 MEQ tablet, Take 1 tablet (10 mEq total) by mouth daily., Disp: 30 tablet, Rfl: 0 .  simvastatin (ZOCOR) 40 MG tablet, Take 1 tablet (40 mg total) by mouth daily., Disp: 90 tablet, Rfl: 2 .  sotalol (BETAPACE) 80 MG tablet, Take 1 tablet (80 mg total) by mouth daily., Disp: 90 tablet, Rfl: 1 .  warfarin (COUMADIN) 3 MG tablet, TAKE 1/2 TO 1 TABLET EVERY DAY AS DIRECTED BY  ANTICOAGULATION CLINIC, Disp: 90 tablet, Rfl: 0   Allergies  Allergen Reactions  . Codeine Nausea And Vomiting  . Doxycycline Other (See Comments)    Chest congestion  . Triamterene-Hctz Other (See Comments)    weakness  . Atorvastatin Other (See Comments)    Myalgias   . Crestor [Rosuvastatin Calcium] Other (See Comments)    weakness  . Morphine  Nausea Only  . Risedronate Sodium Other (See Comments)    ACTONEL REACTION: reflux   Past Medical History:  Diagnosis Date  . 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 Anxiety   . Arthritis    "right leg" (04/11/2015)  . BPPV (benign paroxysmal positional vertigo) 03/23/2016  . Cataract   . Coronary atherosclerosis of native coronary artery    a. Nonobstructive minimal CAD 10/2005.  Marland Kitchen Depression   . Diastolic dysfunction    Grade 1. Ejection fraction 60-65%.  Marland Kitchen Dysrhythmia     a fib  . Erosive esophagitis   . Essential hypertension   . GERD (gastroesophageal reflux disease)   . Grade III hemorrhoids   . History of hiatal hernia   . Hypercholesterolemia   . Internal hemorrhoids with complication 06/25/2992   OCT 2015 FLEX SIG/IH BANDING    . Migraine    "used to have them right bad; I don't now" (04/11/2015)  . MITRAL REGURGITATION 04/27/2010   Qualifier: Diagnosis of  By: [name], MD, [name]   . Osteoporosis   . Paroxysmal atrial fibrillation (HCC)   . PONV (postoperative nausea and vomiting)   . Vertigo     Past Surgical History:  Procedure Laterality Date  . BRAVO [place] STUDY  11/15/2012   Procedure: BRAVO [place];  Surgeon: [name], MD;  [facility];  Service: Endoscopy;;  . CATARACT EXTRACTION W/ INTRAOCULAR LENS  IMPLANT, BILATERAL Bilateral   . COLONOSCOPY  2008   Dr. [name]: internal hemorrhoids   . DILATION AND CURETTAGE OF UTERUS    . ESOPHAGOGASTRODUODENOSCOPY (EGD) WITH ESOPHAGEAL DILATION  2001   Dr. [name]: erosive esophagitis, esophageal stricture, duodenitis, s/p Savary dilation  . ESOPHAGOGASTRODUODENOSCOPY (EGD) WITH ESOPHAGEAL DILATION  11/15/2012   [id number] web was found & MOST LIKELY CAUSE FOR DYAPHAGIA/Polyp was found in the gastric body and gastric fundus/ gastritis on bx  . EYE SURGERY Bilateral    "laser OR after cataract OR; cause I couldn't see"  . FLEXIBLE SIGMOIDOSCOPY N/A 08/22/2014   mild diverticulosis in sigmoid, moderate sized Grade 3 hemorrhoids s/p banding X 3.   Marland Kitchen FRACTURE SURGERY Right    below the knee - 2 bones broke has plates  . HEMORRHOID BANDING N/A 08/22/2014   Procedure: HEMORRHOID BANDING;  Surgeon: [name], MD;  [facility];  Service: Endoscopy;  Laterality: N/A;  . HEMORRHOID SURGERY N/A 03/30/2018   Procedure: EXTENSIVE HEMORRHOIDECTOMY;  Surgeon: [name], MD;  Location: [facility];  Service: General;  Laterality: N/A;  . OPEN REDUCTION INTERNAL  FIXATION (ORIF) TIBIA/FIBULA FRACTURE Right 2013   broke tibia and fibula after falling down stairs  . PROLAPSED UTERINE FIBROID LIGATION  2015  . TOTAL ABDOMINAL HYSTERECTOMY      Social History   Socioeconomic History  . Marital status: Married    Spouse name: george   . Number of children: 1  . Years of education: Not on file  . Highest education level: Not on file  Occupational History  . Occupation: Retired    Comment: Textile  Tobacco Use  . Smoking status: Never Smoker  . Smokeless tobacco: Never Used  Vaping Use  . Vaping Use: Never used  Substance and Sexual Activity  . Alcohol use: No    Alcohol/week: 0.0 standard drinks  . Drug use: No  . Sexual activity: Not Currently    Birth control/protection: Surgical, Post-menopausal    Comment: hyst  Other  Topics Concern  . Not on file  Social History Narrative   Married   No regular exercise   Social Determinants of Health   Financial Resource Strain:   . Difficulty of Paying Living Expenses:   Food Insecurity:   . Worried About Charity fundraiser in the Last Year:   . Arboriculturist in the Last Year:   Transportation Needs:   . Film/video editor (Medical):   Marland Kitchen Lack of Transportation (Non-Medical):   Physical Activity:   . Days of Exercise per Week:   . Minutes of Exercise per Session:   Stress:   . Feeling of Stress :   Social Connections:   . Frequency of Communication with Friends and Family:   . Frequency of Social Gatherings with Friends and Family:   . Attends Religious Services:   . Active Member of Clubs or Organizations:   . Attends Archivist Meetings:   Marland Kitchen Marital Status:   Intimate Partner Violence:   . Fear of Current or Ex-Partner:   . Emotionally Abused:   Marland Kitchen Physically Abused:   . Sexually Abused:         Objective:    BP 122/86   Pulse (!) 116   Temp (!) 97.4 F (36.3 C) (Temporal)   Ht 5\' 1"  (1.549 m)   Wt 129 lb 6.4 oz (58.7 kg)   SpO2 91%   BMI 24.45 kg/m    Wt Readings from Last 3 Encounters:  04/29/20 129 lb 6.4 oz (58.7 kg)  04/23/20 128 lb 8 oz (58.3 kg)  04/21/20 131 lb (59.4 kg)    Physical Exam Vitals reviewed.  Constitutional:      General: She is not in acute distress.    Appearance: Normal appearance. She is normal weight. She is not ill-appearing, toxic-appearing or diaphoretic.  HENT:     Head: Normocephalic and atraumatic.  Eyes:     General: No scleral icterus.       Right eye: No discharge.        Left eye: No discharge.     Conjunctiva/sclera: Conjunctivae normal.  Cardiovascular:     Rate and Rhythm: Normal rate and regular rhythm.     Heart sounds: Normal heart sounds. No murmur heard.  No friction rub. No gallop.   Pulmonary:     Effort: Pulmonary effort is normal. No respiratory distress.     Breath sounds: Normal breath sounds. No stridor. No wheezing, rhonchi or rales.  Musculoskeletal:        General: Normal range of motion.     Cervical back: Normal range of motion.  Skin:    General: Skin is warm and dry.     Capillary Refill: Capillary refill takes less than 2 seconds.  Neurological:     General: No focal deficit present.     Mental Status: She is alert and oriented to person, place, and time. Mental status is at baseline.  Psychiatric:        Mood and Affect: Mood normal.        Behavior: Behavior normal.        Thought Content: Thought content normal.        Judgment: Judgment normal.     Lab Results  Component Value Date   TSH 1.483 04/10/2015   Lab Results  Component Value Date   WBC 6.0 12/05/2019   HGB 15.7 12/05/2019   HCT 47.1 (H) 12/05/2019   MCV 95 12/05/2019  PLT 181 12/05/2019   Lab Results  Component Value Date   NA 141 04/21/2020   K 3.5 04/21/2020   CO2 27 04/21/2020   GLUCOSE 101 (H) 04/21/2020   BUN 16 04/21/2020   CREATININE 1.01 (H) 04/21/2020   BILITOT 0.3 12/05/2019   ALKPHOS 86 12/05/2019   AST 22 12/05/2019   ALT 14 12/05/2019   PROT 7.5 12/05/2019    ALBUMIN 4.6 12/05/2019   CALCIUM 9.1 04/21/2020   ANIONGAP 13 07/10/2019   Lab Results  Component Value Date   CHOL 186 12/05/2019   Lab Results  Component Value Date   HDL 48 12/05/2019   Lab Results  Component Value Date   LDLCALC 98 12/05/2019   Lab Results  Component Value Date   TRIG 235 (H) 12/05/2019   Lab Results  Component Value Date   CHOLHDL 3.9 12/05/2019   Lab Results  Component Value Date   HGBA1C 5.6 02/28/2017

## 2020-05-06 ENCOUNTER — Encounter (HOSPITAL_COMMUNITY): Payer: Self-pay

## 2020-05-06 ENCOUNTER — Telehealth (HOSPITAL_COMMUNITY): Payer: Self-pay | Admitting: *Deleted

## 2020-05-06 ENCOUNTER — Other Ambulatory Visit (HOSPITAL_COMMUNITY): Payer: Self-pay | Admitting: *Deleted

## 2020-05-06 ENCOUNTER — Ambulatory Visit (INDEPENDENT_AMBULATORY_CARE_PROVIDER_SITE_OTHER): Payer: Medicare HMO | Admitting: Family Medicine

## 2020-05-06 ENCOUNTER — Encounter: Payer: Self-pay | Admitting: Family Medicine

## 2020-05-06 ENCOUNTER — Other Ambulatory Visit: Payer: Self-pay

## 2020-05-06 VITALS — BP 114/81 | HR 107 | Temp 97.4°F | Ht 61.0 in | Wt 128.0 lb

## 2020-05-06 DIAGNOSIS — R600 Localized edema: Secondary | ICD-10-CM

## 2020-05-06 DIAGNOSIS — R399 Unspecified symptoms and signs involving the genitourinary system: Secondary | ICD-10-CM | POA: Diagnosis not present

## 2020-05-06 DIAGNOSIS — Z7901 Long term (current) use of anticoagulants: Secondary | ICD-10-CM

## 2020-05-06 DIAGNOSIS — I4891 Unspecified atrial fibrillation: Secondary | ICD-10-CM | POA: Diagnosis not present

## 2020-05-06 LAB — URINALYSIS, COMPLETE
Bilirubin, UA: NEGATIVE
Glucose, UA: NEGATIVE
Ketones, UA: NEGATIVE
Leukocytes,UA: NEGATIVE
Nitrite, UA: NEGATIVE
Protein,UA: NEGATIVE
Specific Gravity, UA: <1.005 — ABNORMAL LOW (ref 1.005–1.030)
Urobilinogen, Ur: 0.2 mg/dL (ref 0.2–1.0)
pH, UA: 5.5 (ref 5.0–7.5)

## 2020-05-06 LAB — BMP8+EGFR
BUN/Creatinine Ratio: 12 (ref 12–28)
BUN: 12 mg/dL (ref 8–27)
CO2: 25 mmol/L (ref 20–29)
Calcium: 9.2 mg/dL (ref 8.7–10.3)
Chloride: 98 mmol/L (ref 96–106)
Creatinine, Ser: 0.98 mg/dL (ref 0.57–1.00)
GFR calc Af Amer: 63 mL/min/{1.73_m2} (ref >59)
GFR calc non Af Amer: 54 mL/min/{1.73_m2} — ABNORMAL LOW (ref >59)
Glucose: 113 mg/dL — ABNORMAL HIGH (ref 65–99)
Potassium: 3.5 mmol/L (ref 3.5–5.2)
Sodium: 140 mmol/L (ref 134–144)

## 2020-05-06 LAB — MICROSCOPIC EXAMINATION
Bacteria, UA: NONE SEEN
Renal Epithel, UA: NONE SEEN /hpf

## 2020-05-06 LAB — COAGUCHEK XS/INR WAIVED
INR: 3 — ABNORMAL HIGH (ref 0.9–1.1)
Prothrombin Time: 36.6 s

## 2020-05-06 NOTE — Telephone Encounter (Signed)
I have you scheduled for cardioversion on Friday, July 9th  Have your INR checked as scheduled for 7/6 and again on 7/9 (need prior to arrival to hospital)  You will come to the main entrance of the hospital at 11:30am.  Nothing to eat or drink after midnight. Take a sip of water with your morning medications. You cannot drive home after the procedure.   COVID testing is required across from [facility] on Tuesday, July 6th @ 12:15 -  Go to [place] (follow the signs once there) for testing.  Pt verbalized understanding of all instructions. Follow up appt made for 7/20.

## 2020-05-06 NOTE — Addendum Note (Signed)
Addended by: Loman Brooklyn on: 05/06/2020 09:57 AM   Modules accepted: Orders

## 2020-05-06 NOTE — Progress Notes (Signed)
A fib clinic aware and verbalizes understanding.

## 2020-05-06 NOTE — Progress Notes (Signed)
Assessment & Plan:  1-2. Atrial fibrillation, unspecified type (HCC)/Chronic anticoagulation Description   Continue taking warfarin 1.5 mg on Tuesday and 3 mg on all other days.   INR was 3.0 today (goal is 2.0 to 3.0). Next INR in 1 week with cardiology.     - CoaguChek XS/INR Waived  3. Bilateral lower extremity edema - Patient reports she is taking furosemide 40 mg daily per cardiology recommendation. Encouraged elevation of legs and compression hose.  - BMP8+EGFR  4. UTI symptoms - Urinalysis, Complete   [name], MSN, APRN, FNP-C [facility]  Subjective:    Patient ID: Norma Barajas, female    DOB: [birth year], 79 y.o.   MRN: [id number]  Patient Care Team: Loman Brooklyn, FNP as PCP - General (Family Medicine) Minus Breeding, MD as PCP - Cardiology (Cardiology) [name], MD (Inactive) as Attending Physician (Gastroenterology) Melina Schools, OD as Consulting Physician (Optometry)   Chief Complaint:  Chief Complaint  Patient presents with   Coagulation Disorder   Foot Swelling    bilateral. Patient states it has been going on a few weeks.     HPI: Norma Barajas is a 79 y.o. female presenting on 05/06/2020 for Coagulation Disorder and Foot Swelling (bilateral. Patient states it has been going on a few weeks. )  Anticoagulation: Patient here for anticoagulation monitoring. She will be scheduled for TEE/cardioversion after 3 weekly therapeutic INR results. Her last INR was 2.7 last week. Today is her 3rd weekly INR.  Indication: atrial fibrillation Bleeding Signs/Symptoms:  None Thromboembolic Signs/Symptoms:  None  Missed Coumadin Doses:  None Medication Changes:  no Dietary Changes:  no Bacterial/Viral Infection:  no   New complaints: Patient c/o bilateral lower extremity edema for the past few weeks that has been worse for the past few days since she has been up on her feet at the hospital with her son, who just passed away  after being involved in a MVA.   She is concerned her kidneys are working well because she is not urinating much during the night. She is also experiencing some dysuria.   Social history:  Relevant past medical, surgical, family and social history reviewed and updated as indicated. Interim medical history since our last visit reviewed.  Allergies and medications reviewed and updated.  DATA REVIEWED: CHART IN EPIC  ROS: Negative unless specifically indicated above in HPI.    Current Outpatient Medications:    amLODipine (NORVASC) 10 MG tablet, Take 0.5 tablets (5 mg total) by mouth daily., Disp: 90 tablet, Rfl: 3   Calcium Carbonate-Vitamin D (CALCIUM-VITAMIN D3 PO), Take 1 tablet by mouth daily., Disp: , Rfl:    cholecalciferol (VITAMIN D3) 25 MCG (1000 UT) tablet, Take 1,000 Units by mouth daily., Disp: , Rfl:    furosemide (LASIX) 20 MG tablet, Take 1 tablet (20 mg total) by mouth daily., Disp: 30 tablet, Rfl: 0   metoprolol tartrate (LOPRESSOR) 25 MG tablet, Take 1.5 tablets (37.5 mg total) by mouth 2 (two) times daily., Disp: 180 tablet, Rfl: 3   Multiple Vitamin (MULTIVITAMIN) capsule, Take 1 capsule by mouth every evening., Disp: , Rfl:    Omega-3 Fatty Acids (FISH OIL) 1000 MG CAPS, Take 2,000 mg by mouth 2 (two) times daily., Disp: , Rfl:    omeprazole (PRILOSEC) 20 MG capsule, Take 1 capsule (20 mg total) by mouth daily., Disp: 90 capsule, Rfl: 3   potassium chloride (KLOR-CON) 10 MEQ tablet, Take 1 tablet (10 mEq  total) by mouth daily., Disp: 30 tablet, Rfl: 0   simvastatin (ZOCOR) 40 MG tablet, Take 1 tablet (40 mg total) by mouth daily., Disp: 90 tablet, Rfl: 2   sotalol (BETAPACE) 80 MG tablet, Take 1 tablet (80 mg total) by mouth daily., Disp: 90 tablet, Rfl: 1   warfarin (COUMADIN) 3 MG tablet, TAKE 1/2 TO 1 TABLET EVERY DAY AS DIRECTED BY  ANTICOAGULATION CLINIC, Disp: 90 tablet, Rfl: 0   Allergies  Allergen Reactions   Codeine Nausea And Vomiting    Doxycycline Other (See Comments)    Chest congestion   Triamterene-Hctz Other (See Comments)    weakness   Atorvastatin Other (See Comments)    Myalgias    Crestor [Rosuvastatin Calcium] Other (See Comments)    weakness   Morphine Nausea Only   Risedronate Sodium Other (See Comments)    ACTONEL REACTION: reflux   Past Medical History:  Diagnosis Date   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 Nonobstructive minimal CAD 10/2005.   Depression    Diastolic dysfunction    Grade 1. Ejection fraction 60-65%.   Dysrhythmia    a fib   Erosive esophagitis    Essential hypertension    GERD (gastroesophageal reflux disease)    Grade III hemorrhoids    History of hiatal hernia    Hypercholesterolemia    Internal hemorrhoids with complication 02/07/271   OCT 2015 FLEX SIG/IH BANDING     Migraine    "used to have them right bad; I don't now" (04/11/2015)   MITRAL REGURGITATION 04/27/2010   Qualifier: Diagnosis of  By: [name], MD, [name]    Osteoporosis    Paroxysmal atrial fibrillation ([place])    PONV (postoperative nausea and vomiting)    Vertigo     Past Surgical History:  Procedure Laterality Date   BRAVO [place] STUDY  11/15/2012   Procedure: BRAVO [place];  Surgeon: [name], MD;  [facility];  Service: Endoscopy;;   CATARACT EXTRACTION W/ INTRAOCULAR LENS  IMPLANT, BILATERAL Bilateral    COLONOSCOPY  2008   Dr. [name]: internal hemorrhoids    DILATION AND CURETTAGE OF UTERUS     ESOPHAGOGASTRODUODENOSCOPY (EGD) WITH ESOPHAGEAL DILATION  2001   Dr. [name]: erosive esophagitis, esophageal stricture, duodenitis, s/p Savary dilation   ESOPHAGOGASTRODUODENOSCOPY (EGD) WITH ESOPHAGEAL DILATION  11/15/2012   [id number] web was found & MOST  LIKELY CAUSE FOR DYAPHAGIA/Polyp was found in the gastric body and gastric fundus/ gastritis on bx   EYE SURGERY Bilateral    "laser OR after cataract OR; cause I couldn't see"   FLEXIBLE SIGMOIDOSCOPY N/A 08/22/2014   mild diverticulosis in sigmoid, moderate sized Grade 3 hemorrhoids s/p banding X 3.    FRACTURE SURGERY Right    below the knee - 2 bones broke has plates   HEMORRHOID BANDING N/A 08/22/2014   Procedure: HEMORRHOID BANDING;  Surgeon: [name], MD;  [facility];  Service: Endoscopy;  Laterality: N/A;   HEMORRHOID SURGERY N/A 03/30/2018   Procedure: EXTENSIVE HEMORRHOIDECTOMY;  Surgeon: [name], MD;  Location: [facility];  Service: General;  Laterality: N/A;   OPEN REDUCTION INTERNAL FIXATION (ORIF) TIBIA/FIBULA FRACTURE Right 2013   broke tibia and fibula after falling down stairs  PROLAPSED UTERINE FIBROID LIGATION  2015   TOTAL ABDOMINAL HYSTERECTOMY      Social History   Socioeconomic History   Marital status: Married    Spouse name: george    Number of children: 1   Years of education: Not on file   Highest education level: Not on file  Occupational History   Occupation: Retired    Comment: Textile  Tobacco Use   Smoking status: Never Smoker   Smokeless tobacco: Never Used  Scientific laboratory technician Use: Never used  Substance and Sexual Activity   Alcohol use: No    Alcohol/week: 0.0 standard drinks   Drug use: No   Sexual activity: Not Currently    Birth control/protection: Surgical, Post-menopausal    Comment: hyst  Other Topics Concern   Not on file  Social History Narrative   Married   No regular exercise   Social Determinants of Health   Financial Resource Strain:    Difficulty of Paying Living Expenses:   Food Insecurity:    Worried About Charity fundraiser in the Last Year:    Arboriculturist in the Last Year:   Transportation Needs:    Film/video editor (Medical):    Lack of  Transportation (Non-Medical):   Physical Activity:    Days of Exercise per Week:    Minutes of Exercise per Session:   Stress:    Feeling of Stress :   Social Connections:    Frequency of Communication with Friends and Family:    Frequency of Social Gatherings with Friends and Family:    Attends Religious Services:    Active Member of Clubs or Organizations:    Attends Archivist Meetings:    Marital Status:   Intimate Partner Violence:    Fear of Current or Ex-Partner:    Emotionally Abused:    Physically Abused:    Sexually Abused:         Objective:    BP 114/81    Pulse (!) 107    Temp (!) 97.4 F (36.3 C) (Temporal)    Ht 5' 1"  (1.549 m)    Wt 128 lb (58.1 kg)    SpO2 93%    BMI 24.19 kg/m   Wt Readings from Last 3 Encounters:  05/06/20 128 lb (58.1 kg)  04/29/20 129 lb 6.4 oz (58.7 kg)  04/23/20 128 lb 8 oz (58.3 kg)    Physical Exam Vitals reviewed.  Constitutional:      General: She is not in acute distress.    Appearance: Normal appearance. She is normal weight. She is not ill-appearing, toxic-appearing or diaphoretic.  HENT:     Head: Normocephalic and atraumatic.  Eyes:     General: No scleral icterus.       Right eye: No discharge.        Left eye: No discharge.     Conjunctiva/sclera: Conjunctivae normal.  Cardiovascular:     Rate and Rhythm: Tachycardia present. Rhythm irregular.     Heart sounds: Normal heart sounds. No murmur heard.  No friction rub. No gallop.   Pulmonary:     Effort: Pulmonary effort is normal. No respiratory distress.     Breath sounds: Normal breath sounds. No stridor. No wheezing, rhonchi or rales.  Musculoskeletal:        General: Normal range of motion.     Cervical back: Normal range of motion.     Right lower leg: Edema present.  Left lower leg: Edema present.  Skin:    General: Skin is warm and dry.     Capillary Refill: Capillary refill takes less than 2 seconds.  Neurological:      General: No focal deficit present.     Mental Status: She is alert and oriented to person, place, and time. Mental status is at baseline.  Psychiatric:        Mood and Affect: Mood normal.        Behavior: Behavior normal.        Thought Content: Thought content normal.        Judgment: Judgment normal.     Lab Results  Component Value Date   TSH 1.483 04/10/2015   Lab Results  Component Value Date   WBC 6.0 12/05/2019   HGB 15.7 12/05/2019   HCT 47.1 (H) 12/05/2019   MCV 95 12/05/2019   PLT 181 12/05/2019   Lab Results  Component Value Date   NA 141 04/21/2020   K 3.5 04/21/2020   CO2 27 04/21/2020   GLUCOSE 101 (H) 04/21/2020   BUN 16 04/21/2020   CREATININE 1.01 (H) 04/21/2020   BILITOT 0.3 12/05/2019   ALKPHOS 86 12/05/2019   AST 22 12/05/2019   ALT 14 12/05/2019   PROT 7.5 12/05/2019   ALBUMIN 4.6 12/05/2019   CALCIUM 9.1 04/21/2020   ANIONGAP 13 07/10/2019   Lab Results  Component Value Date   CHOL 186 12/05/2019   Lab Results  Component Value Date   HDL 48 12/05/2019   Lab Results  Component Value Date   LDLCALC 98 12/05/2019   Lab Results  Component Value Date   TRIG 235 (H) 12/05/2019   Lab Results  Component Value Date   CHOLHDL 3.9 12/05/2019   Lab Results  Component Value Date   HGBA1C 5.6 02/28/2017

## 2020-05-07 LAB — URINE CULTURE: Organism ID, Bacteria: NO GROWTH

## 2020-05-07 NOTE — Telephone Encounter (Signed)
Please get patient scheduled in our office for these INRs next week.

## 2020-05-07 NOTE — Telephone Encounter (Signed)
Pt is scheduled INR 7/6 at 1000 Then covid test at 1215pm  On Friday 7/9- 1000 am here  Then hosp at 1130 am

## 2020-05-13 ENCOUNTER — Other Ambulatory Visit (HOSPITAL_COMMUNITY)
Admission: RE | Admit: 2020-05-13 | Discharge: 2020-05-13 | Disposition: A | Discharge status: Home / Self Care | Payer: Medicare HMO | Source: Ambulatory Visit | Attending: Cardiology | Admitting: Cardiology

## 2020-05-13 ENCOUNTER — Other Ambulatory Visit: Payer: Self-pay

## 2020-05-13 ENCOUNTER — Ambulatory Visit (INDEPENDENT_AMBULATORY_CARE_PROVIDER_SITE_OTHER): Payer: Medicare HMO | Admitting: Family Medicine

## 2020-05-13 ENCOUNTER — Encounter: Payer: Self-pay | Admitting: Family Medicine

## 2020-05-13 DIAGNOSIS — Z7901 Long term (current) use of anticoagulants: Secondary | ICD-10-CM | POA: Diagnosis not present

## 2020-05-13 DIAGNOSIS — Z20822 Contact with and (suspected) exposure to covid-19: Secondary | ICD-10-CM | POA: Insufficient documentation

## 2020-05-13 DIAGNOSIS — I4891 Unspecified atrial fibrillation: Secondary | ICD-10-CM | POA: Diagnosis not present

## 2020-05-13 DIAGNOSIS — Z01812 Encounter for preprocedural laboratory examination: Secondary | ICD-10-CM | POA: Insufficient documentation

## 2020-05-13 LAB — SARS CORONAVIRUS 2 (TAT 6-24 HRS): SARS Coronavirus 2: NEGATIVE

## 2020-05-13 LAB — COAGUCHEK XS/INR WAIVED
INR: 2.6 — ABNORMAL HIGH (ref 0.9–1.1)
Prothrombin Time: 30.9 s

## 2020-05-13 NOTE — Progress Notes (Signed)
Assessment & Plan:  1-2. Atrial fibrillation, unspecified type (HCC)/Chronic anticoagulation Description   Continue taking warfarin 1.5 mg on Tuesday and 3 mg on all other days.   INR was 2.6 today (goal is 2.0 to 3.0). Next INR on Friday, prior to cardioversion.      - CoaguChek XS/INR Waived   Return as scheduled, for INR.  [name], MSN, APRN, FNP-C [facility]  Subjective:    Patient ID: Norma Barajas, female    DOB: 10/08/1941, 79 y.o.   MRN: [id number]  Patient Care Team: Loman Brooklyn, FNP as PCP - General (Family Medicine) Minus Breeding, MD as PCP - Cardiology (Cardiology) [name], MD (Inactive) as Attending Physician (Gastroenterology) Melina Schools, OD as Consulting Physician (Optometry)   Chief Complaint:  Chief Complaint  Patient presents with   Coagulation Disorder    HPI: Norma Barajas is a 79 y.o. female presenting on 05/13/2020 for Coagulation Disorder  Anticoagulation: Patient here for anticoagulation monitoring. She is scheduled for cardioversion on Friday and needs INR today and Friday prior to appointment.  Indication: atrial fibrillation Bleeding Signs/Symptoms:  None Thromboembolic Signs/Symptoms:  None  Missed Coumadin Doses:  None Medication Changes:  no Dietary Changes:  no Bacterial/Viral Infection:  no  New complaints: None  Social history:  Relevant past medical, surgical, family and social history reviewed and updated as indicated. Interim medical history since our last visit reviewed.  Allergies and medications reviewed and updated.  DATA REVIEWED: CHART IN EPIC  ROS: Negative unless specifically indicated above in HPI.    Current Outpatient Medications:    acetaminophen (TYLENOL) 500 MG tablet, Take 1,000 mg by mouth every 6 (six) hours as needed for moderate pain or headache., Disp: , Rfl:    amLODipine (NORVASC) 10 MG tablet, Take 0.5 tablets (5 mg total) by mouth daily. (Patient  taking differently: Take 10 mg by mouth every evening. ), Disp: 90 tablet, Rfl: 3   Calcium Carbonate-Vitamin D (CALCIUM-VITAMIN D3 PO), Take 1 tablet by mouth daily., Disp: , Rfl:    cholecalciferol (VITAMIN D3) 25 MCG (1000 UT) tablet, Take 1,000 Units by mouth daily., Disp: , Rfl:    furosemide (LASIX) 20 MG tablet, Take 1 tablet (20 mg total) by mouth daily. (Patient taking differently: Take 40 mg by mouth daily with breakfast. ), Disp: 30 tablet, Rfl: 0   loratadine (CLARITIN) 10 MG tablet, Take 10 mg by mouth daily as needed for allergies., Disp: , Rfl:    metoprolol tartrate (LOPRESSOR) 25 MG tablet, Take 1.5 tablets (37.5 mg total) by mouth 2 (two) times daily., Disp: 180 tablet, Rfl: 3   Multiple Vitamin (MULTIVITAMIN) capsule, Take 1 capsule by mouth every evening., Disp: , Rfl:    Omega-3 Fatty Acids (FISH OIL) 1000 MG CAPS, Take 1,000 mg by mouth 2 (two) times daily. , Disp: , Rfl:    omeprazole (PRILOSEC) 20 MG capsule, Take 1 capsule (20 mg total) by mouth daily., Disp: 90 capsule, Rfl: 3   potassium chloride (KLOR-CON) 10 MEQ tablet, Take 1 tablet (10 mEq total) by mouth daily. (Patient taking differently: Take 20 mEq by mouth daily with breakfast. ), Disp: 30 tablet, Rfl: 0   simvastatin (ZOCOR) 40 MG tablet, Take 1 tablet (40 mg total) by mouth daily. (Patient taking differently: Take 40 mg by mouth at bedtime. ), Disp: 90 tablet, Rfl: 2   sotalol (BETAPACE) 80 MG tablet, Take 1 tablet (80 mg total) by mouth daily., Disp:  90 tablet, Rfl: 1   warfarin (COUMADIN) 3 MG tablet, TAKE 1/2 TO 1 TABLET EVERY DAY AS DIRECTED BY  ANTICOAGULATION CLINIC (Patient taking differently: Take 1.5-3 mg by mouth See admin instructions. 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 Nonobstructive minimal CAD 10/2005.   Depression    Diastolic dysfunction    Grade 1. Ejection fraction 60-65%.   Dysrhythmia    a fib   Erosive esophagitis    Essential hypertension    GERD (gastroesophageal reflux disease)    Grade III hemorrhoids    History of hiatal hernia    Hypercholesterolemia    Internal hemorrhoids with complication 5/46/2703   OCT 2015 FLEX SIG/IH BANDING     Migraine    "used to have them right bad; I don't now" (04/11/2015)   MITRAL REGURGITATION 04/27/2010   Qualifier: Diagnosis of  By: [name], MD, Norma Barajas    Osteoporosis    Paroxysmal atrial fibrillation ([place])    PONV (postoperative nausea and vomiting)    Vertigo     Past Surgical History:  Procedure Laterality Date   BRAVO [place] STUDY  11/15/2012   Procedure: BRAVO [place];  Surgeon: [name], MD;  Location: [facility];  Service: Endoscopy;;   CATARACT EXTRACTION W/ INTRAOCULAR LENS  IMPLANT, BILATERAL Bilateral    COLONOSCOPY  2008   Dr. [name]: internal hemorrhoids    DILATION AND CURETTAGE OF UTERUS     ESOPHAGOGASTRODUODENOSCOPY (EGD) WITH ESOPHAGEAL DILATION  2001   Dr. [name]: erosive esophagitis, esophageal stricture, duodenitis, s/p Savary dilation   ESOPHAGOGASTRODUODENOSCOPY (EGD) WITH ESOPHAGEAL DILATION  11/15/2012   [id number] web was found & MOST LIKELY CAUSE FOR DYAPHAGIA/Polyp was found in the gastric body and gastric fundus/ gastritis on bx   EYE  SURGERY Bilateral    "laser OR after cataract OR; cause I couldn't see"   FLEXIBLE SIGMOIDOSCOPY N/A 08/22/2014   mild diverticulosis in sigmoid, moderate sized Grade 3 hemorrhoids s/p banding X 3.    FRACTURE SURGERY Right    below the knee - 2 bones broke has plates   HEMORRHOID BANDING N/A 08/22/2014   Procedure: HEMORRHOID BANDING;  Surgeon: [name], MD;  Location: [facility];  Service: Endoscopy;  Laterality: N/A;   HEMORRHOID SURGERY N/A 03/30/2018   Procedure: EXTENSIVE HEMORRHOIDECTOMY;  Surgeon: [name], MD;  Location: [facility] ORS;  Service: General;  Laterality: N/A;   OPEN REDUCTION INTERNAL FIXATION (ORIF) TIBIA/FIBULA FRACTURE Right 2013   broke tibia and fibula after falling down stairs   PROLAPSED UTERINE FIBROID LIGATION  2015   TOTAL ABDOMINAL HYSTERECTOMY      Social History   Socioeconomic  History   Marital status: Married    Spouse name: george    Number of children: 1   Years of education: Not on file   Highest education level: Not on file  Occupational History   Occupation: Retired    Comment: Textile  Tobacco Use   Smoking status: Never Smoker   Smokeless tobacco: Never Used  Scientific laboratory technician Use: Never used  Substance and Sexual Activity   Alcohol use: No    Alcohol/week: 0.0 standard drinks   Drug use: No   Sexual activity: Not Currently    Birth control/protection: Surgical, Post-menopausal    Comment: hyst  Other Topics Concern   Not on file  Social History Narrative   Married   No regular exercise   Social Determinants of Health   Financial Resource Strain:    Difficulty of Paying Living Expenses:   Food Insecurity:    Worried About Charity fundraiser in the Last Year:    Arboriculturist in the Last Year:   Transportation Needs:    Film/video editor (Medical):    Lack of Transportation (Non-Medical):   Physical Activity:    Days of Exercise per Week:    Minutes of Exercise per  Session:   Stress:    Feeling of Stress :   Social Connections:    Frequency of Communication with Friends and Family:    Frequency of Social Gatherings with Friends and Family:    Attends Religious Services:    Active Member of Clubs or Organizations:    Attends Archivist Meetings:    Marital Status:   Intimate Partner Violence:    Fear of Current or Ex-Partner:    Emotionally Abused:    Physically Abused:    Sexually Abused:         Objective:    BP 115/81    Pulse (!) 104    Temp (!) 97.3 F (36.3 C) (Temporal)    Ht 5\' 1"  (1.549 m)    Wt 127 lb 12.8 oz (58 kg)    SpO2 (!) 89%    BMI 24.15 kg/m   Wt Readings from Last 3 Encounters:  05/13/20 127 lb 12.8 oz (58 kg)  05/06/20 128 lb (58.1 kg)  04/29/20 129 lb 6.4 oz (58.7 kg)    Physical Exam Vitals reviewed.  Constitutional:      General: She is not in acute distress.    Appearance: Normal appearance. She is normal weight. She is not ill-appearing, toxic-appearing or diaphoretic.  HENT:     Head: Normocephalic and atraumatic.  Eyes:     General: No scleral icterus.       Right eye: No discharge.        Left eye: No discharge.     Conjunctiva/sclera: Conjunctivae normal.  Cardiovascular:     Rate and Rhythm: Normal rate.  Pulmonary:     Effort: Pulmonary effort is normal. No respiratory distress.  Musculoskeletal:        General: Normal range of motion.     Cervical back: Normal range of motion.  Skin:    General: Skin is warm and dry.     Capillary Refill: Capillary refill takes less than 2 seconds.  Neurological:     General: No focal deficit present.     Mental Status: She is alert and oriented to person, place, and time. Mental status is at baseline.  Psychiatric:        Mood  and Affect: Mood normal.        Behavior: Behavior normal.        Thought Content: Thought content normal.        Judgment: Judgment normal.     Lab Results  Component Value Date   TSH 1.483 04/10/2015    Lab Results  Component Value Date   WBC 6.0 12/05/2019   HGB 15.7 12/05/2019   HCT 47.1 (H) 12/05/2019   MCV 95 12/05/2019   PLT 181 12/05/2019   Lab Results  Component Value Date   NA 140 05/06/2020   K 3.5 05/06/2020   CO2 25 05/06/2020   GLUCOSE 113 (H) 05/06/2020   BUN 12 05/06/2020   CREATININE 0.98 05/06/2020   BILITOT 0.3 12/05/2019   ALKPHOS 86 12/05/2019   AST 22 12/05/2019   ALT 14 12/05/2019   PROT 7.5 12/05/2019   ALBUMIN 4.6 12/05/2019   CALCIUM 9.2 05/06/2020   ANIONGAP 13 07/10/2019   Lab Results  Component Value Date   CHOL 186 12/05/2019   Lab Results  Component Value Date   HDL 48 12/05/2019   Lab Results  Component Value Date   LDLCALC 98 12/05/2019   Lab Results  Component Value Date   TRIG 235 (H) 12/05/2019   Lab Results  Component Value Date   CHOLHDL 3.9 12/05/2019   Lab Results  Component Value Date   HGBA1C 5.6 02/28/2017

## 2020-05-14 ENCOUNTER — Other Ambulatory Visit: Payer: Self-pay | Admitting: Family Medicine

## 2020-05-16 ENCOUNTER — Ambulatory Visit: Payer: Medicare HMO | Admitting: Family Medicine

## 2020-05-16 ENCOUNTER — Encounter: Payer: Self-pay | Admitting: Family Medicine

## 2020-05-16 ENCOUNTER — Ambulatory Visit (HOSPITAL_COMMUNITY): Payer: Medicare HMO | Admitting: Anesthesiology

## 2020-05-16 ENCOUNTER — Other Ambulatory Visit: Payer: Self-pay

## 2020-05-16 ENCOUNTER — Encounter (HOSPITAL_COMMUNITY): Payer: Self-pay | Admitting: Cardiology

## 2020-05-16 ENCOUNTER — Encounter (HOSPITAL_COMMUNITY)
Admission: RE | Disposition: A | Discharge status: Home / Self Care | Payer: Self-pay | Source: Ambulatory Visit | Attending: Cardiology

## 2020-05-16 ENCOUNTER — Ambulatory Visit (HOSPITAL_COMMUNITY)
Admission: RE | Admit: 2020-05-16 | Discharge: 2020-05-16 | Disposition: A | Discharge status: Home / Self Care | Payer: Medicare HMO | Source: Ambulatory Visit | Attending: Cardiology | Admitting: Cardiology

## 2020-05-16 ENCOUNTER — Ambulatory Visit (INDEPENDENT_AMBULATORY_CARE_PROVIDER_SITE_OTHER): Payer: Medicare HMO | Admitting: Family Medicine

## 2020-05-16 VITALS — BP 130/95 | HR 113 | Temp 97.3°F | Ht 61.0 in | Wt 128.0 lb

## 2020-05-16 DIAGNOSIS — I4891 Unspecified atrial fibrillation: Secondary | ICD-10-CM

## 2020-05-16 DIAGNOSIS — I4819 Other persistent atrial fibrillation: Secondary | ICD-10-CM | POA: Insufficient documentation

## 2020-05-16 DIAGNOSIS — Z79899 Other long term (current) drug therapy: Secondary | ICD-10-CM | POA: Diagnosis not present

## 2020-05-16 DIAGNOSIS — I10 Essential (primary) hypertension: Secondary | ICD-10-CM | POA: Insufficient documentation

## 2020-05-16 DIAGNOSIS — M810 Age-related osteoporosis without current pathological fracture: Secondary | ICD-10-CM | POA: Insufficient documentation

## 2020-05-16 DIAGNOSIS — I2510 Atherosclerotic heart disease of native coronary artery without angina pectoris: Secondary | ICD-10-CM | POA: Diagnosis not present

## 2020-05-16 DIAGNOSIS — D333 Benign neoplasm of cranial nerves: Secondary | ICD-10-CM | POA: Diagnosis not present

## 2020-05-16 DIAGNOSIS — Z8262 Family history of osteoporosis: Secondary | ICD-10-CM | POA: Insufficient documentation

## 2020-05-16 DIAGNOSIS — Z885 Allergy status to narcotic agent status: Secondary | ICD-10-CM | POA: Insufficient documentation

## 2020-05-16 DIAGNOSIS — R1011 Right upper quadrant pain: Secondary | ICD-10-CM

## 2020-05-16 DIAGNOSIS — E7800 Pure hypercholesterolemia, unspecified: Secondary | ICD-10-CM | POA: Diagnosis not present

## 2020-05-16 DIAGNOSIS — Z888 Allergy status to other drugs, medicaments and biological substances status: Secondary | ICD-10-CM | POA: Insufficient documentation

## 2020-05-16 DIAGNOSIS — K219 Gastro-esophageal reflux disease without esophagitis: Secondary | ICD-10-CM | POA: Insufficient documentation

## 2020-05-16 DIAGNOSIS — Z881 Allergy status to other antibiotic agents status: Secondary | ICD-10-CM | POA: Diagnosis not present

## 2020-05-16 DIAGNOSIS — Z7901 Long term (current) use of anticoagulants: Secondary | ICD-10-CM | POA: Diagnosis not present

## 2020-05-16 DIAGNOSIS — Z8249 Family history of ischemic heart disease and other diseases of the circulatory system: Secondary | ICD-10-CM | POA: Diagnosis not present

## 2020-05-16 DIAGNOSIS — E785 Hyperlipidemia, unspecified: Secondary | ICD-10-CM | POA: Diagnosis not present

## 2020-05-16 HISTORY — PX: CARDIOVERSION: SHX1299

## 2020-05-16 LAB — COAGUCHEK XS/INR WAIVED
INR: 2.1 — ABNORMAL HIGH (ref 0.9–1.1)
Prothrombin Time: 25.6 s

## 2020-05-16 SURGERY — CARDIOVERSION
Anesthesia: General

## 2020-05-16 MED ORDER — SODIUM CHLORIDE 0.9 % IV SOLN: INTRAVENOUS | Status: DC | PRN

## 2020-05-16 MED ORDER — LIDOCAINE 2% (20 MG/ML) 5 ML SYRINGE
INTRAMUSCULAR | Status: DC | PRN
  Administered 2020-05-16: 50 mg via INTRAVENOUS

## 2020-05-16 MED ORDER — PROPOFOL 10 MG/ML IV BOLUS
INTRAVENOUS | Status: DC | PRN
  Administered 2020-05-16: 50 mg via INTRAVENOUS

## 2020-05-16 NOTE — Discharge Instructions (Signed)
Electrical Cardioversion Electrical cardioversion is the delivery of a jolt of electricity to restore a normal rhythm to the heart. A rhythm that is too fast or is not regular keeps the heart from pumping well. In this procedure, sticky patches or metal paddles are placed on the chest to deliver electricity to the heart from a device.  What can I expect after the procedure?  Your blood pressure, heart rate, breathing rate, and blood oxygen level will be monitored until you leave the hospital or clinic.  Your heart rhythm will be watched to make sure it does not change.  You may have some redness on the skin where the shocks were given.  Follow these instructions at home:  Do not drive for 24 hours if you were given a sedative during your procedure.  Take over-the-counter and prescription medicines only as told by your health care provider.  Ask your health care provider how to check your pulse. Check it often.  Rest for 48 hours after the procedure or as told by your health care provider.  Avoid or limit your caffeine use as told by your health care provider.  Keep all follow-up visits as told by your health care provider. This is important.  Contact a health care provider if:  You feel like your heart is beating too quickly or your pulse is not regular.  You have a serious muscle cramp that does not go away.  Get help right away if:  You have discomfort in your chest.  You are dizzy or you feel faint.  You have trouble breathing or you are short of breath.  Your speech is slurred.  You have trouble moving an arm or leg on one side of your body.  Your fingers or toes turn cold or blue.  Summary  Electrical cardioversion is the delivery of a jolt of electricity to restore a normal rhythm to the heart.  This procedure may be done right away in an emergency or may be a scheduled procedure if the condition is not an emergency.  Generally, this is a safe  procedure.  After the procedure, check your pulse often as told by your health care provider.  This information is not intended to replace advice given to you by your health care provider. Make sure you discuss any questions you have with your health care provider. Document Revised: 05/28/2019 Document Reviewed: 05/28/2019 Elsevier Patient Education  2020 Elsevier Inc.  

## 2020-05-16 NOTE — Transfer of Care (Signed)
Immediate Anesthesia Transfer of Care Note  Patient: Norma Barajas  Procedure(s) Performed: CARDIOVERSION (N/A )  Patient Location: Endoscopy Unit  Anesthesia Type:General  Level of Consciousness: awake and alert   Airway & Oxygen Therapy: Patient Spontanous Breathing  Post-op Assessment: Report given to RN and Post -op Vital signs reviewed and stable  Post vital signs: Reviewed and stable  Last Vitals:  Vitals Value Taken Time  BP    Temp    Pulse    Resp    SpO2     HR 100, RR 20, sats 95, BP 106/67  Last Pain:  Vitals:   05/16/20 1154  TempSrc: Oral  PainSc: 0-No pain         Complications: No complications documented.

## 2020-05-16 NOTE — Interval H&P Note (Signed)
History and Physical Interval Note:  05/16/2020 12:32 PM  Norma Barajas  has presented today for surgery, with the diagnosis of A-FIB.  The various methods of treatment have been discussed with the patient and family. After consideration of risks, benefits and other options for treatment, the patient has consented to  Procedure(s): CARDIOVERSION (N/A) as a surgical intervention.  The patient's history has been reviewed, patient examined, no change in status, stable for surgery.  I have reviewed the patient's chart and labs.  Questions were answered to the patient's satisfaction.     Donato Heinz

## 2020-05-16 NOTE — Anesthesia Postprocedure Evaluation (Signed)
Anesthesia Post Note  Patient: Norma Barajas  Procedure(s) Performed: CARDIOVERSION (N/A )     Patient location during evaluation: Endoscopy Anesthesia Type: General Level of consciousness: awake and alert Pain management: pain level controlled Vital Signs Assessment: post-procedure vital signs reviewed and stable Respiratory status: spontaneous breathing, nonlabored ventilation, respiratory function stable and patient connected to nasal cannula oxygen Cardiovascular status: blood pressure returned to baseline and stable Postop Assessment: no apparent nausea or vomiting Anesthetic complications: no   No complications documented.  Last Vitals:  Vitals:   05/16/20 1300 05/16/20 1310  BP: 114/78 (!) 135/93  Pulse: (!) 113 (!) 129  Resp: (!) 27 (!) 24  Temp:    SpO2: 96% 94%    Last Pain:  Vitals:   05/16/20 1310  TempSrc:   PainSc: 0-No pain                 Barnet [name]

## 2020-05-16 NOTE — Progress Notes (Signed)
Assessment & Plan:  1. Atrial fibrillation, unspecified type [facility]) Description   Continue taking warfarin 1.5 mg on Tuesday and 3 mg on all other days.   INR was 2.1 today (goal is 2.0 to 3.0). Next INR in 6 weeks.      - CoaguChek XS/INR Waived  2. RUQ abdominal pain - Offered to order RUQ ultrasound but patient declined. She will let me know if she changes her mind.    Return in about 6 weeks (around 06/27/2020) for INR.  [name], MSN, APRN, FNP-C [facility]  Subjective:    Patient ID: Norma Barajas, female    DOB: [birth year], 79 y.o.   MRN: [id number]  Patient Care Team: Loman Brooklyn, FNP as PCP - General (Family Medicine) Minus Breeding, MD as PCP - Cardiology (Cardiology) [name], MD (Inactive) as Attending Physician (Gastroenterology) Melina Schools, OD as Consulting Physician (Optometry)   Chief Complaint:  Chief Complaint  Patient presents with  . Coagulation Disorder    HPI: Norma Barajas is a 79 y.o. female presenting on 05/16/2020 for Coagulation Disorder  Anticoagulation: Patient here for anticoagulation monitoring. She is scheduled for cardioversion today and needs her last INR prior to her appointment.  Indication: atrial fibrillation Bleeding Signs/Symptoms:  None Thromboembolic Signs/Symptoms:  None  Missed Coumadin Doses:  None Medication Changes:  no Dietary Changes:  no Bacterial/Viral Infection:  no  New complaints: Patient is concerned that she continues to have right upper quadrant abdominal pain and diarrhea with greasy foods and her appointment with the gastroenterologist is not until September.   Social history:  Relevant past medical, surgical, family and social history reviewed and updated as indicated. Interim medical history since our last visit reviewed.  Allergies and medications reviewed and updated.  DATA REVIEWED: CHART IN EPIC  ROS: Negative unless specifically indicated above in HPI.     Current Outpatient Medications:  .  acetaminophen (TYLENOL) 500 MG tablet, Take 1,000 mg by mouth every 6 (six) hours as needed for moderate pain or headache., Disp: , Rfl:  .  amLODipine (NORVASC) 10 MG tablet, Take 0.5 tablets (5 mg total) by mouth daily. (Patient taking differently: Take 10 mg by mouth every evening. ), Disp: 90 tablet, Rfl: 3 .  Calcium Carbonate-Vitamin D (CALCIUM-VITAMIN D3 PO), Take 1 tablet by mouth daily., Disp: , Rfl:  .  cholecalciferol (VITAMIN D3) 25 MCG (1000 UT) tablet, Take 1,000 Units by mouth daily., Disp: , Rfl:  .  furosemide (LASIX) 20 MG tablet, Take 1 tablet (20 mg total) by mouth daily. (Patient taking differently: Take 40 mg by mouth daily with breakfast. ), Disp: 30 tablet, Rfl: 0 .  loratadine (CLARITIN) 10 MG tablet, Take 10 mg by mouth daily as needed for allergies., Disp: , Rfl:  .  metoprolol tartrate (LOPRESSOR) 25 MG tablet, Take 1.5 tablets (37.5 mg total) by mouth 2 (two) times daily., Disp: 180 tablet, Rfl: 3 .  Multiple Vitamin (MULTIVITAMIN) capsule, Take 1 capsule by mouth every evening., Disp: , Rfl:  .  Omega-3 Fatty Acids (FISH OIL) 1000 MG CAPS, Take 1,000 mg by mouth 2 (two) times daily. , Disp: , Rfl:  .  omeprazole (PRILOSEC) 20 MG capsule, Take 1 capsule (20 mg total) by mouth daily., Disp: 90 capsule, Rfl: 3 .  potassium chloride (KLOR-CON) 10 MEQ tablet, Take 1 tablet (10 mEq total) by mouth daily. (Patient taking differently: Take 20 mEq by mouth daily with breakfast. ), Disp:  30 tablet, Rfl: 0 .  simvastatin (ZOCOR) 40 MG tablet, Take 1 tablet (40 mg total) by mouth daily. (Patient taking differently: Take 40 mg by mouth at bedtime. ), Disp: 90 tablet, Rfl: 2 .  sotalol (BETAPACE) 80 MG tablet, TAKE 1 TABLET EVERY DAY, Disp: 90 tablet, Rfl: 1 .  warfarin (COUMADIN) 3 MG tablet, TAKE 1/2 TO 1 TABLET EVERY DAY AS DIRECTED BY  ANTICOAGULATION CLINIC (Patient taking differently: Take 1.5-3 mg by mouth See admin instructions. Take 3  mg daily at night except take 1.5 mg at night on Tuesdays), Disp: 90 tablet, Rfl: 0   Allergies  Allergen Reactions  . Codeine Nausea And Vomiting  . Doxycycline Other (See Comments)    Chest congestion  . Triamterene-Hctz Other (See Comments)    weakness  . Atorvastatin Other (See Comments)    Myalgias   . Crestor [Rosuvastatin Calcium] Other (See Comments)    weakness  . Morphine Nausea Only  . Risedronate Sodium Other (See Comments)    ACTONEL - reflux   Past Medical History:  Diagnosis Date  . 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 Anxiety   . Arthritis    "right leg" (04/11/2015)  . BPPV (benign paroxysmal positional vertigo) 03/23/2016  . Cataract   . Coronary atherosclerosis of native coronary artery    a. Nonobstructive minimal CAD 10/2005.  Marland Kitchen Depression   . Diastolic dysfunction    Grade 1. Ejection fraction 60-65%.  Marland Kitchen Dysrhythmia    a fib  . Erosive esophagitis   . Essential hypertension   . GERD (gastroesophageal reflux disease)   . Grade III hemorrhoids   . History of hiatal hernia   . Hypercholesterolemia   . Internal hemorrhoids with complication 06/18/9146   OCT 2015 FLEX SIG/IH BANDING    . Migraine    "used to have them right bad; I don't now" (04/11/2015)  . MITRAL REGURGITATION 04/27/2010   Qualifier: Diagnosis of  By: [name], MD, [name]   . Osteoporosis   . Paroxysmal atrial fibrillation (HCC)   . PONV (postoperative nausea and vomiting)   . Vertigo     Past Surgical History:  Procedure Laterality Date  . BRAVO [place] STUDY  11/15/2012   Procedure: BRAVO [place];  Surgeon: [name], MD;  Location: [facility] END[facility] SUITE;  Service: Endoscopy;;  . CATARACT EXTRACTION W/ INTRAOCULAR LENS  IMPLANT, BILATERAL Bilateral   . COLONOSCOPY  2008   Dr. [name]: internal hemorrhoids   . DILATION AND CURETTAGE OF UTERUS    . ESOPHAGOGASTRODUODENOSCOPY (EGD) WITH ESOPHAGEAL DILATION  2001   Dr. [name]: erosive esophagitis, esophageal stricture,  duodenitis, s/p Savary dilation  . ESOPHAGOGASTRODUODENOSCOPY (EGD) WITH ESOPHAGEAL DILATION  11/15/2012   WGN:[id number] web was found & MOST LIKELY CAUSE FOR DYAPHAGIA/Polyp was found in the gastric body and gastric fundus/ gastritis on bx  . EYE SURGERY Bilateral    "laser OR after cataract OR; cause I couldn't see"  . FLEXIBLE SIGMOIDOSCOPY N/A 08/22/2014   mild diverticulosis in sigmoid, moderate sized Grade 3 hemorrhoids s/p banding X 3.   Marland Kitchen FRACTURE SURGERY Right    below the knee - 2 bones broke has plates  . HEMORRHOID BANDING N/A 08/22/2014   Procedure: HEMORRHOID BANDING;  Surgeon: [name], MD;  Location: [facility] END[facility];  Service: Endoscopy;  Laterality: N/A;  . HEMORRHOID SURGERY N/A 03/30/2018   Procedure: EXTENSIVE HEMORRHOIDECTOMY;  Surgeon: [name], MD;  Location: [facility] ORS;  Service: General;  Laterality: N/A;  . OPEN REDUCTION INTERNAL FIXATION (ORIF) TIBIA/FIBULA FRACTURE Right 2013   broke tibia and fibula after falling down stairs  . PROLAPSED UTERINE FIBROID LIGATION  2015  . TOTAL ABDOMINAL HYSTERECTOMY      Social History   Socioeconomic History  . Marital status: Married    Spouse name: george   . Number of children: 1  . Years of education: Not on file  . Highest education level: Not on file  Occupational History  . Occupation: Retired    Comment: Textile  Tobacco Use  . Smoking status: Never Smoker  . Smokeless tobacco: Never Used  Vaping Use  . Vaping Use: Never used  Substance and Sexual Activity  . Alcohol use: No    Alcohol/week: 0.0 standard drinks  . Drug use: No  . Sexual activity: Not Currently    Birth control/protection: Surgical, Post-menopausal    Comment: hyst  Other Topics Concern  . Not on file  Social History Narrative   Married   No regular exercise   Social Determinants of Health   Financial Resource Strain:   . Difficulty of Paying Living Expenses:   Food Insecurity:   . Worried About Charity fundraiser in  the Last Year:   . Arboriculturist in the Last Year:   Transportation Needs:   . Film/video editor (Medical):   Marland Kitchen Lack of Transportation (Non-Medical):   Physical Activity:   . Days of Exercise per Week:   . Minutes of Exercise per Session:   Stress:   . Feeling of Stress :   Social Connections:   . Frequency of Communication with Friends and Family:   . Frequency of Social Gatherings with Friends and Family:   . Attends Religious Services:   . Active Member of Clubs or Organizations:   . Attends Archivist Meetings:   Marland Kitchen Marital Status:   Intimate Partner Violence:   . Fear of Current or Ex-Partner:   . Emotionally Abused:   Marland Kitchen Physically Abused:   . Sexually Abused:         Objective:    BP (!) 130/95   Pulse (!) 113   Temp (!) 97.3 F (36.3 C) (Temporal)   Ht 5\' 1"  (1.549 m)   Wt 128 lb (58.1 kg)   SpO2 95%   BMI 24.19 kg/m   Wt Readings from Last 3 Encounters:  05/16/20 128 lb (58.1 kg)  05/13/20 127 lb 12.8 oz (58 kg)  05/06/20 128 lb (58.1 kg)    Physical Exam Vitals reviewed.  Constitutional:      General: She is not in acute distress.    Appearance: Normal appearance. She is normal weight. She is not ill-appearing, toxic-appearing or diaphoretic.  HENT:     Head: Normocephalic and atraumatic.  Eyes:     General: No scleral icterus.       Right eye: No discharge.        Left eye: No discharge.     Conjunctiva/sclera: Conjunctivae normal.  Cardiovascular:     Rate and Rhythm: Normal rate.  Pulmonary:     Effort: Pulmonary effort is normal. No respiratory distress.  Musculoskeletal:        General: Normal range of motion.     Cervical back: Normal range of motion.  Skin:    General: Skin is warm and dry.     Capillary Refill: Capillary refill takes less than 2 seconds.  Neurological:  General: No focal deficit present.     Mental Status: She is alert and oriented to person, place, and time. Mental status is at baseline.   Psychiatric:        Mood and Affect: Mood normal.        Behavior: Behavior normal.        Thought Content: Thought content normal.        Judgment: Judgment normal.     Lab Results  Component Value Date   TSH 1.483 04/10/2015   Lab Results  Component Value Date   WBC 6.0 12/05/2019   HGB 15.7 12/05/2019   HCT 47.1 (H) 12/05/2019   MCV 95 12/05/2019   PLT 181 12/05/2019   Lab Results  Component Value Date   NA 140 05/06/2020   K 3.5 05/06/2020   CO2 25 05/06/2020   GLUCOSE 113 (H) 05/06/2020   BUN 12 05/06/2020   CREATININE 0.98 05/06/2020   BILITOT 0.3 12/05/2019   ALKPHOS 86 12/05/2019   AST 22 12/05/2019   ALT 14 12/05/2019   PROT 7.5 12/05/2019   ALBUMIN 4.6 12/05/2019   CALCIUM 9.2 05/06/2020   ANIONGAP 13 07/10/2019   Lab Results  Component Value Date   CHOL 186 12/05/2019   Lab Results  Component Value Date   HDL 48 12/05/2019   Lab Results  Component Value Date   LDLCALC 98 12/05/2019   Lab Results  Component Value Date   TRIG 235 (H) 12/05/2019   Lab Results  Component Value Date   CHOLHDL 3.9 12/05/2019   Lab Results  Component Value Date   HGBA1C 5.6 02/28/2017

## 2020-05-16 NOTE — Anesthesia Preprocedure Evaluation (Addendum)
Anesthesia Evaluation  Patient identified by MRN, date of birth, ID band Patient awake    Reviewed: Allergy & Precautions, NPO status , Patient's Chart, lab work & pertinent test results  History of Anesthesia Complications (+) PONV  Airway Mallampati: II  TM Distance: >3 FB Neck ROM: Full    Dental no notable dental hx. (+) Edentulous Upper, Dental Advisory Given   Pulmonary neg pulmonary ROS,    Pulmonary exam normal breath sounds clear to auscultation       Cardiovascular hypertension, + CAD  Normal cardiovascular exam+ dysrhythmias Atrial Fibrillation  Rhythm:Irregular Rate:Normal     Neuro/Psych  Headaches, Anxiety Depression  Neuromuscular disease    GI/Hepatic Neg liver ROS, hiatal hernia, PUD, GERD  ,  Endo/Other  negative endocrine ROS  Renal/GU negative Renal ROS     Musculoskeletal  (+) Arthritis ,   Abdominal   Peds  Hematology negative hematology ROS (+)   Anesthesia Other Findings   Reproductive/Obstetrics                            Anesthesia Physical Anesthesia Plan  ASA: III  Anesthesia Plan: General   Post-op Pain Management:    Induction: Intravenous  PONV Risk Score and Plan: Treatment may vary due to age or medical condition  Airway Management Planned: Nasal Cannula and Natural Airway  Additional Equipment: None  Intra-op Plan:   Post-operative Plan:   Informed Consent:     Dental advisory given  Plan Discussed with:   Anesthesia Plan Comments:         Anesthesia Quick Evaluation

## 2020-05-16 NOTE — CV Procedure (Addendum)
Procedure:   DCCV  Indication:  Symptomatic atrial fibrillation  Procedure Note:  The patient signed informed consent.  They have had had therapeutic anticoagulation with warfarin greater than 3 weeks.  Anesthesia was administered by Dr. Valma Cava.  Adequate airway was maintained throughout and vital followed per protocol.  They were cardioverted x 1 with 200J of biphasic synchronized energy.  They converted to NSR.  There were no apparent complications.  The patient had normal neuro status and respiratory status post procedure with vitals stable as recorded elsewhere.    Patient was maintaining sinus rhythm and pads were removed.  While in recovery unfortunately she converted back to atrial fibrillation.  Follow up:  They will continue on current medical therapy and follow up with cardiology as scheduled.  Oswaldo Milian, MD 05/16/2020 12:38 PM

## 2020-05-18 ENCOUNTER — Encounter (HOSPITAL_COMMUNITY): Payer: Self-pay | Admitting: Cardiology

## 2020-05-19 ENCOUNTER — Other Ambulatory Visit (HOSPITAL_COMMUNITY): Payer: Self-pay | Admitting: *Deleted

## 2020-05-19 MED ORDER — POTASSIUM CHLORIDE ER 10 MEQ PO TBCR: 20.0000 meq | EXTENDED_RELEASE_TABLET | Freq: Every day | ORAL | 3 refills | Status: DC

## 2020-05-19 MED ORDER — FUROSEMIDE 40 MG PO TABS: 40.0000 mg | ORAL_TABLET | Freq: Every day | ORAL | 3 refills | Status: DC

## 2020-05-20 ENCOUNTER — Ambulatory Visit (HOSPITAL_COMMUNITY)
Admission: RE | Admit: 2020-05-20 | Discharge: 2020-05-20 | Disposition: A | Discharge status: Home / Self Care | Payer: Medicare HMO | Source: Ambulatory Visit | Attending: Nurse Practitioner | Admitting: Nurse Practitioner

## 2020-05-20 ENCOUNTER — Encounter (HOSPITAL_COMMUNITY): Payer: Self-pay | Admitting: Nurse Practitioner

## 2020-05-20 ENCOUNTER — Other Ambulatory Visit: Payer: Self-pay

## 2020-05-20 VITALS — BP 130/96 | HR 129 | Ht 61.0 in | Wt 127.4 lb

## 2020-05-20 DIAGNOSIS — Z9114 Patient's other noncompliance with medication regimen: Secondary | ICD-10-CM | POA: Insufficient documentation

## 2020-05-20 DIAGNOSIS — Z8249 Family history of ischemic heart disease and other diseases of the circulatory system: Secondary | ICD-10-CM | POA: Insufficient documentation

## 2020-05-20 DIAGNOSIS — Z823 Family history of stroke: Secondary | ICD-10-CM | POA: Insufficient documentation

## 2020-05-20 DIAGNOSIS — E785 Hyperlipidemia, unspecified: Secondary | ICD-10-CM | POA: Insufficient documentation

## 2020-05-20 DIAGNOSIS — K2100 Gastro-esophageal reflux disease with esophagitis, without bleeding: Secondary | ICD-10-CM | POA: Diagnosis not present

## 2020-05-20 DIAGNOSIS — J069 Acute upper respiratory infection, unspecified: Secondary | ICD-10-CM | POA: Diagnosis not present

## 2020-05-20 DIAGNOSIS — M810 Age-related osteoporosis without current pathological fracture: Secondary | ICD-10-CM | POA: Insufficient documentation

## 2020-05-20 DIAGNOSIS — Z83438 Family history of other disorder of lipoprotein metabolism and other lipidemia: Secondary | ICD-10-CM | POA: Insufficient documentation

## 2020-05-20 DIAGNOSIS — B9689 Other specified bacterial agents as the cause of diseases classified elsewhere: Secondary | ICD-10-CM | POA: Diagnosis not present

## 2020-05-20 DIAGNOSIS — Z881 Allergy status to other antibiotic agents status: Secondary | ICD-10-CM | POA: Diagnosis not present

## 2020-05-20 DIAGNOSIS — R600 Localized edema: Secondary | ICD-10-CM | POA: Diagnosis not present

## 2020-05-20 DIAGNOSIS — D6869 Other thrombophilia: Secondary | ICD-10-CM | POA: Diagnosis not present

## 2020-05-20 DIAGNOSIS — I4819 Other persistent atrial fibrillation: Secondary | ICD-10-CM | POA: Diagnosis not present

## 2020-05-20 DIAGNOSIS — Z884 Allergy status to anesthetic agent status: Secondary | ICD-10-CM | POA: Diagnosis not present

## 2020-05-20 DIAGNOSIS — I10 Essential (primary) hypertension: Secondary | ICD-10-CM | POA: Insufficient documentation

## 2020-05-20 DIAGNOSIS — I2510 Atherosclerotic heart disease of native coronary artery without angina pectoris: Secondary | ICD-10-CM | POA: Diagnosis not present

## 2020-05-20 DIAGNOSIS — Z888 Allergy status to other drugs, medicaments and biological substances status: Secondary | ICD-10-CM | POA: Diagnosis not present

## 2020-05-20 DIAGNOSIS — Z7901 Long term (current) use of anticoagulants: Secondary | ICD-10-CM | POA: Diagnosis not present

## 2020-05-20 LAB — COMPREHENSIVE METABOLIC PANEL
ALT: 18 U/L (ref 0–44)
AST: 28 U/L (ref 15–41)
Albumin: 3.8 g/dL (ref 3.5–5.0)
Alkaline Phosphatase: 62 U/L (ref 38–126)
Anion gap: 15 (ref 5–15)
BUN: 9 mg/dL (ref 8–23)
CO2: 26 mmol/L (ref 22–32)
Calcium: 8.8 mg/dL — ABNORMAL LOW (ref 8.9–10.3)
Chloride: 99 mmol/L (ref 98–111)
Creatinine, Ser: 1 mg/dL (ref 0.44–1.00)
GFR calc Af Amer: >60 mL/min (ref >60)
GFR calc non Af Amer: 53 mL/min — ABNORMAL LOW (ref >60)
Glucose, Bld: 117 mg/dL — ABNORMAL HIGH (ref 70–99)
Potassium: 3.3 mmol/L — ABNORMAL LOW (ref 3.5–5.1)
Sodium: 140 mmol/L (ref 135–145)
Total Bilirubin: 0.6 mg/dL (ref 0.3–1.2)
Total Protein: 6.7 g/dL (ref 6.5–8.1)

## 2020-05-20 LAB — TSH: TSH: 0.805 u[IU]/mL (ref 0.350–4.500)

## 2020-05-20 MED ORDER — AMIODARONE HCL 200 MG PO TABS
ORAL_TABLET | ORAL | 0 refills | Status: DC
Start: 2020-05-20 — End: 2020-06-05

## 2020-05-20 MED ORDER — SIMVASTATIN 40 MG PO TABS: 20.0000 mg | ORAL_TABLET | Freq: Every day | ORAL | 2 refills | Status: DC

## 2020-05-20 NOTE — Progress Notes (Addendum)
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 This correlates with the development of an URI, bronchitis that the pt reports was bordering  on pneumonia. She  was prescribed doxycycline but she stopped after a few days because she felt it was making her worse. Despite stopping antibiotic, she feels that she is improving form the URI. She feels shortness of breath, fatigue and feels like she is retaining fluid in her legs and abdomen.  She is in afib with RVR. She is on warfarin with a CHA2DS2VASc score of 4.   F/u in afib clinic, 7/13, after 4 weeks of therapeutic INR's, the pt proceeded to DCCV. She briefly converted but pt was back in afib before leaving the endo suite. She feels poorly in afib and would like to get back in SR. She has now failed  sotalol ,  tried Tikosyn in the past with the QT prolonging and too costly. She  used amiodarone remotely, stopped because she felt jittery, but this is the only antiarrythmic I have let to try,  so I feel it is worth a re challenge  of amiodarone. Pt is in agreement. She is in afib today at 129 bpm.   Today, she denies symptoms of palpitations, chest pain, shortness of breath, orthopnea, PND, lower extremity edema, dizziness, presyncope, syncope, or neurologic sequela. The patient is tolerating medications without difficulties and is otherwise without complaint today.   Past Medical History:  Diagnosis Date  . 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 Anxiety   . Arthritis    "right leg" (04/11/2015)  . BPPV (benign paroxysmal positional vertigo) 03/23/2016  . Cataract   . Coronary atherosclerosis of native coronary artery    a. Nonobstructive minimal CAD 10/2005.  Marland Kitchen Depression   . Diastolic dysfunction    Grade 1. Ejection fraction 60-65%.  Marland Kitchen  Dysrhythmia    a fib  . Erosive esophagitis   . Essential hypertension   . GERD (gastroesophageal reflux disease)   . Grade III hemorrhoids   . History of hiatal hernia   . Hypercholesterolemia   . Internal hemorrhoids with complication 3/81/0175   OCT 2015 FLEX SIG/IH BANDING    . Migraine    "used to have them right bad; I don't now" (04/11/2015)  . MITRAL REGURGITATION 04/27/2010   Qualifier: Diagnosis of  By: [name], MD, [name]   . Osteoporosis   . Paroxysmal atrial fibrillation (HCC)   . PONV (postoperative nausea and vomiting)   . Vertigo    Past Surgical History:  Procedure Laterality Date  . BRAVO [place] STUDY  11/15/2012   Procedure: BRAVO [place];  Surgeon: [name], MD;  Location: [facility];  Service: Endoscopy;;  . CARDIOVERSION N/A 05/16/2020   Procedure: CARDIOVERSION;  Surgeon: Donato Heinz, MD;  Location: [facility] ENDOSCOPY;  Service: Cardiovascular;  Laterality: N/A;  . CATARACT EXTRACTION W/ INTRAOCULAR LENS  IMPLANT, BILATERAL Bilateral   . COLONOSCOPY  2008   Dr. [name]: internal hemorrhoids   . DILATION AND CURETTAGE OF UTERUS    . ESOPHAGOGASTRODUODENOSCOPY (EGD) WITH ESOPHAGEAL DILATION  2001   Dr. [name]: erosive esophagitis, esophageal stricture, duodenitis, s/p Savary dilation  . ESOPHAGOGASTRODUODENOSCOPY (EGD) WITH ESOPHAGEAL DILATION  11/15/2012   ZWC:HENIDPOEUM web was  found & MOST LIKELY CAUSE FOR DYAPHAGIA/Polyp was found in the gastric body and gastric fundus/ gastritis on bx  . EYE SURGERY Bilateral    "laser OR after cataract OR; cause I couldn't see"  . FLEXIBLE SIGMOIDOSCOPY N/A 08/22/2014   mild diverticulosis in sigmoid, moderate sized Grade 3 hemorrhoids s/p banding X 3.   Marland Kitchen FRACTURE SURGERY Right    below the knee - 2 bones broke has plates  . HEMORRHOID BANDING N/A 08/22/2014   Procedure: HEMORRHOID BANDING;  Surgeon: [name], MD;  Location: [facility];  Service: Endoscopy;  Laterality: N/A;  . HEMORRHOID  SURGERY N/A 03/30/2018   Procedure: EXTENSIVE HEMORRHOIDECTOMY;  Surgeon: [name], MD;  Location: [facility] ORS;  Service: General;  Laterality: N/A;  . OPEN REDUCTION INTERNAL FIXATION (ORIF) TIBIA/FIBULA FRACTURE Right 2013   broke tibia and fibula after falling down stairs  . PROLAPSED UTERINE FIBROID LIGATION  2015  . TOTAL ABDOMINAL HYSTERECTOMY      Current Outpatient Medications  Medication Sig Dispense Refill  . acetaminophen (TYLENOL) 500 MG tablet Take 1,000 mg by mouth every 6 (six) hours as needed for moderate pain or headache.    Marland Kitchen amLODipine (NORVASC) 10 MG tablet Take 0.5 tablets (5 mg total) by mouth daily. (Patient taking differently: Take 10 mg by mouth every evening. ) 90 tablet 3  . Calcium Carbonate-Vitamin D (CALCIUM-VITAMIN D3 PO) Take 1 tablet by mouth daily.    . cholecalciferol (VITAMIN D3) 25 MCG (1000 UT) tablet Take 1,000 Units by mouth daily.    . furosemide (LASIX) 40 MG tablet Take 1 tablet (40 mg total) by mouth daily. 30 tablet 3  . loratadine (CLARITIN) 10 MG tablet Take 10 mg by mouth daily as needed for allergies.    . metoprolol tartrate (LOPRESSOR) 25 MG tablet Take 1.5 tablets (37.5 mg total) by mouth 2 (two) times daily. 180 tablet 3  . Multiple Vitamin (MULTIVITAMIN) capsule Take 1 capsule by mouth every evening.    . Omega-3 Fatty Acids (FISH OIL) 1000 MG CAPS Take 1,000 mg by mouth 2 (two) times daily.     Marland Kitchen omeprazole (PRILOSEC) 20 MG capsule Take 1 capsule (20 mg total) by mouth daily. 90 capsule 3  . potassium chloride (KLOR-CON) 10 MEQ tablet Take 2 tablets (20 mEq total) by mouth daily with breakfast. 60 tablet 3  . simvastatin (ZOCOR) 40 MG tablet Take 0.5 tablets (20 mg total) by mouth daily. 90 tablet 2  . warfarin (COUMADIN) 3 MG tablet TAKE 1/2 TO 1 TABLET EVERY DAY AS DIRECTED BY  ANTICOAGULATION CLINIC (Patient taking differently: Take 1.5-3 mg by mouth See admin instructions. Take 3 mg daily at night except take 1.5 mg at night on  Tuesdays) 90 tablet 0  . amiodarone (PACERONE) 200 MG tablet Take 1 tablet by mouth twice a day for 1 month then reduce to 1 tablet daily 60 tablet 0   No current facility-administered medications for this encounter.    Allergies  Allergen Reactions  . Codeine Nausea And Vomiting  . Doxycycline Other (See Comments)    Chest congestion  . Triamterene-Hctz Other (See Comments)    weakness  . Atorvastatin Other (See Comments)    Myalgias   . Crestor [Rosuvastatin Calcium] Other (See Comments)    weakness  . Morphine Nausea Only  . Risedronate Sodium Other (See Comments)    ACTONEL - reflux    Social History   Socioeconomic History  . Marital status: Married  Spouse name: george   . Number of children: 1  . Years of education: Not on file  . Highest education level: Not on file  Occupational History  . Occupation: Retired    Comment: Textile  Tobacco Use  . Smoking status: Never Smoker  . Smokeless tobacco: Never Used  Vaping Use  . Vaping Use: Never used  Substance and Sexual Activity  . Alcohol use: No    Alcohol/week: 0.0 standard drinks  . Drug use: No  . Sexual activity: Not Currently    Birth control/protection: Surgical, Post-menopausal    Comment: hyst  Other Topics Concern  . Not on file  Social History Narrative   Married   No regular exercise   Social Determinants of Health   Financial Resource Strain:   . Difficulty of Paying Living Expenses:   Food Insecurity:   . Worried About Charity fundraiser in the Last Year:   . Arboriculturist in the Last Year:   Transportation Needs:   . Film/video editor (Medical):   Marland Kitchen Lack of Transportation (Non-Medical):   Physical Activity:   . Days of Exercise per Week:   . Minutes of Exercise per Session:   Stress:   . Feeling of Stress :   Social Connections:   . Frequency of Communication with Friends and Family:   . Frequency of Social Gatherings with Friends and Family:   . Attends Religious  Services:   . Active Member of Clubs or Organizations:   . Attends Archivist Meetings:   Marland Kitchen Marital Status:   Intimate Partner Violence:   . Fear of Current or Ex-Partner:   . Emotionally Abused:   Marland Kitchen Physically Abused:   . Sexually Abused:     Family History  Problem Relation Age of Onset  . Colon cancer Mother 41  . Heart disease Mother   . Osteoporosis Mother   . Hip fracture Mother   . Stroke Sister   . Diabetes Sister   . Osteoporosis Sister   . Arthritis Sister   . Uterine cancer Sister   . Stroke Sister   . Heart disease Father   . Hyperlipidemia Brother   . Hypertension Brother   . Heart disease Brother   . Stroke Brother   . Heart disease Sister   . Dementia Sister   . Diabetes Son   . Stroke Son   . Heart attack Neg Hx     ROS- All systems are reviewed and negative except as per the HPI above  Physical Exam: Vitals:   05/20/20 1020  BP: (!) 130/96  Pulse: (!) 129  Weight: 57.8 kg  Height: 5\' 1"  (1.549 m)   Wt Readings from Last 3 Encounters:  05/20/20 57.8 kg  05/16/20 58.1 kg  05/13/20 58 kg    Labs: Lab Results  Component Value Date   NA 140 05/06/2020   K 3.5 05/06/2020   CL 98 05/06/2020   CO2 25 05/06/2020   GLUCOSE 113 (H) 05/06/2020   BUN 12 05/06/2020   CREATININE 0.98 05/06/2020   CALCIUM 9.2 05/06/2020   MG 2.3 02/13/2016   Lab Results  Component Value Date   INR 2.1 (H) 05/16/2020   Lab Results  Component Value Date   CHOL 186 12/05/2019   HDL 48 12/05/2019   LDLCALC 98 12/05/2019   TRIG 235 (H) 12/05/2019     GEN- The patient is well appearing, alert and oriented x 3 today.  Head- normocephalic, atraumatic Eyes-  Sclera clear, conjunctiva pink Ears- hearing intact Oropharynx- clear Neck- supple, no JVP Lymph- no cervical lymphadenopathy Lungs- Clear to ausculation bilaterally, normal work of breathing Heart- irregular rate and rhythm, no murmurs, rubs or gallops, PMI not laterally displaced GI- soft,  NT, ND, + BS Extremities- no clubbing, cyanosis, or 1t edema bilaterally MS- no significant deformity or atrophy Skin- no rash or lesion Psych- euthymic mood, full affect Neuro- strength and sensation are intact  EKG-afib at 129  Bpm, qrs int 68 ms, qtc 392 ms    Assessment and Plan: 1. Persistent afib with RVR Briefly successful DCCV for just a few minutes, ERAF Sotalol has failed  to keep pt in rhythm  She feels poorly  After discussion with pt , will re challenge amiodarone, felt jittery in the past with amiodarone but is the only option I have to restore SR  Did not tolerate tikosyn  In the past with long qt She will wash out sotalol for 3 days and then start amiodarone 200 mg bid  She will have a EKG in one week in the PCP office in [place] and then fax EKG to me  Continue metoprolol to 25 mg 1 1/2 tab bid  Baseline cmet/tsh today, prior CT of chest reviewed    2. HTN Stable   3. LLE Continue 20 mg lasix  daily and 10 meq Kt daily   4. CHA2DS2VASc score of 4 She is on warfarin followed in [place] at [facility]  Will make then aware that staring amiodarone I will continue weekly INR's   5. Hyperlipidemia Will reduce simvastatin to 20 mg daily, from 40 mg , with the addition of amiodarone   I will see bhack in the office in 2 weeks   Butch Penny C. Lamekia Nolden, [facility] [address] [place], [place] [address] [phone]

## 2020-05-20 NOTE — Patient Instructions (Addendum)
STOP sotalol  START on Saturday, July 17th Amiodarone 200mg  twice a day WITH food. After 1 month we will decrease to 200mg  once a day   Continue weekly INR checks - next check will be Thursday at 10am   Have EKG and INR check at primary care office on next Friday, July 23rd @ 11am

## 2020-05-21 ENCOUNTER — Other Ambulatory Visit (HOSPITAL_COMMUNITY): Payer: Self-pay | Admitting: *Deleted

## 2020-05-21 MED ORDER — POTASSIUM CHLORIDE ER 10 MEQ PO TBCR: 30.0000 meq | EXTENDED_RELEASE_TABLET | Freq: Every day | ORAL | 3 refills | Status: DC

## 2020-05-22 ENCOUNTER — Encounter: Payer: Self-pay | Admitting: Family Medicine

## 2020-05-22 ENCOUNTER — Other Ambulatory Visit: Payer: Self-pay

## 2020-05-22 ENCOUNTER — Ambulatory Visit (INDEPENDENT_AMBULATORY_CARE_PROVIDER_SITE_OTHER): Payer: Medicare HMO | Admitting: Family Medicine

## 2020-05-22 VITALS — BP 134/96 | HR 81 | Temp 97.2°F | Ht 61.0 in | Wt 127.0 lb

## 2020-05-22 DIAGNOSIS — R1011 Right upper quadrant pain: Secondary | ICD-10-CM

## 2020-05-22 DIAGNOSIS — I4891 Unspecified atrial fibrillation: Secondary | ICD-10-CM

## 2020-05-22 DIAGNOSIS — Z7901 Long term (current) use of anticoagulants: Secondary | ICD-10-CM

## 2020-05-22 LAB — COAGUCHEK XS/INR WAIVED
INR: 1.7 — ABNORMAL HIGH (ref 0.9–1.1)
Prothrombin Time: 20.4 s

## 2020-05-22 NOTE — Progress Notes (Signed)
Assessment & Plan:  1-2. Atrial fibrillation, unspecified type (HCC)/Chronic anticoagulation Description   Take an extra 1/2 tablet today, then resume taking warfarin 1.5 mg on Tuesday and 3 mg on all other days.   INR was 1.7 today (goal is 2.0 to 3.0). Next INR in 1 weeks.      - CoaguChek XS/INR Waived  3. RUQ abdominal pain - Offered Remeron QHS to stimulate appetite, but patient does not wish to do this at this time. - US Abdomen Limited RUQ; Future    Return as scheduled, for EKG and INR.  [name], MSN, APRN, FNP-C [facility]  Subjective:    Patient ID: Norma Barajas, female    DOB: [birth year], 79 y.o.   MRN: [id number]  Patient Care Team: Loman Brooklyn, FNP as PCP - General (Family Medicine) Minus Breeding, MD as PCP - Cardiology (Cardiology) [name], MD (Inactive) as Attending Physician (Gastroenterology) Melina Schools, OD as Consulting Physician (Optometry)   Chief Complaint:  Chief Complaint  Patient presents with  . Coagulation Disorder  . Anorexia    x 1 month.   . Fatigue    HPI: Norma Barajas is a 79 y.o. female presenting on 05/22/2020 for Coagulation Disorder, Anorexia (x 1 month. ), and Fatigue  Anticoagulation: Patient here for anticoagulation monitoring.  Patient underwent cardioversion last Friday, July 9th after which she briefly converted, but was back in A. fib before leaving the Endo suite.  She did follow-up with cardiology 2 days ago at which time it was decided she would start amiodarone 200 mg twice daily.  She has stopped taking sotalol and will start this on Saturday.  Per cardiology note she will need an EKG in 1 week with me which is already scheduled for next Friday.  She will also need weekly INRs. Indication: atrial fibrillation Bleeding Signs/Symptoms:  None Thromboembolic Signs/Symptoms:  None  Missed Coumadin Doses:  This week - missed Monday (3 days ago) Medication Changes:  yes -sotalol  discontinued and simvastatin decreased from 40 mg to 20 mg Dietary Changes:  no Bacterial/Viral Infection:  no  New complaints: Patient has been having ongoing issues with her stomach and eating.  She reports she has no appetite for the past month because every time she eats she feels like she is going to be sick.  She has been referred to gastroenterology but does not have an appointment until September.  States she really only eats one good meal a day which is lunch.  Not eating adequate meals is causing some fatigue.   Social history:  Relevant past medical, surgical, family and social history reviewed and updated as indicated. Interim medical history since our last visit reviewed.  Allergies and medications reviewed and updated.  DATA REVIEWED: CHART IN EPIC  ROS: Negative unless specifically indicated above in HPI.    Current Outpatient Medications:  .  acetaminophen (TYLENOL) 500 MG tablet, Take 1,000 mg by mouth every 6 (six) hours as needed for moderate pain or headache., Disp: , Rfl:  .  amiodarone (PACERONE) 200 MG tablet, Take 1 tablet by mouth twice a day for 1 month then reduce to 1 tablet daily, Disp: 60 tablet, Rfl: 0 .  amLODipine (NORVASC) 10 MG tablet, Take 0.5 tablets (5 mg total) by mouth daily. (Patient taking differently: Take 10 mg by mouth every evening. ), Disp: 90 tablet, Rfl: 3 .  Calcium Carbonate-Vitamin D (CALCIUM-VITAMIN D3 PO), Take 1 tablet by mouth daily.,  Disp: , Rfl:  .  cholecalciferol (VITAMIN D3) 25 MCG (1000 UT) tablet, Take 1,000 Units by mouth daily., Disp: , Rfl:  .  furosemide (LASIX) 40 MG tablet, Take 1 tablet (40 mg total) by mouth daily., Disp: 30 tablet, Rfl: 3 .  loratadine (CLARITIN) 10 MG tablet, Take 10 mg by mouth daily as needed for allergies., Disp: , Rfl:  .  metoprolol tartrate (LOPRESSOR) 25 MG tablet, Take 1.5 tablets (37.5 mg total) by mouth 2 (two) times daily., Disp: 180 tablet, Rfl: 3 .  Multiple Vitamin (MULTIVITAMIN)  capsule, Take 1 capsule by mouth every evening., Disp: , Rfl:  .  Omega-3 Fatty Acids (FISH OIL) 1000 MG CAPS, Take 1,000 mg by mouth 2 (two) times daily. , Disp: , Rfl:  .  omeprazole (PRILOSEC) 20 MG capsule, Take 1 capsule (20 mg total) by mouth daily., Disp: 90 capsule, Rfl: 3 .  potassium chloride (KLOR-CON) 10 MEQ tablet, Take 3 tablets (30 mEq total) by mouth daily with breakfast., Disp: 60 tablet, Rfl: 3 .  simvastatin (ZOCOR) 40 MG tablet, Take 0.5 tablets (20 mg total) by mouth daily., Disp: 90 tablet, Rfl: 2 .  warfarin (COUMADIN) 3 MG tablet, TAKE 1/2 TO 1 TABLET EVERY DAY AS DIRECTED BY  ANTICOAGULATION CLINIC (Patient taking differently: Take 1.5-3 mg by mouth See admin instructions. Take 3 mg daily at night except take 1.5 mg at night on Tuesdays), Disp: 90 tablet, Rfl: 0   Allergies  Allergen Reactions  . Codeine Nausea And Vomiting  . Doxycycline Other (See Comments)    Chest congestion  . Triamterene-Hctz Other (See Comments)    weakness  . Atorvastatin Other (See Comments)    Myalgias   . Crestor [Rosuvastatin Calcium] Other (See Comments)    weakness  . Morphine Nausea Only  . Risedronate Sodium Other (See Comments)    ACTONEL - reflux   Past Medical History:  Diagnosis Date  . 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 Anxiety   . Arthritis    "right leg" (04/11/2015)  . BPPV (benign paroxysmal positional vertigo) 03/23/2016  . Cataract   . Coronary atherosclerosis of native coronary artery    a. Nonobstructive minimal CAD 10/2005.  Marland Kitchen Depression   . Diastolic dysfunction    Grade 1. Ejection fraction 60-65%.  Marland Kitchen Dysrhythmia    a fib  . Erosive esophagitis   . Essential hypertension   . GERD (gastroesophageal reflux disease)   . Grade III hemorrhoids   . History of hiatal hernia   . Hypercholesterolemia   . Internal hemorrhoids with complication 9/37/1696   OCT 2015 FLEX SIG/IH BANDING    . Migraine    "used to have them right bad; I don't now"  (04/11/2015)  . MITRAL REGURGITATION 04/27/2010   Qualifier: Diagnosis of  By: [name], MD, [name]   . Osteoporosis   . Paroxysmal atrial fibrillation (HCC)   . PONV (postoperative nausea and vomiting)   . Vertigo     Past Surgical History:  Procedure Laterality Date  . BRAVO [place] STUDY  11/15/2012   Procedure: BRAVO [place];  Surgeon: [name], MD;  [facility];  Service: Endoscopy;;  . CARDIOVERSION N/A 05/16/2020   Procedure: CARDIOVERSION;  Surgeon: Donato Heinz, MD;  Location: [facility] ENDOSCOPY;  Service: Cardiovascular;  Laterality: N/A;  . CATARACT EXTRACTION W/ INTRAOCULAR LENS  IMPLANT, BILATERAL Bilateral   . COLONOSCOPY  2008   Dr. [name]: internal hemorrhoids   .  DILATION AND CURETTAGE OF UTERUS    . ESOPHAGOGASTRODUODENOSCOPY (EGD) WITH ESOPHAGEAL DILATION  2001   Dr. [name]: erosive esophagitis, esophageal stricture, duodenitis, s/p Savary dilation  . ESOPHAGOGASTRODUODENOSCOPY (EGD) WITH ESOPHAGEAL DILATION  11/15/2012   GGY:IRSWNIOEVO web was found & MOST LIKELY CAUSE FOR DYAPHAGIA/Polyp was found in the gastric body and gastric fundus/ gastritis on bx  . EYE SURGERY Bilateral    "laser OR after cataract OR; cause I couldn't see"  . FLEXIBLE SIGMOIDOSCOPY N/A 08/22/2014   mild diverticulosis in sigmoid, moderate sized Grade 3 hemorrhoids s/p banding X 3.   Marland Kitchen FRACTURE SURGERY Right    below the knee - 2 bones broke has plates  . HEMORRHOID BANDING N/A 08/22/2014   Procedure: HEMORRHOID BANDING;  Surgeon: [name], MD;  Location[facility];  Service: Endoscopy;  Laterality: N/A;  . HEMORRHOID SURGERY N/A 03/30/2018   Procedure: EXTENSIVE HEMORRHOIDECTOMY;  Surgeon: [name], MD;  Location: [facility] ORS;  Service: General;  Laterality: N/A;  . OPEN REDUCTION INTERNAL FIXATION (ORIF) TIBIA/FIBULA FRACTURE Right 2013   broke tibia and fibula after falling down stairs  . PROLAPSED UTERINE FIBROID LIGATION  2015  . TOTAL ABDOMINAL  HYSTERECTOMY      Social History   Socioeconomic History  . Marital status: Married    Spouse name: george   . Number of children: 1  . Years of education: Not on file  . Highest education level: Not on file  Occupational History  . Occupation: Retired    Comment: Textile  Tobacco Use  . Smoking status: Never Smoker  . Smokeless tobacco: Never Used  Vaping Use  . Vaping Use: Never used  Substance and Sexual Activity  . Alcohol use: No    Alcohol/week: 0.0 standard drinks  . Drug use: No  . Sexual activity: Not Currently    Birth control/protection: Surgical, Post-menopausal    Comment: hyst  Other Topics Concern  . Not on file  Social History Narrative   Married   No regular exercise   Social Determinants of Health   Financial Resource Strain:   . Difficulty of Paying Living Expenses:   Food Insecurity:   . Worried About Charity fundraiser in the Last Year:   . Arboriculturist in the Last Year:   Transportation Needs:   . Film/video editor (Medical):   Marland Kitchen Lack of Transportation (Non-Medical):   Physical Activity:   . Days of Exercise per Week:   . Minutes of Exercise per Session:   Stress:   . Feeling of Stress :   Social Connections:   . Frequency of Communication with Friends and Family:   . Frequency of Social Gatherings with Friends and Family:   . Attends Religious Services:   . Active Member of Clubs or Organizations:   . Attends Archivist Meetings:   Marland Kitchen Marital Status:   Intimate Partner Violence:   . Fear of Current or Ex-Partner:   . Emotionally Abused:   Marland Kitchen Physically Abused:   . Sexually Abused:         Objective:    BP (!) 134/96   Pulse 81   Temp (!) 97.2 F (36.2 C) (Temporal)   Ht 5\' 1"  (1.549 m)   Wt 127 lb (57.6 kg)   SpO2 96%   BMI 24.00 kg/m   Wt Readings from Last 3 Encounters:  05/22/20 127 lb (57.6 kg)  05/20/20 127 lb 6.4 oz (57.8 kg)  05/16/20  128 lb (58.1 kg)    Physical Exam Vitals reviewed.   Constitutional:      General: She is not in acute distress.    Appearance: Normal appearance. She is normal weight. She is not ill-appearing, toxic-appearing or diaphoretic.  HENT:     Head: Normocephalic and atraumatic.  Eyes:     General: No scleral icterus.       Right eye: No discharge.        Left eye: No discharge.     Conjunctiva/sclera: Conjunctivae normal.  Cardiovascular:     Rate and Rhythm: Normal rate. Rhythm irregular.     Heart sounds: Normal heart sounds. No murmur heard.  No friction rub. No gallop.   Pulmonary:     Effort: Pulmonary effort is normal. No respiratory distress.     Breath sounds: Normal breath sounds. No stridor. No wheezing, rhonchi or rales.  Musculoskeletal:        General: Normal range of motion.     Cervical back: Normal range of motion.  Skin:    General: Skin is warm and dry.     Capillary Refill: Capillary refill takes less than 2 seconds.  Neurological:     General: No focal deficit present.     Mental Status: She is alert and oriented to person, place, and time. Mental status is at baseline.  Psychiatric:        Mood and Affect: Mood normal.        Behavior: Behavior normal.        Thought Content: Thought content normal.        Judgment: Judgment normal.     Lab Results  Component Value Date   TSH 0.805 05/20/2020   Lab Results  Component Value Date   WBC 6.0 12/05/2019   HGB 15.7 12/05/2019   HCT 47.1 (H) 12/05/2019   MCV 95 12/05/2019   PLT 181 12/05/2019   Lab Results  Component Value Date   NA 140 05/20/2020   K 3.3 (L) 05/20/2020   CO2 26 05/20/2020   GLUCOSE 117 (H) 05/20/2020   BUN 9 05/20/2020   CREATININE 1.00 05/20/2020   BILITOT 0.6 05/20/2020   ALKPHOS 62 05/20/2020   AST 28 05/20/2020   ALT 18 05/20/2020   PROT 6.7 05/20/2020   ALBUMIN 3.8 05/20/2020   CALCIUM 8.8 (L) 05/20/2020   ANIONGAP 15 05/20/2020   Lab Results  Component Value Date   CHOL 186 12/05/2019   Lab Results  Component Value  Date   HDL 48 12/05/2019   Lab Results  Component Value Date   LDLCALC 98 12/05/2019   Lab Results  Component Value Date   TRIG 235 (H) 12/05/2019   Lab Results  Component Value Date   CHOLHDL 3.9 12/05/2019   Lab Results  Component Value Date   HGBA1C 5.6 02/28/2017

## 2020-05-27 ENCOUNTER — Ambulatory Visit (HOSPITAL_COMMUNITY): Payer: Medicare HMO | Admitting: Nurse Practitioner

## 2020-05-28 ENCOUNTER — Other Ambulatory Visit: Payer: Self-pay

## 2020-05-28 ENCOUNTER — Ambulatory Visit (HOSPITAL_COMMUNITY)
Admission: RE | Admit: 2020-05-28 | Discharge: 2020-05-28 | Disposition: A | Discharge status: Home / Self Care | Payer: Medicare HMO | Source: Ambulatory Visit | Attending: Family Medicine | Admitting: Family Medicine

## 2020-05-28 DIAGNOSIS — R1011 Right upper quadrant pain: Secondary | ICD-10-CM | POA: Diagnosis not present

## 2020-05-28 DIAGNOSIS — K7689 Other specified diseases of liver: Secondary | ICD-10-CM | POA: Diagnosis not present

## 2020-05-30 ENCOUNTER — Encounter: Payer: Self-pay | Admitting: Family Medicine

## 2020-05-30 ENCOUNTER — Ambulatory Visit (INDEPENDENT_AMBULATORY_CARE_PROVIDER_SITE_OTHER): Payer: Medicare HMO | Admitting: Family Medicine

## 2020-05-30 ENCOUNTER — Other Ambulatory Visit: Payer: Self-pay

## 2020-05-30 VITALS — BP 112/81 | HR 83 | Temp 97.8°F | Ht 61.0 in | Wt 125.8 lb

## 2020-05-30 DIAGNOSIS — I4891 Unspecified atrial fibrillation: Secondary | ICD-10-CM

## 2020-05-30 DIAGNOSIS — Z7901 Long term (current) use of anticoagulants: Secondary | ICD-10-CM | POA: Diagnosis not present

## 2020-05-30 DIAGNOSIS — R630 Anorexia: Secondary | ICD-10-CM | POA: Diagnosis not present

## 2020-05-30 LAB — COAGUCHEK XS/INR WAIVED
INR: 2.1 — ABNORMAL HIGH (ref 0.9–1.1)
Prothrombin Time: 24.6 s

## 2020-05-30 MED ORDER — MIRTAZAPINE 15 MG PO TABS: 15.0000 mg | ORAL_TABLET | Freq: Every day | ORAL | 2 refills | Status: DC

## 2020-05-30 NOTE — Progress Notes (Addendum)
Assessment & Plan:  1-2. Atrial fibrillation, unspecified type (HCC)/Chronic anticoagulation Description   Continue warfarin 1.5 mg on Tuesday and 3 mg on all other days.   INR was 2.1 today (goal is 2.0 to 3.0). Next INR in 1 weeks.      - CoaguChek XS/INR Waived - EKG 12-Lead  3. Decrease in appetite - Starting Remeron today. Patient wants to try this before a CT scan of the abdomen.  - mirtazapine (REMERON) 15 MG tablet; Take 1 tablet (15 mg total) by mouth at bedtime.  Dispense: 30 tablet; Refill: 2   Return in about 1 week (around 06/06/2020) for INR.  [name], MSN, APRN, FNP-C [facility]  Subjective:    Patient ID: Norma Barajas, female    DOB: November 03, 1941, 79 y.o.   MRN: [id number]  Patient Care Team: Loman Brooklyn, FNP as PCP - General (Family Medicine) Minus Breeding, MD as PCP - Cardiology (Cardiology) [name], MD (Inactive) as Attending Physician (Gastroenterology) Melina Schools, OD as Consulting Physician (Optometry)   Chief Complaint:  Chief Complaint  Patient presents with  . Coagulation Disorder  . Abdominal Pain    Patient states she has been having abd pain x 1 month.    HPI: Norma Barajas is a 79 y.o. female presenting on 05/30/2020 for Coagulation Disorder and Abdominal Pain (Patient states she has been having abd pain x 1 month.)  Anticoagulation: Patient here for anticoagulation monitoring. Indication: atrial fibrillation Bleeding Signs/Symptoms:  None Thromboembolic Signs/Symptoms:  None  Missed Coumadin Doses:  None Medication Changes:  no Dietary Changes:  no Bacterial/Viral Infection:  no   New complaints: Patient requesting I look in her left ear because it sometimes feels full.  She is also concerned that her stomach hurts after eating late last night. She continues with a decreased appetite. She had an [place] of her RUQ and is now agreeable to Remeron. She mentioned obtaining a CT scan of the abdomen,  but wants to try the Remeron first.   Social history:  Relevant past medical, surgical, family and social history reviewed and updated as indicated. Interim medical history since our last visit reviewed.  Allergies and medications reviewed and updated.  DATA REVIEWED: CHART IN EPIC  ROS: Negative unless specifically indicated above in HPI.    Current Outpatient Medications:  .  acetaminophen (TYLENOL) 500 MG tablet, Take 1,000 mg by mouth every 6 (six) hours as needed for moderate pain or headache., Disp: , Rfl:  .  amiodarone (PACERONE) 200 MG tablet, Take 1 tablet by mouth twice a day for 1 month then reduce to 1 tablet daily, Disp: 60 tablet, Rfl: 0 .  amLODipine (NORVASC) 10 MG tablet, Take 0.5 tablets (5 mg total) by mouth daily. (Patient taking differently: Take 10 mg by mouth every evening. ), Disp: 90 tablet, Rfl: 3 .  Calcium Carbonate-Vitamin D (CALCIUM-VITAMIN D3 PO), Take 1 tablet by mouth daily., Disp: , Rfl:  .  cholecalciferol (VITAMIN D3) 25 MCG (1000 UT) tablet, Take 1,000 Units by mouth daily., Disp: , Rfl:  .  furosemide (LASIX) 40 MG tablet, Take 1 tablet (40 mg total) by mouth daily., Disp: 30 tablet, Rfl: 3 .  loratadine (CLARITIN) 10 MG tablet, Take 10 mg by mouth daily as needed for allergies., Disp: , Rfl:  .  metoprolol tartrate (LOPRESSOR) 25 MG tablet, Take 1.5 tablets (37.5 mg total) by mouth 2 (two) times daily., Disp: 180 tablet, Rfl: 3 .  Multiple Vitamin (MULTIVITAMIN) capsule, Take 1 capsule by mouth every evening., Disp: , Rfl:  .  Omega-3 Fatty Acids (FISH OIL) 1000 MG CAPS, Take 1,000 mg by mouth 2 (two) times daily. , Disp: , Rfl:  .  omeprazole (PRILOSEC) 20 MG capsule, Take 1 capsule (20 mg total) by mouth daily., Disp: 90 capsule, Rfl: 3 .  potassium chloride (KLOR-CON) 10 MEQ tablet, Take 3 tablets (30 mEq total) by mouth daily with breakfast., Disp: 60 tablet, Rfl: 3 .  simvastatin (ZOCOR) 40 MG tablet, Take 0.5 tablets (20 mg total) by mouth  daily., Disp: 90 tablet, Rfl: 2 .  warfarin (COUMADIN) 3 MG tablet, TAKE 1/2 TO 1 TABLET EVERY DAY AS DIRECTED BY  ANTICOAGULATION CLINIC (Patient taking differently: Take 1.5-3 mg by mouth See admin instructions. Take 3 mg daily at night except take 1.5 mg at night on Tuesdays), Disp: 90 tablet, Rfl: 0   Allergies  Allergen Reactions  . Codeine Nausea And Vomiting  . Doxycycline Other (See Comments)    Chest congestion  . Triamterene-Hctz Other (See Comments)    weakness  . Atorvastatin Other (See Comments)    Myalgias   . Crestor [Rosuvastatin Calcium] Other (See Comments)    weakness  . Morphine Nausea Only  . Risedronate Sodium Other (See Comments)    ACTONEL - reflux   Past Medical History:  Diagnosis Date  . 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 Anxiety   . Arthritis    "right leg" (04/11/2015)  . BPPV (benign paroxysmal positional vertigo) 03/23/2016  . Cataract   . Coronary atherosclerosis of native coronary artery    a. Nonobstructive minimal CAD 10/2005.  Marland Kitchen Depression   . Diastolic dysfunction    Grade 1. Ejection fraction 60-65%.  Marland Kitchen Dysrhythmia    a fib  . Erosive esophagitis   . Essential hypertension   . GERD (gastroesophageal reflux disease)   . Grade III hemorrhoids   . History of hiatal hernia   . Hypercholesterolemia   . Internal hemorrhoids with complication 0/86/7619   OCT 2015 FLEX SIG/IH BANDING    . Migraine    "used to have them right bad; I don't now" (04/11/2015)  . MITRAL REGURGITATION 04/27/2010   Qualifier: Diagnosis of  By: [name], MD, [name]   . Osteoporosis   . Paroxysmal atrial fibrillation (HCC)   . PONV (postoperative nausea and vomiting)   . Vertigo     Past Surgical History:  Procedure Laterality Date  . BRAVO [place] STUDY  11/15/2012   Procedure: BRAVO [place];  Surgeon: [name], MD;  [facility];  Service: Endoscopy;;  . CARDIOVERSION N/A 05/16/2020   Procedure: CARDIOVERSION;  Surgeon:  Donato Heinz, MD;  Location: [facility] END[facility]COPY;  Service: Cardiovascular;  Laterality: N/A;  . CATARACT EXTRACTION W/ INTRAOCULAR LENS  IMPLANT, BILATERAL Bilateral   . COLONOSCOPY  2008   Dr. [name]: internal hemorrhoids   . DILATION AND CURETTAGE OF UTERUS    . ESOPHAGOGASTRODUODENOSCOPY (EGD) WITH ESOPHAGEAL DILATION  2001   Dr. [name]: erosive esophagitis, esophageal stricture, duodenitis, s/p Savary dilation  . ESOPHAGOGASTRODUODENOSCOPY (EGD) WITH ESOPHAGEAL DILATION  11/15/2012   [id number] web was found & MOST LIKELY CAUSE FOR DYAPHAGIA/Polyp was found in the gastric body and gastric fundus/ gastritis on bx  . EYE SURGERY Bilateral    "laser OR after cataract OR; cause I couldn't see"  . FLEXIBLE SIGMOIDOSCOPY N/A 08/22/2014   mild diverticulosis in sigmoid,  moderate sized Grade 3 hemorrhoids s/p banding X 3.   Marland Kitchen FRACTURE SURGERY Right    below the knee - 2 bones broke has plates  . HEMORRHOID BANDING N/A 08/22/2014   Procedure: HEMORRHOID BANDING;  Surgeon: [name], MD;  [facility];  Service: Endoscopy;  Laterality: N/A;  . HEMORRHOID SURGERY N/A 03/30/2018   Procedure: EXTENSIVE HEMORRHOIDECTOMY;  Surgeon: [name], MD;  Location: [facility] ORS;  Service: General;  Laterality: N/A;  . OPEN REDUCTION INTERNAL FIXATION (ORIF) TIBIA/FIBULA FRACTURE Right 2013   broke tibia and fibula after falling down stairs  . PROLAPSED UTERINE FIBROID LIGATION  2015  . TOTAL ABDOMINAL HYSTERECTOMY      Social History   Socioeconomic History  . Marital status: Married    Spouse name: george   . Number of children: 1  . Years of education: Not on file  . Highest education level: Not on file  Occupational History  . Occupation: Retired    Comment: Textile  Tobacco Use  . Smoking status: Never Smoker  . Smokeless tobacco: Never Used  Vaping Use  . Vaping Use: Never used  Substance and Sexual Activity  . Alcohol use: No    Alcohol/week: 0.0 standard drinks   . Drug use: No  . Sexual activity: Not Currently    Birth control/protection: Surgical, Post-menopausal    Comment: hyst  Other Topics Concern  . Not on file  Social History Narrative   Married   No regular exercise   Social Determinants of Health   Financial Resource Strain:   . Difficulty of Paying Living Expenses:   Food Insecurity:   . Worried About Charity fundraiser in the Last Year:   . Arboriculturist in the Last Year:   Transportation Needs:   . Film/video editor (Medical):   Marland Kitchen Lack of Transportation (Non-Medical):   Physical Activity:   . Days of Exercise per Week:   . Minutes of Exercise per Session:   Stress:   . Feeling of Stress :   Social Connections:   . Frequency of Communication with Friends and Family:   . Frequency of Social Gatherings with Friends and Family:   . Attends Religious Services:   . Active Member of Clubs or Organizations:   . Attends Archivist Meetings:   Marland Kitchen Marital Status:   Intimate Partner Violence:   . Fear of Current or Ex-Partner:   . Emotionally Abused:   Marland Kitchen Physically Abused:   . Sexually Abused:         Objective:    BP 112/81   Pulse 83   Temp 97.8 F (36.6 C) (Temporal)   Ht 5\' 1"  (1.549 m)   Wt 125 lb 12.8 oz (57.1 kg)   SpO2 94%   BMI 23.77 kg/m   Wt Readings from Last 3 Encounters:  05/30/20 125 lb 12.8 oz (57.1 kg)  05/22/20 127 lb (57.6 kg)  05/20/20 127 lb 6.4 oz (57.8 kg)    Physical Exam Vitals reviewed.  Constitutional:      General: She is not in acute distress.    Appearance: Normal appearance. She is not ill-appearing, toxic-appearing or diaphoretic.  HENT:     Head: Normocephalic and atraumatic.     Right Ear: Tympanic membrane, ear canal and external ear normal. There is no impacted cerumen.     Left Ear: Tympanic membrane, ear canal and external ear normal. There is no impacted cerumen.  Eyes:  General: No scleral icterus.       Right eye: No discharge.        Left  eye: No discharge.     Conjunctiva/sclera: Conjunctivae normal.  Cardiovascular:     Rate and Rhythm: Normal rate. Rhythm irregular.     Heart sounds: Normal heart sounds. No murmur heard.  No friction rub. No gallop.   Pulmonary:     Effort: Pulmonary effort is normal. No respiratory distress.     Breath sounds: Normal breath sounds. No stridor. No wheezing, rhonchi or rales.  Musculoskeletal:        General: Normal range of motion.     Cervical back: Normal range of motion.  Skin:    General: Skin is warm and dry.     Capillary Refill: Capillary refill takes less than 2 seconds.  Neurological:     General: No focal deficit present.     Mental Status: She is alert and oriented to person, place, and time. Mental status is at baseline.  Psychiatric:        Mood and Affect: Mood normal.        Behavior: Behavior normal.        Thought Content: Thought content normal.        Judgment: Judgment normal.     Lab Results  Component Value Date   TSH 0.805 05/20/2020   Lab Results  Component Value Date   WBC 6.0 12/05/2019   HGB 15.7 12/05/2019   HCT 47.1 (H) 12/05/2019   MCV 95 12/05/2019   PLT 181 12/05/2019   Lab Results  Component Value Date   NA 140 05/20/2020   K 3.3 (L) 05/20/2020   CO2 26 05/20/2020   GLUCOSE 117 (H) 05/20/2020   BUN 9 05/20/2020   CREATININE 1.00 05/20/2020   BILITOT 0.6 05/20/2020   ALKPHOS 62 05/20/2020   AST 28 05/20/2020   ALT 18 05/20/2020   PROT 6.7 05/20/2020   ALBUMIN 3.8 05/20/2020   CALCIUM 8.8 (L) 05/20/2020   ANIONGAP 15 05/20/2020   Lab Results  Component Value Date   CHOL 186 12/05/2019   Lab Results  Component Value Date   HDL 48 12/05/2019   Lab Results  Component Value Date   LDLCALC 98 12/05/2019   Lab Results  Component Value Date   TRIG 235 (H) 12/05/2019   Lab Results  Component Value Date   CHOLHDL 3.9 12/05/2019   Lab Results  Component Value Date   HGBA1C 5.6 02/28/2017

## 2020-06-01 ENCOUNTER — Encounter: Payer: Self-pay | Admitting: Family Medicine

## 2020-06-03 ENCOUNTER — Other Ambulatory Visit (HOSPITAL_COMMUNITY): Payer: Self-pay | Admitting: *Deleted

## 2020-06-05 ENCOUNTER — Other Ambulatory Visit: Payer: Self-pay

## 2020-06-05 ENCOUNTER — Other Ambulatory Visit (HOSPITAL_COMMUNITY): Payer: Self-pay | Admitting: *Deleted

## 2020-06-05 ENCOUNTER — Telehealth: Payer: Self-pay | Admitting: Family Medicine

## 2020-06-05 ENCOUNTER — Encounter (HOSPITAL_COMMUNITY): Payer: Self-pay | Admitting: Nurse Practitioner

## 2020-06-05 ENCOUNTER — Ambulatory Visit (HOSPITAL_COMMUNITY)
Admission: RE | Admit: 2020-06-05 | Discharge: 2020-06-05 | Disposition: A | Discharge status: Home / Self Care | Payer: Medicare HMO | Source: Ambulatory Visit | Attending: Nurse Practitioner | Admitting: Nurse Practitioner

## 2020-06-05 VITALS — BP 120/74 | HR 82 | Ht 61.0 in | Wt 123.6 lb

## 2020-06-05 DIAGNOSIS — K2100 Gastro-esophageal reflux disease with esophagitis, without bleeding: Secondary | ICD-10-CM | POA: Insufficient documentation

## 2020-06-05 DIAGNOSIS — H269 Unspecified cataract: Secondary | ICD-10-CM | POA: Diagnosis not present

## 2020-06-05 DIAGNOSIS — M1990 Unspecified osteoarthritis, unspecified site: Secondary | ICD-10-CM | POA: Insufficient documentation

## 2020-06-05 DIAGNOSIS — H8110 Benign paroxysmal vertigo, unspecified ear: Secondary | ICD-10-CM | POA: Diagnosis not present

## 2020-06-05 DIAGNOSIS — E785 Hyperlipidemia, unspecified: Secondary | ICD-10-CM | POA: Diagnosis not present

## 2020-06-05 DIAGNOSIS — Z8719 Personal history of other diseases of the digestive system: Secondary | ICD-10-CM | POA: Insufficient documentation

## 2020-06-05 DIAGNOSIS — D6869 Other thrombophilia: Secondary | ICD-10-CM

## 2020-06-05 DIAGNOSIS — I340 Nonrheumatic mitral (valve) insufficiency: Secondary | ICD-10-CM | POA: Insufficient documentation

## 2020-06-05 DIAGNOSIS — Z881 Allergy status to other antibiotic agents status: Secondary | ICD-10-CM | POA: Insufficient documentation

## 2020-06-05 DIAGNOSIS — Z888 Allergy status to other drugs, medicaments and biological substances status: Secondary | ICD-10-CM | POA: Diagnosis not present

## 2020-06-05 DIAGNOSIS — I480 Paroxysmal atrial fibrillation: Secondary | ICD-10-CM | POA: Diagnosis not present

## 2020-06-05 DIAGNOSIS — Z8262 Family history of osteoporosis: Secondary | ICD-10-CM | POA: Diagnosis not present

## 2020-06-05 DIAGNOSIS — Z8249 Family history of ischemic heart disease and other diseases of the circulatory system: Secondary | ICD-10-CM | POA: Insufficient documentation

## 2020-06-05 DIAGNOSIS — Z885 Allergy status to narcotic agent status: Secondary | ICD-10-CM | POA: Diagnosis not present

## 2020-06-05 DIAGNOSIS — I10 Essential (primary) hypertension: Secondary | ICD-10-CM | POA: Diagnosis not present

## 2020-06-05 DIAGNOSIS — I4819 Other persistent atrial fibrillation: Secondary | ICD-10-CM | POA: Diagnosis not present

## 2020-06-05 DIAGNOSIS — Z8349 Family history of other endocrine, nutritional and metabolic diseases: Secondary | ICD-10-CM | POA: Diagnosis not present

## 2020-06-05 DIAGNOSIS — Z79899 Other long term (current) drug therapy: Secondary | ICD-10-CM | POA: Diagnosis not present

## 2020-06-05 DIAGNOSIS — F419 Anxiety disorder, unspecified: Secondary | ICD-10-CM | POA: Insufficient documentation

## 2020-06-05 DIAGNOSIS — Z7901 Long term (current) use of anticoagulants: Secondary | ICD-10-CM | POA: Diagnosis not present

## 2020-06-05 DIAGNOSIS — I2510 Atherosclerotic heart disease of native coronary artery without angina pectoris: Secondary | ICD-10-CM | POA: Diagnosis not present

## 2020-06-05 DIAGNOSIS — E7800 Pure hypercholesterolemia, unspecified: Secondary | ICD-10-CM | POA: Insufficient documentation

## 2020-06-05 DIAGNOSIS — F329 Major depressive disorder, single episode, unspecified: Secondary | ICD-10-CM | POA: Diagnosis not present

## 2020-06-05 DIAGNOSIS — R600 Localized edema: Secondary | ICD-10-CM | POA: Insufficient documentation

## 2020-06-05 MED ORDER — AMIODARONE HCL 200 MG PO TABS: 200.0000 mg | ORAL_TABLET | Freq: Every day | ORAL | 1 refills | Status: DC

## 2020-06-05 MED ORDER — AMLODIPINE BESYLATE 10 MG PO TABS: 10.0000 mg | ORAL_TABLET | Freq: Every evening | ORAL | Status: DC

## 2020-06-05 NOTE — Progress Notes (Addendum)
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 This correlates with the development of an URI, bronchitis that the pt reports was bordering  on pneumonia. She  was prescribed doxycycline but she stopped after a few days because she felt it was making her worse. Despite stopping antibiotic, she feels that she is improving form the URI. She feels shortness of breath, fatigue and feels like she is retaining fluid in her legs and abdomen.  She is in afib with RVR. She is on warfarin with a CHA2DS2VASc score of 4.   F/u in afib clinic, 7/13, after 4 weeks of therapeutic INR's, the pt proceeded to DCCV. She briefly converted but pt was back in afib before leaving the endo suite. She feels poorly in afib and would like to get back in SR. She has now failed  sotalol ,  tried Tikosyn in the past with the QT prolonging and too costly. She  used amiodarone remotely, stopped because she felt jittery, but this is the only antiarrythmic I have let to try,  so I feel it is worth a re challenge  of amiodarone. Pt is in agreement. She is in afib today at 129 bpm.   F/u in afib clinic, 06/05/20. She has now been loading on amiodarone for 2 weeks. She is rate controlled and feels improved. She had a low INR  2 weeks ago but was in range last week. Stressed importance of having weekly  INR's with pending cardioversion after amiodarone loading for 2 more weeks.   Today, she denies symptoms of palpitations, chest pain, shortness of breath, orthopnea, PND, lower extremity edema, dizziness, presyncope, syncope, or neurologic sequela. The patient is tolerating medications without difficulties and is otherwise without complaint today.   Past Medical History:  Diagnosis Date  . 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 Anxiety   .  Arthritis    "right leg" (04/11/2015)  . BPPV (benign paroxysmal positional vertigo) 03/23/2016  . Cataract   . Coronary atherosclerosis of native coronary artery    a. Nonobstructive minimal CAD 10/2005.  Marland Kitchen Depression   . Diastolic dysfunction    Grade 1. Ejection fraction 60-65%.  Marland Kitchen Dysrhythmia    a fib  . Erosive esophagitis   . Essential hypertension   . GERD (gastroesophageal reflux disease)   . Grade III hemorrhoids   . History of hiatal hernia   . Hypercholesterolemia   . Internal hemorrhoids with complication 5/78/4696   OCT 2015 FLEX SIG/IH BANDING    . Migraine    "used to have them right bad; I don't now" (04/11/2015)  . MITRAL REGURGITATION 04/27/2010   Qualifier: Diagnosis of  By: [name], MD, [name]   . Osteoporosis   . Paroxysmal atrial fibrillation (HCC)   . PONV (postoperative nausea and vomiting)   . Vertigo    Past Surgical History:  Procedure Laterality Date  . BRAVO [place] STUDY  11/15/2012   Procedure: BRAVO [place];  Surgeon: [name], MD;  [facility];  Service: Endoscopy;;  . CARDIOVERSION N/A 05/16/2020   Procedure: CARDIOVERSION;  Surgeon: Donato Heinz, MD;  Location: [facility];  Service: Cardiovascular;  Laterality: N/A;  . CATARACT EXTRACTION W/ INTRAOCULAR LENS  IMPLANT, BILATERAL Bilateral   .  COLONOSCOPY  2008   Dr. [name]: internal hemorrhoids   . DILATION AND CURETTAGE OF UTERUS    . ESOPHAGOGASTRODUODENOSCOPY (EGD) WITH ESOPHAGEAL DILATION  2001   Dr. [name]: erosive esophagitis, esophageal stricture, duodenitis, s/p Savary dilation  . ESOPHAGOGASTRODUODENOSCOPY (EGD) WITH ESOPHAGEAL DILATION  11/15/2012   [id number] web was found & MOST LIKELY CAUSE FOR DYAPHAGIA/Polyp was found in the gastric body and gastric fundus/ gastritis on bx  . EYE SURGERY Bilateral    "laser OR after cataract OR; cause I couldn't see"  . FLEXIBLE SIGMOIDOSCOPY N/A 08/22/2014   mild diverticulosis in sigmoid, moderate sized  Grade 3 hemorrhoids s/p banding X 3.   Marland Kitchen FRACTURE SURGERY Right    below the knee - 2 bones broke has plates  . HEMORRHOID BANDING N/A 08/22/2014   Procedure: HEMORRHOID BANDING;  Surgeon: [name], MD;  [facility];  Service: Endoscopy;  Laterality: N/A;  . HEMORRHOID SURGERY N/A 03/30/2018   Procedure: EXTENSIVE HEMORRHOIDECTOMY;  Surgeon: [name], MD;  Location: [facility];  Service: General;  Laterality: N/A;  . OPEN REDUCTION INTERNAL FIXATION (ORIF) TIBIA/FIBULA FRACTURE Right 2013   broke tibia and fibula after falling down stairs  . PROLAPSED UTERINE FIBROID LIGATION  2015  . TOTAL ABDOMINAL HYSTERECTOMY      Current Outpatient Medications  Medication Sig Dispense Refill  . acetaminophen (TYLENOL) 500 MG tablet Take 1,000 mg by mouth every 6 (six) hours as needed for moderate pain or headache.    Marland Kitchen amiodarone (PACERONE) 200 MG tablet Take 1 tablet by mouth twice a day for 1 month then reduce to 1 tablet daily 60 tablet 0  . amLODipine (NORVASC) 10 MG tablet Take 1 tablet (10 mg total) by mouth every evening.    . Calcium Carbonate-Vitamin D (CALCIUM-VITAMIN D3 PO) Take 1 tablet by mouth daily.    . cholecalciferol (VITAMIN D3) 25 MCG (1000 UT) tablet Take 1,000 Units by mouth daily.    . furosemide (LASIX) 40 MG tablet Take 1 tablet (40 mg total) by mouth daily. 30 tablet 3  . loratadine (CLARITIN) 10 MG tablet Take 10 mg by mouth daily as needed for allergies.    . metoprolol tartrate (LOPRESSOR) 25 MG tablet Take 1.5 tablets (37.5 mg total) by mouth 2 (two) times daily. 180 tablet 3  . mirtazapine (REMERON) 15 MG tablet Take 1 tablet (15 mg total) by mouth at bedtime. 30 tablet 2  . Multiple Vitamin (MULTIVITAMIN) capsule Take 1 capsule by mouth every evening.    . Omega-3 Fatty Acids (FISH OIL) 1000 MG CAPS Take 1,000 mg by mouth 2 (two) times daily.     Marland Kitchen omeprazole (PRILOSEC) 20 MG capsule Take 1 capsule (20 mg total) by mouth daily. 90 capsule 3  .  potassium chloride (KLOR-CON) 10 MEQ tablet Take 3 tablets (30 mEq total) by mouth daily with breakfast. 60 tablet 3  . simvastatin (ZOCOR) 20 MG tablet Take 20 mg by mouth daily.    Marland Kitchen warfarin (COUMADIN) 3 MG tablet TAKE 1/2 TO 1 TABLET EVERY DAY AS DIRECTED BY  ANTICOAGULATION CLINIC (Patient taking differently: Take 1.5-3 mg by mouth See admin instructions. Take 3 mg daily at night except take 1.5 mg at night on Tuesdays) 90 tablet 0   No current facility-administered medications for this encounter.    Allergies  Allergen Reactions  . Codeine Nausea And Vomiting  . Doxycycline Other (See Comments)    Chest congestion  . Triamterene-Hctz Other (See Comments)  weakness  . Atorvastatin Other (See Comments)    Myalgias   . Crestor [Rosuvastatin Calcium] Other (See Comments)    weakness  . Morphine Nausea Only  . Risedronate Sodium Other (See Comments)    ACTONEL - reflux    Social History   Socioeconomic History  . Marital status: Married    Spouse name: george   . Number of children: 1  . Years of education: Not on file  . Highest education level: Not on file  Occupational History  . Occupation: Retired    Comment: Textile  Tobacco Use  . Smoking status: Never Smoker  . Smokeless tobacco: Never Used  Vaping Use  . Vaping Use: Never used  Substance and Sexual Activity  . Alcohol use: No    Alcohol/week: 0.0 standard drinks  . Drug use: No  . Sexual activity: Not Currently    Birth control/protection: Surgical, Post-menopausal    Comment: hyst  Other Topics Concern  . Not on file  Social History Narrative   Married   No regular exercise   Social Determinants of Health   Financial Resource Strain:   . Difficulty of Paying Living Expenses:   Food Insecurity:   . Worried About Charity fundraiser in the Last Year:   . Arboriculturist in the Last Year:   Transportation Needs:   . Film/video editor (Medical):   Marland Kitchen Lack of Transportation (Non-Medical):     Physical Activity:   . Days of Exercise per Week:   . Minutes of Exercise per Session:   Stress:   . Feeling of Stress :   Social Connections:   . Frequency of Communication with Friends and Family:   . Frequency of Social Gatherings with Friends and Family:   . Attends Religious Services:   . Active Member of Clubs or Organizations:   . Attends Archivist Meetings:   Marland Kitchen Marital Status:   Intimate Partner Violence:   . Fear of Current or Ex-Partner:   . Emotionally Abused:   Marland Kitchen Physically Abused:   . Sexually Abused:     Family History  Problem Relation Age of Onset  . Colon cancer Mother 51  . Heart disease Mother   . Osteoporosis Mother   . Hip fracture Mother   . Stroke Sister   . Diabetes Sister   . Osteoporosis Sister   . Arthritis Sister   . Uterine cancer Sister   . Stroke Sister   . Heart disease Father   . Hyperlipidemia Brother   . Hypertension Brother   . Heart disease Brother   . Stroke Brother   . Heart disease Sister   . Dementia Sister   . Diabetes Son   . Stroke Son   . Heart attack Neg Hx     ROS- All systems are reviewed and negative except as per the HPI above  Physical Exam: Vitals:   06/05/20 1021  BP: 120/74  Pulse: 82  Weight: 56.1 kg  Height: 5\' 1"  (1.549 m)   Wt Readings from Last 3 Encounters:  06/05/20 56.1 kg  05/30/20 57.1 kg  05/22/20 57.6 kg    Labs: Lab Results  Component Value Date   NA 140 05/20/2020   K 3.3 (L) 05/20/2020   CL 99 05/20/2020   CO2 26 05/20/2020   GLUCOSE 117 (H) 05/20/2020   BUN 9 05/20/2020   CREATININE 1.00 05/20/2020   CALCIUM 8.8 (L) 05/20/2020   MG 2.3 02/13/2016  Lab Results  Component Value Date   INR 2.1 (H) 05/30/2020   Lab Results  Component Value Date   CHOL 186 12/05/2019   HDL 48 12/05/2019   LDLCALC 98 12/05/2019   TRIG 235 (H) 12/05/2019     GEN- The patient is well appearing, alert and oriented x 3 today.   Head- normocephalic, atraumatic Eyes-  Sclera  clear, conjunctiva pink Ears- hearing intact Oropharynx- clear Neck- supple, no JVP Lymph- no cervical lymphadenopathy Lungs- Clear to ausculation bilaterally, normal work of breathing Heart- irregular rate and rhythm, no murmurs, rubs or gallops, PMI not laterally displaced GI- soft, NT, ND, + BS Extremities- no clubbing, cyanosis, or 1t edema bilaterally MS- no significant deformity or atrophy Skin- no rash or lesion Psych- euthymic mood, full affect Neuro- strength and sensation are intact  EKG-afib at 82 bpm,, qrs int 72  ms, qtc 404 ms    Assessment and Plan: 1. Persistent afib with RVR Briefly successful DCCV for just a few minutes, ERAF Sotalol has failed  to keep pt in rhythm  She feels poorly in  uncontrolled afib  Feels better on amiodarone 200 mg bid  Will plan of cardioversion in another 2 weeks  Continue amiodarone 200 mg bid  Continue metoprolol to 25 mg 1 1/2 tab bid   2. HTN Stable   3. LLE Continue 20 mg lasix  daily and 30  meq Kt daily   4. CHA2DS2VASc score of 4 She is on warfarin followed in [place] at [facility]  She will continue weekly INR's  After 4 weeks of therapeutic INR's, we will contact re setting up cardioversion  5. Hyperlipidemia Reduced simvastatin to 20 mg daily, from 40 mg , with the addition of amiodarone    Addendum: 7/29 at 4:30 pm- pt called the office and  stated that she feels so much better rate controlled on amiodarone. She and her husband talked after the visit and she does not want to have to keep coming to [facility] and she does not want to purse cardioversion. She wants  to stay in rate controlled afib with amiodarone as she feels much improved. I will get her to f/u with Dr. Percival Spanish in [place] in one month. She will reduce amiodarone to 200 mg  one tablet a day and will not require weekly INR's.    Geroge Baseman Kerstyn Coryell, [facility] [address] [place], [place]  [id number] [phone]

## 2020-06-05 NOTE — Addendum Note (Signed)
Encounter addended by: Sherran Needs, NP on: 06/05/2020 4:36 PM  Actions taken: Clinical Note Signed

## 2020-06-05 NOTE — Telephone Encounter (Signed)
Spoke with patient and explained we do not have any appointments available until next Tuesday, 06/10/2020.  Scheduled patient at 8:35 am with [name].  Advised patient to call back first thing in the morning to see if we have had any cancellations for tomorrow.

## 2020-06-10 ENCOUNTER — Ambulatory Visit: Payer: Medicare HMO | Admitting: Family Medicine

## 2020-06-12 ENCOUNTER — Telehealth (HOSPITAL_COMMUNITY): Payer: Self-pay | Admitting: *Deleted

## 2020-06-12 NOTE — Telephone Encounter (Signed)
Patient called in stating she is having increased swelling over the last week wondering if related to amiodarone. Pt weight is up 3lbs from office visit last week. Pt decided to not take lasix today bc she didn't feel like it was working. Discussed afib and fluid retention - as pt had issues with this prior to going on amiodarone as well. She will take lasix now and call tomorrow with update of HRs and weight to ensure appropriate response to lasix. Pt verbalized agreement.

## 2020-06-13 MED ORDER — FUROSEMIDE 40 MG PO TABS: 40.0000 mg | ORAL_TABLET | Freq: Every day | ORAL | 2 refills | Status: DC

## 2020-06-13 MED ORDER — AMIODARONE HCL 200 MG PO TABS: 200.0000 mg | ORAL_TABLET | Freq: Every day | ORAL | 1 refills | Status: DC

## 2020-06-13 MED ORDER — SIMVASTATIN 20 MG PO TABS: 20.0000 mg | ORAL_TABLET | Freq: Every day | ORAL | 1 refills | Status: DC

## 2020-06-13 NOTE — Telephone Encounter (Signed)
Pt called with update of HR/BP and weight -- this morning her weight has returned to 123lbs BP  126/83 HR 80 feels well. Does endorse swelling in lower extremities still - encouraged elevating legs, avoiding salt and daily weights. Let office know if weights increase. Pt in agreement.

## 2020-06-27 ENCOUNTER — Encounter: Payer: Self-pay | Admitting: Family Medicine

## 2020-06-27 ENCOUNTER — Ambulatory Visit (INDEPENDENT_AMBULATORY_CARE_PROVIDER_SITE_OTHER): Payer: Medicare HMO | Admitting: Family Medicine

## 2020-06-27 ENCOUNTER — Other Ambulatory Visit: Payer: Self-pay

## 2020-06-27 VITALS — BP 115/78 | HR 82 | Temp 97.2°F | Ht 61.0 in | Wt 122.0 lb

## 2020-06-27 DIAGNOSIS — R1011 Right upper quadrant pain: Secondary | ICD-10-CM

## 2020-06-27 DIAGNOSIS — R630 Anorexia: Secondary | ICD-10-CM | POA: Diagnosis not present

## 2020-06-27 DIAGNOSIS — Z7901 Long term (current) use of anticoagulants: Secondary | ICD-10-CM

## 2020-06-27 DIAGNOSIS — I4891 Unspecified atrial fibrillation: Secondary | ICD-10-CM

## 2020-06-27 LAB — COAGUCHEK XS/INR WAIVED
INR: 2.5 — ABNORMAL HIGH (ref 0.9–1.1)
Prothrombin Time: 30 s

## 2020-06-27 NOTE — Progress Notes (Signed)
Assessment & Plan:  1-2. Atrial fibrillation, unspecified type (HCC)/Chronic anticoagulation Description   Continue warfarin 1.5 mg on Tuesday and 3 mg on all other days.   INR was 2.5 today (goal is 2.0 to 3.0). Next INR in 6 weeks.      - CoaguChek XS/INR Waived  3. Decrease in appetite - Patient does not desire a change in medication. Encouraged to keep appointment with GI.   4. RUQ abdominal pain - Somewhat improved. Encouraged to keep appointment with GI.    Return in about 6 weeks (around 08/08/2020) for follow-up of chronic medication conditions.  [name], MSN, APRN, FNP-C [facility]  Subjective:    Patient ID: Norma Barajas, female    DOB: 11-19-1940, 79 y.o.   MRN: [id number]  Patient Care Team: Loman Brooklyn, FNP as PCP - General (Family Medicine) Minus Breeding, MD as PCP - Cardiology (Cardiology) [name], MD (Inactive) as Attending Physician (Gastroenterology) Melina Schools, OD as Consulting Physician (Optometry) Sherran Needs, NP as Consulting Physician (Cardiology)   Chief Complaint:  Chief Complaint  Patient presents with  . Coagulation Disorder    HPI: Norma Barajas is a 79 y.o. female presenting on 06/27/2020 for Coagulation Disorder  Anticoagulation: Patient here for anticoagulation monitoring. Indication: atrial fibrillation Bleeding Signs/Symptoms:  None Thromboembolic Signs/Symptoms:  None  Missed Coumadin Doses:  None Medication Changes:  no Dietary Changes:  no Bacterial/Viral Infection:  no   New complaints: Patient reports she only took the Remeron x1 night because of the way it made her feel the next day. She was given this due to decreased appetite. She does not wish to try another medication.   Patient is scheduled to see GI next month due to her appetite and abdominal pain. She mentioned cancelling this appointment as she doesn't feel as bad as she did.    Social history:  Relevant past  medical, surgical, family and social history reviewed and updated as indicated. Interim medical history since our last visit reviewed.  Allergies and medications reviewed and updated.  DATA REVIEWED: CHART IN EPIC  ROS: Negative unless specifically indicated above in HPI.    Current Outpatient Medications:  .  acetaminophen (TYLENOL) 500 MG tablet, Take 1,000 mg by mouth every 6 (six) hours as needed for moderate pain or headache., Disp: , Rfl:  .  amiodarone (PACERONE) 200 MG tablet, Take 1 tablet (200 mg total) by mouth daily., Disp: 90 tablet, Rfl: 1 .  amLODipine (NORVASC) 10 MG tablet, Take 1 tablet (10 mg total) by mouth every evening., Disp: , Rfl:  .  Calcium Carbonate-Vitamin D (CALCIUM-VITAMIN D3 PO), Take 1 tablet by mouth daily., Disp: , Rfl:  .  cholecalciferol (VITAMIN D3) 25 MCG (1000 UT) tablet, Take 1,000 Units by mouth daily., Disp: , Rfl:  .  furosemide (LASIX) 40 MG tablet, Take 1 tablet (40 mg total) by mouth daily., Disp: 90 tablet, Rfl: 2 .  loratadine (CLARITIN) 10 MG tablet, Take 10 mg by mouth daily as needed for allergies., Disp: , Rfl:  .  metoprolol tartrate (LOPRESSOR) 25 MG tablet, Take 1.5 tablets (37.5 mg total) by mouth 2 (two) times daily., Disp: 180 tablet, Rfl: 3 .  mirtazapine (REMERON) 15 MG tablet, Take 1 tablet (15 mg total) by mouth at bedtime., Disp: 30 tablet, Rfl: 2 .  Multiple Vitamin (MULTIVITAMIN) capsule, Take 1 capsule by mouth every evening., Disp: , Rfl:  .  Omega-3 Fatty Acids (FISH OIL)  1000 MG CAPS, Take 1,000 mg by mouth 2 (two) times daily. , Disp: , Rfl:  .  omeprazole (PRILOSEC) 20 MG capsule, Take 1 capsule (20 mg total) by mouth daily., Disp: 90 capsule, Rfl: 3 .  potassium chloride (KLOR-CON) 10 MEQ tablet, Take 3 tablets (30 mEq total) by mouth daily with breakfast., Disp: 60 tablet, Rfl: 3 .  simvastatin (ZOCOR) 20 MG tablet, Take 1 tablet (20 mg total) by mouth daily., Disp: 90 tablet, Rfl: 1 .  warfarin (COUMADIN) 3 MG tablet,  TAKE 1/2 TO 1 TABLET EVERY DAY AS DIRECTED BY  ANTICOAGULATION CLINIC (Patient taking differently: Take 1.5-3 mg by mouth See admin instructions. Take 3 mg daily at night except take 1.5 mg at night on Tuesdays), Disp: 90 tablet, Rfl: 0   Allergies  Allergen Reactions  . Codeine Nausea And Vomiting  . Doxycycline Other (See Comments)    Chest congestion  . Triamterene-Hctz Other (See Comments)    weakness  . Atorvastatin Other (See Comments)    Myalgias   . Crestor [Rosuvastatin Calcium] Other (See Comments)    weakness  . Morphine Nausea Only  . Risedronate Sodium Other (See Comments)    ACTONEL - reflux   Past Medical History:  Diagnosis Date  . 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 Anxiety   . Arthritis    "right leg" (04/11/2015)  . BPPV (benign paroxysmal positional vertigo) 03/23/2016  . Cataract   . Coronary atherosclerosis of native coronary artery    a. Nonobstructive minimal CAD 10/2005.  Marland Kitchen Depression   . Diastolic dysfunction    Grade 1. Ejection fraction 60-65%.  Marland Kitchen Dysrhythmia    a fib  . Erosive esophagitis   . Essential hypertension   . GERD (gastroesophageal reflux disease)   . Grade III hemorrhoids   . History of hiatal hernia   . Hypercholesterolemia   . Internal hemorrhoids with complication 0/97/3532   OCT 2015 FLEX SIG/IH BANDING    . Migraine    "used to have them right bad; I don't now" (04/11/2015)  . MITRAL REGURGITATION 04/27/2010   Qualifier: Diagnosis of  By: [name], MD, [name]   . Osteoporosis   . Paroxysmal atrial fibrillation (HCC)   . PONV (postoperative nausea and vomiting)   . Vertigo     Past Surgical History:  Procedure Laterality Date  . BRAVO [place] STUDY  11/15/2012   Procedure: BRAVO [place];  Surgeon: [name], MD;  [facility];  Service: Endoscopy;;  . CARDIOVERSION N/A 05/16/2020   Procedure: CARDIOVERSION;  Surgeon: Donato Heinz, MD;  Location: [facility] ENDOSCOPY;  Service:  Cardiovascular;  Laterality: N/A;  . CATARACT EXTRACTION W/ INTRAOCULAR LENS  IMPLANT, BILATERAL Bilateral   . COLONOSCOPY  2008   Dr. [name]: internal hemorrhoids   . DILATION AND CURETTAGE OF UTERUS    . ESOPHAGOGASTRODUODENOSCOPY (EGD) WITH ESOPHAGEAL DILATION  2001   Dr. [name]: erosive esophagitis, esophageal stricture, duodenitis, s/p Savary dilation  . ESOPHAGOGASTRODUODENOSCOPY (EGD) WITH ESOPHAGEAL DILATION  11/15/2012   [id number] web was found & MOST LIKELY CAUSE FOR DYAPHAGIA/Polyp was found in the gastric body and gastric fundus/ gastritis on bx  . EYE SURGERY Bilateral    "laser OR after cataract OR; cause I couldn't see"  . FLEXIBLE SIGMOIDOSCOPY N/A 08/22/2014   mild diverticulosis in sigmoid, moderate sized Grade 3 hemorrhoids s/p banding X 3.   Marland Kitchen FRACTURE SURGERY Right    below the knee -  2 bones broke has plates  . HEMORRHOID BANDING N/A 08/22/2014   Procedure: HEMORRHOID BANDING;  Surgeon: [name], MD;  Location: [facility];  Service: Endoscopy;  Laterality: N/A;  . HEMORRHOID SURGERY N/A 03/30/2018   Procedure: EXTENSIVE HEMORRHOIDECTOMY;  Surgeon: [name], MD;  Location: [facility] ORS;  Service: General;  Laterality: N/A;  . OPEN REDUCTION INTERNAL FIXATION (ORIF) TIBIA/FIBULA FRACTURE Right 2013   broke tibia and fibula after falling down stairs  . PROLAPSED UTERINE FIBROID LIGATION  2015  . TOTAL ABDOMINAL HYSTERECTOMY      Social History   Socioeconomic History  . Marital status: Married    Spouse name: george   . Number of children: 1  . Years of education: Not on file  . Highest education level: Not on file  Occupational History  . Occupation: Retired    Comment: Textile  Tobacco Use  . Smoking status: Never Smoker  . Smokeless tobacco: Never Used  Vaping Use  . Vaping Use: Never used  Substance and Sexual Activity  . Alcohol use: No    Alcohol/week: 0.0 standard drinks  . Drug use: No  . Sexual activity: Not Currently    Birth  control/protection: Surgical, Post-menopausal    Comment: hyst  Other Topics Concern  . Not on file  Social History Narrative   Married   No regular exercise   Social Determinants of Health   Financial Resource Strain:   . Difficulty of Paying Living Expenses: Not on file  Food Insecurity:   . Worried About Charity fundraiser in the Last Year: Not on file  . Ran Out of Food in the Last Year: Not on file  Transportation Needs:   . Lack of Transportation (Medical): Not on file  . Lack of Transportation (Non-Medical): Not on file  Physical Activity:   . Days of Exercise per Week: Not on file  . Minutes of Exercise per Session: Not on file  Stress:   . Feeling of Stress : Not on file  Social Connections:   . Frequency of Communication with Friends and Family: Not on file  . Frequency of Social Gatherings with Friends and Family: Not on file  . Attends Religious Services: Not on file  . Active Member of Clubs or Organizations: Not on file  . Attends Archivist Meetings: Not on file  . Marital Status: Not on file  Intimate Partner Violence:   . Fear of Current or Ex-Partner: Not on file  . Emotionally Abused: Not on file  . Physically Abused: Not on file  . Sexually Abused: Not on file        Objective:    BP 115/78   Pulse 82   Temp (!) 97.2 F (36.2 C) (Temporal)   Ht 5\' 1"  (1.549 m)   Wt 122 lb (55.3 kg)   SpO2 95%   BMI 23.05 kg/m   Wt Readings from Last 3 Encounters:  06/27/20 122 lb (55.3 kg)  06/05/20 123 lb 9.6 oz (56.1 kg)  05/30/20 125 lb 12.8 oz (57.1 kg)    Physical Exam Vitals reviewed.  Constitutional:      General: She is not in acute distress.    Appearance: Normal appearance. She is normal weight. She is not ill-appearing, toxic-appearing or diaphoretic.  HENT:     Head: Normocephalic and atraumatic.  Eyes:     General: No scleral icterus.       Right eye: No discharge.  Left eye: No discharge.     Conjunctiva/sclera:  Conjunctivae normal.  Cardiovascular:     Rate and Rhythm: Normal rate. Rhythm irregular.     Heart sounds: Normal heart sounds. No murmur heard.  No friction rub. No gallop.   Pulmonary:     Effort: Pulmonary effort is normal. No respiratory distress.     Breath sounds: Normal breath sounds. No stridor. No wheezing, rhonchi or rales.  Musculoskeletal:        General: Normal range of motion.     Cervical back: Normal range of motion.  Skin:    General: Skin is warm and dry.     Capillary Refill: Capillary refill takes less than 2 seconds.  Neurological:     General: No focal deficit present.     Mental Status: She is alert and oriented to person, place, and time. Mental status is at baseline.  Psychiatric:        Mood and Affect: Mood normal.        Behavior: Behavior normal.        Thought Content: Thought content normal.        Judgment: Judgment normal.     Lab Results  Component Value Date   TSH 0.805 05/20/2020   Lab Results  Component Value Date   WBC 6.0 12/05/2019   HGB 15.7 12/05/2019   HCT 47.1 (H) 12/05/2019   MCV 95 12/05/2019   PLT 181 12/05/2019   Lab Results  Component Value Date   NA 140 05/20/2020   K 3.3 (L) 05/20/2020   CO2 26 05/20/2020   GLUCOSE 117 (H) 05/20/2020   BUN 9 05/20/2020   CREATININE 1.00 05/20/2020   BILITOT 0.6 05/20/2020   ALKPHOS 62 05/20/2020   AST 28 05/20/2020   ALT 18 05/20/2020   PROT 6.7 05/20/2020   ALBUMIN 3.8 05/20/2020   CALCIUM 8.8 (L) 05/20/2020   ANIONGAP 15 05/20/2020   Lab Results  Component Value Date   CHOL 186 12/05/2019   Lab Results  Component Value Date   HDL 48 12/05/2019   Lab Results  Component Value Date   LDLCALC 98 12/05/2019   Lab Results  Component Value Date   TRIG 235 (H) 12/05/2019   Lab Results  Component Value Date   CHOLHDL 3.9 12/05/2019   Lab Results  Component Value Date   HGBA1C 5.6 02/28/2017

## 2020-06-30 ENCOUNTER — Encounter: Payer: Self-pay | Admitting: Family Medicine

## 2020-07-07 ENCOUNTER — Other Ambulatory Visit: Payer: Self-pay | Admitting: Family Medicine

## 2020-07-07 DIAGNOSIS — I482 Chronic atrial fibrillation, unspecified: Secondary | ICD-10-CM

## 2020-07-15 ENCOUNTER — Ambulatory Visit (INDEPENDENT_AMBULATORY_CARE_PROVIDER_SITE_OTHER): Payer: Medicare HMO | Admitting: Internal Medicine

## 2020-07-15 ENCOUNTER — Encounter (INDEPENDENT_AMBULATORY_CARE_PROVIDER_SITE_OTHER): Payer: Self-pay | Admitting: Internal Medicine

## 2020-07-15 ENCOUNTER — Other Ambulatory Visit: Payer: Self-pay

## 2020-07-15 DIAGNOSIS — R6881 Early satiety: Secondary | ICD-10-CM | POA: Diagnosis not present

## 2020-07-15 DIAGNOSIS — R630 Anorexia: Secondary | ICD-10-CM | POA: Diagnosis not present

## 2020-07-15 DIAGNOSIS — R109 Unspecified abdominal pain: Secondary | ICD-10-CM | POA: Insufficient documentation

## 2020-07-15 MED ORDER — HYOSCYAMINE SULFATE 0.125 MG SL SUBL: 0.1250 mg | SUBLINGUAL_TABLET | Freq: Two times a day (BID) | SUBLINGUAL | 1 refills | Status: DC | PRN

## 2020-07-15 MED ORDER — HYOSCYAMINE SULFATE 0.125 MG SL SUBL
0.1250 mg | SUBLINGUAL_TABLET | Freq: Two times a day (BID) | SUBLINGUAL | 1 refills | Status: DC | PRN
Start: 2020-07-15 — End: 2020-07-15

## 2020-07-15 NOTE — Progress Notes (Signed)
Reason for consultation  Early satiety and right upper quadrant abdominal pain.  History of present illness.  Patient is 79 year old Caucasian female who has been referred through courtesy of Ms. Thelma Barge for GI evaluation. Patient states she has not been feeling well for 3 months.  She has noted intermittent right upper quadrant abdominal pain which seem to be worse after meals.  Pain radiates laterally but not into her back or shoulder.  She has poor appetite and has not lost any weight.  She states her weight fluctuates depending on how much fluid she has in her lower extremities.  She also has poor appetite.  She has not experienced nausea or vomiting.  She had abdominal ultrasound on 05/28/2020 and it revealed hepatic cyst but no evidence of choledocholithiasis gallbladder wall thickening or dilated bile duct.  She did have 4 mm polyp in the gallbladder.  Patient states heartburn is well controlled with omeprazole.  She denies dysphagia.  She also complains of lower abdominal cramping which occurs after a bowel movement and it may last for 2 hours.  She says lower abdominal discomfort does help with Tums.  Her bowels move every day.  She denies melena or rectal bleeding.  She also denies history of peptic ulcer disease. She also complains of feeling tired and weak all the time and unable to performed usual tasks at home. She says she is scheduled for blood work on October 1 prior to her office visit with PCP.  Prior GI work-up is as follows.  Esophagogastroduodenoscopy with esophageal dilation and February 2001(Dr. [name]); she had erosive esophagitis and distal esophageal stricture. Normal colonoscopy in June 2004(Dr. Sharlett Iles) She had colonoscopy in December 2008 revealing large internal hemorrhoids(Dr. [name]). Esophagogastroduodenoscopy with Bravo study in January 2014(Dr. Fields).  pH study was normal on PPI. Flexible sigmoidoscopy with hemorrhoidal banding in October 2015 when  she was also found to have mild sigmoid diverticulosis(Dr. [name]).   Current Medications: Outpatient Encounter Medications as of 07/15/2020  Medication Sig  . acetaminophen (TYLENOL) 500 MG tablet Take 1,000 mg by mouth every 6 (six) hours as needed for moderate pain or headache.  Marland Kitchen amiodarone (PACERONE) 200 MG tablet Take 1 tablet (200 mg total) by mouth daily.  Marland Kitchen amLODipine (NORVASC) 10 MG tablet Take 1 tablet (10 mg total) by mouth every evening.  . Calcium Carbonate-Vitamin D (CALCIUM-VITAMIN D3 PO) Take 1 tablet by mouth daily.  . cholecalciferol (VITAMIN D3) 25 MCG (1000 UT) tablet Take 1,000 Units by mouth daily.  . furosemide (LASIX) 40 MG tablet Take 1 tablet (40 mg total) by mouth daily.  Marland Kitchen loratadine (CLARITIN) 10 MG tablet Take 10 mg by mouth daily as needed for allergies.  . metoprolol tartrate (LOPRESSOR) 25 MG tablet Take 1.5 tablets (37.5 mg total) by mouth 2 (two) times daily.  . Multiple Vitamin (MULTIVITAMIN) capsule Take 1 capsule by mouth every evening.  . Omega-3 Fatty Acids (FISH OIL) 1000 MG CAPS Take 1,000 mg by mouth 2 (two) times daily.   Marland Kitchen omeprazole (PRILOSEC) 20 MG capsule Take 1 capsule (20 mg total) by mouth daily.  . potassium chloride (KLOR-CON) 10 MEQ tablet Take 3 tablets (30 mEq total) by mouth daily with breakfast.  . simvastatin (ZOCOR) 20 MG tablet Take 1 tablet (20 mg total) by mouth daily.  Marland Kitchen warfarin (COUMADIN) 3 MG tablet TAKE 1/2 TO 1 TABLET EVERY DAY AS DIRECTED BY  ANTICOAGULATION CLINIC   No facility-administered encounter medications on file as of 07/15/2020.   Past  Medical History:  Diagnosis Date  . 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 Anxiety   . Arthritis    "right leg" (04/11/2015)  . BPPV (benign paroxysmal positional vertigo) 03/23/2016  . Cataract   . Coronary atherosclerosis of native coronary artery    a. Nonobstructive minimal CAD 10/2005.  Marland Kitchen Depression   . Diastolic dysfunction    Grade 1. Ejection fraction 60-65%.   Marland Kitchen Dysrhythmia    a fib  . Erosive esophagitis   . Essential hypertension   . GERD (gastroesophageal reflux disease)   . Grade III hemorrhoids   . History of hiatal hernia   . Hypercholesterolemia   . Internal hemorrhoids with complication 12/18/3126   OCT 2015 FLEX SIG/IH BANDING    . Migraine    "used to have them right bad; I don't now" (04/11/2015)  . MITRAL REGURGITATION 04/27/2010   Qualifier: Diagnosis of  By: [name], MD, [name]   . Osteoporosis   . Paroxysmal atrial fibrillation (HCC)   . PONV (postoperative nausea and vomiting)   . Vertigo    Past Surgical History:  Procedure Laterality Date  . BRAVO [place] STUDY  11/15/2012   Procedure: BRAVO [place];  Surgeon: [name], MD;  [facility];  Service: Endoscopy;;  . CARDIOVERSION N/A 05/16/2020   Procedure: CARDIOVERSION;  Surgeon: Donato Heinz, MD;  Location: [facility] ENDOSCOPY;  Service: Cardiovascular;  Laterality: N/A;  . CATARACT EXTRACTION W/ INTRAOCULAR LENS  IMPLANT, BILATERAL Bilateral   . COLONOSCOPY  2008   Dr. [name]: internal hemorrhoids   . DILATION AND CURETTAGE OF UTERUS    . ESOPHAGOGASTRODUODENOSCOPY (EGD) WITH ESOPHAGEAL DILATION  2001   Dr. [name]: erosive esophagitis, esophageal stricture, duodenitis, s/p Savary dilation  . ESOPHAGOGASTRODUODENOSCOPY (EGD) WITH ESOPHAGEAL DILATION  11/15/2012   [id number] web was found & MOST LIKELY CAUSE FOR DYAPHAGIA/Polyp was found in the gastric body and gastric fundus/ gastritis on bx  . EYE SURGERY Bilateral    "laser OR after cataract OR; cause I couldn't see"  . FLEXIBLE SIGMOIDOSCOPY N/A 08/22/2014   mild diverticulosis in sigmoid, moderate sized Grade 3 hemorrhoids s/p banding X 3.   Marland Kitchen FRACTURE SURGERY Right    below the knee - 2 bones broke has plates  . HEMORRHOID BANDING N/A 08/22/2014   Procedure: HEMORRHOID BANDING;  Surgeon: [name], MD;  Location: [facility];  Service: Endoscopy;  Laterality: N/A;  . HEMORRHOID  SURGERY N/A 03/30/2018   Procedure: EXTENSIVE HEMORRHOIDECTOMY;  Surgeon: [name], MD;  Location: [facility] ORS;  Service: General;  Laterality: N/A;  . OPEN REDUCTION INTERNAL FIXATION (ORIF) TIBIA/FIBULA FRACTURE Right 2013   broke tibia and fibula after falling down stairs  . PROLAPSED UTERINE FIBROID LIGATION  2015  . TOTAL ABDOMINAL HYSTERECTOMY     Allergies:  Allergies  Allergen Reactions  . Codeine Nausea And Vomiting  . Doxycycline Other (See Comments)    Chest congestion  . Triamterene-Hctz Other (See Comments)    weakness  . Atorvastatin Other (See Comments)    Myalgias   . Crestor [Rosuvastatin Calcium] Other (See Comments)    weakness  . Morphine Nausea Only  . Risedronate Sodium Other (See Comments)    ACTONEL - reflux    Family history  Father died of heart disease at age 41. Mother was diagnosed with colon carcinoma in her 27s and lived to be 34. She has 1 brother living who is in his seventies with  multiple health problems including history of stroke.  1 brother and 4 sisters are disease.  Brother died of renal cancer at age 65. 2 sisters had a stroke and when had crippling rheumatoid arthritis.  Not sure about fourth sister.  Social history  she is married.  She lost her only son about 9 weeks ago because of auto accident.  He was 81 years old.  He suffered auto accident 3 weeks before his demise.  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  She has never smoked cigarettes and does not drink alcohol.   Objective: Blood pressure 117/82, pulse 72, temperature 97.6 F (36.4 C), temperature source Oral, height 5' 1"  (1.549 m), weight 121 lb 9.6 oz (55.2 kg).  Patient is alert and in no acute distress. She is wearing a mask. Conjunctiva is pink. Sclera is nonicteric Oropharyngeal mucosa is normal. She has upper and lower dentures in place. No neck masses or thyromegaly noted. Cardiac exam withir regular rhythm normal S1 and S2.  She  has faint systolic murmur heard at aortic and left sternal border. Lungs are clear to auscultation. Abdomen is symmetrical.  Bowel sounds are normal.  On palpation abdomen is soft and nontender without organomegaly or masses.  Left lobe of the liver is easily palpable in midepigastric region.  It is nontender. She has trace edema involving both ankles right greater than left.  Labs/studies Results:   CBC Latest Ref Rng & Units 12/05/2019 07/10/2019 12/04/2018  WBC 3.4 - 10.8 x10E3/uL 6.0 7.5 4.8  Hemoglobin 11.1 - 15.9 g/dL 15.7 16.1(H) 14.2  Hematocrit 34.0 - 46.6 % 47.1(H) 46.9(H) 42.4  Platelets 150 - 450 x10E3/uL 181 201 199    CMP Latest Ref Rng & Units 05/20/2020 05/06/2020 04/21/2020  Glucose 70 - 99 mg/dL 117(H) 113(H) 101(H)  BUN 8 - 23 mg/dL 9 12 16   Creatinine 0.44 - 1.00 mg/dL 1.00 0.98 1.01(H)  Sodium 135 - 145 mmol/L 140 140 141  Potassium 3.5 - 5.1 mmol/L 3.3(L) 3.5 3.5  Chloride 98 - 111 mmol/L 99 98 99  CO2 22 - 32 mmol/L 26 25 27   Calcium 8.9 - 10.3 mg/dL 8.8(L) 9.2 9.1  Total Protein 6.5 - 8.1 g/dL 6.7 - -  Total Bilirubin 0.3 - 1.2 mg/dL 0.6 - -  Alkaline Phos 38 - 126 U/L 62 - -  AST 15 - 41 U/L 28 - -  ALT 0 - 44 U/L 18 - -    Hepatic Function Latest Ref Rng & Units 05/20/2020 12/05/2019 07/10/2019  Total Protein 6.5 - 8.1 g/dL 6.7 7.5 8.3(H)  Albumin 3.5 - 5.0 g/dL 3.8 4.6 4.5  AST 15 - 41 U/L 28 22 31   ALT 0 - 44 U/L 18 14 23   Alk Phosphatase 38 - 126 U/L 62 86 74  Total Bilirubin 0.3 - 1.2 mg/dL 0.6 0.3 0.6  Bilirubin, Direct 0.1 - 0.5 mg/dL - - -    TSH 0.805 on 05/20/2020  ultrasound 05/28/2020 revealed 4 mm gallbladder polyp but no evidence of stones.  Also reviewed abdominal pelvic CT from 07/10/2019 revealing stable right adrenal adenoma hepatic cysts and atherosclerosis.   Assessment:  #1.  Right upper quadrant abdominal pain.  Pain is usually postprandial associated with early satiety and anorexia but no weight loss.  Recent ultrasound negative for  cholelithiasis or dilated bile duct.  Differential diagnosis includes peptic ulcer disease or biliary dyskinesia.  May also need to evaluate pancreas if she  does not have peptic ulcer disease or gallbladder dysfunction causing her symptoms.  #2.  Early satiety and anorexia.  The symptoms may be due to peptic ulcer disease medication i.e. amiodarone or stress induced  given that she lost her son past June.  #3.  Postprandial lower abdominal cramping most likely due to IBS.  #4.  History of erosive reflux esophagitis.  Heartburn is well controlled with dietary measures and PPI.  #5.  Patient is deemed to be average risk for CRC as her mother had colon carcinoma in her seventies.  She had last full colonoscopy was in December 2008 and she also had a flexible sigmoidoscopy in 2015.  No indication for colonoscopy at this time.  #6.  Weakness.  Suspect weakness secondary to her medications.  TSH normal in July of this year.  Will check hemoglobin and serum potassium.  Recommendations:  Patient will go to the lab for CBC with differential, comprehensive chemistry panel and serum lipase. Hyoscyamine sublingual 1 tablet twice daily as needed.  She can try this medication both her right upper quadrant and lower abdominal pain.  Patient informed of potential side effects such as dry mouth and constipation.  Likelihood is low as she is going to be using it on as-needed basis. If lab studies are normal will proceed with esophagogastroduodenoscopy.  She will have to be off warfarin for 5 days.  Will contact Dr. Percival Spanish for his recommendations and procedure scheduled.

## 2020-07-15 NOTE — Progress Notes (Signed)
Cardiology Office Note   Date:  07/16/2020   ID:  Kynlei, Piontek 1940/12/17, MRN [id number]  PCP:  Loman Brooklyn, FNP  Cardiologist:   Minus Breeding, MD   Chief Complaint  Patient presents with  . Atrial Fibrillation       History of Present Illness: Norma Barajas is a 79 y.o. female who presents for follow up of atrial fibrillation.  She has been seen by Dr. [name] and Dr. Curt Bears.  She did have an essentially normal echo in 2017.  She was treated with Tikosyn for a while but could not afford this.  She has been treated with Sotalol.  She has had recurrent fibrillation and was followed in the Atrial Fib clinic.  She has been treated with amiodarone and DCCV and she had recurrent fib.  She was to have another DCCV but she declined and wanted to pursue rate control with amiodarone.    She reports that she thinks palpitations are less pronounced.  She is noticing less at night.  She has not had any presyncope or syncope.  She is fatigued but this is baseline.  She does have some lower extremity swelling.  She denies any chest pressure, neck or arm discomfort.  She has not had any shortness of breath, PND or orthopnea.  She has had no weight gain.   Past Medical History:  Diagnosis Date  . 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 Anxiety   . Arthritis    "right leg" (04/11/2015)  . BPPV (benign paroxysmal positional vertigo) 03/23/2016  . Cataract   . Coronary atherosclerosis of native coronary artery    a. Nonobstructive minimal CAD 10/2005.  Marland Kitchen Depression   . Diastolic dysfunction    Grade 1. Ejection fraction 60-65%.  Marland Kitchen Dysrhythmia    a fib  . Erosive esophagitis   . Essential hypertension   . GERD (gastroesophageal reflux disease)   . Grade III hemorrhoids   . History of hiatal hernia   . Hypercholesterolemia   . Internal hemorrhoids with complication 5/39/7673   OCT 2015 FLEX SIG/IH BANDING    . Migraine    "used to have them right bad; I don't now"  (04/11/2015)  . MITRAL REGURGITATION 04/27/2010   Qualifier: Diagnosis of  By: [name], MD, [name]   . Osteoporosis   . Paroxysmal atrial fibrillation (HCC)   . PONV (postoperative nausea and vomiting)   . Vertigo     Past Surgical History:  Procedure Laterality Date  . BRAVO [place] STUDY  11/15/2012   Procedure: BRAVO [place];  Surgeon: [name], MD;  Location: [facility] SU[facility];  Service: Endoscopy;;  . CARDIOVERSION N/A 05/16/2020   Procedure: CARDIOVERSION;  Surgeon: Donato Heinz, MD;  Location: [facility] ENDOSCOPY;  Service: Cardiovascular;  Laterality: N/A;  . CATARACT EXTRACTION W/ INTRAOCULAR LENS  IMPLANT, BILATERAL Bilateral   . COLONOSCOPY  2008   Dr. [name]: internal hemorrhoids   . DILATION AND CURETTAGE OF UTERUS    . ESOPHAGOGASTRODUODENOSCOPY (EGD) WITH ESOPHAGEAL DILATION  2001   Dr. [name]: erosive esophagitis, esophageal stricture, duodenitis, s/p Savary dilation  . ESOPHAGOGASTRODUODENOSCOPY (EGD) WITH ESOPHAGEAL DILATION  11/15/2012   ALP:[id number] web was found & MOST LIKELY CAUSE FOR DYAPHAGIA/Polyp was found in the gastric body and gastric fundus/ gastritis on bx  . EYE SURGERY Bilateral    "laser OR after cataract OR; cause I couldn't see"  . FLEXIBLE SIGMOIDOSCOPY N/A 08/22/2014  mild diverticulosis in sigmoid, moderate sized Grade 3 hemorrhoids s/p banding X 3.   Marland Kitchen FRACTURE SURGERY Right    below the knee - 2 bones broke has plates  . HEMORRHOID BANDING N/A 08/22/2014   Procedure: HEMORRHOID BANDING;  Surgeon: [name], MD;  Location: [facility];  Service: Endoscopy;  Laterality: N/A;  . HEMORRHOID SURGERY N/A 03/30/2018   Procedure: EXTENSIVE HEMORRHOIDECTOMY;  Surgeon: [name], MD;  Location: [facility] ORS;  Service: General;  Laterality: N/A;  . OPEN REDUCTION INTERNAL FIXATION (ORIF) TIBIA/FIBULA FRACTURE Right 2013   broke tibia and fibula after falling down stairs  . PROLAPSED UTERINE FIBROID LIGATION  2015  . TOTAL ABDOMINAL  HYSTERECTOMY       Current Outpatient Medications  Medication Sig Dispense Refill  . acetaminophen (TYLENOL) 500 MG tablet Take 1,000 mg by mouth 2 (two) times daily.     Marland Kitchen amiodarone (PACERONE) 200 MG tablet Take 1 tablet (200 mg total) by mouth daily. 90 tablet 1  . Calcium Carbonate-Vitamin D (CALCIUM-VITAMIN D3 PO) Take 1 tablet by mouth daily.    . cholecalciferol (VITAMIN D3) 25 MCG (1000 UT) tablet Take 1,000 Units by mouth daily.    . furosemide (LASIX) 40 MG tablet Take 1 tablet (40 mg total) by mouth daily. 90 tablet 2  . hyoscyamine (LEVSIN SL) 0.125 MG SL tablet Place 1 tablet (0.125 mg total) under the tongue 2 (two) times daily as needed. 60 tablet 1  . loratadine (CLARITIN) 10 MG tablet Take 10 mg by mouth daily as needed for allergies.    . metoprolol tartrate (LOPRESSOR) 50 MG tablet Take 1 tablet (50 mg total) by mouth 2 (two) times daily. 180 tablet 3  . Multiple Vitamin (MULTIVITAMIN) capsule Take 1 capsule by mouth every evening.    . Omega-3 Fatty Acids (FISH OIL) 1000 MG CAPS Take 1,000 mg by mouth 2 (two) times daily.     Marland Kitchen omeprazole (PRILOSEC) 20 MG capsule Take 1 capsule (20 mg total) by mouth daily. 90 capsule 3  . potassium chloride (KLOR-CON) 10 MEQ tablet Take 3 tablets (30 mEq total) by mouth daily with breakfast. 60 tablet 3  . simvastatin (ZOCOR) 20 MG tablet Take 1 tablet (20 mg total) by mouth daily. 90 tablet 1  . warfarin (COUMADIN) 3 MG tablet TAKE 1/2 TO 1 TABLET EVERY DAY AS DIRECTED BY  ANTICOAGULATION CLINIC 90 tablet 0   No current facility-administered medications for this visit.    Allergies:   Codeine, Doxycycline, Triamterene-hctz, Atorvastatin, Crestor [rosuvastatin calcium], Morphine, and Risedronate sodium    ROS:  Please see the history of present illness.   Otherwise, review of systems are positive for none.   All other systems are reviewed and negative.    PHYSICAL EXAM: VS:  BP 110/80   Pulse (!) 105   Ht 5\' 1"  (1.549 m)   Wt  122 lb (55.3 kg)   BMI 23.05 kg/m  , BMI Body mass index is 23.05 kg/m. GENERAL:  Well appearing NECK:  No jugular venous distention, waveform within normal limits, carotid upstroke brisk and symmetric, no bruits, no thyromegaly LUNGS:  Clear to auscultation bilaterally CHEST:  Unremarkable HEART:  PMI not displaced or sustained,S1 and S2 within normal limits, no S3, no clicks, no rubs, no murmurs, irregular ABD:  Flat, positive bowel sounds normal in frequency in pitch, no bruits, no rebound, no guarding, no midline pulsatile mass, no hepatomegaly, no splenomegaly EXT:  2 plus pulses throughout, mild  bilateral leg edema, no cyanosis no clubbing   EKG:  EKG is  ordered today. Atrial fibrillation, rate 105, leftward axis, poor anterior R wave progression, QTC prolonged, diffuse nonspecific T wave flattening.   Recent Labs: 05/20/2020: TSH 0.805 07/15/2020: ALT 11; BUN 14; Creat 0.87; Hemoglobin 15.8; Platelets 150; Potassium 3.3; Sodium 142    Lipid Panel    Component Value Date/Time   CHOL 186 12/05/2019 0815   CHOL 192 05/15/2013 0829   TRIG 235 (H) 12/05/2019 0815   TRIG 258 (H) 03/19/2014 0812   TRIG 179 (H) 05/15/2013 0829   HDL 48 12/05/2019 0815   HDL 45 03/19/2014 0812   HDL 44 05/15/2013 0829   CHOLHDL 3.9 12/05/2019 0815   CHOLHDL 3.9 02/10/2016 0208   VLDL 33 02/10/2016 0208   LDLCALC 98 12/05/2019 0815   LDLCALC 101 (H) 03/19/2014 0812   LDLCALC 112 (H) 05/15/2013 0829      Wt Readings from Last 3 Encounters:  07/16/20 122 lb (55.3 kg)  07/15/20 121 lb 9.6 oz (55.2 kg)  06/27/20 122 lb (55.3 kg)      Other studies Reviewed: Additional studies/ records that were reviewed today include:  Atrial Fib Clinic Notes Review of the above records demonstrates:  See elsewhere   ASSESSMENT AND PLAN:  ATRIAL FIBRILLATION:         She wants to concentrate on rate control and anticoagulation.  She could not afford Tikosyn.  Sotalol caused QT prolongation.  I am going  to increase her metoprolol to 50 mg twice daily and stop her amlodipine.  This may help her leg swelling as well.   She tolerates her anticoagulation.  I will see her back in a couple months and follow-up with thyroid and liver enzymes.  HTN:   This will be treated in the context of managing her atrial fibrillation.  COVID EDUCATION: She has received both of her vaccines.   Current medicines are reviewed at length with the patient today.  The patient does not have concerns regarding medicines.  The following changes have been made:  None  Labs/ tests ordered today include:  None  Orders Placed This Encounter  Procedures  . EKG 12-Lead     Disposition:   FU with me in 2 months     Signed, Minus Breeding, MD  07/16/2020 11:10 AM    [place]

## 2020-07-15 NOTE — Patient Instructions (Signed)
Physician will call with results of blood tests and further recommendations. Use hyoscyamine tablet sublingual just before he have a bowel movement and he could also try for right upper quadrant abdominal pain.  Can take up to 2 doses per day.

## 2020-07-16 ENCOUNTER — Ambulatory Visit: Payer: Medicare HMO | Admitting: Cardiology

## 2020-07-16 ENCOUNTER — Encounter: Payer: Self-pay | Admitting: Cardiology

## 2020-07-16 VITALS — BP 110/80 | HR 105 | Ht 61.0 in | Wt 122.0 lb

## 2020-07-16 DIAGNOSIS — Z7189 Other specified counseling: Secondary | ICD-10-CM

## 2020-07-16 DIAGNOSIS — I10 Essential (primary) hypertension: Secondary | ICD-10-CM

## 2020-07-16 DIAGNOSIS — I4819 Other persistent atrial fibrillation: Secondary | ICD-10-CM | POA: Diagnosis not present

## 2020-07-16 LAB — COMPREHENSIVE METABOLIC PANEL
AG Ratio: 1.7 (calc) (ref 1.0–2.5)
ALT: 11 U/L (ref 6–29)
AST: 22 U/L (ref 10–35)
Albumin: 4.7 g/dL (ref 3.6–5.1)
Alkaline phosphatase (APISO): 74 U/L (ref 37–153)
BUN: 14 mg/dL (ref 7–25)
CO2: 31 mmol/L (ref 20–32)
Calcium: 9.1 mg/dL (ref 8.6–10.4)
Chloride: 99 mmol/L (ref 98–110)
Creat: 0.87 mg/dL (ref 0.60–0.88)
Globulin: 2.7 g/dL (calc) (ref 1.9–3.7)
Glucose, Bld: 121 mg/dL (ref 65–139)
Potassium: 3.3 mmol/L — ABNORMAL LOW (ref 3.5–5.3)
Sodium: 142 mmol/L (ref 135–146)
Total Bilirubin: 0.6 mg/dL (ref 0.2–1.2)
Total Protein: 7.4 g/dL (ref 6.1–8.1)

## 2020-07-16 LAB — CBC WITH DIFFERENTIAL/PLATELET
Absolute Monocytes: 540 cells/uL (ref 200–950)
Basophils Absolute: 72 cells/uL (ref 0–200)
Basophils Relative: 1.2 %
Eosinophils Absolute: 102 cells/uL (ref 15–500)
Eosinophils Relative: 1.7 %
HCT: 48.6 % — ABNORMAL HIGH (ref 35.0–45.0)
Hemoglobin: 15.8 g/dL — ABNORMAL HIGH (ref 11.7–15.5)
Lymphs Abs: 1590 cells/uL (ref 850–3900)
MCH: 30.3 pg (ref 27.0–33.0)
MCHC: 32.5 g/dL (ref 32.0–36.0)
MCV: 93.3 fL (ref 80.0–100.0)
MPV: 11.8 fL (ref 7.5–12.5)
Monocytes Relative: 9 %
Neutro Abs: 3696 cells/uL (ref 1500–7800)
Neutrophils Relative %: 61.6 %
Platelets: 150 10*3/uL (ref 140–400)
RBC: 5.21 10*6/uL — ABNORMAL HIGH (ref 3.80–5.10)
RDW: 13.6 % (ref 11.0–15.0)
Total Lymphocyte: 26.5 %
WBC: 6 10*3/uL (ref 3.8–10.8)

## 2020-07-16 LAB — LIPASE: Lipase: 18 U/L (ref 7–60)

## 2020-07-16 MED ORDER — METOPROLOL TARTRATE 50 MG PO TABS: 50.0000 mg | ORAL_TABLET | Freq: Two times a day (BID) | ORAL | 3 refills | Status: DC

## 2020-07-16 NOTE — Patient Instructions (Signed)
Medication Instructions:  Please discontinue your Amlodipine. Increase Metoprolol Tartrate to 50 mg twice a day. Continue all other medications as listed.  *If you need a refill on your cardiac medications before your next appointment, please call your pharmacy*  Follow-Up: At [facility], you and your health needs are our priority.  As part of our continuing mission to provide you with exceptional heart care, we have created designated Provider Care Teams.  These Care Teams include your primary Cardiologist (physician) and Advanced Practice Providers (APPs -  Physician Assistants and Nurse Practitioners) who all work together to provide you with the care you need, when you need it.  We recommend signing up for the patient portal called "MyChart".  Sign up information is provided on this After Visit Summary.  MyChart is used to connect with patients for Virtual Visits (Telemedicine).  Patients are able to view lab/test results, encounter notes, upcoming appointments, etc.  Non-urgent messages can be sent to your provider as well.   To learn more about what you can do with MyChart, go to [email or web address].    Your next appointment:   2 month(s)  The format for your next appointment:   In Person  Provider:   Minus Breeding, MD   Thank you for choosing [facility]!!

## 2020-07-25 ENCOUNTER — Ambulatory Visit (INDEPENDENT_AMBULATORY_CARE_PROVIDER_SITE_OTHER): Payer: Medicare HMO

## 2020-07-25 ENCOUNTER — Other Ambulatory Visit: Payer: Self-pay

## 2020-07-25 DIAGNOSIS — Z23 Encounter for immunization: Secondary | ICD-10-CM

## 2020-07-31 ENCOUNTER — Encounter: Payer: Self-pay | Admitting: Family Medicine

## 2020-07-31 ENCOUNTER — Other Ambulatory Visit: Payer: Self-pay

## 2020-07-31 ENCOUNTER — Ambulatory Visit (INDEPENDENT_AMBULATORY_CARE_PROVIDER_SITE_OTHER): Payer: Medicare HMO | Admitting: Family Medicine

## 2020-07-31 VITALS — BP 146/108 | HR 104 | Temp 97.3°F | Ht 61.0 in | Wt 119.0 lb

## 2020-07-31 DIAGNOSIS — R300 Dysuria: Secondary | ICD-10-CM | POA: Diagnosis not present

## 2020-07-31 DIAGNOSIS — M545 Low back pain, unspecified: Secondary | ICD-10-CM

## 2020-07-31 DIAGNOSIS — R399 Unspecified symptoms and signs involving the genitourinary system: Secondary | ICD-10-CM

## 2020-07-31 LAB — URINALYSIS, COMPLETE
Bilirubin, UA: NEGATIVE
Glucose, UA: NEGATIVE
Ketones, UA: NEGATIVE
Leukocytes,UA: NEGATIVE
Nitrite, UA: NEGATIVE
RBC, UA: NEGATIVE
Specific Gravity, UA: 1.02 (ref 1.005–1.030)
Urobilinogen, Ur: 0.2 mg/dL (ref 0.2–1.0)
pH, UA: 5 (ref 5.0–7.5)

## 2020-07-31 LAB — MICROSCOPIC EXAMINATION
Bacteria, UA: NONE SEEN
RBC, Urine: NONE SEEN /hpf (ref 0–2)
WBC, UA: NONE SEEN /hpf (ref 0–5)

## 2020-07-31 MED ORDER — CIPROFLOXACIN HCL 500 MG PO TABS: 500.0000 mg | ORAL_TABLET | Freq: Two times a day (BID) | ORAL | 0 refills | Status: DC

## 2020-07-31 NOTE — Progress Notes (Signed)
BP (!) 146/108   Pulse (!) 104   Temp (!) 97.3 F (36.3 C)   Ht 5\' 1"  (1.549 m)   Wt 54 kg   SpO2 97%   BMI 22.48 kg/m    Subjective:   Patient ID: Norma Barajas, female    DOB: 04/25/1941, 79 y.o.   MRN: [id number]  HPI: Norma Barajas is a 79 y.o. female presenting on 07/31/2020 for Back Pain and burning with urination   Patient presented to the clinic for evaluation of left lower back pain. Patient states the pain is located in her lower back and has been causing her issues for about 2 months. She denies any trauma or injury. Patient also states within the last couple weeks she has had some burning with urination. Patient rates the pain 10/10 but states she can get the pain to a 5/10 with Tylenol and a heating pad. She denies fever, fatigue, blood in her urine, or pain radiation.  Back Pain The current episode started more than 1 month ago. The problem has been gradually worsening since onset. Associated symptoms include abdominal pain and dysuria. Pertinent negatives include no chest pain, fever, headaches, numbness or weakness.   Relevant past medical, surgical, family and social history reviewed and updated as indicated. Interim medical history since our last visit reviewed. Allergies and medications reviewed and updated.  Review of Systems  Constitutional: Negative for chills, fatigue and fever.  Respiratory: Negative for cough and shortness of breath.   Cardiovascular: Negative for chest pain, palpitations and leg swelling.  Gastrointestinal: Positive for abdominal pain. Negative for diarrhea, nausea and vomiting.  Genitourinary: Positive for difficulty urinating and dysuria. Negative for hematuria.  Musculoskeletal: Positive for back pain.  Skin: Negative for rash.  Neurological: Negative for weakness, numbness and headaches.    Per HPI unless specifically indicated above   Allergies as of 07/31/2020      Reactions   Codeine Nausea And Vomiting   Doxycycline Other (See  Comments)   Chest congestion   Triamterene-hctz Other (See Comments)   weakness   Atorvastatin Other (See Comments)   Myalgias   Crestor [rosuvastatin Calcium] Other (See Comments)   weakness   Morphine Nausea Only   Risedronate Sodium Other (See Comments)   ACTONEL - reflux      Medication List       Accurate as of July 31, 2020  2:00 PM. If you have any questions, ask your nurse or doctor.        STOP taking these medications   hyoscyamine 0.125 MG SL tablet Commonly known as: LEVSIN SL Stopped by: Fransisca Kaufmann Cailynn Bodnar, MD     TAKE these medications   acetaminophen 500 MG tablet Commonly known as: TYLENOL Take 1,000 mg by mouth 2 (two) times daily.   amiodarone 200 MG tablet Commonly known as: PACERONE Take 1 tablet (200 mg total) by mouth daily.   CALCIUM-VITAMIN D3 PO Take 1 tablet by mouth daily.   cholecalciferol 25 MCG (1000 UNIT) tablet Commonly known as: VITAMIN D3 Take 1,000 Units by mouth daily.   Fish Oil 1000 MG Caps Take 1,000 mg by mouth 2 (two) times daily.   furosemide 40 MG tablet Commonly known as: Lasix Take 1 tablet (40 mg total) by mouth daily.   loratadine 10 MG tablet Commonly known as: CLARITIN Take 10 mg by mouth daily as needed for allergies.   metoprolol tartrate 50 MG tablet Commonly known as: LOPRESSOR Take 1 tablet (50 mg  total) by mouth 2 (two) times daily.   multivitamin capsule Take 1 capsule by mouth every evening.   omeprazole 20 MG capsule Commonly known as: PRILOSEC Take 1 capsule (20 mg total) by mouth daily.   potassium chloride 10 MEQ tablet Commonly known as: KLOR-CON Take 3 tablets (30 mEq total) by mouth daily with breakfast.   simvastatin 20 MG tablet Commonly known as: ZOCOR Take 1 tablet (20 mg total) by mouth daily.   warfarin 3 MG tablet Commonly known as: COUMADIN Take as directed by the anticoagulation clinic. If you are unsure how to take this medication, talk to your nurse or  doctor. Original instructions: TAKE 1/2 TO 1 TABLET EVERY DAY AS DIRECTED BY  ANTICOAGULATION CLINIC        Objective:   BP (!) 146/108   Pulse (!) 104   Temp (!) 97.3 F (36.3 C)   Ht 5\' 1"  (1.549 m)   Wt 54 kg   SpO2 97%   BMI 22.48 kg/m   Wt Readings from Last 3 Encounters:  07/31/20 54 kg  07/16/20 55.3 kg  07/15/20 55.2 kg    Physical Exam Constitutional:      General: She is not in acute distress.    Appearance: Normal appearance.  Cardiovascular:     Rate and Rhythm: Rhythm irregular.  Pulmonary:     Effort: Pulmonary effort is normal.     Breath sounds: Normal breath sounds.  Abdominal:     Palpations: Abdomen is soft.     Tenderness: There is abdominal tenderness. There is left CVA tenderness.  Musculoskeletal:        General: Tenderness present.     Comments: Left lower back   Skin:    General: Skin is warm and dry.     Findings: No bruising, erythema, lesion or rash.  Neurological:     Mental Status: She is alert and oriented to person, place, and time.     Motor: No weakness.     Gait: Gait normal.     Results for orders placed or performed in visit on 07/31/20  Urine Culture   Specimen: Urine   UR  Result Value Ref Range   Urine Culture, Routine Final report    Organism ID, Bacteria No growth   Microscopic Examination   Urine  Result Value Ref Range   WBC, UA None seen 0 - 5 /hpf   RBC None seen 0 - 2 /hpf   Epithelial Cells (non renal) 0-10 0 - 10 /hpf   Bacteria, UA None seen None seen/Few  Urinalysis, Complete  Result Value Ref Range   Specific Gravity, UA 1.020 1.005 - 1.030   pH, UA 5.0 5.0 - 7.5   Color, UA Yellow Yellow   Appearance Ur Clear Clear   Leukocytes,UA Negative Negative   Protein,UA Trace (A) Negative/Trace   Glucose, UA Negative Negative   Ketones, UA Negative Negative   RBC, UA Negative Negative   Bilirubin, UA Negative Negative   Urobilinogen, Ur 0.2 0.2 - 1.0 mg/dL   Nitrite, UA Negative Negative    Microscopic Examination See below:     Assessment & Plan:   Problem List Items Addressed This Visit    None    Visit Diagnoses    UTI symptoms    -  Primary   Left low back pain, unspecified chronicity, unspecified whether sciatica present       Relevant Orders   Urine Culture (Completed)   Urinalysis, Complete (Completed)  Burning with urination       Relevant Orders   Urine Culture (Completed)   Urinalysis, Complete (Completed)      Plan: Patient's condition was discussed with him and the following plan was determined:    1. We will start Ciprofloxacin 500mg . Please take one tablet by mouth two times a day    2. Continue taking tylenol and using heating pad as needed for pain relief   3. We will collect urine sample for definitive culture for UTI.    Plan was discussed with Dr. Caryl Pina, MD who is in agreement with stated plan.    Maxie Better PA-S    Follow up plan: Return if symptoms worsen or fail to improve.  Counseling provided for all of the vaccine components Orders Placed This Encounter  Procedures  . Urine Culture  . Urinalysis, Complete   Patient seen and examined with Maxie Better, PA student.,  Agree with assessment and plan above.  We will treat like possible UTI and then if does not improve will reassess for her back in the future. Caryl Pina, MD [facility] 07/31/2020, 2:00 PM

## 2020-08-02 LAB — URINE CULTURE: Organism ID, Bacteria: NO GROWTH

## 2020-08-04 ENCOUNTER — Telehealth: Payer: Self-pay | Admitting: Family Medicine

## 2020-08-04 NOTE — Telephone Encounter (Signed)
Urine did come back negative but I was still hoping that the antibiotic may be treating something that did not come back positive, if she has not improved at all then we may need to consider imaging of her back, if it does not improve then we may need to have her back in for a visit to consider MRI in the future.  In the meantime have her take Tylenol and finish the antibiotic and let me know if it does not get better or not

## 2020-08-04 NOTE — Telephone Encounter (Signed)
Pts husband called stating that pt saw Dr Dettinger this past Friday regarding lower back pain. Husband says her urine came back normal but says pts back is still in a lot of pain. Wants to know if there is anything that can be sent in for pt to help relieve the back pain or anything that can be advised for pt to do.

## 2020-08-04 NOTE — Telephone Encounter (Signed)
Patient and husband aware

## 2020-08-05 ENCOUNTER — Other Ambulatory Visit: Payer: Self-pay

## 2020-08-05 ENCOUNTER — Other Ambulatory Visit: Payer: Medicare HMO

## 2020-08-05 ENCOUNTER — Telehealth: Payer: Self-pay | Admitting: Family Medicine

## 2020-08-05 DIAGNOSIS — E782 Mixed hyperlipidemia: Secondary | ICD-10-CM

## 2020-08-05 DIAGNOSIS — I10 Essential (primary) hypertension: Secondary | ICD-10-CM

## 2020-08-05 DIAGNOSIS — I4891 Unspecified atrial fibrillation: Secondary | ICD-10-CM | POA: Diagnosis not present

## 2020-08-05 MED ORDER — METHOCARBAMOL 500 MG PO TABS: 500.0000 mg | ORAL_TABLET | Freq: Three times a day (TID) | ORAL | 0 refills | Status: DC | PRN

## 2020-08-05 NOTE — Telephone Encounter (Signed)
Patient states she takes abx with food, is taking tylenol BID, using heating pad and using muscle rub.

## 2020-08-05 NOTE — Telephone Encounter (Signed)
Is she taking the antibiotic with food? Is she taking Tylenol 2-3 times per day as directed? Has she tried a heating pad or muscle rub such as Biofreeze or [place] gel?

## 2020-08-05 NOTE — Telephone Encounter (Signed)
Lmtcb.

## 2020-08-05 NOTE — Telephone Encounter (Signed)
I sent a muscle relaxer to [facility] for her to try. Her urine did not grow any bacteria if she would like to stop the antibiotic. I will see her on Friday and if we need to do some imaging we can order it then.

## 2020-08-05 NOTE — Telephone Encounter (Signed)
Patient had an appointment with Dr. Warrick Parisian on 9/23 and was given Cipro.  Stating the medication is giving her n&v.  Patient states she thinks she does not have a UTI and that she is just having back spasms. Please advise - Dettinger is OFF today

## 2020-08-06 LAB — CBC WITH DIFFERENTIAL/PLATELET
Basophils Absolute: 0.1 10*3/uL (ref 0.0–0.2)
Basos: 1 %
EOS (ABSOLUTE): 0.1 10*3/uL (ref 0.0–0.4)
Eos: 1 %
Hematocrit: 48.3 % — ABNORMAL HIGH (ref 34.0–46.6)
Hemoglobin: 15.9 g/dL (ref 11.1–15.9)
Immature Grans (Abs): 0 10*3/uL (ref 0.0–0.1)
Immature Granulocytes: 0 %
Lymphocytes Absolute: 2 10*3/uL (ref 0.7–3.1)
Lymphs: 35 %
MCH: 30.3 pg (ref 26.6–33.0)
MCHC: 32.9 g/dL (ref 31.5–35.7)
MCV: 92 fL (ref 79–97)
Monocytes Absolute: 0.5 10*3/uL (ref 0.1–0.9)
Monocytes: 9 %
Neutrophils Absolute: 3 10*3/uL (ref 1.4–7.0)
Neutrophils: 54 %
Platelets: 150 10*3/uL (ref 150–450)
RBC: 5.24 x10E6/uL (ref 3.77–5.28)
RDW: 14.7 % (ref 11.7–15.4)
WBC: 5.6 10*3/uL (ref 3.4–10.8)

## 2020-08-06 LAB — CMP14+EGFR
ALT: 17 IU/L (ref 0–32)
AST: 23 IU/L (ref 0–40)
Albumin/Globulin Ratio: 1.9 (ref 1.2–2.2)
Albumin: 4.4 g/dL (ref 3.6–4.6)
Alkaline Phosphatase: 84 IU/L (ref 44–121)
BUN/Creatinine Ratio: 12 (ref 12–28)
BUN: 11 mg/dL (ref 8–27)
Bilirubin Total: 0.4 mg/dL (ref 0.0–1.2)
CO2: 25 mmol/L (ref 20–29)
Calcium: 9.1 mg/dL (ref 8.7–10.3)
Chloride: 102 mmol/L (ref 96–106)
Creatinine, Ser: 0.94 mg/dL (ref 0.57–1.00)
GFR calc Af Amer: 66 mL/min/{1.73_m2} (ref >59)
GFR calc non Af Amer: 57 mL/min/{1.73_m2} — ABNORMAL LOW (ref >59)
Globulin, Total: 2.3 g/dL (ref 1.5–4.5)
Glucose: 111 mg/dL — ABNORMAL HIGH (ref 65–99)
Potassium: 3.7 mmol/L (ref 3.5–5.2)
Sodium: 144 mmol/L (ref 134–144)
Total Protein: 6.7 g/dL (ref 6.0–8.5)

## 2020-08-06 LAB — LIPID PANEL
Chol/HDL Ratio: 3.4 ratio (ref 0.0–4.4)
Cholesterol, Total: 132 mg/dL (ref 100–199)
HDL: 39 mg/dL — ABNORMAL LOW (ref >39)
LDL Chol Calc (NIH): 70 mg/dL (ref 0–99)
Triglycerides: 128 mg/dL (ref 0–149)
VLDL Cholesterol Cal: 23 mg/dL (ref 5–40)

## 2020-08-06 NOTE — Telephone Encounter (Signed)
Pt aware, she has stopped ABT and will pick up muscle relaxer today

## 2020-08-08 ENCOUNTER — Encounter: Payer: Self-pay | Admitting: Family Medicine

## 2020-08-08 ENCOUNTER — Other Ambulatory Visit: Payer: Self-pay

## 2020-08-08 ENCOUNTER — Ambulatory Visit (INDEPENDENT_AMBULATORY_CARE_PROVIDER_SITE_OTHER): Payer: Medicare HMO

## 2020-08-08 ENCOUNTER — Ambulatory Visit (INDEPENDENT_AMBULATORY_CARE_PROVIDER_SITE_OTHER): Payer: Medicare HMO | Admitting: Family Medicine

## 2020-08-08 ENCOUNTER — Ambulatory Visit: Payer: Medicare HMO

## 2020-08-08 VITALS — BP 153/104 | HR 114 | Temp 97.0°F | Ht 61.0 in | Wt 118.6 lb

## 2020-08-08 DIAGNOSIS — I4891 Unspecified atrial fibrillation: Secondary | ICD-10-CM | POA: Diagnosis not present

## 2020-08-08 DIAGNOSIS — I2510 Atherosclerotic heart disease of native coronary artery without angina pectoris: Secondary | ICD-10-CM

## 2020-08-08 DIAGNOSIS — I10 Essential (primary) hypertension: Secondary | ICD-10-CM | POA: Diagnosis not present

## 2020-08-08 DIAGNOSIS — M546 Pain in thoracic spine: Secondary | ICD-10-CM

## 2020-08-08 DIAGNOSIS — E782 Mixed hyperlipidemia: Secondary | ICD-10-CM

## 2020-08-08 DIAGNOSIS — K219 Gastro-esophageal reflux disease without esophagitis: Secondary | ICD-10-CM

## 2020-08-08 DIAGNOSIS — E559 Vitamin D deficiency, unspecified: Secondary | ICD-10-CM

## 2020-08-08 DIAGNOSIS — F411 Generalized anxiety disorder: Secondary | ICD-10-CM | POA: Diagnosis not present

## 2020-08-08 DIAGNOSIS — M810 Age-related osteoporosis without current pathological fracture: Secondary | ICD-10-CM | POA: Diagnosis not present

## 2020-08-08 DIAGNOSIS — Z7901 Long term (current) use of anticoagulants: Secondary | ICD-10-CM

## 2020-08-08 DIAGNOSIS — R1011 Right upper quadrant pain: Secondary | ICD-10-CM

## 2020-08-08 LAB — COAGUCHEK XS/INR WAIVED
INR: 4.8 — ABNORMAL HIGH (ref 0.9–1.1)
Prothrombin Time: 58 s

## 2020-08-08 MED ORDER — PREDNISONE 10 MG (21) PO TBPK: ORAL_TABLET | ORAL | 0 refills | Status: DC

## 2020-08-08 NOTE — Progress Notes (Signed)
Assessment & Plan:  1-2. Atrial fibrillation, unspecified type (HCC)/Chronic anticoagulation Description   Hold warfarin x2 days, then resume warfarin 1.5 mg on Tuesday and 3 mg on all other days.   INR was 4.8 today (goal is 2.0 to 3.0) - Recent use of Cipro. Next INR in 1.5 weeks.      - CoaguChek XS/INR Waived  3. Essential hypertension - Elevated today, but patient is in pain. Normally well controlled.   4. Atherosclerosis of native coronary artery of native heart without angina pectoris - Patient is on statin therapy.   5. Mixed hyperlipidemia - Well controlled on current regimen.   6. Gastroesophageal reflux disease, unspecified whether esophagitis present - Well controlled on current regimen.   7. Age-related osteoporosis without current pathological fracture - Patient declines repeat DEXA scan today. She will continue calcium and vitamin D.   8. Vitamin D deficiency - Well controlled on current regimen.   9. GAD (generalized anxiety disorder) - Patient declines treatment.   10. Acute bilateral thoracic back pain - DG Thoracic Spine 2 View - predniSONE (STERAPRED UNI-PAK 21 TAB) 10 MG (21) TBPK tablet; As directed x 6 days  Dispense: 21 tablet; Refill: 0  11. Right upper quadrant abdominal pain - Encouraged patient to call back to GI office and give update as requested of her so that she can get scheduled for EGD.    Return in about 10 days (around 08/18/2020) for INR.  [name], MSN, APRN, FNP-C [facility]  Subjective:    Patient ID: Norma Barajas, female    DOB: Aug 18, 1941, 79 y.o.   MRN: [id number]  Patient Care Team: Loman Brooklyn, FNP as PCP - General (Family Medicine) Minus Breeding, MD as PCP - Cardiology (Cardiology) [name], MD (Inactive) as Attending Physician (Gastroenterology) Melina Schools, OD as Consulting Physician (Optometry) Sherran Needs, NP as Consulting Physician (Cardiology)   Chief  Complaint:  Chief Complaint  Patient presents with  . Coagulation Disorder  . Back Pain    ongoing- patient states it is not any better  . Abdominal Pain    She has been having ongoing pain where her gallbladder is even though it has been removed.    HPI: Norma Barajas is a 79 y.o. female presenting on 08/08/2020 for Coagulation Disorder, Back Pain (ongoing- patient states it is not any better), and Abdominal Pain (She has been having ongoing pain where her gallbladder is even though it has been removed.)  Anticoagulation: Patient here for anticoagulation monitoring. Indication: atrial fibrillation Bleeding Signs/Symptoms:  None Thromboembolic Signs/Symptoms:  None  Missed Coumadin Doses:  None Medication Changes:  yes - patient was recently taking Cipro Dietary Changes:  no Bacterial/Viral Infection:  no  Depression/Anxiety: patient does have some anxiety and depression (especially since the passing of her son), but does not desire medication to treat.  Depression screen [facility] 2/9 08/08/2020 08/08/2020 07/31/2020  Decreased Interest 2 0 0  Down, Depressed, Hopeless 0 0 0  PHQ - 2 Score 2 0 0  Altered sleeping 0 - -  Tired, decreased energy 3 - -  Change in appetite 3 - -  Feeling bad or failure about yourself  0 - -  Trouble concentrating 0 - -  Moving slowly or fidgety/restless 0 - -  Suicidal thoughts 0 - -  PHQ-9 Score 8 - -  Difficult doing work/chores - - -  Some recent data might be hidden   GAD 7 :  Generalized Anxiety Score 08/08/2020 05/03/2019 04/22/2016  Nervous, Anxious, on Edge 3 3 1   Control/stop worrying 3 - 1  Worry too much - different things 3 3 1   Trouble relaxing 3 - 0  Restless 3 0 0  Easily annoyed or irritable 3 3 0  Afraid - awful might happen 0 3 0  Total GAD 7 Score 18 - 3  Anxiety Difficulty - Very difficult Not difficult at all    Hypertension: patient's BP is elevated today, but she is in pain.   New complaints: Patient c/o back pain. She was  initially treated with Cipro for suspected UTI. Her urine culture grew no bacteria and she was having side effects of the medication so she was advised she could stop it. She has been taking Robaxin for 2 days and feels it was initially helpful but now she doesn't feel like it is. She has also been taking Tylenol for pain. She describes the pain as aching and rates it 10/10. Moving her arms makes the pain worse.   Patient continues to have abdominal pain. She saw GI on 07/15/2020 at which time she was given a prescription for hyoscyamine BID PRN. She was asked to call back in 10-14 days to report how her symptoms were doing. If she continued to have symptoms, she was to be scheduled for an EGD. Patient has not called back to GI to give an update, but reports to me that the medication did not help.    Social history:  Relevant past medical, surgical, family and social history reviewed and updated as indicated. Interim medical history since our last visit reviewed.  Allergies and medications reviewed and updated.  DATA REVIEWED: CHART IN EPIC  ROS: Negative unless specifically indicated above in HPI.    Current Outpatient Medications:  .  acetaminophen (TYLENOL) 500 MG tablet, Take 1,000 mg by mouth 2 (two) times daily. , Disp: , Rfl:  .  amiodarone (PACERONE) 200 MG tablet, Take 1 tablet (200 mg total) by mouth daily., Disp: 90 tablet, Rfl: 1 .  Calcium Carbonate-Vitamin D (CALCIUM-VITAMIN D3 PO), Take 1 tablet by mouth daily., Disp: , Rfl:  .  cholecalciferol (VITAMIN D3) 25 MCG (1000 UT) tablet, Take 1,000 Units by mouth daily., Disp: , Rfl:  .  furosemide (LASIX) 40 MG tablet, Take 1 tablet (40 mg total) by mouth daily., Disp: 90 tablet, Rfl: 2 .  loratadine (CLARITIN) 10 MG tablet, Take 10 mg by mouth daily as needed for allergies., Disp: , Rfl:  .  methocarbamol (ROBAXIN) 500 MG tablet, Take 1 tablet (500 mg total) by mouth 3 (three) times daily as needed for muscle spasms., Disp: 30 tablet,  Rfl: 0 .  metoprolol tartrate (LOPRESSOR) 50 MG tablet, Take 1 tablet (50 mg total) by mouth 2 (two) times daily., Disp: 180 tablet, Rfl: 3 .  Multiple Vitamin (MULTIVITAMIN) capsule, Take 1 capsule by mouth every evening., Disp: , Rfl:  .  Omega-3 Fatty Acids (FISH OIL) 1000 MG CAPS, Take 1,000 mg by mouth 2 (two) times daily. , Disp: , Rfl:  .  omeprazole (PRILOSEC) 20 MG capsule, Take 1 capsule (20 mg total) by mouth daily., Disp: 90 capsule, Rfl: 3 .  potassium chloride (KLOR-CON) 10 MEQ tablet, Take 3 tablets (30 mEq total) by mouth daily with breakfast., Disp: 60 tablet, Rfl: 3 .  simvastatin (ZOCOR) 20 MG tablet, Take 1 tablet (20 mg total) by mouth daily., Disp: 90 tablet, Rfl: 1 .  warfarin (COUMADIN) 3 MG  tablet, TAKE 1/2 TO 1 TABLET EVERY DAY AS DIRECTED BY  ANTICOAGULATION CLINIC, Disp: 90 tablet, Rfl: 0   Allergies  Allergen Reactions  . Codeine Nausea And Vomiting  . Doxycycline Other (See Comments)    Chest congestion  . Triamterene-Hctz Other (See Comments)    weakness  . Atorvastatin Other (See Comments)    Myalgias   . Crestor [Rosuvastatin Calcium] Other (See Comments)    weakness  . Morphine Nausea Only  . Risedronate Sodium Other (See Comments)    ACTONEL - reflux   Past Medical History:  Diagnosis Date  . 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 Anxiety   . Arthritis    "right leg" (04/11/2015)  . BPPV (benign paroxysmal positional vertigo) 03/23/2016  . Cataract   . Coronary atherosclerosis of native coronary artery    a. Nonobstructive minimal CAD 10/2005.  Marland Kitchen Depression   . Diastolic dysfunction    Grade 1. Ejection fraction 60-65%.  Marland Kitchen Dysrhythmia    a fib  . Erosive esophagitis   . Essential hypertension   . GERD (gastroesophageal reflux disease)   . Grade III hemorrhoids   . History of hiatal hernia   . Hypercholesterolemia   . Internal hemorrhoids with complication 2/97/9892   OCT 2015 FLEX SIG/IH BANDING    . Migraine    "used to have  them right bad; I don't now" (04/11/2015)  . MITRAL REGURGITATION 04/27/2010   Qualifier: Diagnosis of  By: [name], MD, [name]   . Osteoporosis   . Paroxysmal atrial fibrillation (HCC)   . PONV (postoperative nausea and vomiting)   . Vertigo     Past Surgical History:  Procedure Laterality Date  . BRAVO [place] STUDY  11/15/2012   Procedure: BRAVO [place];  Surgeon: [name], MD;  Location: [facility] ENDO SUITE;  Service: Endoscopy;;  . CARDIOVERSION N/A 05/16/2020   Procedure: CARDIOVERSION;  Surgeon: Donato Heinz, MD;  Location: [facility] ENDOSCOPY;  Service: Cardiovascular;  Laterality: N/A;  . CATARACT EXTRACTION W/ INTRAOCULAR LENS  IMPLANT, BILATERAL Bilateral   . COLONOSCOPY  2008   Dr. [name]: internal hemorrhoids   . DILATION AND CURETTAGE OF UTERUS    . ESOPHAGOGASTRODUODENOSCOPY (EGD) WITH ESOPHAGEAL DILATION  2001   Dr. [name]: erosive esophagitis, esophageal stricture, duodenitis, s/p Savary dilation  . ESOPHAGOGASTRODUODENOSCOPY (EGD) WITH ESOPHAGEAL DILATION  11/15/2012   JJH:ERDEYCXKGY web was found & MOST LIKELY CAUSE FOR DYAPHAGIA/Polyp was found in the gastric body and gastric fundus/ gastritis on bx  . EYE SURGERY Bilateral    "laser OR after cataract OR; cause I couldn't see"  . FLEXIBLE SIGMOIDOSCOPY N/A 08/22/2014   mild diverticulosis in sigmoid, moderate sized Grade 3 hemorrhoids s/p banding X 3.   Marland Kitchen FRACTURE SURGERY Right    below the knee - 2 bones broke has plates  . HEMORRHOID BANDING N/A 08/22/2014   Procedure: HEMORRHOID BANDING;  Surgeon: [name], MD;  Location: [facility] ENDO SUITE;  Service: Endoscopy;  Laterality: N/A;  . HEMORRHOID SURGERY N/A 03/30/2018   Procedure: EXTENSIVE HEMORRHOIDECTOMY;  Surgeon: [name], MD;  Location: [facility] ORS;  Service: General;  Laterality: N/A;  . OPEN REDUCTION INTERNAL FIXATION (ORIF) TIBIA/FIBULA FRACTURE Right 2013   broke tibia and fibula after falling down stairs  . PROLAPSED UTERINE FIBROID LIGATION   2015  . TOTAL ABDOMINAL HYSTERECTOMY      Social History   Socioeconomic History  . Marital status: Married    Spouse  name: george   . Number of children: 1  . Years of education: Not on file  . Highest education level: Not on file  Occupational History  . Occupation: Retired    Comment: Textile  Tobacco Use  . Smoking status: Never Smoker  . Smokeless tobacco: Never Used  Vaping Use  . Vaping Use: Never used  Substance and Sexual Activity  . Alcohol use: No    Alcohol/week: 0.0 standard drinks  . Drug use: No  . Sexual activity: Not Currently    Birth control/protection: Surgical, Post-menopausal    Comment: hyst  Other Topics Concern  . Not on file  Social History Narrative   Married   No regular exercise   Social Determinants of Health   Financial Resource Strain:   . Difficulty of Paying Living Expenses: Not on file  Food Insecurity:   . Worried About Charity fundraiser in the Last Year: Not on file  . Ran Out of Food in the Last Year: Not on file  Transportation Needs:   . Lack of Transportation (Medical): Not on file  . Lack of Transportation (Non-Medical): Not on file  Physical Activity:   . Days of Exercise per Week: Not on file  . Minutes of Exercise per Session: Not on file  Stress:   . Feeling of Stress : Not on file  Social Connections:   . Frequency of Communication with Friends and Family: Not on file  . Frequency of Social Gatherings with Friends and Family: Not on file  . Attends Religious Services: Not on file  . Active Member of Clubs or Organizations: Not on file  . Attends Archivist Meetings: Not on file  . Marital Status: Not on file  Intimate Partner Violence:   . Fear of Current or Ex-Partner: Not on file  . Emotionally Abused: Not on file  . Physically Abused: Not on file  . Sexually Abused: Not on file        Objective:    BP (!) 153/104   Pulse (!) 114   Temp (!) 97 F (36.1 C) (Temporal)   Ht 5\' 1"  (1.549 m)    Wt 118 lb 9.6 oz (53.8 kg)   SpO2 90%   BMI 22.41 kg/m   Wt Readings from Last 3 Encounters:  08/08/20 118 lb 9.6 oz (53.8 kg)  07/31/20 119 lb (54 kg)  07/16/20 122 lb (55.3 kg)    Physical Exam Vitals reviewed.  Constitutional:      General: She is not in acute distress.    Appearance: Normal appearance. She is normal weight. She is not ill-appearing, toxic-appearing or diaphoretic.  HENT:     Head: Normocephalic and atraumatic.  Eyes:     General: No scleral icterus.       Right eye: No discharge.        Left eye: No discharge.     Conjunctiva/sclera: Conjunctivae normal.  Cardiovascular:     Rate and Rhythm: Normal rate. Rhythm irregular.     Heart sounds: Normal heart sounds. No murmur heard.  No friction rub. No gallop.   Pulmonary:     Effort: Pulmonary effort is normal. No respiratory distress.     Breath sounds: Normal breath sounds. No stridor. No wheezing, rhonchi or rales.  Musculoskeletal:        General: Normal range of motion.     Cervical back: Normal range of motion.     Thoracic back: Tenderness (between ribs) present. No  swelling, edema, deformity, signs of trauma, lacerations, spasms or bony tenderness. Normal range of motion.  Skin:    General: Skin is warm and dry.     Capillary Refill: Capillary refill takes less than 2 seconds.  Neurological:     General: No focal deficit present.     Mental Status: She is alert and oriented to person, place, and time. Mental status is at baseline.  Psychiatric:        Mood and Affect: Mood normal.        Behavior: Behavior normal.        Thought Content: Thought content normal.        Judgment: Judgment normal.    Lab Results  Component Value Date   TSH 0.805 05/20/2020   Lab Results  Component Value Date   WBC 5.6 08/05/2020   HGB 15.9 08/05/2020   HCT 48.3 (H) 08/05/2020   MCV 92 08/05/2020   PLT 150 08/05/2020   Lab Results  Component Value Date   NA 144 08/05/2020   K 3.7 08/05/2020   CO2 25  08/05/2020   GLUCOSE 111 (H) 08/05/2020   BUN 11 08/05/2020   CREATININE 0.94 08/05/2020   BILITOT 0.4 08/05/2020   ALKPHOS 84 08/05/2020   AST 23 08/05/2020   ALT 17 08/05/2020   PROT 6.7 08/05/2020   ALBUMIN 4.4 08/05/2020   CALCIUM 9.1 08/05/2020   ANIONGAP 15 05/20/2020   Lab Results  Component Value Date   CHOL 132 08/05/2020   Lab Results  Component Value Date   HDL 39 (L) 08/05/2020   Lab Results  Component Value Date   LDLCALC 70 08/05/2020   Lab Results  Component Value Date   TRIG 128 08/05/2020   Lab Results  Component Value Date   CHOLHDL 3.4 08/05/2020   Lab Results  Component Value Date   HGBA1C 5.6 02/28/2017

## 2020-08-08 NOTE — Patient Instructions (Signed)
Call Dr. Laural Golden and give him an update as he requested.

## 2020-08-18 ENCOUNTER — Encounter: Payer: Self-pay | Admitting: Family Medicine

## 2020-08-18 ENCOUNTER — Other Ambulatory Visit: Payer: Self-pay

## 2020-08-18 ENCOUNTER — Ambulatory Visit (INDEPENDENT_AMBULATORY_CARE_PROVIDER_SITE_OTHER): Payer: Medicare HMO | Admitting: Family Medicine

## 2020-08-18 ENCOUNTER — Ambulatory Visit (INDEPENDENT_AMBULATORY_CARE_PROVIDER_SITE_OTHER): Payer: Medicare HMO

## 2020-08-18 ENCOUNTER — Telehealth: Payer: Self-pay

## 2020-08-18 VITALS — BP 140/95 | HR 124 | Temp 97.7°F | Ht 61.0 in | Wt 119.2 lb

## 2020-08-18 DIAGNOSIS — Z7901 Long term (current) use of anticoagulants: Secondary | ICD-10-CM | POA: Diagnosis not present

## 2020-08-18 DIAGNOSIS — M545 Low back pain, unspecified: Secondary | ICD-10-CM

## 2020-08-18 DIAGNOSIS — I4891 Unspecified atrial fibrillation: Secondary | ICD-10-CM

## 2020-08-18 DIAGNOSIS — R29898 Other symptoms and signs involving the musculoskeletal system: Secondary | ICD-10-CM | POA: Diagnosis not present

## 2020-08-18 LAB — COAGUCHEK XS/INR WAIVED
INR: 7.7 (ref 0.9–1.1)
Prothrombin Time: 92.1 s

## 2020-08-18 LAB — POCT INR: INR: 6.7 — AB (ref 2–3)

## 2020-08-18 MED ORDER — PHYTONADIONE 1 MG/0.5 ML ORAL SOLUTION: 5.0000 mg | Freq: Once | ORAL | 0 refills | Status: DC

## 2020-08-18 MED ORDER — PHYTONADIONE 1 MG/0.5 ML ORAL SOLUTION: 5.0000 mg | Freq: Once | ORAL | 0 refills | Status: AC

## 2020-08-18 NOTE — Telephone Encounter (Signed)
  Prescription Request  08/18/2020  What is the name of the medication or equipment? phytonadione (VITAMIN K) 2 MG/ML SOLN oral solution  Have you contacted your pharmacy to request a refill? (if applicable) [facility] have it in [place] [facility] you [facility]   Patient notified that their request is being sent to the clinical staff for review and that they should receive a response within 2 business days.

## 2020-08-18 NOTE — Progress Notes (Signed)
Subjective: CC: follow up for anticoagulation, a fib, leg weakness, back pain PCP: Loman Brooklyn, FNP  Norma Barajas:Norma Barajas is a 79 y.o. female presenting to clinic today for:  1. Chronic anticoagulation Current regimen: 1.5 mg on Tuesday, 3 mg all other days Last INR was 4.8 on 08/08/20, dose held for 2 days and then resumed above regimen.  Indication: atrial fibrillation Bleeding Signs/Symptoms:  None Thromboembolic Signs/Symptoms:  None  Missed Coumadin Doses:  None Medication Changes:  not since last visit, Was on Cipro on 07/31/20 Dietary Changes:  no, however admits to poor appetite in the mornings Bacterial/Viral Infection:  no  2. Atrial fibrillation Seen by cardiology on 07/16/20 for persistent a. Fib. She reports that she has been in A. Fib for a few months. She reports fatigue that is her baseline. Denies shortness of breath, chest pain, lower leg edema, weight gain, or orthopnea.   3. Weakness in legs Norma Barajas reports leg weakness over the last 6 days. She reports 2 falls over this time period where her "legs buckled" and she went to the floor. She denies dizziness, lightheadedness, syncope, chest pain, or shortness of breath. She does report that her legs feel numb sometimes this is baseline for her since fracturing her leg in 2013. She does report decreased appetite for a few months. She does not normally eat breakfast. She does eat a meal for lunch and dinner. She drinks water as her main fluid intake, but reports that she probably does not drink as much as she should.   4. Low back pain Darl reports low back pain on left side that started 2 months ago. Describes pain as feeling like "something just grabs me in there". Pain waxes and wanes and is a 8/10 when it occurs. The pain does not radiate. She has tried a muscle relaxer without relief. Tylenol relieves pain for a short period of time. She reports a few falls over the past year, but denies one prior to the onset of this back  pain.    Relevant past medical, surgical, family, and social history reviewed and updated as indicated.  Allergies and medications reviewed and updated.  Allergies  Allergen Reactions  . Codeine Nausea And Vomiting  . Doxycycline Other (See Comments)    Chest congestion  . Triamterene-Hctz Other (See Comments)    weakness  . Atorvastatin Other (See Comments)    Myalgias   . Crestor [Rosuvastatin Calcium] Other (See Comments)    weakness  . Morphine Nausea Only  . Risedronate Sodium Other (See Comments)    ACTONEL - reflux   Past Medical History:  Diagnosis Date  . 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 Anxiety   . Arthritis    "right leg" (04/11/2015)  . BPPV (benign paroxysmal positional vertigo) 03/23/2016  . Cataract   . Coronary atherosclerosis of native coronary artery    a. Nonobstructive minimal CAD 10/2005.  Marland Kitchen Depression   . Diastolic dysfunction    Grade 1. Ejection fraction 60-65%.  Marland Kitchen Dysrhythmia    a fib  . Erosive esophagitis   . Essential hypertension   . GERD (gastroesophageal reflux disease)   . Grade III hemorrhoids   . History of hiatal hernia   . Hypercholesterolemia   . Internal hemorrhoids with complication 9/39/0300   OCT 2015 FLEX SIG/IH BANDING    . Migraine    "used to have them right bad; I don't now" (04/11/2015)  . MITRAL REGURGITATION 04/27/2010  Qualifier: Diagnosis of  By: [name], MD, [name]   . Osteoporosis   . Paroxysmal atrial fibrillation (HCC)   . PONV (postoperative nausea and vomiting)   . Vertigo     Current Outpatient Medications:  .  acetaminophen (TYLENOL) 500 MG tablet, Take 1,000 mg by mouth 2 (two) times daily. , Disp: , Rfl:  .  amiodarone (PACERONE) 200 MG tablet, Take 1 tablet (200 mg total) by mouth daily., Disp: 90 tablet, Rfl: 1 .  Calcium Carbonate-Vitamin D (CALCIUM-VITAMIN D3 PO), Take 1 tablet by mouth daily., Disp: , Rfl:  .  cholecalciferol (VITAMIN D3) 25 MCG (1000 UT) tablet, Take  1,000 Units by mouth daily., Disp: , Rfl:  .  furosemide (LASIX) 40 MG tablet, Take 1 tablet (40 mg total) by mouth daily., Disp: 90 tablet, Rfl: 2 .  loratadine (CLARITIN) 10 MG tablet, Take 10 mg by mouth daily as needed for allergies., Disp: , Rfl:  .  methocarbamol (ROBAXIN) 500 MG tablet, Take 1 tablet (500 mg total) by mouth 3 (three) times daily as needed for muscle spasms., Disp: 30 tablet, Rfl: 0 .  metoprolol tartrate (LOPRESSOR) 50 MG tablet, Take 1 tablet (50 mg total) by mouth 2 (two) times daily., Disp: 180 tablet, Rfl: 3 .  Multiple Vitamin (MULTIVITAMIN) capsule, Take 1 capsule by mouth every evening., Disp: , Rfl:  .  Omega-3 Fatty Acids (FISH OIL) 1000 MG CAPS, Take 1,000 mg by mouth 2 (two) times daily. , Disp: , Rfl:  .  omeprazole (PRILOSEC) 20 MG capsule, Take 1 capsule (20 mg total) by mouth daily., Disp: 90 capsule, Rfl: 3 .  potassium chloride (KLOR-CON) 10 MEQ tablet, Take 3 tablets (30 mEq total) by mouth daily with breakfast., Disp: 60 tablet, Rfl: 3 .  simvastatin (ZOCOR) 20 MG tablet, Take 1 tablet (20 mg total) by mouth daily., Disp: 90 tablet, Rfl: 1 .  warfarin (COUMADIN) 3 MG tablet, TAKE 1/2 TO 1 TABLET EVERY DAY AS DIRECTED BY  ANTICOAGULATION CLINIC, Disp: 90 tablet, Rfl: 0 Social History   Socioeconomic History  . Marital status: Married    Spouse name: george   . Number of children: 1  . Years of education: Not on file  . Highest education level: Not on file  Occupational History  . Occupation: Retired    Comment: Textile  Tobacco Use  . Smoking status: Never Smoker  . Smokeless tobacco: Never Used  Vaping Use  . Vaping Use: Never used  Substance and Sexual Activity  . Alcohol use: No    Alcohol/week: 0.0 standard drinks  . Drug use: No  . Sexual activity: Not Currently    Birth control/protection: Surgical, Post-menopausal    Comment: hyst  Other Topics Concern  . Not on file  Social History Narrative   Married   No regular exercise    Social Determinants of Health   Financial Resource Strain:   . Difficulty of Paying Living Expenses: Not on file  Food Insecurity:   . Worried About Charity fundraiser in the Last Year: Not on file  . Ran Out of Food in the Last Year: Not on file  Transportation Needs:   . Lack of Transportation (Medical): Not on file  . Lack of Transportation (Non-Medical): Not on file  Physical Activity:   . Days of Exercise per Week: Not on file  . Minutes of Exercise per Session: Not on file  Stress:   . Feeling of Stress : Not on file  Social Connections:   . Frequency of Communication with Friends and Family: Not on file  . Frequency of Social Gatherings with Friends and Family: Not on file  . Attends Religious Services: Not on file  . Active Member of Clubs or Organizations: Not on file  . Attends Archivist Meetings: Not on file  . Marital Status: Not on file  Intimate Partner Violence:   . Fear of Current or Ex-Partner: Not on file  . Emotionally Abused: Not on file  . Physically Abused: Not on file  . Sexually Abused: Not on file   Family History  Problem Relation Age of Onset  . Colon cancer Mother 70  . Heart disease Mother   . Osteoporosis Mother   . Hip fracture Mother   . Stroke Sister   . Diabetes Sister   . Osteoporosis Sister   . Arthritis Sister   . Uterine cancer Sister   . Stroke Sister   . Heart disease Father   . Hyperlipidemia Brother   . Hypertension Brother   . Heart disease Brother   . Stroke Brother   . Heart disease Sister   . Dementia Sister   . Diabetes Son   . Stroke Son   . Heart attack Neg Hx     Review of Systems  Per HPI.  Objective: Office vital signs reviewed. BP (!) 140/95   Pulse (!) 124   Temp 97.7 F (36.5 C) (Temporal)   Ht $R'5\' 1"'Rw$  (1.549 m)   Wt 119 lb 4 oz (54.1 kg)   BMI 22.53 kg/m   Physical Examination:  Physical Exam Vitals and nursing note reviewed.  Constitutional:      General: She is not in acute  distress.    Appearance: She is normal weight. She is not ill-appearing or diaphoretic.  Neck:     Thyroid: No thyroid mass or thyromegaly.     Vascular: No carotid bruit.     Trachea: Trachea normal.     Comments: Pulsations visible on both sides of neck that correlate with heart rate while sitting upright  Cardiovascular:     Rate and Rhythm: Rhythm irregularly irregular.     Chest Wall: PMI is not displaced. No thrill.     Heart sounds: No murmur heard.   Pulmonary:     Effort: Pulmonary effort is normal. No respiratory distress.  Musculoskeletal:     Cervical back: Neck supple. No tenderness.     Lumbar back: No swelling, tenderness or bony tenderness.     Right lower leg: No swelling or tenderness. No edema.     Left lower leg: No swelling or tenderness. No edema.  Skin:    General: Skin is warm and dry.  Neurological:     General: No focal deficit present.     Mental Status: She is alert and oriented to person, place, and time.  Psychiatric:        Mood and Affect: Mood normal.        Behavior: Behavior normal.      Results for orders placed or performed in visit on 08/18/20  POCT INR  Result Value Ref Range   INR 6.7 (A) 2.0 - 3.0     Assessment/ Plan: Sylvia was seen today for atrial fibrillation.  Consultation with Dr. Livia Snellen at patient's bedside  Diagnoses and all orders for this visit:  Chronic anticoagulation INR 6.7 today. Venipunture sent for confirmation. CBC pending. Vitamin K 5 mg x 1 dose. Hold Coumadin  dose until Wednesday (total of 3 doses). Return on 08/20/20 for repeat INR. Handout given for diet and coumadin information.  -     CoaguChek XS/INR Waived -     POCT INR -     Protime- INR -     phytonadione (VITAMIN K) 2 MG/ML SOLN oral solution; Take 2.5 mLs (5 mg total) by mouth once for 1 dose.  Atrial fibrillation, unspecified type ([place]) ECG in office today. Reviewed and compared to previous ECG from 07/16/20- no significant changes. HR 105 on ECG  today with A. Fib noted. Neck pulsations visible while patient is sitting, BNP ordered to assess for fluid overload. Labs pending as below. Keep cardiology appointment on 09/24/20, sooner if needed.  -     EKG 12-Lead -     POCT INR -     CBC with Differential/Platelet -     CMP14+EGFR -     Pro b natriuretic peptide  Acute left-sided low back pain without sciatica Radiology report pending. Nelle preferred to leave following Xray. Will notify patient of radiology results.  -     DG Lumbar Spine 2-3 Views  Weakness of both lower extremities Labs pending and radiology report pending as below.  -     CBC with Differential/Platelet -     CMP14+EGFR -     DG Lumbar Spine 2-3 Views  Follow up on 08/20/20 for repeat INR.   The above assessment and management plan was discussed with the patient. The patient verbalized understanding of and has agreed to the management plan. Patient is aware to call the clinic if symptoms persist or worsen. Patient is aware when to return to the clinic for a follow-up visit. Patient educated on when it is appropriate to go to the emergency department.   Marjorie Smolder, FNP-C [facility] [address] [place], [place] [address] [phone]

## 2020-08-18 NOTE — Telephone Encounter (Signed)
received call from pharmacy about VIT K- they state they can not get this in and she does not know where she can get it from.  Please advise   Please let husband know

## 2020-08-18 NOTE — Patient Instructions (Signed)
Take 5 mg of Vitamin K today. Hold coumadin dose today, and tomorrow. Follow up of Wednesday 08/20/20 for repeat INR.     Vitamin K Foods and Warfarin Warfarin is a blood thinner (anticoagulant). Anticoagulant medicines help prevent the formation of blood clots. These medicines work by decreasing the activity of vitamin K, which promotes normal blood clotting. When you take warfarin, problems can occur from suddenly increasing or decreasing the amount of vitamin K that you eat from one day to the next. Problems may include:  Blood clots.  Bleeding. What general guidelines do I need to follow? To avoid problems when taking warfarin:  Eat a balanced diet that includes: ? Fresh fruits and vegetables. ? Whole grains. ? Low-fat dairy products. ? Lean proteins, such as fish, eggs, and lean cuts of meat.  Keep your intake of vitamin K consistent from day to day. To do this: ? Avoid eating large amounts of vitamin K one day and low amounts of vitamin K the next day. ? If you take a multivitamin that contains vitamin K, be sure to take it every day. ? Know which foods contain vitamin K. Use the lists below to understand serving sizes and the amount of vitamin K in one serving.  Avoid major changes in your diet. If you are going to change your diet, talk with your health care provider before making changes.  Work with a Financial planner (dietitian) to develop a meal plan that works best for you.  High vitamin K foods Foods that are high in vitamin K contain more than 100 mcg (micrograms) per serving. These include:  Broccoli (cooked) -  cup has 110 mcg.  Brussels sprouts (cooked) -  cup has 109 mcg.  Greens, beet (cooked) -  cup has 350 mcg.  Greens, collard (cooked) -  cup has 418 mcg.  Greens, turnip (cooked) -  cup has 265 mcg.  Green onions or scallions -  cup has 105 mcg.  Kale (fresh or frozen) -  cup has 531 mcg.  Parsley (raw) - 10 sprigs has 164  mcg.  Spinach (cooked) -  cup has 444 mcg.  Swiss chard (cooked) -  cup has 287 mcg. Moderate vitamin K foods Foods that have a moderate amount of vitamin K contain 25-100 mcg per serving. These include:  Asparagus (cooked) - 5 spears have 38 mcg.  Black-eyed peas (dried) -  cup has 32 mcg.  Cabbage (cooked) -  cup has 37 mcg.  Kiwi fruit - 1 medium has 31 mcg.  Lettuce - 1 cup has 57-63 mcg.  Okra (frozen) -  cup has 44 mcg.  Prunes (dried) - 5 prunes have 25 mcg.  Watercress (raw) - 1 cup has 85 mcg. Low vitamin K foods Foods low in vitamin K contain less than 25 mcg per serving. These include:  Artichoke - 1 medium has 18 mcg.  Avocado - 1 oz. has 6 mcg.  Blueberries -  cup has 14 mcg.  Cabbage (raw) -  cup has 21 mcg.  Carrots (cooked) -  cup has 11 mcg.  Cauliflower (raw) -  cup has 11 mcg.  Cucumber with peel (raw) -  cup has 9 mcg.  Grapes -  cup has 12 mcg.  Mango - 1 medium has 9 mcg.  Nuts - 1 oz. has 15 mcg.  Pear - 1 medium has 8 mcg.  Peas (cooked) -  cup has 19 mcg.  Pickles - 1 spear has 14 mcg.  Pumpkin  seeds - 1 oz. has 13 mcg.  Sauerkraut (canned) -  cup has 16 mcg.  Soybeans (cooked) -  cup has 16 mcg.  Tomato (raw) - 1 medium has 10 mcg.  Tomato sauce -  cup has 17 mcg. Vitamin K-free foods If a food contain less than 5 mcg per serving, it is considered to have no vitamin K. These foods include:  Bread and cereal products.  Cheese.  Eggs.  Fish and shellfish.  Meat and poultry.  Milk and dairy products.  Sunflower seeds. Actual amounts of vitamin K in foods may be different depending on processing. Talk with your dietitian about what foods you can eat and what foods you should avoid. This information is not intended to replace advice given to you by your health care provider. Make sure you discuss any questions you have with your health care provider. Document Revised: 10/07/2017 Document Reviewed:  01/28/2016 Elsevier Patient Education  2020 [employer].

## 2020-08-18 NOTE — Telephone Encounter (Signed)
Norma Barajas sent rx to [facility] and pt is aware.

## 2020-08-19 LAB — PROTIME-INR
INR: 6.1 (ref 0.9–1.2)
Prothrombin Time: 59.7 s — ABNORMAL HIGH (ref 9.1–12.0)

## 2020-08-19 LAB — CMP14+EGFR

## 2020-08-20 ENCOUNTER — Ambulatory Visit (INDEPENDENT_AMBULATORY_CARE_PROVIDER_SITE_OTHER): Payer: Medicare HMO | Admitting: Family Medicine

## 2020-08-20 ENCOUNTER — Encounter: Payer: Self-pay | Admitting: Family Medicine

## 2020-08-20 ENCOUNTER — Other Ambulatory Visit: Payer: Self-pay

## 2020-08-20 ENCOUNTER — Other Ambulatory Visit: Payer: Self-pay | Admitting: Family Medicine

## 2020-08-20 VITALS — BP 142/92 | HR 118 | Temp 98.0°F | Ht 61.0 in | Wt 120.5 lb

## 2020-08-20 DIAGNOSIS — I5031 Acute diastolic (congestive) heart failure: Secondary | ICD-10-CM | POA: Diagnosis not present

## 2020-08-20 DIAGNOSIS — I4891 Unspecified atrial fibrillation: Secondary | ICD-10-CM | POA: Diagnosis not present

## 2020-08-20 DIAGNOSIS — I10 Essential (primary) hypertension: Secondary | ICD-10-CM

## 2020-08-20 DIAGNOSIS — Z7901 Long term (current) use of anticoagulants: Secondary | ICD-10-CM

## 2020-08-20 DIAGNOSIS — M545 Low back pain, unspecified: Secondary | ICD-10-CM | POA: Diagnosis not present

## 2020-08-20 LAB — CBC WITH DIFFERENTIAL/PLATELET
Basophils Absolute: 0 10*3/uL (ref 0.0–0.2)
Basos: 0 %
EOS (ABSOLUTE): 0.1 10*3/uL (ref 0.0–0.4)
Eos: 1 %
Hematocrit: 48.5 % — ABNORMAL HIGH (ref 34.0–46.6)
Hemoglobin: 15.8 g/dL (ref 11.1–15.9)
Immature Grans (Abs): 0 10*3/uL (ref 0.0–0.1)
Immature Granulocytes: 0 %
Lymphocytes Absolute: 1.6 10*3/uL (ref 0.7–3.1)
Lymphs: 23 %
MCH: 30.3 pg (ref 26.6–33.0)
MCHC: 32.6 g/dL (ref 31.5–35.7)
MCV: 93 fL (ref 79–97)
Monocytes Absolute: 0.8 10*3/uL (ref 0.1–0.9)
Monocytes: 11 %
NRBC: 1 % — ABNORMAL HIGH (ref 0–0)
Neutrophils Absolute: 4.4 10*3/uL (ref 1.4–7.0)
Neutrophils: 65 %
Platelets: 143 10*3/uL — ABNORMAL LOW (ref 150–450)
RBC: 5.22 x10E6/uL (ref 3.77–5.28)
RDW: 15.6 % — ABNORMAL HIGH (ref 11.7–15.4)
WBC: 6.8 10*3/uL (ref 3.4–10.8)

## 2020-08-20 LAB — CMP14+EGFR

## 2020-08-20 LAB — COAGUCHEK XS/INR WAIVED
INR: 4.6 — ABNORMAL HIGH (ref 0.9–1.1)
Prothrombin Time: 54.9 s

## 2020-08-20 LAB — POCT INR: INR: 4.6 — AB (ref 2–3)

## 2020-08-20 LAB — PRO B NATRIURETIC PEPTIDE

## 2020-08-20 NOTE — Patient Instructions (Signed)
Atrial Fibrillation  Atrial fibrillation is a type of irregular or rapid heartbeat (arrhythmia). In atrial fibrillation, the top part of the heart (atria) beats in an irregular pattern. This makes the heart unable to pump blood normally and effectively. The goal of treatment is to prevent blood clots from forming, control your heart rate, or restore your heartbeat to a normal rhythm. If this condition is not treated, it can cause serious problems, such as a weakened heart muscle (cardiomyopathy) or a stroke. What are the causes? This condition is often caused by medical conditions that damage the heart's electrical system. These include:  High blood pressure (hypertension). This is the most common cause.  Certain heart problems or conditions, such as heart failure, coronary artery disease, heart valve problems, or heart surgery.  Diabetes.  Overactive thyroid (hyperthyroidism).  Obesity.  Chronic kidney disease. In some cases, the cause of this condition is not known. What increases the risk? This condition is more likely to develop in:  Older people.  People who smoke.  Athletes who do endurance exercise.  People who have a family history of atrial fibrillation.  Men.  People who use drugs.  People who drink a lot of alcohol.  People who have lung conditions, such as emphysema, pneumonia, or COPD.  People who have obstructive sleep apnea. What are the signs or symptoms? Symptoms of this condition include:  A feeling that your heart is racing or beating irregularly.  Discomfort or pain in your chest.  Shortness of breath.  Sudden light-headedness or weakness.  Tiring easily during exercise or activity.  Fatigue.  Syncope (fainting).  Sweating. In some cases, there are no symptoms. How is this diagnosed? Your health care provider may detect atrial fibrillation when taking your pulse. If detected, this condition may be diagnosed with:  An electrocardiogram  (ECG) to check electrical signals of the heart.  An ambulatory cardiac monitor to record your heart's activity for a few days.  A transthoracic echocardiogram (TTE) to create pictures of your heart.  A transesophageal echocardiogram (TEE) to create even closer pictures of your heart.  A stress test to check your blood supply while you exercise.  Imaging tests, such as a CT scan or chest X-ray.  Blood tests. How is this treated? Treatment depends on underlying conditions and how you feel when you experience atrial fibrillation. This condition may be treated with:  Medicines to prevent blood clots or to treat heart rate or heart rhythm problems.  Electrical cardioversion to reset the heart's rhythm.  A pacemaker to correct abnormal heart rhythm.  Ablation to remove the heart tissue that sends abnormal signals.  Left atrial appendage closure to seal the area where blood clots can form. In some cases, underlying conditions will be treated. Follow these instructions at home: Medicines  Take over-the counter and prescription medicines only as told by your health care provider.  Do not take any new medicines without talking to your health care provider.  If you are taking blood thinners: ? Talk with your health care provider before you take any medicines that contain aspirin or NSAIDs, such as ibuprofen. These medicines increase your risk for dangerous bleeding. ? Take your medicine exactly as told, at the same time every day. ? Avoid activities that could cause injury or bruising, and follow instructions about how to prevent falls. ? Wear a medical alert bracelet or carry a card that lists what medicines you take. Lifestyle      Do not use any products   that contain nicotine or tobacco, such as cigarettes, e-cigarettes, and chewing tobacco. If you need help quitting, ask your health care provider.  Eat heart-healthy foods. Talk with a dietitian to make an eating plan that is  right for you.  Exercise regularly as told by your health care provider.  Do not drink alcohol.  Lose weight if you are overweight.  Do not use drugs, including cannabis. General instructions  If you have obstructive sleep apnea, manage your condition as told by your health care provider.  Do not use diet pills unless your health care provider approves. Diet pills can make heart problems worse.  Keep all follow-up visits as told by your health care provider. This is important. Contact a health care provider if you:  Notice a change in the rate, rhythm, or strength of your heartbeat.  Are taking a blood thinner and you notice more bruising.  Tire more easily when you exercise or do heavy work.  Have a sudden change in weight. Get help right away if you have:   Chest pain, abdominal pain, sweating, or weakness.  Trouble breathing.  Side effects of blood thinners, such as blood in your vomit, stool, or urine, or bleeding that cannot stop.  Any symptoms of a stroke. "BE FAST" is an easy way to remember the main warning signs of a stroke: ? B - Balance. Signs are dizziness, sudden trouble walking, or loss of balance. ? E - Eyes. Signs are trouble seeing or a sudden change in vision. ? F - Face. Signs are sudden weakness or numbness of the face, or the face or eyelid drooping on one side. ? A - Arms. Signs are weakness or numbness in an arm. This happens suddenly and usually on one side of the body. ? S - Speech. Signs are sudden trouble speaking, slurred speech, or trouble understanding what people say. ? T - Time. Time to call emergency services. Write down what time symptoms started.  Other signs of a stroke, such as: ? A sudden, severe headache with no known cause. ? Nausea or vomiting. ? Seizure. These symptoms may represent a serious problem that is an emergency. Do not wait to see if the symptoms will go away. Get medical help right away. Call your local emergency  services (911 in the [place] Do not drive yourself to the hospital. Summary  Atrial fibrillation is a type of irregular or rapid heartbeat (arrhythmia).  Symptoms include a feeling that your heart is beating fast or irregularly.  You may be given medicines to prevent blood clots or to treat heart rate or heart rhythm problems.  Get help right away if you have signs or symptoms of a stroke.  Get help right away if you cannot catch your breath or have chest pain or pressure. This information is not intended to replace advice given to you by your health care provider. Make sure you discuss any questions you have with your health care provider. Document Revised: 04/18/2019 Document Reviewed: 04/18/2019 Elsevier Patient Education  [place]. Hypertension, Adult High blood pressure (hypertension) is when the force of blood pumping through the arteries is too strong. The arteries are the blood vessels that carry blood from the heart throughout the body. Hypertension forces the heart to work harder to pump blood and may cause arteries to become narrow or stiff. Untreated or uncontrolled hypertension can cause a heart attack, heart failure, a stroke, kidney disease, and other problems. A blood pressure reading consists of a  higher number over a lower number. Ideally, your blood pressure should be below 120/80. The first ("top") number is called the systolic pressure. It is a measure of the pressure in your arteries as your heart beats. The second ("bottom") number is called the diastolic pressure. It is a measure of the pressure in your arteries as the heart relaxes. What are the causes? The exact cause of this condition is not known. There are some conditions that result in or are related to high blood pressure. What increases the risk? Some risk factors for high blood pressure are under your control. The following factors may make you more likely to develop this condition:  Smoking.  Having  type 2 diabetes mellitus, high cholesterol, or both.  Not getting enough exercise or physical activity.  Being overweight.  Having too much fat, sugar, calories, or salt (sodium) in your diet.  Drinking too much alcohol. Some risk factors for high blood pressure may be difficult or impossible to change. Some of these factors include:  Having chronic kidney disease.  Having a family history of high blood pressure.  Age. Risk increases with age.  Race. You may be at higher risk if you are African American.  Gender. Men are at higher risk than women before age 55. After age 43, women are at higher risk than men.  Having obstructive sleep apnea.  Stress. What are the signs or symptoms? High blood pressure may not cause symptoms. Very high blood pressure (hypertensive crisis) may cause:  Headache.  Anxiety.  Shortness of breath.  Nosebleed.  Nausea and vomiting.  Vision changes.  Severe chest pain.  Seizures. How is this diagnosed? This condition is diagnosed by measuring your blood pressure while you are seated, with your arm resting on a flat surface, your legs uncrossed, and your feet flat on the floor. The cuff of the blood pressure monitor will be placed directly against the skin of your upper arm at the level of your heart. It should be measured at least twice using the same arm. Certain conditions can cause a difference in blood pressure between your right and left arms. Certain factors can cause blood pressure readings to be lower or higher than normal for a short period of time:  When your blood pressure is higher when you are in a health care provider's office than when you are at home, this is called white coat hypertension. Most people with this condition do not need medicines.  When your blood pressure is higher at home than when you are in a health care provider's office, this is called masked hypertension. Most people with this condition may need medicines to  control blood pressure. If you have a high blood pressure reading during one visit or you have normal blood pressure with other risk factors, you may be asked to:  Return on a different day to have your blood pressure checked again.  Monitor your blood pressure at home for 1 week or longer. If you are diagnosed with hypertension, you may have other blood or imaging tests to help your health care provider understand your overall risk for other conditions. How is this treated? This condition is treated by making healthy lifestyle changes, such as eating healthy foods, exercising more, and reducing your alcohol intake. Your health care provider may prescribe medicine if lifestyle changes are not enough to get your blood pressure under control, and if:  Your systolic blood pressure is above 130.  Your diastolic blood pressure is above  23. Your personal target blood pressure may vary depending on your medical conditions, your age, and other factors. Follow these instructions at home: Eating and drinking   Eat a diet that is high in fiber and potassium, and low in sodium, added sugar, and fat. An example eating plan is called the DASH (Dietary Approaches to Stop Hypertension) diet. To eat this way: ? Eat plenty of fresh fruits and vegetables. Try to fill one half of your plate at each meal with fruits and vegetables. ? Eat whole grains, such as whole-wheat pasta, brown rice, or whole-grain bread. Fill about one fourth of your plate with whole grains. ? Eat or drink low-fat dairy products, such as skim milk or low-fat yogurt. ? Avoid fatty cuts of meat, processed or cured meats, and poultry with skin. Fill about one fourth of your plate with lean proteins, such as fish, chicken without skin, beans, eggs, or tofu. ? Avoid pre-made and processed foods. These tend to be higher in sodium, added sugar, and fat.  Reduce your daily sodium intake. Most people with hypertension should eat less than 1,500 mg  of sodium a day.  Do not drink alcohol if: ? Your health care provider tells you not to drink. ? You are pregnant, may be pregnant, or are planning to become pregnant.  If you drink alcohol: ? Limit how much you use to:  0-1 drink a day for women.  0-2 drinks a day for men. ? Be aware of how much alcohol is in your drink. In the [place] one drink equals one 12 oz bottle of beer (355 mL), one 5 oz glass of wine (148 mL), or one 1 oz glass of hard liquor (44 mL). Lifestyle   Work with your health care provider to maintain a healthy body weight or to lose weight. Ask what an ideal weight is for you.  Get at least 30 minutes of exercise most days of the week. Activities may include walking, swimming, or biking.  Include exercise to strengthen your muscles (resistance exercise), such as Pilates or lifting weights, as part of your weekly exercise routine. Try to do these types of exercises for 30 minutes at least 3 days a week.  Do not use any products that contain nicotine or tobacco, such as cigarettes, e-cigarettes, and chewing tobacco. If you need help quitting, ask your health care provider.  Monitor your blood pressure at home as told by your health care provider.  Keep all follow-up visits as told by your health care provider. This is important. Medicines  Take over-the-counter and prescription medicines only as told by your health care provider. Follow directions carefully. Blood pressure medicines must be taken as prescribed.  Do not skip doses of blood pressure medicine. Doing this puts you at risk for problems and can make the medicine less effective.  Ask your health care provider about side effects or reactions to medicines that you should watch for. Contact a health care provider if you:  Think you are having a reaction to a medicine you are taking.  Have headaches that keep coming back (recurring).  Feel dizzy.  Have swelling in your ankles.  Have trouble with your  vision. Get help right away if you:  Develop a severe headache or confusion.  Have unusual weakness or numbness.  Feel faint.  Have severe pain in your chest or abdomen.  Vomit repeatedly.  Have trouble breathing. Summary  Hypertension is when the force of blood pumping through your arteries is  too strong. If this condition is not controlled, it may put you at risk for serious complications.  Your personal target blood pressure may vary depending on your medical conditions, your age, and other factors. For most people, a normal blood pressure is less than 120/80.  Hypertension is treated with lifestyle changes, medicines, or a combination of both. Lifestyle changes include losing weight, eating a healthy, low-sodium diet, exercising more, and limiting alcohol. This information is not intended to replace advice given to you by your health care provider. Make sure you discuss any questions you have with your health care provider. Document Revised: 07/05/2018 Document Reviewed: 07/05/2018 Elsevier Patient Education  2020 [employer].

## 2020-08-20 NOTE — Progress Notes (Signed)
Subjective: PCP: Loman Brooklyn, FNP  Norma Barajas is a 79 y.o. female presenting to clinic today for chronic anticoagulation follow up and to discuss Xray results of lumbar spine.   1. Chronic anticoagulation for A. fib Current regimen: 1.5 mg on Tuesday, 3 mg all other days Last INR was 6.6 on 08/18/20, dose held until today's visit. She was prescribed oral vitamin K but was not able to pick up the prescription.     Indication:atrial fibrillation Bleeding Signs/Symptoms:None Thromboembolic Signs/Symptoms:None  Missed Coumadin Doses:2 as directed Medication Changes:no Dietary Changes:no Bacterial/Viral Infection:no  2. A. Fib Next follow up with cardiology in November.  Denies shortness of breath, chest pain, lower leg edema, weight gain, new fatigue, or orthopnea.   3. Low back pain Left sided low back pain for 2 months. Pain 8/10 that waxes and wanes. Pain feels like "something grabs me in there". She has tried a muscle relaxer without relief. Denies injury, changes in bowel or bladder control, or numbness or tingling down her legs.   4. Hypertension Nikia checks her BP at home and reports it is always "good". She is unable to recall a range. Denies chest pain, shortness or breath, new fatigue, edema, or dizziness.   Relevant past medical, surgical, family, and social history reviewed and updated as indicated.  Allergies and medications reviewed and updated.  Allergies  Allergen Reactions  . Codeine Nausea And Vomiting  . Doxycycline Other (See Comments)    Chest congestion  . Triamterene-Hctz Other (See Comments)    weakness  . Atorvastatin Other (See Comments)    Myalgias   . Crestor [Rosuvastatin Calcium] Other (See Comments)    weakness  . Morphine Nausea Only  . Risedronate Sodium Other (See Comments)    ACTONEL - reflux   Past Medical History:  Diagnosis Date  . 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 Anxiety   . Arthritis     "right leg" (04/11/2015)  . BPPV (benign paroxysmal positional vertigo) 03/23/2016  . Cataract   . Coronary atherosclerosis of native coronary artery    a. Nonobstructive minimal CAD 10/2005.  Marland Kitchen Depression   . Diastolic dysfunction    Grade 1. Ejection fraction 60-65%.  Marland Kitchen Dysrhythmia    a fib  . Erosive esophagitis   . Essential hypertension   . GERD (gastroesophageal reflux disease)   . Grade III hemorrhoids   . History of hiatal hernia   . Hypercholesterolemia   . Internal hemorrhoids with complication 6/44/0347   OCT 2015 FLEX SIG/IH BANDING    . Migraine    "used to have them right bad; I don't now" (04/11/2015)  . MITRAL REGURGITATION 04/27/2010   Qualifier: Diagnosis of  By: [name], MD, [name]   . Osteoporosis   . Paroxysmal atrial fibrillation (HCC)   . PONV (postoperative nausea and vomiting)   . Vertigo     Current Outpatient Medications:  .  acetaminophen (TYLENOL) 500 MG tablet, Take 1,000 mg by mouth 2 (two) times daily. , Disp: , Rfl:  .  amiodarone (PACERONE) 200 MG tablet, Take 1 tablet (200 mg total) by mouth daily., Disp: 90 tablet, Rfl: 1 .  Calcium Carbonate-Vitamin D (CALCIUM-VITAMIN D3 PO), Take 1 tablet by mouth daily., Disp: , Rfl:  .  cholecalciferol (VITAMIN D3) 25 MCG (1000 UT) tablet, Take 1,000 Units by mouth daily., Disp: , Rfl:  .  furosemide (LASIX) 40 MG tablet, Take 1 tablet (40 mg total) by  mouth daily., Disp: 90 tablet, Rfl: 2 .  loratadine (CLARITIN) 10 MG tablet, Take 10 mg by mouth daily as needed for allergies., Disp: , Rfl:  .  methocarbamol (ROBAXIN) 500 MG tablet, Take 1 tablet (500 mg total) by mouth 3 (three) times daily as needed for muscle spasms., Disp: 30 tablet, Rfl: 0 .  metoprolol tartrate (LOPRESSOR) 50 MG tablet, Take 1 tablet (50 mg total) by mouth 2 (two) times daily., Disp: 180 tablet, Rfl: 3 .  Multiple Vitamin (MULTIVITAMIN) capsule, Take 1 capsule by mouth every evening., Disp: , Rfl:  .  Omega-3 Fatty Acids  (FISH OIL) 1000 MG CAPS, Take 1,000 mg by mouth 2 (two) times daily. , Disp: , Rfl:  .  omeprazole (PRILOSEC) 20 MG capsule, Take 1 capsule (20 mg total) by mouth daily., Disp: 90 capsule, Rfl: 3 .  potassium chloride (KLOR-CON) 10 MEQ tablet, Take 3 tablets (30 mEq total) by mouth daily with breakfast., Disp: 60 tablet, Rfl: 3 .  simvastatin (ZOCOR) 20 MG tablet, Take 1 tablet (20 mg total) by mouth daily., Disp: 90 tablet, Rfl: 1 .  warfarin (COUMADIN) 3 MG tablet, TAKE 1/2 TO 1 TABLET EVERY DAY AS DIRECTED BY  ANTICOAGULATION CLINIC, Disp: 90 tablet, Rfl: 0 Social History   Socioeconomic History  . Marital status: Married    Spouse name: george   . Number of children: 1  . Years of education: Not on file  . Highest education level: Not on file  Occupational History  . Occupation: Retired    Comment: Textile  Tobacco Use  . Smoking status: Never Smoker  . Smokeless tobacco: Never Used  Vaping Use  . Vaping Use: Never used  Substance and Sexual Activity  . Alcohol use: No    Alcohol/week: 0.0 standard drinks  . Drug use: No  . Sexual activity: Not Currently    Birth control/protection: Surgical, Post-menopausal    Comment: hyst  Other Topics Concern  . Not on file  Social History Narrative   Married   No regular exercise   Social Determinants of Health   Financial Resource Strain:   . Difficulty of Paying Living Expenses: Not on file  Food Insecurity:   . Worried About Running Out of Food in the Last Year: Not on file  . Ran Out of Food in the Last Year: Not on file  Transportation Needs:   . Lack of Transportation (Medical): Not on file  . Lack of Transportation (Non-Medical): Not on file  Physical Activity:   . Days of Exercise per Week: Not on file  . Minutes of Exercise per Session: Not on file  Stress:   . Feeling of Stress : Not on file  Social Connections:   . Frequency of Communication with Friends and Family: Not on file  . Frequency of Social Gatherings  with Friends and Family: Not on file  . Attends Religious Services: Not on file  . Active Member of Clubs or Organizations: Not on file  . Attends Club or Organization Meetings: Not on file  . Marital Status: Not on file  Intimate Partner Violence:   . Fear of Current or Ex-Partner: Not on file  . Emotionally Abused: Not on file  . Physically Abused: Not on file  . Sexually Abused: Not on file   Family History  Problem Relation Age of Onset  . Colon cancer Mother 80  . Heart disease Mother   . Osteoporosis Mother   . Hip fracture Mother   .   Stroke Sister   . Diabetes Sister   . Osteoporosis Sister   . Arthritis Sister   . Uterine cancer Sister   . Stroke Sister   . Heart disease Father   . Hyperlipidemia Brother   . Hypertension Brother   . Heart disease Brother   . Stroke Brother   . Heart disease Sister   . Dementia Sister   . Diabetes Son   . Stroke Son   . Heart attack Neg Hx     Review of Systems   Objective: Office vital signs reviewed. BP (!) 142/92   Pulse (!) 118   Temp 98 F (36.7 C) (Temporal)   Ht 5' 1" (1.549 m)   Wt 120 lb 8 oz (54.7 kg)   BMI 22.77 kg/m   Physical Examination:  Physical Exam Vitals and nursing note reviewed.  Constitutional:      General: She is not in acute distress.    Appearance: She is normal weight. She is not ill-appearing or diaphoretic.  Neck:     Thyroid: No thyroid mass or thyromegaly.     Vascular: No carotid bruit.     Trachea: Trachea normal.     Comments: Pulsations visible on both sides of neck that correlate with heart rate while sitting upright  Cardiovascular:     Rate and Rhythm: Rhythm irregularly irregular.     Chest Wall: PMI is not displaced. No thrill.     Heart sounds: No murmur heard.   Pulmonary:     Effort: Pulmonary effort is normal. No respiratory distress.  Musculoskeletal:     Cervical back: Neck supple. No tenderness.     Lumbar back: No swelling, tenderness or bony tenderness.      Right lower leg: No swelling or tenderness. No edema.     Left lower leg: No swelling or tenderness. No edema.  Skin:    General: Skin is warm and dry.  Neurological:     General: No focal deficit present.     Mental Status: She is alert and oriented to person, place, and time.  Psychiatric:        Mood and Affect: Mood normal.        Behavior: Behavior normal.      Results for orders placed or performed in visit on 08/18/20  CoaguChek XS/INR Waived  Result Value Ref Range   INR 7.7 (HH) 0.9 - 1.1   Prothrombin Time 92.1 sec  CBC with Differential/Platelet  Result Value Ref Range   WBC 6.8 3.4 - 10.8 x10E3/uL   RBC 5.22 3.77 - 5.28 x10E6/uL   Hemoglobin 15.8 11.1 - 15.9 g/dL   Hematocrit 48.5 (H) 34.0 - 46.6 %   MCV 93 79 - 97 fL   MCH 30.3 26.6 - 33.0 pg   MCHC 32.6 31 - 35 g/dL   RDW 15.6 (H) 11.7 - 15.4 %   Platelets 143 (L) 150 - 450 x10E3/uL   Neutrophils 65 Not Estab. %   Lymphs 23 Not Estab. %   Monocytes 11 Not Estab. %   Eos 1 Not Estab. %   Basos 0 Not Estab. %   Neutrophils Absolute 4.4 1 - 7 x10E3/uL   Lymphocytes Absolute 1.6 0 - 3 x10E3/uL   Monocytes Absolute 0.8 0 - 0 x10E3/uL   EOS (ABSOLUTE) 0.1 0.0 - 0.4 x10E3/uL   Basophils Absolute 0.0 0 - 0 x10E3/uL   Immature Granulocytes 0 Not Estab. %   Immature Grans (Abs) 0.0 0.0 -   0.1 x10E3/uL   NRBC 1 (H) 0 - 0 %  CMP14+EGFR  Result Value Ref Range   Glucose CANCELED mg/dL   BUN CANCELED    Creatinine, Ser CANCELED    Sodium CANCELED    Potassium CANCELED    Chloride CANCELED    CO2 CANCELED    Calcium CANCELED    Total Protein CANCELED    Albumin CANCELED    Bilirubin Total CANCELED    Alkaline Phosphatase CANCELED    AST CANCELED    ALT CANCELED   Pro b natriuretic peptide  Result Value Ref Range   NT-Pro BNP CANCELED pg/mL  Protime-INR  Result Value Ref Range   INR 6.1 (HH) 0.9 - 1.2   Prothrombin Time 59.7 (H) 9.1 - 12.0 sec  POCT INR  Result Value Ref Range   INR 6.7 (A) 2.0 - 3.0      Assessment/ Plan: Norma Barajas was seen today for atrial fibrillation. Discussed case with PCP today.   Diagnoses and all orders for this visit:  Atrial fibrillation, unspecified type (HCC) Chronic anticoagulation INR 4.6 today. Hold dose for today. Decrease regimen to 1.5 mg on Tuesday and Friday, 3 mg all other days. Follow up in 1 week for INR.  -     CoaguChek XS/INR Waived -     POCT INR  Essential hypertension BP 142/92 today, likely due to pain. Patient reports BP is controlled at home. Lab pending. Log given for patient to fill out with home reading and follow up next week.  -     Brain natriuretic peptide  Acute left-sided low back pain without sciatica Discussed radiology report with patient. There is no acute fracture. This is degenerative changes. Tylenol, heat, rest. She has failed muscle relaxers. She has a history of falls so will avoid stronger pain medications.   Follow up in 1 week for INR, BP check.   The above assessment and management plan was discussed with the patient. The patient verbalized understanding of and has agreed to the management plan. Patient is aware to call the clinic if symptoms persist or worsen. Patient is aware when to return to the clinic for a follow-up visit. Patient educated on when it is appropriate to go to the emergency department.   Tiffany Morgan, FNP-C [facility] [address] [place], [place] [address] [phone]   

## 2020-08-21 ENCOUNTER — Other Ambulatory Visit: Payer: Self-pay | Admitting: Family Medicine

## 2020-08-21 DIAGNOSIS — I10 Essential (primary) hypertension: Secondary | ICD-10-CM

## 2020-08-21 LAB — CBC WITH DIFFERENTIAL/PLATELET
Basophils Absolute: 0 10*3/uL (ref 0.0–0.2)
Basos: 1 %
EOS (ABSOLUTE): 0.1 10*3/uL (ref 0.0–0.4)
Eos: 1 %
Hematocrit: 46.4 % (ref 34.0–46.6)
Hemoglobin: 15.3 g/dL (ref 11.1–15.9)
Immature Grans (Abs): 0 10*3/uL (ref 0.0–0.1)
Immature Granulocytes: 1 %
Lymphocytes Absolute: 1.2 10*3/uL (ref 0.7–3.1)
Lymphs: 18 %
MCH: 30.8 pg (ref 26.6–33.0)
MCHC: 33 g/dL (ref 31.5–35.7)
MCV: 94 fL (ref 79–97)
Monocytes Absolute: 0.7 10*3/uL (ref 0.1–0.9)
Monocytes: 10 %
Neutrophils Absolute: 4.7 10*3/uL (ref 1.4–7.0)
Neutrophils: 69 %
Platelets: 130 10*3/uL — ABNORMAL LOW (ref 150–450)
RBC: 4.96 x10E6/uL (ref 3.77–5.28)
RDW: 15.6 % — ABNORMAL HIGH (ref 11.7–15.4)
WBC: 6.7 10*3/uL (ref 3.4–10.8)

## 2020-08-21 LAB — BRAIN NATRIURETIC PEPTIDE: BNP: 649 pg/mL — ABNORMAL HIGH (ref 0.0–100.0)

## 2020-08-21 MED ORDER — LISINOPRIL 5 MG PO TABS: 5.0000 mg | ORAL_TABLET | Freq: Every day | ORAL | 0 refills | Status: DC

## 2020-08-25 ENCOUNTER — Telehealth: Payer: Self-pay

## 2020-08-25 ENCOUNTER — Ambulatory Visit (INDEPENDENT_AMBULATORY_CARE_PROVIDER_SITE_OTHER): Payer: Medicare HMO | Admitting: *Deleted

## 2020-08-25 ENCOUNTER — Other Ambulatory Visit: Payer: Self-pay

## 2020-08-25 VITALS — BP 129/93 | HR 115

## 2020-08-25 DIAGNOSIS — I10 Essential (primary) hypertension: Secondary | ICD-10-CM

## 2020-08-25 NOTE — Telephone Encounter (Signed)
Please have her come in with BP monitor to check with nurse today

## 2020-08-25 NOTE — Telephone Encounter (Signed)
Appointment scheduled.

## 2020-08-25 NOTE — Progress Notes (Signed)
Patient in today to compare her BP machine to manual reading. Machine 129/93, Manual 132/90. Patient reports she feels horrible and weak. Per Dr Lajuana Ripple advised patient to go to ER.

## 2020-08-25 NOTE — Telephone Encounter (Signed)
Patient states that her BP has been running higher than normal since her medication has been changed to lisinopril 5mg  on Wednesday - below are readings  Wednesday - 118/82 Thursday- 121/89 Sunday- 141/97 This morning- 118/101 Asked patent to take BP while I was on the phone with her- 143/109 p-94 & 151/109    Covering PCP- please advise

## 2020-08-27 ENCOUNTER — Ambulatory Visit (INDEPENDENT_AMBULATORY_CARE_PROVIDER_SITE_OTHER): Payer: Medicare HMO | Admitting: Family Medicine

## 2020-08-27 ENCOUNTER — Ambulatory Visit: Payer: Medicare HMO | Admitting: Cardiology

## 2020-08-27 ENCOUNTER — Encounter: Payer: Self-pay | Admitting: Cardiology

## 2020-08-27 ENCOUNTER — Encounter: Payer: Self-pay | Admitting: Family Medicine

## 2020-08-27 ENCOUNTER — Other Ambulatory Visit: Payer: Self-pay

## 2020-08-27 ENCOUNTER — Telehealth: Payer: Self-pay | Admitting: Cardiology

## 2020-08-27 VITALS — BP 122/90 | HR 76 | Ht 61.0 in | Wt 118.0 lb

## 2020-08-27 VITALS — BP 165/118 | HR 103 | Temp 97.3°F | Ht 61.0 in | Wt 117.4 lb

## 2020-08-27 DIAGNOSIS — I4891 Unspecified atrial fibrillation: Secondary | ICD-10-CM

## 2020-08-27 DIAGNOSIS — I160 Hypertensive urgency: Secondary | ICD-10-CM

## 2020-08-27 DIAGNOSIS — Z7901 Long term (current) use of anticoagulants: Secondary | ICD-10-CM | POA: Diagnosis not present

## 2020-08-27 DIAGNOSIS — I10 Essential (primary) hypertension: Secondary | ICD-10-CM

## 2020-08-27 LAB — POCT INR: INR: 3.9 — AB (ref 2–3)

## 2020-08-27 LAB — COAGUCHEK XS/INR WAIVED
INR: 3.9 — ABNORMAL HIGH (ref 0.9–1.1)
Prothrombin Time: 46.6 s

## 2020-08-27 MED ORDER — LISINOPRIL 10 MG PO TABS: 10.0000 mg | ORAL_TABLET | Freq: Every day | ORAL | 0 refills | Status: DC

## 2020-08-27 NOTE — Telephone Encounter (Signed)
Pt c/o BP issue: STAT if pt c/o blurred vision, one-sided weakness or slurred speech  1. What are your last 5 BP readings?  149/109 yesterday 10/19 165/118 HR 103 this morning  Pt stated she seen her PCP this morning and they told her she needed to see her Heart Dr as soon has she could and if she started having any chest pain to go to the ED.  Pt Denys any chest pains right now  2. Are you having any other symptoms (ex. Dizziness, headache, blurred vision, passed out)? Headaches at times . aif is up   3. What is your BP issue? Pt stated her BP started going up about a month a go.    Best number [phone]

## 2020-08-27 NOTE — Patient Instructions (Signed)

## 2020-08-27 NOTE — Progress Notes (Signed)
Cardiology Office Note   Date:  08/27/2020   ID:  Norma, Barajas 08-Aug-1941, MRN [id number]  PCP:  Norma Brooklyn, FNP  Cardiologist:   Minus Breeding, MD   Chief Complaint  Patient presents with  . Fatigue       History of Present Illness: Norma Barajas is a 79 y.o. female who presents for follow up of atrial fibrillation.  She has been seen by Dr. [name] and Dr. Curt Bears.  She did have an essentially normal echo in 2017.  She was treated with Tikosyn for a while but could not afford this.  She has been treated with Sotalol.  She has had recurrent fibrillation and was followed in the Atrial Fib clinic.  She has been treated with amiodarone and DCCV and she had recurrent fib.  She was to have another DCCV but she declined and wanted to pursue rate control with amiodarone.    She saw her primary provider today and was noted to be hypertensive 165/118.  She was told to take 10 mg of lisinopril and we are asked to work her into our schedule.  Patient has been under considerable stress since her son was killed in June in a car accident when somebody ran a stoplight.  She has not been sleeping well.  She has been very anxious.  She has been fatigued.  She has decreased appetite with weight loss.  She does not really notice her palpitations and is not having any presyncope or syncope.  She has had no chest pressure, neck or arm discomfort.  She had no new shortness of breath.   Past Medical History:  Diagnosis Date  . 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 Anxiety   . Arthritis    "right leg" (04/11/2015)  . BPPV (benign paroxysmal positional vertigo) 03/23/2016  . Cataract   . Coronary atherosclerosis of native coronary artery    a. Nonobstructive minimal CAD 10/2005.  Marland Kitchen Depression   . Diastolic dysfunction    Grade 1. Ejection fraction 60-65%.  Marland Kitchen Dysrhythmia    a fib  . Erosive esophagitis   . Essential hypertension   . GERD (gastroesophageal reflux disease)   .  Grade III hemorrhoids   . History of hiatal hernia   . Hypercholesterolemia   . Internal hemorrhoids with complication 9/39/0300   OCT 2015 FLEX SIG/IH BANDING    . Migraine    "used to have them right bad; I don't now" (04/11/2015)  . MITRAL REGURGITATION 04/27/2010   Qualifier: Diagnosis of  By: [name], MD, [name]   . Osteoporosis   . Paroxysmal atrial fibrillation (HCC)   . PONV (postoperative nausea and vomiting)   . Vertigo     Past Surgical History:  Procedure Laterality Date  . BRAVO [place] STUDY  11/15/2012   Procedure: BRAVO [place];  Surgeon: [name], MD;  Location: [facility] ENDO SUITE;  Service: Endoscopy;;  . CARDIOVERSION N/A 05/16/2020   Procedure: CARDIOVERSION;  Surgeon: Donato Heinz, MD;  Location: [facility] ENDOSCOPY;  Service: Cardiovascular;  Laterality: N/A;  . CATARACT EXTRACTION W/ INTRAOCULAR LENS  IMPLANT, BILATERAL Bilateral   . COLONOSCOPY  2008   Dr. [name]: internal hemorrhoids   . DILATION AND CURETTAGE OF UTERUS    . ESOPHAGOGASTRODUODENOSCOPY (EGD) WITH ESOPHAGEAL DILATION  2001   Dr. [name]: erosive esophagitis, esophageal stricture, duodenitis, s/p Savary dilation  . ESOPHAGOGASTRODUODENOSCOPY (EGD) WITH ESOPHAGEAL DILATION  11/15/2012   [id number]  web was found & [place] DYAPHAGIA/Polyp was found in the gastric body and gastric fundus/ gastritis on bx  . EYE SURGERY Bilateral    "laser OR after cataract OR; cause I couldn't see"  . FLEXIBLE SIGMOIDOSCOPY N/A 08/22/2014   mild diverticulosis in sigmoid, moderate sized Grade 3 hemorrhoids s/p banding X 3.   Marland Kitchen FRACTURE SURGERY Right    below the knee - 2 bones broke has plates  . HEMORRHOID BANDING N/A 08/22/2014   Procedure: HEMORRHOID BANDING;  Surgeon: [name], MD;  Location: [facility] ENDO SUITE;  Service: Endoscopy;  Laterality: N/A;  . HEMORRHOID SURGERY N/A 03/30/2018   Procedure: EXTENSIVE HEMORRHOIDECTOMY;  Surgeon: [name], MD;  Location: [facility] ORS;   Service: General;  Laterality: N/A;  . OPEN REDUCTION INTERNAL FIXATION (ORIF) TIBIA/FIBULA FRACTURE Right 2013   broke tibia and fibula after falling down stairs  . PROLAPSED UTERINE FIBROID LIGATION  2015  . TOTAL ABDOMINAL HYSTERECTOMY       Current Outpatient Medications  Medication Sig Dispense Refill  . acetaminophen (TYLENOL) 500 MG tablet Take 1,000 mg by mouth 2 (two) times daily.     Marland Kitchen amiodarone (PACERONE) 200 MG tablet Take 1 tablet (200 mg total) by mouth daily. 90 tablet 1  . Calcium Carbonate-Vitamin D (CALCIUM-VITAMIN D3 PO) Take 1 tablet by mouth daily.    . cholecalciferol (VITAMIN D3) 25 MCG (1000 UT) tablet Take 1,000 Units by mouth daily.    . furosemide (LASIX) 40 MG tablet Take 1 tablet (40 mg total) by mouth daily. 90 tablet 2  . lisinopril (ZESTRIL) 10 MG tablet Take 1 tablet (10 mg total) by mouth daily. 90 tablet 0  . loratadine (CLARITIN) 10 MG tablet Take 10 mg by mouth daily as needed for allergies.    . metoprolol tartrate (LOPRESSOR) 50 MG tablet Take 1 tablet (50 mg total) by mouth 2 (two) times daily. 180 tablet 3  . Multiple Vitamin (MULTIVITAMIN) capsule Take 1 capsule by mouth every evening.    . Omega-3 Fatty Acids (FISH OIL) 1000 MG CAPS Take 1,000 mg by mouth 2 (two) times daily.     Marland Kitchen omeprazole (PRILOSEC) 20 MG capsule Take 1 capsule (20 mg total) by mouth daily. 90 capsule 3  . simvastatin (ZOCOR) 20 MG tablet Take 1 tablet (20 mg total) by mouth daily. 90 tablet 1  . warfarin (COUMADIN) 3 MG tablet TAKE 1/2 TO 1 TABLET EVERY DAY AS DIRECTED BY  ANTICOAGULATION CLINIC 90 tablet 0   No current facility-administered medications for this visit.    Allergies:   Codeine, Doxycycline, Triamterene-hctz, Atorvastatin, Crestor [rosuvastatin calcium], Morphine, and Risedronate sodium    ROS:  Please see the history of present illness.   Otherwise, review of systems are positive for none.   All other systems are reviewed and negative.    PHYSICAL  EXAM: VS:  BP 122/90   Pulse 76   Ht 5\' 1"  (1.549 m)   Wt 118 lb (53.5 kg)   BMI 22.30 kg/m  , BMI Body mass index is 22.3 kg/m. GENERAL: Frail appearing NECK:  No jugular venous distention, waveform within normal limits, carotid upstroke brisk and symmetric, no bruits, no thyromegaly LUNGS:  Clear to auscultation bilaterally CHEST:  Unremarkable HEART:  PMI not displaced or sustained,S1 and S2 within normal limits, no S3, no S4, no clicks, no rubs, no murmurs ABD:  Flat, positive bowel sounds normal in frequency in pitch, no bruits, no rebound, no guarding,  no midline pulsatile mass, no hepatomegaly, no splenomegaly EXT:  2 plus pulses throughout, no edema, no cyanosis no clubbing  EKG:  EKG is not ordered today.   Recent Labs: 05/20/2020: TSH 0.805 08/18/2020: ALT CANCELED; BUN CANCELED; Creatinine, Ser CANCELED; NT-Pro BNP CANCELED; Potassium CANCELED; Sodium CANCELED 08/20/2020: BNP 649.0; Hemoglobin 15.3; Platelets 130    Lipid Panel    Component Value Date/Time   CHOL 132 08/05/2020 1523   CHOL 192 05/15/2013 0829   TRIG 128 08/05/2020 1523   TRIG 258 (H) 03/19/2014 0812   TRIG 179 (H) 05/15/2013 0829   HDL 39 (L) 08/05/2020 1523   HDL 45 03/19/2014 0812   HDL 44 05/15/2013 0829   CHOLHDL 3.4 08/05/2020 1523   CHOLHDL 3.9 02/10/2016 0208   VLDL 33 02/10/2016 0208   LDLCALC 70 08/05/2020 1523   LDLCALC 101 (H) 03/19/2014 0812   LDLCALC 112 (H) 05/15/2013 0829      Wt Readings from Last 3 Encounters:  08/27/20 118 lb (53.5 kg)  08/27/20 117 lb 6.4 oz (53.3 kg)  08/20/20 120 lb 8 oz (54.7 kg)      Other studies Reviewed: Additional studies/ records that were reviewed today include:  Primary care notes Review of the above records demonstrates:  See elsewhere   ASSESSMENT AND PLAN:  ATRIAL FIBRILLATION:      She tolerates this with rate control and anticoagulation.  No change in therapy.  HTN:    His blood pressure is better currently.  I agree with  increasing lisinopril to 10 twice daily is the right thing to do.  I would also continue to manage her anxiety will defer to Norma Brooklyn, FNP  COVID EDUCATION: She has received both of her vaccines.   Current medicines are reviewed at length with the patient today.  The patient does not have concerns regarding medicines.  The following changes have been made:  None  Labs/ tests ordered today include:  None  No orders of the defined types were placed in this encounter.    Disposition:   FU with me in 6 months     Signed, Minus Breeding, MD  08/27/2020 3:00 PM    [place]

## 2020-08-27 NOTE — Progress Notes (Signed)
Subjective: CC: chronic anticoagulation PCP: Loman Brooklyn, FNP  Norma Barajas is a 79 y.o. female presenting to clinic today for:  1. Chronic anticoagulation Current regimen: 1.5 mg on Tuesdays and Fridays, 3 mg all other days Last INR was 4.6 on 08/20/20, dose held for one day, and regimen decreased about 18%.   Indication:atrial fibrillation Bleeding Signs/Symptoms:None Thromboembolic Signs/Symptoms:None  Missed Coumadin Doses:1 as directed Medication Changes:no Dietary Changes:no Bacterial/Viral Infection:no  2. HTN Checking BPs at home but forgot to bring log. Has been running 130-150s/90-120. Started lisinopril last Wednesday. Denies chest pain, shortness of breath, changes in vision, or edema. She does report headaches sometimes. She usually feels fatigued and weak, but reports that this is worse than usual.    Relevant past medical, surgical, family, and social history reviewed and updated as indicated.  Allergies and medications reviewed and updated.  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 Nonobstructive minimal CAD 10/2005.   Depression    Diastolic dysfunction    Grade 1. Ejection fraction 60-65%.   Dysrhythmia    a fib   Erosive esophagitis    Essential hypertension    GERD (gastroesophageal reflux disease)    Grade III  hemorrhoids    History of hiatal hernia    Hypercholesterolemia    Internal hemorrhoids with complication 0/98/1191   OCT 2015 FLEX SIG/IH BANDING     Migraine    "used to have them right bad; I don't now" (04/11/2015)   MITRAL REGURGITATION 04/27/2010   Qualifier: Diagnosis of  By: [name], MD, [name]    Osteoporosis    Paroxysmal atrial fibrillation (HCC)    PONV (postoperative nausea and vomiting)    Vertigo     Current Outpatient Medications:    acetaminophen (TYLENOL) 500 MG tablet, Take 1,000 mg by mouth 2 (two) times daily. , Disp: , Rfl:    amiodarone (PACERONE) 200 MG tablet, Take 1 tablet (200 mg total) by mouth daily., Disp: 90 tablet, Rfl: 1   Calcium Carbonate-Vitamin D (CALCIUM-VITAMIN D3 PO), Take 1 tablet by mouth daily., Disp: , Rfl:    cholecalciferol (VITAMIN D3) 25 MCG (1000 UT) tablet, Take 1,000 Units by mouth daily., Disp: , Rfl:    furosemide (LASIX) 40 MG tablet, Take 1 tablet (40 mg total) by mouth daily., Disp: 90 tablet, Rfl: 2   lisinopril (ZESTRIL) 5 MG tablet, Take 1 tablet (5 mg total) by mouth daily for 30 doses., Disp: 30 tablet, Rfl: 0   loratadine (CLARITIN) 10 MG tablet, Take 10 mg by mouth daily as needed for allergies., Disp: , Rfl:    methocarbamol (ROBAXIN) 500 MG tablet, Take 1 tablet (500 mg total) by mouth 3 (three) times daily as needed for muscle spasms., Disp: 30 tablet, Rfl: 0   metoprolol  tartrate (LOPRESSOR) 50 MG tablet, Take 1 tablet (50 mg total) by mouth 2 (two) times daily., Disp: 180 tablet, Rfl: 3   Multiple Vitamin (MULTIVITAMIN) capsule, Take 1 capsule by mouth every evening., Disp: , Rfl:    Omega-3 Fatty Acids (FISH OIL) 1000 MG CAPS, Take 1,000 mg by mouth 2 (two) times daily. , Disp: , Rfl:    omeprazole (PRILOSEC) 20 MG capsule, Take 1 capsule (20 mg total) by mouth daily., Disp: 90 capsule, Rfl: 3   potassium chloride (KLOR-CON) 10 MEQ tablet, Take 3 tablets (30 mEq total) by mouth daily with  breakfast., Disp: 60 tablet, Rfl: 3   simvastatin (ZOCOR) 20 MG tablet, Take 1 tablet (20 mg total) by mouth daily., Disp: 90 tablet, Rfl: 1   warfarin (COUMADIN) 3 MG tablet, TAKE 1/2 TO 1 TABLET EVERY DAY AS DIRECTED BY  ANTICOAGULATION CLINIC, Disp: 90 tablet, Rfl: 0 Social History   Socioeconomic History   Marital status: Married    Spouse name: george    Number of children: 1   Years of education: Not on file   Highest education level: Not on file  Occupational History   Occupation: Retired    Comment: Textile  Tobacco Use   Smoking status: Never Smoker   Smokeless tobacco: Never Used  Scientific laboratory technician Use: Never used  Substance and Sexual Activity   Alcohol use: No    Alcohol/week: 0.0 standard drinks   Drug use: No   Sexual activity: Not Currently    Birth control/protection: Surgical, Post-menopausal    Comment: hyst  Other Topics Concern   Not on file  Social History Narrative   Married   No regular exercise   Social Determinants of Health   Financial Resource Strain:    Difficulty of Paying Living Expenses: Not on file  Food Insecurity:    Worried About Charity fundraiser in the Last Year: Not on file   [employer] of Food in the Last Year: Not on file  Transportation Needs:    Lack of Transportation (Medical): Not on file   Lack of Transportation (Non-Medical): Not on file  Physical Activity:    Days of Exercise per Week: Not on file   Minutes of Exercise per Session: Not on file  Stress:    Feeling of Stress : Not on file  Social Connections:    Frequency of Communication with Friends and Family: Not on file   Frequency of Social Gatherings with Friends and Family: Not on file   Attends Religious Services: Not on file   Active Member of Clubs or Organizations: Not on file   Attends Archivist Meetings: Not on file   Marital Status: Not on file  Intimate Partner Violence:    Fear of Current or Ex-Partner: Not on  file   Emotionally Abused: Not on file   Physically Abused: Not on file   Sexually Abused: Not on file   Family History  Problem Relation Age of Onset   Colon cancer Mother 67   Heart disease Mother    Osteoporosis Mother    Hip fracture Mother    Stroke Sister    Diabetes Sister    Osteoporosis Sister    Arthritis Sister    Uterine cancer Sister    Stroke Sister    Heart disease Father    Hyperlipidemia Brother    Hypertension Brother    Heart disease Brother    Stroke Brother    Heart  disease Sister    Dementia Sister    Diabetes Son    Stroke Son    Heart attack Neg Hx     Review of Systems  Per HPI.   Objective: Office vital signs reviewed. BP (!) 165/118    Pulse (!) 103    Temp (!) 97.3 F (36.3 C) (Temporal)    Ht 5\' 1"  (1.549 m)    Wt 117 lb 6.4 oz (53.3 kg)    SpO2 96%    BMI 22.18 kg/m   Physical Examination:  Physical Exam Vitals and nursing note reviewed.  Constitutional:      General: She is not in acute distress.    Appearance: Normal appearance. She is not ill-appearing, toxic-appearing or diaphoretic.  Cardiovascular:     Rate and Rhythm: Rhythm irregularly irregular.     Heart sounds: Normal heart sounds. No murmur heard.   Pulmonary:     Effort: Pulmonary effort is normal. No respiratory distress.     Breath sounds: Normal breath sounds. No rales.  Musculoskeletal:     Right lower leg: No edema.     Left lower leg: No edema.  Skin:    General: Skin is warm and dry.  Neurological:     General: No focal deficit present.     Mental Status: She is alert and oriented to person, place, and time.  Psychiatric:        Mood and Affect: Mood normal.        Behavior: Behavior normal.      Results for orders placed or performed in visit on 08/27/20  POCT INR  Result Value Ref Range   INR 3.9 (A) 2.0 - 3.0     Assessment/ Plan: 79 y.o. female   1. Chronic anticoagulation 2. Atrial fibrillation, unspecified type  (HCC) INR 3.9 today. Goal is 2-3. Hold dose for today. Decrease regimen to 1.5 mg on sundays, tuesdays, and fridays, 3 mg on all other days. Follow up in 1 week for INR recheck.  - CoaguChek XS/INR Waived - POCT INR  3. Essential hypertension BP 165/118 today in office. Increase lisinopril to 10 mg. CMP today. Denies chest pain, shortness of breath, edema, or changes in vision. She does feel fatigued and weak, more so than usual. Instructed to call cardiologist to be worked in. Strict instructs for when to seek emergency care.  - CMP+GFR - POCT INR  Follow up in 1 week for INR recheck. Patient to call cardiologist for appointment for elevated BP.   The above assessment and management plan was discussed with the patient. The patient verbalized understanding of and has agreed to the management plan. Patient is aware to call the clinic if symptoms persist or worsen. Patient is aware when to return to the clinic for a follow-up visit. Patient educated on when it is appropriate to go to the emergency department.   Marjorie Smolder, FNP-C [facility] [address] [place], [place] [address] [phone]

## 2020-08-27 NOTE — Addendum Note (Signed)
Addended by: Earlene Plater on: 08/27/2020 12:01 PM   Modules accepted: Orders

## 2020-08-27 NOTE — Telephone Encounter (Signed)
Patient called to report that she saw her PCP this morning for elevated BP 165/118 and heart rate 103. Her PCP increased her lisinopril to 10 mg and did lab work but advised the patient she should call Cardio to be worked in today.   Patient to see Dr. Percival Spanish in [place] today at 1:40 and reminded her to bring a copy of all her BP readings with her. Patient agreed.

## 2020-08-27 NOTE — Patient Instructions (Signed)
Medication Instructions:  The current medical regimen is effective;  continue present plan and medications.  *If you need a refill on your cardiac medications before your next appointment, please call your pharmacy*  Follow-Up: At [facility], you and your health needs are our priority.  As part of our continuing mission to provide you with exceptional heart care, we have created designated Provider Care Teams.  These Care Teams include your primary Cardiologist (physician) and Advanced Practice Providers (APPs -  Physician Assistants and Nurse Practitioners) who all work together to provide you with the care you need, when you need it.  We recommend signing up for the patient portal called "MyChart".  Sign up information is provided on this After Visit Summary.  MyChart is used to connect with patients for Virtual Visits (Telemedicine).  Patients are able to view lab/test results, encounter notes, upcoming appointments, etc.  Non-urgent messages can be sent to your provider as well.   To learn more about what you can do with MyChart, go to [email or web address].    Your next appointment:   6 month(s)  The format for your next appointment:   In Person  Provider:   James Hochrein, MD   Thank you for choosing [facility]!!     

## 2020-08-27 NOTE — Telephone Encounter (Signed)
Noted  

## 2020-08-28 ENCOUNTER — Other Ambulatory Visit: Payer: Self-pay | Admitting: Family Medicine

## 2020-08-28 DIAGNOSIS — E876 Hypokalemia: Secondary | ICD-10-CM

## 2020-08-28 LAB — CMP14+EGFR
ALT: 16 IU/L (ref 0–32)
AST: 22 IU/L (ref 0–40)
Albumin/Globulin Ratio: 1.7 (ref 1.2–2.2)
Albumin: 4.2 g/dL (ref 3.6–4.6)
Alkaline Phosphatase: 81 IU/L (ref 44–121)
BUN/Creatinine Ratio: 13 (ref 12–28)
BUN: 15 mg/dL (ref 8–27)
Bilirubin Total: 0.6 mg/dL (ref 0.0–1.2)
CO2: 33 mmol/L — ABNORMAL HIGH (ref 20–29)
Calcium: 9 mg/dL (ref 8.7–10.3)
Chloride: 98 mmol/L (ref 96–106)
Creatinine, Ser: 1.2 mg/dL — ABNORMAL HIGH (ref 0.57–1.00)
GFR calc Af Amer: 49 mL/min/{1.73_m2} — ABNORMAL LOW (ref >59)
GFR calc non Af Amer: 42 mL/min/{1.73_m2} — ABNORMAL LOW (ref >59)
Globulin, Total: 2.5 g/dL (ref 1.5–4.5)
Glucose: 139 mg/dL — ABNORMAL HIGH (ref 65–99)
Potassium: 2.7 mmol/L — ABNORMAL LOW (ref 3.5–5.2)
Sodium: 147 mmol/L — ABNORMAL HIGH (ref 134–144)
Total Protein: 6.7 g/dL (ref 6.0–8.5)

## 2020-08-28 MED ORDER — POTASSIUM CHLORIDE ER 10 MEQ PO TBCR: 30.0000 meq | EXTENDED_RELEASE_TABLET | Freq: Every day | ORAL | 3 refills | Status: DC

## 2020-09-01 ENCOUNTER — Ambulatory Visit: Payer: Medicare HMO | Admitting: General Practice

## 2020-09-03 ENCOUNTER — Other Ambulatory Visit: Payer: Self-pay | Admitting: Family Medicine

## 2020-09-04 ENCOUNTER — Encounter: Payer: Self-pay | Admitting: Family Medicine

## 2020-09-04 ENCOUNTER — Other Ambulatory Visit: Payer: Self-pay

## 2020-09-04 ENCOUNTER — Ambulatory Visit (INDEPENDENT_AMBULATORY_CARE_PROVIDER_SITE_OTHER): Payer: Medicare HMO | Admitting: Family Medicine

## 2020-09-04 VITALS — BP 150/103 | HR 107 | Temp 97.7°F | Ht 61.0 in | Wt 121.1 lb

## 2020-09-04 DIAGNOSIS — M47815 Spondylosis without myelopathy or radiculopathy, thoracolumbar region: Secondary | ICD-10-CM | POA: Diagnosis not present

## 2020-09-04 DIAGNOSIS — I10 Essential (primary) hypertension: Secondary | ICD-10-CM

## 2020-09-04 DIAGNOSIS — E876 Hypokalemia: Secondary | ICD-10-CM

## 2020-09-04 DIAGNOSIS — Z7901 Long term (current) use of anticoagulants: Secondary | ICD-10-CM

## 2020-09-04 LAB — BMP8+EGFR
BUN/Creatinine Ratio: 12 (ref 12–28)
BUN: 14 mg/dL (ref 8–27)
CO2: 30 mmol/L — ABNORMAL HIGH (ref 20–29)
Calcium: 8.7 mg/dL (ref 8.7–10.3)
Chloride: 97 mmol/L (ref 96–106)
Creatinine, Ser: 1.15 mg/dL — ABNORMAL HIGH (ref 0.57–1.00)
GFR calc Af Amer: 52 mL/min/{1.73_m2} — ABNORMAL LOW (ref >59)
GFR calc non Af Amer: 45 mL/min/{1.73_m2} — ABNORMAL LOW (ref >59)
Glucose: 89 mg/dL (ref 65–99)
Potassium: 3.5 mmol/L (ref 3.5–5.2)
Sodium: 144 mmol/L (ref 134–144)

## 2020-09-04 LAB — COAGUCHEK XS/INR WAIVED
INR: 3.6 — ABNORMAL HIGH (ref 0.9–1.1)
Prothrombin Time: 42.7 s

## 2020-09-04 LAB — POCT INR: INR: 3.6 — AB (ref 2–3)

## 2020-09-04 MED ORDER — POTASSIUM CHLORIDE ER 10 MEQ PO TBCR: 30.0000 meq | EXTENDED_RELEASE_TABLET | Freq: Every day | ORAL | 3 refills | Status: DC

## 2020-09-04 NOTE — Patient Instructions (Signed)
What You Need to Know About Warfarin Warfarin is a blood thinner (anticoagulant). Anticoagulants help to prevent the formation of blood clots. They also help to stop the growth of blood clots. Who should use warfarin? Warfarin is prescribed for people who are at risk for developing harmful blood clots, such as people who have:  Surgically implanted mechanical heart valves.  Irregular heart rhythms (atrial fibrillation).  Certain clotting disorders.  A history of harmful blood clotting in the past. This includes people who have had: ? A stroke. ? Blood clot in the lungs (pulmonary embolism, or PE). ? Blood clot in the legs (deep vein thrombosis, or DVT).  An existing blood clot. How is warfarin taken?  Warfarin is a medicine that you take by mouth (orally). Warfarin tablets come in different strengths. Each tablet strength is a different color, with the amount of warfarin printed on the tablet. If you get a new prescription filled and the color of your tablet is different than usual, tell your pharmacist or health care provider immediately. What blood tests do I need while taking warfarin? The goal of warfarin therapy is to lessen the clotting tendency of blood, but not to prevent clotting completely. Your health care provider will monitor the anticoagulation effect of warfarin closely and will adjust your dose as needed. Warfarin is a medicine that needs to be closely monitored, so it is very important to keep all lab visits and follow-up visits with your health care provider. While taking warfarin, you will need to have blood tests (prothrombin tests, or PT tests) regularly to measure your blood clotting time. This type of test can be done with a finger stick or a blood draw. What does the INR test result mean? The PT test results will be reported as the International Normalized Ratio (INR). The INR tells your health care provider whether your dosage of warfarin needs to be changed. The  longer it takes your blood to clot, the higher the INR. Your health care provider will tell you your target INR range. If your INR is not in your target range, your health care provider may adjust your dosage.  If your INR is above your target range, there is a risk of bleeding. Your dosage of warfarin may need to be decreased.  If your INR is below your target range, there is a risk of clotting. Your dosage of warfarin may need to be increased. How often is the INR test needed?  When you first start warfarin, you will usually have your INR checked every few days.  You may need to have INR tests done more than once a week until you are taking the correct dosage of warfarin.  After you have reached your target INR, your INR will be tested less often. However, you will need to have your INR checked at least once every 4-6 weeks for the entire time you are taking warfarin. What are the side effects of warfarin? Too much warfarin can cause bleeding (hemorrhage) in any part of the body, such as:  Bleeding from the gums.  Unexplained bruises.  Bruises that get larger.  Blood in the urine.  Bloody or dark stools.  Bleeding in the brain (hemorrhagic stroke).  A nosebleed that is not easily stopped.  Coughing up blood.  Vomiting blood. Warfarin use may also cause:  Skin rash or irritations  Nausea that does not go away.  Severe pain in the back or joints.  Painful toes that turn blue or purple (purple  toe syndrome).  Painful ulcers that do not go away (skin necrosis). What are the signs and symptoms of a blood clot? Too little warfarin can increase the risk of blood clots in your legs, lungs, or arms. Signs and symptoms of a DVT in your leg or arm may include:  Pain or swelling in your leg or arm.  Skin that is red or warm to the touch on your arm or leg. Signs and symptoms of a pulmonary embolism may include:  Shortness of breath or difficulty breathing.  Chest  pain.  Unexplained fever. What are the signs and symptoms of a stroke? If you are taking too much or too little warfarin, you can have a stroke. Signs and symptoms of a stroke may include:  Weakness or numbness of your face, arm, or leg, especially on one side of your body.  Confusion or trouble thinking clearly.  Difficulty seeing with one or both eyes.  Difficulty walking or moving your arms or legs.  Dizziness.  Loss of balance or coordination.  Trouble speaking, trouble understanding speech, or both (aphasia).  Sudden, severe headache with no known cause.  Partial or total loss of consciousness. What precautions do I need to take while using warfarin?  Take warfarin exactly as told by your health care provider. Doing this helps you avoid bleeding or blood clots that could result in serious injury, pain, or disability.  Take your medicine at the same time every day. If you forget to take your dose of warfarin, take it as soon as you remember that day. If you do not remember on that day, do not take an extra dose the next day.  Contact your health care provider if you miss or take an extra dose. Do not change your dosage on your own to make up for missed or extra doses.  Wear or carry identification that says that you are taking warfarin.  Make sure that all health care providers, including your dentist, know you are taking warfarin.  If you need surgery, talk with your health care provider about whether you should stop taking warfarin before your surgery.  Avoid situations that cause bleeding. You may bleed more easily while taking warfarin. To limit bleeding, take the following actions: ? Use a softer toothbrush. ? Floss with waxed floss, not unwaxed floss. ? Shave with an electric razor, not with a blade. ? Limit your use of sharp objects. ? Avoid potentially harmful activities, such as contact sports. What do I need to know about warfarin and pregnancy or  breastfeeding?  Warfarin is not recommended during the first trimester of pregnancy due to an increased risk of birth defects. In certain situations, a woman may take warfarin after her first trimester of pregnancy.  If you are taking warfarin and you become pregnant or plan to become pregnant, contact your health care provider right away.  If you plan to breastfeed while taking warfarin, talk with your health care provider first. What do I need to know about warfarin and alcohol or drug use?  Avoid drinking alcohol, or limit alcohol intake to no more than 1 drink a day for nonpregnant women and 2 drinks a day for men. One drink equals 12 oz of beer, 5 oz of wine, or 1 oz of hard liquor. ? If you change the amount of alcohol that you drink, tell your health care provider. Your warfarin dosage may need to be changed.  Avoid tobacco products, such as cigarettes, chewing tobacco, and e-cigarettes.  If you need help quitting, ask your health care provider. ? If you change the amount of nicotine or tobacco that you use, tell your health care provider. Your warfarin dosage may need to be changed.  Avoid street drugs while taking warfarin. The effects of street drugs on warfarin are not known. What do I need to know about warfarin and other medicines or supplements?  Many prescription and over-the-counter medicines can interfere with warfarin. Talk with your health care provider or your pharmacist before starting or stopping any new medicines. This includes over-the-counter vitamins, dietary supplements, herbal medicines, and pain medicines. Your warfarin dosage may need to be adjusted.  Some common over-the-counter medicines that may increase the risk of bleeding while taking warfarin include: ? Acetaminophen. ? Aspirin. ? NSAIDs, such as ibuprofen or naproxen. ? Vitamin E. What do I need to know about warfarin and my diet?  It is important to maintain a normal, balanced diet while taking  warfarin. Avoid major changes in your diet. If you are going to change your diet, talk with your health care provider before making changes.  Your health care provider may recommend that you work with a diet and nutrition specialist (dietitian).  Vitamin K decreases the effect of warfarin, and it is found in many foods. Eat a consistent amount of foods that contain vitamin K. For example, you may decide to eat 2 vitamin K-containing foods each day. Most foods that are high in vitamin K are green and leafy. Common foods that contain high amounts of vitamin K include:  Kale, raw or cooked.  Spinach, raw or cooked.  Collards, raw or cooked.  Swiss chard, raw or cooked.  Mustard greens, raw or cooked.  Turnip greens, raw or cooked.  Parsley, raw.  Broccoli, cooked.  Noodles, eggs, and spinach, enriched.  Brussels sprouts, raw or cooked.  Beet greens, raw or cooked.  Endive, raw.  Cabbage, cooked.  Asparagus, cooked. Foods that contain moderate amounts of vitamin K include:  Broccoli, raw.  Cabbage, raw.  Bok choy, cooked.  Green leaf lettuce, raw  Prunes, stewed.  Angie Fava.  Kiwi.  Edamame, cooked.  Romaine lettuce, raw.  Avocado.  Tuna, canned in oil.  Okra, cooked.  Black-eyed peas, cooked.  Green beans, cooked or raw.  Blueberries, raw.  Blackberries, raw.  Peas, cooked or raw. Contact a health care provider if:  You miss a dose.  You take an extra dose.  You plan to have any kind of surgery or procedure.  You are unable to take your medicine due to nausea, vomiting, or diarrhea.  You have any major changes in your diet or you plan to make any major changes in your diet.  You start or stop any over-the-counter medicine, prescription medicine, or dietary supplement.  You become pregnant, plan to become pregnant, or think you may be pregnant.  You have menstrual periods that are heavier than usual.  You have unusual bruising. Get  help right away if:  You develop symptoms of an allergic reaction, such as: ? Swelling of the lips, face, tongue, mouth, or throat. ? Rash. ? Itching. ? Itchy, red, swollen areas of skin (hives). ? Trouble breathing. ? Chest tightness.  You have: ? Signs or symptoms of a stroke. ? Signs or symptoms of a blood clot. ? A fall or have an accident, especially if you hit your head. ? Blood in your urine. Your urine may look reddish, pinkish, or tea-colored. ? Blood in your stool. Your stool  may be black or bright red. ? Bleeding that does not stop after applying pressure to the area for 30 minutes. ? Severe pain in your joints or back. ? Purple or blue toes. ? Skin ulcers that do not go away.  You vomit blood or cough up blood. The blood may be bright red, or it may look like coffee grounds. These symptoms may represent a serious problem that is an emergency. Do not wait to see if the symptoms will go away. Get medical help right away. Call your local emergency services (911 in the [place] Do not drive yourself to the hospital. Summary  Warfarin needs to be closely monitored with blood tests. It is very important to keep all lab visits and follow-up visits with your health care provider.  Make sure that you know your target INR range and your warfarin dosage.  Wear or carry identification that says that you are taking warfarin.  Take warfarin at the same time every day. Call your health care provider if you miss a dose or if you take an extra dose. Do not change the dosage of warfarin on your own.  Know the signs and symptoms of blood clots, bleeding, and a stroke. Know when to get emergency medical help.  Tell all health care providers who care for you that you are taking warfarin.  Talk with your health care provider or your pharmacist before starting or stopping any new medicines.  Monitor how much vitamin K you eat every day. Try to eat the same amount every day. This  information is not intended to replace advice given to you by your health care provider. Make sure you discuss any questions you have with your health care provider. Document Revised: 06/07/2017 Document Reviewed: 01/21/2016 Elsevier Patient Education  [place].

## 2020-09-04 NOTE — Progress Notes (Signed)
Subjective: CC: chronic anticoagulation PCP: Loman Brooklyn, FNP  MBW:GYKZ H Gilroy is a 79 y.o. female presenting to clinic today for:  1. Chronic anticoagulation Current regimen: 1.5 mg on sundays, tuesdays, fridays. 3 mg all other days.    Indication: Bleeding Signs/Symptoms:  no Thromboembolic Signs/Symptoms:  no   Missed Coumadin Doses: 1 as instructed Medication Changes:  no Dietary Changes:  no Bacterial/Viral Infection:  no  2. HTN Bren saw Dr. Percival Spanish after her visit last week. Her BP was at goal at that visit. He agreed with the increase in Lisinopril to 10 mg and did not make any other changes. She was instructed to follow up in 6 months. She brought in a BP log today. Home BP readings are mostly 120-130s/90s. She denies chest pain, shortness of breath, new fatigue, headaches, dizziness, or lightheadedness.   3. Back pain Janiya has chronic back pain from OA. She takes tylenol for the pain. She has muscle relaxers but does not take these are they don't provide much relief. The pain comes and goes and is an 8/10 at worse. She denies new injury, fall, changes in bowel or bladder control, or numbness/tingling in feet or legs. She would like to see a chiropractor for her back pain.   4. Hypokalemia She was not able to pick up the potassium supplement that was sent in last week. She denies muscle pain, palpitations, new weakness or fatigue.   Relevant past medical, surgical, family, and social history reviewed and updated as indicated.  Allergies and medications reviewed and updated.  Allergies  Allergen Reactions  . Codeine Nausea And Vomiting  . Doxycycline Other (See Comments)    Chest congestion  . Triamterene-Hctz Other (See Comments)    weakness  . Atorvastatin Other (See Comments)    Myalgias   . Crestor [Rosuvastatin Calcium] Other (See Comments)    weakness  . Morphine Nausea Only  . Risedronate Sodium Other (See Comments)    ACTONEL - reflux   Past  Medical History:  Diagnosis Date  . 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 Anxiety   . Arthritis    "right leg" (04/11/2015)  . BPPV (benign paroxysmal positional vertigo) 03/23/2016  . Cataract   . Coronary atherosclerosis of native coronary artery    a. Nonobstructive minimal CAD 10/2005.  Marland Kitchen Depression   . Diastolic dysfunction    Grade 1. Ejection fraction 60-65%.  Marland Kitchen Dysrhythmia    a fib  . Erosive esophagitis   . Essential hypertension   . GERD (gastroesophageal reflux disease)   . Grade III hemorrhoids   . History of hiatal hernia   . Hypercholesterolemia   . Internal hemorrhoids with complication 9/93/5701   OCT 2015 FLEX SIG/IH BANDING    . Migraine    "used to have them right bad; I don't now" (04/11/2015)  . MITRAL REGURGITATION 04/27/2010   Qualifier: Diagnosis of  By: [name], MD, [name]   . Osteoporosis   . Paroxysmal atrial fibrillation (HCC)   . PONV (postoperative nausea and vomiting)   . Vertigo     Current Outpatient Medications:  .  acetaminophen (TYLENOL) 500 MG tablet, Take 1,000 mg by mouth 2 (two) times daily. , Disp: , Rfl:  .  amiodarone (PACERONE) 200 MG tablet, Take 1 tablet (200 mg total) by mouth daily., Disp: 90 tablet, Rfl: 1 .  amLODipine (NORVASC) 10 MG tablet, TAKE 1 TABLET EVERY DAY, Disp: 90 tablet, Rfl: 0 .  Calcium Carbonate-Vitamin D (CALCIUM-VITAMIN D3 PO), Take 1 tablet by mouth daily., Disp: , Rfl:  .  cholecalciferol (VITAMIN D3) 25 MCG (1000 UT) tablet, Take 1,000 Units by mouth daily., Disp: , Rfl:  .  furosemide (LASIX) 40 MG tablet, Take 1 tablet (40 mg total) by mouth daily., Disp: 90 tablet, Rfl: 2 .  lisinopril (ZESTRIL) 10 MG tablet, Take 1 tablet (10 mg total) by mouth daily., Disp: 90 tablet, Rfl: 0 .  loratadine (CLARITIN) 10 MG tablet, Take 10 mg by mouth daily as needed for allergies., Disp: , Rfl:  .  metoprolol tartrate (LOPRESSOR) 50 MG tablet, Take 1 tablet (50 mg total) by mouth 2 (two) times  daily., Disp: 180 tablet, Rfl: 3 .  Multiple Vitamin (MULTIVITAMIN) capsule, Take 1 capsule by mouth every evening., Disp: , Rfl:  .  Omega-3 Fatty Acids (FISH OIL) 1000 MG CAPS, Take 1,000 mg by mouth 2 (two) times daily. , Disp: , Rfl:  .  omeprazole (PRILOSEC) 20 MG capsule, Take 1 capsule (20 mg total) by mouth daily., Disp: 90 capsule, Rfl: 3 .  simvastatin (ZOCOR) 20 MG tablet, Take 1 tablet (20 mg total) by mouth daily., Disp: 90 tablet, Rfl: 1 .  warfarin (COUMADIN) 3 MG tablet, TAKE 1/2 TO 1 TABLET EVERY DAY AS DIRECTED BY  ANTICOAGULATION CLINIC, Disp: 90 tablet, Rfl: 0 .  potassium chloride (KLOR-CON) 10 MEQ tablet, Take 3 tablets (30 mEq total) by mouth daily with breakfast., Disp: 60 tablet, Rfl: 3 Social History   Socioeconomic History  . Marital status: Married    Spouse name: george   . Number of children: 1  . Years of education: Not on file  . Highest education level: Not on file  Occupational History  . Occupation: Retired    Comment: Textile  Tobacco Use  . Smoking status: Never Smoker  . Smokeless tobacco: Never Used  Vaping Use  . Vaping Use: Never used  Substance and Sexual Activity  . Alcohol use: No    Alcohol/week: 0.0 standard drinks  . Drug use: No  . Sexual activity: Not Currently    Birth control/protection: Surgical, Post-menopausal    Comment: hyst  Other Topics Concern  . Not on file  Social History Narrative   Married   No regular exercise   Social Determinants of Health   Financial Resource Strain:   . Difficulty of Paying Living Expenses: Not on file  Food Insecurity:   . Worried About Charity fundraiser in the Last Year: Not on file  . Ran Out of Food in the Last Year: Not on file  Transportation Needs:   . Lack of Transportation (Medical): Not on file  . Lack of Transportation (Non-Medical): Not on file  Physical Activity:   . Days of Exercise per Week: Not on file  . Minutes of Exercise per Session: Not on file  Stress:   .  Feeling of Stress : Not on file  Social Connections:   . Frequency of Communication with Friends and Family: Not on file  . Frequency of Social Gatherings with Friends and Family: Not on file  . Attends Religious Services: Not on file  . Active Member of Clubs or Organizations: Not on file  . Attends Archivist Meetings: Not on file  . Marital Status: Not on file  Intimate Partner Violence:   . Fear of Current or Ex-Partner: Not on file  . Emotionally Abused: Not on file  . Physically Abused: Not on  file  . Sexually Abused: Not on file   Family History  Problem Relation Age of Onset  . Colon cancer Mother 1  . Heart disease Mother   . Osteoporosis Mother   . Hip fracture Mother   . Stroke Sister   . Diabetes Sister   . Osteoporosis Sister   . Arthritis Sister   . Uterine cancer Sister   . Stroke Sister   . Heart disease Father   . Hyperlipidemia Brother   . Hypertension Brother   . Heart disease Brother   . Stroke Brother   . Heart disease Sister   . Dementia Sister   . Diabetes Son   . Stroke Son   . Heart attack Neg Hx     Review of Systems  Per HPI.  Objective: Office vital signs reviewed. BP (!) 134/101   Pulse (!) 107   Temp 97.7 F (36.5 C) (Temporal)   Ht $R'5\' 1"'Qd$  (1.549 m)   Wt 121 lb 2 oz (54.9 kg)   BMI 22.89 kg/m   Physical Examination:  Physical Exam Vitals and nursing note reviewed.  Constitutional:      General: She is not in acute distress.    Appearance: She is not ill-appearing or diaphoretic.  Cardiovascular:     Rate and Rhythm: Rhythm regularly irregular.     Heart sounds: Normal heart sounds. No murmur heard.   Pulmonary:     Effort: Pulmonary effort is normal. No respiratory distress.     Breath sounds: Normal breath sounds.  Musculoskeletal:     Right lower leg: No edema.     Left lower leg: No edema.  Skin:    General: Skin is warm and dry.  Neurological:     General: No focal deficit present.     Mental Status:  She is alert and oriented to person, place, and time.  Psychiatric:        Mood and Affect: Mood normal.        Behavior: Behavior normal.      Results for orders placed or performed in visit on 09/04/20  POCT INR  Result Value Ref Range   INR 3.6 (A) 2.0 - 3.0     Assessment/ Plan: Angellina was seen today for atrial fibrillation.  Diagnoses and all orders for this visit:  Chronic anticoagulation INR 3.6 today, decreased from 3.9 last week. Goal is 2-3. Decrease  regimen to 1.5 mg on Tuesday, Thursday, Friday, Sunday. 3 mg on Monday, Wednesday, Saturday. Repeat INR in 1 week.  -     POCT INR  Hypokalemia Labs pending as below. Supplement reordered. Instructed patient to notify office if there are any issues with the prescription.  -     BMP8+EGFR -     potassium chloride (KLOR-CON) 10 MEQ tablet; Take 3 tablets (30 mEq total) by mouth daily with breakfast.  Osteoarthritis of thoracolumbar spine, unspecified spinal osteoarthritis complication status Continue tylenol. Apply heat. Can use muscle rubs and muscle relaxer as needed. Referral to chiropractor as requested.  -     Ambulatory referral to Chiropractic  Essential Hypertension BP better controlled than last week. Home BP readings are controlled.  Continue current regimen.   Follow up in 1 week with Almyra Free for INR. Will order CMP for next week if needed.   The above assessment and management plan was discussed with the patient. The patient verbalized understanding of and has agreed to the management plan. Patient is aware to call the clinic if  symptoms persist or worsen. Patient is aware when to return to the clinic for a follow-up visit. Patient educated on when it is appropriate to go to the emergency department.   Marjorie Smolder, FNP-C [facility] [address] [place], [place] [address] [phone]

## 2020-09-05 ENCOUNTER — Telehealth: Payer: Self-pay

## 2020-09-05 NOTE — Telephone Encounter (Signed)
REFERRAL REQUEST Telephone Note  Have you been seen at our office for this problem? yes (Advise that they may need an appointment with their PCP before a referral can be done)  Reason for Referral: back pain-wants shot Referral discussed with patient: yes Best contact number of patient for referral team: [phone] Has patient been seen by a specialist for this issue before: no Patient provider preference for referral: Back Specialist Patient location preference for referral: [place]   Patient notified that referrals can take up to a week or longer to process. If they haven't heard anything within a week they should call back and speak with the referral department.   Pt seen Marjorie Smolder yesterday.  Please call pt.

## 2020-09-07 ENCOUNTER — Other Ambulatory Visit: Payer: Self-pay

## 2020-09-07 ENCOUNTER — Encounter (HOSPITAL_COMMUNITY): Payer: Self-pay

## 2020-09-07 ENCOUNTER — Emergency Department (HOSPITAL_COMMUNITY): Payer: Medicare HMO

## 2020-09-07 ENCOUNTER — Inpatient Hospital Stay (HOSPITAL_COMMUNITY)
Admission: EM | Admit: 2020-09-07 | Discharge: 2020-09-12 | DRG: 441 | Disposition: A | Discharge status: Home Health | Payer: Medicare HMO | Attending: Internal Medicine | Admitting: Internal Medicine

## 2020-09-07 DIAGNOSIS — Z83438 Family history of other disorder of lipoprotein metabolism and other lipidemia: Secondary | ICD-10-CM

## 2020-09-07 DIAGNOSIS — Z66 Do not resuscitate: Secondary | ICD-10-CM | POA: Diagnosis not present

## 2020-09-07 DIAGNOSIS — I5082 Biventricular heart failure: Secondary | ICD-10-CM | POA: Diagnosis present

## 2020-09-07 DIAGNOSIS — Z90710 Acquired absence of both cervix and uterus: Secondary | ICD-10-CM | POA: Diagnosis not present

## 2020-09-07 DIAGNOSIS — Z20822 Contact with and (suspected) exposure to covid-19: Secondary | ICD-10-CM | POA: Diagnosis not present

## 2020-09-07 DIAGNOSIS — K810 Acute cholecystitis: Secondary | ICD-10-CM | POA: Diagnosis not present

## 2020-09-07 DIAGNOSIS — K8050 Calculus of bile duct without cholangitis or cholecystitis without obstruction: Secondary | ICD-10-CM | POA: Diagnosis not present

## 2020-09-07 DIAGNOSIS — M810 Age-related osteoporosis without current pathological fracture: Secondary | ICD-10-CM | POA: Diagnosis present

## 2020-09-07 DIAGNOSIS — E876 Hypokalemia: Secondary | ICD-10-CM | POA: Diagnosis present

## 2020-09-07 DIAGNOSIS — I482 Chronic atrial fibrillation, unspecified: Secondary | ICD-10-CM | POA: Diagnosis present

## 2020-09-07 DIAGNOSIS — Z881 Allergy status to other antibiotic agents status: Secondary | ICD-10-CM

## 2020-09-07 DIAGNOSIS — K224 Dyskinesia of esophagus: Secondary | ICD-10-CM | POA: Diagnosis not present

## 2020-09-07 DIAGNOSIS — K819 Cholecystitis, unspecified: Secondary | ICD-10-CM | POA: Diagnosis not present

## 2020-09-07 DIAGNOSIS — G8929 Other chronic pain: Secondary | ICD-10-CM | POA: Diagnosis present

## 2020-09-07 DIAGNOSIS — I2729 Other secondary pulmonary hypertension: Secondary | ICD-10-CM | POA: Diagnosis present

## 2020-09-07 DIAGNOSIS — Z885 Allergy status to narcotic agent status: Secondary | ICD-10-CM

## 2020-09-07 DIAGNOSIS — Z79899 Other long term (current) drug therapy: Secondary | ICD-10-CM

## 2020-09-07 DIAGNOSIS — I2781 Cor pulmonale (chronic): Secondary | ICD-10-CM | POA: Diagnosis present

## 2020-09-07 DIAGNOSIS — Q394 Esophageal web: Secondary | ICD-10-CM

## 2020-09-07 DIAGNOSIS — R52 Pain, unspecified: Secondary | ICD-10-CM | POA: Diagnosis not present

## 2020-09-07 DIAGNOSIS — Z9841 Cataract extraction status, right eye: Secondary | ICD-10-CM | POA: Diagnosis not present

## 2020-09-07 DIAGNOSIS — Z8261 Family history of arthritis: Secondary | ICD-10-CM

## 2020-09-07 DIAGNOSIS — R1084 Generalized abdominal pain: Secondary | ICD-10-CM | POA: Diagnosis not present

## 2020-09-07 DIAGNOSIS — I2510 Atherosclerotic heart disease of native coronary artery without angina pectoris: Secondary | ICD-10-CM | POA: Diagnosis present

## 2020-09-07 DIAGNOSIS — R0902 Hypoxemia: Secondary | ICD-10-CM | POA: Diagnosis not present

## 2020-09-07 DIAGNOSIS — R1314 Dysphagia, pharyngoesophageal phase: Secondary | ICD-10-CM | POA: Diagnosis not present

## 2020-09-07 DIAGNOSIS — R0689 Other abnormalities of breathing: Secondary | ICD-10-CM | POA: Diagnosis not present

## 2020-09-07 DIAGNOSIS — F419 Anxiety disorder, unspecified: Secondary | ICD-10-CM | POA: Diagnosis present

## 2020-09-07 DIAGNOSIS — Z8719 Personal history of other diseases of the digestive system: Secondary | ICD-10-CM | POA: Diagnosis not present

## 2020-09-07 DIAGNOSIS — I480 Paroxysmal atrial fibrillation: Secondary | ICD-10-CM | POA: Diagnosis present

## 2020-09-07 DIAGNOSIS — E785 Hyperlipidemia, unspecified: Secondary | ICD-10-CM | POA: Diagnosis present

## 2020-09-07 DIAGNOSIS — K219 Gastro-esophageal reflux disease without esophagitis: Secondary | ICD-10-CM | POA: Diagnosis present

## 2020-09-07 DIAGNOSIS — I5031 Acute diastolic (congestive) heart failure: Secondary | ICD-10-CM | POA: Diagnosis not present

## 2020-09-07 DIAGNOSIS — K761 Chronic passive congestion of liver: Secondary | ICD-10-CM | POA: Diagnosis not present

## 2020-09-07 DIAGNOSIS — R531 Weakness: Secondary | ICD-10-CM | POA: Diagnosis present

## 2020-09-07 DIAGNOSIS — I4891 Unspecified atrial fibrillation: Secondary | ICD-10-CM | POA: Diagnosis not present

## 2020-09-07 DIAGNOSIS — J9811 Atelectasis: Secondary | ICD-10-CM | POA: Diagnosis not present

## 2020-09-07 DIAGNOSIS — Z961 Presence of intraocular lens: Secondary | ICD-10-CM | POA: Diagnosis present

## 2020-09-07 DIAGNOSIS — I110 Hypertensive heart disease with heart failure: Secondary | ICD-10-CM | POA: Diagnosis present

## 2020-09-07 DIAGNOSIS — R1319 Other dysphagia: Secondary | ICD-10-CM

## 2020-09-07 DIAGNOSIS — F32A Depression, unspecified: Secondary | ICD-10-CM | POA: Diagnosis present

## 2020-09-07 DIAGNOSIS — Z7901 Long term (current) use of anticoagulants: Secondary | ICD-10-CM

## 2020-09-07 DIAGNOSIS — Z888 Allergy status to other drugs, medicaments and biological substances status: Secondary | ICD-10-CM

## 2020-09-07 DIAGNOSIS — I083 Combined rheumatic disorders of mitral, aortic and tricuspid valves: Secondary | ICD-10-CM | POA: Diagnosis present

## 2020-09-07 DIAGNOSIS — Z9842 Cataract extraction status, left eye: Secondary | ICD-10-CM

## 2020-09-07 DIAGNOSIS — I700 Atherosclerosis of aorta: Secondary | ICD-10-CM | POA: Diagnosis not present

## 2020-09-07 DIAGNOSIS — I10 Essential (primary) hypertension: Secondary | ICD-10-CM | POA: Diagnosis not present

## 2020-09-07 DIAGNOSIS — Z8262 Family history of osteoporosis: Secondary | ICD-10-CM

## 2020-09-07 DIAGNOSIS — J90 Pleural effusion, not elsewhere classified: Secondary | ICD-10-CM | POA: Diagnosis not present

## 2020-09-07 DIAGNOSIS — E782 Mixed hyperlipidemia: Secondary | ICD-10-CM | POA: Diagnosis not present

## 2020-09-07 DIAGNOSIS — I50811 Acute right heart failure: Secondary | ICD-10-CM | POA: Diagnosis not present

## 2020-09-07 DIAGNOSIS — R1011 Right upper quadrant pain: Secondary | ICD-10-CM | POA: Diagnosis not present

## 2020-09-07 DIAGNOSIS — D333 Benign neoplasm of cranial nerves: Secondary | ICD-10-CM | POA: Diagnosis present

## 2020-09-07 DIAGNOSIS — R188 Other ascites: Secondary | ICD-10-CM | POA: Diagnosis not present

## 2020-09-07 DIAGNOSIS — Z8249 Family history of ischemic heart disease and other diseases of the circulatory system: Secondary | ICD-10-CM

## 2020-09-07 DIAGNOSIS — M1909 Primary osteoarthritis, other specified site: Secondary | ICD-10-CM | POA: Diagnosis present

## 2020-09-07 LAB — CBC WITH DIFFERENTIAL/PLATELET
Abs Immature Granulocytes: 0.02 10*3/uL (ref 0.00–0.07)
Basophils Absolute: 0.1 10*3/uL (ref 0.0–0.1)
Basophils Relative: 1 %
Eosinophils Absolute: 0.1 10*3/uL (ref 0.0–0.5)
Eosinophils Relative: 1 %
HCT: 50.1 % — ABNORMAL HIGH (ref 36.0–46.0)
Hemoglobin: 16.1 g/dL — ABNORMAL HIGH (ref 12.0–15.0)
Immature Granulocytes: 0 %
Lymphocytes Relative: 16 %
Lymphs Abs: 1 10*3/uL (ref 0.7–4.0)
MCH: 30.7 pg (ref 26.0–34.0)
MCHC: 32.1 g/dL (ref 30.0–36.0)
MCV: 95.4 fL (ref 80.0–100.0)
Monocytes Absolute: 0.4 10*3/uL (ref 0.1–1.0)
Monocytes Relative: 7 %
Neutro Abs: 4.5 10*3/uL (ref 1.7–7.7)
Neutrophils Relative %: 75 %
Platelets: 183 10*3/uL (ref 150–400)
RBC: 5.25 MIL/uL — ABNORMAL HIGH (ref 3.87–5.11)
RDW: 18.5 % — ABNORMAL HIGH (ref 11.5–15.5)
WBC: 6.1 10*3/uL (ref 4.0–10.5)
nRBC: 0 % (ref 0.0–0.2)

## 2020-09-07 LAB — COMPREHENSIVE METABOLIC PANEL
ALT: 19 U/L (ref 0–44)
AST: 29 U/L (ref 15–41)
Albumin: 3.6 g/dL (ref 3.5–5.0)
Alkaline Phosphatase: 66 U/L (ref 38–126)
Anion gap: 13 (ref 5–15)
BUN: 16 mg/dL (ref 8–23)
CO2: 27 mmol/L (ref 22–32)
Calcium: 8.6 mg/dL — ABNORMAL LOW (ref 8.9–10.3)
Chloride: 98 mmol/L (ref 98–111)
Creatinine, Ser: 1.09 mg/dL — ABNORMAL HIGH (ref 0.44–1.00)
GFR, Estimated: 51 mL/min — ABNORMAL LOW (ref >60)
Glucose, Bld: 162 mg/dL — ABNORMAL HIGH (ref 70–99)
Potassium: 3 mmol/L — ABNORMAL LOW (ref 3.5–5.1)
Sodium: 138 mmol/L (ref 135–145)
Total Bilirubin: 1.3 mg/dL — ABNORMAL HIGH (ref 0.3–1.2)
Total Protein: 6.6 g/dL (ref 6.5–8.1)

## 2020-09-07 LAB — TROPONIN I (HIGH SENSITIVITY)
Troponin I (High Sensitivity): 7 ng/L (ref <18)
Troponin I (High Sensitivity): 14 ng/L (ref <18)

## 2020-09-07 LAB — RESPIRATORY PANEL BY RT PCR (FLU A&B, COVID)
Influenza A by PCR: NEGATIVE
Influenza B by PCR: NEGATIVE
SARS Coronavirus 2 by RT PCR: NEGATIVE

## 2020-09-07 LAB — LACTIC ACID, PLASMA
Lactic Acid, Venous: 2.3 mmol/L (ref 0.5–1.9)
Lactic Acid, Venous: 2.1 mmol/L (ref 0.5–1.9)

## 2020-09-07 LAB — LIPASE, BLOOD: Lipase: 24 U/L (ref 11–51)

## 2020-09-07 LAB — PROTIME-INR
INR: 2.9 — ABNORMAL HIGH (ref 0.8–1.2)
Prothrombin Time: 29.2 seconds — ABNORMAL HIGH (ref 11.4–15.2)

## 2020-09-07 LAB — MAGNESIUM: Magnesium: 1.7 mg/dL (ref 1.7–2.4)

## 2020-09-07 MED ORDER — PIPERACILLIN-TAZOBACTAM 3.375 G IVPB 30 MIN
3.3750 g | Freq: Once | INTRAVENOUS | Status: AC
  Administered 2020-09-07: 3.375 g via INTRAVENOUS
  Filled 2020-09-07: qty 50

## 2020-09-07 MED ORDER — ACETAMINOPHEN 650 MG RE SUPP: 650.0000 mg | Freq: Four times a day (QID) | RECTAL | Status: DC | PRN

## 2020-09-07 MED ORDER — LABETALOL HCL 5 MG/ML IV SOLN
20.0000 mg | Freq: Once | INTRAVENOUS | Status: DC
  Filled 2020-09-07: qty 4

## 2020-09-07 MED ORDER — ONDANSETRON HCL 4 MG/2ML IJ SOLN
4.0000 mg | Freq: Once | INTRAMUSCULAR | Status: AC
  Administered 2020-09-07: 4 mg via INTRAVENOUS
  Filled 2020-09-07: qty 2

## 2020-09-07 MED ORDER — PANTOPRAZOLE SODIUM 40 MG PO TBEC
40.0000 mg | DELAYED_RELEASE_TABLET | Freq: Every day | ORAL | Status: DC
  Administered 2020-09-07 – 2020-09-12 (×6): 40 mg via ORAL
  Filled 2020-09-07 (×6): qty 1

## 2020-09-07 MED ORDER — IOHEXOL 300 MG/ML  SOLN
75.0000 mL | Freq: Once | INTRAMUSCULAR | Status: AC | PRN
  Administered 2020-09-07: 75 mL via INTRAVENOUS

## 2020-09-07 MED ORDER — ONDANSETRON HCL 4 MG/2ML IJ SOLN: 4.0000 mg | Freq: Four times a day (QID) | INTRAMUSCULAR | Status: DC | PRN

## 2020-09-07 MED ORDER — FENTANYL CITRATE (PF) 100 MCG/2ML IJ SOLN
50.0000 ug | Freq: Once | INTRAMUSCULAR | Status: AC
  Administered 2020-09-07: 50 ug via INTRAVENOUS
  Filled 2020-09-07: qty 2

## 2020-09-07 MED ORDER — AMIODARONE HCL 200 MG PO TABS
200.0000 mg | ORAL_TABLET | Freq: Every day | ORAL | Status: DC
  Administered 2020-09-07 – 2020-09-12 (×6): 200 mg via ORAL
  Filled 2020-09-07 (×6): qty 1

## 2020-09-07 MED ORDER — METOPROLOL TARTRATE 50 MG PO TABS
50.0000 mg | ORAL_TABLET | Freq: Two times a day (BID) | ORAL | Status: DC
  Administered 2020-09-07 – 2020-09-11 (×8): 50 mg via ORAL
  Filled 2020-09-07 (×8): qty 1

## 2020-09-07 MED ORDER — ONDANSETRON HCL 4 MG PO TABS: 4.0000 mg | ORAL_TABLET | Freq: Four times a day (QID) | ORAL | Status: DC | PRN

## 2020-09-07 MED ORDER — POTASSIUM CHLORIDE CRYS ER 20 MEQ PO TBCR
40.0000 meq | EXTENDED_RELEASE_TABLET | Freq: Once | ORAL | Status: AC
  Administered 2020-09-07: 40 meq via ORAL
  Filled 2020-09-07: qty 2

## 2020-09-07 MED ORDER — FENTANYL CITRATE (PF) 100 MCG/2ML IJ SOLN
50.0000 ug | INTRAMUSCULAR | Status: DC | PRN
  Administered 2020-09-07 – 2020-09-09 (×3): 50 ug via INTRAVENOUS
  Filled 2020-09-07 (×3): qty 2

## 2020-09-07 MED ORDER — SODIUM CHLORIDE 0.9 % IV BOLUS
500.0000 mL | Freq: Once | INTRAVENOUS | Status: AC
  Administered 2020-09-07: 500 mL via INTRAVENOUS

## 2020-09-07 MED ORDER — AMLODIPINE BESYLATE 5 MG PO TABS
10.0000 mg | ORAL_TABLET | Freq: Every day | ORAL | Status: DC
  Administered 2020-09-07 – 2020-09-12 (×6): 10 mg via ORAL
  Filled 2020-09-07 (×6): qty 2

## 2020-09-07 MED ORDER — METOPROLOL TARTRATE 5 MG/5ML IV SOLN
5.0000 mg | Freq: Once | INTRAVENOUS | Status: AC
  Administered 2020-09-07: 5 mg via INTRAVENOUS
  Filled 2020-09-07: qty 5

## 2020-09-07 MED ORDER — PIPERACILLIN-TAZOBACTAM 3.375 G IVPB
3.3750 g | Freq: Three times a day (TID) | INTRAVENOUS | Status: DC
  Administered 2020-09-07 – 2020-09-08 (×2): 3.375 g via INTRAVENOUS
  Filled 2020-09-07 (×2): qty 50

## 2020-09-07 MED ORDER — POTASSIUM CHLORIDE 10 MEQ/100ML IV SOLN
10.0000 meq | INTRAVENOUS | Status: DC
  Administered 2020-09-07 (×2): 10 meq via INTRAVENOUS
  Filled 2020-09-07 (×2): qty 100

## 2020-09-07 MED ORDER — SODIUM CHLORIDE 0.9 % IV SOLN
INTRAVENOUS | Status: DC | PRN
  Administered 2020-09-07: 500 mL via INTRAVENOUS

## 2020-09-07 MED ORDER — ACETAMINOPHEN 325 MG PO TABS
650.0000 mg | ORAL_TABLET | Freq: Four times a day (QID) | ORAL | Status: DC | PRN
  Administered 2020-09-08 – 2020-09-12 (×11): 650 mg via ORAL
  Filled 2020-09-07 (×11): qty 2

## 2020-09-07 NOTE — H&P (Signed)
History and Physical    Norma Barajas:[id number] DOB: 1941/03/02 DOA: 09/07/2020  PCP: Loman Brooklyn, FNP  Patient coming from: home  I have personally briefly reviewed patient's old medical records in [place]  Chief Complaint: abdominal pain  HPI: Norma Barajas is a 79 y.o. female with medical history significant of hypertension, hyperlipidemia, atrial fibrillation on anticoagulation, presents to the emergency room with complaints of intermittent abdominal pain for the past 2 months.  Symptoms have progressively gotten worse, particularly in the last 24 hours.  Pain is described in the right upper quadrant, epigastrium radiating to her right upper back.  Symptoms are worse with p.o. intake.  She has had nausea, but no vomiting.  Bowel movements have otherwise been normal.  She is not had any fever.  She also does describe progressive shortness of breath over the last few months.  She reports is easier to breathe when she lays down flat, dyspnea is worse on exertion.  ED Course: She is noted to be tachycardic in atrial fibrillation.  Blood pressure is elevated.  WBC count is normal.  Transaminases also are not elevated, although bilirubin is mildly elevated.  Lactic acid also elevated.  CT of the abdomen and pelvis indicated possible gallbladder wall thickening and pericholecystic fluid.  She has been referred for admission.  Review of Systems:  Review of Systems  Constitutional: Negative for chills and fever.  HENT: Negative for sore throat.   Eyes: Negative for blurred vision and double vision.  Respiratory: Positive for cough and shortness of breath.   Cardiovascular: Negative for chest pain and palpitations.  Gastrointestinal: Positive for abdominal pain and nausea. Negative for diarrhea and vomiting.  Genitourinary: Negative for dysuria.  Neurological: Negative for dizziness, focal weakness and loss of consciousness.      Past Medical History:  Diagnosis Date  .  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 Anxiety   . Arthritis    "right leg" (04/11/2015)  . BPPV (benign paroxysmal positional vertigo) 03/23/2016  . Cataract   . Coronary atherosclerosis of native coronary artery    a. Nonobstructive minimal CAD 10/2005.  Marland Kitchen Depression   . Diastolic dysfunction    Grade 1. Ejection fraction 60-65%.  Marland Kitchen Dysrhythmia    a fib  . Erosive esophagitis   . Essential hypertension   . GERD (gastroesophageal reflux disease)   . Grade III hemorrhoids   . History of hiatal hernia   . Hypercholesterolemia   . Internal hemorrhoids with complication 7/67/2094   OCT 2015 FLEX SIG/IH BANDING    . Migraine    "used to have them right bad; I don't now" (04/11/2015)  . MITRAL REGURGITATION 04/27/2010   Qualifier: Diagnosis of  By: [name], MD, [name]   . Osteoporosis   . Paroxysmal atrial fibrillation (HCC)   . PONV (postoperative nausea and vomiting)   . Vertigo     Past Surgical History:  Procedure Laterality Date  . BRAVO [place] STUDY  11/15/2012   Procedure: BRAVO [place];  Surgeon: [name], MD;  Location: [facility];  Service: Endoscopy;;  . CARDIOVERSION N/A 05/16/2020   Procedure: CARDIOVERSION;  Surgeon: Donato Heinz, MD;  Location: [facility] ENDOSCOPY;  Service: Cardiovascular;  Laterality: N/A;  . CATARACT EXTRACTION W/ INTRAOCULAR LENS  IMPLANT, BILATERAL Bilateral   . COLONOSCOPY  2008   Dr. [name]: internal hemorrhoids   . DILATION AND CURETTAGE OF UTERUS    . ESOPHAGOGASTRODUODENOSCOPY (  EGD) WITH ESOPHAGEAL DILATION  2001   Dr. [name]: erosive esophagitis, esophageal stricture, duodenitis, s/p Savary dilation  . ESOPHAGOGASTRODUODENOSCOPY (EGD) WITH ESOPHAGEAL DILATION  11/15/2012   [facility]:ENIDPOEUMP web was found & MOST LIKELY CAUSE FOR DYAPHAGIA/Polyp was found in the gastric body and gastric fundus/ gastritis on bx  . EYE SURGERY Bilateral    "laser OR after cataract OR; cause I couldn't see"  . FLEXIBLE SIGMOIDOSCOPY N/A  08/22/2014   mild diverticulosis in sigmoid, moderate sized Grade 3 hemorrhoids s/p banding X 3.   Marland Kitchen FRACTURE SURGERY Right    below the knee - 2 bones broke has plates  . HEMORRHOID BANDING N/A 08/22/2014   Procedure: HEMORRHOID BANDING;  Surgeon: [name], MD;  Location: [facility];  Service: Endoscopy;  Laterality: N/A;  . HEMORRHOID SURGERY N/A 03/30/2018   Procedure: EXTENSIVE HEMORRHOIDECTOMY;  Surgeon: [name], MD;  Location: [facility] ORS;  Service: General;  Laterality: N/A;  . OPEN REDUCTION INTERNAL FIXATION (ORIF) TIBIA/FIBULA FRACTURE Right 2013   broke tibia and fibula after falling down stairs  . PROLAPSED UTERINE FIBROID LIGATION  2015  . TOTAL ABDOMINAL HYSTERECTOMY      Social History:  reports that she has never smoked. She has never used smokeless tobacco. She reports that she does not drink alcohol and does not use drugs.  Allergies  Allergen Reactions  . Codeine Nausea And Vomiting  . Doxycycline Other (See Comments)    Chest congestion  . Triamterene-Hctz Other (See Comments)    weakness  . Atorvastatin Other (See Comments)    Myalgias   . Crestor [Rosuvastatin Calcium] Other (See Comments)    weakness  . Morphine Nausea Only  . Risedronate Sodium Other (See Comments)    ACTONEL - reflux    Family History  Problem Relation Age of Onset  . Colon cancer Mother 31  . Heart disease Mother   . Osteoporosis Mother   . Hip fracture Mother   . Stroke Sister   . Diabetes Sister   . Osteoporosis Sister   . Arthritis Sister   . Uterine cancer Sister   . Stroke Sister   . Heart disease Father   . Hyperlipidemia Brother   . Hypertension Brother   . Heart disease Brother   . Stroke Brother   . Heart disease Sister   . Dementia Sister   . Diabetes Son   . Stroke Son   . Heart attack Neg Hx      Prior to Admission medications   Medication Sig Start Date End Date Taking? Authorizing Provider  acetaminophen (TYLENOL) 500 MG tablet Take  1,000 mg by mouth 2 (two) times daily.    Yes [provider]  amiodarone (PACERONE) 200 MG tablet Take 1 tablet (200 mg total) by mouth daily. 06/13/20  Yes Sherran Needs, NP  amLODipine (NORVASC) 10 MG tablet TAKE 1 TABLET EVERY DAY Patient taking differently: Take 10 mg by mouth daily.  09/04/20  Yes [name] F, FNP  cholecalciferol (VITAMIN D3) 25 MCG (1000 UT) tablet Take 1,000 Units by mouth daily.   Yes [provider]  furosemide (LASIX) 40 MG tablet Take 1 tablet (40 mg total) by mouth daily. 06/13/20  Yes Sherran Needs, NP  lisinopril (ZESTRIL) 10 MG tablet Take 1 tablet (10 mg total) by mouth daily. 08/27/20  Yes Gwenlyn Perking, FNP  loratadine (CLARITIN) 10 MG tablet Take 10 mg by mouth daily as needed for allergies.   Yes [provider]  metoprolol tartrate (LOPRESSOR) 50 MG tablet Take 1 tablet (50 mg total) by mouth 2 (two) times daily. 07/16/20  Yes Minus Breeding, MD  Multiple Vitamin (MULTIVITAMIN) capsule Take 1 capsule by mouth every evening.   Yes [provider]  Omega-3 Fatty Acids (FISH OIL) 1000 MG CAPS Take 1,000 mg by mouth 2 (two) times daily.    Yes [provider]  omeprazole (PRILOSEC) 20 MG capsule Take 1 capsule (20 mg total) by mouth daily. 12/11/19  Yes Loman Brooklyn, FNP  potassium chloride (KLOR-CON) 10 MEQ tablet Take 3 tablets (30 mEq total) by mouth daily with breakfast. 09/04/20  Yes Gwenlyn Perking, FNP  simvastatin (ZOCOR) 20 MG tablet Take 1 tablet (20 mg total) by mouth daily. 06/13/20  Yes Sherran Needs, NP  warfarin (COUMADIN) 3 MG tablet TAKE 1/2 TO 1 TABLET EVERY DAY AS DIRECTED BY  ANTICOAGULATION CLINIC Patient taking differently: Take 1.5-3 mg by mouth daily.  07/07/20  Yes [name] F, FNP    Physical Exam: Vitals:   09/07/20 1659 09/07/20 1700 09/07/20 1730 09/07/20 1745  BP: (!) 162/131 (!) 163/124 (!) 154/113 (!) 146/107  Pulse:  (!) 113 (!) 111 (!) 108  Resp:  (!) 31 (!) 29  (!) 29  Temp:      TempSrc:      SpO2:  95% 93% 97%  Weight:      Height:        Constitutional: NAD, calm, comfortable Eyes: PERRL, lids and conjunctivae normal ENMT: Mucous membranes are moist. Posterior pharynx clear of any exudate or lesions.Normal dentition.  Neck: normal, supple, no masses, no thyromegaly Respiratory: clear to auscultation bilaterally, no wheezing, no crackles. Normal respiratory effort. No accessory muscle use.  Cardiovascular: Irregular rate and rhythm, no murmurs / rubs / gallops. No extremity edema. 2+ pedal pulses. No carotid bruits.  Abdomen: Tender in right upper quadrant, no masses palpated. No hepatosplenomegaly. Bowel sounds positive.  Musculoskeletal: no clubbing / cyanosis. No joint deformity upper and lower extremities. Good ROM, no contractures. Normal muscle tone.  Skin: no rashes, lesions, ulcers. No induration Neurologic: CN 2-12 grossly intact. Sensation intact, DTR normal. Strength 5/5 in all 4.  Psychiatric: Normal judgment and insight. Alert and oriented x 3. Normal mood.    Labs on Admission: I have personally reviewed following labs and imaging studies  CBC: Recent Labs  Lab 09/07/20 0914  WBC 6.1  NEUTROABS 4.5  HGB 16.1*  HCT 50.1*  MCV 95.4  PLT 010   Basic Metabolic Panel: Recent Labs  Lab 09/04/20 1000 09/07/20 0914 09/07/20 1038  NA 144 138  --   K 3.5 3.0*  --   CL 97 98  --   CO2 30* 27  --   GLUCOSE 89 162*  --   BUN 14 16  --   CREATININE 1.15* 1.09*  --   CALCIUM 8.7 8.6*  --   MG  --   --  1.7   GFR: Estimated Creatinine Clearance: 30.5 mL/min (A) (by C-G formula based on SCr of 1.09 mg/dL (H)). Liver Function Tests: Recent Labs  Lab 09/07/20 0914  AST 29  ALT 19  ALKPHOS 66  BILITOT 1.3*  PROT 6.6  ALBUMIN 3.6   Recent Labs  Lab 09/07/20 0914  LIPASE 24   No results for input(s): AMMONIA in the last 168 hours. Coagulation Profile: Recent Labs  Lab 09/04/20 0100 09/04/20 1155  09/07/20 0916  INR 3.6* 3.6* 2.9*  Cardiac Enzymes: No results for input(s): CKTOTAL, CKMB, CKMBINDEX, TROPONINI in the last 168 hours. BNP (last 3 results) Recent Labs    08/18/20 1142  PROBNP CANCELED   HbA1C: No results for input(s): HGBA1C in the last 72 hours. CBG: No results for input(s): GLUCAP in the last 168 hours. Lipid Profile: No results for input(s): CHOL, HDL, LDLCALC, TRIG, CHOLHDL, LDLDIRECT in the last 72 hours. Thyroid Function Tests: No results for input(s): TSH, T4TOTAL, FREET4, T3FREE, THYROIDAB in the last 72 hours. Anemia Panel: No results for input(s): VITAMINB12, FOLATE, FERRITIN, TIBC, IRON, RETICCTPCT in the last 72 hours. Urine analysis:    Component Value Date/Time   COLORURINE STRAW (A) 07/10/2019 1025   APPEARANCEUR Clear 07/31/2020 1545   LABSPEC 1.003 (L) 07/10/2019 1025   PHURINE 8.0 07/10/2019 1025   GLUCOSEU Negative 07/31/2020 1545   HGBUR NEGATIVE 07/10/2019 1025   BILIRUBINUR Negative 07/31/2020 1545   KETONESUR NEGATIVE 07/10/2019 1025   PROTEINUR Trace (A) 07/31/2020 1545   PROTEINUR NEGATIVE 07/10/2019 1025   UROBILINOGEN negative 02/06/2015 1252   NITRITE Negative 07/31/2020 1545   NITRITE NEGATIVE 07/10/2019 1025   LEUKOCYTESUR Negative 07/31/2020 1545   LEUKOCYTESUR NEGATIVE 07/10/2019 1025    Radiological Exams on Admission: CT ABDOMEN PELVIS W CONTRAST  Result Date: 09/07/2020 CLINICAL DATA:  Abdominal pain, lower back pain for 3 months. EXAM: CT ABDOMEN AND PELVIS WITH CONTRAST TECHNIQUE: Multidetector CT imaging of the abdomen and pelvis was performed using the standard protocol following bolus administration of intravenous contrast. CONTRAST:  63mL OMNIPAQUE IOHEXOL 300 MG/ML  SOLN COMPARISON:  CT abdomen pelvis dated 07/10/2019 and other prior exams. FINDINGS: Lower chest: There are small bilateral pleural effusions with associated atelectasis. Hepatobiliary: Two hepatic cysts are redemonstrated. There is a gallbladder  wall thickening with surrounding edema. No definite gallstones are identified. No biliary dilatation. Pancreas: A 6 mm low attenuating mass in the pancreatic tail is not significantly changed since at least 11/28/2012 and presumed benign. No pancreatic ductal dilatation or surrounding inflammatory changes. Spleen: Normal in size without focal abnormality. Adrenals/Urinary Tract: A 1.5 cm lipid rich adrenal adenoma in the right is unchanged and benign. The left adrenal gland appears normal. Kidneys are normal, without renal calculi, focal lesion, or hydronephrosis. Bladder is unremarkable. Stomach/Bowel: Stomach is within normal limits. Appendix appears normal. There is colonic diverticulosis without evidence of diverticulitis. No evidence of bowel wall thickening, distention, or inflammatory changes. Vascular/Lymphatic: Aortic atherosclerosis. No enlarged abdominal or pelvic lymph nodes. Reproductive: Status post hysterectomy. No adnexal masses. Other: There is moderate volume free intraperitoneal fluid. No abdominal wall hernia is identified. Musculoskeletal: Degenerative changes are seen in the spine. IMPRESSION: 1. Gallbladder wall thickening with surrounding edema may represent acute cholecystitis. Right upper quadrant ultrasound could be performed if the clinically indicated. 2. Moderate volume free intraperitoneal fluid. 3. Small bilateral pleural effusions with associated atelectasis. Aortic Atherosclerosis (ICD10-I70.0). Electronically Signed   By: Zerita Boers M.D.   On: 09/07/2020 10:37   DG Chest Portable 1 View  Result Date: 09/07/2020 CLINICAL DATA:  Pt states she has been having BP issues onset for several days. Pt denies sob, cp, fever, or N/V. No COVID test ordered at this time. HX of hypertension. EXAM: PORTABLE CHEST 1 VIEW COMPARISON:  10/26/2017.  CT, 11/23/2017. FINDINGS: Heart mildly enlarged.  No mediastinal or hilar masses. Lungs demonstrate vascular prominence. There is interstitial  thickening and linear areas of scarring or atelectasis at the lung bases, greater on the left. Additional left lung base  opacity obscures hemidiaphragm, which may reflect a pleural effusion in addition to the lung base opacity. No convincing pulmonary edema. No pneumothorax. Skeletal structures are grossly intact. IMPRESSION: 1. Left lung base opacity that is likely a combination of a small effusion and atelectasis. Consider pneumonia if there are consistent clinical findings. 2. Vascular congestion and mild lower lung interstitial thickening without convincing pulmonary edema. Electronically Signed   By: Lajean Manes M.D.   On: 09/07/2020 09:37    EKG: Independently reviewed.  A. fib with RVR  Assessment/Plan Active Problems:   Hyperlipidemia   Essential hypertension   Atrial fibrillation (HCC)   Chronic anticoagulation   Cholecystitis     Acute acalculous cholecystitis -Patient presents with right upper quadrant abdominal pain that was worse with food intake -CT indicates possible cholecystitis -Treatment started on intravenous antibiotics -Seen by general surgery, will keep n.p.o. except meds for now -Right upper quadrant ultrasound in a.m. -Lactic acid is elevated, but she does not appear septic or toxic.  Atrial fibrillation, chronic -INR is currently therapeutic -We will hold further Coumadin in case any procedures are needed -Start IV heparin once INR less than 2 -Continue metoprolol for rate control as well as amiodarone  Hypertension -Blood pressure currently elevated -Continue amlodipine and metoprolol  Hyperlipidemia -Hold statin for now  DVT prophylaxis: Coumadin Code Status: DNR Family Communication: updated patient husband over the phone Disposition Plan: Pending hospital course, may need placement Consults called: General surgery Admission status: Inpatient, telemetry  Kathie Dike MD [facility]   If 7PM-7AM, please contact  night-coverage [email or web address]   09/07/2020, 6:23 PM

## 2020-09-07 NOTE — ED Triage Notes (Signed)
Patient hypertensive, chronic A-fib. States that she has not taken any home meds PTA.

## 2020-09-07 NOTE — ED Notes (Signed)
Date and time results received: 09/07/20 1111   Test: Lactic Acid Critical Value: 2.1  Name of Provider Notified: Dr. Gilford Raid  Orders Received? Or Actions Taken?:No new orders given.

## 2020-09-07 NOTE — Progress Notes (Signed)
Pharmacy Antibiotic Note  Norma Barajas is a 79 y.o. female admitted on 09/07/2020 with Intra abdominal infection.  Pharmacy has been consulted for zosyn dosing.  Plan: Zosyn 3.375g IV q8h (4 hour infusion).  Height: 5\' 1"  (154.9 cm) Weight: 54.4 kg (120 lb) IBW/kg (Calculated) : 47.8  Temp (24hrs), Avg:97.7 F (36.5 C), Min:97.7 F (36.5 C), Max:97.7 F (36.5 C)  Recent Labs  Lab 09/04/20 1000 09/07/20 0914 09/07/20 1038  WBC  --  6.1  --   CREATININE 1.15* 1.09*  --   LATICACIDVEN  --  2.3* 2.1*    Estimated Creatinine Clearance: 30.5 mL/min (A) (by C-G formula based on SCr of 1.09 mg/dL (H)).    Allergies  Allergen Reactions   Codeine Nausea And Vomiting   Doxycycline Other (See Comments)    Chest congestion   Triamterene-Hctz Other (See Comments)    weakness   Atorvastatin Other (See Comments)    Myalgias    Crestor [Rosuvastatin Calcium] Other (See Comments)    weakness   Morphine Nausea Only   Risedronate Sodium Other (See Comments)    ACTONEL - reflux    Antimicrobials this admission: 10/31 zosyn >>   Microbiology results: 10/31 Covid/flu: negative  Thank you for allowing pharmacy to be a part of this patients care.  Donna Christen Raeven Pint 09/07/2020 4:40 PM

## 2020-09-07 NOTE — ED Notes (Signed)
Patient taken to floor at this time.

## 2020-09-07 NOTE — ED Notes (Signed)
Date and time results received: 09/07/20 1033 (use smartphrase ".now" to insert current time)  Test: Lactic Acid Critical Value: 2.3  Name of Provider Notified: Dr. Gilford Raid  Orders Received? Or Actions Taken?: Yes

## 2020-09-07 NOTE — Consult Note (Signed)
[facility] SURGICAL CONSULTATION NOTE (initial) - cpt: [id number])   HISTORY OF PRESENT ILLNESS (HPI):  79 y.o. female presented to [facility] ED today for evaluation of back pain. Patient reports a 42-month history of intermittent/progressive back pain, right upper quadrant pain, associated abdominal bloating and some loose stools.  She reports she had worse pain postprandially, worse with a [place] meal.  Some nausea, denying vomiting.  Denies fevers and chills. She reports having a right upper quadrant ultrasound in July, that was reportedly negative.  Surgery is consulted by ED physician Dr. Gilford Raid in this context for evaluation and management of what appears to be consistent with acute cholecystitis.  PAST MEDICAL HISTORY (PMH):  Past Medical History:  Diagnosis Date  . 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 Anxiety   . Arthritis    "right leg" (04/11/2015)  . BPPV (benign paroxysmal positional vertigo) 03/23/2016  . Cataract   . Coronary atherosclerosis of native coronary artery    a. Nonobstructive minimal CAD 10/2005.  Marland Kitchen Depression   . Diastolic dysfunction    Grade 1. Ejection fraction 60-65%.  Marland Kitchen Dysrhythmia    a fib  . Erosive esophagitis   . Essential hypertension   . GERD (gastroesophageal reflux disease)   . Grade III hemorrhoids   . History of hiatal hernia   . Hypercholesterolemia   . Internal hemorrhoids with complication 03/13/3975   OCT 2015 FLEX SIG/IH BANDING    . Migraine    "used to have them right bad; I don't now" (04/11/2015)  . MITRAL REGURGITATION 04/27/2010   Qualifier: Diagnosis of  By: [name], MD, [name]   . Osteoporosis   . Paroxysmal atrial fibrillation (HCC)   . PONV (postoperative nausea and vomiting)   . Vertigo      PAST SURGICAL HISTORY ([place]):  Past Surgical History:  Procedure Laterality Date  . BRAVO [place] STUDY  11/15/2012   Procedure: BRAVO [place];  Surgeon: [name], MD;  Location: [facility];  Service: Endoscopy;;  . CARDIOVERSION N/A 05/16/2020   Procedure: CARDIOVERSION;  Surgeon: Donato Heinz, MD;  Location: [facility] ENDOSCOPY;  Service: Cardiovascular;  Laterality: N/A;  . CATARACT EXTRACTION W/ INTRAOCULAR LENS  IMPLANT, BILATERAL Bilateral   . COLONOSCOPY  2008   Dr. [name]: internal hemorrhoids   . DILATION AND CURETTAGE OF UTERUS    . ESOPHAGOGASTRODUODENOSCOPY (EGD) WITH ESOPHAGEAL DILATION  2001   Dr. [name]: erosive esophagitis, esophageal stricture, duodenitis, s/p Savary dilation  . ESOPHAGOGASTRODUODENOSCOPY (EGD) WITH ESOPHAGEAL DILATION  11/15/2012   BHA:[id number] web was found & MOST LIKELY CAUSE FOR DYAPHAGIA/Polyp was found in the gastric body and gastric fundus/ gastritis on bx  . EYE SURGERY Bilateral    "laser OR after cataract OR; cause I couldn't see"  . FLEXIBLE SIGMOIDOSCOPY N/A 08/22/2014   mild diverticulosis in sigmoid, moderate sized Grade 3 hemorrhoids s/p banding X 3.   Marland Kitchen FRACTURE SURGERY Right    below the knee - 2 bones broke has plates  . HEMORRHOID BANDING N/A 08/22/2014   Procedure: HEMORRHOID BANDING;  Surgeon: [name], MD;  Location: [facility];  Service: Endoscopy;  Laterality: N/A;  . HEMORRHOID SURGERY N/A 03/30/2018   Procedure: EXTENSIVE HEMORRHOIDECTOMY;  Surgeon: [name], MD;  Location: [facility] ORS;  Service: General;  Laterality: N/A;  . OPEN REDUCTION INTERNAL FIXATION (ORIF) TIBIA/FIBULA FRACTURE Right 2013   broke tibia and fibula after falling down stairs  . PROLAPSED  UTERINE FIBROID LIGATION  2015  . TOTAL ABDOMINAL HYSTERECTOMY       MEDICATIONS:  Prior to Admission medications   Medication Sig Start Date End Date Taking? Authorizing Provider  acetaminophen (TYLENOL) 500 MG tablet Take 1,000 mg by mouth 2 (two) times daily.    Yes [provider]  amiodarone (PACERONE) 200 MG tablet Take 1 tablet (200 mg total) by mouth daily. 06/13/20  Yes Sherran Needs, NP  amLODipine (NORVASC) 10 MG  tablet TAKE 1 TABLET EVERY DAY Patient taking differently: Take 10 mg by mouth daily.  09/04/20  Yes [name] F, FNP  cholecalciferol (VITAMIN D3) 25 MCG (1000 UT) tablet Take 1,000 Units by mouth daily.   Yes [provider]  furosemide (LASIX) 40 MG tablet Take 1 tablet (40 mg total) by mouth daily. 06/13/20  Yes Sherran Needs, NP  lisinopril (ZESTRIL) 10 MG tablet Take 1 tablet (10 mg total) by mouth daily. 08/27/20  Yes Gwenlyn Perking, FNP  loratadine (CLARITIN) 10 MG tablet Take 10 mg by mouth daily as needed for allergies.   Yes [provider]  metoprolol tartrate (LOPRESSOR) 50 MG tablet Take 1 tablet (50 mg total) by mouth 2 (two) times daily. 07/16/20  Yes Minus Breeding, MD  Multiple Vitamin (MULTIVITAMIN) capsule Take 1 capsule by mouth every evening.   Yes [provider]  Omega-3 Fatty Acids (FISH OIL) 1000 MG CAPS Take 1,000 mg by mouth 2 (two) times daily.    Yes [provider]  omeprazole (PRILOSEC) 20 MG capsule Take 1 capsule (20 mg total) by mouth daily. 12/11/19  Yes Loman [place], FNP  potassium chloride (KLOR-CON) 10 MEQ tablet Take 3 tablets (30 mEq total) by mouth daily with breakfast. 09/04/20  Yes Gwenlyn Perking, FNP  simvastatin (ZOCOR) 20 MG tablet Take 1 tablet (20 mg total) by mouth daily. 06/13/20  Yes Sherran Needs, NP  warfarin (COUMADIN) 3 MG tablet TAKE 1/2 TO 1 TABLET EVERY DAY AS DIRECTED BY  ANTICOAGULATION CLINIC Patient taking differently: Take 1.5-3 mg by mouth daily.  07/07/20  Yes Loman [place], FNP     ALLERGIES:  Allergies  Allergen Reactions  . Codeine Nausea And Vomiting  . Doxycycline Other (See Comments)    Chest congestion  . Triamterene-Hctz Other (See Comments)    weakness  . Atorvastatin Other (See Comments)    Myalgias   . Crestor [Rosuvastatin Calcium] Other (See Comments)    weakness  . Morphine Nausea Only  . Risedronate Sodium Other (See Comments)    ACTONEL - reflux      SOCIAL HISTORY:  Social History   Socioeconomic History  . Marital status: Married    Spouse name: george   . Number of children: 1  . Years of education: Not on file  . Highest education level: Not on file  Occupational History  . Occupation: Retired    Comment: Textile  Tobacco Use  . Smoking status: Never Smoker  . Smokeless tobacco: Never Used  Vaping Use  . Vaping Use: Never used  Substance and Sexual Activity  . Alcohol use: No    Alcohol/week: 0.0 standard drinks  . Drug use: No  . Sexual activity: Not Currently    Birth control/protection: Surgical, Post-menopausal    Comment: hyst  Other Topics Concern  . Not on file  Social History Narrative   Married   No regular exercise   Social Determinants of Health   Financial Resource Strain:   .  Difficulty of Paying Living Expenses: Not on file  Food Insecurity:   . Worried About Charity fundraiser in the Last Year: Not on file  . Ran Out of Food in the Last Year: Not on file  Transportation Needs:   . Lack of Transportation (Medical): Not on file  . Lack of Transportation (Non-Medical): Not on file  Physical Activity:   . Days of Exercise per Week: Not on file  . Minutes of Exercise per Session: Not on file  Stress:   . Feeling of Stress : Not on file  Social Connections:   . Frequency of Communication with Friends and Family: Not on file  . Frequency of Social Gatherings with Friends and Family: Not on file  . Attends Religious Services: Not on file  . Active Member of Clubs or Organizations: Not on file  . Attends Archivist Meetings: Not on file  . Marital Status: Not on file  Intimate Partner Violence:   . Fear of Current or Ex-Partner: Not on file  . Emotionally Abused: Not on file  . Physically Abused: Not on file  . Sexually Abused: Not on file     FAMILY HISTORY:  Family History  Problem Relation Age of Onset  . Colon cancer Mother 41  . Heart disease Mother   . Osteoporosis  Mother   . Hip fracture Mother   . Stroke Sister   . Diabetes Sister   . Osteoporosis Sister   . Arthritis Sister   . Uterine cancer Sister   . Stroke Sister   . Heart disease Father   . Hyperlipidemia Brother   . Hypertension Brother   . Heart disease Brother   . Stroke Brother   . Heart disease Sister   . Dementia Sister   . Diabetes Son   . Stroke Son   . Heart attack Neg Hx       REVIEW OF SYSTEMS:  Review of Systems  Constitutional: Negative.   HENT: Negative.   Eyes: Negative.   Respiratory: Negative.   Cardiovascular: Negative.   Gastrointestinal: Positive for abdominal pain and nausea. Negative for constipation and vomiting.  Genitourinary: Negative.   Musculoskeletal: Positive for back pain.  Skin: Negative.   Psychiatric/Behavioral: Negative.     VITAL SIGNS:  Temp:  [97.7 F (36.5 C)] 97.7 F (36.5 C) (10/31 0855) Pulse Rate:  [92-148] 110 (10/31 1130) Resp:  [15-32] 28 (10/31 1230) BP: (127-171)/(85-133) 128/99 (10/31 1230) SpO2:  [87 %-100 %] 100 % (10/31 1130) Weight:  [54.4 kg] 54.4 kg (10/31 0857)     Height: 5\' 1"  (154.9 cm) Weight: 54.4 kg BMI (Calculated): 22.69   INTAKE/OUTPUT:  No intake/output data recorded.  PHYSICAL EXAM:  Physical Exam Blood pressure (!) 128/99, pulse (!) 110, temperature 97.7 F (36.5 C), temperature source Oral, resp. rate (!) 28, height 5\' 1"  (1.549 m), weight 54.4 kg, SpO2 100 %. Last Weight  Most recent update: 09/07/2020  8:57 AM   Weight  54.4 kg (120 lb)            CONSTITUTIONAL: Well developed, and nourished, elderly female, appropriately responsive and aware without distress.   EYES: Sclera non-icteric.   EARS, NOSE, MOUTH AND THROAT: Mask worn.     Hearing is intact to voice.  NECK: Trachea is midline, and there is no jugular venous distension.  LYMPH NODES: No appreciable cervical lymphadenopathy or masses. RESPIRATORY:  Lungs are clear, and breath sounds are equal bilaterally. Normal respiratory  effort  without pathologic use of accessory muscles. CARDIOVASCULAR: Heart is regular in rate and rhythm. GI: The abdomen is soft, nontender, and nondistended.  There is right upper quadrant tenderness to deep palpation, I do not elicit a Murphy's sign.  There were no palpable masses. I did not appreciate hepatosplenomegaly. There were normal bowel sounds. MUSCULOSKELETAL: No obvious skeletal deformities.    SKIN: Skin turgor is normal.  NEUROLOGIC: No focal deficits appreciated.   PSYCH:  Alert and oriented to person, place and time. Affect is appropriate for situation.  Data Reviewed I have personally reviewed what is currently available of the patient's imaging, recent labs and medical records.    Labs:  CBC Latest Ref Rng & Units 09/07/2020 08/20/2020 08/18/2020  WBC 4.0 - 10.5 K/uL 6.1 6.7 6.8  Hemoglobin 12.0 - 15.0 g/dL 16.1(H) 15.3 15.8  Hematocrit 36 - 46 % 50.1(H) 46.4 48.5(H)  Platelets 150 - 400 K/uL 183 130(L) 143(L)   CMP Latest Ref Rng & Units 09/07/2020 09/04/2020 08/27/2020  Glucose 70 - 99 mg/dL 162(H) 89 139(H)  BUN 8 - 23 mg/dL 16 14 15   Creatinine 0.44 - 1.00 mg/dL 1.09(H) 1.15(H) 1.20(H)  Sodium 135 - 145 mmol/L 138 144 147(H)  Potassium 3.5 - 5.1 mmol/L 3.0(L) 3.5 2.7(L)  Chloride 98 - 111 mmol/L 98 97 98  CO2 22 - 32 mmol/L 27 30(H) 33(H)  Calcium 8.9 - 10.3 mg/dL 8.6(L) 8.7 9.0  Total Protein 6.5 - 8.1 g/dL 6.6 - 6.7  Total Bilirubin 0.3 - 1.2 mg/dL 1.3(H) - 0.6  Alkaline Phos 38 - 126 U/L 66 - 81  AST 15 - 41 U/L 29 - 22  ALT 0 - 44 U/L 19 - 16     Imaging studies:  Coagulation:  Lab Results  Component Value Date   INR 2.9 (H) 09/07/2020   INR 3.6 (H) 09/04/2020   APTT 55 (H) 04/16/2016   Last 24 hrs: CT ABDOMEN PELVIS W CONTRAST  Result Date: 09/07/2020 CLINICAL DATA:  Abdominal pain, lower back pain for 3 months. EXAM: CT ABDOMEN AND PELVIS WITH CONTRAST TECHNIQUE: Multidetector CT imaging of the abdomen and pelvis was performed using the  standard protocol following bolus administration of intravenous contrast. CONTRAST:  20mL OMNIPAQUE IOHEXOL 300 MG/ML  SOLN COMPARISON:  CT abdomen pelvis dated 07/10/2019 and other prior exams. FINDINGS: Lower chest: There are small bilateral pleural effusions with associated atelectasis. Hepatobiliary: Two hepatic cysts are redemonstrated. There is a gallbladder wall thickening with surrounding edema. No definite gallstones are identified. No biliary dilatation. Pancreas: A 6 mm low attenuating mass in the pancreatic tail is not significantly changed since at least 11/28/2012 and presumed benign. No pancreatic ductal dilatation or surrounding inflammatory changes. Spleen: Normal in size without focal abnormality. Adrenals/Urinary Tract: A 1.5 cm lipid rich adrenal adenoma in the right is unchanged and benign. The left adrenal gland appears normal. Kidneys are normal, without renal calculi, focal lesion, or hydronephrosis. Bladder is unremarkable. Stomach/Bowel: Stomach is within normal limits. Appendix appears normal. There is colonic diverticulosis without evidence of diverticulitis. No evidence of bowel wall thickening, distention, or inflammatory changes. Vascular/Lymphatic: Aortic atherosclerosis. No enlarged abdominal or pelvic lymph nodes. Reproductive: Status post hysterectomy. No adnexal masses. Other: There is moderate volume free intraperitoneal fluid. No abdominal wall hernia is identified. Musculoskeletal: Degenerative changes are seen in the spine. IMPRESSION: 1. Gallbladder wall thickening with surrounding edema may represent acute cholecystitis. Right upper quadrant ultrasound could be performed if the clinically indicated. 2. Moderate volume free intraperitoneal  fluid. 3. Small bilateral pleural effusions with associated atelectasis. Aortic Atherosclerosis (ICD10-I70.0). Electronically Signed   By: Zerita Boers M.D.   On: 09/07/2020 10:37   DG Chest Portable 1 View  Result Date:  09/07/2020 CLINICAL DATA:  Pt states she has been having BP issues onset for several days. Pt denies sob, cp, fever, or N/V. No COVID test ordered at this time. HX of hypertension. EXAM: PORTABLE CHEST 1 VIEW COMPARISON:  10/26/2017.  CT, 11/23/2017. FINDINGS: Heart mildly enlarged.  No mediastinal or hilar masses. Lungs demonstrate vascular prominence. There is interstitial thickening and linear areas of scarring or atelectasis at the lung bases, greater on the left. Additional left lung base opacity obscures hemidiaphragm, which may reflect a pleural effusion in addition to the lung base opacity. No convincing pulmonary edema. No pneumothorax. Skeletal structures are grossly intact. IMPRESSION: 1. Left lung base opacity that is likely a combination of a small effusion and atelectasis. Consider pneumonia if there are consistent clinical findings. 2. Vascular congestion and mild lower lung interstitial thickening without convincing pulmonary edema. Electronically Signed   By: Lajean Manes M.D.   On: 09/07/2020 09:37   CLINICAL DATA:  Right upper quadrant pain.  EXAM: ULTRASOUND ABDOMEN LIMITED RIGHT UPPER QUADRANT  COMPARISON:  07/10/2019 CT abdomen pelvis. 06/02/2006 abdominal ultrasound.  FINDINGS: Gallbladder:  No gallstones or wall thickening visualized. Small gallbladder wall polyp measuring 4 mm, unchanged. No sonographic Murphy sign noted by sonographer.  Common bile duct:  Diameter: 4.8 mm, within normal limits.  Liver:  Hepatic cysts measuring up to 1.8 cm. No solid hepatic lesions. Within normal limits in parenchymal echogenicity. Portal vein is patent on color Doppler imaging with normal direction of blood flow towards the liver.  Other: None.  IMPRESSION: 4 mm gallbladder polyp, unchanged. No evidence of cholelithiasis or cholecystitis.  Hepatic cysts.   Electronically Signed   By: Primitivo Gauze M.D.   On: 05/29/2020 12:39  Assessment/Plan:   79 y.o. female with history/clinical findings consistent with subacute acalculous cholecystitis, complicated by pertinent comorbidities including:  Patient Active Problem List   Diagnosis Date Noted  . Cholecystitis 09/07/2020  . Early satiety 07/15/2020  . Anorexia 07/15/2020  . Abdominal pain 07/15/2020  . Educated about COVID-19 virus infection 02/26/2020  . Chronic anticoagulation 07/17/2019  . Difficulty sleeping 06/22/2019  . Loose bowel movements 06/22/2019  . Atrial fibrillation ([place]) 08/21/2015  . Vitamin D deficiency 11/27/2014  . Coronary atherosclerosis of native coronary artery 07/24/2013  . Pulmonary nodule 02/18/2011  . Hyperlipidemia 07/29/2008  . GAD (generalized anxiety disorder) 07/29/2008  . Essential hypertension 07/29/2008  . GERD 07/29/2008  . Osteoporosis 07/29/2008    -Agree with medical admission, with holding Coumadin.  -Would keep n.p.o. with the exception of oral medications  -Consider HIDA scan/repeat ultrasound when available.  - DVT prophylaxis/GI prophylaxis  All of the above findings and recommendations were discussed with the patient and  family(if present), and all of patient's and present family's questions were answered to their expressed satisfaction.  Thank you for the opportunity to participate in this patient's care.   -- Ronny Bacon, M.D., FACS 09/07/2020, 1:17 PM

## 2020-09-07 NOTE — ED Provider Notes (Signed)
[facility] EMERGENCY DEPARTMENT Provider Note   CSN: [id number] Arrival date & time: 09/07/20  5093     History Chief Complaint  Patient presents with  . Back Pain    Complaining of lower back pain for past two months. Worse this am, radiates to abdomen at times. No other complaints.     Norma Barajas is a 79 y.o. female.  Pt presents to the ED today with back pain and abdominal pain.  Pt said it's worse this am, but has been intermittent for 2 months.  Pt denies any f/c.  She did not take her meds this am.  She's been compliant with her coumadin and took it last night as usual.  No n/v.  No diarrhea.        Past Medical History:  Diagnosis Date  . 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 Anxiety   . Arthritis    "right leg" (04/11/2015)  . BPPV (benign paroxysmal positional vertigo) 03/23/2016  . Cataract   . Coronary atherosclerosis of native coronary artery    a. Nonobstructive minimal CAD 10/2005.  Marland Kitchen Depression   . Diastolic dysfunction    Grade 1. Ejection fraction 60-65%.  Marland Kitchen Dysrhythmia    a fib  . Erosive esophagitis   . Essential hypertension   . GERD (gastroesophageal reflux disease)   . Grade III hemorrhoids   . History of hiatal hernia   . Hypercholesterolemia   . Internal hemorrhoids with complication 2/67/1245   OCT 2015 FLEX SIG/IH BANDING    . Migraine    "used to have them right bad; I don't now" (04/11/2015)  . MITRAL REGURGITATION 04/27/2010   Qualifier: Diagnosis of  By: [name], MD, [name]   . Osteoporosis   . Paroxysmal atrial fibrillation (HCC)   . PONV (postoperative nausea and vomiting)   . Vertigo     Patient Active Problem List   Diagnosis Date Noted  . Cholecystitis 09/07/2020  . Early satiety 07/15/2020  . Anorexia 07/15/2020  . Abdominal pain 07/15/2020  . Educated about COVID-19 virus infection 02/26/2020  . Chronic anticoagulation 07/17/2019  . Difficulty sleeping 06/22/2019  . Loose bowel movements  06/22/2019  . Atrial fibrillation ([place]) 08/21/2015  . Vitamin D deficiency 11/27/2014  . Coronary atherosclerosis of native coronary artery 07/24/2013  . Pulmonary nodule 02/18/2011  . Hyperlipidemia 07/29/2008  . GAD (generalized anxiety disorder) 07/29/2008  . Essential hypertension 07/29/2008  . GERD 07/29/2008  . Osteoporosis 07/29/2008    Past Surgical History:  Procedure Laterality Date  . BRAVO [place] STUDY  11/15/2012   Procedure: BRAVO [place];  Surgeon: [name], MD;  Location: [facility] ENDO SUITE;  Service: Endoscopy;;  . CARDIOVERSION N/A 05/16/2020   Procedure: CARDIOVERSION;  Surgeon: Donato Heinz, MD;  Location: [facility] ENDOSCOPY;  Service: Cardiovascular;  Laterality: N/A;  . CATARACT EXTRACTION W/ INTRAOCULAR LENS  IMPLANT, BILATERAL Bilateral   . COLONOSCOPY  2008   Dr. [name]: internal hemorrhoids   . DILATION AND CURETTAGE OF UTERUS    . ESOPHAGOGASTRODUODENOSCOPY (EGD) WITH ESOPHAGEAL DILATION  2001   Dr. [name]: erosive esophagitis, esophageal stricture, duodenitis, s/p Savary dilation  . ESOPHAGOGASTRODUODENOSCOPY (EGD) WITH ESOPHAGEAL DILATION  11/15/2012   YKD:[id number] web was found & MOST LIKELY CAUSE FOR DYAPHAGIA/Polyp was found in the gastric body and gastric fundus/ gastritis on bx  . EYE SURGERY Bilateral    "laser OR after cataract OR; cause I couldn't see"  . FLEXIBLE SIGMOIDOSCOPY N/A  08/22/2014   mild diverticulosis in sigmoid, moderate sized Grade 3 hemorrhoids s/p banding X 3.   Marland Kitchen FRACTURE SURGERY Right    below the knee - 2 bones broke has plates  . HEMORRHOID BANDING N/A 08/22/2014   Procedure: HEMORRHOID BANDING;  Surgeon: [name], MD;  Location: [facility] ENDO SUITE;  Service: Endoscopy;  Laterality: N/A;  . HEMORRHOID SURGERY N/A 03/30/2018   Procedure: EXTENSIVE HEMORRHOIDECTOMY;  Surgeon: [name], MD;  Location: [facility] ORS;  Service: General;  Laterality: N/A;  . OPEN REDUCTION INTERNAL FIXATION (ORIF) TIBIA/FIBULA FRACTURE Right  2013   broke tibia and fibula after falling down stairs  . PROLAPSED UTERINE FIBROID LIGATION  2015  . TOTAL ABDOMINAL HYSTERECTOMY       OB History    Gravida  1   Para  1   Term  1   Preterm      AB      Living  1     SAB      TAB      Ectopic      Multiple      Live Births              Family History  Problem Relation Age of Onset  . Colon cancer Mother 72  . Heart disease Mother   . Osteoporosis Mother   . Hip fracture Mother   . Stroke Sister   . Diabetes Sister   . Osteoporosis Sister   . Arthritis Sister   . Uterine cancer Sister   . Stroke Sister   . Heart disease Father   . Hyperlipidemia Brother   . Hypertension Brother   . Heart disease Brother   . Stroke Brother   . Heart disease Sister   . Dementia Sister   . Diabetes Son   . Stroke Son   . Heart attack Neg Hx     Social History   Tobacco Use  . Smoking status: Never Smoker  . Smokeless tobacco: Never Used  Vaping Use  . Vaping Use: Never used  Substance Use Topics  . Alcohol use: No    Alcohol/week: 0.0 standard drinks  . Drug use: No    Home Medications Prior to Admission medications   Medication Sig Start Date End Date Taking? Authorizing Provider  acetaminophen (TYLENOL) 500 MG tablet Take 1,000 mg by mouth 2 (two) times daily.    Yes [provider]  amiodarone (PACERONE) 200 MG tablet Take 1 tablet (200 mg total) by mouth daily. 06/13/20  Yes Sherran Needs, NP  amLODipine (NORVASC) 10 MG tablet TAKE 1 TABLET EVERY DAY Patient taking differently: Take 10 mg by mouth daily.  09/04/20  Yes [name] F, FNP  cholecalciferol (VITAMIN D3) 25 MCG (1000 UT) tablet Take 1,000 Units by mouth daily.   Yes [provider]  furosemide (LASIX) 40 MG tablet Take 1 tablet (40 mg total) by mouth daily. 06/13/20  Yes Sherran Needs, NP  lisinopril (ZESTRIL) 10 MG tablet Take 1 tablet (10 mg total) by mouth daily. 08/27/20  Yes Gwenlyn Perking, FNP  loratadine  (CLARITIN) 10 MG tablet Take 10 mg by mouth daily as needed for allergies.   Yes [provider]  metoprolol tartrate (LOPRESSOR) 50 MG tablet Take 1 tablet (50 mg total) by mouth 2 (two) times daily. 07/16/20  Yes Minus Breeding, MD  Multiple Vitamin (MULTIVITAMIN) capsule Take 1 capsule by mouth every evening.   Yes [provider]  Omega-3 Fatty Acids (FISH OIL) 1000 MG CAPS Take 1,000 mg by mouth 2 (two) times daily.    Yes [provider]  omeprazole (PRILOSEC) 20 MG capsule Take 1 capsule (20 mg total) by mouth daily. 12/11/19  Yes Loman Brooklyn, FNP  potassium chloride (KLOR-CON) 10 MEQ tablet Take 3 tablets (30 mEq total) by mouth daily with breakfast. 09/04/20  Yes Gwenlyn Perking, FNP  simvastatin (ZOCOR) 20 MG tablet Take 1 tablet (20 mg total) by mouth daily. 06/13/20  Yes Sherran Needs, NP  warfarin (COUMADIN) 3 MG tablet TAKE 1/2 TO 1 TABLET EVERY DAY AS DIRECTED BY  ANTICOAGULATION CLINIC Patient taking differently: Take 1.5-3 mg by mouth daily.  07/07/20  Yes [name] F, FNP    Allergies    Codeine, Doxycycline, Triamterene-hctz, Atorvastatin, Crestor [rosuvastatin calcium], Morphine, and Risedronate sodium  Review of Systems   Review of Systems  Gastrointestinal: Positive for abdominal pain.  Musculoskeletal: Positive for back pain.  All other systems reviewed and are negative.   Physical Exam Updated Vital Signs BP (!) 148/103   Pulse 92   Temp 97.7 F (36.5 C) (Oral)   Resp (!) 26   Ht 5\' 1"  (1.549 m)   Wt 54.4 kg   SpO2 99%   BMI 22.67 kg/m   Physical Exam Vitals and nursing note reviewed.  Constitutional:      Appearance: Normal appearance.  HENT:     Head: Normocephalic and atraumatic.     Right Ear: External ear normal.     Left Ear: External ear normal.     Nose: Nose normal.     Mouth/Throat:     Mouth: Mucous membranes are dry.  Eyes:     Extraocular Movements: Extraocular movements intact.      Conjunctiva/sclera: Conjunctivae normal.     Pupils: Pupils are equal, round, and reactive to light.  Cardiovascular:     Rate and Rhythm: Tachycardia present. Rhythm irregular.     Pulses: Normal pulses.     Heart sounds: Normal heart sounds.  Pulmonary:     Effort: Pulmonary effort is normal.     Breath sounds: Normal breath sounds.  Abdominal:     General: Abdomen is flat. Bowel sounds are normal.     Palpations: Abdomen is soft.     Tenderness: There is abdominal tenderness in the right upper quadrant.  Musculoskeletal:        General: Normal range of motion.     Cervical back: Normal range of motion and neck supple.  Skin:    General: Skin is warm.     Capillary Refill: Capillary refill takes less than 2 seconds.  Neurological:     General: No focal deficit present.     Mental Status: She is alert and oriented to person, place, and time.  Psychiatric:        Mood and Affect: Mood normal.        Behavior: Behavior normal.        Thought Content: Thought content normal.        Judgment: Judgment normal.     ED Results / Procedures / Treatments   Labs (all labs ordered are listed, but only abnormal results are displayed) Labs Reviewed  CBC WITH DIFFERENTIAL/PLATELET - Abnormal; Notable for the following components:      Result Value   RBC 5.25 (*)    Hemoglobin 16.1 (*)    HCT 50.1 (*)    RDW 18.5 (*)  All other components within normal limits  COMPREHENSIVE METABOLIC PANEL - Abnormal; Notable for the following components:   Potassium 3.0 (*)    Glucose, Bld 162 (*)    Creatinine, Ser 1.09 (*)    Calcium 8.6 (*)    Total Bilirubin 1.3 (*)    GFR, Estimated 51 (*)    All other components within normal limits  LACTIC ACID, PLASMA - Abnormal; Notable for the following components:   Lactic Acid, Venous 2.3 (*)    All other components within normal limits  LACTIC ACID, PLASMA - Abnormal; Notable for the following components:   Lactic Acid, Venous 2.1 (*)    All  other components within normal limits  PROTIME-INR - Abnormal; Notable for the following components:   Prothrombin Time 29.2 (*)    INR 2.9 (*)    All other components within normal limits  RESPIRATORY PANEL BY RT PCR (FLU A&B, COVID)  LIPASE, BLOOD  URINALYSIS, ROUTINE W REFLEX MICROSCOPIC  TROPONIN I (HIGH SENSITIVITY)  TROPONIN I (HIGH SENSITIVITY)    EKG EKG Interpretation  Date/Time:  Sunday September 07 2020 08:59:47 EDT Ventricular Rate:  126 PR Interval:    QRS Duration: 60 QT Interval:  306 QTC Calculation: 443 R Axis:   -47 Text Interpretation: Atrial fibrillation Left anterior fascicular block Low voltage, precordial leads Borderline repolarization abnormality Since last tracing rate faster Confirmed by Isla Pence [id number]) on 09/07/2020 9:17:02 AM   Radiology CT ABDOMEN PELVIS W CONTRAST  Result Date: 09/07/2020 CLINICAL DATA:  Abdominal pain, lower back pain for 3 months. EXAM: CT ABDOMEN AND PELVIS WITH CONTRAST TECHNIQUE: Multidetector CT imaging of the abdomen and pelvis was performed using the standard protocol following bolus administration of intravenous contrast. CONTRAST:  61mL OMNIPAQUE IOHEXOL 300 MG/ML  SOLN COMPARISON:  CT abdomen pelvis dated 07/10/2019 and other prior exams. FINDINGS: Lower chest: There are small bilateral pleural effusions with associated atelectasis. Hepatobiliary: Two hepatic cysts are redemonstrated. There is a gallbladder wall thickening with surrounding edema. No definite gallstones are identified. No biliary dilatation. Pancreas: A 6 mm low attenuating mass in the pancreatic tail is not significantly changed since at least 11/28/2012 and presumed benign. No pancreatic ductal dilatation or surrounding inflammatory changes. Spleen: Normal in size without focal abnormality. Adrenals/Urinary Tract: A 1.5 cm lipid rich adrenal adenoma in the right is unchanged and benign. The left adrenal gland appears normal. Kidneys are normal, without  renal calculi, focal lesion, or hydronephrosis. Bladder is unremarkable. Stomach/Bowel: Stomach is within normal limits. Appendix appears normal. There is colonic diverticulosis without evidence of diverticulitis. No evidence of bowel wall thickening, distention, or inflammatory changes. Vascular/Lymphatic: Aortic atherosclerosis. No enlarged abdominal or pelvic lymph nodes. Reproductive: Status post hysterectomy. No adnexal masses. Other: There is moderate volume free intraperitoneal fluid. No abdominal wall hernia is identified. Musculoskeletal: Degenerative changes are seen in the spine. IMPRESSION: 1. Gallbladder wall thickening with surrounding edema may represent acute cholecystitis. Right upper quadrant ultrasound could be performed if the clinically indicated. 2. Moderate volume free intraperitoneal fluid. 3. Small bilateral pleural effusions with associated atelectasis. Aortic Atherosclerosis (ICD10-I70.0). Electronically Signed   By: Zerita Boers M.D.   On: 09/07/2020 10:37   DG Chest Portable 1 View  Result Date: 09/07/2020 CLINICAL DATA:  Pt states she has been having BP issues onset for several days. Pt denies sob, cp, fever, or N/V. No COVID test ordered at this time. HX of hypertension. EXAM: PORTABLE CHEST 1 VIEW COMPARISON:  10/26/2017.  CT, 11/23/2017. FINDINGS:  Heart mildly enlarged.  No mediastinal or hilar masses. Lungs demonstrate vascular prominence. There is interstitial thickening and linear areas of scarring or atelectasis at the lung bases, greater on the left. Additional left lung base opacity obscures hemidiaphragm, which may reflect a pleural effusion in addition to the lung base opacity. No convincing pulmonary edema. No pneumothorax. Skeletal structures are grossly intact. IMPRESSION: 1. Left lung base opacity that is likely a combination of a small effusion and atelectasis. Consider pneumonia if there are consistent clinical findings. 2. Vascular congestion and mild lower lung  interstitial thickening without convincing pulmonary edema. Electronically Signed   By: Lajean Manes M.D.   On: 09/07/2020 09:37    Procedures Procedures (including critical care time)  Medications Ordered in ED Medications  sodium chloride 0.9 % bolus 500 mL (0 mLs Intravenous Stopped 09/07/20 1037)  metoprolol tartrate (LOPRESSOR) injection 5 mg (5 mg Intravenous Given 09/07/20 0907)  ondansetron (ZOFRAN) injection 4 mg (4 mg Intravenous Given 09/07/20 1036)  fentaNYL (SUBLIMAZE) injection 50 mcg (50 mcg Intravenous Given 09/07/20 1035)  iohexol (OMNIPAQUE) 300 MG/ML solution 75 mL (75 mLs Intravenous Contrast Given 09/07/20 1020)  piperacillin-tazobactam (ZOSYN) IVPB 3.375 g (3.375 g Intravenous New Bag/Given 09/07/20 1101)    ED Course  I have reviewed the triage vital signs and the nursing notes.  Pertinent labs & imaging results that were available during my care of the patient were reviewed by me and considered in my medical decision making (see chart for details).    MDM Rules/Calculators/A&P                         HR and bp have improved with lopressor.  CHA2DS2/VAS Stroke Risk Points  Current as of 9 minutes ago     5 >= 2 Points: High Risk  1 - 1.99 Points: Medium Risk  0 Points: Low Risk    Last Change: N/A      Details    This score determines the patient's risk of having a stroke if the  patient has atrial fibrillation.       Points Metrics  0 Has Congestive Heart Failure:  No    Current as of 9 minutes ago  1 Has Vascular Disease:  Yes    Current as of 9 minutes ago  1 Has Hypertension:  Yes    Current as of 9 minutes ago  2 Age:  32    Current as of 9 minutes ago  0 Has Diabetes:  No    Current as of 9 minutes ago  0 Had Stroke:  No  Had TIA:  No  Had thromboembolism:  No    Current as of 9 minutes ago  1 Female:  Yes    Current as of 9 minutes ago     Covid negative.  CXR negative for pna.  Pt does have small bilateral pleural effusions.   CT  abd/pelvis suspicious for acute cholecystitis which corresponds to her pain.  The pt was started on Zosyn.  Pt d/w Dr. Christian Mate (surgery) who recommends medicine admission and hold coumadin.  Pt d/w Dr. Roderic Palau ([facility]) for admission.  Final Clinical Impression(s) / ED Diagnoses Final diagnoses:  Anticoagulated on Coumadin  Acute cholecystitis  Atrial fibrillation with rapid ventricular response [facility])    Rx / DC Orders ED Discharge Orders    None       Isla Pence, MD 09/07/20 1153

## 2020-09-07 NOTE — Progress Notes (Signed)
ANTICOAGULATION CONSULT NOTE - Initial Consult  Pharmacy Consult for heparin gtt  Indication: atrial fibrillation  Allergies  Allergen Reactions  . Codeine Nausea And Vomiting  . Doxycycline Other (See Comments)    Chest congestion  . Triamterene-Hctz Other (See Comments)    weakness  . Atorvastatin Other (See Comments)    Myalgias   . Crestor [Rosuvastatin Calcium] Other (See Comments)    weakness  . Morphine Nausea Only  . Risedronate Sodium Other (See Comments)    ACTONEL - reflux    Patient Measurements: Height: 5\' 1"  (154.9 cm) Weight: 54.4 kg (120 lb) IBW/kg (Calculated) : 47.8 Heparin Dosing Weight: HEPARIN DW (KG): 54.4   Vital Signs: Temp: 97.7 F (36.5 C) (10/31 0855) Temp Source: Oral (10/31 0855) BP: 142/123 (10/31 1630) Pulse Rate: 110 (10/31 1500)  Labs: Recent Labs    09/07/20 0914 09/07/20 0916  HGB 16.1*  --   HCT 50.1*  --   PLT 183  --   LABPROT  --  29.2*  INR  --  2.9*  CREATININE 1.09*  --     Estimated Creatinine Clearance: 30.5 mL/min (A) (by C-G formula based on SCr of 1.09 mg/dL (H)).   Medical History: Past Medical History:  Diagnosis Date  . 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 Anxiety   . Arthritis    "right leg" (04/11/2015)  . BPPV (benign paroxysmal positional vertigo) 03/23/2016  . Cataract   . Coronary atherosclerosis of native coronary artery    a. Nonobstructive minimal CAD 10/2005.  Marland Kitchen Depression   . Diastolic dysfunction    Grade 1. Ejection fraction 60-65%.  Marland Kitchen Dysrhythmia    a fib  . Erosive esophagitis   . Essential hypertension   . GERD (gastroesophageal reflux disease)   . Grade III hemorrhoids   . History of hiatal hernia   . Hypercholesterolemia   . Internal hemorrhoids with complication 9/89/2119   OCT 2015 FLEX SIG/IH BANDING    . Migraine    "used to have them right bad; I don't now" (04/11/2015)  . MITRAL REGURGITATION 04/27/2010   Qualifier: Diagnosis of  By: [name], MD, [name]   . Osteoporosis   . Paroxysmal atrial fibrillation (HCC)   . PONV (postoperative nausea and vomiting)   . Vertigo     Medications:  (Not in a hospital admission)  Scheduled:  . amiodarone  200 mg Oral Daily  . amLODipine  10 mg Oral Daily  . metoprolol tartrate  50 mg Oral BID  . pantoprazole  40 mg Oral Daily   Infusions:  . piperacillin-tazobactam (ZOSYN)  IV    . potassium chloride     PRN: acetaminophen **OR** acetaminophen, fentaNYL (SUBLIMAZE) injection, ondansetron **OR** ondansetron (ZOFRAN) IV Anti-infectives (From admission, onward)   Start     Dose/Rate Route Frequency Ordered Stop   09/07/20 1900  piperacillin-tazobactam (ZOSYN) IVPB 3.375 g        3.375 g 12.5 mL/hr over 240 Minutes Intravenous Every 8 hours 09/07/20 1630     09/07/20 1100  piperacillin-tazobactam (ZOSYN) IVPB 3.375 g        3.375 g 100 mL/hr over 30 Minutes Intravenous  Once 09/07/20 1056 09/07/20 1210      Assessment: Norma Barajas a 79 y.o. female requires anticoagulation with a heparin iv infusion for the indication of  atrial fibrillation. Heparin gtt will be started following pharmacy protocol per pharmacy consult. Patient is on warfarin prior to admission -  admit INR 2.9 therapeutic  Discussed with Dr Roderic Palau he would like to start heparin once INR < 2.0  Goal of Therapy:  Heparin level 0.3-0.7 units/ml Monitor platelets by anticoagulation protocol: Yes   Plan:  No warfarin Start heparin gtt once INR <2.0 INR & CBC daily ordered Monitor for signs and symptoms of bleeding   Norma Barajas 09/07/2020,4:33 PM

## 2020-09-08 ENCOUNTER — Inpatient Hospital Stay (HOSPITAL_COMMUNITY): Payer: Medicare HMO

## 2020-09-08 DIAGNOSIS — K8050 Calculus of bile duct without cholangitis or cholecystitis without obstruction: Secondary | ICD-10-CM

## 2020-09-08 LAB — COMPREHENSIVE METABOLIC PANEL
ALT: 18 U/L (ref 0–44)
AST: 26 U/L (ref 15–41)
Albumin: 3.1 g/dL — ABNORMAL LOW (ref 3.5–5.0)
Alkaline Phosphatase: 52 U/L (ref 38–126)
Anion gap: 13 (ref 5–15)
BUN: 13 mg/dL (ref 8–23)
CO2: 24 mmol/L (ref 22–32)
Calcium: 8.2 mg/dL — ABNORMAL LOW (ref 8.9–10.3)
Chloride: 101 mmol/L (ref 98–111)
Creatinine, Ser: 0.86 mg/dL (ref 0.44–1.00)
GFR, Estimated: >60 mL/min (ref >60)
Glucose, Bld: 72 mg/dL (ref 70–99)
Potassium: 4.2 mmol/L (ref 3.5–5.1)
Sodium: 138 mmol/L (ref 135–145)
Total Bilirubin: 1 mg/dL (ref 0.3–1.2)
Total Protein: 5.7 g/dL — ABNORMAL LOW (ref 6.5–8.1)

## 2020-09-08 LAB — CBC
HCT: 46.2 % — ABNORMAL HIGH (ref 36.0–46.0)
Hemoglobin: 14.8 g/dL (ref 12.0–15.0)
MCH: 30.8 pg (ref 26.0–34.0)
MCHC: 32 g/dL (ref 30.0–36.0)
MCV: 96.3 fL (ref 80.0–100.0)
Platelets: 173 10*3/uL (ref 150–400)
RBC: 4.8 MIL/uL (ref 3.87–5.11)
RDW: 18.8 % — ABNORMAL HIGH (ref 11.5–15.5)
WBC: 4.8 10*3/uL (ref 4.0–10.5)
nRBC: 0 % (ref 0.0–0.2)

## 2020-09-08 LAB — PROTIME-INR
INR: 3.1 — ABNORMAL HIGH (ref 0.8–1.2)
Prothrombin Time: 31.2 seconds — ABNORMAL HIGH (ref 11.4–15.2)

## 2020-09-08 MED ORDER — LOPERAMIDE HCL 2 MG PO CAPS
2.0000 mg | ORAL_CAPSULE | Freq: Once | ORAL | Status: AC
  Administered 2020-09-08: 2 mg via ORAL
  Filled 2020-09-08: qty 1

## 2020-09-08 MED ORDER — SACCHAROMYCES BOULARDII 250 MG PO CAPS
250.0000 mg | ORAL_CAPSULE | Freq: Two times a day (BID) | ORAL | Status: DC
  Administered 2020-09-08 – 2020-09-12 (×8): 250 mg via ORAL
  Filled 2020-09-08 (×8): qty 1

## 2020-09-08 MED ORDER — FUROSEMIDE 10 MG/ML IJ SOLN
40.0000 mg | Freq: Once | INTRAMUSCULAR | Status: AC
  Administered 2020-09-08: 40 mg via INTRAVENOUS
  Filled 2020-09-08: qty 4

## 2020-09-08 NOTE — Progress Notes (Signed)
[facility] Progress Note     Subjective: Patient reports still having RUQ, epigastric, and back pain. Pain is significantly improved from the day prior, now rated at 5/10; notably worsens with movement to either side. Denies nausea or emesis. Endorses good appetite; currently n.p.o.   Objective: Vital signs in last 24 hours: Temp:  [98 F (36.7 C)-98.7 F (37.1 C)] 98.7 F (37.1 C) (11/01 0536) Pulse Rate:  [80-134] 104 (11/01 0536) Resp:  [15-34] 20 (11/01 0536) BP: (127-166)/(85-131) 150/96 (11/01 0536) SpO2:  [92 %-100 %] 95 % (11/01 0536) Last BM Date: 09/06/20  Intake/Output from previous day: 10/31 0701 - 11/01 0700 In: 794.2 [I.V.:33.1; IV Piggyback:761.1] Out: -  Intake/Output this shift: No intake/output data recorded.  General appearance: alert, cooperative and no distress Head: Normocephalic, without obvious abnormality, atraumatic Resp: clear to auscultation bilaterally and normal work of breathing GI: soft, mild RUQ-epigastric tenderness. Negative Murphey's sign  Lab Results:  Recent Labs    09/07/20 0914 09/08/20 0424  WBC 6.1 4.8  HGB 16.1* 14.8  HCT 50.1* 46.2*  PLT 183 173   BMET Recent Labs    09/07/20 0914 09/08/20 0424  NA 138 138  K 3.0* 4.2  CL 98 101  CO2 27 24  GLUCOSE 162* 72  BUN 16 13  CREATININE 1.09* 0.86  CALCIUM 8.6* 8.2*   PT/INR Recent Labs    09/07/20 0916 09/08/20 0424  LABPROT 29.2* 31.2*  INR 2.9* 3.1*    Studies/Results: CT ABDOMEN PELVIS W CONTRAST  Result Date: 09/07/2020 CLINICAL DATA:  Abdominal pain, lower back pain for 3 months. EXAM: CT ABDOMEN AND PELVIS WITH CONTRAST TECHNIQUE: Multidetector CT imaging of the abdomen and pelvis was performed using the standard protocol following bolus administration of intravenous contrast. CONTRAST:  88mL OMNIPAQUE IOHEXOL 300 MG/ML  SOLN COMPARISON:  CT abdomen pelvis dated 07/10/2019 and other prior exams. FINDINGS: Lower chest: There are small  bilateral pleural effusions with associated atelectasis. Hepatobiliary: Two hepatic cysts are redemonstrated. There is a gallbladder wall thickening with surrounding edema. No definite gallstones are identified. No biliary dilatation. Pancreas: A 6 mm low attenuating mass in the pancreatic tail is not significantly changed since at least 11/28/2012 and presumed benign. No pancreatic ductal dilatation or surrounding inflammatory changes. Spleen: Normal in size without focal abnormality. Adrenals/Urinary Tract: A 1.5 cm lipid rich adrenal adenoma in the right is unchanged and benign. The left adrenal gland appears normal. Kidneys are normal, without renal calculi, focal lesion, or hydronephrosis. Bladder is unremarkable. Stomach/Bowel: Stomach is within normal limits. Appendix appears normal. There is colonic diverticulosis without evidence of diverticulitis. No evidence of bowel wall thickening, distention, or inflammatory changes. Vascular/Lymphatic: Aortic atherosclerosis. No enlarged abdominal or pelvic lymph nodes. Reproductive: Status post hysterectomy. No adnexal masses. Other: There is moderate volume free intraperitoneal fluid. No abdominal wall hernia is identified. Musculoskeletal: Degenerative changes are seen in the spine. IMPRESSION: 1. Gallbladder wall thickening with surrounding edema may represent acute cholecystitis. Right upper quadrant ultrasound could be performed if the clinically indicated. 2. Moderate volume free intraperitoneal fluid. 3. Small bilateral pleural effusions with associated atelectasis. Aortic Atherosclerosis (ICD10-I70.0). Electronically Signed   By: Zerita Boers M.D.   On: 09/07/2020 10:37   DG Chest Portable 1 View  Result Date: 09/07/2020 CLINICAL DATA:  Pt states she has been having BP issues onset for several days. Pt denies sob, cp, fever, or N/V. No COVID test ordered at this time. HX of hypertension. EXAM: PORTABLE CHEST 1 VIEW  COMPARISON:  10/26/2017.  CT,  11/23/2017. FINDINGS: Heart mildly enlarged.  No mediastinal or hilar masses. Lungs demonstrate vascular prominence. There is interstitial thickening and linear areas of scarring or atelectasis at the lung bases, greater on the left. Additional left lung base opacity obscures hemidiaphragm, which may reflect a pleural effusion in addition to the lung base opacity. No convincing pulmonary edema. No pneumothorax. Skeletal structures are grossly intact. IMPRESSION: 1. Left lung base opacity that is likely a combination of a small effusion and atelectasis. Consider pneumonia if there are consistent clinical findings. 2. Vascular congestion and mild lower lung interstitial thickening without convincing pulmonary edema. Electronically Signed   By: Lajean Manes M.D.   On: 09/07/2020 09:37   US Abdomen Limited RUQ (LIVER/GB)  Result Date: 09/08/2020 CLINICAL DATA:  Right upper quadrant pain. EXAM: ULTRASOUND ABDOMEN LIMITED RIGHT UPPER QUADRANT COMPARISON:  CT abdomen pelvis 09/07/2020 FINDINGS: Gallbladder: Gallbladder wall thickness upper limits of normal. Trace pericholecystic fluid. Negative sonographic Murphy sign. Possible 4 mm gallbladder polyp. Common bile duct: Diameter: 5.7 mm. Liver: No focal lesion identified. Within normal limits in parenchymal echogenicity. Questioned mass adjacent to the liver likely represents small adenoma demonstrated on prior CT. Portal vein is patent on color Doppler imaging with normal direction of blood flow towards the liver. Other: Small right pleural effusion. Enlarged IVC and hepatic veins. IMPRESSION: 1. No cholelithiasis. Gallbladder wall thickness upper limits of normal with trace pericholecystic fluid. Negative sonographic Murphy sign. 2. Enlarged IVC and hepatic veins suggesting some component of right heart failure. Electronically Signed   By: Lovey Newcomer M.D.   On: 09/08/2020 10:16    Anti-infectives: Anti-infectives (From admission, onward)   Start     Dose/Rate  Route Frequency Ordered Stop   09/07/20 1900  piperacillin-tazobactam (ZOSYN) IVPB 3.375 g        3.375 g 12.5 mL/hr over 240 Minutes Intravenous Every 8 hours 09/07/20 1630     09/07/20 1100  piperacillin-tazobactam (ZOSYN) IVPB 3.375 g        3.375 g 100 mL/hr over 30 Minutes Intravenous  Once 09/07/20 1056 09/07/20 1210      Assessment/Plan: Patient is an 79 y.o female with hx significant for Atrial fibrillation on home Coumadin, with potential for acholic cholecystitis per Abdominal CT yesterday; and RUQ ultrasound today conveying similar, in addition to IVC and hepatic vein enlargement suggestive of right heart failure. She remains afebrile, with normal WBC count, though with lactic acid mildly elevated at 2.1. Pain notably improved, though still present.  Neuro: PRN analgesic FEN/GI: Continued monitoring. Advance to full diet.  Cardiac: Continue to hold Coumadin. Echo to evaluate for heart failure.  ID: Continue zosyn therapy.     LOS: 1 day    Janne Lab 09/08/2020

## 2020-09-08 NOTE — Progress Notes (Signed)
PROGRESS NOTE    SAMAIRA Barajas  CXK:[id number] DOB: 06-08-1941 DOA: 09/07/2020 PCP: Loman Brooklyn, FNP    Brief Narrative:  Norma Barajas is a 79 y.o. female with medical history significant of hypertension, hyperlipidemia, atrial fibrillation on anticoagulation, presents to the emergency room with complaints of intermittent abdominal pain for the past 2 months.  Symptoms have progressively gotten worse, particularly in the last 24 hours.  Pain is described in the right upper quadrant, epigastrium radiating to her right upper back.  Symptoms are worse with p.o. intake.  She has had nausea, but no vomiting.  Bowel movements have otherwise been normal.  She is not had any fever.  She also does describe progressive shortness of breath over the last few months.  She reports is easier to breathe when she lays down flat, dyspnea is worse on exertion.  ED Course: She is noted to be tachycardic in atrial fibrillation.  Blood pressure is elevated.  WBC count is normal.  Transaminases also are not elevated, although bilirubin is mildly elevated.  Lactic acid also elevated.  CT of the abdomen and pelvis indicated possible gallbladder wall thickening and pericholecystic fluid.  She has been referred for admission.   Assessment & Plan:   Active Problems:   Hyperlipidemia   Essential hypertension   Atrial fibrillation (HCC)   Chronic anticoagulation   Cholecystitis   Biliary colic   Abdominal pain -Initially, concerns for acalculous cholecystitis based on CT findings -Ultrasound does not show any evidence of stones, negative Murphy sign -She is afebrile, has no leukocytosis and LFTs are otherwise unremarkable -She was initially started on IV antibiotics -Seen by general surgery and it is felt that cholecystitis at this point is less likely -Antibiotics have been discontinued -Started on full liquid diet, continue to monitor response  Atrial fibrillation, chronic -INR is currently  therapeutic -We will hold further Coumadin in case any procedures are needed -Start IV heparin once INR less than 2 -Continue metoprolol for rate control as well as amiodarone  Acute CHF -She does have some evidence of volume overload -Add BNP to monitor labs -Check echocardiogram -Right upper quadrant ultrasound indicated possible element of right-sided heart failure with hepatic congestion -We will give a dose of IV Lasix  Hypertension -Blood pressure currently elevated -Continue amlodipine and metoprolol  Hyperlipidemia -Hold statin for now   DVT prophylaxis: Coumadin  Code Status: DNR Family Communication: Discussed with patient Disposition Plan: Status is: Inpatient  Remains inpatient appropriate because:Ongoing diagnostic testing needed not appropriate for outpatient work up   Dispo: The patient is from: Home              Anticipated d/c is to: Home              Anticipated d/c date is: 2 days              Patient currently is not medically stable to d/c.    Consultants:   General surgery  Procedures:     Antimicrobials:   Zosyn 10/31 > 11/1   Subjective: Continues to have some abdominal discomfort.  Also has back pain.  Denies any shortness of breath.  Objective: Vitals:   09/07/20 2225 09/08/20 0225 09/08/20 0536 09/08/20 1301  BP:  (!) 132/98 (!) 150/96 (!) 147/78  Pulse:  80 (!) 104 88  Resp:  18 20 16   Temp:  98.5 F (36.9 C) 98.7 F (37.1 C) 98.6 F (37 C)  TempSrc: Oral Oral  Oral  SpO2: 95% 98% 95%   Weight:      Height:        Intake/Output Summary (Last 24 hours) at 09/08/2020 1947 Last data filed at 09/08/2020 0630 Gross per 24 hour  Intake 94.19 ml  Output --  Net 94.19 ml   Filed Weights   09/07/20 0857  Weight: 54.4 kg    Examination:  General exam: Appears calm and comfortable  Respiratory system: Crackles at bases. Respiratory effort normal. Cardiovascular system: S1 & S2 heard, RRR. No JVD, murmurs, rubs,  gallops or clicks. 1+ pedal edema. Gastrointestinal system: Abdomen is nondistended, soft and nontender. No organomegaly or masses felt. Normal bowel sounds heard. Central nervous system: Alert and oriented. No focal neurological deficits. Extremities: Symmetric 5 x 5 power. Skin: No rashes, lesions or ulcers Psychiatry: Judgement and insight appear normal. Mood & affect appropriate.     Data Reviewed: I have personally reviewed following labs and imaging studies  CBC: Recent Labs  Lab 09/07/20 0914 09/08/20 0424  WBC 6.1 4.8  NEUTROABS 4.5  --   HGB 16.1* 14.8  HCT 50.1* 46.2*  MCV 95.4 96.3  PLT 183 188   Basic Metabolic Panel: Recent Labs  Lab 09/04/20 1000 09/07/20 0914 09/07/20 1038 09/08/20 0424  NA 144 138  --  138  K 3.5 3.0*  --  4.2  CL 97 98  --  101  CO2 30* 27  --  24  GLUCOSE 89 162*  --  72  BUN 14 16  --  13  CREATININE 1.15* 1.09*  --  0.86  CALCIUM 8.7 8.6*  --  8.2*  MG  --   --  1.7  --    GFR: Estimated Creatinine Clearance: 38.7 mL/min (by C-G formula based on SCr of 0.86 mg/dL). Liver Function Tests: Recent Labs  Lab 09/07/20 0914 09/08/20 0424  AST 29 26  ALT 19 18  ALKPHOS 66 52  BILITOT 1.3* 1.0  PROT 6.6 5.7*  ALBUMIN 3.6 3.1*   Recent Labs  Lab 09/07/20 0914  LIPASE 24   No results for input(s): AMMONIA in the last 168 hours. Coagulation Profile: Recent Labs  Lab 09/04/20 0100 09/04/20 1155 09/07/20 0916 09/08/20 0424  INR 3.6* 3.6* 2.9* 3.1*   Cardiac Enzymes: No results for input(s): CKTOTAL, CKMB, CKMBINDEX, TROPONINI in the last 168 hours. BNP (last 3 results) Recent Labs    08/18/20 1142  PROBNP CANCELED   HbA1C: No results for input(s): HGBA1C in the last 72 hours. CBG: No results for input(s): GLUCAP in the last 168 hours. Lipid Profile: No results for input(s): CHOL, HDL, LDLCALC, TRIG, CHOLHDL, LDLDIRECT in the last 72 hours. Thyroid Function Tests: No results for input(s): TSH, T4TOTAL, FREET4,  T3FREE, THYROIDAB in the last 72 hours. Anemia Panel: No results for input(s): VITAMINB12, FOLATE, FERRITIN, TIBC, IRON, RETICCTPCT in the last 72 hours. Sepsis Labs: Recent Labs  Lab 09/07/20 0914 09/07/20 1038  LATICACIDVEN 2.3* 2.1*    Recent Results (from the past 240 hour(s))  Respiratory Panel by RT PCR (Flu A&B, Covid) - Nasopharyngeal Swab     Status: None   Collection Time: 09/07/20 10:04 AM   Specimen: Nasopharyngeal Swab  Result Value Ref Range Status   SARS Coronavirus 2 by RT PCR NEGATIVE NEGATIVE Final    Comment: (NOTE) SARS-CoV-2 target nucleic acids are NOT DETECTED.  The SARS-CoV-2 RNA is generally detectable in upper respiratoy specimens during the acute phase of infection. The lowest concentration of SARS-CoV-2 viral  copies this assay can detect is 131 copies/mL. A negative result does not preclude SARS-Cov-2 infection and should not be used as the sole basis for treatment or other patient management decisions. A negative result may occur with  improper specimen collection/handling, submission of specimen other than nasopharyngeal swab, presence of viral mutation(s) within the areas targeted by this assay, and inadequate number of viral copies (<131 copies/mL). A negative result must be combined with clinical observations, patient history, and epidemiological information. The expected result is Negative.  Fact Sheet for Patients:  PinkCheek.be  Fact Sheet for Healthcare Providers:  GravelBags.it  This test is no t yet approved or cleared by the [place] FDA and  has been authorized for detection and/or diagnosis of SARS-CoV-2 by FDA under an Emergency Use Authorization (EUA). This EUA will remain  in effect (meaning this test can be used) for the duration of the COVID-19 declaration under Section 564(b)(1) of the Act, 21 U.S.C. section 360bbb-3(b)(1), unless the authorization is terminated  or revoked sooner.     Influenza A by PCR NEGATIVE NEGATIVE Final   Influenza B by PCR NEGATIVE NEGATIVE Final    Comment: (NOTE) The Xpert Xpress SARS-CoV-2/FLU/RSV assay is intended as an aid in  the diagnosis of influenza from Nasopharyngeal swab specimens and  should not be used as a sole basis for treatment. Nasal washings and  aspirates are unacceptable for Xpert Xpress SARS-CoV-2/FLU/RSV  testing.  Fact Sheet for Patients: PinkCheek.be  Fact Sheet for Healthcare Providers: GravelBags.it  This test is not yet approved or cleared by the [place] FDA and  has been authorized for detection and/or diagnosis of SARS-CoV-2 by  FDA under an Emergency Use Authorization (EUA). This EUA will remain  in effect (meaning this test can be used) for the duration of the  Covid-19 declaration under Section 564(b)(1) of the Act, 21  U.S.C. section 360bbb-3(b)(1), unless the authorization is  terminated or revoked. Performed at [facility], [address], [place], [place] [address]          Radiology Studies: CT ABDOMEN PELVIS W CONTRAST  Result Date: 09/07/2020 CLINICAL DATA:  Abdominal pain, lower back pain for 3 months. EXAM: CT ABDOMEN AND PELVIS WITH CONTRAST TECHNIQUE: Multidetector CT imaging of the abdomen and pelvis was performed using the standard protocol following bolus administration of intravenous contrast. CONTRAST:  24mL OMNIPAQUE IOHEXOL 300 MG/ML  SOLN COMPARISON:  CT abdomen pelvis dated 07/10/2019 and other prior exams. FINDINGS: Lower chest: There are small bilateral pleural effusions with associated atelectasis. Hepatobiliary: Two hepatic cysts are redemonstrated. There is a gallbladder wall thickening with surrounding edema. No definite gallstones are identified. No biliary dilatation. Pancreas: A 6 mm low attenuating mass in the pancreatic tail is not significantly changed since at least 11/28/2012 and  presumed benign. No pancreatic ductal dilatation or surrounding inflammatory changes. Spleen: Normal in size without focal abnormality. Adrenals/Urinary Tract: A 1.5 cm lipid rich adrenal adenoma in the right is unchanged and benign. The left adrenal gland appears normal. Kidneys are normal, without renal calculi, focal lesion, or hydronephrosis. Bladder is unremarkable. Stomach/Bowel: Stomach is within normal limits. Appendix appears normal. There is colonic diverticulosis without evidence of diverticulitis. No evidence of bowel wall thickening, distention, or inflammatory changes. Vascular/Lymphatic: Aortic atherosclerosis. No enlarged abdominal or pelvic lymph nodes. Reproductive: Status post hysterectomy. No adnexal masses. Other: There is moderate volume free intraperitoneal fluid. No abdominal wall hernia is identified. Musculoskeletal: Degenerative changes are seen in the spine. IMPRESSION: 1. Gallbladder wall  thickening with surrounding edema may represent acute cholecystitis. Right upper quadrant ultrasound could be performed if the clinically indicated. 2. Moderate volume free intraperitoneal fluid. 3. Small bilateral pleural effusions with associated atelectasis. Aortic Atherosclerosis (ICD10-I70.0). Electronically Signed   By: Zerita Boers M.D.   On: 09/07/2020 10:37   DG Chest Portable 1 View  Result Date: 09/07/2020 CLINICAL DATA:  Pt states she has been having BP issues onset for several days. Pt denies sob, cp, fever, or N/V. No COVID test ordered at this time. HX of hypertension. EXAM: PORTABLE CHEST 1 VIEW COMPARISON:  10/26/2017.  CT, 11/23/2017. FINDINGS: Heart mildly enlarged.  No mediastinal or hilar masses. Lungs demonstrate vascular prominence. There is interstitial thickening and linear areas of scarring or atelectasis at the lung bases, greater on the left. Additional left lung base opacity obscures hemidiaphragm, which may reflect a pleural effusion in addition to the lung base  opacity. No convincing pulmonary edema. No pneumothorax. Skeletal structures are grossly intact. IMPRESSION: 1. Left lung base opacity that is likely a combination of a small effusion and atelectasis. Consider pneumonia if there are consistent clinical findings. 2. Vascular congestion and mild lower lung interstitial thickening without convincing pulmonary edema. Electronically Signed   By: Lajean Manes M.D.   On: 09/07/2020 09:37   US Abdomen Limited RUQ (LIVER/GB)  Result Date: 09/08/2020 CLINICAL DATA:  Right upper quadrant pain. EXAM: ULTRASOUND ABDOMEN LIMITED RIGHT UPPER QUADRANT COMPARISON:  CT abdomen pelvis 09/07/2020 FINDINGS: Gallbladder: Gallbladder wall thickness upper limits of normal. Trace pericholecystic fluid. Negative sonographic Murphy sign. Possible 4 mm gallbladder polyp. Common bile duct: Diameter: 5.7 mm. Liver: No focal lesion identified. Within normal limits in parenchymal echogenicity. Questioned mass adjacent to the liver likely represents small adenoma demonstrated on prior CT. Portal vein is patent on color Doppler imaging with normal direction of blood flow towards the liver. Other: Small right pleural effusion. Enlarged IVC and hepatic veins. IMPRESSION: 1. No cholelithiasis. Gallbladder wall thickness upper limits of normal with trace pericholecystic fluid. Negative sonographic Murphy sign. 2. Enlarged IVC and hepatic veins suggesting some component of right heart failure. Electronically Signed   By: Lovey Newcomer M.D.   On: 09/08/2020 10:16        Scheduled Meds: . amiodarone  200 mg Oral Daily  . amLODipine  10 mg Oral Daily  . furosemide  40 mg Intravenous Once  . metoprolol tartrate  50 mg Oral BID  . pantoprazole  40 mg Oral Daily   Continuous Infusions: . sodium chloride 500 mL (09/07/20 1833)     LOS: 1 day    Time spent: 74mins    Kathie Dike, MD [facility]   If 7PM-7AM, please contact night-coverage [email or web address]  09/08/2020, 7:47  PM

## 2020-09-08 NOTE — Plan of Care (Signed)

## 2020-09-09 ENCOUNTER — Inpatient Hospital Stay (HOSPITAL_COMMUNITY): Payer: Medicare HMO

## 2020-09-09 DIAGNOSIS — I5031 Acute diastolic (congestive) heart failure: Secondary | ICD-10-CM

## 2020-09-09 DIAGNOSIS — K819 Cholecystitis, unspecified: Secondary | ICD-10-CM

## 2020-09-09 LAB — COMPREHENSIVE METABOLIC PANEL
ALT: 18 U/L (ref 0–44)
AST: 28 U/L (ref 15–41)
Albumin: 3.4 g/dL — ABNORMAL LOW (ref 3.5–5.0)
Alkaline Phosphatase: 56 U/L (ref 38–126)
Anion gap: 13 (ref 5–15)
BUN: 14 mg/dL (ref 8–23)
CO2: 28 mmol/L (ref 22–32)
Calcium: 8.7 mg/dL — ABNORMAL LOW (ref 8.9–10.3)
Chloride: 98 mmol/L (ref 98–111)
Creatinine, Ser: 0.97 mg/dL (ref 0.44–1.00)
GFR, Estimated: 59 mL/min — ABNORMAL LOW (ref >60)
Glucose, Bld: 85 mg/dL (ref 70–99)
Potassium: 3.2 mmol/L — ABNORMAL LOW (ref 3.5–5.1)
Sodium: 139 mmol/L (ref 135–145)
Total Bilirubin: 1.3 mg/dL — ABNORMAL HIGH (ref 0.3–1.2)
Total Protein: 6.2 g/dL — ABNORMAL LOW (ref 6.5–8.1)

## 2020-09-09 LAB — ECHOCARDIOGRAM COMPLETE
AR max vel: 1.4 cm2
AV Area VTI: 1.38 cm2
AV Area mean vel: 1.32 cm2
AV Mean grad: 2.6 mmHg
AV Peak grad: 4.8 mmHg
Ao pk vel: 1.1 m/s
Area-P 1/2: 5.16 cm2
Calc EF: 44.1 %
Height: 61 in
MV M vel: 5.41 m/s
MV Peak grad: 117.1 mmHg
P 1/2 time: 450 msec
Radius: 0.2 cm
S' Lateral: 2.8 cm
Single Plane A2C EF: 40.2 %
Single Plane A4C EF: 54.6 %
Weight: 1920 oz

## 2020-09-09 LAB — CBC
HCT: 47.4 % — ABNORMAL HIGH (ref 36.0–46.0)
Hemoglobin: 15.1 g/dL — ABNORMAL HIGH (ref 12.0–15.0)
MCH: 30.7 pg (ref 26.0–34.0)
MCHC: 31.9 g/dL (ref 30.0–36.0)
MCV: 96.3 fL (ref 80.0–100.0)
Platelets: 177 10*3/uL (ref 150–400)
RBC: 4.92 MIL/uL (ref 3.87–5.11)
RDW: 19.3 % — ABNORMAL HIGH (ref 11.5–15.5)
WBC: 6.1 10*3/uL (ref 4.0–10.5)
nRBC: 0 % (ref 0.0–0.2)

## 2020-09-09 LAB — PROTIME-INR
INR: 2.9 — ABNORMAL HIGH (ref 0.8–1.2)
Prothrombin Time: 29.3 seconds — ABNORMAL HIGH (ref 11.4–15.2)

## 2020-09-09 LAB — D-DIMER, QUANTITATIVE: D-Dimer, Quant: 1.06 ug/mL-FEU — ABNORMAL HIGH (ref 0.00–0.50)

## 2020-09-09 MED ORDER — POTASSIUM CHLORIDE CRYS ER 20 MEQ PO TBCR
40.0000 meq | EXTENDED_RELEASE_TABLET | Freq: Once | ORAL | Status: DC
  Filled 2020-09-09: qty 2

## 2020-09-09 MED ORDER — BOOST / RESOURCE BREEZE PO LIQD CUSTOM
1.0000 | Freq: Two times a day (BID) | ORAL | Status: DC
  Administered 2020-09-09: 1 via ORAL

## 2020-09-09 MED ORDER — ADULT MULTIVITAMIN W/MINERALS CH
1.0000 | ORAL_TABLET | Freq: Every day | ORAL | Status: DC
  Administered 2020-09-09 – 2020-09-12 (×4): 1 via ORAL
  Filled 2020-09-09 (×6): qty 1

## 2020-09-09 MED ORDER — FUROSEMIDE 40 MG PO TABS
40.0000 mg | ORAL_TABLET | Freq: Every day | ORAL | Status: DC
  Administered 2020-09-09 – 2020-09-10 (×2): 40 mg via ORAL
  Filled 2020-09-09 (×2): qty 1

## 2020-09-09 NOTE — Progress Notes (Signed)
*  PRELIMINARY RESULTS* Echocardiogram 2D Echocardiogram has been performed.  Norma Barajas 09/09/2020, 12:49 PM

## 2020-09-09 NOTE — Progress Notes (Addendum)
PROGRESS NOTE    Norma Barajas  UJW:[id number] DOB: June 05, 1941 DOA: 09/07/2020 PCP: Loman Brooklyn, FNP    Brief Narrative:  Norma Barajas is a 79 y.o. female with medical history significant of hypertension, hyperlipidemia, atrial fibrillation on anticoagulation, presents to the emergency room with complaints of intermittent abdominal pain for the past 2 months.  Symptoms have progressively gotten worse, particularly in the last 24 hours.  Pain is described in the right upper quadrant, epigastrium radiating to her right upper back.  Symptoms are worse with p.o. intake.  She has had nausea, but no vomiting.  Bowel movements have otherwise been normal.  She is not had any fever.  She also does describe progressive shortness of breath over the last few months.  She reports is easier to breathe when she lays down flat, dyspnea is worse on exertion.   ED Course: She is noted to be tachycardic in atrial fibrillation.  Blood pressure is elevated.  WBC count is normal.  Transaminases also are not elevated, although bilirubin is mildly elevated.  Lactic acid also elevated.  CT of the abdomen and pelvis indicated possible gallbladder wall thickening and pericholecystic fluid.  She has been referred for admission.   Assessment & Plan:   Active Problems:   Hyperlipidemia   Essential hypertension   Atrial fibrillation (HCC)   Chronic anticoagulation   Cholecystitis   Biliary colic   Abdominal pain -Initially, concerns for acalculous cholecystitis based on CT findings -Ultrasound does not show any evidence of stones, negative Murphy sign -She is afebrile, has no leukocytosis and LFTs are otherwise unremarkable -She was initially started on IV antibiotics -Seen by general surgery and it is felt that cholecystitis at this point is less likely -Antibiotics have been discontinued -Tolerating liquid diet, advanced to solid food, monitor for tolerance -follow up with GI, Dr. Laural Golden   Atrial  fibrillation, chronic -INR is currently therapeutic -Pharmacy to dose coumadin -Continue metoprolol for rate control as well as amiodarone  Acute right sided CHF -She does have some evidence of volume overload -Add BNP to monitor labs -Echo shows normal EF, but RV function is moderately reduced. RV is moderately dilated -? History of VTE -check D dimer and if elevated would consider VQ scan vs. CTA chest -if either of these studies are positive, this would indicate a coumadin failure -She received a dose of lasix on 11/1 -volume status appears to be improving -will start on oral lasix  Dysphagia -patient describes difficulty swallowing her pills and food -will request speech therapy evaluation  Generalized weakness -physical therapy evaluation   Hypertension -Blood pressure currently elevated -Continue amlodipine and metoprolol   Hyperlipidemia -Hold statin for now  Hypokalemia -Replace   DVT prophylaxis: Coumadin  Code Status: DNR Family Communication: Discussed with patient Disposition Plan: Status is: Inpatient  Remains inpatient appropriate because:Ongoing diagnostic testing needed not appropriate for outpatient work up   Dispo: The patient is from: Home              Anticipated d/c is to: Home              Anticipated d/c date is: 2 days              Patient currently is not medically stable to d/c.    Consultants:  General surgery  Procedures:    Antimicrobials:  Zosyn 10/31 > 11/1   Subjective: Patient reports continued shortness of breath with exertion. Abdominal pain is better. She was tolerating  liquids. Diet advanced to solids today. She feels that her pills and occasionally food gets stuck in her throat and she has difficulty swallowing  Objective: Vitals:   09/08/20 1301 09/08/20 2055 09/09/20 0453 09/09/20 1305  BP: (!) 147/78 (!) 149/105 (!) 145/112 (!) 128/103  Pulse: 88 99 100 (!) 110  Resp: 16 16 16 18   Temp: 98.6 F (37 C) (!)  97.5 F (36.4 C) 98 F (36.7 C) 98.2 F (36.8 C)  TempSrc: Oral Oral Oral Oral  SpO2:  95% 96% 95%  Weight:      Height:        Intake/Output Summary (Last 24 hours) at 09/09/2020 1856 Last data filed at 09/09/2020 1300 Gross per 24 hour  Intake 720 ml  Output --  Net 720 ml   Filed Weights   09/07/20 0857  Weight: 54.4 kg    Examination:  General exam: Alert, awake, oriented x 3 Respiratory system: Clear to auscultation. Respiratory effort normal. Cardiovascular system:RRR. No murmurs, rubs, gallops. Gastrointestinal system: Abdomen is nondistended, soft and nontender. No organomegaly or masses felt. Normal bowel sounds heard. Central nervous system: Alert and oriented. No focal neurological deficits. Extremities: No C/C/E, +pedal pulses Skin: No rashes, lesions or ulcers Psychiatry: Judgement and insight appear normal. Mood & affect appropriate.    Data Reviewed: I have personally reviewed following labs and imaging studies  CBC: Recent Labs  Lab 09/07/20 0914 09/08/20 0424 09/09/20 0553  WBC 6.1 4.8 6.1  NEUTROABS 4.5  --   --   HGB 16.1* 14.8 15.1*  HCT 50.1* 46.2* 47.4*  MCV 95.4 96.3 96.3  PLT 183 173 948   Basic Metabolic Panel: Recent Labs  Lab 09/04/20 1000 09/07/20 0914 09/07/20 1038 09/08/20 0424 09/09/20 0553  NA 144 138  --  138 139  K 3.5 3.0*  --  4.2 3.2*  CL 97 98  --  101 98  CO2 30* 27  --  24 28  GLUCOSE 89 162*  --  72 85  BUN 14 16  --  13 14  CREATININE 1.15* 1.09*  --  0.86 0.97  CALCIUM 8.7 8.6*  --  8.2* 8.7*  MG  --   --  1.7  --   --    GFR: Estimated Creatinine Clearance: 34.3 mL/min (by C-G formula based on SCr of 0.97 mg/dL). Liver Function Tests: Recent Labs  Lab 09/07/20 0914 09/08/20 0424 09/09/20 0553  AST 29 26 28   ALT 19 18 18   ALKPHOS 66 52 56  BILITOT 1.3* 1.0 1.3*  PROT 6.6 5.7* 6.2*  ALBUMIN 3.6 3.1* 3.4*   Recent Labs  Lab 09/07/20 0914  LIPASE 24   No results for input(s): AMMONIA in the  last 168 hours. Coagulation Profile: Recent Labs  Lab 09/04/20 0100 09/04/20 1155 09/07/20 0916 09/08/20 0424 09/09/20 0553  INR 3.6* 3.6* 2.9* 3.1* 2.9*   Cardiac Enzymes: No results for input(s): CKTOTAL, CKMB, CKMBINDEX, TROPONINI in the last 168 hours. BNP (last 3 results) Recent Labs    08/18/20 1142  PROBNP CANCELED   HbA1C: No results for input(s): HGBA1C in the last 72 hours. CBG: No results for input(s): GLUCAP in the last 168 hours. Lipid Profile: No results for input(s): CHOL, HDL, LDLCALC, TRIG, CHOLHDL, LDLDIRECT in the last 72 hours. Thyroid Function Tests: No results for input(s): TSH, T4TOTAL, FREET4, T3FREE, THYROIDAB in the last 72 hours. Anemia Panel: No results for input(s): VITAMINB12, FOLATE, FERRITIN, TIBC, IRON, RETICCTPCT in the last 72  hours. Sepsis Labs: Recent Labs  Lab 09/07/20 0914 09/07/20 1038  LATICACIDVEN 2.3* 2.1*    Recent Results (from the past 240 hour(s))  Respiratory Panel by RT PCR (Flu A&B, Covid) - Nasopharyngeal Swab     Status: None   Collection Time: 09/07/20 10:04 AM   Specimen: Nasopharyngeal Swab  Result Value Ref Range Status   SARS Coronavirus 2 by RT PCR NEGATIVE NEGATIVE Final    Comment: (NOTE) SARS-CoV-2 target nucleic acids are NOT DETECTED.  The SARS-CoV-2 RNA is generally detectable in upper respiratoy specimens during the acute phase of infection. The lowest concentration of SARS-CoV-2 viral copies this assay can detect is 131 copies/mL. A negative result does not preclude SARS-Cov-2 infection and should not be used as the sole basis for treatment or other patient management decisions. A negative result may occur with  improper specimen collection/handling, submission of specimen other than nasopharyngeal swab, presence of viral mutation(s) within the areas targeted by this assay, and inadequate number of viral copies (<131 copies/mL). A negative result must be combined with clinical observations,  patient history, and epidemiological information. The expected result is Negative.  Fact Sheet for Patients:  PinkCheek.be  Fact Sheet for Healthcare Providers:  GravelBags.it  This test is no t yet approved or cleared by the [place] FDA and  has been authorized for detection and/or diagnosis of SARS-CoV-2 by FDA under an Emergency Use Authorization (EUA). This EUA will remain  in effect (meaning this test can be used) for the duration of the COVID-19 declaration under Section 564(b)(1) of the Act, 21 U.S.C. section 360bbb-3(b)(1), unless the authorization is terminated or revoked sooner.     Influenza A by PCR NEGATIVE NEGATIVE Final   Influenza B by PCR NEGATIVE NEGATIVE Final    Comment: (NOTE) The Xpert Xpress SARS-CoV-2/FLU/RSV assay is intended as an aid in  the diagnosis of influenza from Nasopharyngeal swab specimens and  should not be used as a sole basis for treatment. Nasal washings and  aspirates are unacceptable for Xpert Xpress SARS-CoV-2/FLU/RSV  testing.  Fact Sheet for Patients: PinkCheek.be  Fact Sheet for Healthcare Providers: GravelBags.it  This test is not yet approved or cleared by the [place] FDA and  has been authorized for detection and/or diagnosis of SARS-CoV-2 by  FDA under an Emergency Use Authorization (EUA). This EUA will remain  in effect (meaning this test can be used) for the duration of the  Covid-19 declaration under Section 564(b)(1) of the Act, 21  U.S.C. section 360bbb-3(b)(1), unless the authorization is  terminated or revoked. Performed at [facility], [address] [place], [place] [address]          Radiology Studies: ECHOCARDIOGRAM COMPLETE  Result Date: 09/09/2020    ECHOCARDIOGRAM REPORT   Patient Name:   Norma Barajas Date of Exam: 09/09/2020 Medical Rec #:  [id number]   Height:       61.0 in  Accession #:    [id number]  Weight:       120.0 lb Date of Birth:  1941-02-10   BSA:          1.520 m Patient Age:    16 years    BP:           145/112 mmHg Patient Gender: F           HR:           100 bpm. Exam Location:  [facility] Procedure: 2D Echo, Cardiac Doppler and Color Doppler Indications:  Congestive Heart Failure 428.0 / I50.9  History:        Patient has prior history of Echocardiogram examinations, most                 recent 04/11/2015. Arrythmias:Atrial Fibrillation,                 Signs/Symptoms:Murmur; Risk Factors:Hypertension and                 Dyslipidemia. Chronic anticoagulation.  Sonographer:    Alvino Chapel RCS Referring Phys: [place]  1. Left ventricular ejection fraction, by estimation, is 50 to 55%. The left ventricle has low normal function. The left ventricle has no regional wall motion abnormalities. Left ventricular diastolic parameters are indeterminate.  2. Ventricular septum is flattened in systole and diastole suggesting RV pressure and volume overload. . Right ventricular systolic function is moderately reduced. The right ventricular size is moderately enlarged.  3. Left atrial size was severely dilated.  4. Right atrial size was severely dilated.  5. The mitral valve is normal in structure. Mild to moderate mitral valve regurgitation. No evidence of mitral stenosis.  6. The tricuspid valve is abnormal. Tricuspid valve regurgitation is severe.  7. The aortic valve is tricuspid. Aortic valve regurgitation is mild.  8. The inferior vena cava is dilated in size with <50% respiratory variability, suggesting right atrial pressure of 15 mmHg. FINDINGS  Left Ventricle: Left ventricular ejection fraction, by estimation, is 50 to 55%. The left ventricle has low normal function. The left ventricle has no regional wall motion abnormalities. The left ventricular internal cavity size was normal in size. There is no left ventricular hypertrophy. Left ventricular  diastolic parameters are indeterminate. Right Ventricle: Ventricular septum is flattened in systole and diastole suggesting RV pressure and volume overload. The right ventricular size is moderately enlarged. Right vetricular wall thickness was not assessed. Right ventricular systolic function is moderately reduced. Left Atrium: Left atrial size was severely dilated. Right Atrium: Right atrial size was severely dilated. Pericardium: There is no evidence of pericardial effusion. Mitral Valve: The mitral valve is normal in structure. Mild to moderate mitral valve regurgitation. No evidence of mitral valve stenosis. Tricuspid Valve: The tricuspid valve is abnormal. Tricuspid valve regurgitation is severe. No evidence of tricuspid stenosis. Aortic Valve: The aortic valve is tricuspid. Aortic valve regurgitation is mild. Aortic regurgitation PHT measures 450 msec. Aortic valve mean gradient measures 2.6 mmHg. Aortic valve peak gradient measures 4.8 mmHg. Aortic valve area, by VTI measures 1.38 cm. Pulmonic Valve: The pulmonic valve was not well visualized. Pulmonic valve regurgitation is not visualized. No evidence of pulmonic stenosis. Aorta: The aortic root is normal in size and structure. Pulmonary Artery: Mild pulmonary HTN, PASP is 35 mmHg. Venous: The inferior vena cava is dilated in size with less than 50% respiratory variability, suggesting right atrial pressure of 15 mmHg. IAS/Shunts: The interatrial septum was not well visualized.  LEFT VENTRICLE PLAX 2D LVIDd:         3.73 cm LVIDs:         2.80 cm LV PW:         0.81 cm LV IVS:        0.87 cm LVOT diam:     1.60 cm LV SV:         25 LV SV Index:   16 LVOT Area:     2.01 cm  LV Volumes (MOD) LV vol d, MOD A2C: 44.7 ml LV vol d,  MOD A4C: 39.9 ml LV vol s, MOD A2C: 26.7 ml LV vol s, MOD A4C: 18.1 ml LV SV MOD A2C:     18.0 ml LV SV MOD A4C:     39.9 ml LV SV MOD BP:      19.1 ml RIGHT VENTRICLE RV S prime:     8.00 cm/s TAPSE (M-mode): 1.4 cm LEFT ATRIUM              Index       RIGHT ATRIUM           Index LA diam:        3.80 cm 2.50 cm/m  RA Area:     23.40 cm LA Vol (A2C):   48.7 ml 32.04 ml/m RA Volume:   74.60 ml  49.07 ml/m LA Vol (A4C):   70.8 ml 46.57 ml/m LA Biplane Vol: 59.9 ml 39.40 ml/m  AORTIC VALVE AV Area (Vmax):    1.40 cm AV Area (Vmean):   1.32 cm AV Area (VTI):     1.38 cm AV Vmax:           109.68 cm/s AV Vmean:          76.432 cm/s AV VTI:            0.178 m AV Peak Grad:      4.8 mmHg AV Mean Grad:      2.6 mmHg LVOT Vmax:         76.30 cm/s LVOT Vmean:        50.350 cm/s LVOT VTI:          0.122 m LVOT/AV VTI ratio: 0.69 AI PHT:            450 msec  AORTA Ao Root diam: 3.10 cm MITRAL VALVE                 TRICUSPID VALVE MV Area (PHT): 5.16 cm      TR Peak grad:   19.9 mmHg MV Decel Time: 147 msec      TR Vmax:        223.00 cm/s MR Peak grad:    117.1 mmHg MR Mean grad:    72.0 mmHg   SHUNTS MR Vmax:         541.00 cm/s Systemic VTI:  0.12 m MR Vmean:        392.0 cm/s  Systemic Diam: 1.60 cm MR PISA:         0.25 cm MR PISA Eff ROA: 1 mm MR PISA Radius:  0.20 cm MV E velocity: 86.30 cm/s Carlyle Dolly MD Electronically signed by Carlyle Dolly MD Signature Date/Time: 09/09/2020/1:13:32 PM    Final    US Abdomen Limited RUQ (LIVER/GB)  Result Date: 09/08/2020 CLINICAL DATA:  Right upper quadrant pain. EXAM: ULTRASOUND ABDOMEN LIMITED RIGHT UPPER QUADRANT COMPARISON:  CT abdomen pelvis 09/07/2020 FINDINGS: Gallbladder: Gallbladder wall thickness upper limits of normal. Trace pericholecystic fluid. Negative sonographic Murphy sign. Possible 4 mm gallbladder polyp. Common bile duct: Diameter: 5.7 mm. Liver: No focal lesion identified. Within normal limits in parenchymal echogenicity. Questioned mass adjacent to the liver likely represents small adenoma demonstrated on prior CT. Portal vein is patent on color Doppler imaging with normal direction of blood flow towards the liver. Other: Small right pleural effusion. Enlarged IVC and  hepatic veins. IMPRESSION: 1. No cholelithiasis. Gallbladder wall thickness upper limits of normal with trace pericholecystic fluid. Negative sonographic Murphy sign. 2. Enlarged IVC and hepatic veins suggesting  some component of right heart failure. Electronically Signed   By: Lovey Newcomer M.D.   On: 09/08/2020 10:16        Scheduled Meds:  amiodarone  200 mg Oral Daily   amLODipine  10 mg Oral Daily   feeding supplement  1 Container Oral BID BM   metoprolol tartrate  50 mg Oral BID   multivitamin with minerals  1 tablet Oral Daily   pantoprazole  40 mg Oral Daily   saccharomyces boulardii  250 mg Oral BID   Continuous Infusions:  sodium chloride 500 mL (09/07/20 1833)     LOS: 2 days    Time spent: 74mins    Kathie Dike, MD [facility]   If 7PM-7AM, please contact night-coverage [email or web address]  09/09/2020, 6:56 PM

## 2020-09-09 NOTE — Progress Notes (Signed)
ANTICOAGULATION CONSULT NOTE -  Pharmacy Consult for heparin gtt  Indication: atrial fibrillation  Allergies  Allergen Reactions  . Codeine Nausea And Vomiting  . Doxycycline Other (See Comments)    Chest congestion  . Triamterene-Hctz Other (See Comments)    weakness  . Atorvastatin Other (See Comments)    Myalgias   . Crestor [Rosuvastatin Calcium] Other (See Comments)    weakness  . Morphine Nausea Only  . Risedronate Sodium Other (See Comments)    ACTONEL - reflux    Patient Measurements: Height: 5\' 1"  (154.9 cm) Weight: 54.4 kg (120 lb) IBW/kg (Calculated) : 47.8 HEPARIN DW (KG): 54.4   Vital Signs: Temp: 98 F (36.7 C) (11/02 0453) Temp Source: Oral (11/02 0453) BP: 145/112 (11/02 0453) Pulse Rate: 100 (11/02 0453)  Labs: Recent Labs    09/07/20 0914 09/07/20 0914 09/07/20 0916 09/08/20 0424 09/09/20 0553  HGB 16.1*   < >  --  14.8 15.1*  HCT 50.1*  --   --  46.2* 47.4*  PLT 183  --   --  173 177  LABPROT  --   --  29.2* 31.2* 29.3*  INR  --   --  2.9* 3.1* 2.9*  CREATININE 1.09*  --   --  0.86 0.97   < > = values in this interval not displayed.    Estimated Creatinine Clearance: 34.3 mL/min (by C-G formula based on SCr of 0.97 mg/dL).   Medical History: Past Medical History:  Diagnosis Date  . 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 Anxiety   . Arthritis    "right leg" (04/11/2015)  . BPPV (benign paroxysmal positional vertigo) 03/23/2016  . Cataract   . Coronary atherosclerosis of native coronary artery    a. Nonobstructive minimal CAD 10/2005.  Marland Kitchen Depression   . Diastolic dysfunction    Grade 1. Ejection fraction 60-65%.  Marland Kitchen Dysrhythmia    a fib  . Erosive esophagitis   . Essential hypertension   . GERD (gastroesophageal reflux disease)   . Grade III hemorrhoids   . History of hiatal hernia   . Hypercholesterolemia   . Internal hemorrhoids with complication 5/80/9983   OCT 2015 FLEX SIG/IH BANDING    . Migraine    "used to  have them right bad; I don't now" (04/11/2015)  . MITRAL REGURGITATION 04/27/2010   Qualifier: Diagnosis of  By: [name], MD, [name]   . Osteoporosis   . Paroxysmal atrial fibrillation (HCC)   . PONV (postoperative nausea and vomiting)   . Vertigo     Medications:  Medications Prior to Admission  Medication Sig Dispense Refill Last Dose  . acetaminophen (TYLENOL) 500 MG tablet Take 1,000 mg by mouth 2 (two) times daily.    Past Week at Unknown time  . amiodarone (PACERONE) 200 MG tablet Take 1 tablet (200 mg total) by mouth daily. 90 tablet 1 09/06/2020 at Unknown time  . amLODipine (NORVASC) 10 MG tablet TAKE 1 TABLET EVERY DAY (Patient taking differently: Take 10 mg by mouth daily. ) 90 tablet 0 09/06/2020 at Unknown time  . cholecalciferol (VITAMIN D3) 25 MCG (1000 UT) tablet Take 1,000 Units by mouth daily.   09/06/2020 at Unknown time  . furosemide (LASIX) 40 MG tablet Take 1 tablet (40 mg total) by mouth daily. 90 tablet 2 09/06/2020 at Unknown time  . lisinopril (ZESTRIL) 10 MG tablet Take 1 tablet (10 mg total) by mouth daily. 90 tablet 0 09/06/2020 at Unknown  time  . loratadine (CLARITIN) 10 MG tablet Take 10 mg by mouth daily as needed for allergies.   unk  . metoprolol tartrate (LOPRESSOR) 50 MG tablet Take 1 tablet (50 mg total) by mouth 2 (two) times daily. 180 tablet 3 09/06/2020 at 1800  . Multiple Vitamin (MULTIVITAMIN) capsule Take 1 capsule by mouth every evening.   09/06/2020 at Unknown time  . Omega-3 Fatty Acids (FISH OIL) 1000 MG CAPS Take 1,000 mg by mouth 2 (two) times daily.    09/06/2020 at Unknown time  . omeprazole (PRILOSEC) 20 MG capsule Take 1 capsule (20 mg total) by mouth daily. 90 capsule 3 09/07/2020 at Unknown time  . potassium chloride (KLOR-CON) 10 MEQ tablet Take 3 tablets (30 mEq total) by mouth daily with breakfast. 60 tablet 3 09/06/2020 at Unknown time  . simvastatin (ZOCOR) 20 MG tablet Take 1 tablet (20 mg total) by mouth daily. 90 tablet  1 09/06/2020 at Unknown time  . warfarin (COUMADIN) 3 MG tablet TAKE 1/2 TO 1 TABLET EVERY DAY AS DIRECTED BY  ANTICOAGULATION CLINIC (Patient taking differently: Take 1.5-3 mg by mouth daily. ) 90 tablet 0 09/06/2020 at 1800   Scheduled:  . amiodarone  200 mg Oral Daily  . amLODipine  10 mg Oral Daily  . metoprolol tartrate  50 mg Oral BID  . pantoprazole  40 mg Oral Daily  . saccharomyces boulardii  250 mg Oral BID   Infusions:  . sodium chloride 500 mL (09/07/20 1833)   PRN: sodium chloride, acetaminophen **OR** acetaminophen, fentaNYL (SUBLIMAZE) injection, ondansetron **OR** ondansetron (ZOFRAN) IV Anti-infectives (From admission, onward)   Start     Dose/Rate Route Frequency Ordered Stop   09/07/20 1900  piperacillin-tazobactam (ZOSYN) IVPB 3.375 g  Status:  Discontinued        3.375 g 12.5 mL/hr over 240 Minutes Intravenous Every 8 hours 09/07/20 1630 09/08/20 1155   09/07/20 1100  piperacillin-tazobactam (ZOSYN) IVPB 3.375 g        3.375 g 100 mL/hr over 30 Minutes Intravenous  Once 09/07/20 1056 09/07/20 1210      Assessment: Norma Barajas a 79 y.o. female requires anticoagulation with a heparin iv infusion for the indication of  atrial fibrillation. Heparin gtt will be started following pharmacy protocol per pharmacy consult. Patient is on warfarin prior to admission - admit INR 2.9 therapeutic  Discussed with Dr Roderic Palau he would like to start heparin once INR < 2.0 INR 2.9 today.  Goal of Therapy:  Heparin level 0.3-0.7 units/ml Monitor platelets by anticoagulation protocol: Yes   Plan:  Start heparin gtt once INR <2.0 INR & CBC daily ordered Monitor for signs and symptoms of bleeding   Isac Sarna, BS Vena [place], BCPS Clinical Pharmacist Pager [phone] 09/09/2020,11:54 AM

## 2020-09-09 NOTE — Progress Notes (Signed)
Initial Nutrition Assessment  DOCUMENTATION CODES:   Not applicable  INTERVENTION:  Boost Breeze po BID, each supplement provides 250 kcal and 9 grams of protein  MVI with minerals daily  Encouraged po intake   NUTRITION DIAGNOSIS:   Inadequate oral intake related to poor appetite as evidenced by per patient/family report.    GOAL:   Patient will meet greater than or equal to 90% of their needs    MONITOR:   PO intake, Weight trends, Labs, I & O's, Supplement acceptance  REASON FOR ASSESSMENT:   Malnutrition Screening Tool    ASSESSMENT:  79 year old female with history of HTN, HLD, atrial fibrillation on anticoagulation, dCHF, erosive esophagitis, GERD, and osteoporosis presented with complaints of intermittent abdominal pain for the past 2 months that has acutely worsened in the last 24 hours. Pt admitted with cholecystitis.  Pt awake sitting up in bed, husband present in room this morning. She reports feeling some better today and is hungry.  Pt endorsed multiple episodes of diarrhea last night, none today. Diet advanced to [facility], she is looking forward to eating "real food" and asking what to eat. RD educated on soft foods that are easy to digest and recommended small frequent meals/snacks verses larger meals. Pt states that she has not had much of an appetite over the past 2 months. Recalls a piece of toast with medications in the morning, sometimes she will have a small bowl of oatmeal and snacks on Doritios throughout the day. Her and her husband like to eat out for dinner, recalls usually ordering a vegetable plate (salad, pintos, turnips, mashed potatoes). Per chart, weights have been slowly trending down over the past few months. At [facility] on 7/23 she weighed 57.1 kg (125.62 lbs), at [facility] on 8/20 she weighed 55.3 kg (121.66 lbs), on 9/07 she weighed 55.2 kg (121.44 lbs), on 10/01 she weighed 53.8 kg (118.36 lbs), and currently she weighs 54.4 kg (119.68 lbs) This indicates ~6  lb (4.7%) wt loss over the last 3 months which is insignificant for time frame, however concerning given advanced age. She has tried supplements in the past, reports unable to tolerate. Pt amenable to trying Boost Breeze to aid with meeting needs as needed.   Medications reviewed and include: Protonix, Florastor  Labs: K 3.2 (L), Hgb 15.1 (H), HCT 47.4 (HCT)  NUTRITION - FOCUSED PHYSICAL EXAM: Mild orbital, buccal fat depletion; Mild clavicle muscle depletion; Mild BLE edema   Diet Order:   Diet Order            Diet Heart Room service appropriate? Yes; Fluid consistency: Thin  Diet effective now                 EDUCATION NEEDS:   Education needs have been addressed  Skin:  Skin Assessment: Reviewed RN Assessment  Last BM:  11/01-type 7 (per pt)  Height:   Ht Readings from Last 1 Encounters:  09/07/20 5\' 1"  (1.549 m)    Weight:   Wt Readings from Last 1 Encounters:  09/07/20 54.4 kg    BMI:  Body mass index is 22.67 kg/m.  Estimated Nutritional Needs:   Kcal:  1500-1700  Protein:  70-80  Fluid:  >1.3 L.day    Lajuan Lines, RD, LDN Clinical Nutrition After Hours/Weekend Pager # in [phone]

## 2020-09-09 NOTE — Progress Notes (Signed)
[facility] Progress Note     Subjective: Patient reports an episode of sharp pan-upper quadrant pain most prominent on the right, as well as back pain earlier today that subsided following analgesic. Additionally reports loose stools x4 without blood starting this morning, attributed to liquid diet. Endorses intermittent nausea, no emesis. Patient had been given lasix yesterday for symptomatic relief for suspected heart failure per ultrasound (echo results pending).   Objective: Vital signs in last 24 hours: Temp:  [97.5 F (36.4 C)-98.6 F (37 C)] 98 F (36.7 C) (11/02 0453) Pulse Rate:  [88-100] 100 (11/02 0453) Resp:  [16] 16 (11/02 0453) BP: (145-149)/(78-112) 145/112 (11/02 0453) SpO2:  [95 %-96 %] 96 % (11/02 0453) Last BM Date: 09/06/20  Intake/Output from previous day: No intake/output data recorded. Intake/Output this shift: No intake/output data recorded.  General appearance: alert, cooperative and no distress Resp: normal work of breathing GI: soft, non-tender  Lab Results:  Recent Labs    09/08/20 0424 09/09/20 0553  WBC 4.8 6.1  HGB 14.8 15.1*  HCT 46.2* 47.4*  PLT 173 177   BMET Recent Labs    09/08/20 0424 09/09/20 0553  NA 138 139  K 4.2 3.2*  CL 101 98  CO2 24 28  GLUCOSE 72 85  BUN 13 14  CREATININE 0.86 0.97  CALCIUM 8.2* 8.7*   PT/INR Recent Labs    09/08/20 0424 09/09/20 0553  LABPROT 31.2* 29.3*  INR 3.1* 2.9*    Studies/Results: CT ABDOMEN PELVIS W CONTRAST  Result Date: 09/07/2020 CLINICAL DATA:  Abdominal pain, lower back pain for 3 months. EXAM: CT ABDOMEN AND PELVIS WITH CONTRAST TECHNIQUE: Multidetector CT imaging of the abdomen and pelvis was performed using the standard protocol following bolus administration of intravenous contrast. CONTRAST:  46mL OMNIPAQUE IOHEXOL 300 MG/ML  SOLN COMPARISON:  CT abdomen pelvis dated 07/10/2019 and other prior exams. FINDINGS: Lower chest: There are small bilateral  pleural effusions with associated atelectasis. Hepatobiliary: Two hepatic cysts are redemonstrated. There is a gallbladder wall thickening with surrounding edema. No definite gallstones are identified. No biliary dilatation. Pancreas: A 6 mm low attenuating mass in the pancreatic tail is not significantly changed since at least 11/28/2012 and presumed benign. No pancreatic ductal dilatation or surrounding inflammatory changes. Spleen: Normal in size without focal abnormality. Adrenals/Urinary Tract: A 1.5 cm lipid rich adrenal adenoma in the right is unchanged and benign. The left adrenal gland appears normal. Kidneys are normal, without renal calculi, focal lesion, or hydronephrosis. Bladder is unremarkable. Stomach/Bowel: Stomach is within normal limits. Appendix appears normal. There is colonic diverticulosis without evidence of diverticulitis. No evidence of bowel wall thickening, distention, or inflammatory changes. Vascular/Lymphatic: Aortic atherosclerosis. No enlarged abdominal or pelvic lymph nodes. Reproductive: Status post hysterectomy. No adnexal masses. Other: There is moderate volume free intraperitoneal fluid. No abdominal wall hernia is identified. Musculoskeletal: Degenerative changes are seen in the spine. IMPRESSION: 1. Gallbladder wall thickening with surrounding edema may represent acute cholecystitis. Right upper quadrant ultrasound could be performed if the clinically indicated. 2. Moderate volume free intraperitoneal fluid. 3. Small bilateral pleural effusions with associated atelectasis. Aortic Atherosclerosis (ICD10-I70.0). Electronically Signed   By: Zerita Boers M.D.   On: 09/07/2020 10:37   US Abdomen Limited RUQ (LIVER/GB)  Result Date: 09/08/2020 CLINICAL DATA:  Right upper quadrant pain. EXAM: ULTRASOUND ABDOMEN LIMITED RIGHT UPPER QUADRANT COMPARISON:  CT abdomen pelvis 09/07/2020 FINDINGS: Gallbladder: Gallbladder wall thickness upper limits of normal. Trace pericholecystic  fluid. Negative sonographic Murphy sign.  Possible 4 mm gallbladder polyp. Common bile duct: Diameter: 5.7 mm. Liver: No focal lesion identified. Within normal limits in parenchymal echogenicity. Questioned mass adjacent to the liver likely represents small adenoma demonstrated on prior CT. Portal vein is patent on color Doppler imaging with normal direction of blood flow towards the liver. Other: Small right pleural effusion. Enlarged IVC and hepatic veins. IMPRESSION: 1. No cholelithiasis. Gallbladder wall thickness upper limits of normal with trace pericholecystic fluid. Negative sonographic Murphy sign. 2. Enlarged IVC and hepatic veins suggesting some component of right heart failure. Electronically Signed   By: Lovey Newcomer M.D.   On: 09/08/2020 10:16    Anti-infectives: Anti-infectives (From admission, onward)   Start     Dose/Rate Route Frequency Ordered Stop   09/07/20 1900  piperacillin-tazobactam (ZOSYN) IVPB 3.375 g  Status:  Discontinued        3.375 g 12.5 mL/hr over 240 Minutes Intravenous Every 8 hours 09/07/20 1630 09/08/20 1155   09/07/20 1100  piperacillin-tazobactam (ZOSYN) IVPB 3.375 g        3.375 g 100 mL/hr over 30 Minutes Intravenous  Once 09/07/20 1056 09/07/20 1210      Assessment/Plan: Patient is a 25 female with history of atrial fibrillation on home coumadin, with potential for acholic cholecystitis per abdominal CT and RUQ [place], and potential for Heart failure per same ultrasound; notably conveying migratory symptoms. She remains afebrile with normal WBC count; and LFTs essentially WNL (bilirbin marginally elevated at 1.3). Endorses some diarrhea today likely attributable to liquid diet and lasix; pain well controlled.   -Neuro: PRN analgesic -GI: advance to heart healthy diet. Zofran PRN for nausea.  -Cardiac: Echo results pending    LOS: 2 days    Janne Lab 09/09/2020

## 2020-09-10 DIAGNOSIS — I4891 Unspecified atrial fibrillation: Secondary | ICD-10-CM

## 2020-09-10 DIAGNOSIS — R1011 Right upper quadrant pain: Secondary | ICD-10-CM

## 2020-09-10 DIAGNOSIS — E782 Mixed hyperlipidemia: Secondary | ICD-10-CM

## 2020-09-10 DIAGNOSIS — Z7901 Long term (current) use of anticoagulants: Secondary | ICD-10-CM

## 2020-09-10 DIAGNOSIS — I10 Essential (primary) hypertension: Secondary | ICD-10-CM

## 2020-09-10 DIAGNOSIS — I5031 Acute diastolic (congestive) heart failure: Secondary | ICD-10-CM

## 2020-09-10 DIAGNOSIS — R1314 Dysphagia, pharyngoesophageal phase: Secondary | ICD-10-CM

## 2020-09-10 LAB — BASIC METABOLIC PANEL
Anion gap: 15 (ref 5–15)
BUN: 13 mg/dL (ref 8–23)
CO2: 29 mmol/L (ref 22–32)
Calcium: 8.7 mg/dL — ABNORMAL LOW (ref 8.9–10.3)
Chloride: 95 mmol/L — ABNORMAL LOW (ref 98–111)
Creatinine, Ser: 0.98 mg/dL (ref 0.44–1.00)
GFR, Estimated: 58 mL/min — ABNORMAL LOW (ref >60)
Glucose, Bld: 112 mg/dL — ABNORMAL HIGH (ref 70–99)
Potassium: 3.1 mmol/L — ABNORMAL LOW (ref 3.5–5.1)
Sodium: 139 mmol/L (ref 135–145)

## 2020-09-10 LAB — URINALYSIS, COMPLETE (UACMP) WITH MICROSCOPIC
Bacteria, UA: NONE SEEN
Bilirubin Urine: NEGATIVE
Glucose, UA: NEGATIVE mg/dL
Hgb urine dipstick: NEGATIVE
Ketones, ur: NEGATIVE mg/dL
Leukocytes,Ua: NEGATIVE
Nitrite: NEGATIVE
Protein, ur: NEGATIVE mg/dL
Specific Gravity, Urine: 1.004 — ABNORMAL LOW (ref 1.005–1.030)
pH: 6 (ref 5.0–8.0)

## 2020-09-10 LAB — CBC
HCT: 48.6 % — ABNORMAL HIGH (ref 36.0–46.0)
Hemoglobin: 15.8 g/dL — ABNORMAL HIGH (ref 12.0–15.0)
MCH: 30.8 pg (ref 26.0–34.0)
MCHC: 32.5 g/dL (ref 30.0–36.0)
MCV: 94.7 fL (ref 80.0–100.0)
Platelets: 184 K/uL (ref 150–400)
RBC: 5.13 MIL/uL — ABNORMAL HIGH (ref 3.87–5.11)
RDW: 18.7 % — ABNORMAL HIGH (ref 11.5–15.5)
WBC: 5.8 K/uL (ref 4.0–10.5)
nRBC: 0 % (ref 0.0–0.2)

## 2020-09-10 LAB — PROTIME-INR
INR: 2.5 — ABNORMAL HIGH (ref 0.8–1.2)
Prothrombin Time: 26.4 s — ABNORMAL HIGH (ref 11.4–15.2)

## 2020-09-10 LAB — BRAIN NATRIURETIC PEPTIDE: B Natriuretic Peptide: 546 pg/mL — ABNORMAL HIGH (ref 0.0–100.0)

## 2020-09-10 MED ORDER — METOPROLOL TARTRATE 5 MG/5ML IV SOLN
5.0000 mg | INTRAVENOUS | Status: DC | PRN
  Administered 2020-09-10: 5 mg via INTRAVENOUS
  Filled 2020-09-10: qty 5

## 2020-09-10 MED ORDER — FUROSEMIDE 10 MG/ML IJ SOLN
40.0000 mg | Freq: Every day | INTRAMUSCULAR | Status: DC
  Administered 2020-09-10 – 2020-09-11 (×2): 40 mg via INTRAVENOUS
  Filled 2020-09-10 (×3): qty 4

## 2020-09-10 MED ORDER — POTASSIUM CHLORIDE 20 MEQ/15ML (10%) PO SOLN
40.0000 meq | Freq: Every day | ORAL | Status: DC
  Administered 2020-09-10 – 2020-09-12 (×3): 40 meq via ORAL
  Filled 2020-09-10 (×3): qty 30

## 2020-09-10 NOTE — Progress Notes (Signed)
ANTICOAGULATION CONSULT NOTE -  Pharmacy Consult for heparin gtt  Indication: atrial fibrillation  Allergies  Allergen Reactions  . Codeine Nausea And Vomiting  . Doxycycline Other (See Comments)    Chest congestion  . Triamterene-Hctz Other (See Comments)    weakness  . Atorvastatin Other (See Comments)    Myalgias   . Crestor [Rosuvastatin Calcium] Other (See Comments)    weakness  . Morphine Nausea Only  . Risedronate Sodium Other (See Comments)    ACTONEL - reflux    Patient Measurements: Height: 5\' 1"  (154.9 cm) Weight: 54.4 kg (120 lb) IBW/kg (Calculated) : 47.8 HEPARIN DW (KG): 54.4   Vital Signs: Temp: 98.3 F (36.8 C) (11/02 2147) Temp Source: Oral (11/02 2147) BP: 134/105 (11/02 2147) Pulse Rate: 104 (11/02 2147)  Labs: Recent Labs    09/08/20 0424 09/08/20 0424 09/09/20 0553 09/10/20 0442  HGB 14.8   < > 15.1* 15.8*  HCT 46.2*  --  47.4* 48.6*  PLT 173  --  177 184  LABPROT 31.2*  --  29.3* 26.4*  INR 3.1*  --  2.9* 2.5*  CREATININE 0.86  --  0.97 0.98   < > = values in this interval not displayed.    Estimated Creatinine Clearance: 34 mL/min (by C-G formula based on SCr of 0.98 mg/dL).   Medical History: Past Medical History:  Diagnosis Date  . 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 Anxiety   . Arthritis    "right leg" (04/11/2015)  . BPPV (benign paroxysmal positional vertigo) 03/23/2016  . Cataract   . Coronary atherosclerosis of native coronary artery    a. Nonobstructive minimal CAD 10/2005.  Marland Kitchen Depression   . Diastolic dysfunction    Grade 1. Ejection fraction 60-65%.  Marland Kitchen Dysrhythmia    a fib  . Erosive esophagitis   . Essential hypertension   . GERD (gastroesophageal reflux disease)   . Grade III hemorrhoids   . History of hiatal hernia   . Hypercholesterolemia   . Internal hemorrhoids with complication 9/73/5329   OCT 2015 FLEX SIG/IH BANDING    . Migraine    "used to have them right bad; I don't now" (04/11/2015)   . MITRAL REGURGITATION 04/27/2010   Qualifier: Diagnosis of  By: [name], MD, [name]   . Osteoporosis   . Paroxysmal atrial fibrillation (HCC)   . PONV (postoperative nausea and vomiting)   . Vertigo     Medications:  Medications Prior to Admission  Medication Sig Dispense Refill Last Dose  . acetaminophen (TYLENOL) 500 MG tablet Take 1,000 mg by mouth 2 (two) times daily.    Past Week at Unknown time  . amiodarone (PACERONE) 200 MG tablet Take 1 tablet (200 mg total) by mouth daily. 90 tablet 1 09/06/2020 at Unknown time  . amLODipine (NORVASC) 10 MG tablet TAKE 1 TABLET EVERY DAY (Patient taking differently: Take 10 mg by mouth daily. ) 90 tablet 0 09/06/2020 at Unknown time  . cholecalciferol (VITAMIN D3) 25 MCG (1000 UT) tablet Take 1,000 Units by mouth daily.   09/06/2020 at Unknown time  . furosemide (LASIX) 40 MG tablet Take 1 tablet (40 mg total) by mouth daily. 90 tablet 2 09/06/2020 at Unknown time  . lisinopril (ZESTRIL) 10 MG tablet Take 1 tablet (10 mg total) by mouth daily. 90 tablet 0 09/06/2020 at Unknown time  . loratadine (CLARITIN) 10 MG tablet Take 10 mg by mouth daily as needed for allergies.  unk  . metoprolol tartrate (LOPRESSOR) 50 MG tablet Take 1 tablet (50 mg total) by mouth 2 (two) times daily. 180 tablet 3 09/06/2020 at 1800  . Multiple Vitamin (MULTIVITAMIN) capsule Take 1 capsule by mouth every evening.   09/06/2020 at Unknown time  . Omega-3 Fatty Acids (FISH OIL) 1000 MG CAPS Take 1,000 mg by mouth 2 (two) times daily.    09/06/2020 at Unknown time  . omeprazole (PRILOSEC) 20 MG capsule Take 1 capsule (20 mg total) by mouth daily. 90 capsule 3 09/07/2020 at Unknown time  . potassium chloride (KLOR-CON) 10 MEQ tablet Take 3 tablets (30 mEq total) by mouth daily with breakfast. 60 tablet 3 09/06/2020 at Unknown time  . simvastatin (ZOCOR) 20 MG tablet Take 1 tablet (20 mg total) by mouth daily. 90 tablet 1 09/06/2020 at Unknown time  . warfarin  (COUMADIN) 3 MG tablet TAKE 1/2 TO 1 TABLET EVERY DAY AS DIRECTED BY  ANTICOAGULATION CLINIC (Patient taking differently: Take 1.5-3 mg by mouth daily. ) 90 tablet 0 09/06/2020 at 1800   Scheduled:  . amiodarone  200 mg Oral Daily  . amLODipine  10 mg Oral Daily  . feeding supplement  1 Container Oral BID BM  . furosemide  40 mg Oral Daily  . metoprolol tartrate  50 mg Oral BID  . multivitamin with minerals  1 tablet Oral Daily  . pantoprazole  40 mg Oral Daily  . potassium chloride  40 mEq Oral Once  . saccharomyces boulardii  250 mg Oral BID   Infusions:  . sodium chloride 500 mL (09/07/20 1833)   PRN: sodium chloride, acetaminophen **OR** acetaminophen, fentaNYL (SUBLIMAZE) injection, ondansetron **OR** ondansetron (ZOFRAN) IV Anti-infectives (From admission, onward)   Start     Dose/Rate Route Frequency Ordered Stop   09/07/20 1900  piperacillin-tazobactam (ZOSYN) IVPB 3.375 g  Status:  Discontinued        3.375 g 12.5 mL/hr over 240 Minutes Intravenous Every 8 hours 09/07/20 1630 09/08/20 1155   09/07/20 1100  piperacillin-tazobactam (ZOSYN) IVPB 3.375 g        3.375 g 100 mL/hr over 30 Minutes Intravenous  Once 09/07/20 1056 09/07/20 1210      Assessment: Norma Barajas a 79 y.o. female requires anticoagulation with a heparin iv infusion for the indication of  atrial fibrillation. Heparin gtt will be started following pharmacy protocol per pharmacy consult. Patient is on warfarin prior to admission - admit INR 2.9 therapeutic  Discussed with Dr Roderic Palau he would like to start heparin once INR < 2.0 INR 2.5 today.  Goal of Therapy:  Heparin level 0.3-0.7 units/ml Monitor platelets by anticoagulation protocol: Yes   Plan:  Start heparin gtt once INR <2.0 INR & CBC daily ordered Monitor for signs and symptoms of bleeding   Norma Barajas, PharmD Clinical Pharmacist 09/10/2020 8:15 AM

## 2020-09-10 NOTE — Consult Note (Signed)
Referring Provider: Orson Eva, DO  Primary Care Physician:  Loman Brooklyn, FNP Primary Gastroenterologist:  Dr. Laural Golden  Reason for Consultation:    Dysphagia and abdominal pain.  HPI:   Patient is 79 year old Caucasian female who has chronic atrial fibrillation on anticoagulant history of hypertension who presented to emergency room 4 days ago with bilateral flank pain as well as pain across her upper abdomen more in the right upper quadrant. Her transaminases were normal. Abdominal pelvic CT was obtained and which revealed cardiomegaly small pleural effusion hepatic congestion ascites and expanded IVC. Gallbladder walls was thick. There was concern for acute cholecystitis. Patient had ultrasound on 09/08/2020. The study was negative for cholelithiasis but revealed gallbladder wall thickening at upper limit of normal with trace pericholecystic fluid. Once again she was noted to have enlarged IVC and hepatic veins suggesting competent of right heart failure. He was also seen by surgical service and was concluded that she did not have acute cholecystitis. Patient says she has been having pain off and on for the last 2 months. Pain usually starts in right upper quadrant and radiates medially. She has noted some nausea but no vomiting fever or chills. She denies dyspnea at rest. She does get short of breath if she walks more than few yards at a time. No history of diarrhea melena or rectal bleeding. She does not have a good appetite. She believes she has lost 10 pounds over the last few months. Patient is also complaining of dysphagia with pills and solids. She says the symptoms started about a month ago. Water comes back. Food bolus generally goes down. She feels heartburn is well controlled with therapy. She had her esophagus dilated by Dr. 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 Earlier today patient was evaluated by Ms. Genene Churn of speech therapy and no significant  abnormality was noted. She recommended GI consultation. Patient also had echocardiogram yesterday results of which are reviewed below. She has markedly dilated left atrium, right atrium as well as right ventricle. Inferior vena cava dilated with less than 50% respiratory variability suggesting elevated right atrial pressure. The study also reveals TR mild to moderate MR and mild aortic regurgitation.  Past Medical History:  Diagnosis Date  . 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 Anxiety   . Arthritis    "right leg" (04/11/2015)  . BPPV (benign paroxysmal positional vertigo) 03/23/2016  . Cataract   . Coronary atherosclerosis of native coronary artery    a. Nonobstructive minimal CAD 10/2005.  Marland Kitchen Depression   . Diastolic dysfunction    Grade 1. Ejection fraction 60-65%.  Marland Kitchen Dysrhythmia    a fib  . Erosive esophagitis   . Essential hypertension   . GERD (gastroesophageal reflux disease)   . Grade III hemorrhoids   . History of hiatal hernia   . Hypercholesterolemia   . Internal hemorrhoids with complication 1/93/7902   OCT 2015 FLEX SIG/IH BANDING    . Migraine    "used to have them right bad; I don't now" (04/11/2015)  . MITRAL REGURGITATION 04/27/2010   Qualifier: Diagnosis of  By: [name], MD, [name]   . Osteoporosis   . Paroxysmal atrial fibrillation (HCC)   . PONV (postoperative nausea and vomiting)   . Vertigo     Past Surgical History:  Procedure Laterality Date  . BRAVO [place] STUDY  11/15/2012   Procedure: BRAVO [place];  Surgeon: [name], MD;  Location: [facility];  Service: Endoscopy;;  . CARDIOVERSION N/A 05/16/2020   Procedure: CARDIOVERSION;  Surgeon: Donato Heinz, MD;  Location: [facility]COPY;  Service: Cardiovascular;  Laterality: N/A;  . CATARACT EXTRACTION W/ INTRAOCULAR LENS  IMPLANT, BILATERAL Bilateral   . COLONOSCOPY  2008   Dr. [name]: internal hemorrhoids   . DILATION AND CURETTAGE OF UTERUS    .  ESOPHAGOGASTRODUODENOSCOPY (EGD) WITH ESOPHAGEAL DILATION  2001   Dr. [name]: erosive esophagitis, esophageal stricture, duodenitis, s/p Savary dilation  . ESOPHAGOGASTRODUODENOSCOPY (EGD) WITH ESOPHAGEAL DILATION  11/15/2012   WPY:[id number] web was found & MOST LIKELY CAUSE FOR DYAPHAGIA/Polyp was found in the gastric body and gastric fundus/ gastritis on bx  . EYE SURGERY Bilateral    "laser OR after cataract OR; cause I couldn't see"  . FLEXIBLE SIGMOIDOSCOPY N/A 08/22/2014   mild diverticulosis in sigmoid, moderate sized Grade 3 hemorrhoids s/p banding X 3.   Marland Kitchen FRACTURE SURGERY Right    below the knee - 2 bones broke has plates  . HEMORRHOID BANDING N/A 08/22/2014   Procedure: HEMORRHOID BANDING;  Surgeon: [name], MD;  Location: [facility];  Service: Endoscopy;  Laterality: N/A;  . HEMORRHOID SURGERY N/A 03/30/2018   Procedure: EXTENSIVE HEMORRHOIDECTOMY;  Surgeon: [name], MD;  Location: [facility] ORS;  Service: General;  Laterality: N/A;  . OPEN REDUCTION INTERNAL FIXATION (ORIF) TIBIA/FIBULA FRACTURE Right 2013   broke tibia and fibula after falling down stairs  . PROLAPSED UTERINE FIBROID LIGATION  2015  . TOTAL ABDOMINAL HYSTERECTOMY      Prior to Admission medications   Medication Sig Start Date End Date Taking? Authorizing Provider  acetaminophen (TYLENOL) 500 MG tablet Take 1,000 mg by mouth 2 (two) times daily.    Yes [provider]  amiodarone (PACERONE) 200 MG tablet Take 1 tablet (200 mg total) by mouth daily. 06/13/20  Yes Sherran Needs, NP  amLODipine (NORVASC) 10 MG tablet TAKE 1 TABLET EVERY DAY Patient taking differently: Take 10 mg by mouth daily.  09/04/20  Yes [name] F, FNP  cholecalciferol (VITAMIN D3) 25 MCG (1000 UT) tablet Take 1,000 Units by mouth daily.   Yes [provider]  furosemide (LASIX) 40 MG tablet Take 1 tablet (40 mg total) by mouth daily. 06/13/20  Yes Sherran Needs, NP  lisinopril (ZESTRIL) 10 MG tablet  Take 1 tablet (10 mg total) by mouth daily. 08/27/20  Yes Gwenlyn Perking, FNP  loratadine (CLARITIN) 10 MG tablet Take 10 mg by mouth daily as needed for allergies.   Yes [provider]  metoprolol tartrate (LOPRESSOR) 50 MG tablet Take 1 tablet (50 mg total) by mouth 2 (two) times daily. 07/16/20  Yes Minus Breeding, MD  Multiple Vitamin (MULTIVITAMIN) capsule Take 1 capsule by mouth every evening.   Yes [provider]  Omega-3 Fatty Acids (FISH OIL) 1000 MG CAPS Take 1,000 mg by mouth 2 (two) times daily.    Yes [provider]  omeprazole (PRILOSEC) 20 MG capsule Take 1 capsule (20 mg total) by mouth daily. 12/11/19  Yes Loman Brooklyn, FNP  potassium chloride (KLOR-CON) 10 MEQ tablet Take 3 tablets (30 mEq total) by mouth daily with breakfast. 09/04/20  Yes Gwenlyn Perking, FNP  simvastatin (ZOCOR) 20 MG tablet Take 1 tablet (20 mg total) by mouth daily. 06/13/20  Yes Sherran Needs, NP  warfarin (COUMADIN) 3 MG tablet TAKE 1/2 TO 1 TABLET EVERY DAY AS DIRECTED BY  ANTICOAGULATION  CLINIC Patient taking differently: Take 1.5-3 mg by mouth daily.  07/07/20  Yes Loman Brooklyn, FNP    Current Facility-Administered Medications  Medication Dose Route Frequency Provider Last Rate Last Admin  . 0.9 %  sodium chloride infusion   Intravenous PRN Kathie Dike, MD 10 mL/hr at 09/07/20 1833 500 mL at 09/07/20 1833  . acetaminophen (TYLENOL) tablet 650 mg  650 mg Oral Q6H PRN Kathie Dike, MD   650 mg at 09/10/20 0834   Or  . acetaminophen (TYLENOL) suppository 650 mg  650 mg Rectal Q6H PRN Kathie Dike, MD      . amiodarone (PACERONE) tablet 200 mg  200 mg Oral Daily Kathie Dike, MD   200 mg at 09/10/20 0818  . amLODipine (NORVASC) tablet 10 mg  10 mg Oral Daily Kathie Dike, MD   10 mg at 09/10/20 0818  . feeding supplement (BOOST / RESOURCE BREEZE) liquid 1 Container  1 Container Oral BID BM Kathie Dike, MD   1 Container at 09/09/20 1352  .  fentaNYL (SUBLIMAZE) injection 50 mcg  50 mcg Intravenous Q4H PRN Kathie Dike, MD   50 mcg at 09/09/20 0545  . furosemide (LASIX) injection 40 mg  40 mg Intravenous Daily Tat, Shanon Brow, MD   40 mg at 09/10/20 1030  . metoprolol tartrate (LOPRESSOR) tablet 50 mg  50 mg Oral BID Kathie Dike, MD   50 mg at 09/10/20 0818  . multivitamin with minerals tablet 1 tablet  1 tablet Oral Daily Kathie Dike, MD   1 tablet at 09/10/20 0818  . ondansetron (ZOFRAN) tablet 4 mg  4 mg Oral Q6H PRN Kathie Dike, MD       Or  . ondansetron (ZOFRAN) injection 4 mg  4 mg Intravenous Q6H PRN Kathie Dike, MD      . pantoprazole (PROTONIX) EC tablet 40 mg  40 mg Oral Daily Kathie Dike, MD   40 mg at 09/10/20 0818  . potassium chloride 20 MEQ/15ML (10%) solution 40 mEq  40 mEq Oral Daily Tat, David, MD   40 mEq at 09/10/20 1030  . saccharomyces boulardii (FLORASTOR) capsule 250 mg  250 mg Oral BID Lang Snow, FNP   250 mg at 09/10/20 0818    Allergies as of 09/07/2020 - Review Complete 09/07/2020  Allergen Reaction Noted  . Codeine Nausea And Vomiting 03/24/2018  . Doxycycline Other (See Comments) 04/16/2020  . Triamterene-hctz Other (See Comments) 02/18/2011  . Atorvastatin Other (See Comments) 04/04/2015  . Crestor [rosuvastatin calcium] Other (See Comments) 02/18/2011  . Morphine Nausea Only   . Risedronate sodium Other (See Comments) 02/18/2011    Family History  Problem Relation Age of Onset  . Colon cancer Mother 46  . Heart disease Mother   . Osteoporosis Mother   . Hip fracture Mother   . Stroke Sister   . Diabetes Sister   . Osteoporosis Sister   . Arthritis Sister   . Uterine cancer Sister   . Stroke Sister   . Heart disease Father   . Hyperlipidemia Brother   . Hypertension Brother   . Heart disease Brother   . Stroke Brother   . Heart disease Sister   . Dementia Sister   . Diabetes Son   . Stroke Son   . Heart attack Neg Hx     Social History   Socioeconomic  History  . Marital status: Married    Spouse name: george   . Number of children: 1  . Years of  education: Not on file  . Highest education level: Not on file  Occupational History  . Occupation: Retired    Comment: Textile  Tobacco Use  . Smoking status: Never Smoker  . Smokeless tobacco: Never Used  Vaping Use  . Vaping Use: Never used  Substance and Sexual Activity  . Alcohol use: No    Alcohol/week: 0.0 standard drinks  . Drug use: No  . Sexual activity: Not Currently    Birth control/protection: Surgical, Post-menopausal    Comment: hyst  Other Topics Concern  . Not on file  Social History Narrative   Married   No regular exercise   Social Determinants of Health   Financial Resource Strain:   . Difficulty of Paying Living Expenses: Not on file  Food Insecurity:   . Worried About Charity fundraiser in the Last Year: Not on file  . Ran Out of Food in the Last Year: Not on file  Transportation Needs:   . Lack of Transportation (Medical): Not on file  . Lack of Transportation (Non-Medical): Not on file  Physical Activity:   . Days of Exercise per Week: Not on file  . Minutes of Exercise per Session: Not on file  Stress:   . Feeling of Stress : Not on file  Social Connections:   . Frequency of Communication with Friends and Family: Not on file  . Frequency of Social Gatherings with Friends and Family: Not on file  . Attends Religious Services: Not on file  . Active Member of Clubs or Organizations: Not on file  . Attends Archivist Meetings: Not on file  . Marital Status: Not on file  Intimate Partner Violence:   . Fear of Current or Ex-Partner: Not on file  . Emotionally Abused: Not on file  . Physically Abused: Not on file  . Sexually Abused: Not on file    Review of Systems: See HPI, otherwise normal ROS  Physical Exam: Temp:  [98.3 F (36.8 C)-98.6 F (37 C)] 98.6 F (37 C) (11/03 0830) Pulse Rate:  [104-107] 107 (11/03 0830) Resp:   [17-18] 17 (11/03 0830) BP: (134-139)/(105) 139/105 (11/03 0830) SpO2:  [92 %-96 %] 96 % (11/03 0830) Weight:  [52 kg] 52 kg (11/03 1105) Last BM Date: 09/06/20  Patient is alert and in no acute distress. Conjunctiva is pink. Sclerae nonicteric. Oropharyngeal mucosa is normal. She has complete upper and partial lower denture plate in place. JVD is markedly elevated. No adenopathy or thyromegaly. Cardiac exam with irregular rhythm normal S1 and S2. Faint systolic murmur noted at aortic area. Auscultation lungs reveal vesicular breath sounds bilaterally. Abdomen is symmetrical. Bowel sounds are normal. On palpation abdomen is soft. Liver edge is easily palpable. Liver is pulsatile. Spleen is not palpable. She has trace edema involving right ankle.   Intake/Output from previous day: 11/02 0701 - 11/03 0700 In: 720 [P.O.:720] Out: -  Intake/Output this shift: Total I/O In: 760 [P.O.:760] Out: 850 [Urine:850]  Lab Results: Recent Labs    09/08/20 0424 09/09/20 0553 09/10/20 0442  WBC 4.8 6.1 5.8  HGB 14.8 15.1* 15.8*  HCT 46.2* 47.4* 48.6*  PLT 173 177 184   BMET Recent Labs    09/08/20 0424 09/09/20 0553 09/10/20 0442  NA 138 139 139  K 4.2 3.2* 3.1*  CL 101 98 95*  CO2 24 28 29   GLUCOSE 72 85 112*  BUN 13 14 13   CREATININE 0.86 0.97 0.98  CALCIUM 8.2* 8.7* 8.7*  LFT Recent Labs    09/09/20 0553  PROT 6.2*  ALBUMIN 3.4*  AST 28  ALT 18  ALKPHOS 56  BILITOT 1.3*   PT/INR Recent Labs    09/09/20 0553 09/10/20 0442  LABPROT 29.3* 26.4*  INR 2.9* 2.5*   Hepatitis Panel No results for input(s): HEPBSAG, HCVAB, HEPAIGM, HEPBIGM in the last 72 hours.  Studies/Results: ECHOCARDIOGRAM COMPLETE  Result Date: 09/09/2020    ECHOCARDIOGRAM REPORT   Patient Name:   MACARIA BIAS Date of Exam: 09/09/2020 Medical Rec #:  [id number]   Height:       61.0 in Accession #:    [id number]  Weight:       120.0 lb Date of Birth:  03-12-1941   BSA:          1.520 m Patient  Age:    7 years    BP:           145/112 mmHg Patient Gender: F           HR:           100 bpm. Exam Location:  [facility] Procedure: 2D Echo, Cardiac Doppler and Color Doppler Indications:    Congestive Heart Failure 428.0 / I50.9  History:        Patient has prior history of Echocardiogram examinations, most                 recent 04/11/2015. Arrythmias:Atrial Fibrillation,                 Signs/Symptoms:Murmur; Risk Factors:Hypertension and                 Dyslipidemia. Chronic anticoagulation.  Sonographer:    Alvino Chapel RCS Referring Phys: [place]  1. Left ventricular ejection fraction, by estimation, is 50 to 55%. The left ventricle has low normal function. The left ventricle has no regional wall motion abnormalities. Left ventricular diastolic parameters are indeterminate.  2. Ventricular septum is flattened in systole and diastole suggesting RV pressure and volume overload. . Right ventricular systolic function is moderately reduced. The right ventricular size is moderately enlarged.  3. Left atrial size was severely dilated.  4. Right atrial size was severely dilated.  5. The mitral valve is normal in structure. Mild to moderate mitral valve regurgitation. No evidence of mitral stenosis.  6. The tricuspid valve is abnormal. Tricuspid valve regurgitation is severe.  7. The aortic valve is tricuspid. Aortic valve regurgitation is mild.  8. The inferior vena cava is dilated in size with <50% respiratory variability, suggesting right atrial pressure of 15 mmHg. FINDINGS  Left Ventricle: Left ventricular ejection fraction, by estimation, is 50 to 55%. The left ventricle has low normal function. The left ventricle has no regional wall motion abnormalities. The left ventricular internal cavity size was normal in size. There is no left ventricular hypertrophy. Left ventricular diastolic parameters are indeterminate. Right Ventricle: Ventricular septum is flattened in systole and diastole  suggesting RV pressure and volume overload. The right ventricular size is moderately enlarged. Right vetricular wall thickness was not assessed. Right ventricular systolic function is moderately reduced. Left Atrium: Left atrial size was severely dilated. Right Atrium: Right atrial size was severely dilated. Pericardium: There is no evidence of pericardial effusion. Mitral Valve: The mitral valve is normal in structure. Mild to moderate mitral valve regurgitation. No evidence of mitral valve stenosis. Tricuspid Valve: The tricuspid valve is abnormal. Tricuspid valve regurgitation is severe. No evidence of tricuspid  stenosis. Aortic Valve: The aortic valve is tricuspid. Aortic valve regurgitation is mild. Aortic regurgitation PHT measures 450 msec. Aortic valve mean gradient measures 2.6 mmHg. Aortic valve peak gradient measures 4.8 mmHg. Aortic valve area, by VTI measures 1.38 cm. Pulmonic Valve: The pulmonic valve was not well visualized. Pulmonic valve regurgitation is not visualized. No evidence of pulmonic stenosis. Aorta: The aortic root is normal in size and structure. Pulmonary Artery: Mild pulmonary HTN, PASP is 35 mmHg. Venous: The inferior vena cava is dilated in size with less than 50% respiratory variability, suggesting right atrial pressure of 15 mmHg. IAS/Shunts: The interatrial septum was not well visualized.  LEFT VENTRICLE PLAX 2D LVIDd:         3.73 cm LVIDs:         2.80 cm LV PW:         0.81 cm LV IVS:        0.87 cm LVOT diam:     1.60 cm LV SV:         25 LV SV Index:   16 LVOT Area:     2.01 cm  LV Volumes (MOD) LV vol d, MOD A2C: 44.7 ml LV vol d, MOD A4C: 39.9 ml LV vol s, MOD A2C: 26.7 ml LV vol s, MOD A4C: 18.1 ml LV SV MOD A2C:     18.0 ml LV SV MOD A4C:     39.9 ml LV SV MOD BP:      19.1 ml RIGHT VENTRICLE RV S prime:     8.00 cm/s TAPSE (M-mode): 1.4 cm LEFT ATRIUM             Index       RIGHT ATRIUM           Index LA diam:        3.80 cm 2.50 cm/m  RA Area:     23.40 cm LA Vol  (A2C):   48.7 ml 32.04 ml/m RA Volume:   74.60 ml  49.07 ml/m LA Vol (A4C):   70.8 ml 46.57 ml/m LA Biplane Vol: 59.9 ml 39.40 ml/m  AORTIC VALVE AV Area (Vmax):    1.40 cm AV Area (Vmean):   1.32 cm AV Area (VTI):     1.38 cm AV Vmax:           109.68 cm/s AV Vmean:          76.432 cm/s AV VTI:            0.178 m AV Peak Grad:      4.8 mmHg AV Mean Grad:      2.6 mmHg LVOT Vmax:         76.30 cm/s LVOT Vmean:        50.350 cm/s LVOT VTI:          0.122 m LVOT/AV VTI ratio: 0.69 AI PHT:            450 msec  AORTA Ao Root diam: 3.10 cm MITRAL VALVE                 TRICUSPID VALVE MV Area (PHT): 5.16 cm      TR Peak grad:   19.9 mmHg MV Decel Time: 147 msec      TR Vmax:        223.00 cm/s MR Peak grad:    117.1 mmHg MR Mean grad:    72.0 mmHg   SHUNTS MR Vmax:  541.00 cm/s Systemic VTI:  0.12 m MR Vmean:        392.0 cm/s  Systemic Diam: 1.60 cm MR PISA:         0.25 cm MR PISA Eff ROA: 1 mm MR PISA Radius:  0.20 cm MV E velocity: 86.30 cm/s Carlyle Dolly MD Electronically signed by Carlyle Dolly MD Signature Date/Time: 09/09/2020/1:13:32 PM    Final    I have reviewed patient's CT and ultrasound from this admission and compared with the CT from September 2020.  Assessment;  Patient is 79 year old Caucasian female with chronic atrial fibrillation on anticoagulant who presented to emergency room on 09/07/2020 for abdominal pain and her work-up noted to have gallbladder wall thickening concerning for cholecystitis. CT also revealed right pleural effusion ascites hepatic congestion and expanded IVC. Dr. Eugenia Mcalpine of surgical service was consulted and ruled out cholecystitis. Patient's abdominal examination is not consistent with diagnosis of acute cholecystitis.   #1 dysphagia. Patient is now also complaining of dysphagia with pills and solids reason for consultation. She does have history of esophageal web which was last dilated in January 2014. She also has chronic GERD and  heartburn is well controlled with therapy. Patient is anticoagulated and she also appears to be in right-sided heart failure and not quite ready for esophagogastroduodenoscopy. She would therefore would be first evaluated with barium pill esophagogram. If she has structural abnormality to her esophagus she will need esophagogastroduodenoscopy with esophageal dilation in which case anticoagulation will have to be interrupted.  #2. Abdominal pain. Abdominal pain appears to be due to hepatic congestion which appears significant. She has pulsatile liver. I wonder if right-sided failure is due to tricuspid regurg. She has minimal ascites which is secondary to right-sided heart failure. She does not have history of chronic liver disease and and I doubt that she has chronic liver disease.  Recommendations;  Proceed with barium pill esophagogram. Cardiology consultation to optimize therapy for right-sided heart failure.   LOS: 3 days   Orlondo Holycross  09/10/2020, 5:15 PM

## 2020-09-10 NOTE — Progress Notes (Addendum)
PROGRESS NOTE  Norma Barajas DGU:[id number] DOB: [birth year] DOA: 09/07/2020 PCP: Loman Brooklyn, FNP  Brief History:  Norma Barajas a 79 y.o.femalewith medical history significant ofhypertension, hyperlipidemia, atrial fibrillation on anticoagulation, presents to the emergency room with complaints of intermittent abdominal pain for the past 2 months. Symptoms have progressively gotten worse, particularly in the last 24 hours. Pain is described in the right upper quadrant, epigastrium radiating to her right upper back. Symptoms are worse with p.o. intake. She has had nausea, but no vomiting. Bowel movements have otherwise been normal. She is not had any fever. She also does describe progressive shortness of breath over the last few months. She reports is easier to breathe when she lays down flat, dyspnea is worse on exertion.  ED Course:She is noted to be tachycardic in atrial fibrillation. Blood pressure is elevated. WBC count is normal. Transaminases also are not elevated, although bilirubin is mildly elevated. Lactic acid also elevated. CT of the abdomen and pelvis indicated possible gallbladder wall thickening and pericholecystic fluid. She has been referred for admission.  Assessment/Plan: Abdominal pain -Initially, concerns for acalculous cholecystitis based on CT findings -Ultrasound does not show any evidence of stones, negative Murphy sign -She is afebrile, has no leukocytosis and LFTs are otherwise unremarkable -She was initially started on IV antibiotics -Seen by general surgery and it is felt that cholecystitis at this point is less likely -Antibiotics have been discontinued -Tolerating liquid diet, advanced to solid food, monitor for tolerance -11/3--states abd pain is not much better, worse with food--discussed with general surgery--Dr. Jones Skene HIDA scan, consult GI -suspect this pain is due to chronic hepatic congestion  Dysphagia -consult  GI-Rehman  Atrial fibrillation, chronic -INR is currently therapeutic -Pharmacy to dose coumadin -Continue metoprolol for rate control as well as amiodarone  Acute diastolic CHF--Cor Pulmonale -remains clinically fluid overloaded -Echo EF 50-55%, no WMA, RV volume overload, mild-mod MR, severe TR -She received a dose of lasix on 11/1 -restart IV lasix -consult cardiology  Generalized weakness -physical therapy evaluation  Hypertension -Blood pressure currently elevated -Continue amlodipine and metoprolol  Hyperlipidemia -Hold statin for now  Hypokalemia -Replace -check mag        Status is: Inpatient  Remains inpatient appropriate because:IV treatments appropriate due to intensity of illness or inability to take PO   Dispo: The patient is from: Home              Anticipated d/c is to: Home              Anticipated d/c date is: 1 day              Patient currently is not medically stable to d/c.        Family Communication:   Spouse updated at bedside 11/3  Consultants:  General surgery; GI  Code Status:  DNR  DVT Prophylaxis:  warfarin   Procedures: As Listed in Progress Note Above  Antibiotics: None      Subjective: Patient complains of bilateral lower back pain.  Denies f/c, cp, n/v/d.  States RUQ abd pain is about the same, worse with meals.  Denies dysuria, hematuria, cough , hemoptysis  Objective: Vitals:   09/09/20 0453 09/09/20 1305 09/09/20 2147 09/10/20 0830  BP: (!) 145/112 (!) 128/103 (!) 134/105 (!) 139/105  Pulse: 100 (!) 110 (!) 104 (!) 107  Resp: 16 18 18 17   Temp: 98 F (36.7 C) 98.2 F (36.8  C) 98.3 F (36.8 C) 98.6 F (37 C)  TempSrc: Oral Oral Oral Oral  SpO2: 96% 95% 92% 96%  Weight:      Height:        Intake/Output Summary (Last 24 hours) at 09/10/2020 0910 Last data filed at 09/09/2020 1300 Gross per 24 hour  Intake 240 ml  Output --  Net 240 ml   Weight change:  Exam:   General:  Pt is  alert, follows commands appropriately, not in acute distress  HEENT: No icterus, No thrush, No neck mass, [place]/AT  Cardiovascular: IRRR, S1/S2, no rubs, no gallops  Respiratory:bibasilar rales. No wheeze  Abdomen: Soft/+BS, non tender, non distended, no guarding  Extremities: trace LE edema, No lymphangitis, No petechiae, No rashes, no synovitis   Data Reviewed: I have personally reviewed following labs and imaging studies Basic Metabolic Panel: Recent Labs  Lab 09/04/20 1000 09/07/20 0914 09/07/20 1038 09/08/20 0424 09/09/20 0553 09/10/20 0442  NA 144 138  --  138 139 139  K 3.5 3.0*  --  4.2 3.2* 3.1*  CL 97 98  --  101 98 95*  CO2 30* 27  --  24 28 29   GLUCOSE 89 162*  --  72 85 112*  BUN 14 16  --  13 14 13   CREATININE 1.15* 1.09*  --  0.86 0.97 0.98  CALCIUM 8.7 8.6*  --  8.2* 8.7* 8.7*  MG  --   --  1.7  --   --   --    Liver Function Tests: Recent Labs  Lab 09/07/20 0914 09/08/20 0424 09/09/20 0553  AST 29 26 28   ALT 19 18 18   ALKPHOS 66 52 56  BILITOT 1.3* 1.0 1.3*  PROT 6.6 5.7* 6.2*  ALBUMIN 3.6 3.1* 3.4*   Recent Labs  Lab 09/07/20 0914  LIPASE 24   No results for input(s): AMMONIA in the last 168 hours. Coagulation Profile: Recent Labs  Lab 09/04/20 1155 09/07/20 0916 09/08/20 0424 09/09/20 0553 09/10/20 0442  INR 3.6* 2.9* 3.1* 2.9* 2.5*   CBC: Recent Labs  Lab 09/07/20 0914 09/08/20 0424 09/09/20 0553 09/10/20 0442  WBC 6.1 4.8 6.1 5.8  NEUTROABS 4.5  --   --   --   HGB 16.1* 14.8 15.1* 15.8*  HCT 50.1* 46.2* 47.4* 48.6*  MCV 95.4 96.3 96.3 94.7  PLT 183 173 177 184   Cardiac Enzymes: No results for input(s): CKTOTAL, CKMB, CKMBINDEX, TROPONINI in the last 168 hours. BNP: Invalid input(s): POCBNP CBG: No results for input(s): GLUCAP in the last 168 hours. HbA1C: No results for input(s): HGBA1C in the last 72 hours. Urine analysis:    Component Value Date/Time   COLORURINE STRAW (A) 07/10/2019 1025   APPEARANCEUR  Clear 07/31/2020 1545   LABSPEC 1.003 (L) 07/10/2019 1025   PHURINE 8.0 07/10/2019 1025   GLUCOSEU Negative 07/31/2020 1545   HGBUR NEGATIVE 07/10/2019 1025   BILIRUBINUR Negative 07/31/2020 1545   KETONESUR NEGATIVE 07/10/2019 1025   PROTEINUR Trace (A) 07/31/2020 1545   PROTEINUR NEGATIVE 07/10/2019 1025   UROBILINOGEN negative 02/06/2015 1252   NITRITE Negative 07/31/2020 1545   NITRITE NEGATIVE 07/10/2019 1025   LEUKOCYTESUR Negative 07/31/2020 1545   LEUKOCYTESUR NEGATIVE 07/10/2019 1025   Sepsis Labs: @LABRCNTIP (procalcitonin:4,lacticidven:4) ) Recent Results (from the past 240 hour(s))  Respiratory Panel by RT PCR (Flu A&B, Covid) - Nasopharyngeal Swab     Status: None   Collection Time: 09/07/20 10:04 AM   Specimen: Nasopharyngeal Swab  Result Value  Ref Range Status   SARS Coronavirus 2 by RT PCR NEGATIVE NEGATIVE Final    Comment: (NOTE) SARS-CoV-2 target nucleic acids are NOT DETECTED.  The SARS-CoV-2 RNA is generally detectable in upper respiratoy specimens during the acute phase of infection. The lowest concentration of SARS-CoV-2 viral copies this assay can detect is 131 copies/mL. A negative result does not preclude SARS-Cov-2 infection and should not be used as the sole basis for treatment or other patient management decisions. A negative result may occur with  improper specimen collection/handling, submission of specimen other than nasopharyngeal swab, presence of viral mutation(s) within the areas targeted by this assay, and inadequate number of viral copies (<131 copies/mL). A negative result must be combined with clinical observations, patient history, and epidemiological information. The expected result is Negative.  Fact Sheet for Patients:  PinkCheek.be  Fact Sheet for Healthcare Providers:  GravelBags.it  This test is no t yet approved or cleared by the [place] FDA and  has been  authorized for detection and/or diagnosis of SARS-CoV-2 by FDA under an Emergency Use Authorization (EUA). This EUA will remain  in effect (meaning this test can be used) for the duration of the COVID-19 declaration under Section 564(b)(1) of the Act, 21 U.S.C. section 360bbb-3(b)(1), unless the authorization is terminated or revoked sooner.     Influenza A by PCR NEGATIVE NEGATIVE Final   Influenza B by PCR NEGATIVE NEGATIVE Final    Comment: (NOTE) The Xpert Xpress SARS-CoV-2/FLU/RSV assay is intended as an aid in  the diagnosis of influenza from Nasopharyngeal swab specimens and  should not be used as a sole basis for treatment. Nasal washings and  aspirates are unacceptable for Xpert Xpress SARS-CoV-2/FLU/RSV  testing.  Fact Sheet for Patients: PinkCheek.be  Fact Sheet for Healthcare Providers: GravelBags.it  This test is not yet approved or cleared by the [place] FDA and  has been authorized for detection and/or diagnosis of SARS-CoV-2 by  FDA under an Emergency Use Authorization (EUA). This EUA will remain  in effect (meaning this test can be used) for the duration of the  Covid-19 declaration under Section 564(b)(1) of the Act, 21  U.S.C. section 360bbb-3(b)(1), unless the authorization is  terminated or revoked. Performed at [facility], [address], [place],  [address]      Scheduled Meds: . amiodarone  200 mg Oral Daily  . amLODipine  10 mg Oral Daily  . feeding supplement  1 Container Oral BID BM  . furosemide  40 mg Oral Daily  . metoprolol tartrate  50 mg Oral BID  . multivitamin with minerals  1 tablet Oral Daily  . pantoprazole  40 mg Oral Daily  . potassium chloride  40 mEq Oral Once  . saccharomyces boulardii  250 mg Oral BID   Continuous Infusions: . sodium chloride 500 mL (09/07/20 1833)    Procedures/Studies: DG Lumbar Spine 2-3 Views  Result Date: 08/18/2020 CLINICAL  DATA:  79 year old female with low back pain. EXAM: LUMBAR SPINE - 2-3 VIEW COMPARISON:  Lumbar spine radiograph dated 07/31/2015. FINDINGS: There is no acute fracture or subluxation of the lumbar spine. There is osteopenia with degenerative changes and lumbar levoscoliosis. Multilevel facet arthropathy. There is atherosclerotic calcification of the aorta. The soft tissues are unremarkable. IMPRESSION: No acute fracture or subluxation of the lumbar spine. Electronically Signed   By: Anner Crete M.D.   On: 08/18/2020 22:16   CT ABDOMEN PELVIS W CONTRAST  Result Date: 09/07/2020 CLINICAL DATA:  Abdominal pain,  lower back pain for 3 months. EXAM: CT ABDOMEN AND PELVIS WITH CONTRAST TECHNIQUE: Multidetector CT imaging of the abdomen and pelvis was performed using the standard protocol following bolus administration of intravenous contrast. CONTRAST:  20mL OMNIPAQUE IOHEXOL 300 MG/ML  SOLN COMPARISON:  CT abdomen pelvis dated 07/10/2019 and other prior exams. FINDINGS: Lower chest: There are small bilateral pleural effusions with associated atelectasis. Hepatobiliary: Two hepatic cysts are redemonstrated. There is a gallbladder wall thickening with surrounding edema. No definite gallstones are identified. No biliary dilatation. Pancreas: A 6 mm low attenuating mass in the pancreatic tail is not significantly changed since at least 11/28/2012 and presumed benign. No pancreatic ductal dilatation or surrounding inflammatory changes. Spleen: Normal in size without focal abnormality. Adrenals/Urinary Tract: A 1.5 cm lipid rich adrenal adenoma in the right is unchanged and benign. The left adrenal gland appears normal. Kidneys are normal, without renal calculi, focal lesion, or hydronephrosis. Bladder is unremarkable. Stomach/Bowel: Stomach is within normal limits. Appendix appears normal. There is colonic diverticulosis without evidence of diverticulitis. No evidence of bowel wall thickening, distention, or  inflammatory changes. Vascular/Lymphatic: Aortic atherosclerosis. No enlarged abdominal or pelvic lymph nodes. Reproductive: Status post hysterectomy. No adnexal masses. Other: There is moderate volume free intraperitoneal fluid. No abdominal wall hernia is identified. Musculoskeletal: Degenerative changes are seen in the spine. IMPRESSION: 1. Gallbladder wall thickening with surrounding edema may represent acute cholecystitis. Right upper quadrant ultrasound could be performed if the clinically indicated. 2. Moderate volume free intraperitoneal fluid. 3. Small bilateral pleural effusions with associated atelectasis. Aortic Atherosclerosis (ICD10-I70.0). Electronically Signed   By: Zerita Boers M.D.   On: 09/07/2020 10:37   DG Chest Portable 1 View  Result Date: 09/07/2020 CLINICAL DATA:  Pt states she has been having BP issues onset for several days. Pt denies sob, cp, fever, or N/V. No COVID test ordered at this time. HX of hypertension. EXAM: PORTABLE CHEST 1 VIEW COMPARISON:  10/26/2017.  CT, 11/23/2017. FINDINGS: Heart mildly enlarged.  No mediastinal or hilar masses. Lungs demonstrate vascular prominence. There is interstitial thickening and linear areas of scarring or atelectasis at the lung bases, greater on the left. Additional left lung base opacity obscures hemidiaphragm, which may reflect a pleural effusion in addition to the lung base opacity. No convincing pulmonary edema. No pneumothorax. Skeletal structures are grossly intact. IMPRESSION: 1. Left lung base opacity that is likely a combination of a small effusion and atelectasis. Consider pneumonia if there are consistent clinical findings. 2. Vascular congestion and mild lower lung interstitial thickening without convincing pulmonary edema. Electronically Signed   By: Lajean Manes M.D.   On: 09/07/2020 09:37   ECHOCARDIOGRAM COMPLETE  Result Date: 09/09/2020    ECHOCARDIOGRAM REPORT   Patient Name:   EBONIE WESTERLUND Date of Exam: 09/09/2020  Medical Rec #:  [id number]   Height:       61.0 in Accession #:    [id number]  Weight:       120.0 lb Date of Birth:  [birth year]   BSA:          1.520 m Patient Age:    42 years    BP:           145/112 mmHg Patient Gender: F           HR:           100 bpm. Exam Location:  [facility] Procedure: 2D Echo, Cardiac Doppler and Color Doppler Indications:    Congestive Heart Failure 428.0 /  I50.9  History:        Patient has prior history of Echocardiogram examinations, most                 recent 04/11/2015. Arrythmias:Atrial Fibrillation,                 Signs/Symptoms:Murmur; Risk Factors:Hypertension and                 Dyslipidemia. Chronic anticoagulation.  Sonographer:    Alvino Chapel RCS Referring Phys: [place]  1. Left ventricular ejection fraction, by estimation, is 50 to 55%. The left ventricle has low normal function. The left ventricle has no regional wall motion abnormalities. Left ventricular diastolic parameters are indeterminate.  2. Ventricular septum is flattened in systole and diastole suggesting RV pressure and volume overload. . Right ventricular systolic function is moderately reduced. The right ventricular size is moderately enlarged.  3. Left atrial size was severely dilated.  4. Right atrial size was severely dilated.  5. The mitral valve is normal in structure. Mild to moderate mitral valve regurgitation. No evidence of mitral stenosis.  6. The tricuspid valve is abnormal. Tricuspid valve regurgitation is severe.  7. The aortic valve is tricuspid. Aortic valve regurgitation is mild.  8. The inferior vena cava is dilated in size with <50% respiratory variability, suggesting right atrial pressure of 15 mmHg. FINDINGS  Left Ventricle: Left ventricular ejection fraction, by estimation, is 50 to 55%. The left ventricle has low normal function. The left ventricle has no regional wall motion abnormalities. The left ventricular internal cavity size was normal in size. There is no  left ventricular hypertrophy. Left ventricular diastolic parameters are indeterminate. Right Ventricle: Ventricular septum is flattened in systole and diastole suggesting RV pressure and volume overload. The right ventricular size is moderately enlarged. Right vetricular wall thickness was not assessed. Right ventricular systolic function is moderately reduced. Left Atrium: Left atrial size was severely dilated. Right Atrium: Right atrial size was severely dilated. Pericardium: There is no evidence of pericardial effusion. Mitral Valve: The mitral valve is normal in structure. Mild to moderate mitral valve regurgitation. No evidence of mitral valve stenosis. Tricuspid Valve: The tricuspid valve is abnormal. Tricuspid valve regurgitation is severe. No evidence of tricuspid stenosis. Aortic Valve: The aortic valve is tricuspid. Aortic valve regurgitation is mild. Aortic regurgitation PHT measures 450 msec. Aortic valve mean gradient measures 2.6 mmHg. Aortic valve peak gradient measures 4.8 mmHg. Aortic valve area, by VTI measures 1.38 cm. Pulmonic Valve: The pulmonic valve was not well visualized. Pulmonic valve regurgitation is not visualized. No evidence of pulmonic stenosis. Aorta: The aortic root is normal in size and structure. Pulmonary Artery: Mild pulmonary HTN, PASP is 35 mmHg. Venous: The inferior vena cava is dilated in size with less than 50% respiratory variability, suggesting right atrial pressure of 15 mmHg. IAS/Shunts: The interatrial septum was not well visualized.  LEFT VENTRICLE PLAX 2D LVIDd:         3.73 cm LVIDs:         2.80 cm LV PW:         0.81 cm LV IVS:        0.87 cm LVOT diam:     1.60 cm LV SV:         25 LV SV Index:   16 LVOT Area:     2.01 cm  LV Volumes (MOD) LV vol d, MOD A2C: 44.7 ml LV vol d, MOD A4C: 39.9 ml LV  vol s, MOD A2C: 26.7 ml LV vol s, MOD A4C: 18.1 ml LV SV MOD A2C:     18.0 ml LV SV MOD A4C:     39.9 ml LV SV MOD BP:      19.1 ml RIGHT VENTRICLE RV S prime:     8.00  cm/s TAPSE (M-mode): 1.4 cm LEFT ATRIUM             Index       RIGHT ATRIUM           Index LA diam:        3.80 cm 2.50 cm/m  RA Area:     23.40 cm LA Vol (A2C):   48.7 ml 32.04 ml/m RA Volume:   74.60 ml  49.07 ml/m LA Vol (A4C):   70.8 ml 46.57 ml/m LA Biplane Vol: 59.9 ml 39.40 ml/m  AORTIC VALVE AV Area (Vmax):    1.40 cm AV Area (Vmean):   1.32 cm AV Area (VTI):     1.38 cm AV Vmax:           109.68 cm/s AV Vmean:          76.432 cm/s AV VTI:            0.178 m AV Peak Grad:      4.8 mmHg AV Mean Grad:      2.6 mmHg LVOT Vmax:         76.30 cm/s LVOT Vmean:        50.350 cm/s LVOT VTI:          0.122 m LVOT/AV VTI ratio: 0.69 AI PHT:            450 msec  AORTA Ao Root diam: 3.10 cm MITRAL VALVE                 TRICUSPID VALVE MV Area (PHT): 5.16 cm      TR Peak grad:   19.9 mmHg MV Decel Time: 147 msec      TR Vmax:        223.00 cm/s MR Peak grad:    117.1 mmHg MR Mean grad:    72.0 mmHg   SHUNTS MR Vmax:         541.00 cm/s Systemic VTI:  0.12 m MR Vmean:        392.0 cm/s  Systemic Diam: 1.60 cm MR PISA:         0.25 cm MR PISA Eff ROA: 1 mm MR PISA Radius:  0.20 cm MV E velocity: 86.30 cm/s Carlyle Dolly MD Electronically signed by Carlyle Dolly MD Signature Date/Time: 09/09/2020/1:13:32 PM    Final    US Abdomen Limited RUQ (LIVER/GB)  Result Date: 09/08/2020 CLINICAL DATA:  Right upper quadrant pain. EXAM: ULTRASOUND ABDOMEN LIMITED RIGHT UPPER QUADRANT COMPARISON:  CT abdomen pelvis 09/07/2020 FINDINGS: Gallbladder: Gallbladder wall thickness upper limits of normal. Trace pericholecystic fluid. Negative sonographic Murphy sign. Possible 4 mm gallbladder polyp. Common bile duct: Diameter: 5.7 mm. Liver: No focal lesion identified. Within normal limits in parenchymal echogenicity. Questioned mass adjacent to the liver likely represents small adenoma demonstrated on prior CT. Portal vein is patent on color Doppler imaging with normal direction of blood flow towards the liver. Other:  Small right pleural effusion. Enlarged IVC and hepatic veins. IMPRESSION: 1. No cholelithiasis. Gallbladder wall thickness upper limits of normal with trace pericholecystic fluid. Negative sonographic Murphy sign. 2. Enlarged IVC and hepatic veins suggesting some component of right heart  failure. Electronically Signed   By: Lovey Newcomer M.D.   On: 09/08/2020 10:16    Orson Eva, DO  [facility]  If 7PM-7AM, please contact night-coverage [email or web address] Password [email or web address] 09/10/2020, 9:10 AM   LOS: 3 days

## 2020-09-10 NOTE — Evaluation (Signed)
Clinical/Bedside Swallow Evaluation Patient Details  Name: SEE Norma Barajas MRN: [id number] Date of Birth: [birth year]  Today's Date: 09/10/2020 Time: SLP Start Time (ACUTE ONLY): 1114 SLP Stop Time (ACUTE ONLY): 1142 SLP Time Calculation (min) (ACUTE ONLY): 28 min  Past Medical History:  Past Medical History:  Diagnosis Date  . 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 Anxiety   . Arthritis    "right leg" (04/11/2015)  . BPPV (benign paroxysmal positional vertigo) 03/23/2016  . Cataract   . Coronary atherosclerosis of native coronary artery    a. Nonobstructive minimal CAD 10/2005.  Marland Kitchen Depression   . Diastolic dysfunction    Grade 1. Ejection fraction 60-65%.  Marland Kitchen Dysrhythmia    a fib  . Erosive esophagitis   . Essential hypertension   . GERD (gastroesophageal reflux disease)   . Grade III hemorrhoids   . History of hiatal hernia   . Hypercholesterolemia   . Internal hemorrhoids with complication 06/17/1750   OCT 2015 FLEX SIG/IH BANDING    . Migraine    "used to have them right bad; I don't now" (04/11/2015)  . MITRAL REGURGITATION 04/27/2010   Qualifier: Diagnosis of  By: [name], MD, [name]   . Osteoporosis   . Paroxysmal atrial fibrillation (HCC)   . PONV (postoperative nausea and vomiting)   . Vertigo    Past Surgical History:  Past Surgical History:  Procedure Laterality Date  . BRAVO [place] STUDY  11/15/2012   Procedure: BRAVO [place];  Surgeon: [name], MD;  Location: [facility];  Service: Endoscopy;;  . CARDIOVERSION N/A 05/16/2020   Procedure: CARDIOVERSION;  Surgeon: Donato Heinz, MD;  Location: [facility] ENDOSCOPY;  Service: Cardiovascular;  Laterality: N/A;  . CATARACT EXTRACTION W/ INTRAOCULAR LENS  IMPLANT, BILATERAL Bilateral   . COLONOSCOPY  2008   Dr. [name]: internal hemorrhoids   . DILATION AND CURETTAGE OF UTERUS    . ESOPHAGOGASTRODUODENOSCOPY (EGD) WITH ESOPHAGEAL DILATION  2001   Dr. [name]: erosive esophagitis, esophageal  stricture, duodenitis, s/p Savary dilation  . ESOPHAGOGASTRODUODENOSCOPY (EGD) WITH ESOPHAGEAL DILATION  11/15/2012   [facility]:ENIDPOEUMP web was found & MOST LIKELY CAUSE FOR DYAPHAGIA/Polyp was found in the gastric body and gastric fundus/ gastritis on bx  . EYE SURGERY Bilateral    "laser OR after cataract OR; cause I couldn't see"  . FLEXIBLE SIGMOIDOSCOPY N/A 08/22/2014   mild diverticulosis in sigmoid, moderate sized Grade 3 hemorrhoids s/p banding X 3.   Marland Kitchen FRACTURE SURGERY Right    below the knee - 2 bones broke has plates  . HEMORRHOID BANDING N/A 08/22/2014   Procedure: HEMORRHOID BANDING;  Surgeon: [name], MD;  Location: [facility];  Service: Endoscopy;  Laterality: N/A;  . HEMORRHOID SURGERY N/A 03/30/2018   Procedure: EXTENSIVE HEMORRHOIDECTOMY;  Surgeon: [name], MD;  Location: [facility] ORS;  Service: General;  Laterality: N/A;  . OPEN REDUCTION INTERNAL FIXATION (ORIF) TIBIA/FIBULA FRACTURE Right 2013   broke tibia and fibula after falling down stairs  . PROLAPSED UTERINE FIBROID LIGATION  2015  . TOTAL ABDOMINAL HYSTERECTOMY     HPI:  LARAINE Barajas is a 79 y.o. female with medical history significant of hypertension, hyperlipidemia, atrial fibrillation on anticoagulation, presents to the emergency room with complaints of intermittent abdominal pain for the past 2 months.  Symptoms have progressively gotten worse, particularly in the last 24 hours.  Pain is described in the right upper quadrant, epigastrium radiating to her right  upper back.  Symptoms are worse with p.o. intake.  She has had nausea, but no vomiting.  Bowel movements have otherwise been normal.  She is not had any fever.  She also does describe progressive shortness of breath over the last few months.  She reports is easier to breathe when she lays down flat, dyspnea is worse on exertion. BSE requested as Pt reported difficulty swallowing her pills yesterday.   Assessment / Plan / Recommendation Clinical  Impression  Clinical swallow evaluation completed. Pt reports globus sensation when taking her pills and some solid foods. She indicates similar symptoms several years ago (2014) and underwent EGD with dilation and noted relief in symptoms. Oral motor examination is WNL. Pt with upper dentures and lower partials. She consumed ice chips, thin water via straw sips, puree, and regular textures with one small cough after initial sip of water. Pt points to neck as location for globus sensation when taking pills, however suspect this could be referred from esophagus given history of esophageal web. Pt tells SLP that she is scheduled for an appointment with Dr. Laural Golden, however SLP can only see a canceled appointment for Monday with Dr. Jenetta Downer. Recommend GI consult if not already in the works, as symptoms most consistent with esophageal dysphagia. SLP can complete MBSS if MD desires. Recommend regular textures and chopped meats and thin liquids with standard aspiration and reflux precautions given suspicion for esophageal dysphagia. Pt in agreement with plan of care. SLP will follow.    SLP Visit Diagnosis: Dysphagia, unspecified (R13.10)    Aspiration Risk  Mild aspiration risk    Diet Recommendation Dysphagia 3 (Mech soft);Thin liquid   Liquid Administration via: Cup;Straw Medication Administration: Whole meds with liquid Supervision: Patient able to self feed Compensations:  (small bites of solids) Postural Changes: Seated upright at 90 degrees;Remain upright for at least 30 minutes after po intake    Other  Recommendations Recommended Consults: Consider GI evaluation;Consider esophageal assessment Oral Care Recommendations: Oral care BID;Patient independent with oral care Other Recommendations: Clarify dietary restrictions   Follow up Recommendations None      Frequency and Duration min 2x/week  1 week       Prognosis Prognosis for Safe Diet Advancement: Good      Swallow Study    General Date of Onset: 09/07/20 HPI: Norma Barajas is a 79 y.o. female with medical history significant of hypertension, hyperlipidemia, atrial fibrillation on anticoagulation, presents to the emergency room with complaints of intermittent abdominal pain for the past 2 months.  Symptoms have progressively gotten worse, particularly in the last 24 hours.  Pain is described in the right upper quadrant, epigastrium radiating to her right upper back.  Symptoms are worse with p.o. intake.  She has had nausea, but no vomiting.  Bowel movements have otherwise been normal.  She is not had any fever.  She also does describe progressive shortness of breath over the last few months.  She reports is easier to breathe when she lays down flat, dyspnea is worse on exertion. BSE requested as Pt reported difficulty swallowing her pills yesterday. Type of Study: Bedside Swallow Evaluation Previous Swallow Assessment: Pt had EGD in 2014 with dilation for esophageal web Diet Prior to this Study: Regular;Thin liquids Temperature Spikes Noted: No Respiratory Status: Room air History of Recent Intubation: No Behavior/Cognition: Alert;Cooperative;Pleasant mood Oral Cavity Assessment: Within Functional Limits Oral Care Completed by SLP: No Oral Cavity - Dentition: Dentures, top;Dentures, bottom Vision: Functional for self-feeding Self-Feeding Abilities: Able  to feed self Patient Positioning: Upright in bed Baseline Vocal Quality: Normal Volitional Cough: Strong Volitional Swallow: Able to elicit    Oral/Motor/Sensory Function Overall Oral Motor/Sensory Function: Within functional limits   Ice Chips Ice chips: Within functional limits   Thin Liquid Thin Liquid: Within functional limits Presentation: Cup;Self Fed;Straw    Nectar Thick Nectar Thick Liquid: Not tested   Honey Thick Honey Thick Liquid: Not tested   Puree Puree: Within functional limits Presentation: Spoon   Solid     Solid: Within functional  limits Presentation: Self Fed     Thank you,  Genene Churn, [place]  [name] 09/10/2020,12:43 PM

## 2020-09-10 NOTE — Progress Notes (Signed)
[facility] Progress Note     Subjective: Patient reports not having abdominal pain at present. She reports having significant back pain, a chronic concern for her. Endorses normal bowel movements. Denies emesis.   Objective: Vital signs in last 24 hours: Temp:  [98.2 F (36.8 C)-98.6 F (37 C)] 98.6 F (37 C) (11/03 0830) Pulse Rate:  [104-110] 107 (11/03 0830) Resp:  [17-18] 17 (11/03 0830) BP: (128-139)/(103-105) 139/105 (11/03 0830) SpO2:  [92 %-96 %] 96 % (11/03 0830) Weight:  [52 kg] 52 kg (11/03 1105) Last BM Date: 09/06/20  Intake/Output from previous day: 11/02 0701 - 11/03 0700 In: 720 [P.O.:720] Out: -  Intake/Output this shift: Total I/O In: 360 [P.O.:360] Out: 850 [Urine:850]  General appearance: alert, cooperative and no distress Resp: normal work of breathing GI: soft, non-tender  Lab Results:  Recent Labs    09/09/20 0553 09/10/20 0442  WBC 6.1 5.8  HGB 15.1* 15.8*  HCT 47.4* 48.6*  PLT 177 184   BMET Recent Labs    09/09/20 0553 09/10/20 0442  NA 139 139  K 3.2* 3.1*  CL 98 95*  CO2 28 29  GLUCOSE 85 112*  BUN 14 13  CREATININE 0.97 0.98  CALCIUM 8.7* 8.7*   PT/INR Recent Labs    09/09/20 0553 09/10/20 0442  LABPROT 29.3* 26.4*  INR 2.9* 2.5*    Studies/Results: ECHOCARDIOGRAM COMPLETE  Result Date: 09/09/2020    ECHOCARDIOGRAM REPORT   Patient Name:   Norma Barajas Date of Exam: 09/09/2020 Medical Rec #:  [id number]   Height:       61.0 in Accession #:    [id number]  Weight:       120.0 lb Date of Birth:  10-Dec-1940   BSA:          1.520 m Patient Age:    74 years    BP:           145/112 mmHg Patient Gender: F           HR:           100 bpm. Exam Location:  [facility] Procedure: 2D Echo, Cardiac Doppler and Color Doppler Indications:    Congestive Heart Failure 428.0 / I50.9  History:        Patient has prior history of Echocardiogram examinations, most                 recent 04/11/2015. Arrythmias:Atrial  Fibrillation,                 Signs/Symptoms:Murmur; Risk Factors:Hypertension and                 Dyslipidemia. Chronic anticoagulation.  Sonographer:    Alvino Chapel RCS Referring Phys: [place]  1. Left ventricular ejection fraction, by estimation, is 50 to 55%. The left ventricle has low normal function. The left ventricle has no regional wall motion abnormalities. Left ventricular diastolic parameters are indeterminate.  2. Ventricular septum is flattened in systole and diastole suggesting RV pressure and volume overload. . Right ventricular systolic function is moderately reduced. The right ventricular size is moderately enlarged.  3. Left atrial size was severely dilated.  4. Right atrial size was severely dilated.  5. The mitral valve is normal in structure. Mild to moderate mitral valve regurgitation. No evidence of mitral stenosis.  6. The tricuspid valve is abnormal. Tricuspid valve regurgitation is severe.  7. The aortic valve is tricuspid. Aortic valve regurgitation is  mild.  8. The inferior vena cava is dilated in size with <50% respiratory variability, suggesting right atrial pressure of 15 mmHg. FINDINGS  Left Ventricle: Left ventricular ejection fraction, by estimation, is 50 to 55%. The left ventricle has low normal function. The left ventricle has no regional wall motion abnormalities. The left ventricular internal cavity size was normal in size. There is no left ventricular hypertrophy. Left ventricular diastolic parameters are indeterminate. Right Ventricle: Ventricular septum is flattened in systole and diastole suggesting RV pressure and volume overload. The right ventricular size is moderately enlarged. Right vetricular wall thickness was not assessed. Right ventricular systolic function is moderately reduced. Left Atrium: Left atrial size was severely dilated. Right Atrium: Right atrial size was severely dilated. Pericardium: There is no evidence of pericardial  effusion. Mitral Valve: The mitral valve is normal in structure. Mild to moderate mitral valve regurgitation. No evidence of mitral valve stenosis. Tricuspid Valve: The tricuspid valve is abnormal. Tricuspid valve regurgitation is severe. No evidence of tricuspid stenosis. Aortic Valve: The aortic valve is tricuspid. Aortic valve regurgitation is mild. Aortic regurgitation PHT measures 450 msec. Aortic valve mean gradient measures 2.6 mmHg. Aortic valve peak gradient measures 4.8 mmHg. Aortic valve area, by VTI measures 1.38 cm. Pulmonic Valve: The pulmonic valve was not well visualized. Pulmonic valve regurgitation is not visualized. No evidence of pulmonic stenosis. Aorta: The aortic root is normal in size and structure. Pulmonary Artery: Mild pulmonary HTN, PASP is 35 mmHg. Venous: The inferior vena cava is dilated in size with less than 50% respiratory variability, suggesting right atrial pressure of 15 mmHg. IAS/Shunts: The interatrial septum was not well visualized.  LEFT VENTRICLE PLAX 2D LVIDd:         3.73 cm LVIDs:         2.80 cm LV PW:         0.81 cm LV IVS:        0.87 cm LVOT diam:     1.60 cm LV SV:         25 LV SV Index:   16 LVOT Area:     2.01 cm  LV Volumes (MOD) LV vol d, MOD A2C: 44.7 ml LV vol d, MOD A4C: 39.9 ml LV vol s, MOD A2C: 26.7 ml LV vol s, MOD A4C: 18.1 ml LV SV MOD A2C:     18.0 ml LV SV MOD A4C:     39.9 ml LV SV MOD BP:      19.1 ml RIGHT VENTRICLE RV S prime:     8.00 cm/s TAPSE (M-mode): 1.4 cm LEFT ATRIUM             Index       RIGHT ATRIUM           Index LA diam:        3.80 cm 2.50 cm/m  RA Area:     23.40 cm LA Vol (A2C):   48.7 ml 32.04 ml/m RA Volume:   74.60 ml  49.07 ml/m LA Vol (A4C):   70.8 ml 46.57 ml/m LA Biplane Vol: 59.9 ml 39.40 ml/m  AORTIC VALVE AV Area (Vmax):    1.40 cm AV Area (Vmean):   1.32 cm AV Area (VTI):     1.38 cm AV Vmax:           109.68 cm/s AV Vmean:          76.432 cm/s AV VTI:  0.178 m AV Peak Grad:      4.8 mmHg AV Mean  Grad:      2.6 mmHg LVOT Vmax:         76.30 cm/s LVOT Vmean:        50.350 cm/s LVOT VTI:          0.122 m LVOT/AV VTI ratio: 0.69 AI PHT:            450 msec  AORTA Ao Root diam: 3.10 cm MITRAL VALVE                 TRICUSPID VALVE MV Area (PHT): 5.16 cm      TR Peak grad:   19.9 mmHg MV Decel Time: 147 msec      TR Vmax:        223.00 cm/s MR Peak grad:    117.1 mmHg MR Mean grad:    72.0 mmHg   SHUNTS MR Vmax:         541.00 cm/s Systemic VTI:  0.12 m MR Vmean:        392.0 cm/s  Systemic Diam: 1.60 cm MR PISA:         0.25 cm MR PISA Eff ROA: 1 mm MR PISA Radius:  0.20 cm MV E velocity: 86.30 cm/s Carlyle Dolly MD Electronically signed by Carlyle Dolly MD Signature Date/Time: 09/09/2020/1:13:32 PM    Final     Anti-infectives: Anti-infectives (From admission, onward)   Start     Dose/Rate Route Frequency Ordered Stop   09/07/20 1900  piperacillin-tazobactam (ZOSYN) IVPB 3.375 g  Status:  Discontinued        3.375 g 12.5 mL/hr over 240 Minutes Intravenous Every 8 hours 09/07/20 1630 09/08/20 1155   09/07/20 1100  piperacillin-tazobactam (ZOSYN) IVPB 3.375 g        3.375 g 100 mL/hr over 30 Minutes Intravenous  Once 09/07/20 1056 09/07/20 1210      Assessment/Plan: Patient is a 27 female with history of atrial fibrillation on home coumadin, who had presented with concerns for acholic cholecystitis per abdominal CT and RUQ ultrasound; with same ultrasound additionally conveying enlarged IVC and hepatic veins congruent with heart failure, and subsequent echo indicative of severe tricuspid regurgitation, and RV volume overload indicative of cor pulmonale. She remains afebrile, with normal WBC count; and re-assuring abdominal exam. Day to day symptoms are varied in nature, with congestive heart failure and chronic back pain most likely etiologies at this time. Surgical intervention not indicated at this time.   -Neuro: PRN analgesic -Cardiac: Daily Lasix for HF symptomatic management -GI:  Patient would benefit from f/u with Dr. Lucianne Muss for further examination.    LOS: 3 days    Janne Lab 09/10/2020

## 2020-09-10 NOTE — Plan of Care (Signed)

## 2020-09-10 NOTE — TOC Initial Note (Signed)
Transition of Care [facility]) - Initial/Assessment Note    Patient Details  Name: Norma Barajas MRN: [id number] Date of Birth: 05-22-1941  Transition of Care [facility]) CM/SW Contact:    Salome Arnt, [place] Phone Number: 09/10/2020, 8:55 AM  Clinical Narrative:  Pt admitted due to abdominal pain, initially felt to be acute cholecystitis. PT evaluated pt and recommend home health PT. She lives with her husband. Pt's husband reports he provides some assist with ADLs. Pt typically ambulates with a cane. LCSW discussed HHPT with husband who is agreeable with no preference for home health agency. Cory with [facility] has accepted referral. Will need home health orders at d/c. TOC will continue to follow.                    Expected Discharge Plan: [place] Barriers to Discharge: Continued Medical Work up   Patient Goals and CMS Choice Patient states their goals for this hospitalization and ongoing recovery are:: return home   Choice offered to / list presented to : Spouse  Expected Discharge Plan and Services Expected Discharge Plan: [place] In-house Referral: Clinical Social Work   Post Acute Care Choice: [place] arrangements for the past 2 months: Single Family Home                 DME Arranged: N/A DME Agency: NA       HH Arranged: PT [place] Agency: [place] Date HH Agency Contacted: 09/10/20 Time HH Agency Contacted: [phone] Representative spoke with at [place]: Tommi Rumps  Prior Living Arrangements/Services Living arrangements for the past 2 months: [place] Lives with:: Spouse Patient language and need for interpreter reviewed:: Yes Do you feel safe going back to the place where you live?: Yes      Need for Family Participation in Patient Care: Yes (Comment) Care giver support system in place?: Yes (comment) Current home services: DME (wheelchair, walker, ramp, cane, 3N1) Criminal Activity/Legal Involvement  Pertinent to Current Situation/Hospitalization: No - Comment as needed  Activities of Daily Living Home Assistive Devices/Equipment: Cane (specify quad or straight) ADL Screening (condition at time of admission) Patient's cognitive ability adequate to safely complete daily activities?: Yes Is the patient deaf or have difficulty hearing?: No Does the patient have difficulty seeing, even when wearing glasses/contacts?: No Does the patient have difficulty concentrating, remembering, or making decisions?: No Patient able to express need for assistance with ADLs?: Yes Does the patient have difficulty dressing or bathing?: No Independently performs ADLs?: Yes (appropriate for developmental age) Does the patient have difficulty walking or climbing stairs?: Yes Weakness of Legs: Both Weakness of Arms/Hands: Both  Permission Sought/Granted                  Emotional Assessment         Alcohol / Substance Use: Not Applicable Psych Involvement: No (comment)  Admission diagnosis:  Acute cholecystitis [K81.0] Cholecystitis [K81.9] RUQ pain [R10.11] Atrial fibrillation with rapid ventricular response ([place]) [I48.91] Anticoagulated on Coumadin [Z79.01] Patient Active Problem List   Diagnosis Date Noted  . Biliary colic   . Cholecystitis 09/07/2020  . Early satiety 07/15/2020  . Anorexia 07/15/2020  . Abdominal pain 07/15/2020  . Educated about COVID-19 virus infection 02/26/2020  . Chronic anticoagulation 07/17/2019  . Difficulty sleeping 06/22/2019  . Loose bowel movements 06/22/2019  . Atrial fibrillation ([place]) 08/21/2015  . Vitamin D deficiency 11/27/2014  . Coronary atherosclerosis of  native coronary artery 07/24/2013  . Pulmonary nodule 02/18/2011  . Hyperlipidemia 07/29/2008  . GAD (generalized anxiety disorder) 07/29/2008  . Essential hypertension 07/29/2008  . GERD 07/29/2008  . Osteoporosis 07/29/2008   PCP:  Loman Brooklyn, FNP Pharmacy:   [facility], [address] [place] [address] Phone: [phone] Fax: [phone]  [place], [address] [place] [address] Phone: [phone] Fax: [phone]     Social Determinants of Health (SDOH) Interventions    Readmission Risk Interventions No flowsheet data found.

## 2020-09-10 NOTE — Evaluation (Signed)
Physical Therapy Evaluation Patient Details Name: Norma Barajas MRN: [id number] DOB: 02-22-1941 Today's Date: 09/10/2020   History of Present Illness  Norma Barajas is a 79 y.o. female with medical history significant of hypertension, hyperlipidemia, atrial fibrillation on anticoagulation, presents to the emergency room with complaints of intermittent abdominal pain for the past 2 months.  Symptoms have progressively gotten worse, particularly in the last 24 hours.  Pain is described in the right upper quadrant, epigastrium radiating to her right upper back.  Symptoms are worse with p.o. intake.  She has had nausea, but no vomiting.  Bowel movements have otherwise been normal.  She is not had any fever.  She also does describe progressive shortness of breath over the last few months.  She reports is easier to breathe when she lays down flat, dyspnea is worse on exertion    Clinical Impression  Patient functioning near baseline for functional mobility and gait, demonstrates good return for sitting up at bedside, has to occasionally lean on nearby objects during ambulation in room/hallway, no loss of balance and limited mostly due to c/o fatigue.  Patient encouraged to ambulate as tolerated while in hospital and to use her cane or RW as needed when she returns home with understanding acknowledged.  Plan:  Patient discharged from physical therapy to care of nursing for ambulation daily as tolerated for length of stay.     Follow Up Recommendations Home health PT;Supervision for mobility/OOB;Supervision - Intermittent    Equipment Recommendations  None recommended by PT    Recommendations for Other Services       Precautions / Restrictions Precautions Precautions: Fall Restrictions Weight Bearing Restrictions: No      Mobility  Bed Mobility Overal bed mobility: Modified Independent                  Transfers Overall transfer level: Modified independent Equipment used: None;1 person  hand held assist             General transfer comment: increased time, occasional leaning on nearby objects for support  Ambulation/Gait Ambulation/Gait assistance: Supervision Gait Distance (Feet): 75 Feet Assistive device: None;1 person hand held assist Gait Pattern/deviations: Decreased step length - right;Decreased step length - left;Decreased stride length Gait velocity: decreased   General Gait Details: slightly labored cadence without loss of balance, occasional leaning on side rail for support, limited secondary to c/o fatigue  Stairs            Wheelchair Mobility    Modified Rankin (Stroke Patients Only)       Balance Overall balance assessment: Mild deficits observed, not formally tested                                           Pertinent Vitals/Pain Pain Assessment: 0-10 Pain Score: 7  Pain Location: chronic low back Pain Descriptors / Indicators: Aching;Sore Pain Intervention(s): Limited activity within patient's tolerance;Monitored during session;Repositioned    Home Living Family/patient expects to be discharged to:: Private residence Living Arrangements: Spouse/significant other Available Help at Discharge: Family;Available 24 hours/day Type of Home: House Home Access: Stairs to enter;Ramped entrance Entrance Stairs-Rails: Right Entrance Stairs-Number of Steps: 4 Home Layout: One level Home Equipment: Walker - 2 wheels;Cane - single point;Wheelchair - manual      Prior Function Level of Independence: Independent with assistive device(s)  Comments: household and short distanced community ambulator using SPC PRN, occasionally drives     Hand Dominance        Extremity/Trunk Assessment   Upper Extremity Assessment Upper Extremity Assessment: Overall WFL for tasks assessed    Lower Extremity Assessment Lower Extremity Assessment: Generalized weakness    Cervical / Trunk Assessment Cervical / Trunk  Assessment: Normal  Communication   Communication: No difficulties  Cognition Arousal/Alertness: Awake/alert Behavior During Therapy: WFL for tasks assessed/performed Overall Cognitive Status: Within Functional Limits for tasks assessed                                        General Comments      Exercises     Assessment/Plan    PT Assessment All further PT needs can be met in the next venue of care  PT Problem List Decreased strength;Decreased activity tolerance;Decreased balance;Decreased mobility       PT Treatment Interventions      PT Goals (Current goals can be found in the Care Plan section)  Acute Rehab PT Goals Patient Stated Goal: return home with family to assist PT Goal Formulation: With patient/family Time For Goal Achievement: 09/10/20 Potential to Achieve Goals: Good    Frequency     Barriers to discharge        Co-evaluation               AM-PAC PT "6 Clicks" Mobility  Outcome Measure Help needed turning from your back to your side while in a flat bed without using bedrails?: None Help needed moving from lying on your back to sitting on the side of a flat bed without using bedrails?: None Help needed moving to and from a bed to a chair (including a wheelchair)?: A Little Help needed standing up from a chair using your arms (e.g., wheelchair or bedside chair)?: None Help needed to walk in hospital room?: A Little Help needed climbing 3-5 steps with a railing? : A Little 6 Click Score: 21    End of Session   Activity Tolerance: Patient tolerated treatment well;Patient limited by fatigue Patient left: in bed;with call bell/phone within reach;with family/visitor present;with nursing/sitter in room Nurse Communication: Mobility status PT Visit Diagnosis: Unsteadiness on feet (R26.81);Other abnormalities of gait and mobility (R26.89);Muscle weakness (generalized) (M62.81)    Time: 8845-7334 PT Time Calculation (min) (ACUTE  ONLY): 16 min   Charges:   PT Evaluation $PT Eval Low Complexity: 1 Low PT Treatments $Therapeutic Activity: 8-22 mins        9:26 AM, 09/10/20 Lonell Grandchild, MPT Physical Therapist with [facility] [phone] office [phone] mobile phone

## 2020-09-11 ENCOUNTER — Inpatient Hospital Stay (HOSPITAL_COMMUNITY): Payer: Medicare HMO

## 2020-09-11 ENCOUNTER — Ambulatory Visit: Payer: Medicare HMO | Admitting: Pharmacist

## 2020-09-11 ENCOUNTER — Telehealth: Payer: Self-pay | Admitting: Gastroenterology

## 2020-09-11 DIAGNOSIS — R1011 Right upper quadrant pain: Secondary | ICD-10-CM

## 2020-09-11 DIAGNOSIS — I4891 Unspecified atrial fibrillation: Secondary | ICD-10-CM | POA: Diagnosis not present

## 2020-09-11 DIAGNOSIS — I50811 Acute right heart failure: Secondary | ICD-10-CM | POA: Diagnosis not present

## 2020-09-11 DIAGNOSIS — R1319 Other dysphagia: Secondary | ICD-10-CM

## 2020-09-11 LAB — CBC
HCT: 49 % — ABNORMAL HIGH (ref 36.0–46.0)
Hemoglobin: 15.5 g/dL — ABNORMAL HIGH (ref 12.0–15.0)
MCH: 30.7 pg (ref 26.0–34.0)
MCHC: 31.6 g/dL (ref 30.0–36.0)
MCV: 97 fL (ref 80.0–100.0)
Platelets: 184 10*3/uL (ref 150–400)
RBC: 5.05 MIL/uL (ref 3.87–5.11)
RDW: 19.1 % — ABNORMAL HIGH (ref 11.5–15.5)
WBC: 5.9 10*3/uL (ref 4.0–10.5)
nRBC: 0 % (ref 0.0–0.2)

## 2020-09-11 LAB — COMPREHENSIVE METABOLIC PANEL
ALT: 20 U/L (ref 0–44)
AST: 35 U/L (ref 15–41)
Albumin: 3.5 g/dL (ref 3.5–5.0)
Alkaline Phosphatase: 63 U/L (ref 38–126)
Anion gap: 13 (ref 5–15)
BUN: 15 mg/dL (ref 8–23)
CO2: 30 mmol/L (ref 22–32)
Calcium: 8.4 mg/dL — ABNORMAL LOW (ref 8.9–10.3)
Chloride: 95 mmol/L — ABNORMAL LOW (ref 98–111)
Creatinine, Ser: 1.06 mg/dL — ABNORMAL HIGH (ref 0.44–1.00)
GFR, Estimated: 53 mL/min — ABNORMAL LOW (ref >60)
Glucose, Bld: 107 mg/dL — ABNORMAL HIGH (ref 70–99)
Potassium: 3.1 mmol/L — ABNORMAL LOW (ref 3.5–5.1)
Sodium: 138 mmol/L (ref 135–145)
Total Bilirubin: 1 mg/dL (ref 0.3–1.2)
Total Protein: 6.4 g/dL — ABNORMAL LOW (ref 6.5–8.1)

## 2020-09-11 LAB — URINE CULTURE: Culture: NO GROWTH

## 2020-09-11 LAB — HEPARIN LEVEL (UNFRACTIONATED): Heparin Unfractionated: <0.1 IU/mL — ABNORMAL LOW (ref 0.30–0.70)

## 2020-09-11 LAB — PROTIME-INR
INR: 1.8 — ABNORMAL HIGH (ref 0.8–1.2)
Prothrombin Time: 19.9 seconds — ABNORMAL HIGH (ref 11.4–15.2)

## 2020-09-11 LAB — MAGNESIUM: Magnesium: 1.9 mg/dL (ref 1.7–2.4)

## 2020-09-11 MED ORDER — METOPROLOL TARTRATE 25 MG PO TABS
25.0000 mg | ORAL_TABLET | Freq: Once | ORAL | Status: AC
  Administered 2020-09-11: 25 mg via ORAL
  Filled 2020-09-11: qty 1

## 2020-09-11 MED ORDER — HEPARIN (PORCINE) 25000 UT/250ML-% IV SOLN
950.0000 [IU]/h | INTRAVENOUS | Status: DC
  Administered 2020-09-11: 750 [IU]/h via INTRAVENOUS
  Filled 2020-09-11: qty 250

## 2020-09-11 MED ORDER — POTASSIUM CHLORIDE 20 MEQ/15ML (10%) PO SOLN
40.0000 meq | Freq: Once | ORAL | Status: AC
  Administered 2020-09-11: 40 meq via ORAL
  Filled 2020-09-11: qty 30

## 2020-09-11 MED ORDER — HEPARIN BOLUS VIA INFUSION
1000.0000 [IU] | Freq: Once | INTRAVENOUS | Status: AC
  Administered 2020-09-11: 1000 [IU] via INTRAVENOUS
  Filled 2020-09-11: qty 1000

## 2020-09-11 MED ORDER — METOPROLOL TARTRATE 50 MG PO TABS
75.0000 mg | ORAL_TABLET | Freq: Two times a day (BID) | ORAL | Status: DC
  Administered 2020-09-11: 75 mg via ORAL
  Filled 2020-09-11 (×2): qty 1

## 2020-09-11 MED ORDER — FUROSEMIDE 10 MG/ML IJ SOLN
40.0000 mg | Freq: Once | INTRAMUSCULAR | Status: AC
  Administered 2020-09-11: 40 mg via INTRAVENOUS
  Filled 2020-09-11: qty 4

## 2020-09-11 NOTE — Progress Notes (Signed)
Subjective:  Patient denies abd pain, n/v. BM this morning soft, yellow. No melena, brbpr. Waiting for barium esophagram to evaluate dysphagia.   Objective: Vital signs in last 24 hours: Temp:  [98.1 F (36.7 C)-98.6 F (37 C)] 98.5 F (36.9 C) (11/04 0510) Pulse Rate:  [105-110] 105 (11/04 0510) Resp:  [17-18] 18 (11/04 0510) BP: (136-149)/(104-106) 136/106 (11/04 0510) SpO2:  [94 %-96 %] 94 % (11/04 0510) Weight:  [51.8 kg-52 kg] 51.8 kg (11/04 0510) Last BM Date: 09/06/20 General:   Alert,  Well-developed, well-nourished, pleasant and cooperative in NAD Head:  Normocephalic and atraumatic. Eyes:  Sclera clear, no icterus.  Abdomen:  Soft, nontender and nondistended. Normal bowel sounds, without guarding, and without rebound.  Liver edge easily palpable. Extremities:  Without clubbing, deformity or edema. Neurologic:  Alert and  oriented x4;  grossly normal neurologically. Skin:  Intact without significant lesions or rashes. Psych:  Alert and cooperative. Normal mood and affect.  Intake/Output from previous day: 11/03 0701 - 11/04 0700 In: 620 [P.O.:620] Out: 1750 [Urine:1750] Intake/Output this shift: No intake/output data recorded.  Lab Results: CBC Recent Labs    09/09/20 0553 09/10/20 0442 09/11/20 0439  WBC 6.1 5.8 5.9  HGB 15.1* 15.8* 15.5*  HCT 47.4* 48.6* 49.0*  MCV 96.3 94.7 97.0  PLT 177 184 184   BMET Recent Labs    09/09/20 0553 09/10/20 0442 09/11/20 0439  NA 139 139 138  K 3.2* 3.1* 3.1*  CL 98 95* 95*  CO2 28 29 30   GLUCOSE 85 112* 107*  BUN 14 13 15   CREATININE 0.97 0.98 1.06*  CALCIUM 8.7* 8.7* 8.4*   LFTs Recent Labs    09/09/20 0553 09/11/20 0439  BILITOT 1.3* 1.0  ALKPHOS 56 63  AST 28 35  ALT 18 20  PROT 6.2* 6.4*  ALBUMIN 3.4* 3.5   No results for input(s): LIPASE in the last 72 hours. PT/INR Recent Labs    09/09/20 0553 09/10/20 0442 09/11/20 0439  LABPROT 29.3* 26.4* 19.9*  INR 2.9* 2.5* 1.8*      Imaging  Studies: DG Lumbar Spine 2-3 Views  Result Date: 08/18/2020 CLINICAL DATA:  79 year old female with low back pain. EXAM: LUMBAR SPINE - 2-3 VIEW COMPARISON:  Lumbar spine radiograph dated 07/31/2015. FINDINGS: There is no acute fracture or subluxation of the lumbar spine. There is osteopenia with degenerative changes and lumbar levoscoliosis. Multilevel facet arthropathy. There is atherosclerotic calcification of the aorta. The soft tissues are unremarkable. IMPRESSION: No acute fracture or subluxation of the lumbar spine. Electronically Signed   By: Anner Crete M.D.   On: 08/18/2020 22:16   CT ABDOMEN PELVIS W CONTRAST  Result Date: 09/07/2020 CLINICAL DATA:  Abdominal pain, lower back pain for 3 months. EXAM: CT ABDOMEN AND PELVIS WITH CONTRAST TECHNIQUE: Multidetector CT imaging of the abdomen and pelvis was performed using the standard protocol following bolus administration of intravenous contrast. CONTRAST:  90mL OMNIPAQUE IOHEXOL 300 MG/ML  SOLN COMPARISON:  CT abdomen pelvis dated 07/10/2019 and other prior exams. FINDINGS: Lower chest: There are small bilateral pleural effusions with associated atelectasis. Hepatobiliary: Two hepatic cysts are redemonstrated. There is a gallbladder wall thickening with surrounding edema. No definite gallstones are identified. No biliary dilatation. Pancreas: A 6 mm low attenuating mass in the pancreatic tail is not significantly changed since at least 11/28/2012 and presumed benign. No pancreatic ductal dilatation or surrounding inflammatory changes. Spleen: Normal in size without focal abnormality. Adrenals/Urinary Tract: A 1.5 cm lipid rich adrenal  adenoma in the right is unchanged and benign. The left adrenal gland appears normal. Kidneys are normal, without renal calculi, focal lesion, or hydronephrosis. Bladder is unremarkable. Stomach/Bowel: Stomach is within normal limits. Appendix appears normal. There is colonic diverticulosis without evidence of  diverticulitis. No evidence of bowel wall thickening, distention, or inflammatory changes. Vascular/Lymphatic: Aortic atherosclerosis. No enlarged abdominal or pelvic lymph nodes. Reproductive: Status post hysterectomy. No adnexal masses. Other: There is moderate volume free intraperitoneal fluid. No abdominal wall hernia is identified. Musculoskeletal: Degenerative changes are seen in the spine. IMPRESSION: 1. Gallbladder wall thickening with surrounding edema may represent acute cholecystitis. Right upper quadrant ultrasound could be performed if the clinically indicated. 2. Moderate volume free intraperitoneal fluid. 3. Small bilateral pleural effusions with associated atelectasis. Aortic Atherosclerosis (ICD10-I70.0). Electronically Signed   By: Zerita Boers M.D.   On: 09/07/2020 10:37   DG Chest Portable 1 View  Result Date: 09/07/2020 CLINICAL DATA:  Pt states she has been having BP issues onset for several days. Pt denies sob, cp, fever, or N/V. No COVID test ordered at this time. HX of hypertension. EXAM: PORTABLE CHEST 1 VIEW COMPARISON:  10/26/2017.  CT, 11/23/2017. FINDINGS: Heart mildly enlarged.  No mediastinal or hilar masses. Lungs demonstrate vascular prominence. There is interstitial thickening and linear areas of scarring or atelectasis at the lung bases, greater on the left. Additional left lung base opacity obscures hemidiaphragm, which may reflect a pleural effusion in addition to the lung base opacity. No convincing pulmonary edema. No pneumothorax. Skeletal structures are grossly intact. IMPRESSION: 1. Left lung base opacity that is likely a combination of a small effusion and atelectasis. Consider pneumonia if there are consistent clinical findings. 2. Vascular congestion and mild lower lung interstitial thickening without convincing pulmonary edema. Electronically Signed   By: Lajean Manes M.D.   On: 09/07/2020 09:37   ECHOCARDIOGRAM COMPLETE  Result Date: 09/09/2020     ECHOCARDIOGRAM REPORT   Patient Name:   Norma Barajas Date of Exam: 09/09/2020 Medical Rec #:  [id number]   Height:       61.0 in Accession #:    [id number]  Weight:       120.0 lb Date of Birth:  [birth year]   BSA:          1.520 m Patient Age:    25 years    BP:           145/112 mmHg Patient Gender: F           HR:           100 bpm. Exam Location:  [facility] Procedure: 2D Echo, Cardiac Doppler and Color Doppler Indications:    Congestive Heart Failure 428.0 / I50.9  History:        Patient has prior history of Echocardiogram examinations, most                 recent 04/11/2015. Arrythmias:Atrial Fibrillation,                 Signs/Symptoms:Murmur; Risk Factors:Hypertension and                 Dyslipidemia. Chronic anticoagulation.  Sonographer:    Alvino Chapel RCS Referring Phys: [place]  1. Left ventricular ejection fraction, by estimation, is 50 to 55%. The left ventricle has low normal function. The left ventricle has no regional wall motion abnormalities. Left ventricular diastolic parameters are indeterminate.  2. Ventricular septum is flattened in systole  and diastole suggesting RV pressure and volume overload. . Right ventricular systolic function is moderately reduced. The right ventricular size is moderately enlarged.  3. Left atrial size was severely dilated.  4. Right atrial size was severely dilated.  5. The mitral valve is normal in structure. Mild to moderate mitral valve regurgitation. No evidence of mitral stenosis.  6. The tricuspid valve is abnormal. Tricuspid valve regurgitation is severe.  7. The aortic valve is tricuspid. Aortic valve regurgitation is mild.  8. The inferior vena cava is dilated in size with <50% respiratory variability, suggesting right atrial pressure of 15 mmHg. FINDINGS  Left Ventricle: Left ventricular ejection fraction, by estimation, is 50 to 55%. The left ventricle has low normal function. The left ventricle has no regional wall motion  abnormalities. The left ventricular internal cavity size was normal in size. There is no left ventricular hypertrophy. Left ventricular diastolic parameters are indeterminate. Right Ventricle: Ventricular septum is flattened in systole and diastole suggesting RV pressure and volume overload. The right ventricular size is moderately enlarged. Right vetricular wall thickness was not assessed. Right ventricular systolic function is moderately reduced. Left Atrium: Left atrial size was severely dilated. Right Atrium: Right atrial size was severely dilated. Pericardium: There is no evidence of pericardial effusion. Mitral Valve: The mitral valve is normal in structure. Mild to moderate mitral valve regurgitation. No evidence of mitral valve stenosis. Tricuspid Valve: The tricuspid valve is abnormal. Tricuspid valve regurgitation is severe. No evidence of tricuspid stenosis. Aortic Valve: The aortic valve is tricuspid. Aortic valve regurgitation is mild. Aortic regurgitation PHT measures 450 msec. Aortic valve mean gradient measures 2.6 mmHg. Aortic valve peak gradient measures 4.8 mmHg. Aortic valve area, by VTI measures 1.38 cm. Pulmonic Valve: The pulmonic valve was not well visualized. Pulmonic valve regurgitation is not visualized. No evidence of pulmonic stenosis. Aorta: The aortic root is normal in size and structure. Pulmonary Artery: Mild pulmonary HTN, PASP is 35 mmHg. Venous: The inferior vena cava is dilated in size with less than 50% respiratory variability, suggesting right atrial pressure of 15 mmHg. IAS/Shunts: The interatrial septum was not well visualized.  LEFT VENTRICLE PLAX 2D LVIDd:         3.73 cm LVIDs:         2.80 cm LV PW:         0.81 cm LV IVS:        0.87 cm LVOT diam:     1.60 cm LV SV:         25 LV SV Index:   16 LVOT Area:     2.01 cm  LV Volumes (MOD) LV vol d, MOD A2C: 44.7 ml LV vol d, MOD A4C: 39.9 ml LV vol s, MOD A2C: 26.7 ml LV vol s, MOD A4C: 18.1 ml LV SV MOD A2C:     18.0 ml  LV SV MOD A4C:     39.9 ml LV SV MOD BP:      19.1 ml RIGHT VENTRICLE RV S prime:     8.00 cm/s TAPSE (M-mode): 1.4 cm LEFT ATRIUM             Index       RIGHT ATRIUM           Index LA diam:        3.80 cm 2.50 cm/m  RA Area:     23.40 cm LA Vol (A2C):   48.7 ml 32.04 ml/m RA Volume:   74.60 ml  49.07  ml/m LA Vol (A4C):   70.8 ml 46.57 ml/m LA Biplane Vol: 59.9 ml 39.40 ml/m  AORTIC VALVE AV Area (Vmax):    1.40 cm AV Area (Vmean):   1.32 cm AV Area (VTI):     1.38 cm AV Vmax:           109.68 cm/s AV Vmean:          76.432 cm/s AV VTI:            0.178 m AV Peak Grad:      4.8 mmHg AV Mean Grad:      2.6 mmHg LVOT Vmax:         76.30 cm/s LVOT Vmean:        50.350 cm/s LVOT VTI:          0.122 m LVOT/AV VTI ratio: 0.69 AI PHT:            450 msec  AORTA Ao Root diam: 3.10 cm MITRAL VALVE                 TRICUSPID VALVE MV Area (PHT): 5.16 cm      TR Peak grad:   19.9 mmHg MV Decel Time: 147 msec      TR Vmax:        223.00 cm/s MR Peak grad:    117.1 mmHg MR Mean grad:    72.0 mmHg   SHUNTS MR Vmax:         541.00 cm/s Systemic VTI:  0.12 m MR Vmean:        392.0 cm/s  Systemic Diam: 1.60 cm MR PISA:         0.25 cm MR PISA Eff ROA: 1 mm MR PISA Radius:  0.20 cm MV E velocity: 86.30 cm/s Carlyle Dolly MD Electronically signed by Carlyle Dolly MD Signature Date/Time: 09/09/2020/1:13:32 PM    Final    US Abdomen Limited RUQ (LIVER/GB)  Result Date: 09/08/2020 CLINICAL DATA:  Right upper quadrant pain. EXAM: ULTRASOUND ABDOMEN LIMITED RIGHT UPPER QUADRANT COMPARISON:  CT abdomen pelvis 09/07/2020 FINDINGS: Gallbladder: Gallbladder wall thickness upper limits of normal. Trace pericholecystic fluid. Negative sonographic Murphy sign. Possible 4 mm gallbladder polyp. Common bile duct: Diameter: 5.7 mm. Liver: No focal lesion identified. Within normal limits in parenchymal echogenicity. Questioned mass adjacent to the liver likely represents small adenoma demonstrated on prior CT. Portal vein is patent  on color Doppler imaging with normal direction of blood flow towards the liver. Other: Small right pleural effusion. Enlarged IVC and hepatic veins. IMPRESSION: 1. No cholelithiasis. Gallbladder wall thickness upper limits of normal with trace pericholecystic fluid. Negative sonographic Murphy sign. 2. Enlarged IVC and hepatic veins suggesting some component of right heart failure. Electronically Signed   By: Lovey Newcomer M.D.   On: 09/08/2020 10:16  [2 weeks]   Assessment: 79 year old female with chronic A. fib on anticoagulant addition presenting to the ER on October 31 for abdominal pain and her work-up noted to have gallbladder wall thickening concerning for cholecystitis.  CT also revealed right pleural effusion, ascites, hepatic congestion and expanded IVC.  Surgery was consulted, ruled out cholecystitis.  GI was consulted for dysphagia.  Dysphagia: Patient complains of dysphagia to solids and pills.  Occurs frequently with meat, salads.  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 [name]. She has chronic GERD and reports heartburn well controlled on medication.  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  Abdominal pain: Possibly due to hepatic congestion which appears to be significant, due to right heart failure. No h/o chronic liver disease.    Plan: 1. Follow-up barium pill esophagram results. 2. Cardiology consult to optimize therapy for right-sided heart failure.  Laureen Ochs. Bernarda Caffey [facility] [phone] 11/4/20218:42 AM     LOS: 4 days

## 2020-09-11 NOTE — Progress Notes (Signed)
ANTICOAGULATION CONSULT NOTE -  Pharmacy Consult for heparin gtt  Indication: atrial fibrillation  Patient Measurements: Height: 5\' 1"  (154.9 cm) Weight: 51.8 kg (114 lb 4.8 oz) IBW/kg (Calculated) : 47.8 HEPARIN DW (KG): 54.4   Vital Signs: Temp: 98.5 F (36.9 C) (11/04 2016) Temp Source: Oral (11/04 1300) BP: 102/74 (11/04 2016) Pulse Rate: 118 (11/04 2016)  Labs: Recent Labs    09/09/20 0553 09/09/20 0553 09/10/20 0442 09/11/20 0439 09/11/20 1808  HGB 15.1*   < > 15.8* 15.5*  --   HCT 47.4*  --  48.6* 49.0*  --   PLT 177  --  184 184  --   LABPROT 29.3*  --  26.4* 19.9*  --   INR 2.9*  --  2.5* 1.8*  --   HEPARINUNFRC  --   --   --   --  <0.10*  CREATININE 0.97  --  0.98 1.06*  --    < > = values in this interval not displayed.    Estimated Creatinine Clearance: 31.4 mL/min (A) (by C-G formula based on SCr of 1.06 mg/dL (H)).   Assessment: Norma Barajas a 79 y.o. female requires anticoagulation with a heparin iv infusion for the indication of  atrial fibrillation. Heparin gtt will be started following pharmacy protocol per pharmacy consult. Patient is on warfarin prior to admission - admit INR 2.9 therapeutic   09/11/20 0900 UPDATE INR : 1.8 CBC: Hb 15.5  Plates 177>184>184  11/4@2100  update HL <0.10, subtherapeutic. Verified with RN 54/[id number]$ that IV was running at correct rate with good access.   Goal of Therapy:  Heparin level 0.3-0.7 units/ml Monitor platelets by anticoagulation protocol: Yes   Plan:  Heparin iv bolus of 1000 units Increase Heparin gtt IV to 950 units/hr Check heparin level in 8 hrs Daily heparin level and CBC  ordered Monitor for signs and symptoms of bleeding   Alyse Low, PharmD, MBA, BCGP Clinical Pharmacist  09/11/2020 9:11 PM

## 2020-09-11 NOTE — Telephone Encounter (Signed)
Please read Leslie's note- patient will need appointment with NUR.

## 2020-09-11 NOTE — Telephone Encounter (Signed)
Patient will need follow up office visit in the next few weeks with Dr. Laural Golden for dysphagia, RUQ pain.

## 2020-09-11 NOTE — Progress Notes (Signed)
Appreciate Dr. Olevia Perches input, will sign off for now.  Please call us if we can be of further assistance.

## 2020-09-11 NOTE — Progress Notes (Signed)
PROGRESS NOTE  Norma Barajas XEN:[id number] DOB: [birth year] DOA: 09/07/2020 PCP: Loman Brooklyn, FNP  Brief History:  Norma Barajas a 79 y.o.femalewith medical history significant ofhypertension, hyperlipidemia, atrial fibrillation on anticoagulation, presents to the emergency room with complaints of intermittent abdominal pain for the past 2 months. Symptoms have progressively gotten worse, particularly in the last 24 hours. Pain is described in the right upper quadrant, epigastrium radiating to her right upper back. Symptoms are worse with p.o. intake. She has had nausea, but no vomiting. Bowel movements have otherwise been normal. She is not had any fever. She also does describe progressive shortness of breath over the last few months. She reports is easier to breathe when she lays down flat, dyspnea is worse on exertion.  ED Course:She is noted to be tachycardic in atrial fibrillation. Blood pressure is elevated. WBC count is normal. Transaminases also are not elevated, although bilirubin is mildly elevated. Lactic acid also elevated. CT of the abdomen and pelvis indicated possible gallbladder wall thickening and pericholecystic fluid. She has been referred for admission.  Assessment/Plan: Abdominal pain -Initially, concerns for acalculous cholecystitis based on CT findings -Ultrasound does not show any evidence of stones, negative Murphy sign -She is afebrile, has no leukocytosis and LFTs are otherwise unremarkable -She was initially started on IV antibiotics -Seen by general surgery and it is felt that cholecystitis at this point is less likely -Antibiotics have been discontinued -Tolerating liquid diet, advanced to solid food, monitor for tolerance -11/3--states abd pain is not much better, worse with food--discussed with general surgery--Dr. Jones Skene HIDA scan, consult GI -suspect this pain is due to chronic hepatic congestion  Dysphagia -consult  GI-[facility] -discussed with Dr. Marin Olp esophagram -11/4--esophagram--tiny cervical esophageal web, diffuse esophageal dysmotility, Laryngeal penetration without aspiration -consult speech  Atrial fibrillation, chronic -INR is currently therapeutic -Pharmacy to dose coumadin -Continue metoprolol for rate control as well as amiodarone -increase metoprolol dose due to elevated HR  Acutediastolic CHF--R-sided -remains clinically fluid overloaded -Echo EF 50-55%, no WMA, RV volume overload, mild-mod MR, severe TR -She received a dose of lasix on 11/1 -continue IV lasix-->bid on 11/4 -consult cardiology--appreciated  Generalized weakness -physical therapy evaluation  Hypertension -Blood pressure currently elevated -Continue amlodipine and metoprolol  Hyperlipidemia -Hold statin for now  Hypokalemia -Replace -check mag--1.9        Status is: Inpatient  Remains inpatient appropriate because:IV treatments appropriate due to intensity of illness or inability to take PO   Dispo: The patient is from: Home  Anticipated d/c is to: Home  Anticipated d/c date is: 1 day  Patient currently is not medically stable to d/c.        Family Communication:   Spouse updated at bedside 11/3  Consultants:  General surgery; GI  Code Status:  DNR  DVT Prophylaxis:  warfarin   Procedures: As Listed in Progress Note Above  Antibiotics: None    Subjective:   Objective: Vitals:   09/10/20 2049 09/11/20 0510 09/11/20 0900 09/11/20 1231  BP: (!) 149/104 (!) 136/106 (!) 122/101 109/81  Pulse: (!) 110 (!) 105 98 (!) 112  Resp: 17 18 (!) 24 20  Temp: 98.1 F (36.7 C) 98.5 F (36.9 C) 97.7 F (36.5 C) 98.1 F (36.7 C)  TempSrc: Oral Oral Oral Oral  SpO2: 95% 94% 95% 93%  Weight:  51.8 kg    Height:        Intake/Output Summary (Last 24 hours) at  09/11/2020 1310 Last data filed at 09/11/2020  1248 Gross per 24 hour  Intake 670 ml  Output 1150 ml  Net -480 ml   Weight change:  Exam:   General:  Pt is alert, follows commands appropriately, not in acute distress  HEENT: No icterus, No thrush, No neck mass, [place]/AT  Cardiovascular: RRR, S1/S2, no rubs, no gallops  Respiratory: CTA bilaterally, no wheezing, no crackles, no rhonchi  Abdomen: Soft/+BS, non tender, non distended, no guarding  Extremities: No edema, No lymphangitis, No petechiae, No rashes, no synovitis   Data Reviewed: I have personally reviewed following labs and imaging studies Basic Metabolic Panel: Recent Labs  Lab 09/07/20 0914 09/07/20 1038 09/08/20 0424 09/09/20 0553 09/10/20 0442 09/11/20 0439  NA 138  --  138 139 139 138  K 3.0*  --  4.2 3.2* 3.1* 3.1*  CL 98  --  101 98 95* 95*  CO2 27  --  24 28 29 30   GLUCOSE 162*  --  72 85 112* 107*  BUN 16  --  13 14 13 15   CREATININE 1.09*  --  0.86 0.97 0.98 1.06*  CALCIUM 8.6*  --  8.2* 8.7* 8.7* 8.4*  MG  --  1.7  --   --   --  1.9   Liver Function Tests: Recent Labs  Lab 09/07/20 0914 09/08/20 0424 09/09/20 0553 09/11/20 0439  AST 29 26 28  35  ALT 19 18 18 20   ALKPHOS 66 52 56 63  BILITOT 1.3* 1.0 1.3* 1.0  PROT 6.6 5.7* 6.2* 6.4*  ALBUMIN 3.6 3.1* 3.4* 3.5   Recent Labs  Lab 09/07/20 0914  LIPASE 24   No results for input(s): AMMONIA in the last 168 hours. Coagulation Profile: Recent Labs  Lab 09/07/20 0916 09/08/20 0424 09/09/20 0553 09/10/20 0442 09/11/20 0439  INR 2.9* 3.1* 2.9* 2.5* 1.8*   CBC: Recent Labs  Lab 09/07/20 0914 09/08/20 0424 09/09/20 0553 09/10/20 0442 09/11/20 0439  WBC 6.1 4.8 6.1 5.8 5.9  NEUTROABS 4.5  --   --   --   --   HGB 16.1* 14.8 15.1* 15.8* 15.5*  HCT 50.1* 46.2* 47.4* 48.6* 49.0*  MCV 95.4 96.3 96.3 94.7 97.0  PLT 183 173 177 184 184   Cardiac Enzymes: No results for input(s): CKTOTAL, CKMB, CKMBINDEX, TROPONINI in the last 168 hours. BNP: Invalid input(s):  POCBNP CBG: No results for input(s): GLUCAP in the last 168 hours. HbA1C: No results for input(s): HGBA1C in the last 72 hours. Urine analysis:    Component Value Date/Time   COLORURINE STRAW (A) 09/10/2020 1113   APPEARANCEUR CLEAR 09/10/2020 1113   APPEARANCEUR Clear 07/31/2020 1545   LABSPEC 1.004 (L) 09/10/2020 1113   PHURINE 6.0 09/10/2020 1113   GLUCOSEU NEGATIVE 09/10/2020 1113   HGBUR NEGATIVE 09/10/2020 1113   BILIRUBINUR NEGATIVE 09/10/2020 1113   BILIRUBINUR Negative 07/31/2020 1545   KETONESUR NEGATIVE 09/10/2020 1113   PROTEINUR NEGATIVE 09/10/2020 1113   UROBILINOGEN negative 02/06/2015 1252   NITRITE NEGATIVE 09/10/2020 1113   LEUKOCYTESUR NEGATIVE 09/10/2020 1113   Sepsis Labs: @LABRCNTIP (procalcitonin:4,lacticidven:4) ) Recent Results (from the past 240 hour(s))  Respiratory Panel by RT PCR (Flu A&B, Covid) - Nasopharyngeal Swab     Status: None   Collection Time: 09/07/20 10:04 AM   Specimen: Nasopharyngeal Swab  Result Value Ref Range Status   SARS Coronavirus 2 by RT PCR NEGATIVE NEGATIVE Final    Comment: (NOTE) SARS-CoV-2 target nucleic acids are NOT DETECTED.  The  SARS-CoV-2 RNA is generally detectable in upper respiratoy specimens during the acute phase of infection. The lowest concentration of SARS-CoV-2 viral copies this assay can detect is 131 copies/mL. A negative result does not preclude SARS-Cov-2 infection and should not be used as the sole basis for treatment or other patient management decisions. A negative result may occur with  improper specimen collection/handling, submission of specimen other than nasopharyngeal swab, presence of viral mutation(s) within the areas targeted by this assay, and inadequate number of viral copies (<131 copies/mL). A negative result must be combined with clinical observations, patient history, and epidemiological information. The expected result is Negative.  Fact Sheet for Patients:   PinkCheek.be  Fact Sheet for Healthcare Providers:  GravelBags.it  This test is no t yet approved or cleared by the [place] FDA and  has been authorized for detection and/or diagnosis of SARS-CoV-2 by FDA under an Emergency Use Authorization (EUA). This EUA will remain  in effect (meaning this test can be used) for the duration of the COVID-19 declaration under Section 564(b)(1) of the Act, 21 U.S.C. section 360bbb-3(b)(1), unless the authorization is terminated or revoked sooner.     Influenza A by PCR NEGATIVE NEGATIVE Final   Influenza B by PCR NEGATIVE NEGATIVE Final    Comment: (NOTE) The Xpert Xpress SARS-CoV-2/FLU/RSV assay is intended as an aid in  the diagnosis of influenza from Nasopharyngeal swab specimens and  should not be used as a sole basis for treatment. Nasal washings and  aspirates are unacceptable for Xpert Xpress SARS-CoV-2/FLU/RSV  testing.  Fact Sheet for Patients: PinkCheek.be  Fact Sheet for Healthcare Providers: GravelBags.it  This test is not yet approved or cleared by the [place] FDA and  has been authorized for detection and/or diagnosis of SARS-CoV-2 by  FDA under an Emergency Use Authorization (EUA). This EUA will remain  in effect (meaning this test can be used) for the duration of the  Covid-19 declaration under Section 564(b)(1) of the Act, 21  U.S.C. section 360bbb-3(b)(1), unless the authorization is  terminated or revoked. Performed at [facility], [address], [place], [place] [address]   Culture, Urine     Status: None   Collection Time: 09/10/20 11:13 AM   Specimen: Urine, Clean Catch  Result Value Ref Range Status   Specimen Description   Final    URINE, CLEAN CATCH Performed at [facility], [address] [place], [place] [address]    Special Requests   Final    NONE Performed at [facility], [address], [place], [place] [address]    Culture   Final    NO GROWTH Performed at [facility], [place] [address] [place], [place] [address]    Report Status 09/11/2020 FINAL  Final     Scheduled Meds: . amiodarone  200 mg Oral Daily  . amLODipine  10 mg Oral Daily  . feeding supplement  1 Container Oral BID BM  . furosemide  40 mg Intravenous Daily  . furosemide  40 mg Intravenous Once  . metoprolol tartrate  75 mg Oral BID  . multivitamin with minerals  1 tablet Oral Daily  . pantoprazole  40 mg Oral Daily  . potassium chloride  40 mEq Oral Daily  . potassium chloride  40 mEq Oral Once  . saccharomyces boulardii  250 mg Oral BID   Continuous Infusions: . sodium chloride 500 mL (09/07/20 1833)  . heparin 750 Units/hr (09/11/20 1047)    Procedures/Studies: DG Lumbar  Spine 2-3 Views  Result Date: 08/18/2020 CLINICAL DATA:  79 year old female with low back pain. EXAM: LUMBAR SPINE - 2-3 VIEW COMPARISON:  Lumbar spine radiograph dated 07/31/2015. FINDINGS: There is no acute fracture or subluxation of the lumbar spine. There is osteopenia with degenerative changes and lumbar levoscoliosis. Multilevel facet arthropathy. There is atherosclerotic calcification of the aorta. The soft tissues are unremarkable. IMPRESSION: No acute fracture or subluxation of the lumbar spine. Electronically Signed   By: Anner Crete M.D.   On: 08/18/2020 22:16   CT ABDOMEN PELVIS W CONTRAST  Result Date: 09/07/2020 CLINICAL DATA:  Abdominal pain, lower back pain for 3 months. EXAM: CT ABDOMEN AND PELVIS WITH CONTRAST TECHNIQUE: Multidetector CT imaging of the abdomen and pelvis was performed using the standard protocol following bolus administration of intravenous contrast. CONTRAST:  50mL OMNIPAQUE IOHEXOL 300 MG/ML  SOLN COMPARISON:  CT abdomen pelvis dated 07/10/2019 and other prior exams. FINDINGS: Lower chest: There are small bilateral pleural effusions with associated atelectasis.  Hepatobiliary: Two hepatic cysts are redemonstrated. There is a gallbladder wall thickening with surrounding edema. No definite gallstones are identified. No biliary dilatation. Pancreas: A 6 mm low attenuating mass in the pancreatic tail is not significantly changed since at least 11/28/2012 and presumed benign. No pancreatic ductal dilatation or surrounding inflammatory changes. Spleen: Normal in size without focal abnormality. Adrenals/Urinary Tract: A 1.5 cm lipid rich adrenal adenoma in the right is unchanged and benign. The left adrenal gland appears normal. Kidneys are normal, without renal calculi, focal lesion, or hydronephrosis. Bladder is unremarkable. Stomach/Bowel: Stomach is within normal limits. Appendix appears normal. There is colonic diverticulosis without evidence of diverticulitis. No evidence of bowel wall thickening, distention, or inflammatory changes. Vascular/Lymphatic: Aortic atherosclerosis. No enlarged abdominal or pelvic lymph nodes. Reproductive: Status post hysterectomy. No adnexal masses. Other: There is moderate volume free intraperitoneal fluid. No abdominal wall hernia is identified. Musculoskeletal: Degenerative changes are seen in the spine. IMPRESSION: 1. Gallbladder wall thickening with surrounding edema may represent acute cholecystitis. Right upper quadrant ultrasound could be performed if the clinically indicated. 2. Moderate volume free intraperitoneal fluid. 3. Small bilateral pleural effusions with associated atelectasis. Aortic Atherosclerosis (ICD10-I70.0). Electronically Signed   By: Zerita Boers M.D.   On: 09/07/2020 10:37   DG Chest Portable 1 View  Result Date: 09/07/2020 CLINICAL DATA:  Pt states she has been having BP issues onset for several days. Pt denies sob, cp, fever, or N/V. No COVID test ordered at this time. HX of hypertension. EXAM: PORTABLE CHEST 1 VIEW COMPARISON:  10/26/2017.  CT, 11/23/2017. FINDINGS: Heart mildly enlarged.  No mediastinal or  hilar masses. Lungs demonstrate vascular prominence. There is interstitial thickening and linear areas of scarring or atelectasis at the lung bases, greater on the left. Additional left lung base opacity obscures hemidiaphragm, which may reflect a pleural effusion in addition to the lung base opacity. No convincing pulmonary edema. No pneumothorax. Skeletal structures are grossly intact. IMPRESSION: 1. Left lung base opacity that is likely a combination of a small effusion and atelectasis. Consider pneumonia if there are consistent clinical findings. 2. Vascular congestion and mild lower lung interstitial thickening without convincing pulmonary edema. Electronically Signed   By: Lajean Manes M.D.   On: 09/07/2020 09:37   ECHOCARDIOGRAM COMPLETE  Result Date: 09/09/2020    ECHOCARDIOGRAM REPORT   Patient Name:   Norma Barajas Date of Exam: 09/09/2020 Medical Rec #:  [id number]   Height:       61.0 in  Accession #:    [id number]  Weight:       120.0 lb Date of Birth:  Apr 18, 1941   BSA:          1.520 m Patient Age:    44 years    BP:           145/112 mmHg Patient Gender: F           HR:           100 bpm. Exam Location:  [facility] Procedure: 2D Echo, Cardiac Doppler and Color Doppler Indications:    Congestive Heart Failure 428.0 / I50.9  History:        Patient has prior history of Echocardiogram examinations, most                 recent 04/11/2015. Arrythmias:Atrial Fibrillation,                 Signs/Symptoms:Murmur; Risk Factors:Hypertension and                 Dyslipidemia. Chronic anticoagulation.  Sonographer:    Alvino Chapel RCS Referring Phys: [place]  1. Left ventricular ejection fraction, by estimation, is 50 to 55%. The left ventricle has low normal function. The left ventricle has no regional wall motion abnormalities. Left ventricular diastolic parameters are indeterminate.  2. Ventricular septum is flattened in systole and diastole suggesting RV pressure and volume overload. .  Right ventricular systolic function is moderately reduced. The right ventricular size is moderately enlarged.  3. Left atrial size was severely dilated.  4. Right atrial size was severely dilated.  5. The mitral valve is normal in structure. Mild to moderate mitral valve regurgitation. No evidence of mitral stenosis.  6. The tricuspid valve is abnormal. Tricuspid valve regurgitation is severe.  7. The aortic valve is tricuspid. Aortic valve regurgitation is mild.  8. The inferior vena cava is dilated in size with <50% respiratory variability, suggesting right atrial pressure of 15 mmHg. FINDINGS  Left Ventricle: Left ventricular ejection fraction, by estimation, is 50 to 55%. The left ventricle has low normal function. The left ventricle has no regional wall motion abnormalities. The left ventricular internal cavity size was normal in size. There is no left ventricular hypertrophy. Left ventricular diastolic parameters are indeterminate. Right Ventricle: Ventricular septum is flattened in systole and diastole suggesting RV pressure and volume overload. The right ventricular size is moderately enlarged. Right vetricular wall thickness was not assessed. Right ventricular systolic function is moderately reduced. Left Atrium: Left atrial size was severely dilated. Right Atrium: Right atrial size was severely dilated. Pericardium: There is no evidence of pericardial effusion. Mitral Valve: The mitral valve is normal in structure. Mild to moderate mitral valve regurgitation. No evidence of mitral valve stenosis. Tricuspid Valve: The tricuspid valve is abnormal. Tricuspid valve regurgitation is severe. No evidence of tricuspid stenosis. Aortic Valve: The aortic valve is tricuspid. Aortic valve regurgitation is mild. Aortic regurgitation PHT measures 450 msec. Aortic valve mean gradient measures 2.6 mmHg. Aortic valve peak gradient measures 4.8 mmHg. Aortic valve area, by VTI measures 1.38 cm. Pulmonic Valve: The pulmonic  valve was not well visualized. Pulmonic valve regurgitation is not visualized. No evidence of pulmonic stenosis. Aorta: The aortic root is normal in size and structure. Pulmonary Artery: Mild pulmonary HTN, PASP is 35 mmHg. Venous: The inferior vena cava is dilated in size with less than 50% respiratory variability, suggesting right atrial pressure of 15 mmHg. IAS/Shunts: The interatrial  septum was not well visualized.  LEFT VENTRICLE PLAX 2D LVIDd:         3.73 cm LVIDs:         2.80 cm LV PW:         0.81 cm LV IVS:        0.87 cm LVOT diam:     1.60 cm LV SV:         25 LV SV Index:   16 LVOT Area:     2.01 cm  LV Volumes (MOD) LV vol d, MOD A2C: 44.7 ml LV vol d, MOD A4C: 39.9 ml LV vol s, MOD A2C: 26.7 ml LV vol s, MOD A4C: 18.1 ml LV SV MOD A2C:     18.0 ml LV SV MOD A4C:     39.9 ml LV SV MOD BP:      19.1 ml RIGHT VENTRICLE RV S prime:     8.00 cm/s TAPSE (M-mode): 1.4 cm LEFT ATRIUM             Index       RIGHT ATRIUM           Index LA diam:        3.80 cm 2.50 cm/m  RA Area:     23.40 cm LA Vol (A2C):   48.7 ml 32.04 ml/m RA Volume:   74.60 ml  49.07 ml/m LA Vol (A4C):   70.8 ml 46.57 ml/m LA Biplane Vol: 59.9 ml 39.40 ml/m  AORTIC VALVE AV Area (Vmax):    1.40 cm AV Area (Vmean):   1.32 cm AV Area (VTI):     1.38 cm AV Vmax:           109.68 cm/s AV Vmean:          76.432 cm/s AV VTI:            0.178 m AV Peak Grad:      4.8 mmHg AV Mean Grad:      2.6 mmHg LVOT Vmax:         76.30 cm/s LVOT Vmean:        50.350 cm/s LVOT VTI:          0.122 m LVOT/AV VTI ratio: 0.69 AI PHT:            450 msec  AORTA Ao Root diam: 3.10 cm MITRAL VALVE                 TRICUSPID VALVE MV Area (PHT): 5.16 cm      TR Peak grad:   19.9 mmHg MV Decel Time: 147 msec      TR Vmax:        223.00 cm/s MR Peak grad:    117.1 mmHg MR Mean grad:    72.0 mmHg   SHUNTS MR Vmax:         541.00 cm/s Systemic VTI:  0.12 m MR Vmean:        392.0 cm/s  Systemic Diam: 1.60 cm MR PISA:         0.25 cm MR PISA Eff ROA: 1 mm MR  PISA Radius:  0.20 cm MV E velocity: 86.30 cm/s Carlyle Dolly MD Electronically signed by Carlyle Dolly MD Signature Date/Time: 09/09/2020/1:13:32 PM    Final    US Abdomen Limited RUQ (LIVER/GB)  Result Date: 09/08/2020 CLINICAL DATA:  Right upper quadrant pain. EXAM: ULTRASOUND ABDOMEN LIMITED RIGHT UPPER QUADRANT COMPARISON:  CT abdomen pelvis 09/07/2020 FINDINGS: Gallbladder: Gallbladder wall  thickness upper limits of normal. Trace pericholecystic fluid. Negative sonographic Murphy sign. Possible 4 mm gallbladder polyp. Common bile duct: Diameter: 5.7 mm. Liver: No focal lesion identified. Within normal limits in parenchymal echogenicity. Questioned mass adjacent to the liver likely represents small adenoma demonstrated on prior CT. Portal vein is patent on color Doppler imaging with normal direction of blood flow towards the liver. Other: Small right pleural effusion. Enlarged IVC and hepatic veins. IMPRESSION: 1. No cholelithiasis. Gallbladder wall thickness upper limits of normal with trace pericholecystic fluid. Negative sonographic Murphy sign. 2. Enlarged IVC and hepatic veins suggesting some component of right heart failure. Electronically Signed   By: Lovey Newcomer M.D.   On: 09/08/2020 10:16   DG ESOPHAGUS W SINGLE CM (SOL OR THIN BA)  Result Date: 09/11/2020 CLINICAL DATA:  Dysphagia for liquids, solids and pills, remote esophageal dilatation 2008 EXAM: ESOPHOGRAM/BARIUM SWALLOW TECHNIQUE: Single contrast examination was performed using thin barium and a 12.5 mm diameter barium tablet. FLUOROSCOPY TIME:  Fluoroscopy Time:  1 minutes 36 seconds Radiation Exposure Index (if provided by the fluoroscopic device): 20.7 mGy Number of Acquired Spot Images: multiple fluoroscopic screen captures COMPARISON:  None FINDINGS: Esophageal distention: Normal without mass or stricture. 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 Filling defects: Tiny anterior wall cervical esophageal web, nonobstructive. 12.5 mm  barium tablet: Passed from oral cavity to stomach without obstruction Motility: Moderate diffuse esophageal dysmotility with incomplete clearance of barium by primary peristaltic waves. Weak secondary and scattered tertiary waves were seen. Prolonged thoracic esophageal retention of contrast. Mucosa:  Smooth without irregularity or ulceration Hypopharynx/cervical esophagus: Laryngeal penetration without witnessed aspiration. However, when the patient was drinking water to swallow the 12.5 mm diameter barium tablet, she experience multiple episodes of spontaneous coughing, suspect unwitnessed aspiration. 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 Laryngeal penetration without aspiration though I suspect patient had multiple episodes of unwitnessed aspiration when swallowing the 12.5 mm diameter barium tablet with water. Electronically Signed   By: Lavonia Dana M.D.   On: 09/11/2020 09:49    Orson Eva, DO  [facility]  If 7PM-7AM, please contact night-coverage [email or web address] Password [email or web address] 09/11/2020, 1:10 PM   LOS: 4 days

## 2020-09-11 NOTE — Consult Note (Signed)
Cardiology Consultation:   Patient ID: Norma Barajas MRN: [id number]; DOB: [birth year]  Admit date: 09/07/2020 Date of Consult: 09/11/2020  Primary Care Provider: Loman Brooklyn, FNP [facility] Cardiologist: Minus Breeding, MD  [facility] Electrophysiologist:  None    Patient Profile:   Norma Barajas is a 79 y.o. female with a hx of afib who is being seen today for the evaluation of SOB at the request of Dr Tat.  History of Present Illness:   Norma Barajas 79 yo female history of afib, HTN, nonobstructive CAD by cath 2006, admitted with abdominal pain. During admission found to have evidence of RV dysfunction and fluid overload, severe TR. Cardiology consulted for evaluation.   Could not afford tikosyn in the past, changed to sotalol which later failed and changed to amiodarone.     Admit labs WBC 6.1 Hgb 16.1 Plt 183 K 3 Cr 1.09 BUN 16 Lactic acid 2.3 INR 2.9 Mg 1.7 BNP 546 Ddimer 1 hstrop 7-->14 CXR Left lung opacity, vascular congestion CT A/P: gallbladder thickening, modarete intraperitoneal fluid 09/2020 Echo LVEF 50-55%, no WMAs, indet DDx, ventricular septum flattened in systole and diastole, mod RV dysfunction, severe biatrial enlargement, mild to mod MR, severe TR, PASP is 35 mmHg    Past Medical History:  Diagnosis Date  . 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 Anxiety   . Arthritis    "right leg" (04/11/2015)  . BPPV (benign paroxysmal positional vertigo) 03/23/2016  . Cataract   . Coronary atherosclerosis of native coronary artery    a. Nonobstructive minimal CAD 10/2005.  Marland Kitchen Depression   . Diastolic dysfunction    Grade 1. Ejection fraction 60-65%.  Marland Kitchen Dysrhythmia    a fib  . Erosive esophagitis   . Essential hypertension   . GERD (gastroesophageal reflux disease)   . Grade III hemorrhoids   . History of hiatal hernia   . Hypercholesterolemia   . Internal hemorrhoids with complication 7/41/2878   OCT 2015 FLEX SIG/IH BANDING    . Migraine     "used to have them right bad; I don't now" (04/11/2015)  . MITRAL REGURGITATION 04/27/2010   Qualifier: Diagnosis of  By: [name], MD, [name]   . Osteoporosis   . Paroxysmal atrial fibrillation (HCC)   . PONV (postoperative nausea and vomiting)   . Vertigo     Past Surgical History:  Procedure Laterality Date  . BRAVO [place] STUDY  11/15/2012   Procedure: BRAVO [place];  Surgeon: [name], MD;  Location: [facility];  Service: Endoscopy;;  . CARDIOVERSION N/A 05/16/2020   Procedure: CARDIOVERSION;  Surgeon: Donato Heinz, MD;  Location: [facility] ENDOSCOPY;  Service: Cardiovascular;  Laterality: N/A;  . CATARACT EXTRACTION W/ INTRAOCULAR LENS  IMPLANT, BILATERAL Bilateral   . COLONOSCOPY  2008   Dr. [name]: internal hemorrhoids   . DILATION AND CURETTAGE OF UTERUS    . ESOPHAGOGASTRODUODENOSCOPY (EGD) WITH ESOPHAGEAL DILATION  2001   Dr. [name]: erosive esophagitis, esophageal stricture, duodenitis, s/p Savary dilation  . ESOPHAGOGASTRODUODENOSCOPY (EGD) WITH ESOPHAGEAL DILATION  11/15/2012   [id number] web was found & MOST LIKELY CAUSE FOR DYAPHAGIA/Polyp was found in the gastric body and gastric fundus/ gastritis on bx  . EYE SURGERY Bilateral    "laser OR after cataract OR; cause I couldn't see"  . FLEXIBLE SIGMOIDOSCOPY N/A 08/22/2014   mild diverticulosis in sigmoid, moderate sized Grade 3 hemorrhoids s/p banding X 3.   Marland Kitchen FRACTURE SURGERY Right  below the knee - 2 bones broke has plates  . HEMORRHOID BANDING N/A 08/22/2014   Procedure: HEMORRHOID BANDING;  Surgeon: [name], MD;  Location: [facility];  Service: Endoscopy;  Laterality: N/A;  . HEMORRHOID SURGERY N/A 03/30/2018   Procedure: EXTENSIVE HEMORRHOIDECTOMY;  Surgeon: [name], MD;  Location: [facility];  Service: General;  Laterality: N/A;  . OPEN REDUCTION INTERNAL FIXATION (ORIF) TIBIA/FIBULA FRACTURE Right 2013   broke tibia and fibula after falling down stairs  . PROLAPSED UTERINE  FIBROID LIGATION  2015  . TOTAL ABDOMINAL HYSTERECTOMY       Inpatient Medications: Scheduled Meds: . amiodarone  200 mg Oral Daily  . amLODipine  10 mg Oral Daily  . feeding supplement  1 Container Oral BID BM  . furosemide  40 mg Intravenous Daily  . metoprolol tartrate  50 mg Oral BID  . multivitamin with minerals  1 tablet Oral Daily  . pantoprazole  40 mg Oral Daily  . potassium chloride  40 mEq Oral Daily  . saccharomyces boulardii  250 mg Oral BID   Continuous Infusions: . sodium chloride 500 mL (09/07/20 1833)  . heparin     PRN Meds: sodium chloride, acetaminophen **OR** acetaminophen, fentaNYL (SUBLIMAZE) injection, metoprolol tartrate, ondansetron **OR** ondansetron (ZOFRAN) IV  Allergies:    Allergies  Allergen Reactions  . Codeine Nausea And Vomiting  . Doxycycline Other (See Comments)    Chest congestion  . Triamterene-Hctz Other (See Comments)    weakness  . Atorvastatin Other (See Comments)    Myalgias   . Crestor [Rosuvastatin Calcium] Other (See Comments)    weakness  . Morphine Nausea Only  . Risedronate Sodium Other (See Comments)    ACTONEL - reflux    Social History:   Social History   Socioeconomic History  . Marital status: Married    Spouse name: george   . Number of children: 1  . Years of education: Not on file  . Highest education level: Not on file  Occupational History  . Occupation: Retired    Comment: Textile  Tobacco Use  . Smoking status: Never Smoker  . Smokeless tobacco: Never Used  Vaping Use  . Vaping Use: Never used  Substance and Sexual Activity  . Alcohol use: No    Alcohol/week: 0.0 standard drinks  . Drug use: No  . Sexual activity: Not Currently    Birth control/protection: Surgical, Post-menopausal    Comment: hyst  Other Topics Concern  . Not on file  Social History Narrative   Married   No regular exercise   Social Determinants of Health   Financial Resource Strain:   . Difficulty of Paying Living  Expenses: Not on file  Food Insecurity:   . Worried About Charity fundraiser in the Last Year: Not on file  . Ran Out of Food in the Last Year: Not on file  Transportation Needs:   . Lack of Transportation (Medical): Not on file  . Lack of Transportation (Non-Medical): Not on file  Physical Activity:   . Days of Exercise per Week: Not on file  . Minutes of Exercise per Session: Not on file  Stress:   . Feeling of Stress : Not on file  Social Connections:   . Frequency of Communication with Friends and Family: Not on file  . Frequency of Social Gatherings with Friends and Family: Not on file  . Attends Religious Services: Not on file  . Active Member of Clubs or  Organizations: Not on file  . Attends Archivist Meetings: Not on file  . Marital Status: Not on file  Intimate Partner Violence:   . Fear of Current or Ex-Partner: Not on file  . Emotionally Abused: Not on file  . Physically Abused: Not on file  . Sexually Abused: Not on file    Family History:    Family History  Problem Relation Age of Onset  . Colon cancer Mother 53  . Heart disease Mother   . Osteoporosis Mother   . Hip fracture Mother   . Stroke Sister   . Diabetes Sister   . Osteoporosis Sister   . Arthritis Sister   . Uterine cancer Sister   . Stroke Sister   . Heart disease Father   . Hyperlipidemia Brother   . Hypertension Brother   . Heart disease Brother   . Stroke Brother   . Heart disease Sister   . Dementia Sister   . Diabetes Son   . Stroke Son   . Heart attack Neg Hx      ROS:  Please see the history of present illness.  All other ROS reviewed and negative.     Physical Exam/Data:   Vitals:   09/10/20 1105 09/10/20 2049 09/11/20 0510 09/11/20 0900  BP:  (!) 149/104 (!) 136/106 (!) 122/101  Pulse:  (!) 110 (!) 105 98  Resp:  17 18 (!) 24  Temp:  98.1 F (36.7 C) 98.5 F (36.9 C) 97.7 F (36.5 C)  TempSrc:  Oral Oral Oral  SpO2:  95% 94% 95%  Weight: 52 kg  51.8 kg     Height:        Intake/Output Summary (Last 24 hours) at 09/11/2020 0958 Last data filed at 09/11/2020 0800 Gross per 24 hour  Intake 620 ml  Output 1450 ml  Net -830 ml   Last 3 Weights 09/11/2020 09/10/2020 09/07/2020  Weight (lbs) 114 lb 4.8 oz 114 lb 10.2 oz 120 lb  Weight (kg) 51.846 kg 52 kg 54.432 kg     Body mass index is 21.6 kg/m.  General:  Well nourished, well developed, in no acute distress HEENT: normal Lymph: no adenopathy Neck: elevated JVD Endocrine:  No thryomegaly Vascular: No carotid bruits; FA pulses 2+ bilaterally without bruits  Cardiac:  Irreg, tachy, 2/6 systolic murmur LLSB Lungs:  clear to auscultation bilaterally, no wheezing, rhonchi or rales  Abd: soft, nontender, no hepatomegaly  Ext: trace bilateral edema Musculoskeletal:  No deformities, BUE and BLE strength normal and equal Skin: warm and dry  Neuro:  CNs 2-12 intact, no focal abnormalities noted Psych:  Normal affect     Laboratory Data:  High Sensitivity Troponin:   Recent Labs  Lab 09/07/20 0914 09/07/20 1038  TROPONINIHS 7 14     Chemistry Recent Labs  Lab 09/04/20 1000 09/07/20 0914 09/09/20 0553 09/10/20 0442 09/11/20 0439  NA 144   < > 139 139 138  K 3.5   < > 3.2* 3.1* 3.1*  CL 97   < > 98 95* 95*  CO2 30*   < > 28 29 30   GLUCOSE 89   < > 85 112* 107*  BUN 14   < > 14 13 15   CREATININE 1.15*   < > 0.97 0.98 1.06*  CALCIUM 8.7   < > 8.7* 8.7* 8.4*  GFRNONAA 45*   < > 59* 58* 53*  GFRAA 52*  --   --   --   --  ANIONGAP  --    < > 13 15 13    < > = values in this interval not displayed.    Recent Labs  Lab 09/08/20 0424 09/09/20 0553 09/11/20 0439  PROT 5.7* 6.2* 6.4*  ALBUMIN 3.1* 3.4* 3.5  AST 26 28 35  ALT 18 18 20   ALKPHOS 52 56 63  BILITOT 1.0 1.3* 1.0   Hematology Recent Labs  Lab 09/09/20 0553 09/10/20 0442 09/11/20 0439  WBC 6.1 5.8 5.9  RBC 4.92 5.13* 5.05  HGB 15.1* 15.8* 15.5*  HCT 47.4* 48.6* 49.0*  MCV 96.3 94.7 97.0  MCH 30.7 30.8  30.7  MCHC 31.9 32.5 31.6  RDW 19.3* 18.7* 19.1*  PLT 177 184 184   BNP Recent Labs  Lab 09/10/20 0442  BNP 546.0*    DDimer  Recent Labs  Lab 09/09/20 2102  DDIMER 1.06*     Radiology/Studies:  CT ABDOMEN PELVIS W CONTRAST  Result Date: 09/07/2020 CLINICAL DATA:  Abdominal pain, lower back pain for 3 months. EXAM: CT ABDOMEN AND PELVIS WITH CONTRAST TECHNIQUE: Multidetector CT imaging of the abdomen and pelvis was performed using the standard protocol following bolus administration of intravenous contrast. CONTRAST:  63mL OMNIPAQUE IOHEXOL 300 MG/ML  SOLN COMPARISON:  CT abdomen pelvis dated 07/10/2019 and other prior exams. FINDINGS: Lower chest: There are small bilateral pleural effusions with associated atelectasis. Hepatobiliary: Two hepatic cysts are redemonstrated. There is a gallbladder wall thickening with surrounding edema. No definite gallstones are identified. No biliary dilatation. Pancreas: A 6 mm low attenuating mass in the pancreatic tail is not significantly changed since at least 11/28/2012 and presumed benign. No pancreatic ductal dilatation or surrounding inflammatory changes. Spleen: Normal in size without focal abnormality. Adrenals/Urinary Tract: A 1.5 cm lipid rich adrenal adenoma in the right is unchanged and benign. The left adrenal gland appears normal. Kidneys are normal, without renal calculi, focal lesion, or hydronephrosis. Bladder is unremarkable. Stomach/Bowel: Stomach is within normal limits. Appendix appears normal. There is colonic diverticulosis without evidence of diverticulitis. No evidence of bowel wall thickening, distention, or inflammatory changes. Vascular/Lymphatic: Aortic atherosclerosis. No enlarged abdominal or pelvic lymph nodes. Reproductive: Status post hysterectomy. No adnexal masses. Other: There is moderate volume free intraperitoneal fluid. No abdominal wall hernia is identified. Musculoskeletal: Degenerative changes are seen in the spine.  IMPRESSION: 1. Gallbladder wall thickening with surrounding edema may represent acute cholecystitis. Right upper quadrant ultrasound could be performed if the clinically indicated. 2. Moderate volume free intraperitoneal fluid. 3. Small bilateral pleural effusions with associated atelectasis. Aortic Atherosclerosis (ICD10-I70.0). Electronically Signed   By: Zerita Boers M.D.   On: 09/07/2020 10:37   ECHOCARDIOGRAM COMPLETE  Result Date: 09/09/2020    ECHOCARDIOGRAM REPORT   Patient Name:   Norma Barajas Date of Exam: 09/09/2020 Medical Rec #:  [id number]   Height:       61.0 in Accession #:    [id number]  Weight:       120.0 lb Date of Birth:  17-Feb-1941   BSA:          1.520 m Patient Age:    46 years    BP:           145/112 mmHg Patient Gender: F           HR:           100 bpm. Exam Location:  [facility] Procedure: 2D Echo, Cardiac Doppler and Color Doppler Indications:    Congestive Heart Failure 428.0 /  I50.9  History:        Patient has prior history of Echocardiogram examinations, most                 recent 04/11/2015. Arrythmias:Atrial Fibrillation,                 Signs/Symptoms:Murmur; Risk Factors:Hypertension and                 Dyslipidemia. Chronic anticoagulation.  Sonographer:    Alvino Chapel RCS Referring Phys: [place]  1. Left ventricular ejection fraction, by estimation, is 50 to 55%. The left ventricle has low normal function. The left ventricle has no regional wall motion abnormalities. Left ventricular diastolic parameters are indeterminate.  2. Ventricular septum is flattened in systole and diastole suggesting RV pressure and volume overload. . Right ventricular systolic function is moderately reduced. The right ventricular size is moderately enlarged.  3. Left atrial size was severely dilated.  4. Right atrial size was severely dilated.  5. The mitral valve is normal in structure. Mild to moderate mitral valve regurgitation. No evidence of mitral stenosis.  6. The  tricuspid valve is abnormal. Tricuspid valve regurgitation is severe.  7. The aortic valve is tricuspid. Aortic valve regurgitation is mild.  8. The inferior vena cava is dilated in size with <50% respiratory variability, suggesting right atrial pressure of 15 mmHg. FINDINGS  Left Ventricle: Left ventricular ejection fraction, by estimation, is 50 to 55%. The left ventricle has low normal function. The left ventricle has no regional wall motion abnormalities. The left ventricular internal cavity size was normal in size. There is no left ventricular hypertrophy. Left ventricular diastolic parameters are indeterminate. Right Ventricle: Ventricular septum is flattened in systole and diastole suggesting RV pressure and volume overload. The right ventricular size is moderately enlarged. Right vetricular wall thickness was not assessed. Right ventricular systolic function is moderately reduced. Left Atrium: Left atrial size was severely dilated. Right Atrium: Right atrial size was severely dilated. Pericardium: There is no evidence of pericardial effusion. Mitral Valve: The mitral valve is normal in structure. Mild to moderate mitral valve regurgitation. No evidence of mitral valve stenosis. Tricuspid Valve: The tricuspid valve is abnormal. Tricuspid valve regurgitation is severe. No evidence of tricuspid stenosis. Aortic Valve: The aortic valve is tricuspid. Aortic valve regurgitation is mild. Aortic regurgitation PHT measures 450 msec. Aortic valve mean gradient measures 2.6 mmHg. Aortic valve peak gradient measures 4.8 mmHg. Aortic valve area, by VTI measures 1.38 cm. Pulmonic Valve: The pulmonic valve was not well visualized. Pulmonic valve regurgitation is not visualized. No evidence of pulmonic stenosis. Aorta: The aortic root is normal in size and structure. Pulmonary Artery: Mild pulmonary HTN, PASP is 35 mmHg. Venous: The inferior vena cava is dilated in size with less than 50% respiratory variability,  suggesting right atrial pressure of 15 mmHg. IAS/Shunts: The interatrial septum was not well visualized.  LEFT VENTRICLE PLAX 2D LVIDd:         3.73 cm LVIDs:         2.80 cm LV PW:         0.81 cm LV IVS:        0.87 cm LVOT diam:     1.60 cm LV SV:         25 LV SV Index:   16 LVOT Area:     2.01 cm  LV Volumes (MOD) LV vol d, MOD A2C: 44.7 ml LV vol d, MOD A4C: 39.9 ml  LV vol s, MOD A2C: 26.7 ml LV vol s, MOD A4C: 18.1 ml LV SV MOD A2C:     18.0 ml LV SV MOD A4C:     39.9 ml LV SV MOD BP:      19.1 ml RIGHT VENTRICLE RV S prime:     8.00 cm/s TAPSE (M-mode): 1.4 cm LEFT ATRIUM             Index       RIGHT ATRIUM           Index LA diam:        3.80 cm 2.50 cm/m  RA Area:     23.40 cm LA Vol (A2C):   48.7 ml 32.04 ml/m RA Volume:   74.60 ml  49.07 ml/m LA Vol (A4C):   70.8 ml 46.57 ml/m LA Biplane Vol: 59.9 ml 39.40 ml/m  AORTIC VALVE AV Area (Vmax):    1.40 cm AV Area (Vmean):   1.32 cm AV Area (VTI):     1.38 cm AV Vmax:           109.68 cm/s AV Vmean:          76.432 cm/s AV VTI:            0.178 m AV Peak Grad:      4.8 mmHg AV Mean Grad:      2.6 mmHg LVOT Vmax:         76.30 cm/s LVOT Vmean:        50.350 cm/s LVOT VTI:          0.122 m LVOT/AV VTI ratio: 0.69 AI PHT:            450 msec  AORTA Ao Root diam: 3.10 cm MITRAL VALVE                 TRICUSPID VALVE MV Area (PHT): 5.16 cm      TR Peak grad:   19.9 mmHg MV Decel Time: 147 msec      TR Vmax:        223.00 cm/s MR Peak grad:    117.1 mmHg MR Mean grad:    72.0 mmHg   SHUNTS MR Vmax:         541.00 cm/s Systemic VTI:  0.12 m MR Vmean:        392.0 cm/s  Systemic Diam: 1.60 cm MR PISA:         0.25 cm MR PISA Eff ROA: 1 mm MR PISA Radius:  0.20 cm MV E velocity: 86.30 cm/s Carlyle Dolly MD Electronically signed by Carlyle Dolly MD Signature Date/Time: 09/09/2020/1:13:32 PM    Final    US Abdomen Limited RUQ (LIVER/GB)  Result Date: 09/08/2020 CLINICAL DATA:  Right upper quadrant pain. EXAM: ULTRASOUND ABDOMEN LIMITED RIGHT UPPER  QUADRANT COMPARISON:  CT abdomen pelvis 09/07/2020 FINDINGS: Gallbladder: Gallbladder wall thickness upper limits of normal. Trace pericholecystic fluid. Negative sonographic Murphy sign. Possible 4 mm gallbladder polyp. Common bile duct: Diameter: 5.7 mm. Liver: No focal lesion identified. Within normal limits in parenchymal echogenicity. Questioned mass adjacent to the liver likely represents small adenoma demonstrated on prior CT. Portal vein is patent on color Doppler imaging with normal direction of blood flow towards the liver. Other: Small right pleural effusion. Enlarged IVC and hepatic veins. IMPRESSION: 1. No cholelithiasis. Gallbladder wall thickness upper limits of normal with trace pericholecystic fluid. Negative sonographic Murphy sign. 2. Enlarged IVC and hepatic veins suggesting some component of right heart  failure. Electronically Signed   By: Lovey Newcomer M.D.   On: 09/08/2020 10:16   DG ESOPHAGUS W SINGLE CM (SOL OR THIN BA)  Result Date: 09/11/2020 CLINICAL DATA:  Dysphagia for liquids, solids and pills, remote esophageal dilatation 2008 EXAM: ESOPHOGRAM/BARIUM SWALLOW TECHNIQUE: Single contrast examination was performed using thin barium and a 12.5 mm diameter barium tablet. FLUOROSCOPY TIME:  Fluoroscopy Time:  1 minutes 36 seconds Radiation Exposure Index (if provided by the fluoroscopic device): 20.7 mGy Number of Acquired Spot Images: multiple fluoroscopic screen captures COMPARISON:  None FINDINGS: Esophageal distention: Normal without mass or stricture. 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 Filling defects: Tiny anterior wall cervical esophageal web, nonobstructive. 12.5 mm barium tablet: Passed from oral cavity to stomach without obstruction Motility: Moderate diffuse esophageal dysmotility with incomplete clearance of barium by primary peristaltic waves. Weak secondary and scattered tertiary waves were seen. Prolonged thoracic esophageal retention of contrast. Mucosa:  Smooth without  irregularity or ulceration Hypopharynx/cervical esophagus: Laryngeal penetration without witnessed aspiration. However, when the patient was drinking water to swallow the 12.5 mm diameter barium tablet, she experience multiple episodes of spontaneous coughing, suspect unwitnessed aspiration. 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 Laryngeal penetration without aspiration though I suspect patient had multiple episodes of unwitnessed aspiration when swallowing the 12.5 mm diameter barium tablet with water. Electronically Signed   By: Lavonia Dana M.D.   On: 09/11/2020 09:49     Assessment and Plan:   1. 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  - indet diastolic function but with severe biatrial enlargement would suggest likely significant diasotlic dysfunction likely playing a role in right sided dysfunction.  - Will need outpatient PFTs to start workup. Decide further as outpatient how aggressive to be in workup given advanced age, family is also mentionign concerns about medical costs.   - has been on oral lasix 40mg  daily at home. Started on IV lasix 40mg  daily yesterday - incomplete I/Os, mild fluctuations in renal function. - dose IV lasix 40mg  bid today. Likely change to oral torsemide at discharge 40mg  daily.   - family veyr much wants to go home, potentially can discharge tomorrow on oral diuretic with close outpatient f/u  2. Afib -she is on amiodarone,  metoprolol, . Coumadin on hold during admission, on hep gtt - rates elevated, increase lopressor to 75mg  bid.    3.Dysphagia -workup per GI      For questions or updates, please contact [place] Please consult [email or web address] for contact info under    Signed, Carlyle Dolly, MD  09/11/2020 9:58 AM

## 2020-09-11 NOTE — Progress Notes (Signed)
  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 -Laryngeal penetration without aspiration though I suspect patient had multiple episodes of unwitnessed aspiration when swallowing the 12.5 mm diameter barium tablet with water.>> SLP spoke with Pt in room regarding BPE and recommendation to take her medication whole with puree and follow with liquid wash. Pt reportedly coughed when taking the barium tablet with water and the radiologist suspected aspiration. Pt agreeable to taking her medication with applesauce. She was also encouraged to cut up her meats well and to take small bites of solid foods given her subjective repor of difficulty swallowing solids and pills. Pt to follow up with GI once acute medical needs are addressed to see if she may be a candidate for dilation. Reflux precautions reviewed with Pt. No further SLP services indicated at this time.     HPI HPI: Norma Barajas is a 79 y.o. female with medical history significant of hypertension, hyperlipidemia, atrial fibrillation on anticoagulation, presents to the emergency room with complaints of intermittent abdominal pain for the past 2 months.  Symptoms have progressively gotten worse, particularly in the last 24 hours.  Pain is described in the right upper quadrant, epigastrium radiating to her right upper back.  Symptoms are worse with p.o. intake.  She has had nausea, but no vomiting.  Bowel movements have otherwise been normal.  She is not had any fever.  She also does describe progressive shortness of breath over the last few  months.  She reports is easier to breathe when she lays down flat, dyspnea is worse on exertion. BSE requested as Pt reported difficulty swallowing her pills yesterday.      SLP Plan  All goals met;Discharge SLP treatment due to (comment)       Recommendations  Diet recommendations: Dysphagia 3 (mechanical soft);Thin liquid Liquids provided via: Cup;Straw Medication Administration: Whole meds with puree Supervision: Patient able to self feed Compensations: Small sips/bites;Follow solids with liquid Postural Changes and/or Swallow Maneuvers: Seated upright 90 degrees;Upright 30-60 min after meal                Oral Care Recommendations: Oral care BID;Patient independent with oral care Follow up Recommendations: None SLP Visit Diagnosis: Dysphagia, unspecified (R13.10) Plan: All goals met;Discharge SLP treatment due to (comment)       Thank you,  Genene Churn, [place]               Thank you,  Genene Churn, [place]   [place] 09/11/2020, 3:26 PM

## 2020-09-11 NOTE — Progress Notes (Signed)
Barium esophagram: IMPRESSION: -Tiny anterior cervical esophageal web. -Diffuse esophageal dysmotility. 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 -Laryngeal penetration without aspiration though I suspect patient had multiple episodes of unwitnessed aspiration when swallowing the 12.5 mm diameter barium tablet with water.   Will follow up speech therapy evaluation. If patient is discharged tomorrow, she can follow up with Dr. Laural Golden as an outpatient. Swallowing issues appears multifactorial and EGD/ED may or may not be helpful.   Laureen Ochs. Bernarda Caffey [facility] [phone] 11/4/20213:19 PM

## 2020-09-11 NOTE — Progress Notes (Signed)
ANTICOAGULATION CONSULT NOTE -  Pharmacy Consult for heparin gtt  Indication: atrial fibrillation  Patient Measurements: Height: 5\' 1"  (154.9 cm) Weight: 51.8 kg (114 lb 4.8 oz) IBW/kg (Calculated) : 47.8 HEPARIN DW (KG): 54.4   Vital Signs: Temp: 98.5 F (36.9 C) (11/04 0510) Temp Source: Oral (11/04 0510) BP: 136/106 (11/04 0510) Pulse Rate: 105 (11/04 0510)  Labs: Recent Labs    09/09/20 0553 09/09/20 0553 09/10/20 0442 09/11/20 0439  HGB 15.1*   < > 15.8* 15.5*  HCT 47.4*  --  48.6* 49.0*  PLT 177  --  184 184  LABPROT 29.3*  --  26.4* 19.9*  INR 2.9*  --  2.5* 1.8*  CREATININE 0.97  --  0.98 1.06*   < > = values in this interval not displayed.    Estimated Creatinine Clearance: 31.4 mL/min (A) (by C-G formula based on SCr of 1.06 mg/dL (H)).   Assessment: Norma Barajas a 79 y.o. female requires anticoagulation with a heparin iv infusion for the indication of  atrial fibrillation. Heparin gtt will be started following pharmacy protocol per pharmacy consult. Patient is on warfarin prior to admission - admit INR 2.9 therapeutic   09/11/20 0900 UPDATE INR : 1.8 CBC: Hb 15.5  Plates 177>184>184  Goal of Therapy:  Heparin level 0.3-0.7 units/ml Monitor platelets by anticoagulation protocol: Yes   Plan:  INR 1.8 today-->Start heparin infusion at 750 units/hr  (no bolus d/t high INR) Check heparin level in 6-8 hrs Daily heparin level and CBC  ordered Monitor for signs and symptoms of bleeding   Despina Pole, Pharm. D. Clinical Pharmacist 09/11/2020 8:58 AM

## 2020-09-11 NOTE — Plan of Care (Signed)

## 2020-09-11 NOTE — Care Management Important Message (Signed)
Important Message  Patient Details  Name: Norma Barajas MRN: [id number] Date of Birth: [birth year]   Medicare Important Message Given:  Yes     Tommy Medal 09/11/2020, 12:06 PM

## 2020-09-12 ENCOUNTER — Telehealth: Payer: Self-pay | Admitting: *Deleted

## 2020-09-12 DIAGNOSIS — I50811 Acute right heart failure: Secondary | ICD-10-CM | POA: Diagnosis not present

## 2020-09-12 LAB — BASIC METABOLIC PANEL
Anion gap: 11 (ref 5–15)
BUN: 20 mg/dL (ref 8–23)
CO2: 30 mmol/L (ref 22–32)
Calcium: 8.5 mg/dL — ABNORMAL LOW (ref 8.9–10.3)
Chloride: 97 mmol/L — ABNORMAL LOW (ref 98–111)
Creatinine, Ser: 1.04 mg/dL — ABNORMAL HIGH (ref 0.44–1.00)
GFR, Estimated: 54 mL/min — ABNORMAL LOW (ref >60)
Glucose, Bld: 94 mg/dL (ref 70–99)
Potassium: 3.2 mmol/L — ABNORMAL LOW (ref 3.5–5.1)
Sodium: 138 mmol/L (ref 135–145)

## 2020-09-12 LAB — CBC
HCT: 46.3 % — ABNORMAL HIGH (ref 36.0–46.0)
Hemoglobin: 14.7 g/dL (ref 12.0–15.0)
MCH: 30.6 pg (ref 26.0–34.0)
MCHC: 31.7 g/dL (ref 30.0–36.0)
MCV: 96.5 fL (ref 80.0–100.0)
Platelets: 157 10*3/uL (ref 150–400)
RBC: 4.8 MIL/uL (ref 3.87–5.11)
RDW: 18.5 % — ABNORMAL HIGH (ref 11.5–15.5)
WBC: 5 10*3/uL (ref 4.0–10.5)
nRBC: 0 % (ref 0.0–0.2)

## 2020-09-12 LAB — MAGNESIUM: Magnesium: 1.7 mg/dL (ref 1.7–2.4)

## 2020-09-12 LAB — PROTIME-INR
INR: 1.5 — ABNORMAL HIGH (ref 0.8–1.2)
Prothrombin Time: 17.6 seconds — ABNORMAL HIGH (ref 11.4–15.2)

## 2020-09-12 LAB — HEPARIN LEVEL (UNFRACTIONATED): Heparin Unfractionated: 0.16 IU/mL — ABNORMAL LOW (ref 0.30–0.70)

## 2020-09-12 MED ORDER — POTASSIUM CHLORIDE 20 MEQ/15ML (10%) PO SOLN: 40.0000 meq | Freq: Once | ORAL | Status: DC

## 2020-09-12 MED ORDER — TORSEMIDE 20 MG PO TABS
20.0000 mg | ORAL_TABLET | Freq: Every day | ORAL | Status: DC
  Administered 2020-09-12: 20 mg via ORAL
  Filled 2020-09-12: qty 1

## 2020-09-12 MED ORDER — METOPROLOL TARTRATE 50 MG PO TABS
100.0000 mg | ORAL_TABLET | Freq: Two times a day (BID) | ORAL | Status: DC
  Administered 2020-09-12: 100 mg via ORAL
  Filled 2020-09-12: qty 2

## 2020-09-12 MED ORDER — TORSEMIDE 20 MG PO TABS
20.0000 mg | ORAL_TABLET | Freq: Every day | ORAL | 1 refills | Status: DC
Start: 2020-09-13 — End: 2020-12-05

## 2020-09-12 MED ORDER — METOPROLOL TARTRATE 100 MG PO TABS
100.0000 mg | ORAL_TABLET | Freq: Two times a day (BID) | ORAL | 1 refills | Status: DC
Start: 2020-09-12 — End: 2020-12-09

## 2020-09-12 NOTE — Telephone Encounter (Signed)
Noted  

## 2020-09-12 NOTE — Telephone Encounter (Signed)
Norma Barajas is Sep 23, 2020 at 10:00

## 2020-09-12 NOTE — Plan of Care (Signed)
Patient being discharged. Has follow up appointments made for specialty follow-up and primary care doctor. No questions at this time.

## 2020-09-12 NOTE — TOC Transition Note (Signed)
Transition of Care [facility]) - CM/SW Discharge Note   Patient Details  Name: FALLYN MUNNERLYN MRN: [id number] Date of Birth: [birth year]  Transition of Care [facility]) CM/SW Contact:  Natasha Bence, LCSW Phone Number: 09/12/2020, 12:02 PM   Clinical Narrative:    CSW notified of patient's readiness for discharge. CSW notified Georgina Snell of patient's discharge. Georgina Snell agreeable to begin services upon discharge. TOC signing off.      Barriers to Discharge: Continued Medical Work up   Patient Goals and CMS Choice Patient states their goals for this hospitalization and ongoing recovery are:: return home   Choice offered to / list presented to : Spouse  Discharge Placement                       Discharge Plan and Services In-house Referral: Clinical Social Work   Post Acute Care Choice: Home Health          DME Arranged: N/A DME Agency: NA       HH Arranged: PT [place] Agency: [place] Date [facility] Agency Contacted: 09/10/20 Time [place]: [phone] Representative spoke with at [place]: [place] Interventions     Readmission Risk Interventions No flowsheet data found.

## 2020-09-12 NOTE — Progress Notes (Addendum)
Progress Note  Patient Name: Norma Barajas Date of Encounter: 09/12/2020  [facility] Cardiologist: Minus Breeding, MD   Subjective   No Norma Barajas  Inpatient Medications    Scheduled Meds: . amiodarone  200 mg Oral Daily  . amLODipine  10 mg Oral Daily  . feeding supplement  1 Container Oral BID BM  . furosemide  40 mg Intravenous Daily  . metoprolol tartrate  75 mg Oral BID  . multivitamin with minerals  1 tablet Oral Daily  . pantoprazole  40 mg Oral Daily  . potassium chloride  40 mEq Oral Daily  . saccharomyces boulardii  250 mg Oral BID   Continuous Infusions: . sodium chloride 500 mL (09/07/20 1833)  . heparin 950 Units/hr (09/11/20 2204)   PRN Meds: sodium chloride, acetaminophen **OR** acetaminophen, fentaNYL (SUBLIMAZE) injection, metoprolol tartrate, ondansetron **OR** ondansetron (ZOFRAN) IV   Vital Signs    Vitals:   09/12/20 0042 09/12/20 0407 09/12/20 0415 09/12/20 0741  BP: 109/83 (!) 116/92  (!) 130/102  Pulse: (!) 106 (!) 126  (!) 114  Resp: 16 16  19   Temp: 98.1 F (36.7 C) 97.9 F (36.6 C)  (!) 97.5 F (36.4 C)  TempSrc:  Oral    SpO2: 94% 96%  95%  Weight:   51.8 kg   Height:        Intake/Output Summary (Last 24 hours) at 09/12/2020 0821 Last data filed at 09/12/2020 0743 Gross per 24 hour  Intake 1070.65 ml  Output 1750 ml  Net -679.35 ml   Last 3 Weights 09/12/2020 09/11/2020 09/10/2020  Weight (lbs) 114 lb 3.2 oz 114 lb 4.8 oz 114 lb 10.2 oz  Weight (kg) 51.801 kg 51.846 kg 52 kg      Telemetry    afib elevated rates at times - Personally Reviewed  ECG    n/a - Personally Reviewed  Physical Exam   GEN: No acute distress.   Neck: elevated JVD Cardiac: irreg Respiratory: Clear to auscultation bilaterally. GI: Soft, nontender, non-distended  MS: No edema; No deformity. Neuro:  Nonfocal  Psych: Normal affect   Labs    High Sensitivity Troponin:   Recent Labs  Lab 09/07/20 0914 09/07/20 1038  TROPONINIHS 7 14       Chemistry Recent Labs  Lab 09/08/20 0424 09/08/20 0424 09/09/20 0553 09/09/20 0553 09/10/20 0442 09/11/20 0439 09/12/20 0625  NA 138   < > 139   < > 139 138 138  K 4.2   < > 3.2*   < > 3.1* 3.1* 3.2*  CL 101   < > 98   < > 95* 95* 97*  CO2 24   < > 28   < > 29 30 30   GLUCOSE 72   < > 85   < > 112* 107* 94  BUN 13   < > 14   < > 13 15 20   CREATININE 0.86   < > 0.97   < > 0.98 1.06* 1.04*  CALCIUM 8.2*   < > 8.7*   < > 8.7* 8.4* 8.5*  PROT 5.7*  --  6.2*  --   --  6.4*  --   ALBUMIN 3.1*  --  3.4*  --   --  3.5  --   AST 26  --  28  --   --  35  --   ALT 18  --  18  --   --  20  --   ALKPHOS 52  --  56  --   --  63  --   BILITOT 1.0  --  1.3*  --   --  1.0  --   GFRNONAA >60   < > 59*   < > 58* 53* 54*  ANIONGAP 13   < > 13   < > 15 13 11    < > = values in this interval not displayed.     Hematology Recent Labs  Lab 09/10/20 0442 09/11/20 0439 09/12/20 0625  WBC 5.8 5.9 5.0  RBC 5.13* 5.05 4.80  HGB 15.8* 15.5* 14.7  HCT 48.6* 49.0* 46.3*  MCV 94.7 97.0 96.5  MCH 30.8 30.7 30.6  MCHC 32.5 31.6 31.7  RDW 18.7* 19.1* 18.5*  PLT 184 184 157    BNP Recent Labs  Lab 09/10/20 0442  BNP 546.0*     DDimer  Recent Labs  Lab 09/09/20 2102  DDIMER 1.06*     Radiology    DG ESOPHAGUS W SINGLE CM (SOL OR THIN BA)  Result Date: 09/11/2020 CLINICAL DATA:  Dysphagia for liquids, solids and pills, remote esophageal dilatation 2008 EXAM: ESOPHOGRAM/BARIUM SWALLOW TECHNIQUE: Single contrast examination was performed using thin barium and a 12.5 mm diameter barium tablet. FLUOROSCOPY TIME:  Fluoroscopy Time:  1 minutes 36 seconds Radiation Exposure Index (if provided by the fluoroscopic device): 20.7 mGy Number of Acquired Spot Images: multiple fluoroscopic screen captures COMPARISON:  None FINDINGS: Esophageal distention: Normal without mass or stricture. 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 Filling defects: Tiny anterior wall cervical esophageal web, nonobstructive.  12.5 mm barium tablet: Passed from oral cavity to stomach without obstruction Motility: Moderate diffuse esophageal dysmotility with incomplete clearance of barium by primary peristaltic waves. Weak secondary and scattered tertiary waves were seen. Prolonged thoracic esophageal retention of contrast. Mucosa:  Smooth without irregularity or ulceration Hypopharynx/cervical esophagus: Laryngeal penetration without witnessed aspiration. However, when the patient was drinking water to swallow the 12.5 mm diameter barium tablet, she experience multiple episodes of spontaneous coughing, suspect unwitnessed aspiration. 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 Laryngeal penetration without aspiration though I suspect patient had multiple episodes of unwitnessed aspiration when swallowing the 12.5 mm diameter barium tablet with water. Electronically Signed   By: Lavonia Dana M.D.   On: 09/11/2020 09:49    Cardiac Studies    Patient Profile     Norma Barajas is a 79 y.o. female with a hx of afib who is being seen today for the evaluation of SOB at the request of Dr Tat.  Assessment & Plan    1. 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  - indet diastolic function but with severe biatrial enlargement would suggest likely significant diasotlic dysfunction likely playing some role in right sided dysfunction.  - Will need outpatient PFTs to start workup. Decide further as outpatient  how aggressive to be in workup given advanced age, family is also mentionign concerns about medical costs.   - has been on oral lasix 40mg  daily at home. Received  IV lasix yesterday 40mg  x 2. Negative 568mL yesterday. Mild variation in Cr without clear trend.   - change to oral torsemide 20mg  daily - will need a daily KCl supplement at discharge.  -will need bmet at f/u.    2. Afib -she is on amiodarone, metoprolol, . Coumadin on hold during admission, on hep gtt - rates elevated, increased lopressor to 75mg  bid yesterday - elevated at times, increase lopressor to 100mg  bid.    3.Dysphagia -workup per GI  Ok for discharge, we will arrange outpatient f/u. We will sign off inpatient care.   For questions or updates, please contact [place] Please consult [email or web address] for contact info under        Signed, Carlyle Dolly, MD  09/12/2020, 8:21 AM

## 2020-09-12 NOTE — Telephone Encounter (Signed)
TRANSITIONAL CARE MANAGEMENT TELEPHONE OUTREACH NOTE   Contact Date: 09/12/2020 Contacted By: Baldomero Lamy, LPN   DISCHARGE INFORMATION Date of Discharge:09/12/2020 Discharge Facility: [facility] Discharge Diagnosis: abdominal pain, dysphagia, A-fib chronic, AcutediastolicCHF--R-sided, generalized weakness, hypertension, hyperlipidemia, hypokalemia  Outpatient Follow Up Recommendations (copied from discharge summary) Recommendations for Outpatient Follow-up:  1. Follow up with PCP in 1-2 weeks 2. Please obtain BMP/CBC in one week  Norma Barajas is a female primary care patient of Loman Brooklyn, FNP. An outgoing telephone call was made today and I spoke with Norma Barajas.  Norma Barajas condition(s) and treatment(s) were discussed. An opportunity to ask questions was provided and all were answered or forwarded as appropriate.    ACTIVITIES OF DAILY LIVING  Norma Barajas lives with her spouse and she can perform ADLs independently. She is her own primary caregiver.  She is able to depend on her primary caregiver(s) for consistent help.  Transportation to appointments, to pick up medications, and to run errands is not a problem.  (Consider referral to [place] if transportation or a consistent caregiver is a problem)   Fall Risk Fall Risk  08/27/2020 08/18/2020  Falls in the past year? 0 1  Number falls in past yr: - 1  Injury with Fall? - 0  Risk Factor Category  - -  Risk for fall due to : - -  Risk for fall due to: Comment - -  Follow up - -    [place] Modifications/Assistive Devices Wheelchair: Yes Cane: Yes Ramp: Yes Bedside Toilet: Yes Hospital Bed:  No Other: [place] She is not receiving home health services.     MEDICATION RECONCILIATION  Norma Barajas has been able to pick-up all prescribed discharge medications from the pharmacy.   A post discharge medication reconciliation was performed and the complete medication list  was reviewed with the patient/caregiver and is current as of 09/12/2020. Changes highlighted below.  Discontinued Medications STOP taking these medications   furosemide 40 MG tablet Commonly known as: Lasix   lisinopril 10 MG tablet Commonly known as: ZESTRIL     Current Medication List Allergies as of 09/12/2020      Reactions   Codeine Nausea And Vomiting   Doxycycline Other (See Comments)   Chest congestion   Triamterene-hctz Other (See Comments)   weakness   Atorvastatin Other (See Comments)   Myalgias   Crestor [rosuvastatin Calcium] Other (See Comments)   weakness   Morphine Nausea Only   Risedronate Sodium Other (See Comments)   ACTONEL - reflux      Medication List       Accurate as of September 12, 2020  2:52 PM. If you have any questions, ask your nurse or doctor.        acetaminophen 500 MG tablet Commonly known as: TYLENOL Take 1,000 mg by mouth 2 (two) times daily.   amiodarone 200 MG tablet Commonly known as: PACERONE Take 1 tablet (200 mg total) by mouth daily.   amLODipine 10 MG tablet Commonly known as: NORVASC TAKE 1 TABLET EVERY DAY   cholecalciferol 25 MCG (1000 UNIT) tablet Commonly known as: VITAMIN D3 Take 1,000 Units by mouth daily.   Fish Oil 1000 MG Caps Take 1,000 mg by mouth 2 (two) times daily.   loratadine 10 MG tablet Commonly known as: CLARITIN Take 10 mg by mouth daily as needed for allergies.   metoprolol tartrate 100 MG tablet Commonly  known as: LOPRESSOR Take 1 tablet (100 mg total) by mouth 2 (two) times daily.   multivitamin capsule Take 1 capsule by mouth every evening.   omeprazole 20 MG capsule Commonly known as: PRILOSEC Take 1 capsule (20 mg total) by mouth daily.   potassium chloride 10 MEQ tablet Commonly known as: KLOR-CON Take 3 tablets (30 mEq total) by mouth daily with breakfast.   simvastatin 20 MG tablet Commonly known as: ZOCOR Take 1 tablet (20 mg total) by mouth daily.   torsemide 20 MG  tablet Commonly known as: DEMADEX Take 1 tablet (20 mg total) by mouth daily. Start taking on: September 13, 2020   warfarin 3 MG tablet Commonly known as: COUMADIN Take as directed by the anticoagulation clinic. If you are unsure how to take this medication, talk to your nurse or doctor. Original instructions: TAKE 1/2 TO 1 TABLET EVERY DAY AS DIRECTED BY  ANTICOAGULATION CLINIC What changed: See the new instructions.        PATIENT EDUCATION & FOLLOW-UP PLAN  An appointment for Transitional Care Management is scheduled with Jac Canavan, NP on 09/16/20 at 9:30am.   Take all medications as prescribed  Contact our office by calling [phone] if you have any questions or concerns

## 2020-09-12 NOTE — Discharge Summary (Addendum)
Physician Discharge Summary  Norma Barajas HEN:[id number] DOB: [birth year] DOA: 09/07/2020  PCP: Loman Brooklyn, FNP  Admit date: 09/07/2020 Discharge date: 09/12/2020  Admitted From: Home Disposition:  Home   Recommendations for Outpatient Follow-up:  1. Follow up with PCP in 1-2 weeks 2. Please obtain BMP/CBC in one week     Discharge Condition: Stable CODE STATUS: DNR Diet recommendation: Heart Healthy--dyphagia 3 with thin liquid   Brief/Interim Summary: Norma H Goadis a 79 y.o.femalewith medical history significant ofhypertension, hyperlipidemia, atrial fibrillation on anticoagulation, presents to the emergency room with complaints of intermittent abdominal pain for the past 2 months. Symptoms have progressively gotten worse, particularly in the last 24 hours. Pain is described in the right upper quadrant, epigastrium radiating to her right upper back. Symptoms are worse with p.o. intake. She has had nausea, but no vomiting. Bowel movements have otherwise been normal. She is not had any fever. She also does describe progressive shortness of breath over the last few months. She reports is easier to breathe when she lays down flat, dyspnea is worse on exertion.  ED Course:She is noted to be tachycardic in atrial fibrillation. Blood pressure is elevated. WBC count is normal. Transaminases also are not elevated, although bilirubin is mildly elevated. Lactic acid also elevated. CT of the abdomen and pelvis indicated possible gallbladder wall thickening and pericholecystic fluid. She has been referred for admission.  Discharge Diagnoses:  Abdominal pain -Initially, concerns for acalculous cholecystitis based on CT findings -Ultrasound does not show any evidence of stones, negative Murphy sign -She is afebrile, has no leukocytosis and LFTs are otherwise unremarkable -She was initially started on IV antibiotics -Seen by general surgery and it is felt that  cholecystitis at this point is less likely -Antibiotics have been discontinued -Tolerating liquid diet, advanced to solid food, monitor for tolerance -11/3--states abd pain is not much better, worse with food--discussed with general surgery--Dr. Jones Skene HIDA scan, consult GI -suspect this pain is due to chronic hepatic congestion  Dysphagia -consult GI-[facility]hman -discussed with Dr. Marin Olp esophagram -11/4--esophagram--tiny cervical esophageal web, diffuse esophageal dysmotility, Laryngeal penetration without aspiration -consult speech--dysphagia 3 with thin liquid  Atrial fibrillation, chronic -INR is currently therapeutic -Pharmacy to dose coumadin -Continue metoprolol for rate control as well as amiodarone -increase metoprolol dose due to elevated HR--100 mg bid  AcutediastolicCHF--R-sided -remains clinically fluid overloaded -EchoEF 50-55%, no WMA, RV volume overload, mild-mod MR, severe TR -She received a dose of lasix on 11/1 -continue IV lasix-->bid on 11/4 -d/c home with torsemide 20 mg daily -consult cardiology--appreciated  Generalized weakness -physical therapy evaluation  Hypertension -Blood pressure currently elevated -Continue amlodipine and metoprolol  Hyperlipidemia -Hold statin for now  Hypokalemia -Replace -check mag--1.9     Discharge Instructions  Discharge Instructions    Diet - low sodium heart healthy   Complete by: As directed    Increase activity slowly   Complete by: As directed      Allergies as of 09/12/2020      Reactions   Codeine Nausea And Vomiting   Doxycycline Other (See Comments)   Chest congestion   Triamterene-hctz Other (See Comments)   weakness   Atorvastatin Other (See Comments)   Myalgias   Crestor [rosuvastatin Calcium] Other (See Comments)   weakness   Morphine Nausea Only   Risedronate Sodium Other (See Comments)   ACTONEL - reflux      Medication List    STOP taking these  medications   furosemide 40 MG tablet Commonly known  as: Lasix   lisinopril 10 MG tablet Commonly known as: ZESTRIL     TAKE these medications   acetaminophen 500 MG tablet Commonly known as: TYLENOL Take 1,000 mg by mouth 2 (two) times daily.   amiodarone 200 MG tablet Commonly known as: PACERONE Take 1 tablet (200 mg total) by mouth daily.   amLODipine 10 MG tablet Commonly known as: NORVASC TAKE 1 TABLET EVERY DAY   cholecalciferol 25 MCG (1000 UNIT) tablet Commonly known as: VITAMIN D3 Take 1,000 Units by mouth daily.   Fish Oil 1000 MG Caps Take 1,000 mg by mouth 2 (two) times daily.   loratadine 10 MG tablet Commonly known as: CLARITIN Take 10 mg by mouth daily as needed for allergies.   metoprolol tartrate 100 MG tablet Commonly known as: LOPRESSOR Take 1 tablet (100 mg total) by mouth 2 (two) times daily. What changed:   medication strength  how much to take   multivitamin capsule Take 1 capsule by mouth every evening.   omeprazole 20 MG capsule Commonly known as: PRILOSEC Take 1 capsule (20 mg total) by mouth daily.   potassium chloride 10 MEQ tablet Commonly known as: KLOR-CON Take 3 tablets (30 mEq total) by mouth daily with breakfast.   simvastatin 20 MG tablet Commonly known as: ZOCOR Take 1 tablet (20 mg total) by mouth daily.   torsemide 20 MG tablet Commonly known as: DEMADEX Take 1 tablet (20 mg total) by mouth daily. Start taking on: September 13, 2020   warfarin 3 MG tablet Commonly known as: COUMADIN Take as directed. If you are unsure how to take this medication, talk to your nurse or doctor. Original instructions: TAKE 1/2 TO 1 TABLET EVERY DAY AS DIRECTED BY  ANTICOAGULATION CLINIC What changed: See the new instructions.       Follow-up Information    [facility] Follow up.   Specialty: Home Health Services Why: Will contact you to schedule home health visits. Contact information: [place] [address] [phone]        Minus Breeding, MD Follow up on 09/24/2020.   Specialty: Cardiology Why: [facility]iology Hospital Follow-up on 09/24/2020 at 2:20 PM.  Contact information: [place] [address] [phone]              Allergies  Allergen Reactions  . Codeine Nausea And Vomiting  . Doxycycline Other (See Comments)    Chest congestion  . Triamterene-Hctz Other (See Comments)    weakness  . Atorvastatin Other (See Comments)    Myalgias   . Crestor [Rosuvastatin Calcium] Other (See Comments)    weakness  . Morphine Nausea Only  . Risedronate Sodium Other (See Comments)    ACTONEL - reflux    Consultations:  GI  cardiology   Procedures/Studies: DG Lumbar Spine 2-3 Views  Result Date: 08/18/2020 CLINICAL DATA:  79 year old female with low back pain. EXAM: LUMBAR SPINE - 2-3 VIEW COMPARISON:  Lumbar spine radiograph dated 07/31/2015. FINDINGS: There is no acute fracture or subluxation of the lumbar spine. There is osteopenia with degenerative changes and lumbar levoscoliosis. Multilevel facet arthropathy. There is atherosclerotic calcification of the aorta. The soft tissues are unremarkable. IMPRESSION: No acute fracture or subluxation of the lumbar spine. Electronically Signed   By: Anner Crete M.D.   On: 08/18/2020 22:16   CT ABDOMEN PELVIS W CONTRAST  Result Date: 09/07/2020 CLINICAL DATA:  Abdominal pain, lower back pain for 3 months. EXAM: CT ABDOMEN  AND PELVIS WITH CONTRAST TECHNIQUE: Multidetector CT imaging of the abdomen and pelvis was performed using the standard protocol following bolus administration of intravenous contrast. CONTRAST:  93mL OMNIPAQUE IOHEXOL 300 MG/ML  SOLN COMPARISON:  CT abdomen pelvis dated 07/10/2019 and other prior exams. FINDINGS: Lower chest: There are small bilateral pleural effusions with associated atelectasis. Hepatobiliary: Two hepatic cysts are redemonstrated. There is a gallbladder  wall thickening with surrounding edema. No definite gallstones are identified. No biliary dilatation. Pancreas: A 6 mm low attenuating mass in the pancreatic tail is not significantly changed since at least 11/28/2012 and presumed benign. No pancreatic ductal dilatation or surrounding inflammatory changes. Spleen: Normal in size without focal abnormality. Adrenals/Urinary Tract: A 1.5 cm lipid rich adrenal adenoma in the right is unchanged and benign. The left adrenal gland appears normal. Kidneys are normal, without renal calculi, focal lesion, or hydronephrosis. Bladder is unremarkable. Stomach/Bowel: Stomach is within normal limits. Appendix appears normal. There is colonic diverticulosis without evidence of diverticulitis. No evidence of bowel wall thickening, distention, or inflammatory changes. Vascular/Lymphatic: Aortic atherosclerosis. No enlarged abdominal or pelvic lymph nodes. Reproductive: Status post hysterectomy. No adnexal masses. Other: There is moderate volume free intraperitoneal fluid. No abdominal wall hernia is identified. Musculoskeletal: Degenerative changes are seen in the spine. IMPRESSION: 1. Gallbladder wall thickening with surrounding edema may represent acute cholecystitis. Right upper quadrant ultrasound could be performed if the clinically indicated. 2. Moderate volume free intraperitoneal fluid. 3. Small bilateral pleural effusions with associated atelectasis. Aortic Atherosclerosis (ICD10-I70.0). Electronically Signed   By: Zerita Boers M.D.   On: 09/07/2020 10:37   DG Chest Portable 1 View  Result Date: 09/07/2020 CLINICAL DATA:  Pt states she has been having BP issues onset for several days. Pt denies sob, cp, fever, or N/V. No COVID test ordered at this time. HX of hypertension. EXAM: PORTABLE CHEST 1 VIEW COMPARISON:  10/26/2017.  CT, 11/23/2017. FINDINGS: Heart mildly enlarged.  No mediastinal or hilar masses. Lungs demonstrate vascular prominence. There is interstitial  thickening and linear areas of scarring or atelectasis at the lung bases, greater on the left. Additional left lung base opacity obscures hemidiaphragm, which may reflect a pleural effusion in addition to the lung base opacity. No convincing pulmonary edema. No pneumothorax. Skeletal structures are grossly intact. IMPRESSION: 1. Left lung base opacity that is likely a combination of a small effusion and atelectasis. Consider pneumonia if there are consistent clinical findings. 2. Vascular congestion and mild lower lung interstitial thickening without convincing pulmonary edema. Electronically Signed   By: Lajean Manes M.D.   On: 09/07/2020 09:37   ECHOCARDIOGRAM COMPLETE  Result Date: 09/09/2020    ECHOCARDIOGRAM REPORT   Patient Name:   Norma Barajas Date of Exam: 09/09/2020 Medical Rec #:  [id number]   Height:       61.0 in Accession #:    [id number]  Weight:       120.0 lb Date of Birth:  04-11-1941   BSA:          1.520 m Patient Age:    38 years    BP:           145/112 mmHg Patient Gender: F           HR:           100 bpm. Exam Location:  [facility] Procedure: 2D Echo, Cardiac Doppler and Color Doppler Indications:    Congestive Heart Failure 428.0 / I50.9  History:  Patient has prior history of Echocardiogram examinations, most                 recent 04/11/2015. Arrythmias:Atrial Fibrillation,                 Signs/Symptoms:Murmur; Risk Factors:Hypertension and                 Dyslipidemia. Chronic anticoagulation.  Sonographer:    Alvino Chapel RCS Referring Phys: [place]  1. Left ventricular ejection fraction, by estimation, is 50 to 55%. The left ventricle has low normal function. The left ventricle has no regional wall motion abnormalities. Left ventricular diastolic parameters are indeterminate.  2. Ventricular septum is flattened in systole and diastole suggesting RV pressure and volume overload. . Right ventricular systolic function is moderately reduced. The right  ventricular size is moderately enlarged.  3. Left atrial size was severely dilated.  4. Right atrial size was severely dilated.  5. The mitral valve is normal in structure. Mild to moderate mitral valve regurgitation. No evidence of mitral stenosis.  6. The tricuspid valve is abnormal. Tricuspid valve regurgitation is severe.  7. The aortic valve is tricuspid. Aortic valve regurgitation is mild.  8. The inferior vena cava is dilated in size with <50% respiratory variability, suggesting right atrial pressure of 15 mmHg. FINDINGS  Left Ventricle: Left ventricular ejection fraction, by estimation, is 50 to 55%. The left ventricle has low normal function. The left ventricle has no regional wall motion abnormalities. The left ventricular internal cavity size was normal in size. There is no left ventricular hypertrophy. Left ventricular diastolic parameters are indeterminate. Right Ventricle: Ventricular septum is flattened in systole and diastole suggesting RV pressure and volume overload. The right ventricular size is moderately enlarged. Right vetricular wall thickness was not assessed. Right ventricular systolic function is moderately reduced. Left Atrium: Left atrial size was severely dilated. Right Atrium: Right atrial size was severely dilated. Pericardium: There is no evidence of pericardial effusion. Mitral Valve: The mitral valve is normal in structure. Mild to moderate mitral valve regurgitation. No evidence of mitral valve stenosis. Tricuspid Valve: The tricuspid valve is abnormal. Tricuspid valve regurgitation is severe. No evidence of tricuspid stenosis. Aortic Valve: The aortic valve is tricuspid. Aortic valve regurgitation is mild. Aortic regurgitation PHT measures 450 msec. Aortic valve mean gradient measures 2.6 mmHg. Aortic valve peak gradient measures 4.8 mmHg. Aortic valve area, by VTI measures 1.38 cm. Pulmonic Valve: The pulmonic valve was not well visualized. Pulmonic valve regurgitation is not  visualized. No evidence of pulmonic stenosis. Aorta: The aortic root is normal in size and structure. Pulmonary Artery: Mild pulmonary HTN, PASP is 35 mmHg. Venous: The inferior vena cava is dilated in size with less than 50% respiratory variability, suggesting right atrial pressure of 15 mmHg. IAS/Shunts: The interatrial septum was not well visualized.  LEFT VENTRICLE PLAX 2D LVIDd:         3.73 cm LVIDs:         2.80 cm LV PW:         0.81 cm LV IVS:        0.87 cm LVOT diam:     1.60 cm LV SV:         25 LV SV Index:   16 LVOT Area:     2.01 cm  LV Volumes (MOD) LV vol d, MOD A2C: 44.7 ml LV vol d, MOD A4C: 39.9 ml LV vol s, MOD A2C: 26.7 ml LV vol s,  MOD A4C: 18.1 ml LV SV MOD A2C:     18.0 ml LV SV MOD A4C:     39.9 ml LV SV MOD BP:      19.1 ml RIGHT VENTRICLE RV S prime:     8.00 cm/s TAPSE (M-mode): 1.4 cm LEFT ATRIUM             Index       RIGHT ATRIUM           Index LA diam:        3.80 cm 2.50 cm/m  RA Area:     23.40 cm LA Vol (A2C):   48.7 ml 32.04 ml/m RA Volume:   74.60 ml  49.07 ml/m LA Vol (A4C):   70.8 ml 46.57 ml/m LA Biplane Vol: 59.9 ml 39.40 ml/m  AORTIC VALVE AV Area (Vmax):    1.40 cm AV Area (Vmean):   1.32 cm AV Area (VTI):     1.38 cm AV Vmax:           109.68 cm/s AV Vmean:          76.432 cm/s AV VTI:            0.178 m AV Peak Grad:      4.8 mmHg AV Mean Grad:      2.6 mmHg LVOT Vmax:         76.30 cm/s LVOT Vmean:        50.350 cm/s LVOT VTI:          0.122 m LVOT/AV VTI ratio: 0.69 AI PHT:            450 msec  AORTA Ao Root diam: 3.10 cm MITRAL VALVE                 TRICUSPID VALVE MV Area (PHT): 5.16 cm      TR Peak grad:   19.9 mmHg MV Decel Time: 147 msec      TR Vmax:        223.00 cm/s MR Peak grad:    117.1 mmHg MR Mean grad:    72.0 mmHg   SHUNTS MR Vmax:         541.00 cm/s Systemic VTI:  0.12 m MR Vmean:        392.0 cm/s  Systemic Diam: 1.60 cm MR PISA:         0.25 cm MR PISA Eff ROA: 1 mm MR PISA Radius:  0.20 cm MV E velocity: 86.30 cm/s Carlyle Dolly MD  Electronically signed by Carlyle Dolly MD Signature Date/Time: 09/09/2020/1:13:32 PM    Final    US Abdomen Limited RUQ (LIVER/GB)  Result Date: 09/08/2020 CLINICAL DATA:  Right upper quadrant pain. EXAM: ULTRASOUND ABDOMEN LIMITED RIGHT UPPER QUADRANT COMPARISON:  CT abdomen pelvis 09/07/2020 FINDINGS: Gallbladder: Gallbladder wall thickness upper limits of normal. Trace pericholecystic fluid. Negative sonographic Murphy sign. Possible 4 mm gallbladder polyp. Common bile duct: Diameter: 5.7 mm. Liver: No focal lesion identified. Within normal limits in parenchymal echogenicity. Questioned mass adjacent to the liver likely represents small adenoma demonstrated on prior CT. Portal vein is patent on color Doppler imaging with normal direction of blood flow towards the liver. Other: Small right pleural effusion. Enlarged IVC and hepatic veins. IMPRESSION: 1. No cholelithiasis. Gallbladder wall thickness upper limits of normal with trace pericholecystic fluid. Negative sonographic Murphy sign. 2. Enlarged IVC and hepatic veins suggesting some component of right heart failure. Electronically Signed   By: Polly Cobia.D.  On: 09/08/2020 10:16   DG ESOPHAGUS W SINGLE CM (SOL OR THIN BA)  Result Date: 09/11/2020 CLINICAL DATA:  Dysphagia for liquids, solids and pills, remote esophageal dilatation 2008 EXAM: ESOPHOGRAM/BARIUM SWALLOW TECHNIQUE: Single contrast examination was performed using thin barium and a 12.5 mm diameter barium tablet. FLUOROSCOPY TIME:  Fluoroscopy Time:  1 minutes 36 seconds Radiation Exposure Index (if provided by the fluoroscopic device): 20.7 mGy Number of Acquired Spot Images: multiple fluoroscopic screen captures COMPARISON:  None FINDINGS: Esophageal distention: Normal without mass or stricture. 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 Filling defects: Tiny anterior wall cervical esophageal web, nonobstructive. 12.5 mm barium tablet: Passed from oral cavity to stomach without  obstruction Motility: Moderate diffuse esophageal dysmotility with incomplete clearance of barium by primary peristaltic waves. Weak secondary and scattered tertiary waves were seen. Prolonged thoracic esophageal retention of contrast. Mucosa:  Smooth without irregularity or ulceration Hypopharynx/cervical esophagus: Laryngeal penetration without witnessed aspiration. However, when the patient was drinking water to swallow the 12.5 mm diameter barium tablet, she experience multiple episodes of spontaneous coughing, suspect unwitnessed aspiration. 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 Laryngeal penetration without aspiration though I suspect patient had multiple episodes of unwitnessed aspiration when swallowing the 12.5 mm diameter barium tablet with water. Electronically Signed   By: Lavonia Dana M.D.   On: 09/11/2020 09:49         Discharge Exam: Vitals:   09/12/20 0407 09/12/20 0741  BP: (!) 116/92 (!) 130/102  Pulse: (!) 126 (!) 114  Resp: 16 19  Temp: 97.9 F (36.6 C) (!) 97.5 F (36.4 C)  SpO2: 96% 95%   Vitals:   09/12/20 0042 09/12/20 0407 09/12/20 0415 09/12/20 0741  BP: 109/83 (!) 116/92  (!) 130/102  Pulse: (!) 106 (!) 126  (!) 114  Resp: 16 16  19   Temp: 98.1 F (36.7 C) 97.9 F (36.6 C)  (!) 97.5 F (36.4 C)  TempSrc:  Oral    SpO2: 94% 96%  95%  Weight:   51.8 kg   Height:        General: Pt is alert, awake, not in acute distress Cardiovascular: IRRR, S1/S2 +, no rubs, no gallops Respiratory: CTA bilaterally, no wheezing, no rhonchi Abdominal: Soft, NT, ND, bowel sounds + Extremities: no edema, no cyanosis   The results of significant diagnostics from this hospitalization (including imaging, microbiology, ancillary and laboratory) are listed below for reference.    Significant Diagnostic  Studies: DG Lumbar Spine 2-3 Views  Result Date: 08/18/2020 CLINICAL DATA:  79 year old female with low back pain. EXAM: LUMBAR SPINE - 2-3 VIEW COMPARISON:  Lumbar spine radiograph dated 07/31/2015. FINDINGS: There is no acute fracture or subluxation of the lumbar spine. There is osteopenia with degenerative changes and lumbar levoscoliosis. Multilevel facet arthropathy. There is atherosclerotic calcification of the aorta. The soft tissues are unremarkable. IMPRESSION: No acute fracture or subluxation of the lumbar spine. Electronically Signed   By: Anner Crete M.D.   On: 08/18/2020 22:16   CT ABDOMEN PELVIS W CONTRAST  Result Date: 09/07/2020 CLINICAL DATA:  Abdominal pain, lower back pain for 3 months. EXAM: CT ABDOMEN AND PELVIS WITH CONTRAST TECHNIQUE: Multidetector CT imaging of the abdomen and pelvis was performed using the standard protocol following bolus administration of intravenous contrast. CONTRAST:  27mL OMNIPAQUE IOHEXOL 300 MG/ML  SOLN COMPARISON:  CT abdomen pelvis dated 07/10/2019 and  other prior exams. FINDINGS: Lower chest: There are small bilateral pleural effusions with associated atelectasis. Hepatobiliary: Two hepatic cysts are redemonstrated. There is a gallbladder wall thickening with surrounding edema. No definite gallstones are identified. No biliary dilatation. Pancreas: A 6 mm low attenuating mass in the pancreatic tail is not significantly changed since at least 11/28/2012 and presumed benign. No pancreatic ductal dilatation or surrounding inflammatory changes. Spleen: Normal in size without focal abnormality. Adrenals/Urinary Tract: A 1.5 cm lipid rich adrenal adenoma in the right is unchanged and benign. The left adrenal gland appears normal. Kidneys are normal, without renal calculi, focal lesion, or hydronephrosis. Bladder is unremarkable. Stomach/Bowel: Stomach is within normal limits. Appendix appears normal. There is colonic diverticulosis without evidence of  diverticulitis. No evidence of bowel wall thickening, distention, or inflammatory changes. Vascular/Lymphatic: Aortic atherosclerosis. No enlarged abdominal or pelvic lymph nodes. Reproductive: Status post hysterectomy. No adnexal masses. Other: There is moderate volume free intraperitoneal fluid. No abdominal wall hernia is identified. Musculoskeletal: Degenerative changes are seen in the spine. IMPRESSION: 1. Gallbladder wall thickening with surrounding edema may represent acute cholecystitis. Right upper quadrant ultrasound could be performed if the clinically indicated. 2. Moderate volume free intraperitoneal fluid. 3. Small bilateral pleural effusions with associated atelectasis. Aortic Atherosclerosis (ICD10-I70.0). Electronically Signed   By: Zerita Boers M.D.   On: 09/07/2020 10:37   DG Chest Portable 1 View  Result Date: 09/07/2020 CLINICAL DATA:  Pt states she has been having BP issues onset for several days. Pt denies sob, cp, fever, or N/V. No COVID test ordered at this time. HX of hypertension. EXAM: PORTABLE CHEST 1 VIEW COMPARISON:  10/26/2017.  CT, 11/23/2017. FINDINGS: Heart mildly enlarged.  No mediastinal or hilar masses. Lungs demonstrate vascular prominence. There is interstitial thickening and linear areas of scarring or atelectasis at the lung bases, greater on the left. Additional left lung base opacity obscures hemidiaphragm, which may reflect a pleural effusion in addition to the lung base opacity. No convincing pulmonary edema. No pneumothorax. Skeletal structures are grossly intact. IMPRESSION: 1. Left lung base opacity that is likely a combination of a small effusion and atelectasis. Consider pneumonia if there are consistent clinical findings. 2. Vascular congestion and mild lower lung interstitial thickening without convincing pulmonary edema. Electronically Signed   By: Lajean Manes M.D.   On: 09/07/2020 09:37   ECHOCARDIOGRAM COMPLETE  Result Date: 09/09/2020     ECHOCARDIOGRAM REPORT   Patient Name:   Norma Barajas Date of Exam: 09/09/2020 Medical Rec #:  [id number]   Height:       61.0 in Accession #:    [id number]  Weight:       120.0 lb Date of Birth:  [birth year]   BSA:          1.520 m Patient Age:    63 years    BP:           145/112 mmHg Patient Gender: F           HR:           100 bpm. Exam Location:  [facility] Procedure: 2D Echo, Cardiac Doppler and Color Doppler Indications:    Congestive Heart Failure 428.0 / I50.9  History:        Patient has prior history of Echocardiogram examinations, most                 recent 04/11/2015. Arrythmias:Atrial Fibrillation,  Signs/Symptoms:Murmur; Risk Factors:Hypertension and                 Dyslipidemia. Chronic anticoagulation.  Sonographer:    Alvino Chapel RCS Referring Phys: [place]  1. Left ventricular ejection fraction, by estimation, is 50 to 55%. The left ventricle has low normal function. The left ventricle has no regional wall motion abnormalities. Left ventricular diastolic parameters are indeterminate.  2. Ventricular septum is flattened in systole and diastole suggesting RV pressure and volume overload. . Right ventricular systolic function is moderately reduced. The right ventricular size is moderately enlarged.  3. Left atrial size was severely dilated.  4. Right atrial size was severely dilated.  5. The mitral valve is normal in structure. Mild to moderate mitral valve regurgitation. No evidence of mitral stenosis.  6. The tricuspid valve is abnormal. Tricuspid valve regurgitation is severe.  7. The aortic valve is tricuspid. Aortic valve regurgitation is mild.  8. The inferior vena cava is dilated in size with <50% respiratory variability, suggesting right atrial pressure of 15 mmHg. FINDINGS  Left Ventricle: Left ventricular ejection fraction, by estimation, is 50 to 55%. The left ventricle has low normal function. The left ventricle has no regional wall motion  abnormalities. The left ventricular internal cavity size was normal in size. There is no left ventricular hypertrophy. Left ventricular diastolic parameters are indeterminate. Right Ventricle: Ventricular septum is flattened in systole and diastole suggesting RV pressure and volume overload. The right ventricular size is moderately enlarged. Right vetricular wall thickness was not assessed. Right ventricular systolic function is moderately reduced. Left Atrium: Left atrial size was severely dilated. Right Atrium: Right atrial size was severely dilated. Pericardium: There is no evidence of pericardial effusion. Mitral Valve: The mitral valve is normal in structure. Mild to moderate mitral valve regurgitation. No evidence of mitral valve stenosis. Tricuspid Valve: The tricuspid valve is abnormal. Tricuspid valve regurgitation is severe. No evidence of tricuspid stenosis. Aortic Valve: The aortic valve is tricuspid. Aortic valve regurgitation is mild. Aortic regurgitation PHT measures 450 msec. Aortic valve mean gradient measures 2.6 mmHg. Aortic valve peak gradient measures 4.8 mmHg. Aortic valve area, by VTI measures 1.38 cm. Pulmonic Valve: The pulmonic valve was not well visualized. Pulmonic valve regurgitation is not visualized. No evidence of pulmonic stenosis. Aorta: The aortic root is normal in size and structure. Pulmonary Artery: Mild pulmonary HTN, PASP is 35 mmHg. Venous: The inferior vena cava is dilated in size with less than 50% respiratory variability, suggesting right atrial pressure of 15 mmHg. IAS/Shunts: The interatrial septum was not well visualized.  LEFT VENTRICLE PLAX 2D LVIDd:         3.73 cm LVIDs:         2.80 cm LV PW:         0.81 cm LV IVS:        0.87 cm LVOT diam:     1.60 cm LV SV:         25 LV SV Index:   16 LVOT Area:     2.01 cm  LV Volumes (MOD) LV vol d, MOD A2C: 44.7 ml LV vol d, MOD A4C: 39.9 ml LV vol s, MOD A2C: 26.7 ml LV vol s, MOD A4C: 18.1 ml LV SV MOD A2C:     18.0 ml  LV SV MOD A4C:     39.9 ml LV SV MOD BP:      19.1 ml RIGHT VENTRICLE RV S prime:  8.00 cm/s TAPSE (M-mode): 1.4 cm LEFT ATRIUM             Index       RIGHT ATRIUM           Index LA diam:        3.80 cm 2.50 cm/m  RA Area:     23.40 cm LA Vol (A2C):   48.7 ml 32.04 ml/m RA Volume:   74.60 ml  49.07 ml/m LA Vol (A4C):   70.8 ml 46.57 ml/m LA Biplane Vol: 59.9 ml 39.40 ml/m  AORTIC VALVE AV Area (Vmax):    1.40 cm AV Area (Vmean):   1.32 cm AV Area (VTI):     1.38 cm AV Vmax:           109.68 cm/s AV Vmean:          76.432 cm/s AV VTI:            0.178 m AV Peak Grad:      4.8 mmHg AV Mean Grad:      2.6 mmHg LVOT Vmax:         76.30 cm/s LVOT Vmean:        50.350 cm/s LVOT VTI:          0.122 m LVOT/AV VTI ratio: 0.69 AI PHT:            450 msec  AORTA Ao Root diam: 3.10 cm MITRAL VALVE                 TRICUSPID VALVE MV Area (PHT): 5.16 cm      TR Peak grad:   19.9 mmHg MV Decel Time: 147 msec      TR Vmax:        223.00 cm/s MR Peak grad:    117.1 mmHg MR Mean grad:    72.0 mmHg   SHUNTS MR Vmax:         541.00 cm/s Systemic VTI:  0.12 m MR Vmean:        392.0 cm/s  Systemic Diam: 1.60 cm MR PISA:         0.25 cm MR PISA Eff ROA: 1 mm MR PISA Radius:  0.20 cm MV E velocity: 86.30 cm/s Carlyle Dolly MD Electronically signed by Carlyle Dolly MD Signature Date/Time: 09/09/2020/1:13:32 PM    Final    US Abdomen Limited RUQ (LIVER/GB)  Result Date: 09/08/2020 CLINICAL DATA:  Right upper quadrant pain. EXAM: ULTRASOUND ABDOMEN LIMITED RIGHT UPPER QUADRANT COMPARISON:  CT abdomen pelvis 09/07/2020 FINDINGS: Gallbladder: Gallbladder wall thickness upper limits of normal. Trace pericholecystic fluid. Negative sonographic Murphy sign. Possible 4 mm gallbladder polyp. Common bile duct: Diameter: 5.7 mm. Liver: No focal lesion identified. Within normal limits in parenchymal echogenicity. Questioned mass adjacent to the liver likely represents small adenoma demonstrated on prior CT. Portal vein is patent  on color Doppler imaging with normal direction of blood flow towards the liver. Other: Small right pleural effusion. Enlarged IVC and hepatic veins. IMPRESSION: 1. No cholelithiasis. Gallbladder wall thickness upper limits of normal with trace pericholecystic fluid. Negative sonographic Murphy sign. 2. Enlarged IVC and hepatic veins suggesting some component of right heart failure. Electronically Signed   By: Lovey Newcomer M.D.   On: 09/08/2020 10:16   DG ESOPHAGUS W SINGLE CM (SOL OR THIN BA)  Result Date: 09/11/2020 CLINICAL DATA:  Dysphagia for liquids, solids and pills, remote esophageal dilatation 2008 EXAM: ESOPHOGRAM/BARIUM SWALLOW TECHNIQUE: Single contrast examination was performed using thin  barium and a 12.5 mm diameter barium tablet. FLUOROSCOPY TIME:  Fluoroscopy Time:  1 minutes 36 seconds Radiation Exposure Index (if provided by the fluoroscopic device): 20.7 mGy Number of Acquired Spot Images: multiple fluoroscopic screen captures COMPARISON:  None FINDINGS: Esophageal distention: Normal without mass or stricture. 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 Filling defects: Tiny anterior wall cervical esophageal web, nonobstructive. 12.5 mm barium tablet: Passed from oral cavity to stomach without obstruction Motility: Moderate diffuse esophageal dysmotility with incomplete clearance of barium by primary peristaltic waves. Weak secondary and scattered tertiary waves were seen. Prolonged thoracic esophageal retention of contrast. Mucosa:  Smooth without irregularity or ulceration Hypopharynx/cervical esophagus: Laryngeal penetration without witnessed aspiration. However, when the patient was drinking water to swallow the 12.5 mm diameter barium tablet, she experience multiple episodes of spontaneous coughing, suspect unwitnessed aspiration. 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 Laryngeal penetration without aspiration though I suspect patient had multiple episodes of unwitnessed aspiration when swallowing the 12.5 mm diameter barium tablet with water. Electronically Signed   By: Lavonia Dana M.D.   On: 09/11/2020 09:49     Microbiology: Recent Results (from the past 240 hour(s))  Respiratory Panel by RT PCR (Flu A&B, Covid) - Nasopharyngeal Swab     Status: None   Collection Time: 09/07/20 10:04 AM   Specimen: Nasopharyngeal Swab  Result Value Ref Range Status   SARS Coronavirus 2 by RT PCR NEGATIVE NEGATIVE Final    Comment: (NOTE) SARS-CoV-2 target nucleic acids are NOT DETECTED.  The SARS-CoV-2 RNA is generally detectable in upper respiratoy specimens during the acute phase of infection. The lowest concentration of SARS-CoV-2 viral copies this assay can detect is 131 copies/mL. A negative result does not preclude SARS-Cov-2 infection and should not be used as the sole basis for treatment or other patient management decisions. A negative result may occur with  improper specimen collection/handling, submission of specimen other than nasopharyngeal swab, presence of viral mutation(s) within the areas targeted by this assay, and inadequate number of viral copies (<131 copies/mL). A negative result must be combined with clinical observations, patient history, and epidemiological information. The expected result is Negative.  Fact Sheet for Patients:  PinkCheek.be  Fact Sheet for Healthcare Providers:  GravelBags.it  This test is no t yet approved or cleared by the [place] FDA and  has been authorized for detection and/or diagnosis of SARS-CoV-2 by FDA under an Emergency Use Authorization (EUA). This EUA will remain  in effect (meaning this test can be used) for the duration of the COVID-19 declaration under Section 564(b)(1) of the Act, 21  U.S.C. section 360bbb-3(b)(1), unless the authorization is terminated or revoked sooner.     Influenza A by PCR NEGATIVE NEGATIVE Final   Influenza B by PCR NEGATIVE NEGATIVE Final    Comment: (NOTE) The Xpert Xpress SARS-CoV-2/FLU/RSV assay is intended as an aid in  the diagnosis of influenza from Nasopharyngeal swab specimens and  should not be used as a sole basis for treatment. Nasal washings and  aspirates are unacceptable for Xpert Xpress SARS-CoV-2/FLU/RSV  testing.  Fact Sheet for Patients: PinkCheek.be  Fact Sheet for Healthcare Providers: GravelBags.it  This test is not yet approved or cleared by the [place] FDA and  has been authorized for detection and/or diagnosis of SARS-CoV-2 by  FDA under an Emergency Use Authorization (EUA). This EUA will remain  in  effect (meaning this test can be used) for the duration of the  Covid-19 declaration under Section 564(b)(1) of the Act, 21  U.S.C. section 360bbb-3(b)(1), unless the authorization is  terminated or revoked. Performed at [facility], [address], [place], [place] [address]   Culture, Urine     Status: None   Collection Time: 09/10/20 11:13 AM   Specimen: Urine, Clean Catch  Result Value Ref Range Status   Specimen Description   Final    URINE, CLEAN CATCH Performed at [facility], [address] [place], [place] [address]    Special Requests   Final    NONE Performed at [facility], [address] [place], [place] [address]    Culture   Final    NO GROWTH Performed at [facility], [place] [address] [place], [place] [address]    Report Status 09/11/2020 FINAL  Final     Labs: Basic Metabolic Panel: Recent Labs  Lab 09/07/20 0914 09/07/20 1038 09/08/20 0424 09/08/20 0424 09/09/20 0553 09/09/20 0553 09/10/20 0442 09/10/20 0442 09/11/20 0439 09/12/20 0625  NA   < >  --  138  --  139  --  139  --  138 138  K   < >  --  4.2    < > 3.2*   < > 3.1*   < > 3.1* 3.2*  CL   < >  --  101  --  98  --  95*  --  95* 97*  CO2   < >  --  24  --  28  --  29  --  30 30  GLUCOSE   < >  --  72  --  85  --  112*  --  107* 94  BUN   < >  --  13  --  14  --  13  --  15 20  CREATININE   < >  --  0.86  --  0.97  --  0.98  --  1.06* 1.04*  CALCIUM   < >  --  8.2*  --  8.7*  --  8.7*  --  8.4* 8.5*  MG  --  1.7  --   --   --   --   --   --  1.9 1.7   < > = values in this interval not displayed.   Liver Function Tests: Recent Labs  Lab 09/07/20 0914 09/08/20 0424 09/09/20 0553 09/11/20 0439  AST 29 26 28  35  ALT 19 18 18 20   ALKPHOS 66 52 56 63  BILITOT 1.3* 1.0 1.3* 1.0  PROT 6.6 5.7* 6.2* 6.4*  ALBUMIN 3.6 3.1* 3.4* 3.5   Recent Labs  Lab 09/07/20 0914  LIPASE 24   No results for input(s): AMMONIA in the last 168 hours. CBC: Recent Labs  Lab 09/07/20 0914 09/07/20 0914 09/08/20 0424 09/09/20 0553 09/10/20 0442 09/11/20 0439 09/12/20 0625  WBC 6.1   < > 4.8 6.1 5.8 5.9 5.0  NEUTROABS 4.5  --   --   --   --   --   --   HGB 16.1*   < > 14.8 15.1* 15.8* 15.5* 14.7  HCT 50.1*   < > 46.2* 47.4* 48.6* 49.0* 46.3*  MCV 95.4   < > 96.3 96.3 94.7 97.0 96.5  PLT 183   < > 173 177 184 184 157   < > = values in this interval not displayed.  Cardiac Enzymes: No results for input(s): CKTOTAL, CKMB, CKMBINDEX, TROPONINI in the last 168 hours. BNP: Invalid input(s): POCBNP CBG: No results for input(s): GLUCAP in the last 168 hours.  Time coordinating discharge:  36 minutes  Signed:  Orson Eva, DO [facility] Pager: [phone] 09/12/2020, 11:48 AM

## 2020-09-15 ENCOUNTER — Other Ambulatory Visit: Payer: Self-pay | Admitting: Family Medicine

## 2020-09-15 ENCOUNTER — Ambulatory Visit (INDEPENDENT_AMBULATORY_CARE_PROVIDER_SITE_OTHER): Payer: Medicare HMO | Admitting: Gastroenterology

## 2020-09-15 DIAGNOSIS — I482 Chronic atrial fibrillation, unspecified: Secondary | ICD-10-CM

## 2020-09-16 ENCOUNTER — Encounter: Payer: Self-pay | Admitting: Nurse Practitioner

## 2020-09-16 ENCOUNTER — Ambulatory Visit (INDEPENDENT_AMBULATORY_CARE_PROVIDER_SITE_OTHER): Payer: Medicare HMO | Admitting: Nurse Practitioner

## 2020-09-16 ENCOUNTER — Other Ambulatory Visit: Payer: Self-pay

## 2020-09-16 VITALS — BP 132/74 | HR 80 | Temp 98.0°F | Resp 20

## 2020-09-16 DIAGNOSIS — I4891 Unspecified atrial fibrillation: Secondary | ICD-10-CM | POA: Diagnosis not present

## 2020-09-16 DIAGNOSIS — E876 Hypokalemia: Secondary | ICD-10-CM | POA: Diagnosis not present

## 2020-09-16 DIAGNOSIS — Z7901 Long term (current) use of anticoagulants: Secondary | ICD-10-CM

## 2020-09-16 DIAGNOSIS — I482 Chronic atrial fibrillation, unspecified: Secondary | ICD-10-CM

## 2020-09-16 DIAGNOSIS — Z7689 Persons encountering health services in other specified circumstances: Secondary | ICD-10-CM

## 2020-09-16 LAB — CBC WITH DIFFERENTIAL/PLATELET
Basophils Absolute: 0.1 10*3/uL (ref 0.0–0.2)
Basos: 1 %
EOS (ABSOLUTE): 0.1 10*3/uL (ref 0.0–0.4)
Eos: 1 %
Hematocrit: 50.1 % — ABNORMAL HIGH (ref 34.0–46.6)
Hemoglobin: 15.8 g/dL (ref 11.1–15.9)
Immature Grans (Abs): 0 10*3/uL (ref 0.0–0.1)
Immature Granulocytes: 1 %
Lymphocytes Absolute: 1.4 10*3/uL (ref 0.7–3.1)
Lymphs: 22 %
MCH: 30.5 pg (ref 26.6–33.0)
MCHC: 31.5 g/dL (ref 31.5–35.7)
MCV: 97 fL (ref 79–97)
Monocytes Absolute: 0.7 10*3/uL (ref 0.1–0.9)
Monocytes: 10 %
Neutrophils Absolute: 4.4 10*3/uL (ref 1.4–7.0)
Neutrophils: 65 %
Platelets: 161 10*3/uL (ref 150–450)
RBC: 5.18 x10E6/uL (ref 3.77–5.28)
RDW: 15.9 % — ABNORMAL HIGH (ref 11.7–15.4)
WBC: 6.7 10*3/uL (ref 3.4–10.8)

## 2020-09-16 LAB — COAGUCHEK XS/INR WAIVED
INR: 1.2 — ABNORMAL HIGH (ref 0.9–1.1)
Prothrombin Time: 14.7 s

## 2020-09-16 LAB — BMP8+EGFR
BUN/Creatinine Ratio: 14 (ref 12–28)
BUN: 19 mg/dL (ref 8–27)
CO2: 26 mmol/L (ref 20–29)
Calcium: 9.2 mg/dL (ref 8.7–10.3)
Chloride: 99 mmol/L (ref 96–106)
Creatinine, Ser: 1.36 mg/dL — ABNORMAL HIGH (ref 0.57–1.00)
GFR calc Af Amer: 42 mL/min/{1.73_m2} — ABNORMAL LOW (ref >59)
GFR calc non Af Amer: 37 mL/min/{1.73_m2} — ABNORMAL LOW (ref >59)
Glucose: 125 mg/dL — ABNORMAL HIGH (ref 65–99)
Potassium: 4.3 mmol/L (ref 3.5–5.2)
Sodium: 141 mmol/L (ref 134–144)

## 2020-09-16 MED ORDER — WARFARIN SODIUM 5 MG PO TABS: 5.0000 mg | ORAL_TABLET | Freq: Every day | ORAL | 2 refills | Status: DC

## 2020-09-16 MED ORDER — WARFARIN SODIUM 3 MG PO TABS: ORAL_TABLET | ORAL | 3 refills | Status: DC

## 2020-09-16 MED ORDER — DICLOFENAC SODIUM 1 % EX GEL: 2.0000 g | Freq: Four times a day (QID) | CUTANEOUS | 2 refills | Status: DC

## 2020-09-16 NOTE — Assessment & Plan Note (Signed)
Patient following up for coordination of transitional care.  Completed hospital discharge instructions with medication reconciliation.  Assessment completed patient is reporting increased weakness.  Patient reports falling on Sunday due to decreased energy and not being able to hold up her weight.  Patient is also reporting increased back pain with no relief from Tylenol.   Provided education with printed handouts given patient verbalized understanding.  Started patient on Tylenol for pain, Voltaren gel, and warm compress.  Follow-up with worsening or unresolved symptoms.

## 2020-09-16 NOTE — Progress Notes (Signed)
Established Patient Office Visit  Subjective:  Patient ID: Norma Barajas, female    DOB: 02-22-1941  Age: 79 y.o. MRN: [id number]  CC:  Chief Complaint  Patient presents with   Transitions Of Care   Coagulation Disorder     Subjective:     Indication: atrial fibrillation Bleeding signs/symptoms: None Thromboembolic signs/symptoms: None  Missed Coumadin doses: None  Medication changes: no Dietary changes: no Bacterial/viral infection: no Other concerns: no  The following portions of the patient's history were reviewed and updated as appropriate: allergies, current medications, past family history, past medical history, past social history, past surgical history and problem list.  Review of Systems Pertinent items are noted in HPI.   Objective:    INR Today: 1.2 Current dose: 3 mg     Assessment:    Subtherapeutic INR for goal of 2-3   Plan:    1. New dose: 5 mg  Monday /Tuesday/ W/TH/F 2. Next INR: 1 week   HPI Norma Barajas presents for Today's visit was for Transitional Care Management.  The patient was discharged from [facility] on 09/12/20 with a primary diagnosis of Dysphagia, chronic atrial fibrillation, acute diastolic CHF right-sided, hyperlipidemia, hypokalemia, hypertension.  Contact with the patient and/or caregiver, by a clinical staff member, was made on 09/12/2020 and was documented as a telephone encounter within the EMR.  Through chart review and discussion with the patient I have determined that management of their condition is of high complexity.           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 Nonobstructive minimal CAD 10/2005.   Depression    Diastolic dysfunction    Grade 1. Ejection fraction  60-65%.   Dysrhythmia    a fib   Erosive esophagitis    Essential hypertension    GERD (gastroesophageal reflux disease)    Grade III hemorrhoids    History of hiatal hernia    Hypercholesterolemia    Internal hemorrhoids with complication 9/89/2119   OCT 2015 FLEX SIG/IH BANDING     Migraine    "used to have them right bad; I don't now" (04/11/2015)   MITRAL REGURGITATION 04/27/2010   Qualifier: Diagnosis of  By: [name], MD, [name]    Osteoporosis    Paroxysmal atrial fibrillation ([place])    PONV (postoperative nausea and vomiting)    Vertigo     Past Surgical History:  Procedure Laterality Date   BRAVO [facility] STUDY  11/15/2012   Procedure: BRAVO [place];  Surgeon: [name], MD;  [facility];  Service: Endoscopy;;   CARDIOVERSION N/A 05/16/2020   Procedure: CARDIOVERSION;  Surgeon: Donato Heinz, MD;  Location: [place];  Service: Cardiovascular;  Laterality: N/A;   CATARACT EXTRACTION W/ INTRAOCULAR LENS  IMPLANT, BILATERAL Bilateral    COLONOSCOPY  2008   Dr. [name]: internal hemorrhoids    DILATION AND CURETTAGE OF UTERUS     ESOPHAGOGASTRODUODENOSCOPY (EGD) WITH ESOPHAGEAL DILATION  2001   Dr. [name]: erosive esophagitis, esophageal stricture, duodenitis, s/p Savary dilation   ESOPHAGOGASTRODUODENOSCOPY (EGD) WITH ESOPHAGEAL DILATION  11/15/2012   ERD:EYCXKGYJEH web was found & MOST LIKELY CAUSE FOR DYAPHAGIA/Polyp was found in the gastric body and  gastric fundus/ gastritis on bx   EYE SURGERY Bilateral    "laser OR after cataract OR; cause I couldn't see"   FLEXIBLE SIGMOIDOSCOPY N/A 08/22/2014   mild diverticulosis in sigmoid, moderate sized Grade 3 hemorrhoids s/p banding X 3.    FRACTURE SURGERY Right    below the knee - 2 bones broke has plates   HEMORRHOID BANDING N/A 08/22/2014   Procedure: HEMORRHOID BANDING;  Surgeon: [name], MD;  [facility];  Service: Endoscopy;  Laterality: N/A;    HEMORRHOID SURGERY N/A 03/30/2018   Procedure: EXTENSIVE HEMORRHOIDECTOMY;  Surgeon: [name], MD;  Location: [facility] ORS;  Service: General;  Laterality: N/A;   OPEN REDUCTION INTERNAL FIXATION (ORIF) TIBIA/FIBULA FRACTURE Right 2013   broke tibia and fibula after falling down stairs   PROLAPSED UTERINE FIBROID LIGATION  2015   TOTAL ABDOMINAL HYSTERECTOMY      Family History  Problem Relation Age of Onset   Colon cancer Mother 17   Heart disease Mother    Osteoporosis Mother    Hip fracture Mother    Stroke Sister    Diabetes Sister    Osteoporosis Sister    Arthritis Sister    Uterine cancer Sister    Stroke Sister    Heart disease Father    Hyperlipidemia Brother    Hypertension Brother    Heart disease Brother    Stroke Brother    Heart disease Sister    Dementia Sister    Diabetes Son    Stroke Son    Heart attack Neg Hx     Social History   Socioeconomic History   Marital status: Married    Spouse name: george    Number of children: 1   Years of education: Not on file   Highest education level: Not on file  Occupational History   Occupation: Retired    Comment: Textile  Tobacco Use   Smoking status: Never Smoker   Smokeless tobacco: Never Used  Scientific laboratory technician Use: Never used  Substance and Sexual Activity   Alcohol use: No    Alcohol/week: 0.0 standard drinks   Drug use: No   Sexual activity: Not Currently    Birth control/protection: Surgical, Post-menopausal    Comment: hyst  Other Topics Concern   Not on file  Social History Narrative   Married   No regular exercise   Social Determinants of Health   Financial Resource Strain:    Difficulty of Paying Living Expenses: Not on file  Food Insecurity:    Worried About Charity fundraiser in the Last Year: Not on file   [employer] of Food in the Last Year: Not on file  Transportation Needs:    Lack of Transportation (Medical): Not on file   Lack of  Transportation (Non-Medical): Not on file  Physical Activity:    Days of Exercise per Week: Not on file   Minutes of Exercise per Session: Not on file  Stress:    Feeling of Stress : Not on file  Social Connections:    Frequency of Communication with Friends and Family: Not on file   Frequency of Social Gatherings with Friends and Family: Not on file   Attends Religious Services: Not on file   Active Member of Clubs or Organizations: Not on file   Attends Archivist Meetings: Not on file   Marital Status: Not on file  Intimate Partner Violence:    Fear of  Current or Ex-Partner: Not on file   Emotionally Abused: Not on file   Physically Abused: Not on file   Sexually Abused: Not on file    Outpatient Medications Prior to Visit  Medication Sig Dispense Refill   acetaminophen (TYLENOL) 500 MG tablet Take 1,000 mg by mouth 2 (two) times daily.      amiodarone (PACERONE) 200 MG tablet Take 1 tablet (200 mg total) by mouth daily. 90 tablet 1   amLODipine (NORVASC) 10 MG tablet TAKE 1 TABLET EVERY DAY (Patient taking differently: Take 10 mg by mouth daily. ) 90 tablet 0   cholecalciferol (VITAMIN D3) 25 MCG (1000 UT) tablet Take 1,000 Units by mouth daily.     loratadine (CLARITIN) 10 MG tablet Take 10 mg by mouth daily as needed for allergies.     metoprolol tartrate (LOPRESSOR) 100 MG tablet Take 1 tablet (100 mg total) by mouth 2 (two) times daily. 60 tablet 1   Multiple Vitamin (MULTIVITAMIN) capsule Take 1 capsule by mouth every evening.     Omega-3 Fatty Acids (FISH OIL) 1000 MG CAPS Take 1,000 mg by mouth 2 (two) times daily.      omeprazole (PRILOSEC) 20 MG capsule Take 1 capsule (20 mg total) by mouth daily. 90 capsule 3   potassium chloride (KLOR-CON) 10 MEQ tablet Take 3 tablets (30 mEq total) by mouth daily with breakfast. 60 tablet 3   simvastatin (ZOCOR) 20 MG tablet Take 1 tablet (20 mg total) by mouth daily. 90 tablet 1   torsemide  (DEMADEX) 20 MG tablet Take 1 tablet (20 mg total) by mouth daily. 30 tablet 1   warfarin (COUMADIN) 3 MG tablet TAKE 1/2 TO 1 TABLET EVERY DAY AS DIRECTED BY  ANTICOAGULATION CLINIC 90 tablet 0   No facility-administered medications prior to visit.    Allergies  Allergen Reactions   Codeine Nausea And Vomiting   Doxycycline Other (See Comments)    Chest congestion   Triamterene-Hctz Other (See Comments)    weakness   Atorvastatin Other (See Comments)    Myalgias    Crestor [Rosuvastatin Calcium] Other (See Comments)    weakness   Morphine Nausea Only   Risedronate Sodium Other (See Comments)    ACTONEL - reflux    ROS Review of Systems  Musculoskeletal: Positive for back pain.  Neurological: Positive for weakness.  All other systems reviewed and are negative.     Objective:    Physical Exam Vitals reviewed.  HENT:     Head: Normocephalic.  Eyes:     Conjunctiva/sclera: Conjunctivae normal.  Cardiovascular:     Comments: Chronic A. fib Pulmonary:     Effort: Pulmonary effort is normal.     Breath sounds: Normal breath sounds.  Abdominal:     General: Bowel sounds are normal.     Tenderness: There is no abdominal tenderness.  Musculoskeletal:        General: Tenderness present.     Cervical back: No tenderness.  Skin:    General: Skin is warm.  Neurological:     Mental Status: She is alert and oriented to person, place, and time.  Psychiatric:        Mood and Affect: Mood normal.     BP 132/74    Pulse 80    Temp 98 F (36.7 C) (Temporal)    Resp 20    SpO2 95%  Wt Readings from Last 3 Encounters:  09/12/20 114 lb 3.2 oz (51.8 kg)  09/04/20 121  lb 2 oz (54.9 kg)  08/27/20 118 lb (53.5 kg)     There are no preventive care reminders to display for this patient.  There are no preventive care reminders to display for this patient.  Lab Results  Component Value Date   TSH 0.805 05/20/2020   Lab Results  Component Value Date   WBC 5.0  09/12/2020   HGB 14.7 09/12/2020   HCT 46.3 (H) 09/12/2020   MCV 96.5 09/12/2020   PLT 157 09/12/2020   Lab Results  Component Value Date   NA 138 09/12/2020   K 3.2 (L) 09/12/2020   CO2 30 09/12/2020   GLUCOSE 94 09/12/2020   BUN 20 09/12/2020   CREATININE 1.04 (H) 09/12/2020   BILITOT 1.0 09/11/2020   ALKPHOS 63 09/11/2020   AST 35 09/11/2020   ALT 20 09/11/2020   PROT 6.4 (L) 09/11/2020   ALBUMIN 3.5 09/11/2020   CALCIUM 8.5 (L) 09/12/2020   ANIONGAP 11 09/12/2020   Lab Results  Component Value Date   CHOL 132 08/05/2020   Lab Results  Component Value Date   HDL 39 (L) 08/05/2020   Lab Results  Component Value Date   LDLCALC 70 08/05/2020   Lab Results  Component Value Date   TRIG 128 08/05/2020   Lab Results  Component Value Date   CHOLHDL 3.4 08/05/2020   Lab Results  Component Value Date   HGBA1C 5.6 02/28/2017      Assessment & Plan:   Problem List Items Addressed This Visit      Cardiovascular and Mediastinum   Atrial fibrillation ([place])    INR not  therapeutic on current warfarin dose.  Adjustments made to medication follow-up in 1 week.      Relevant Medications   warfarin (COUMADIN) 3 MG tablet     Other   Encounter for support and coordination of transition of care    Patient following up for coordination of transitional care.  Completed hospital discharge instructions with medication reconciliation.  Assessment completed patient is reporting increased weakness.  Patient reports falling on Sunday due to decreased energy and not being able to hold up her weight.  Patient is also reporting increased back pain with no relief from Tylenol.   Provided education with printed handouts given patient verbalized understanding.  Started patient on Tylenol for pain, Voltaren gel, and warm compress.  Follow-up with worsening or unresolved symptoms.       Chronic anticoagulation - Primary    Completed INR.  Increase warfarin dose today, take 3  tablets today and, resume regular dose 3 mg next 6 days follow-up in 1 week.  Provided education with printed handouts given to patient.        Relevant Orders   CoaguChek XS/INR Waived (Completed)   Hypokalemia    Labs completed results pending      Relevant Orders   BMP8+EGFR   CBC with Differential/Platelet      Meds ordered this encounter  Medications   DISCONTD: warfarin (COUMADIN) 5 MG tablet    Sig: Take 1 tablet (5 mg total) by mouth daily.    Dispense:  30 tablet    Refill:  2    Order Specific Question:   Supervising Provider    Answer:   Caryl Pina A [[id number]]   diclofenac Sodium (VOLTAREN) 1 % GEL    Sig: Apply 2 g topically 4 (four) times daily.    Dispense:  50 g    Refill:  2  Order Specific Question:   Supervising Provider    Answer:   Caryl Pina A [[id number]]   warfarin (COUMADIN) 3 MG tablet    Sig: Take 1/2 to 1 tablet every day as directed    Dispense:  30 tablet    Refill:  3    Order Specific Question:   Supervising Provider    Answer:   Caryl Pina A [[id number]]    Follow-up: Return in about 1 week (around 09/23/2020).    Ivy Lynn, NP

## 2020-09-16 NOTE — Assessment & Plan Note (Signed)
INR not  therapeutic on current warfarin dose.  Adjustments made to medication follow-up in 1 week.

## 2020-09-16 NOTE — Patient Instructions (Signed)
Prothrombin Time, International Normalized Ratio Test Why am I having this test? A prothrombin time (pro-time, PT) test may be ordered if:  You have certain medical conditions that cause abnormal bleeding or blood clotting. These can include: ? Liver disease. ? Systemic infection (sepsis). ? Inherited (genetic) bleeding disorders.  You are taking a medicine to prevent excessive blood clotting (anticoagulant), such as warfarin. ? If you are taking warfarin, you will likely be asked to have this test done at regular intervals. The results of this test will help your health care provider determine what dose of warfarin you need based on how quickly or slowly your blood clots. It is very important to have this test done as often as your health care provider recommends. What is being tested? A prothrombin time (pro-time, PT) test measures how many seconds it takes your blood to clot. The international normalized ratio (INR) is a calculation of blood clotting time based on your PT result. Most labs report both PT and INR values when reporting blood clotting times. What kind of sample is taken?  A blood sample is required for this test. It is usually collected by inserting a needle into a blood vessel. Tell a health care provider about:  Any blood disorders you have.  All medicines you are taking, including vitamins, herbs, eye drops, creams, and over-the-counter medicines. Do not stop, add, or change any medicines without letting your health care provider know.  The foods you regularly eat, especially foods that contain moderate or high amounts of vitamin K. It is important to eat a consistent amount of foods rich in vitamin K. Let your health care provider know if you have recently changed your diet.  If you drink alcohol. This can affect your lab results. How are the results reported? Your test results will be reported as values. Your health care provider will compare your results to normal  ranges that were established after testing a large group of people (reference ranges). Reference ranges may vary among different labs and hospitals. For this test, common reference ranges are:  Without anticoagulant treatment (control value): 11.0-12.5 seconds; 85-100%.  INR: 0.8-1.1. If you are taking warfarin, talk with your health care provider about what your INR result should be. Generally, an INR of 2.0-3.0 is desired for blood clot prevention. This depends on your medical conditions. What do the results mean?  A higher than normal PT or INR means that your blood takes longer to form a clot. This can result from: ? Certain medicines. ? Liver disease. ? Lack of certain proteins that form clots (coagulation factors). ? Lack of some vitamins.  A lower than normal PT or INR means that your blood can form a clot easily. This can result from: ? Supplements that contain Vitamin K. ? Medicines that contain estrogen, such as birth control pills or hormone replacement. ? Cancer. ? Some blood disorders (disseminated intravascular coagulation). Talk with your health care provider about what your test results mean. If you are taking warfarin or another anticoagulant, your result ranges may be different. Talk to your health care provider about what your results should be. Questions to ask your health care provider Ask your health care provider or the department that is doing the test:  When will my results be ready?  How will I get my results?  What are my treatment options?  What other tests do I need?  What are my next steps? Summary  A prothrombin time (pro-time, PT) test measures how  many seconds it takes your blood to clot.  You may have this test if you have a medical condition that causes abnormal bleeding or blood clotting, or if you are taking a medicine to prevent abnormal blood clotting.  A test result that is higher than normal indicates that your blood is taking too long  to form a clot. This result may occur because you lack some vitamins, take certain medicines, or have certain medical conditions.  Talk with your health care provider about what your results mean. This information is not intended to replace advice given to you by your health care provider. Make sure you discuss any questions you have with your health care provider. Document Revised: 12/10/2017 Document Reviewed: 12/10/2017 Elsevier Patient Education  [place]. Atrial Fibrillation  Atrial fibrillation is a type of heartbeat that is irregular or fast. If you have this condition, your heart beats without any order. This makes it hard for your heart to pump blood in a normal way. Atrial fibrillation may come and go, or it may become a long-lasting problem. If this condition is not treated, it can put you at higher risk for stroke, heart failure, and other heart problems. What are the causes? This condition may be caused by diseases that damage the heart. They include:  High blood pressure.  Heart failure.  Heart valve disease.  Heart surgery. Other causes include:  Diabetes.  Thyroid disease.  Being overweight.  Kidney disease. Sometimes the cause is not known. What increases the risk? You are more likely to develop this condition if:  You are older.  You smoke.  You exercise often and very hard.  You have a family history of this condition.  You are a man.  You use drugs.  You drink a lot of alcohol.  You have lung conditions, such as emphysema, pneumonia, or COPD.  You have sleep apnea. What are the signs or symptoms? Common symptoms of this condition include:  A feeling that your heart is beating very fast.  Chest pain or discomfort.  Feeling short of breath.  Suddenly feeling light-headed or weak.  Getting tired easily during activity.  Fainting.  Sweating. In some cases, there are no symptoms. How is this treated? Treatment for this  condition depends on underlying conditions and how you feel when you have atrial fibrillation. They include:  Medicines to: ? Prevent blood clots. ? Treat heart rate or heart rhythm problems.  Using devices, such as a pacemaker, to correct heart rhythm problems.  Doing surgery to remove the part of the heart that sends bad signals.  Closing an area where clots can form in the heart (left atrial appendage). In some cases, your doctor will treat other underlying conditions. Follow these instructions at home: Medicines  Take over-the-counter and prescription medicines only as told by your doctor.  Do not take any new medicines without first talking to your doctor.  If you are taking blood thinners: ? Talk with your doctor before you take any medicines that have aspirin or NSAIDs, such as ibuprofen, in them. ? Take your medicine exactly as told by your doctor. Take it at the same time each day. ? Avoid activities that could hurt or bruise you. Follow instructions about how to prevent falls. ? Wear a bracelet that says you are taking blood thinners. Or, carry a card that lists what medicines you take. Lifestyle      Do not use any products that have nicotine or tobacco in them.  These include cigarettes, e-cigarettes, and chewing tobacco. If you need help quitting, ask your doctor.  Eat heart-healthy foods. Talk with your doctor about the right eating plan for you.  Exercise regularly as told by your doctor.  Do not drink alcohol.  Lose weight if you are overweight.  Do not use drugs, including cannabis. General instructions  If you have a condition that causes breathing to stop for a short period of time (apnea), treat it as told by your doctor.  Keep a healthy weight. Do not use diet pills unless your doctor says they are safe for you. Diet pills may make heart problems worse.  Keep all follow-up visits as told by your doctor. This is important. Contact a doctor if:  You  notice a change in the speed, rhythm, or strength of your heartbeat.  You are taking a blood-thinning medicine and you get more bruising.  You get tired more easily when you move or exercise.  You have a sudden change in weight. Get help right away if:   You have pain in your chest or your belly (abdomen).  You have trouble breathing.  You have side effects of blood thinners, such as blood in your vomit, poop (stool), or pee (urine), or bleeding that cannot stop.  You have any signs of a stroke. "BE FAST" is an easy way to remember the main warning signs: ? B - Balance. Signs are dizziness, sudden trouble walking, or loss of balance. ? E - Eyes. Signs are trouble seeing or a change in how you see. ? F - Face. Signs are sudden weakness or loss of feeling in the face, or the face or eyelid drooping on one side. ? A - Arms. Signs are weakness or loss of feeling in an arm. This happens suddenly and usually on one side of the body. ? S - Speech. Signs are sudden trouble speaking, slurred speech, or trouble understanding what people say. ? T - Time. Time to call emergency services. Write down what time symptoms started.  You have other signs of a stroke, such as: ? A sudden, very bad headache with no known cause. ? Feeling like you may vomit (nausea). ? Vomiting. ? A seizure. These symptoms may be an emergency. Do not wait to see if the symptoms will go away. Get medical help right away. Call your local emergency services (911 in the [place] Do not drive yourself to the hospital. Summary  Atrial fibrillation is a type of heartbeat that is irregular or fast.  You are at higher risk of this condition if you smoke, are older, have diabetes, or are overweight.  Follow your doctor's instructions about medicines, diet, exercise, and follow-up visits.  Get help right away if you have signs or symptoms of a stroke.  Get help right away if you cannot catch your breath, or you have chest pain  or discomfort. This information is not intended to replace advice given to you by your health care provider. Make sure you discuss any questions you have with your health care provider. Document Revised: 04/18/2019 Document Reviewed: 04/18/2019 Elsevier Patient Education  [place].

## 2020-09-16 NOTE — Assessment & Plan Note (Signed)
Labs completed results pending.  

## 2020-09-16 NOTE — Assessment & Plan Note (Signed)
Completed INR.  Increase warfarin dose today, take 3 tablets today and, resume regular dose 3 mg next 6 days follow-up in 1 week.  Provided education with printed handouts given to patient.

## 2020-09-18 DIAGNOSIS — M4186 Other forms of scoliosis, lumbar region: Secondary | ICD-10-CM | POA: Diagnosis not present

## 2020-09-18 DIAGNOSIS — I2510 Atherosclerotic heart disease of native coronary artery without angina pectoris: Secondary | ICD-10-CM | POA: Diagnosis not present

## 2020-09-18 DIAGNOSIS — K810 Acute cholecystitis: Secondary | ICD-10-CM | POA: Diagnosis not present

## 2020-09-18 DIAGNOSIS — I5033 Acute on chronic diastolic (congestive) heart failure: Secondary | ICD-10-CM | POA: Diagnosis not present

## 2020-09-18 DIAGNOSIS — I0981 Rheumatic heart failure: Secondary | ICD-10-CM | POA: Diagnosis not present

## 2020-09-18 DIAGNOSIS — M8588 Other specified disorders of bone density and structure, other site: Secondary | ICD-10-CM | POA: Diagnosis not present

## 2020-09-18 DIAGNOSIS — I509 Heart failure, unspecified: Secondary | ICD-10-CM | POA: Diagnosis not present

## 2020-09-18 DIAGNOSIS — I083 Combined rheumatic disorders of mitral, aortic and tricuspid valves: Secondary | ICD-10-CM | POA: Diagnosis not present

## 2020-09-18 DIAGNOSIS — I110 Hypertensive heart disease with heart failure: Secondary | ICD-10-CM | POA: Diagnosis not present

## 2020-09-18 DIAGNOSIS — M47816 Spondylosis without myelopathy or radiculopathy, lumbar region: Secondary | ICD-10-CM | POA: Diagnosis not present

## 2020-09-19 ENCOUNTER — Ambulatory Visit: Payer: Medicare HMO | Admitting: *Deleted

## 2020-09-19 DIAGNOSIS — I4891 Unspecified atrial fibrillation: Secondary | ICD-10-CM

## 2020-09-19 DIAGNOSIS — K819 Cholecystitis, unspecified: Secondary | ICD-10-CM

## 2020-09-19 NOTE — Chronic Care Management (AMB) (Signed)
   Care Management   Note  09/19/2020 Name: Norma Barajas MRN: [id number] DOB: 1941-03-24  Patient was discharged on 09/12/20 from [facility]. She was hospitalized for acute cholecystitis and Afib. She had a TOC visit with PCP on 09/16/20 and PHQ screening was negative at that time. Automated EMMI call on 09/17/20 indicated that she has "lost interest in things". She is not enrolled in the CCM program. She has been contacted regarding CCM services in the past and refused enrollment at that time.   Multiple attempts to reach patient by home and mobile telephone today were unsuccessful.   Follow up plan: Follow-up appt with GI, Dr Laural Golden on 09/23/20 Follow-up appt with cardio, Dr Percival Spanish on 09/24/20 Follow-up appt with PCP on 09/25/20 RN Care Manager will reach out regarding Red Emmi flag again over the next 7 days  Chong Sicilian, BSN, RN-BC [facility] Direct Dial: [phone]

## 2020-09-22 ENCOUNTER — Ambulatory Visit: Payer: Medicare HMO | Admitting: *Deleted

## 2020-09-22 DIAGNOSIS — R1011 Right upper quadrant pain: Secondary | ICD-10-CM

## 2020-09-22 DIAGNOSIS — K819 Cholecystitis, unspecified: Secondary | ICD-10-CM

## 2020-09-22 NOTE — Chronic Care Management (AMB) (Signed)
   Care Management   Note  09/22/2020 Name: Norma Barajas MRN: [id number] DOB: 1941-09-28   Patient was discharged on 09/12/20 from [facility]. She was hospitalized for acute cholecystitis and Afib. She had a TOC visit with PCP on 09/16/20 and PHQ screening was negative at that time. Automated EMMI call on 09/17/20 indicated that she has "lost interest in things". She is not enrolled in the CCM program. She has been contacted regarding CCM services in the past and refused enrollment at that time.   I spoke with Norma Barajas by telephone today regarding red EMMI flag. She reports that she hasn't necessarily lost interest in doing things, but she can't physically do the things that she wants to do due to back pain. The pain is worsened with activty and so she has to rest often. She is taking Tylenol which helps some. Per patient, providers haven't been able to tell her the exact cause of the back pain but she is aware of her upcoming appointments with GI, Cardio, and PCP to follow-up on assessment and treatment. Reviewed upcoming appointments and advised to reach out to PCP or other care provider with any new or worsening symptoms and to seek emergency medical attention if needed.    Follow up plan: Follow-up appt with GI, Dr Laural Golden on 09/23/20 Follow-up appt with cardio, Dr Percival Spanish on 09/24/20 Follow-up appt with PCP on 09/25/20  Chong Sicilian, BSN, RN-BC [place] [facility] [place] [facility] Direct Dial: [phone]

## 2020-09-23 ENCOUNTER — Ambulatory Visit (INDEPENDENT_AMBULATORY_CARE_PROVIDER_SITE_OTHER): Payer: Medicare HMO | Admitting: Internal Medicine

## 2020-09-23 ENCOUNTER — Other Ambulatory Visit: Payer: Self-pay

## 2020-09-23 ENCOUNTER — Encounter (INDEPENDENT_AMBULATORY_CARE_PROVIDER_SITE_OTHER): Payer: Self-pay | Admitting: Internal Medicine

## 2020-09-23 ENCOUNTER — Other Ambulatory Visit (INDEPENDENT_AMBULATORY_CARE_PROVIDER_SITE_OTHER): Payer: Self-pay | Admitting: Internal Medicine

## 2020-09-23 VITALS — BP 141/105 | HR 144 | Temp 97.5°F | Ht 61.0 in | Wt 112.8 lb

## 2020-09-23 DIAGNOSIS — R1011 Right upper quadrant pain: Secondary | ICD-10-CM | POA: Diagnosis not present

## 2020-09-23 DIAGNOSIS — N3000 Acute cystitis without hematuria: Secondary | ICD-10-CM

## 2020-09-23 DIAGNOSIS — N390 Urinary tract infection, site not specified: Secondary | ICD-10-CM | POA: Insufficient documentation

## 2020-09-23 MED ORDER — CIPROFLOXACIN HCL 250 MG PO TABS: 250.0000 mg | ORAL_TABLET | Freq: Two times a day (BID) | ORAL | 0 refills | Status: DC

## 2020-09-23 NOTE — Progress Notes (Signed)
Cardiology Office Note   Date:  09/24/2020   ID:  Norma Barajas, Norma Barajas 12/03/1940, MRN [id number]  PCP:  Loman Brooklyn, FNP  Cardiologist:   Minus Breeding, MD   Chief Complaint  Patient presents with  . Abdominal Pain       History of Present Illness: Norma Barajas is a 79 y.o. female who presents for follow up of atrial fibrillation.  She has been seen by Dr. [name] and Dr. Curt Bears.  She did have an essentially normal echo in 2017.  She was treated with Tikosyn for a while but could not afford this.  She has been treated with Sotalol.   She has had recurrent fibrillation and was followed in the Atrial Fib clinic.  She has been treated with amiodarone and DCCV and she had recurrent fib.  She was to have another DCCV but she declined and wanted to pursue rate control with amiodarone.    I saw her recently for hypertension.  Since I saw her she was in the hospital.  She had some right upper quadrant and back pain.  She had extensive GI evaluation and in fact has another study in a.m. plan to schedule.  She was noted to be in atrial fibrillation rapid rate.  He was thought to have some volume overload.  She was treated with IV Lasix.  Other meds were essentially continued.  She was sent home on torsemide.  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  There was some ventricular septal flattening.  There was moderate RV dysfunction.  She had severe atrial enlargement.  Pulmonary systolic pressure was measured at 35.  She just does not feel well.  She still having back pain and right upper quadrant pain.  She has insomnia decreased appetite.  She has fatigue.  She is lost about 8 pounds over the past month.  She is not having any new shortness of breath, PND or orthopnea.  She does not notice that she is in fibrillation.  She does not have any edema.  She is not describing substernal chest pressure, neck or arm discomfort.  I did review the hospital records for this visit.   Past  Medical History:  Diagnosis Date  . 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 Anxiety   . Arthritis    "right leg" (04/11/2015)  . BPPV (benign paroxysmal positional vertigo) 03/23/2016  . Cataract   . Coronary atherosclerosis of native coronary artery    a. Nonobstructive minimal CAD 10/2005.  Marland Kitchen Depression   . Diastolic dysfunction    Grade 1. Ejection fraction 60-65%.  Marland Kitchen Dysrhythmia    a fib  . Erosive esophagitis   . Essential hypertension   . GERD (gastroesophageal reflux disease)   . Grade III hemorrhoids   . History of hiatal hernia   . Hypercholesterolemia   . Internal hemorrhoids with complication 8/78/6767   OCT 2015 FLEX SIG/IH BANDING    . Migraine    "used to have them right bad; I don't now" (04/11/2015)  . MITRAL REGURGITATION 04/27/2010   Qualifier: Diagnosis of  By: [name], MD, [name]   . Osteoporosis   . Paroxysmal atrial fibrillation (HCC)   . PONV (postoperative nausea and vomiting)   . Vertigo     Past Surgical History:  Procedure Laterality Date  . BRAVO [place] STUDY  11/15/2012   Procedure: BRAVO [place];  Surgeon: [name], MD;  Location: [facility]  ENDO SUITE;  Service: Endoscopy;;  . CARDIOVERSION N/A 05/16/2020   Procedure: CARDIOVERSION;  Surgeon: Donato Heinz, MD;  Location: [facility] ENDOSCOPY;  Service: Cardiovascular;  Laterality: N/A;  . CATARACT EXTRACTION W/ INTRAOCULAR LENS  IMPLANT, BILATERAL Bilateral   . COLONOSCOPY  2008   Dr. [name]: internal hemorrhoids   . DILATION AND CURETTAGE OF UTERUS    . ESOPHAGOGASTRODUODENOSCOPY (EGD) WITH ESOPHAGEAL DILATION  2001   Dr. [name]: erosive esophagitis, esophageal stricture, duodenitis, s/p Savary dilation  . ESOPHAGOGASTRODUODENOSCOPY (EGD) WITH ESOPHAGEAL DILATION  11/15/2012   ONG:EXBMWUXLKG web was found & MOST LIKELY CAUSE FOR DYAPHAGIA/Polyp was found in the gastric body and gastric fundus/ gastritis on bx  . EYE SURGERY Bilateral    "laser OR after cataract OR; cause I  couldn't see"  . FLEXIBLE SIGMOIDOSCOPY N/A 08/22/2014   mild diverticulosis in sigmoid, moderate sized Grade 3 hemorrhoids s/p banding X 3.   Marland Kitchen FRACTURE SURGERY Right    below the knee - 2 bones broke has plates  . HEMORRHOID BANDING N/A 08/22/2014   Procedure: HEMORRHOID BANDING;  Surgeon: [name], MD;  Location: [facility] ENDO SUITE;  Service: Endoscopy;  Laterality: N/A;  . HEMORRHOID SURGERY N/A 03/30/2018   Procedure: EXTENSIVE HEMORRHOIDECTOMY;  Surgeon: [name], MD;  Location: [facility] ORS;  Service: General;  Laterality: N/A;  . OPEN REDUCTION INTERNAL FIXATION (ORIF) TIBIA/FIBULA FRACTURE Right 2013   broke tibia and fibula after falling down stairs  . PROLAPSED UTERINE FIBROID LIGATION  2015  . TOTAL ABDOMINAL HYSTERECTOMY       Current Outpatient Medications  Medication Sig Dispense Refill  . acetaminophen (TYLENOL) 500 MG tablet Take 1,000 mg by mouth 2 (two) times daily.     Marland Kitchen amiodarone (PACERONE) 200 MG tablet Take 1 tablet (200 mg total) by mouth daily. 90 tablet 1  . amLODipine (NORVASC) 10 MG tablet TAKE 1 TABLET EVERY DAY (Patient taking differently: Take 10 mg by mouth daily. ) 90 tablet 0  . cholecalciferol (VITAMIN D3) 25 MCG (1000 UT) tablet Take 1,000 Units by mouth daily.    . ciprofloxacin (CIPRO) 250 MG tablet Take 1 tablet (250 mg total) by mouth 2 (two) times daily. 10 tablet 0  . diclofenac Sodium (VOLTAREN) 1 % GEL Apply 2 g topically 4 (four) times daily. 50 g 2  . loratadine (CLARITIN) 10 MG tablet Take 10 mg by mouth daily as needed for allergies.    . metoprolol tartrate (LOPRESSOR) 100 MG tablet Take 1 tablet (100 mg total) by mouth 2 (two) times daily. 60 tablet 1  . Multiple Vitamin (MULTIVITAMIN) capsule Take 1 capsule by mouth every evening.    . Omega-3 Fatty Acids (FISH OIL) 1000 MG CAPS Take 1,000 mg by mouth 2 (two) times daily.     Marland Kitchen omeprazole (PRILOSEC) 20 MG capsule Take 1 capsule (20 mg total) by mouth daily. 90 capsule 3  . potassium  chloride (KLOR-CON) 10 MEQ tablet Take 3 tablets (30 mEq total) by mouth daily with breakfast. 60 tablet 3  . simvastatin (ZOCOR) 20 MG tablet Take 1 tablet (20 mg total) by mouth daily. 90 tablet 1  . torsemide (DEMADEX) 20 MG tablet Take 1 tablet (20 mg total) by mouth daily. 30 tablet 1  . warfarin (COUMADIN) 3 MG tablet Take 1/2 to 1 tablet every day as directed 30 tablet 3   No current facility-administered medications for this visit.    Allergies:   Codeine, Doxycycline, Triamterene-hctz, Atorvastatin, Crestor [rosuvastatin calcium], Morphine,  and Risedronate sodium    ROS:  Please see the history of present illness.   Otherwise, review of systems are positive for none.   All other systems are reviewed and negative.    PHYSICAL EXAM: VS:  BP 110/82   Pulse 96   Ht 5\' 1"  (1.549 m)   Wt 112 lb (50.8 kg)   BMI 21.16 kg/m  , BMI Body mass index is 21.16 kg/m. GENERAL: Slightly frail appearing NECK:  No jugular venous distention, waveform within normal limits, carotid upstroke brisk and symmetric, no bruits, no thyromegaly LUNGS:  Clear to auscultation bilaterally CHEST:  Unremarkable HEART:  PMI not displaced or sustained,S1 and S2 within normal limits, no S3,  no clicks, no rubs, no murmurs, irregular ABD:  Flat, positive bowel sounds normal in frequency in pitch, no bruits, no rebound, no guarding, no midline pulsatile mass, no hepatomegaly, no splenomegaly EXT:  2 plus pulses throughout, no edema, no cyanosis no clubbing   EKG:  EKG is not ordered today.   Recent Labs: 05/20/2020: TSH 0.805 08/18/2020: NT-Pro BNP CANCELED 09/10/2020: B Natriuretic Peptide 546.0 09/11/2020: ALT 20 09/12/2020: Magnesium 1.7 09/16/2020: BUN 19; Creatinine, Ser 1.36; Hemoglobin 15.8; Platelets 161; Potassium 4.3; Sodium 141    Lipid Panel    Component Value Date/Time   CHOL 132 08/05/2020 1523   CHOL 192 05/15/2013 0829   TRIG 128 08/05/2020 1523   TRIG 258 (H) 03/19/2014 0812   TRIG 179  (H) 05/15/2013 0829   HDL 39 (L) 08/05/2020 1523   HDL 45 03/19/2014 0812   HDL 44 05/15/2013 0829   CHOLHDL 3.4 08/05/2020 1523   CHOLHDL 3.9 02/10/2016 0208   VLDL 33 02/10/2016 0208   LDLCALC 70 08/05/2020 1523   LDLCALC 101 (H) 03/19/2014 0812   LDLCALC 112 (H) 05/15/2013 0829      Wt Readings from Last 3 Encounters:  09/24/20 112 lb (50.8 kg)  09/23/20 112 lb 12.8 oz (51.2 kg)  09/12/20 114 lb 3.2 oz (51.8 kg)      Other studies Reviewed: Additional studies/ records that were reviewed today include:  Hospital records.  Review of the above records demonstrates:  See elsewhere   ASSESSMENT AND PLAN:  ATRIAL FIBRILLATION:      She has not wanted another attempt at cardioversion.  She tolerates anticoagulation.  She will continue on the meds as listed.   ACUTE ON CHRONIC DIASTOLIC HF: She is on torsemide and does not seem to be particularly volume overloaded at this point.  I will continue with that.  She did have follow-up labs and her creatinine was mildly elevated.  I will put her down to get another basic metabolic profile at her upcoming blood work.  RV DYSFUNCTION: This is new.  The etiology of elevated pulmonary pressure is not entirely clear.  It is possible that some of her right upper quadrant dysfunction was related to passive hepatic congestion.  However, her liver enzymes were okay.  I am going to continue with the diuretic and likely follow-up with another echo in the future.  I would like to see if there is another etiology for her right upper quadrant pain and some I will await the results of the GI evaluation.  If she continues to have symptoms and evidence of RV dysfunction on a follow-up echo I would consider right heart cath to further evaluate.  HTN:   At target.  No change in therapy.  COVID EDUCATION:   She has received both of  her vaccines.   Current medicines are reviewed at length with the patient today.  The patient does not have concerns regarding  medicines.  The following changes have been made: None  Labs/ tests ordered today include: As above  Orders Placed This Encounter  Procedures  . Basic metabolic panel     Disposition:   FU with me in 2 months     Signed, Minus Breeding, MD  09/24/2020 3:50 PM    [facility]

## 2020-09-23 NOTE — Progress Notes (Signed)
Presenting complaint;  Follow-up for right upper quadrant abdominal pain and dysphagia. Patient complains of lower abdominal pain and dysuria.  Database sent subjective:  Patient is 79 year old Caucasian female who is here for scheduled visit accompanied by her husband. Patient was admitted to [facility] for right-sided abdominal pain.  Abdominal pelvic CT revealed gallbladder wall thickening with surrounding edema suggestive of acute cholecystitis.  She also had small to moderate volume ascites and bilateral pleural effusions.  Ultrasound was negative for cholelithiasis and gallbladder wall thickness was felt to be at upper limit of normal with trace pericholecystic fluid enlarged IVC and hepatic veins suggesting right heart failure. Patient is also complaining of dysphagia primarily to pills. Barium study was obtained and it suggested an esophageal motility disorder but no evidence of esophageal stricture. I felt his right-sided pain may be due to hepatic congestion.  She was seen by Dr. Carlyle Dolly of cardiology and diuretic therapy was escalated with symptomatic improvement.  Echo revealed multiple valvular abnormalities.  Patient was discharged on 09/12/2020. Patient says she does not feel any better.  She has lost 6 pounds since her hospitalization which is felt to be due to diureses.  She remains with pain in the right upper quadrant virtually every day and it radiates posteriorly.  She does not have chronic low back pain.  She denies fever chills nausea or vomiting.  Feels pain may be worse with meals.  She has had poor appetite and has been forcing herself to eat.  She is experience dysphagia with pills but lately not with fluids.  She has not had an episode of food impaction.  She says her bowels move daily and she denies melena or rectal bleeding. For the last 3 days she has noted burning pain in lower abdomen which radiates superiorly into epigastric region.  She has dysuria but denies  hematuria. She also denies chest pain or exertional dyspnea.  Current Medications: Outpatient Encounter Medications as of 09/23/2020  Medication Sig  . acetaminophen (TYLENOL) 500 MG tablet Take 1,000 mg by mouth 2 (two) times daily.   Marland Kitchen amiodarone (PACERONE) 200 MG tablet Take 1 tablet (200 mg total) by mouth daily.  Marland Kitchen amLODipine (NORVASC) 10 MG tablet TAKE 1 TABLET EVERY DAY (Patient taking differently: Take 10 mg by mouth daily. )  . cholecalciferol (VITAMIN D3) 25 MCG (1000 UT) tablet Take 1,000 Units by mouth daily.  . diclofenac Sodium (VOLTAREN) 1 % GEL Apply 2 g topically 4 (four) times daily.  Marland Kitchen loratadine (CLARITIN) 10 MG tablet Take 10 mg by mouth daily as needed for allergies.  . metoprolol tartrate (LOPRESSOR) 100 MG tablet Take 1 tablet (100 mg total) by mouth 2 (two) times daily.  . Multiple Vitamin (MULTIVITAMIN) capsule Take 1 capsule by mouth every evening.  . Omega-3 Fatty Acids (FISH OIL) 1000 MG CAPS Take 1,000 mg by mouth 2 (two) times daily.   Marland Kitchen omeprazole (PRILOSEC) 20 MG capsule Take 1 capsule (20 mg total) by mouth daily.  . potassium chloride (KLOR-CON) 10 MEQ tablet Take 3 tablets (30 mEq total) by mouth daily with breakfast.  . simvastatin (ZOCOR) 20 MG tablet Take 1 tablet (20 mg total) by mouth daily.  Marland Kitchen torsemide (DEMADEX) 20 MG tablet Take 1 tablet (20 mg total) by mouth daily.  Marland Kitchen warfarin (COUMADIN) 3 MG tablet Take 1/2 to 1 tablet every day as directed   No facility-administered encounter medications on file as of 09/23/2020.     Objective: Blood pressure (!) 141/105, pulse Marland Kitchen)  104, temperature (!) 97.5 F (36.4 C), temperature source Oral, height 5' 1"  (1.549 m), weight 112 lb 12.8 oz (51.2 kg). Patient is alert and in no acute distress. Conjunctivae is pink. Sclera is nonicteric. Conjunctiva is pink. Sclera is nonicteric Oropharyngeal mucosa is normal. No neck masses or thyromegaly noted. JVD is not elevated.   Cardiac exam with irregular rhythm  normal S1 and S2.  Faint systolic murmur noted at left sternal border. Lungs are clear to auscultation. Abdomen is symmetrical.  Bowel sounds are normal.  She has mild tenderness in hypogastric region as well as below the costal margin.  Liver edge is easily palpable.  Liver is pulsatile. No LE edema or clubbing noted.  Labs/studies Results:  CBC Latest Ref Rng & Units 09/16/2020 09/12/2020 09/11/2020  WBC 3.4 - 10.8 x10E3/uL 6.7 5.0 5.9  Hemoglobin 11.1 - 15.9 g/dL 15.8 14.7 15.5(H)  Hematocrit 34.0 - 46.6 % 50.1(H) 46.3(H) 49.0(H)  Platelets 150 - 450 x10E3/uL 161 157 184    CMP Latest Ref Rng & Units 09/16/2020 09/12/2020 09/11/2020  Glucose 65 - 99 mg/dL 125(H) 94 107(H)  BUN 8 - 27 mg/dL 19 20 15   Creatinine 0.57 - 1.00 mg/dL 1.36(H) 1.04(H) 1.06(H)  Sodium 134 - 144 mmol/L 141 138 138  Potassium 3.5 - 5.2 mmol/L 4.3 3.2(L) 3.1(L)  Chloride 96 - 106 mmol/L 99 97(L) 95(L)  CO2 20 - 29 mmol/L 26 30 30   Calcium 8.7 - 10.3 mg/dL 9.2 8.5(L) 8.4(L)  Total Protein 6.5 - 8.1 g/dL - - 6.4(L)  Total Bilirubin 0.3 - 1.2 mg/dL - - 1.0  Alkaline Phos 38 - 126 U/L - - 63  AST 15 - 41 U/L - - 35  ALT 0 - 44 U/L - - 20    Hepatic Function Latest Ref Rng & Units 09/11/2020 09/09/2020 09/08/2020  Total Protein 6.5 - 8.1 g/dL 6.4(L) 6.2(L) 5.7(L)  Albumin 3.5 - 5.0 g/dL 3.5 3.4(L) 3.1(L)  AST 15 - 41 U/L 35 28 26  ALT 0 - 44 U/L 20 18 18   Alk Phosphatase 38 - 126 U/L 63 56 52  Total Bilirubin 0.3 - 1.2 mg/dL 1.0 1.3(H) 1.0  Bilirubin, Direct 0.1 - 0.5 mg/dL - - -     Assessment:  #1.  Right upper quadrant abdominal pain.  Apparently she has had this pain for several weeks.  Work-up during recent hospitalization revealed gallbladder wall thickening ascites but no evidence of cholelithiasis.  It was concluded that her pain could be due to congestive hepatopathy.  Her symptoms have not improved with diuretic therapy.  She certainly could have gallbladder disease.  Therefore further work-up would be  appropriate.  #2.  Esophageal dysphagia appears to be due to esophageal motility disorder based on recent barium study.  If dysphagia worsens would consider esophageal dilation.  #3.  Hypogastric pain and dysuria suggestive of urinary tract infection.   Plan:  LFTs.  She may want to wait until tomorrow in case her cardiologist Dr. Percival Spanish wants to do further is studies. Urinalysis with culture. He can start Cipro at a dose of 250 mg by mouth twice daily as soon as she is provided urine sample to the lab.  Prescription given for 10 doses. Warfarin dose would have to be adjusted while she is on Cipro and would get Dr. Rosezella [place] help. HIDA scan with [facility]. Further recommendations to follow.

## 2020-09-23 NOTE — Patient Instructions (Addendum)
Begin antibiotic as soon as you have provided urine sample to the lab. May want to wait until tomorrow before you have blood drawn as your cardiologist may want to add some more. Please check with Dr. Percival Spanish about adjusting warfarin dose while you are on an antibiotic. HIDA scans to be scheduled.  Physician will call with results of urinalysis culture blood work and HIDA when completed.

## 2020-09-24 ENCOUNTER — Ambulatory Visit: Payer: Medicare HMO | Admitting: Cardiology

## 2020-09-24 ENCOUNTER — Encounter: Payer: Self-pay | Admitting: Cardiology

## 2020-09-24 VITALS — BP 110/82 | HR 96 | Ht 61.0 in | Wt 112.0 lb

## 2020-09-24 DIAGNOSIS — M47816 Spondylosis without myelopathy or radiculopathy, lumbar region: Secondary | ICD-10-CM | POA: Diagnosis not present

## 2020-09-24 DIAGNOSIS — I0981 Rheumatic heart failure: Secondary | ICD-10-CM | POA: Diagnosis not present

## 2020-09-24 DIAGNOSIS — M4186 Other forms of scoliosis, lumbar region: Secondary | ICD-10-CM | POA: Diagnosis not present

## 2020-09-24 DIAGNOSIS — K810 Acute cholecystitis: Secondary | ICD-10-CM | POA: Diagnosis not present

## 2020-09-24 DIAGNOSIS — I4819 Other persistent atrial fibrillation: Secondary | ICD-10-CM | POA: Diagnosis not present

## 2020-09-24 DIAGNOSIS — I10 Essential (primary) hypertension: Secondary | ICD-10-CM | POA: Diagnosis not present

## 2020-09-24 DIAGNOSIS — I2510 Atherosclerotic heart disease of native coronary artery without angina pectoris: Secondary | ICD-10-CM | POA: Diagnosis not present

## 2020-09-24 DIAGNOSIS — I083 Combined rheumatic disorders of mitral, aortic and tricuspid valves: Secondary | ICD-10-CM | POA: Diagnosis not present

## 2020-09-24 DIAGNOSIS — M8588 Other specified disorders of bone density and structure, other site: Secondary | ICD-10-CM | POA: Diagnosis not present

## 2020-09-24 DIAGNOSIS — I5033 Acute on chronic diastolic (congestive) heart failure: Secondary | ICD-10-CM | POA: Diagnosis not present

## 2020-09-24 DIAGNOSIS — I110 Hypertensive heart disease with heart failure: Secondary | ICD-10-CM | POA: Diagnosis not present

## 2020-09-24 NOTE — Patient Instructions (Signed)
Medication Instructions:  The current medical regimen is effective;  continue present plan and medications.  *If you need a refill on your cardiac medications before your next appointment, please call your pharmacy*  Lab Work: Please have blood work at [facility])  If you have labs (blood work) drawn today and your tests are completely normal, you will receive your results only by: Marland Kitchen MyChart Message (if you have MyChart) OR . A paper copy in the mail If you have any lab test that is abnormal or we need to change your treatment, we will call you to review the results.  Follow-Up: At [facility], you and your health needs are our priority.  As part of our continuing mission to provide you with exceptional heart care, we have created designated Provider Care Teams.  These Care Teams include your primary Cardiologist (physician) and Advanced Practice Providers (APPs -  Physician Assistants and Nurse Practitioners) who all work together to provide you with the care you need, when you need it.  We recommend signing up for the patient portal called "MyChart".  Sign up information is provided on this After Visit Summary.  MyChart is used to connect with patients for Virtual Visits (Telemedicine).  Patients are able to view lab/test results, encounter notes, upcoming appointments, etc.  Non-urgent messages can be sent to your provider as well.   To learn more about what you can do with MyChart, go to [email or web address].    Your next appointment:   2 month(s)  The format for your next appointment:   In Person  Provider:   Minus Breeding, MD

## 2020-09-25 ENCOUNTER — Other Ambulatory Visit (INDEPENDENT_AMBULATORY_CARE_PROVIDER_SITE_OTHER): Payer: Self-pay | Admitting: Internal Medicine

## 2020-09-25 ENCOUNTER — Other Ambulatory Visit: Payer: Self-pay | Admitting: Family Medicine

## 2020-09-25 ENCOUNTER — Telehealth (INDEPENDENT_AMBULATORY_CARE_PROVIDER_SITE_OTHER): Payer: Self-pay | Admitting: Internal Medicine

## 2020-09-25 ENCOUNTER — Ambulatory Visit (INDEPENDENT_AMBULATORY_CARE_PROVIDER_SITE_OTHER): Payer: Medicare HMO | Admitting: Nurse Practitioner

## 2020-09-25 ENCOUNTER — Other Ambulatory Visit: Payer: Self-pay

## 2020-09-25 ENCOUNTER — Encounter: Payer: Self-pay | Admitting: Nurse Practitioner

## 2020-09-25 VITALS — BP 102/62 | HR 105 | Temp 97.2°F | Resp 20 | Ht 61.0 in | Wt 112.0 lb

## 2020-09-25 DIAGNOSIS — I4891 Unspecified atrial fibrillation: Secondary | ICD-10-CM

## 2020-09-25 DIAGNOSIS — K219 Gastro-esophageal reflux disease without esophagitis: Secondary | ICD-10-CM

## 2020-09-25 DIAGNOSIS — R1011 Right upper quadrant pain: Secondary | ICD-10-CM | POA: Diagnosis not present

## 2020-09-25 LAB — COAGUCHEK XS/INR WAIVED
INR: 2.6 — ABNORMAL HIGH (ref 0.9–1.1)
Prothrombin Time: 30.8 s

## 2020-09-25 NOTE — Assessment & Plan Note (Signed)
Completed PT/INR.  Patient is therapeutic.  No changes to current dose follow-up in 4 weeks. Education provided with printed handouts given.

## 2020-09-25 NOTE — Telephone Encounter (Signed)
Tesh from [facility] left message - would like patients labs to be changed from [place] to [place] -

## 2020-09-25 NOTE — Progress Notes (Signed)
Subjective:     Indication: atrial fibrillation Bleeding signs/symptoms: None Thromboembolic signs/symptoms: None  Missed Coumadin doses: None Medication changes: no Dietary changes: no Bacterial/viral infection: yes - recent antibiotic use Other concerns: No  The following portions of the patient's history were reviewed and updated as appropriate: allergies, current medications, past family history, past medical history, past social history, past surgical history and problem list.  Review of Systems Pertinent items are noted in HPI.   Objective:    INR Today: 2.6 Current dose: 3 mg   Assessment:    Therapeutic INR for goal of 2-3   Plan:    1. New dose: no change   2. Next INR: 1 month    Description   INR was 2.6 today (goal is 2.0 to 3.0) Continue take 1.5 mg (1/2 tablet) on Tuesdays , 3 mg (1 tablet) the rest of the week.   Follow up on

## 2020-09-26 ENCOUNTER — Other Ambulatory Visit (INDEPENDENT_AMBULATORY_CARE_PROVIDER_SITE_OTHER): Payer: Self-pay | Admitting: *Deleted

## 2020-09-26 ENCOUNTER — Ambulatory Visit (INDEPENDENT_AMBULATORY_CARE_PROVIDER_SITE_OTHER): Payer: Medicare HMO

## 2020-09-26 DIAGNOSIS — K810 Acute cholecystitis: Secondary | ICD-10-CM

## 2020-09-26 DIAGNOSIS — I700 Atherosclerosis of aorta: Secondary | ICD-10-CM

## 2020-09-26 DIAGNOSIS — E785 Hyperlipidemia, unspecified: Secondary | ICD-10-CM

## 2020-09-26 DIAGNOSIS — I0981 Rheumatic heart failure: Secondary | ICD-10-CM | POA: Diagnosis not present

## 2020-09-26 DIAGNOSIS — R1310 Dysphagia, unspecified: Secondary | ICD-10-CM

## 2020-09-26 DIAGNOSIS — M4186 Other forms of scoliosis, lumbar region: Secondary | ICD-10-CM

## 2020-09-26 DIAGNOSIS — M810 Age-related osteoporosis without current pathological fracture: Secondary | ICD-10-CM

## 2020-09-26 DIAGNOSIS — M47816 Spondylosis without myelopathy or radiculopathy, lumbar region: Secondary | ICD-10-CM

## 2020-09-26 DIAGNOSIS — I110 Hypertensive heart disease with heart failure: Secondary | ICD-10-CM

## 2020-09-26 DIAGNOSIS — R109 Unspecified abdominal pain: Secondary | ICD-10-CM

## 2020-09-26 DIAGNOSIS — K2100 Gastro-esophageal reflux disease with esophagitis, without bleeding: Secondary | ICD-10-CM

## 2020-09-26 DIAGNOSIS — K449 Diaphragmatic hernia without obstruction or gangrene: Secondary | ICD-10-CM

## 2020-09-26 DIAGNOSIS — I5033 Acute on chronic diastolic (congestive) heart failure: Secondary | ICD-10-CM

## 2020-09-26 DIAGNOSIS — Z9181 History of falling: Secondary | ICD-10-CM

## 2020-09-26 DIAGNOSIS — D3501 Benign neoplasm of right adrenal gland: Secondary | ICD-10-CM

## 2020-09-26 DIAGNOSIS — I083 Combined rheumatic disorders of mitral, aortic and tricuspid valves: Secondary | ICD-10-CM

## 2020-09-26 DIAGNOSIS — M8588 Other specified disorders of bone density and structure, other site: Secondary | ICD-10-CM | POA: Diagnosis not present

## 2020-09-26 DIAGNOSIS — R195 Other fecal abnormalities: Secondary | ICD-10-CM

## 2020-09-26 DIAGNOSIS — E7800 Pure hypercholesterolemia, unspecified: Secondary | ICD-10-CM

## 2020-09-26 DIAGNOSIS — E876 Hypokalemia: Secondary | ICD-10-CM

## 2020-09-26 DIAGNOSIS — I480 Paroxysmal atrial fibrillation: Secondary | ICD-10-CM

## 2020-09-26 DIAGNOSIS — K5730 Diverticulosis of large intestine without perforation or abscess without bleeding: Secondary | ICD-10-CM

## 2020-09-26 DIAGNOSIS — R1011 Right upper quadrant pain: Secondary | ICD-10-CM

## 2020-09-26 DIAGNOSIS — Z7901 Long term (current) use of anticoagulants: Secondary | ICD-10-CM

## 2020-09-26 DIAGNOSIS — I2510 Atherosclerotic heart disease of native coronary artery without angina pectoris: Secondary | ICD-10-CM

## 2020-09-26 DIAGNOSIS — F32A Depression, unspecified: Secondary | ICD-10-CM

## 2020-09-26 DIAGNOSIS — Q394 Esophageal web: Secondary | ICD-10-CM

## 2020-09-26 DIAGNOSIS — H8110 Benign paroxysmal vertigo, unspecified ear: Secondary | ICD-10-CM

## 2020-09-26 DIAGNOSIS — G43909 Migraine, unspecified, not intractable, without status migrainosus: Secondary | ICD-10-CM

## 2020-09-26 DIAGNOSIS — H269 Unspecified cataract: Secondary | ICD-10-CM

## 2020-09-26 DIAGNOSIS — F419 Anxiety disorder, unspecified: Secondary | ICD-10-CM

## 2020-09-26 DIAGNOSIS — J9811 Atelectasis: Secondary | ICD-10-CM

## 2020-09-26 LAB — URINALYSIS W MICROSCOPIC + REFLEX CULTURE
Bacteria, UA: NONE SEEN /HPF
Bilirubin Urine: NEGATIVE
Glucose, UA: NEGATIVE
Hgb urine dipstick: NEGATIVE
Hyaline Cast: NONE SEEN /LPF
Ketones, ur: NEGATIVE
Nitrites, Initial: NEGATIVE
Protein, ur: NEGATIVE
Specific Gravity, Urine: 1.006 (ref 1.001–1.03)
Squamous Epithelial / HPF: NONE SEEN /HPF (ref <=5)
pH: <=5 (ref 5.0–8.0)

## 2020-09-26 LAB — URINE CULTURE
MICRO NUMBER:: 11211420
SPECIMEN QUALITY:: ADEQUATE

## 2020-09-26 LAB — HEPATIC FUNCTION PANEL
ALT: 18 IU/L (ref 0–32)
AST: 22 IU/L (ref 0–40)
Albumin: 4.3 g/dL (ref 3.6–4.6)
Alkaline Phosphatase: 84 IU/L (ref 44–121)
Bilirubin Total: 0.6 mg/dL (ref 0.0–1.2)
Bilirubin, Direct: 0.24 mg/dL (ref 0.00–0.40)
Total Protein: 6.6 g/dL (ref 6.0–8.5)

## 2020-09-26 LAB — CULTURE INDICATED

## 2020-09-26 NOTE — Telephone Encounter (Signed)
Lab order faxed to [facility].

## 2020-09-30 DIAGNOSIS — I2510 Atherosclerotic heart disease of native coronary artery without angina pectoris: Secondary | ICD-10-CM | POA: Diagnosis not present

## 2020-09-30 DIAGNOSIS — M47816 Spondylosis without myelopathy or radiculopathy, lumbar region: Secondary | ICD-10-CM | POA: Diagnosis not present

## 2020-09-30 DIAGNOSIS — M4186 Other forms of scoliosis, lumbar region: Secondary | ICD-10-CM | POA: Diagnosis not present

## 2020-09-30 DIAGNOSIS — I5033 Acute on chronic diastolic (congestive) heart failure: Secondary | ICD-10-CM | POA: Diagnosis not present

## 2020-09-30 DIAGNOSIS — K810 Acute cholecystitis: Secondary | ICD-10-CM | POA: Diagnosis not present

## 2020-09-30 DIAGNOSIS — I083 Combined rheumatic disorders of mitral, aortic and tricuspid valves: Secondary | ICD-10-CM | POA: Diagnosis not present

## 2020-09-30 DIAGNOSIS — I0981 Rheumatic heart failure: Secondary | ICD-10-CM | POA: Diagnosis not present

## 2020-09-30 DIAGNOSIS — M8588 Other specified disorders of bone density and structure, other site: Secondary | ICD-10-CM | POA: Diagnosis not present

## 2020-09-30 DIAGNOSIS — I110 Hypertensive heart disease with heart failure: Secondary | ICD-10-CM | POA: Diagnosis not present

## 2020-10-01 ENCOUNTER — Encounter (HOSPITAL_COMMUNITY): Payer: Medicare HMO

## 2020-10-01 ENCOUNTER — Encounter: Payer: Self-pay | Admitting: Family Medicine

## 2020-10-01 ENCOUNTER — Ambulatory Visit (INDEPENDENT_AMBULATORY_CARE_PROVIDER_SITE_OTHER): Payer: Medicare HMO

## 2020-10-01 ENCOUNTER — Ambulatory Visit (INDEPENDENT_AMBULATORY_CARE_PROVIDER_SITE_OTHER): Payer: Medicare HMO | Admitting: Family Medicine

## 2020-10-01 ENCOUNTER — Telehealth: Payer: Self-pay

## 2020-10-01 ENCOUNTER — Other Ambulatory Visit: Payer: Self-pay

## 2020-10-01 VITALS — BP 113/74 | HR 91 | Temp 97.5°F | Ht 61.0 in | Wt 112.0 lb

## 2020-10-01 DIAGNOSIS — R0781 Pleurodynia: Secondary | ICD-10-CM | POA: Diagnosis not present

## 2020-10-01 DIAGNOSIS — S2232XA Fracture of one rib, left side, initial encounter for closed fracture: Secondary | ICD-10-CM | POA: Diagnosis not present

## 2020-10-01 DIAGNOSIS — W19XXXA Unspecified fall, initial encounter: Secondary | ICD-10-CM

## 2020-10-01 DIAGNOSIS — R0789 Other chest pain: Secondary | ICD-10-CM | POA: Diagnosis not present

## 2020-10-01 DIAGNOSIS — Z7901 Long term (current) use of anticoagulants: Secondary | ICD-10-CM

## 2020-10-01 NOTE — Progress Notes (Signed)
Subjective: CC: Fall PCP: Loman Brooklyn, FNP Norma Barajas is a 79 y.o. female presenting to clinic today for:  1.  Fall Patient experienced a fall yesterday evening onto her bed.  She hit the box spring and has had some left sided posterior rib pain since that time.  She reports pain is a little bit more prominent with a deep breath or certain movements but she has had no shortness of breath.  No hemoptysis.  She has been using Tylenol and heat with some relief.  She is chronically anticoagulated.  ROS: Per HPI  Allergies  Allergen Reactions  . Codeine Nausea And Vomiting  . Doxycycline Other (See Comments)    Chest congestion  . Triamterene-Hctz Other (See Comments)    weakness  . Atorvastatin Other (See Comments)    Myalgias   . Crestor [Rosuvastatin Calcium] Other (See Comments)    weakness  . Morphine Nausea Only  . Risedronate Sodium Other (See Comments)    ACTONEL - reflux   Past Medical History:  Diagnosis Date  . 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 Anxiety   . Arthritis    "right leg" (04/11/2015)  . BPPV (benign paroxysmal positional vertigo) 03/23/2016  . Cataract   . Coronary atherosclerosis of native coronary artery    a. Nonobstructive minimal CAD 10/2005.  Marland Kitchen Depression   . Diastolic dysfunction    Grade 1. Ejection fraction 60-65%.  Marland Kitchen Dysrhythmia    a fib  . Erosive esophagitis   . Essential hypertension   . GERD (gastroesophageal reflux disease)   . Grade III hemorrhoids   . History of hiatal hernia   . Hypercholesterolemia   . Internal hemorrhoids with complication 0/06/6760   OCT 2015 FLEX SIG/IH BANDING    . Migraine    "used to have them right bad; I don't now" (04/11/2015)  . MITRAL REGURGITATION 04/27/2010   Qualifier: Diagnosis of  By: [name], MD, [name]   . Osteoporosis   . Paroxysmal atrial fibrillation (HCC)   . PONV (postoperative nausea and vomiting)   . Vertigo     Current Outpatient Medications:  .   acetaminophen (TYLENOL) 500 MG tablet, Take 1,000 mg by mouth 2 (two) times daily. , Disp: , Rfl:  .  amiodarone (PACERONE) 200 MG tablet, Take 1 tablet (200 mg total) by mouth daily., Disp: 90 tablet, Rfl: 1 .  amLODipine (NORVASC) 10 MG tablet, TAKE 1 TABLET EVERY DAY (Patient taking differently: Take 10 mg by mouth daily. ), Disp: 90 tablet, Rfl: 0 .  cholecalciferol (VITAMIN D3) 25 MCG (1000 UT) tablet, Take 1,000 Units by mouth daily., Disp: , Rfl:  .  ciprofloxacin (CIPRO) 250 MG tablet, Take 1 tablet (250 mg total) by mouth 2 (two) times daily., Disp: 10 tablet, Rfl: 0 .  diclofenac Sodium (VOLTAREN) 1 % GEL, Apply 2 g topically 4 (four) times daily., Disp: 50 g, Rfl: 2 .  loratadine (CLARITIN) 10 MG tablet, Take 10 mg by mouth daily as needed for allergies., Disp: , Rfl:  .  metoprolol tartrate (LOPRESSOR) 100 MG tablet, Take 1 tablet (100 mg total) by mouth 2 (two) times daily., Disp: 60 tablet, Rfl: 1 .  Multiple Vitamin (MULTIVITAMIN) capsule, Take 1 capsule by mouth every evening., Disp: , Rfl:  .  Omega-3 Fatty Acids (FISH OIL) 1000 MG CAPS, Take 1,000 mg by mouth 2 (two) times daily. , Disp: , Rfl:  .  omeprazole (PRILOSEC) 20 MG  capsule, TAKE 1 CAPSULE EVERY DAY, Disp: 90 capsule, Rfl: 0 .  potassium chloride (KLOR-CON) 10 MEQ tablet, Take 3 tablets (30 mEq total) by mouth daily with breakfast., Disp: 60 tablet, Rfl: 3 .  simvastatin (ZOCOR) 20 MG tablet, Take 1 tablet (20 mg total) by mouth daily., Disp: 90 tablet, Rfl: 1 .  torsemide (DEMADEX) 20 MG tablet, Take 1 tablet (20 mg total) by mouth daily., Disp: 30 tablet, Rfl: 1 .  warfarin (COUMADIN) 3 MG tablet, Take 1/2 to 1 tablet every day as directed, Disp: 30 tablet, Rfl: 3 Social History   Socioeconomic History  . Marital status: Married    Spouse name: george   . Number of children: 1  . Years of education: Not on file  . Highest education level: Not on file  Occupational History  . Occupation: Retired    Comment:  Textile  Tobacco Use  . Smoking status: Never Smoker  . Smokeless tobacco: Never Used  Vaping Use  . Vaping Use: Never used  Substance and Sexual Activity  . Alcohol use: No    Alcohol/week: 0.0 standard drinks  . Drug use: No  . Sexual activity: Not Currently    Birth control/protection: Surgical, Post-menopausal    Comment: hyst  Other Topics Concern  . Not on file  Social History Narrative   Married   No regular exercise   Social Determinants of Health   Financial Resource Strain:   . Difficulty of Paying Living Expenses: Not on file  Food Insecurity:   . Worried About Charity fundraiser in the Last Year: Not on file  . Ran Out of Food in the Last Year: Not on file  Transportation Needs:   . Lack of Transportation (Medical): Not on file  . Lack of Transportation (Non-Medical): Not on file  Physical Activity:   . Days of Exercise per Week: Not on file  . Minutes of Exercise per Session: Not on file  Stress:   . Feeling of Stress : Not on file  Social Connections:   . Frequency of Communication with Friends and Family: Not on file  . Frequency of Social Gatherings with Friends and Family: Not on file  . Attends Religious Services: Not on file  . Active Member of Clubs or Organizations: Not on file  . Attends Archivist Meetings: Not on file  . Marital Status: Not on file  Intimate Partner Violence:   . Fear of Current or Ex-Partner: Not on file  . Emotionally Abused: Not on file  . Physically Abused: Not on file  . Sexually Abused: Not on file   Family History  Problem Relation Age of Onset  . Colon cancer Mother 47  . Heart disease Mother   . Osteoporosis Mother   . Hip fracture Mother   . Stroke Sister   . Diabetes Sister   . Osteoporosis Sister   . Arthritis Sister   . Uterine cancer Sister   . Stroke Sister   . Heart disease Father   . Hyperlipidemia Brother   . Hypertension Brother   . Heart disease Brother   . Stroke Brother   . Heart  disease Sister   . Dementia Sister   . Diabetes Son   . Stroke Son   . Heart attack Neg Hx     Objective: Office vital signs reviewed. BP 113/74   Pulse 91   Temp (!) 97.5 F (36.4 C)   Ht 5\' 1"  (1.549 m)  Wt 112 lb (50.8 kg)   SpO2 93%   BMI 21.16 kg/m   Physical Examination:  General: Awake, alert, elderly, frail-appearing female, No acute distress HEENT: Normal; sclera white Cardio: regular rate and rhythm, S1S2 heard, no murmurs appreciated Pulm: clear to auscultation bilaterally, no wheezes, rhonchi or rales; normal work of breathing on room air MSK: arrives in wheelchair, no palpable bony abnormalities along the ribcage Skin: Contusion noted along the left flank that is linear and approximately an inch and a half in size.  No skin breakdown  Assessment/ Plan: 79 y.o. female   Fracture of one rib, left side, initial encounter for closed fracture  Fall, initial encounter - Plan: DG Ribs Unilateral W/Chest Left  Rib pain on left side - Plan: DG Ribs Unilateral W/Chest Left  Chronic anticoagulation  Physical exam was fairly unremarkable except for mild contusion noted to the left posterior lateral aspect of the rib cage.  However, given patient's frailty x-ray was obtained, which upon personal review did demonstrate a minimally displaced rib fracture.  Awaiting formal review by radiology.  No appreciable pneumothorax.  Salonpas, ice, supportive care.  Would consider repeat x-ray in 2 to 4 weeks to evaluate for healing.  She understands red flag signs and symptoms warranting further evaluation emergency department.  No orders of the defined types were placed in this encounter.  No orders of the defined types were placed in this encounter.    Norma Norlander, DO [place] [phone]

## 2020-10-01 NOTE — Telephone Encounter (Signed)
Pt called and said that she fell yesterday and is now having a lot of back pain. Said she called this morning to get an appt but we didn't have any openings. Explained to pt that we still dont have any openings. Pt wants to know if anyone can work her in to be seen today.   Please advise and call patient.

## 2020-10-01 NOTE — Telephone Encounter (Signed)
Scheduled pt to see Dr Lajuana Ripple today at 3:15. Pt aware to be here by 3:00 for work in.

## 2020-10-01 NOTE — Patient Instructions (Signed)
Can use Salonpas patches on the painful area.  Ok to continue tylenol and heat/ice if needed for pain.  Contusion A contusion is a deep bruise. This is a result of an injury that causes bleeding under the skin. Symptoms of bruising include pain, swelling, and discolored skin. The skin may turn blue, purple, or yellow. Follow these instructions at home: Managing pain, stiffness, and swelling You may use RICE. This stands for:  Resting.  Icing.  Compression, or putting pressure.  Elevating, or raising the injured area. To follow this method, do these actions:  Rest the injured area.  If told, put ice on the injured area. ? Put ice in a plastic bag. ? Place a towel between your skin and the bag. ? Leave the ice on for 20 minutes, 2-3 times per day.  If told, put light pressure (compression) on the injured area using an elastic bandage. Make sure the bandage is not too tight. If the area tingles or becomes numb, remove it and put it back on as told by your doctor.  If possible, raise (elevate) the injured area above the level of your heart while you are sitting or lying down.  General instructions  Take over-the-counter and prescription medicines only as told by your doctor.  Keep all follow-up visits as told by your doctor. This is important. Contact a doctor if:  Your symptoms do not get better after several days of treatment.  Your symptoms get worse.  You have trouble moving the injured area. Get help right away if:  You have very bad pain.  You have a loss of feeling (numbness) in a hand or foot.  Your hand or foot turns pale or cold. Summary  A contusion is a deep bruise. This is a result of an injury that causes bleeding under the skin.  Symptoms of bruising include pain, swelling, and discolored skin. The skin may turn blue, purple, or yellow.  This condition is treated with rest, ice, compression, and elevation. This is also called RICE. You may be given  over-the-counter medicines for pain.  Contact a doctor if you do not feel better, or you feel worse. Get help right away if you have very bad pain, have lost feeling in a hand or foot, or the area turns pale or cold. This information is not intended to replace advice given to you by your health care provider. Make sure you discuss any questions you have with your health care provider. Document Revised: 06/16/2018 Document Reviewed: 06/16/2018 Elsevier Patient Education  [place].

## 2020-10-08 ENCOUNTER — Telehealth: Payer: Self-pay | Admitting: *Deleted

## 2020-10-08 NOTE — Telephone Encounter (Signed)
VM from [facility] w/ [facility] Pt had a fall yesterday, she refused PT today and is reluctant to do PT & any exercises

## 2020-10-11 DIAGNOSIS — S36892D Contusion of other intra-abdominal organs, subsequent encounter: Secondary | ICD-10-CM | POA: Diagnosis not present

## 2020-10-11 DIAGNOSIS — J189 Pneumonia, unspecified organism: Secondary | ICD-10-CM | POA: Diagnosis not present

## 2020-10-11 DIAGNOSIS — I959 Hypotension, unspecified: Secondary | ICD-10-CM | POA: Diagnosis not present

## 2020-10-11 DIAGNOSIS — S3210XA Unspecified fracture of sacrum, initial encounter for closed fracture: Secondary | ICD-10-CM | POA: Diagnosis not present

## 2020-10-11 DIAGNOSIS — J90 Pleural effusion, not elsewhere classified: Secondary | ICD-10-CM | POA: Diagnosis not present

## 2020-10-11 DIAGNOSIS — S32502A Unspecified fracture of left pubis, initial encounter for closed fracture: Secondary | ICD-10-CM | POA: Diagnosis not present

## 2020-10-11 DIAGNOSIS — S32009A Unspecified fracture of unspecified lumbar vertebra, initial encounter for closed fracture: Secondary | ICD-10-CM | POA: Diagnosis not present

## 2020-10-11 DIAGNOSIS — W1830XA Fall on same level, unspecified, initial encounter: Secondary | ICD-10-CM | POA: Diagnosis not present

## 2020-10-11 DIAGNOSIS — S32009D Unspecified fracture of unspecified lumbar vertebra, subsequent encounter for fracture with routine healing: Secondary | ICD-10-CM | POA: Diagnosis not present

## 2020-10-11 DIAGNOSIS — K661 Hemoperitoneum: Secondary | ICD-10-CM | POA: Diagnosis not present

## 2020-10-11 DIAGNOSIS — K5900 Constipation, unspecified: Secondary | ICD-10-CM | POA: Diagnosis not present

## 2020-10-11 DIAGNOSIS — J9811 Atelectasis: Secondary | ICD-10-CM | POA: Diagnosis not present

## 2020-10-11 DIAGNOSIS — G8911 Acute pain due to trauma: Secondary | ICD-10-CM | POA: Diagnosis not present

## 2020-10-11 DIAGNOSIS — M8088XA Other osteoporosis with current pathological fracture, vertebra(e), initial encounter for fracture: Secondary | ICD-10-CM | POA: Diagnosis not present

## 2020-10-11 DIAGNOSIS — I2510 Atherosclerotic heart disease of native coronary artery without angina pectoris: Secondary | ICD-10-CM | POA: Diagnosis not present

## 2020-10-11 DIAGNOSIS — I5082 Biventricular heart failure: Secondary | ICD-10-CM | POA: Diagnosis not present

## 2020-10-11 DIAGNOSIS — M5126 Other intervertebral disc displacement, lumbar region: Secondary | ICD-10-CM | POA: Diagnosis not present

## 2020-10-11 DIAGNOSIS — R2689 Other abnormalities of gait and mobility: Secondary | ICD-10-CM | POA: Diagnosis not present

## 2020-10-11 DIAGNOSIS — M6281 Muscle weakness (generalized): Secondary | ICD-10-CM | POA: Diagnosis not present

## 2020-10-11 DIAGNOSIS — Z7401 Bed confinement status: Secondary | ICD-10-CM | POA: Diagnosis not present

## 2020-10-11 DIAGNOSIS — I472 Ventricular tachycardia: Secondary | ICD-10-CM | POA: Diagnosis not present

## 2020-10-11 DIAGNOSIS — N390 Urinary tract infection, site not specified: Secondary | ICD-10-CM | POA: Diagnosis not present

## 2020-10-11 DIAGNOSIS — I259 Chronic ischemic heart disease, unspecified: Secondary | ICD-10-CM | POA: Diagnosis not present

## 2020-10-11 DIAGNOSIS — S32592A Other specified fracture of left pubis, initial encounter for closed fracture: Secondary | ICD-10-CM | POA: Diagnosis not present

## 2020-10-11 DIAGNOSIS — S3289XA Fracture of other parts of pelvis, initial encounter for closed fracture: Secondary | ICD-10-CM | POA: Diagnosis not present

## 2020-10-11 DIAGNOSIS — S37892A Contusion of other urinary and pelvic organ, initial encounter: Secondary | ICD-10-CM | POA: Diagnosis not present

## 2020-10-11 DIAGNOSIS — W101XXA Fall (on)(from) sidewalk curb, initial encounter: Secondary | ICD-10-CM | POA: Diagnosis not present

## 2020-10-11 DIAGNOSIS — S32511A Fracture of superior rim of right pubis, initial encounter for closed fracture: Secondary | ICD-10-CM | POA: Diagnosis not present

## 2020-10-11 DIAGNOSIS — R000 Tachycardia, unspecified: Secondary | ICD-10-CM | POA: Diagnosis not present

## 2020-10-11 DIAGNOSIS — R52 Pain, unspecified: Secondary | ICD-10-CM | POA: Diagnosis not present

## 2020-10-11 DIAGNOSIS — R41841 Cognitive communication deficit: Secondary | ICD-10-CM | POA: Diagnosis not present

## 2020-10-11 DIAGNOSIS — R0902 Hypoxemia: Secondary | ICD-10-CM | POA: Diagnosis not present

## 2020-10-11 DIAGNOSIS — T83511A Infection and inflammatory reaction due to indwelling urethral catheter, initial encounter: Secondary | ICD-10-CM | POA: Diagnosis not present

## 2020-10-11 DIAGNOSIS — Z20822 Contact with and (suspected) exposure to covid-19: Secondary | ICD-10-CM | POA: Diagnosis not present

## 2020-10-11 DIAGNOSIS — R109 Unspecified abdominal pain: Secondary | ICD-10-CM | POA: Diagnosis not present

## 2020-10-11 DIAGNOSIS — I4821 Permanent atrial fibrillation: Secondary | ICD-10-CM | POA: Diagnosis not present

## 2020-10-11 DIAGNOSIS — I50812 Chronic right heart failure: Secondary | ICD-10-CM | POA: Diagnosis not present

## 2020-10-11 DIAGNOSIS — M79604 Pain in right leg: Secondary | ICD-10-CM | POA: Diagnosis not present

## 2020-10-11 DIAGNOSIS — S32591A Other specified fracture of right pubis, initial encounter for closed fracture: Secondary | ICD-10-CM | POA: Diagnosis not present

## 2020-10-11 DIAGNOSIS — S32509D Unspecified fracture of unspecified pubis, subsequent encounter for fracture with routine healing: Secondary | ICD-10-CM | POA: Diagnosis not present

## 2020-10-11 DIAGNOSIS — S32039A Unspecified fracture of third lumbar vertebra, initial encounter for closed fracture: Secondary | ICD-10-CM | POA: Diagnosis not present

## 2020-10-11 DIAGNOSIS — W19XXXA Unspecified fall, initial encounter: Secondary | ICD-10-CM | POA: Diagnosis not present

## 2020-10-11 DIAGNOSIS — M4316 Spondylolisthesis, lumbar region: Secondary | ICD-10-CM | POA: Diagnosis not present

## 2020-10-11 DIAGNOSIS — S36892A Contusion of other intra-abdominal organs, initial encounter: Secondary | ICD-10-CM | POA: Diagnosis not present

## 2020-10-11 DIAGNOSIS — I4891 Unspecified atrial fibrillation: Secondary | ICD-10-CM | POA: Diagnosis not present

## 2020-10-11 DIAGNOSIS — R58 Hemorrhage, not elsewhere classified: Secondary | ICD-10-CM | POA: Diagnosis not present

## 2020-10-11 DIAGNOSIS — I482 Chronic atrial fibrillation, unspecified: Secondary | ICD-10-CM | POA: Diagnosis not present

## 2020-10-11 DIAGNOSIS — S3219XA Other fracture of sacrum, initial encounter for closed fracture: Secondary | ICD-10-CM | POA: Diagnosis not present

## 2020-10-11 DIAGNOSIS — D62 Acute posthemorrhagic anemia: Secondary | ICD-10-CM | POA: Diagnosis not present

## 2020-10-11 DIAGNOSIS — R279 Unspecified lack of coordination: Secondary | ICD-10-CM | POA: Diagnosis not present

## 2020-10-11 DIAGNOSIS — I110 Hypertensive heart disease with heart failure: Secondary | ICD-10-CM | POA: Diagnosis not present

## 2020-10-11 DIAGNOSIS — S3681XA Injury of peritoneum, initial encounter: Secondary | ICD-10-CM | POA: Diagnosis not present

## 2020-10-11 DIAGNOSIS — S32601A Unspecified fracture of right ischium, initial encounter for closed fracture: Secondary | ICD-10-CM | POA: Diagnosis not present

## 2020-10-11 DIAGNOSIS — S32512A Fracture of superior rim of left pubis, initial encounter for closed fracture: Secondary | ICD-10-CM | POA: Diagnosis not present

## 2020-10-11 DIAGNOSIS — M5127 Other intervertebral disc displacement, lumbosacral region: Secondary | ICD-10-CM | POA: Diagnosis not present

## 2020-10-11 DIAGNOSIS — S32501A Unspecified fracture of right pubis, initial encounter for closed fracture: Secondary | ICD-10-CM | POA: Diagnosis not present

## 2020-10-11 DIAGNOSIS — R42 Dizziness and giddiness: Secondary | ICD-10-CM | POA: Diagnosis not present

## 2020-10-11 DIAGNOSIS — I361 Nonrheumatic tricuspid (valve) insufficiency: Secondary | ICD-10-CM | POA: Diagnosis not present

## 2020-10-11 DIAGNOSIS — S3282XA Multiple fractures of pelvis without disruption of pelvic ring, initial encounter for closed fracture: Secondary | ICD-10-CM | POA: Diagnosis not present

## 2020-10-11 DIAGNOSIS — N179 Acute kidney failure, unspecified: Secondary | ICD-10-CM | POA: Diagnosis not present

## 2020-10-11 DIAGNOSIS — I5032 Chronic diastolic (congestive) heart failure: Secondary | ICD-10-CM | POA: Diagnosis not present

## 2020-10-11 DIAGNOSIS — S300XXA Contusion of lower back and pelvis, initial encounter: Secondary | ICD-10-CM | POA: Diagnosis not present

## 2020-10-11 DIAGNOSIS — I10 Essential (primary) hypertension: Secondary | ICD-10-CM | POA: Diagnosis not present

## 2020-10-11 DIAGNOSIS — M816 Localized osteoporosis [Lequesne]: Secondary | ICD-10-CM | POA: Diagnosis not present

## 2020-10-11 DIAGNOSIS — R296 Repeated falls: Secondary | ICD-10-CM | POA: Diagnosis not present

## 2020-10-12 DIAGNOSIS — I10 Essential (primary) hypertension: Secondary | ICD-10-CM | POA: Diagnosis not present

## 2020-10-12 DIAGNOSIS — S32009A Unspecified fracture of unspecified lumbar vertebra, initial encounter for closed fracture: Secondary | ICD-10-CM | POA: Diagnosis not present

## 2020-10-12 DIAGNOSIS — I4891 Unspecified atrial fibrillation: Secondary | ICD-10-CM | POA: Diagnosis not present

## 2020-10-12 DIAGNOSIS — S3289XA Fracture of other parts of pelvis, initial encounter for closed fracture: Secondary | ICD-10-CM | POA: Diagnosis not present

## 2020-10-12 DIAGNOSIS — M8088XA Other osteoporosis with current pathological fracture, vertebra(e), initial encounter for fracture: Secondary | ICD-10-CM | POA: Diagnosis not present

## 2020-10-12 DIAGNOSIS — W19XXXA Unspecified fall, initial encounter: Secondary | ICD-10-CM | POA: Diagnosis not present

## 2020-10-13 DIAGNOSIS — S32009A Unspecified fracture of unspecified lumbar vertebra, initial encounter for closed fracture: Secondary | ICD-10-CM | POA: Diagnosis not present

## 2020-10-13 DIAGNOSIS — I10 Essential (primary) hypertension: Secondary | ICD-10-CM | POA: Diagnosis not present

## 2020-10-13 DIAGNOSIS — I4891 Unspecified atrial fibrillation: Secondary | ICD-10-CM | POA: Diagnosis not present

## 2020-10-13 DIAGNOSIS — S3289XA Fracture of other parts of pelvis, initial encounter for closed fracture: Secondary | ICD-10-CM | POA: Diagnosis not present

## 2020-10-13 DIAGNOSIS — M8088XA Other osteoporosis with current pathological fracture, vertebra(e), initial encounter for fracture: Secondary | ICD-10-CM | POA: Diagnosis not present

## 2020-10-13 DIAGNOSIS — W19XXXA Unspecified fall, initial encounter: Secondary | ICD-10-CM | POA: Diagnosis not present

## 2020-10-14 DIAGNOSIS — S32592A Other specified fracture of left pubis, initial encounter for closed fracture: Secondary | ICD-10-CM | POA: Diagnosis not present

## 2020-10-14 DIAGNOSIS — S32009A Unspecified fracture of unspecified lumbar vertebra, initial encounter for closed fracture: Secondary | ICD-10-CM | POA: Diagnosis not present

## 2020-10-14 DIAGNOSIS — S32591A Other specified fracture of right pubis, initial encounter for closed fracture: Secondary | ICD-10-CM | POA: Diagnosis not present

## 2020-10-14 DIAGNOSIS — S32511A Fracture of superior rim of right pubis, initial encounter for closed fracture: Secondary | ICD-10-CM | POA: Diagnosis not present

## 2020-10-14 DIAGNOSIS — S32512A Fracture of superior rim of left pubis, initial encounter for closed fracture: Secondary | ICD-10-CM | POA: Diagnosis not present

## 2020-10-14 DIAGNOSIS — I2510 Atherosclerotic heart disease of native coronary artery without angina pectoris: Secondary | ICD-10-CM | POA: Diagnosis not present

## 2020-10-14 DIAGNOSIS — K661 Hemoperitoneum: Secondary | ICD-10-CM | POA: Diagnosis not present

## 2020-10-14 DIAGNOSIS — S37892A Contusion of other urinary and pelvic organ, initial encounter: Secondary | ICD-10-CM | POA: Diagnosis not present

## 2020-10-14 DIAGNOSIS — W101XXA Fall (on)(from) sidewalk curb, initial encounter: Secondary | ICD-10-CM | POA: Diagnosis not present

## 2020-10-14 DIAGNOSIS — S3282XA Multiple fractures of pelvis without disruption of pelvic ring, initial encounter for closed fracture: Secondary | ICD-10-CM | POA: Diagnosis not present

## 2020-10-15 DIAGNOSIS — W101XXA Fall (on)(from) sidewalk curb, initial encounter: Secondary | ICD-10-CM | POA: Diagnosis not present

## 2020-10-15 DIAGNOSIS — I2510 Atherosclerotic heart disease of native coronary artery without angina pectoris: Secondary | ICD-10-CM | POA: Diagnosis not present

## 2020-10-15 DIAGNOSIS — S32009A Unspecified fracture of unspecified lumbar vertebra, initial encounter for closed fracture: Secondary | ICD-10-CM | POA: Diagnosis not present

## 2020-10-15 DIAGNOSIS — N179 Acute kidney failure, unspecified: Secondary | ICD-10-CM | POA: Diagnosis not present

## 2020-10-15 DIAGNOSIS — K661 Hemoperitoneum: Secondary | ICD-10-CM | POA: Diagnosis not present

## 2020-10-16 DIAGNOSIS — I2510 Atherosclerotic heart disease of native coronary artery without angina pectoris: Secondary | ICD-10-CM | POA: Diagnosis not present

## 2020-10-16 DIAGNOSIS — S32009A Unspecified fracture of unspecified lumbar vertebra, initial encounter for closed fracture: Secondary | ICD-10-CM | POA: Diagnosis not present

## 2020-10-16 DIAGNOSIS — W101XXA Fall (on)(from) sidewalk curb, initial encounter: Secondary | ICD-10-CM | POA: Diagnosis not present

## 2020-10-16 DIAGNOSIS — K661 Hemoperitoneum: Secondary | ICD-10-CM | POA: Diagnosis not present

## 2020-10-16 DIAGNOSIS — N179 Acute kidney failure, unspecified: Secondary | ICD-10-CM | POA: Diagnosis not present

## 2020-10-17 DIAGNOSIS — W19XXXA Unspecified fall, initial encounter: Secondary | ICD-10-CM | POA: Diagnosis not present

## 2020-10-17 DIAGNOSIS — I2510 Atherosclerotic heart disease of native coronary artery without angina pectoris: Secondary | ICD-10-CM | POA: Diagnosis not present

## 2020-10-17 DIAGNOSIS — K661 Hemoperitoneum: Secondary | ICD-10-CM | POA: Diagnosis not present

## 2020-10-17 DIAGNOSIS — N179 Acute kidney failure, unspecified: Secondary | ICD-10-CM | POA: Diagnosis not present

## 2020-10-17 DIAGNOSIS — S300XXA Contusion of lower back and pelvis, initial encounter: Secondary | ICD-10-CM | POA: Diagnosis not present

## 2020-10-17 DIAGNOSIS — S3289XA Fracture of other parts of pelvis, initial encounter for closed fracture: Secondary | ICD-10-CM | POA: Diagnosis not present

## 2020-10-17 DIAGNOSIS — S32009A Unspecified fracture of unspecified lumbar vertebra, initial encounter for closed fracture: Secondary | ICD-10-CM | POA: Diagnosis not present

## 2020-10-17 DIAGNOSIS — W101XXA Fall (on)(from) sidewalk curb, initial encounter: Secondary | ICD-10-CM | POA: Diagnosis not present

## 2020-10-18 DIAGNOSIS — K5900 Constipation, unspecified: Secondary | ICD-10-CM | POA: Diagnosis not present

## 2020-10-18 DIAGNOSIS — S32009A Unspecified fracture of unspecified lumbar vertebra, initial encounter for closed fracture: Secondary | ICD-10-CM | POA: Diagnosis not present

## 2020-10-18 DIAGNOSIS — I2510 Atherosclerotic heart disease of native coronary artery without angina pectoris: Secondary | ICD-10-CM | POA: Diagnosis not present

## 2020-10-18 DIAGNOSIS — W101XXA Fall (on)(from) sidewalk curb, initial encounter: Secondary | ICD-10-CM | POA: Diagnosis not present

## 2020-10-18 DIAGNOSIS — I110 Hypertensive heart disease with heart failure: Secondary | ICD-10-CM | POA: Diagnosis not present

## 2020-10-18 DIAGNOSIS — K661 Hemoperitoneum: Secondary | ICD-10-CM | POA: Diagnosis not present

## 2020-10-19 DIAGNOSIS — S36892A Contusion of other intra-abdominal organs, initial encounter: Secondary | ICD-10-CM | POA: Diagnosis not present

## 2020-10-19 DIAGNOSIS — I4891 Unspecified atrial fibrillation: Secondary | ICD-10-CM | POA: Diagnosis not present

## 2020-10-19 DIAGNOSIS — R109 Unspecified abdominal pain: Secondary | ICD-10-CM | POA: Diagnosis not present

## 2020-10-19 DIAGNOSIS — S32009D Unspecified fracture of unspecified lumbar vertebra, subsequent encounter for fracture with routine healing: Secondary | ICD-10-CM | POA: Diagnosis not present

## 2020-10-19 DIAGNOSIS — I2510 Atherosclerotic heart disease of native coronary artery without angina pectoris: Secondary | ICD-10-CM | POA: Diagnosis not present

## 2020-10-19 DIAGNOSIS — K5900 Constipation, unspecified: Secondary | ICD-10-CM | POA: Diagnosis not present

## 2020-10-20 DIAGNOSIS — S32009D Unspecified fracture of unspecified lumbar vertebra, subsequent encounter for fracture with routine healing: Secondary | ICD-10-CM | POA: Diagnosis not present

## 2020-10-20 DIAGNOSIS — G8911 Acute pain due to trauma: Secondary | ICD-10-CM | POA: Diagnosis not present

## 2020-10-20 DIAGNOSIS — S36892A Contusion of other intra-abdominal organs, initial encounter: Secondary | ICD-10-CM | POA: Diagnosis not present

## 2020-10-20 DIAGNOSIS — I4891 Unspecified atrial fibrillation: Secondary | ICD-10-CM | POA: Diagnosis not present

## 2020-10-20 DIAGNOSIS — I2510 Atherosclerotic heart disease of native coronary artery without angina pectoris: Secondary | ICD-10-CM | POA: Diagnosis not present

## 2020-10-20 DIAGNOSIS — K5900 Constipation, unspecified: Secondary | ICD-10-CM | POA: Diagnosis not present

## 2020-10-20 DIAGNOSIS — I472 Ventricular tachycardia: Secondary | ICD-10-CM | POA: Diagnosis not present

## 2020-10-21 DIAGNOSIS — I472 Ventricular tachycardia: Secondary | ICD-10-CM | POA: Diagnosis not present

## 2020-10-21 DIAGNOSIS — S36892D Contusion of other intra-abdominal organs, subsequent encounter: Secondary | ICD-10-CM | POA: Diagnosis not present

## 2020-10-21 DIAGNOSIS — I2510 Atherosclerotic heart disease of native coronary artery without angina pectoris: Secondary | ICD-10-CM | POA: Diagnosis not present

## 2020-10-21 DIAGNOSIS — I4891 Unspecified atrial fibrillation: Secondary | ICD-10-CM | POA: Diagnosis not present

## 2020-10-21 DIAGNOSIS — S32009D Unspecified fracture of unspecified lumbar vertebra, subsequent encounter for fracture with routine healing: Secondary | ICD-10-CM | POA: Diagnosis not present

## 2020-10-21 DIAGNOSIS — K5900 Constipation, unspecified: Secondary | ICD-10-CM | POA: Diagnosis not present

## 2020-10-22 DIAGNOSIS — S32009D Unspecified fracture of unspecified lumbar vertebra, subsequent encounter for fracture with routine healing: Secondary | ICD-10-CM | POA: Diagnosis not present

## 2020-10-22 DIAGNOSIS — I2510 Atherosclerotic heart disease of native coronary artery without angina pectoris: Secondary | ICD-10-CM | POA: Diagnosis not present

## 2020-10-22 DIAGNOSIS — I4891 Unspecified atrial fibrillation: Secondary | ICD-10-CM | POA: Diagnosis not present

## 2020-10-22 DIAGNOSIS — S36892D Contusion of other intra-abdominal organs, subsequent encounter: Secondary | ICD-10-CM | POA: Diagnosis not present

## 2020-10-22 DIAGNOSIS — R52 Pain, unspecified: Secondary | ICD-10-CM | POA: Diagnosis not present

## 2020-10-22 DIAGNOSIS — K5900 Constipation, unspecified: Secondary | ICD-10-CM | POA: Diagnosis not present

## 2020-10-23 DIAGNOSIS — I5082 Biventricular heart failure: Secondary | ICD-10-CM | POA: Insufficient documentation

## 2020-10-23 DIAGNOSIS — S36892A Contusion of other intra-abdominal organs, initial encounter: Secondary | ICD-10-CM | POA: Diagnosis not present

## 2020-10-23 DIAGNOSIS — I5032 Chronic diastolic (congestive) heart failure: Secondary | ICD-10-CM | POA: Insufficient documentation

## 2020-10-23 DIAGNOSIS — I071 Rheumatic tricuspid insufficiency: Secondary | ICD-10-CM | POA: Insufficient documentation

## 2020-10-23 DIAGNOSIS — K5900 Constipation, unspecified: Secondary | ICD-10-CM | POA: Diagnosis not present

## 2020-10-23 DIAGNOSIS — I259 Chronic ischemic heart disease, unspecified: Secondary | ICD-10-CM | POA: Diagnosis not present

## 2020-10-23 DIAGNOSIS — S32009A Unspecified fracture of unspecified lumbar vertebra, initial encounter for closed fracture: Secondary | ICD-10-CM | POA: Diagnosis not present

## 2020-10-24 ENCOUNTER — Other Ambulatory Visit (HOSPITAL_COMMUNITY): Payer: Self-pay | Admitting: Nurse Practitioner

## 2020-10-24 DIAGNOSIS — I4891 Unspecified atrial fibrillation: Secondary | ICD-10-CM | POA: Diagnosis not present

## 2020-10-24 DIAGNOSIS — I50812 Chronic right heart failure: Secondary | ICD-10-CM | POA: Diagnosis not present

## 2020-10-24 DIAGNOSIS — I2510 Atherosclerotic heart disease of native coronary artery without angina pectoris: Secondary | ICD-10-CM | POA: Diagnosis not present

## 2020-10-24 DIAGNOSIS — K5900 Constipation, unspecified: Secondary | ICD-10-CM | POA: Diagnosis not present

## 2020-10-24 DIAGNOSIS — I361 Nonrheumatic tricuspid (valve) insufficiency: Secondary | ICD-10-CM | POA: Diagnosis not present

## 2020-10-24 DIAGNOSIS — D62 Acute posthemorrhagic anemia: Secondary | ICD-10-CM | POA: Diagnosis not present

## 2020-10-24 DIAGNOSIS — S32009A Unspecified fracture of unspecified lumbar vertebra, initial encounter for closed fracture: Secondary | ICD-10-CM | POA: Diagnosis not present

## 2020-10-24 DIAGNOSIS — S36892A Contusion of other intra-abdominal organs, initial encounter: Secondary | ICD-10-CM | POA: Diagnosis not present

## 2020-10-24 DIAGNOSIS — I4821 Permanent atrial fibrillation: Secondary | ICD-10-CM | POA: Diagnosis not present

## 2020-10-25 DIAGNOSIS — W101XXA Fall (on)(from) sidewalk curb, initial encounter: Secondary | ICD-10-CM | POA: Diagnosis not present

## 2020-10-25 DIAGNOSIS — I5082 Biventricular heart failure: Secondary | ICD-10-CM | POA: Diagnosis not present

## 2020-10-25 DIAGNOSIS — J90 Pleural effusion, not elsewhere classified: Secondary | ICD-10-CM | POA: Diagnosis not present

## 2020-10-25 DIAGNOSIS — S3681XA Injury of peritoneum, initial encounter: Secondary | ICD-10-CM | POA: Diagnosis not present

## 2020-10-25 DIAGNOSIS — J9811 Atelectasis: Secondary | ICD-10-CM | POA: Diagnosis not present

## 2020-10-25 DIAGNOSIS — J189 Pneumonia, unspecified organism: Secondary | ICD-10-CM | POA: Diagnosis not present

## 2020-10-25 DIAGNOSIS — I4891 Unspecified atrial fibrillation: Secondary | ICD-10-CM | POA: Diagnosis not present

## 2020-10-25 DIAGNOSIS — R000 Tachycardia, unspecified: Secondary | ICD-10-CM | POA: Diagnosis not present

## 2020-10-25 DIAGNOSIS — I5032 Chronic diastolic (congestive) heart failure: Secondary | ICD-10-CM | POA: Diagnosis not present

## 2020-10-25 DIAGNOSIS — D62 Acute posthemorrhagic anemia: Secondary | ICD-10-CM | POA: Diagnosis not present

## 2020-10-26 DIAGNOSIS — D62 Acute posthemorrhagic anemia: Secondary | ICD-10-CM | POA: Diagnosis not present

## 2020-10-26 DIAGNOSIS — I5032 Chronic diastolic (congestive) heart failure: Secondary | ICD-10-CM | POA: Diagnosis not present

## 2020-10-26 DIAGNOSIS — I4891 Unspecified atrial fibrillation: Secondary | ICD-10-CM | POA: Diagnosis not present

## 2020-10-26 DIAGNOSIS — J189 Pneumonia, unspecified organism: Secondary | ICD-10-CM | POA: Diagnosis not present

## 2020-10-26 DIAGNOSIS — S3681XA Injury of peritoneum, initial encounter: Secondary | ICD-10-CM | POA: Diagnosis not present

## 2020-10-26 DIAGNOSIS — W101XXA Fall (on)(from) sidewalk curb, initial encounter: Secondary | ICD-10-CM | POA: Diagnosis not present

## 2020-10-26 DIAGNOSIS — I5082 Biventricular heart failure: Secondary | ICD-10-CM | POA: Diagnosis not present

## 2020-10-27 DIAGNOSIS — I5032 Chronic diastolic (congestive) heart failure: Secondary | ICD-10-CM | POA: Diagnosis not present

## 2020-10-27 DIAGNOSIS — E785 Hyperlipidemia, unspecified: Secondary | ICD-10-CM | POA: Diagnosis not present

## 2020-10-27 DIAGNOSIS — K219 Gastro-esophageal reflux disease without esophagitis: Secondary | ICD-10-CM | POA: Diagnosis not present

## 2020-10-27 DIAGNOSIS — S3219XA Other fracture of sacrum, initial encounter for closed fracture: Secondary | ICD-10-CM | POA: Diagnosis not present

## 2020-10-27 DIAGNOSIS — I50812 Chronic right heart failure: Secondary | ICD-10-CM | POA: Diagnosis not present

## 2020-10-27 DIAGNOSIS — W101XXA Fall (on)(from) sidewalk curb, initial encounter: Secondary | ICD-10-CM | POA: Diagnosis not present

## 2020-10-27 DIAGNOSIS — S32502A Unspecified fracture of left pubis, initial encounter for closed fracture: Secondary | ICD-10-CM | POA: Diagnosis not present

## 2020-10-27 DIAGNOSIS — S32599A Other specified fracture of unspecified pubis, initial encounter for closed fracture: Secondary | ICD-10-CM | POA: Diagnosis not present

## 2020-10-27 DIAGNOSIS — D62 Acute posthemorrhagic anemia: Secondary | ICD-10-CM | POA: Diagnosis not present

## 2020-10-27 DIAGNOSIS — Z7401 Bed confinement status: Secondary | ICD-10-CM | POA: Diagnosis not present

## 2020-10-27 DIAGNOSIS — R58 Hemorrhage, not elsewhere classified: Secondary | ICD-10-CM | POA: Diagnosis not present

## 2020-10-27 DIAGNOSIS — D649 Anemia, unspecified: Secondary | ICD-10-CM | POA: Diagnosis not present

## 2020-10-27 DIAGNOSIS — I10 Essential (primary) hypertension: Secondary | ICD-10-CM | POA: Diagnosis not present

## 2020-10-27 DIAGNOSIS — N390 Urinary tract infection, site not specified: Secondary | ICD-10-CM | POA: Diagnosis not present

## 2020-10-27 DIAGNOSIS — M6281 Muscle weakness (generalized): Secondary | ICD-10-CM | POA: Diagnosis not present

## 2020-10-27 DIAGNOSIS — R0902 Hypoxemia: Secondary | ICD-10-CM | POA: Diagnosis not present

## 2020-10-27 DIAGNOSIS — S32591A Other specified fracture of right pubis, initial encounter for closed fracture: Secondary | ICD-10-CM | POA: Diagnosis not present

## 2020-10-27 DIAGNOSIS — R41841 Cognitive communication deficit: Secondary | ICD-10-CM | POA: Diagnosis not present

## 2020-10-27 DIAGNOSIS — I2510 Atherosclerotic heart disease of native coronary artery without angina pectoris: Secondary | ICD-10-CM | POA: Diagnosis not present

## 2020-10-27 DIAGNOSIS — I959 Hypotension, unspecified: Secondary | ICD-10-CM | POA: Diagnosis not present

## 2020-10-27 DIAGNOSIS — I4891 Unspecified atrial fibrillation: Secondary | ICD-10-CM | POA: Diagnosis not present

## 2020-10-27 DIAGNOSIS — R296 Repeated falls: Secondary | ICD-10-CM | POA: Diagnosis not present

## 2020-10-27 DIAGNOSIS — R2689 Other abnormalities of gait and mobility: Secondary | ICD-10-CM | POA: Diagnosis not present

## 2020-10-27 DIAGNOSIS — S3210XA Unspecified fracture of sacrum, initial encounter for closed fracture: Secondary | ICD-10-CM | POA: Diagnosis not present

## 2020-10-27 DIAGNOSIS — I4821 Permanent atrial fibrillation: Secondary | ICD-10-CM | POA: Diagnosis not present

## 2020-10-27 DIAGNOSIS — I5082 Biventricular heart failure: Secondary | ICD-10-CM | POA: Diagnosis not present

## 2020-10-27 DIAGNOSIS — I361 Nonrheumatic tricuspid (valve) insufficiency: Secondary | ICD-10-CM | POA: Diagnosis not present

## 2020-10-27 DIAGNOSIS — R279 Unspecified lack of coordination: Secondary | ICD-10-CM | POA: Diagnosis not present

## 2020-10-27 DIAGNOSIS — S3681XA Injury of peritoneum, initial encounter: Secondary | ICD-10-CM | POA: Diagnosis not present

## 2020-10-27 DIAGNOSIS — S32511A Fracture of superior rim of right pubis, initial encounter for closed fracture: Secondary | ICD-10-CM | POA: Diagnosis not present

## 2020-10-27 DIAGNOSIS — J189 Pneumonia, unspecified organism: Secondary | ICD-10-CM | POA: Diagnosis not present

## 2020-10-27 DIAGNOSIS — S32509D Unspecified fracture of unspecified pubis, subsequent encounter for fracture with routine healing: Secondary | ICD-10-CM | POA: Diagnosis not present

## 2020-10-27 DIAGNOSIS — I5022 Chronic systolic (congestive) heart failure: Secondary | ICD-10-CM | POA: Diagnosis not present

## 2020-10-28 DIAGNOSIS — D649 Anemia, unspecified: Secondary | ICD-10-CM | POA: Diagnosis not present

## 2020-10-28 DIAGNOSIS — S32599A Other specified fracture of unspecified pubis, initial encounter for closed fracture: Secondary | ICD-10-CM | POA: Diagnosis not present

## 2020-10-28 DIAGNOSIS — K219 Gastro-esophageal reflux disease without esophagitis: Secondary | ICD-10-CM | POA: Diagnosis not present

## 2020-10-28 DIAGNOSIS — I5022 Chronic systolic (congestive) heart failure: Secondary | ICD-10-CM | POA: Diagnosis not present

## 2020-11-04 ENCOUNTER — Ambulatory Visit: Payer: Medicare HMO | Admitting: Nurse Practitioner

## 2020-11-20 DIAGNOSIS — I4891 Unspecified atrial fibrillation: Secondary | ICD-10-CM | POA: Diagnosis not present

## 2020-11-20 DIAGNOSIS — I10 Essential (primary) hypertension: Secondary | ICD-10-CM | POA: Diagnosis not present

## 2020-11-20 DIAGNOSIS — E785 Hyperlipidemia, unspecified: Secondary | ICD-10-CM | POA: Diagnosis not present

## 2020-11-20 DIAGNOSIS — I5022 Chronic systolic (congestive) heart failure: Secondary | ICD-10-CM | POA: Diagnosis not present

## 2020-11-26 ENCOUNTER — Ambulatory Visit: Payer: Medicare HMO | Admitting: Cardiology

## 2020-12-03 ENCOUNTER — Other Ambulatory Visit: Payer: Self-pay

## 2020-12-03 NOTE — Patient Outreach (Signed)
[facility]) Care Management  12/03/2020  LAPORSHIA HOGEN 08-02-1941 [id number]   Referral Date: 12/03/20 Referral Source: [employer] Report Date of Discharge: 12/02/20 Facility:  [place]: [facility]   Referral received.  No outreach warranted at this time.  Transition of Care calls being completed via EMMI. RN CM will outreach patient for any red flags received.    Plan: RN CM will close case.    Jone Baseman, RN, MSN [facility] Care Management Care Management Coordinator Direct Line [phone] Toll Free: [phone]  Fax: [phone]

## 2020-12-04 ENCOUNTER — Telehealth: Payer: Self-pay | Admitting: *Deleted

## 2020-12-04 DIAGNOSIS — I2510 Atherosclerotic heart disease of native coronary artery without angina pectoris: Secondary | ICD-10-CM | POA: Diagnosis not present

## 2020-12-04 DIAGNOSIS — S32601D Unspecified fracture of right ischium, subsequent encounter for fracture with routine healing: Secondary | ICD-10-CM | POA: Diagnosis not present

## 2020-12-04 DIAGNOSIS — S32501D Unspecified fracture of right pubis, subsequent encounter for fracture with routine healing: Secondary | ICD-10-CM | POA: Diagnosis not present

## 2020-12-04 DIAGNOSIS — S32009D Unspecified fracture of unspecified lumbar vertebra, subsequent encounter for fracture with routine healing: Secondary | ICD-10-CM | POA: Diagnosis not present

## 2020-12-04 DIAGNOSIS — I5033 Acute on chronic diastolic (congestive) heart failure: Secondary | ICD-10-CM | POA: Diagnosis not present

## 2020-12-04 DIAGNOSIS — S3210XD Unspecified fracture of sacrum, subsequent encounter for fracture with routine healing: Secondary | ICD-10-CM | POA: Diagnosis not present

## 2020-12-04 DIAGNOSIS — S32502D Unspecified fracture of left pubis, subsequent encounter for fracture with routine healing: Secondary | ICD-10-CM | POA: Diagnosis not present

## 2020-12-04 DIAGNOSIS — I0981 Rheumatic heart failure: Secondary | ICD-10-CM | POA: Diagnosis not present

## 2020-12-04 DIAGNOSIS — I110 Hypertensive heart disease with heart failure: Secondary | ICD-10-CM | POA: Diagnosis not present

## 2020-12-04 NOTE — Telephone Encounter (Signed)
Contact Date: 12/04/2020 Contacted By: Georgina Pillion, LPN   Transition Care Management Follow-up Telephone Call  Date of discharge and from where: 12/02/20 [facility] [place] Discharge Diagnosis:fractured pelvis   How have you been since you were released from the hospital? Walking slow, needing some help   Any questions or concerns? No   Items Reviewed:  Did the pt receive and understand the discharge instructions provided? Yes   Medications obtained and verified? Yes   Any new allergies since your discharge? No   Dietary orders reviewed? Yes  Do you have support at home? Yes   Discontinued Medications Husband not - sure - he will bring a list to appt   New Medications Added Yes, will bring   Current Medication List Allergies as of 12/04/2020      Reactions   Codeine Nausea And Vomiting   Doxycycline Other (See Comments)   Chest congestion   Triamterene-hctz Other (See Comments)   weakness   Atorvastatin Other (See Comments)   Myalgias   Crestor [rosuvastatin Calcium] Other (See Comments)   weakness   Morphine Nausea Only   Risedronate Sodium Other (See Comments)   ACTONEL - reflux      Medication List       Accurate as of December 04, 2020  2:20 PM. If you have any questions, ask your nurse or doctor.        acetaminophen 500 MG tablet Commonly known as: TYLENOL Take 1,000 mg by mouth 2 (two) times daily.   amiodarone 200 MG tablet Commonly known as: PACERONE TAKE 1 TABLET EVERY DAY   amLODipine 10 MG tablet Commonly known as: NORVASC TAKE 1 TABLET EVERY DAY   cholecalciferol 25 MCG (1000 UNIT) tablet Commonly known as: VITAMIN D3 Take 1,000 Units by mouth daily.   ciprofloxacin 250 MG tablet Commonly known as: Cipro Take 1 tablet (250 mg total) by mouth 2 (two) times daily.   diclofenac Sodium 1 % Gel Commonly known as: VOLTAREN Apply 2 g topically 4 (four) times daily.   Fish Oil 1000 MG Caps Take 1,000 mg by mouth 2 (two) times daily.    loratadine 10 MG tablet Commonly known as: CLARITIN Take 10 mg by mouth daily as needed for allergies.   metoprolol tartrate 100 MG tablet Commonly known as: LOPRESSOR Take 1 tablet (100 mg total) by mouth 2 (two) times daily.   multivitamin capsule Take 1 capsule by mouth every evening.   omeprazole 20 MG capsule Commonly known as: PRILOSEC TAKE 1 CAPSULE EVERY DAY   potassium chloride 10 MEQ tablet Commonly known as: KLOR-CON Take 3 tablets (30 mEq total) by mouth daily with breakfast.   simvastatin 20 MG tablet Commonly known as: ZOCOR TAKE 1 TABLET EVERY DAY   torsemide 20 MG tablet Commonly known as: DEMADEX Take 1 tablet (20 mg total) by mouth daily.   warfarin 3 MG tablet Commonly known as: COUMADIN Take as directed by the anticoagulation clinic. If you are unsure how to take this medication, talk to your nurse or doctor. Original instructions: Take 1/2 to 1 tablet every day as directed        Home Care and Equipment/Supplies: Were home health services ordered? yes If so, what is the name of the agency? [facility]   Has the agency set up a time to come to the patient's home? yes Were any new equipment or medical supplies ordered?  No What is the name of the medical supply agency? n/a Were you able to get  the supplies/equipment? not applicable Do you have any questions related to the use of the equipment or supplies? No Already had commode, walker and ramp  Functional Questionnaire: (I = Independent and D = Dependent) ADLs: D  Bathing/Dressing- D  Meal Prep- D  Eating- D  Maintaining continence- D  Transferring/Ambulation- D  Managing Meds- D  Follow up appointments reviewed:   PCP Hospital f/u appt confirmed? Yes  Scheduled to see jOYCE on 12/09/20 @ 350pm .  [facility] f/u appt confirmed? No    Are transportation arrangements needed? No   If their condition worsens, is the pt aware to call PCP or go to the Emergency Dept.? Yes  Was  the patient provided with contact information for the PCP's office or ED? Yes  Was to pt encouraged to call back with questions or concerns? Yes

## 2020-12-05 ENCOUNTER — Telehealth: Payer: Self-pay

## 2020-12-05 MED ORDER — TORSEMIDE 20 MG PO TABS: 20.0000 mg | ORAL_TABLET | Freq: Every day | ORAL | 2 refills | Status: DC

## 2020-12-05 NOTE — Telephone Encounter (Signed)
  Prescription Request  12/05/2020  What is the name of the medication or equipment? torsemide (DEMADEX) 20 MG tablet    Have you contacted your pharmacy to request a refill? (if applicable) YES  Which pharmacy would you like this sent to? [facility]   Patient notified that their request is being sent to the clinical staff for review and that they should receive a response within 2 business days.

## 2020-12-05 NOTE — Addendum Note (Signed)
Addended by: Loman Brooklyn on: 12/05/2020 12:21 PM   Modules accepted: Orders

## 2020-12-05 NOTE — Telephone Encounter (Signed)
Refill sent. He is a hospitalist.

## 2020-12-08 ENCOUNTER — Ambulatory Visit: Payer: Medicare HMO | Admitting: *Deleted

## 2020-12-08 DIAGNOSIS — I0981 Rheumatic heart failure: Secondary | ICD-10-CM | POA: Diagnosis not present

## 2020-12-08 DIAGNOSIS — Z7689 Persons encountering health services in other specified circumstances: Secondary | ICD-10-CM

## 2020-12-08 DIAGNOSIS — S32601D Unspecified fracture of right ischium, subsequent encounter for fracture with routine healing: Secondary | ICD-10-CM | POA: Diagnosis not present

## 2020-12-08 DIAGNOSIS — S3210XD Unspecified fracture of sacrum, subsequent encounter for fracture with routine healing: Secondary | ICD-10-CM | POA: Diagnosis not present

## 2020-12-08 DIAGNOSIS — I2510 Atherosclerotic heart disease of native coronary artery without angina pectoris: Secondary | ICD-10-CM | POA: Diagnosis not present

## 2020-12-08 DIAGNOSIS — S32501D Unspecified fracture of right pubis, subsequent encounter for fracture with routine healing: Secondary | ICD-10-CM | POA: Diagnosis not present

## 2020-12-08 DIAGNOSIS — I110 Hypertensive heart disease with heart failure: Secondary | ICD-10-CM | POA: Diagnosis not present

## 2020-12-08 DIAGNOSIS — S32502D Unspecified fracture of left pubis, subsequent encounter for fracture with routine healing: Secondary | ICD-10-CM | POA: Diagnosis not present

## 2020-12-08 DIAGNOSIS — I5033 Acute on chronic diastolic (congestive) heart failure: Secondary | ICD-10-CM | POA: Diagnosis not present

## 2020-12-08 DIAGNOSIS — S32009D Unspecified fracture of unspecified lumbar vertebra, subsequent encounter for fracture with routine healing: Secondary | ICD-10-CM | POA: Diagnosis not present

## 2020-12-08 NOTE — Chronic Care Management (AMB) (Signed)
   Care Management Red EMMI Alert 12/08/2020  Received a Red EMMI alert from automated telephone call to patient on 12/07/20.    Outgoing call to patient unsuccessful. Patient had a TOC call by [facility] clinical staff on 12/04/20 and is scheduled for a [facility] f/u with PCP tomorrow. Appointment notes added to refer to embedded CCM program if necessary and desired. Patient does look like a good candidate for CCM services but she refused services in 01/2020.  Chong Sicilian, BSN, RN-BC Embedded Chronic Care Manager [facility] [place] Management Direct Dial: [phone]

## 2020-12-08 NOTE — Patient Instructions (Signed)
Follow-up with [name], FNP as scheduled on 12/09/20. Call PCP office with any questions or concerns [phone] Consider enrolling in Chronic Care Management Services  1. CCM service includes personalized support from designated clinical staff supervised by her physician, including individualized plan of care and coordination with other care providers 2. 24/7 contact phone numbers for assistance for urgent and routine care needs. 3. Service will only be billed when office clinical staff spend 20 minutes or more in a month to coordinate care. 4. Only one practitioner may furnish and bill the service in a calendar month. 5. The patient may stop CCM services at any time (effective at the end of the month) by phone call to the office staff. 6. The patient will be responsible for cost sharing (co-pay) of up to 20% of the service fee (after annual deductible is met).  Chong Sicilian, BSN, RN-BC Embedded Chronic Care Manager [facility] [place] [facility] Direct Dial: [phone]

## 2020-12-09 ENCOUNTER — Ambulatory Visit (INDEPENDENT_AMBULATORY_CARE_PROVIDER_SITE_OTHER): Payer: Medicare HMO | Admitting: Family Medicine

## 2020-12-09 ENCOUNTER — Other Ambulatory Visit: Payer: Self-pay

## 2020-12-09 ENCOUNTER — Other Ambulatory Visit: Payer: Self-pay | Admitting: Family Medicine

## 2020-12-09 ENCOUNTER — Encounter: Payer: Self-pay | Admitting: Family Medicine

## 2020-12-09 ENCOUNTER — Telehealth: Payer: Self-pay

## 2020-12-09 VITALS — BP 97/62 | HR 82 | Temp 97.0°F | Ht 61.0 in | Wt 100.0 lb

## 2020-12-09 DIAGNOSIS — F411 Generalized anxiety disorder: Secondary | ICD-10-CM

## 2020-12-09 DIAGNOSIS — R634 Abnormal weight loss: Secondary | ICD-10-CM | POA: Diagnosis not present

## 2020-12-09 DIAGNOSIS — G479 Sleep disorder, unspecified: Secondary | ICD-10-CM | POA: Diagnosis not present

## 2020-12-09 DIAGNOSIS — R040 Epistaxis: Secondary | ICD-10-CM

## 2020-12-09 DIAGNOSIS — D62 Acute posthemorrhagic anemia: Secondary | ICD-10-CM | POA: Insufficient documentation

## 2020-12-09 DIAGNOSIS — I4891 Unspecified atrial fibrillation: Secondary | ICD-10-CM

## 2020-12-09 DIAGNOSIS — R1011 Right upper quadrant pain: Secondary | ICD-10-CM | POA: Diagnosis not present

## 2020-12-09 DIAGNOSIS — R1319 Other dysphagia: Secondary | ICD-10-CM | POA: Diagnosis not present

## 2020-12-09 DIAGNOSIS — E782 Mixed hyperlipidemia: Secondary | ICD-10-CM

## 2020-12-09 DIAGNOSIS — S32591D Other specified fracture of right pubis, subsequent encounter for fracture with routine healing: Secondary | ICD-10-CM

## 2020-12-09 DIAGNOSIS — F331 Major depressive disorder, recurrent, moderate: Secondary | ICD-10-CM

## 2020-12-09 DIAGNOSIS — K219 Gastro-esophageal reflux disease without esophagitis: Secondary | ICD-10-CM

## 2020-12-09 DIAGNOSIS — I10 Essential (primary) hypertension: Secondary | ICD-10-CM | POA: Diagnosis not present

## 2020-12-09 HISTORY — DX: Major depressive disorder, recurrent, moderate: F33.1

## 2020-12-09 HISTORY — DX: Other specified fracture of right pubis, subsequent encounter for fracture with routine healing: S32.591D

## 2020-12-09 MED ORDER — BUSPIRONE HCL 10 MG PO TABS: 10.0000 mg | ORAL_TABLET | Freq: Two times a day (BID) | ORAL | 1 refills | Status: DC

## 2020-12-09 NOTE — Telephone Encounter (Signed)
Completed.

## 2020-12-09 NOTE — Progress Notes (Addendum)
Assessment & Plan:  1. Closed fracture of ramus of right pubis with routine healing, subsequent encounter - Patient to continue working with physical therapy.   2. Anemia due to acute blood loss - Labs today to assess. - CBC with Differential/Platelet  3. Atrial fibrillation, unspecified type [facility]) - Discussed I will discuss restarting warfarin with cardiology and get back in touch with her.   4. GAD (generalized anxiety disorder) - Patient to continue buspirone. Discussed mirtazapine at bedtime; patient is going to resume.  5. Major depressive disorder, recurrent, moderate (HCC) - Discussed mirtazapine at bedtime; patient is going to resume.  We also discussed this can be increased if needed, but I want her to be taking the 15 mg routinely first.  6. Difficulty sleeping - Patient is going to resume mirtazapine.  7. Esophageal dysphagia - Patient is already established with gastroenterology who previously mentioned an esophageal dilation.  I will call them to see how quickly can get the patient in since she is unable to eat.  8. Unintended weight loss - Encouraged patient to drink boost if she is not going to eat for each meal.  9. Right upper quadrant abdominal pain - Hepatic function panel  10. Epistaxis - Encouraged saline nasal spray and Vaseline to moisten and help the scab heal.  Advised to avoid picking and blowing nose.  Discussed if her nose does bleed she should lean forward and not backwards as this causes the blood to go into her stomach, which is what she threw up previously.  11. Essential hypertension - Well controlled on current regimen.  - Lipid panel - CMP14+EGFR  12. Mixed hyperlipidemia - Labs to assess. - Lipid panel - CMP14+EGFR   Addendum 12/10/2020: Dr. Olevia Perches office is calling patient to offer office visit or if she is agreeable, they will go ahead and set her up for an EGD.   Return in about 6 weeks (around 01/20/2021) for follow-up of  chronic medication conditions.  [name], MSN, APRN, FNP-C [facility]  Subjective:    Patient ID: Norma Barajas, female    DOB: [birth year], 80 y.o.   MRN: [id number]  Patient Care Team: Loman Brooklyn, FNP as PCP - General (Family Medicine) Minus Breeding, MD as PCP - Cardiology (Cardiology) [name], MD (Inactive) as Attending Physician (Gastroenterology) Melina Schools, OD as Consulting Physician (Optometry) Sherran Needs, NP as Consulting Physician (Cardiology)   Chief Complaint:  Chief Complaint  Patient presents with   Transitions Of Care    1/25/22[facility]- fractured pelvis    Anorexia    Patient states that she has not ate in 3 days.  Patient states when she tries to eat she vomits.    HPI: Norma Barajas is a 80 y.o. female presenting on 12/09/2020 for Transitions Of Care (1/25/22[facility] [place]- fractured pelvis/) and Anorexia (Patient states that she has not ate in 3 days.  Patient states when she tries to eat she vomits.)  Patient is accompanied by her husband who she is okay with being present.  Patient was admitted to [facility] from 10/11/2020 to 10/27/2020 after a fall due to multiple pelvic fractures and lumbar spine fracture which were nonoperable.  She was discharged to SNF where she was then discharged last week.  We do not have these records.. During her stay her hemoglobin dropped from 14.5 down to 5.6 due to large pelvic hematomas.  She required multiple transfusions of packed red blood  cells.  Patient also had pneumonia and a UTI while she was hospitalized.  Patient has had numerous medication changes.  Her her hospital discharge summary amiodarone, amlodipine, lisinopril, and warfarin were discontinued.  She was started on digoxin and ferrous sulfate.  Patient's husband reports new medications from SNF are amlodipine, buspirone, and mirtazapine.  Patient has not been taking all of her medications regularly due to  difficulty swallowing.  She admits she is not taking the mirtazapine at bedtime as she does not feel it was helping with her depression.  Depression screen [name] 2/9 12/09/2020 10/01/2020 09/16/2020  Decreased Interest 1 0 0  Down, Depressed, Hopeless 2 0 0  PHQ - 2 Score 3 0 0  Altered sleeping 2 - -  Tired, decreased energy 2 - -  Change in appetite 3 - -  Feeling bad or failure about yourself  0 - -  Trouble concentrating 0 - -  Moving slowly or fidgety/restless 1 - -  Suicidal thoughts 0 - -  PHQ-9 Score 11 - -  Difficult doing work/chores Very difficult - -  Some recent data might be hidden   GAD 7 : Generalized Anxiety Score 12/09/2020 08/08/2020 05/03/2019 04/22/2016  Nervous, Anxious, on Edge _0 Control/stop worrying 1 3 - 1  Worry too much - different things _1 Trouble relaxing 1 3 - 0  Restless 1 3 0 0  Easily annoyed or irritable _2 0  Afraid - awful might happen 1 0 3 0  Total GAD 7 Score 7 18 - 3  Anxiety Difficulty Extremely difficult - Very difficult Not difficult at all    Patient reports she is continuing to lose weight as she is not able to eat.  She states she has not had anything to eat for the past 3 days.  She does try to drink a little bit.  States she is unable to swallow.  Prior to her hospitalization she was seeing Dr. Laural Golden who was considering esophageal dilation.  She tried taking her medications crushed when she was at the SNF, but states it was so disgusting it would almost make her throw up.  Patient is also experiencing nosebleeds from the right nare.  She does have a scab in her nose and admits to picking it off.  She was wearing oxygen when she was in the hospital and at SNF due to pneumonia.  She is no longer wearing oxygen.  She reports vomiting blood during one of her episodes of epistaxis.  Patient does have they are home health.  PT and RN are coming out.  Social history:  Relevant past medical, surgical, family and social history  reviewed and updated as indicated. Interim medical history since our last visit reviewed.  Allergies and medications reviewed and updated.  DATA REVIEWED: CHART IN EPIC  ROS: Negative unless specifically indicated above in HPI.    Current Outpatient Medications:    acetaminophen (TYLENOL) 500 MG tablet, Take 1,000 mg by mouth 2 (two) times daily., Disp: , Rfl:    amLODipine (NORVASC) 10 MG tablet, TAKE 1 TABLET EVERY DAY (Patient taking differently: Take 10 mg by mouth daily.), Disp: 90 tablet, Rfl: 0   busPIRone (BUSPAR) 10 MG tablet, Take 10 mg by mouth 2 (two) times daily., Disp: , Rfl:    cholecalciferol (VITAMIN D3) 25 MCG (1000 UT) tablet, Take 1,000 Units by mouth daily., Disp: , Rfl:    digoxin (LANOXIN) 0.125 MG tablet,  Take 1 tablet by mouth daily at 2 PM., Disp: , Rfl:    ferrous sulfate 325 (65 FE) MG tablet, Take 1 tablet by mouth daily at 2 PM., Disp: , Rfl:    metoprolol tartrate (LOPRESSOR) 50 MG tablet, Take 50 mg by mouth in the morning and at bedtime., Disp: , Rfl:    mirtazapine (REMERON) 15 MG tablet, Take 15 mg by mouth at bedtime., Disp: , Rfl:    Multiple Vitamin (MULTIVITAMIN) capsule, Take 1 capsule by mouth every evening., Disp: , Rfl:    Omega-3 Fatty Acids (FISH OIL) 1000 MG CAPS, Take 1,000 mg by mouth 2 (two) times daily. , Disp: , Rfl:    omeprazole (PRILOSEC) 20 MG capsule, TAKE 1 CAPSULE EVERY DAY, Disp: 90 capsule, Rfl: 0   oxyCODONE (OXY IR/ROXICODONE) 5 MG immediate release tablet, Take 5 mg by mouth every 6 (six) hours as needed for severe pain., Disp: , Rfl:    potassium chloride SA (KLOR-CON) 20 MEQ tablet, Take 20 mEq by mouth 2 (two) times daily., Disp: , Rfl:    simvastatin (ZOCOR) 20 MG tablet, TAKE 1 TABLET EVERY DAY, Disp: 90 tablet, Rfl: 1   torsemide (DEMADEX) 20 MG tablet, Take 1 tablet (20 mg total) by mouth daily., Disp: 30 tablet, Rfl: 2   Allergies  Allergen Reactions   Codeine Nausea And Vomiting   Doxycycline Other  (See Comments)    Chest congestion   Triamterene-Hctz Other (See Comments)    weakness   Atorvastatin Other (See Comments)    Myalgias    Crestor [Rosuvastatin Calcium] Other (See Comments)    weakness   Morphine Nausea Only   Risedronate Sodium Other (See Comments)    ACTONEL - reflux   Past Medical History:  Diagnosis Date   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 Nonobstructive minimal CAD 10/2005.   Depression    Diastolic dysfunction    Grade 1. Ejection fraction 60-65%.   Dysrhythmia    a fib   Erosive esophagitis    Essential hypertension    GERD (gastroesophageal reflux disease)    Grade III hemorrhoids    History of hiatal hernia    Hypercholesterolemia    Internal hemorrhoids with complication 5/59/7416   OCT 2015 FLEX SIG/IH BANDING     Migraine    "used to have them right bad; I don't now" (04/11/2015)   MITRAL REGURGITATION 04/27/2010   Qualifier: Diagnosis of  By: [name], MD, [name]    Osteoporosis    Paroxysmal atrial fibrillation ([place])    PONV (postoperative nausea and vomiting)    Vertigo     Past Surgical History:  Procedure Laterality Date   BRAVO [facility] STUDY  11/15/2012   Procedure: BRAVO [place];  Surgeon: [name], MD;  [facility];  Service: Endoscopy;;   CARDIOVERSION N/A 05/16/2020   Procedure: CARDIOVERSION;  Surgeon: Donato Heinz, MD;  Location: [place];  Service: Cardiovascular;  Laterality: N/A;   CATARACT EXTRACTION W/ INTRAOCULAR LENS  IMPLANT, BILATERAL Bilateral    COLONOSCOPY  2008   Dr. [name]: internal hemorrhoids    DILATION AND CURETTAGE OF UTERUS     ESOPHAGOGASTRODUODENOSCOPY (EGD) WITH ESOPHAGEAL DILATION  2001   Dr. [name]: erosive esophagitis, esophageal stricture,  duodenitis, s/p Savary dilation   ESOPHAGOGASTRODUODENOSCOPY (EGD) WITH ESOPHAGEAL DILATION  11/15/2012   EYC:[id number] web was found & MOST LIKELY CAUSE FOR DYAPHAGIA/Polyp was found in the gastric body and gastric fundus/ gastritis on bx   EYE SURGERY Bilateral    "laser OR after cataract OR; cause I couldn't see"   FLEXIBLE SIGMOIDOSCOPY N/A 08/22/2014   mild diverticulosis in sigmoid, moderate sized Grade 3 hemorrhoids s/p banding X 3.    FRACTURE SURGERY Right    below the knee - 2 bones broke has plates   HEMORRHOID BANDING N/A 08/22/2014   Procedure: HEMORRHOID BANDING;  Surgeon: [name], MD;  [facility];  Service: Endoscopy;  Laterality: N/A;   HEMORRHOID SURGERY N/A 03/30/2018   Procedure: EXTENSIVE HEMORRHOIDECTOMY;  Surgeon: [name], MD;  Location: [facility] OR[facility];  Service: General;  Laterality: N/A;   OPEN REDUCTION INTERNAL FIXATION (ORIF) TIBIA/FIBULA FRACTURE Right 2013   broke tibia and fibula after falling down stairs   PROLAPSED UTERINE FIBROID LIGATION  2015   TOTAL ABDOMINAL HYSTERECTOMY      Social History   Socioeconomic History   Marital status: Married    Spouse name: george    Number of children: 1   Years of education: Not on file   Highest education level: Not on file  Occupational History   Occupation: Retired    Comment: Textile  Tobacco Use   Smoking status: Never Smoker   Smokeless tobacco: Never Used  Scientific laboratory technician Use: Never used  Substance and Sexual Activity   Alcohol use: No    Alcohol/week: 0.0 standard drinks   Drug use: No   Sexual activity: Not Currently    Birth control/protection: Surgical, Post-menopausal    Comment: hyst  Other Topics Concern   Not on file  Social History Narrative   Married   No regular exercise   Social Determinants of Health   Financial Resource Strain: Not on file  Food Insecurity: Not on file  Transportation Needs: Not on file  Physical Activity: Not on  file  Stress: Not on file  Social Connections: Not on file  Intimate Partner Violence: Not on file        Objective:    BP 97/62    Pulse 82    Temp (!) 97 F (36.1 C) (Temporal)    Ht _0  (1.549 m)    Wt 100 lb (45.4 kg)    SpO2 96%    BMI 18.89 kg/m   Wt Readings from Last 3 Encounters:  12/09/20 100 lb (45.4 kg)  10/01/20 112 lb (50.8 kg)  09/25/20 112 lb (50.8 kg)    Physical Exam Vitals reviewed.  Constitutional:      General: She is not in acute distress.    Appearance: Normal appearance. She is underweight. She is not ill-appearing, toxic-appearing or diaphoretic.  HENT:     Head: Normocephalic and atraumatic.  Eyes:     General: No scleral icterus.       Right eye: No discharge.        Left eye: No discharge.     Conjunctiva/sclera: Conjunctivae normal.  Cardiovascular:     Rate and Rhythm: Normal rate and regular rhythm.     Heart sounds: Murmur heard.  No friction rub. No gallop.   Pulmonary:     Effort: Pulmonary effort is normal. No respiratory distress.     Breath sounds: Normal breath sounds. No stridor. No wheezing, rhonchi or rales.  Musculoskeletal:        General: Normal range of motion.     Cervical back: Normal range of motion.  Skin:    General: Skin is warm and dry.     Capillary Refill: Capillary refill takes less than 2 seconds.  Neurological:     General: No focal deficit present.     Mental Status: She is alert and oriented to person, place, and time. Mental status is at baseline.     Gait: Gait abnormal (riding in [facility]).  Psychiatric:        Mood and Affect: Mood normal.        Behavior: Behavior normal.        Thought Content: Thought content normal.        Judgment: Judgment normal.     Lab Results  Component Value Date   TSH 0.805 05/20/2020   Lab Results  Component Value Date   WBC 6.7 09/16/2020   HGB 15.8 09/16/2020   HCT 50.1 (H) 09/16/2020   MCV 97 09/16/2020   PLT 161 09/16/2020   Lab Results  Component Value  Date   NA 141 09/16/2020   K 4.3 09/16/2020   CO2 26 09/16/2020   GLUCOSE 125 (H) 09/16/2020   BUN 19 09/16/2020   CREATININE 1.36 (H) 09/16/2020   BILITOT 0.6 09/25/2020   ALKPHOS 84 09/25/2020   AST 22 09/25/2020   ALT 18 09/25/2020   PROT 6.6 09/25/2020   ALBUMIN 4.3 09/25/2020   CALCIUM 9.2 09/16/2020   ANIONGAP 11 09/12/2020   Lab Results  Component Value Date   CHOL 132 08/05/2020   Lab Results  Component Value Date   HDL 39 (L) 08/05/2020   Lab Results  Component Value Date   LDLCALC 70 08/05/2020   Lab Results  Component Value Date   TRIG 128 08/05/2020   Lab Results  Component Value Date   CHOLHDL 3.4 08/05/2020   Lab Results  Component Value Date   HGBA1C 5.6 02/28/2017

## 2020-12-09 NOTE — Patient Instructions (Signed)
Boost or Ensure if you do not eat a meal.  Saline nasal spray and Vaseline for your nose.

## 2020-12-09 NOTE — Telephone Encounter (Signed)
Pt is scheduled today for appt, medication is under reconcile meds

## 2020-12-09 NOTE — Telephone Encounter (Signed)
  Prescription Request  12/09/2020  What is the name of the medication or equipment? BUSPAR 10MG , 16 TABLETS TAKE ONE TABLET TWICE A DAY   Have you contacted your pharmacy to request a refill? (if applicable) Yes  Which pharmacy would you like this sent to? [facility], pt was getting it from previous PCP   Patient notified that their request is being sent to the clinical staff for review and that they should receive a response within 2 business days.

## 2020-12-10 ENCOUNTER — Other Ambulatory Visit (INDEPENDENT_AMBULATORY_CARE_PROVIDER_SITE_OTHER): Payer: Self-pay

## 2020-12-10 ENCOUNTER — Telehealth (INDEPENDENT_AMBULATORY_CARE_PROVIDER_SITE_OTHER): Payer: Self-pay | Admitting: Internal Medicine

## 2020-12-10 DIAGNOSIS — R1310 Dysphagia, unspecified: Secondary | ICD-10-CM

## 2020-12-10 DIAGNOSIS — S32601D Unspecified fracture of right ischium, subsequent encounter for fracture with routine healing: Secondary | ICD-10-CM | POA: Diagnosis not present

## 2020-12-10 DIAGNOSIS — S3210XD Unspecified fracture of sacrum, subsequent encounter for fracture with routine healing: Secondary | ICD-10-CM | POA: Diagnosis not present

## 2020-12-10 DIAGNOSIS — I2510 Atherosclerotic heart disease of native coronary artery without angina pectoris: Secondary | ICD-10-CM | POA: Diagnosis not present

## 2020-12-10 DIAGNOSIS — S32009D Unspecified fracture of unspecified lumbar vertebra, subsequent encounter for fracture with routine healing: Secondary | ICD-10-CM | POA: Diagnosis not present

## 2020-12-10 DIAGNOSIS — S32501D Unspecified fracture of right pubis, subsequent encounter for fracture with routine healing: Secondary | ICD-10-CM | POA: Diagnosis not present

## 2020-12-10 DIAGNOSIS — R6881 Early satiety: Secondary | ICD-10-CM

## 2020-12-10 DIAGNOSIS — I5033 Acute on chronic diastolic (congestive) heart failure: Secondary | ICD-10-CM | POA: Diagnosis not present

## 2020-12-10 DIAGNOSIS — I0981 Rheumatic heart failure: Secondary | ICD-10-CM | POA: Diagnosis not present

## 2020-12-10 DIAGNOSIS — I110 Hypertensive heart disease with heart failure: Secondary | ICD-10-CM | POA: Diagnosis not present

## 2020-12-10 DIAGNOSIS — S32502D Unspecified fracture of left pubis, subsequent encounter for fracture with routine healing: Secondary | ICD-10-CM | POA: Diagnosis not present

## 2020-12-10 LAB — CBC WITH DIFFERENTIAL/PLATELET
Basophils Absolute: 0.1 10*3/uL (ref 0.0–0.2)
Basos: 1 %
EOS (ABSOLUTE): 0.1 10*3/uL (ref 0.0–0.4)
Eos: 1 %
Hematocrit: 41.3 % (ref 34.0–46.6)
Hemoglobin: 13.9 g/dL (ref 11.1–15.9)
Immature Grans (Abs): 0 10*3/uL (ref 0.0–0.1)
Immature Granulocytes: 0 %
Lymphocytes Absolute: 2.7 10*3/uL (ref 0.7–3.1)
Lymphs: 38 %
MCH: 32.1 pg (ref 26.6–33.0)
MCHC: 33.7 g/dL (ref 31.5–35.7)
MCV: 95 fL (ref 79–97)
Monocytes Absolute: 0.6 10*3/uL (ref 0.1–0.9)
Monocytes: 9 %
Neutrophils Absolute: 3.5 10*3/uL (ref 1.4–7.0)
Neutrophils: 51 %
Platelets: 250 10*3/uL (ref 150–450)
RBC: 4.33 x10E6/uL (ref 3.77–5.28)
RDW: 14.1 % (ref 11.7–15.4)
WBC: 7 10*3/uL (ref 3.4–10.8)

## 2020-12-10 LAB — CMP14+EGFR
ALT: 13 IU/L (ref 0–32)
AST: 22 IU/L (ref 0–40)
Albumin/Globulin Ratio: 0.9 — ABNORMAL LOW (ref 1.2–2.2)
Albumin: 3.4 g/dL — ABNORMAL LOW (ref 3.6–4.6)
Alkaline Phosphatase: 152 IU/L — ABNORMAL HIGH (ref 44–121)
BUN/Creatinine Ratio: 26 (ref 12–28)
BUN: 23 mg/dL (ref 8–27)
Bilirubin Total: 0.8 mg/dL (ref 0.0–1.2)
CO2: 29 mmol/L (ref 20–29)
Calcium: 8.5 mg/dL — ABNORMAL LOW (ref 8.7–10.3)
Chloride: 93 mmol/L — ABNORMAL LOW (ref 96–106)
Creatinine, Ser: 0.9 mg/dL (ref 0.57–1.00)
GFR calc Af Amer: 69 mL/min/{1.73_m2} (ref >59)
GFR calc non Af Amer: 60 mL/min/{1.73_m2} (ref >59)
Globulin, Total: 3.9 g/dL (ref 1.5–4.5)
Glucose: 76 mg/dL (ref 65–99)
Potassium: 3.3 mmol/L — ABNORMAL LOW (ref 3.5–5.2)
Sodium: 139 mmol/L (ref 134–144)
Total Protein: 7.3 g/dL (ref 6.0–8.5)

## 2020-12-10 LAB — LIPID PANEL
Chol/HDL Ratio: 5.5 ratio — ABNORMAL HIGH (ref 0.0–4.4)
Cholesterol, Total: 225 mg/dL — ABNORMAL HIGH (ref 100–199)
HDL: 41 mg/dL (ref >39)
LDL Chol Calc (NIH): 155 mg/dL — ABNORMAL HIGH (ref 0–99)
Triglycerides: 161 mg/dL — ABNORMAL HIGH (ref 0–149)
VLDL Cholesterol Cal: 29 mg/dL (ref 5–40)

## 2020-12-10 LAB — HEPATIC FUNCTION PANEL: Bilirubin, Direct: 0.41 mg/dL — ABNORMAL HIGH (ref 0.00–0.40)

## 2020-12-10 NOTE — Telephone Encounter (Signed)
Delight Ovens NP at [facility] called the office stated she saw patient yesterday - stated patient has been losing weight - she has not eaten anything much because she can't swallow - states she saw where Dr Laural Golden had mentioned doing an esophageal dilation at her last visit - please advise - [place] Joyce's ph# [phone]

## 2020-12-10 NOTE — Telephone Encounter (Signed)
I called and made [place] NP aware that we are awaiting a call from the patient to see if she wants to have an ov to discuss the Egd with Dilation or if she wants [place] to go ahead and schedule the Egd with Dilation.

## 2020-12-10 NOTE — Telephone Encounter (Signed)
Note placed on Dr. Rehman's desk. 

## 2020-12-10 NOTE — Telephone Encounter (Signed)
Norma Barajas would you mind arranging this? Thanks  I spoke with the patient husband and he wants to move forward with EGD with Dilatation. I advised we would have someone from the office to call them with the details.

## 2020-12-10 NOTE — Telephone Encounter (Signed)
I called and left a message for the patient to return call. Per Dr. Laural Golden call the patient and find out if she would like to come in for office visit or to be set up for EGD with Dilation.

## 2020-12-11 NOTE — Telephone Encounter (Signed)
Norma Barajas is scheduled next week for EGD/Ed and they are aware

## 2020-12-11 NOTE — Telephone Encounter (Signed)
Noted  

## 2020-12-12 DIAGNOSIS — I5033 Acute on chronic diastolic (congestive) heart failure: Secondary | ICD-10-CM | POA: Diagnosis not present

## 2020-12-12 DIAGNOSIS — I110 Hypertensive heart disease with heart failure: Secondary | ICD-10-CM | POA: Diagnosis not present

## 2020-12-12 DIAGNOSIS — S32009D Unspecified fracture of unspecified lumbar vertebra, subsequent encounter for fracture with routine healing: Secondary | ICD-10-CM | POA: Diagnosis not present

## 2020-12-12 DIAGNOSIS — I0981 Rheumatic heart failure: Secondary | ICD-10-CM | POA: Diagnosis not present

## 2020-12-12 DIAGNOSIS — S3210XD Unspecified fracture of sacrum, subsequent encounter for fracture with routine healing: Secondary | ICD-10-CM | POA: Diagnosis not present

## 2020-12-12 DIAGNOSIS — S32502D Unspecified fracture of left pubis, subsequent encounter for fracture with routine healing: Secondary | ICD-10-CM | POA: Diagnosis not present

## 2020-12-12 DIAGNOSIS — S32601D Unspecified fracture of right ischium, subsequent encounter for fracture with routine healing: Secondary | ICD-10-CM | POA: Diagnosis not present

## 2020-12-12 DIAGNOSIS — I2510 Atherosclerotic heart disease of native coronary artery without angina pectoris: Secondary | ICD-10-CM | POA: Diagnosis not present

## 2020-12-12 DIAGNOSIS — S32501D Unspecified fracture of right pubis, subsequent encounter for fracture with routine healing: Secondary | ICD-10-CM | POA: Diagnosis not present

## 2020-12-15 ENCOUNTER — Telehealth: Payer: Self-pay | Admitting: Family Medicine

## 2020-12-15 ENCOUNTER — Other Ambulatory Visit (HOSPITAL_COMMUNITY)
Admission: RE | Admit: 2020-12-15 | Discharge: 2020-12-15 | Disposition: A | Discharge status: Home / Self Care | Payer: Medicare HMO | Source: Ambulatory Visit | Attending: Gastroenterology | Admitting: Gastroenterology

## 2020-12-15 ENCOUNTER — Other Ambulatory Visit: Payer: Self-pay

## 2020-12-15 NOTE — Progress Notes (Signed)
Norma Barajas showed up for her covid test today. No one from Dr. Olevia Perches office called her and told her, her procedure had been cancelled.. (Neither her nor her husband remembered they called Fri.Marland Kitchen) Per Rosalyn Gess I called Lacretia Nicks, I had to leave a message with her voicemail. Pt. lives in [place] and said she couldn't keep not eating to prepare for procedure.  I didn't do her COVID test, because I'm not sure when you'll will be able to reschedule her procedure. Spoke with Marylin Crosby who now does Dr. Olevia Perches scheduling now. She said she spoke with her husband this past Friday and told him they didn't have to worry about coming in for her covid test.

## 2020-12-15 NOTE — Telephone Encounter (Signed)
LMTCB  When patient calls back - SCHEDULE a 2 WEEK INR WITH PCP

## 2020-12-15 NOTE — Telephone Encounter (Signed)
Please call patient and schedule a 2-week INR check with me.  I have spoken with patient regarding her Coumadin and discussed the risk of being on Coumadin and having a fall versus the risk of not taking the Coumadin and risk of having a clot from her A-Fib.  Patient would like to resume Coumadin and understands the risk.  She is going to take 3 mg every day this week and then return to 3 mg all days except Tuesday when she does 1.5 mg as this is what she was doing previously.  We will recheck her INR in 2 weeks.

## 2020-12-16 ENCOUNTER — Ambulatory Visit (INDEPENDENT_AMBULATORY_CARE_PROVIDER_SITE_OTHER): Payer: Medicare HMO

## 2020-12-16 ENCOUNTER — Other Ambulatory Visit (INDEPENDENT_AMBULATORY_CARE_PROVIDER_SITE_OTHER): Payer: Self-pay

## 2020-12-16 ENCOUNTER — Telehealth: Payer: Self-pay | Admitting: *Deleted

## 2020-12-16 DIAGNOSIS — I5033 Acute on chronic diastolic (congestive) heart failure: Secondary | ICD-10-CM

## 2020-12-16 DIAGNOSIS — S32501D Unspecified fracture of right pubis, subsequent encounter for fracture with routine healing: Secondary | ICD-10-CM

## 2020-12-16 DIAGNOSIS — K449 Diaphragmatic hernia without obstruction or gangrene: Secondary | ICD-10-CM

## 2020-12-16 DIAGNOSIS — N390 Urinary tract infection, site not specified: Secondary | ICD-10-CM

## 2020-12-16 DIAGNOSIS — M4186 Other forms of scoliosis, lumbar region: Secondary | ICD-10-CM

## 2020-12-16 DIAGNOSIS — S32502D Unspecified fracture of left pubis, subsequent encounter for fracture with routine healing: Secondary | ICD-10-CM

## 2020-12-16 DIAGNOSIS — F32A Depression, unspecified: Secondary | ICD-10-CM

## 2020-12-16 DIAGNOSIS — K5730 Diverticulosis of large intestine without perforation or abscess without bleeding: Secondary | ICD-10-CM

## 2020-12-16 DIAGNOSIS — S3210XD Unspecified fracture of sacrum, subsequent encounter for fracture with routine healing: Secondary | ICD-10-CM

## 2020-12-16 DIAGNOSIS — S32601D Unspecified fracture of right ischium, subsequent encounter for fracture with routine healing: Secondary | ICD-10-CM

## 2020-12-16 DIAGNOSIS — I0981 Rheumatic heart failure: Secondary | ICD-10-CM | POA: Diagnosis not present

## 2020-12-16 DIAGNOSIS — H8110 Benign paroxysmal vertigo, unspecified ear: Secondary | ICD-10-CM

## 2020-12-16 DIAGNOSIS — I2510 Atherosclerotic heart disease of native coronary artery without angina pectoris: Secondary | ICD-10-CM | POA: Diagnosis not present

## 2020-12-16 DIAGNOSIS — S32009D Unspecified fracture of unspecified lumbar vertebra, subsequent encounter for fracture with routine healing: Secondary | ICD-10-CM | POA: Diagnosis not present

## 2020-12-16 DIAGNOSIS — Q394 Esophageal web: Secondary | ICD-10-CM

## 2020-12-16 DIAGNOSIS — J181 Lobar pneumonia, unspecified organism: Secondary | ICD-10-CM

## 2020-12-16 DIAGNOSIS — Z9181 History of falling: Secondary | ICD-10-CM

## 2020-12-16 DIAGNOSIS — I480 Paroxysmal atrial fibrillation: Secondary | ICD-10-CM

## 2020-12-16 DIAGNOSIS — M8588 Other specified disorders of bone density and structure, other site: Secondary | ICD-10-CM

## 2020-12-16 DIAGNOSIS — H269 Unspecified cataract: Secondary | ICD-10-CM

## 2020-12-16 DIAGNOSIS — G43909 Migraine, unspecified, not intractable, without status migrainosus: Secondary | ICD-10-CM

## 2020-12-16 DIAGNOSIS — F411 Generalized anxiety disorder: Secondary | ICD-10-CM

## 2020-12-16 DIAGNOSIS — I700 Atherosclerosis of aorta: Secondary | ICD-10-CM

## 2020-12-16 DIAGNOSIS — I083 Combined rheumatic disorders of mitral, aortic and tricuspid valves: Secondary | ICD-10-CM

## 2020-12-16 DIAGNOSIS — M47816 Spondylosis without myelopathy or radiculopathy, lumbar region: Secondary | ICD-10-CM

## 2020-12-16 DIAGNOSIS — D62 Acute posthemorrhagic anemia: Secondary | ICD-10-CM

## 2020-12-16 DIAGNOSIS — E785 Hyperlipidemia, unspecified: Secondary | ICD-10-CM

## 2020-12-16 DIAGNOSIS — I110 Hypertensive heart disease with heart failure: Secondary | ICD-10-CM | POA: Diagnosis not present

## 2020-12-16 DIAGNOSIS — D3501 Benign neoplasm of right adrenal gland: Secondary | ICD-10-CM

## 2020-12-16 DIAGNOSIS — D630 Anemia in neoplastic disease: Secondary | ICD-10-CM

## 2020-12-16 DIAGNOSIS — R1310 Dysphagia, unspecified: Secondary | ICD-10-CM

## 2020-12-16 DIAGNOSIS — E7800 Pure hypercholesterolemia, unspecified: Secondary | ICD-10-CM

## 2020-12-16 DIAGNOSIS — K2100 Gastro-esophageal reflux disease with esophagitis, without bleeding: Secondary | ICD-10-CM

## 2020-12-16 NOTE — Telephone Encounter (Signed)
I am aware that she is not eating.  She has an EGD scheduled to have an esophageal dilation.  I am not sure why it was moved from this week to next week, but her gastroenterologist is aware that she is not eating.  She is drinking a boost in place of meals.

## 2020-12-16 NOTE — Telephone Encounter (Signed)
FYI : VM from Simona Huh PT w/ [facility] Pt is no longer taking any medications / eating, she is losing weight. BP today was 90/62 She does have a procedure next week

## 2020-12-17 ENCOUNTER — Encounter (HOSPITAL_COMMUNITY): Payer: Self-pay

## 2020-12-17 ENCOUNTER — Ambulatory Visit (HOSPITAL_COMMUNITY): Admit: 2020-12-17 | Payer: Medicare HMO | Admitting: Gastroenterology

## 2020-12-17 ENCOUNTER — Other Ambulatory Visit: Payer: Self-pay | Admitting: Family Medicine

## 2020-12-17 SURGERY — ESOPHAGOGASTRODUODENOSCOPY (EGD) WITH PROPOFOL
Anesthesia: Monitor Anesthesia Care

## 2020-12-18 DIAGNOSIS — S32601D Unspecified fracture of right ischium, subsequent encounter for fracture with routine healing: Secondary | ICD-10-CM | POA: Diagnosis not present

## 2020-12-18 DIAGNOSIS — S32009D Unspecified fracture of unspecified lumbar vertebra, subsequent encounter for fracture with routine healing: Secondary | ICD-10-CM | POA: Diagnosis not present

## 2020-12-18 DIAGNOSIS — S32502D Unspecified fracture of left pubis, subsequent encounter for fracture with routine healing: Secondary | ICD-10-CM | POA: Diagnosis not present

## 2020-12-18 DIAGNOSIS — I2510 Atherosclerotic heart disease of native coronary artery without angina pectoris: Secondary | ICD-10-CM | POA: Diagnosis not present

## 2020-12-18 DIAGNOSIS — S3210XD Unspecified fracture of sacrum, subsequent encounter for fracture with routine healing: Secondary | ICD-10-CM | POA: Diagnosis not present

## 2020-12-18 DIAGNOSIS — I0981 Rheumatic heart failure: Secondary | ICD-10-CM | POA: Diagnosis not present

## 2020-12-18 DIAGNOSIS — S32501D Unspecified fracture of right pubis, subsequent encounter for fracture with routine healing: Secondary | ICD-10-CM | POA: Diagnosis not present

## 2020-12-18 DIAGNOSIS — I110 Hypertensive heart disease with heart failure: Secondary | ICD-10-CM | POA: Diagnosis not present

## 2020-12-18 DIAGNOSIS — I5033 Acute on chronic diastolic (congestive) heart failure: Secondary | ICD-10-CM | POA: Diagnosis not present

## 2020-12-22 ENCOUNTER — Other Ambulatory Visit: Payer: Self-pay

## 2020-12-22 ENCOUNTER — Encounter (HOSPITAL_COMMUNITY): Payer: Self-pay | Admitting: Anesthesiology

## 2020-12-22 ENCOUNTER — Other Ambulatory Visit (HOSPITAL_COMMUNITY)
Admission: RE | Admit: 2020-12-22 | Discharge: 2020-12-22 | Disposition: A | Discharge status: Home / Self Care | Payer: Medicare HMO | Source: Ambulatory Visit | Attending: Gastroenterology | Admitting: Gastroenterology

## 2020-12-22 DIAGNOSIS — Z20822 Contact with and (suspected) exposure to covid-19: Secondary | ICD-10-CM | POA: Insufficient documentation

## 2020-12-22 DIAGNOSIS — Z01812 Encounter for preprocedural laboratory examination: Secondary | ICD-10-CM | POA: Insufficient documentation

## 2020-12-22 LAB — SARS CORONAVIRUS 2 (TAT 6-24 HRS): SARS Coronavirus 2: NEGATIVE

## 2020-12-23 ENCOUNTER — Other Ambulatory Visit: Payer: Self-pay

## 2020-12-23 ENCOUNTER — Ambulatory Visit (HOSPITAL_COMMUNITY)
Admission: RE | Admit: 2020-12-23 | Discharge: 2020-12-23 | Disposition: A | Discharge status: Home / Self Care | Payer: Medicare HMO | Source: Ambulatory Visit | Attending: Gastroenterology | Admitting: Gastroenterology

## 2020-12-23 ENCOUNTER — Encounter (HOSPITAL_COMMUNITY)
Admission: RE | Disposition: A | Discharge status: Home / Self Care | Payer: Self-pay | Source: Ambulatory Visit | Attending: Gastroenterology

## 2020-12-23 DIAGNOSIS — R1310 Dysphagia, unspecified: Secondary | ICD-10-CM

## 2020-12-23 DIAGNOSIS — R6881 Early satiety: Secondary | ICD-10-CM

## 2020-12-23 SURGERY — ESOPHAGOGASTRODUODENOSCOPY (EGD) WITH PROPOFOL
Anesthesia: Monitor Anesthesia Care

## 2020-12-23 MED ORDER — LIDOCAINE VISCOUS HCL 2 % MT SOLN: 15.0000 mL | Freq: Once | OROMUCOSAL | Status: DC

## 2020-12-23 MED ORDER — LACTATED RINGERS IV SOLN: INTRAVENOUS | Status: DC

## 2020-12-23 MED ORDER — CHLORHEXIDINE GLUCONATE CLOTH 2 % EX PADS: 6.0000 | MEDICATED_PAD | Freq: Once | CUTANEOUS | Status: DC

## 2020-12-23 NOTE — Progress Notes (Signed)
Patient brought in to preop area to prepare for her procedure.  When verifying patients medications she informed me that she did take her coumadin on 12/22/2020.  She stated "no one told me to stop it".   Dr. Cherlynn June was informed the patient did not stop her coumadin and he cancelled the EGD/dilation.  I informed Mrs. Flansburg that her procedure was cancelled because she did not stop her coumadin and this was a risk for bleeding with this procedure.  Mrs. Rayburn was irritated and I apologized for the inconvenience.   I informed Mrs. Nelon that Dr. Beverley Fiedler office would contact her to reschedule.  I took Mrs. Gadbois back to the waiting area - retrieved her husband and accompanied them to the main entrance. I explained to Mr. Carton why the procedure was cancelled and informed him that Dr. Beverley Fiedler office would contact them to reschedule.

## 2020-12-24 ENCOUNTER — Other Ambulatory Visit: Payer: Self-pay | Admitting: Family Medicine

## 2020-12-24 DIAGNOSIS — I5033 Acute on chronic diastolic (congestive) heart failure: Secondary | ICD-10-CM | POA: Diagnosis not present

## 2020-12-24 DIAGNOSIS — S32601D Unspecified fracture of right ischium, subsequent encounter for fracture with routine healing: Secondary | ICD-10-CM | POA: Diagnosis not present

## 2020-12-24 DIAGNOSIS — S32502D Unspecified fracture of left pubis, subsequent encounter for fracture with routine healing: Secondary | ICD-10-CM | POA: Diagnosis not present

## 2020-12-24 DIAGNOSIS — S3210XD Unspecified fracture of sacrum, subsequent encounter for fracture with routine healing: Secondary | ICD-10-CM | POA: Diagnosis not present

## 2020-12-24 DIAGNOSIS — I2510 Atherosclerotic heart disease of native coronary artery without angina pectoris: Secondary | ICD-10-CM | POA: Diagnosis not present

## 2020-12-24 DIAGNOSIS — I0981 Rheumatic heart failure: Secondary | ICD-10-CM | POA: Diagnosis not present

## 2020-12-24 DIAGNOSIS — I110 Hypertensive heart disease with heart failure: Secondary | ICD-10-CM | POA: Diagnosis not present

## 2020-12-24 DIAGNOSIS — S32501D Unspecified fracture of right pubis, subsequent encounter for fracture with routine healing: Secondary | ICD-10-CM | POA: Diagnosis not present

## 2020-12-24 DIAGNOSIS — S32009D Unspecified fracture of unspecified lumbar vertebra, subsequent encounter for fracture with routine healing: Secondary | ICD-10-CM | POA: Diagnosis not present

## 2020-12-25 DIAGNOSIS — S32501D Unspecified fracture of right pubis, subsequent encounter for fracture with routine healing: Secondary | ICD-10-CM | POA: Diagnosis not present

## 2020-12-25 DIAGNOSIS — S3210XD Unspecified fracture of sacrum, subsequent encounter for fracture with routine healing: Secondary | ICD-10-CM | POA: Diagnosis not present

## 2020-12-25 DIAGNOSIS — S32502D Unspecified fracture of left pubis, subsequent encounter for fracture with routine healing: Secondary | ICD-10-CM | POA: Diagnosis not present

## 2020-12-25 DIAGNOSIS — I0981 Rheumatic heart failure: Secondary | ICD-10-CM | POA: Diagnosis not present

## 2020-12-25 DIAGNOSIS — S32009D Unspecified fracture of unspecified lumbar vertebra, subsequent encounter for fracture with routine healing: Secondary | ICD-10-CM | POA: Diagnosis not present

## 2020-12-25 DIAGNOSIS — I5033 Acute on chronic diastolic (congestive) heart failure: Secondary | ICD-10-CM | POA: Diagnosis not present

## 2020-12-25 DIAGNOSIS — S32601D Unspecified fracture of right ischium, subsequent encounter for fracture with routine healing: Secondary | ICD-10-CM | POA: Diagnosis not present

## 2020-12-25 DIAGNOSIS — I2510 Atherosclerotic heart disease of native coronary artery without angina pectoris: Secondary | ICD-10-CM | POA: Diagnosis not present

## 2020-12-25 DIAGNOSIS — I110 Hypertensive heart disease with heart failure: Secondary | ICD-10-CM | POA: Diagnosis not present

## 2020-12-30 ENCOUNTER — Encounter: Payer: Self-pay | Admitting: Family Medicine

## 2020-12-30 ENCOUNTER — Other Ambulatory Visit: Payer: Self-pay

## 2020-12-30 ENCOUNTER — Ambulatory Visit (INDEPENDENT_AMBULATORY_CARE_PROVIDER_SITE_OTHER): Payer: Medicare HMO | Admitting: Family Medicine

## 2020-12-30 VITALS — BP 141/68 | HR 71 | Temp 97.4°F | Ht 61.0 in | Wt 101.4 lb

## 2020-12-30 DIAGNOSIS — Z7901 Long term (current) use of anticoagulants: Secondary | ICD-10-CM

## 2020-12-30 DIAGNOSIS — I4891 Unspecified atrial fibrillation: Secondary | ICD-10-CM

## 2020-12-30 LAB — COAGUCHEK XS/INR WAIVED
INR: 2.9 — ABNORMAL HIGH (ref 0.9–1.1)
Prothrombin Time: 34.7 s

## 2020-12-30 NOTE — Progress Notes (Signed)
Assessment & Plan:  1-2. Atrial fibrillation, unspecified type (HCC)/Chronic anticoagulation Description   INR was 2.9 today (goal is 2.0 to 3.0) Continue take 1.5 mg (1/2 tablet) on Tuesdays , 3 mg (1 tablet) the rest of the week.   Re-check in 3 weeks.    - CoaguChek XS/INR Waived   Return as scheduled, for follow-up & INR.  [name], MSN, APRN, FNP-C [facility]  Subjective:    Patient ID: Norma Barajas, female    DOB: Jun 04, 1941, 80 y.o.   MRN: [id number]  Patient Care Team: Loman Brooklyn, FNP as PCP - General (Family Medicine) Minus Breeding, MD as PCP - Cardiology (Cardiology) [name], MD (Inactive) as Attending Physician (Gastroenterology) Melina Schools, OD as Consulting Physician (Optometry) Sherran Needs, NP as Consulting Physician (Cardiology)   Chief Complaint:  Chief Complaint  Patient presents with  . Coagulation Disorder    HPI: Norma Barajas is a 80 y.o. female presenting on 12/30/2020 for Coagulation Disorder  Anticoagulation: Patient here for anticoagulation monitoring. Indication: atrial fibrillation Bleeding Signs/Symptoms:  None Thromboembolic Signs/Symptoms:  None  Missed Coumadin Doses:  None Medication Changes:  Resumed coumadin on 12/15/2020. Dietary Changes:  no Bacterial/Viral Infection:  no  New complaints: None  Social history:  Relevant past medical, surgical, family and social history reviewed and updated as indicated. Interim medical history since our last visit reviewed.  Allergies and medications reviewed and updated.  DATA REVIEWED: CHART IN EPIC  ROS: Negative unless specifically indicated above in HPI.    Current Outpatient Medications:  .  acetaminophen (TYLENOL) 500 MG tablet, Take 1,000 mg by mouth every 6 (six) hours as needed for moderate pain or mild pain., Disp: , Rfl:  .  amLODipine (NORVASC) 10 MG tablet, TAKE 1 TABLET EVERY DAY, Disp: 90 tablet, Rfl: 0 .  busPIRone  (BUSPAR) 10 MG tablet, Take 1 tablet (10 mg total) by mouth 2 (two) times daily., Disp: 180 tablet, Rfl: 1 .  cholecalciferol (VITAMIN D3) 25 MCG (1000 UT) tablet, Take 1,000 Units by mouth daily., Disp: , Rfl:  .  digoxin (LANOXIN) 0.125 MG tablet, Take 0.125 mg by mouth daily at 2 PM., Disp: , Rfl:  .  metoprolol tartrate (LOPRESSOR) 50 MG tablet, Take 50 mg by mouth in the morning and at bedtime., Disp: , Rfl:  .  Multiple Vitamin (MULTIVITAMIN) capsule, Take 1 capsule by mouth daily., Disp: , Rfl:  .  Omega-3 Fatty Acids (FISH OIL) 1000 MG CAPS, Take 1,000 mg by mouth 2 (two) times daily. , Disp: , Rfl:  .  omeprazole (PRILOSEC) 20 MG capsule, TAKE 1 CAPSULE EVERY DAY (Patient taking differently: Take 20 mg by mouth daily.), Disp: 90 capsule, Rfl: 0 .  potassium chloride SA (KLOR-CON) 20 MEQ tablet, Take 20 mEq by mouth daily., Disp: , Rfl:  .  simvastatin (ZOCOR) 20 MG tablet, TAKE 1 TABLET EVERY DAY (Patient taking differently: Take 20 mg by mouth at bedtime.), Disp: 90 tablet, Rfl: 1 .  torsemide (DEMADEX) 20 MG tablet, Take 1 tablet (20 mg total) by mouth daily., Disp: 30 tablet, Rfl: 2 .  warfarin (COUMADIN) 3 MG tablet, Take 3 mg by mouth every day except Tuesdays - take 1.5 mg., Disp: 90 tablet, Rfl: 2   Allergies  Allergen Reactions  . Codeine Nausea And Vomiting  . Doxycycline Other (See Comments)    Chest congestion  . Triamterene-Hctz Other (See Comments)    weakness  . Atorvastatin  Other (See Comments)    Myalgias   . Crestor [Rosuvastatin Calcium] Other (See Comments)    weakness  . Morphine Nausea Only  . Risedronate Sodium Other (See Comments)    ACTONEL - reflux   Past Medical History:  Diagnosis Date  . 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 Anxiety   . Arthritis    "right leg" (04/11/2015)  . BPPV (benign paroxysmal positional vertigo) 03/23/2016  . Cataract   . Coronary atherosclerosis of native coronary artery    a. Nonobstructive minimal CAD  10/2005.  Marland Kitchen Depression   . Diastolic dysfunction    Grade 1. Ejection fraction 60-65%.  Marland Kitchen Dysrhythmia    a fib  . Erosive esophagitis   . Essential hypertension   . GERD (gastroesophageal reflux disease)   . Grade III hemorrhoids   . History of hiatal hernia   . Hypercholesterolemia   . Internal hemorrhoids with complication 4/82/5003   OCT 2015 FLEX SIG/IH BANDING    . Migraine    "used to have them right bad; I don't now" (04/11/2015)  . MITRAL REGURGITATION 04/27/2010   Qualifier: Diagnosis of  By: [name], MD, [name]   . Osteoporosis   . Paroxysmal atrial fibrillation (HCC)   . PONV (postoperative nausea and vomiting)   . Vertigo     Past Surgical History:  Procedure Laterality Date  . BRAVO [place] STUDY  11/15/2012   Procedure: BRAVO [place];  Surgeon: [name], MD;  Location: [facility];  Service: Endoscopy;;  . CARDIOVERSION N/A 05/16/2020   Procedure: CARDIOVERSION;  Surgeon: Donato Heinz, MD;  Location: [facility] ENDOSCOPY;  Service: Cardiovascular;  Laterality: N/A;  . CATARACT EXTRACTION W/ INTRAOCULAR LENS  IMPLANT, BILATERAL Bilateral   . COLONOSCOPY  2008   Dr. [name]: internal hemorrhoids   . DILATION AND CURETTAGE OF UTERUS    . ESOPHAGOGASTRODUODENOSCOPY (EGD) WITH ESOPHAGEAL DILATION  2001   Dr. [name]: erosive esophagitis, esophageal stricture, duodenitis, s/p Savary dilation  . ESOPHAGOGASTRODUODENOSCOPY (EGD) WITH ESOPHAGEAL DILATION  11/15/2012   BCW:[id number] web was found & MOST LIKELY CAUSE FOR DYAPHAGIA/Polyp was found in the gastric body and gastric fundus/ gastritis on bx  . EYE SURGERY Bilateral    "laser OR after cataract OR; cause I couldn't see"  . FLEXIBLE SIGMOIDOSCOPY N/A 08/22/2014   mild diverticulosis in sigmoid, moderate sized Grade 3 hemorrhoids s/p banding X 3.   Marland Kitchen FRACTURE SURGERY Right    below the knee - 2 bones broke has plates  . HEMORRHOID BANDING N/A 08/22/2014   Procedure: HEMORRHOID BANDING;  Surgeon: [name], MD;  Location: [facility];  Service: Endoscopy;  Laterality: N/A;  . HEMORRHOID SURGERY N/A 03/30/2018   Procedure: EXTENSIVE HEMORRHOIDECTOMY;  Surgeon: [name], MD;  Location: [facility] ORS;  Service: General;  Laterality: N/A;  . OPEN REDUCTION INTERNAL FIXATION (ORIF) TIBIA/FIBULA FRACTURE Right 2013   broke tibia and fibula after falling down stairs  . PROLAPSED UTERINE FIBROID LIGATION  2015  . TOTAL ABDOMINAL HYSTERECTOMY      Social History   Socioeconomic History  . Marital status: Married    Spouse name: george   . Number of children: 1  . Years of education: Not on file  . Highest education level: Not on file  Occupational History  . Occupation: Retired    Comment: Textile  Tobacco Use  . Smoking status: Never Smoker  . Smokeless tobacco: Never Used  Vaping Use  .  Vaping Use: Never used  Substance and Sexual Activity  . Alcohol use: No    Alcohol/week: 0.0 standard drinks  . Drug use: No  . Sexual activity: Not Currently    Birth control/protection: Surgical, Post-menopausal    Comment: hyst  Other Topics Concern  . Not on file  Social History Narrative   Married   No regular exercise   Social Determinants of Health   Financial Resource Strain: Not on file  Food Insecurity: Not on file  Transportation Needs: Not on file  Physical Activity: Not on file  Stress: Not on file  Social Connections: Not on file  Intimate Partner Violence: Not on file        Objective:    BP (!) 141/68   Pulse 71   Temp (!) 97.4 F (36.3 C) (Temporal)   Ht 5\' 1"  (1.549 m)   Wt 101 lb 6.4 oz (46 kg)   SpO2 96%   BMI 19.16 kg/m   Wt Readings from Last 3 Encounters:  12/30/20 101 lb 6.4 oz (46 kg)  12/09/20 100 lb (45.4 kg)  10/01/20 112 lb (50.8 kg)    Physical Exam Vitals reviewed.  Constitutional:      General: She is not in acute distress.    Appearance: Normal appearance. She is not ill-appearing, toxic-appearing or diaphoretic.  HENT:      Head: Normocephalic and atraumatic.  Eyes:     General: No scleral icterus.       Right eye: No discharge.        Left eye: No discharge.     Conjunctiva/sclera: Conjunctivae normal.  Cardiovascular:     Rate and Rhythm: Normal rate.  Pulmonary:     Effort: Pulmonary effort is normal. No respiratory distress.  Musculoskeletal:        General: Normal range of motion.     Cervical back: Normal range of motion.  Skin:    General: Skin is warm and dry.     Capillary Refill: Capillary refill takes less than 2 seconds.  Neurological:     General: No focal deficit present.     Mental Status: She is alert and oriented to person, place, and time. Mental status is at baseline.     Gait: Gait abnormal (riding in [facility]).  Psychiatric:        Mood and Affect: Mood normal.        Behavior: Behavior normal.        Thought Content: Thought content normal.        Judgment: Judgment normal.     Comments: Much more chipper today than 3 weeks ago.     Lab Results  Component Value Date   TSH 0.805 05/20/2020   Lab Results  Component Value Date   WBC 7.0 12/09/2020   HGB 13.9 12/09/2020   HCT 41.3 12/09/2020   MCV 95 12/09/2020   PLT 250 12/09/2020   Lab Results  Component Value Date   NA 139 12/09/2020   K 3.3 (L) 12/09/2020   CO2 29 12/09/2020   GLUCOSE 76 12/09/2020   BUN 23 12/09/2020   CREATININE 0.90 12/09/2020   BILITOT 0.8 12/09/2020   ALKPHOS 152 (H) 12/09/2020   AST 22 12/09/2020   ALT 13 12/09/2020   PROT 7.3 12/09/2020   ALBUMIN 3.4 (L) 12/09/2020   CALCIUM 8.5 (L) 12/09/2020   ANIONGAP 11 09/12/2020   Lab Results  Component Value Date   CHOL 225 (H) 12/09/2020   Lab Results  Component Value Date   HDL 41 12/09/2020   Lab Results  Component Value Date   LDLCALC 155 (H) 12/09/2020   Lab Results  Component Value Date   TRIG 161 (H) 12/09/2020   Lab Results  Component Value Date   CHOLHDL 5.5 (H) 12/09/2020   Lab Results  Component Value Date   HGBA1C  5.6 02/28/2017

## 2020-12-31 DIAGNOSIS — S32501D Unspecified fracture of right pubis, subsequent encounter for fracture with routine healing: Secondary | ICD-10-CM | POA: Diagnosis not present

## 2020-12-31 DIAGNOSIS — I2510 Atherosclerotic heart disease of native coronary artery without angina pectoris: Secondary | ICD-10-CM | POA: Diagnosis not present

## 2020-12-31 DIAGNOSIS — I110 Hypertensive heart disease with heart failure: Secondary | ICD-10-CM | POA: Diagnosis not present

## 2020-12-31 DIAGNOSIS — I0981 Rheumatic heart failure: Secondary | ICD-10-CM | POA: Diagnosis not present

## 2020-12-31 DIAGNOSIS — S32601D Unspecified fracture of right ischium, subsequent encounter for fracture with routine healing: Secondary | ICD-10-CM | POA: Diagnosis not present

## 2020-12-31 DIAGNOSIS — S32502D Unspecified fracture of left pubis, subsequent encounter for fracture with routine healing: Secondary | ICD-10-CM | POA: Diagnosis not present

## 2020-12-31 DIAGNOSIS — S32009D Unspecified fracture of unspecified lumbar vertebra, subsequent encounter for fracture with routine healing: Secondary | ICD-10-CM | POA: Diagnosis not present

## 2020-12-31 DIAGNOSIS — S3210XD Unspecified fracture of sacrum, subsequent encounter for fracture with routine healing: Secondary | ICD-10-CM | POA: Diagnosis not present

## 2020-12-31 DIAGNOSIS — I5033 Acute on chronic diastolic (congestive) heart failure: Secondary | ICD-10-CM | POA: Diagnosis not present

## 2021-01-02 DIAGNOSIS — S32601D Unspecified fracture of right ischium, subsequent encounter for fracture with routine healing: Secondary | ICD-10-CM | POA: Diagnosis not present

## 2021-01-02 DIAGNOSIS — S32009D Unspecified fracture of unspecified lumbar vertebra, subsequent encounter for fracture with routine healing: Secondary | ICD-10-CM | POA: Diagnosis not present

## 2021-01-02 DIAGNOSIS — I0981 Rheumatic heart failure: Secondary | ICD-10-CM | POA: Diagnosis not present

## 2021-01-02 DIAGNOSIS — S32501D Unspecified fracture of right pubis, subsequent encounter for fracture with routine healing: Secondary | ICD-10-CM | POA: Diagnosis not present

## 2021-01-02 DIAGNOSIS — S3210XD Unspecified fracture of sacrum, subsequent encounter for fracture with routine healing: Secondary | ICD-10-CM | POA: Diagnosis not present

## 2021-01-02 DIAGNOSIS — S32502D Unspecified fracture of left pubis, subsequent encounter for fracture with routine healing: Secondary | ICD-10-CM | POA: Diagnosis not present

## 2021-01-02 DIAGNOSIS — I2510 Atherosclerotic heart disease of native coronary artery without angina pectoris: Secondary | ICD-10-CM | POA: Diagnosis not present

## 2021-01-02 DIAGNOSIS — I5033 Acute on chronic diastolic (congestive) heart failure: Secondary | ICD-10-CM | POA: Diagnosis not present

## 2021-01-02 DIAGNOSIS — I110 Hypertensive heart disease with heart failure: Secondary | ICD-10-CM | POA: Diagnosis not present

## 2021-01-06 DIAGNOSIS — S32009D Unspecified fracture of unspecified lumbar vertebra, subsequent encounter for fracture with routine healing: Secondary | ICD-10-CM | POA: Diagnosis not present

## 2021-01-06 DIAGNOSIS — I5033 Acute on chronic diastolic (congestive) heart failure: Secondary | ICD-10-CM | POA: Diagnosis not present

## 2021-01-06 DIAGNOSIS — I110 Hypertensive heart disease with heart failure: Secondary | ICD-10-CM | POA: Diagnosis not present

## 2021-01-06 DIAGNOSIS — S32601D Unspecified fracture of right ischium, subsequent encounter for fracture with routine healing: Secondary | ICD-10-CM | POA: Diagnosis not present

## 2021-01-06 DIAGNOSIS — I2510 Atherosclerotic heart disease of native coronary artery without angina pectoris: Secondary | ICD-10-CM | POA: Diagnosis not present

## 2021-01-06 DIAGNOSIS — S3210XD Unspecified fracture of sacrum, subsequent encounter for fracture with routine healing: Secondary | ICD-10-CM | POA: Diagnosis not present

## 2021-01-06 DIAGNOSIS — S32501D Unspecified fracture of right pubis, subsequent encounter for fracture with routine healing: Secondary | ICD-10-CM | POA: Diagnosis not present

## 2021-01-06 DIAGNOSIS — S32502D Unspecified fracture of left pubis, subsequent encounter for fracture with routine healing: Secondary | ICD-10-CM | POA: Diagnosis not present

## 2021-01-06 DIAGNOSIS — I0981 Rheumatic heart failure: Secondary | ICD-10-CM | POA: Diagnosis not present

## 2021-01-08 DIAGNOSIS — I110 Hypertensive heart disease with heart failure: Secondary | ICD-10-CM | POA: Diagnosis not present

## 2021-01-08 DIAGNOSIS — S32502D Unspecified fracture of left pubis, subsequent encounter for fracture with routine healing: Secondary | ICD-10-CM | POA: Diagnosis not present

## 2021-01-08 DIAGNOSIS — I5033 Acute on chronic diastolic (congestive) heart failure: Secondary | ICD-10-CM | POA: Diagnosis not present

## 2021-01-08 DIAGNOSIS — S32009D Unspecified fracture of unspecified lumbar vertebra, subsequent encounter for fracture with routine healing: Secondary | ICD-10-CM | POA: Diagnosis not present

## 2021-01-08 DIAGNOSIS — S3210XD Unspecified fracture of sacrum, subsequent encounter for fracture with routine healing: Secondary | ICD-10-CM | POA: Diagnosis not present

## 2021-01-08 DIAGNOSIS — I2510 Atherosclerotic heart disease of native coronary artery without angina pectoris: Secondary | ICD-10-CM | POA: Diagnosis not present

## 2021-01-08 DIAGNOSIS — S32501D Unspecified fracture of right pubis, subsequent encounter for fracture with routine healing: Secondary | ICD-10-CM | POA: Diagnosis not present

## 2021-01-08 DIAGNOSIS — I0981 Rheumatic heart failure: Secondary | ICD-10-CM | POA: Diagnosis not present

## 2021-01-08 DIAGNOSIS — S32601D Unspecified fracture of right ischium, subsequent encounter for fracture with routine healing: Secondary | ICD-10-CM | POA: Diagnosis not present

## 2021-01-12 DIAGNOSIS — I2510 Atherosclerotic heart disease of native coronary artery without angina pectoris: Secondary | ICD-10-CM | POA: Diagnosis not present

## 2021-01-12 DIAGNOSIS — S32501D Unspecified fracture of right pubis, subsequent encounter for fracture with routine healing: Secondary | ICD-10-CM | POA: Diagnosis not present

## 2021-01-12 DIAGNOSIS — S32601D Unspecified fracture of right ischium, subsequent encounter for fracture with routine healing: Secondary | ICD-10-CM | POA: Diagnosis not present

## 2021-01-12 DIAGNOSIS — S3210XD Unspecified fracture of sacrum, subsequent encounter for fracture with routine healing: Secondary | ICD-10-CM | POA: Diagnosis not present

## 2021-01-12 DIAGNOSIS — S32502D Unspecified fracture of left pubis, subsequent encounter for fracture with routine healing: Secondary | ICD-10-CM | POA: Diagnosis not present

## 2021-01-12 DIAGNOSIS — I5033 Acute on chronic diastolic (congestive) heart failure: Secondary | ICD-10-CM | POA: Diagnosis not present

## 2021-01-12 DIAGNOSIS — I0981 Rheumatic heart failure: Secondary | ICD-10-CM | POA: Diagnosis not present

## 2021-01-12 DIAGNOSIS — S32009D Unspecified fracture of unspecified lumbar vertebra, subsequent encounter for fracture with routine healing: Secondary | ICD-10-CM | POA: Diagnosis not present

## 2021-01-12 DIAGNOSIS — I110 Hypertensive heart disease with heart failure: Secondary | ICD-10-CM | POA: Diagnosis not present

## 2021-01-14 DIAGNOSIS — S3210XD Unspecified fracture of sacrum, subsequent encounter for fracture with routine healing: Secondary | ICD-10-CM | POA: Diagnosis not present

## 2021-01-14 DIAGNOSIS — S32502D Unspecified fracture of left pubis, subsequent encounter for fracture with routine healing: Secondary | ICD-10-CM | POA: Diagnosis not present

## 2021-01-14 DIAGNOSIS — I110 Hypertensive heart disease with heart failure: Secondary | ICD-10-CM | POA: Diagnosis not present

## 2021-01-14 DIAGNOSIS — S32601D Unspecified fracture of right ischium, subsequent encounter for fracture with routine healing: Secondary | ICD-10-CM | POA: Diagnosis not present

## 2021-01-14 DIAGNOSIS — I5033 Acute on chronic diastolic (congestive) heart failure: Secondary | ICD-10-CM | POA: Diagnosis not present

## 2021-01-14 DIAGNOSIS — S32009D Unspecified fracture of unspecified lumbar vertebra, subsequent encounter for fracture with routine healing: Secondary | ICD-10-CM | POA: Diagnosis not present

## 2021-01-14 DIAGNOSIS — S32501D Unspecified fracture of right pubis, subsequent encounter for fracture with routine healing: Secondary | ICD-10-CM | POA: Diagnosis not present

## 2021-01-14 DIAGNOSIS — I2510 Atherosclerotic heart disease of native coronary artery without angina pectoris: Secondary | ICD-10-CM | POA: Diagnosis not present

## 2021-01-14 DIAGNOSIS — I0981 Rheumatic heart failure: Secondary | ICD-10-CM | POA: Diagnosis not present

## 2021-01-20 ENCOUNTER — Other Ambulatory Visit: Payer: Medicare HMO

## 2021-01-20 ENCOUNTER — Other Ambulatory Visit: Payer: Self-pay

## 2021-01-20 DIAGNOSIS — E782 Mixed hyperlipidemia: Secondary | ICD-10-CM

## 2021-01-21 DIAGNOSIS — I5033 Acute on chronic diastolic (congestive) heart failure: Secondary | ICD-10-CM | POA: Diagnosis not present

## 2021-01-21 DIAGNOSIS — S3210XD Unspecified fracture of sacrum, subsequent encounter for fracture with routine healing: Secondary | ICD-10-CM | POA: Diagnosis not present

## 2021-01-21 DIAGNOSIS — S32501D Unspecified fracture of right pubis, subsequent encounter for fracture with routine healing: Secondary | ICD-10-CM | POA: Diagnosis not present

## 2021-01-21 DIAGNOSIS — I110 Hypertensive heart disease with heart failure: Secondary | ICD-10-CM | POA: Diagnosis not present

## 2021-01-21 DIAGNOSIS — I0981 Rheumatic heart failure: Secondary | ICD-10-CM | POA: Diagnosis not present

## 2021-01-21 DIAGNOSIS — I2510 Atherosclerotic heart disease of native coronary artery without angina pectoris: Secondary | ICD-10-CM | POA: Diagnosis not present

## 2021-01-21 DIAGNOSIS — S32009D Unspecified fracture of unspecified lumbar vertebra, subsequent encounter for fracture with routine healing: Secondary | ICD-10-CM | POA: Diagnosis not present

## 2021-01-21 DIAGNOSIS — S32502D Unspecified fracture of left pubis, subsequent encounter for fracture with routine healing: Secondary | ICD-10-CM | POA: Diagnosis not present

## 2021-01-21 DIAGNOSIS — S32601D Unspecified fracture of right ischium, subsequent encounter for fracture with routine healing: Secondary | ICD-10-CM | POA: Diagnosis not present

## 2021-01-22 ENCOUNTER — Encounter: Payer: Self-pay | Admitting: Family Medicine

## 2021-01-22 ENCOUNTER — Other Ambulatory Visit: Payer: Self-pay

## 2021-01-22 ENCOUNTER — Ambulatory Visit (INDEPENDENT_AMBULATORY_CARE_PROVIDER_SITE_OTHER): Payer: Medicare HMO | Admitting: Family Medicine

## 2021-01-22 VITALS — BP 113/65 | HR 61 | Temp 97.7°F | Ht 61.0 in | Wt 105.2 lb

## 2021-01-22 DIAGNOSIS — R1319 Other dysphagia: Secondary | ICD-10-CM

## 2021-01-22 DIAGNOSIS — E559 Vitamin D deficiency, unspecified: Secondary | ICD-10-CM

## 2021-01-22 DIAGNOSIS — I5032 Chronic diastolic (congestive) heart failure: Secondary | ICD-10-CM

## 2021-01-22 DIAGNOSIS — I10 Essential (primary) hypertension: Secondary | ICD-10-CM | POA: Diagnosis not present

## 2021-01-22 DIAGNOSIS — F331 Major depressive disorder, recurrent, moderate: Secondary | ICD-10-CM

## 2021-01-22 DIAGNOSIS — I480 Paroxysmal atrial fibrillation: Secondary | ICD-10-CM

## 2021-01-22 DIAGNOSIS — I5082 Biventricular heart failure: Secondary | ICD-10-CM

## 2021-01-22 DIAGNOSIS — E876 Hypokalemia: Secondary | ICD-10-CM

## 2021-01-22 DIAGNOSIS — E782 Mixed hyperlipidemia: Secondary | ICD-10-CM | POA: Diagnosis not present

## 2021-01-22 DIAGNOSIS — K219 Gastro-esophageal reflux disease without esophagitis: Secondary | ICD-10-CM

## 2021-01-22 DIAGNOSIS — Z7901 Long term (current) use of anticoagulants: Secondary | ICD-10-CM | POA: Diagnosis not present

## 2021-01-22 DIAGNOSIS — I2510 Atherosclerotic heart disease of native coronary artery without angina pectoris: Secondary | ICD-10-CM

## 2021-01-22 DIAGNOSIS — G479 Sleep disorder, unspecified: Secondary | ICD-10-CM

## 2021-01-22 DIAGNOSIS — R634 Abnormal weight loss: Secondary | ICD-10-CM

## 2021-01-22 DIAGNOSIS — S32591D Other specified fracture of right pubis, subsequent encounter for fracture with routine healing: Secondary | ICD-10-CM

## 2021-01-22 DIAGNOSIS — F411 Generalized anxiety disorder: Secondary | ICD-10-CM

## 2021-01-22 LAB — COAGUCHEK XS/INR WAIVED
INR: 2.6 — ABNORMAL HIGH (ref 0.9–1.1)
Prothrombin Time: 31.3 s

## 2021-01-22 MED ORDER — TORSEMIDE 20 MG PO TABS: 20.0000 mg | ORAL_TABLET | Freq: Every day | ORAL | 1 refills | Status: DC

## 2021-01-22 NOTE — Patient Instructions (Signed)
Increase Torsemide to 40 mg (2 tablets) x3 days. Wear compression hose Low salt diet  Miralax 1 capful in 6-8 oz beverage of choice once daily as needed for constipation.    Edema  Edema is when you have too much fluid in your body or under your skin. Edema may make your legs, feet, and ankles swell up. Swelling is also common in looser tissues, like around your eyes. This is a common condition. It gets more common as you get older. There are many possible causes of edema. Eating too much salt (sodium) and being on your feet or sitting for a long time can cause edema in your legs, feet, and ankles. Hot weather may make edema worse. Edema is usually painless. Your skin may look swollen or shiny. Follow these instructions at home:  Keep the swollen body part raised (elevated) above the level of your heart when you are sitting or lying down.  Do not sit still or stand for a long time.  Do not wear tight clothes. Do not wear garters on your upper legs.  Exercise your legs. This can help the swelling go down.  Wear elastic bandages or support stockings as told by your doctor.  Eat a low-salt (low-sodium) diet to reduce fluid as told by your doctor.  Depending on the cause of your swelling, you may need to limit how much fluid you drink (fluid restriction).  Take over-the-counter and prescription medicines only as told by your doctor. Contact a doctor if:  Treatment is not working.  You have heart, liver, or kidney disease and have symptoms of edema.  You have sudden and unexplained weight gain. Get help right away if:  You have shortness of breath or chest pain.  You cannot breathe when you lie down.  You have pain, redness, or warmth in the swollen areas.  You have heart, liver, or kidney disease and get edema all of a sudden.  You have a fever and your symptoms get worse all of a sudden. Summary  Edema is when you have too much fluid in your body or under your  skin.  Edema may make your legs, feet, and ankles swell up. Swelling is also common in looser tissues, like around your eyes.  Raise (elevate) the swollen body part above the level of your heart when you are sitting or lying down.  Follow your doctor's instructions about diet and how much fluid you can drink (fluid restriction). This information is not intended to replace advice given to you by your health care provider. Make sure you discuss any questions you have with your health care provider. Document Revised: 08/20/2020 Document Reviewed: 08/20/2020 Elsevier Patient Education  2021 [employer].

## 2021-01-22 NOTE — Progress Notes (Signed)
Assessment & Plan:  1-2. Chronic anticoagulation/Paroxysmal atrial fibrillation (HCC) Description   INR was 2.6 today (goal is 2.0 to 3.0) Continue taking 1.5 mg (1/2 tablet) on Tuesdays with 3 mg (1 tablet) all other days.  Re-check in 6 weeks.   - CoaguChek XS/INR Waived  3. Essential hypertension Well controlled on current regimen.  - CMP14+EGFR - Lipid panel  4. 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) Edema present. Advised she may increase torsemide to 40 mg x3 days, then resume 20 mg QD. Encouraged to wear compression hose daily. Education provided on edema. - torsemide (DEMADEX) 20 MG tablet; Take 1 tablet (20 mg total) by mouth daily.  Dispense: 90 tablet; Refill: 1 - CMP14+EGFR  5. Hypokalemia Labs to assess. Patient has resumed her potassium supplement. - CMP14+EGFR  6. Atherosclerosis of native coronary artery of native heart without angina pectoris Patient has resumed her statin. - CMP14+EGFR - Lipid panel  7. Mixed hyperlipidemia Labs to assess. Patient has resumed her statin. - CMP14+EGFR - Lipid panel  8. Esophageal dysphagia Patient denies any trouble at this time.  9. Gastroesophageal reflux disease, unspecified whether esophagitis present Well controlled on current regimen.  - CMP14+EGFR  10. Major depressive disorder, recurrent, moderate ([place]) Patient declines need for medication at this time.  - CMP14+EGFR  11. GAD (generalized anxiety disorder) Patient declines need for medication at this time.  - CMP14+EGFR  12. Difficulty sleeping Patient declines need for medication at this time.   13. Unintended weight loss Patient is gaining weight back now that she is eating again.  14. Vitamin D deficiency Labs to assess. - VITAMIN D 25 Hydroxy (Vit-D Deficiency, Fractures)  15. Closed fracture of ramus of right pubis with routine healing, subsequent encounter Healing well. Continue exercises at  home.    Return in about 6 weeks (around 03/05/2021) for INR.  [name], MSN, APRN, FNP-C [facility]  Subjective:    Patient ID: Norma Barajas, female    DOB: Nov 21, 1940, 80 y.o.   MRN: [id number]  Patient Care Team: Loman Brooklyn, FNP as PCP - General (Family Medicine) Minus Breeding, MD as PCP - Cardiology (Cardiology) [name], MD (Inactive) as Attending Physician (Gastroenterology) Melina Schools, OD as Consulting Physician (Optometry) Sherran Needs, NP as Consulting Physician (Cardiology)   Chief Complaint:  Chief Complaint  Patient presents with  . Coagulation Disorder  . Hypertension    Check up of chronic medical conditions   . Edema    Patient states that she has been having bilateral lower leg swelling for a few weeks.  Patient has also been having abdominal swelling x3 days.   . Constipation    X 3 days     HPI: Norma Barajas is a 80 y.o. female presenting on 01/22/2021 for Coagulation Disorder, Hypertension (Check up of chronic medical conditions/), Edema (Patient states that she has been having bilateral lower leg swelling for a few weeks.  Patient has also been having abdominal swelling x3 days. ), and Constipation (X 3 days/)  Patient is accompanied by her husband who she is okay with being present.   Anticoagulation: Patient here for anticoagulation monitoring. Indication: atrial fibrillation Bleeding Signs/Symptoms:  None Thromboembolic Signs/Symptoms:  None  Missed Coumadin Doses:  None Medication Changes:  no Dietary Changes:  no Bacterial/Viral Infection:  no  S/P fracture of pubic ramus: Patient is doing well today. She is walking without a walker. Home health nurse  is still coming out.   Depression/Anxiety: She is no longer taking the mirtazapine or buspirone as she feels she does not need it. She feels she is doing fine without it.   Depression screen [facility] 2/9 01/22/2021 12/09/2020 10/01/2020  Decreased Interest  0 1 0  Down, Depressed, Hopeless 0 2 0  PHQ - 2 Score 0 3 0  Altered sleeping 1 2 -  Tired, decreased energy 1 2 -  Change in appetite 0 3 -  Feeling bad or failure about yourself  0 0 -  Trouble concentrating 0 0 -  Moving slowly or fidgety/restless 1 1 -  Suicidal thoughts 0 0 -  PHQ-9 Score 3 11 -  Difficult doing work/chores Not difficult at all Very difficult -  Some recent data might be hidden   GAD 7 : Generalized Anxiety Score 01/22/2021 12/09/2020 08/08/2020 05/03/2019  Nervous, Anxious, on Edge 1 1 3 3   Control/stop worrying 0 1 3 -  Worry too much - different things 0 1 3 3   Trouble relaxing 1 1 3  -  Restless 0 1 3 0  Easily annoyed or irritable 1 1 3 3   Afraid - awful might happen 0 1 0 3  Total GAD 7 Score 3 7 18  -  Anxiety Difficulty Somewhat difficult Extremely difficult - Very difficult    Dysphagia: patient reports she is eating and taking her medications again without problems. She had previously lost weight due to her hospitalization and not eating (17 lbs total). She is now gaining weight back (which she is unhappy about).   New complaints: Patient reports bilateral edema in her feet and legs. She reports she keeps them up on the couch and eats a low salt diet. She is not wearing compression hose. She is taking her torsemide daily.   She has been having trouble with constipation. Her last BM was Monday until she took milk of magnesia yesterday, which resulted in a BM this morning.   Social history:  Relevant past medical, surgical, family and social history reviewed and updated as indicated. Interim medical history since our last visit reviewed.  Allergies and medications reviewed and updated.  DATA REVIEWED: CHART IN EPIC  ROS: Negative unless specifically indicated above in HPI.    Current Outpatient Medications:  .  acetaminophen (TYLENOL) 500 MG tablet, Take 1,000 mg by mouth every 6 (six) hours as needed for moderate pain or mild pain., Disp: , Rfl:   .  amLODipine (NORVASC) 10 MG tablet, TAKE 1 TABLET EVERY DAY, Disp: 90 tablet, Rfl: 0 .  busPIRone (BUSPAR) 10 MG tablet, Take 1 tablet (10 mg total) by mouth 2 (two) times daily., Disp: 180 tablet, Rfl: 1 .  cholecalciferol (VITAMIN D3) 25 MCG (1000 UT) tablet, Take 1,000 Units by mouth daily., Disp: , Rfl:  .  digoxin (LANOXIN) 0.125 MG tablet, Take 0.125 mg by mouth daily at 2 PM., Disp: , Rfl:  .  metoprolol tartrate (LOPRESSOR) 50 MG tablet, Take 50 mg by mouth in the morning and at bedtime., Disp: , Rfl:  .  Multiple Vitamin (MULTIVITAMIN) capsule, Take 1 capsule by mouth daily., Disp: , Rfl:  .  Omega-3 Fatty Acids (FISH OIL) 1000 MG CAPS, Take 1,000 mg by mouth 2 (two) times daily. , Disp: , Rfl:  .  omeprazole (PRILOSEC) 20 MG capsule, TAKE 1 CAPSULE EVERY DAY (Patient taking differently: Take 20 mg by mouth daily.), Disp: 90 capsule, Rfl: 0 .  potassium chloride SA (KLOR-CON) 20 MEQ  tablet, Take 20 mEq by mouth daily., Disp: , Rfl:  .  simvastatin (ZOCOR) 20 MG tablet, TAKE 1 TABLET EVERY DAY (Patient taking differently: Take 20 mg by mouth at bedtime.), Disp: 90 tablet, Rfl: 1 .  torsemide (DEMADEX) 20 MG tablet, Take 1 tablet (20 mg total) by mouth daily., Disp: 30 tablet, Rfl: 2 .  warfarin (COUMADIN) 3 MG tablet, Take 3 mg by mouth every day except Tuesdays - take 1.5 mg., Disp: 90 tablet, Rfl: 2   Allergies  Allergen Reactions  . Codeine Nausea And Vomiting  . Doxycycline Other (See Comments)    Chest congestion  . Triamterene-Hctz Other (See Comments)    weakness  . Atorvastatin Other (See Comments)    Myalgias   . Crestor [Rosuvastatin Calcium] Other (See Comments)    weakness  . Morphine Nausea Only  . Risedronate Sodium Other (See Comments)    ACTONEL - reflux   Past Medical History:  Diagnosis Date  . 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 Anxiety   . Arthritis    "right leg" (04/11/2015)  . BPPV (benign paroxysmal positional vertigo) 03/23/2016  .  Cataract   . Coronary atherosclerosis of native coronary artery    a. Nonobstructive minimal CAD 10/2005.  Marland Kitchen Depression   . Diastolic dysfunction    Grade 1. Ejection fraction 60-65%.  Marland Kitchen Dysrhythmia    a fib  . Erosive esophagitis   . Essential hypertension   . GERD (gastroesophageal reflux disease)   . Grade III hemorrhoids   . History of hiatal hernia   . Hypercholesterolemia   . Internal hemorrhoids with complication 2/53/6644   OCT 2015 FLEX SIG/IH BANDING    . Migraine    "used to have them right bad; I don't now" (04/11/2015)  . MITRAL REGURGITATION 04/27/2010   Qualifier: Diagnosis of  By: [name], MD, [name]   . Osteoporosis   . Paroxysmal atrial fibrillation (HCC)   . PONV (postoperative nausea and vomiting)   . Vertigo     Past Surgical History:  Procedure Laterality Date  . BRAVO [place] STUDY  11/15/2012   Procedure: BRAVO [place];  Surgeon: [name], MD;  Location: [facility];  Service: Endoscopy;;  . CARDIOVERSION N/A 05/16/2020   Procedure: CARDIOVERSION;  Surgeon: Donato Heinz, MD;  Location: [facility] ENDOSCOPY;  Service: Cardiovascular;  Laterality: N/A;  . CATARACT EXTRACTION W/ INTRAOCULAR LENS  IMPLANT, BILATERAL Bilateral   . COLONOSCOPY  2008   Dr. [name]: internal hemorrhoids   . DILATION AND CURETTAGE OF UTERUS    . ESOPHAGOGASTRODUODENOSCOPY (EGD) WITH ESOPHAGEAL DILATION  2001   Dr. [name]: erosive esophagitis, esophageal stricture, duodenitis, s/p Savary dilation  . ESOPHAGOGASTRODUODENOSCOPY (EGD) WITH ESOPHAGEAL DILATION  11/15/2012   IHK:[id number] web was found & MOST LIKELY CAUSE FOR DYAPHAGIA/Polyp was found in the gastric body and gastric fundus/ gastritis on bx  . EYE SURGERY Bilateral    "laser OR after cataract OR; cause I couldn't see"  . FLEXIBLE SIGMOIDOSCOPY N/A 08/22/2014   mild diverticulosis in sigmoid, moderate sized Grade 3 hemorrhoids s/p banding X 3.   Marland Kitchen FRACTURE SURGERY Right    below the knee - 2 bones broke  has plates  . HEMORRHOID BANDING N/A 08/22/2014   Procedure: HEMORRHOID BANDING;  Surgeon: [name], MD;  Location: [facility] END[facility] SU[facility];  Service: Endoscopy;  Laterality: N/A;  . HEMORRHOID SURGERY N/A 03/30/2018   Procedure: EXTENSIVE HEMORRHOIDECTOMY;  Surgeon: [name],  MD;  Location: [facility] ORS;  Service: General;  Laterality: N/A;  . OPEN REDUCTION INTERNAL FIXATION (ORIF) TIBIA/FIBULA FRACTURE Right 2013   broke tibia and fibula after falling down stairs  . PROLAPSED UTERINE FIBROID LIGATION  2015  . TOTAL ABDOMINAL HYSTERECTOMY      Social History   Socioeconomic History  . Marital status: Married    Spouse name: george   . Number of children: 1  . Years of education: Not on file  . Highest education level: Not on file  Occupational History  . Occupation: Retired    Comment: Textile  Tobacco Use  . Smoking status: Never Smoker  . Smokeless tobacco: Never Used  Vaping Use  . Vaping Use: Never used  Substance and Sexual Activity  . Alcohol use: No    Alcohol/week: 0.0 standard drinks  . Drug use: No  . Sexual activity: Not Currently    Birth control/protection: Surgical, Post-menopausal    Comment: hyst  Other Topics Concern  . Not on file  Social History Narrative   Married   No regular exercise   Social Determinants of Health   Financial Resource Strain: Not on file  Food Insecurity: Not on file  Transportation Needs: Not on file  Physical Activity: Not on file  Stress: Not on file  Social Connections: Not on file  Intimate Partner Violence: Not on file        Objective:    BP 113/65   Pulse 61   Temp 97.7 F (36.5 C) (Temporal)   Ht 5' 1"  (1.549 m)   Wt 105 lb 3.2 oz (47.7 kg)   SpO2 96%   BMI 19.88 kg/m   Wt Readings from Last 3 Encounters:  01/22/21 105 lb 3.2 oz (47.7 kg)  12/30/20 101 lb 6.4 oz (46 kg)  12/09/20 100 lb (45.4 kg)    Physical Exam Vitals reviewed.  Constitutional:      General: She is not in acute distress.     Appearance: Normal appearance. She is underweight. She is not ill-appearing, toxic-appearing or diaphoretic.  HENT:     Head: Normocephalic and atraumatic.     Right Ear: Tympanic membrane, ear canal and external ear normal. There is no impacted cerumen.     Left Ear: Tympanic membrane, ear canal and external ear normal. There is no impacted cerumen.     Nose: Nose normal. No congestion or rhinorrhea.     Mouth/Throat:     Mouth: Mucous membranes are moist.     Pharynx: Oropharynx is clear. No oropharyngeal exudate or posterior oropharyngeal erythema.  Eyes:     General: No scleral icterus.       Right eye: No discharge.        Left eye: No discharge.     Conjunctiva/sclera: Conjunctivae normal.     Pupils: Pupils are equal, round, and reactive to light.  Cardiovascular:     Rate and Rhythm: Normal rate and regular rhythm.     Heart sounds: Normal heart sounds. No murmur heard. No friction rub. No gallop.   Pulmonary:     Effort: Pulmonary effort is normal. No respiratory distress.     Breath sounds: Normal breath sounds. No stridor. No wheezing, rhonchi or rales.  Abdominal:     General: Abdomen is flat. Bowel sounds are normal. There is no distension.     Palpations: Abdomen is soft. There is no mass.     Tenderness: There is no abdominal tenderness. There is no guarding  or rebound.     Hernia: No hernia is present.     Comments: Exam limited to chair as she was not safe getting on exam table.  Musculoskeletal:        General: Normal range of motion.     Cervical back: Normal range of motion and neck supple. No rigidity. No muscular tenderness.  Lymphadenopathy:     Cervical: No cervical adenopathy.  Skin:    General: Skin is warm and dry.     Capillary Refill: Capillary refill takes less than 2 seconds.  Neurological:     General: No focal deficit present.     Mental Status: She is alert and oriented to person, place, and time. Mental status is at baseline.  Psychiatric:         Mood and Affect: Mood normal.        Behavior: Behavior normal.        Thought Content: Thought content normal.        Judgment: Judgment normal.     Lab Results  Component Value Date   TSH 0.805 05/20/2020   Lab Results  Component Value Date   WBC 7.0 12/09/2020   HGB 13.9 12/09/2020   HCT 41.3 12/09/2020   MCV 95 12/09/2020   PLT 250 12/09/2020   Lab Results  Component Value Date   NA 139 12/09/2020   K 3.3 (L) 12/09/2020   CO2 29 12/09/2020   GLUCOSE 76 12/09/2020   BUN 23 12/09/2020   CREATININE 0.90 12/09/2020   BILITOT 0.8 12/09/2020   ALKPHOS 152 (H) 12/09/2020   AST 22 12/09/2020   ALT 13 12/09/2020   PROT 7.3 12/09/2020   ALBUMIN 3.4 (L) 12/09/2020   CALCIUM 8.5 (L) 12/09/2020   ANIONGAP 11 09/12/2020   Lab Results  Component Value Date   CHOL 225 (H) 12/09/2020   Lab Results  Component Value Date   HDL 41 12/09/2020   Lab Results  Component Value Date   LDLCALC 155 (H) 12/09/2020   Lab Results  Component Value Date   TRIG 161 (H) 12/09/2020   Lab Results  Component Value Date   CHOLHDL 5.5 (H) 12/09/2020   Lab Results  Component Value Date   HGBA1C 5.6 02/28/2017

## 2021-01-23 LAB — CMP14+EGFR
ALT: 21 IU/L (ref 0–32)
AST: 28 IU/L (ref 0–40)
Albumin/Globulin Ratio: 1.2 (ref 1.2–2.2)
Albumin: 3.6 g/dL (ref 3.6–4.6)
Alkaline Phosphatase: 152 IU/L — ABNORMAL HIGH (ref 44–121)
BUN/Creatinine Ratio: 18 (ref 12–28)
BUN: 14 mg/dL (ref 8–27)
Bilirubin Total: 0.4 mg/dL (ref 0.0–1.2)
CO2: 24 mmol/L (ref 20–29)
Calcium: 8.6 mg/dL — ABNORMAL LOW (ref 8.7–10.3)
Chloride: 98 mmol/L (ref 96–106)
Creatinine, Ser: 0.77 mg/dL (ref 0.57–1.00)
Globulin, Total: 3 g/dL (ref 1.5–4.5)
Glucose: 89 mg/dL (ref 65–99)
Potassium: 4.4 mmol/L (ref 3.5–5.2)
Sodium: 139 mmol/L (ref 134–144)
Total Protein: 6.6 g/dL (ref 6.0–8.5)
eGFR: 77 mL/min/{1.73_m2} (ref >59)

## 2021-01-23 LAB — LIPID PANEL
Chol/HDL Ratio: 3.3 ratio (ref 0.0–4.4)
Cholesterol, Total: 164 mg/dL (ref 100–199)
HDL: 49 mg/dL (ref >39)
LDL Chol Calc (NIH): 90 mg/dL (ref 0–99)
Triglycerides: 142 mg/dL (ref 0–149)
VLDL Cholesterol Cal: 25 mg/dL (ref 5–40)

## 2021-01-23 LAB — VITAMIN D 25 HYDROXY (VIT D DEFICIENCY, FRACTURES): Vit D, 25-Hydroxy: 61.9 ng/mL (ref 30.0–100.0)

## 2021-01-26 DIAGNOSIS — I5033 Acute on chronic diastolic (congestive) heart failure: Secondary | ICD-10-CM | POA: Diagnosis not present

## 2021-01-26 DIAGNOSIS — I110 Hypertensive heart disease with heart failure: Secondary | ICD-10-CM | POA: Diagnosis not present

## 2021-01-26 DIAGNOSIS — S32502D Unspecified fracture of left pubis, subsequent encounter for fracture with routine healing: Secondary | ICD-10-CM | POA: Diagnosis not present

## 2021-01-26 DIAGNOSIS — S32009D Unspecified fracture of unspecified lumbar vertebra, subsequent encounter for fracture with routine healing: Secondary | ICD-10-CM | POA: Diagnosis not present

## 2021-01-26 DIAGNOSIS — I2510 Atherosclerotic heart disease of native coronary artery without angina pectoris: Secondary | ICD-10-CM | POA: Diagnosis not present

## 2021-01-26 DIAGNOSIS — S3210XD Unspecified fracture of sacrum, subsequent encounter for fracture with routine healing: Secondary | ICD-10-CM | POA: Diagnosis not present

## 2021-01-26 DIAGNOSIS — S32501D Unspecified fracture of right pubis, subsequent encounter for fracture with routine healing: Secondary | ICD-10-CM | POA: Diagnosis not present

## 2021-01-26 DIAGNOSIS — S32601D Unspecified fracture of right ischium, subsequent encounter for fracture with routine healing: Secondary | ICD-10-CM | POA: Diagnosis not present

## 2021-01-26 DIAGNOSIS — I0981 Rheumatic heart failure: Secondary | ICD-10-CM | POA: Diagnosis not present

## 2021-01-28 DIAGNOSIS — S32502D Unspecified fracture of left pubis, subsequent encounter for fracture with routine healing: Secondary | ICD-10-CM | POA: Diagnosis not present

## 2021-01-28 DIAGNOSIS — S3210XD Unspecified fracture of sacrum, subsequent encounter for fracture with routine healing: Secondary | ICD-10-CM | POA: Diagnosis not present

## 2021-01-28 DIAGNOSIS — I5033 Acute on chronic diastolic (congestive) heart failure: Secondary | ICD-10-CM | POA: Diagnosis not present

## 2021-01-28 DIAGNOSIS — S32501D Unspecified fracture of right pubis, subsequent encounter for fracture with routine healing: Secondary | ICD-10-CM | POA: Diagnosis not present

## 2021-01-28 DIAGNOSIS — I2510 Atherosclerotic heart disease of native coronary artery without angina pectoris: Secondary | ICD-10-CM | POA: Diagnosis not present

## 2021-01-28 DIAGNOSIS — S32601D Unspecified fracture of right ischium, subsequent encounter for fracture with routine healing: Secondary | ICD-10-CM | POA: Diagnosis not present

## 2021-01-28 DIAGNOSIS — I110 Hypertensive heart disease with heart failure: Secondary | ICD-10-CM | POA: Diagnosis not present

## 2021-01-28 DIAGNOSIS — I0981 Rheumatic heart failure: Secondary | ICD-10-CM | POA: Diagnosis not present

## 2021-01-28 DIAGNOSIS — S32009D Unspecified fracture of unspecified lumbar vertebra, subsequent encounter for fracture with routine healing: Secondary | ICD-10-CM | POA: Diagnosis not present

## 2021-02-12 DIAGNOSIS — Z0100 Encounter for examination of eyes and vision without abnormal findings: Secondary | ICD-10-CM | POA: Diagnosis not present

## 2021-02-12 DIAGNOSIS — E7800 Pure hypercholesterolemia, unspecified: Secondary | ICD-10-CM | POA: Diagnosis not present

## 2021-02-12 DIAGNOSIS — H5210 Myopia, unspecified eye: Secondary | ICD-10-CM | POA: Diagnosis not present

## 2021-02-16 ENCOUNTER — Other Ambulatory Visit: Payer: Self-pay | Admitting: Family Medicine

## 2021-02-16 DIAGNOSIS — K219 Gastro-esophageal reflux disease without esophagitis: Secondary | ICD-10-CM

## 2021-02-18 ENCOUNTER — Other Ambulatory Visit: Payer: Self-pay | Admitting: Family Medicine

## 2021-03-03 ENCOUNTER — Encounter: Payer: Self-pay | Admitting: Family Medicine

## 2021-03-03 ENCOUNTER — Other Ambulatory Visit: Payer: Self-pay

## 2021-03-03 ENCOUNTER — Ambulatory Visit (INDEPENDENT_AMBULATORY_CARE_PROVIDER_SITE_OTHER): Payer: Medicare HMO | Admitting: Family Medicine

## 2021-03-03 VITALS — BP 120/68 | HR 63 | Temp 97.1°F | Ht 61.0 in | Wt 109.0 lb

## 2021-03-03 DIAGNOSIS — Z7901 Long term (current) use of anticoagulants: Secondary | ICD-10-CM | POA: Diagnosis not present

## 2021-03-03 DIAGNOSIS — L219 Seborrheic dermatitis, unspecified: Secondary | ICD-10-CM | POA: Diagnosis not present

## 2021-03-03 DIAGNOSIS — I480 Paroxysmal atrial fibrillation: Secondary | ICD-10-CM

## 2021-03-03 LAB — COAGUCHEK XS/INR WAIVED
INR: 1.4 — ABNORMAL HIGH (ref 0.9–1.1)
Prothrombin Time: 17.4 s

## 2021-03-03 MED ORDER — KETOCONAZOLE 2 % EX SHAM: 1.0000 "application " | MEDICATED_SHAMPOO | CUTANEOUS | 0 refills | Status: DC

## 2021-03-03 NOTE — Progress Notes (Signed)
Assessment & Plan:  1-2. Paroxysmal atrial fibrillation (HCC)/Chronic anticoagulation Description   INR was 1.4 today (goal is 2.0 to 3.0) Take an extra half tablet today (so a whole tablet), then resume taking 1.5 mg (1/2 tablet) on Tuesdays with 3 mg (1 tablet) all other days.  Re-check next week after Tuesday.   - CoaguChek XS/INR Waived  3. Seborrheic dermatitis of scalp - ketoconazole (NIZORAL) 2 % shampoo; Apply 1 application topically 2 (two) times a week.  Dispense: 120 mL; Refill: 0   Return in about 8 days (around 03/11/2021) for INR.  [name], MSN, APRN, FNP-C [facility]  Subjective:    Patient ID: Norma Barajas, female    DOB: 16-Nov-1940, 80 y.o.   MRN: [id number]  Patient Care Team: Loman Brooklyn, FNP as PCP - General (Family Medicine) Minus Breeding, MD as PCP - Cardiology (Cardiology) [name], MD (Inactive) as Attending Physician (Gastroenterology) Melina Schools, OD as Consulting Physician (Optometry) Sherran Needs, NP as Consulting Physician (Cardiology)   Chief Complaint:  Chief Complaint  Patient presents with  . Coagulation Disorder    HPI: Norma Barajas is a 80 y.o. female presenting on 03/03/2021 for Coagulation Disorder  Anticoagulation: Patient here for anticoagulation monitoring. Indication: atrial fibrillation Bleeding Signs/Symptoms:  None Thromboembolic Signs/Symptoms:  None  Missed Coumadin Doses:  None Medication Changes:  no Dietary Changes:  no Bacterial/Viral Infection:  no   New complaints: Patient reports something is going on with her scalp.   Social history:  Relevant past medical, surgical, family and social history reviewed and updated as indicated. Interim medical history since our last visit reviewed.  Allergies and medications reviewed and updated.  DATA REVIEWED: CHART IN EPIC  ROS: Negative unless specifically indicated above in HPI.    Current Outpatient Medications:  .   acetaminophen (TYLENOL) 500 MG tablet, Take 1,000 mg by mouth every 6 (six) hours as needed for moderate pain or mild pain., Disp: , Rfl:  .  amLODipine (NORVASC) 10 MG tablet, TAKE 1 TABLET EVERY DAY, Disp: 90 tablet, Rfl: 0 .  cholecalciferol (VITAMIN D3) 25 MCG (1000 UT) tablet, Take 1,000 Units by mouth daily., Disp: , Rfl:  .  metoprolol tartrate (LOPRESSOR) 50 MG tablet, Take 50 mg by mouth in the morning and at bedtime., Disp: , Rfl:  .  Multiple Vitamin (MULTIVITAMIN) capsule, Take 1 capsule by mouth daily., Disp: , Rfl:  .  Omega-3 Fatty Acids (FISH OIL) 1000 MG CAPS, Take 1,000 mg by mouth 2 (two) times daily. , Disp: , Rfl:  .  omeprazole (PRILOSEC) 20 MG capsule, TAKE 1 CAPSULE EVERY DAY, Disp: 90 capsule, Rfl: 1 .  potassium chloride SA (KLOR-CON) 20 MEQ tablet, Take 20 mEq by mouth daily., Disp: , Rfl:  .  simvastatin (ZOCOR) 20 MG tablet, TAKE 1 TABLET EVERY DAY (Patient taking differently: Take 20 mg by mouth at bedtime.), Disp: 90 tablet, Rfl: 1 .  torsemide (DEMADEX) 20 MG tablet, Take 1 tablet (20 mg total) by mouth daily., Disp: 90 tablet, Rfl: 1 .  warfarin (COUMADIN) 3 MG tablet, Take 3 mg by mouth every day except Tuesdays - take 1.5 mg., Disp: 90 tablet, Rfl: 2   Allergies  Allergen Reactions  . Codeine Nausea And Vomiting  . Doxycycline Other (See Comments)    Chest congestion  . Triamterene-Hctz Other (See Comments)    weakness  . Atorvastatin Other (See Comments)    Myalgias   .  Crestor [Rosuvastatin Calcium] Other (See Comments)    weakness  . Morphine Nausea Only  . Risedronate Sodium Other (See Comments)    ACTONEL - reflux   Past Medical History:  Diagnosis Date  . 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 Anxiety   . Arthritis    "right leg" (04/11/2015)  . BPPV (benign paroxysmal positional vertigo) 03/23/2016  . Cataract   . Coronary atherosclerosis of native coronary artery    a. Nonobstructive minimal CAD 10/2005.  Marland Kitchen Depression   .  Diastolic dysfunction    Grade 1. Ejection fraction 60-65%.  Marland Kitchen Dysrhythmia    a fib  . Erosive esophagitis   . Essential hypertension   . GERD (gastroesophageal reflux disease)   . Grade III hemorrhoids   . History of hiatal hernia   . Hypercholesterolemia   . Internal hemorrhoids with complication 6/76/1950   OCT 2015 FLEX SIG/IH BANDING    . Migraine    "used to have them right bad; I don't now" (04/11/2015)  . MITRAL REGURGITATION 04/27/2010   Qualifier: Diagnosis of  By: [name], MD, [name]   . Osteoporosis   . Paroxysmal atrial fibrillation (HCC)   . PONV (postoperative nausea and vomiting)   . Vertigo     Past Surgical History:  Procedure Laterality Date  . BRAVO [place] STUDY  11/15/2012   Procedure: BRAVO [place];  Surgeon: [name], MD;  [facility];  Service: Endoscopy;;  . CARDIOVERSION N/A 05/16/2020   Procedure: CARDIOVERSION;  Surgeon: Donato Heinz, MD;  Location: [facility] ENDOSCOPY;  Service: Cardiovascular;  Laterality: N/A;  . CATARACT EXTRACTION W/ INTRAOCULAR LENS  IMPLANT, BILATERAL Bilateral   . COLONOSCOPY  2008   Dr. [name]: internal hemorrhoids   . DILATION AND CURETTAGE OF UTERUS    . ESOPHAGOGASTRODUODENOSCOPY (EGD) WITH ESOPHAGEAL DILATION  2001   Dr. [name]: erosive esophagitis, esophageal stricture, duodenitis, s/p Savary dilation  . ESOPHAGOGASTRODUODENOSCOPY (EGD) WITH ESOPHAGEAL DILATION  11/15/2012   DTO:[id number] web was found & MOST LIKELY CAUSE FOR DYAPHAGIA/Polyp was found in the gastric body and gastric fundus/ gastritis on bx  . EYE SURGERY Bilateral    "laser OR after cataract OR; cause I couldn't see"  . FLEXIBLE SIGMOIDOSCOPY N/A 08/22/2014   mild diverticulosis in sigmoid, moderate sized Grade 3 hemorrhoids s/p banding X 3.   Marland Kitchen FRACTURE SURGERY Right    below the knee - 2 bones broke has plates  . HEMORRHOID BANDING N/A 08/22/2014   Procedure: HEMORRHOID BANDING;  Surgeon: [name], MD;  Location: [facility]  ENDO SUITE;  Service: Endoscopy;  Laterality: N/A;  . HEMORRHOID SURGERY N/A 03/30/2018   Procedure: EXTENSIVE HEMORRHOIDECTOMY;  Surgeon: [name], MD;  Location: [facility] ORS;  Service: General;  Laterality: N/A;  . OPEN REDUCTION INTERNAL FIXATION (ORIF) TIBIA/FIBULA FRACTURE Right 2013   broke tibia and fibula after falling down stairs  . PROLAPSED UTERINE FIBROID LIGATION  2015  . TOTAL ABDOMINAL HYSTERECTOMY      Social History   Socioeconomic History  . Marital status: Married    Spouse name: george   . Number of children: 1  . Years of education: Not on file  . Highest education level: Not on file  Occupational History  . Occupation: Retired    Comment: Textile  Tobacco Use  . Smoking status: Never Smoker  . Smokeless tobacco: Never Used  Vaping Use  . Vaping Use: Never used  Substance and Sexual  Activity  . Alcohol use: No    Alcohol/week: 0.0 standard drinks  . Drug use: No  . Sexual activity: Not Currently    Birth control/protection: Surgical, Post-menopausal    Comment: hyst  Other Topics Concern  . Not on file  Social History Narrative   Married   No regular exercise   Social Determinants of Health   Financial Resource Strain: Not on file  Food Insecurity: Not on file  Transportation Needs: Not on file  Physical Activity: Not on file  Stress: Not on file  Social Connections: Not on file  Intimate Partner Violence: Not on file        Objective:    BP 120/68   Pulse 63   Temp (!) 97.1 F (36.2 C) (Temporal)   Ht _0  (1.549 m)   Wt 109 lb (49.4 kg)   SpO2 95%   BMI 20.60 kg/m   Wt Readings from Last 3 Encounters:  03/03/21 109 lb (49.4 kg)  01/22/21 105 lb 3.2 oz (47.7 kg)  12/30/20 101 lb 6.4 oz (46 kg)    Physical Exam Vitals reviewed.  Constitutional:      General: She is not in acute distress.    Appearance: Normal appearance. She is normal weight. She is not ill-appearing, toxic-appearing or diaphoretic.  HENT:     Head:  Normocephalic and atraumatic.  Eyes:     General: No scleral icterus.       Right eye: No discharge.        Left eye: No discharge.     Conjunctiva/sclera: Conjunctivae normal.  Cardiovascular:     Rate and Rhythm: Normal rate.  Pulmonary:     Effort: Pulmonary effort is normal. No respiratory distress.  Musculoskeletal:        General: Normal range of motion.     Cervical back: Normal range of motion.  Skin:    General: Skin is warm and dry.     Capillary Refill: Capillary refill takes less than 2 seconds.     Comments: Seborrheic dermatitis of scalp.  Neurological:     General: No focal deficit present.     Mental Status: She is alert and oriented to person, place, and time. Mental status is at baseline.  Psychiatric:        Mood and Affect: Mood normal.        Behavior: Behavior normal.        Thought Content: Thought content normal.        Judgment: Judgment normal.     Lab Results  Component Value Date   TSH 0.805 05/20/2020   Lab Results  Component Value Date   WBC 7.0 12/09/2020   HGB 13.9 12/09/2020   HCT 41.3 12/09/2020   MCV 95 12/09/2020   PLT 250 12/09/2020   Lab Results  Component Value Date   NA 139 01/22/2021   K 4.4 01/22/2021   CO2 24 01/22/2021   GLUCOSE 89 01/22/2021   BUN 14 01/22/2021   CREATININE 0.77 01/22/2021   BILITOT 0.4 01/22/2021   ALKPHOS 152 (H) 01/22/2021   AST 28 01/22/2021   ALT 21 01/22/2021   PROT 6.6 01/22/2021   ALBUMIN 3.6 01/22/2021   CALCIUM 8.6 (L) 01/22/2021   ANIONGAP 11 09/12/2020   EGFR 77 01/22/2021   Lab Results  Component Value Date   CHOL 164 01/22/2021   Lab Results  Component Value Date   HDL 49 01/22/2021   Lab Results  Component Value Date  Bruno 90 01/22/2021   Lab Results  Component Value Date   TRIG 142 01/22/2021   Lab Results  Component Value Date   CHOLHDL 3.3 01/22/2021   Lab Results  Component Value Date   HGBA1C 5.6 02/28/2017

## 2021-03-12 ENCOUNTER — Encounter: Payer: Self-pay | Admitting: Family Medicine

## 2021-03-12 ENCOUNTER — Other Ambulatory Visit: Payer: Self-pay

## 2021-03-12 ENCOUNTER — Ambulatory Visit (INDEPENDENT_AMBULATORY_CARE_PROVIDER_SITE_OTHER): Payer: Medicare HMO | Admitting: Family Medicine

## 2021-03-12 VITALS — BP 119/61 | HR 65 | Temp 97.3°F | Ht 61.0 in | Wt 110.2 lb

## 2021-03-12 DIAGNOSIS — Z7901 Long term (current) use of anticoagulants: Secondary | ICD-10-CM

## 2021-03-12 DIAGNOSIS — I480 Paroxysmal atrial fibrillation: Secondary | ICD-10-CM

## 2021-03-12 LAB — COAGUCHEK XS/INR WAIVED
INR: 1.4 — ABNORMAL HIGH (ref 0.9–1.1)
Prothrombin Time: 16.8 s

## 2021-03-12 NOTE — Progress Notes (Signed)
Assessment & Plan:  1-2. Paroxysmal atrial fibrillation (HCC)/Chronic anticoagulation - CoaguChek XS/INR Waived Description   INR was 1.4 today (goal is 2.0 to 3.0) Take an extra half tablet today (so a whole tablet), then increase to 3 mg (1 tablet) daily.  Re-check in 2 weeks.     Return in about 2 weeks (around 03/26/2021) for INR.  [name], MSN, APRN, FNP-C [facility]  Subjective:    Patient ID: Norma Barajas, female    DOB: 23-Aug-1941, 80 y.o.   MRN: [id number]  Patient Care Team: Loman Brooklyn, FNP as PCP - General (Family Medicine) Minus Breeding, MD as PCP - Cardiology (Cardiology) [name], MD (Inactive) as Attending Physician (Gastroenterology) Melina Schools, OD as Consulting Physician (Optometry) Sherran Needs, NP as Consulting Physician (Cardiology)   Chief Complaint:  Chief Complaint  Patient presents with  . Coagulation Disorder    HPI: Norma Barajas is a 80 y.o. female presenting on 03/12/2021 for Coagulation Disorder  Anticoagulation: Patient here for anticoagulation monitoring. Indication: atrial fibrillation Bleeding Signs/Symptoms:  None Thromboembolic Signs/Symptoms:  None  Missed Coumadin Doses:  None Medication Changes:  no Dietary Changes:  no Bacterial/Viral Infection:  no   New complaints: None  Social history:  Relevant past medical, surgical, family and social history reviewed and updated as indicated. Interim medical history since our last visit reviewed.  Allergies and medications reviewed and updated.  DATA REVIEWED: CHART IN EPIC  ROS: Negative unless specifically indicated above in HPI.    Current Outpatient Medications:  .  acetaminophen (TYLENOL) 500 MG tablet, Take 1,000 mg by mouth every 6 (six) hours as needed for moderate pain or mild pain., Disp: , Rfl:  .  amLODipine (NORVASC) 10 MG tablet, TAKE 1 TABLET EVERY DAY, Disp: 90 tablet, Rfl: 0 .  cholecalciferol (VITAMIN D3) 25  MCG (1000 UT) tablet, Take 1,000 Units by mouth daily., Disp: , Rfl:  .  ketoconazole (NIZORAL) 2 % shampoo, Apply 1 application topically 2 (two) times a week., Disp: 120 mL, Rfl: 0 .  metoprolol tartrate (LOPRESSOR) 50 MG tablet, Take 50 mg by mouth in the morning and at bedtime., Disp: , Rfl:  .  Multiple Vitamin (MULTIVITAMIN) capsule, Take 1 capsule by mouth daily., Disp: , Rfl:  .  Omega-3 Fatty Acids (FISH OIL) 1000 MG CAPS, Take 1,000 mg by mouth 2 (two) times daily. , Disp: , Rfl:  .  omeprazole (PRILOSEC) 20 MG capsule, TAKE 1 CAPSULE EVERY DAY, Disp: 90 capsule, Rfl: 1 .  potassium chloride SA (KLOR-CON) 20 MEQ tablet, Take 20 mEq by mouth daily., Disp: , Rfl:  .  simvastatin (ZOCOR) 20 MG tablet, TAKE 1 TABLET EVERY DAY (Patient taking differently: Take 20 mg by mouth at bedtime.), Disp: 90 tablet, Rfl: 1 .  torsemide (DEMADEX) 20 MG tablet, Take 1 tablet (20 mg total) by mouth daily., Disp: 90 tablet, Rfl: 1 .  warfarin (COUMADIN) 3 MG tablet, Take 3 mg by mouth every day except Tuesdays - take 1.5 mg., Disp: 90 tablet, Rfl: 2   Allergies  Allergen Reactions  . Codeine Nausea And Vomiting  . Doxycycline Other (See Comments)    Chest congestion  . Triamterene-Hctz Other (See Comments)    weakness  . Atorvastatin Other (See Comments)    Myalgias   . Crestor [Rosuvastatin Calcium] Other (See Comments)    weakness  . Morphine Nausea Only  . Risedronate Sodium Other (See Comments)  ACTONEL - reflux   Past Medical History:  Diagnosis Date  . 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 Anxiety   . Arthritis    "right leg" (04/11/2015)  . BPPV (benign paroxysmal positional vertigo) 03/23/2016  . Cataract   . Coronary atherosclerosis of native coronary artery    a. Nonobstructive minimal CAD 10/2005.  Marland Kitchen Depression   . Diastolic dysfunction    Grade 1. Ejection fraction 60-65%.  Marland Kitchen Dysrhythmia    a fib  . Erosive esophagitis   . Essential hypertension   . GERD  (gastroesophageal reflux disease)   . Grade III hemorrhoids   . History of hiatal hernia   . Hypercholesterolemia   . Internal hemorrhoids with complication 9/75/8832   OCT 2015 FLEX SIG/IH BANDING    . Migraine    "used to have them right bad; I don't now" (04/11/2015)  . MITRAL REGURGITATION 04/27/2010   Qualifier: Diagnosis of  By: [name], MD, [name]   . Osteoporosis   . Paroxysmal atrial fibrillation (HCC)   . PONV (postoperative nausea and vomiting)   . Vertigo     Past Surgical History:  Procedure Laterality Date  . BRAVO [place] STUDY  11/15/2012   Procedure: BRAVO [place];  Surgeon: [name], MD;  [facility];  Service: Endoscopy;;  . CARDIOVERSION N/A 05/16/2020   Procedure: CARDIOVERSION;  Surgeon: Donato Heinz, MD;  Location: [facility] ENDOSCOPY;  Service: Cardiovascular;  Laterality: N/A;  . CATARACT EXTRACTION W/ INTRAOCULAR LENS  IMPLANT, BILATERAL Bilateral   . COLONOSCOPY  2008   Dr. [name]: internal hemorrhoids   . DILATION AND CURETTAGE OF UTERUS    . ESOPHAGOGASTRODUODENOSCOPY (EGD) WITH ESOPHAGEAL DILATION  2001   Dr. [name]: erosive esophagitis, esophageal stricture, duodenitis, s/p Savary dilation  . ESOPHAGOGASTRODUODENOSCOPY (EGD) WITH ESOPHAGEAL DILATION  11/15/2012   PQD:[id number] web was found & MOST LIKELY CAUSE FOR DYAPHAGIA/Polyp was found in the gastric body and gastric fundus/ gastritis on bx  . EYE SURGERY Bilateral    "laser OR after cataract OR; cause I couldn't see"  . FLEXIBLE SIGMOIDOSCOPY N/A 08/22/2014   mild diverticulosis in sigmoid, moderate sized Grade 3 hemorrhoids s/p banding X 3.   Marland Kitchen FRACTURE SURGERY Right    below the knee - 2 bones broke has plates  . HEMORRHOID BANDING N/A 08/22/2014   Procedure: HEMORRHOID BANDING;  Surgeon: [name], MD;  [facility];  Service: Endoscopy;  Laterality: N/A;  . HEMORRHOID SURGERY N/A 03/30/2018   Procedure: EXTENSIVE HEMORRHOIDECTOMY;  Surgeon: [name], MD;  Location: [facility] ORS;  Service: General;  Laterality: N/A;  . OPEN REDUCTION INTERNAL FIXATION (ORIF) TIBIA/FIBULA FRACTURE Right 2013   broke tibia and fibula after falling down stairs  . PROLAPSED UTERINE FIBROID LIGATION  2015  . TOTAL ABDOMINAL HYSTERECTOMY      Social History   Socioeconomic History  . Marital status: Married    Spouse name: george   . Number of children: 1  . Years of education: Not on file  . Highest education level: Not on file  Occupational History  . Occupation: Retired    Comment: Textile  Tobacco Use  . Smoking status: Never Smoker  . Smokeless tobacco: Never Used  Vaping Use  . Vaping Use: Never used  Substance and Sexual Activity  . Alcohol use: No    Alcohol/week: 0.0 standard drinks  . Drug use: No  . Sexual activity: Not Currently  Birth control/protection: Surgical, Post-menopausal    Comment: hyst  Other Topics Concern  . Not on file  Social History Narrative   Married   No regular exercise   Social Determinants of Health   Financial Resource Strain: Not on file  Food Insecurity: Not on file  Transportation Needs: Not on file  Physical Activity: Not on file  Stress: Not on file  Social Connections: Not on file  Intimate Partner Violence: Not on file        Objective:    BP 119/61   Pulse 65   Temp (!) 97.3 F (36.3 C) (Temporal)   Ht $R'5\' 1"'oy$  (1.549 m)   Wt 110 lb 3.2 oz (50 kg)   SpO2 99%   BMI 20.82 kg/m   Wt Readings from Last 3 Encounters:  03/12/21 110 lb 3.2 oz (50 kg)  03/03/21 109 lb (49.4 kg)  01/22/21 105 lb 3.2 oz (47.7 kg)    Physical Exam Vitals reviewed.  Constitutional:      General: She is not in acute distress.    Appearance: Normal appearance. She is normal weight. She is not ill-appearing, toxic-appearing or diaphoretic.  HENT:     Head: Normocephalic and atraumatic.  Eyes:     General: No scleral icterus.       Right eye: No discharge.        Left eye: No discharge.      Conjunctiva/sclera: Conjunctivae normal.  Cardiovascular:     Rate and Rhythm: Normal rate.  Pulmonary:     Effort: Pulmonary effort is normal. No respiratory distress.  Musculoskeletal:        General: Normal range of motion.     Cervical back: Normal range of motion.  Skin:    General: Skin is warm and dry.     Capillary Refill: Capillary refill takes less than 2 seconds.  Neurological:     General: No focal deficit present.     Mental Status: She is alert and oriented to person, place, and time. Mental status is at baseline.  Psychiatric:        Mood and Affect: Mood normal.        Behavior: Behavior normal.        Thought Content: Thought content normal.        Judgment: Judgment normal.     Lab Results  Component Value Date   TSH 0.805 05/20/2020   Lab Results  Component Value Date   WBC 7.0 12/09/2020   HGB 13.9 12/09/2020   HCT 41.3 12/09/2020   MCV 95 12/09/2020   PLT 250 12/09/2020   Lab Results  Component Value Date   NA 139 01/22/2021   K 4.4 01/22/2021   CO2 24 01/22/2021   GLUCOSE 89 01/22/2021   BUN 14 01/22/2021   CREATININE 0.77 01/22/2021   BILITOT 0.4 01/22/2021   ALKPHOS 152 (H) 01/22/2021   AST 28 01/22/2021   ALT 21 01/22/2021   PROT 6.6 01/22/2021   ALBUMIN 3.6 01/22/2021   CALCIUM 8.6 (L) 01/22/2021   ANIONGAP 11 09/12/2020   EGFR 77 01/22/2021   Lab Results  Component Value Date   CHOL 164 01/22/2021   Lab Results  Component Value Date   HDL 49 01/22/2021   Lab Results  Component Value Date   LDLCALC 90 01/22/2021   Lab Results  Component Value Date   TRIG 142 01/22/2021   Lab Results  Component Value Date   CHOLHDL 3.3 01/22/2021   Lab  Results  Component Value Date   HGBA1C 5.6 02/28/2017

## 2021-03-15 DIAGNOSIS — I5033 Acute on chronic diastolic (congestive) heart failure: Secondary | ICD-10-CM | POA: Insufficient documentation

## 2021-03-15 DIAGNOSIS — I5032 Chronic diastolic (congestive) heart failure: Secondary | ICD-10-CM | POA: Insufficient documentation

## 2021-03-15 NOTE — Progress Notes (Signed)
Cardiology Office Note   Date:  03/18/2021   ID:  Norma Barajas, Norma Barajas August 31, 1941, MRN [id number]  PCP:  Loman Brooklyn, FNP  Cardiologist:   Minus Breeding, MD   Chief Complaint  Patient presents with  . Atrial Fibrillation       History of Present Illness: Norma Barajas is a 80 y.o. female who presents for follow up of atrial fibrillation.  She has been seen by Dr. [name] and Dr. Curt Bears.  She did have an essentially normal echo in 2017.  She was treated with Tikosyn for a while but could not afford this.  She has been treated with Sotalol.   She has had recurrent fibrillation and was followed in the Atrial Fib clinic.  She has been treated with amiodarone and DCCV and she had recurrent fib.  She was to have another DCCV but she declined and wanted to pursue rate control with amiodarone.    I saw her in Nov for hypertension.  Since that she was in the hospital looks like December with a fall.  She had a pelvis fracture.  I was able to review these records in Care Everywhere.  She was managed without surgery.  She went to rehab afterward.  She did have to have her Coumadin interrupted because there was a hematoma and anemia requiring transfusion.  She also had volume overload with these and had to have diuresis.  She had tachycardia.  She says that she is doing well since then.  She is no longer needing a walker.  She denies any chest pressure, neck or arm discomfort.  She has not felt any palpitations, presyncope or syncope.  She is in sinus rhythm today.  She denies any PND or orthopnea.  She has had no weight gain or edema.  She had not been eating well.  Which she has been eating well now gained her weight back.  Of note there was an echo in November that demonstrated an EF of 50 to 55%.  She has some RV systolic dysfunction.  There is moderate mitral regurgitation but no evidence of mitral stenosis.  There is severe tricuspid regurgitation.  Right atrial pressure is found to be 15 mm.   Pulmonary artery systolic pressure is 35.  Past Medical History:  Diagnosis Date  . 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 Anxiety   . Arthritis    "right leg" (04/11/2015)  . BPPV (benign paroxysmal positional vertigo) 03/23/2016  . Cataract   . Coronary atherosclerosis of native coronary artery    a. Nonobstructive minimal CAD 10/2005.  Marland Kitchen Depression   . Diastolic dysfunction    Grade 1. Ejection fraction 60-65%.  Marland Kitchen Dysrhythmia    a fib  . Erosive esophagitis   . Essential hypertension   . GERD (gastroesophageal reflux disease)   . Grade III hemorrhoids   . History of hiatal hernia   . Hypercholesterolemia   . Internal hemorrhoids with complication 05/04/349   OCT 2015 FLEX SIG/IH BANDING    . Migraine    "used to have them right bad; I don't now" (04/11/2015)  . MITRAL REGURGITATION 04/27/2010   Qualifier: Diagnosis of  By: [name], MD, [name]   . Osteoporosis   . Paroxysmal atrial fibrillation (HCC)   . PONV (postoperative nausea and vomiting)   . Vertigo     Past Surgical History:  Procedure Laterality Date  . BRAVO [place] STUDY  11/15/2012   Procedure:  BRAVO [place] STUDY;  Surgeon: [name], MD;  Location: [facility];  Service: Endoscopy;;  . CARDIOVERSION N/A 05/16/2020   Procedure: CARDIOVERSION;  Surgeon: Donato Heinz, MD;  Location: [facility] ENDOSCOPY;  Service: Cardiovascular;  Laterality: N/A;  . CATARACT EXTRACTION W/ INTRAOCULAR LENS  IMPLANT, BILATERAL Bilateral   . COLONOSCOPY  2008   Dr. [name]: internal hemorrhoids   . DILATION AND CURETTAGE OF UTERUS    . ESOPHAGOGASTRODUODENOSCOPY (EGD) WITH ESOPHAGEAL DILATION  2001   Dr. [name]: erosive esophagitis, esophageal stricture, duodenitis, s/p Savary dilation  . ESOPHAGOGASTRODUODENOSCOPY (EGD) WITH ESOPHAGEAL DILATION  11/15/2012   [id number] web was found & MOST LIKELY CAUSE FOR DYAPHAGIA/Polyp was found in the gastric body and gastric fundus/ gastritis on bx  . EYE SURGERY  Bilateral    "laser OR after cataract OR; cause I couldn't see"  . FLEXIBLE SIGMOIDOSCOPY N/A 08/22/2014   mild diverticulosis in sigmoid, moderate sized Grade 3 hemorrhoids s/p banding X 3.   Marland Kitchen FRACTURE SURGERY Right    below the knee - 2 bones broke has plates  . HEMORRHOID BANDING N/A 08/22/2014   Procedure: HEMORRHOID BANDING;  Surgeon: [name], MD;  Location: [facility];  Service: Endoscopy;  Laterality: N/A;  . HEMORRHOID SURGERY N/A 03/30/2018   Procedure: EXTENSIVE HEMORRHOIDECTOMY;  Surgeon: [name], MD;  Location: [facility] ORS;  Service: General;  Laterality: N/A;  . OPEN REDUCTION INTERNAL FIXATION (ORIF) TIBIA/FIBULA FRACTURE Right 2013   broke tibia and fibula after falling down stairs  . PROLAPSED UTERINE FIBROID LIGATION  2015  . TOTAL ABDOMINAL HYSTERECTOMY       Current Outpatient Medications  Medication Sig Dispense Refill  . acetaminophen (TYLENOL) 500 MG tablet Take 1,000 mg by mouth every 6 (six) hours as needed for moderate pain or mild pain.    Marland Kitchen amLODipine (NORVASC) 10 MG tablet TAKE 1 TABLET EVERY DAY 90 tablet 0  . cholecalciferol (VITAMIN D3) 25 MCG (1000 UT) tablet Take 1,000 Units by mouth daily.    Marland Kitchen ketoconazole (NIZORAL) 2 % shampoo Apply 1 application topically 2 (two) times a week. 120 mL 0  . metoprolol tartrate (LOPRESSOR) 50 MG tablet Take 50 mg by mouth in the morning and at bedtime.    . Multiple Vitamin (MULTIVITAMIN) capsule Take 1 capsule by mouth daily.    . Omega-3 Fatty Acids (FISH OIL) 1000 MG CAPS Take 1,000 mg by mouth 2 (two) times daily.     Marland Kitchen omeprazole (PRILOSEC) 20 MG capsule TAKE 1 CAPSULE EVERY DAY 90 capsule 1  . potassium chloride (KLOR-CON) 10 MEQ tablet Take 10 mEq by mouth once.    . simvastatin (ZOCOR) 20 MG tablet TAKE 1 TABLET EVERY DAY (Patient taking differently: Take 20 mg by mouth at bedtime.) 90 tablet 1  . torsemide (DEMADEX) 20 MG tablet Take 1 tablet (20 mg total) by mouth daily. 90 tablet 1  . warfarin  (COUMADIN) 3 MG tablet Take 3 mg by mouth every day except Tuesdays - take 1.5 mg. 90 tablet 2   No current facility-administered medications for this visit.    Allergies:   Codeine, Doxycycline, Triamterene-hctz, Atorvastatin, Crestor [rosuvastatin calcium], Morphine, and Risedronate sodium    ROS:  Please see the history of present illness.   Otherwise, review of systems are positive for none.   All other systems are reviewed and negative.    PHYSICAL EXAM: VS:  BP 120/70   Pulse 63   Ht 5\' 1"  (1.549 m)  Wt 110 lb (49.9 kg)   BMI 20.78 kg/m  , BMI Body mass index is 20.78 kg/m. GENERAL:  Well appearing, mild frailty NECK:  No jugular venous distention, waveform within normal limits, carotid upstroke brisk and symmetric, no bruits, no thyromegaly LUNGS:  Clear to auscultation bilaterally CHEST:  Unremarkable HEART:  PMI not displaced or sustained,S1 and S2 within normal limits, no S3, no S4, no clicks, no rubs, 3 out of 6 holosystolic murmur heard at the third left intercostal space murmurs ABD:  Flat, positive bowel sounds normal in frequency in pitch, no bruits, no rebound, no guarding, no midline pulsatile mass, no hepatomegaly, no splenomegaly EXT:  2 plus pulses throughout, trace lower extremity edema, no cyanosis no clubbing    EKG:  EKG is ordered today. Sinus rhythm, rate 63, axis within normal limits, intervals within normal limits, no acute ST-T wave changes.  Premature ventricular contractions   Recent Labs: 05/20/2020: TSH 0.805 08/18/2020: NT-Pro BNP CANCELED 09/10/2020: B Natriuretic Peptide 546.0 09/12/2020: Magnesium 1.7 12/09/2020: Hemoglobin 13.9; Platelets 250 01/22/2021: ALT 21; BUN 14; Creatinine, Ser 0.77; Potassium 4.4; Sodium 139    Lipid Panel    Component Value Date/Time   CHOL 164 01/22/2021 1052   CHOL 192 05/15/2013 0829   TRIG 142 01/22/2021 1052   TRIG 258 (H) 03/19/2014 0812   TRIG 179 (H) 05/15/2013 0829   HDL 49 01/22/2021 1052   HDL 45  03/19/2014 0812   HDL 44 05/15/2013 0829   CHOLHDL 3.3 01/22/2021 1052   CHOLHDL 3.9 02/10/2016 0208   VLDL 33 02/10/2016 0208   LDLCALC 90 01/22/2021 1052   LDLCALC 101 (H) 03/19/2014 0812   LDLCALC 112 (H) 05/15/2013 0829      Wt Readings from Last 3 Encounters:  03/18/21 110 lb (49.9 kg)  03/12/21 110 lb 3.2 oz (50 kg)  03/03/21 109 lb (49.4 kg)      Other studies Reviewed: Additional studies/ records that were reviewed today include: Extensive review of [facility] records Review of the above records demonstrates: See elsewhere   ASSESSMENT AND PLAN:  ATRIAL FIBRILLATION:     Norma Barajas has a CHA2DS2 - VASc score of 4.  She is back in sinus rhythm.   Her hemoglobin in February was 13.9.  She is subtherapeutic on her Coumadin.  We are making sure that she has Coumadin follow-up.  Was in her bag of medicines when we will have to review this.  She is not having any paroxysms.  No change in therapy.  Of note she is not taking digoxin which was prescribed when she left [facility].  I agree with not being on this.  CHRONIC DIASTOLIC HF: She seems to be euvolemic.   Her creatinine and potassium were normal in March.    She is on a lower dose of potassium that she was taking when she left [place] but I think this is reasonable.  No change in therapy.  RV DYSFUNCTION:     This was reviewed as above.  She has severe TR and some elevated pulmonary pressures but she is really not having a lot of symptoms related to this.  She has significant frailty.  I will follow this with an echocardiogram.  I do not think she is having particular symptoms related to this.  I think this can be managed medically.   HTN:   For the second visit in a row we have to send her husband back home to get the  bottles because they want we are okay taking.  The list is as above.  Her blood pressure is controlled.  Her meds are controlled on meds as listed.  Not quite the same list as when she left [place].   We told her she needs to get rid of all the other medications in her house.  Would be happy to review anything that is being sent to her automatically its not in that bag.  The list above is correct.  Current medicines are reviewed at length with the patient today.  The patient does not have concerns regarding medicines.  The following changes have been made: None  Labs/ tests ordered today include:  None   Orders Placed This Encounter  Procedures  . EKG 12-Lead     Disposition:   FU with me in 6 months     Signed, Minus Breeding, MD  03/18/2021 11:09 AM    Norma Barajas

## 2021-03-18 ENCOUNTER — Other Ambulatory Visit: Payer: Self-pay

## 2021-03-18 ENCOUNTER — Encounter: Payer: Self-pay | Admitting: Cardiology

## 2021-03-18 ENCOUNTER — Ambulatory Visit: Payer: Medicare HMO | Admitting: Cardiology

## 2021-03-18 VITALS — BP 120/70 | HR 63 | Ht 61.0 in | Wt 110.0 lb

## 2021-03-18 DIAGNOSIS — I10 Essential (primary) hypertension: Secondary | ICD-10-CM

## 2021-03-18 DIAGNOSIS — I480 Paroxysmal atrial fibrillation: Secondary | ICD-10-CM | POA: Diagnosis not present

## 2021-03-18 DIAGNOSIS — I5032 Chronic diastolic (congestive) heart failure: Secondary | ICD-10-CM

## 2021-03-18 NOTE — Patient Instructions (Signed)
Medication Instructions:  The current medical regimen is effective;  continue present plan and medications.  *If you need a refill on your cardiac medications before your next appointment, please call your pharmacy*  Follow-Up: At [facility], you and your health needs are our priority.  As part of our continuing mission to provide you with exceptional heart care, we have created designated Provider Care Teams.  These Care Teams include your primary Cardiologist (physician) and Advanced Practice Providers (APPs -  Physician Assistants and Nurse Practitioners) who all work together to provide you with the care you need, when you need it.  We recommend signing up for the patient portal called "MyChart".  Sign up information is provided on this After Visit Summary.  MyChart is used to connect with patients for Virtual Visits (Telemedicine).  Patients are able to view lab/test results, encounter notes, upcoming appointments, etc.  Non-urgent messages can be sent to your provider as well.   To learn more about what you can do with MyChart, go to [email or web address].    Your next appointment:   6 month(s)  The format for your next appointment:   In Person  Provider:   James Hochrein, MD   Thank you for choosing [facility]!!     

## 2021-03-23 ENCOUNTER — Other Ambulatory Visit: Payer: Self-pay

## 2021-03-23 ENCOUNTER — Encounter (HOSPITAL_COMMUNITY): Payer: Self-pay | Admitting: Emergency Medicine

## 2021-03-23 ENCOUNTER — Emergency Department (HOSPITAL_COMMUNITY): Payer: Medicare HMO

## 2021-03-23 ENCOUNTER — Inpatient Hospital Stay (HOSPITAL_COMMUNITY)
Admission: EM | Admit: 2021-03-23 | Discharge: 2021-03-26 | DRG: 480 | Disposition: A | Discharge status: Skilled Nursing Facility | Payer: Medicare HMO | Attending: Internal Medicine | Admitting: Internal Medicine

## 2021-03-23 DIAGNOSIS — W1830XA Fall on same level, unspecified, initial encounter: Secondary | ICD-10-CM | POA: Diagnosis present

## 2021-03-23 DIAGNOSIS — E785 Hyperlipidemia, unspecified: Secondary | ICD-10-CM | POA: Diagnosis not present

## 2021-03-23 DIAGNOSIS — I5082 Biventricular heart failure: Secondary | ICD-10-CM | POA: Diagnosis present

## 2021-03-23 DIAGNOSIS — R7401 Elevation of levels of liver transaminase levels: Secondary | ICD-10-CM

## 2021-03-23 DIAGNOSIS — Z800 Family history of malignant neoplasm of digestive organs: Secondary | ICD-10-CM

## 2021-03-23 DIAGNOSIS — W19XXXA Unspecified fall, initial encounter: Secondary | ICD-10-CM

## 2021-03-23 DIAGNOSIS — Z8249 Family history of ischemic heart disease and other diseases of the circulatory system: Secondary | ICD-10-CM

## 2021-03-23 DIAGNOSIS — I517 Cardiomegaly: Secondary | ICD-10-CM | POA: Diagnosis not present

## 2021-03-23 DIAGNOSIS — Z79899 Other long term (current) drug therapy: Secondary | ICD-10-CM

## 2021-03-23 DIAGNOSIS — I110 Hypertensive heart disease with heart failure: Secondary | ICD-10-CM | POA: Diagnosis present

## 2021-03-23 DIAGNOSIS — Y9301 Activity, walking, marching and hiking: Secondary | ICD-10-CM | POA: Diagnosis present

## 2021-03-23 DIAGNOSIS — Z833 Family history of diabetes mellitus: Secondary | ICD-10-CM

## 2021-03-23 DIAGNOSIS — S72001A Fracture of unspecified part of neck of right femur, initial encounter for closed fracture: Secondary | ICD-10-CM | POA: Diagnosis not present

## 2021-03-23 DIAGNOSIS — Z823 Family history of stroke: Secondary | ICD-10-CM

## 2021-03-23 DIAGNOSIS — M96840 Postprocedural hematoma of a musculoskeletal structure following a musculoskeletal system procedure: Secondary | ICD-10-CM | POA: Diagnosis not present

## 2021-03-23 DIAGNOSIS — Z8262 Family history of osteoporosis: Secondary | ICD-10-CM

## 2021-03-23 DIAGNOSIS — I482 Chronic atrial fibrillation, unspecified: Secondary | ICD-10-CM

## 2021-03-23 DIAGNOSIS — R0902 Hypoxemia: Secondary | ICD-10-CM | POA: Diagnosis not present

## 2021-03-23 DIAGNOSIS — Y92019 Unspecified place in single-family (private) house as the place of occurrence of the external cause: Secondary | ICD-10-CM

## 2021-03-23 DIAGNOSIS — R935 Abnormal findings on diagnostic imaging of other abdominal regions, including retroperitoneum: Secondary | ICD-10-CM | POA: Diagnosis present

## 2021-03-23 DIAGNOSIS — Z7901 Long term (current) use of anticoagulants: Secondary | ICD-10-CM

## 2021-03-23 DIAGNOSIS — I340 Nonrheumatic mitral (valve) insufficiency: Secondary | ICD-10-CM | POA: Diagnosis present

## 2021-03-23 DIAGNOSIS — S72143A Displaced intertrochanteric fracture of unspecified femur, initial encounter for closed fracture: Secondary | ICD-10-CM

## 2021-03-23 DIAGNOSIS — Z8049 Family history of malignant neoplasm of other genital organs: Secondary | ICD-10-CM | POA: Diagnosis not present

## 2021-03-23 DIAGNOSIS — Z8261 Family history of arthritis: Secondary | ICD-10-CM

## 2021-03-23 DIAGNOSIS — I5032 Chronic diastolic (congestive) heart failure: Secondary | ICD-10-CM | POA: Diagnosis present

## 2021-03-23 DIAGNOSIS — R188 Other ascites: Secondary | ICD-10-CM | POA: Diagnosis not present

## 2021-03-23 DIAGNOSIS — I2510 Atherosclerotic heart disease of native coronary artery without angina pectoris: Secondary | ICD-10-CM | POA: Diagnosis present

## 2021-03-23 DIAGNOSIS — E7800 Pure hypercholesterolemia, unspecified: Secondary | ICD-10-CM | POA: Diagnosis present

## 2021-03-23 DIAGNOSIS — M810 Age-related osteoporosis without current pathological fracture: Secondary | ICD-10-CM | POA: Diagnosis present

## 2021-03-23 DIAGNOSIS — M25561 Pain in right knee: Secondary | ICD-10-CM | POA: Diagnosis not present

## 2021-03-23 DIAGNOSIS — S32501A Unspecified fracture of right pubis, initial encounter for closed fracture: Secondary | ICD-10-CM | POA: Diagnosis not present

## 2021-03-23 DIAGNOSIS — S72009A Fracture of unspecified part of neck of unspecified femur, initial encounter for closed fracture: Secondary | ICD-10-CM | POA: Diagnosis present

## 2021-03-23 DIAGNOSIS — D1779 Benign lipomatous neoplasm of other sites: Secondary | ICD-10-CM | POA: Diagnosis not present

## 2021-03-23 DIAGNOSIS — R4182 Altered mental status, unspecified: Secondary | ICD-10-CM | POA: Diagnosis not present

## 2021-03-23 DIAGNOSIS — Z66 Do not resuscitate: Secondary | ICD-10-CM | POA: Diagnosis not present

## 2021-03-23 DIAGNOSIS — Z20822 Contact with and (suspected) exposure to covid-19: Secondary | ICD-10-CM | POA: Diagnosis present

## 2021-03-23 DIAGNOSIS — K219 Gastro-esophageal reflux disease without esophagitis: Secondary | ICD-10-CM | POA: Diagnosis present

## 2021-03-23 DIAGNOSIS — K761 Chronic passive congestion of liver: Secondary | ICD-10-CM | POA: Diagnosis present

## 2021-03-23 DIAGNOSIS — Z8719 Personal history of other diseases of the digestive system: Secondary | ICD-10-CM | POA: Diagnosis not present

## 2021-03-23 DIAGNOSIS — R509 Fever, unspecified: Secondary | ICD-10-CM | POA: Diagnosis not present

## 2021-03-23 DIAGNOSIS — I959 Hypotension, unspecified: Secondary | ICD-10-CM | POA: Diagnosis not present

## 2021-03-23 DIAGNOSIS — R945 Abnormal results of liver function studies: Secondary | ICD-10-CM | POA: Diagnosis not present

## 2021-03-23 DIAGNOSIS — R0689 Other abnormalities of breathing: Secondary | ICD-10-CM | POA: Diagnosis not present

## 2021-03-23 DIAGNOSIS — J181 Lobar pneumonia, unspecified organism: Secondary | ICD-10-CM | POA: Diagnosis not present

## 2021-03-23 DIAGNOSIS — S72141A Displaced intertrochanteric fracture of right femur, initial encounter for closed fracture: Secondary | ICD-10-CM | POA: Diagnosis not present

## 2021-03-23 DIAGNOSIS — I10 Essential (primary) hypertension: Secondary | ICD-10-CM | POA: Diagnosis not present

## 2021-03-23 DIAGNOSIS — S72141D Displaced intertrochanteric fracture of right femur, subsequent encounter for closed fracture with routine healing: Secondary | ICD-10-CM | POA: Diagnosis not present

## 2021-03-23 DIAGNOSIS — Z8349 Family history of other endocrine, nutritional and metabolic diseases: Secondary | ICD-10-CM

## 2021-03-23 DIAGNOSIS — I480 Paroxysmal atrial fibrillation: Secondary | ICD-10-CM | POA: Diagnosis not present

## 2021-03-23 DIAGNOSIS — Z90710 Acquired absence of both cervix and uterus: Secondary | ICD-10-CM

## 2021-03-23 DIAGNOSIS — S72141G Displaced intertrochanteric fracture of right femur, subsequent encounter for closed fracture with delayed healing: Secondary | ICD-10-CM | POA: Diagnosis not present

## 2021-03-23 DIAGNOSIS — M6281 Muscle weakness (generalized): Secondary | ICD-10-CM | POA: Diagnosis not present

## 2021-03-23 DIAGNOSIS — D3501 Benign neoplasm of right adrenal gland: Secondary | ICD-10-CM | POA: Diagnosis not present

## 2021-03-23 LAB — CBC WITH DIFFERENTIAL/PLATELET
Abs Immature Granulocytes: 0.04 10*3/uL (ref 0.00–0.07)
Basophils Absolute: 0 10*3/uL (ref 0.0–0.1)
Basophils Relative: 0 %
Eosinophils Absolute: 0 10*3/uL (ref 0.0–0.5)
Eosinophils Relative: 0 %
HCT: 41.5 % (ref 36.0–46.0)
Hemoglobin: 14.5 g/dL (ref 12.0–15.0)
Immature Granulocytes: 0 %
Lymphocytes Relative: 3 %
Lymphs Abs: 0.3 10*3/uL — ABNORMAL LOW (ref 0.7–4.0)
MCH: 32.7 pg (ref 26.0–34.0)
MCHC: 34.9 g/dL (ref 30.0–36.0)
MCV: 93.5 fL (ref 80.0–100.0)
Monocytes Absolute: 0.7 10*3/uL (ref 0.1–1.0)
Monocytes Relative: 6 %
Neutro Abs: 9.6 10*3/uL — ABNORMAL HIGH (ref 1.7–7.7)
Neutrophils Relative %: 91 %
Platelets: 178 10*3/uL (ref 150–400)
RBC: 4.44 MIL/uL (ref 3.87–5.11)
RDW: 13.5 % (ref 11.5–15.5)
WBC: 10.7 10*3/uL — ABNORMAL HIGH (ref 4.0–10.5)
nRBC: 0 % (ref 0.0–0.2)

## 2021-03-23 LAB — URINALYSIS, ROUTINE W REFLEX MICROSCOPIC
Bilirubin Urine: NEGATIVE
Glucose, UA: NEGATIVE mg/dL
Hgb urine dipstick: NEGATIVE
Ketones, ur: NEGATIVE mg/dL
Leukocytes,Ua: NEGATIVE
Nitrite: NEGATIVE
Protein, ur: NEGATIVE mg/dL
Specific Gravity, Urine: 1.008 (ref 1.005–1.030)
pH: 8 (ref 5.0–8.0)

## 2021-03-23 LAB — RESP PANEL BY RT-PCR (FLU A&B, COVID) ARPGX2
Influenza A by PCR: NEGATIVE
Influenza B by PCR: NEGATIVE
SARS Coronavirus 2 by RT PCR: NEGATIVE

## 2021-03-23 LAB — PROTIME-INR
INR: 1.6 — ABNORMAL HIGH (ref 0.8–1.2)
Prothrombin Time: 19.3 seconds — ABNORMAL HIGH (ref 11.4–15.2)

## 2021-03-23 LAB — COMPREHENSIVE METABOLIC PANEL
ALT: 491 U/L — ABNORMAL HIGH (ref 0–44)
AST: 1131 U/L — ABNORMAL HIGH (ref 15–41)
Albumin: 3.8 g/dL (ref 3.5–5.0)
Alkaline Phosphatase: 205 U/L — ABNORMAL HIGH (ref 38–126)
Anion gap: 11 (ref 5–15)
BUN: 21 mg/dL (ref 8–23)
CO2: 27 mmol/L (ref 22–32)
Calcium: 9 mg/dL (ref 8.9–10.3)
Chloride: 96 mmol/L — ABNORMAL LOW (ref 98–111)
Creatinine, Ser: 0.9 mg/dL (ref 0.44–1.00)
GFR, Estimated: >60 mL/min (ref >60)
Glucose, Bld: 130 mg/dL — ABNORMAL HIGH (ref 70–99)
Potassium: 3.4 mmol/L — ABNORMAL LOW (ref 3.5–5.1)
Sodium: 134 mmol/L — ABNORMAL LOW (ref 135–145)
Total Bilirubin: 2.7 mg/dL — ABNORMAL HIGH (ref 0.3–1.2)
Total Protein: 7.5 g/dL (ref 6.5–8.1)

## 2021-03-23 LAB — ACETAMINOPHEN LEVEL: Acetaminophen (Tylenol), Serum: <10 ug/mL — ABNORMAL LOW (ref 10–30)

## 2021-03-23 MED ORDER — LORAZEPAM 2 MG/ML IJ SOLN
0.5000 mg | Freq: Once | INTRAMUSCULAR | Status: AC
  Administered 2021-03-23: 0.5 mg via INTRAVENOUS
  Filled 2021-03-23: qty 1

## 2021-03-23 MED ORDER — FENTANYL CITRATE (PF) 100 MCG/2ML IJ SOLN
50.0000 ug | Freq: Once | INTRAMUSCULAR | Status: AC
  Administered 2021-03-23: 50 ug via INTRAVENOUS
  Filled 2021-03-23: qty 2

## 2021-03-23 MED ORDER — TRANEXAMIC ACID-NACL 1000-0.7 MG/100ML-% IV SOLN
1000.0000 mg | INTRAVENOUS | Status: AC
  Administered 2021-03-24: 1000 mg via INTRAVENOUS
  Filled 2021-03-23: qty 100

## 2021-03-23 MED ORDER — CEFAZOLIN SODIUM-DEXTROSE 2-4 GM/100ML-% IV SOLN
2.0000 g | INTRAVENOUS | Status: AC
  Administered 2021-03-24: 2 g via INTRAVENOUS

## 2021-03-23 MED ORDER — SODIUM CHLORIDE 0.9 % IV BOLUS
500.0000 mL | Freq: Once | INTRAVENOUS | Status: AC
  Administered 2021-03-23: 500 mL via INTRAVENOUS

## 2021-03-23 NOTE — ED Notes (Signed)
Patient not with some difficulty following commands, responds appropriately verbally, but difficulty carrying out tasks physically such as signing her name.

## 2021-03-23 NOTE — ED Notes (Signed)
Pt oxygen dropped to 88% on room air after medication. Pt placed on oxygen 2 L [place]. Tolerating well

## 2021-03-23 NOTE — ED Triage Notes (Signed)
Pt arrived via [facility] d/t fall with c/o of R hip/femur pain. Stated she was going to restroom and suddenly had right chest pain, went to stand up and then fell.

## 2021-03-23 NOTE — Consult Note (Addendum)
ORTHOPAEDIC CONSULTATION  REQUESTING PHYSICIAN: Barton Dubois, MD  ASSESSMENT AND PLAN: 80 y.o. female with the following: Right Hip Intertrochanteric femur fracture  This patient requires inpatient admission to the hospitalist, to include preoperative clearance and perioperative medical management  - Weight Bearing Status/Activity: NWB Right lower extremity  - Additional recommended labs/tests: Preop Labs: CBC, BMP, PT/INR, Chest XR and EKG  -VTE Prophylaxis: Please hold prior to OR; to resume POD#1 at the discretion of the primary team; patient presents on chronic Warfarin therapy, INR evaluated and within acceptable limits.   - Pain control: Recommend PO pain medications PRN; judicious use of narcotics  - Follow-up plan: F/u 10-14 days postop  -Procedures: Plan for OR once patient has been medically optimized  Plan for Right Cephalomedullary nail   Plan for OR around noon 03/24/21 pending medical and anesthesia clearance.  Please keep NPO past midnight.    Risks and benefits of surgery, including, but not limited to infection, bleeding, persistent pain, damage to surrounding structures, need for further surgery, malunion, nonunion and more severe complications associated with anesthesia were discussed.  All questions have been answered and they have elected to proceed with surgery.  Surgical consent to be finalized prior to surgery.      Chief Complaint: Right hip pain  HPI: Norma Barajas is a 80 y.o. female who presented to the ED for evaluation after sustaining a mechanical fall.  She was not feeling well yesterday, complaining of some chest pain.  She got up to use the restroom and fell.  She does not remember falling.  She does not know how she fell or landed.  She has pain in her right hip.  It gets worse with movement.  If she remains still, her pain improves.  No numbness or tingling.  She did not hit her head.  She was not using a walker, but was recently relying on a  walker following pelvic fractures.  She lives at home with her husband.  No numbness or tingling.   Past Medical History:  Diagnosis Date  . 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 Anxiety   . Arthritis    "right leg" (04/11/2015)  . BPPV (benign paroxysmal positional vertigo) 03/23/2016  . Cataract   . Coronary atherosclerosis of native coronary artery    a. Nonobstructive minimal CAD 10/2005.  Marland Kitchen Depression   . Diastolic dysfunction    Grade 1. Ejection fraction 60-65%.  Marland Kitchen Dysrhythmia    a fib  . Erosive esophagitis   . Essential hypertension   . GERD (gastroesophageal reflux disease)   . Grade III hemorrhoids   . History of hiatal hernia   . Hypercholesterolemia   . Internal hemorrhoids with complication [id number]   OCT 2015 FLEX SIG/IH BANDING    . Migraine    "used to have them right bad; I don't now" (04/11/2015)  . MITRAL REGURGITATION 04/27/2010   Qualifier: Diagnosis of  By: [name], MD, [name]   . Osteoporosis   . Paroxysmal atrial fibrillation (HCC)   . PONV (postoperative nausea and vomiting)   . Vertigo    Past Surgical History:  Procedure Laterality Date  . BRAVO [place] STUDY  11/15/2012   Procedure: BRAVO [place];  Surgeon: [name], MD;  Location: [facility];  Service: Endoscopy;;  . CARDIOVERSION N/A 05/16/2020   Procedure: CARDIOVERSION;  Surgeon: Donato Heinz, MD;  Location: [facility];  Service: Cardiovascular;  Laterality: N/A;  .  CATARACT EXTRACTION W/ INTRAOCULAR LENS  IMPLANT, BILATERAL Bilateral   . COLONOSCOPY  2008   Dr. [name]: internal hemorrhoids   . DILATION AND CURETTAGE OF UTERUS    . ESOPHAGOGASTRODUODENOSCOPY (EGD) WITH ESOPHAGEAL DILATION  2001   Dr. [name]: erosive esophagitis, esophageal stricture, duodenitis, s/p Savary dilation  . ESOPHAGOGASTRODUODENOSCOPY (EGD) WITH ESOPHAGEAL DILATION  11/15/2012   [id number] web was found & MOST LIKELY CAUSE FOR DYAPHAGIA/Polyp was found in the gastric body and  gastric fundus/ gastritis on bx  . EYE SURGERY Bilateral    "laser OR after cataract OR; cause I couldn't see"  . FLEXIBLE SIGMOIDOSCOPY N/A 08/22/2014   mild diverticulosis in sigmoid, moderate sized Grade 3 hemorrhoids s/p banding X 3.   Marland Kitchen FRACTURE SURGERY Right    below the knee - 2 bones broke has plates  . HEMORRHOID BANDING N/A 08/22/2014   Procedure: HEMORRHOID BANDING;  Surgeon: [name], MD;  Location: [facility];  Service: Endoscopy;  Laterality: N/A;  . HEMORRHOID SURGERY N/A 03/30/2018   Procedure: EXTENSIVE HEMORRHOIDECTOMY;  Surgeon: [name], MD;  Location: [facility];  Service: General;  Laterality: N/A;  . OPEN REDUCTION INTERNAL FIXATION (ORIF) TIBIA/FIBULA FRACTURE Right 2013   broke tibia and fibula after falling down stairs  . PROLAPSED UTERINE FIBROID LIGATION  2015  . TOTAL ABDOMINAL HYSTERECTOMY     Social History   Socioeconomic History  . Marital status: Married    Spouse name: george   . Number of children: 1  . Years of education: Not on file  . Highest education level: Not on file  Occupational History  . Occupation: Retired    Comment: Textile  Tobacco Use  . Smoking status: Never Smoker  . Smokeless tobacco: Never Used  Vaping Use  . Vaping Use: Never used  Substance and Sexual Activity  . Alcohol use: No    Alcohol/week: 0.0 standard drinks  . Drug use: No  . Sexual activity: Not Currently    Birth control/protection: Surgical, Post-menopausal    Comment: hyst  Other Topics Concern  . Not on file  Social History Narrative   Married   No regular exercise   Social Determinants of Health   Financial Resource Strain: Not on file  Food Insecurity: Not on file  Transportation Needs: Not on file  Physical Activity: Not on file  Stress: Not on file  Social Connections: Not on file   Family History  Problem Relation Age of Onset  . Colon cancer Mother 31  . Heart disease Mother   . Osteoporosis Mother   . Hip fracture  Mother   . Stroke Sister   . Diabetes Sister   . Osteoporosis Sister   . Arthritis Sister   . Uterine cancer Sister   . Stroke Sister   . Heart disease Father   . Hyperlipidemia Brother   . Hypertension Brother   . Heart disease Brother   . Stroke Brother   . Heart disease Sister   . Dementia Sister   . Diabetes Son   . Stroke Son   . Heart attack Neg Hx    Allergies  Allergen Reactions  . Codeine Nausea And Vomiting  . Doxycycline Other (See Comments)    Chest congestion  . Triamterene-Hctz Other (See Comments)    weakness  . Atorvastatin Other (See Comments)    Myalgias   . Crestor [Rosuvastatin Calcium] Other (See Comments)    weakness  . Morphine Nausea Only  . Risedronate Sodium  Other (See Comments)    ACTONEL - reflux   Prior to Admission medications   Medication Sig Start Date End Date Taking? Authorizing Provider  acetaminophen (TYLENOL) 500 MG tablet Take 1,000 mg by mouth every 6 (six) hours as needed for moderate pain or mild pain.    [provider]  amLODipine (NORVASC) 10 MG tablet TAKE 1 TABLET EVERY DAY 02/18/21   [name] F, FNP  cholecalciferol (VITAMIN D3) 25 MCG (1000 UT) tablet Take 1,000 Units by mouth daily.    [provider]  ketoconazole (NIZORAL) 2 % shampoo Apply 1 application topically 2 (two) times a week. 03/05/21   Loman Brooklyn, FNP  metoprolol tartrate (LOPRESSOR) 50 MG tablet Take 50 mg by mouth in the morning and at bedtime. 09/26/20   [provider]  Multiple Vitamin (MULTIVITAMIN) capsule Take 1 capsule by mouth daily.    [provider]  Omega-3 Fatty Acids (FISH OIL) 1000 MG CAPS Take 1,000 mg by mouth 2 (two) times daily.     [provider]  omeprazole (PRILOSEC) 20 MG capsule TAKE 1 CAPSULE EVERY DAY 02/16/21   Loman Brooklyn, FNP  potassium chloride (KLOR-CON) 10 MEQ tablet Take 10 mEq by mouth once. 02/13/21   [provider]  simvastatin (ZOCOR) 20 MG tablet TAKE 1  TABLET EVERY DAY Patient taking differently: Take 20 mg by mouth at bedtime. 10/24/20   Sherran Needs, NP  torsemide (DEMADEX) 20 MG tablet Take 1 tablet (20 mg total) by mouth daily. 01/22/21   Loman Brooklyn, FNP  warfarin (COUMADIN) 3 MG tablet Take 3 mg by mouth every day except Tuesdays - take 1.5 mg. 12/26/20   Loman Brooklyn, FNP   CT Abdomen Pelvis Wo Contrast  Result Date: 03/23/2021 CLINICAL DATA:  Abdominal pain and fever. EXAM: CT ABDOMEN AND PELVIS WITHOUT CONTRAST TECHNIQUE: Multidetector CT imaging of the abdomen and pelvis was performed following the standard protocol without IV contrast. COMPARISON:  09/07/2020 FINDINGS: Lower chest: Small subpleural consolidation and atelectasis noted within both lung bases. Linear areas of scarring and bronchiectasis noted bilaterally. Hepatobiliary: There are 2 simple appearing cysts noted within the right hepatic lobe. Small amount of sludge noted layering within the gallbladder. No gallbladder wall thickening or pericholecystic fluid. No bile duct dilatation. Pancreas: Unremarkable. No pancreatic ductal dilatation or surrounding inflammatory changes. 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 This measures 6 mm, image 23/2. No suspicious pancreatic mass, inflammation or main duct dilatation. Adrenals/Urinary Tract: Adrenal glands are unremarkable. Kidneys are normal, without renal calculi, focal lesion, or hydronephrosis. Bladder is unremarkable. Stomach/Bowel: Unchanged right adrenal gland adenoma measuring 1.6 cm. Normal left adrenal gland. No kidney stones identified bilaterally. No hydronephrosis. No ureteral calculi. Urinary bladder appears unremarkable. Vascular/Lymphatic: Stomach appears normal. The appendix is not confidently identified separate from the right lower quadrant bowel loops. There is no bowel wall thickening, inflammation, or distension. Reproductive: Previous hysterectomy.  No adnexal mass. Other:  There is a thick wall, fluid attenuating mass along the right pelvic sidewall which measures 4.8 x 3.0 cm, image 61/2. This is a new finding when compared with previous exam. Musculoskeletal: Remote healed bilateral pubic rami fractures are identified. Acute and comminuted intertrochanteric fracture involving the proximal right femur is again noted. IMPRESSION: 1. There is a thick wall, fluid attenuating mass along the right pelvic sidewall which is a new finding when compared with previous exam. Primary differential considerations include infected hematoma versus abscess. 2.  Acute and comminuted intertrochanteric fracture involving the proximal right femur is again noted. 3. Unchanged right adrenal gland adenoma. 4. Small subpleural consolidation and atelectasis noted within both lung bases. Electronically Signed   By: Kerby Moors M.D.   On: 03/23/2021 21:07   DG Chest 1 View  Result Date: 03/23/2021 CLINICAL DATA:  Fall with hip pain EXAM: CHEST  1 VIEW COMPARISON:  10/25/2020 FINDINGS: Linear scarring or atelectasis at the left base. No focal consolidation or effusion. Mild cardiomegaly with aortic atherosclerosis. No pneumothorax. IMPRESSION: No active disease.  Atelectasis or scar at the left base. Electronically Signed   By: Donavan Foil M.D.   On: 03/23/2021 17:06   DG Knee Complete 4 Views Right  Result Date: 03/23/2021 CLINICAL DATA:  Fall with hip pain EXAM: RIGHT KNEE - COMPLETE 4+ VIEW COMPARISON:  12/11/2018 FINDINGS: No acute fracture or malalignment. Status post surgical plate and multiple screw fixation of the proximal tibia. Old proximal fibular fracture. No significant knee effusion IMPRESSION: No acute osseous abnormality. Electronically Signed   By: Donavan Foil M.D.   On: 03/23/2021 17:35   DG Hip Unilat With Pelvis 2-3 Views Right  Result Date: 03/23/2021 CLINICAL DATA:  Fall with hip fracture EXAM: DG HIP (WITH OR WITHOUT PELVIS) 2-3V RIGHT COMPARISON:  CT 09/07/2020 FINDINGS:  SI joints are non widened. Pubic symphysis is intact. Chronic bilateral pubic rami fractures. Acute comminuted and displaced right intertrochanteric fracture. No femoral head dislocation IMPRESSION: 1. Acute comminuted right intertrochanteric fracture. 2. Chronic pubic rami fractures Electronically Signed   By: Donavan Foil M.D.   On: 03/23/2021 17:08   Family History Reviewed and non-contributory, no pertinent history of problems with bleeding or anesthesia    Review of Systems No fevers or chills No numbness or tingling + chest pain No shortness of breath No bowel or bladder dysfunction No GI distress No headaches    OBJECTIVE  Vitals: Patient Vitals for the past 8 hrs:  BP Temp Temp src Pulse Resp SpO2  03/24/21 0428 133/67 99.3 F (37.4 C) Oral 76 18 97 %   General: Alert, no acute distress Cardiovascular: Extremities are warm Respiratory: No cyanosis, no use of accessory musculature Skin: No lesions in the area of chief complaint  Neurologic: Sensation intact distally  Psychiatric: Patient is competent for consent with normal mood and affect Lymphatic: No swelling obvious and reported other than the area involved in the exam below Extremities  RLE: Extremity held in a fixed position.  ROM deferred due to known fracture.  Sensation is intact distally in the sural, saphenous, DP, SP, and plantar nerve distribution. 2+ DP pulse.  Toes are WWP.  Active motion intact in the TA/EHL/GS. LLE: Sensation is intact distally in the sural, saphenous, DP, SP, and plantar nerve distribution. 2+ DP pulse.  Toes are WWP.  Active motion intact in the TA/EHL/GS. Tolerates gentle ROM of the hip.  No pain with axial loading.     Test Results Imaging XR of the Right hip demonstrates a comminuted Intertrochanteric femur fracture.  XR of the right knee demonstrates previous ORIF of tibial plateau fracture, in good position.  No hardware failure or loosening.  Loss of joint space medial and  lateral compartment without large osteophytes.   Labs cbc Recent Labs    03/23/21 1629 03/24/21 0657  WBC 10.7* 12.6*  HGB 14.5 12.0  HCT 41.5 36.2  PLT 178 130*    Labs coag INR 1.7  Recent Labs    03/23/21  1629 03/24/21 0657  NA 134* 139  K 3.4* 3.2*  CL 96* 98  CO2 27 30  GLUCOSE 130* 127*  BUN 21 19  CREATININE 0.90 0.79  CALCIUM 9.0 8.1*

## 2021-03-23 NOTE — ED Provider Notes (Signed)
[facility] EMERGENCY DEPARTMENT Provider Note   CSN: [id number] Arrival date & time: 03/23/21  1453     History Chief Complaint  Patient presents with  . Fall    Norma Barajas is a 80 y.o. female.  HPI She was walking at her home when she suddenly fell and injured her right upper leg.  She is not sure why she fell.  She presents for evaluation by EMS.  Level 5 caveat-altered mental status    Past Medical History:  Diagnosis Date  . 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 Anxiety   . Arthritis    "right leg" (04/11/2015)  . BPPV (benign paroxysmal positional vertigo) 03/23/2016  . Cataract   . Coronary atherosclerosis of native coronary artery    a. Nonobstructive minimal CAD 10/2005.  Marland Kitchen Depression   . Diastolic dysfunction    Grade 1. Ejection fraction 60-65%.  Marland Kitchen Dysrhythmia    a fib  . Erosive esophagitis   . Essential hypertension   . GERD (gastroesophageal reflux disease)   . Grade III hemorrhoids   . History of hiatal hernia   . Hypercholesterolemia   . Internal hemorrhoids with complication [id number]   OCT 2015 FLEX SIG/IH BANDING    . Migraine    "used to have them right bad; I don't now" (04/11/2015)  . MITRAL REGURGITATION 04/27/2010   Qualifier: Diagnosis of  By: [name], MD, [name]   . Osteoporosis   . Paroxysmal atrial fibrillation (HCC)   . PONV (postoperative nausea and vomiting)   . Vertigo     Patient Active Problem List   Diagnosis Date Noted  . Chronic diastolic HF (heart failure) ([place]) 03/15/2021  . Major depressive disorder, recurrent, moderate ([place]) 12/09/2020  . Fracture of ramus of right pubis with routine healing 12/09/2020  . Unintended weight loss 12/09/2020  . 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 ([place]) 10/23/2020  . Esophageal dysphagia   . RUQ abdominal pain   . Biliary colic   . Cholecystitis 09/07/2020  . Early satiety 07/15/2020  . Anorexia 07/15/2020  . Abdominal  pain 07/15/2020  . Chronic anticoagulation 07/17/2019  . Difficulty sleeping 06/22/2019  . Paroxysmal atrial fibrillation ([place]) 08/21/2015  . Vitamin D deficiency 11/27/2014  . Coronary atherosclerosis of native coronary artery 07/24/2013  . Pulmonary nodule 02/18/2011  . Hyperlipidemia 07/29/2008  . GAD (generalized anxiety disorder) 07/29/2008  . Essential hypertension 07/29/2008  . GERD 07/29/2008  . Osteoporosis 07/29/2008    Past Surgical History:  Procedure Laterality Date  . BRAVO [place] STUDY  11/15/2012   Procedure: BRAVO [place];  Surgeon: [name], MD;  Location: [facility] ENDO SUITE;  Service: Endoscopy;;  . CARDIOVERSION N/A 05/16/2020   Procedure: CARDIOVERSION;  Surgeon: Donato Heinz, MD;  Location: [facility] ENDOSCOPY;  Service: Cardiovascular;  Laterality: N/A;  . CATARACT EXTRACTION W/ INTRAOCULAR LENS  IMPLANT, BILATERAL Bilateral   . COLONOSCOPY  2008   Dr. [name]: internal hemorrhoids   . DILATION AND CURETTAGE OF UTERUS    . ESOPHAGOGASTRODUODENOSCOPY (EGD) WITH ESOPHAGEAL DILATION  2001   Dr. [name]: erosive esophagitis, esophageal stricture, duodenitis, s/p Savary dilation  . ESOPHAGOGASTRODUODENOSCOPY (EGD) WITH ESOPHAGEAL DILATION  11/15/2012   [id number] web was found & MOST LIKELY CAUSE FOR DYAPHAGIA/Polyp was found in the gastric body and gastric fundus/ gastritis on bx  . EYE SURGERY Bilateral    "laser OR after cataract OR; cause I couldn't see"  . FLEXIBLE SIGMOIDOSCOPY  N/A 08/22/2014   mild diverticulosis in sigmoid, moderate sized Grade 3 hemorrhoids s/p banding X 3.   Marland Kitchen FRACTURE SURGERY Right    below the knee - 2 bones broke has plates  . HEMORRHOID BANDING N/A 08/22/2014   Procedure: HEMORRHOID BANDING;  Surgeon: [name], MD;  Location: [facility] ENDO SUITE;  Service: Endoscopy;  Laterality: N/A;  . HEMORRHOID SURGERY N/A 03/30/2018   Procedure: EXTENSIVE HEMORRHOIDECTOMY;  Surgeon: [name], MD;  Location: [facility] ORS;  Service: General;   Laterality: N/A;  . OPEN REDUCTION INTERNAL FIXATION (ORIF) TIBIA/FIBULA FRACTURE Right 2013   broke tibia and fibula after falling down stairs  . PROLAPSED UTERINE FIBROID LIGATION  2015  . TOTAL ABDOMINAL HYSTERECTOMY       OB History    Gravida  1   Para  1   Term  1   Preterm      AB      Living  1     SAB      IAB      Ectopic      Multiple      Live Births              Family History  Problem Relation Age of Onset  . Colon cancer Mother 68  . Heart disease Mother   . Osteoporosis Mother   . Hip fracture Mother   . Stroke Sister   . Diabetes Sister   . Osteoporosis Sister   . Arthritis Sister   . Uterine cancer Sister   . Stroke Sister   . Heart disease Father   . Hyperlipidemia Brother   . Hypertension Brother   . Heart disease Brother   . Stroke Brother   . Heart disease Sister   . Dementia Sister   . Diabetes Son   . Stroke Son   . Heart attack Neg Hx     Social History   Tobacco Use  . Smoking status: Never Smoker  . Smokeless tobacco: Never Used  Vaping Use  . Vaping Use: Never used  Substance Use Topics  . Alcohol use: No    Alcohol/week: 0.0 standard drinks  . Drug use: No    Home Medications Prior to Admission medications   Medication Sig Start Date End Date Taking? Authorizing Provider  acetaminophen (TYLENOL) 500 MG tablet Take 1,000 mg by mouth every 6 (six) hours as needed for moderate pain or mild pain.    [provider]  amLODipine (NORVASC) 10 MG tablet TAKE 1 TABLET EVERY DAY 02/18/21   [name] F, FNP  cholecalciferol (VITAMIN D3) 25 MCG (1000 UT) tablet Take 1,000 Units by mouth daily.    [provider]  ketoconazole (NIZORAL) 2 % shampoo Apply 1 application topically 2 (two) times a week. 03/05/21   Loman Brooklyn, FNP  metoprolol tartrate (LOPRESSOR) 50 MG tablet Take 50 mg by mouth in the morning and at bedtime. 09/26/20   [provider]  Multiple Vitamin (MULTIVITAMIN)  capsule Take 1 capsule by mouth daily.    [provider]  Omega-3 Fatty Acids (FISH OIL) 1000 MG CAPS Take 1,000 mg by mouth 2 (two) times daily.     [provider]  omeprazole (PRILOSEC) 20 MG capsule TAKE 1 CAPSULE EVERY DAY 02/16/21   Loman Brooklyn, FNP  potassium chloride (KLOR-CON) 10 MEQ tablet Take 10 mEq by mouth once. 02/13/21   [provider]  simvastatin (ZOCOR) 20 MG tablet TAKE 1 TABLET  EVERY DAY Patient taking differently: Take 20 mg by mouth at bedtime. 10/24/20   Sherran Needs, NP  torsemide (DEMADEX) 20 MG tablet Take 1 tablet (20 mg total) by mouth daily. 01/22/21   Loman Brooklyn, FNP  warfarin (COUMADIN) 3 MG tablet Take 3 mg by mouth every day except Tuesdays - take 1.5 mg. 12/26/20   Loman Brooklyn, FNP    Allergies    Codeine, Doxycycline, Triamterene-hctz, Atorvastatin, Crestor [rosuvastatin calcium], Morphine, and Risedronate sodium  Review of Systems   Review of Systems  Unable to perform ROS: Mental status change    Physical Exam Updated Vital Signs BP (!) 125/58   Pulse 85   Temp (!) 100.7 F (38.2 C) (Oral)   Resp (!) 21   Ht 5\' 1"  (1.549 m)   Wt 48.5 kg   SpO2 99%   BMI 20.22 kg/m   Physical Exam Vitals and nursing note reviewed.  Constitutional:      General: She is in acute distress (Uncomfortable secondary to pain).     Appearance: She is well-developed.     Comments: Elderly, frail  HENT:     Head: Normocephalic and atraumatic.     Right Ear: External ear normal.     Left Ear: External ear normal.  Eyes:     Conjunctiva/sclera: Conjunctivae normal.     Pupils: Pupils are equal, round, and reactive to light.  Neck:     Trachea: Phonation normal.  Cardiovascular:     Rate and Rhythm: Normal rate and regular rhythm.     Heart sounds: Normal heart sounds.  Pulmonary:     Effort: Pulmonary effort is normal.     Breath sounds: Normal breath sounds.  Abdominal:     Palpations: Abdomen is soft.      Tenderness: There is no abdominal tenderness.  Musculoskeletal:     Cervical back: Normal range of motion and neck supple.     Comments: She guards against moving the right upper leg, at the hip and knee secondary to pain.  No gross deformity.  No leg length discrepancy.  Skin:    General: Skin is warm and dry.  Neurological:     Mental Status: She is alert.     Cranial Nerves: No cranial nerve deficit.     Sensory: No sensory deficit.     Motor: No abnormal muscle tone.     Coordination: Coordination normal.  Psychiatric:        Mood and Affect: Mood normal.        Behavior: Behavior normal.     ED Results / Procedures / Treatments   Labs (all labs ordered are listed, but only abnormal results are displayed) Labs Reviewed  COMPREHENSIVE METABOLIC PANEL - Abnormal; Notable for the following components:      Result Value   Sodium 134 (*)    Potassium 3.4 (*)    Chloride 96 (*)    Glucose, Bld 130 (*)    AST 1,131 (*)    ALT 491 (*)    Alkaline Phosphatase 205 (*)    Total Bilirubin 2.7 (*)    All other components within normal limits  CBC WITH DIFFERENTIAL/PLATELET - Abnormal; Notable for the following components:   WBC 10.7 (*)    Neutro Abs 9.6 (*)    Lymphs Abs 0.3 (*)    All other components within normal limits  RESP PANEL BY RT-PCR (FLU A&B, COVID) ARPGX2  URINALYSIS, ROUTINE W REFLEX MICROSCOPIC  ACETAMINOPHEN  LEVEL    EKG None    Date: 03/23/2021  Rate: 94  Rhythm: normal sinus rhythm  QRS Axis: normal  PR and QT Intervals: normal  ST/T Wave abnormalities: nonspecific T wave changes  PR and QRS Conduction Disutrbances:none     Radiology DG Chest 1 View  Result Date: 03/23/2021 CLINICAL DATA:  Fall with hip pain EXAM: CHEST  1 VIEW COMPARISON:  10/25/2020 FINDINGS: Linear scarring or atelectasis at the left base. No focal consolidation or effusion. Mild cardiomegaly with aortic atherosclerosis. No pneumothorax. IMPRESSION: No active disease.   Atelectasis or scar at the left base. Electronically Signed   By: Donavan Foil M.D.   On: 03/23/2021 17:06   DG Knee Complete 4 Views Right  Result Date: 03/23/2021 CLINICAL DATA:  Fall with hip pain EXAM: RIGHT KNEE - COMPLETE 4+ VIEW COMPARISON:  12/11/2018 FINDINGS: No acute fracture or malalignment. Status post surgical plate and multiple screw fixation of the proximal tibia. Old proximal fibular fracture. No significant knee effusion IMPRESSION: No acute osseous abnormality. Electronically Signed   By: Donavan Foil M.D.   On: 03/23/2021 17:35   DG Hip Unilat With Pelvis 2-3 Views Right  Result Date: 03/23/2021 CLINICAL DATA:  Fall with hip fracture EXAM: DG HIP (WITH OR WITHOUT PELVIS) 2-3V RIGHT COMPARISON:  CT 09/07/2020 FINDINGS: SI joints are non widened. Pubic symphysis is intact. Chronic bilateral pubic rami fractures. Acute comminuted and displaced right intertrochanteric fracture. No femoral head dislocation IMPRESSION: 1. Acute comminuted right intertrochanteric fracture. 2. Chronic pubic rami fractures Electronically Signed   By: Donavan Foil M.D.   On: 03/23/2021 17:08    Procedures .Critical Care Performed by: Daleen Bo, MD Authorized by: Daleen Bo, MD   Critical care provider statement:    Critical care time (minutes):  45   Critical care start time:  03/23/2021 3:20 PM   Critical care end time:  03/23/2021 8:05 PM   Critical care time was exclusive of:  Separately billable procedures and treating other patients   Critical care was necessary to treat or prevent imminent or life-threatening deterioration of the following conditions:  Trauma   Critical care was time spent personally by me on the following activities:  Blood draw for specimens, development of treatment plan with patient or surrogate, discussions with consultants, evaluation of patient's response to treatment, examination of patient, obtaining history from patient or surrogate, ordering and performing  treatments and interventions, ordering and review of laboratory studies, pulse oximetry, re-evaluation of patient's condition, review of old charts and ordering and review of radiographic studies     Medications Ordered in ED Medications  fentaNYL (SUBLIMAZE) injection 50 mcg (has no administration in time range)  sodium chloride 0.9 % bolus 500 mL (0 mLs Intravenous Stopped 03/23/21 1801)  fentaNYL (SUBLIMAZE) injection 50 mcg (50 mcg Intravenous Given 03/23/21 1634)  LORazepam (ATIVAN) injection 0.5 mg (0.5 mg Intravenous Given 03/23/21 1633)    ED Course  I have reviewed the triage vital signs and the nursing notes.  Pertinent labs & imaging results that were available during my care of the patient were reviewed by me and considered in my medical decision making (see chart for details).    MDM Rules/Calculators/A&P                           Patient Vitals for the past 24 hrs:  BP Temp Temp src Pulse Resp SpO2 Height Weight  03/23/21 1930 (!) 125/58 -- --  85 (!) 21 99 % -- --  03/23/21 1900 127/73 -- -- 87 (!) 22 98 % -- --  03/23/21 1845 -- -- -- 84 (!) 25 99 % -- --  03/23/21 1830 (!) 122/58 -- -- 78 -- 98 % -- --  03/23/21 1800 125/63 -- -- 82 (!) 27 97 % -- --  03/23/21 1730 129/61 -- -- 82 (!) 23 97 % -- --  03/23/21 1700 130/65 -- -- 93 19 99 % -- --  03/23/21 1650 -- -- -- 88 (!) 26 97 % -- --  03/23/21 1630 133/68 -- -- -- (!) 24 -- -- --  03/23/21 1530 130/68 -- -- 98 (!) 28 92 % -- --  03/23/21 1508 -- -- -- -- -- -- 5\' 1"  (1.549 m) 48.5 kg  03/23/21 1507 (!) 147/76 (!) 100.7 F (38.2 C) Oral (!) 104 (!) 21 92 % -- --  03/23/21 1506 -- -- -- -- -- 94 % -- --    7:51 PM Reevaluation with update and discussion. After initial assessment and treatment, an updated evaluation reveals she is still uncomfortable with right upper leg pain.  Findings discussed with patient and all questions were answered. Daleen Bo   Medical Decision Making:  This patient is presenting  for evaluation of fall at home, which does require a range of treatment options, and is a complaint that involves a moderate risk of morbidity and mortality. The differential diagnoses include fracture, contusion, weakness. I decided to review old records, and in summary elderly female, fell at home today, mechanism not clear.  Isolated injury to the right upper leg and knee.  According to the patient's husband, she complained of chest pain earlier today, and wanted to come to the hospital to get a checkup. She has been well otherwise..  I obtained additional historical information from husband by telephone.  Clinical Laboratory Tests Ordered, included CBC, Metabolic panel and urinalysis. Review indicates normal except sodium low, potassium low, chloride low, glucose high, AST high, ALT high, alkaline phosphatase high, total bilirubin high, white count high.  Viral panel negative Radiologic Tests Ordered, included radiographs of pelvis, right hip, right knee, chest.  I independently Visualized: Radiograph images, which show right hip intertrochanteric fracture   Critical Interventions-clinical evaluation, laboratory testing, radiography, medication treatment, observation and reassessment  After These Interventions, the Patient was reevaluated and was found to require hospitalization for treatment of right hip fracture.  Incidental transaminitis with elevated total bilirubin, possibly contributing to her earlier complaint of chest discomfort.  CT abdomen pelvis ordered without contrast, due to resource limitation.  This will need further evaluation.    7:55 PM-requested callback from orthopedic  CRITICAL CARE-yes Performed by: Daleen Bo  Nursing Notes Reviewed/ Care Coordinated Applicable Imaging Reviewed Interpretation of Laboratory Data incorporated into ED treatment   8:40- PM-Consult complete with hospitalist. Patient case explained and discussed.  She agrees to admit patient for  further evaluation and treatment. Call ended at 9:05 PM  Final Clinical Impression(s) / ED Diagnoses Final diagnoses:  Closed fracture of right hip, initial encounter [facility])  Transaminitis    Rx / DC Orders ED Discharge Orders    None       Daleen Bo, MD 03/23/21 2109

## 2021-03-24 ENCOUNTER — Inpatient Hospital Stay (HOSPITAL_COMMUNITY): Payer: Medicare HMO

## 2021-03-24 ENCOUNTER — Encounter (HOSPITAL_COMMUNITY): Payer: Self-pay | Admitting: Family Medicine

## 2021-03-24 ENCOUNTER — Other Ambulatory Visit: Payer: Self-pay

## 2021-03-24 ENCOUNTER — Inpatient Hospital Stay (HOSPITAL_COMMUNITY): Payer: Medicare HMO | Admitting: Certified Registered"

## 2021-03-24 ENCOUNTER — Encounter (HOSPITAL_COMMUNITY)
Admission: EM | Disposition: A | Discharge status: Skilled Nursing Facility | Payer: Self-pay | Source: Home / Self Care | Attending: Internal Medicine

## 2021-03-24 DIAGNOSIS — S72001A Fracture of unspecified part of neck of right femur, initial encounter for closed fracture: Secondary | ICD-10-CM

## 2021-03-24 DIAGNOSIS — I480 Paroxysmal atrial fibrillation: Secondary | ICD-10-CM | POA: Diagnosis not present

## 2021-03-24 DIAGNOSIS — S72141A Displaced intertrochanteric fracture of right femur, initial encounter for closed fracture: Secondary | ICD-10-CM | POA: Diagnosis not present

## 2021-03-24 HISTORY — PX: INTRAMEDULLARY (IM) NAIL INTERTROCHANTERIC: SHX5875

## 2021-03-24 LAB — COMPREHENSIVE METABOLIC PANEL
ALT: 293 U/L — ABNORMAL HIGH (ref 0–44)
AST: 336 U/L — ABNORMAL HIGH (ref 15–41)
Albumin: 3.1 g/dL — ABNORMAL LOW (ref 3.5–5.0)
Alkaline Phosphatase: 158 U/L — ABNORMAL HIGH (ref 38–126)
Anion gap: 11 (ref 5–15)
BUN: 19 mg/dL (ref 8–23)
CO2: 30 mmol/L (ref 22–32)
Calcium: 8.1 mg/dL — ABNORMAL LOW (ref 8.9–10.3)
Chloride: 98 mmol/L (ref 98–111)
Creatinine, Ser: 0.79 mg/dL (ref 0.44–1.00)
GFR, Estimated: >60 mL/min (ref >60)
Glucose, Bld: 127 mg/dL — ABNORMAL HIGH (ref 70–99)
Potassium: 3.2 mmol/L — ABNORMAL LOW (ref 3.5–5.1)
Sodium: 139 mmol/L (ref 135–145)
Total Bilirubin: 3.2 mg/dL — ABNORMAL HIGH (ref 0.3–1.2)
Total Protein: 6.7 g/dL (ref 6.5–8.1)

## 2021-03-24 LAB — HEPATITIS PANEL, ACUTE
HCV Ab: NONREACTIVE
Hep A IgM: NONREACTIVE
Hep B C IgM: NONREACTIVE
Hepatitis B Surface Ag: NONREACTIVE

## 2021-03-24 LAB — CBC
HCT: 36.2 % (ref 36.0–46.0)
Hemoglobin: 12 g/dL (ref 12.0–15.0)
MCH: 31.4 pg (ref 26.0–34.0)
MCHC: 33.1 g/dL (ref 30.0–36.0)
MCV: 94.8 fL (ref 80.0–100.0)
Platelets: 130 10*3/uL — ABNORMAL LOW (ref 150–400)
RBC: 3.82 MIL/uL — ABNORMAL LOW (ref 3.87–5.11)
RDW: 13.6 % (ref 11.5–15.5)
WBC: 12.6 10*3/uL — ABNORMAL HIGH (ref 4.0–10.5)
nRBC: 0 % (ref 0.0–0.2)

## 2021-03-24 LAB — TYPE AND SCREEN
ABO/RH(D): O POS
Antibody Screen: NEGATIVE

## 2021-03-24 LAB — ABO/RH: ABO/RH(D): O POS

## 2021-03-24 LAB — PROCALCITONIN: Procalcitonin: 8.84 ng/mL

## 2021-03-24 LAB — PROTIME-INR
INR: 1.7 — ABNORMAL HIGH (ref 0.8–1.2)
Prothrombin Time: 19.5 seconds — ABNORMAL HIGH (ref 11.4–15.2)

## 2021-03-24 LAB — MRSA PCR SCREENING: MRSA by PCR: NEGATIVE

## 2021-03-24 LAB — MAGNESIUM: Magnesium: 2 mg/dL (ref 1.7–2.4)

## 2021-03-24 SURGERY — FIXATION, FRACTURE, INTERTROCHANTERIC, WITH INTRAMEDULLARY ROD
Anesthesia: General | Site: Hip | Laterality: Right

## 2021-03-24 MED ORDER — CEFAZOLIN SODIUM-DEXTROSE 2-4 GM/100ML-% IV SOLN
2.0000 g | Freq: Three times a day (TID) | INTRAVENOUS | Status: AC
  Administered 2021-03-24 – 2021-03-25 (×3): 2 g via INTRAVENOUS
  Filled 2021-03-24 (×3): qty 100

## 2021-03-24 MED ORDER — FENTANYL CITRATE (PF) 250 MCG/5ML IJ SOLN
INTRAMUSCULAR | Status: AC
  Filled 2021-03-24: qty 5

## 2021-03-24 MED ORDER — EPHEDRINE SULFATE 50 MG/ML IJ SOLN
INTRAMUSCULAR | Status: DC | PRN
  Administered 2021-03-24 (×3): 5 mg via INTRAVENOUS

## 2021-03-24 MED ORDER — ONDANSETRON HCL 4 MG/2ML IJ SOLN: 4.0000 mg | Freq: Four times a day (QID) | INTRAMUSCULAR | Status: DC | PRN

## 2021-03-24 MED ORDER — CEFAZOLIN SODIUM-DEXTROSE 2-4 GM/100ML-% IV SOLN
INTRAVENOUS | Status: AC
  Filled 2021-03-24: qty 100

## 2021-03-24 MED ORDER — FENTANYL CITRATE (PF) 100 MCG/2ML IJ SOLN
25.0000 ug | INTRAMUSCULAR | Status: DC | PRN
  Administered 2021-03-24: 25 ug via INTRAVENOUS
  Filled 2021-03-24: qty 2

## 2021-03-24 MED ORDER — SIMVASTATIN 20 MG PO TABS
20.0000 mg | ORAL_TABLET | Freq: Every day | ORAL | Status: DC
  Administered 2021-03-24: 20 mg via ORAL
  Filled 2021-03-24: qty 1

## 2021-03-24 MED ORDER — LIDOCAINE HCL (CARDIAC) PF 100 MG/5ML IV SOSY
PREFILLED_SYRINGE | INTRAVENOUS | Status: DC | PRN
  Administered 2021-03-24: 40 mg via INTRAVENOUS

## 2021-03-24 MED ORDER — ORAL CARE MOUTH RINSE: 15.0000 mL | Freq: Once | OROMUCOSAL | Status: AC

## 2021-03-24 MED ORDER — BUPIVACAINE HCL (PF) 0.5 % IJ SOLN
INTRAMUSCULAR | Status: AC
  Filled 2021-03-24: qty 30

## 2021-03-24 MED ORDER — MORPHINE SULFATE (PF) 2 MG/ML IV SOLN: 2.0000 mg | INTRAVENOUS | Status: DC | PRN

## 2021-03-24 MED ORDER — ONDANSETRON HCL 4 MG/2ML IJ SOLN
INTRAMUSCULAR | Status: DC | PRN
  Administered 2021-03-24: 4 mg via INTRAVENOUS

## 2021-03-24 MED ORDER — ONDANSETRON HCL 4 MG PO TABS: 4.0000 mg | ORAL_TABLET | Freq: Four times a day (QID) | ORAL | Status: DC | PRN

## 2021-03-24 MED ORDER — PHENYLEPHRINE HCL (PRESSORS) 10 MG/ML IV SOLN
INTRAVENOUS | Status: DC | PRN
  Administered 2021-03-24: 80 ug via INTRAVENOUS
  Administered 2021-03-24 (×2): 120 ug via INTRAVENOUS
  Administered 2021-03-24 (×2): 80 ug via INTRAVENOUS

## 2021-03-24 MED ORDER — SODIUM CHLORIDE 0.9 % IR SOLN
Status: DC | PRN
  Administered 2021-03-24: 1000 mL

## 2021-03-24 MED ORDER — METOPROLOL TARTRATE 50 MG PO TABS
50.0000 mg | ORAL_TABLET | Freq: Two times a day (BID) | ORAL | Status: DC
  Administered 2021-03-24 – 2021-03-26 (×4): 50 mg via ORAL
  Filled 2021-03-24 (×5): qty 1

## 2021-03-24 MED ORDER — CHLORHEXIDINE GLUCONATE 0.12 % MT SOLN: 15.0000 mL | Freq: Once | OROMUCOSAL | Status: AC

## 2021-03-24 MED ORDER — TRANEXAMIC ACID-NACL 1000-0.7 MG/100ML-% IV SOLN
INTRAVENOUS | Status: AC
  Filled 2021-03-24: qty 100

## 2021-03-24 MED ORDER — AMLODIPINE BESYLATE 5 MG PO TABS
10.0000 mg | ORAL_TABLET | Freq: Every day | ORAL | Status: DC
  Administered 2021-03-25 – 2021-03-26 (×2): 10 mg via ORAL
  Filled 2021-03-24 (×3): qty 2

## 2021-03-24 MED ORDER — PROPOFOL 10 MG/ML IV BOLUS
INTRAVENOUS | Status: DC | PRN
  Administered 2021-03-24: 100 mg via INTRAVENOUS
  Administered 2021-03-24: 50 mg via INTRAVENOUS

## 2021-03-24 MED ORDER — PHENYLEPHRINE HCL-NACL 10-0.9 MG/250ML-% IV SOLN
INTRAVENOUS | Status: AC
  Filled 2021-03-24: qty 250

## 2021-03-24 MED ORDER — ONDANSETRON HCL 4 MG/2ML IJ SOLN: 4.0000 mg | Freq: Once | INTRAMUSCULAR | Status: DC | PRN

## 2021-03-24 MED ORDER — OXYCODONE HCL 5 MG PO TABS
5.0000 mg | ORAL_TABLET | ORAL | Status: DC | PRN
  Administered 2021-03-24 – 2021-03-26 (×3): 5 mg via ORAL
  Filled 2021-03-24 (×3): qty 1

## 2021-03-24 MED ORDER — ENSURE ENLIVE PO LIQD
237.0000 mL | Freq: Two times a day (BID) | ORAL | Status: DC
  Administered 2021-03-26: 237 mL via ORAL
  Filled 2021-03-24: qty 237

## 2021-03-24 MED ORDER — ACETAMINOPHEN 325 MG PO TABS: 650.0000 mg | ORAL_TABLET | Freq: Four times a day (QID) | ORAL | Status: DC | PRN

## 2021-03-24 MED ORDER — PHENYLEPHRINE HCL-NACL 10-0.9 MG/250ML-% IV SOLN
INTRAVENOUS | Status: DC | PRN
  Administered 2021-03-24: 40 ug/min via INTRAVENOUS

## 2021-03-24 MED ORDER — BUPIVACAINE HCL (PF) 0.5 % IJ SOLN
INTRAMUSCULAR | Status: DC | PRN
  Administered 2021-03-24: 30 mL

## 2021-03-24 MED ORDER — ACETAMINOPHEN 650 MG RE SUPP: 650.0000 mg | Freq: Four times a day (QID) | RECTAL | Status: DC | PRN

## 2021-03-24 MED ORDER — CHLORHEXIDINE GLUCONATE 0.12 % MT SOLN
OROMUCOSAL | Status: AC
  Administered 2021-03-24: 15 mL via OROMUCOSAL
  Filled 2021-03-24: qty 15

## 2021-03-24 MED ORDER — TORSEMIDE 20 MG PO TABS
20.0000 mg | ORAL_TABLET | Freq: Every day | ORAL | Status: DC
  Administered 2021-03-25 – 2021-03-26 (×2): 20 mg via ORAL
  Filled 2021-03-24 (×3): qty 1

## 2021-03-24 MED ORDER — SODIUM CHLORIDE 0.9 % IV SOLN: INTRAVENOUS | Status: DC

## 2021-03-24 MED ORDER — ADULT MULTIVITAMIN W/MINERALS CH
1.0000 | ORAL_TABLET | Freq: Every day | ORAL | Status: DC
  Administered 2021-03-24 – 2021-03-26 (×3): 1 via ORAL
  Filled 2021-03-24 (×5): qty 1

## 2021-03-24 MED ORDER — CHLORHEXIDINE GLUCONATE CLOTH 2 % EX PADS
6.0000 | MEDICATED_PAD | Freq: Every day | CUTANEOUS | Status: DC
  Administered 2021-03-24 – 2021-03-26 (×2): 6 via TOPICAL

## 2021-03-24 MED ORDER — FENTANYL CITRATE (PF) 250 MCG/5ML IJ SOLN
INTRAMUSCULAR | Status: DC | PRN
  Administered 2021-03-24 (×5): 25 ug via INTRAVENOUS

## 2021-03-24 MED ORDER — DEXAMETHASONE SODIUM PHOSPHATE 10 MG/ML IJ SOLN
INTRAMUSCULAR | Status: DC | PRN
  Administered 2021-03-24: 5 mg via INTRAVENOUS

## 2021-03-24 MED ORDER — LACTATED RINGERS IV SOLN: INTRAVENOUS | Status: DC

## 2021-03-24 SURGICAL SUPPLY — 55 items
APL PRP STRL LF DISP 70% ISPRP (MISCELLANEOUS) ×1
BIT DRILL 4.0X280 (BIT) ×2 IMPLANT
BLADE SURG SZ10 CARB STEEL (BLADE) ×4 IMPLANT
BNDG GAUZE ELAST 4 BULKY (GAUZE/BANDAGES/DRESSINGS) ×2 IMPLANT
BRUSH SCRUB EZ W/ULTRADEX 3%PC (MISCELLANEOUS) ×2 IMPLANT
CHLORAPREP W/TINT 26 (MISCELLANEOUS) ×2 IMPLANT
CLOTH BEACON ORANGE TIMEOUT ST (SAFETY) ×2 IMPLANT
COVER LIGHT HANDLE STERIS (MISCELLANEOUS) ×4 IMPLANT
COVER MAYO STAND XLG (MISCELLANEOUS) ×2 IMPLANT
COVER PERINEAL POST (MISCELLANEOUS) ×2 IMPLANT
COVER WAND RF STERILE (DRAPES) ×2 IMPLANT
DECANTER SPIKE VIAL GLASS SM (MISCELLANEOUS) ×2 IMPLANT
DRAPE STERI IOBAN 125X83 (DRAPES) ×2 IMPLANT
DRSG PAD ABDOMINAL 8X10 ST (GAUZE/BANDAGES/DRESSINGS) ×2 IMPLANT
DRSG TEGADERM 4X4.75 (GAUZE/BANDAGES/DRESSINGS) ×8 IMPLANT
ELECT REM PT RETURN 9FT ADLT (ELECTROSURGICAL) ×2
ELECTRODE REM PT RTRN 9FT ADLT (ELECTROSURGICAL) ×1 IMPLANT
GAUZE SPONGE 4X4 12PLY STRL (GAUZE/BANDAGES/DRESSINGS) ×2 IMPLANT
GAUZE XEROFORM 1X8 LF (GAUZE/BANDAGES/DRESSINGS) ×4 IMPLANT
GLOVE SKINSENSE NS SZ8.0 LF (GLOVE) ×2
GLOVE SKINSENSE STRL SZ8.0 LF (GLOVE) ×2 IMPLANT
GLOVE SRG 8 PF TXTR STRL LF DI (GLOVE) ×1 IMPLANT
GLOVE SURG UNDER POLY LF SZ7 (GLOVE) ×4 IMPLANT
GLOVE SURG UNDER POLY LF SZ8 (GLOVE) ×2
GOWN STRL REUS W/ TWL XL LVL3 (GOWN DISPOSABLE) ×1 IMPLANT
GOWN STRL REUS W/TWL LRG LVL3 (GOWN DISPOSABLE) ×2 IMPLANT
GOWN STRL REUS W/TWL XL LVL3 (GOWN DISPOSABLE) ×2
GUIDEWIRE BALL NOSE 3.0X900 (WIRE) ×2
GUIDEWIRE ORTH 900X3XBALL NOSE (WIRE) ×1 IMPLANT
INST SET MAJOR BONE (KITS) ×2 IMPLANT
KIT BLADEGUARD II DBL (SET/KITS/TRAYS/PACK) ×2 IMPLANT
KIT TURNOVER CYSTO (KITS) ×2 IMPLANT
MANIFOLD NEPTUNE II (INSTRUMENTS) ×2 IMPLANT
MARKER SKIN DUAL TIP RULER LAB (MISCELLANEOUS) ×2 IMPLANT
NAIL LONG ES 125D 11X36 (Nail) ×2 IMPLANT
NEEDLE HYPO 21X1.5 SAFETY (NEEDLE) ×2 IMPLANT
NS IRRIG 1000ML POUR BTL (IV SOLUTION) ×2 IMPLANT
PACK BASIC III (CUSTOM PROCEDURE TRAY) ×2
PACK SRG BSC III STRL LF ECLPS (CUSTOM PROCEDURE TRAY) ×1 IMPLANT
PAD ARMBOARD 7.5X6 YLW CONV (MISCELLANEOUS) ×2 IMPLANT
PENCIL SMOKE EVACUATOR COATED (MISCELLANEOUS) ×2 IMPLANT
PIN GUIDE THRD AR 3.2X330 (PIN) ×2 IMPLANT
SCREW LOCK CORT 5X34 (Screw) ×2 IMPLANT
SCREW LOCK LAG 10.5X95 GALILEO (Screw) ×2 IMPLANT
SET BASIN LINEN APH (SET/KITS/TRAYS/PACK) ×2 IMPLANT
SPONGE LAP 18X18 RF (DISPOSABLE) ×4 IMPLANT
STAPLER VISISTAT (STAPLE) ×2 IMPLANT
SUT MON AB 2-0 CT1 36 (SUTURE) ×4 IMPLANT
SUT VIC AB 0 CT1 27 (SUTURE) ×2
SUT VIC AB 0 CT1 27XBRD ANTBC (SUTURE) ×1 IMPLANT
SYR 30ML LL (SYRINGE) ×2 IMPLANT
SYR BULB IRRIG 60ML STRL (SYRINGE) ×4 IMPLANT
TOOL ACTIVATION (INSTRUMENTS) ×2 IMPLANT
TRAY FOLEY MTR SLVR 16FR STAT (SET/KITS/TRAYS/PACK) ×2 IMPLANT
YANKAUER SUCT BULB TIP NO VENT (SUCTIONS) ×2 IMPLANT

## 2021-03-24 NOTE — H&P (Signed)
TRH H&P    Patient Demographics:    Norma Barajas, is a 80 y.o. female  MRN: [id number]  DOB - [birth year]  Admit Date - 03/23/2021  Referring MD/NP/PA: Eulis Foster  Outpatient Primary MD for the patient is Loman Brooklyn, FNP  Patient coming from: Home  Chief complaint- Fall   HPI:    Norma Barajas  is a 80 y.o. female, with history of vertigo, paroxysmal atrial fibrillation, osteoporosis, mitral regurgitation, hyper cholesterolemia, GERD, hypertension, diastolic dysfunction, and more presents ED with a chief complaint of fall.  Patient reports that she was getting up off the couch when she fell down.  She does not know why she fell.  She reports that before the fall she did not experience any dizziness, chest pain, palpitations.  She does not think she hit her head, and she knows she did not lose consciousness.  Patient denies any fevers, cough, dysuria, generalized weakness.  She reports she has had normal appetite.  She denies any nausea vomiting or diarrhea.  Patient does not remember exactly how she landed.  She does note that her right hip was immediately pain and she is not able to get up on her own.  Patient has no other complaints at this time.  Patient does not smoke, does not drink, does not use illicit drugs.  She is vaccinated for COVID.  Patient is DNR.  In the ED Temperature 100.7, heart rate 78-1 04, respiratory rate 19-28, blood pressure 126/63, satting at 98% No leukocytosis Chemistry panel is mostly unremarkable Alk phos slightly elevated as expected at 205 Liver function is more elevated than normal with an AST of 1131, ALT of 491, T bili 2.7 Respiratory panel negative UA negative CT abdomen pelvis shows infected hematoma versus abscess, acute and comminuted intertrochanteric fracture involving the proximal right femur Chest x-ray shows no active disease Ortho recommends no DVT prophylaxis, IV fluids,  n.p.o. Admission requested for medical management    Review of systems:    In addition to the HPI above,  No Fever-chills, No Headache, No changes with Vision or hearing, No problems swallowing food or Liquids, No Chest pain, Cough or Shortness of Breath, No Abdominal pain, No Nausea or Vomiting, bowel movements are regular, No Blood in stool or Urine, No dysuria, No new skin rashes or bruises, No new joints pains-aches,  No new weakness, tingling, numbness in any extremity, No recent weight gain or loss, No polyuria, polydypsia or polyphagia, No significant Mental Stressors.  All other systems reviewed and are negative.    Past History of the following :    Past Medical History:  Diagnosis Date  . 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 Anxiety   . Arthritis    "right leg" (04/11/2015)  . BPPV (benign paroxysmal positional vertigo) 03/23/2016  . Cataract   . Coronary atherosclerosis of native coronary artery    a. Nonobstructive minimal CAD 10/2005.  Marland Kitchen Depression   . Diastolic dysfunction    Grade 1. Ejection fraction 60-65%.  Marland Kitchen Dysrhythmia  a fib  . Erosive esophagitis   . Essential hypertension   . GERD (gastroesophageal reflux disease)   . Grade III hemorrhoids   . History of hiatal hernia   . Hypercholesterolemia   . Internal hemorrhoids with complication 03/12/1832   OCT 2015 FLEX SIG/IH BANDING    . Migraine    "used to have them right bad; I don't now" (04/11/2015)  . MITRAL REGURGITATION 04/27/2010   Qualifier: Diagnosis of  By: [name], MD, [name]   . Osteoporosis   . Paroxysmal atrial fibrillation (HCC)   . PONV (postoperative nausea and vomiting)   . Vertigo       Past Surgical History:  Procedure Laterality Date  . BRAVO [place] STUDY  11/15/2012   Procedure: BRAVO [place];  Surgeon: [name], MD;  [facility];  Service: Endoscopy;;  . CARDIOVERSION N/A 05/16/2020   Procedure: CARDIOVERSION;  Surgeon: Donato Heinz, MD;  Location: [facility] ENDOSCOPY;  Service: Cardiovascular;  Laterality: N/A;  . CATARACT EXTRACTION W/ INTRAOCULAR LENS  IMPLANT, BILATERAL Bilateral   . COLONOSCOPY  2008   Dr. [name]: internal hemorrhoids   . DILATION AND CURETTAGE OF UTERUS    . ESOPHAGOGASTRODUODENOSCOPY (EGD) WITH ESOPHAGEAL DILATION  2001   Dr. [name]: erosive esophagitis, esophageal stricture, duodenitis, s/p Savary dilation  . ESOPHAGOGASTRODUODENOSCOPY (EGD) WITH ESOPHAGEAL DILATION  11/15/2012   POI:[id number] web was found & MOST LIKELY CAUSE FOR DYAPHAGIA/Polyp was found in the gastric body and gastric fundus/ gastritis on bx  . EYE SURGERY Bilateral    "laser OR after cataract OR; cause I couldn't see"  . FLEXIBLE SIGMOIDOSCOPY N/A 08/22/2014   mild diverticulosis in sigmoid, moderate sized Grade 3 hemorrhoids s/p banding X 3.   Marland Kitchen FRACTURE SURGERY Right    below the knee - 2 bones broke has plates  . HEMORRHOID BANDING N/A 08/22/2014   Procedure: HEMORRHOID BANDING;  Surgeon: [name], MD;  Location[facility];  Service: Endoscopy;  Laterality: N/A;  . HEMORRHOID SURGERY N/A 03/30/2018   Procedure: EXTENSIVE HEMORRHOIDECTOMY;  Surgeon: [name], MD;  Location: [facility] ORS;  Service: General;  Laterality: N/A;  . OPEN REDUCTION INTERNAL FIXATION (ORIF) TIBIA/FIBULA FRACTURE Right 2013   broke tibia and fibula after falling down stairs  . PROLAPSED UTERINE FIBROID LIGATION  2015  . TOTAL ABDOMINAL HYSTERECTOMY        Social History:      Social History   Tobacco Use  . Smoking status: Never Smoker  . Smokeless tobacco: Never Used  Substance Use Topics  . Alcohol use: No    Alcohol/week: 0.0 standard drinks       Family History :     Family History  Problem Relation Age of Onset  . Colon cancer Mother 47  . Heart disease Mother   . Osteoporosis Mother   . Hip fracture Mother   . Stroke Sister   . Diabetes Sister   . Osteoporosis Sister   . Arthritis Sister   .  Uterine cancer Sister   . Stroke Sister   . Heart disease Father   . Hyperlipidemia Brother   . Hypertension Brother   . Heart disease Brother   . Stroke Brother   . Heart disease Sister   . Dementia Sister   . Diabetes Son   . Stroke Son   . Heart attack Neg Hx       Home Medications:   Prior to Admission medications  Medication Sig Start Date End Date Taking? Authorizing Provider  acetaminophen (TYLENOL) 500 MG tablet Take 1,000 mg by mouth every 6 (six) hours as needed for moderate pain or mild pain.    [provider]  amLODipine (NORVASC) 10 MG tablet TAKE 1 TABLET EVERY DAY 02/18/21   [name] F, FNP  cholecalciferol (VITAMIN D3) 25 MCG (1000 UT) tablet Take 1,000 Units by mouth daily.    [provider]  ketoconazole (NIZORAL) 2 % shampoo Apply 1 application topically 2 (two) times a week. 03/05/21   Loman Brooklyn, FNP  metoprolol tartrate (LOPRESSOR) 50 MG tablet Take 50 mg by mouth in the morning and at bedtime. 09/26/20   [provider]  Multiple Vitamin (MULTIVITAMIN) capsule Take 1 capsule by mouth daily.    [provider]  Omega-3 Fatty Acids (FISH OIL) 1000 MG CAPS Take 1,000 mg by mouth 2 (two) times daily.     [provider]  omeprazole (PRILOSEC) 20 MG capsule TAKE 1 CAPSULE EVERY DAY 02/16/21   Loman Brooklyn, FNP  potassium chloride (KLOR-CON) 10 MEQ tablet Take 10 mEq by mouth once. 02/13/21   [provider]  simvastatin (ZOCOR) 20 MG tablet TAKE 1 TABLET EVERY DAY Patient taking differently: Take 20 mg by mouth at bedtime. 10/24/20   Sherran Needs, NP  torsemide (DEMADEX) 20 MG tablet Take 1 tablet (20 mg total) by mouth daily. 01/22/21   Loman Brooklyn, FNP  warfarin (COUMADIN) 3 MG tablet Take 3 mg by mouth every day except Tuesdays - take 1.5 mg. 12/26/20   Loman Brooklyn, FNP     Allergies:     Allergies  Allergen Reactions  . Codeine Nausea And Vomiting  . Doxycycline Other (See  Comments)    Chest congestion  . Triamterene-Hctz Other (See Comments)    weakness  . Atorvastatin Other (See Comments)    Myalgias   . Crestor [Rosuvastatin Calcium] Other (See Comments)    weakness  . Morphine Nausea Only  . Risedronate Sodium Other (See Comments)    ACTONEL - reflux     Physical Exam:   Vitals  Blood pressure 133/67, pulse 76, temperature 99.3 F (37.4 C), temperature source Oral, resp. rate 18, height _0  (1.549 m), weight 49.8 kg, SpO2 97 %.  1.  General: Patient lying supine in bed in no acute distress  2. Psychiatric: Mood and behavior normal for situation, memory intact, alert and oriented x3  3. Neurologic: Speech and language are normal, face is symmetric, moves all 4 extremities voluntarily, no focal deficits on limited exam  4. HEENMT:  Head is atraumatic, normocephalic, pupils are reactive to light, neck is supple, trachea is midline, mucous membranes are moist  5. Respiratory : Lungs are clear to auscultation bilaterally without wheezes, rhonchi, rales, no cyanosis, no clubbing  6. Cardiovascular : Heart rate is normal, rhythm is regular, heart murmur present, no rubs or gallops, no peripheral edema present  7. Gastrointestinal:  Abdomen is soft, nondistended, nontender to palpation, bowel sounds normal  8. Skin:  Skin is warm dry and intact without acute lesion on limited exam  9.Musculoskeletal:  No Pitting edema present, no calf tenderness, no acute deformity, tenderness to palpation of right hip    Data Review:    CBC Recent Labs  Lab 03/23/21 1629  WBC 10.7*  HGB 14.5  HCT 41.5  PLT 178  MCV 93.5  MCH 32.7  MCHC 34.9  RDW 13.5  LYMPHSABS  0.3*  MONOABS 0.7  EOSABS 0.0  BASOSABS 0.0   ------------------------------------------------------------------------------------------------------------------  Results for orders placed or performed during the hospital encounter of 03/23/21 (from the past 48 hour(s))   Urinalysis, Routine w reflex microscopic Urine, Clean Catch     Status: None   Collection Time: 03/23/21  3:26 PM  Result Value Ref Range   Color, Urine YELLOW YELLOW   APPearance CLEAR CLEAR   Specific Gravity, Urine 1.008 1.005 - 1.030   pH 8.0 5.0 - 8.0   Glucose, UA NEGATIVE NEGATIVE mg/dL   Hgb urine dipstick NEGATIVE NEGATIVE   Bilirubin Urine NEGATIVE NEGATIVE   Ketones, ur NEGATIVE NEGATIVE mg/dL   Protein, ur NEGATIVE NEGATIVE mg/dL   Nitrite NEGATIVE NEGATIVE   Leukocytes,Ua NEGATIVE NEGATIVE    Comment: Performed at [facility], [address] [place], [place] [address]  Resp Panel by RT-PCR (Flu A&B, Covid) Urine, Clean Catch     Status: None   Collection Time: 03/23/21  3:28 PM   Specimen: Urine, Clean Catch; Nasopharyngeal(NP) swabs in vial transport medium  Result Value Ref Range   SARS Coronavirus 2 by RT PCR NEGATIVE NEGATIVE    Comment: (NOTE) SARS-CoV-2 target nucleic acids are NOT DETECTED.  The SARS-CoV-2 RNA is generally detectable in upper respiratory specimens during the acute phase of infection. The lowest concentration of SARS-CoV-2 viral copies this assay can detect is 138 copies/mL. A negative result does not preclude SARS-Cov-2 infection and should not be used as the sole basis for treatment or other patient management decisions. A negative result may occur with  improper specimen collection/handling, submission of specimen other than nasopharyngeal swab, presence of viral mutation(s) within the areas targeted by this assay, and inadequate number of viral copies(<138 copies/mL). A negative result must be combined with clinical observations, patient history, and epidemiological information. The expected result is Negative.  Fact Sheet for Patients:  EntrepreneurPulse.com.au  Fact Sheet for Healthcare Providers:  IncredibleEmployment.be  This test is no t yet approved or cleared by the [place] FDA and   has been authorized for detection and/or diagnosis of SARS-CoV-2 by FDA under an Emergency Use Authorization (EUA). This EUA will remain  in effect (meaning this test can be used) for the duration of the COVID-19 declaration under Section 564(b)(1) of the Act, 21 U.S.C.section 360bbb-3(b)(1), unless the authorization is terminated  or revoked sooner.       Influenza A by PCR NEGATIVE NEGATIVE   Influenza B by PCR NEGATIVE NEGATIVE    Comment: (NOTE) The Xpert Xpress SARS-CoV-2/FLU/RSV plus assay is intended as an aid in the diagnosis of influenza from Nasopharyngeal swab specimens and should not be used as a sole basis for treatment. Nasal washings and aspirates are unacceptable for Xpert Xpress SARS-CoV-2/FLU/RSV testing.  Fact Sheet for Patients: EntrepreneurPulse.com.au  Fact Sheet for Healthcare Providers: IncredibleEmployment.be  This test is not yet approved or cleared by the [place] FDA and has been authorized for detection and/or diagnosis of SARS-CoV-2 by FDA under an Emergency Use Authorization (EUA). This EUA will remain in effect (meaning this test can be used) for the duration of the COVID-19 declaration under Section 564(b)(1) of the Act, 21 U.S.C. section 360bbb-3(b)(1), unless the authorization is terminated or revoked.  Performed at [facility], [address], [place], [place] [address]   Comprehensive metabolic panel     Status: Abnormal   Collection Time: 03/23/21  4:29 PM  Result Value Ref Range   Sodium 134 (L) 135 - 145 mmol/L  Potassium 3.4 (L) 3.5 - 5.1 mmol/L   Chloride 96 (L) 98 - 111 mmol/L   CO2 27 22 - 32 mmol/L   Glucose, Bld 130 (H) 70 - 99 mg/dL    Comment: Glucose reference range applies only to samples taken after fasting for at least 8 hours.   BUN 21 8 - 23 mg/dL   Creatinine, Ser 0.90 0.44 - 1.00 mg/dL   Calcium 9.0 8.9 - 10.3 mg/dL   Total Protein 7.5 6.5 - 8.1 g/dL   Albumin 3.8 3.5 -  5.0 g/dL   AST 1,131 (H) 15 - 41 U/L   ALT 491 (H) 0 - 44 U/L   Alkaline Phosphatase 205 (H) 38 - 126 U/L   Total Bilirubin 2.7 (H) 0.3 - 1.2 mg/dL   GFR, Estimated >60 >60 mL/min    Comment: (NOTE) Calculated using the CKD-EPI Creatinine Equation (2021)    Anion gap 11 5 - 15    Comment: Performed at [facility], [address], [place], [place] [address]  CBC with Differential     Status: Abnormal   Collection Time: 03/23/21  4:29 PM  Result Value Ref Range   WBC 10.7 (H) 4.0 - 10.5 K/uL   RBC 4.44 3.87 - 5.11 MIL/uL   Hemoglobin 14.5 12.0 - 15.0 g/dL   HCT 41.5 36.0 - 46.0 %   MCV 93.5 80.0 - 100.0 fL   MCH 32.7 26.0 - 34.0 pg   MCHC 34.9 30.0 - 36.0 g/dL   RDW 13.5 11.5 - 15.5 %   Platelets 178 150 - 400 K/uL   nRBC 0.0 0.0 - 0.2 %   Neutrophils Relative % 91 %   Neutro Abs 9.6 (H) 1.7 - 7.7 K/uL   Lymphocytes Relative 3 %   Lymphs Abs 0.3 (L) 0.7 - 4.0 K/uL   Monocytes Relative 6 %   Monocytes Absolute 0.7 0.1 - 1.0 K/uL   Eosinophils Relative 0 %   Eosinophils Absolute 0.0 0.0 - 0.5 K/uL   Basophils Relative 0 %   Basophils Absolute 0.0 0.0 - 0.1 K/uL   Immature Granulocytes 0 %   Abs Immature Granulocytes 0.04 0.00 - 0.07 K/uL    Comment: Performed at [facility], [address], [place], [place] [address]  Acetaminophen level     Status: Abnormal   Collection Time: 03/23/21  8:30 PM  Result Value Ref Range   Acetaminophen (Tylenol), Serum <10 (L) 10 - 30 ug/mL    Comment: (NOTE) Therapeutic concentrations vary significantly. A range of 10-30 ug/mL  may be an effective concentration for many patients. However, some  are best treated at concentrations outside of this range. Acetaminophen concentrations >150 ug/mL at 4 hours after ingestion  and >50 ug/mL at 12 hours after ingestion are often associated with  toxic reactions.  Performed at [facility], [address] [place], [place] [address]   Protime-INR     Status: Abnormal   Collection Time: 03/23/21  10:01 PM  Result Value Ref Range   Prothrombin Time 19.3 (H) 11.4 - 15.2 seconds   INR 1.6 (H) 0.8 - 1.2    Comment: (NOTE) INR goal varies based on device and disease states. Performed at [facility] [address] [place], [place] [address]     Chemistries  Recent Labs  Lab 03/23/21 1629  NA 134*  K 3.4*  CL 96*  CO2 27  GLUCOSE 130*  BUN 21  CREATININE 0.90  CALCIUM 9.0  AST 1,131*  ALT 491*  ALKPHOS 205*  BILITOT 2.7*   ------------------------------------------------------------------------------------------------------------------  ------------------------------------------------------------------------------------------------------------------ GFR: Estimated Creatinine Clearance: 36.4 mL/min (by C-G formula based on SCr of 0.9 mg/dL). Liver Function Tests: Recent Labs  Lab 03/23/21 1629  AST 1,131*  ALT 491*  ALKPHOS 205*  BILITOT 2.7*  PROT 7.5  ALBUMIN 3.8   No results for input(s): LIPASE, AMYLASE in the last 168 hours. No results for input(s): AMMONIA in the last 168 hours. Coagulation Profile: Recent Labs  Lab 03/23/21 2201  INR 1.6*   Cardiac Enzymes: No results for input(s): CKTOTAL, CKMB, CKMBINDEX, TROPONINI in the last 168 hours. BNP (last 3 results) Recent Labs    08/18/20 1142  PROBNP CANCELED   HbA1C: No results for input(s): HGBA1C in the last 72 hours. CBG: No results for input(s): GLUCAP in the last 168 hours. Lipid Profile: No results for input(s): CHOL, HDL, LDLCALC, TRIG, CHOLHDL, LDLDIRECT in the last 72 hours. Thyroid Function Tests: No results for input(s): TSH, T4TOTAL, FREET4, T3FREE, THYROIDAB in the last 72 hours. Anemia Panel: No results for input(s): VITAMINB12, FOLATE, FERRITIN, TIBC, IRON, RETICCTPCT in the last 72 hours.  --------------------------------------------------------------------------------------------------------------- Urine analysis:    Component Value Date/Time   COLORURINE YELLOW  03/23/2021 1526   APPEARANCEUR CLEAR 03/23/2021 1526   APPEARANCEUR Clear 07/31/2020 1545   LABSPEC 1.008 03/23/2021 1526   PHURINE 8.0 03/23/2021 1526   GLUCOSEU NEGATIVE 03/23/2021 1526   [place] 03/23/2021 [place] 03/23/2021 1526   BILIRUBINUR Negative 07/31/2020 [place] 03/23/2021 [place] 03/23/2021 1526   UROBILINOGEN negative 02/06/2015 1252   NITRITE NEGATIVE 03/23/2021 [place] 03/23/2021 1526      Imaging Results:    CT Abdomen Pelvis Wo Contrast  Result Date: 03/23/2021 CLINICAL DATA:  Abdominal pain and fever. EXAM: CT ABDOMEN AND PELVIS WITHOUT CONTRAST TECHNIQUE: Multidetector CT imaging of the abdomen and pelvis was performed following the standard protocol without IV contrast. COMPARISON:  09/07/2020 FINDINGS: Lower chest: Small subpleural consolidation and atelectasis noted within both lung bases. Linear areas of scarring and bronchiectasis noted bilaterally. Hepatobiliary: There are 2 simple appearing cysts noted within the right hepatic lobe. Small amount of sludge noted layering within the gallbladder. No gallbladder wall thickening or pericholecystic fluid. No bile duct dilatation. Pancreas: Unremarkable. No pancreatic ductal dilatation or surrounding inflammatory changes. 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 This measures 6 mm, image 23/2. No suspicious pancreatic mass, inflammation or main duct dilatation. Adrenals/Urinary Tract: Adrenal glands are unremarkable. Kidneys are normal, without renal calculi, focal lesion, or hydronephrosis. Bladder is unremarkable. Stomach/Bowel: Unchanged right adrenal gland adenoma measuring 1.6 cm. Normal left adrenal gland. No kidney stones identified bilaterally. No hydronephrosis. No ureteral calculi. Urinary bladder appears unremarkable. Vascular/Lymphatic: Stomach appears normal. The appendix is not confidently identified  separate from the right lower quadrant bowel loops. There is no bowel wall thickening, inflammation, or distension. Reproductive: Previous hysterectomy.  No adnexal mass. Other: There is a thick wall, fluid attenuating mass along the right pelvic sidewall which measures 4.8 x 3.0 cm, image 61/2. This is a new finding when compared with previous exam. Musculoskeletal: Remote healed bilateral pubic rami fractures are identified. Acute and comminuted intertrochanteric fracture involving the proximal right femur is again noted. IMPRESSION: 1. There is a thick wall, fluid attenuating mass along the right pelvic sidewall which is a new finding when compared with previous exam. Primary differential considerations include infected hematoma versus  abscess. 2. Acute and comminuted intertrochanteric fracture involving the proximal right femur is again noted. 3. Unchanged right adrenal gland adenoma. 4. Small subpleural consolidation and atelectasis noted within both lung bases. Electronically Signed   By: Kerby Moors M.D.   On: 03/23/2021 21:07   DG Chest 1 View  Result Date: 03/23/2021 CLINICAL DATA:  Fall with hip pain EXAM: CHEST  1 VIEW COMPARISON:  10/25/2020 FINDINGS: Linear scarring or atelectasis at the left base. No focal consolidation or effusion. Mild cardiomegaly with aortic atherosclerosis. No pneumothorax. IMPRESSION: No active disease.  Atelectasis or scar at the left base. Electronically Signed   By: Donavan Foil M.D.   On: 03/23/2021 17:06   DG Knee Complete 4 Views Right  Result Date: 03/23/2021 CLINICAL DATA:  Fall with hip pain EXAM: RIGHT KNEE - COMPLETE 4+ VIEW COMPARISON:  12/11/2018 FINDINGS: No acute fracture or malalignment. Status post surgical plate and multiple screw fixation of the proximal tibia. Old proximal fibular fracture. No significant knee effusion IMPRESSION: No acute osseous abnormality. Electronically Signed   By: Donavan Foil M.D.   On: 03/23/2021 17:35   DG Hip Unilat  With Pelvis 2-3 Views Right  Result Date: 03/23/2021 CLINICAL DATA:  Fall with hip fracture EXAM: DG HIP (WITH OR WITHOUT PELVIS) 2-3V RIGHT COMPARISON:  CT 09/07/2020 FINDINGS: SI joints are non widened. Pubic symphysis is intact. Chronic bilateral pubic rami fractures. Acute comminuted and displaced right intertrochanteric fracture. No femoral head dislocation IMPRESSION: 1. Acute comminuted right intertrochanteric fracture. 2. Chronic pubic rami fractures Electronically Signed   By: Donavan Foil M.D.   On: 03/23/2021 17:08      Assessment & Plan:    Active Problems:   Hip fracture ([place])   1. Hip fracture 1. : Anticoagulation 2. N.p.o., pain scale 3. Type and screen 4. Plan for surgery in the a.m. 2. Abnormal CT 1. Reads possible infected hematoma 2. Patient did have fever at presentation at 100.7 3. Procalcitonin pending-we will make antibiotic decision after procalcitonin has returned 4. It is possible that this hematoma on the CTA chest from the recent hip fracture 5. Continue to monitor 3. Transaminitis 1. AST and ALT are markedly elevated as well as T bili 2. CT abdomen pelvis did not show acute liver or gallbladder pathology 3. Ultrasound right upper quadrant in the a.m. 4. Hepatitis panel 5. Continue to monitor   DVT Prophylaxis-  SCDs  AM Labs Ordered, also please review Full Orders  Family Communication: No family at bedside code Status: Full  Admission status: Inpatient :The appropriate admission status for this patient is INPATIENT. Inpatient status is judged to be reasonable and necessary in order to provide the required intensity of service to ensure the patient's safety. The patient's presenting symptoms, physical exam findings, and initial radiographic and laboratory data in the context of their chronic comorbidities is felt to place them at high risk for further clinical deterioration. Furthermore, it is not anticipated that the patient will be medically stable  for discharge from the hospital within 2 midnights of admission. The following factors support the admission status of inpatient.     The patient's presenting symptoms include fall The worrisome physical exam findings include tachycardia The initial radiographic and laboratory data are worrisome because of hip fracture The chronic co-morbidities include hypertension, hyperlipidemia, mitral regurg, depression       * I certify that at the point of admission it is my clinical judgment that the patient will require inpatient hospital care  spanning beyond 2 midnights from the point of admission due to high intensity of service, high risk for further deterioration and high frequency of surveillance required.*  Time spent in minutes :[place]

## 2021-03-24 NOTE — Transfer of Care (Signed)
Immediate Anesthesia Transfer of Care Note  Patient: Norma Barajas  Procedure(s) Performed: INTRAMEDULLARY (IM) NAIL INTERTROCHANTRIC (Right Hip)  Patient Location: PACU  Anesthesia Type:General  Level of Consciousness: drowsy  Airway & Oxygen Therapy: Patient Spontanous Breathing and Patient connected to nasal cannula oxygen  Post-op Assessment: Report given to RN and Post -op Vital signs reviewed and stable  Post vital signs: Reviewed and stable  Last Vitals:  Vitals Value Taken Time  BP 89/49   Temp 98   Pulse 69   Resp 12 03/24/21 1325  SpO2 97%   Vitals shown include unvalidated device data.  Last Pain:  Vitals:   03/24/21 1051  TempSrc: Oral  PainSc:       Patients Stated Pain Goal: 6 (09/01/84 2778)  Complications: No complications documented.

## 2021-03-24 NOTE — Anesthesia Postprocedure Evaluation (Signed)
Anesthesia Post Note  Patient: Norma Barajas  Procedure(s) Performed: INTRAMEDULLARY (IM) NAIL INTERTROCHANTRIC (Right Hip)  Patient location during evaluation: Phase II Anesthesia Type: General Level of consciousness: awake Pain management: pain level controlled Vital Signs Assessment: post-procedure vital signs reviewed and stable Respiratory status: spontaneous breathing and respiratory function stable Cardiovascular status: blood pressure returned to baseline and stable Postop Assessment: no headache and no apparent nausea or vomiting Anesthetic complications: no Comments: Late entry   No complications documented.   Last Vitals:  Vitals:   03/24/21 1400 03/24/21 1415  BP: (!) 123/59 117/63  Pulse: 80 75  Resp: 17 15  Temp:    SpO2: 96% 96%    Last Pain:  Vitals:   03/24/21 1415  TempSrc:   PainSc: [place]

## 2021-03-24 NOTE — Anesthesia Procedure Notes (Signed)
Procedure Name: LMA Insertion Date/Time: 03/24/2021 12:03 PM Performed by: Karna Dupes, CRNA Pre-anesthesia Checklist: Patient identified, Emergency Drugs available, Suction available and Patient being monitored Patient Re-evaluated:Patient Re-evaluated prior to induction Oxygen Delivery Method: Circle system utilized Preoxygenation: Pre-oxygenation with 100% oxygen Induction Type: IV induction LMA: LMA inserted LMA Size: 3.0 Tube secured with: Tape Dental Injury: Teeth and Oropharynx as per pre-operative assessment

## 2021-03-24 NOTE — Op Note (Signed)
Orthopaedic Surgery Operative Note (CSN: [id number])  Norma Barajas  May 07, 1941 Date of Surgery: 03/24/2021   Diagnoses:  Right intertrochanteric femur fracture  Procedure: Cephalomedullary nail for Right intertrochanteric femur fracture   Operative Finding Successful completion of the planned procedure.  Placement of 43mm x 36 cm nail with lag screw and distal interlocking screw    Post-Op Diagnosis: Same Surgeons:Primary: Mordecai Rasmussen, MD Assistants:  Marquita Palms Location: [facility] OR ROOM 3 Anesthesia: General with local anesthesia Antibiotics: Ancef 2 g Tourniquet time: N/A Estimated Blood Loss: 127 cc Complications: None Specimens: None Implants: Implant Name Type Inv. Item Serial No. Manufacturer Lot No. LRB No. Used Action  NAIL LONG ES 125D 11X36 - [id number] Nail NAIL LONG ES 125D 11X36  [employer] STERILE ON SET Right 1 Implanted  SCREW LOCK LAG 10.5X95 GALILEO - [id number] Screw SCREW LOCK LAG 10.5X95 GALILEO  [employer] STERILE ON SET Right 1 Implanted  SCREW LOCK CORT 5X34 - [id number] Screw SCREW LOCK CORT 5X34  [employer] STERILE ON SET Right 1 Implanted    Indications for Surgery:   Norma Barajas is a 80 y.o. female who had a mechanical fall and sustained a Right intertrochanteric femur fracture.  I recommended operative fixation to restore stability and allow the patient to ambulate immediately postop.  Benefits and risks of operative and nonoperative management were discussed prior to surgery with patient/guardian(s) and informed consent form was completed.  Specific risks including infection, need for additional surgery, persistent pain, bleeding, malunion, nonunion and more severe complications associated with anesthesia.  The patient elected to proceed and surgical consent was obtained.    Procedure:   The patient was identified properly. Informed consent was obtained and the surgical site was marked. The patient was taken to the OR where general  anesthesia was induced.   The patient was placed supine on a fracture table and appropriate reduction was obtained and visualized on fluoroscopy prior to the beginning of the procedure.  Timeout was performed before the beginning of the case.  Ancef 2 g dosing was confirmed prior to making incision.  The patient received TXA prior to the start of surgery.   We made an incision proximal to the greater trochanter and dissected down through the fascia.  We then carefully placed a guidepin, localizing under fluoroscopy.  Once satisfied with the starting point, the entry reamer was used to gain entry into the intramedullary canal.  A ball tip guidewire was then introduced and passed to an appropriate level at the physeal scar of the distal femur and measurement was obtained proximally using fluoroscopy.  We selected the appropriate length of nail, as noted above.  Entry reamer was used needed but the nail was unreamed.  At this point we placed our nail localizing under fluoroscopy, and confirmed that it was at the appropriate level.  Next we used the outrigger device to pass a wire into the femoral neck, and then the cephalomedullary lag screw.  The screw was activated to allow collapse.  We then turned our attention to the distal interlocking screw.  Once again, we used the outrigger device to place a single interlocking screw in the midshaft area.  The outrigger device was removed and final fluoroscopic images were obtained.  The wounds were thoroughly irrigated closed in a multilayer fashion with 0 vicryl, 2-0 monocryl and staples.  Sterile dressings were placed.  The patient was awoken from general anesthesia and taken to the PACU in stable condition without  complication.     Post-operative plan:  Weightbearing: The patient will be WBAT on the operative extremity.   DVT prophylaxis per primary team, no orthopedic contraindications.  Recommend 81 mg Aspirin BID, unless patient cannot tolerate or was  previously on anticoagulation.  Prefer to start Ppx POD#1 Pain control with PRN pain medication preferring oral medicines.   Dressing can be reinforced as needed, will change on POD#2/3 if needed.  Patient does not need to remain hospitalized for dressing change Follow up plan: approximately 2 weeks postop for incision check and XR.  If the patient will be returning to a nursing facility, staples can be removed around this time and a follow up appointment can be scheduled for 6 weeks after surgery. XR at next visit:  please obtain [facility] pelvis, and 2 views of the Right hip

## 2021-03-24 NOTE — Progress Notes (Signed)
Initial Nutrition Assessment  DOCUMENTATION CODES: Not applicable  INTERVENTION:  Advance diet as medically able.  Add Ensure Enlive po BID, each supplement provides 350 kcal and 20 grams of protein.  Add MVI with minerals daily.  NUTRITION DIAGNOSIS:  Increased nutrient needs related to post-op healing as evidenced by estimated needs.  GOAL:  Patient will meet greater than or equal to 90% of their needs  MONITOR:  Diet advancement,PO intake,Supplement acceptance,Labs,Weight trends,Skin,I & O's  REASON FOR ASSESSMENT:  Malnutrition Screening Tool    ASSESSMENT:  80 yo female with a PMH of vertigo, PAF, osteoporosis, mitral regurgitation, hypercholesterolemia, GERD, HTN, diastolic dysfunction who presents with hip fracture after a fall at home.  Pt in OR for procedure.   Spoke with pt's husband. He reports that pt spent 2 months in [facility] hospital/rebab in December and January 2022. He reports that she weighed 135 lbs then dropped to 95 lbs because she had issues with swallowing and it "was not being addressed." She has since been home and eating extremely well - they have breakfast, a small lunch, and a big dinner meal. Her weight has since increased from 95 lbs to 110 lbs.  Per Epic, pt's weight has fluctuated between 47-50 kg for the last 7 months.  Given that pt has been eating very well for the past 3 months and has been gaining weight back, pt at risk for malnutrition, but likely not malnourished at this time - unable to officially determine without NFPE.  Recommend adding Ensure Enlive BID once diet is advanced for increased nutrition needs for healing. Also recommend MVI with minerals.  Medications: reviewed; IVF @ 75 ml/hr, Ancef, oxycodone PRN (given once today)  Labs: reviewed; K 3.2, Glucose 127  NUTRITION - FOCUSED PHYSICAL EXAM: Unable to complete at this time  Diet Order:   Diet Order            Diet NPO time specified Except for: Sips with Meds, Ice Chips   Diet effective midnight                EDUCATION NEEDS:  No education needs have been identified at this time  Skin:  Skin Assessment: Reviewed RN Assessment  Last BM:  03/22/21  Height:  Ht Readings from Last 1 Encounters:  03/24/21 5\' 1"  (1.549 m)   Weight:  Wt Readings from Last 1 Encounters:  03/24/21 49.8 kg   Ideal Body Weight:  47.7 kg  BMI:  Body mass index is 20.74 kg/m.  Estimated Nutritional Needs:  Kcal:  1500-1700 Protein:  65-80 grams Fluid:  >1.5 L  Derrel Nip, RD, LDN Registered Dietitian After Hours/Weekend Pager # in [phone]

## 2021-03-24 NOTE — Progress Notes (Signed)
Patient seen and examined.  Admitted after midnight secondary to mechanical fall and right hip fracture.  Hemodynamically stable at this time and reporting no chest pain, no nausea, no vomiting.  She is experiencing discomfort with any movement of her right lower extremity and is in agreement to proceed with surgical repair.  Please refer to H&P dictated by Dr. Clearence Ped on 03/24/21 for further info/details.   Barton Dubois MD [phone]

## 2021-03-24 NOTE — Anesthesia Preprocedure Evaluation (Signed)
Anesthesia Evaluation  Patient identified by MRN, date of birth, ID band Patient awake    Reviewed: Allergy & Precautions, H&P , NPO status , Patient's Chart, lab work & pertinent test results, reviewed documented beta blocker date and time   History of Anesthesia Complications (+) PONV, PROLONGED EMERGENCE and history of anesthetic complications  Airway Mallampati: II  TM Distance: >3 FB Neck ROM: full    Dental no notable dental hx.    Pulmonary neg pulmonary ROS,    Pulmonary exam normal breath sounds clear to auscultation       Cardiovascular Exercise Tolerance: Good hypertension, + CAD and +CHF  negative cardio ROS   Rhythm:regular Rate:Normal     Neuro/Psych  Headaches, PSYCHIATRIC DISORDERS Anxiety Depression  Neuromuscular disease    GI/Hepatic Neg liver ROS, hiatal hernia, PUD, GERD  Medicated,  Endo/Other  negative endocrine ROS  Renal/GU negative Renal ROS  negative genitourinary   Musculoskeletal negative musculoskeletal ROS (+)   Abdominal   Peds negative pediatric ROS (+)  Hematology negative hematology ROS (+)   Anesthesia Other Findings   Reproductive/Obstetrics negative OB ROS                             Anesthesia Physical Anesthesia Plan  ASA: III  Anesthesia Plan: General and General LMA   Post-op Pain Management:    Induction:   PONV Risk Score and Plan: Ondansetron  Airway Management Planned:   Additional Equipment:   Intra-op Plan:   Post-operative Plan:   Informed Consent: I have reviewed the patients History and Physical, chart, labs and discussed the procedure including the risks, benefits and alternatives for the proposed anesthesia with the patient or authorized representative who has indicated his/her understanding and acceptance.     Dental Advisory Given  Plan Discussed with: CRNA  Anesthesia Plan Comments:         Anesthesia  Quick Evaluation

## 2021-03-25 ENCOUNTER — Encounter (HOSPITAL_COMMUNITY): Payer: Self-pay | Admitting: Orthopedic Surgery

## 2021-03-25 DIAGNOSIS — R7401 Elevation of levels of liver transaminase levels: Secondary | ICD-10-CM

## 2021-03-25 DIAGNOSIS — S72143A Displaced intertrochanteric fracture of unspecified femur, initial encounter for closed fracture: Secondary | ICD-10-CM

## 2021-03-25 DIAGNOSIS — S72141G Displaced intertrochanteric fracture of right femur, subsequent encounter for closed fracture with delayed healing: Secondary | ICD-10-CM

## 2021-03-25 DIAGNOSIS — I5082 Biventricular heart failure: Secondary | ICD-10-CM

## 2021-03-25 DIAGNOSIS — I5032 Chronic diastolic (congestive) heart failure: Secondary | ICD-10-CM

## 2021-03-25 DIAGNOSIS — I482 Chronic atrial fibrillation, unspecified: Secondary | ICD-10-CM

## 2021-03-25 LAB — PROCALCITONIN: Procalcitonin: 5.86 ng/mL

## 2021-03-25 MED ORDER — AZITHROMYCIN 250 MG PO TABS
500.0000 mg | ORAL_TABLET | Freq: Every day | ORAL | Status: DC
  Administered 2021-03-25 – 2021-03-26 (×2): 500 mg via ORAL
  Filled 2021-03-25 (×2): qty 2

## 2021-03-25 MED ORDER — SODIUM CHLORIDE 0.9 % IV SOLN
1.0000 g | INTRAVENOUS | Status: DC
  Administered 2021-03-25: 1 g via INTRAVENOUS
  Filled 2021-03-25: qty 10

## 2021-03-25 NOTE — Progress Notes (Signed)
PROGRESS NOTE  Norma Barajas EFE:[id number] DOB: [birth year] DOA: 03/23/2021 PCP: Loman Brooklyn, FNP  Brief History:  80 y.o.femalewith medical history significant ofhypertension, hyperlipidemia, atrial fibrillation on anticoagulation, presents to the emergency room after falling trying to get up from her couch.   She reports that before the fall she did not experience any dizziness, chest pain, palpitations.  She does not think she hit her head, and she knows she did not lose consciousness.  Patient denies any fevers, cough, dysuria, generalized weakness.  She reports she has had normal appetite.  She denies any nausea vomiting or diarrhea.  Patient does not remember exactly how she landed.  She does note that her right hip was immediately pain and she is not able to get up on her own.  She states she was on floor about 20-30 minutes before EMS arrived to pick her up.   In the ED Temperature 100.7, heart rate 78-1 04, respiratory rate 19-28, blood pressure 126/63, satting at 98% No leukocytosis Chemistry panel is mostly unremarkable Alk phos slightly elevated as expected at 205 Liver function is more elevated than normal with an AST of 1131, ALT of 491, T bili 2.7 Respiratory panel negative UA negative CT abdomen pelvis shows infected hematoma versus abscess, acute and comminuted intertrochanteric fracture involving the proximal right femur Chest x-ray shows no active disease Ortho recommends no DVT prophylaxis, IV fluids, n.p.o. Admission requested for medical management   Assessment/Plan: Right Intertrochanteric femur fracture -appreciate ortho -03/24/21--IM nail -PT>>SNF which pt currently refuses -judicious opioids  Transaminasemia -likely due to hemodynamic changes/?hepatic congestion from RV dysfunction -RUQ US--GB sludge without thickening or pericholecystic fluid, hepatic cystic focus R-lobe -trending down -viral hepatitis serologies neg  Chronic diastolic  CHF/RV dysfunction -appears clinically euvolemic -restart home dose lasix -09/09/20 Echo -EchoEF 50-55%, no WMA, RV volume overload, mild-mod MR, severe TR -continue home torsemide dose -follows Dr. Percival Spanish  Chronic Atrial fibrillation -has been treated with amiodarone and DCCV and she had recurrent fib.  She was to have another DCCV but she declined and wanted to pursue rate control with amiodarone.   -CHADS-VASc = 4 -resume warfarin  Hypertension -Blood pressure currently elevated -Continue amlodipine and metoprolol  Hyperlipidemia -Hold statin for now due to elevated LFTs     Status is: Inpatient  Remains inpatient appropriate because:IV treatments appropriate due to intensity of illness or inability to take PO   Dispo: The patient is from: Home              Anticipated d/c is to: Home              Patient currently is not medically stable to d/c.   Difficult to place patient No        Family Communication:   Niece updated 03/25/21  Consultants:  ortho  Code Status:  DNR  DVT Prophylaxis:  coumadin   Procedures: As Listed in Progress Note Above  Antibiotics: None   Total time spent 35 minutes.  Greater than 50% spent face to face counseling and coordinating care.     Subjective:  Patient denies fevers, chills, headache, chest pain, dyspnea, nausea, vomiting, diarrhea, abdominal pain, dysuria, hematuria, hematochezia, and melena.  Objective: Vitals:   03/24/21 1848 03/24/21 2203 03/25/21 0501 03/25/21 1342  BP: 121/70 126/66 118/67 104/61  Pulse: 75 80 (!) 54 67  Resp: _0 Temp: 97.6 F (36.4 C) 98.4 F (36.9 C)  97.6 F (36.4 C) 98 F (36.7 C)  TempSrc: Oral Oral Oral Oral  SpO2: 98% 98% 93% 94%  Weight:      Height:        Intake/Output Summary (Last 24 hours) at 03/25/2021 1711 Last data filed at 03/25/2021 1645 Gross per 24 hour  Intake 120 ml  Output 2050 ml  Net -1930 ml   Weight change:  Exam:   General:  Pt is  alert, follows commands appropriately, not in acute distress  HEENT: No icterus, No thrush, No neck mass, [place]/AT  Cardiovascular: IRRR, S1/S2, no rubs, no gallops  Respiratory: bibasilar crackles. No wheeze  Abdomen: Soft/+BS, non tender, non distended, no guarding  Extremities: No edema, No lymphangitis, No petechiae, No rashes, no synovitis   Data Reviewed: I have personally reviewed following labs and imaging studies Basic Metabolic Panel: Recent Labs  Lab 03/23/21 1629 03/24/21 0657  NA 134* 139  K 3.4* 3.2*  CL 96* 98  CO2 27 30  GLUCOSE 130* 127*  BUN 21 19  CREATININE 0.90 0.79  CALCIUM 9.0 8.1*  MG  --  2.0   Liver Function Tests: Recent Labs  Lab 03/23/21 1629 03/24/21 0657  AST 1,131* 336*  ALT 491* 293*  ALKPHOS 205* 158*  BILITOT 2.7* 3.2*  PROT 7.5 6.7  ALBUMIN 3.8 3.1*   No results for input(s): LIPASE, AMYLASE in the last 168 hours. No results for input(s): AMMONIA in the last 168 hours. Coagulation Profile: Recent Labs  Lab 03/23/21 2201 03/24/21 0657  INR 1.6* 1.7*   CBC: Recent Labs  Lab 03/23/21 1629 03/24/21 0657  WBC 10.7* 12.6*  NEUTROABS 9.6*  --   HGB 14.5 12.0  HCT 41.5 36.2  MCV 93.5 94.8  PLT 178 130*   Cardiac Enzymes: No results for input(s): CKTOTAL, CKMB, CKMBINDEX, TROPONINI in the last 168 hours. BNP: Invalid input(s): POCBNP CBG: No results for input(s): GLUCAP in the last 168 hours. HbA1C: No results for input(s): HGBA1C in the last 72 hours. Urine analysis:    Component Value Date/Time   COLORURINE YELLOW 03/23/2021 [place] 03/23/2021 1526   APPEARANCEUR Clear 07/31/2020 1545   LABSPEC 1.008 03/23/2021 1526   PHURINE 8.0 03/23/2021 1526   GLUCOSEU NEGATIVE 03/23/2021 [place] 03/23/2021 [place] 03/23/2021 1526   BILIRUBINUR Negative 07/31/2020 [place] 03/23/2021 [place] 03/23/2021 1526   UROBILINOGEN negative  02/06/2015 1252   NITRITE NEGATIVE 03/23/2021 [place] 03/23/2021 1526   Sepsis Labs: _0 (procalcitonin:4,lacticidven:4) ) Recent Results (from the past 240 hour(s))  Resp Panel by RT-PCR (Flu A&B, Covid) Urine, Clean Catch     Status: None   Collection Time: 03/23/21  3:28 PM   Specimen: Urine, Clean Catch; Nasopharyngeal(NP) swabs in vial transport medium  Result Value Ref Range Status   SARS Coronavirus 2 by RT PCR NEGATIVE NEGATIVE Final    Comment: (NOTE) SARS-CoV-2 target nucleic acids are NOT DETECTED.  The SARS-CoV-2 RNA is generally detectable in upper respiratory specimens during the acute phase of infection. The lowest concentration of SARS-CoV-2 viral copies this assay can detect is 138 copies/mL. A negative result does not preclude SARS-Cov-2 infection and should not be used as the sole basis for treatment or other patient management decisions. A negative result may occur with  improper specimen collection/handling, submission of specimen other than nasopharyngeal swab, presence of viral mutation(s) within the areas targeted by  this assay, and inadequate number of viral copies(<138 copies/mL). A negative result must be combined with clinical observations, patient history, and epidemiological information. The expected result is Negative.  Fact Sheet for Patients:  EntrepreneurPulse.com.au  Fact Sheet for Healthcare Providers:  IncredibleEmployment.be  This test is no t yet approved or cleared by the [place] FDA and  has been authorized for detection and/or diagnosis of SARS-CoV-2 by FDA under an Emergency Use Authorization (EUA). This EUA will remain  in effect (meaning this test can be used) for the duration of the COVID-19 declaration under Section 564(b)(1) of the Act, 21 U.S.C.section 360bbb-3(b)(1), unless the authorization is terminated  or revoked sooner.       Influenza A by PCR  NEGATIVE NEGATIVE Final   Influenza B by PCR NEGATIVE NEGATIVE Final    Comment: (NOTE) The Xpert Xpress SARS-CoV-2/FLU/RSV plus assay is intended as an aid in the diagnosis of influenza from Nasopharyngeal swab specimens and should not be used as a sole basis for treatment. Nasal washings and aspirates are unacceptable for Xpert Xpress SARS-CoV-2/FLU/RSV testing.  Fact Sheet for Patients: EntrepreneurPulse.com.au  Fact Sheet for Healthcare Providers: IncredibleEmployment.be  This test is not yet approved or cleared by the [place] FDA and has been authorized for detection and/or diagnosis of SARS-CoV-2 by FDA under an Emergency Use Authorization (EUA). This EUA will remain in effect (meaning this test can be used) for the duration of the COVID-19 declaration under Section 564(b)(1) of the Act, 21 U.S.C. section 360bbb-3(b)(1), unless the authorization is terminated or revoked.  Performed at [facility], [address] [place], [place] [address]   MRSA PCR Screening     Status: None   Collection Time: 03/24/21 10:00 AM   Specimen: Nasal Mucosa; Nasopharyngeal  Result Value Ref Range Status   MRSA by PCR NEGATIVE NEGATIVE Final    Comment:        The GeneXpert MRSA Assay (FDA approved for NASAL specimens only), is one component of a comprehensive MRSA colonization surveillance program. It is not intended to diagnose MRSA infection nor to guide or monitor treatment for MRSA infections. Performed at [facility], [address] [place], [place] [address]      Scheduled Meds: . amLODipine  10 mg Oral Daily  . Chlorhexidine Gluconate Cloth  6 each Topical Daily  . feeding supplement  237 mL Oral BID BM  . metoprolol tartrate  50 mg Oral BID  . multivitamin with minerals  1 tablet Oral Daily  . torsemide  20 mg Oral Daily   Continuous Infusions: . sodium chloride 75 mL/hr at 03/25/21 0452    Procedures/Studies: CT Abdomen  Pelvis Wo Contrast  Result Date: 03/23/2021 CLINICAL DATA:  Abdominal pain and fever. EXAM: CT ABDOMEN AND PELVIS WITHOUT CONTRAST TECHNIQUE: Multidetector CT imaging of the abdomen and pelvis was performed following the standard protocol without IV contrast. COMPARISON:  09/07/2020 FINDINGS: Lower chest: Small subpleural consolidation and atelectasis noted within both lung bases. Linear areas of scarring and bronchiectasis noted bilaterally. Hepatobiliary: There are 2 simple appearing cysts noted within the right hepatic lobe. Small amount of sludge noted layering within the gallbladder. No gallbladder wall thickening or pericholecystic fluid. No bile duct dilatation. Pancreas: Unremarkable. No pancreatic ductal dilatation or surrounding inflammatory changes. 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 This measures 6 mm, image 23/2. No suspicious pancreatic mass, inflammation or main duct dilatation. Adrenals/Urinary Tract: Adrenal glands are unremarkable. Kidneys are normal, without renal calculi, focal lesion,  or hydronephrosis. Bladder is unremarkable. Stomach/Bowel: Unchanged right adrenal gland adenoma measuring 1.6 cm. Normal left adrenal gland. No kidney stones identified bilaterally. No hydronephrosis. No ureteral calculi. Urinary bladder appears unremarkable. Vascular/Lymphatic: Stomach appears normal. The appendix is not confidently identified separate from the right lower quadrant bowel loops. There is no bowel wall thickening, inflammation, or distension. Reproductive: Previous hysterectomy.  No adnexal mass. Other: There is a thick wall, fluid attenuating mass along the right pelvic sidewall which measures 4.8 x 3.0 cm, image 61/2. This is a new finding when compared with previous exam. Musculoskeletal: Remote healed bilateral pubic rami fractures are identified. Acute and comminuted intertrochanteric fracture involving the proximal right femur is again noted. IMPRESSION:  1. There is a thick wall, fluid attenuating mass along the right pelvic sidewall which is a new finding when compared with previous exam. Primary differential considerations include infected hematoma versus abscess. 2. Acute and comminuted intertrochanteric fracture involving the proximal right femur is again noted. 3. Unchanged right adrenal gland adenoma. 4. Small subpleural consolidation and atelectasis noted within both lung bases. Electronically Signed   By: Kerby Moors M.D.   On: 03/23/2021 21:07   DG Chest 1 View  Result Date: 03/23/2021 CLINICAL DATA:  Fall with hip pain EXAM: CHEST  1 VIEW COMPARISON:  10/25/2020 FINDINGS: Linear scarring or atelectasis at the left base. No focal consolidation or effusion. Mild cardiomegaly with aortic atherosclerosis. No pneumothorax. IMPRESSION: No active disease.  Atelectasis or scar at the left base. Electronically Signed   By: Donavan Foil M.D.   On: 03/23/2021 17:06   DG Knee Complete 4 Views Right  Result Date: 03/23/2021 CLINICAL DATA:  Fall with hip pain EXAM: RIGHT KNEE - COMPLETE 4+ VIEW COMPARISON:  12/11/2018 FINDINGS: No acute fracture or malalignment. Status post surgical plate and multiple screw fixation of the proximal tibia. Old proximal fibular fracture. No significant knee effusion IMPRESSION: No acute osseous abnormality. Electronically Signed   By: Donavan Foil M.D.   On: 03/23/2021 17:35   DG HIP OPERATIVE UNILAT WITH PELVIS RIGHT  Result Date: 03/24/2021 CLINICAL DATA:  Right femur fracture.  ORIF. EXAM: OPERATIVE RIGHT HIP (WITH PELVIS IF PERFORMED) 10 VIEWS TECHNIQUE: Fluoroscopic spot image(s) were submitted for interpretation post-operatively. COMPARISON:  Pelvic and right hip radiographs 03/23/2021 FINDINGS: C-arm fluoroscopy provided in the operating room. 1 minutes and 4 seconds of fluoroscopy. 9.48 mGy. Ten spot fluoroscopic images of the right femur are submitted, demonstrating the placement of a dynamic right hip screw and  intramedullary nail. A single distal interlocking screws noted. There is improved alignment of the intertrochanteric right femur fracture. No complications identified. Underlying old fractures of the pubic rami are partially imaged. IMPRESSION: Intraoperative views during ORIF of the comminuted intertrochanteric right hip fracture. Electronically Signed   By: Richardean Sale M.D.   On: 03/24/2021 14:15   DG Hip Unilat With Pelvis 2-3 Views Right  Result Date: 03/23/2021 CLINICAL DATA:  Fall with hip fracture EXAM: DG HIP (WITH OR WITHOUT PELVIS) 2-3V RIGHT COMPARISON:  CT 09/07/2020 FINDINGS: SI joints are non widened. Pubic symphysis is intact. Chronic bilateral pubic rami fractures. Acute comminuted and displaced right intertrochanteric fracture. No femoral head dislocation IMPRESSION: 1. Acute comminuted right intertrochanteric fracture. 2. Chronic pubic rami fractures Electronically Signed   By: Donavan Foil M.D.   On: 03/23/2021 17:08   DG FEMUR PORT, MIN 2 VIEWS RIGHT  Result Date: 03/24/2021 CLINICAL DATA:  Postop right hip surgery EXAM: OPERATIVE right HIP (WITH PELVIS  IF PERFORMED) 5 VIEWS TECHNIQUE: Fluoroscopic spot image(s) were submitted for interpretation post-operatively. COMPARISON:  Hip radiograph 03/23/2021 FINDINGS: Three there has been intramedullary rod screw fixation for the comminuted right intertrochanteric femur fracture. Intact hardware without evidence of loosening. There is partially visualized fixation hardware in the proximal tibia. There is a chronic right inferior pubic ramus fracture. Expected soft tissue changes. Vascular calcifications. IMPRESSION: Interval ORIF of the intertrochanteric right femur fracture without evidence of immediate hardware complication. Electronically Signed   By: Maurine Simmering   On: 03/24/2021 14:21   US Abdomen Limited RUQ (LIVER/GB)  Result Date: 03/24/2021 CLINICAL DATA:  Elevated liver enzymes EXAM: ULTRASOUND ABDOMEN LIMITED RIGHT UPPER  QUADRANT COMPARISON:  CT abdomen and pelvis Mar 23, 2021 FINDINGS: Gallbladder: There is sludge in the gallbladder. No well-defined gallstones. Tiny intermingled gallstones are difficult to exclude. No gallbladder wall thickening or pericholecystic fluid. No sonographic Murphy sign noted by sonographer. Common bile duct: Diameter: 5 mm. No intrahepatic or extrahepatic biliary duct dilatation. Liver: Mildly septated cystic mass in the right lobe of liver measuring 2.2 x 1.5 x 1.4 cm, also present on recent CT. Within normal limits in parenchymal echogenicity. Portal vein is patent on color Doppler imaging with normal direction of blood flow towards the liver. Other: None. IMPRESSION: 1. Layering sludge in gallbladder. Although focal gallstones are evident, tiny intermingled gallstones are difficult to exclude in this circumstance. No gallbladder wall thickening or pericholecystic fluid. 2. Mildly septated cystic focus in the right lobe of the liver measuring 2.2 x 1.5 x 1.4 cm. No other focal liver lesion appreciable by ultrasound. Electronically Signed   By: Lowella Grip III M.D.   On: 03/24/2021 12:42    Orson Eva, DO  [facility]  If 7PM-7AM, please contact night-coverage [email or web address] Password [email or web address] 03/25/2021, 5:11 PM   LOS: 2 days

## 2021-03-25 NOTE — TOC Initial Note (Signed)
Transition of Care [facility]) - Initial/Assessment Note    Patient Details  Name: Norma Barajas MRN: [id number] Date of Birth: 04/24/1941  [facility] of [facility] CM/SW Contact:    Boneta Lucks, RN Phone Number: 03/25/2021, 3:01 PM  Clinical Narrative:     Patient admitted with hip fracture, lives at home with her husband. Has walker, wheelchair, 3N1, and handicap ramp. PT evaluation is pending. Patient is tearful and not wanting SNF. She was at [facility] recently and does not want to go SNF.  MD and RN updated. Patient has used [facility] in the past and wanting home health again.  TOC will check back with patient after PT eval.       Expected Discharge Plan: [place] Barriers to Discharge: Continued Medical Work up  Patient Goals and CMS Choice Patient states their goals for this hospitalization and ongoing recovery are:: to go home. CMS Medicare.gov Compare Post Acute Care list provided to:: Patient Choice offered to / list presented to : Patient  Expected Discharge Plan and Services Expected Discharge Plan: [place]    Living arrangements for the past 2 months: Single Family Home       Prior Living Arrangements/Services Living arrangements for the past 2 months: Single Family Home Lives with:: Spouse          Need for Family Participation in Patient Care: Yes (Comment) Care giver support system in place?: Yes (comment) Current home services: DME Criminal Activity/Legal Involvement Pertinent to Current Situation/Hospitalization: No - Comment as needed  Activities of Daily Living Home Assistive Devices/Equipment: None ADL Screening (condition at time of admission) Patient's cognitive ability adequate to safely complete daily activities?: Yes Is the patient deaf or have difficulty hearing?: No Does the patient have difficulty seeing, even when wearing glasses/contacts?: No Does the patient have difficulty concentrating, remembering, or making  decisions?: No Patient able to express need for assistance with ADLs?: Yes Does the patient have difficulty dressing or bathing?: No Independently performs ADLs?: Yes (appropriate for developmental age) Does the patient have difficulty walking or climbing stairs?: Yes Weakness of Legs: Both Weakness of Arms/Hands: None  Permission Sought/Granted     Emotional Assessment     Affect (typically observed): Sad,Tearful/Crying Orientation: : Oriented to Self,Oriented to Place,Oriented to  Time,Oriented to Situation Alcohol / Substance Use: Not Applicable Psych Involvement: No (comment)  Admission diagnosis:  Hip fracture ([place]) [S72.009A] Transaminitis [R74.01] Fall [W19.XXXA] Closed fracture of right hip, initial encounter [facility]) [S72.001A] Patient Active Problem List   Diagnosis Date Noted  . Hip fracture ([place]) 03/23/2021  . Chronic diastolic HF (heart failure) ([place]) 03/15/2021  . Major depressive disorder, recurrent, moderate ([place]) 12/09/2020  . Fracture of ramus of right pubis with routine healing 12/09/2020  . Unintended weight loss 12/09/2020  . 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 ([place]) 10/23/2020  . Esophageal dysphagia   . RUQ abdominal pain   . Biliary colic   . Cholecystitis 09/07/2020  . Early satiety 07/15/2020  . Anorexia 07/15/2020  . Abdominal pain 07/15/2020  . Chronic anticoagulation 07/17/2019  . Difficulty sleeping 06/22/2019  . Paroxysmal atrial fibrillation ([place]) 08/21/2015  . Vitamin D deficiency 11/27/2014  . Coronary atherosclerosis of native coronary artery 07/24/2013  . Pulmonary nodule 02/18/2011  . Hyperlipidemia 07/29/2008  . GAD (generalized anxiety disorder) 07/29/2008  . Essential hypertension 07/29/2008  . GERD 07/29/2008  . Osteoporosis 07/29/2008   PCP:  Loman Brooklyn, FNP Pharmacy:  [place], [place]  [address] Phone: [phone] Fax: [phone]  [place], [place] [address] [place] [address] Phone: [phone] Fax: [phone]   Readmission Risk Interventions Readmission Risk Prevention Plan 03/25/2021  Medication Screening Complete  Transportation Screening Complete  Some recent data might be hidden

## 2021-03-25 NOTE — Plan of Care (Signed)
  Problem: Acute Rehab PT Goals(only PT should resolve) Goal: Pt Will Go Supine/Side To Sit Outcome: Progressing Flowsheets (Taken 03/25/2021 1212) Pt will go Supine/Side to Sit:  with min guard assist  with minimal assist Goal: Patient Will Transfer Sit To/From Stand Outcome: Progressing Flowsheets (Taken 03/25/2021 1212) Patient will transfer sit to/from stand:  with min guard assist  with minimal assist Goal: Pt Will Transfer Bed To Chair/Chair To Bed Outcome: Progressing Flowsheets (Taken 03/25/2021 1212) Pt will Transfer Bed to Chair/Chair to Bed:  min guard assist  with min assist Goal: Pt Will Ambulate Outcome: Progressing Flowsheets (Taken 03/25/2021 1212) Pt will Ambulate:  50 feet  with minimal assist  with rolling walker   12:13 PM, 03/25/21 Lonell Grandchild, MPT Physical Therapist with [facility] [phone] office [phone] mobile phone

## 2021-03-25 NOTE — Evaluation (Signed)
Physical Therapy Evaluation Patient Details Name: Norma Barajas MRN: [id number] DOB: 12/29/1940 Today's Date: 03/25/2021   History of Present Illness  Norma Barajas  is a 80 y.o. female, s/p Cephalomedullary nail for Right intertrochanteric femur fracture on 03/24/21  with history of vertigo, paroxysmal atrial fibrillation, osteoporosis, mitral regurgitation, hyper cholesterolemia, GERD, hypertension, diastolic dysfunction, and more presents ED with a chief complaint of fall.  Patient reports that she was getting up off the couch when she fell down.  She does not know why she fell.  She reports that before the fall she did not experience any dizziness, chest pain, palpitations.  She does not think she hit her head, and she knows she did not lose consciousness.  Patient denies any fevers, cough, dysuria, generalized weakness.  She reports she has had normal appetite.  She denies any nausea vomiting or diarrhea.  Patient does not remember exactly how she landed.  She does note that her right hip was immediately pain and she is not able to get up on her own.  Patient has no other complaints at this time.    Clinical Impression  Patient demonstrates fair/good return for propping up on elbows to hands with verbal/tactile cueing, required assistance to move RLE due to increased pain, slow labored cadence and limited to ambulation in room due to limited weightbearing on RLE due to pain and fatiguing.  Patient tolerated sitting up in chair after therapy - RN aware.  Patient will benefit from continued physical therapy in hospital and recommended venue below to increase strength, balance, endurance for safe ADLs and gait.     Follow Up Recommendations SNF;Supervision for mobility/OOB;Supervision - Intermittent    Equipment Recommendations  None recommended by PT    Recommendations for Other Services       Precautions / Restrictions Precautions Precautions: Fall Restrictions Weight Bearing Restrictions:  Yes RLE Weight Bearing: Weight bearing as tolerated      Mobility  Bed Mobility Overal bed mobility: Needs Assistance Bed Mobility: Supine to Sit     Supine to sit: Min assist;Mod assist     General bed mobility comments: increased time, labored movement, required Mod assist  to move LLE    Transfers Overall transfer level: Needs assistance Equipment used: Rolling walker (2 wheeled) Transfers: Sit to/from [facility] Sit to Stand: Min assist;Mod assist Stand pivot transfers: Min assist;Mod assist       General transfer comment: increased time, labored movement, required verbal cues for proper hand placement during sit to stands  Ambulation/Gait Ambulation/Gait assistance: Min assist;Mod assist Gait Distance (Feet): 20 Feet Assistive device: Rolling walker (2 wheeled) Gait Pattern/deviations: Decreased step length - right;Decreased step length - left;Decreased stance time - right;Decreased stride length;Antalgic Gait velocity: decreased   General Gait Details: slow labored cadence with limited weightbearing on RLE due to increased pain, limited mostly due to fatigue  Stairs            Wheelchair Mobility    Modified Rankin (Stroke Patients Only)       Balance Overall balance assessment: Needs assistance Sitting-balance support: Feet supported;No upper extremity supported Sitting balance-Leahy Scale: Fair Sitting balance - Comments: fair/good seated at EOB   Standing balance support: During functional activity;Bilateral upper extremity supported Standing balance-Leahy Scale: Poor Standing balance comment: fair/poor using RW                             Pertinent Vitals/Pain Pain Assessment:  Faces Faces Pain Scale: Hurts even more Pain Location: right hip with movement Pain Descriptors / Indicators: Sore;Grimacing;Guarding Pain Intervention(s): Limited activity within patient's tolerance;Monitored during  session;Repositioned;Patient requesting pain meds-RN notified    Home Living Family/patient expects to be discharged to:: Private residence Living Arrangements: Spouse/significant other Available Help at Discharge: Family;Available 24 hours/day Type of Home: House Home Access: Stairs to enter;Ramped entrance Entrance Stairs-Rails: Right Entrance Stairs-Number of Steps: 4 Home Layout: One level Home Equipment: Walker - 2 wheels;Cane - single point;Wheelchair - [employer];Shower seat      Prior Function Level of Independence: Independent with assistive device(s)         Comments: household and short distanced community ambulator using RW PRN, occasionally drives     Hand Dominance        Extremity/Trunk Assessment   Upper Extremity Assessment Upper Extremity Assessment: Defer to OT evaluation    Lower Extremity Assessment Lower Extremity Assessment: Generalized weakness;RLE deficits/detail RLE Deficits / Details: grossly 3+/5 RLE: Unable to fully assess due to pain RLE Sensation: WNL RLE Coordination: WNL    Cervical / Trunk Assessment Cervical / Trunk Assessment: Normal  Communication   Communication: No difficulties  Cognition Arousal/Alertness: Awake/alert Behavior During Therapy: WFL for tasks assessed/performed Overall Cognitive Status: Within Functional Limits for tasks assessed                                        General Comments      Exercises     Assessment/Plan    PT Assessment Patient needs continued PT services  PT Problem List Decreased strength;Decreased activity tolerance;Decreased balance;Decreased mobility       PT Treatment Interventions DME instruction;Stair training;Functional mobility training;Therapeutic activities;Gait training;Therapeutic exercise;Patient/family education;Balance training    PT Goals (Current goals can be found in the Care Plan section)  Acute Rehab PT Goals Patient Stated Goal:  return home with family to assist PT Goal Formulation: With patient Time For Goal Achievement: 04/08/21 Potential to Achieve Goals: Good    Frequency Min 4X/week   Barriers to discharge        Co-evaluation PT/OT/SLP Co-Evaluation/Treatment: Yes Reason for Co-Treatment: To address functional/ADL transfers PT goals addressed during session: Mobility/safety with mobility;Proper use of DME;Balance         AM-PAC PT "6 Clicks" Mobility  Outcome Measure Help needed turning from your back to your side while in a flat bed without using bedrails?: A Little Help needed moving from lying on your back to sitting on the side of a flat bed without using bedrails?: A Lot Help needed moving to and from a bed to a chair (including a wheelchair)?: A Little Help needed standing up from a chair using your arms (e.g., wheelchair or bedside chair)?: A Little Help needed to walk in hospital room?: A Lot Help needed climbing 3-5 steps with a railing? : A Lot 6 Click Score: 15    End of Session   Activity Tolerance: Patient tolerated treatment well;Patient limited by fatigue Patient left: in chair;with call bell/phone within reach;with chair alarm set Nurse Communication: Mobility status PT Visit Diagnosis: Unsteadiness on feet (R26.81);Other abnormalities of gait and mobility (R26.89);Muscle weakness (generalized) (M62.81)    Time: 4174-0814 PT Time Calculation (min) (ACUTE ONLY): 30 min   Charges:   PT Evaluation $PT Eval Moderate Complexity: 1 Mod PT Treatments $Therapeutic Activity: 23-37 mins  12:11 PM, 03/25/21 Lonell Grandchild, MPT Physical Therapist with [facility] [phone] office [phone] mobile phone

## 2021-03-25 NOTE — Evaluation (Signed)
Occupational Therapy Evaluation Patient Details Name: Norma Barajas MRN: [id number] DOB: August 19, 1941 Today's Date: 03/25/2021    History of Present Illness Norma Barajas  is a 80 y.o. female, s/p Cephalomedullary nail for Right intertrochanteric femur fracture on 03/24/21  with history of vertigo, paroxysmal atrial fibrillation, osteoporosis, mitral regurgitation, hyper cholesterolemia, GERD, hypertension, diastolic dysfunction, and more presents ED with a chief complaint of fall.  Patient reports that she was getting up off the couch when she fell down.  She does not know why she fell.  She reports that before the fall she did not experience any dizziness, chest pain, palpitations.  She does not think she hit her head, and she knows she did not lose consciousness.  Patient denies any fevers, cough, dysuria, generalized weakness.  She reports she has had normal appetite.  She denies any nausea vomiting or diarrhea.  Patient does not remember exactly how she landed.  She does note that her right hip was immediately pain and she is not able to get up on her own.  Patient has no other complaints at this time.   Clinical Impression   Pt agreeable to OT/PT co-evaluation. Pt tolerated treatment well with grimacing when R LE was moved during bed mobility. Pt demonstrates mild B UE weakness and requires Min to Mod A for sit to stand and stand pivot transfers. Pt requires min to mod A for supine to sit bed mobility as well. Slow labored movement noted throughout session. Pt will benefit from continued OT in the hospital and recommended venue below to increase strength, balance, and endurance for safe ADL's.     Follow Up Recommendations  SNF    Equipment Recommendations  None recommended by OT           Precautions / Restrictions Precautions Precautions: Fall Restrictions Weight Bearing Restrictions: Yes RLE Weight Bearing: Weight bearing as tolerated      Mobility Bed Mobility Overal bed mobility:  Needs Assistance Bed Mobility: Supine to Sit     Supine to sit: Min assist;Mod assist     General bed mobility comments: increased time, labored movement, required Mod assist  to move LLE    Transfers Overall transfer level: Needs assistance Equipment used: Rolling walker (2 wheeled) Transfers: Sit to/from [facility] Sit to Stand: Min assist;Mod assist Stand pivot transfers: Min assist;Mod assist       General transfer comment: increased time, labored movement, required verbal cues for proper hand placement during sit to stands    Balance Overall balance assessment: Needs assistance Sitting-balance support: Feet supported;No upper extremity supported Sitting balance-Leahy Scale: Fair Sitting balance - Comments: fair/good seated at EOB   Standing balance support: During functional activity;Bilateral upper extremity supported Standing balance-Leahy Scale: Poor Standing balance comment: fair/poor using RW                           ADL either performed or assessed with clinical judgement   ADL Overall ADL's : Needs assistance/impaired                     Lower Body Dressing: Total assistance;Bed level Lower Body Dressing Details (indicate cue type and reason): total assist to don socks at bed level Toilet Transfer: Minimal assistance;Moderate assistance;Stand-pivot;RW Toilet Transfer Details (indicate cue type and reason): simulated via EOB to chair using RW                 Vision Baseline  Vision/History: Wears glasses Wears Glasses: At all times Patient Visual Report: No change from baseline                  Pertinent Vitals/Pain Pain Assessment: Faces Faces Pain Scale: Hurts even more Pain Location: right hip with movement Pain Descriptors / Indicators: Sore;Grimacing;Guarding Pain Intervention(s): Limited activity within patient's tolerance;Monitored during session;Repositioned;Patient requesting pain meds-RN notified      Hand Dominance Right   Extremity/Trunk Assessment Upper Extremity Assessment Upper Extremity Assessment: Generalized weakness (4-/5 B shoulder flexion; 4/5 B shoulder abduction.)   Lower Extremity Assessment Lower Extremity Assessment: Defer to PT evaluation RLE Deficits / Details: grossly 3+/5 RLE: Unable to fully assess due to pain RLE Sensation: WNL RLE Coordination: WNL   Cervical / Trunk Assessment Cervical / Trunk Assessment: Normal   Communication Communication Communication: No difficulties   Cognition Arousal/Alertness: Awake/alert Behavior During Therapy: WFL for tasks assessed/performed Overall Cognitive Status: Within Functional Limits for tasks assessed                                                      Home Living Family/patient expects to be discharged to:: Private residence Living Arrangements: Spouse/significant other Available Help at Discharge: Family;Available 24 hours/day Type of Home: House Home Access: Stairs to enter;Ramped entrance Entrance Stairs-Number of Steps: 4 Entrance Stairs-Rails: Right Home Layout: One level     Bathroom Shower/Tub: Occupational psychologist: Handicapped height Bathroom Accessibility: Yes How Accessible: Accessible via walker Home Equipment: [place] - 2 wheels;Cane - single point;Wheelchair - [employer];Shower seat          Prior Functioning/Environment Level of Independence: Independent with assistive device(s)        Comments: household and short distanced community ambulator using RW PRN, occasionally drives        OT Problem List: Decreased strength;Decreased range of motion;Decreased activity tolerance;Impaired balance (sitting and/or standing);Pain      OT Treatment/Interventions: Self-care/ADL training;Therapeutic exercise;Therapeutic activities;DME and/or AE instruction;Patient/family education;Balance training;Energy conservation    OT Goals(Current  goals can be found in the care plan section) Acute Rehab OT Goals Patient Stated Goal: return home with family to assist OT Goal Formulation: With patient Time For Goal Achievement: 04/08/21 Potential to Achieve Goals: Good  OT Frequency: Min 2X/week               Co-evaluation PT/OT/SLP Co-Evaluation/Treatment: Yes Reason for Co-Treatment: To address functional/ADL transfers PT goals addressed during session: Mobility/safety with mobility;Proper use of DME;Balance OT goals addressed during session: ADL's and self-care;Strengthening/ROM                       End of Session Equipment Utilized During Treatment: Rolling walker Nurse Communication: Patient requests pain meds  Activity Tolerance: Patient tolerated treatment well Patient left: in chair;with call bell/phone within reach;with chair alarm set  OT Visit Diagnosis: Unsteadiness on feet (R26.81);History of falling (Z91.81);Muscle weakness (generalized) (M62.81);Pain Pain - Right/Left: Right Pain - part of body: Hip                Time: 8657-8469 OT Time Calculation (min): 21 min Charges:  OT General Charges $OT Visit: 1 Visit OT Evaluation $OT Eval Low Complexity: [place] OT, MOT   Larey Seat 03/25/2021, 12:20 PM

## 2021-03-25 NOTE — Progress Notes (Signed)
   ORTHOPAEDIC PROGRESS NOTE  s/p Procedure(s): INTRAMEDULLARY (IM) NAIL INTERTROCHANTERIC; CMN for R IT fracture  DOS: 03/24/21  SUBJECTIVE: Doing well this morning.  She has eaten breakfast, good appetite.  Occasional spasm pain in thigh.  No issues over night.   OBJECTIVE: PE:  Alert and oriented, no acute distress  Right hip/thigh incisions are clean, dry and intact Tolerates gentle ROM of the hip Active motion intact to TA/EHL Sensation intact throughout the right foot Toes are WWP   Vitals:   03/24/21 2203 03/25/21 0501  BP: 126/66 118/67  Pulse: 80 (!) 54  Resp: 17 18  Temp: 98.4 F (36.9 C) 97.6 F (36.4 C)  SpO2: 98% 93%   XR of the right hip demonstrates improved alignment with intramedullary rod fixation.  No acute injuries.  Implants well positioned.   ASSESSMENT: Norma Barajas is a 80 y.o. female doing well postoperatively.  PLAN: Weightbearing: WBAT RLE Insicional and dressing care: Reinforce dressings as needed Orthopedic device(s): None VTE prophylaxis: Resume Warfarin POD#1  Pain control: PO medications as needed.  Follow - up plan: 2 weeks; staples can be removed if DC to SNF.  Follow up in 6 weeks if no issues.    Contact information:     Orean Giarratano A. Amedeo Kinsman, MD [facility] [address] [place],  [place]  [address] Phone: [phone] Fax: [phone]

## 2021-03-25 NOTE — Plan of Care (Signed)
  Problem: Acute Rehab OT Goals (only OT should resolve) Goal: Pt. Will Perform Grooming Flowsheets (Taken 03/25/2021 1223) Pt Will Perform Grooming:  with min guard assist  standing Goal: Pt. Will Perform Lower Body Dressing Flowsheets (Taken 03/25/2021 1223) Pt Will Perform Lower Body Dressing:  with adaptive equipment  with min assist  sitting/lateral leans Goal: Pt. Will Transfer To Toilet Flowsheets (Taken 03/25/2021 1223) Pt Will Transfer to Toilet:  with min guard assist  with supervision  stand pivot transfer  grab bars  bedside commode Goal: Pt/Caregiver Will Perform Home Exercise Program Flowsheets (Taken 03/25/2021 1223) Pt/caregiver will Perform Home Exercise Program:  Increased strength  Both right and left upper extremity  With Supervision  Independently  Savilla Turbyfill OT, MOT

## 2021-03-26 ENCOUNTER — Inpatient Hospital Stay
Admission: RE | Admit: 2021-03-26 | Discharge: 2021-04-10 | Disposition: A | Discharge status: Home / Self Care | Payer: Medicare HMO | Source: Ambulatory Visit | Attending: Internal Medicine | Admitting: Internal Medicine

## 2021-03-26 DIAGNOSIS — M6281 Muscle weakness (generalized): Secondary | ICD-10-CM | POA: Diagnosis not present

## 2021-03-26 DIAGNOSIS — J181 Lobar pneumonia, unspecified organism: Secondary | ICD-10-CM | POA: Diagnosis not present

## 2021-03-26 DIAGNOSIS — I5032 Chronic diastolic (congestive) heart failure: Secondary | ICD-10-CM | POA: Diagnosis not present

## 2021-03-26 DIAGNOSIS — E782 Mixed hyperlipidemia: Secondary | ICD-10-CM | POA: Diagnosis not present

## 2021-03-26 DIAGNOSIS — I700 Atherosclerosis of aorta: Secondary | ICD-10-CM | POA: Diagnosis not present

## 2021-03-26 DIAGNOSIS — S72141D Displaced intertrochanteric fracture of right femur, subsequent encounter for closed fracture with routine healing: Secondary | ICD-10-CM | POA: Diagnosis not present

## 2021-03-26 DIAGNOSIS — K219 Gastro-esophageal reflux disease without esophagitis: Secondary | ICD-10-CM | POA: Diagnosis not present

## 2021-03-26 DIAGNOSIS — S72141G Displaced intertrochanteric fracture of right femur, subsequent encounter for closed fracture with delayed healing: Secondary | ICD-10-CM | POA: Diagnosis not present

## 2021-03-26 DIAGNOSIS — I482 Chronic atrial fibrillation, unspecified: Secondary | ICD-10-CM | POA: Diagnosis not present

## 2021-03-26 DIAGNOSIS — I5082 Biventricular heart failure: Secondary | ICD-10-CM | POA: Diagnosis not present

## 2021-03-26 DIAGNOSIS — Z7901 Long term (current) use of anticoagulants: Secondary | ICD-10-CM

## 2021-03-26 DIAGNOSIS — R7401 Elevation of levels of liver transaminase levels: Secondary | ICD-10-CM | POA: Diagnosis not present

## 2021-03-26 DIAGNOSIS — I110 Hypertensive heart disease with heart failure: Secondary | ICD-10-CM | POA: Diagnosis not present

## 2021-03-26 DIAGNOSIS — E440 Moderate protein-calorie malnutrition: Secondary | ICD-10-CM | POA: Diagnosis not present

## 2021-03-26 DIAGNOSIS — N9489 Other specified conditions associated with female genital organs and menstrual cycle: Secondary | ICD-10-CM | POA: Diagnosis not present

## 2021-03-26 DIAGNOSIS — M96840 Postprocedural hematoma of a musculoskeletal structure following a musculoskeletal system procedure: Secondary | ICD-10-CM | POA: Diagnosis not present

## 2021-03-26 DIAGNOSIS — I10 Essential (primary) hypertension: Secondary | ICD-10-CM | POA: Diagnosis not present

## 2021-03-26 DIAGNOSIS — E785 Hyperlipidemia, unspecified: Secondary | ICD-10-CM | POA: Diagnosis not present

## 2021-03-26 DIAGNOSIS — K828 Other specified diseases of gallbladder: Secondary | ICD-10-CM | POA: Diagnosis not present

## 2021-03-26 DIAGNOSIS — I480 Paroxysmal atrial fibrillation: Secondary | ICD-10-CM | POA: Diagnosis not present

## 2021-03-26 LAB — CBC
HCT: 32.4 % — ABNORMAL LOW (ref 36.0–46.0)
Hemoglobin: 10.7 g/dL — ABNORMAL LOW (ref 12.0–15.0)
MCH: 31.4 pg (ref 26.0–34.0)
MCHC: 33 g/dL (ref 30.0–36.0)
MCV: 95 fL (ref 80.0–100.0)
Platelets: 119 10*3/uL — ABNORMAL LOW (ref 150–400)
RBC: 3.41 MIL/uL — ABNORMAL LOW (ref 3.87–5.11)
RDW: 13.8 % (ref 11.5–15.5)
WBC: 8.1 10*3/uL (ref 4.0–10.5)
nRBC: 0 % (ref 0.0–0.2)

## 2021-03-26 LAB — COMPREHENSIVE METABOLIC PANEL
ALT: 65 U/L — ABNORMAL HIGH (ref 0–44)
AST: 39 U/L (ref 15–41)
Albumin: 2.6 g/dL — ABNORMAL LOW (ref 3.5–5.0)
Alkaline Phosphatase: 116 U/L (ref 38–126)
Anion gap: 8 (ref 5–15)
BUN: 17 mg/dL (ref 8–23)
CO2: 31 mmol/L (ref 22–32)
Calcium: 7.9 mg/dL — ABNORMAL LOW (ref 8.9–10.3)
Chloride: 100 mmol/L (ref 98–111)
Creatinine, Ser: 0.67 mg/dL (ref 0.44–1.00)
GFR, Estimated: >60 mL/min (ref >60)
Glucose, Bld: 109 mg/dL — ABNORMAL HIGH (ref 70–99)
Potassium: 2.8 mmol/L — ABNORMAL LOW (ref 3.5–5.1)
Sodium: 139 mmol/L (ref 135–145)
Total Bilirubin: 0.9 mg/dL (ref 0.3–1.2)
Total Protein: 5.9 g/dL — ABNORMAL LOW (ref 6.5–8.1)

## 2021-03-26 LAB — RESP PANEL BY RT-PCR (FLU A&B, COVID) ARPGX2
Influenza A by PCR: NEGATIVE
Influenza B by PCR: NEGATIVE
SARS Coronavirus 2 by RT PCR: NEGATIVE

## 2021-03-26 LAB — PROCALCITONIN: Procalcitonin: 3.04 ng/mL

## 2021-03-26 MED ORDER — CEFDINIR 300 MG PO CAPS: 300.0000 mg | ORAL_CAPSULE | Freq: Two times a day (BID) | ORAL | 0 refills | Status: DC

## 2021-03-26 MED ORDER — WARFARIN SODIUM 3 MG PO TABS
ORAL_TABLET | ORAL | 2 refills | Status: DC
Start: 2021-03-26 — End: 2021-03-27

## 2021-03-26 MED ORDER — CEFDINIR 300 MG PO CAPS: 300.0000 mg | ORAL_CAPSULE | Freq: Two times a day (BID) | ORAL | Status: DC

## 2021-03-26 MED ORDER — AZITHROMYCIN 500 MG PO TABS: 500.0000 mg | ORAL_TABLET | Freq: Every day | ORAL | 0 refills | Status: DC

## 2021-03-26 MED ORDER — ASPIRIN EC 81 MG PO TBEC: 81.0000 mg | DELAYED_RELEASE_TABLET | Freq: Two times a day (BID) | ORAL | 0 refills | Status: DC

## 2021-03-26 MED ORDER — OXYCODONE HCL 5 MG PO TABS: 5.0000 mg | ORAL_TABLET | ORAL | 0 refills | Status: DC | PRN

## 2021-03-26 NOTE — Progress Notes (Signed)
   ORTHOPAEDIC PROGRESS NOTE  s/p Procedure(s): INTRAMEDULLARY (IM) NAIL INTERTROCHANTERIC; CMN for R IT fracture  DOS: 03/24/21  SUBJECTIVE: No issues over night.  Visiting with family this morning.  Worked with PT yesterday.  No issues over night.  OBJECTIVE: PE:  Alert and oriented, no acute distress  Right hip/thigh dressings are clean, dry and intact Tolerates gentle ROM of the hip Active motion intact to TA/EHL Sensation intact throughout the right foot Toes are WWP   Vitals:   03/26/21 0531 03/26/21 0839  BP: (!) 124/58 125/65  Pulse: 81 81  Resp: 14 16  Temp: 99.1 F (37.3 C) 98.9 F (37.2 C)  SpO2: 92% 97%   XR of the right hip demonstrates improved alignment with intramedullary rod fixation.  No acute injuries.  Implants well positioned.   ASSESSMENT: Norma Barajas is a 80 y.o. female doing well postoperatively.  PLAN: Weightbearing: WBAT RLE Insicional and dressing care: Reinforce dressings as needed Orthopedic device(s): None VTE prophylaxis: Resume Warfarin POD#1 at discretion of medicine team  Pain control: PO medications as needed.  Follow - up plan: 2 weeks; staples can be removed if DC to SNF.  Follow up in 6 weeks if no issues.    Contact information:     Aarionna Germer A. Amedeo Kinsman, MD [facility] [address] [place],  [place]  [address] Phone: [phone] Fax: [phone]

## 2021-03-26 NOTE — Discharge Summary (Addendum)
Physician Discharge Summary  Norma Barajas:[id number] DOB: 21-Aug-1941 DOA: 03/23/2021  PCP: Loman Brooklyn, FNP  Admit date: 03/23/2021 Discharge date: 03/26/2021  Admitted From: Home Disposition:  SNF  Recommendations for Outpatient Follow-up:  1. Follow up with PCP in 1-2 weeks 2. Please obtain BMP/CBC in one week 3. Repeat CT abd/pelvis in 1-2 weeks to re-evaluate right pelvic sidewall fluid collection-->if smaller, then can resume coumadin;  If larger, patient will need needle aspiration to r/o infection     Discharge Condition: Stable CODE STATUS: DNR Diet recommendation: Heart Healthy    Brief/Interim Summary: 80 y.o.femalewith medical history significant ofhypertension, hyperlipidemia, atrial fibrillation on anticoagulation, presents to the emergency room after falling trying to get up from her couch.  She reports that before the fall she did not experience any dizziness, chest pain, palpitations. She does not think she hit her head, and she knows she did not lose consciousness. Patient denies any fevers, cough, dysuria, generalized weakness. She reports she has had normal appetite. She denies any nausea vomiting or diarrhea. Patient does not remember exactly how she landed. She does note that her right hip was immediately pain and she is not able to get up on her own. She states she was on floor about 20-30 minutes before EMS arrived to pick her up.   In the ED Temperature 100.7, heart rate 78-1 04, respiratory rate 19-28, blood pressure 126/63, satting at 98% No leukocytosis Chemistry panel is mostly unremarkable Alk phos slightly elevated as expected at 205 Liver function is more elevated than normal with an AST of 1131, ALT of 491, T bili 2.7 Respiratory panel negative UA negative CT abdomen pelvis shows infected hematoma versus abscess, acute and comminuted intertrochanteric fracture involving the proximal right femur Chest x-ray shows no active  disease Ortho recommends no DVT prophylaxis, IV fluids, n.p.o. Admission requested for medical management  Discharge Diagnoses:  Right Intertrochanteric femur fracture -appreciate ortho -03/24/21--IM nail -PT>>SNF which pt currently refuses -judicious opioids -aspirin 81 mg bid x 28 days, but would discontinue if warfarin is restarted  Transaminasemia -likely due to hemodynamic changes/?hepatic congestion from RV dysfunction -RUQ US--GB sludge without thickening or pericholecystic fluid, hepatic cystic focus R-lobe -trending down -viral hepatitis serologies neg  Pelvic Sidewall fluid collection -noted on CT 03/23/21 -reviewed with radiology, Dr. Marlowe Aschoff may be hematoma from fall/injury as pt did not present with septic type picture and was clinically stable without antibiotics -repeat CT pelvis in 1-2 weeks-->>if smaller, then can resume coumadin;  If larger, patient will need needle aspiration to r/o infection  Chronic diastolic CHF/RV dysfunction -appears clinically euvolemic -restart home dose lasix -09/09/20 Echo -EchoEF 50-55%, no WMA, RV volume overload, mild-mod MR, severe TR -continue home torsemide dose -follows Dr. Percival Spanish  Chronic Atrial fibrillation -has been treated with amiodarone and DCCV and she had recurrent fib. She was to have another DCCV but she declined and wanted to pursue rate control with amiodarone.  -CHADS-VASc = 4 -holding warfarin due to pelvic sidewall hematoma-->resume warfarin if collection is smaller on repeat CT  Hypertension -Blood pressure currently controlled -Continue amlodipine and metoprolol  Hyperlipidemia -Hold statin for now due to elevated LFTs>>resume after d/c since they improved  Lobar pneumonia -ceftriaxone and azithromycin during hospitalization -bibasilar consolidation noted on 5/16 CT abd and scattered patchy opacities noted on CTA H&N -4 more days cefdinir and azithro after d/c    Discharge  Instructions   Allergies as of 03/26/2021      Reactions  Codeine Nausea And Vomiting   Doxycycline Other (See Comments)   Chest congestion   Triamterene-hctz Other (See Comments)   weakness   Atorvastatin Other (See Comments)   Myalgias   Crestor [rosuvastatin Calcium] Other (See Comments)   weakness   Morphine Nausea Only   Risedronate Sodium Other (See Comments)   ACTONEL - reflux      Medication List    TAKE these medications   acetaminophen 500 MG tablet Commonly known as: TYLENOL Take 1,000 mg by mouth every 6 (six) hours as needed for moderate pain or mild pain.   amLODipine 10 MG tablet Commonly known as: NORVASC TAKE 1 TABLET EVERY DAY   aspirin EC 81 MG tablet Take 1 tablet (81 mg total) by mouth 2 (two) times daily. Swallow whole. X 30 days.  Stop when warfarin is restarted   cholecalciferol 25 MCG (1000 UNIT) tablet Commonly known as: VITAMIN D3 Take 1,000 Units by mouth daily.   Fish Oil 1000 MG Caps Take 1,000 mg by mouth 2 (two) times daily.   ketoconazole 2 % shampoo Commonly known as: NIZORAL Apply 1 application topically 2 (two) times a week.   metoprolol tartrate 50 MG tablet Commonly known as: LOPRESSOR Take 50 mg by mouth in the morning and at bedtime.   multivitamin capsule Take 1 capsule by mouth daily.   omeprazole 20 MG capsule Commonly known as: PRILOSEC TAKE 1 CAPSULE EVERY DAY   oxyCODONE 5 MG immediate release tablet Commonly known as: Oxy IR/ROXICODONE Take 1 tablet (5 mg total) by mouth every 4 (four) hours as needed for moderate pain.   potassium chloride 10 MEQ tablet Commonly known as: KLOR-CON Take 10 mEq by mouth 2 (two) times daily.   simvastatin 20 MG tablet Commonly known as: ZOCOR TAKE 1 TABLET EVERY DAY What changed: when to take this   torsemide 20 MG tablet Commonly known as: DEMADEX Take 1 tablet (20 mg total) by mouth daily.   warfarin 3 MG tablet Commonly known as: COUMADIN Take as directed. If you  are unsure how to take this medication, talk to your nurse or doctor. Original instructions: Take 3 mg by mouth every day except Tuesdays - take 1.5 mg. DO NOT RESTART UNTIL REPEAT CT PELVIS DONE TO CONFIRM IMPROVING HEMATOMA What changed: additional instructions       Allergies  Allergen Reactions  . Codeine Nausea And Vomiting  . Doxycycline Other (See Comments)    Chest congestion  . Triamterene-Hctz Other (See Comments)    weakness  . Atorvastatin Other (See Comments)    Myalgias   . Crestor [Rosuvastatin Calcium] Other (See Comments)    weakness  . Morphine Nausea Only  . Risedronate Sodium Other (See Comments)    ACTONEL - reflux    Consultations:  Ortho   Procedures/Studies: CT Abdomen Pelvis Wo Contrast  Result Date: 03/23/2021 CLINICAL DATA:  Abdominal pain and fever. EXAM: CT ABDOMEN AND PELVIS WITHOUT CONTRAST TECHNIQUE: Multidetector CT imaging of the abdomen and pelvis was performed following the standard protocol without IV contrast. COMPARISON:  09/07/2020 FINDINGS: Lower chest: Small subpleural consolidation and atelectasis noted within both lung bases. Linear areas of scarring and bronchiectasis noted bilaterally. Hepatobiliary: There are 2 simple appearing cysts noted within the right hepatic lobe. Small amount of sludge noted layering within the gallbladder. No gallbladder wall thickening or pericholecystic fluid. No bile duct dilatation. Pancreas: Unremarkable. No pancreatic ductal dilatation or surrounding inflammatory changes. 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 This measures 6 mm, image 23/2. No suspicious pancreatic mass, inflammation or main duct dilatation. Adrenals/Urinary Tract: Adrenal glands are unremarkable. Kidneys are normal, without renal calculi, focal lesion, or hydronephrosis. Bladder is unremarkable. Stomach/Bowel: Unchanged right adrenal gland adenoma measuring 1.6 cm. Normal left adrenal gland. No kidney  stones identified bilaterally. No hydronephrosis. No ureteral calculi. Urinary bladder appears unremarkable. Vascular/Lymphatic: Stomach appears normal. The appendix is not confidently identified separate from the right lower quadrant bowel loops. There is no bowel wall thickening, inflammation, or distension. Reproductive: Previous hysterectomy.  No adnexal mass. Other: There is a thick wall, fluid attenuating mass along the right pelvic sidewall which measures 4.8 x 3.0 cm, image 61/2. This is a new finding when compared with previous exam. Musculoskeletal: Remote healed bilateral pubic rami fractures are identified. Acute and comminuted intertrochanteric fracture involving the proximal right femur is again noted. IMPRESSION: 1. There is a thick wall, fluid attenuating mass along the right pelvic sidewall which is a new finding when compared with previous exam. Primary differential considerations include infected hematoma versus abscess. 2. Acute and comminuted intertrochanteric fracture involving the proximal right femur is again noted. 3. Unchanged right adrenal gland adenoma. 4. Small subpleural consolidation and atelectasis noted within both lung bases. Electronically Signed   By: Kerby Moors M.D.   On: 03/23/2021 21:07   DG Chest 1 View  Result Date: 03/23/2021 CLINICAL DATA:  Fall with hip pain EXAM: CHEST  1 VIEW COMPARISON:  10/25/2020 FINDINGS: Linear scarring or atelectasis at the left base. No focal consolidation or effusion. Mild cardiomegaly with aortic atherosclerosis. No pneumothorax. IMPRESSION: No active disease.  Atelectasis or scar at the left base. Electronically Signed   By: Donavan Foil M.D.   On: 03/23/2021 17:06   DG Knee Complete 4 Views Right  Result Date: 03/23/2021 CLINICAL DATA:  Fall with hip pain EXAM: RIGHT KNEE - COMPLETE 4+ VIEW COMPARISON:  12/11/2018 FINDINGS: No acute fracture or malalignment. Status post surgical plate and multiple screw fixation of the proximal  tibia. Old proximal fibular fracture. No significant knee effusion IMPRESSION: No acute osseous abnormality. Electronically Signed   By: Donavan Foil M.D.   On: 03/23/2021 17:35   DG HIP OPERATIVE UNILAT WITH PELVIS RIGHT  Result Date: 03/24/2021 CLINICAL DATA:  Right femur fracture.  ORIF. EXAM: OPERATIVE RIGHT HIP (WITH PELVIS IF PERFORMED) 10 VIEWS TECHNIQUE: Fluoroscopic spot image(s) were submitted for interpretation post-operatively. COMPARISON:  Pelvic and right hip radiographs 03/23/2021 FINDINGS: C-arm fluoroscopy provided in the operating room. 1 minutes and 4 seconds of fluoroscopy. 9.48 mGy. Ten spot fluoroscopic images of the right femur are submitted, demonstrating the placement of a dynamic right hip screw and intramedullary nail. A single distal interlocking screws noted. There is improved alignment of the intertrochanteric right femur fracture. No complications identified. Underlying old fractures of the pubic rami are partially imaged. IMPRESSION: Intraoperative views during ORIF of the comminuted intertrochanteric right hip fracture. Electronically Signed   By: Richardean Sale M.D.   On: 03/24/2021 14:15   DG Hip Unilat With Pelvis 2-3 Views Right  Result Date: 03/23/2021 CLINICAL DATA:  Fall with hip fracture EXAM: DG HIP (WITH OR WITHOUT PELVIS) 2-3V RIGHT COMPARISON:  CT 09/07/2020 FINDINGS: SI joints are non widened. Pubic symphysis is intact. Chronic bilateral pubic rami fractures. Acute comminuted and displaced right intertrochanteric fracture. No femoral head dislocation IMPRESSION: 1. Acute comminuted right intertrochanteric fracture. 2. Chronic pubic rami fractures Electronically Signed   By: Madie Reno.D.  On: 03/23/2021 17:08   DG FEMUR PORT, MIN 2 VIEWS RIGHT  Result Date: 03/24/2021 CLINICAL DATA:  Postop right hip surgery EXAM: OPERATIVE right HIP (WITH PELVIS IF PERFORMED) 5 VIEWS TECHNIQUE: Fluoroscopic spot image(s) were submitted for interpretation  post-operatively. COMPARISON:  Hip radiograph 03/23/2021 FINDINGS: Three there has been intramedullary rod screw fixation for the comminuted right intertrochanteric femur fracture. Intact hardware without evidence of loosening. There is partially visualized fixation hardware in the proximal tibia. There is a chronic right inferior pubic ramus fracture. Expected soft tissue changes. Vascular calcifications. IMPRESSION: Interval ORIF of the intertrochanteric right femur fracture without evidence of immediate hardware complication. Electronically Signed   By: Maurine Simmering   On: 03/24/2021 14:21   US Abdomen Limited RUQ (LIVER/GB)  Result Date: 03/24/2021 CLINICAL DATA:  Elevated liver enzymes EXAM: ULTRASOUND ABDOMEN LIMITED RIGHT UPPER QUADRANT COMPARISON:  CT abdomen and pelvis Mar 23, 2021 FINDINGS: Gallbladder: There is sludge in the gallbladder. No well-defined gallstones. Tiny intermingled gallstones are difficult to exclude. No gallbladder wall thickening or pericholecystic fluid. No sonographic Murphy sign noted by sonographer. Common bile duct: Diameter: 5 mm. No intrahepatic or extrahepatic biliary duct dilatation. Liver: Mildly septated cystic mass in the right lobe of liver measuring 2.2 x 1.5 x 1.4 cm, also present on recent CT. Within normal limits in parenchymal echogenicity. Portal vein is patent on color Doppler imaging with normal direction of blood flow towards the liver. Other: None. IMPRESSION: 1. Layering sludge in gallbladder. Although focal gallstones are evident, tiny intermingled gallstones are difficult to exclude in this circumstance. No gallbladder wall thickening or pericholecystic fluid. 2. Mildly septated cystic focus in the right lobe of the liver measuring 2.2 x 1.5 x 1.4 cm. No other focal liver lesion appreciable by ultrasound. Electronically Signed   By: Lowella Grip III M.D.   On: 03/24/2021 12:42        Discharge Exam: Vitals:   03/26/21 0531 03/26/21 0839  BP:  (!) 124/58 125/65  Pulse: 81 81  Resp: 14 16  Temp: 99.1 F (37.3 C) 98.9 F (37.2 C)  SpO2: 92% 97%   Vitals:   03/25/21 1342 03/25/21 2022 03/26/21 0531 03/26/21 0839  BP: 104/61 112/67 (!) 124/58 125/65  Pulse: 67 68 81 81  Resp: 16 15 14 16   Temp: 98 F (36.7 C) 98.2 F (36.8 C) 99.1 F (37.3 C) 98.9 F (37.2 C)  TempSrc: Oral  Oral Oral  SpO2: 94% 93% 92% 97%  Weight:      Height:        General: Pt is alert, awake, not in acute distress Cardiovascular: IRRR, S1/S2 +, no rubs, no gallops Respiratory: bibasilar rales.  No wheeze Abdominal: Soft, NT, ND, bowel sounds + Extremities: no edema, no cyanosis   The results of significant diagnostics from this hospitalization (including imaging, microbiology, ancillary and laboratory) are listed below for reference.    Significant Diagnostic Studies: CT Abdomen Pelvis Wo Contrast  Result Date: 03/23/2021 CLINICAL DATA:  Abdominal pain and fever. EXAM: CT ABDOMEN AND PELVIS WITHOUT CONTRAST TECHNIQUE: Multidetector CT imaging of the abdomen and pelvis was performed following the standard protocol without IV contrast. COMPARISON:  09/07/2020 FINDINGS: Lower chest: Small subpleural consolidation and atelectasis noted within both lung bases. Linear areas of scarring and bronchiectasis noted bilaterally. Hepatobiliary: There are 2 simple appearing cysts noted within the right hepatic lobe. Small amount of sludge noted layering within the gallbladder. No gallbladder wall thickening or pericholecystic fluid. No bile duct dilatation.  Pancreas: Unremarkable. No pancreatic ductal dilatation or surrounding inflammatory changes. 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 This measures 6 mm, image 23/2. No suspicious pancreatic mass, inflammation or main duct dilatation. Adrenals/Urinary Tract: Adrenal glands are unremarkable. Kidneys are normal, without renal calculi, focal lesion, or hydronephrosis. Bladder is  unremarkable. Stomach/Bowel: Unchanged right adrenal gland adenoma measuring 1.6 cm. Normal left adrenal gland. No kidney stones identified bilaterally. No hydronephrosis. No ureteral calculi. Urinary bladder appears unremarkable. Vascular/Lymphatic: Stomach appears normal. The appendix is not confidently identified separate from the right lower quadrant bowel loops. There is no bowel wall thickening, inflammation, or distension. Reproductive: Previous hysterectomy.  No adnexal mass. Other: There is a thick wall, fluid attenuating mass along the right pelvic sidewall which measures 4.8 x 3.0 cm, image 61/2. This is a new finding when compared with previous exam. Musculoskeletal: Remote healed bilateral pubic rami fractures are identified. Acute and comminuted intertrochanteric fracture involving the proximal right femur is again noted. IMPRESSION: 1. There is a thick wall, fluid attenuating mass along the right pelvic sidewall which is a new finding when compared with previous exam. Primary differential considerations include infected hematoma versus abscess. 2. Acute and comminuted intertrochanteric fracture involving the proximal right femur is again noted. 3. Unchanged right adrenal gland adenoma. 4. Small subpleural consolidation and atelectasis noted within both lung bases. Electronically Signed   By: Kerby Moors M.D.   On: 03/23/2021 21:07   DG Chest 1 View  Result Date: 03/23/2021 CLINICAL DATA:  Fall with hip pain EXAM: CHEST  1 VIEW COMPARISON:  10/25/2020 FINDINGS: Linear scarring or atelectasis at the left base. No focal consolidation or effusion. Mild cardiomegaly with aortic atherosclerosis. No pneumothorax. IMPRESSION: No active disease.  Atelectasis or scar at the left base. Electronically Signed   By: Donavan Foil M.D.   On: 03/23/2021 17:06   DG Knee Complete 4 Views Right  Result Date: 03/23/2021 CLINICAL DATA:  Fall with hip pain EXAM: RIGHT KNEE - COMPLETE 4+ VIEW COMPARISON:   12/11/2018 FINDINGS: No acute fracture or malalignment. Status post surgical plate and multiple screw fixation of the proximal tibia. Old proximal fibular fracture. No significant knee effusion IMPRESSION: No acute osseous abnormality. Electronically Signed   By: Donavan Foil M.D.   On: 03/23/2021 17:35   DG HIP OPERATIVE UNILAT WITH PELVIS RIGHT  Result Date: 03/24/2021 CLINICAL DATA:  Right femur fracture.  ORIF. EXAM: OPERATIVE RIGHT HIP (WITH PELVIS IF PERFORMED) 10 VIEWS TECHNIQUE: Fluoroscopic spot image(s) were submitted for interpretation post-operatively. COMPARISON:  Pelvic and right hip radiographs 03/23/2021 FINDINGS: C-arm fluoroscopy provided in the operating room. 1 minutes and 4 seconds of fluoroscopy. 9.48 mGy. Ten spot fluoroscopic images of the right femur are submitted, demonstrating the placement of a dynamic right hip screw and intramedullary nail. A single distal interlocking screws noted. There is improved alignment of the intertrochanteric right femur fracture. No complications identified. Underlying old fractures of the pubic rami are partially imaged. IMPRESSION: Intraoperative views during ORIF of the comminuted intertrochanteric right hip fracture. Electronically Signed   By: Richardean Sale M.D.   On: 03/24/2021 14:15   DG Hip Unilat With Pelvis 2-3 Views Right  Result Date: 03/23/2021 CLINICAL DATA:  Fall with hip fracture EXAM: DG HIP (WITH OR WITHOUT PELVIS) 2-3V RIGHT COMPARISON:  CT 09/07/2020 FINDINGS: SI joints are non widened. Pubic symphysis is intact. Chronic bilateral pubic rami fractures. Acute comminuted and displaced right intertrochanteric fracture. No femoral head dislocation IMPRESSION: 1. Acute  comminuted right intertrochanteric fracture. 2. Chronic pubic rami fractures Electronically Signed   By: Donavan Foil M.D.   On: 03/23/2021 17:08   DG FEMUR PORT, MIN 2 VIEWS RIGHT  Result Date: 03/24/2021 CLINICAL DATA:  Postop right hip surgery EXAM: OPERATIVE  right HIP (WITH PELVIS IF PERFORMED) 5 VIEWS TECHNIQUE: Fluoroscopic spot image(s) were submitted for interpretation post-operatively. COMPARISON:  Hip radiograph 03/23/2021 FINDINGS: Three there has been intramedullary rod screw fixation for the comminuted right intertrochanteric femur fracture. Intact hardware without evidence of loosening. There is partially visualized fixation hardware in the proximal tibia. There is a chronic right inferior pubic ramus fracture. Expected soft tissue changes. Vascular calcifications. IMPRESSION: Interval ORIF of the intertrochanteric right femur fracture without evidence of immediate hardware complication. Electronically Signed   By: Maurine Simmering   On: 03/24/2021 14:21   US Abdomen Limited RUQ (LIVER/GB)  Result Date: 03/24/2021 CLINICAL DATA:  Elevated liver enzymes EXAM: ULTRASOUND ABDOMEN LIMITED RIGHT UPPER QUADRANT COMPARISON:  CT abdomen and pelvis Mar 23, 2021 FINDINGS: Gallbladder: There is sludge in the gallbladder. No well-defined gallstones. Tiny intermingled gallstones are difficult to exclude. No gallbladder wall thickening or pericholecystic fluid. No sonographic Murphy sign noted by sonographer. Common bile duct: Diameter: 5 mm. No intrahepatic or extrahepatic biliary duct dilatation. Liver: Mildly septated cystic mass in the right lobe of liver measuring 2.2 x 1.5 x 1.4 cm, also present on recent CT. Within normal limits in parenchymal echogenicity. Portal vein is patent on color Doppler imaging with normal direction of blood flow towards the liver. Other: None. IMPRESSION: 1. Layering sludge in gallbladder. Although focal gallstones are evident, tiny intermingled gallstones are difficult to exclude in this circumstance. No gallbladder wall thickening or pericholecystic fluid. 2. Mildly septated cystic focus in the right lobe of the liver measuring 2.2 x 1.5 x 1.4 cm. No other focal liver lesion appreciable by ultrasound. Electronically Signed   By: Lowella Grip III M.D.   On: 03/24/2021 12:42     Microbiology: Recent Results (from the past 240 hour(s))  Resp Panel by RT-PCR (Flu A&B, Covid) Urine, Clean Catch     Status: None   Collection Time: 03/23/21  3:28 PM   Specimen: Urine, Clean Catch; Nasopharyngeal(NP) swabs in vial transport medium  Result Value Ref Range Status   SARS Coronavirus 2 by RT PCR NEGATIVE NEGATIVE Final    Comment: (NOTE) SARS-CoV-2 target nucleic acids are NOT DETECTED.  The SARS-CoV-2 RNA is generally detectable in upper respiratory specimens during the acute phase of infection. The lowest concentration of SARS-CoV-2 viral copies this assay can detect is 138 copies/mL. A negative result does not preclude SARS-Cov-2 infection and should not be used as the sole basis for treatment or other patient management decisions. A negative result may occur with  improper specimen collection/handling, submission of specimen other than nasopharyngeal swab, presence of viral mutation(s) within the areas targeted by this assay, and inadequate number of viral copies(<138 copies/mL). A negative result must be combined with clinical observations, patient history, and epidemiological information. The expected result is Negative.  Fact Sheet for Patients:  EntrepreneurPulse.com.au  Fact Sheet for Healthcare Providers:  IncredibleEmployment.be  This test is no t yet approved or cleared by the [place] FDA and  has been authorized for detection and/or diagnosis of SARS-CoV-2 by FDA under an Emergency Use Authorization (EUA). This EUA will remain  in effect (meaning this test can be used) for the duration of the COVID-19 declaration under Section 564(b)(1) of  the Act, 21 U.S.C.section 360bbb-3(b)(1), unless the authorization is terminated  or revoked sooner.       Influenza A by PCR NEGATIVE NEGATIVE Final   Influenza B by PCR NEGATIVE NEGATIVE Final    Comment: (NOTE) The  Xpert Xpress SARS-CoV-2/FLU/RSV plus assay is intended as an aid in the diagnosis of influenza from Nasopharyngeal swab specimens and should not be used as a sole basis for treatment. Nasal washings and aspirates are unacceptable for Xpert Xpress SARS-CoV-2/FLU/RSV testing.  Fact Sheet for Patients: EntrepreneurPulse.com.au  Fact Sheet for Healthcare Providers: IncredibleEmployment.be  This test is not yet approved or cleared by the [place] FDA and has been authorized for detection and/or diagnosis of SARS-CoV-2 by FDA under an Emergency Use Authorization (EUA). This EUA will remain in effect (meaning this test can be used) for the duration of the COVID-19 declaration under Section 564(b)(1) of the Act, 21 U.S.C. section 360bbb-3(b)(1), unless the authorization is terminated or revoked.  Performed at [facility], [address] [place], [place] [address]   MRSA PCR Screening     Status: None   Collection Time: 03/24/21 10:00 AM   Specimen: Nasal Mucosa; Nasopharyngeal  Result Value Ref Range Status   MRSA by PCR NEGATIVE NEGATIVE Final    Comment:        The GeneXpert MRSA Assay (FDA approved for NASAL specimens only), is one component of a comprehensive MRSA colonization surveillance program. It is not intended to diagnose MRSA infection nor to guide or monitor treatment for MRSA infections. Performed at [facility], [address] [place],  [address]      Labs: Basic Metabolic Panel: Recent Labs  Lab 03/23/21 1629 03/24/21 0657 03/26/21 0619  NA 134* 139 139  K 3.4* 3.2* 2.8*  CL 96* 98 100  CO2 27 30 31   GLUCOSE 130* 127* 109*  BUN 21 19 17   CREATININE 0.90 0.79 0.67  CALCIUM 9.0 8.1* 7.9*  MG  --  2.0  --    Liver Function Tests: Recent Labs  Lab 03/23/21 1629 03/24/21 0657 03/26/21 0619  AST 1,131* 336* 39  ALT 491* 293* 65*  ALKPHOS 205* 158* 116  BILITOT 2.7* 3.2* 0.9  PROT 7.5 6.7 5.9*   ALBUMIN 3.8 3.1* 2.6*   No results for input(s): LIPASE, AMYLASE in the last 168 hours. No results for input(s): AMMONIA in the last 168 hours. CBC: Recent Labs  Lab 03/23/21 1629 03/24/21 0657 03/26/21 0619  WBC 10.7* 12.6* 8.1  NEUTROABS 9.6*  --   --   HGB 14.5 12.0 10.7*  HCT 41.5 36.2 32.4*  MCV 93.5 94.8 95.0  PLT 178 130* 119*   Cardiac Enzymes: No results for input(s): CKTOTAL, CKMB, CKMBINDEX, TROPONINI in the last 168 hours. BNP: Invalid input(s): POCBNP CBG: No results for input(s): GLUCAP in the last 168 hours.  Time coordinating discharge:  36 minutes  Signed:  Orson Eva, DO [facility] Pager: [phone] 03/26/2021, 10:06 AM

## 2021-03-26 NOTE — Progress Notes (Signed)
Physical Therapy Treatment Patient Details Name: Norma Barajas MRN: [id number] DOB: [birth year] Today's Date: 03/26/2021    History of Present Illness Norma Barajas  is a 80 y.o. female, s/p Cephalomedullary nail for Right intertrochanteric femur fracture on 03/24/21  with history of vertigo, paroxysmal atrial fibrillation, osteoporosis, mitral regurgitation, hyper cholesterolemia, GERD, hypertension, diastolic dysfunction, and more presents ED with a chief complaint of fall.  Patient reports that she was getting up off the couch when she fell down.  She does not know why she fell.  She reports that before the fall she did not experience any dizziness, chest pain, palpitations.  She does not think she hit her head, and she knows she did not lose consciousness.  Patient denies any fevers, cough, dysuria, generalized weakness.  She reports she has had normal appetite.  She denies any nausea vomiting or diarrhea.  Patient does not remember exactly how she landed.  She does note that her right hip was immediately pain and she is not able to get up on her own.  Patient has no other complaints at this time.    PT Comments    Patient requires min/mod assist to transfer to seated EOB with assist for RLE movement and for uprighting trunk. Patient demonstrates good sitting tolerance and sitting balance EOB. Patient requires min/mod assist to transfer to standing with RW along with cueing for sequencing and RW use. Patient with impaired standing balance despite RW use. She ambulates with slow, labored, hesitant steps at bedside to chair. Patient appears anxious with ambulation and is limited by pain and fatigue. Patient seated in chair at end of session with husband present. Patient will benefit from continued physical therapy in hospital and recommended venue below to increase strength, balance, endurance for safe ADLs and gait.    Follow Up Recommendations  SNF;Supervision for mobility/OOB;Supervision - Intermittent      Equipment Recommendations  None recommended by PT    Recommendations for Other Services       Precautions / Restrictions Precautions Precautions: Fall Restrictions RLE Weight Bearing: Weight bearing as tolerated    Mobility  Bed Mobility Overal bed mobility: Needs Assistance Bed Mobility: Supine to Sit     Supine to sit: Min assist;Mod assist     General bed mobility comments: increased time, labored movement, required assist for RLE movement and for uprighting trunk    Transfers Overall transfer level: Needs assistance Equipment used: Rolling walker (2 wheeled) Transfers: Sit to/from [facility] Sit to Stand: Min assist;Mod assist Stand pivot transfers: Min assist;Mod assist       General transfer comment: increased time, labored movement, required verbal cues for proper hand placement and sequencing  Ambulation/Gait Ambulation/Gait assistance: Min assist;Mod assist Gait Distance (Feet): 3 Feet Assistive device: Rolling walker (2 wheeled) Gait Pattern/deviations: Decreased step length - right;Decreased step length - left;Decreased stance time - right;Decreased stride length;Antalgic Gait velocity: decreased   General Gait Details: slow labored cadence with limited weightbearing on RLE due to increased pain, bilateral LE trembling, anxious   Stairs             Wheelchair Mobility    Modified Rankin (Stroke Patients Only)       Balance Overall balance assessment: Needs assistance Sitting-balance support: Feet supported;No upper extremity supported Sitting balance-Leahy Scale: Fair Sitting balance - Comments: fair/good seated at EOB   Standing balance support: During functional activity;Bilateral upper extremity supported Standing balance-Leahy Scale: Poor Standing balance comment: fair/poor using RW  Cognition Arousal/Alertness: Awake/alert Behavior During Therapy: WFL for tasks  assessed/performed Overall Cognitive Status: Within Functional Limits for tasks assessed                                        Exercises      General Comments        Pertinent Vitals/Pain Pain Assessment: Faces Faces Pain Scale: Hurts even more Pain Location: right hip with movement Pain Descriptors / Indicators: Sore;Grimacing;Guarding Pain Intervention(s): Limited activity within patient's tolerance;Monitored during session;Repositioned    Home Living                      Prior Function            PT Goals (current goals can now be found in the care plan section) Acute Rehab PT Goals Patient Stated Goal: return home with family to assist PT Goal Formulation: With patient Time For Goal Achievement: 04/08/21 Potential to Achieve Goals: Good Progress towards PT goals: Progressing toward goals    Frequency    Min 4X/week      PT Plan Current plan remains appropriate    Co-evaluation              AM-PAC PT "6 Clicks" Mobility   Outcome Measure  Help needed turning from your back to your side while in a flat bed without using bedrails?: A Little Help needed moving from lying on your back to sitting on the side of a flat bed without using bedrails?: A Lot Help needed moving to and from a bed to a chair (including a wheelchair)?: A Little Help needed standing up from a chair using your arms (e.g., wheelchair or bedside chair)?: A Little Help needed to walk in hospital room?: A Lot Help needed climbing 3-5 steps with a railing? : A Lot 6 Click Score: 15    End of Session Equipment Utilized During Treatment: Gait belt Activity Tolerance: Patient tolerated treatment well;Patient limited by fatigue;Patient limited by pain Patient left: in chair;with call bell/phone within reach;with chair alarm set;with family/visitor present Nurse Communication: Mobility status PT Visit Diagnosis: Unsteadiness on feet (R26.81);Other abnormalities of  gait and mobility (R26.89);Muscle weakness (generalized) (M62.81)     Time: 1610-9604 PT Time Calculation (min) (ACUTE ONLY): 14 min  Charges:  $Therapeutic Activity: 8-22 mins                     1:46 PM, 03/26/21 Mearl Latin PT, DPT Physical Therapist at [facility]

## 2021-03-26 NOTE — Care Management Important Message (Signed)
Important Message  Patient Details  Name: Norma Barajas MRN: [id number] Date of Birth: [birth year]   Medicare Important Message Given:  Yes     Tommy Medal 03/26/2021, 12:08 PM

## 2021-03-26 NOTE — NC FL2 (Signed)
[place] MEDICAID FL2 LEVEL OF CARE SCREENING TOOL     IDENTIFICATION  Patient Name: Norma Barajas Birthdate: 1941/07/31 Sex: female Admission Date (Current Location): 03/23/2021  [facility] and [place] Number:  [employer] and Address:  [place] [address] [place]      Provider Number: [id number]  Attending Physician Name and Address:  Orson Eva, MD  Relative Name and Phone Number:  Deryn Massengale - husband - [phone]    Current Level of Care: Hospital Recommended Level of Care: [place] Prior Approval Number:    Date Approved/Denied:   PASRR Number: [id number]  Discharge Plan: SNF    Current Diagnoses: Patient Active Problem List   Diagnosis Date Noted  . Lobar pneumonia ([place]) 03/26/2021  . Intertrochanteric fracture of femur ([place])   . Transaminitis   . Hip fracture ([place]) 03/23/2021  . Chronic diastolic HF (heart failure) ([place]) 03/15/2021  . Major depressive disorder, recurrent, moderate ([place]) 12/09/2020  . Fracture of ramus of right pubis with routine healing 12/09/2020  . Unintended weight loss 12/09/2020  . 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 ([place]) 10/23/2020  . Esophageal dysphagia   . RUQ abdominal pain   . Biliary colic   . Cholecystitis 09/07/2020  . Early satiety 07/15/2020  . Anorexia 07/15/2020  . Abdominal pain 07/15/2020  . Chronic anticoagulation 07/17/2019  . Difficulty sleeping 06/22/2019  . Atrial fibrillation, chronic ([place]) 02/09/2016  . Paroxysmal atrial fibrillation ([place]) 08/21/2015  . Vitamin D deficiency 11/27/2014  . Coronary atherosclerosis of native coronary artery 07/24/2013  . Pulmonary nodule 02/18/2011  . Hyperlipidemia 07/29/2008  . GAD (generalized anxiety disorder) 07/29/2008  . Essential hypertension 07/29/2008  . GERD 07/29/2008  . Osteoporosis 07/29/2008    Orientation RESPIRATION BLADDER Height & Weight      Self,Time,Situation  Normal External catheter Weight: 49.8 kg Height:  5\' 1"  (154.9 cm)  BEHAVIORAL SYMPTOMS/MOOD NEUROLOGICAL BOWEL NUTRITION STATUS      Continent Diet (See DC summary)  AMBULATORY STATUS COMMUNICATION OF NEEDS Skin   Extensive Assist Verbally Surgical wounds                       Personal Care Assistance Level of Assistance  Bathing,Feeding,Dressing Bathing Assistance: Limited assistance Feeding assistance: Independent Dressing Assistance: Limited assistance     Functional Limitations Info  Sight,Speech,Hearing Sight Info: Adequate Hearing Info: Adequate Speech Info: Adequate    SPECIAL CARE FACTORS FREQUENCY  PT (By licensed PT)     PT Frequency: 5 times a week              Contractures Contractures Info: Not present    Additional Factors Info  Code Status,Allergies Code Status Info: DNR Allergies Info: Codeine, doxycycline, triamterene,atorvastatin, crestor,morphine, risedronate sodium           Current Medications (03/26/2021):  This is the current hospital active medication list Current Facility-Administered Medications  Medication Dose Route Frequency Provider Last Rate Last Admin  . acetaminophen (TYLENOL) tablet 650 mg  650 mg Oral Q6H PRN Mordecai Rasmussen, MD       Or  . acetaminophen (TYLENOL) suppository 650 mg  650 mg Rectal Q6H PRN Mordecai Rasmussen, MD      . amLODipine (NORVASC) tablet 10 mg  10 mg Oral Daily Mordecai Rasmussen, MD   10 mg at 03/26/21 0845  . azithromycin (ZITHROMAX) tablet 500 mg  500 mg Oral Daily Tat, Shanon Brow,  MD   500 mg at 03/26/21 0844  . cefTRIAXone (ROCEPHIN) 1 g in sodium chloride 0.9 % 100 mL IVPB  1 g Intravenous Q24H Orson Eva, MD 200 mL/hr at 03/25/21 1906 1 g at 03/25/21 1906  . Chlorhexidine Gluconate Cloth 2 % PADS 6 each  6 each Topical Daily Barton Dubois, MD   6 each at 03/26/21 [phone]  . feeding supplement (ENSURE ENLIVE / ENSURE PLUS) liquid 237 mL  237 mL Oral BID BM Mordecai Rasmussen, MD   237 mL at  03/26/21 0846  . metoprolol tartrate (LOPRESSOR) tablet 50 mg  50 mg Oral BID Mordecai Rasmussen, MD   50 mg at 03/26/21 0845  . morphine 2 MG/ML injection 2 mg  2 mg Intravenous Q2H PRN Mordecai Rasmussen, MD      . multivitamin with minerals tablet 1 tablet  1 tablet Oral Daily Mordecai Rasmussen, MD   1 tablet at 03/26/21 0844  . ondansetron (ZOFRAN) tablet 4 mg  4 mg Oral Q6H PRN Mordecai Rasmussen, MD       Or  . ondansetron [facility]) injection 4 mg  4 mg Intravenous Q6H PRN Mordecai Rasmussen, MD      . oxyCODONE (Oxy IR/ROXICODONE) immediate release tablet 5 mg  5 mg Oral Q4H PRN Mordecai Rasmussen, MD   5 mg at 03/26/21 0844  . torsemide (DEMADEX) tablet 20 mg  20 mg Oral Daily Mordecai Rasmussen, MD   20 mg at 03/26/21 0845     Discharge Medications: Please see discharge summary for a list of discharge medications.  Relevant Imaging Results:  Relevant Lab Results:   Additional Information SS# [id number]  Boneta Lucks, RN

## 2021-03-26 NOTE — TOC Transition Note (Signed)
Transition of Care [facility]) - CM/SW Discharge Note   Patient Details  Name: AVERI KILTY MRN: [id number] Date of Birth: 06-Aug-1941  [facility] of [facility] CM/SW Contact:  Boneta Lucks, RN Phone Number: 03/26/2021, 12:35 PM   Clinical Narrative:   Patient medically ready for SNF, requesting [facility] accepted and started insurance auth. Auth was received, RN to call report.   Final next level of care: Skilled Nursing Facility Barriers to Discharge: Barriers Resolved   Patient Goals and CMS Choice Patient states their goals for this hospitalization and ongoing recovery are:: agreed to SNF. CMS Medicare.gov Compare Post Acute Care list provided to:: Patient Choice offered to / list presented to : Patient  Discharge Placement              Patient chooses bed at: [facility] Patient to be transferred to facility by: [facility] staff   Patient and family notified of of transfer: 03/26/21  Discharge Plan and [place]   Readmission Risk Interventions Readmission Risk Prevention Plan 03/26/2021 03/25/2021  Medication Screening - Complete  Transportation Screening - Complete  PCP or Specialist Appt within 5-7 Days Complete -  Home Care Screening Complete -  Medication Review (RN CM) Complete -  Some recent data might be hidden

## 2021-03-27 ENCOUNTER — Non-Acute Institutional Stay (SKILLED_NURSING_FACILITY): Payer: Medicare HMO | Admitting: Adult Health

## 2021-03-27 ENCOUNTER — Encounter: Payer: Self-pay | Admitting: Adult Health

## 2021-03-27 ENCOUNTER — Ambulatory Visit: Payer: Medicare HMO | Admitting: Family Medicine

## 2021-03-27 DIAGNOSIS — N9489 Other specified conditions associated with female genital organs and menstrual cycle: Secondary | ICD-10-CM | POA: Insufficient documentation

## 2021-03-27 DIAGNOSIS — K828 Other specified diseases of gallbladder: Secondary | ICD-10-CM | POA: Diagnosis not present

## 2021-03-27 DIAGNOSIS — E43 Unspecified severe protein-calorie malnutrition: Secondary | ICD-10-CM

## 2021-03-27 DIAGNOSIS — D62 Acute posthemorrhagic anemia: Secondary | ICD-10-CM

## 2021-03-27 DIAGNOSIS — E782 Mixed hyperlipidemia: Secondary | ICD-10-CM

## 2021-03-27 DIAGNOSIS — S72141G Displaced intertrochanteric fracture of right femur, subsequent encounter for closed fracture with delayed healing: Secondary | ICD-10-CM

## 2021-03-27 DIAGNOSIS — I110 Hypertensive heart disease with heart failure: Secondary | ICD-10-CM | POA: Diagnosis not present

## 2021-03-27 DIAGNOSIS — E440 Moderate protein-calorie malnutrition: Secondary | ICD-10-CM | POA: Insufficient documentation

## 2021-03-27 DIAGNOSIS — I5082 Biventricular heart failure: Secondary | ICD-10-CM | POA: Diagnosis not present

## 2021-03-27 DIAGNOSIS — I5032 Chronic diastolic (congestive) heart failure: Secondary | ICD-10-CM

## 2021-03-27 DIAGNOSIS — I700 Atherosclerosis of aorta: Secondary | ICD-10-CM

## 2021-03-27 DIAGNOSIS — E876 Hypokalemia: Secondary | ICD-10-CM

## 2021-03-27 DIAGNOSIS — R7401 Elevation of levels of liver transaminase levels: Secondary | ICD-10-CM

## 2021-03-27 DIAGNOSIS — K219 Gastro-esophageal reflux disease without esophagitis: Secondary | ICD-10-CM

## 2021-03-27 DIAGNOSIS — D696 Thrombocytopenia, unspecified: Secondary | ICD-10-CM

## 2021-03-27 DIAGNOSIS — I482 Chronic atrial fibrillation, unspecified: Secondary | ICD-10-CM | POA: Diagnosis not present

## 2021-03-27 DIAGNOSIS — E46 Unspecified protein-calorie malnutrition: Secondary | ICD-10-CM | POA: Insufficient documentation

## 2021-03-27 HISTORY — DX: Other specified conditions associated with female genital organs and menstrual cycle: N94.89

## 2021-03-27 NOTE — Progress Notes (Signed)
Location:  [place] Room Number: 151-P Place of Service:  SNF (31)   CODE STATUS: Full  Allergies  Allergen Reactions  . Codeine Nausea And Vomiting  . Doxycycline Other (See Comments)    Chest congestion  . Triamterene-Hctz Other (See Comments)    weakness  . Atorvastatin Other (See Comments)    Myalgias   . Crestor [Rosuvastatin Calcium] Other (See Comments)    weakness  . Morphine Nausea Only  . Risedronate Sodium Other (See Comments)    ACTONEL - reflux    Chief Complaint  Patient presents with  . Hospitalization Follow-up    Follow-up from recent hospital stay     HPI:  She is a 80 year old woman who has been hospitalized from 03-23-21 through 03-26-21. Her medical history includes: afib; hypertension; hyperlipidemia; GERD. She presented to the ED after having a fall off the couch and having right hip pain. She has had at least 2 falls in the past 6 months; had a pelvic fracture in January of this year.  1. Right intertrochanteric femur fracture. Was seen by orthopedics had IM nail done on 03-24-21. Has been placed on asa 81 mg twice daily for 28 days or when coumadin is restarted.   2. She has transaminasemia: this is likely due to hemodynamic changes. Hepatic congestion from RV dysfunction. She had a right upper quad ultrasound: which showed gall bladder sludge without thickening or pericholecystic fluid; right lobe focus hepatic cyst. Her liver enzymes are improving; and nearly normal. Her hepatitis panel was negative.   3. Pelvic sidewall fluid collection: was noted on ct 03-23-21; was reviewed with radiology: suspect may be hematoma from fall/injry as she did not present with septic picture; she is clinically stable without antibiotics. She will need a repeat ct of abdomen/pelvis in the next 1-2 week; if the area is smaller can restart coumadin; however; is it is larger she will need a needle aspiration to rule out infection.   4. Lobar  pneumonia: was treated with ceftriaxone and azithromycin during her hospitalization.   She is here for short term rehab with her goal to return back home. She denies any uncontrolled pain; her constipation is being managed with prn medications. She denies any changes in appetite; no insomnia. 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 Anxiety   . Arthritis    "right leg" (04/11/2015)  . BPPV (benign paroxysmal positional vertigo) 03/23/2016  . Cataract   . Coronary atherosclerosis of native coronary artery    a. Nonobstructive minimal CAD 10/2005.  Marland Kitchen Depression   . Diastolic dysfunction    Grade 1. Ejection fraction 60-65%.  Marland Kitchen Dysrhythmia    a fib  . Erosive esophagitis   . Essential hypertension   . GERD (gastroesophageal reflux disease)   . Grade III hemorrhoids   . History of hiatal hernia   . Hypercholesterolemia   . Internal hemorrhoids with complication 0/35/2481   OCT 2015 FLEX SIG/IH BANDING    . Migraine    "used to have them right bad; I don't now" (04/11/2015)  . MITRAL REGURGITATION 04/27/2010  Qualifier: Diagnosis of  By: [name], MD, [name]   . Osteoporosis   . Paroxysmal atrial fibrillation (HCC)   . PONV (postoperative nausea and vomiting)   . Vertigo     Past Surgical History:  Procedure Laterality Date  . BRAVO [place] STUDY  11/15/2012   Procedure: BRAVO [place];  Surgeon: [name], MD;  Location: [facility];  Service: Endoscopy;;  . CARDIOVERSION N/A 05/16/2020   Procedure: CARDIOVERSION;  Surgeon: Donato Heinz, MD;  Location:  [facility] ENDOSCOPY;  Service: Cardiovascular;  Laterality: N/A;  . CATARACT EXTRACTION W/ INTRAOCULAR LENS  IMPLANT, BILATERAL Bilateral   . COLONOSCOPY  2008   Dr. [name]: internal hemorrhoids   . DILATION AND CURETTAGE OF UTERUS    . ESOPHAGOGASTRODUODENOSCOPY (EGD) WITH ESOPHAGEAL DILATION  2001   Dr. [name]: erosive esophagitis, esophageal stricture, duodenitis, s/p Savary dilation  . ESOPHAGOGASTRODUODENOSCOPY (EGD) WITH ESOPHAGEAL DILATION  11/15/2012   [id number] web was found & MOST LIKELY CAUSE FOR DYAPHAGIA/Polyp was found in the gastric body and gastric fundus/ gastritis on bx  . EYE SURGERY Bilateral    "laser OR after cataract OR; cause I couldn't see"  . FLEXIBLE SIGMOIDOSCOPY N/A 08/22/2014   mild diverticulosis in sigmoid, moderate sized Grade 3 hemorrhoids s/p banding X 3.   Marland Kitchen FRACTURE SURGERY Right    below the knee - 2 bones broke has plates  . HEMORRHOID BANDING N/A 08/22/2014   Procedure: HEMORRHOID BANDING;  Surgeon: [name], MD;  Location: [facility];  Service: Endoscopy;  Laterality: N/A;  . HEMORRHOID SURGERY N/A 03/30/2018   Procedure: EXTENSIVE HEMORRHOIDECTOMY;  Surgeon: [name], MD;  Location: [facility] ORS;  Service: General;  Laterality: N/A;  . INTRAMEDULLARY (IM) NAIL INTERTROCHANTERIC Right 03/24/2021   Procedure: INTRAMEDULLARY (IM) NAIL INTERTROCHANTRIC;  Surgeon: Mordecai Rasmussen, MD;  Location: [facility] ORS;  Service: Orthopedics;  Laterality: Right;  . OPEN REDUCTION INTERNAL FIXATION (ORIF) TIBIA/FIBULA FRACTURE Right 2013   broke tibia and fibula after falling down stairs  . PROLAPSED UTERINE FIBROID LIGATION  2015  . TOTAL ABDOMINAL HYSTERECTOMY      Social History   Socioeconomic History  . Marital status: Married    Spouse name: george   . Number of children: 1  . Years of education: Not on file  . Highest education level: Not on file  Occupational History  . Occupation: Retired    Comment: Textile  Tobacco Use  . Smoking status:  Never Smoker  . Smokeless tobacco: Never Used  Vaping Use  . Vaping Use: Never used  Substance and Sexual Activity  . Alcohol use: No    Alcohol/week: 0.0 standard drinks  . Drug use: No  . Sexual activity: Not Currently    Birth control/protection: Surgical, Post-menopausal    Comment: hyst  Other Topics Concern  . Not on file  Social History Narrative   Married   No regular exercise   Social Determinants of Health   Financial Resource Strain: Not on file  Food Insecurity: Not on file  Transportation Needs: Not on file  Physical Activity: Not on file  Stress: Not on file  Social Connections: Not on file  Intimate Partner Violence: Not on file   Family History  Problem Relation Age of Onset  . Colon cancer Mother 2  . Heart disease Mother   . Osteoporosis Mother   . Hip fracture Mother   . Stroke Sister   . Diabetes Sister   . Osteoporosis Sister   .  Arthritis Sister   . Uterine cancer Sister   . Stroke Sister   . Heart disease Father   . Hyperlipidemia Brother   . Hypertension Brother   . Heart disease Brother   . Stroke Brother   . Heart disease Sister   . Dementia Sister   . Diabetes Son   . Stroke Son   . Heart attack Neg Hx       VITAL SIGNS BP 102/60   Pulse 76   Temp (!) 97 F (36.1 C)   Resp 20   Ht $R'5\' 1"'qP$  (1.549 m)   Wt 110 lb (49.9 kg)   SpO2 90%   BMI 20.78 kg/m   Outpatient Encounter Medications as of 03/27/2021  Medication Sig  . acetaminophen (TYLENOL) 500 MG tablet Take 1,000 mg by mouth every 6 (six) hours as needed for moderate pain or mild pain.  Marland Kitchen amLODipine (NORVASC) 10 MG tablet TAKE 1 TABLET EVERY DAY (Patient taking differently: Take 10 mg by mouth daily.)  . aspirin EC 81 MG tablet Take 1 tablet (81 mg total) by mouth 2 (two) times daily. Swallow whole. X 30 days.  Stop when warfarin is restarted  . cholecalciferol (VITAMIN D3) 25 MCG (1000 UT) tablet Take 1,000 Units by mouth daily.  . metoprolol tartrate (LOPRESSOR) 50 MG  tablet Take 50 mg by mouth in the morning and at bedtime.  . Multiple Vitamin (MULTIVITAMIN) capsule Take 1 capsule by mouth daily.  . NON FORMULARY Diet: _____ Regular,   ___x___ NAS, _______Consistent Carbohydrate,  _______NPO _____Other  . Omega-3 Fatty Acids (FISH OIL) 1000 MG CAPS Take 1,000 mg by mouth 2 (two) times daily.   Marland Kitchen omeprazole (PRILOSEC) 20 MG capsule TAKE 1 CAPSULE EVERY DAY (Patient taking differently: Take 20 mg by mouth daily.)  . oxyCODONE (OXY IR/ROXICODONE) 5 MG immediate release tablet Take 1 tablet (5 mg total) by mouth every 4 (four) hours as needed for moderate pain.  . potassium chloride (KLOR-CON) 10 MEQ tablet Take 10 mEq by mouth 2 (two) times daily.  . simvastatin (ZOCOR) 20 MG tablet TAKE 1 TABLET EVERY DAY (Patient taking differently: Take 20 mg by mouth at bedtime.)  . torsemide (DEMADEX) 20 MG tablet Take 1 tablet (20 mg total) by mouth daily.   No facility-administered encounter medications on file as of 03/27/2021.     SIGNIFICANT DIAGNOSTIC EXAMS  TODAY  03-23-21: right hip x-ray:  1. Acute comminuted right intertrochanteric fracture. 2. Chronic pubic rami fractures  03-23-21: chest x-ray:  Linear scarring or atelectasis at the left base. No focal consolidation or effusion. Mild cardiomegaly with aortic atherosclerosis. No pneumothorax.  03-23-21: ct of abdomen and pelvis:  1. There is a thick wall, fluid attenuating mass along the right pelvic sidewall which is a new finding when compared with previous exam. Primary differential considerations include infected hematoma versus abscess. 2. Acute and comminuted intertrochanteric fracture involving the proximal right femur is again noted. 3. Unchanged right adrenal gland adenoma. 4. Small subpleural consolidation and atelectasis noted within both lung bases.  03-24-21: abdominal ultrasound:  1. Layering sludge in gallbladder. Although focal gallstones are evident, tiny intermingled gallstones are  difficult to exclude in this circumstance. No gallbladder wall thickening or pericholecystic fluid. 2. Mildly septated cystic focus in the right lobe of the liver measuring 2.2 x 1.5 x 1.4 cm. No other focal liver lesion appreciable by ultrasound.  LABS REVIEWED TODAY;   03-23-21: wbc 10.7; hgb 14.5; hct 41.5; mcv 93.5 plt 178;  glucose 130; bun 21; creat 0.90; k+3.4; na++ 134; ca 9.0; GFR>60; ast 1131; alt 491; alk phos 205; total bili 2.7; albumin 3.8 03-24-21: wbc 12.6; hgb 12.0; hct 36.2; mcv 94.8 plt 130; glucose 127; bun 19; creat 0.79; k+ 3.2; na++ 139; ca 8.1; GFR >60; glucose 127; bun 19; creat 0.79; k+ 3.2; na++ 139; ca 8.1; ast 336; alt 293; alk phos 158; total bili 3.2; albumin 3.1; mag 2.0; hepatitis panel negative 03-26-21: wbc 8.1; hgb 10.7; hct 32.4; mcv 95.0 plt 119; glucose 109; bun 17; creat 0.67; k+ 2.8; na++ 139; ca 7.9; GFR>60; ast 36; alt 65; alk phos 116; total bili 0.9; albumin 2.6    Review of Systems  Constitutional: Negative for malaise/fatigue.  Respiratory: Negative for cough and shortness of breath.   Cardiovascular: Negative for chest pain, palpitations and leg swelling.  Gastrointestinal: Negative for abdominal pain, constipation and heartburn.  Musculoskeletal: Positive for joint pain. Negative for back pain and myalgias.       Right hip pain is managed   Skin: Negative.   Neurological: Negative for dizziness.  Psychiatric/Behavioral: The patient is not nervous/anxious.     Physical Exam Constitutional:      General: She is not in acute distress.    Appearance: She is well-developed. She is not diaphoretic.     Comments: thin  HENT:     Mouth/Throat:     Comments: Oral thrush  Neck:     Thyroid: No thyromegaly.  Cardiovascular:     Rate and Rhythm: Normal rate. Rhythm irregular.     Pulses: Normal pulses.     Heart sounds: Normal heart sounds.  Pulmonary:     Effort: Pulmonary effort is normal. No respiratory distress.     Breath sounds: Normal  breath sounds.  Abdominal:     General: Bowel sounds are normal. There is no distension.     Palpations: Abdomen is soft.     Tenderness: There is no abdominal tenderness.  Musculoskeletal:     Cervical back: Neck supple.     Right lower leg: No edema.     Left lower leg: No edema.     Comments: Is able to move all extremities Status post right hip IM nail 03-24-21  Lymphadenopathy:     Cervical: No cervical adenopathy.  Skin:    General: Skin is warm and dry.     Comments: Right hip incision line without signs of infection present.   Neurological:     Mental Status: She is alert and oriented to person, place, and time.        ASSESSMENT/ PLAN:  TODAY  1. Closed displaced intertrochanteric fracture of right femur with delayed health subsequent encounter: is stable will continue therapy as directed; will follow up with orthopedics as directed will continue oxycodone every 4 hours as needed through 04-01-21; will continue asa 81 mg twice daily for total 28 more days will monitor   2. 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 Atrial fibrillation, chronic: heart rate is stable will continue lopressor 50 mg twice daily for rate control. Is off coumadin until ct of abdomin and pelvis in 2 weeks.   4. Hypertensive heart disease with chronic diastolic congestive heart failure: b/p 102/60: is stable will continue norvasc 10 mg daily lopressor 50 mg twice daily   5. Gastroesophageal reflux  disease without esophagitis is stable will continue prilosec 20 mg daily   6. Mixed hyperlipidemia: is stable will continue zocor 20 mg daily with fish oil twice daily   7. transaminitis gall bladder sludge: her liver function is improving will continue to monitor her status.   8. Aortic atherosclerosis (cxr 03-23-21) will monitor   9.  Acute blood loss anemia: hgb dropped from 14.5 to her current hgb of 10.7; will monitor   10. Protein calorie malnutrition; severe: albumin 2.6 will start prostat 30 cc three times daily   11. Hypokalemia: k+ is 2.8; will continue k+ 10 meq twice daily and will repeat labs.   12. Thrombocytopenia: plt 119; is on asa 81 mg twice daily will monitor  13. Oral thrush: will begin nystatin 5 cc four times daily for one week.   14. Pelvic hematoma vs pelvic abscess: will hold coumadin at this time; will repeat CT on 04-08-21; if it is smaller will restart coumadin; if larger; will need a needle biopsy performed. Will monitor her status.    Time spent with patient: 45 minutes: goals of care; medications; therapy needs.    Ok Edwards NP [facility]  Contact [phone] Monday through Friday 8am- 5pm  After hours call [phone]

## 2021-03-30 ENCOUNTER — Other Ambulatory Visit (HOSPITAL_COMMUNITY)
Admission: RE | Admit: 2021-03-30 | Discharge: 2021-03-30 | Disposition: A | Discharge status: Home / Self Care | Payer: Medicare HMO | Source: Skilled Nursing Facility | Attending: Adult Health | Admitting: Adult Health

## 2021-03-30 DIAGNOSIS — M96840 Postprocedural hematoma of a musculoskeletal structure following a musculoskeletal system procedure: Secondary | ICD-10-CM | POA: Insufficient documentation

## 2021-03-30 LAB — COMPREHENSIVE METABOLIC PANEL
ALT: 20 U/L (ref 0–44)
AST: 17 U/L (ref 15–41)
Albumin: 2.6 g/dL — ABNORMAL LOW (ref 3.5–5.0)
Alkaline Phosphatase: 110 U/L (ref 38–126)
Anion gap: 8 (ref 5–15)
BUN: 23 mg/dL (ref 8–23)
CO2: 33 mmol/L — ABNORMAL HIGH (ref 22–32)
Calcium: 8.4 mg/dL — ABNORMAL LOW (ref 8.9–10.3)
Chloride: 97 mmol/L — ABNORMAL LOW (ref 98–111)
Creatinine, Ser: 0.79 mg/dL (ref 0.44–1.00)
GFR, Estimated: >60 mL/min (ref >60)
Glucose, Bld: 108 mg/dL — ABNORMAL HIGH (ref 70–99)
Potassium: 3.9 mmol/L (ref 3.5–5.1)
Sodium: 138 mmol/L (ref 135–145)
Total Bilirubin: 0.8 mg/dL (ref 0.3–1.2)
Total Protein: 6.1 g/dL — ABNORMAL LOW (ref 6.5–8.1)

## 2021-03-30 LAB — CBC
HCT: 33.5 % — ABNORMAL LOW (ref 36.0–46.0)
Hemoglobin: 10.9 g/dL — ABNORMAL LOW (ref 12.0–15.0)
MCH: 31.5 pg (ref 26.0–34.0)
MCHC: 32.5 g/dL (ref 30.0–36.0)
MCV: 96.8 fL (ref 80.0–100.0)
Platelets: 231 10*3/uL (ref 150–400)
RBC: 3.46 MIL/uL — ABNORMAL LOW (ref 3.87–5.11)
RDW: 13.6 % (ref 11.5–15.5)
WBC: 9.9 10*3/uL (ref 4.0–10.5)
nRBC: 0 % (ref 0.0–0.2)

## 2021-03-31 ENCOUNTER — Other Ambulatory Visit: Payer: Self-pay | Admitting: Adult Health

## 2021-03-31 MED ORDER — OXYCODONE HCL 5 MG PO TABS: 5.0000 mg | ORAL_TABLET | ORAL | 0 refills | Status: DC | PRN

## 2021-04-01 ENCOUNTER — Encounter: Payer: Self-pay | Admitting: Internal Medicine

## 2021-04-01 ENCOUNTER — Non-Acute Institutional Stay (SKILLED_NURSING_FACILITY): Payer: Medicare HMO | Admitting: Internal Medicine

## 2021-04-01 DIAGNOSIS — E440 Moderate protein-calorie malnutrition: Secondary | ICD-10-CM | POA: Diagnosis not present

## 2021-04-01 DIAGNOSIS — N9489 Other specified conditions associated with female genital organs and menstrual cycle: Secondary | ICD-10-CM

## 2021-04-01 DIAGNOSIS — I480 Paroxysmal atrial fibrillation: Secondary | ICD-10-CM

## 2021-04-01 DIAGNOSIS — J181 Lobar pneumonia, unspecified organism: Secondary | ICD-10-CM

## 2021-04-01 DIAGNOSIS — R7401 Elevation of levels of liver transaminase levels: Secondary | ICD-10-CM

## 2021-04-01 DIAGNOSIS — S72141G Displaced intertrochanteric fracture of right femur, subsequent encounter for closed fracture with delayed healing: Secondary | ICD-10-CM

## 2021-04-01 NOTE — Assessment & Plan Note (Signed)
5/23 resolved; ALT,AST & total bilirubin WNL Avoid hepatotoxic agents; manage diastolic CHF

## 2021-04-01 NOTE — Patient Instructions (Signed)
See assessment and plan under each diagnosis in the problem list and acutely for this visit 

## 2021-04-01 NOTE — Assessment & Plan Note (Addendum)
PT/OT @ SNF & WB as tolerated. Wound Care Nurse reports incisions remain stapled w/o cellulitis

## 2021-04-01 NOTE — Assessment & Plan Note (Signed)
Follow-up CT abdomen/pelvis 1-2 weeks.  If hematoma is smaller, warfarin can be resumed.  If progressive; aspiration would be indicated clinically to rule out superimposed infection.

## 2021-04-01 NOTE — Assessment & Plan Note (Signed)
Antibiotics completed ; afebrile

## 2021-04-01 NOTE — Progress Notes (Signed)
NURSING HOME LOCATION:  [facility] ROOM NUMBER: [id number]  CODE STATUS: Full Code  PCP: [name] FNP  This is a comprehensive admission note to this SNFperformed on this date less than 30 days from date of admission. Included are preadmission medical/surgical history; reconciled medication list; family history; social history and comprehensive review of systems.  Corrections and additions to the records were documented. Comprehensive physical exam was also performed. Additionally a clinical summary was entered for each active diagnosis pertinent to this admission in the Problem List to enhance continuity of care.  HPI: She was hospitalized 5/16 - 03/26/2021 presenting to the ER after falling attempting to rise from her couch.  There was no reported neurologic or cardiologic prodrome prior to the fall.  She noted immediate pain in the right hip and inability to rise from the floor.  She was on the floor 20-30 minutes before EMS arrived for transport. In the ED there were no clinically significant lab abnormalities with the exception of an AST of 1131, ALT of 491, and total bilirubin of 2.7.  CT of the abdomen/pelvis revealed an infected hematoma versus abscess, acute and comminuted intertrochanteric fracture involving the proximal right femur.  Warfarin was held and IM nailing was performed 5/17.  DVT prophylaxis was to be 81 mg of aspirin twice daily x28 days. The transaminasesmia was attributed to hepatic congestion from right ventricular dysfunction.  Right upper quadrant ultrasound revealed gallbladder sludge without thickening or pericholecystic fluid.  Hepatic cyst was documented in the right lobe.  Liver enzymes did trend down.  Viral hepatitis serologies were negative. The pelvic sidewall fluid collection was felt to be traumatic hematoma from the the fall .Repeat CT of the pelvis in 1-2 weeks is recommended.  If the hematoma were significantly smaller then warfarin could  be resumed.  If larger then needle aspiration to rule out superimposed infection would be indicated clinically. Course was complicated by lobar pneumonia documented as bibasilar consolidation noted on 5/16 CT of abdomen/pelvis as well as scattered patchy opacities noted on CT of the head & neck.  She received ceftriaxone and azithromycin during hospitalization with 4 additional days of cefdinir and azithromycin post discharge. Her chronic diastolic congestive heart failure was felt to be stable.  Recent ECHO in November 2021 had revealed an EF of 50-55%, mild-moderate MR, and severe TR.  Maintenance torsemide dose was continued. She has had CAF with CHADS-Vasc 4 for which she received amiodarone and DCCV for recurrent fib.  She was to have a follow-up DCCV but she declined this so the therapeutic goal was rate control via amiodarone. Postop SNF placement for PT/OT was recommended but initially refused.  Past medical and surgical history:  Includes history of vertigo, PAF, osteoporosis, mitral regurgitation, history of migraines, dyslipidemia, GERD with history of erosive esophagitis, essential hypertension, CAD, history of acoustic neuroma, and diastolic dysfunction grade 1. She has had an incredible number of procedures and surgeries.  These include cardioversion, D&C of the uterus, esophageal dilation x2, ORIF of tibia/fib fracture, and TAH.  Social history: Nondrinker; never smoked.  Family history: Extensive family history reviewed; it is noncontributory due to advanced age.   Review of systems: She is oriented but affect is flat.  She states she continues to have pain in the right lower extremity.  She did verify that there was no cardiac or neurologic prodrome prior to the fall.  She states that basically "legs sometimes do not want to go".  She admits to  some anxiety but actually laughed as she told me this.  Constitutional: No fever, significant weight change, fatigue  Eyes: No redness,  discharge, pain, vision change ENT/mouth: No nasal congestion, purulent discharge, earache, change in hearing, sore throat  Cardiovascular: No chest pain, palpitations, paroxysmal nocturnal dyspnea, claudication, edema  Respiratory: No cough, sputum production, hemoptysis, DOE, significant snoring, apnea Gastrointestinal: No heartburn, dysphagia, abdominal pain, nausea /vomiting, rectal bleeding, melena, change in bowels Genitourinary: No dysuria, hematuria, pyuria, incontinence, nocturia Dermatologic: No rash, pruritus, change in appearance of skin Neurologic: No dizziness, headache, syncope, seizures, numbness, tingling Psychiatric: No significant insomnia, anorexia Endocrine: No change in hair/skin/nails, excessive thirst, excessive hunger, excessive urination  Hematologic/lymphatic: No significant bruising, lymphadenopathy, abnormal bleeding Allergy/immunology: No itchy/watery eyes, significant sneezing, urticaria, angioedema  Physical exam:  Pertinent or positive findings: She appears somewhat thin and suboptimally nourished.  Hair is very fine.  She has an upper plate and lower partial.  The L corner of the mouth tends to sag slightly . The first heart sound is slightly split and there is a faint systolic murmur present.  Second heart sound is increased.  Breath sounds are somewhat bronchovesicular in quality at the bases with minor rales.  Abdomen is protuberant.  Pedal pulses are decreased.  She has interosseous wasting of the hands.  Bruising is noted over the right upper extremity distally.  General appearance: no acute distress, increased work of breathing is present.   Lymphatic: No lymphadenopathy about the head, neck, axilla. Eyes: No conjunctival inflammation or lid edema is present. There is no scleral icterus. Ears:  External ear exam shows no significant lesions or deformities.   Nose:  External nasal examination shows no deformity or inflammation. Nasal mucosa are pink and moist  without lesions, exudates Oral exam: Lips and gums are healthy appearing.There is no oropharyngeal erythema or exudate. Neck:  No thyromegaly, masses, tenderness noted.    Heart:  No gallop,  click, rub.  Lungs:  without wheezes, rhonchi,  rubs. Abdomen: Bowel sounds are normal.  Abdomen is soft and nontender with no organomegaly, hernias, masses. GU: Deferred  Extremities:  No cyanosis, clubbing, edema. Neurologic exam: Balance, Rhomberg, finger to nose testing could not be completed due to clinical state Skin: Warm & dry w/o tenting. No significant lesions or rash.  See clinical summary under each active problem in the Problem List with associated updated therapeutic plan

## 2021-04-01 NOTE — Assessment & Plan Note (Addendum)
Total protein 6.1/ albumin 2.6 Nutritionist to monitor @ SNF

## 2021-04-01 NOTE — Assessment & Plan Note (Signed)
03/24/2021 EKG revealed some atrial ectopy but no A. fib.  Warfarin discontinued because of sidewall hematoma sustained in a mechanical fall.  DVT prophylaxis with 81 mg aspirin twice daily x28 days.

## 2021-04-03 ENCOUNTER — Non-Acute Institutional Stay (SKILLED_NURSING_FACILITY): Payer: Medicare HMO | Admitting: Adult Health

## 2021-04-03 ENCOUNTER — Encounter: Payer: Self-pay | Admitting: Adult Health

## 2021-04-03 ENCOUNTER — Other Ambulatory Visit: Payer: Self-pay | Admitting: Adult Health

## 2021-04-03 DIAGNOSIS — S72141G Displaced intertrochanteric fracture of right femur, subsequent encounter for closed fracture with delayed healing: Secondary | ICD-10-CM

## 2021-04-03 MED ORDER — OXYCODONE HCL 5 MG PO TABS: 5.0000 mg | ORAL_TABLET | Freq: Every day | ORAL | 0 refills | Status: DC | PRN

## 2021-04-03 NOTE — Progress Notes (Signed)
Location:  [place] Room Number: 151-P Place of Service:  SNF (31)   CODE STATUS: Full  Allergies  Allergen Reactions  . Codeine Nausea And Vomiting  . Doxycycline Other (See Comments)    Chest congestion  . Triamterene-Hctz Other (See Comments)    weakness  . Atorvastatin Other (See Comments)    Myalgias   . Crestor [Rosuvastatin Calcium] Other (See Comments)    weakness  . Morphine Nausea Only  . Risedronate Sodium Other (See Comments)    ACTONEL - reflux    Chief Complaint  Patient presents with  . Medical Management of Chronic Issues    Pain management.    HPI:  She is status post right hip fracture with IM nail done. She is having increased pain with therapy which is inhibiting her ability to participate with therapy. She does state that the pain is less intense prior to therapy but is significant with therapy.    Past Medical History:  Diagnosis Date  . 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 Anxiety   . Arthritis    "right leg" (04/11/2015)  . BPPV (benign paroxysmal positional vertigo) 03/23/2016  . Cataract   . Coronary atherosclerosis of native coronary artery    a. Nonobstructive minimal CAD 10/2005.  Marland Kitchen Depression   . Diastolic dysfunction    Grade 1. Ejection fraction 60-65%.  Marland Kitchen Dysrhythmia    a fib  . Erosive esophagitis   . Essential hypertension   . GERD (gastroesophageal reflux disease)   . Grade III hemorrhoids   . History of hiatal hernia   . Hypercholesterolemia   . Internal hemorrhoids with complication 11/17/3157   OCT 2015 FLEX SIG/IH BANDING    . Migraine    "used to have them right bad; I don't now" (04/11/2015)  . MITRAL REGURGITATION 04/27/2010   Qualifier: Diagnosis of  By: [name], MD, [name]   . Osteoporosis   . Paroxysmal atrial fibrillation (HCC)   . PONV (postoperative nausea and vomiting)   . Vertigo     Past Surgical History:  Procedure Laterality Date  . BRAVO [place] STUDY   11/15/2012   Procedure: BRAVO [place];  Surgeon: [name], MD;  [facility];  Service: Endoscopy;;  . CARDIOVERSION N/A 05/16/2020   Procedure: CARDIOVERSION;  Surgeon: Donato Heinz, MD;  Location: [facility] ENDOSCOPY;  Service: Cardiovascular;  Laterality: N/A;  . CATARACT EXTRACTION W/ INTRAOCULAR LENS  IMPLANT, BILATERAL Bilateral   . COLONOSCOPY  2008   Dr. [name]: internal hemorrhoids   . DILATION AND CURETTAGE OF UTERUS    . ESOPHAGOGASTRODUODENOSCOPY (EGD) WITH ESOPHAGEAL DILATION  2001   Dr. [name]: erosive esophagitis, esophageal stricture, duodenitis, s/p Savary dilation  . ESOPHAGOGASTRODUODENOSCOPY (EGD) WITH ESOPHAGEAL DILATION  11/15/2012   [id number] web was found & MOST LIKELY CAUSE FOR DYAPHAGIA/Polyp was found in the gastric body and gastric fundus/ gastritis on bx  . EYE SURGERY Bilateral    "laser OR after cataract OR; cause I couldn't see"  . FLEXIBLE SIGMOIDOSCOPY N/A 08/22/2014   mild diverticulosis in sigmoid, moderate sized Grade 3 hemorrhoids s/p banding X 3.   Marland Kitchen FRACTURE SURGERY Right    below the knee - 2 bones broke has plates  . HEMORRHOID BANDING N/A 08/22/2014   Procedure: HEMORRHOID BANDING;  Surgeon: [name], MD;  [facility];  Service: Endoscopy;  Laterality: N/A;  . HEMORRHOID SURGERY N/A 03/30/2018   Procedure: EXTENSIVE  HEMORRHOIDECTOMY;  Surgeon: [name], MD;  Location: [facility] ORS;  Service: General;  Laterality: N/A;  . INTRAMEDULLARY (IM) NAIL INTERTROCHANTERIC Right 03/24/2021   Procedure: INTRAMEDULLARY (IM) NAIL INTERTROCHANTRIC;  Surgeon: Mordecai Rasmussen, MD;  Location: [facility] ORS;  Service: Orthopedics;  Laterality: Right;  . OPEN REDUCTION INTERNAL FIXATION (ORIF) TIBIA/FIBULA FRACTURE Right 2013   broke tibia and fibula after falling down stairs  . PROLAPSED UTERINE FIBROID LIGATION  2015  . TOTAL ABDOMINAL HYSTERECTOMY      Social History   Socioeconomic History  . Marital status: Married     Spouse name: george   . Number of children: 1  . Years of education: Not on file  . Highest education level: Not on file  Occupational History  . Occupation: Retired    Comment: Textile  Tobacco Use  . Smoking status: Never Smoker  . Smokeless tobacco: Never Used  Vaping Use  . Vaping Use: Never used  Substance and Sexual Activity  . Alcohol use: No    Alcohol/week: 0.0 standard drinks  . Drug use: No  . Sexual activity: Not Currently    Birth control/protection: Surgical, Post-menopausal    Comment: hyst  Other Topics Concern  . Not on file  Social History Narrative   Married   No regular exercise   Social Determinants of Health   Financial Resource Strain: Not on file  Food Insecurity: Not on file  Transportation Needs: Not on file  Physical Activity: Not on file  Stress: Not on file  Social Connections: Not on file  Intimate Partner Violence: Not on file   Family History  Problem Relation Age of Onset  . Colon cancer Mother 73  . Heart disease Mother   . Osteoporosis Mother   . Hip fracture Mother   . Stroke Sister   . Diabetes Sister   . Osteoporosis Sister   . Arthritis Sister   . Uterine cancer Sister   . Stroke Sister   . Heart disease Father   . Hyperlipidemia Brother   . Hypertension Brother   . Heart disease Brother   . Stroke Brother   . Heart disease Sister   . Dementia Sister   . Diabetes Son   . Stroke Son   . Heart attack Neg Hx       VITAL SIGNS BP (!) 95/58   Pulse 76   Temp 98.8 F (37.1 C)   Resp 20   Ht _0  (1.549 m)   SpO2 94%   BMI 20.78 kg/m   Outpatient Encounter Medications as of 04/03/2021  Medication Sig  . acetaminophen (TYLENOL) 500 MG tablet Take 1,000 mg by mouth every 6 (six) hours as needed for moderate pain or mild pain.  . Amino Acids-Protein Hydrolys (FEEDING SUPPLEMENT, PRO-STAT SUGAR FREE 64,) LIQD Take 30 mLs by mouth 3 (three) times daily with meals. 15-100 gram-kcal/30 mL; amt: 30 mL; oral Special  Instructions: for albumin 2.6 in Grape juice per resident request With Meals; 08:00 AM, 12:00 PM, 06:00 PM  . amLODipine (NORVASC) 10 MG tablet TAKE 1 TABLET EVERY DAY (Patient taking differently: Take 10 mg by mouth daily.)  . aspirin EC 81 MG tablet Take 1 tablet (81 mg total) by mouth 2 (two) times daily. Swallow whole. X 30 days.  Stop when warfarin is restarted  . Balsam [place]-Castor Oil (VENELEX) OINT Apply topically. Special Instructions: Apply to bilateral buttocks and sacrum every Shift; Day, Evening, Night  . cholecalciferol (VITAMIN D3) 25 MCG (  1000 UT) tablet Take 1,000 Units by mouth daily.  . metoprolol tartrate (LOPRESSOR) 50 MG tablet Take 50 mg by mouth in the morning and at bedtime.  . Multiple Vitamin (MULTIVITAMIN) capsule Take 1 capsule by mouth daily.  . NON FORMULARY Diet: _____ Regular,   ___x___ NAS, _______Consistent Carbohydrate,  _______NPO _____Other  . Omega-3 Fatty Acids (FISH OIL) 1000 MG CAPS Take 1,000 mg by mouth 2 (two) times daily.   Marland Kitchen omeprazole (PRILOSEC) 20 MG capsule TAKE 1 CAPSULE EVERY DAY (Patient taking differently: Take 20 mg by mouth daily.)  . oxyCODONE (OXY IR/ROXICODONE) 5 MG immediate release tablet Take 1 tablet (5 mg total) by mouth daily as needed for up to 7 days for moderate pain (give prior to therapy).  . potassium chloride (KLOR-CON) 10 MEQ tablet Take 10 mEq by mouth 2 (two) times daily.  . simvastatin (ZOCOR) 20 MG tablet TAKE 1 TABLET EVERY DAY (Patient taking differently: Take 20 mg by mouth at bedtime.)  . torsemide (DEMADEX) 20 MG tablet Take 1 tablet (20 mg total) by mouth daily.   No facility-administered encounter medications on file as of 04/03/2021.     SIGNIFICANT DIAGNOSTIC EXAMS   PREVIOUS   03-23-21: right hip x-ray:  1. Acute comminuted right intertrochanteric fracture. 2. Chronic pubic rami fractures  03-23-21: chest x-ray:  Linear scarring or atelectasis at the left base. No focal consolidation or effusion.  Mild cardiomegaly with aortic atherosclerosis. No pneumothorax.  03-23-21: ct of abdomen and pelvis:  1. There is a thick wall, fluid attenuating mass along the right pelvic sidewall which is a new finding when compared with previous exam. Primary differential considerations include infected hematoma versus abscess. 2. Acute and comminuted intertrochanteric fracture involving the proximal right femur is again noted. 3. Unchanged right adrenal gland adenoma. 4. Small subpleural consolidation and atelectasis noted within both lung bases.  03-24-21: abdominal ultrasound:  1. Layering sludge in gallbladder. Although focal gallstones are evident, tiny intermingled gallstones are difficult to exclude in this circumstance. No gallbladder wall thickening or pericholecystic fluid. 2. Mildly septated cystic focus in the right lobe of the liver measuring 2.2 x 1.5 x 1.4 cm. No other focal liver lesion appreciable by ultrasound.  NO NEW EXAMS.   LABS REVIEWED PREVIOUS    03-23-21: wbc 10.7; hgb 14.5; hct 41.5; mcv 93.5 plt 178; glucose 130; bun 21; creat 0.90; k+3.4; na++ 134; ca 9.0; GFR>60; ast 1131; alt 491; alk phos 205; total bili 2.7; albumin 3.8 03-24-21: wbc 12.6; hgb 12.0; hct 36.2; mcv 94.8 plt 130; glucose 127; bun 19; creat 0.79; k+ 3.2; na++ 139; ca 8.1; GFR >60; glucose 127; bun 19; creat 0.79; k+ 3.2; na++ 139; ca 8.1; ast 336; alt 293; alk phos 158; total bili 3.2; albumin 3.1; mag 2.0; hepatitis panel negative 03-26-21: wbc 8.1; hgb 10.7; hct 32.4; mcv 95.0 plt 119; glucose 109; bun 17; creat 0.67; k+ 2.8; na++ 139; ca 7.9; GFR>60; ast 36; alt 65; alk phos 116; total bili 0.9; albumin 2.6   NO NEW LABS.   Review of Systems  Constitutional: Negative for malaise/fatigue.  Respiratory: Negative for cough and shortness of breath.   Cardiovascular: Negative for chest pain, palpitations and leg swelling.  Gastrointestinal: Negative for abdominal pain, constipation and heartburn.   Musculoskeletal: Positive for joint pain. Negative for back pain and myalgias.       Right hip pain   Skin: Negative.   Neurological: Negative for dizziness.  Psychiatric/Behavioral: The patient is not nervous/anxious.  Physical Exam Constitutional:      General: She is not in acute distress.    Appearance: She is well-developed. She is not diaphoretic.     Comments: thin  Neck:     Thyroid: No thyromegaly.  Cardiovascular:     Rate and Rhythm: Normal rate and regular rhythm.     Pulses: Normal pulses.     Heart sounds: Normal heart sounds.  Pulmonary:     Effort: Pulmonary effort is normal. No respiratory distress.     Breath sounds: Normal breath sounds.  Abdominal:     General: Bowel sounds are normal. There is no distension.     Palpations: Abdomen is soft.     Tenderness: There is no abdominal tenderness.  Musculoskeletal:     Cervical back: Neck supple.     Right lower leg: No edema.     Left lower leg: No edema.     Comments: Is able to move all extremities Status post right hip IM nail 03-24-21  Lymphadenopathy:   Lymphadenopathy:     Cervical: No cervical adenopathy.  Skin:    General: Skin is warm and dry.  Neurological:     Mental Status: She is alert and oriented to person, place, and time.  Psychiatric:        Mood and Affect: Mood normal.     ASSESSMENT/ PLAN:  TODAY  1. Closed displaced intertrochanteric fracture of right femur with delayed healing subsequent encounter  For her pain will begin Tylenol cr 650 mg every 6 hours Oxycodone 5 mg daily prior to therapy through 04-10-21 Will monitor her status.    Ok Edwards NP [facility]  Contact [phone] Monday through Friday 8am- 5pm  After hours call [phone]

## 2021-04-08 ENCOUNTER — Encounter: Payer: Self-pay | Admitting: Adult Health

## 2021-04-08 ENCOUNTER — Non-Acute Institutional Stay (SKILLED_NURSING_FACILITY): Payer: Medicare HMO | Admitting: Adult Health

## 2021-04-08 ENCOUNTER — Other Ambulatory Visit: Payer: Self-pay | Admitting: Adult Health

## 2021-04-08 DIAGNOSIS — I5032 Chronic diastolic (congestive) heart failure: Secondary | ICD-10-CM

## 2021-04-08 DIAGNOSIS — I5082 Biventricular heart failure: Secondary | ICD-10-CM

## 2021-04-08 DIAGNOSIS — I110 Hypertensive heart disease with heart failure: Secondary | ICD-10-CM

## 2021-04-08 DIAGNOSIS — I482 Chronic atrial fibrillation, unspecified: Secondary | ICD-10-CM

## 2021-04-08 DIAGNOSIS — S72141G Displaced intertrochanteric fracture of right femur, subsequent encounter for closed fracture with delayed healing: Secondary | ICD-10-CM

## 2021-04-08 MED ORDER — AMLODIPINE BESYLATE 10 MG PO TABS: 1.0000 | ORAL_TABLET | Freq: Every day | ORAL | 0 refills | Status: DC

## 2021-04-08 MED ORDER — SIMVASTATIN 20 MG PO TABS: 20.0000 mg | ORAL_TABLET | Freq: Every day | ORAL | 1 refills | Status: DC

## 2021-04-08 MED ORDER — POTASSIUM CHLORIDE ER 10 MEQ PO TBCR: 10.0000 meq | EXTENDED_RELEASE_TABLET | Freq: Two times a day (BID) | ORAL | 0 refills | Status: DC

## 2021-04-08 MED ORDER — METOPROLOL TARTRATE 50 MG PO TABS: 50.0000 mg | ORAL_TABLET | Freq: Two times a day (BID) | ORAL | 0 refills | Status: DC

## 2021-04-08 MED ORDER — TORSEMIDE 20 MG PO TABS: 20.0000 mg | ORAL_TABLET | Freq: Every day | ORAL | 0 refills | Status: DC

## 2021-04-08 NOTE — Progress Notes (Signed)
Location:    [place] Room Number: 151-P Place of Service:  SNF (31)    CODE STATUS: Full Code   Allergies  Allergen Reactions  . Codeine Nausea And Vomiting  . Doxycycline Other (See Comments)    Chest congestion  . Triamterene-Hctz Other (See Comments)    weakness  . Atorvastatin Other (See Comments)    Myalgias   . Crestor [Rosuvastatin Calcium] Other (See Comments)    weakness  . Morphine Nausea Only  . Risedronate Sodium Other (See Comments)    ACTONEL - reflux    Chief Complaint  Patient presents with  . Discharge Note    Discharge from [facility]     HPI:  She is being discharged to home with home health. She will not need any dme. She will need her prescriptions written and will need to follow up with her medical provider. She had been hospitalized for Right intertrochanteric femur fracture. Was seen by orthopedics had IM nail done on 03-24-21.  Pelvic sidewall fluid collection; for which her coumadin has been placed on hold. This will need to be followed up by her PCP. She has participated in pt and ot to improve upon her level of independence with her adls. She is now ready to complete her therapy on a home health.    Past Medical History:  Diagnosis Date  . 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 Anxiety   . Arthritis    "right leg" (04/11/2015)  . BPPV (benign paroxysmal positional vertigo) 03/23/2016  . Cataract   . Coronary atherosclerosis of native coronary artery    a. Nonobstructive minimal CAD 10/2005.  Marland Kitchen Depression   . Diastolic dysfunction    Grade 1. Ejection fraction 60-65%.  Marland Kitchen Dysrhythmia    a fib  . Erosive esophagitis   . Essential hypertension   . GERD (gastroesophageal reflux disease)   . Grade III hemorrhoids   . History of hiatal hernia   . Hypercholesterolemia   . Internal hemorrhoids with complication 7/71/1657   OCT 2015 FLEX SIG/IH BANDING    . Migraine    "used to have them right  bad; I don't now" (04/11/2015)  . MITRAL REGURGITATION 04/27/2010   Qualifier: Diagnosis of  By: [name], MD, [name]   . Osteoporosis   . Paroxysmal atrial fibrillation (HCC)   . PONV (postoperative nausea and vomiting)   . Vertigo     Past Surgical History:  Procedure Laterality Date  . BRAVO [place] STUDY  11/15/2012   Procedure: BRAVO [place];  Surgeon: [name], MD;  [facility];  Service: Endoscopy;;  . CARDIOVERSION N/A 05/16/2020   Procedure: CARDIOVERSION;  Surgeon: Donato Heinz, MD;  Location: [facility] ENDOSCOPY;  Service: Cardiovascular;  Laterality: N/A;  . CATARACT EXTRACTION W/ INTRAOCULAR LENS  IMPLANT, BILATERAL Bilateral   . COLONOSCOPY  2008   Dr. [name]: internal hemorrhoids   . DILATION AND CURETTAGE OF UTERUS    . ESOPHAGOGASTRODUODENOSCOPY (EGD) WITH ESOPHAGEAL DILATION  2001   Dr. [name]: erosive esophagitis, esophageal stricture, duodenitis, s/p Savary dilation  . ESOPHAGOGASTRODUODENOSCOPY (EGD) WITH ESOPHAGEAL DILATION  11/15/2012   [id number] web was found & MOST LIKELY CAUSE FOR DYAPHAGIA/Polyp was found in the gastric body and gastric fundus/ gastritis on bx  . EYE SURGERY Bilateral    "laser OR after cataract OR; cause I couldn't see"  . FLEXIBLE SIGMOIDOSCOPY N/A 08/22/2014   mild diverticulosis in sigmoid,  moderate sized Grade 3 hemorrhoids s/p banding X 3.   Marland Kitchen FRACTURE SURGERY Right    below the knee - 2 bones broke has plates  . HEMORRHOID BANDING N/A 08/22/2014   Procedure: HEMORRHOID BANDING;  Surgeon: [name], MD;  [facility];  Service: Endoscopy;  Laterality: N/A;  . HEMORRHOID SURGERY N/A 03/30/2018   Procedure: EXTENSIVE HEMORRHOIDECTOMY;  Surgeon: [name], MD;  Location: [facility] ORS;  Service: General;  Laterality: N/A;  . INTRAMEDULLARY (IM) NAIL INTERTROCHANTERIC Right 03/24/2021   Procedure: INTRAMEDULLARY (IM) NAIL INTERTROCHANTRIC;  Surgeon: Mordecai Rasmussen, MD;  Location: [facility] ORS;  Service:  Orthopedics;  Laterality: Right;  . OPEN REDUCTION INTERNAL FIXATION (ORIF) TIBIA/FIBULA FRACTURE Right 2013   broke tibia and fibula after falling down stairs  . PROLAPSED UTERINE FIBROID LIGATION  2015  . TOTAL ABDOMINAL HYSTERECTOMY      Social History   Socioeconomic History  . Marital status: Married    Spouse name: george   . Number of children: 1  . Years of education: Not on file  . Highest education level: Not on file  Occupational History  . Occupation: Retired    Comment: Textile  Tobacco Use  . Smoking status: Never Smoker  . Smokeless tobacco: Never Used  Vaping Use  . Vaping Use: Never used  Substance and Sexual Activity  . Alcohol use: No    Alcohol/week: 0.0 standard drinks  . Drug use: No  . Sexual activity: Not Currently    Birth control/protection: Surgical, Post-menopausal    Comment: hyst  Other Topics Concern  . Not on file  Social History Narrative   Married   No regular exercise   Social Determinants of Health   Financial Resource Strain: Not on file  Food Insecurity: Not on file  Transportation Needs: Not on file  Physical Activity: Not on file  Stress: Not on file  Social Connections: Not on file  Intimate Partner Violence: Not on file   Family History  Problem Relation Age of Onset  . Colon cancer Mother 91  . Heart disease Mother   . Osteoporosis Mother   . Hip fracture Mother   . Stroke Sister   . Diabetes Sister   . Osteoporosis Sister   . Arthritis Sister   . Uterine cancer Sister   . Stroke Sister   . Heart disease Father   . Hyperlipidemia Brother   . Hypertension Brother   . Heart disease Brother   . Stroke Brother   . Heart disease Sister   . Dementia Sister   . Diabetes Son   . Stroke Son   . Heart attack Neg Hx     VITAL SIGNS BP 98/74   Pulse 77   Temp (!) 97 F (36.1 C)   Resp 16   SpO2 94%   Patient's Medications  New Prescriptions   No medications on file  Previous Medications   ACETAMINOPHEN  (TYLENOL) 650 MG CR TABLET    Take 650 mg by mouth every 6 (six) hours. Displaced intertrochanteric fracture of right femur   AMINO ACIDS-PROTEIN HYDROLYS (FEEDING SUPPLEMENT, PRO-STAT SUGAR FREE 64,) LIQD    Take 30 mLs by mouth 3 (three) times daily with meals. 15-100 gram-kcal/30 mL; amt: 30 mL; oral Special Instructions: for albumin 2.6 in Grape juice per resident request With Meals; 08:00 AM, 12:00 PM, 06:00 PM   AMLODIPINE (NORVASC) 10 MG TABLET    TAKE 1 TABLET EVERY DAY   ASPIRIN EC  81 MG TABLET    Take 1 tablet (81 mg total) by mouth 2 (two) times daily. Swallow whole. X 30 days.  Stop when warfarin is restarted   BALSAM PERU-CASTOR OIL (VENELEX) OINT    Apply topically. Special Instructions: Apply to bilateral buttocks and sacrum every Shift; Day, Evening, Night   CHOLECALCIFEROL (VITAMIN D3) 25 MCG (1000 UT) TABLET    Take 1,000 Units by mouth daily.   METOPROLOL TARTRATE (LOPRESSOR) 50 MG TABLET    Take 50 mg by mouth in the morning and at bedtime.   MULTIPLE VITAMIN (MULTIVITAMIN) CAPSULE    Take 1 capsule by mouth daily.   NON FORMULARY    Diet: _____ Regular,   ___x___ NAS, _______Consistent Carbohydrate,  _______NPO _____Other   OMEGA-3 FATTY ACIDS (FISH OIL) 1000 MG CAPS    Take 1,000 mg by mouth 2 (two) times daily.    OMEPRAZOLE (PRILOSEC) 20 MG CAPSULE    TAKE 1 CAPSULE EVERY DAY   OXYCODONE (OXY IR/ROXICODONE) 5 MG IMMEDIATE RELEASE TABLET    Take 1 tablet (5 mg total) by mouth daily as needed for up to 7 days for moderate pain (give prior to therapy).   POTASSIUM CHLORIDE (KLOR-CON) 10 MEQ TABLET    Take 10 mEq by mouth 2 (two) times daily.   SIMVASTATIN (ZOCOR) 20 MG TABLET    TAKE 1 TABLET EVERY DAY   TORSEMIDE (DEMADEX) 20 MG TABLET    Take 1 tablet (20 mg total) by mouth daily.  Modified Medications   No medications on file  Discontinued Medications   ACETAMINOPHEN (TYLENOL) 500 MG TABLET    Take 1,000 mg by mouth every 6 (six) hours as needed for moderate pain or  mild pain.     SIGNIFICANT DIAGNOSTIC EXAMS  PREVIOUS   03-23-21: right hip x-ray:  1. Acute comminuted right intertrochanteric fracture. 2. Chronic pubic rami fractures  03-23-21: chest x-ray:  Linear scarring or atelectasis at the left base. No focal consolidation or effusion. Mild cardiomegaly with aortic atherosclerosis. No pneumothorax.  03-23-21: ct of abdomen and pelvis:  1. There is a thick wall, fluid attenuating mass along the right pelvic sidewall which is a new finding when compared with previous exam. Primary differential considerations include infected hematoma versus abscess. 2. Acute and comminuted intertrochanteric fracture involving the proximal right femur is again noted. 3. Unchanged right adrenal gland adenoma. 4. Small subpleural consolidation and atelectasis noted within both lung bases.  03-24-21: abdominal ultrasound:  1. Layering sludge in gallbladder. Although focal gallstones are evident, tiny intermingled gallstones are difficult to exclude in this circumstance. No gallbladder wall thickening or pericholecystic fluid. 2. Mildly septated cystic focus in the right lobe of the liver measuring 2.2 x 1.5 x 1.4 cm. No other focal liver lesion appreciable by ultrasound.  NO NEW EXAMS.   LABS REVIEWED PREVIOUS    03-23-21: wbc 10.7; hgb 14.5; hct 41.5; mcv 93.5 plt 178; glucose 130; bun 21; creat 0.90; k+3.4; na++ 134; ca 9.0; GFR>60; ast 1131; alt 491; alk phos 205; total bili 2.7; albumin 3.8 03-24-21: wbc 12.6; hgb 12.0; hct 36.2; mcv 94.8 plt 130; glucose 127; bun 19; creat 0.79; k+ 3.2; na++ 139; ca 8.1; GFR >60; glucose 127; bun 19; creat 0.79; k+ 3.2; na++ 139; ca 8.1; ast 336; alt 293; alk phos 158; total bili 3.2; albumin 3.1; mag 2.0; hepatitis panel negative 03-26-21: wbc 8.1; hgb 10.7; hct 32.4; mcv 95.0 plt 119; glucose 109; bun 17; creat 0.67; k+ 2.8; na++  139; ca 7.9; GFR>60; ast 36; alt 65; alk phos 116; total bili 0.9; albumin 2.6   NO NEW LABS.    Review of Systems  Constitutional: Negative for malaise/fatigue.  Respiratory: Negative for cough and shortness of breath.   Cardiovascular: Negative for chest pain, palpitations and leg swelling.  Gastrointestinal: Negative for abdominal pain, constipation and heartburn.  Musculoskeletal: Negative for back pain, joint pain and myalgias.  Skin: Negative.   Neurological: Negative for dizziness.  Psychiatric/Behavioral: The patient is not nervous/anxious.     Physical Exam Constitutional:      General: She is not in acute distress.    Appearance: She is well-developed. She is not diaphoretic.     Comments: thin  Neck:     Thyroid: No thyromegaly.  Cardiovascular:     Rate and Rhythm: Normal rate and regular rhythm.     Pulses: Normal pulses.     Heart sounds: Normal heart sounds.  Pulmonary:     Effort: Pulmonary effort is normal. No respiratory distress.     Breath sounds: Normal breath sounds.  Abdominal:     General: Bowel sounds are normal. There is no distension.     Palpations: Abdomen is soft.     Tenderness: There is no abdominal tenderness.  Musculoskeletal:     Cervical back: Neck supple.     Right lower leg: No edema.     Left lower leg: No edema.     Comments:  Is able to move all extremities Status post right hip IM nail 03-24-21   Lymphadenopathy:     Cervical: No cervical adenopathy.  Skin:    General: Skin is warm and dry.  Neurological:     Mental Status: She is alert and oriented to person, place, and time.  Psychiatric:        Mood and Affect: Mood normal.       ASSESSMENT/ PLAN:  Patient is being discharged with the following home health services:  Pt/ot to evaluate and treat as indicated for gait balance strength adl training.   Patient is being discharged with the following durable medical equipment:  None needed   Patient has been advised to f/u with their PCP in 1-2 weeks to bring them up to date on their rehab stay.  Social services at  facility was responsible for arranging this appointment.  Pt was provided with a 30 day supply of prescriptions for medications and refills must be obtained from their PCP.  For controlled substances, a more limited supply may be provided adequate until PCP appointment only.  A 30 day supply of her prescription medications have been sent to [facility]  Time spent with patient 40 minutes: medications; home health needs; dme.    Ok Edwards NP [facility]  Contact [phone] Monday through Friday 8am- 5pm  After hours call [phone]

## 2021-04-14 ENCOUNTER — Ambulatory Visit (INDEPENDENT_AMBULATORY_CARE_PROVIDER_SITE_OTHER): Payer: Medicare HMO | Admitting: Nurse Practitioner

## 2021-04-14 ENCOUNTER — Ambulatory Visit (INDEPENDENT_AMBULATORY_CARE_PROVIDER_SITE_OTHER): Payer: Medicare HMO

## 2021-04-14 ENCOUNTER — Encounter: Payer: Self-pay | Admitting: Nurse Practitioner

## 2021-04-14 ENCOUNTER — Telehealth: Payer: Self-pay

## 2021-04-14 VITALS — BP 119/76 | HR 132 | Temp 98.1°F

## 2021-04-14 DIAGNOSIS — R059 Cough, unspecified: Secondary | ICD-10-CM

## 2021-04-14 MED ORDER — DM-GUAIFENESIN ER 30-600 MG PO TB12: 1.0000 | ORAL_TABLET | Freq: Two times a day (BID) | ORAL | 0 refills | Status: DC

## 2021-04-14 NOTE — Patient Instructions (Signed)

## 2021-04-14 NOTE — Assessment & Plan Note (Signed)
Unresolved cough 2 weeks nonstop.  Spouse reports cough is worse when patient is laying down.  No fever, chills, nausea or vomiting.  On assessment patient's bilateral lower lungs has diminished sounds a little rattling.  Completed chest x-ray to rule out pneumonia results pending.  Guaifenesin for cough and congestion. Education provided to patient with printed handouts given. Rx sent to pharmacy. Follow-up with worsening unresolved symptoms.

## 2021-04-14 NOTE — Progress Notes (Signed)
Acute Office Visit  Subjective:    Patient ID: Norma Barajas, female    DOB: 1941-02-18, 80 y.o.   MRN: [id number]  Chief Complaint  Patient presents with  . Cough    Patient  Cough This is a recurrent problem. Episode onset: For the past 2 weeks. The problem has been gradually worsening. The problem occurs constantly. The cough is non-productive. Associated symptoms include shortness of breath. Pertinent negatives include no chills, ear congestion, fever, rash or sore throat. The symptoms are aggravated by cold air.     Past Medical History:  Diagnosis Date  . 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 Anxiety   . Arthritis    "right leg" (04/11/2015)  . BPPV (benign paroxysmal positional vertigo) 03/23/2016  . Cataract   . Coronary atherosclerosis of native coronary artery    a. Nonobstructive minimal CAD 10/2005.  Marland Kitchen Depression   . Diastolic dysfunction    Grade 1. Ejection fraction 60-65%.  Marland Kitchen Dysrhythmia    a fib  . Erosive esophagitis   . Essential hypertension   . GERD (gastroesophageal reflux disease)   . Grade III hemorrhoids   . History of hiatal hernia   . Hypercholesterolemia   . Internal hemorrhoids with complication 05/08/7792   OCT 2015 FLEX SIG/IH BANDING    . Migraine    "used to have them right bad; I don't now" (04/11/2015)  . MITRAL REGURGITATION 04/27/2010   Qualifier: Diagnosis of  By: [name], MD, [name]   . Osteoporosis   . Paroxysmal atrial fibrillation (HCC)   . PONV (postoperative nausea and vomiting)   . Vertigo     Past Surgical History:  Procedure Laterality Date  . BRAVO [place] STUDY  11/15/2012   Procedure: BRAVO [place];  Surgeon: [name], MD;  Location: [facility];  Service: Endoscopy;;  . CARDIOVERSION N/A 05/16/2020   Procedure: CARDIOVERSION;  Surgeon: Donato Heinz, MD;  Location: [facility] ENDOSCOPY;  Service: Cardiovascular;  Laterality: N/A;  . CATARACT EXTRACTION W/ INTRAOCULAR LENS  IMPLANT, BILATERAL  Bilateral   . COLONOSCOPY  2008   Dr. [name]: internal hemorrhoids   . DILATION AND CURETTAGE OF UTERUS    . ESOPHAGOGASTRODUODENOSCOPY (EGD) WITH ESOPHAGEAL DILATION  2001   Dr. [name]: erosive esophagitis, esophageal stricture, duodenitis, s/p Savary dilation  . ESOPHAGOGASTRODUODENOSCOPY (EGD) WITH ESOPHAGEAL DILATION  11/15/2012   JQZ:ESPQZRAQTM web was found & MOST LIKELY CAUSE FOR DYAPHAGIA/Polyp was found in the gastric body and gastric fundus/ gastritis on bx  . EYE SURGERY Bilateral    "laser OR after cataract OR; cause I couldn't see"  . FLEXIBLE SIGMOIDOSCOPY N/A 08/22/2014   mild diverticulosis in sigmoid, moderate sized Grade 3 hemorrhoids s/p banding X 3.   Marland Kitchen FRACTURE SURGERY Right    below the knee - 2 bones broke has plates  . HEMORRHOID BANDING N/A 08/22/2014   Procedure: HEMORRHOID BANDING;  Surgeon: [name], MD;  Location: [facility];  Service: Endoscopy;  Laterality: N/A;  . HEMORRHOID SURGERY N/A 03/30/2018   Procedure: EXTENSIVE HEMORRHOIDECTOMY;  Surgeon: [name], MD;  Location: [facility] ORS;  Service: General;  Laterality: N/A;  . INTRAMEDULLARY (IM) NAIL INTERTROCHANTERIC Right 03/24/2021   Procedure: INTRAMEDULLARY (IM) NAIL INTERTROCHANTRIC;  Surgeon: Mordecai Rasmussen, MD;  Location: [facility] ORS;  Service: Orthopedics;  Laterality: Right;  . OPEN REDUCTION INTERNAL FIXATION (ORIF) TIBIA/FIBULA FRACTURE Right 2013   broke tibia and fibula after falling down stairs  .  PROLAPSED UTERINE FIBROID LIGATION  2015  . TOTAL ABDOMINAL HYSTERECTOMY      Family History  Problem Relation Age of Onset  . Colon cancer Mother 46  . Heart disease Mother   . Osteoporosis Mother   . Hip fracture Mother   . Stroke Sister   . Diabetes Sister   . Osteoporosis Sister   . Arthritis Sister   . Uterine cancer Sister   . Stroke Sister   . Heart disease Father   . Hyperlipidemia Brother   . Hypertension Brother   . Heart disease Brother   . Stroke Brother   . Heart  disease Sister   . Dementia Sister   . Diabetes Son   . Stroke Son   . Heart attack Neg Hx     Social History   Socioeconomic History  . Marital status: Married    Spouse name: george   . Number of children: 1  . Years of education: Not on file  . Highest education level: Not on file  Occupational History  . Occupation: Retired    Comment: Textile  Tobacco Use  . Smoking status: Never Smoker  . Smokeless tobacco: Never Used  Vaping Use  . Vaping Use: Never used  Substance and Sexual Activity  . Alcohol use: No    Alcohol/week: 0.0 standard drinks  . Drug use: No  . Sexual activity: Not Currently    Birth control/protection: Surgical, Post-menopausal    Comment: hyst  Other Topics Concern  . Not on file  Social History Narrative   Married   No regular exercise   Social Determinants of Health   Financial Resource Strain: Not on file  Food Insecurity: Not on file  Transportation Needs: Not on file  Physical Activity: Not on file  Stress: Not on file  Social Connections: Not on file  Intimate Partner Violence: Not on file    Outpatient Medications Prior to Visit  Medication Sig Dispense Refill  . acetaminophen (TYLENOL) 650 MG CR tablet Take 650 mg by mouth every 6 (six) hours. Displaced intertrochanteric fracture of right femur    . Amino Acids-Protein Hydrolys (FEEDING SUPPLEMENT, PRO-STAT SUGAR FREE 64,) LIQD Take 30 mLs by mouth 3 (three) times daily with meals. 15-100 gram-kcal/30 mL; amt: 30 mL; oral Special Instructions: for albumin 2.6 in Grape juice per resident request With Meals; 08:00 AM, 12:00 PM, 06:00 PM    . amLODipine (NORVASC) 10 MG tablet Take 1 tablet (10 mg total) by mouth daily. 30 tablet 0  . aspirin EC 81 MG tablet Take 1 tablet (81 mg total) by mouth 2 (two) times daily. Swallow whole. X 30 days.  Stop when warfarin is restarted 60 tablet 0  . Balsam [place]-Castor Oil (VENELEX) OINT Apply topically. Special Instructions: Apply to bilateral  buttocks and sacrum every Shift; Day, Evening, Night    . cholecalciferol (VITAMIN D3) 25 MCG (1000 UT) tablet Take 1,000 Units by mouth daily.    . metoprolol tartrate (LOPRESSOR) 50 MG tablet Take 1 tablet (50 mg total) by mouth in the morning and at bedtime. 60 tablet 0  . Multiple Vitamin (MULTIVITAMIN) capsule Take 1 capsule by mouth daily.    . NON FORMULARY Diet: _____ Regular,   ___x___ NAS, _______Consistent Carbohydrate,  _______NPO _____Other    . Omega-3 Fatty Acids (FISH OIL) 1000 MG CAPS Take 1,000 mg by mouth 2 (two) times daily.     Marland Kitchen omeprazole (PRILOSEC) 20 MG capsule TAKE 1 CAPSULE EVERY DAY 90  capsule 1  . potassium chloride (KLOR-CON) 10 MEQ tablet Take 1 tablet (10 mEq total) by mouth 2 (two) times daily. 60 tablet 0  . simvastatin (ZOCOR) 20 MG tablet Take 1 tablet (20 mg total) by mouth daily. 90 tablet 1  . torsemide (DEMADEX) 20 MG tablet Take 1 tablet (20 mg total) by mouth daily. 30 tablet 0   No facility-administered medications prior to visit.    Allergies  Allergen Reactions  . Codeine Nausea And Vomiting  . Doxycycline Other (See Comments)    Chest congestion  . Triamterene-Hctz Other (See Comments)    weakness  . Atorvastatin Other (See Comments)    Myalgias   . Crestor [Rosuvastatin Calcium] Other (See Comments)    weakness  . Morphine Nausea Only  . Risedronate Sodium Other (See Comments)    ACTONEL - reflux    Review of Systems  Constitutional: Negative for chills and fever.  HENT: Negative for sore throat.   Respiratory: Positive for cough and shortness of breath.   Gastrointestinal: Negative.  Negative for nausea and vomiting.  Skin: Negative for rash.  All other systems reviewed and are negative.      Objective:    Physical Exam Vitals and nursing note reviewed. Chaperone present: Spouse.  Constitutional:      Appearance: Normal appearance.  HENT:     Head: Normocephalic.     Nose: Nose normal.     Mouth/Throat:     Mouth:  Mucous membranes are moist.     Pharynx: Oropharynx is clear.  Eyes:     Conjunctiva/sclera: Conjunctivae normal.  Cardiovascular:     Rate and Rhythm: Regular rhythm. Tachycardia present.     Heart sounds: Normal heart sounds.  Pulmonary:     Breath sounds: Wheezing present.  Abdominal:     General: Bowel sounds are normal.  Skin:    Findings: No rash.  Neurological:     Mental Status: She is alert and oriented to person, place, and time.  Psychiatric:        Mood and Affect: Mood normal.        Behavior: Behavior normal.     BP 119/76   Pulse (!) 132   Temp 98.1 F (36.7 C) (Temporal)   SpO2 96%  Wt Readings from Last 3 Encounters:  03/27/21 110 lb (49.9 kg)  03/24/21 109 lb 12.6 oz (49.8 kg)  03/18/21 110 lb (49.9 kg)    Health Maintenance Due  Topic Date Due  . Pneumococcal Vaccine 23-49 Years old (1 of 4 - PCV13) Never done  . Zoster Vaccines- Shingrix (1 of 2) Never done    There are no preventive care reminders to display for this patient.   Lab Results  Component Value Date   TSH 0.805 05/20/2020   Lab Results  Component Value Date   WBC 9.9 03/30/2021   HGB 10.9 (L) 03/30/2021   HCT 33.5 (L) 03/30/2021   MCV 96.8 03/30/2021   PLT 231 03/30/2021   Lab Results  Component Value Date   NA 138 03/30/2021   K 3.9 03/30/2021   CO2 33 (H) 03/30/2021   GLUCOSE 108 (H) 03/30/2021   BUN 23 03/30/2021   CREATININE 0.79 03/30/2021   BILITOT 0.8 03/30/2021   ALKPHOS 110 03/30/2021   AST 17 03/30/2021   ALT 20 03/30/2021   PROT 6.1 (L) 03/30/2021   ALBUMIN 2.6 (L) 03/30/2021   CALCIUM 8.4 (L) 03/30/2021   ANIONGAP 8 03/30/2021   EGFR 77  01/22/2021   Lab Results  Component Value Date   CHOL 164 01/22/2021   Lab Results  Component Value Date   HDL 49 01/22/2021   Lab Results  Component Value Date   LDLCALC 90 01/22/2021   Lab Results  Component Value Date   TRIG 142 01/22/2021   Lab Results  Component Value Date   CHOLHDL 3.3 01/22/2021    Lab Results  Component Value Date   HGBA1C 5.6 02/28/2017       Assessment & Plan:   Problem List Items Addressed This Visit      Other   Cough - Primary    Unresolved cough 2 weeks nonstop.  Spouse reports cough is worse when patient is laying down.  No fever, chills, nausea or vomiting.  On assessment patient's bilateral lower lungs has diminished sounds a little rattling.  Completed chest x-ray to rule out pneumonia results pending.  Guaifenesin for cough and congestion. Education provided to patient with printed handouts given. Rx sent to pharmacy. Follow-up with worsening unresolved symptoms.      Relevant Medications   dextromethorphan-guaiFENesin (MUCINEX DM) 30-600 MG 12hr tablet   Other Relevant Orders   DG Chest 2 View       Meds ordered this encounter  Medications  . dextromethorphan-guaiFENesin (MUCINEX DM) 30-600 MG 12hr tablet    Sig: Take 1 tablet by mouth 2 (two) times daily.    Dispense:  30 tablet    Refill:  0    Order Specific Question:   Supervising Provider    Answer:   Janora Norlander [[id number]]     Ivy Lynn, NP

## 2021-04-14 NOTE — Telephone Encounter (Signed)
Transition Care Management Follow-up Telephone Call  Date of discharge and from where: 04/10/21 [place]  Diagnosis: right femur/hip fracture  How have you been since you were released from the hospital? She is getting around pretty well with a walker - not much of an appetite, but has congestion, which she is going to see Onyeje today for  Any questions or concerns? No  Items Reviewed:  Did the pt receive and understand the discharge instructions provided? Yes   Medications obtained and verified? Yes   Other? No   Any new allergies since your discharge? No   Dietary orders reviewed? Yes  Do you have support at home? Yes   Home Care and Equipment/Supplies: Were home health services ordered? yes If so, what is the name of the agency? [facility]  Has the agency set up a time to come to the patient's home? no Were any new equipment or medical supplies ordered?  No - she already had everything she needed - wheelchair, walker, cane, ramp out front, grab bars, etc  Functional Questionnaire: (I = Independent and D = Dependent) ADLs: I  Bathing/Dressing- I  Meal Prep- I  Eating- I  Maintaining continence- I  Transferring/Ambulation- I  Managing Meds- I  Follow up appointments reviewed:   PCP Hospital f/u appt confirmed? Yes  Scheduled to see [name] on 04/24/21 @ 3:50.  [facility] f/u appt confirmed? Yes  Scheduled to see Cairnes on 05/08/21 @ 10/40.  Are transportation arrangements needed? No   If their condition worsens, is the pt aware to call PCP or go to the Emergency Dept.? Yes  Was the patient provided with contact information for the PCP's office or ED? Yes  Was to pt encouraged to call back with questions or concerns? Yes

## 2021-04-15 ENCOUNTER — Telehealth: Payer: Self-pay | Admitting: Family Medicine

## 2021-04-15 ENCOUNTER — Telehealth: Payer: Self-pay | Admitting: Nurse Practitioner

## 2021-04-15 DIAGNOSIS — R059 Cough, unspecified: Secondary | ICD-10-CM

## 2021-04-15 MED ORDER — DM-GUAIFENESIN ER 30-600 MG PO TB12: 1.0000 | ORAL_TABLET | Freq: Two times a day (BID) | ORAL | 0 refills | Status: DC

## 2021-04-15 NOTE — Telephone Encounter (Signed)
Correction made in another encounter

## 2021-04-15 NOTE — Telephone Encounter (Signed)
Aware medication changed to [facility]

## 2021-04-17 ENCOUNTER — Other Ambulatory Visit (HOSPITAL_COMMUNITY): Payer: Self-pay | Admitting: Nurse Practitioner

## 2021-04-17 ENCOUNTER — Telehealth: Payer: Self-pay | Admitting: *Deleted

## 2021-04-17 DIAGNOSIS — M1711 Unilateral primary osteoarthritis, right knee: Secondary | ICD-10-CM | POA: Diagnosis not present

## 2021-04-17 DIAGNOSIS — D649 Anemia, unspecified: Secondary | ICD-10-CM | POA: Diagnosis not present

## 2021-04-17 DIAGNOSIS — I110 Hypertensive heart disease with heart failure: Secondary | ICD-10-CM | POA: Diagnosis not present

## 2021-04-17 DIAGNOSIS — I2510 Atherosclerotic heart disease of native coronary artery without angina pectoris: Secondary | ICD-10-CM | POA: Diagnosis not present

## 2021-04-17 DIAGNOSIS — I509 Heart failure, unspecified: Secondary | ICD-10-CM | POA: Diagnosis not present

## 2021-04-17 DIAGNOSIS — I051 Rheumatic mitral insufficiency: Secondary | ICD-10-CM | POA: Diagnosis not present

## 2021-04-17 DIAGNOSIS — M19071 Primary osteoarthritis, right ankle and foot: Secondary | ICD-10-CM | POA: Diagnosis not present

## 2021-04-17 DIAGNOSIS — I480 Paroxysmal atrial fibrillation: Secondary | ICD-10-CM | POA: Diagnosis not present

## 2021-04-17 DIAGNOSIS — S72141G Displaced intertrochanteric fracture of right femur, subsequent encounter for closed fracture with delayed healing: Secondary | ICD-10-CM | POA: Diagnosis not present

## 2021-04-17 NOTE — Telephone Encounter (Signed)
She should DC simvastatin. Karsten Fells can give her something safer next time she sees her.

## 2021-04-17 NOTE — Telephone Encounter (Signed)
Patient husband aware and verbalized understanding.

## 2021-04-17 NOTE — Telephone Encounter (Signed)
TC from [place] PT w/ [facility] RE: a level 1 interaction between amiodarone & simvastatin Pt just started taking the amiodarone yesterday,(not on current med list) she had some at the house Today she is feeling 'under the weather' not good, little congested, otherwise vitals are good

## 2021-04-21 DIAGNOSIS — I480 Paroxysmal atrial fibrillation: Secondary | ICD-10-CM | POA: Diagnosis not present

## 2021-04-21 DIAGNOSIS — S72141G Displaced intertrochanteric fracture of right femur, subsequent encounter for closed fracture with delayed healing: Secondary | ICD-10-CM | POA: Diagnosis not present

## 2021-04-21 DIAGNOSIS — M19071 Primary osteoarthritis, right ankle and foot: Secondary | ICD-10-CM | POA: Diagnosis not present

## 2021-04-21 DIAGNOSIS — I2510 Atherosclerotic heart disease of native coronary artery without angina pectoris: Secondary | ICD-10-CM | POA: Diagnosis not present

## 2021-04-21 DIAGNOSIS — I509 Heart failure, unspecified: Secondary | ICD-10-CM | POA: Diagnosis not present

## 2021-04-21 DIAGNOSIS — M1711 Unilateral primary osteoarthritis, right knee: Secondary | ICD-10-CM | POA: Diagnosis not present

## 2021-04-21 DIAGNOSIS — I110 Hypertensive heart disease with heart failure: Secondary | ICD-10-CM | POA: Diagnosis not present

## 2021-04-21 DIAGNOSIS — D649 Anemia, unspecified: Secondary | ICD-10-CM | POA: Diagnosis not present

## 2021-04-21 DIAGNOSIS — I051 Rheumatic mitral insufficiency: Secondary | ICD-10-CM | POA: Diagnosis not present

## 2021-04-22 ENCOUNTER — Other Ambulatory Visit (HOSPITAL_COMMUNITY): Payer: Self-pay | Admitting: Nurse Practitioner

## 2021-04-22 DIAGNOSIS — I2510 Atherosclerotic heart disease of native coronary artery without angina pectoris: Secondary | ICD-10-CM | POA: Diagnosis not present

## 2021-04-22 DIAGNOSIS — I110 Hypertensive heart disease with heart failure: Secondary | ICD-10-CM | POA: Diagnosis not present

## 2021-04-22 DIAGNOSIS — S72141G Displaced intertrochanteric fracture of right femur, subsequent encounter for closed fracture with delayed healing: Secondary | ICD-10-CM | POA: Diagnosis not present

## 2021-04-22 DIAGNOSIS — M1711 Unilateral primary osteoarthritis, right knee: Secondary | ICD-10-CM | POA: Diagnosis not present

## 2021-04-22 DIAGNOSIS — M19071 Primary osteoarthritis, right ankle and foot: Secondary | ICD-10-CM | POA: Diagnosis not present

## 2021-04-22 DIAGNOSIS — I480 Paroxysmal atrial fibrillation: Secondary | ICD-10-CM | POA: Diagnosis not present

## 2021-04-22 DIAGNOSIS — D649 Anemia, unspecified: Secondary | ICD-10-CM | POA: Diagnosis not present

## 2021-04-22 DIAGNOSIS — I509 Heart failure, unspecified: Secondary | ICD-10-CM | POA: Diagnosis not present

## 2021-04-22 DIAGNOSIS — I051 Rheumatic mitral insufficiency: Secondary | ICD-10-CM | POA: Diagnosis not present

## 2021-04-23 ENCOUNTER — Telehealth: Payer: Self-pay | Admitting: *Deleted

## 2021-04-23 DIAGNOSIS — I110 Hypertensive heart disease with heart failure: Secondary | ICD-10-CM | POA: Diagnosis not present

## 2021-04-23 DIAGNOSIS — I509 Heart failure, unspecified: Secondary | ICD-10-CM | POA: Diagnosis not present

## 2021-04-23 DIAGNOSIS — S72141G Displaced intertrochanteric fracture of right femur, subsequent encounter for closed fracture with delayed healing: Secondary | ICD-10-CM | POA: Diagnosis not present

## 2021-04-23 DIAGNOSIS — M19071 Primary osteoarthritis, right ankle and foot: Secondary | ICD-10-CM | POA: Diagnosis not present

## 2021-04-23 DIAGNOSIS — M1711 Unilateral primary osteoarthritis, right knee: Secondary | ICD-10-CM | POA: Diagnosis not present

## 2021-04-23 DIAGNOSIS — D649 Anemia, unspecified: Secondary | ICD-10-CM | POA: Diagnosis not present

## 2021-04-23 DIAGNOSIS — I480 Paroxysmal atrial fibrillation: Secondary | ICD-10-CM | POA: Diagnosis not present

## 2021-04-23 DIAGNOSIS — I2510 Atherosclerotic heart disease of native coronary artery without angina pectoris: Secondary | ICD-10-CM | POA: Diagnosis not present

## 2021-04-23 DIAGNOSIS — I051 Rheumatic mitral insufficiency: Secondary | ICD-10-CM | POA: Diagnosis not present

## 2021-04-23 NOTE — Telephone Encounter (Signed)
FYI VM from [place] w/ [place] Saw pt for start of care yesterday, vitals were all normal, did have a pulse of 78 but a little irregular with occasional speeding up or slowing down. Was not feeling very well yesterday. Not sure is this is from the heat or the irregular heart beat. Pt has an appt with Britney on Friday

## 2021-04-24 ENCOUNTER — Other Ambulatory Visit: Payer: Self-pay

## 2021-04-24 ENCOUNTER — Encounter: Payer: Self-pay | Admitting: Family Medicine

## 2021-04-24 ENCOUNTER — Ambulatory Visit (INDEPENDENT_AMBULATORY_CARE_PROVIDER_SITE_OTHER): Payer: Medicare HMO | Admitting: Family Medicine

## 2021-04-24 VITALS — BP 115/78 | HR 135 | Temp 97.9°F | Ht 61.0 in | Wt 109.6 lb

## 2021-04-24 DIAGNOSIS — I482 Chronic atrial fibrillation, unspecified: Secondary | ICD-10-CM

## 2021-04-24 DIAGNOSIS — R630 Anorexia: Secondary | ICD-10-CM

## 2021-04-24 DIAGNOSIS — Z8781 Personal history of (healed) traumatic fracture: Secondary | ICD-10-CM

## 2021-04-24 DIAGNOSIS — R748 Abnormal levels of other serum enzymes: Secondary | ICD-10-CM

## 2021-04-24 DIAGNOSIS — R1900 Intra-abdominal and pelvic swelling, mass and lump, unspecified site: Secondary | ICD-10-CM | POA: Diagnosis not present

## 2021-04-24 DIAGNOSIS — G479 Sleep disorder, unspecified: Secondary | ICD-10-CM

## 2021-04-24 DIAGNOSIS — R252 Cramp and spasm: Secondary | ICD-10-CM

## 2021-04-24 DIAGNOSIS — Z8701 Personal history of pneumonia (recurrent): Secondary | ICD-10-CM | POA: Diagnosis not present

## 2021-04-24 DIAGNOSIS — R6889 Other general symptoms and signs: Secondary | ICD-10-CM | POA: Diagnosis not present

## 2021-04-24 MED ORDER — MIRTAZAPINE 15 MG PO TABS: 15.0000 mg | ORAL_TABLET | Freq: Every day | ORAL | 0 refills | Status: DC

## 2021-04-24 MED ORDER — METHOCARBAMOL 500 MG PO TABS: 500.0000 mg | ORAL_TABLET | Freq: Three times a day (TID) | ORAL | 0 refills | Status: DC | PRN

## 2021-04-24 MED ORDER — MIRTAZAPINE 15 MG PO TABS: 15.0000 mg | ORAL_TABLET | Freq: Every day | ORAL | 1 refills | Status: DC

## 2021-04-24 NOTE — Progress Notes (Addendum)
Assessment & Plan:  1. History of femur fracture Doing well. Continue therapy at home and walking with walker. Tylenol as needed for pain. - CBC with Differential/Platelet  2. Elevated liver enzymes Labs to assess. - CMP14+EGFR  3. Pelvic mass - CT Abdomen Pelvis WO Contrast; Future  4. History of pneumonia Antibiotics completed. Cough suppressant as needed. - CBC with Differential/Platelet  5. Atrial fibrillation, chronic (HCC) Not currently on anticoagulant as she needs a follow-up CT scan of pelvic mass prior to restarting.  6. Muscle cramps - methocarbamol (ROBAXIN) 500 MG tablet; Take 1 tablet (500 mg total) by mouth every 8 (eight) hours as needed for muscle spasms.  Dispense: 30 tablet; Refill: 0 - CBC with Differential/Platelet - CMP14+EGFR - Vitamin B12  7. Decreased appetite Restarted Remeron. - mirtazapine (REMERON) 15 MG tablet; Take 1 tablet (15 mg total) by mouth at bedtime.  Dispense: 90 tablet; Refill: 1  8. Difficulty sleeping Restarted Remeron. - mirtazapine (REMERON) 15 MG tablet; Take 1 tablet (15 mg total) by mouth at bedtime.  Dispense: 90 tablet; Refill: 1   Return as directed after CT scan.  [name], MSN, APRN, FNP-C [facility]  Subjective:    Patient ID: Norma Barajas, female    DOB: [birth year], 80 y.o.   MRN: [id number]  Patient Care Team: Loman Brooklyn, FNP as PCP - General (Family Medicine) Minus Breeding, MD as PCP - Cardiology (Cardiology) [name], MD (Inactive) as Attending Physician (Gastroenterology) Melina Schools, OD as Consulting Physician (Optometry) Sherran Needs, NP as Consulting Physician (Cardiology)   Chief Complaint:  Chief Complaint  Patient presents with   [place] femur/ hip fracture. Patient states since being out she has had a loss in appetite and also fatigue.  Also having left sided back spasms.      HPI: Norma Barajas is a 80 y.o. female  presenting on 04/24/2021 for Transitions Of Care ([place] femur/ hip fracture. Patient states since being out she has had a loss in appetite and also fatigue.  Also having left sided back spasms.  )  Patient was admitted to [facility] 03/23/2021-03/26/2021 due to a right intertrochanteric femur fracture after a fall at home. The fracture was nailed on 03/24/2021. She was started on ASA 81 mg BID x28 days with the recommendation to discontinue if warfarin is restarted.  Liver enzymes elevated with negative viral hepatitis labs. They were repeated on 03/30/2021 at the [facility] and were back to normal. Simvastatin was held during her hospitalization. It was resumed at discharge.   She has a pelvic sidewall fluid collection which was suspected to be a hematoma from the fall. It was recommended the CT pelvis be repeated in 1-2 weeks. If small, then resume coumadin. If larger, patient will need needle aspiration to rule out infection.  Treated for pneumonia. Patient reports she still has a mild cough that is improving.   Home health has been out multiple times. Their was a call regarding an interaction between her amiodarone and simvastatin. She is NOT taking amiodarone and has not been.    New complaints: Patient reports cramping on the left side of her back for the past 3 weeks. This occurs numerous times throughout the day. She has been taking Tylenol which is somewhat helpful. The cramps are keeping her awake at night..  Patient also reports she does not have an appetite. She was previously  taking Remeron and stopped as she was feeling really well.   Social history:  Relevant past medical, surgical, family and social history reviewed and updated as indicated. Interim medical history since our last visit reviewed.  Allergies and medications reviewed and updated.  DATA REVIEWED: CHART IN EPIC  ROS: Negative unless specifically indicated above in HPI.    Current Outpatient  Medications:    acetaminophen (TYLENOL) 650 MG CR tablet, Take 650 mg by mouth every 6 (six) hours. Displaced intertrochanteric fracture of right femur, Disp: , Rfl:    amLODipine (NORVASC) 10 MG tablet, Take 1 tablet (10 mg total) by mouth daily., Disp: 30 tablet, Rfl: 0   aspirin EC 81 MG tablet, Take 1 tablet (81 mg total) by mouth 2 (two) times daily. Swallow whole. X 30 days.  Stop when warfarin is restarted, Disp: 60 tablet, Rfl: 0   cholecalciferol (VITAMIN D3) 25 MCG (1000 UT) tablet, Take 1,000 Units by mouth daily., Disp: , Rfl:    dextromethorphan-guaiFENesin (MUCINEX DM) 30-600 MG 12hr tablet, Take 1 tablet by mouth 2 (two) times daily., Disp: 30 tablet, Rfl: 0   metoprolol tartrate (LOPRESSOR) 50 MG tablet, Take 1 tablet (50 mg total) by mouth in the morning and at bedtime., Disp: 60 tablet, Rfl: 0   Multiple Vitamin (MULTIVITAMIN) capsule, Take 1 capsule by mouth daily., Disp: , Rfl:    Omega-3 Fatty Acids (FISH OIL) 1000 MG CAPS, Take 1,000 mg by mouth 2 (two) times daily. , Disp: , Rfl:    omeprazole (PRILOSEC) 20 MG capsule, TAKE 1 CAPSULE EVERY DAY, Disp: 90 capsule, Rfl: 1   potassium chloride (KLOR-CON) 10 MEQ tablet, Take 1 tablet (10 mEq total) by mouth 2 (two) times daily., Disp: 60 tablet, Rfl: 0   simvastatin (ZOCOR) 20 MG tablet, Take 1 tablet (20 mg total) by mouth daily., Disp: 90 tablet, Rfl: 1   torsemide (DEMADEX) 20 MG tablet, Take 1 tablet (20 mg total) by mouth daily., Disp: 30 tablet, Rfl: 0   Allergies  Allergen Reactions   Codeine Nausea And Vomiting   Doxycycline Other (See Comments)    Chest congestion   Triamterene-Hctz Other (See Comments)    weakness   Atorvastatin Other (See Comments)    Myalgias    Crestor [Rosuvastatin Calcium] Other (See Comments)    weakness   Morphine Nausea Only   Risedronate Sodium Other (See Comments)    ACTONEL - reflux   Past Medical History:  Diagnosis Date   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 Nonobstructive minimal CAD 10/2005.   Depression    Diastolic dysfunction    Grade 1. Ejection fraction 60-65%.   Dysrhythmia    a fib   Erosive esophagitis    Essential hypertension    GERD (gastroesophageal reflux disease)    Grade III hemorrhoids    History of hiatal hernia    Hypercholesterolemia    Internal hemorrhoids with complication 4/78/2956   OCT 2015 FLEX SIG/IH BANDING     Migraine    "used to have them right bad; I don't now" (04/11/2015)   MITRAL REGURGITATION 04/27/2010   Qualifier: Diagnosis of  By: [name], MD, [name]    Osteoporosis    Paroxysmal atrial fibrillation ([place])    PONV (  postoperative nausea and vomiting)    Vertigo     Past Surgical History:  Procedure Laterality Date   BRAVO [place] STUDY  11/15/2012   Procedure: BRAVO [place];  Surgeon: [name], MD;  [facility];  Service: Endoscopy;;   CARDIOVERSION N/A 05/16/2020   Procedure: CARDIOVERSION;  Surgeon: Donato Heinz, MD;  Location: [place];  Service: Cardiovascular;  Laterality: N/A;   CATARACT EXTRACTION W/ INTRAOCULAR LENS  IMPLANT, BILATERAL Bilateral    COLONOSCOPY  2008   Dr. [name]: internal hemorrhoids    DILATION AND CURETTAGE OF UTERUS     ESOPHAGOGASTRODUODENOSCOPY (EGD) WITH ESOPHAGEAL DILATION  2001   Dr. [name]: erosive esophagitis, esophageal stricture, duodenitis, s/p Savary dilation   ESOPHAGOGASTRODUODENOSCOPY (EGD) WITH ESOPHAGEAL DILATION  11/15/2012   EPP:IRJJOACZYS web was found & MOST LIKELY CAUSE FOR DYAPHAGIA/Polyp was found in the gastric body and gastric fundus/ gastritis on bx   EYE SURGERY Bilateral    "laser OR after cataract OR; cause I couldn't see"   FLEXIBLE SIGMOIDOSCOPY N/A 08/22/2014   mild diverticulosis in sigmoid, moderate sized Grade 3 hemorrhoids  s/p banding X 3.    FRACTURE SURGERY Right    below the knee - 2 bones broke has plates   HEMORRHOID BANDING N/A 08/22/2014   Procedure: HEMORRHOID BANDING;  Surgeon: [name], MD;  [facility];  Service: Endoscopy;  Laterality: N/A;   HEMORRHOID SURGERY N/A 03/30/2018   Procedure: EXTENSIVE HEMORRHOIDECTOMY;  Surgeon: [name], MD;  Location: [facility];  Service: General;  Laterality: N/A;   INTRAMEDULLARY (IM) NAIL INTERTROCHANTERIC Right 03/24/2021   Procedure: INTRAMEDULLARY (IM) NAIL INTERTROCHANTRIC;  Surgeon: Mordecai Rasmussen, MD;  Location: [facility];  Service: Orthopedics;  Laterality: Right;   OPEN REDUCTION INTERNAL FIXATION (ORIF) TIBIA/FIBULA FRACTURE Right 2013   broke tibia and fibula after falling down stairs   PROLAPSED UTERINE FIBROID LIGATION  2015   TOTAL ABDOMINAL HYSTERECTOMY      Social History   Socioeconomic History   Marital status: Married    Spouse name: george    Number of children: 1   Years of education: Not on file   Highest education level: Not on file  Occupational History   Occupation: Retired    Comment: Textile  Tobacco Use   Smoking status: Never   Smokeless tobacco: Never  Scientific laboratory technician Use: Never used  Substance and Sexual Activity   Alcohol use: No    Alcohol/week: 0.0 standard drinks   Drug use: No   Sexual activity: Not Currently    Birth control/protection: Surgical, Post-menopausal    Comment: hyst  Other Topics Concern   Not on file  Social History Narrative   Married   No regular exercise   Social Determinants of Health   Financial Resource Strain: Not on file  Food Insecurity: Not on file  Transportation Needs: Not on file  Physical Activity: Not on file  Stress: Not on file  Social Connections: Not on file  Intimate Partner Violence: Not on file        Objective:    BP 115/78   Pulse (!) 135   Temp 97.9 F (36.6 C) (Temporal)   Ht 5' 1" (1.549 m)   Wt 109 lb 9.6 oz (49.7 kg)   SpO2  96%   BMI 20.71 kg/m   Wt Readings from Last 3 Encounters:  04/24/21 109 lb 9.6 oz (49.7 kg)  03/27/21 110 lb (49.9 kg)  03/24/21 109 lb 12.6 oz (49.8 kg)    Physical Exam Vitals reviewed.  Constitutional:      General: She is not in acute distress.    Appearance: Normal appearance. She is not ill-appearing, toxic-appearing or diaphoretic.  HENT:     Head: Normocephalic and atraumatic.  Eyes:     General: No scleral icterus.       Right eye: No discharge.        Left eye: No discharge.     Conjunctiva/sclera: Conjunctivae normal.  Cardiovascular:     Rate and Rhythm: Normal rate and regular rhythm.     Heart sounds: Normal heart sounds. No murmur heard.   No friction rub. No gallop.  Pulmonary:     Effort: Pulmonary effort is normal. No respiratory distress.     Breath sounds: Normal breath sounds. No stridor. No wheezing, rhonchi or rales.  Musculoskeletal:        General: Normal range of motion.     Cervical back: Normal range of motion.  Skin:    General: Skin is warm and dry.     Capillary Refill: Capillary refill takes less than 2 seconds.  Neurological:     General: No focal deficit present.     Mental Status: She is alert and oriented to person, place, and time. Mental status is at baseline.     Gait: Gait abnormal (walking with walker).  Psychiatric:        Mood and Affect: Mood normal.        Behavior: Behavior normal.        Thought Content: Thought content normal.        Judgment: Judgment normal.    Lab Results  Component Value Date   TSH 0.805 05/20/2020   Lab Results  Component Value Date   WBC 9.9 03/30/2021   HGB 10.9 (L) 03/30/2021   HCT 33.5 (L) 03/30/2021   MCV 96.8 03/30/2021   PLT 231 03/30/2021   Lab Results  Component Value Date   NA 138 03/30/2021   K 3.9 03/30/2021   CO2 33 (H) 03/30/2021   GLUCOSE 108 (H) 03/30/2021   BUN 23 03/30/2021   CREATININE 0.79 03/30/2021   BILITOT 0.8 03/30/2021   ALKPHOS 110 03/30/2021   AST 17  03/30/2021   ALT 20 03/30/2021   PROT 6.1 (L) 03/30/2021   ALBUMIN 2.6 (L) 03/30/2021   CALCIUM 8.4 (L) 03/30/2021   ANIONGAP 8 03/30/2021   EGFR 77 01/22/2021   Lab Results  Component Value Date   CHOL 164 01/22/2021   Lab Results  Component Value Date   HDL 49 01/22/2021   Lab Results  Component Value Date   LDLCALC 90 01/22/2021   Lab Results  Component Value Date   TRIG 142 01/22/2021   Lab Results  Component Value Date   CHOLHDL 3.3 01/22/2021   Lab Results  Component Value Date   HGBA1C 5.6 02/28/2017

## 2021-04-25 LAB — CMP14+EGFR
ALT: 12 IU/L (ref 0–32)
AST: 14 IU/L (ref 0–40)
Albumin/Globulin Ratio: 1.5 (ref 1.2–2.2)
Albumin: 3.9 g/dL (ref 3.6–4.6)
Alkaline Phosphatase: 134 IU/L — ABNORMAL HIGH (ref 44–121)
BUN/Creatinine Ratio: 18 (ref 12–28)
BUN: 20 mg/dL (ref 8–27)
Bilirubin Total: 0.4 mg/dL (ref 0.0–1.2)
CO2: 19 mmol/L — ABNORMAL LOW (ref 20–29)
Calcium: 8.6 mg/dL — ABNORMAL LOW (ref 8.7–10.3)
Chloride: 100 mmol/L (ref 96–106)
Creatinine, Ser: 1.13 mg/dL — ABNORMAL HIGH (ref 0.57–1.00)
Globulin, Total: 2.6 g/dL (ref 1.5–4.5)
Glucose: 118 mg/dL — ABNORMAL HIGH (ref 65–99)
Potassium: 3.6 mmol/L (ref 3.5–5.2)
Sodium: 141 mmol/L (ref 134–144)
Total Protein: 6.5 g/dL (ref 6.0–8.5)
eGFR: 49 mL/min/{1.73_m2} — ABNORMAL LOW (ref >59)

## 2021-04-25 LAB — CBC WITH DIFFERENTIAL/PLATELET
Basophils Absolute: 0.1 10*3/uL (ref 0.0–0.2)
Basos: 1 %
EOS (ABSOLUTE): 0.1 10*3/uL (ref 0.0–0.4)
Eos: 1 %
Hematocrit: 38.3 % (ref 34.0–46.6)
Hemoglobin: 12.6 g/dL (ref 11.1–15.9)
Immature Grans (Abs): 0 10*3/uL (ref 0.0–0.1)
Immature Granulocytes: 0 %
Lymphocytes Absolute: 2.1 10*3/uL (ref 0.7–3.1)
Lymphs: 29 %
MCH: 31.1 pg (ref 26.6–33.0)
MCHC: 32.9 g/dL (ref 31.5–35.7)
MCV: 95 fL (ref 79–97)
Monocytes Absolute: 0.7 10*3/uL (ref 0.1–0.9)
Monocytes: 9 %
Neutrophils Absolute: 4.4 10*3/uL (ref 1.4–7.0)
Neutrophils: 60 %
Platelets: 187 10*3/uL (ref 150–450)
RBC: 4.05 x10E6/uL (ref 3.77–5.28)
RDW: 14.3 % (ref 11.7–15.4)
WBC: 7.3 10*3/uL (ref 3.4–10.8)

## 2021-04-25 LAB — VITAMIN B12: Vitamin B-12: 692 pg/mL (ref 232–1245)

## 2021-04-27 ENCOUNTER — Encounter: Payer: Self-pay | Admitting: Family Medicine

## 2021-04-27 ENCOUNTER — Other Ambulatory Visit: Payer: Self-pay | Admitting: Cardiology

## 2021-04-27 ENCOUNTER — Telehealth: Payer: Self-pay | Admitting: Family Medicine

## 2021-04-27 NOTE — Addendum Note (Signed)
Addended by: Loman Brooklyn on: 04/27/2021 08:56 AM   Modules accepted: Orders

## 2021-04-28 DIAGNOSIS — I480 Paroxysmal atrial fibrillation: Secondary | ICD-10-CM | POA: Diagnosis not present

## 2021-04-28 DIAGNOSIS — I110 Hypertensive heart disease with heart failure: Secondary | ICD-10-CM | POA: Diagnosis not present

## 2021-04-28 DIAGNOSIS — I2510 Atherosclerotic heart disease of native coronary artery without angina pectoris: Secondary | ICD-10-CM | POA: Diagnosis not present

## 2021-04-28 DIAGNOSIS — I051 Rheumatic mitral insufficiency: Secondary | ICD-10-CM | POA: Diagnosis not present

## 2021-04-28 DIAGNOSIS — D649 Anemia, unspecified: Secondary | ICD-10-CM | POA: Diagnosis not present

## 2021-04-28 DIAGNOSIS — S72141G Displaced intertrochanteric fracture of right femur, subsequent encounter for closed fracture with delayed healing: Secondary | ICD-10-CM | POA: Diagnosis not present

## 2021-04-28 DIAGNOSIS — I509 Heart failure, unspecified: Secondary | ICD-10-CM | POA: Diagnosis not present

## 2021-04-28 DIAGNOSIS — M1711 Unilateral primary osteoarthritis, right knee: Secondary | ICD-10-CM | POA: Diagnosis not present

## 2021-04-28 DIAGNOSIS — M19071 Primary osteoarthritis, right ankle and foot: Secondary | ICD-10-CM | POA: Diagnosis not present

## 2021-04-29 ENCOUNTER — Other Ambulatory Visit: Payer: Self-pay

## 2021-04-29 ENCOUNTER — Ambulatory Visit (HOSPITAL_COMMUNITY)
Admission: RE | Admit: 2021-04-29 | Discharge: 2021-04-29 | Disposition: A | Discharge status: Home / Self Care | Payer: Medicare HMO | Source: Ambulatory Visit | Attending: Family Medicine | Admitting: Family Medicine

## 2021-04-29 DIAGNOSIS — D3500 Benign neoplasm of unspecified adrenal gland: Secondary | ICD-10-CM | POA: Diagnosis not present

## 2021-04-29 DIAGNOSIS — R188 Other ascites: Secondary | ICD-10-CM | POA: Diagnosis not present

## 2021-04-29 DIAGNOSIS — R1909 Other intra-abdominal and pelvic swelling, mass and lump: Secondary | ICD-10-CM | POA: Diagnosis not present

## 2021-04-29 DIAGNOSIS — R1900 Intra-abdominal and pelvic swelling, mass and lump, unspecified site: Secondary | ICD-10-CM | POA: Insufficient documentation

## 2021-04-30 ENCOUNTER — Encounter: Payer: Self-pay | Admitting: Family Medicine

## 2021-04-30 ENCOUNTER — Ambulatory Visit (INDEPENDENT_AMBULATORY_CARE_PROVIDER_SITE_OTHER): Payer: Medicare HMO | Admitting: Family Medicine

## 2021-04-30 VITALS — BP 110/78 | HR 145 | Temp 97.3°F | Ht 61.0 in | Wt 112.0 lb

## 2021-04-30 DIAGNOSIS — D649 Anemia, unspecified: Secondary | ICD-10-CM | POA: Diagnosis not present

## 2021-04-30 DIAGNOSIS — N9489 Other specified conditions associated with female genital organs and menstrual cycle: Secondary | ICD-10-CM | POA: Diagnosis not present

## 2021-04-30 DIAGNOSIS — I2510 Atherosclerotic heart disease of native coronary artery without angina pectoris: Secondary | ICD-10-CM | POA: Diagnosis not present

## 2021-04-30 DIAGNOSIS — M5432 Sciatica, left side: Secondary | ICD-10-CM

## 2021-04-30 DIAGNOSIS — M546 Pain in thoracic spine: Secondary | ICD-10-CM | POA: Diagnosis not present

## 2021-04-30 DIAGNOSIS — Z79899 Other long term (current) drug therapy: Secondary | ICD-10-CM | POA: Diagnosis not present

## 2021-04-30 DIAGNOSIS — I480 Paroxysmal atrial fibrillation: Secondary | ICD-10-CM | POA: Diagnosis not present

## 2021-04-30 DIAGNOSIS — R630 Anorexia: Secondary | ICD-10-CM | POA: Diagnosis not present

## 2021-04-30 DIAGNOSIS — I110 Hypertensive heart disease with heart failure: Secondary | ICD-10-CM | POA: Diagnosis not present

## 2021-04-30 DIAGNOSIS — I051 Rheumatic mitral insufficiency: Secondary | ICD-10-CM | POA: Diagnosis not present

## 2021-04-30 DIAGNOSIS — M19071 Primary osteoarthritis, right ankle and foot: Secondary | ICD-10-CM | POA: Diagnosis not present

## 2021-04-30 DIAGNOSIS — M1711 Unilateral primary osteoarthritis, right knee: Secondary | ICD-10-CM | POA: Diagnosis not present

## 2021-04-30 DIAGNOSIS — S72141G Displaced intertrochanteric fracture of right femur, subsequent encounter for closed fracture with delayed healing: Secondary | ICD-10-CM | POA: Diagnosis not present

## 2021-04-30 DIAGNOSIS — I509 Heart failure, unspecified: Secondary | ICD-10-CM | POA: Diagnosis not present

## 2021-04-30 MED ORDER — SIMVASTATIN 20 MG PO TABS: 20.0000 mg | ORAL_TABLET | Freq: Every day | ORAL | 1 refills | Status: DC

## 2021-04-30 MED ORDER — AMLODIPINE BESYLATE 10 MG PO TABS: 1.0000 | ORAL_TABLET | Freq: Every day | ORAL | 1 refills | Status: DC

## 2021-04-30 MED ORDER — PREDNISONE 10 MG (21) PO TBPK: ORAL_TABLET | ORAL | 0 refills | Status: DC

## 2021-04-30 MED ORDER — ASPIRIN EC 81 MG PO TBEC: 81.0000 mg | DELAYED_RELEASE_TABLET | Freq: Two times a day (BID) | ORAL | 1 refills | Status: DC

## 2021-04-30 NOTE — Progress Notes (Signed)
Assessment & Plan:  1. Paroxysmal atrial fibrillation (HCC) Patient has a CHA2DS2-VASc score of 3, putting her at moderate to high risk of a stroke.  She has a has bled score of 3, putting her at high risk for a bleed.  Given how frequently she has been falling recently I feel her risk of a bleed is higher than her risk of a stroke.  She, her husband, and myself discussed this together, discussing risk and benefits of both continuing or discontinuing her Coumadin.  We agreeably decided to discontinue Coumadin.  She will remain on aspirin 81 mg twice daily.  Encouraged to take her metoprolol daily as prescribed and explained that this is her rate control. - aspirin EC 81 MG tablet; Take 1 tablet (81 mg total) by mouth 2 (two) times daily.  Dispense: 180 tablet; Refill: 1  2. Female pelvic hematoma Decreasing in size.  3. Acute left-sided thoracic back pain Continue Tylenol as needed. - predniSONE (STERAPRED UNI-PAK 21 TAB) 10 MG (21) TBPK tablet; As directed x 6 days  Dispense: 21 tablet; Refill: 0  4. Left sided sciatica - predniSONE (STERAPRED UNI-PAK 21 TAB) 10 MG (21) TBPK tablet; As directed x 6 days  Dispense: 21 tablet; Refill: 0  5. Decreased appetite Encouraged to start mirtazapine as ordered.  6. Medication management Medication list was printed out and notes made beside of each medication explaining to patient what it was for.  She is not taking her medications consistently.   Return in about 6 weeks (around 06/11/2021) for follow-up of chronic medication conditions.  [name], MSN, APRN, FNP-C [facility]  Subjective:    Patient ID: Norma Barajas, female    DOB: 1941/01/20, 80 y.o.   MRN: [id number]  Patient Care Team: Loman Brooklyn, FNP as PCP - General (Family Medicine) Minus Breeding, MD as PCP - Cardiology (Cardiology) [name], MD (Inactive) as Attending Physician (Gastroenterology) Melina Schools, OD as Consulting Physician  (Optometry) Sherran Needs, NP as Consulting Physician (Cardiology)   Chief Complaint:  Chief Complaint  Patient presents with   CT results    HPI: Norma Barajas is a 80 y.o. female presenting on 04/30/2021 for CT results  Patient is accompanied by her husband, who she is okay with being present.  Patient was asked to come in today to discuss her CT results and her Coumadin.  She has had multiple falls recently.  Her most recent fall resulted in a femur fracture.  She had a fall at the end of last year that resulted in pelvic fractures and a lumbar spine fracture.  Both of these times she experienced pelvic hematomas and required blood transfusions.  After her recent fall it was recommended to repeat the CT scan and resume her Coumadin if the hematoma was smaller.  The CT scan has been done and the size of the hematoma decreased from 4.8 x 3.0 down to 4.2 x 2.2.  New complaints: Patient reports she is still having back pain that she describes as grabbing.  She has been taking 2 Tylenol every 6 hours which is somewhat helpful.  She reports the pain has started radiating down her left leg.  She was given Robaxin at our last visit which she states made her chest hurt.  Patient reports her heart is racing and she does not like how it feels.  She has not been taking her metoprolol.  Patient is concerned about her decreased appetite.  She  has not started taking the mirtazapine that was started at our last visit.   Social history:  Relevant past medical, surgical, family and social history reviewed and updated as indicated. Interim medical history since our last visit reviewed.  Allergies and medications reviewed and updated.  DATA REVIEWED: CHART IN EPIC  ROS: Negative unless specifically indicated above in HPI.    Current Outpatient Medications:    acetaminophen (TYLENOL) 650 MG CR tablet, Take 650 mg by mouth every 6 (six) hours. Displaced intertrochanteric fracture of right femur,  Disp: , Rfl:    amLODipine (NORVASC) 10 MG tablet, Take 1 tablet (10 mg total) by mouth daily., Disp: 30 tablet, Rfl: 0   aspirin EC 81 MG tablet, Take 1 tablet (81 mg total) by mouth 2 (two) times daily. Swallow whole. X 30 days.  Stop when warfarin is restarted, Disp: 60 tablet, Rfl: 0   cholecalciferol (VITAMIN D3) 25 MCG (1000 UT) tablet, Take 1,000 Units by mouth daily., Disp: , Rfl:    methocarbamol (ROBAXIN) 500 MG tablet, Take 1 tablet (500 mg total) by mouth every 8 (eight) hours as needed for muscle spasms., Disp: 30 tablet, Rfl: 0   metoprolol tartrate (LOPRESSOR) 50 MG tablet, TAKE 1 TABLET (50 MG TOTAL) BY MOUTH 2 (TWO) TIMES DAILY., Disp: 180 tablet, Rfl: 1   mirtazapine (REMERON) 15 MG tablet, Take 1 tablet (15 mg total) by mouth at bedtime., Disp: 90 tablet, Rfl: 1   Multiple Vitamin (MULTIVITAMIN) capsule, Take 1 capsule by mouth daily., Disp: , Rfl:    Omega-3 Fatty Acids (FISH OIL) 1000 MG CAPS, Take 1,000 mg by mouth 2 (two) times daily. , Disp: , Rfl:    omeprazole (PRILOSEC) 20 MG capsule, TAKE 1 CAPSULE EVERY DAY, Disp: 90 capsule, Rfl: 1   potassium chloride (KLOR-CON) 10 MEQ tablet, Take 1 tablet (10 mEq total) by mouth 2 (two) times daily., Disp: 60 tablet, Rfl: 0   simvastatin (ZOCOR) 20 MG tablet, Take 1 tablet (20 mg total) by mouth daily., Disp: 90 tablet, Rfl: 1   torsemide (DEMADEX) 20 MG tablet, Take 1 tablet (20 mg total) by mouth daily., Disp: 30 tablet, Rfl: 0   Allergies  Allergen Reactions   Codeine Nausea And Vomiting   Doxycycline Other (See Comments)    Chest congestion   Triamterene-Hctz Other (See Comments)    weakness   Atorvastatin Other (See Comments)    Myalgias    Crestor [Rosuvastatin Calcium] Other (See Comments)    weakness   Morphine Nausea Only   Risedronate Sodium Other (See Comments)    ACTONEL - reflux   Past Medical History:  Diagnosis Date   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 Nonobstructive minimal CAD 10/2005.   Depression    Diastolic dysfunction    Grade 1. Ejection fraction 60-65%.   Dysrhythmia    a fib   Erosive esophagitis    Essential hypertension    GERD (gastroesophageal reflux disease)    Grade III hemorrhoids    History of hiatal hernia    Hypercholesterolemia    Internal hemorrhoids with complication 6/75/9163   OCT 2015 FLEX SIG/IH BANDING     Migraine    "used to have them right bad; I don't now" (04/11/2015)   MITRAL  REGURGITATION 04/27/2010   Qualifier: Diagnosis of  By: [name], MD, [name]    Osteoporosis    Paroxysmal atrial fibrillation ([place])    PONV (postoperative nausea and vomiting)    Vertigo     Past Surgical History:  Procedure Laterality Date   BRAVO [facility] STUDY  11/15/2012   Procedure: BRAVO [place];  Surgeon: [name], MD;  [facility];  Service: Endoscopy;;   CARDIOVERSION N/A 05/16/2020   Procedure: CARDIOVERSION;  Surgeon: Donato Heinz, MD;  Location: [facility] ENDOSCOPY;  Service: Cardiovascular;  Laterality: N/A;   CATARACT EXTRACTION W/ INTRAOCULAR LENS  IMPLANT, BILATERAL Bilateral    COLONOSCOPY  2008   Dr. [name]: internal hemorrhoids    DILATION AND CURETTAGE OF UTERUS     ESOPHAGOGASTRODUODENOSCOPY (EGD) WITH ESOPHAGEAL DILATION  2001   Dr. [name]: erosive esophagitis, esophageal stricture, duodenitis, s/p Savary dilation   ESOPHAGOGASTRODUODENOSCOPY (EGD) WITH ESOPHAGEAL DILATION  11/15/2012   KAJ:[id number] web was found & MOST LIKELY CAUSE FOR DYAPHAGIA/Polyp was found in the gastric body and gastric fundus/ gastritis on bx   EYE SURGERY Bilateral    "laser OR after cataract OR; cause I couldn't see"   FLEXIBLE SIGMOIDOSCOPY N/A 08/22/2014   mild diverticulosis in sigmoid, moderate sized Grade 3 hemorrhoids s/p banding X 3.    FRACTURE SURGERY  Right    below the knee - 2 bones broke has plates   HEMORRHOID BANDING N/A 08/22/2014   Procedure: HEMORRHOID BANDING;  Surgeon: [name], MD;  [facility];  Service: Endoscopy;  Laterality: N/A;   HEMORRHOID SURGERY N/A 03/30/2018   Procedure: EXTENSIVE HEMORRHOIDECTOMY;  Surgeon: [name], MD;  Location[facility];  Service: General;  Laterality: N/A;   INTRAMEDULLARY (IM) NAIL INTERTROCHANTERIC Right 03/24/2021   Procedure: INTRAMEDULLARY (IM) NAIL INTERTROCHANTRIC;  Surgeon: Mordecai Rasmussen, MD;  Location[facility];  Service: Orthopedics;  Laterality: Right;   OPEN REDUCTION INTERNAL FIXATION (ORIF) TIBIA/FIBULA FRACTURE Right 2013   broke tibia and fibula after falling down stairs   PROLAPSED UTERINE FIBROID LIGATION  2015   TOTAL ABDOMINAL HYSTERECTOMY      Social History   Socioeconomic History   Marital status: Married    Spouse name: george    Number of children: 1   Years of education: Not on file   Highest education level: Not on file  Occupational History   Occupation: Retired    Comment: Textile  Tobacco Use   Smoking status: Never   Smokeless tobacco: Never  Scientific laboratory technician Use: Never used  Substance and Sexual Activity   Alcohol use: No    Alcohol/week: 0.0 standard drinks   Drug use: No   Sexual activity: Not Currently    Birth control/protection: Surgical, Post-menopausal    Comment: hyst  Other Topics Concern   Not on file  Social History Narrative   Married   No regular exercise   Social Determinants of Health   Financial Resource Strain: Not on file  Food Insecurity: Not on file  Transportation Needs: Not on file  Physical Activity: Not on file  Stress: Not on file  Social Connections: Not on file  Intimate Partner Violence: Not on file        Objective:    BP 110/78   Pulse (!) 145   Temp (!) 97.3 F (36.3 C) (Temporal)   Ht 5' 1"  (1.549 m)   Wt 112 lb (50.8 kg)   SpO2 93%  BMI 21.16 kg/m   Wt Readings  from Last 3 Encounters:  04/30/21 112 lb (50.8 kg)  04/24/21 109 lb 9.6 oz (49.7 kg)  03/27/21 110 lb (49.9 kg)    Physical Exam Vitals reviewed.  Constitutional:      General: She is not in acute distress.    Appearance: Normal appearance. She is normal weight. She is not ill-appearing, toxic-appearing or diaphoretic.  HENT:     Head: Normocephalic and atraumatic.  Eyes:     General: No scleral icterus.       Right eye: No discharge.        Left eye: No discharge.     Conjunctiva/sclera: Conjunctivae normal.  Cardiovascular:     Rate and Rhythm: Tachycardia present.  Pulmonary:     Effort: Pulmonary effort is normal. No respiratory distress.  Musculoskeletal:        General: Normal range of motion.     Cervical back: Normal range of motion.  Skin:    General: Skin is warm and dry.     Capillary Refill: Capillary refill takes less than 2 seconds.  Neurological:     General: No focal deficit present.     Mental Status: She is alert and oriented to person, place, and time. Mental status is at baseline.  Psychiatric:        Mood and Affect: Mood normal.        Behavior: Behavior normal.        Thought Content: Thought content normal.        Judgment: Judgment normal.    Lab Results  Component Value Date   TSH 0.805 05/20/2020   Lab Results  Component Value Date   WBC 7.3 04/24/2021   HGB 12.6 04/24/2021   HCT 38.3 04/24/2021   MCV 95 04/24/2021   PLT 187 04/24/2021   Lab Results  Component Value Date   NA 141 04/24/2021   K 3.6 04/24/2021   CO2 19 (L) 04/24/2021   GLUCOSE 118 (H) 04/24/2021   BUN 20 04/24/2021   CREATININE 1.13 (H) 04/24/2021   BILITOT 0.4 04/24/2021   ALKPHOS 134 (H) 04/24/2021   AST 14 04/24/2021   ALT 12 04/24/2021   PROT 6.5 04/24/2021   ALBUMIN 3.9 04/24/2021   CALCIUM 8.6 (L) 04/24/2021   ANIONGAP 8 03/30/2021   EGFR 49 (L) 04/24/2021   Lab Results  Component Value Date   CHOL 164 01/22/2021   Lab Results  Component Value  Date   HDL 49 01/22/2021   Lab Results  Component Value Date   LDLCALC 90 01/22/2021   Lab Results  Component Value Date   TRIG 142 01/22/2021   Lab Results  Component Value Date   CHOLHDL 3.3 01/22/2021   Lab Results  Component Value Date   HGBA1C 5.6 02/28/2017

## 2021-05-01 ENCOUNTER — Encounter: Payer: Self-pay | Admitting: Family Medicine

## 2021-05-01 ENCOUNTER — Ambulatory Visit (INDEPENDENT_AMBULATORY_CARE_PROVIDER_SITE_OTHER): Payer: Medicare HMO

## 2021-05-01 ENCOUNTER — Other Ambulatory Visit: Payer: Self-pay | Admitting: *Deleted

## 2021-05-01 ENCOUNTER — Other Ambulatory Visit: Payer: Self-pay

## 2021-05-01 ENCOUNTER — Telehealth: Payer: Self-pay | Admitting: *Deleted

## 2021-05-01 DIAGNOSIS — E46 Unspecified protein-calorie malnutrition: Secondary | ICD-10-CM

## 2021-05-01 DIAGNOSIS — S72141G Displaced intertrochanteric fracture of right femur, subsequent encounter for closed fracture with delayed healing: Secondary | ICD-10-CM

## 2021-05-01 DIAGNOSIS — F32A Depression, unspecified: Secondary | ICD-10-CM

## 2021-05-01 DIAGNOSIS — K2100 Gastro-esophageal reflux disease with esophagitis, without bleeding: Secondary | ICD-10-CM

## 2021-05-01 DIAGNOSIS — I509 Heart failure, unspecified: Secondary | ICD-10-CM

## 2021-05-01 DIAGNOSIS — R059 Cough, unspecified: Secondary | ICD-10-CM

## 2021-05-01 DIAGNOSIS — I480 Paroxysmal atrial fibrillation: Secondary | ICD-10-CM | POA: Diagnosis not present

## 2021-05-01 DIAGNOSIS — H9190 Unspecified hearing loss, unspecified ear: Secondary | ICD-10-CM

## 2021-05-01 DIAGNOSIS — M1711 Unilateral primary osteoarthritis, right knee: Secondary | ICD-10-CM | POA: Diagnosis not present

## 2021-05-01 DIAGNOSIS — F419 Anxiety disorder, unspecified: Secondary | ICD-10-CM

## 2021-05-01 DIAGNOSIS — K5730 Diverticulosis of large intestine without perforation or abscess without bleeding: Secondary | ICD-10-CM

## 2021-05-01 DIAGNOSIS — Z9181 History of falling: Secondary | ICD-10-CM

## 2021-05-01 DIAGNOSIS — I2510 Atherosclerotic heart disease of native coronary artery without angina pectoris: Secondary | ICD-10-CM | POA: Diagnosis not present

## 2021-05-01 DIAGNOSIS — M19071 Primary osteoarthritis, right ankle and foot: Secondary | ICD-10-CM

## 2021-05-01 DIAGNOSIS — I051 Rheumatic mitral insufficiency: Secondary | ICD-10-CM | POA: Diagnosis not present

## 2021-05-01 DIAGNOSIS — H8110 Benign paroxysmal vertigo, unspecified ear: Secondary | ICD-10-CM

## 2021-05-01 DIAGNOSIS — Z7982 Long term (current) use of aspirin: Secondary | ICD-10-CM

## 2021-05-01 DIAGNOSIS — D649 Anemia, unspecified: Secondary | ICD-10-CM | POA: Diagnosis not present

## 2021-05-01 DIAGNOSIS — M810 Age-related osteoporosis without current pathological fracture: Secondary | ICD-10-CM

## 2021-05-01 DIAGNOSIS — I110 Hypertensive heart disease with heart failure: Secondary | ICD-10-CM | POA: Diagnosis not present

## 2021-05-01 DIAGNOSIS — G43909 Migraine, unspecified, not intractable, without status migrainosus: Secondary | ICD-10-CM

## 2021-05-01 DIAGNOSIS — E7800 Pure hypercholesterolemia, unspecified: Secondary | ICD-10-CM

## 2021-05-01 DIAGNOSIS — Z8701 Personal history of pneumonia (recurrent): Secondary | ICD-10-CM

## 2021-05-01 NOTE — Telephone Encounter (Signed)
Patient reports she is taking both medications daily.

## 2021-05-01 NOTE — Telephone Encounter (Signed)
VM from Simona Huh PT w/ [facility] Saw pt today HR 123 & irregular today, has gained 7 lbs in 9 days, not feeling well

## 2021-05-01 NOTE — Telephone Encounter (Signed)
Please make sure she is taking medications as prescribed. Reported yesterday she was not taking metoprolol. She also has a fluid pill she needs to be taking.

## 2021-05-04 ENCOUNTER — Telehealth: Payer: Self-pay | Admitting: Family Medicine

## 2021-05-04 DIAGNOSIS — M1711 Unilateral primary osteoarthritis, right knee: Secondary | ICD-10-CM | POA: Diagnosis not present

## 2021-05-04 DIAGNOSIS — I110 Hypertensive heart disease with heart failure: Secondary | ICD-10-CM | POA: Diagnosis not present

## 2021-05-04 DIAGNOSIS — D649 Anemia, unspecified: Secondary | ICD-10-CM | POA: Diagnosis not present

## 2021-05-04 DIAGNOSIS — I051 Rheumatic mitral insufficiency: Secondary | ICD-10-CM | POA: Diagnosis not present

## 2021-05-04 DIAGNOSIS — I2510 Atherosclerotic heart disease of native coronary artery without angina pectoris: Secondary | ICD-10-CM | POA: Diagnosis not present

## 2021-05-04 DIAGNOSIS — I480 Paroxysmal atrial fibrillation: Secondary | ICD-10-CM | POA: Diagnosis not present

## 2021-05-04 DIAGNOSIS — I509 Heart failure, unspecified: Secondary | ICD-10-CM | POA: Diagnosis not present

## 2021-05-04 DIAGNOSIS — M19071 Primary osteoarthritis, right ankle and foot: Secondary | ICD-10-CM | POA: Diagnosis not present

## 2021-05-04 DIAGNOSIS — S72141G Displaced intertrochanteric fracture of right femur, subsequent encounter for closed fracture with delayed healing: Secondary | ICD-10-CM | POA: Diagnosis not present

## 2021-05-04 NOTE — Telephone Encounter (Signed)
Nurse Vinnie Level called to give patient report.  Says patient has had a 4 lb weight gain since this past Friday.  O2 Stat is 98 Heart Rate is between 130-140 BP is 104/68  Patient has had a decrease in urination and is complaining about not feeling well.  Can contact Vinnie Level at [phone]

## 2021-05-04 NOTE — Telephone Encounter (Signed)
Norma Barajas states that she has 2 + pitting Edema both lower extremities. She has some sob and feels like her heart is fluttering. This morning she had taken in 1 bottle of water before Norma Barajas arrived around 11am- she has no appetite. Remeron was started on Thursday. No chest pain. She still feels the same as she did when she was in office on 06/23.

## 2021-05-04 NOTE — Telephone Encounter (Signed)
Is she swelling? Short of breath? Is she feeling her heart racing? Chest pain? When was the last time she urinated? How much and what type of fluids has she drank today? What does not feeling well consist of?

## 2021-05-05 NOTE — Telephone Encounter (Signed)
She can take an extra dose of torsemide and potassium today. She needs to schedule an appointment with cardiology. Increase fluid intake. If she is not urinating, she needs to go to the ER. Please make sure she is taking her medications. Often she reports yes when initially asked and then later tells me she is not taking them.

## 2021-05-05 NOTE — Telephone Encounter (Signed)
Patient aware and verbalizes understanding. Also states that she is taking all of her medications.

## 2021-05-06 DIAGNOSIS — I509 Heart failure, unspecified: Secondary | ICD-10-CM | POA: Diagnosis not present

## 2021-05-06 DIAGNOSIS — I110 Hypertensive heart disease with heart failure: Secondary | ICD-10-CM | POA: Diagnosis not present

## 2021-05-06 DIAGNOSIS — I051 Rheumatic mitral insufficiency: Secondary | ICD-10-CM | POA: Diagnosis not present

## 2021-05-06 DIAGNOSIS — M19071 Primary osteoarthritis, right ankle and foot: Secondary | ICD-10-CM | POA: Diagnosis not present

## 2021-05-06 DIAGNOSIS — M1711 Unilateral primary osteoarthritis, right knee: Secondary | ICD-10-CM | POA: Diagnosis not present

## 2021-05-06 DIAGNOSIS — D649 Anemia, unspecified: Secondary | ICD-10-CM | POA: Diagnosis not present

## 2021-05-06 DIAGNOSIS — I480 Paroxysmal atrial fibrillation: Secondary | ICD-10-CM | POA: Diagnosis not present

## 2021-05-06 DIAGNOSIS — I2510 Atherosclerotic heart disease of native coronary artery without angina pectoris: Secondary | ICD-10-CM | POA: Diagnosis not present

## 2021-05-06 DIAGNOSIS — S72141G Displaced intertrochanteric fracture of right femur, subsequent encounter for closed fracture with delayed healing: Secondary | ICD-10-CM | POA: Diagnosis not present

## 2021-05-07 DIAGNOSIS — I480 Paroxysmal atrial fibrillation: Secondary | ICD-10-CM | POA: Diagnosis not present

## 2021-05-07 DIAGNOSIS — I051 Rheumatic mitral insufficiency: Secondary | ICD-10-CM | POA: Diagnosis not present

## 2021-05-07 DIAGNOSIS — S72141G Displaced intertrochanteric fracture of right femur, subsequent encounter for closed fracture with delayed healing: Secondary | ICD-10-CM | POA: Diagnosis not present

## 2021-05-07 DIAGNOSIS — D649 Anemia, unspecified: Secondary | ICD-10-CM | POA: Diagnosis not present

## 2021-05-07 DIAGNOSIS — M1711 Unilateral primary osteoarthritis, right knee: Secondary | ICD-10-CM | POA: Diagnosis not present

## 2021-05-07 DIAGNOSIS — M19071 Primary osteoarthritis, right ankle and foot: Secondary | ICD-10-CM | POA: Diagnosis not present

## 2021-05-07 DIAGNOSIS — I2510 Atherosclerotic heart disease of native coronary artery without angina pectoris: Secondary | ICD-10-CM | POA: Diagnosis not present

## 2021-05-07 DIAGNOSIS — I110 Hypertensive heart disease with heart failure: Secondary | ICD-10-CM | POA: Diagnosis not present

## 2021-05-07 DIAGNOSIS — I509 Heart failure, unspecified: Secondary | ICD-10-CM | POA: Diagnosis not present

## 2021-05-08 ENCOUNTER — Other Ambulatory Visit: Payer: Self-pay

## 2021-05-08 ENCOUNTER — Encounter: Payer: Self-pay | Admitting: Orthopedic Surgery

## 2021-05-08 ENCOUNTER — Ambulatory Visit: Payer: Medicare HMO

## 2021-05-08 ENCOUNTER — Ambulatory Visit (INDEPENDENT_AMBULATORY_CARE_PROVIDER_SITE_OTHER): Payer: Medicare HMO | Admitting: Orthopedic Surgery

## 2021-05-08 VITALS — Ht 61.0 in | Wt 112.0 lb

## 2021-05-08 DIAGNOSIS — S72001D Fracture of unspecified part of neck of right femur, subsequent encounter for closed fracture with routine healing: Secondary | ICD-10-CM

## 2021-05-08 DIAGNOSIS — M25551 Pain in right hip: Secondary | ICD-10-CM

## 2021-05-08 NOTE — Progress Notes (Signed)
Orthopaedic Postop Note  Assessment: Norma Barajas is a 80 y.o. female s/p cephalomedullary nail for right intertrochanteric femur fracture  DOS: 03/24/2021  Plan: Jodell Cipro were previously removed, while she is in rehab facility.  Incisions are healing well. Tylenol as needed for pain Weightbearing as tolerated, recommend using a walker to assist with ambulation. Range of motion as tolerated. Anticipate gradual continued improvement, including with her appetite. No additional intervention needed at this time. Follow-up in 6 weeks.  Follow-up: No follow-ups on file. XR at next visit: AP pelvis, right hip  Subjective:  Chief Complaint  Patient presents with   Routine Post Op    Rt hip DOS 03/24/21    History of Present Illness: Norma Barajas is a 80 y.o. female who presents following the above stated procedure.  She is approximately 6 weeks out from surgery and continues to recover.  She was discharged from the hospital to a rehab facility, but returned home after approximately 2 weeks.  Prior to discharge from the rehab facility, her staples were removed.  She has no pain in the right hip.  She does take Tylenol occasionally.  She is at home working with PT and OT.  She walks with a walker.  No fevers or chills.  No issues with her surgical incisions.  She does continue to have limited appetite.  Review of Systems: No fevers or chills No numbness or tingling No Chest Pain No shortness of breath   Objective: Ht 5\' 1"  (1.549 m)   Wt 112 lb (50.8 kg)   BMI 21.16 kg/m   Physical Exam:  Elderly female.  Seated in wheelchair.  Alert and oriented.  Right lateral hip and leg surgical incisions are healing well without surrounding erythema or drainage.  Able to maintain straight leg raise.  She tolerates gentle range of motion of the hip.  No pain with axial loading.  Active range of motion in the TA/EHL.  Sensation is intact to the right foot.  IMAGING: I personally ordered and  reviewed the following images:  AP pelvis and right hip x-rays were obtained in clinic today and demonstrates excellent alignment of the intertrochanteric femur fracture.  Hardware remains in good position.  No evidence of hardware failure or subsidence.  Fracture remains well reduced.  Impression: Right intertrochanteric femur fracture in excellent alignment without hardware failure.   Mordecai Rasmussen, MD 05/08/2021 1:34 PM

## 2021-05-12 DIAGNOSIS — I110 Hypertensive heart disease with heart failure: Secondary | ICD-10-CM | POA: Diagnosis not present

## 2021-05-12 DIAGNOSIS — I051 Rheumatic mitral insufficiency: Secondary | ICD-10-CM | POA: Diagnosis not present

## 2021-05-12 DIAGNOSIS — M1711 Unilateral primary osteoarthritis, right knee: Secondary | ICD-10-CM | POA: Diagnosis not present

## 2021-05-12 DIAGNOSIS — D649 Anemia, unspecified: Secondary | ICD-10-CM | POA: Diagnosis not present

## 2021-05-12 DIAGNOSIS — I2510 Atherosclerotic heart disease of native coronary artery without angina pectoris: Secondary | ICD-10-CM | POA: Diagnosis not present

## 2021-05-12 DIAGNOSIS — M19071 Primary osteoarthritis, right ankle and foot: Secondary | ICD-10-CM | POA: Diagnosis not present

## 2021-05-12 DIAGNOSIS — I509 Heart failure, unspecified: Secondary | ICD-10-CM | POA: Diagnosis not present

## 2021-05-12 DIAGNOSIS — S72141G Displaced intertrochanteric fracture of right femur, subsequent encounter for closed fracture with delayed healing: Secondary | ICD-10-CM | POA: Diagnosis not present

## 2021-05-12 DIAGNOSIS — I480 Paroxysmal atrial fibrillation: Secondary | ICD-10-CM | POA: Diagnosis not present

## 2021-05-14 DIAGNOSIS — I480 Paroxysmal atrial fibrillation: Secondary | ICD-10-CM | POA: Diagnosis not present

## 2021-05-14 DIAGNOSIS — M19071 Primary osteoarthritis, right ankle and foot: Secondary | ICD-10-CM | POA: Diagnosis not present

## 2021-05-14 DIAGNOSIS — I051 Rheumatic mitral insufficiency: Secondary | ICD-10-CM | POA: Diagnosis not present

## 2021-05-14 DIAGNOSIS — M1711 Unilateral primary osteoarthritis, right knee: Secondary | ICD-10-CM | POA: Diagnosis not present

## 2021-05-14 DIAGNOSIS — I2510 Atherosclerotic heart disease of native coronary artery without angina pectoris: Secondary | ICD-10-CM | POA: Diagnosis not present

## 2021-05-14 DIAGNOSIS — S72141G Displaced intertrochanteric fracture of right femur, subsequent encounter for closed fracture with delayed healing: Secondary | ICD-10-CM | POA: Diagnosis not present

## 2021-05-14 DIAGNOSIS — I110 Hypertensive heart disease with heart failure: Secondary | ICD-10-CM | POA: Diagnosis not present

## 2021-05-14 DIAGNOSIS — D649 Anemia, unspecified: Secondary | ICD-10-CM | POA: Diagnosis not present

## 2021-05-14 DIAGNOSIS — I509 Heart failure, unspecified: Secondary | ICD-10-CM | POA: Diagnosis not present

## 2021-05-15 DIAGNOSIS — I509 Heart failure, unspecified: Secondary | ICD-10-CM | POA: Diagnosis not present

## 2021-05-15 DIAGNOSIS — I110 Hypertensive heart disease with heart failure: Secondary | ICD-10-CM | POA: Diagnosis not present

## 2021-05-15 DIAGNOSIS — S72141G Displaced intertrochanteric fracture of right femur, subsequent encounter for closed fracture with delayed healing: Secondary | ICD-10-CM | POA: Diagnosis not present

## 2021-05-15 DIAGNOSIS — I480 Paroxysmal atrial fibrillation: Secondary | ICD-10-CM | POA: Diagnosis not present

## 2021-05-15 DIAGNOSIS — M1711 Unilateral primary osteoarthritis, right knee: Secondary | ICD-10-CM | POA: Diagnosis not present

## 2021-05-15 DIAGNOSIS — I051 Rheumatic mitral insufficiency: Secondary | ICD-10-CM | POA: Diagnosis not present

## 2021-05-15 DIAGNOSIS — M19071 Primary osteoarthritis, right ankle and foot: Secondary | ICD-10-CM | POA: Diagnosis not present

## 2021-05-15 DIAGNOSIS — D649 Anemia, unspecified: Secondary | ICD-10-CM | POA: Diagnosis not present

## 2021-05-15 DIAGNOSIS — I2510 Atherosclerotic heart disease of native coronary artery without angina pectoris: Secondary | ICD-10-CM | POA: Diagnosis not present

## 2021-05-19 ENCOUNTER — Telehealth: Payer: Self-pay | Admitting: Family Medicine

## 2021-05-19 DIAGNOSIS — D649 Anemia, unspecified: Secondary | ICD-10-CM | POA: Diagnosis not present

## 2021-05-19 DIAGNOSIS — I2510 Atherosclerotic heart disease of native coronary artery without angina pectoris: Secondary | ICD-10-CM | POA: Diagnosis not present

## 2021-05-19 DIAGNOSIS — I110 Hypertensive heart disease with heart failure: Secondary | ICD-10-CM | POA: Diagnosis not present

## 2021-05-19 DIAGNOSIS — S72141G Displaced intertrochanteric fracture of right femur, subsequent encounter for closed fracture with delayed healing: Secondary | ICD-10-CM | POA: Diagnosis not present

## 2021-05-19 DIAGNOSIS — M1711 Unilateral primary osteoarthritis, right knee: Secondary | ICD-10-CM | POA: Diagnosis not present

## 2021-05-19 DIAGNOSIS — I051 Rheumatic mitral insufficiency: Secondary | ICD-10-CM | POA: Diagnosis not present

## 2021-05-19 DIAGNOSIS — M19071 Primary osteoarthritis, right ankle and foot: Secondary | ICD-10-CM | POA: Diagnosis not present

## 2021-05-19 DIAGNOSIS — I509 Heart failure, unspecified: Secondary | ICD-10-CM | POA: Diagnosis not present

## 2021-05-19 DIAGNOSIS — I480 Paroxysmal atrial fibrillation: Secondary | ICD-10-CM | POA: Diagnosis not present

## 2021-05-19 NOTE — Telephone Encounter (Signed)
Vinnie Level called from [place] stating that she had a home visit with pt today and says patients resting heart rate is 125. Also reports that patients weight is up (114 lbs) and says that patient took an extra Torsemide yesterday. Also says that pt is complaining about feeling weak and has some SOB with walking. O2 stats are up (94-95 range).  Can contact Vinnie Level at [phone] if needed.

## 2021-05-20 ENCOUNTER — Telehealth: Payer: Self-pay | Admitting: *Deleted

## 2021-05-20 ENCOUNTER — Telehealth: Payer: Self-pay | Admitting: Cardiology

## 2021-05-20 DIAGNOSIS — M19071 Primary osteoarthritis, right ankle and foot: Secondary | ICD-10-CM | POA: Diagnosis not present

## 2021-05-20 DIAGNOSIS — S72141G Displaced intertrochanteric fracture of right femur, subsequent encounter for closed fracture with delayed healing: Secondary | ICD-10-CM | POA: Diagnosis not present

## 2021-05-20 DIAGNOSIS — I480 Paroxysmal atrial fibrillation: Secondary | ICD-10-CM | POA: Diagnosis not present

## 2021-05-20 DIAGNOSIS — I2510 Atherosclerotic heart disease of native coronary artery without angina pectoris: Secondary | ICD-10-CM | POA: Diagnosis not present

## 2021-05-20 DIAGNOSIS — D649 Anemia, unspecified: Secondary | ICD-10-CM | POA: Diagnosis not present

## 2021-05-20 DIAGNOSIS — I110 Hypertensive heart disease with heart failure: Secondary | ICD-10-CM | POA: Diagnosis not present

## 2021-05-20 DIAGNOSIS — I051 Rheumatic mitral insufficiency: Secondary | ICD-10-CM | POA: Diagnosis not present

## 2021-05-20 DIAGNOSIS — M1711 Unilateral primary osteoarthritis, right knee: Secondary | ICD-10-CM | POA: Diagnosis not present

## 2021-05-20 DIAGNOSIS — I509 Heart failure, unspecified: Secondary | ICD-10-CM | POA: Diagnosis not present

## 2021-05-20 NOTE — Telephone Encounter (Signed)
Addressed in phone call from yesterday. If patient is not going to schedule an appointment with cardiology, she needs to schedule one here.

## 2021-05-20 NOTE — Telephone Encounter (Signed)
Two weeks ago when a call came in I advised that she schedule an appointment with cardiology. This has not been done. Please reinforce this. She can take 1.5 tablets of metoprolol to try to bring her heart rate down.

## 2021-05-20 NOTE — Telephone Encounter (Signed)
Home health called and patient is complaining with SOB on exertion, weak, and fatigue. Patient has had a 7lb wt gain in 1 week. She states patients lung sounds sound unusual. Please review and call patient back at [phone]

## 2021-05-20 NOTE — Telephone Encounter (Signed)
Aware and verbalizes understanding.  

## 2021-05-20 NOTE — Telephone Encounter (Signed)
Pt c/o swelling: STAT is pt has developed SOB within 24 hours  How much weight have you gained and in what time span? 8 lbs in 1 week  If swelling, where is the swelling located?  Bilateral leg/feet swelling  Are you currently taking a fluid pill? Yes  Are you currently SOB? Some SOB for 3 days  Do you have a log of your daily weights (if so, list)? No  Have you gained 3 pounds in a day or 5 pounds in a week?  8 lbs in 1 week, pt's weight is 117   Have you traveled recently? No

## 2021-05-20 NOTE — Telephone Encounter (Signed)
Returned call to pt she states that her weight is increased 8# in a week pt states she is taking torsemide 20mg  daily and all other meds as directed, c/o BILAT LE swelling. Pt states that she think she is in AFIB. Pt c/o DOE. She does not take her BP. Last creatinine is 1.13 on 04-24-21 up from normal 03-30-21. Please advise

## 2021-05-21 DIAGNOSIS — M1711 Unilateral primary osteoarthritis, right knee: Secondary | ICD-10-CM | POA: Diagnosis not present

## 2021-05-21 DIAGNOSIS — M19071 Primary osteoarthritis, right ankle and foot: Secondary | ICD-10-CM | POA: Diagnosis not present

## 2021-05-21 DIAGNOSIS — I2510 Atherosclerotic heart disease of native coronary artery without angina pectoris: Secondary | ICD-10-CM | POA: Diagnosis not present

## 2021-05-21 DIAGNOSIS — I480 Paroxysmal atrial fibrillation: Secondary | ICD-10-CM | POA: Diagnosis not present

## 2021-05-21 DIAGNOSIS — I051 Rheumatic mitral insufficiency: Secondary | ICD-10-CM | POA: Diagnosis not present

## 2021-05-21 DIAGNOSIS — S72141G Displaced intertrochanteric fracture of right femur, subsequent encounter for closed fracture with delayed healing: Secondary | ICD-10-CM | POA: Diagnosis not present

## 2021-05-21 DIAGNOSIS — I509 Heart failure, unspecified: Secondary | ICD-10-CM | POA: Diagnosis not present

## 2021-05-21 DIAGNOSIS — I110 Hypertensive heart disease with heart failure: Secondary | ICD-10-CM | POA: Diagnosis not present

## 2021-05-21 DIAGNOSIS — D649 Anemia, unspecified: Secondary | ICD-10-CM | POA: Diagnosis not present

## 2021-05-21 NOTE — Telephone Encounter (Signed)
This call was taken care of yesterday.

## 2021-05-22 ENCOUNTER — Telehealth: Payer: Self-pay | Admitting: Cardiology

## 2021-05-22 NOTE — Telephone Encounter (Signed)
A user error has taken place: encounter opened in error, closed for administrative reasons.

## 2021-05-25 ENCOUNTER — Ambulatory Visit: Payer: Medicare HMO

## 2021-05-25 NOTE — Telephone Encounter (Signed)
LM2CB 

## 2021-05-25 NOTE — Telephone Encounter (Signed)
Pt is returning a call  

## 2021-05-25 NOTE — Telephone Encounter (Signed)
Per Minus Breeding, MD  She can take an extra 20 mg of Torsemide x 2 days and come in for an EKG to see if she is in atrial fib.   Returned call to pt she states that she feels horrible. She is SOB but this is everyday. Her current rx for her torsemide is 80mg . Informed pt that this is not her current dose she is supposed to take only 20 mg daily and she has taken 2 pills for Friday and Saturday(this would be 80mg  daily). She states that she currently takes 20mg  daily (1/2 tab.). she now states that her rx is 20mg . So I guess she has been taking 40mg  for Friday and Sat. Her BP 114/86 HR 139. She will be in shortly for EKG.

## 2021-05-26 DIAGNOSIS — B351 Tinea unguium: Secondary | ICD-10-CM | POA: Diagnosis not present

## 2021-05-26 DIAGNOSIS — I70203 Unspecified atherosclerosis of native arteries of extremities, bilateral legs: Secondary | ICD-10-CM | POA: Diagnosis not present

## 2021-05-26 DIAGNOSIS — L84 Corns and callosities: Secondary | ICD-10-CM | POA: Diagnosis not present

## 2021-05-26 DIAGNOSIS — M79676 Pain in unspecified toe(s): Secondary | ICD-10-CM | POA: Diagnosis not present

## 2021-05-27 ENCOUNTER — Telehealth: Payer: Self-pay | Admitting: Cardiology

## 2021-05-27 ENCOUNTER — Other Ambulatory Visit: Payer: Self-pay

## 2021-05-27 DIAGNOSIS — S72141G Displaced intertrochanteric fracture of right femur, subsequent encounter for closed fracture with delayed healing: Secondary | ICD-10-CM | POA: Diagnosis not present

## 2021-05-27 DIAGNOSIS — D649 Anemia, unspecified: Secondary | ICD-10-CM | POA: Diagnosis not present

## 2021-05-27 DIAGNOSIS — I480 Paroxysmal atrial fibrillation: Secondary | ICD-10-CM | POA: Diagnosis not present

## 2021-05-27 DIAGNOSIS — M1711 Unilateral primary osteoarthritis, right knee: Secondary | ICD-10-CM | POA: Diagnosis not present

## 2021-05-27 DIAGNOSIS — I2510 Atherosclerotic heart disease of native coronary artery without angina pectoris: Secondary | ICD-10-CM | POA: Diagnosis not present

## 2021-05-27 DIAGNOSIS — I110 Hypertensive heart disease with heart failure: Secondary | ICD-10-CM | POA: Diagnosis not present

## 2021-05-27 DIAGNOSIS — I509 Heart failure, unspecified: Secondary | ICD-10-CM | POA: Diagnosis not present

## 2021-05-27 DIAGNOSIS — I051 Rheumatic mitral insufficiency: Secondary | ICD-10-CM | POA: Diagnosis not present

## 2021-05-27 DIAGNOSIS — M19071 Primary osteoarthritis, right ankle and foot: Secondary | ICD-10-CM | POA: Diagnosis not present

## 2021-05-27 DIAGNOSIS — I5032 Chronic diastolic (congestive) heart failure: Secondary | ICD-10-CM

## 2021-05-27 MED ORDER — TORSEMIDE 20 MG PO TABS: 20.0000 mg | ORAL_TABLET | Freq: Two times a day (BID) | ORAL | 1 refills | Status: DC

## 2021-05-27 MED ORDER — AMLODIPINE BESYLATE 10 MG PO TABS: 5.0000 mg | ORAL_TABLET | Freq: Every day | ORAL | 1 refills | Status: DC

## 2021-05-27 MED ORDER — POTASSIUM CHLORIDE ER 10 MEQ PO TBCR: 20.0000 meq | EXTENDED_RELEASE_TABLET | Freq: Two times a day (BID) | ORAL | 0 refills | Status: DC

## 2021-05-27 NOTE — Telephone Encounter (Signed)
Pt c/o swelling: STAT is pt has developed SOB within 24 hours  If swelling, where is the swelling located?  Feet and ankles /   How much weight have you gained and in what time span? She stated she has gained about 4 pound  in 2 weeks   Have you gained 3 pounds in a day or 5 pounds in a week? No   Do you have a log of your daily weights (if so, list)? No   Are you currently taking a fluid pill? Yes , 20 mg a day   Are you currently SOB? Yes anytime she gets up and is doing something / pt stated she is feeling weak and tired awell   Have you traveled recently? No    Per triage sent message.   Best number  [phone]

## 2021-05-27 NOTE — Telephone Encounter (Signed)
Called patient, advised of message- changed med list.  Ordered blood work. Patient verbalized understanding.

## 2021-05-27 NOTE — Telephone Encounter (Signed)
Called patient, she states that she is having swelling in both her legs. From previous message Dr.Hochrein told her to increase Torsemide to 20 mg twice daily for 2 days, she states the swelling got better with this, but patient states the swelling is back. She states that she is not eating salt (she hardly has any appetite), she states she does elevate her legs and it gets better but then comes back throughout the day. Patient states she has had 4lbs in 1 week, and she is having palpitations, I did advise EKG was needed per last message, but patient states that she can not drive to [place]- I seen she had upcoming appointment with PCP on 08/04. She states that this is closer for her than we are. I asked if she could maybe see them sooner to discuss her other issues. She wanted to see Dr.Hochrein but I advised that he was out of town, there was no openings for sooner appointments and no openings with APP's. Advised I would route a message to MD's covering to get advise on how to proceed.

## 2021-05-28 ENCOUNTER — Telehealth: Payer: Self-pay | Admitting: *Deleted

## 2021-05-28 DIAGNOSIS — S72141G Displaced intertrochanteric fracture of right femur, subsequent encounter for closed fracture with delayed healing: Secondary | ICD-10-CM | POA: Diagnosis not present

## 2021-05-28 DIAGNOSIS — M1711 Unilateral primary osteoarthritis, right knee: Secondary | ICD-10-CM | POA: Diagnosis not present

## 2021-05-28 DIAGNOSIS — I480 Paroxysmal atrial fibrillation: Secondary | ICD-10-CM | POA: Diagnosis not present

## 2021-05-28 DIAGNOSIS — I110 Hypertensive heart disease with heart failure: Secondary | ICD-10-CM | POA: Diagnosis not present

## 2021-05-28 DIAGNOSIS — I2510 Atherosclerotic heart disease of native coronary artery without angina pectoris: Secondary | ICD-10-CM | POA: Diagnosis not present

## 2021-05-28 DIAGNOSIS — D649 Anemia, unspecified: Secondary | ICD-10-CM | POA: Diagnosis not present

## 2021-05-28 DIAGNOSIS — M19071 Primary osteoarthritis, right ankle and foot: Secondary | ICD-10-CM | POA: Diagnosis not present

## 2021-05-28 DIAGNOSIS — I509 Heart failure, unspecified: Secondary | ICD-10-CM | POA: Diagnosis not present

## 2021-05-28 DIAGNOSIS — I051 Rheumatic mitral insufficiency: Secondary | ICD-10-CM | POA: Diagnosis not present

## 2021-05-28 NOTE — Telephone Encounter (Signed)
TC from [place] from [place] checking on medications that have been changed due to her swelling. Read her message from Cardiology on med changes for Torsemide, KDur & Amlodipine, as well as what doses were sent to pharmacy. Also discussed that she has no appetite and is nauseated. Drinking ensure. Her BP today sitting is 92/58 HR still up in the 115-120, told cardiologist she has lost 4 lbs in the last week. [place] nurse said weight today was 114, this has been inconsistent during the day when she weighs, and of course she has the swelling going on as well.

## 2021-05-28 NOTE — Telephone Encounter (Signed)
I don't understand the question. Her medication dosages were changes and new prescriptions were sent to the pharmacy. This was yesterday. She was asked by cardiology to come for an EKG and stated she could not get there. They advised her to come here if needed before her next scheduled appointment. Is she taking her mirtazapine at bedtime? This was for her appetite. In the past, she has not been taking it.

## 2021-05-28 NOTE — Telephone Encounter (Signed)
Norma Barajas with [facility] aware and verbalizes understanding.  States that she is not sure if patient is taking her medication at night.  States she has encouraged her to take it since in the past patient states she was not taking it.

## 2021-06-01 DIAGNOSIS — M19071 Primary osteoarthritis, right ankle and foot: Secondary | ICD-10-CM | POA: Diagnosis not present

## 2021-06-01 DIAGNOSIS — M1711 Unilateral primary osteoarthritis, right knee: Secondary | ICD-10-CM | POA: Diagnosis not present

## 2021-06-01 DIAGNOSIS — D649 Anemia, unspecified: Secondary | ICD-10-CM | POA: Diagnosis not present

## 2021-06-01 DIAGNOSIS — I509 Heart failure, unspecified: Secondary | ICD-10-CM | POA: Diagnosis not present

## 2021-06-01 DIAGNOSIS — I2510 Atherosclerotic heart disease of native coronary artery without angina pectoris: Secondary | ICD-10-CM | POA: Diagnosis not present

## 2021-06-01 DIAGNOSIS — I110 Hypertensive heart disease with heart failure: Secondary | ICD-10-CM | POA: Diagnosis not present

## 2021-06-01 DIAGNOSIS — I480 Paroxysmal atrial fibrillation: Secondary | ICD-10-CM | POA: Diagnosis not present

## 2021-06-01 DIAGNOSIS — S72141G Displaced intertrochanteric fracture of right femur, subsequent encounter for closed fracture with delayed healing: Secondary | ICD-10-CM | POA: Diagnosis not present

## 2021-06-01 DIAGNOSIS — I051 Rheumatic mitral insufficiency: Secondary | ICD-10-CM | POA: Diagnosis not present

## 2021-06-02 ENCOUNTER — Telehealth: Payer: Self-pay | Admitting: *Deleted

## 2021-06-02 NOTE — Telephone Encounter (Signed)
VM from [facility] w/ [facility], Pt's HR was 143 with the pulse ox meter, then she took it manually & it was 110 but irregular. Her weight is down to 112 lb. She does not feel well. She has an appt for 06/09/21.  2nd VM from [place], she found out pt has not taken her amlodipine for about a mos now, she is going to increase her visits to 2 x wk to help get her to feeling better

## 2021-06-02 NOTE — Telephone Encounter (Signed)
Continue to encourage adherence to medications.  I will see her next week.

## 2021-06-03 ENCOUNTER — Other Ambulatory Visit (HOSPITAL_COMMUNITY): Payer: Self-pay | Admitting: Nurse Practitioner

## 2021-06-03 DIAGNOSIS — I480 Paroxysmal atrial fibrillation: Secondary | ICD-10-CM | POA: Diagnosis not present

## 2021-06-03 DIAGNOSIS — D649 Anemia, unspecified: Secondary | ICD-10-CM | POA: Diagnosis not present

## 2021-06-03 DIAGNOSIS — S72141G Displaced intertrochanteric fracture of right femur, subsequent encounter for closed fracture with delayed healing: Secondary | ICD-10-CM | POA: Diagnosis not present

## 2021-06-03 DIAGNOSIS — I051 Rheumatic mitral insufficiency: Secondary | ICD-10-CM | POA: Diagnosis not present

## 2021-06-03 DIAGNOSIS — M1711 Unilateral primary osteoarthritis, right knee: Secondary | ICD-10-CM | POA: Diagnosis not present

## 2021-06-03 DIAGNOSIS — M19071 Primary osteoarthritis, right ankle and foot: Secondary | ICD-10-CM | POA: Diagnosis not present

## 2021-06-03 DIAGNOSIS — I2510 Atherosclerotic heart disease of native coronary artery without angina pectoris: Secondary | ICD-10-CM | POA: Diagnosis not present

## 2021-06-03 DIAGNOSIS — I110 Hypertensive heart disease with heart failure: Secondary | ICD-10-CM | POA: Diagnosis not present

## 2021-06-03 DIAGNOSIS — I509 Heart failure, unspecified: Secondary | ICD-10-CM | POA: Diagnosis not present

## 2021-06-03 NOTE — Telephone Encounter (Signed)
TC to [facility] nurse with recommendations from [facility] and she will see her next week

## 2021-06-05 DIAGNOSIS — I051 Rheumatic mitral insufficiency: Secondary | ICD-10-CM | POA: Diagnosis not present

## 2021-06-05 DIAGNOSIS — I509 Heart failure, unspecified: Secondary | ICD-10-CM | POA: Diagnosis not present

## 2021-06-05 DIAGNOSIS — I480 Paroxysmal atrial fibrillation: Secondary | ICD-10-CM | POA: Diagnosis not present

## 2021-06-05 DIAGNOSIS — I2510 Atherosclerotic heart disease of native coronary artery without angina pectoris: Secondary | ICD-10-CM | POA: Diagnosis not present

## 2021-06-05 DIAGNOSIS — I110 Hypertensive heart disease with heart failure: Secondary | ICD-10-CM | POA: Diagnosis not present

## 2021-06-05 DIAGNOSIS — S72141G Displaced intertrochanteric fracture of right femur, subsequent encounter for closed fracture with delayed healing: Secondary | ICD-10-CM | POA: Diagnosis not present

## 2021-06-05 DIAGNOSIS — D649 Anemia, unspecified: Secondary | ICD-10-CM | POA: Diagnosis not present

## 2021-06-05 DIAGNOSIS — M1711 Unilateral primary osteoarthritis, right knee: Secondary | ICD-10-CM | POA: Diagnosis not present

## 2021-06-05 DIAGNOSIS — M19071 Primary osteoarthritis, right ankle and foot: Secondary | ICD-10-CM | POA: Diagnosis not present

## 2021-06-07 DIAGNOSIS — E860 Dehydration: Secondary | ICD-10-CM | POA: Diagnosis not present

## 2021-06-07 DIAGNOSIS — R609 Edema, unspecified: Secondary | ICD-10-CM | POA: Diagnosis not present

## 2021-06-07 DIAGNOSIS — I4891 Unspecified atrial fibrillation: Secondary | ICD-10-CM | POA: Diagnosis not present

## 2021-06-07 DIAGNOSIS — R000 Tachycardia, unspecified: Secondary | ICD-10-CM | POA: Diagnosis not present

## 2021-06-07 DIAGNOSIS — R5383 Other fatigue: Secondary | ICD-10-CM | POA: Diagnosis not present

## 2021-06-08 ENCOUNTER — Encounter (INDEPENDENT_AMBULATORY_CARE_PROVIDER_SITE_OTHER): Payer: Self-pay

## 2021-06-08 ENCOUNTER — Other Ambulatory Visit (INDEPENDENT_AMBULATORY_CARE_PROVIDER_SITE_OTHER): Payer: Self-pay

## 2021-06-08 DIAGNOSIS — R1310 Dysphagia, unspecified: Secondary | ICD-10-CM

## 2021-06-09 ENCOUNTER — Ambulatory Visit (INDEPENDENT_AMBULATORY_CARE_PROVIDER_SITE_OTHER): Payer: Medicare HMO | Admitting: Family Medicine

## 2021-06-09 ENCOUNTER — Other Ambulatory Visit: Payer: Self-pay

## 2021-06-09 VITALS — BP 116/92 | HR 110 | Temp 97.9°F | Wt 115.8 lb

## 2021-06-09 DIAGNOSIS — M546 Pain in thoracic spine: Secondary | ICD-10-CM

## 2021-06-09 DIAGNOSIS — I110 Hypertensive heart disease with heart failure: Secondary | ICD-10-CM | POA: Diagnosis not present

## 2021-06-09 DIAGNOSIS — D649 Anemia, unspecified: Secondary | ICD-10-CM | POA: Diagnosis not present

## 2021-06-09 DIAGNOSIS — I051 Rheumatic mitral insufficiency: Secondary | ICD-10-CM | POA: Diagnosis not present

## 2021-06-09 DIAGNOSIS — I480 Paroxysmal atrial fibrillation: Secondary | ICD-10-CM | POA: Diagnosis not present

## 2021-06-09 DIAGNOSIS — R600 Localized edema: Secondary | ICD-10-CM | POA: Diagnosis not present

## 2021-06-09 DIAGNOSIS — I509 Heart failure, unspecified: Secondary | ICD-10-CM | POA: Diagnosis not present

## 2021-06-09 DIAGNOSIS — I2510 Atherosclerotic heart disease of native coronary artery without angina pectoris: Secondary | ICD-10-CM | POA: Diagnosis not present

## 2021-06-09 DIAGNOSIS — R1319 Other dysphagia: Secondary | ICD-10-CM | POA: Diagnosis not present

## 2021-06-09 DIAGNOSIS — M1711 Unilateral primary osteoarthritis, right knee: Secondary | ICD-10-CM | POA: Diagnosis not present

## 2021-06-09 DIAGNOSIS — S72141G Displaced intertrochanteric fracture of right femur, subsequent encounter for closed fracture with delayed healing: Secondary | ICD-10-CM | POA: Diagnosis not present

## 2021-06-09 DIAGNOSIS — M19071 Primary osteoarthritis, right ankle and foot: Secondary | ICD-10-CM | POA: Diagnosis not present

## 2021-06-09 MED ORDER — METOPROLOL TARTRATE 50 MG PO TABS: 75.0000 mg | ORAL_TABLET | Freq: Two times a day (BID) | ORAL | 1 refills | Status: DC

## 2021-06-09 NOTE — Patient Instructions (Signed)
Tylenol 1,000 mg three times daily as needed for pain.  Reminder: please return after mid-September for your flu shot. If you receive this elsewhere, such as your pharmacy, please let us know so we can get this documented in your chart.

## 2021-06-09 NOTE — Patient Instructions (Addendum)
Norma Barajas  06/09/2021     '@PREFPERIOPPHARMACY'$ @   Your procedure is scheduled on  06/16/2021.   Report to [facility] at  [place] [facility]   Call this number if you have problems the morning of surgery:  [phone]   Remember:  Follow the diet and prep instructions given to you by the office.     Take these medicines the morning of surgery with A SIP OF WATER                 amlodipine, metoprolol, prilosec.     Do not wear jewelry, make-up or nail polish.  Do not wear lotions, powders, or perfumes, or deodorant.  Do not shave 48 hours prior to surgery.  Men may shave face and neck.  Do not bring valuables to the hospital.  [facility] is not responsible for any belongings or valuables.  Contacts, dentures or bridgework may not be worn into surgery.  Leave your suitcase in the car.  After surgery it may be brought to your room.  For patients admitted to the hospital, discharge time will be determined by your treatment team.  Patients discharged the day of surgery will not be allowed to drive home and must have someone with them for 24 hours.    Special instructions:   DO NOT smoke tobacco or vape for 24 hours before your procedure.  Please read over the following fact sheets that you were given. Anesthesia Post-op Instructions and Care and Recovery After Surgery      Upper Endoscopy, Adult, Care After This sheet gives you information about how to care for yourself after your procedure. Your health care provider may also give you more specific instructions. If you have problems or questions, contact your health careprovider. What can I expect after the procedure? After the procedure, it is common to have: A sore throat. Mild stomach pain or discomfort. Bloating. Nausea. Follow these instructions at home:  Follow instructions from your health care provider about what to eat or drink after your procedure. Return to your normal activities as told by your health  care provider. Ask your health care provider what activities are safe for you. Take over-the-counter and prescription medicines only as told by your health care provider. If you were given a sedative during the procedure, it can affect you for several hours. Do not drive or operate machinery until your health care provider says that it is safe. Keep all follow-up visits as told by your health care provider. This is important. Contact a health care provider if you have: A sore throat that lasts longer than one day. Trouble swallowing. Get help right away if: You vomit blood or your vomit looks like coffee grounds. You have: A fever. Bloody, black, or tarry stools. A severe sore throat or you cannot swallow. Difficulty breathing. Severe pain in your chest or abdomen. Summary After the procedure, it is common to have a sore throat, mild stomach discomfort, bloating, and nausea. If you were given a sedative during the procedure, it can affect you for several hours. Do not drive or operate machinery until your health care provider says that it is safe. Follow instructions from your health care provider about what to eat or drink after your procedure. Return to your normal activities as told by your health care provider. This information is not intended to replace advice given to you by your health care provider. Make sure you discuss  any questions you have with your healthcare provider. Document Revised: 10/23/2019 Document Reviewed: 03/27/2018 Elsevier Patient Education  2022 [employer]. https://www.asge.org/home/for-patients/patient-information/understanding-eso-dilation-updated">  Esophageal Dilatation Esophageal dilatation, also called esophageal dilation, is a procedure to widen or open a blocked or narrowed part of the esophagus. The esophagus is the part of the body that moves food and liquid from the mouth to the stomach. You may need this procedure if: You have a buildup of scar  tissue in your esophagus that makes it difficult, painful, or impossible to swallow. This can be caused by gastroesophageal reflux disease (GERD). You have cancer of the esophagus. There is a problem with how food moves through your esophagus. In some cases, you may need this procedure repeated at a later time to dilatethe esophagus gradually. Tell a health care provider about: Any allergies you have. All medicines you are taking, including vitamins, herbs, eye drops, creams, and over-the-counter medicines. Any problems you or family members have had with anesthetic medicines. Any blood disorders you have. Any surgeries you have had. Any medical conditions you have. Any antibiotic medicines you are required to take before dental procedures. Whether you are pregnant or may be pregnant. What are the risks? Generally, this is a safe procedure. However, problems may occur, including: Bleeding due to a tear in the lining of the esophagus. A hole, or perforation, in the esophagus. What happens before the procedure? Ask your health care provider about: Changing or stopping your regular medicines. This is especially important if you are taking diabetes medicines or blood thinners. Taking medicines such as aspirin and ibuprofen. These medicines can thin your blood. Do not take these medicines unless your health care provider tells you to take them. Taking over-the-counter medicines, vitamins, herbs, and supplements. Follow instructions from your health care provider about eating or drinking restrictions. Plan to have a responsible adult take you home from the hospital or clinic. Plan to have a responsible adult care for you for the time you are told after you leave the hospital or clinic. This is important. What happens during the procedure? You may be given a medicine to help you relax (sedative). A numbing medicine may be sprayed into the back of your throat, or you may gargle the medicine. Your  health care provider may perform the dilatation using various surgical instruments, such as: Simple dilators. This instrument is carefully placed in the esophagus to stretch it. Guided wire bougies. This involves using an endoscope to insert a wire into the esophagus. A dilator is passed over this wire to enlarge the esophagus. Then the wire is removed. Balloon dilators. An endoscope with a small balloon is inserted into the esophagus. The balloon is inflated to stretch the esophagus and open it up. The procedure may vary among health care providers and hospitals. What can I expect after the procedure? Your blood pressure, heart rate, breathing rate, and blood oxygen level will be monitored until you leave the hospital or clinic. Your throat may feel slightly sore and numb. This will get better over time. You will not be allowed to eat or drink until your throat is no longer numb. When you are able to drink, urinate, and sit on the edge of the bed without nausea or dizziness, you may be able to return home. Follow these instructions at home: Take over-the-counter and prescription medicines only as told by your health care provider. If you were given a sedative during the procedure, it can affect you for several hours. Do not  drive or operate machinery until your health care provider says that it is safe. Plan to have a responsible adult care for you for the time you are told. This is important. Follow instructions from your health care provider about any eating or drinking restrictions. Do not use any products that contain nicotine or tobacco, such as cigarettes, e-cigarettes, and chewing tobacco. If you need help quitting, ask your health care provider. Keep all follow-up visits. This is important. Contact a health care provider if: You have a fever. You have pain that is not relieved by medicine. Get help right away if: You have chest pain. You have trouble breathing. You have trouble  swallowing. You vomit blood. You have black, tarry, or bloody stools. These symptoms may represent a serious problem that is an emergency. Do not wait to see if the symptoms will go away. Get medical help right away. Call your local emergency services (911 in the [place] Do not drive yourself to the hospital. Summary Esophageal dilatation, also called esophageal dilation, is a procedure to widen or open a blocked or narrowed part of the esophagus. Plan to have a responsible adult take you home from the hospital or clinic. For this procedure, a numbing medicine may be sprayed into the back of your throat, or you may gargle the medicine. Do not drive or operate machinery until your health care provider says that it is safe. This information is not intended to replace advice given to you by your health care provider. Make sure you discuss any questions you have with your healthcare provider. Document Revised: 03/12/2020 Document Reviewed: 03/12/2020 Elsevier Patient Education  [place] After This sheet gives you information about how to care for yourself after your procedure. Your health care provider may also give you more specific instructions. If you have problems or questions, contact your health careprovider. What can I expect after the procedure? After the procedure, it is common to have: Tiredness. Forgetfulness about what happened after the procedure. Impaired judgment for important decisions. Nausea or vomiting. Some difficulty with balance. Follow these instructions at home: For the time period you were told by your health care provider:     Rest as needed. Do not participate in activities where you could fall or become injured. Do not drive or use machinery. Do not drink alcohol. Do not take sleeping pills or medicines that cause drowsiness. Do not make important decisions or sign legal documents. Do not take care of children on your  own. Eating and drinking Follow the diet that is recommended by your health care provider. Drink enough fluid to keep your urine pale yellow. If you vomit: Drink water, juice, or soup when you can drink without vomiting. Make sure you have little or no nausea before eating solid foods. General instructions Have a responsible adult stay with you for the time you are told. It is important to have someone help care for you until you are awake and alert. Take over-the-counter and prescription medicines only as told by your health care provider. If you have sleep apnea, surgery and certain medicines can increase your risk for breathing problems. Follow instructions from your health care provider about wearing your sleep device: Anytime you are sleeping, including during daytime naps. While taking prescription pain medicines, sleeping medicines, or medicines that make you drowsy. Avoid smoking. Keep all follow-up visits as told by your health care provider. This is important. Contact a health care provider if: You keep feeling  nauseous or you keep vomiting. You feel light-headed. You are still sleepy or having trouble with balance after 24 hours. You develop a rash. You have a fever. You have redness or swelling around the IV site. Get help right away if: You have trouble breathing. You have new-onset confusion at home. Summary For several hours after your procedure, you may feel tired. You may also be forgetful and have poor judgment. Have a responsible adult stay with you for the time you are told. It is important to have someone help care for you until you are awake and alert. Rest as told. Do not drive or operate machinery. Do not drink alcohol or take sleeping pills. Get help right away if you have trouble breathing, or if you suddenly become confused. This information is not intended to replace advice given to you by your health care provider. Make sure you discuss any questions you  have with your healthcare provider. Document Revised: 07/10/2020 Document Reviewed: 09/27/2019 Elsevier Patient Education  2022 [employer].

## 2021-06-09 NOTE — Progress Notes (Signed)
Assessment & Plan:  1. Paroxysmal atrial fibrillation (HCC) Metoprolol increased from 50 mg to 75 mg twice daily. - EKG 12-Lead - metoprolol tartrate (LOPRESSOR) 50 MG tablet; Take 1.5 tablets (75 mg total) by mouth 2 (two) times daily.  Dispense: 180 tablet; Refill: 1 - CMP14+EGFR - TSH  2. Bilateral lower extremity edema Encouraged elevation of lower legs and compression hose. Discussed importance of potassium supplement and that if her potassium is low I will need to give her a liquid supplement to take. - CMP14+EGFR - TSH  3. Esophageal dysphagia EGD with esophageal dilation scheduled for 06/16/2021.  4. Acute left-sided thoracic back pain Tylenol 1,000 mg TID PRN for pain. Declined anything stronger. Unable to take NSAIDs.   Return in about 2 weeks (around 06/23/2021) for follow-up of chronic medication conditions.  [name], MSN, APRN, FNP-C [facility]  Subjective:    Patient ID: DELLE ANDRZEJEWSKI, female    DOB: [birth year], 80 y.o.   MRN: [id number]  Patient Care Team: Loman Brooklyn, FNP as PCP - General (Family Medicine) Minus Breeding, MD as PCP - Cardiology (Cardiology) [name], MD (Inactive) as Attending Physician (Gastroenterology) Melina Schools, OD as Consulting Physician (Optometry) Sherran Needs, NP as Consulting Physician (Cardiology)   Chief Complaint:  Chief Complaint  Patient presents with   Hypertension   Hyperlipidemia    Check up of chronic medical conditions    Anorexia    Patient states it has been ongoing.     HPI: COILA WARDELL is a 80 y.o. female presenting on 06/09/2021 for Hypertension, Hyperlipidemia (Check up of chronic medical conditions ), and Anorexia (Patient states it has been ongoing. )  Patient is here to follow-up. She has been having [place] come out twice weekly. They have been calling with reports of patient experiencing tachycardia, leg swelling, weakness, and decreased appetite.  Patient reports  she has been taking amlodipine 5 mg once daily and metoprolol 50 mg twice daily as prescribed. She is taking torsemide 40 mg in the morning instead of 20 mg twice daily. She is not taking her potassium supplement at all. She can feel herself in A-Fib and racing. She has had a cardioversion in the past which only briefly converted. She does not wish to do this again. She has taken amiodarone in the past and did not like how it made her feel. She could not afford Tikosyn. Previously treated with Satolol which she failed.   Her legs are swelling more than usual. She is not taking torsemide as prescribed (20 mg BID), but is taking 40 mg daily. She is not taking potassium. Her diet is poor as she is unable to swallow to eat. Her kidney function had declined on lab work in June. She does not wear compression hose.   Patient is unable to eat due to inability to swallow. She is scheduled for an EGD with esophageal dilation on 06/16/2021. This was previously scheduled in February and she cancelled it. She is currently drinking Ensure TID. She states this is why she isn't taking some of her medications, because she cannot swallow them.  New complaints: Her thoracic back area is still bothering her. She does take Tylenol 650 mg occasionally which is somewhat helpful.    Social history:  Relevant past medical, surgical, family and social history reviewed and updated as indicated. Interim medical history since our last visit reviewed.  Allergies and medications reviewed and updated.  DATA REVIEWED: CHART IN  EPIC  ROS: Negative unless specifically indicated above in HPI.    Current Outpatient Medications:    acetaminophen (TYLENOL) 650 MG CR tablet, Take 650 mg by mouth every 6 (six) hours as needed for pain. Displaced intertrochanteric fracture of right femur, Disp: , Rfl:    amLODipine (NORVASC) 10 MG tablet, Take 0.5 tablets (5 mg total) by mouth daily., Disp: 90 tablet, Rfl: 1   aspirin EC 81 MG tablet,  Take 1 tablet (81 mg total) by mouth 2 (two) times daily., Disp: 180 tablet, Rfl: 1   cholecalciferol (VITAMIN D3) 25 MCG (1000 UT) tablet, Take 1,000 Units by mouth daily., Disp: , Rfl:    metoprolol tartrate (LOPRESSOR) 50 MG tablet, TAKE 1 TABLET (50 MG TOTAL) BY MOUTH 2 (TWO) TIMES DAILY. (Patient taking differently: Take 50 mg by mouth 2 (two) times daily.), Disp: 180 tablet, Rfl: 1   Multiple Vitamin (MULTIVITAMIN) capsule, Take 1 capsule by mouth daily., Disp: , Rfl:    Omega-3 Fatty Acids (FISH OIL) 1000 MG CAPS, Take 1,000 mg by mouth 2 (two) times daily. , Disp: , Rfl:    omeprazole (PRILOSEC) 20 MG capsule, TAKE 1 CAPSULE EVERY DAY (Patient taking differently: Take 20 mg by mouth daily.), Disp: 90 capsule, Rfl: 1   potassium chloride (KLOR-CON) 10 MEQ tablet, Take 2 tablets (20 mEq total) by mouth 2 (two) times daily., Disp: 60 tablet, Rfl: 0   simvastatin (ZOCOR) 20 MG tablet, Take 1 tablet (20 mg total) by mouth daily., Disp: 90 tablet, Rfl: 1   torsemide (DEMADEX) 20 MG tablet, Take 1 tablet (20 mg total) by mouth 2 (two) times daily., Disp: 180 tablet, Rfl: 1   predniSONE (STERAPRED UNI-PAK 21 TAB) 10 MG (21) TBPK tablet, As directed x 6 days, Disp: 21 tablet, Rfl: 0   Allergies  Allergen Reactions   Codeine Nausea And Vomiting   Doxycycline Other (See Comments)    Chest congestion   Triamterene-Hctz Other (See Comments)    weakness   Atorvastatin Other (See Comments)    Myalgias    Crestor [Rosuvastatin Calcium] Other (See Comments)    weakness   Morphine Nausea Only   Risedronate Sodium Other (See Comments)    ACTONEL - reflux   Past Medical History:  Diagnosis Date   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 Nonobstructive minimal CAD 10/2005.   Depression    Diastolic dysfunction     Grade 1. Ejection fraction 60-65%.   Dysrhythmia    a fib   Erosive esophagitis    Essential hypertension    GERD (gastroesophageal reflux disease)    Grade III hemorrhoids    History of hiatal hernia    Hypercholesterolemia    Internal hemorrhoids with complication 3/52/4818   OCT 2015 FLEX SIG/IH BANDING     Migraine    "used to have them right bad; I don't now" (04/11/2015)   MITRAL REGURGITATION 04/27/2010   Qualifier: Diagnosis of  By: [name], MD, [name]    Osteoporosis    Paroxysmal atrial fibrillation ([place])    PONV (postoperative nausea and vomiting)    Vertigo     Past Surgical History:  Procedure Laterality Date   BRAVO [facility] STUDY  11/15/2012   Procedure: BRAVO [place];  Surgeon:  [name], MD;  [facility];  Service: Endoscopy;;   CARDIOVERSION N/A 05/16/2020   Procedure: CARDIOVERSION;  Surgeon: Donato Heinz, MD;  Location: [place];  Service: Cardiovascular;  Laterality: N/A;   CATARACT EXTRACTION W/ INTRAOCULAR LENS  IMPLANT, BILATERAL Bilateral    COLONOSCOPY  2008   Dr. [name]: internal hemorrhoids    DILATION AND CURETTAGE OF UTERUS     ESOPHAGOGASTRODUODENOSCOPY (EGD) WITH ESOPHAGEAL DILATION  2001   Dr. 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    FRACTURE SURGERY Right    below the knee - 2 bones broke has plates   HEMORRHOID BANDING N/A 08/22/2014   Procedure: HEMORRHOID BANDING;  Surgeon: [name], MD;  [facility];  Service: Endoscopy;  Laterality: N/A;    HEMORRHOID SURGERY N/A 03/30/2018   Procedure: EXTENSIVE HEMORRHOIDECTOMY;  Surgeon: [name], MD;  Location: [facility];  Service: General;  Laterality: N/A;   INTRAMEDULLARY (IM) NAIL INTERTROCHANTERIC Right 03/24/2021   Procedure: INTRAMEDULLARY (IM) NAIL INTERTROCHANTRIC;  Surgeon: Mordecai Rasmussen, MD;  Location: [facility];  Service: Orthopedics;  Laterality: Right;   OPEN REDUCTION INTERNAL FIXATION (ORIF) TIBIA/FIBULA FRACTURE Right 2013   broke tibia and fibula after falling down stairs   PROLAPSED UTERINE FIBROID LIGATION  2015   TOTAL ABDOMINAL HYSTERECTOMY      Social History   Socioeconomic History   Marital status: Married    Spouse name: george    Number of children: 1   Years of education: Not on file   Highest education level: Not on file  Occupational History   Occupation: Retired    Comment: Textile  Tobacco Use   Smoking status: Never   Smokeless tobacco: Never  Scientific laboratory technician Use: Never used  Substance and Sexual Activity   Alcohol use: No    Alcohol/week: 0.0 standard drinks   Drug use: No   Sexual activity: Not Currently    Birth control/protection: Surgical, Post-menopausal    Comment: hyst  Other Topics Concern   Not on file  Social History Narrative   Married   No regular exercise   Social Determinants of Health   Financial Resource Strain: Not on file  Food Insecurity: Not on file  Transportation Needs: Not on file  Physical Activity: Not on file  Stress: Not on file  Social Connections: Not on file  Intimate Partner Violence: Not on file        Objective:    BP (!) 116/92   Pulse (!) 110   Temp 97.9 F (36.6 C) (Temporal)   Wt 115 lb 12.8 oz (52.5 kg)   SpO2 94%   BMI 21.88 kg/m   Wt Readings from Last 3 Encounters:  06/09/21 115 lb 12.8 oz (52.5 kg)  05/08/21 112 lb (50.8 kg)  04/30/21 112 lb (50.8 kg)    Physical Exam Vitals reviewed.  Constitutional:      General: She is not in acute distress.    Appearance:  Normal appearance. She is not ill-appearing, toxic-appearing or diaphoretic.  HENT:     Head: Normocephalic and atraumatic.  Eyes:     General: No scleral icterus.       Right eye: No discharge.        Left eye: No discharge.     Conjunctiva/sclera: Conjunctivae normal.  Cardiovascular:     Rate and Rhythm: Tachycardia present. Rhythm irregular.     Heart sounds: Normal heart sounds. No murmur heard.   No friction rub. No gallop.  Pulmonary:     Effort: Pulmonary effort is normal. No respiratory distress.     Breath sounds: Normal breath sounds. No stridor. No wheezing, rhonchi or rales.  Musculoskeletal:        General: Normal range of motion.     Cervical back: Normal range of motion.     Right lower leg: 2+ Edema present.     Left lower leg: 2+ Edema present.  Skin:    General: Skin is warm and dry.     Capillary Refill: Capillary refill takes less than 2 seconds.  Neurological:     General: No focal deficit present.     Mental Status: She is alert and oriented to person, place, and time. Mental status is at baseline.     Gait: Gait abnormal (riding in [facility]).  Psychiatric:        Mood and Affect: Mood normal.        Behavior: Behavior normal.        Thought Content: Thought content normal.        Judgment: Judgment normal.    EKG: A-Fib; rate of 119  Lab Results  Component Value Date   TSH 0.805 05/20/2020   Lab Results  Component Value Date   WBC 7.3 04/24/2021   HGB 12.6 04/24/2021   HCT 38.3 04/24/2021   MCV 95 04/24/2021   PLT 187 04/24/2021   Lab Results  Component Value Date   NA 141 04/24/2021   K 3.6 04/24/2021   CO2 19 (L) 04/24/2021   GLUCOSE 118 (H) 04/24/2021   BUN 20 04/24/2021   CREATININE 1.13 (H) 04/24/2021   BILITOT 0.4 04/24/2021   ALKPHOS 134 (H) 04/24/2021   AST 14 04/24/2021   ALT 12 04/24/2021   PROT 6.5 04/24/2021   ALBUMIN 3.9 04/24/2021   CALCIUM 8.6 (L) 04/24/2021   ANIONGAP 8 03/30/2021   EGFR 49 (L) 04/24/2021   Lab  Results  Component Value Date   CHOL 164 01/22/2021   Lab Results  Component Value Date   HDL 49 01/22/2021   Lab Results  Component Value Date   LDLCALC 90 01/22/2021   Lab Results  Component Value Date   TRIG 142 01/22/2021   Lab Results  Component Value Date   CHOLHDL 3.3 01/22/2021   Lab Results  Component Value Date   HGBA1C 5.6 02/28/2017

## 2021-06-10 LAB — CMP14+EGFR
ALT: 12 IU/L (ref 0–32)
AST: 18 IU/L (ref 0–40)
Albumin/Globulin Ratio: 1.5 (ref 1.2–2.2)
Albumin: 3.8 g/dL (ref 3.6–4.6)
Alkaline Phosphatase: 85 IU/L (ref 44–121)
BUN/Creatinine Ratio: 23 (ref 12–28)
BUN: 29 mg/dL — ABNORMAL HIGH (ref 8–27)
Bilirubin Total: 0.5 mg/dL (ref 0.0–1.2)
CO2: 29 mmol/L (ref 20–29)
Calcium: 9.1 mg/dL (ref 8.7–10.3)
Chloride: 93 mmol/L — ABNORMAL LOW (ref 96–106)
Creatinine, Ser: 1.25 mg/dL — ABNORMAL HIGH (ref 0.57–1.00)
Globulin, Total: 2.5 g/dL (ref 1.5–4.5)
Glucose: 115 mg/dL — ABNORMAL HIGH (ref 65–99)
Potassium: 3.5 mmol/L (ref 3.5–5.2)
Sodium: 140 mmol/L (ref 134–144)
Total Protein: 6.3 g/dL (ref 6.0–8.5)
eGFR: 43 mL/min/{1.73_m2} — ABNORMAL LOW (ref >59)

## 2021-06-10 LAB — TSH: TSH: 2.54 u[IU]/mL (ref 0.450–4.500)

## 2021-06-10 NOTE — Progress Notes (Signed)
R/C about labs   

## 2021-06-11 ENCOUNTER — Ambulatory Visit: Payer: Medicare HMO | Admitting: Family Medicine

## 2021-06-12 ENCOUNTER — Other Ambulatory Visit: Payer: Self-pay

## 2021-06-12 ENCOUNTER — Other Ambulatory Visit (INDEPENDENT_AMBULATORY_CARE_PROVIDER_SITE_OTHER): Payer: Self-pay | Admitting: Gastroenterology

## 2021-06-12 ENCOUNTER — Encounter (HOSPITAL_COMMUNITY): Payer: Self-pay

## 2021-06-12 ENCOUNTER — Encounter (HOSPITAL_COMMUNITY)
Admission: RE | Admit: 2021-06-12 | Discharge: 2021-06-12 | Disposition: A | Discharge status: Home / Self Care | Payer: Medicare HMO | Source: Ambulatory Visit | Attending: Gastroenterology | Admitting: Gastroenterology

## 2021-06-12 DIAGNOSIS — E876 Hypokalemia: Secondary | ICD-10-CM

## 2021-06-12 DIAGNOSIS — Z01812 Encounter for preprocedural laboratory examination: Secondary | ICD-10-CM | POA: Insufficient documentation

## 2021-06-12 DIAGNOSIS — R1310 Dysphagia, unspecified: Secondary | ICD-10-CM

## 2021-06-12 LAB — BASIC METABOLIC PANEL
Anion gap: 12 (ref 5–15)
BUN: 27 mg/dL — ABNORMAL HIGH (ref 8–23)
CO2: 32 mmol/L (ref 22–32)
Calcium: 8.7 mg/dL — ABNORMAL LOW (ref 8.9–10.3)
Chloride: 92 mmol/L — ABNORMAL LOW (ref 98–111)
Creatinine, Ser: 1.05 mg/dL — ABNORMAL HIGH (ref 0.44–1.00)
GFR, Estimated: 53 mL/min — ABNORMAL LOW (ref >60)
Glucose, Bld: 143 mg/dL — ABNORMAL HIGH (ref 70–99)
Potassium: 2.8 mmol/L — ABNORMAL LOW (ref 3.5–5.1)
Sodium: 136 mmol/L (ref 135–145)

## 2021-06-12 MED ORDER — POTASSIUM CHLORIDE 20 MEQ PO PACK: 60.0000 meq | PACK | Freq: Every day | ORAL | 0 refills | Status: DC

## 2021-06-12 NOTE — Progress Notes (Signed)
Dr Jenetta Downer and Berk[place] notified or K 2.8

## 2021-06-13 DIAGNOSIS — I4891 Unspecified atrial fibrillation: Secondary | ICD-10-CM | POA: Diagnosis not present

## 2021-06-13 DIAGNOSIS — R000 Tachycardia, unspecified: Secondary | ICD-10-CM | POA: Diagnosis not present

## 2021-06-13 DIAGNOSIS — R0902 Hypoxemia: Secondary | ICD-10-CM | POA: Diagnosis not present

## 2021-06-13 DIAGNOSIS — R531 Weakness: Secondary | ICD-10-CM | POA: Diagnosis not present

## 2021-06-15 ENCOUNTER — Other Ambulatory Visit (INDEPENDENT_AMBULATORY_CARE_PROVIDER_SITE_OTHER): Payer: Self-pay

## 2021-06-15 DIAGNOSIS — E876 Hypokalemia: Secondary | ICD-10-CM

## 2021-06-16 ENCOUNTER — Encounter (HOSPITAL_COMMUNITY): Payer: Self-pay | Admitting: Gastroenterology

## 2021-06-16 ENCOUNTER — Encounter (HOSPITAL_COMMUNITY)
Admission: RE | Disposition: A | Discharge status: Home / Self Care | Payer: Self-pay | Source: Ambulatory Visit | Attending: Gastroenterology

## 2021-06-16 ENCOUNTER — Ambulatory Visit (HOSPITAL_COMMUNITY)
Admission: RE | Admit: 2021-06-16 | Discharge: 2021-06-16 | Disposition: A | Discharge status: Home / Self Care | Payer: Medicare HMO | Source: Ambulatory Visit | Attending: Gastroenterology | Admitting: Gastroenterology

## 2021-06-16 ENCOUNTER — Ambulatory Visit (HOSPITAL_COMMUNITY): Payer: Medicare HMO | Admitting: Anesthesiology

## 2021-06-16 ENCOUNTER — Other Ambulatory Visit: Payer: Self-pay

## 2021-06-16 DIAGNOSIS — Z8049 Family history of malignant neoplasm of other genital organs: Secondary | ICD-10-CM | POA: Diagnosis not present

## 2021-06-16 DIAGNOSIS — Z888 Allergy status to other drugs, medicaments and biological substances status: Secondary | ICD-10-CM | POA: Diagnosis not present

## 2021-06-16 DIAGNOSIS — Z7982 Long term (current) use of aspirin: Secondary | ICD-10-CM | POA: Insufficient documentation

## 2021-06-16 DIAGNOSIS — I110 Hypertensive heart disease with heart failure: Secondary | ICD-10-CM | POA: Diagnosis not present

## 2021-06-16 DIAGNOSIS — Z823 Family history of stroke: Secondary | ICD-10-CM | POA: Insufficient documentation

## 2021-06-16 DIAGNOSIS — R1310 Dysphagia, unspecified: Secondary | ICD-10-CM

## 2021-06-16 DIAGNOSIS — Z79899 Other long term (current) drug therapy: Secondary | ICD-10-CM | POA: Insufficient documentation

## 2021-06-16 DIAGNOSIS — I480 Paroxysmal atrial fibrillation: Secondary | ICD-10-CM | POA: Insufficient documentation

## 2021-06-16 DIAGNOSIS — E876 Hypokalemia: Secondary | ICD-10-CM

## 2021-06-16 DIAGNOSIS — Z8261 Family history of arthritis: Secondary | ICD-10-CM | POA: Insufficient documentation

## 2021-06-16 DIAGNOSIS — Z8249 Family history of ischemic heart disease and other diseases of the circulatory system: Secondary | ICD-10-CM | POA: Insufficient documentation

## 2021-06-16 DIAGNOSIS — K219 Gastro-esophageal reflux disease without esophagitis: Secondary | ICD-10-CM | POA: Diagnosis not present

## 2021-06-16 DIAGNOSIS — E785 Hyperlipidemia, unspecified: Secondary | ICD-10-CM | POA: Diagnosis not present

## 2021-06-16 DIAGNOSIS — Z8262 Family history of osteoporosis: Secondary | ICD-10-CM | POA: Diagnosis not present

## 2021-06-16 DIAGNOSIS — Z885 Allergy status to narcotic agent status: Secondary | ICD-10-CM | POA: Insufficient documentation

## 2021-06-16 DIAGNOSIS — I5032 Chronic diastolic (congestive) heart failure: Secondary | ICD-10-CM | POA: Diagnosis not present

## 2021-06-16 DIAGNOSIS — Z833 Family history of diabetes mellitus: Secondary | ICD-10-CM | POA: Diagnosis not present

## 2021-06-16 DIAGNOSIS — Z881 Allergy status to other antibiotic agents status: Secondary | ICD-10-CM | POA: Insufficient documentation

## 2021-06-16 DIAGNOSIS — Z8349 Family history of other endocrine, nutritional and metabolic diseases: Secondary | ICD-10-CM | POA: Diagnosis not present

## 2021-06-16 DIAGNOSIS — I340 Nonrheumatic mitral (valve) insufficiency: Secondary | ICD-10-CM | POA: Diagnosis not present

## 2021-06-16 HISTORY — PX: ESOPHAGEAL DILATION: SHX303

## 2021-06-16 HISTORY — PX: ESOPHAGOGASTRODUODENOSCOPY (EGD) WITH PROPOFOL: SHX5813

## 2021-06-16 LAB — POCT I-STAT, CHEM 8
BUN: 26 mg/dL — ABNORMAL HIGH (ref 8–23)
Calcium, Ion: 0.99 mmol/L — ABNORMAL LOW (ref 1.15–1.40)
Chloride: 92 mmol/L — ABNORMAL LOW (ref 98–111)
Creatinine, Ser: 1.1 mg/dL — ABNORMAL HIGH (ref 0.44–1.00)
Glucose, Bld: 112 mg/dL — ABNORMAL HIGH (ref 70–99)
HCT: 55 % — ABNORMAL HIGH (ref 36.0–46.0)
Hemoglobin: 18.7 g/dL — ABNORMAL HIGH (ref 12.0–15.0)
Potassium: 4.3 mmol/L (ref 3.5–5.1)
Sodium: 135 mmol/L (ref 135–145)
TCO2: 30 mmol/L (ref 22–32)

## 2021-06-16 SURGERY — ESOPHAGOGASTRODUODENOSCOPY (EGD) WITH PROPOFOL
Anesthesia: General

## 2021-06-16 MED ORDER — PHENYLEPHRINE 40 MCG/ML (10ML) SYRINGE FOR IV PUSH (FOR BLOOD PRESSURE SUPPORT)
PREFILLED_SYRINGE | INTRAVENOUS | Status: AC
  Filled 2021-06-16: qty 10

## 2021-06-16 MED ORDER — MINERAL OIL PO OIL
TOPICAL_OIL | ORAL | Status: AC
  Filled 2021-06-16: qty 30

## 2021-06-16 MED ORDER — PHENYLEPHRINE 40 MCG/ML (10ML) SYRINGE FOR IV PUSH (FOR BLOOD PRESSURE SUPPORT)
PREFILLED_SYRINGE | INTRAVENOUS | Status: DC | PRN
  Administered 2021-06-16 (×5): 80 ug via INTRAVENOUS
  Administered 2021-06-16: 120 ug via INTRAVENOUS

## 2021-06-16 MED ORDER — IPRATROPIUM-ALBUTEROL 0.5-2.5 (3) MG/3ML IN SOLN
3.0000 mL | Freq: Once | RESPIRATORY_TRACT | Status: AC
  Administered 2021-06-16: 3 mL via RESPIRATORY_TRACT

## 2021-06-16 MED ORDER — STERILE WATER FOR IRRIGATION IR SOLN
Status: DC | PRN
  Administered 2021-06-16: 100 mL

## 2021-06-16 MED ORDER — SODIUM CHLORIDE 0.9 % IV SOLN: INTRAVENOUS | Status: DC

## 2021-06-16 MED ORDER — FUROSEMIDE 10 MG/ML IJ SOLN
INTRAMUSCULAR | Status: AC
  Filled 2021-06-16: qty 4

## 2021-06-16 MED ORDER — LACTATED RINGERS IV SOLN: INTRAVENOUS | Status: DC

## 2021-06-16 MED ORDER — METOPROLOL TARTRATE 5 MG/5ML IV SOLN
INTRAVENOUS | Status: AC
  Filled 2021-06-16: qty 5

## 2021-06-16 MED ORDER — IPRATROPIUM-ALBUTEROL 0.5-2.5 (3) MG/3ML IN SOLN
RESPIRATORY_TRACT | Status: AC
  Filled 2021-06-16: qty 3

## 2021-06-16 MED ORDER — PROPOFOL 10 MG/ML IV BOLUS
INTRAVENOUS | Status: DC | PRN
  Administered 2021-06-16: 20 mg via INTRAVENOUS
  Administered 2021-06-16: 80 mg via INTRAVENOUS

## 2021-06-16 MED ORDER — METOPROLOL TARTRATE 5 MG/5ML IV SOLN
5.0000 mg | Freq: Once | INTRAVENOUS | Status: AC
  Administered 2021-06-16: 5 mg via INTRAVENOUS

## 2021-06-16 MED ORDER — IPRATROPIUM-ALBUTEROL 0.5-2.5 (3) MG/3ML IN SOLN: 3.0000 mL | RESPIRATORY_TRACT | Status: DC

## 2021-06-16 MED ORDER — LIDOCAINE HCL (CARDIAC) PF 100 MG/5ML IV SOSY
PREFILLED_SYRINGE | INTRAVENOUS | Status: DC | PRN
  Administered 2021-06-16: 50 mg via INTRAVENOUS

## 2021-06-16 MED ORDER — FUROSEMIDE 10 MG/ML IJ SOLN
10.0000 mg | Freq: Once | INTRAMUSCULAR | Status: AC
  Administered 2021-06-16: 10 mg via INTRAVENOUS

## 2021-06-16 NOTE — Transfer of Care (Signed)
Immediate Anesthesia Transfer of Care Note  Patient: Norma Barajas  Procedure(s) Performed: ESOPHAGOGASTRODUODENOSCOPY (EGD) WITH PROPOFOL ESOPHAGEAL DILATION  Patient Location: PACU  Anesthesia Type:General  Level of Consciousness: drowsy  Airway & Oxygen Therapy: Patient Spontanous Breathing and Patient connected to nasal cannula oxygen  Post-op Assessment: Report given to RN and Post -op Vital signs reviewed and stable  Post vital signs: Reviewed and stable  Last Vitals:  Vitals Value Taken Time  BP    Temp    Pulse 98 06/16/21 0924  Resp 24 06/16/21 0924  SpO2 94 % 06/16/21 0924  Vitals shown include unvalidated device data.  Last Pain:  Vitals:   06/16/21 0907  TempSrc:   PainSc: 0-No pain         Complications: No notable events documented.

## 2021-06-16 NOTE — Op Note (Signed)
[facility] Patient Name: Norma Barajas Procedure Date: 06/16/2021 8:55 AM MRN: [id number] Date of Birth: 07/26/1941 Attending MD: Maylon Peppers ,  CSN: [id number] Age: 80 Admit Type: Outpatient Procedure:                Upper GI endoscopy Indications:              Dysphagia Providers:                Maylon Peppers, Lambert Mody Raphael Gibney,                            Technician Referring MD:              Medicines:                Monitored Anesthesia Care Complications:            No immediate complications. Estimated Blood Loss:     Estimated blood loss: none. Procedure:                Pre-Anesthesia Assessment:                           - Prior to the procedure, a History and Physical                            was performed, and patient medications, allergies                            and sensitivities were reviewed. The patient's                            tolerance of previous anesthesia was reviewed.                           - The risks and benefits of the procedure and the                            sedation options and risks were discussed with the                            patient. All questions were answered and informed                            consent was obtained.                           - ASA Grade Assessment: III - A patient with severe                            systemic disease.                           After obtaining informed consent, the endoscope was                            passed under direct vision. Throughout the  procedure, the patient's blood pressure, pulse, and                            oxygen saturations were monitored continuously. The                            [id number] scope was introduced through the                            mouth, and advanced to the second part of duodenum.                            The upper GI endoscopy was accomplished without                            difficulty.  The patient tolerated the procedure                            well. Scope In: 9:07:17 AM Scope Out: 9:17:25 AM Total Procedure Duration: 0 hours 10 minutes 8 seconds  Findings:      No endoscopic abnormality was evident in the esophagus to explain the       patient's complaint of dysphagia. It was decided, however, to proceed       with dilation of the entire esophagus. A guidewire was placed and the       scope was withdrawn. Dilation was performed with a Savary dilator with       mild resistance at 18 mm. There was presence of a mucosal break and heme       at the upper esophageal sphincter area, likely related to an esophageal       web/muscle bar.      The entire examined stomach was normal.      The examined duodenum was normal. Impression:               - No endoscopic esophageal abnormality to explain                            patient's dysphagia. Esophagus dilated. Dilated.                           - Normal stomach.                           - Normal examined duodenum.                           - No specimens collected. Moderate Sedation:      Per Anesthesia Care Recommendation:           - Discharge patient to home (ambulatory).                           - Advance diet as tolerated.                           - May consider esophageal manometry if presenting  persisting symptoms in 2 weeks. Procedure Code(s):        --- Professional ---                           [phone], Esophagogastroduodenoscopy, flexible,                            transoral; with insertion of guide wire followed by                            passage of dilator(s) through esophagus over guide                            wire Diagnosis Code(s):        --- Professional ---                           R13.10, Dysphagia, unspecified CPT copyright 2019 American Medical Association. All rights reserved. The codes documented in this report are preliminary and upon coder review may  be  revised to meet current compliance requirements. Maylon Peppers, MD Maylon Peppers,  06/16/2021 9:24:11 AM This report has been signed electronically. Number of Addenda: 0

## 2021-06-16 NOTE — Discharge Instructions (Signed)
You are being discharged to home.  Advance your diet as tolerated.  Please let us know if your swallowing is not better in 2 weeks.

## 2021-06-16 NOTE — Anesthesia Postprocedure Evaluation (Signed)
Anesthesia Post Note  Patient: Norma Barajas  Procedure(s) Performed: ESOPHAGOGASTRODUODENOSCOPY (EGD) WITH PROPOFOL ESOPHAGEAL DILATION  Patient location during evaluation: PACU Anesthesia Type: General Level of consciousness: awake and alert Pain management: pain level controlled Vital Signs Assessment: post-procedure vital signs reviewed and stable Respiratory status: spontaneous breathing, nonlabored ventilation, respiratory function stable and patient connected to nasal cannula oxygen Cardiovascular status: blood pressure returned to baseline and stable Postop Assessment: no apparent nausea or vomiting Anesthetic complications: no   No notable events documented.   Last Vitals:  Vitals:   06/16/21 1052 06/16/21 1100  BP: (!) 118/98 (!) 117/96  Pulse: (!) 120   Resp: (!) 22   Temp: (!) 36.3 C   SpO2: 93% 94%    Last Pain:  Vitals:   06/16/21 1052  TempSrc: Oral  PainSc: 0-No pain                 Nicanor Alcon

## 2021-06-16 NOTE — Anesthesia Preprocedure Evaluation (Signed)
Anesthesia Evaluation  Patient identified by MRN, date of birth, ID band Patient awake    Reviewed: Allergy & Precautions, H&P , NPO status , Patient's Chart, lab work & pertinent test results, reviewed documented beta blocker date and time   History of Anesthesia Complications (+) PONV, PROLONGED EMERGENCE and history of anesthetic complications  Airway Mallampati: II  TM Distance: >3 FB Neck ROM: full    Dental no notable dental hx.    Pulmonary neg pulmonary ROS,    Pulmonary exam normal breath sounds clear to auscultation       Cardiovascular Exercise Tolerance: Good hypertension, + CAD and +CHF  + dysrhythmias Atrial Fibrillation  Rhythm:regular Rate:Normal     Neuro/Psych  Headaches, PSYCHIATRIC DISORDERS Anxiety Depression  Neuromuscular disease    GI/Hepatic Neg liver ROS, hiatal hernia, PUD, GERD  Medicated,  Endo/Other  negative endocrine ROS  Renal/GU negative Renal ROS  negative genitourinary   Musculoskeletal  (+) Arthritis ,   Abdominal   Peds negative pediatric ROS (+)  Hematology  (+) anemia ,   Anesthesia Other Findings   Reproductive/Obstetrics negative OB ROS                             Anesthesia Physical  Anesthesia Plan  ASA: 3  Anesthesia Plan: General and General LMA   Post-op Pain Management:    Induction:   PONV Risk Score and Plan: Ondansetron  Airway Management Planned:   Additional Equipment:   Intra-op Plan:   Post-operative Plan:   Informed Consent: I have reviewed the patients History and Physical, chart, labs and discussed the procedure including the risks, benefits and alternatives for the proposed anesthesia with the patient or authorized representative who has indicated his/her understanding and acceptance.     Dental Advisory Given  Plan Discussed with: CRNA  Anesthesia Plan Comments: (Afib this am)        Anesthesia  Quick Evaluation

## 2021-06-16 NOTE — Anesthesia Procedure Notes (Signed)
Date/Time: 06/16/2021 9:11 AM Performed by: Orlie Dakin, CRNA Pre-anesthesia Checklist: Patient identified, Emergency Drugs available, Suction available and Patient being monitored Patient Re-evaluated:Patient Re-evaluated prior to induction Oxygen Delivery Method: Nasal cannula Induction Type: IV induction Placement Confirmation: positive ETCO2

## 2021-06-16 NOTE — H&P (Signed)
Norma Barajas is an 80 y.o. female.   Chief Complaint: Dysphagia HPI: 80 year old female with past medical history of acoustic neuroma, anxiety, coronary artery disease, heart failure with diastolic dysfunction, depression, atrial fibrillation, GERD, hypertension, hyperlipidemia, mitral regurgitation, who comes to the hospital for evaluation of dysphagia.  The patient has presented chronic symptoms of dysphagia that have been evaluated in the past with  a barium esophagram.  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  Past Medical History:  Diagnosis Date   Acoustic neuroma ([place]) 02/18/2011   Right ear    Anxiety    Arthritis    "right leg" (04/11/2015)   BPPV (benign paroxysmal positional vertigo) 03/23/2016   Cataract    Coronary atherosclerosis of native coronary artery    a. Nonobstructive minimal CAD 10/2005.   Depression    Diastolic dysfunction    Grade 1. Ejection fraction 60-65%.   Dysrhythmia    a fib   Erosive esophagitis    Essential hypertension    GERD (gastroesophageal reflux disease)    Grade III hemorrhoids    History of hiatal hernia    Hypercholesterolemia    Internal hemorrhoids with complication [id number]   OCT 2015 FLEX SIG/IH BANDING     Migraine    "used to have them right bad; I don't now" (04/11/2015)   MITRAL REGURGITATION 04/27/2010   Qualifier: Diagnosis of  By: [name], MD, [name]    Osteoporosis    Paroxysmal atrial fibrillation ([place])    PONV (postoperative nausea and vomiting)    Vertigo     Past Surgical History:  Procedure Laterality Date   BRAVO [facility] STUDY  11/15/2012   Procedure: BRAVO [place];   Surgeon: [name], MD;  [facility];  Service: Endoscopy;;   CARDIOVERSION N/A 05/16/2020   Procedure: CARDIOVERSION;  Surgeon: Donato Heinz, MD;  Location: [place];  Service: Cardiovascular;  Laterality: N/A;   CATARACT EXTRACTION W/ INTRAOCULAR LENS  IMPLANT, BILATERAL Bilateral    COLONOSCOPY  2008   Dr. [name]: internal hemorrhoids    DILATION AND CURETTAGE OF UTERUS     ESOPHAGOGASTRODUODENOSCOPY (EGD) WITH ESOPHAGEAL DILATION  2001   Dr. [name]: erosive esophagitis, esophageal stricture, duodenitis, s/p Savary dilation   ESOPHAGOGASTRODUODENOSCOPY (EGD) WITH ESOPHAGEAL DILATION  11/15/2012   [id number] web was found & MOST LIKELY CAUSE FOR DYAPHAGIA/Polyp was found in the gastric body and gastric fundus/ gastritis on bx   EYE SURGERY Bilateral    "laser OR after cataract OR; cause I couldn't see"   FLEXIBLE SIGMOIDOSCOPY N/A 08/22/2014   mild diverticulosis in sigmoid, moderate sized Grade 3 hemorrhoids s/p banding X 3.    FRACTURE SURGERY Right    below the knee - 2 bones broke has plates   HEMORRHOID BANDING N/A 08/22/2014   Procedure: HEMORRHOID BANDING;  Surgeon: [name], MD;  [facility];  Service: Endoscopy;  Laterality: N/A;   HEMORRHOID SURGERY N/A 03/30/2018   Procedure: EXTENSIVE HEMORRHOIDECTOMY;  Surgeon: [name], MD;  Location: [facility];  Service: General;  Laterality: N/A;   INTRAMEDULLARY (IM) NAIL INTERTROCHANTERIC Right 03/24/2021   Procedure: INTRAMEDULLARY (IM) NAIL INTERTROCHANTRIC;  Surgeon: Mordecai Rasmussen, MD;  Location: [facility];  Service: Orthopedics;  Laterality: Right;   OPEN REDUCTION INTERNAL FIXATION (ORIF) TIBIA/FIBULA FRACTURE Right 2013   broke tibia and fibula after falling down stairs   PROLAPSED UTERINE FIBROID LIGATION  2015   TOTAL ABDOMINAL HYSTERECTOMY      Family History  Problem Relation Age of Onset   Colon cancer Mother 31   Heart disease Mother    Osteoporosis Mother    Hip  fracture Mother    Stroke Sister    Diabetes Sister    Osteoporosis Sister    Arthritis Sister    Uterine cancer Sister    Stroke Sister    Heart disease Father    Hyperlipidemia Brother    Hypertension Brother    Heart disease Brother    Stroke Brother    Heart disease Sister    Dementia Sister    Diabetes Son    Stroke Son    Heart attack Neg Hx    Social History:  reports that she has never smoked. She has never used smokeless tobacco. She reports that she does not drink alcohol and does not use drugs.  Allergies:  Allergies  Allergen Reactions   Codeine Nausea And Vomiting   Doxycycline Other (See Comments)    Chest congestion   Triamterene-Hctz Other (See Comments)    weakness   Atorvastatin Other (See Comments)    Myalgias    Crestor [Rosuvastatin Calcium] Other (See Comments)    weakness   Morphine Nausea Only   Risedronate Sodium Other (See Comments)    ACTONEL - reflux    Medications Prior to Admission  Medication Sig Dispense Refill   acetaminophen (TYLENOL) 650 MG CR tablet Take 650 mg by mouth every 6 (six) hours as needed for pain. Displaced intertrochanteric fracture of right femur     amLODipine (NORVASC) 10 MG tablet Take 0.5 tablets (5 mg total) by mouth daily. 90 tablet 1   aspirin EC 81 MG tablet Take 1 tablet (81 mg total) by mouth 2 (two) times daily. 180 tablet 1   metoprolol tartrate (LOPRESSOR) 50 MG tablet Take 1.5 tablets (75 mg total) by mouth 2 (two) times daily. 180 tablet 1   omeprazole (PRILOSEC) 20 MG capsule Take 20 mg by mouth daily.     potassium chloride (KLOR-CON) 10 MEQ tablet Take 2 tablets (20 mEq total) by mouth 2 (two) times daily. 60 tablet 0   simvastatin (ZOCOR) 20 MG tablet Take 1 tablet (20 mg total) by mouth daily. 90 tablet 1   torsemide (DEMADEX) 20 MG tablet Take 1 tablet (20 mg total) by mouth 2 (two) times daily. 180 tablet 1   potassium chloride (KLOR-CON) 20 MEQ packet Take 60 mEq by mouth daily for 3 days. 9  packet 0    Results for orders placed or performed during the hospital encounter of 06/16/21 (from the past 48 hour(s))  I-STAT, chem 8     Status: Abnormal   Collection Time: 06/16/21  7:56 AM  Result Value Ref Range   Sodium 135 135 - 145 mmol/L   Potassium 4.3 3.5 - 5.1 mmol/L   Chloride 92 (L) 98 - 111 mmol/L   BUN 26 (H) 8 - 23 mg/dL   Creatinine, Ser 1.10 (H) 0.44 - 1.00 mg/dL   Glucose, Bld 112 (H) 70 - 99 mg/dL    Comment: Glucose reference range applies only to samples taken after fasting for at least 8 hours.  Calcium, Ion 0.99 (L) 1.15 - 1.40 mmol/L   TCO2 30 22 - 32 mmol/L   Hemoglobin 18.7 (H) 12.0 - 15.0 g/dL   HCT 55.0 (H) 36.0 - 46.0 %   No results found.  Review of Systems  Constitutional: Negative.   HENT:  Positive for trouble swallowing.   Eyes: Negative.   Respiratory: Negative.    Cardiovascular: Negative.   Gastrointestinal: Negative.   Endocrine: Negative.   Genitourinary: Negative.   Musculoskeletal: Negative.   Skin: Negative.   Allergic/Immunologic: Negative.   Neurological: Negative.   Hematological: Negative.   Psychiatric/Behavioral: Negative.     Blood pressure (!) 122/99, pulse (!) 124, temperature 97.6 F (36.4 C), temperature source Oral, resp. rate (!) 32, height '5\' 1"'$  (1.549 m), weight 50.8 kg, SpO2 91 %. Physical Exam  GENERAL: The patient is AO x3, in no acute distress. HEENT: Head is normocephalic and atraumatic. EOMI are intact. Mouth is well hydrated and without lesions. NECK: Supple. No masses LUNGS: Clear to auscultation. No presence of rhonchi/wheezing/rales. Adequate chest expansion HEART: RRR, normal s1 and s2. ABDOMEN: Soft, nontender, no guarding, no peritoneal signs, and nondistended. BS +. No masses. EXTREMITIES: Without any cyanosis, clubbing, rash, lesions or edema. NEUROLOGIC: AOx3, no focal motor deficit. SKIN: no jaundice, no rashes  Assessment/Plan 80 year old female with past medical history of acoustic  neuroma, anxiety, coronary artery disease, heart failure with diastolic dysfunction, depression, atrial fibrillation, GERD, hypertension, hyperlipidemia, mitral regurgitation, who comes to the hospital for evaluation of dysphagia.  We will proceed with EGD with esophageal dilation.  Harvel Quale, MD 06/16/2021, 8:50 AM

## 2021-06-18 NOTE — Progress Notes (Signed)
Spoke with pt for routine post op follow up call- states she is still unable to eat which is the problem she went to MD about- instructed her to call the office and let them know.

## 2021-06-23 ENCOUNTER — Other Ambulatory Visit: Payer: Self-pay

## 2021-06-23 ENCOUNTER — Encounter: Payer: Self-pay | Admitting: Orthopedic Surgery

## 2021-06-23 ENCOUNTER — Ambulatory Visit (INDEPENDENT_AMBULATORY_CARE_PROVIDER_SITE_OTHER): Payer: Medicare HMO | Admitting: Orthopedic Surgery

## 2021-06-23 ENCOUNTER — Ambulatory Visit: Payer: Medicare HMO

## 2021-06-23 VITALS — Ht 61.0 in | Wt 112.0 lb

## 2021-06-23 DIAGNOSIS — S72141G Displaced intertrochanteric fracture of right femur, subsequent encounter for closed fracture with delayed healing: Secondary | ICD-10-CM

## 2021-06-23 DIAGNOSIS — S72141D Displaced intertrochanteric fracture of right femur, subsequent encounter for closed fracture with routine healing: Secondary | ICD-10-CM | POA: Diagnosis not present

## 2021-06-23 NOTE — Progress Notes (Signed)
Orthopaedic Postop Note  Assessment: Norma Barajas is a 80 y.o. female s/p cephalomedullary nail for right intertrochanteric femur fracture  DOS: 03/24/2021  Plan: Repeat radiographs obtained in clinic today demonstrates excellent alignment of the right intertrochanteric femur fracture.  There is no evidence of hardware failure or subsidence.  No lag screw cut out.  She is not having any pain.  She has been able to ambulate, with the assistance of a walker.  She does report limited appetite, and recently underwent a procedure designed to make it easier for her to swallow.  I have encouraged her to remain active.  Ambulating will improve her strength, as well as help to heal the bone and surrounding structures.  She stated her understanding.  We will follow-up with her at the approximate 1 year Samyria Rudie following surgery.  Follow-up: Return in about 9 months (around 03/23/2022). XR at next visit: AP pelvis, right hip  Subjective:  Chief Complaint  Patient presents with   Routine Post Op    Rt hip fx DOS 03/24/21    History of Present Illness: Norma Barajas is a 79 y.o. female who presents following the above stated procedure.  Her surgery was 3 months ago.  She has recovered well.  She continues to have difficulty with her appetite.  She states she is not eating much.  This is been evaluated, she underwent a procedure to stretch her esophagus.  She denies pain in the right hip.  She uses a walker to assist with ambulation.  No issues with her surgical incisions.   Review of Systems: No fevers or chills No numbness or tingling No Chest Pain No shortness of breath   Objective: Ht '5\' 1"'$  (1.549 m)   Wt 112 lb (50.8 kg)   BMI 21.16 kg/m   Physical Exam:  Elderly female.  Seated in wheelchair.  Alert and oriented.  Right lateral hip and leg incisions are healing well.  No tenderness to palpation.  No surrounding redness or erythema.  She can maintain a straight leg raise.  No pain with axial  loading.  She tolerates gentle range of motion of the right hip.  She is able to bear weight, with minimal assistance.  Toes are warm and well-perfused.  IMAGING: I personally ordered and reviewed the following images:  AP pelvis, right femur x-rays were obtained in clinic today.  Compared to previous x-rays there has been no interval displacement of the fracture site.  Positioning of the lag screw remains stable.  No evidence of hardware failure or loosening.  There is been no lag screw cut out.  Impression: Right intertrochanteric femur fracture in excellent position, without hardware failure.   Mordecai Rasmussen, MD 06/23/2021 4:58 PM

## 2021-06-26 ENCOUNTER — Encounter: Payer: Self-pay | Admitting: Family Medicine

## 2021-06-26 ENCOUNTER — Other Ambulatory Visit: Payer: Self-pay

## 2021-06-26 ENCOUNTER — Ambulatory Visit (INDEPENDENT_AMBULATORY_CARE_PROVIDER_SITE_OTHER): Payer: Medicare HMO | Admitting: Family Medicine

## 2021-06-26 VITALS — BP 100/76 | HR 83 | Temp 97.9°F | Resp 20 | Ht 61.0 in | Wt 114.0 lb

## 2021-06-26 DIAGNOSIS — R1319 Other dysphagia: Secondary | ICD-10-CM

## 2021-06-26 DIAGNOSIS — I700 Atherosclerosis of aorta: Secondary | ICD-10-CM

## 2021-06-26 DIAGNOSIS — I480 Paroxysmal atrial fibrillation: Secondary | ICD-10-CM

## 2021-06-26 DIAGNOSIS — H9312 Tinnitus, left ear: Secondary | ICD-10-CM

## 2021-06-26 DIAGNOSIS — I10 Essential (primary) hypertension: Secondary | ICD-10-CM | POA: Diagnosis not present

## 2021-06-26 DIAGNOSIS — I2510 Atherosclerotic heart disease of native coronary artery without angina pectoris: Secondary | ICD-10-CM

## 2021-06-26 DIAGNOSIS — I5032 Chronic diastolic (congestive) heart failure: Secondary | ICD-10-CM

## 2021-06-26 DIAGNOSIS — E876 Hypokalemia: Secondary | ICD-10-CM

## 2021-06-26 DIAGNOSIS — K219 Gastro-esophageal reflux disease without esophagitis: Secondary | ICD-10-CM

## 2021-06-26 DIAGNOSIS — I5082 Biventricular heart failure: Secondary | ICD-10-CM

## 2021-06-26 DIAGNOSIS — M8000XD Age-related osteoporosis with current pathological fracture, unspecified site, subsequent encounter for fracture with routine healing: Secondary | ICD-10-CM

## 2021-06-26 MED ORDER — POTASSIUM CHLORIDE ER 20 MEQ PO TBCR: 20.0000 meq | EXTENDED_RELEASE_TABLET | Freq: Every day | ORAL | 1 refills | Status: DC

## 2021-06-26 NOTE — Progress Notes (Signed)
Assessment & Plan:  1. Essential hypertension Well controlled on current regimen.  - BMP8+EGFR  2. Atherosclerosis of native coronary artery of native heart without angina pectoris On a statin.  3. Paroxysmal atrial fibrillation (HCC) Well controlled on current regimen. Heart rate has decreased with the increase in metoprolol. - BMP8+EGFR  4. 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) Well controlled on current regimen. Encouraged patient to take torsemide twice daily as prescribed. Discussed the importance of potassium; she is agreeable to resume. Discussed dissolving and mixing in applesauce if she has a hard time swallowing. - BMP8+EGFR - potassium chloride 20 MEQ TBCR; Take 20 mEq by mouth daily.  Dispense: 90 tablet; Refill: 1  5. Aortic atherosclerosis ([place]) On a statin.  6. Gastroesophageal reflux disease without esophagitis Well controlled on current regimen.  - BMP8+EGFR  7. Esophageal dysphagia EGD recently with esophageal dilation.  8. Age-related osteoporosis with current pathological fracture with routine healing, subsequent encounter Not on calcium and vitamin D; nor a medication to treat.  9. Hypokalemia Labs to assess. Discussed importance of potassium.  - BMP8+EGFR - potassium chloride 20 MEQ TBCR; Take 20 mEq by mouth daily.  Dispense: 90 tablet; Refill: 1  10. Left-sided tinnitus Education provided on tinnitus.   Return in about 6 weeks (around 08/07/2021) for follow-up of chronic medication conditions.  [name], MSN, APRN, FNP-C [facility]  Subjective:    Patient ID: Norma Barajas, female    DOB: 1941-10-25, 80 y.o.   MRN: [id number]  Patient Care Team: Loman Brooklyn, FNP as PCP - General (Family Medicine) Minus Breeding, MD as PCP - Cardiology (Cardiology) [name], MD (Inactive) as Attending Physician (Gastroenterology) Melina Schools, OD as Consulting Physician  (Optometry) Sherran Needs, NP as Consulting Physician (Cardiology)   Chief Complaint:  Chief Complaint  Patient presents with   Medical Management of Chronic Issues    2 week f/u     HPI: Norma Barajas is a 80 y.o. female presenting on 06/26/2021 for Medical Management of Chronic Issues (2 week f/u )  A-Fib: patient previously reports she could feel herself in A-Fib and racing all the time. She has had a cardioversion in the past which only briefly converted. She does not wish to do this again. She has taken amiodarone in the past and did not like how it made her feel. She could not afford Tikosyn. Previously treated with Satolol which she failed. At our last visit her metoprolol was increased from 50 mg to 75 mg twice daily. She states this has improved her symptoms.   CHF/Edema: She is not taking torsemide as prescribed (20 mg BID), but is taking 20 mg daily. She is not taking potassium. Her diet is poor due to decreased appetite and she states food just doesn't taste good. She is drinking Ensure 2-3 x/day. She was previously prescribed an appetite stimulant which she did not take; she declines another one at this time. She does not wear compression hose.    New complaints: She reports loud racket in her left ear that has been present x1 month. Says it sounds like thunder. It only occurs when she is up walking. Denies head pressure or headaches.   Social history:  Relevant past medical, surgical, family and social history reviewed and updated as indicated. Interim medical history since our last visit reviewed.  Allergies and medications reviewed and updated.  DATA REVIEWED: CHART IN EPIC  ROS: Negative  unless specifically indicated above in HPI.    Current Outpatient Medications:    acetaminophen (TYLENOL) 650 MG CR tablet, Take 650 mg by mouth every 6 (six) hours as needed for pain. Displaced intertrochanteric fracture of right femur, Disp: , Rfl:    amLODipine (NORVASC) 10 MG  tablet, Take 0.5 tablets (5 mg total) by mouth daily., Disp: 90 tablet, Rfl: 1   aspirin EC 81 MG tablet, Take 1 tablet (81 mg total) by mouth 2 (two) times daily., Disp: 180 tablet, Rfl: 1   metoprolol tartrate (LOPRESSOR) 50 MG tablet, Take 1.5 tablets (75 mg total) by mouth 2 (two) times daily., Disp: 180 tablet, Rfl: 1   omeprazole (PRILOSEC) 20 MG capsule, Take 20 mg by mouth daily., Disp: , Rfl:    simvastatin (ZOCOR) 20 MG tablet, Take 1 tablet (20 mg total) by mouth daily., Disp: 90 tablet, Rfl: 1   torsemide (DEMADEX) 20 MG tablet, Take 1 tablet (20 mg total) by mouth 2 (two) times daily. (Patient taking differently: Take 20 mg by mouth daily.), Disp: 180 tablet, Rfl: 1   potassium chloride 20 MEQ TBCR, Take 20 mEq by mouth daily., Disp: 90 tablet, Rfl: 1   Allergies  Allergen Reactions   Codeine Nausea And Vomiting   Doxycycline Other (See Comments)    Chest congestion   Triamterene-Hctz Other (See Comments)    weakness   Atorvastatin Other (See Comments)    Myalgias    Crestor [Rosuvastatin Calcium] Other (See Comments)    weakness   Morphine Nausea Only   Risedronate Sodium Other (See Comments)    ACTONEL - reflux   Past Medical History:  Diagnosis Date   Acoustic neuroma ([place]) 02/18/2011   Right ear    Anxiety    Arthritis    "right leg" (04/11/2015)   BPPV (benign paroxysmal positional vertigo) 03/23/2016   Cataract    Coronary atherosclerosis of native coronary artery    a. Nonobstructive minimal CAD 10/2005.   Depression    Diastolic dysfunction    Grade 1. Ejection fraction 60-65%.   Dysrhythmia    a fib   Erosive esophagitis    Essential hypertension    GERD (gastroesophageal reflux disease)    Grade III hemorrhoids    History of hiatal hernia    Hypercholesterolemia    Internal hemorrhoids with complication 03/09/7740   OCT 2015 FLEX SIG/IH BANDING     Migraine    "used to have them right bad; I don't now" (04/11/2015)   MITRAL REGURGITATION 04/27/2010    Qualifier: Diagnosis of  By: [name], MD, [name]    Osteoporosis    Paroxysmal atrial fibrillation ([place])    PONV (postoperative nausea and vomiting)    Vertigo     Past Surgical History:  Procedure Laterality Date   BRAVO [facility] STUDY  11/15/2012   Procedure: BRAVO [place];  Surgeon: [name], MD;  [facility];  Service: Endoscopy;;   CARDIOVERSION N/A 05/16/2020   Procedure: CARDIOVERSION;  Surgeon: Donato Heinz, MD;  Location: [place];  Service: Cardiovascular;  Laterality: N/A;   CATARACT EXTRACTION W/ INTRAOCULAR LENS  IMPLANT, BILATERAL Bilateral    COLONOSCOPY  2008   Dr. [name]: internal hemorrhoids    DILATION AND CURETTAGE OF UTERUS     ESOPHAGEAL DILATION N/A 06/16/2021   Procedure: ESOPHAGEAL DILATION;  Surgeon: Harvel Quale, MD;  [facility];  Service: Gastroenterology;  Laterality: N/A;   ESOPHAGOGASTRODUODENOSCOPY (EGD) WITH ESOPHAGEAL DILATION  2001   Dr. [name]: erosive esophagitis, esophageal stricture, duodenitis, s/p Savary dilation   ESOPHAGOGASTRODUODENOSCOPY (EGD) WITH ESOPHAGEAL DILATION  11/15/2012   [id number] web was found & MOST LIKELY CAUSE FOR DYAPHAGIA/Polyp was found in the gastric body and gastric fundus/ gastritis on bx   ESOPHAGOGASTRODUODENOSCOPY (EGD) WITH PROPOFOL N/A 06/16/2021   Procedure: ESOPHAGOGASTRODUODENOSCOPY (EGD) WITH PROPOFOL;  Surgeon: Harvel Quale, MD;  [facility];  Service: Gastroenterology;  Laterality: N/A;  8:15   EYE SURGERY Bilateral    "laser OR after cataract OR; cause I couldn't see"   FLEXIBLE SIGMOIDOSCOPY N/A 08/22/2014   mild diverticulosis in sigmoid, moderate sized Grade 3 hemorrhoids s/p banding X 3.    FRACTURE SURGERY Right    below the knee - 2 bones broke has plates   HEMORRHOID BANDING N/A 08/22/2014   Procedure: HEMORRHOID BANDING;  Surgeon: [name], MD;  [facility];  Service: Endoscopy;  Laterality: N/A;    HEMORRHOID SURGERY N/A 03/30/2018   Procedure: EXTENSIVE HEMORRHOIDECTOMY;  Surgeon: [name], MD;  Location: [facility];  Service: General;  Laterality: N/A;   INTRAMEDULLARY (IM) NAIL INTERTROCHANTERIC Right 03/24/2021   Procedure: INTRAMEDULLARY (IM) NAIL INTERTROCHANTRIC;  Surgeon: Mordecai Rasmussen, MD;  Location: [facility];  Service: Orthopedics;  Laterality: Right;   OPEN REDUCTION INTERNAL FIXATION (ORIF) TIBIA/FIBULA FRACTURE Right 2013   broke tibia and fibula after falling down stairs   PROLAPSED UTERINE FIBROID LIGATION  2015   TOTAL ABDOMINAL HYSTERECTOMY      Social History   Socioeconomic History   Marital status: Married    Spouse name: george    Number of children: 1   Years of education: Not on file   Highest education level: Not on file  Occupational History   Occupation: Retired    Comment: Textile  Tobacco Use   Smoking status: Never   Smokeless tobacco: Never  Scientific laboratory technician Use: Never used  Substance and Sexual Activity   Alcohol use: No    Alcohol/week: 0.0 standard drinks   Drug use: No   Sexual activity: Not Currently    Birth control/protection: Surgical, Post-menopausal    Comment: hyst  Other Topics Concern   Not on file  Social History Narrative   Married   No regular exercise   Social Determinants of Health   Financial Resource Strain: Not on file  Food Insecurity: Not on file  Transportation Needs: Not on file  Physical Activity: Not on file  Stress: Not on file  Social Connections: Not on file  Intimate Partner Violence: Not on file        Objective:    BP 100/76   Pulse 83   Temp 97.9 F (36.6 C)   Resp 20   Ht 5' 1"  (1.549 m)   Wt 114 lb (51.7 kg)   SpO2 95%   BMI 21.54 kg/m   Wt Readings from Last 3 Encounters:  06/26/21 114 lb (51.7 kg)  06/23/21 112 lb (50.8 kg)  06/16/21 112 lb (50.8 kg)    Physical Exam Vitals reviewed.  Constitutional:      General: She is not in acute distress.    Appearance: Normal  appearance. She is normal weight. She is not ill-appearing, toxic-appearing or diaphoretic.  HENT:     Head: Normocephalic and atraumatic.     Left Ear: Tympanic membrane, ear canal and external ear normal. There is no impacted cerumen.  Eyes:     General: No  scleral icterus.       Right eye: No discharge.        Left eye: No discharge.     Conjunctiva/sclera: Conjunctivae normal.  Cardiovascular:     Rate and Rhythm: Tachycardia present. Rhythm irregular.     Heart sounds: Normal heart sounds. No murmur heard.   No friction rub. No gallop.  Pulmonary:     Effort: Pulmonary effort is normal. No respiratory distress.     Breath sounds: Normal breath sounds. No stridor. No wheezing, rhonchi or rales.  Musculoskeletal:        General: Normal range of motion.     Cervical back: Normal range of motion.     Right lower leg: 2+ Edema present.     Left lower leg: 2+ Edema present.  Skin:    General: Skin is warm and dry.     Capillary Refill: Capillary refill takes less than 2 seconds.  Neurological:     General: No focal deficit present.     Mental Status: She is alert and oriented to person, place, and time. Mental status is at baseline.     Gait: Gait abnormal (riding in [facility]).  Psychiatric:        Mood and Affect: Mood normal.        Behavior: Behavior normal.        Thought Content: Thought content normal.        Judgment: Judgment normal.    Lab Results  Component Value Date   TSH 2.540 06/09/2021   Lab Results  Component Value Date   WBC 7.3 04/24/2021   HGB 18.7 (H) 06/16/2021   HCT 55.0 (H) 06/16/2021   MCV 95 04/24/2021   PLT 187 04/24/2021   Lab Results  Component Value Date   NA 135 06/16/2021   K 4.3 06/16/2021   CO2 32 06/12/2021   GLUCOSE 112 (H) 06/16/2021   BUN 26 (H) 06/16/2021   CREATININE 1.10 (H) 06/16/2021   BILITOT 0.5 06/09/2021   ALKPHOS 85 06/09/2021   AST 18 06/09/2021   ALT 12 06/09/2021   PROT 6.3 06/09/2021   ALBUMIN 3.8 06/09/2021    CALCIUM 8.7 (L) 06/12/2021   ANIONGAP 12 06/12/2021   EGFR 43 (L) 06/09/2021   Lab Results  Component Value Date   CHOL 164 01/22/2021   Lab Results  Component Value Date   HDL 49 01/22/2021   Lab Results  Component Value Date   LDLCALC 90 01/22/2021   Lab Results  Component Value Date   TRIG 142 01/22/2021   Lab Results  Component Value Date   CHOLHDL 3.3 01/22/2021   Lab Results  Component Value Date   HGBA1C 5.6 02/28/2017

## 2021-06-27 LAB — BMP8+EGFR
BUN/Creatinine Ratio: 14 (ref 12–28)
BUN: 12 mg/dL (ref 8–27)
CO2: 26 mmol/L (ref 20–29)
Calcium: 8.6 mg/dL — ABNORMAL LOW (ref 8.7–10.3)
Chloride: 94 mmol/L — ABNORMAL LOW (ref 96–106)
Creatinine, Ser: 0.83 mg/dL (ref 0.57–1.00)
Glucose: 113 mg/dL — ABNORMAL HIGH (ref 65–99)
Potassium: 3.3 mmol/L — ABNORMAL LOW (ref 3.5–5.2)
Sodium: 138 mmol/L (ref 134–144)
eGFR: 70 mL/min/{1.73_m2} (ref >59)

## 2021-07-14 ENCOUNTER — Other Ambulatory Visit: Payer: Self-pay | Admitting: Family Medicine

## 2021-07-14 IMAGING — CT CT ABD-PELV W/O CM
2 of 4 series · 16 of 46 positions shown, 18 images · non-contrast
Comparison: 03/23/2021

CLINICAL DATA: Follow-up pelvic lesion

EXAM:
CT ABDOMEN AND PELVIS WITHOUT CONTRAST
TECHNIQUE: Multidetector CT imaging of the abdomen and pelvis was performed
following the standard protocol without IV contrast.

[Series 2: axial st · axial · 0.74mm/px · z∈[-806,-466]mm · 13 of 76 slices shown, 15 images]
[im 4/76  soft-tissue]
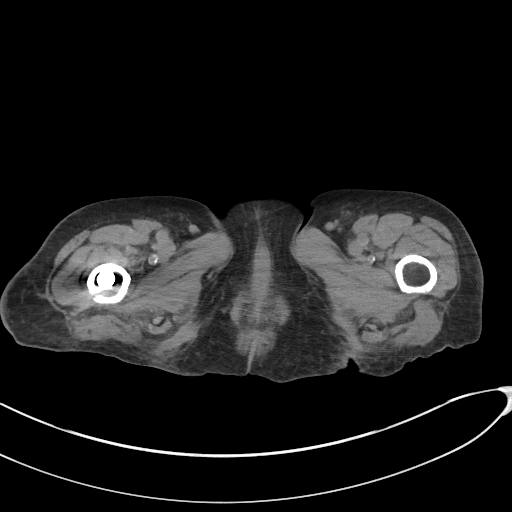
[im 4/76  bone]
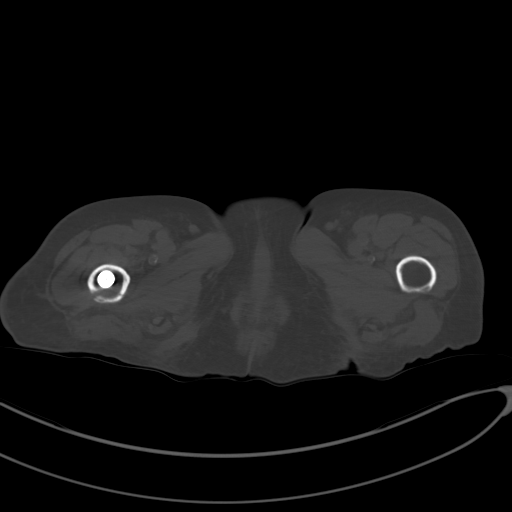
[im 12/76  soft-tissue]
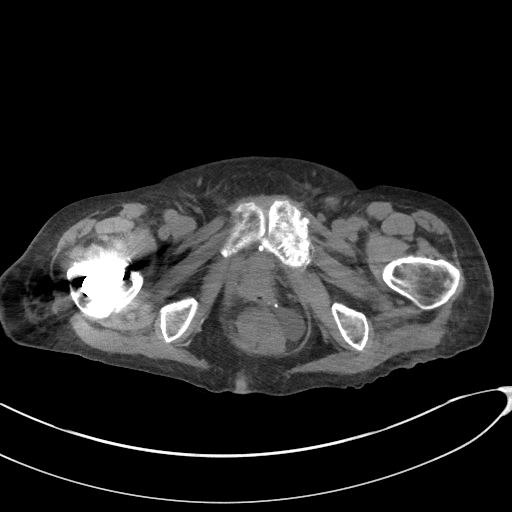
[im 16/76  soft-tissue]
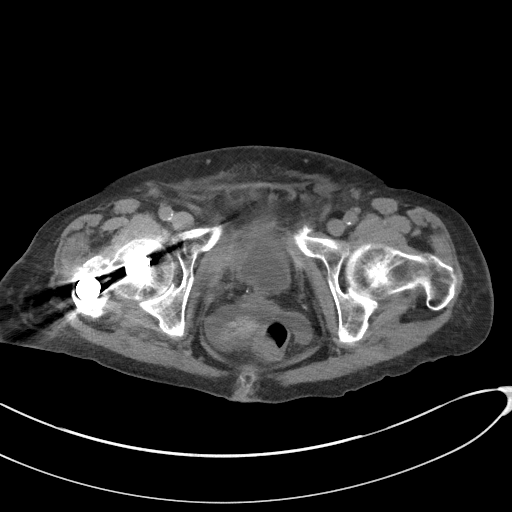
[im 23/76  soft-tissue]
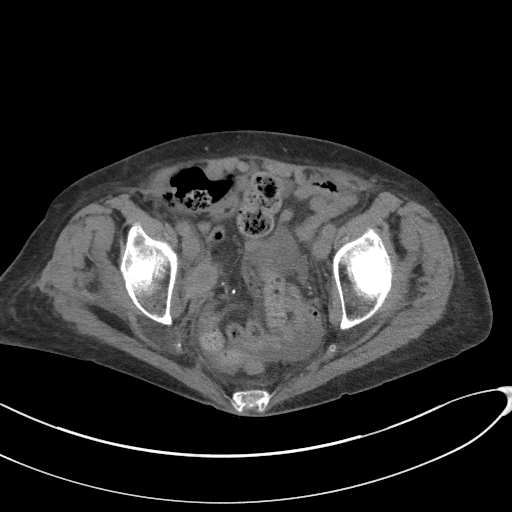
[im 27/76  soft-tissue]
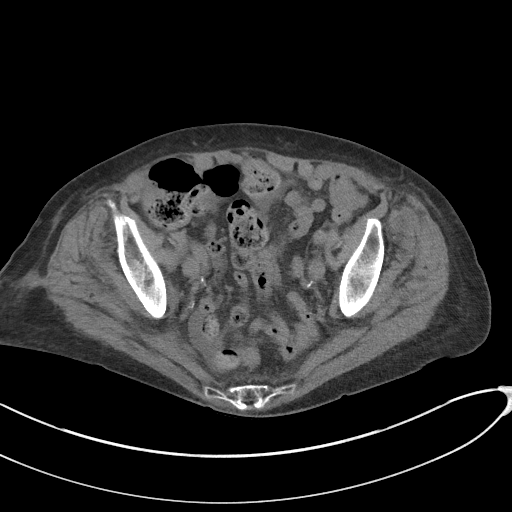
[im 34/76  soft-tissue]
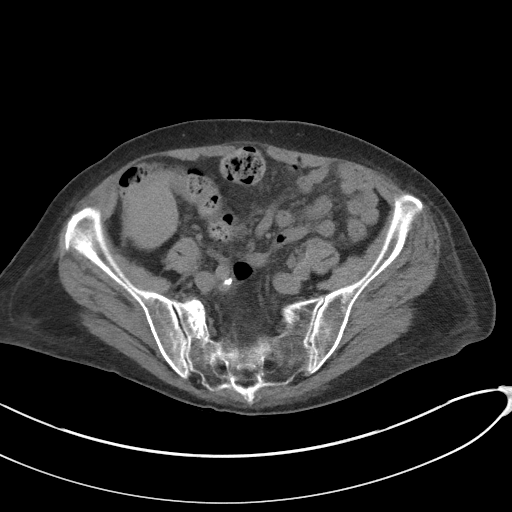
[im 38/76  soft-tissue]
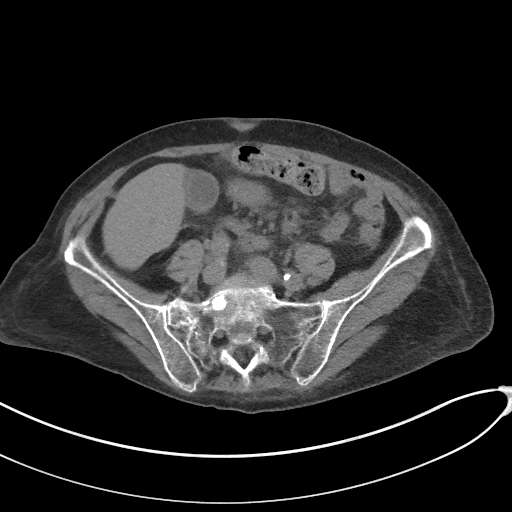
[im 42/76  soft-tissue]
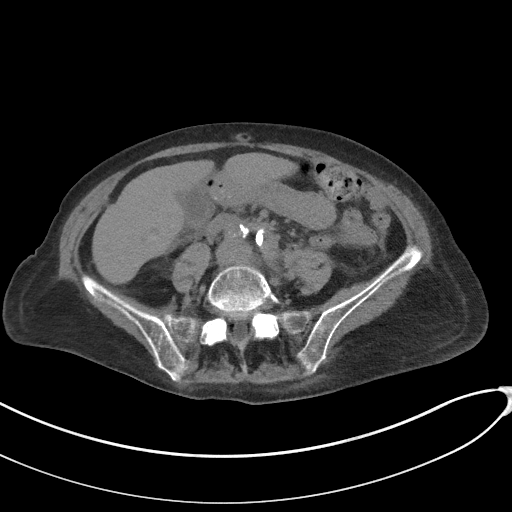
[im 49/76  soft-tissue]
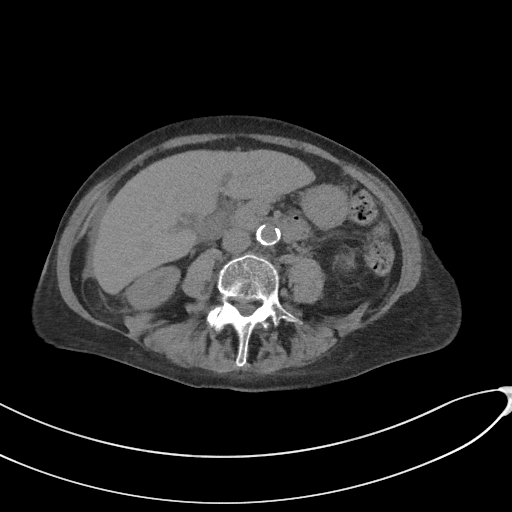
[im 49/76  bone]
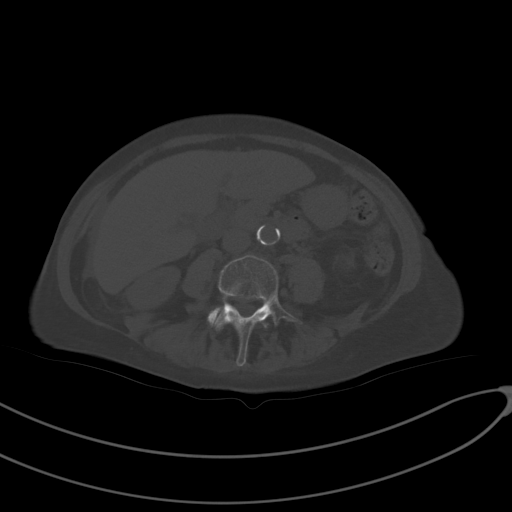
[im 53/76  soft-tissue]
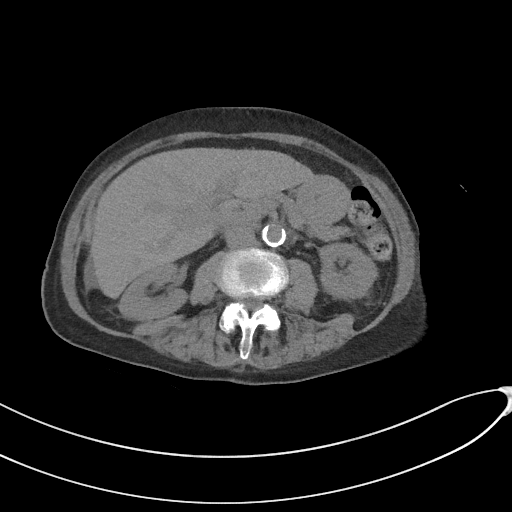
[im 61/76  soft-tissue]
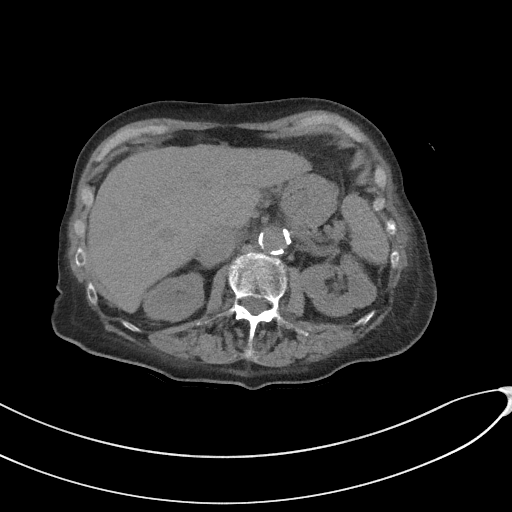
[im 64/76  soft-tissue]
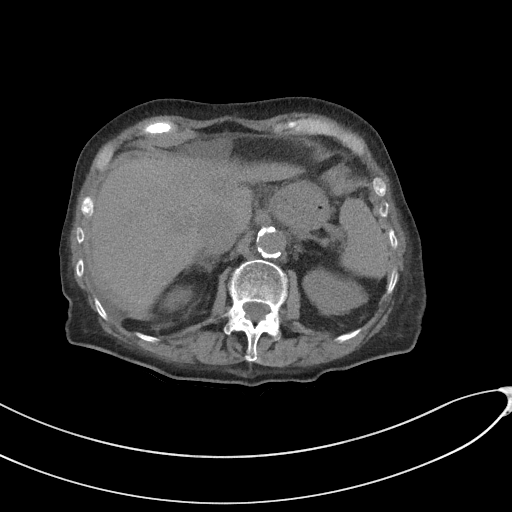
[im 72/76  soft-tissue]
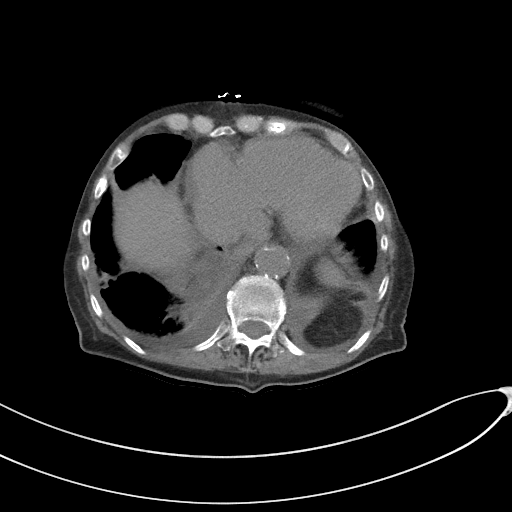

[Series 5: coronal st · coronal · 0.77mm/px · 3 of 92 slices shown]
[im 41/92  soft-tissue]
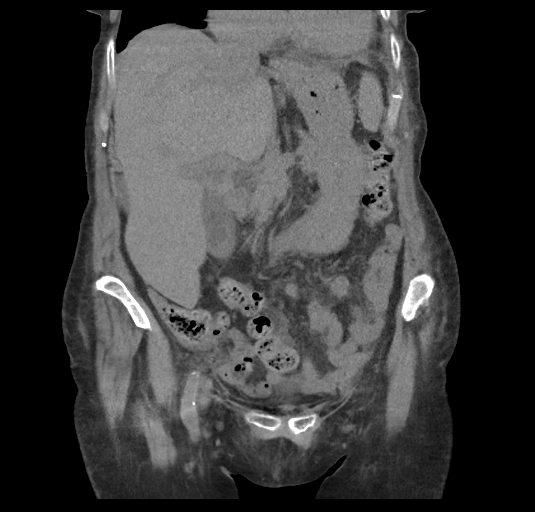
[im 51/92  soft-tissue]
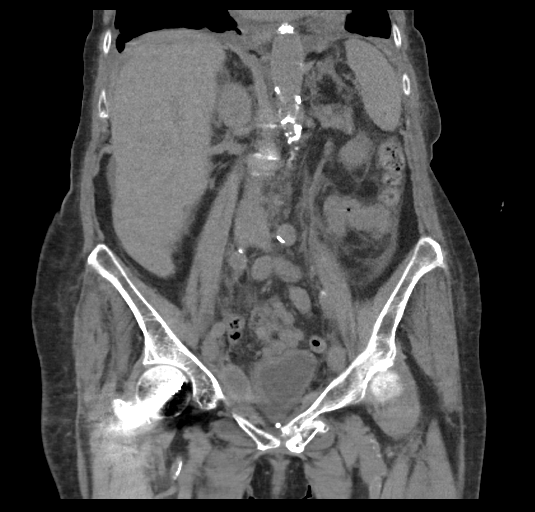
[im 61/92  soft-tissue]
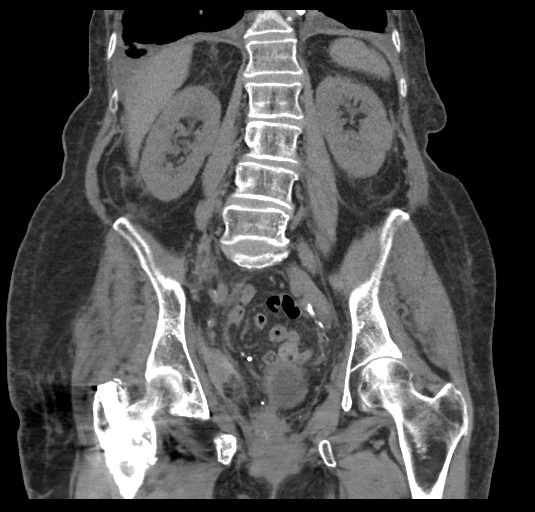

[16 of 46 positions shown; findings below may reference images not displayed]

FINDINGS: Lower chest: Small bilateral pleural effusions and associated
atelectasis or consolidation, not significantly changed.
Cardiomegaly.

Hepatobiliary: Hepatomegaly, maximum coronal span 22.0 cm. No solid
liver abnormality is seen. No gallstones, gallbladder wall
thickening, or biliary dilatation.

Pancreas: Unremarkable. No pancreatic ductal dilatation or
surrounding inflammatory changes.

Spleen: Normal in size without significant abnormality.

Adrenals/Urinary Tract: Definitively benign, fatty attenuation right
adrenal adenoma (series 2, image 11). Kidneys are normal, without
renal calculi, solid lesion, or hydronephrosis. Bladder is
unremarkable.

Stomach/Bowel: Stomach is within normal limits. Appendix is not
clearly identified. No evidence of bowel wall thickening,
distention, or inflammatory changes.

Vascular/Lymphatic: Aortic atherosclerosis. No enlarged abdominal or
pelvic lymph nodes.

Reproductive: Status post hysterectomy.

Other: No abdominal wall hernia or abnormality. Small volume ascites
throughout the abdomen and pelvis, new compared to prior
examination. Interval decrease in size of a rounded, intermediate
attenuation collection about the right pelvic sidewall, now
measuring 4.2 x 2.2 cm, previously 4.8 x 3.0 cm (series 2, image
57).

Musculoskeletal: Status post interval intramedullary nail fixation
of intertrochanteric fractures of the right femur.
IMPRESSION: 1. Interval decrease in size of a rounded, thick-walled intermediate
attenuation collection about the right pelvic sidewall, now
measuring 4.2 x 2.2 cm, previously 4.8 x 3.0 cm. Findings are
consistent with resolving hematoma in the setting of pelvic
fracture. Please note that the presence or absence of infection is
not established by CT.
2. Small volume ascites throughout the abdomen and pelvis, new
compared to prior examination.
3. Small bilateral pleural effusions and associated atelectasis or
consolidation, not significantly changed.
4. Status post interval intramedullary nail fixation of
intertrochanteric fractures of the right femur.
5. Hepatomegaly.

Aortic Atherosclerosis ([id number]-O1Z.Z).

## 2021-07-22 ENCOUNTER — Telehealth: Payer: Self-pay | Admitting: Family Medicine

## 2021-07-22 NOTE — Telephone Encounter (Signed)
Spoke with patient to schedule Medicare Annual Wellness Visit (AWV) either virtually or in office.   Last AWV 01/09/18 please schedule at anytime with health coach   Patient declined said she was not going to do.

## 2021-07-31 ENCOUNTER — Ambulatory Visit: Payer: Medicare HMO | Admitting: Cardiology

## 2021-08-05 ENCOUNTER — Other Ambulatory Visit: Payer: Self-pay

## 2021-08-05 ENCOUNTER — Ambulatory Visit (INDEPENDENT_AMBULATORY_CARE_PROVIDER_SITE_OTHER): Payer: Medicare HMO

## 2021-08-05 DIAGNOSIS — Z23 Encounter for immunization: Secondary | ICD-10-CM | POA: Diagnosis not present

## 2021-08-10 ENCOUNTER — Other Ambulatory Visit: Payer: Self-pay | Admitting: Family Medicine

## 2021-08-10 DIAGNOSIS — I5032 Chronic diastolic (congestive) heart failure: Secondary | ICD-10-CM

## 2021-08-12 ENCOUNTER — Encounter: Payer: Self-pay | Admitting: Family Medicine

## 2021-08-12 ENCOUNTER — Other Ambulatory Visit: Payer: Self-pay

## 2021-08-12 ENCOUNTER — Ambulatory Visit (INDEPENDENT_AMBULATORY_CARE_PROVIDER_SITE_OTHER): Payer: Medicare HMO

## 2021-08-12 ENCOUNTER — Ambulatory Visit (INDEPENDENT_AMBULATORY_CARE_PROVIDER_SITE_OTHER): Payer: Medicare HMO | Admitting: Family Medicine

## 2021-08-12 VITALS — BP 114/82 | HR 81 | Temp 97.6°F | Ht 61.0 in | Wt 110.2 lb

## 2021-08-12 DIAGNOSIS — E782 Mixed hyperlipidemia: Secondary | ICD-10-CM

## 2021-08-12 DIAGNOSIS — I480 Paroxysmal atrial fibrillation: Secondary | ICD-10-CM

## 2021-08-12 DIAGNOSIS — I5082 Biventricular heart failure: Secondary | ICD-10-CM | POA: Diagnosis not present

## 2021-08-12 DIAGNOSIS — R059 Cough, unspecified: Secondary | ICD-10-CM | POA: Diagnosis not present

## 2021-08-12 DIAGNOSIS — I10 Essential (primary) hypertension: Secondary | ICD-10-CM | POA: Diagnosis not present

## 2021-08-12 DIAGNOSIS — I700 Atherosclerosis of aorta: Secondary | ICD-10-CM

## 2021-08-12 DIAGNOSIS — I2510 Atherosclerotic heart disease of native coronary artery without angina pectoris: Secondary | ICD-10-CM | POA: Diagnosis not present

## 2021-08-12 DIAGNOSIS — R0989 Other specified symptoms and signs involving the circulatory and respiratory systems: Secondary | ICD-10-CM | POA: Diagnosis not present

## 2021-08-12 DIAGNOSIS — I5032 Chronic diastolic (congestive) heart failure: Secondary | ICD-10-CM

## 2021-08-12 DIAGNOSIS — E876 Hypokalemia: Secondary | ICD-10-CM

## 2021-08-12 DIAGNOSIS — M545 Low back pain, unspecified: Secondary | ICD-10-CM

## 2021-08-12 DIAGNOSIS — J9811 Atelectasis: Secondary | ICD-10-CM | POA: Diagnosis not present

## 2021-08-12 DIAGNOSIS — M8000XD Age-related osteoporosis with current pathological fracture, unspecified site, subsequent encounter for fracture with routine healing: Secondary | ICD-10-CM

## 2021-08-12 DIAGNOSIS — J90 Pleural effusion, not elsewhere classified: Secondary | ICD-10-CM | POA: Diagnosis not present

## 2021-08-12 DIAGNOSIS — F411 Generalized anxiety disorder: Secondary | ICD-10-CM

## 2021-08-12 MED ORDER — TRAMADOL HCL 50 MG PO TABS: 25.0000 mg | ORAL_TABLET | Freq: Two times a day (BID) | ORAL | 0 refills | Status: DC | PRN

## 2021-08-12 NOTE — Patient Instructions (Signed)
If you like the Tramadol and would like to continue, please schedule an appointment for pain management with me before you run out of medication.

## 2021-08-12 NOTE — Progress Notes (Signed)
Assessment & Plan:  1. Essential hypertension - Well controlled on current regimen - encouraged healthy diet and exercise - CMP14+EGFR - CBC with Differential/Platelet - Lipid panel  2. Atherosclerosis of native coronary artery of native heart without angina pectoris - continue statin - Lipid panel  3. Paroxysmal atrial fibrillation (HCC) - Rate controlled on current regimen. Not on blood thinner due to risks outweighing benefits.  - continue metoprolol  4. 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 Offered a liquid form of potassium since she is not taking the tablet and she declined. - CMP14+EGFR - CBC with Differential/Platelet - Lipid panel  5. Aortic atherosclerosis ([place]) - continue statin - Lipid panel  6. Mixed hyperlipidemia - Well controlled on current regimen - Lipid panel  7. Hypokalemia - encouraged taking her potassium as prescribed - patient declined liquid form of potassium - CMP14+EGFR  8. Abnormal chest sounds - will obtain CXR  9. Cough - will rule out aspiration pneumonia with CXR - declined a speech evaluation to assess - DG Chest 2 View  10. Lumbar pain - trial of Tramadol for pain as patient does not feel Tylenol is helpful. Advised if she wants to continue Tramadol she needs to schedule an appointment for pain management before she runs out of medication - traMADol (ULTRAM) 50 MG tablet; Take 0.5-1 tablets (25-50 mg total) by mouth 2 (two) times daily as needed.  Dispense: 10 tablet; Refill: 0  11. Age-related osteoporosis with current pathological fracture with routine healing, subsequent encounter - declines medication to treat, calcium, and vitamin D supplements  12. GAD - declines medication to treat  Return in about 3 months (around 11/12/2021) for follow-up of chronic medication conditions.  Norma Crater, NP Student  I  personally was present during the history, physical exam, and medical decision-making activities of this service and have verified that the service and findings are accurately documented in the nurse practitioner student's note.  [name], MSN, APRN, FNP-C [facility]   Subjective:    Patient ID: Norma Barajas, female    DOB: 07-02-1941, 80 y.o.   MRN: [id number]  Patient Care Team: Loman Brooklyn, FNP as PCP - General (Family Medicine) Minus Breeding, MD as PCP - Cardiology (Cardiology) [name], MD (Inactive) as Attending Physician (Gastroenterology) Melina Schools, OD as Consulting Physician (Optometry) Sherran Needs, NP as Consulting Physician (Cardiology)   Chief Complaint:  Chief Complaint  Patient presents with   Hypertension   Hyperlipidemia    6 week follow up of chronic medical conditions . Patient states that she noticed her chest "squeaking" at night x 2 weeks.    Back Pain    Lower back pain that has been on going     HPI: Norma Barajas is a 80 y.o. female presenting on 08/12/2021 for Hypertension, Hyperlipidemia (6 week follow up of chronic medical conditions . Patient states that she noticed her chest "squeaking" at night x 2 weeks. ), and Back Pain (Lower back pain that has been on going )  A-Fib: patient previously reports she could feel herself in A-Fib and racing all the time. She has had a cardioversion in the past which only briefly converted. She does not wish to do this again. She has taken amiodarone in the past and did not like how it made her feel. She could not afford Tikosyn. Previously treated with Satolol which she  failed. Metoprolol controls her symptoms. Coumadin was discontinued as her risks outweigh her benefits due to recurrent falls. She does still take aspirin 81 mg twice daily.   CHF/Edema: She is not taking torsemide as prescribed (20 mg BID), but is taking 20 mg daily. She is not taking potassium. She states she  eats foods very rich in potassium to offset not taking it. Her diet is poor due to decreased appetite and she states food just doesn't taste good. She is drinking Ensure 2-3 x/day. She was previously prescribed an appetite stimulant which she did not take; she declines another one at this time. She does not wear compression hose. She is having ongoing edema in her feet and ankles.   She has a history of osteoporosis with recurrent fractures, does not take calcium or vitamin D nor a medication to treat.   Depression screen [facility] 2/9 08/12/2021 06/26/2021 06/09/2021  Decreased Interest 0 0 1  Down, Depressed, Hopeless 0 0 1  PHQ - 2 Score 0 0 2  Altered sleeping _0 Tired, decreased energy 0 1 1  Change in appetite 0 1 1  Feeling bad or failure about yourself  0 0 0  Trouble concentrating 0 0 0  Moving slowly or fidgety/restless 0 0 0  Suicidal thoughts 0 0 0  PHQ-9 Score _1 Difficult doing work/chores Not difficult at all Somewhat difficult Not difficult at all  Some recent data might be hidden   GAD 7 : Generalized Anxiety Score 08/12/2021 06/26/2021 06/09/2021 04/30/2021  Nervous, Anxious, on Edge _2 0  Control/stop worrying 0 0 1 0  Worry too much - different things 0 0 1 0  Trouble relaxing 0 0 1 1  Restless 0 0 1 1  Easily annoyed or irritable 0 0 1 1  Afraid - awful might happen 0 0 0 0  Total GAD 7 Score _3 Anxiety Difficulty Somewhat difficult Somewhat difficult Very difficult -    New complaints: She reports a squeaking noise in her chest that occurs at night. Denies shortness of breath, but does feel tight in the chest. She states she coughs a lot when she eats and drinks. She had an esophageal dilation this year. She states when the squeaking occurs she takes a fluid pill and it helps reduce the symptoms.   Low back pain has been an issue for her for a while. She has had multiple falls. Her last lumbar spine x-ray was negative for acute fracture in October last year.  Pain has not changed/worsened since that time. She states she can lay on her back fine but it hurts to sit up. She says Tylenol does not help much.    Social history:  Relevant past medical, surgical, family and social history reviewed and updated as indicated. Interim medical history since our last visit reviewed.  Allergies and medications reviewed and updated.  DATA REVIEWED: CHART IN EPIC  ROS: Negative unless specifically indicated above in HPI.    Current Outpatient Medications:    acetaminophen (TYLENOL) 650 MG CR tablet, Take 650 mg by mouth every 6 (six) hours as needed for pain. Displaced intertrochanteric fracture of right femur, Disp: , Rfl:    amLODipine (NORVASC) 10 MG tablet, Take 0.5 tablets (5 mg total) by mouth daily., Disp: 90 tablet, Rfl: 1   aspirin EC 81 MG tablet, Take 1 tablet (81 mg total) by mouth 2 (two) times daily., Disp: 180 tablet,  Rfl: 1   metoprolol tartrate (LOPRESSOR) 50 MG tablet, Take 1.5 tablets (75 mg total) by mouth 2 (two) times daily., Disp: 180 tablet, Rfl: 1   omeprazole (PRILOSEC) 20 MG capsule, Take 20 mg by mouth daily., Disp: , Rfl:    potassium chloride 20 MEQ TBCR, Take 20 mEq by mouth daily., Disp: 90 tablet, Rfl: 1   simvastatin (ZOCOR) 20 MG tablet, Take 1 tablet (20 mg total) by mouth daily., Disp: 90 tablet, Rfl: 1   torsemide (DEMADEX) 20 MG tablet, Take 1 tablet (20 mg total) by mouth 2 (two) times daily. (Patient taking differently: Take 20 mg by mouth daily.), Disp: 180 tablet, Rfl: 1   traMADol (ULTRAM) 50 MG tablet, Take 0.5-1 tablets (25-50 mg total) by mouth 2 (two) times daily as needed., Disp: 10 tablet, Rfl: 0   Allergies  Allergen Reactions   Codeine Nausea And Vomiting   Doxycycline Other (See Comments)    Chest congestion   Triamterene-Hctz Other (See Comments)    weakness   Atorvastatin Other (See Comments)    Myalgias    Crestor [Rosuvastatin Calcium] Other (See Comments)    weakness   Morphine Nausea Only    Risedronate Sodium Other (See Comments)    ACTONEL - reflux   Past Medical History:  Diagnosis Date   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 Nonobstructive minimal CAD 10/2005.   Depression    Diastolic dysfunction    Grade 1. Ejection fraction 60-65%.   Dysrhythmia    a fib   Erosive esophagitis    Essential hypertension    GERD (gastroesophageal reflux disease)    Grade III hemorrhoids    History of hiatal hernia    Hypercholesterolemia    Internal hemorrhoids with complication 6/94/8546   OCT 2015 FLEX SIG/IH BANDING     Migraine    "used to have them right bad; I don't now" (04/11/2015)   MITRAL REGURGITATION 04/27/2010   Qualifier: Diagnosis of  By: [name], MD, [name]    Osteoporosis    Paroxysmal atrial fibrillation ([place])    PONV (postoperative nausea and vomiting)    Vertigo     Past Surgical History:  Procedure Laterality Date   BRAVO [facility] STUDY  11/15/2012   Procedure: BRAVO [place];  Surgeon: [name], MD;  [facility];  Service: Endoscopy;;   CARDIOVERSION N/A 05/16/2020   Procedure: CARDIOVERSION;  Surgeon: Donato Heinz, MD;  Location: [place];  Service: Cardiovascular;  Laterality: N/A;   CATARACT EXTRACTION W/ INTRAOCULAR LENS  IMPLANT, BILATERAL Bilateral    COLONOSCOPY  2008   Dr. [name]: internal hemorrhoids    DILATION AND CURETTAGE OF UTERUS     ESOPHAGEAL DILATION N/A 06/16/2021   Procedure: ESOPHAGEAL DILATION;  Surgeon: Harvel Quale, MD;  [facility];  Service: Gastroenterology;  Laterality: N/A;   ESOPHAGOGASTRODUODENOSCOPY (EGD) WITH ESOPHAGEAL DILATION  2001   Dr. [name]: erosive esophagitis, esophageal stricture, duodenitis, s/p Savary dilation   ESOPHAGOGASTRODUODENOSCOPY (EGD) WITH ESOPHAGEAL  DILATION  11/15/2012   EVO:[id number] web was found & MOST LIKELY CAUSE FOR DYAPHAGIA/Polyp was found in the gastric body and gastric fundus/ gastritis on bx   ESOPHAGOGASTRODUODENOSCOPY (EGD) WITH PROPOFOL N/A 06/16/2021   Procedure: ESOPHAGOGASTRODUODENOSCOPY (EGD) WITH  PROPOFOL;  Surgeon: Montez Morita, Quillian Quince, MD;  [facility];  Service: Gastroenterology;  Laterality: N/A;  8:15   EYE SURGERY Bilateral    "laser OR after cataract OR; cause I couldn't see"   FLEXIBLE SIGMOIDOSCOPY N/A 08/22/2014   mild diverticulosis in sigmoid, moderate sized Grade 3 hemorrhoids s/p banding X 3.    FRACTURE SURGERY Right    below the knee - 2 bones broke has plates   HEMORRHOID BANDING N/A 08/22/2014   Procedure: HEMORRHOID BANDING;  Surgeon: [name], MD;  [facility];  Service: Endoscopy;  Laterality: N/A;   HEMORRHOID SURGERY N/A 03/30/2018   Procedure: EXTENSIVE HEMORRHOIDECTOMY;  Surgeon: [name], MD;  Location: [facility];  Service: General;  Laterality: N/A;   INTRAMEDULLARY (IM) NAIL INTERTROCHANTERIC Right 03/24/2021   Procedure: INTRAMEDULLARY (IM) NAIL INTERTROCHANTRIC;  Surgeon: Mordecai Rasmussen, MD;  Location: [facility];  Service: Orthopedics;  Laterality: Right;   OPEN REDUCTION INTERNAL FIXATION (ORIF) TIBIA/FIBULA FRACTURE Right 2013   broke tibia and fibula after falling down stairs   PROLAPSED UTERINE FIBROID LIGATION  2015   TOTAL ABDOMINAL HYSTERECTOMY      Social History   Socioeconomic History   Marital status: Married    Spouse name: george    Number of children: 1   Years of education: Not on file   Highest education level: Not on file  Occupational History   Occupation: Retired    Comment: Textile  Tobacco Use   Smoking status: Never   Smokeless tobacco: Never  Scientific laboratory technician Use: Never used  Substance and Sexual Activity   Alcohol use: No    Alcohol/week: 0.0 standard drinks   Drug use: No   Sexual activity: Not Currently     Birth control/protection: Surgical, Post-menopausal    Comment: hyst  Other Topics Concern   Not on file  Social History Narrative   Married   No regular exercise   Social Determinants of Health   Financial Resource Strain: Not on file  Food Insecurity: Not on file  Transportation Needs: Not on file  Physical Activity: Not on file  Stress: Not on file  Social Connections: Not on file  Intimate Partner Violence: Not on file        Objective:    BP 114/82   Pulse 81   Temp 97.6 F (36.4 C) (Temporal)   Ht _0  (1.549 m)   Wt 50 kg   BMI 20.82 kg/m   Wt Readings from Last 3 Encounters:  08/12/21 110 lb 3.2 oz (50 kg)  06/26/21 114 lb (51.7 kg)  06/23/21 112 lb (50.8 kg)    Physical Exam Vitals reviewed.  Constitutional:      General: She is not in acute distress.    Appearance: Normal appearance. She is normal weight. She is not ill-appearing, toxic-appearing or diaphoretic.  HENT:     Head: Normocephalic and atraumatic.     Left Ear: Tympanic membrane, ear canal and external ear normal. There is no impacted cerumen.  Eyes:     General: No scleral icterus.       Right eye: No discharge.        Left eye: No discharge.     Conjunctiva/sclera: Conjunctivae normal.  Cardiovascular:     Rate and Rhythm: Normal rate. Rhythm irregular.     Heart sounds: Normal heart sounds. No murmur heard.   No friction rub. No gallop.  Pulmonary:  Effort: Pulmonary effort is normal. No respiratory distress.     Breath sounds: Normal breath sounds. No stridor. No wheezing, rhonchi or rales.  Musculoskeletal:        General: Normal range of motion.     Cervical back: Normal range of motion.     Right lower leg: 2+ Edema present.     Left lower leg: 2+ Edema present.  Skin:    General: Skin is warm and dry.     Capillary Refill: Capillary refill takes less than 2 seconds.  Neurological:     General: No focal deficit present.     Mental Status: She is alert and oriented to  person, place, and time. Mental status is at baseline.     Gait: Gait abnormal (using walker).  Psychiatric:        Mood and Affect: Mood normal.        Behavior: Behavior normal.        Thought Content: Thought content normal.        Judgment: Judgment normal.    Lab Results  Component Value Date   TSH 2.540 06/09/2021   Lab Results  Component Value Date   WBC 7.3 04/24/2021   HGB 18.7 (H) 06/16/2021   HCT 55.0 (H) 06/16/2021   MCV 95 04/24/2021   PLT 187 04/24/2021   Lab Results  Component Value Date   NA 138 06/26/2021   K 3.3 (L) 06/26/2021   CO2 26 06/26/2021   GLUCOSE 113 (H) 06/26/2021   BUN 12 06/26/2021   CREATININE 0.83 06/26/2021   BILITOT 0.5 06/09/2021   ALKPHOS 85 06/09/2021   AST 18 06/09/2021   ALT 12 06/09/2021   PROT 6.3 06/09/2021   ALBUMIN 3.8 06/09/2021   CALCIUM 8.6 (L) 06/26/2021   ANIONGAP 12 06/12/2021   EGFR 70 06/26/2021   Lab Results  Component Value Date   CHOL 164 01/22/2021   Lab Results  Component Value Date   HDL 49 01/22/2021   Lab Results  Component Value Date   LDLCALC 90 01/22/2021   Lab Results  Component Value Date   TRIG 142 01/22/2021   Lab Results  Component Value Date   CHOLHDL 3.3 01/22/2021   Lab Results  Component Value Date   HGBA1C 5.6 02/28/2017

## 2021-08-13 LAB — CMP14+EGFR
ALT: 10 IU/L (ref 0–32)
AST: 19 IU/L (ref 0–40)
Albumin/Globulin Ratio: 1.7 (ref 1.2–2.2)
Albumin: 4.5 g/dL (ref 3.6–4.6)
Alkaline Phosphatase: 158 IU/L — ABNORMAL HIGH (ref 44–121)
BUN/Creatinine Ratio: 18 (ref 12–28)
BUN: 14 mg/dL (ref 8–27)
Bilirubin Total: 0.6 mg/dL (ref 0.0–1.2)
CO2: 25 mmol/L (ref 20–29)
Calcium: 9.2 mg/dL (ref 8.7–10.3)
Chloride: 100 mmol/L (ref 96–106)
Creatinine, Ser: 0.8 mg/dL (ref 0.57–1.00)
Globulin, Total: 2.6 g/dL (ref 1.5–4.5)
Glucose: 99 mg/dL (ref 70–99)
Potassium: 3.5 mmol/L (ref 3.5–5.2)
Sodium: 145 mmol/L — ABNORMAL HIGH (ref 134–144)
Total Protein: 7.1 g/dL (ref 6.0–8.5)
eGFR: 74 mL/min/{1.73_m2} (ref >59)

## 2021-08-13 LAB — CBC WITH DIFFERENTIAL/PLATELET
Basophils Absolute: 0.1 10*3/uL (ref 0.0–0.2)
Basos: 1 %
EOS (ABSOLUTE): 0.1 10*3/uL (ref 0.0–0.4)
Eos: 2 %
Hematocrit: 42.1 % (ref 34.0–46.6)
Hemoglobin: 14.4 g/dL (ref 11.1–15.9)
Immature Grans (Abs): 0 10*3/uL (ref 0.0–0.1)
Immature Granulocytes: 0 %
Lymphocytes Absolute: 1.4 10*3/uL (ref 0.7–3.1)
Lymphs: 28 %
MCH: 31.7 pg (ref 26.6–33.0)
MCHC: 34.2 g/dL (ref 31.5–35.7)
MCV: 93 fL (ref 79–97)
Monocytes Absolute: 0.6 10*3/uL (ref 0.1–0.9)
Monocytes: 12 %
Neutrophils Absolute: 2.9 10*3/uL (ref 1.4–7.0)
Neutrophils: 57 %
Platelets: 132 10*3/uL — ABNORMAL LOW (ref 150–450)
RBC: 4.54 x10E6/uL (ref 3.77–5.28)
RDW: 14 % (ref 11.7–15.4)
WBC: 5 10*3/uL (ref 3.4–10.8)

## 2021-08-13 LAB — LIPID PANEL
Chol/HDL Ratio: 2.5 ratio (ref 0.0–4.4)
Cholesterol, Total: 130 mg/dL (ref 100–199)
HDL: 51 mg/dL (ref >39)
LDL Chol Calc (NIH): 62 mg/dL (ref 0–99)
Triglycerides: 92 mg/dL (ref 0–149)
VLDL Cholesterol Cal: 17 mg/dL (ref 5–40)

## 2021-08-14 ENCOUNTER — Other Ambulatory Visit: Payer: Self-pay | Admitting: Family Medicine

## 2021-08-14 MED ORDER — ALBUTEROL SULFATE HFA 108 (90 BASE) MCG/ACT IN AERS: 2.0000 | INHALATION_SPRAY | Freq: Four times a day (QID) | RESPIRATORY_TRACT | 2 refills | Status: DC | PRN

## 2021-08-14 MED ORDER — SPIRIVA RESPIMAT 2.5 MCG/ACT IN AERS: 2.0000 | INHALATION_SPRAY | Freq: Every day | RESPIRATORY_TRACT | 2 refills | Status: DC

## 2021-08-17 ENCOUNTER — Encounter: Payer: Self-pay | Admitting: Family Medicine

## 2021-08-19 ENCOUNTER — Other Ambulatory Visit: Payer: Self-pay | Admitting: Family Medicine

## 2021-08-19 DIAGNOSIS — I5032 Chronic diastolic (congestive) heart failure: Secondary | ICD-10-CM

## 2021-08-19 DIAGNOSIS — I5082 Biventricular heart failure: Secondary | ICD-10-CM

## 2021-09-01 NOTE — Progress Notes (Signed)
Cardiology Office Note   Date:  09/02/2021   ID:  Norma Barajas, Norma Barajas [birth year], MRN [id number]  PCP:  Loman Brooklyn, FNP  Cardiologist:   Minus Breeding, MD   Chief Complaint  Patient presents with   Atrial Fibrillation        History of Present Illness: Norma Barajas is a 80 y.o. female who presents for follow up of atrial fibrillation.  She has been seen by Dr. [name] and Dr. Curt Bears.  She did have an essentially normal echo in 2017.  She was treated with Tikosyn for a while but could not afford this.  She has been treated with Sotalol.   She has had recurrent fibrillation and was followed in the Atrial Fib clinic.  She has been treated with amiodarone and DCCV and she had recurrent fib.  She was to have another DCCV but she declined and wanted to pursue rate control with amiodarone.    Since I last saw her she was in the hospital with a fall and a right hip fracture.  She went to rehab.  I did review these records for this visit.  There is no mention of her passing out.  She did have a hematoma and was taken off of anticoagulation.  She has remained off of anticoagulation because of multiple falls.  She walks with a walker.  She feels her heart skipping and thinks she feels when she is in fibrillation but she has not had any rapid rates that she is recorded.  I do note that her blood pressure is running low on recent readings.  She has some lower extremity swelling.  She is not having any new shortness of breath, PND or orthopnea.  She is not having any new presyncope or syncope.  She denies any chest pain.   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 Nonobstructive minimal CAD 10/2005.   Depression    Diastolic dysfunction    Grade 1. Ejection fraction  60-65%.   Dysrhythmia    a fib   Erosive esophagitis    Essential hypertension    Fracture of ramus of right pubis with routine healing 12/09/2020   GERD (gastroesophageal reflux disease)    Grade III hemorrhoids    History of hiatal hernia    Hypercholesterolemia    Internal hemorrhoids with complication 3/00/9233   OCT 2015 FLEX SIG/IH BANDING     Intertrochanteric fracture of femur ([place])    5/16 - 03/26/2021 fracture sustained in a mechanical fall.  IM nailing performed 5/17 by Dr Larena Glassman Warfarin held because of posttraumatic pelvic hematoma.  Postop DVT with 81 mg aspirin twice daily x28 days.   Major depressive disorder, recurrent, moderate ([place]) 12/09/2020   Migraine    "used to have them right bad; I don't now" (04/11/2015)   MITRAL REGURGITATION 04/27/2010   Qualifier: Diagnosis of  By: [name], MD, [name]    Osteoporosis    Paroxysmal atrial fibrillation [facility])    Pelvic hematoma, female 03/27/2021   Sustained in mechanical fall.  Warfarin held.  IM nailing performed 5/17 with postop DVT prophylaxis 81 mg twice daily x28 days.   PONV (postoperative nausea and vomiting)  Vertigo     Past Surgical History:  Procedure Laterality Date   BRAVO [place] STUDY  11/15/2012   Procedure: BRAVO [place];  Surgeon: [name], MD;  [facility];  Service: Endoscopy;;   CARDIOVERSION N/A 05/16/2020   Procedure: CARDIOVERSION;  Surgeon: Donato Heinz, MD;  Location: [place];  Service: Cardiovascular;  Laterality: N/A;   CATARACT EXTRACTION W/ INTRAOCULAR LENS  IMPLANT, BILATERAL Bilateral    COLONOSCOPY  2008   Dr. [name]: internal hemorrhoids    DILATION AND CURETTAGE OF UTERUS     ESOPHAGEAL DILATION N/A 06/16/2021   Procedure: ESOPHAGEAL DILATION;  Surgeon: Harvel Quale, MD;  [facility];  Service: Gastroenterology;  Laterality: N/A;   ESOPHAGOGASTRODUODENOSCOPY (EGD) WITH ESOPHAGEAL DILATION  2001   Dr. [name]: erosive  esophagitis, esophageal stricture, duodenitis, s/p Savary dilation   ESOPHAGOGASTRODUODENOSCOPY (EGD) WITH ESOPHAGEAL DILATION  11/15/2012   YKD:[id number] web was found & MOST LIKELY CAUSE FOR DYAPHAGIA/Polyp was found in the gastric body and gastric fundus/ gastritis on bx   ESOPHAGOGASTRODUODENOSCOPY (EGD) WITH PROPOFOL N/A 06/16/2021   Procedure: ESOPHAGOGASTRODUODENOSCOPY (EGD) WITH PROPOFOL;  Surgeon: Harvel Quale, MD;  [facility];  Service: Gastroenterology;  Laterality: N/A;  8:15   EYE SURGERY Bilateral    "laser OR after cataract OR; cause I couldn't see"   FLEXIBLE SIGMOIDOSCOPY N/A 08/22/2014   mild diverticulosis in sigmoid, moderate sized Grade 3 hemorrhoids s/p banding X 3.    FRACTURE SURGERY Right    below the knee - 2 bones broke has plates   HEMORRHOID BANDING N/A 08/22/2014   Procedure: HEMORRHOID BANDING;  Surgeon: [name], MD;  [facility];  Service: Endoscopy;  Laterality: N/A;   HEMORRHOID SURGERY N/A 03/30/2018   Procedure: EXTENSIVE HEMORRHOIDECTOMY;  Surgeon: [name], MD;  Location: [facility];  Service: General;  Laterality: N/A;   INTRAMEDULLARY (IM) NAIL INTERTROCHANTERIC Right 03/24/2021   Procedure: INTRAMEDULLARY (IM) NAIL INTERTROCHANTRIC;  Surgeon: Mordecai Rasmussen, MD;  Location: [facility];  Service: Orthopedics;  Laterality: Right;   OPEN REDUCTION INTERNAL FIXATION (ORIF) TIBIA/FIBULA FRACTURE Right 2013   broke tibia and fibula after falling down stairs   PROLAPSED UTERINE FIBROID LIGATION  2015   TOTAL ABDOMINAL HYSTERECTOMY       Current Outpatient Medications  Medication Sig Dispense Refill   acetaminophen (TYLENOL) 650 MG CR tablet Take 650 mg by mouth every 6 (six) hours as needed for pain. Displaced intertrochanteric fracture of right femur     albuterol (VENTOLIN HFA) 108 (90 Base) MCG/ACT inhaler Inhale 2 puffs into the lungs every 6 (six) hours as needed. 18 g 2   amLODipine (NORVASC) 5 MG tablet Take  1 tablet (5 mg total) by mouth daily. 90 tablet 3   aspirin EC 81 MG tablet Take 1 tablet (81 mg total) by mouth 2 (two) times daily. 180 tablet 1   metoprolol tartrate (LOPRESSOR) 50 MG tablet Take 1.5 tablets (75 mg total) by mouth 2 (two) times daily. 180 tablet 1   omeprazole (PRILOSEC) 20 MG capsule Take 20 mg by mouth daily.     simvastatin (ZOCOR) 20 MG tablet Take 1 tablet (20 mg total) by mouth daily. 90 tablet 1   torsemide (DEMADEX) 20 MG tablet Take 1 tablet (20 mg total) by mouth 2 (two) times daily. (Patient taking differently: Take 20 mg by mouth daily.) 180 tablet 1   potassium chloride 20 MEQ TBCR Take 20 mEq by mouth  daily. (Patient not taking: Reported on 09/02/2021) 90 tablet 1   Tiotropium Bromide Monohydrate (SPIRIVA RESPIMAT) 2.5 MCG/ACT AERS Inhale 2 puffs into the lungs daily. (Patient not taking: Reported on 09/02/2021) 4 g 2   traMADol (ULTRAM) 50 MG tablet Take 0.5-1 tablets (25-50 mg total) by mouth 2 (two) times daily as needed. (Patient not taking: Reported on 09/02/2021) 10 tablet 0   No current facility-administered medications for this visit.    Allergies:   Codeine, Doxycycline, Triamterene-hctz, Atorvastatin, Crestor [rosuvastatin calcium], Morphine, and Risedronate sodium    ROS:  Please see the history of present illness.   Otherwise, review of systems are positive for none.   All other systems are reviewed and negative.    PHYSICAL EXAM: VS:  BP 100/60   Pulse 87   Ht 5\' 1"  (1.549 m)   Wt 107 lb (48.5 kg)   BMI 20.22 kg/m  , BMI Body mass index is 20.22 kg/m. GENERAL: Very frail appearing NECK:  No jugular venous distention, waveform within normal limits, carotid upstroke brisk and symmetric, no bruits, no thyromegaly LUNGS:  Clear to auscultation bilaterally CHEST:  Unremarkable HEART:  PMI not displaced or sustained,S1 and S2 within normal limits, no S3, no clicks, no rubs, 3/6 holosystolic murmur heard at the third left intercostal space, no  diastolic murmurs, irregular ABD:  Flat, positive bowel sounds normal in frequency in pitch, no bruits, no rebound, no guarding, no midline pulsatile mass, no hepatomegaly, no splenomegaly EXT:  2 plus pulses throughout, moderate bilateral lower extremity edema, no cyanosis no clubbing    EKG:  EKG is ordered today. Atrial fibrillation, rate 87,axis within normal limits, intervals within normal limits, no acute ST-T wave changes.  Low voltage in limb and chest leads.  I did compare the previous EKG of the low voltages have been evident on multiple previous EKGs.    Recent Labs: 09/10/2020: B Natriuretic Peptide 546.0 03/24/2021: Magnesium 2.0 06/09/2021: TSH 2.540 08/12/2021: ALT 10; BUN 14; Creatinine, Ser 0.80; Hemoglobin 14.4; Platelets 132; Potassium 3.5; Sodium 145    Lipid Panel    Component Value Date/Time   CHOL 130 08/12/2021 1456   CHOL 192 05/15/2013 0829   TRIG 92 08/12/2021 1456   TRIG 258 (H) 03/19/2014 0812   TRIG 179 (H) 05/15/2013 0829   HDL 51 08/12/2021 1456   HDL 45 03/19/2014 0812   HDL 44 05/15/2013 0829   CHOLHDL 2.5 08/12/2021 1456   CHOLHDL 3.9 02/10/2016 0208   VLDL 33 02/10/2016 0208   LDLCALC 62 08/12/2021 1456   LDLCALC 101 (H) 03/19/2014 0812   LDLCALC 112 (H) 05/15/2013 0829      Wt Readings from Last 3 Encounters:  09/02/21 107 lb (48.5 kg)  08/12/21 110 lb 3.2 oz (50 kg)  06/26/21 114 lb (51.7 kg)      Other studies Reviewed: Additional studies/ records that were reviewed today include: Hospital records Review of the above records demonstrates: See elsewhere   ASSESSMENT AND PLAN:   Essential hypertension: Her blood pressure is running quite low.  I will be reducing her amlodipine to 5 mg daily and she can watch it at home.  Atherosclerosis of native coronary artery of native heart without angina pectoris: She is having no further chest pain.  No work-up is planned.   Paroxysmal atrial fibrillation [facility]): She is in atrial  fibrillation.  She has not an anticoagulation candidate given her recent and frequent falls.  She seems to have good rate control.  No change in therapy.  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 ([place]): She seems to be euvolemic.  She does have some lower extremity swelling which may be related to the amlodipine and I will be reducing this as above.   Mixed hyperlipidemia: Her lipids are excellent with an LDL of 62 "51.  No change in therapy.   Abnormal EKG: There is evidence of low voltage but this has been unchanged over serial EKGs.  She had no evidence of pericardial effusion on echo previously.  She has no symptoms consistent with this.  No further imaging.  Current medicines are reviewed at length with the patient today.  The patient does not have concerns regarding medicines.  The following changes have been made: None  Labs/ tests ordered today include:   None  Orders Placed This Encounter  Procedures   EKG 12-Lead      Disposition:   FU with me in 12 months     Signed, Minus Breeding, MD  09/02/2021 2:09 PM    [place]

## 2021-09-02 ENCOUNTER — Other Ambulatory Visit: Payer: Self-pay

## 2021-09-02 ENCOUNTER — Ambulatory Visit (INDEPENDENT_AMBULATORY_CARE_PROVIDER_SITE_OTHER): Payer: Medicare HMO | Admitting: Cardiology

## 2021-09-02 ENCOUNTER — Encounter: Payer: Self-pay | Admitting: Cardiology

## 2021-09-02 VITALS — BP 100/60 | HR 87 | Ht 61.0 in | Wt 107.0 lb

## 2021-09-02 DIAGNOSIS — I700 Atherosclerosis of aorta: Secondary | ICD-10-CM | POA: Diagnosis not present

## 2021-09-02 DIAGNOSIS — E785 Hyperlipidemia, unspecified: Secondary | ICD-10-CM

## 2021-09-02 DIAGNOSIS — I5032 Chronic diastolic (congestive) heart failure: Secondary | ICD-10-CM | POA: Diagnosis not present

## 2021-09-02 DIAGNOSIS — I480 Paroxysmal atrial fibrillation: Secondary | ICD-10-CM | POA: Diagnosis not present

## 2021-09-02 MED ORDER — AMLODIPINE BESYLATE 5 MG PO TABS: 5.0000 mg | ORAL_TABLET | Freq: Every day | ORAL | 3 refills | Status: DC

## 2021-09-02 NOTE — Patient Instructions (Signed)
Medication Instructions:  Please decrease Amlodipine to 5 mg a day. Continue all other medications as listed.  *If you need a refill on your cardiac medications before your next appointment, please call your pharmacy*  Follow-Up: At [facility], you and your health needs are our priority.  As part of our continuing mission to provide you with exceptional heart care, we have created designated Provider Care Teams.  These Care Teams include your primary Cardiologist (physician) and Advanced Practice Providers (APPs -  Physician Assistants and Nurse Practitioners) who all work together to provide you with the care you need, when you need it.  We recommend signing up for the patient portal called "MyChart".  Sign up information is provided on this After Visit Summary.  MyChart is used to connect with patients for Virtual Visits (Telemedicine).  Patients are able to view lab/test results, encounter notes, upcoming appointments, etc.  Non-urgent messages can be sent to your provider as well.   To learn more about what you can do with MyChart, go to [email or web address].    Your next appointment:   1 year(s)  The format for your next appointment:   In Person  Provider:   Minus Breeding, MD   Thank you for choosing [facility]!!

## 2021-09-17 ENCOUNTER — Other Ambulatory Visit: Payer: Self-pay | Admitting: Family Medicine

## 2021-10-05 ENCOUNTER — Other Ambulatory Visit: Payer: Self-pay | Admitting: Family Medicine

## 2021-10-05 DIAGNOSIS — I5032 Chronic diastolic (congestive) heart failure: Secondary | ICD-10-CM

## 2021-10-05 MED ORDER — TORSEMIDE 20 MG PO TABS: 20.0000 mg | ORAL_TABLET | Freq: Two times a day (BID) | ORAL | 1 refills | Status: DC

## 2021-10-05 NOTE — Telephone Encounter (Signed)
Please advise on refill for Torsemide Last RFd 05/27/21 by Dr. Percival Spanish, from [place] here 08/12/21 Next OV 11/16/21

## 2021-10-05 NOTE — Telephone Encounter (Signed)
  Prescription Request  10/05/2021  Is this a "Controlled Substance" medicine? no  Have you seen your PCP in the last 2 weeks? no  If YES, route message to pool  -  If NO, patient needs to be scheduled for appointment.  What is the name of the medication or equipment? Torsemide 20 mg  Have you contacted your pharmacy to request a refill? NO   Which pharmacy would you like this sent to? [facility]   Patient notified that their request is being sent to the clinical staff for review and that they should receive a response within 2 business days.

## 2021-10-06 NOTE — Telephone Encounter (Signed)
Pt aware refill sent to pharmacy 

## 2021-10-15 ENCOUNTER — Other Ambulatory Visit: Payer: Self-pay | Admitting: Family Medicine

## 2021-10-15 DIAGNOSIS — I480 Paroxysmal atrial fibrillation: Secondary | ICD-10-CM

## 2021-10-15 DIAGNOSIS — G479 Sleep disorder, unspecified: Secondary | ICD-10-CM

## 2021-10-15 DIAGNOSIS — R630 Anorexia: Secondary | ICD-10-CM

## 2021-10-27 IMAGING — DX DG CHEST 2V
2 series · 2 of 2 positions shown · non-contrast
Comparison: 04/14/2021

CLINICAL DATA: Coughing with eating, squeaking noises, history
coronary artery disease, hypertension, atrial fibrillation

EXAM:
CHEST - 2 VIEW

[chest pa]
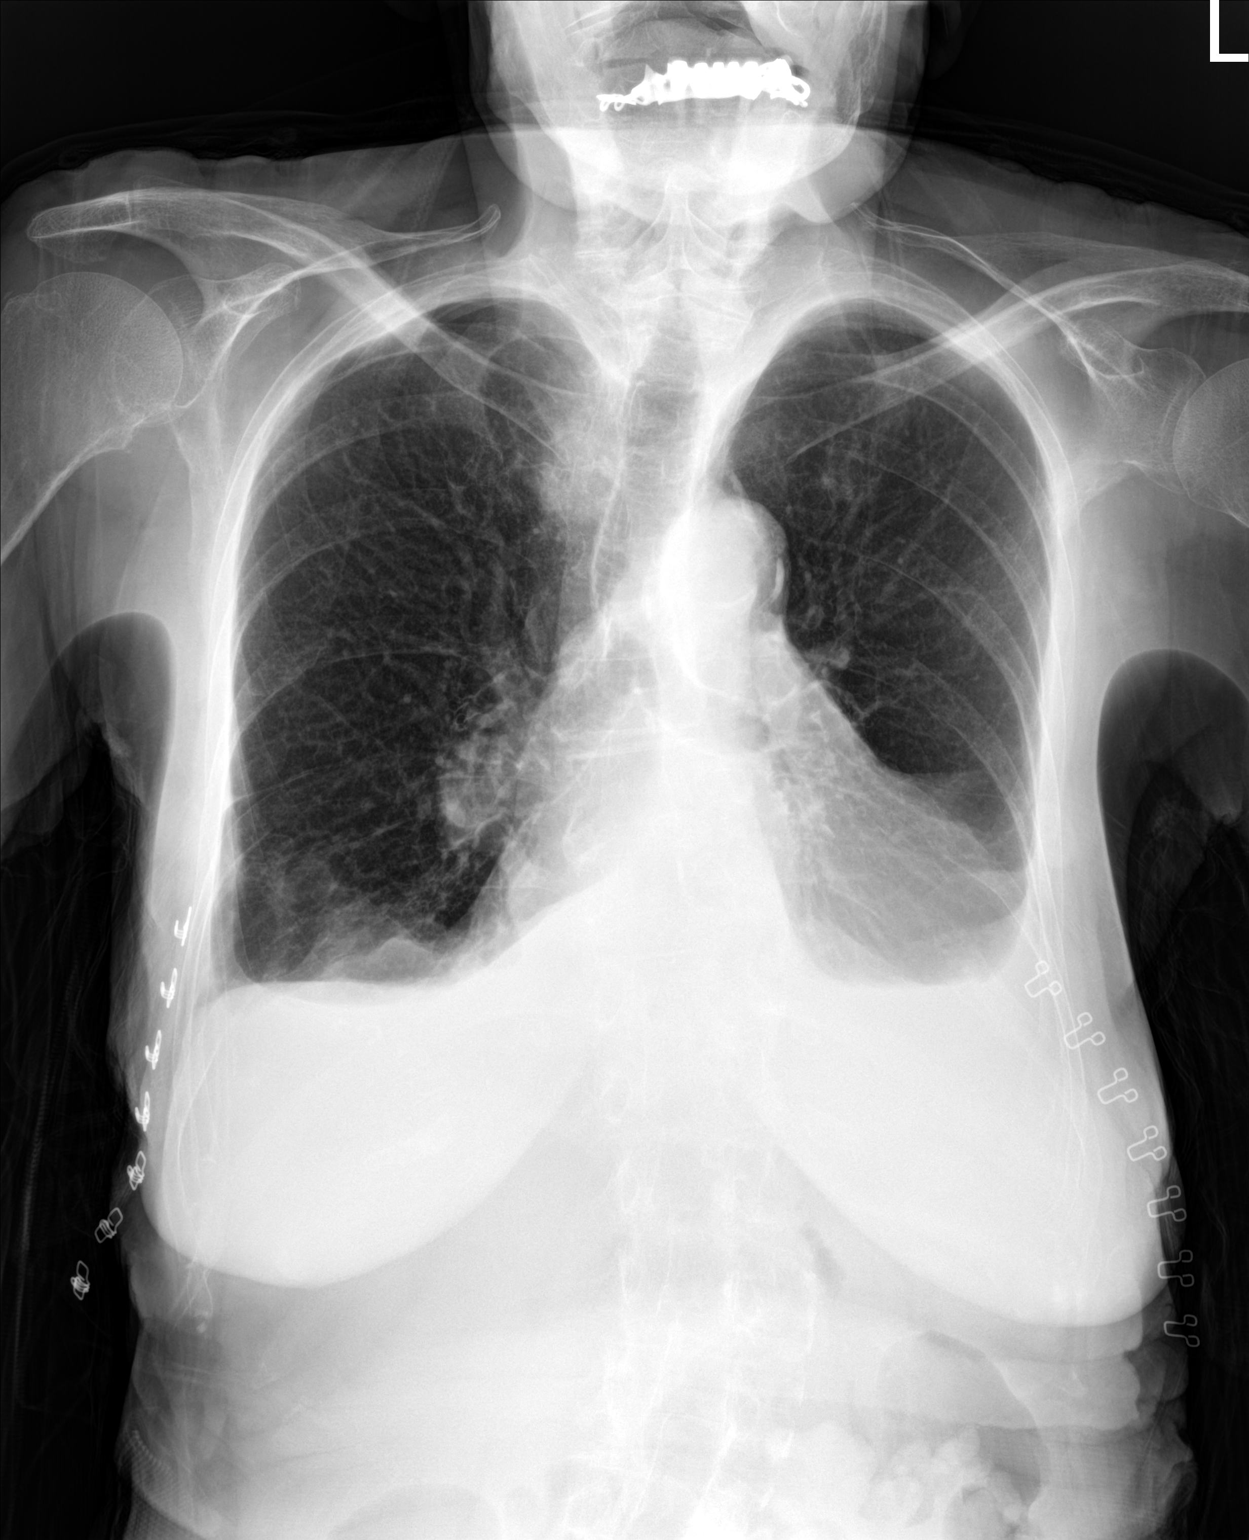

[chest lat]
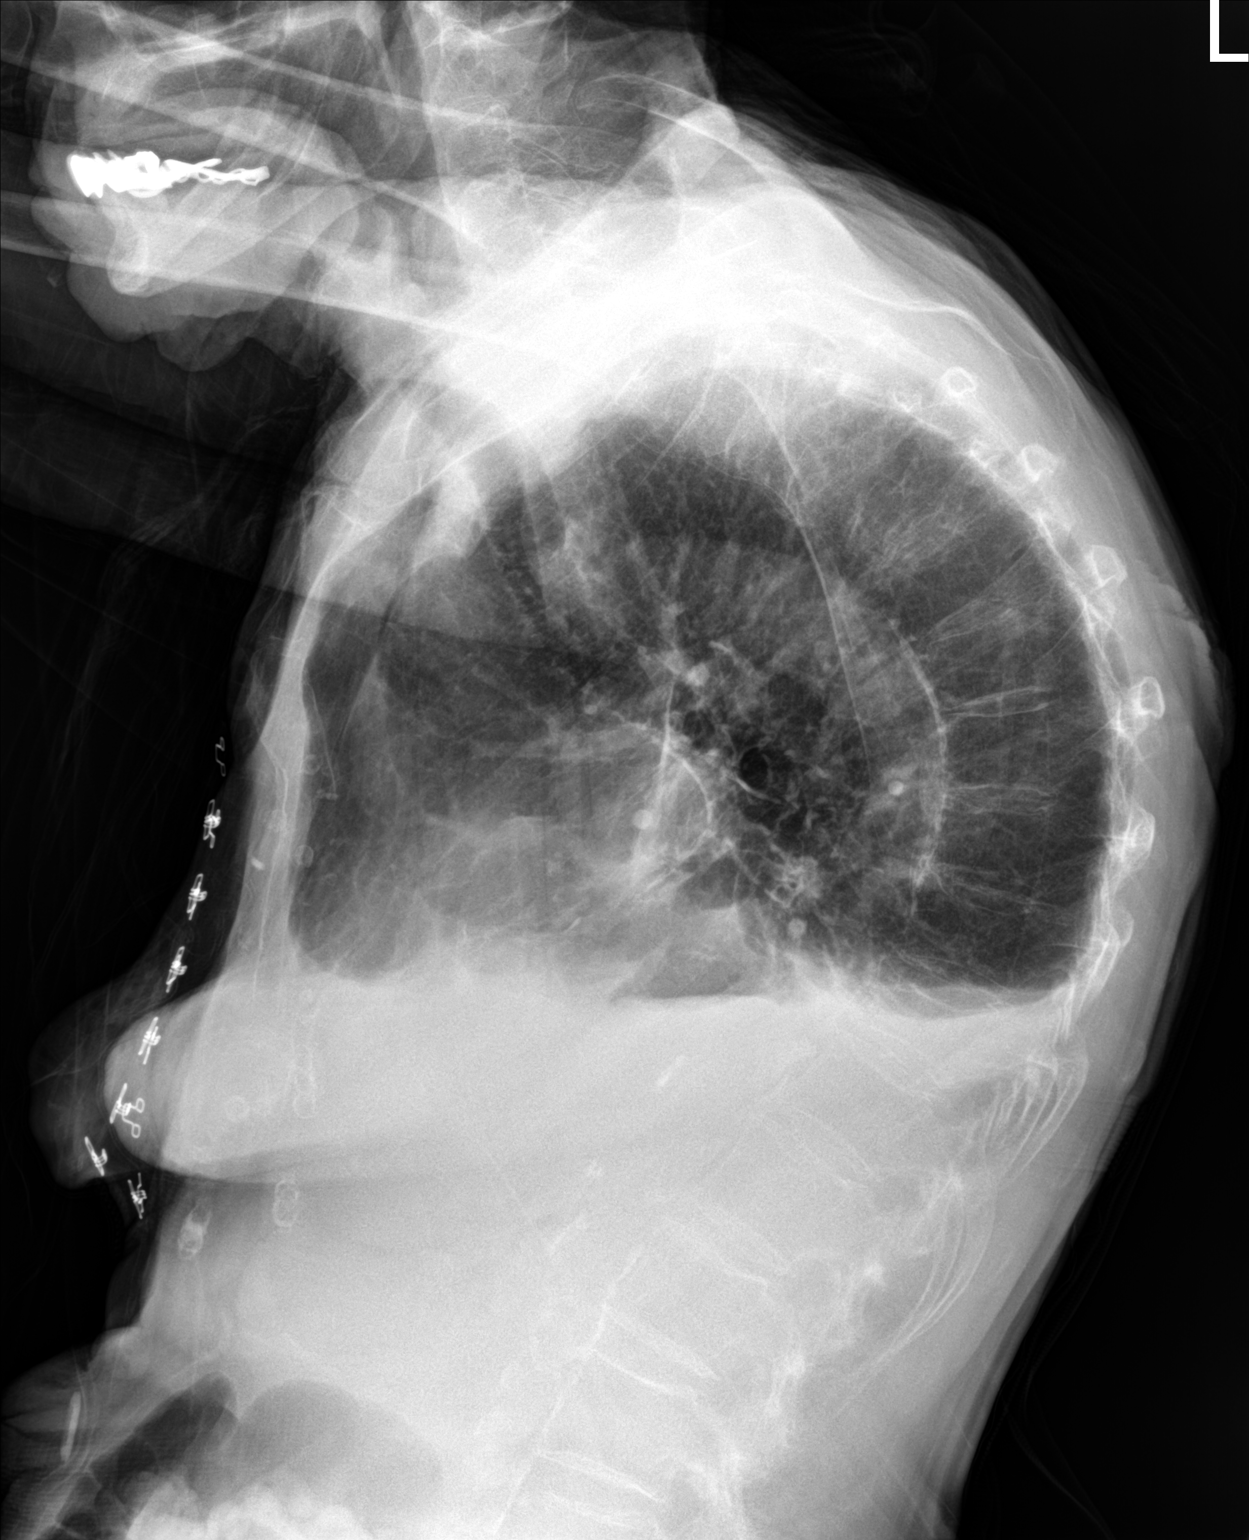

[2 of 2 positions shown; findings below may reference images not displayed]

FINDINGS: Enlargement of cardiac silhouette.

Mediastinal contours and pulmonary vascularity normal.

Atherosclerotic calcification aorta.

Bibasilar pleural effusions and atelectasis.

Hyperinflation and central peribronchial thickening.

Remaining lungs clear.

No pneumothorax.

Bones demineralized.
IMPRESSION: Hyperinflated lungs with bibasilar effusions and atelectasis.

Enlargement of cardiac silhouette.

Aortic Atherosclerosis ([id number]-XL5.5).

## 2021-11-10 ENCOUNTER — Encounter: Payer: Self-pay | Admitting: Family Medicine

## 2021-11-10 ENCOUNTER — Ambulatory Visit (INDEPENDENT_AMBULATORY_CARE_PROVIDER_SITE_OTHER): Payer: Medicare HMO | Admitting: Family Medicine

## 2021-11-10 ENCOUNTER — Ambulatory Visit (INDEPENDENT_AMBULATORY_CARE_PROVIDER_SITE_OTHER): Payer: Medicare HMO

## 2021-11-10 DIAGNOSIS — M4126 Other idiopathic scoliosis, lumbar region: Secondary | ICD-10-CM | POA: Diagnosis not present

## 2021-11-10 DIAGNOSIS — M545 Low back pain, unspecified: Secondary | ICD-10-CM | POA: Diagnosis not present

## 2021-11-10 MED ORDER — PREDNISONE 20 MG PO TABS: 20.0000 mg | ORAL_TABLET | Freq: Every day | ORAL | 0 refills | Status: AC

## 2021-11-10 NOTE — Addendum Note (Signed)
Addended by: [name] F on: 11/10/2021 11:29 AM   Modules accepted: Orders

## 2021-11-10 NOTE — Progress Notes (Addendum)
Virtual Visit via Telephone Note  I connected with Norma Barajas on 11/10/21 at 8:22 AM by telephone and verified that I am speaking with the correct person using two identifiers. Norma Barajas is currently located at home and nobody is currently with her during this visit. The provider, Loman Brooklyn, FNP is located in their office at time of visit.  I discussed the limitations, risks, security and privacy concerns of performing an evaluation and management service by telephone and the availability of in person appointments. I also discussed with the patient that there may be a patient responsible charge related to this service. The patient expressed understanding and agreed to proceed.  Subjective: PCP: Loman Brooklyn, FNP  Chief Complaint  Patient presents with   Back Pain   Patient reports she was making the bed this morning and hurt her back. States it started hurting really bad when she pulled up the mattress to tuck the comforter and now she can hardly go. The pain is located in her low back in the bony part of her spine. It does not radiate. She just took Tylenol 650 mg and has Tramadol to take if needed.   ROS: Per HPI  Current Outpatient Medications:    acetaminophen (TYLENOL) 650 MG CR tablet, Take 650 mg by mouth every 6 (six) hours as needed for pain. Displaced intertrochanteric fracture of right femur, Disp: , Rfl:    albuterol (VENTOLIN HFA) 108 (90 Base) MCG/ACT inhaler, Inhale 2 puffs into the lungs every 6 (six) hours as needed., Disp: 18 g, Rfl: 2   amLODipine (NORVASC) 5 MG tablet, Take 1 tablet (5 mg total) by mouth daily., Disp: 90 tablet, Rfl: 3   ASPIRIN LOW DOSE 81 MG EC tablet, TAKE 1 TABLET TWICE DAILY, Disp: 180 tablet, Rfl: 1   metoprolol tartrate (LOPRESSOR) 50 MG tablet, Take 1.5 tablets (75 mg total) by mouth 2 (two) times daily., Disp: 180 tablet, Rfl: 1   omeprazole (PRILOSEC) 20 MG capsule, TAKE 1 CAPSULE EVERY DAY, Disp: 90 capsule, Rfl: 1   potassium  chloride 20 MEQ TBCR, Take 20 mEq by mouth daily. (Patient not taking: Reported on 09/02/2021), Disp: 90 tablet, Rfl: 1   simvastatin (ZOCOR) 20 MG tablet, TAKE 1 TABLET EVERY DAY, Disp: 90 tablet, Rfl: 0   Tiotropium Bromide Monohydrate (SPIRIVA RESPIMAT) 2.5 MCG/ACT AERS, Inhale 2 puffs into the lungs daily. (Patient not taking: Reported on 09/02/2021), Disp: 4 g, Rfl: 2   torsemide (DEMADEX) 20 MG tablet, Take 1 tablet (20 mg total) by mouth 2 (two) times daily., Disp: 180 tablet, Rfl: 1   traMADol (ULTRAM) 50 MG tablet, Take 0.5-1 tablets (25-50 mg total) by mouth 2 (two) times daily as needed. 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 Nonobstructive minimal CAD 10/2005.   Depression    Diastolic dysfunction    Grade 1. Ejection fraction 60-65%.   Dysrhythmia    a fib   Erosive esophagitis    Essential hypertension    Fracture of ramus of right pubis with routine healing 12/09/2020   GERD (gastroesophageal reflux disease)    Grade III hemorrhoids    History of hiatal hernia    Hypercholesterolemia    Internal hemorrhoids with complication 2/95/1884   OCT 2015 FLEX SIG/IH BANDING     Intertrochanteric fracture of femur ([place])    5/16 - 03/26/2021 fracture sustained in a mechanical fall.  IM nailing performed 5/17 by Dr  Larena Glassman Warfarin held because of posttraumatic pelvic hematoma.  Postop DVT with 81 mg aspirin twice daily x28 days.   Major depressive disorder, recurrent, moderate ([place]) 12/09/2020   Migraine    "used to have them right bad; I don't now" (04/11/2015)   MITRAL REGURGITATION 04/27/2010   Qualifier: Diagnosis of  By: [name], MD, [name]    Osteoporosis    Paroxysmal atrial fibrillation [facility])    Pelvic hematoma, female 03/27/2021   Sustained in mechanical fall.  Warfarin held.  IM nailing performed 5/17 with postop DVT prophylaxis 81 mg twice daily x28 days.   PONV (postoperative nausea and vomiting)    Vertigo     Observations/Objective: A&O  No respiratory distress or wheezing audible over the phone Mood, judgement, and thought processes all WNL   Assessment and Plan: 1. Lumbar pain Tylenol and/or Tramadol as needed for pain. Will address further when x-ray results. - DG Lumbar Spine 2-3 Views; Future  X-ray negative for acute fracture. Rx'd Prednisone 20 mg PO x5 days.   Follow Up Instructions:  I discussed the assessment and treatment plan with the patient. The patient was provided an opportunity to ask questions and all were answered. The patient agreed with the plan and demonstrated an understanding of the instructions.   The patient was advised to call back or seek an in-person evaluation if the symptoms worsen or if the condition fails to improve as anticipated.  The above assessment and management plan was discussed with the patient. The patient verbalized understanding of and has agreed to the management plan. Patient is aware to call the clinic if symptoms persist or worsen. Patient is aware when to return to the clinic for a follow-up visit. Patient educated on when it is appropriate to go to the emergency department.   Time call ended: 8:33 AM  I provided 11 minutes of non-face-to-face time during this encounter.  [name], MSN, APRN, FNP-C [facility] 11/10/21

## 2021-11-13 ENCOUNTER — Encounter: Payer: Self-pay | Admitting: Family Medicine

## 2021-11-13 ENCOUNTER — Ambulatory Visit (INDEPENDENT_AMBULATORY_CARE_PROVIDER_SITE_OTHER): Payer: Medicare HMO | Admitting: Family Medicine

## 2021-11-13 VITALS — BP 123/87 | HR 91 | Temp 97.9°F | Ht 61.0 in | Wt 108.4 lb

## 2021-11-13 DIAGNOSIS — I110 Hypertensive heart disease with heart failure: Secondary | ICD-10-CM

## 2021-11-13 DIAGNOSIS — I480 Paroxysmal atrial fibrillation: Secondary | ICD-10-CM | POA: Diagnosis not present

## 2021-11-13 DIAGNOSIS — E876 Hypokalemia: Secondary | ICD-10-CM | POA: Diagnosis not present

## 2021-11-13 DIAGNOSIS — M8000XD Age-related osteoporosis with current pathological fracture, unspecified site, subsequent encounter for fracture with routine healing: Secondary | ICD-10-CM | POA: Diagnosis not present

## 2021-11-13 DIAGNOSIS — I700 Atherosclerosis of aorta: Secondary | ICD-10-CM

## 2021-11-13 DIAGNOSIS — I2510 Atherosclerotic heart disease of native coronary artery without angina pectoris: Secondary | ICD-10-CM | POA: Diagnosis not present

## 2021-11-13 DIAGNOSIS — M545 Low back pain, unspecified: Secondary | ICD-10-CM

## 2021-11-13 DIAGNOSIS — K219 Gastro-esophageal reflux disease without esophagitis: Secondary | ICD-10-CM | POA: Diagnosis not present

## 2021-11-13 DIAGNOSIS — I5032 Chronic diastolic (congestive) heart failure: Secondary | ICD-10-CM

## 2021-11-13 LAB — BMP8+EGFR
BUN/Creatinine Ratio: 22 (ref 12–28)
BUN: 24 mg/dL (ref 8–27)
CO2: 27 mmol/L (ref 20–29)
Calcium: 9.2 mg/dL (ref 8.7–10.3)
Chloride: 95 mmol/L — ABNORMAL LOW (ref 96–106)
Creatinine, Ser: 1.09 mg/dL — ABNORMAL HIGH (ref 0.57–1.00)
Glucose: 138 mg/dL — ABNORMAL HIGH (ref 70–99)
Potassium: 3.6 mmol/L (ref 3.5–5.2)
Sodium: 139 mmol/L (ref 134–144)
eGFR: 51 mL/min/{1.73_m2} — ABNORMAL LOW (ref >59)

## 2021-11-13 MED ORDER — TRAMADOL HCL 50 MG PO TABS: 25.0000 mg | ORAL_TABLET | Freq: Two times a day (BID) | ORAL | 0 refills | Status: DC | PRN

## 2021-11-13 MED ORDER — METHYLPREDNISOLONE ACETATE 40 MG/ML IJ SUSP
40.0000 mg | Freq: Once | INTRAMUSCULAR | Status: AC
  Administered 2021-11-13: 40 mg via INTRAMUSCULAR

## 2021-11-13 MED ORDER — METOPROLOL TARTRATE 50 MG PO TABS: 75.0000 mg | ORAL_TABLET | Freq: Two times a day (BID) | ORAL | 1 refills | Status: DC

## 2021-11-13 NOTE — Progress Notes (Signed)
Assessment & Plan:  1. Lumbar pain Encouraged patient to take Tramadol as prescribed. Reassurance provided as she is afraid to take it. Discussed she could take 1/2 tablet during the day and whole tablet at night so that she can get more relief of pain and possibly sleep. - traMADol (ULTRAM) 50 MG tablet; Take 0.5-1 tablets (25-50 mg total) by mouth 2 (two) times daily as needed.  Dispense: 30 tablet; Refill: 0 - methylPREDNISolone acetate (DEPO-MEDROL) injection 40 mg  2. Paroxysmal atrial fibrillation (HCC) Rate controlled on current regimen. Not on blood thinner due to risks outweighing benefits.  - metoprolol tartrate (LOPRESSOR) 50 MG tablet; Take 1.5 tablets (75 mg total) by mouth 2 (two) times daily.  Dispense: 270 tablet; Refill: 1  3. Hypertensive heart disease with chronic diastolic congestive heart failure ([place]) Well controlled on current regimen.  - BMP8+EGFR  4. Aortic atherosclerosis (HCC) Continue statin and aspirin.  5. Atherosclerosis of native coronary artery of native heart without angina pectoris Continue statin and aspirin.  6. Hypokalemia Continue eating foods high in potassium. - BMP8+EGFR  7. Gastroesophageal reflux disease without esophagitis Well controlled on current regimen.   8. Age-related osteoporosis with current pathological fracture with routine healing, subsequent encounter Declined medication to treat and repeat DEXA.   No follow-ups on file.  [name], MSN, APRN, FNP-C [facility]   Subjective:    Patient ID: Norma Barajas, female    DOB: 07-08-1941, 81 y.o.   MRN: [id number]  Patient Care Team: Loman Brooklyn, FNP as PCP - General (Family Medicine) Minus Breeding, MD as PCP - Cardiology (Cardiology) [name], MD (Inactive) as Attending Physician (Gastroenterology) Melina Schools, OD as Consulting Physician (Optometry) Sherran Needs, NP as Consulting Physician (Cardiology)   Chief Complaint:   Chief Complaint  Patient presents with   Hypertension   Hyperlipidemia    3 month follow up of chronic medical conditions    Back Pain    Patient had a visit on 1/3 and states that her back pain is no better.     HPI: Norma Barajas is a 81 y.o. female presenting on 11/13/2021 for Hypertension, Hyperlipidemia (3 month follow up of chronic medical conditions ), and Back Pain (Patient had a visit on 1/3 and states that her back pain is no better. )  A-Fib: patient previously reports she could feel herself in A-Fib and racing all the time. She has had a cardioversion in the past which only briefly converted. She does not wish to do this again. She has taken amiodarone in the past and did not like how it made her feel. She could not afford Tikosyn. Previously treated with Satolol which she failed. Metoprolol controls her symptoms. Coumadin was discontinued as her risks outweigh her benefits due to recurrent falls. She does still take aspirin 81 mg twice daily.   CHF/Edema: She is taking torsemide as prescribed 20 mg BID. States she tried to stop taking it but it caused chest discomfort so she resumed it and symptoms resolved. She is not taking potassium. She states she eats foods very rich in potassium to offset not taking it. She was previously prescribed an appetite stimulant which she did not take; she declines another one at this time. She does not wear compression hose. She has seen cardiology recently at which time her amlodipine was reduced from 10 mg to 5 mg to help with soft blood pressures and edema.   She has a  history of osteoporosis with recurrent fractures, does not take calcium or vitamin D nor a medication to treat.   Depression screen [facility] 2/9 11/13/2021 08/12/2021 06/26/2021  Decreased Interest 0 0 0  Down, Depressed, Hopeless 0 0 0  PHQ - 2 Score 0 0 0  Altered sleeping 0 3 1  Tired, decreased energy 1 0 1  Change in appetite 0 0 1  Feeling bad or failure about yourself  0 0 0   Trouble concentrating 0 0 0  Moving slowly or fidgety/restless 0 0 0  Suicidal thoughts 0 0 0  PHQ-9 Score 1 3 3   Difficult doing work/chores Somewhat difficult Not difficult at all Somewhat difficult  Some recent data might be hidden   GAD 7 : Generalized Anxiety Score 11/13/2021 08/12/2021 06/26/2021 06/09/2021  Nervous, Anxious, on Edge 0 2 1 1   Control/stop worrying 0 0 0 1  Worry too much - different things 0 0 0 1  Trouble relaxing 1 0 0 1  Restless 0 0 0 1  Easily annoyed or irritable 0 0 0 1  Afraid - awful might happen 0 0 0 0  Total GAD 7 Score 1 2 1 6   Anxiety Difficulty Somewhat difficult Somewhat difficult Somewhat difficult Very difficult   Back pain: patient has chronic back pain that worsened earlier this week when she was making the bed. Lumbar spine x-ray negative for acute fracture. She was treated with Prednisone 20 mg daily x5 days which she is still taking. She was advised to try the Tramadol she has at home for the pain, which she did one time (1/2 tablet) and states it did help. She reports her pain is no better and she is unable to sleep at night due to the pain.  New complaints: None   Social history:  Relevant past medical, surgical, family and social history reviewed and updated as indicated. Interim medical history since our last visit reviewed.  Allergies and medications reviewed and updated.  DATA REVIEWED: CHART IN EPIC  ROS: Negative unless specifically indicated above in HPI.    Current Outpatient Medications:    acetaminophen (TYLENOL) 650 MG CR tablet, Take 650 mg by mouth every 6 (six) hours as needed for pain. Displaced intertrochanteric fracture of right femur, Disp: , Rfl:    albuterol (VENTOLIN HFA) 108 (90 Base) MCG/ACT inhaler, Inhale 2 puffs into the lungs every 6 (six) hours as needed., Disp: 18 g, Rfl: 2   amLODipine (NORVASC) 5 MG tablet, Take 1 tablet (5 mg total) by mouth daily., Disp: 90 tablet, Rfl: 3   ASPIRIN LOW DOSE 81 MG EC  tablet, TAKE 1 TABLET TWICE DAILY, Disp: 180 tablet, Rfl: 1   metoprolol tartrate (LOPRESSOR) 50 MG tablet, Take 1.5 tablets (75 mg total) by mouth 2 (two) times daily., Disp: 180 tablet, Rfl: 1   omeprazole (PRILOSEC) 20 MG capsule, TAKE 1 CAPSULE EVERY DAY, Disp: 90 capsule, Rfl: 1   predniSONE (DELTASONE) 20 MG tablet, Take 1 tablet (20 mg total) by mouth daily with breakfast for 5 days., Disp: 10 tablet, Rfl: 0   simvastatin (ZOCOR) 20 MG tablet, TAKE 1 TABLET EVERY DAY, Disp: 90 tablet, Rfl: 0   Tiotropium Bromide Monohydrate (SPIRIVA RESPIMAT) 2.5 MCG/ACT AERS, Inhale 2 puffs into the lungs daily., Disp: 4 g, Rfl: 2   torsemide (DEMADEX) 20 MG tablet, Take 1 tablet (20 mg total) by mouth 2 (two) times daily., Disp: 180 tablet, Rfl: 1   traMADol (ULTRAM) 50 MG tablet, Take  0.5-1 tablets (25-50 mg total) by mouth 2 (two) times daily as needed., Disp: 10 tablet, Rfl: 0   potassium chloride 20 MEQ TBCR, Take 20 mEq by mouth daily. 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 Nonobstructive minimal CAD 10/2005.   Depression    Diastolic dysfunction    Grade 1. Ejection fraction 60-65%.   Dysrhythmia    a fib   Erosive esophagitis    Essential hypertension    Fracture of ramus of right pubis with routine healing 12/09/2020   GERD  (gastroesophageal reflux disease)    Grade III hemorrhoids    History of hiatal hernia    Hypercholesterolemia    Internal hemorrhoids with complication 8/33/8250   OCT 2015 FLEX SIG/IH BANDING     Intertrochanteric fracture of femur ([place])    5/16 - 03/26/2021 fracture sustained in a mechanical fall.  IM nailing performed 5/17 by Dr Larena Glassman Warfarin held because of posttraumatic pelvic hematoma.  Postop DVT with 81 mg aspirin twice daily x28 days.   Major depressive disorder, recurrent, moderate ([place]) 12/09/2020   Migraine    "used to have them right bad; I don't now" (04/11/2015)   MITRAL REGURGITATION 04/27/2010   Qualifier: Diagnosis of  By: [name], MD, [name]    Osteoporosis    Paroxysmal atrial fibrillation [facility])    Pelvic hematoma, female 03/27/2021   Sustained in mechanical fall.  Warfarin held.  IM nailing performed 5/17 with postop DVT prophylaxis 81 mg twice daily x28 days.   PONV (postoperative nausea and vomiting)    Vertigo     Past Surgical History:  Procedure Laterality Date   BRAVO [facility] STUDY  11/15/2012   Procedure: BRAVO [place];  Surgeon: [name], MD;  [facility];  Service: Endoscopy;;   CARDIOVERSION N/A 05/16/2020   Procedure: CARDIOVERSION;  Surgeon: Donato Heinz, MD;  Location: [facility];  Service: Cardiovascular;  Laterality: N/A;   CATARACT EXTRACTION W/ INTRAOCULAR LENS  IMPLANT, BILATERAL Bilateral    COLONOSCOPY  2008   Dr. [name]: internal hemorrhoids    DILATION AND CURETTAGE OF UTERUS     ESOPHAGEAL DILATION N/A 06/16/2021   Procedure: ESOPHAGEAL DILATION;  Surgeon: Harvel Quale, MD;  [facility];  Service: Gastroenterology;  Laterality: N/A;   ESOPHAGOGASTRODUODENOSCOPY (EGD) WITH ESOPHAGEAL DILATION  2001   Dr. [name]: erosive esophagitis, esophageal stricture, duodenitis, s/p Savary dilation   ESOPHAGOGASTRODUODENOSCOPY (EGD) WITH ESOPHAGEAL DILATION  11/15/2012   [id number] web was  found & MOST LIKELY CAUSE FOR DYAPHAGIA/Polyp was found in the gastric body and gastric fundus/ gastritis on bx   ESOPHAGOGASTRODUODENOSCOPY (EGD) WITH PROPOFOL N/A 06/16/2021   Procedure: ESOPHAGOGASTRODUODENOSCOPY (EGD) WITH PROPOFOL;  Surgeon:  Harvel Quale, MD;  [facility];  Service: Gastroenterology;  Laterality: N/A;  8:15   EYE SURGERY Bilateral    "laser OR after cataract OR; cause I couldn't see"   FLEXIBLE SIGMOIDOSCOPY N/A 08/22/2014   mild diverticulosis in sigmoid, moderate sized Grade 3 hemorrhoids s/p banding X 3.    FRACTURE SURGERY Right    below the knee - 2 bones broke has plates   HEMORRHOID BANDING N/A 08/22/2014   Procedure: HEMORRHOID BANDING;  Surgeon: [name], MD;  [facility];  Service: Endoscopy;  Laterality: N/A;   HEMORRHOID SURGERY N/A 03/30/2018   Procedure: EXTENSIVE HEMORRHOIDECTOMY;  Surgeon: [name], MD;  Location: [facility];  Service: General;  Laterality: N/A;   INTRAMEDULLARY (IM) NAIL INTERTROCHANTERIC Right 03/24/2021   Procedure: INTRAMEDULLARY (IM) NAIL INTERTROCHANTRIC;  Surgeon: Mordecai Rasmussen, MD;  Location: [facility];  Service: Orthopedics;  Laterality: Right;   OPEN REDUCTION INTERNAL FIXATION (ORIF) TIBIA/FIBULA FRACTURE Right 2013   broke tibia and fibula after falling down stairs   PROLAPSED UTERINE FIBROID LIGATION  2015   TOTAL ABDOMINAL HYSTERECTOMY      Social History   Socioeconomic History   Marital status: Married    Spouse name: Norma Barajas    Number of children: 1   Years of education: Not on file   Highest education level: Not on file  Occupational History   Occupation: Retired    Comment: Textile  Tobacco Use   Smoking status: Never   Smokeless tobacco: Never  Scientific laboratory technician Use: Never used  Substance and Sexual Activity   Alcohol use: No    Alcohol/week: 0.0 standard drinks   Drug use: No   Sexual activity: Not Currently    Birth control/protection: Surgical,  Post-menopausal    Comment: hyst  Other Topics Concern   Not on file  Social History Narrative   Married   No regular exercise   Social Determinants of Health   Financial Resource Strain: Not on file  Food Insecurity: Not on file  Transportation Needs: Not on file  Physical Activity: Not on file  Stress: Not on file  Social Connections: Not on file  Intimate Partner Violence: Not on file        Objective:    BP 123/87    Pulse 91    Temp 97.9 F (36.6 C)    Ht 5' 1"  (1.549 m)    Wt 108 lb 6.4 oz (49.2 kg)    SpO2 95%    BMI 20.48 kg/m   Wt Readings from Last 3 Encounters:  11/13/21 108 lb 6.4 oz (49.2 kg)  09/02/21 107 lb (48.5 kg)  08/12/21 110 lb 3.2 oz (50 kg)    Physical Exam Vitals reviewed.  Constitutional:      General: She is not in acute distress.    Appearance: Normal appearance. She is normal weight. She is not ill-appearing, toxic-appearing or diaphoretic.  HENT:     Head: Normocephalic and atraumatic.     Left Ear: Tympanic membrane, ear canal and external ear normal. There is no impacted cerumen.  Eyes:     General: No scleral icterus.       Right eye: No discharge.        Left eye: No discharge.     Conjunctiva/sclera: Conjunctivae normal.  Cardiovascular:     Rate and Rhythm: Normal rate. Rhythm irregular.     Heart sounds: Normal heart sounds. No murmur heard.  No friction rub. No gallop.  Pulmonary:     Effort: Pulmonary effort is normal. No respiratory distress.     Breath sounds: Normal breath sounds. No stridor. No wheezing, rhonchi or rales.  Musculoskeletal:        General: Normal range of motion.     Cervical back: Normal range of motion.     Right lower leg: Edema present.     Left lower leg: Edema present.  Skin:    General: Skin is warm and dry.     Capillary Refill: Capillary refill takes less than 2 seconds.  Neurological:     General: No focal deficit present.     Mental Status: She is alert and oriented to person, place,  and time. Mental status is at baseline.     Gait: Gait abnormal (using walker).  Psychiatric:        Mood and Affect: Mood normal.        Behavior: Behavior normal.        Thought Content: Thought content normal.        Judgment: Judgment normal.    Lab Results  Component Value Date   TSH 2.540 06/09/2021   Lab Results  Component Value Date   WBC 5.0 08/12/2021   HGB 14.4 08/12/2021   HCT 42.1 08/12/2021   MCV 93 08/12/2021   PLT 132 (L) 08/12/2021   Lab Results  Component Value Date   NA 145 (H) 08/12/2021   K 3.5 08/12/2021   CO2 25 08/12/2021   GLUCOSE 99 08/12/2021   BUN 14 08/12/2021   CREATININE 0.80 08/12/2021   BILITOT 0.6 08/12/2021   ALKPHOS 158 (H) 08/12/2021   AST 19 08/12/2021   ALT 10 08/12/2021   PROT 7.1 08/12/2021   ALBUMIN 4.5 08/12/2021   CALCIUM 9.2 08/12/2021   ANIONGAP 12 06/12/2021   EGFR 74 08/12/2021   Lab Results  Component Value Date   CHOL 130 08/12/2021   Lab Results  Component Value Date   HDL 51 08/12/2021   Lab Results  Component Value Date   LDLCALC 62 08/12/2021   Lab Results  Component Value Date   TRIG 92 08/12/2021   Lab Results  Component Value Date   CHOLHDL 2.5 08/12/2021   Lab Results  Component Value Date   HGBA1C 5.6 02/28/2017

## 2021-11-14 ENCOUNTER — Emergency Department (HOSPITAL_COMMUNITY)
Admission: EM | Admit: 2021-11-14 | Discharge: 2021-11-14 | Disposition: A | Discharge status: Home / Self Care | Payer: Medicare HMO | Attending: Emergency Medicine | Admitting: Emergency Medicine

## 2021-11-14 ENCOUNTER — Encounter (HOSPITAL_COMMUNITY): Payer: Self-pay | Admitting: Emergency Medicine

## 2021-11-14 DIAGNOSIS — I4891 Unspecified atrial fibrillation: Secondary | ICD-10-CM | POA: Diagnosis not present

## 2021-11-14 DIAGNOSIS — Z79899 Other long term (current) drug therapy: Secondary | ICD-10-CM | POA: Insufficient documentation

## 2021-11-14 DIAGNOSIS — Z7982 Long term (current) use of aspirin: Secondary | ICD-10-CM | POA: Diagnosis not present

## 2021-11-14 DIAGNOSIS — R000 Tachycardia, unspecified: Secondary | ICD-10-CM | POA: Insufficient documentation

## 2021-11-14 DIAGNOSIS — M5459 Other low back pain: Secondary | ICD-10-CM | POA: Diagnosis not present

## 2021-11-14 DIAGNOSIS — I10 Essential (primary) hypertension: Secondary | ICD-10-CM | POA: Diagnosis not present

## 2021-11-14 DIAGNOSIS — M545 Low back pain, unspecified: Secondary | ICD-10-CM | POA: Diagnosis not present

## 2021-11-14 LAB — CBC WITH DIFFERENTIAL/PLATELET
Abs Immature Granulocytes: 0.04 10*3/uL (ref 0.00–0.07)
Basophils Absolute: 0 10*3/uL (ref 0.0–0.1)
Basophils Relative: 0 %
Eosinophils Absolute: 0 10*3/uL (ref 0.0–0.5)
Eosinophils Relative: 0 %
HCT: 43.2 % (ref 36.0–46.0)
Hemoglobin: 14.5 g/dL (ref 12.0–15.0)
Immature Granulocytes: 0 %
Lymphocytes Relative: 13 %
Lymphs Abs: 1.2 10*3/uL (ref 0.7–4.0)
MCH: 33.8 pg (ref 26.0–34.0)
MCHC: 33.6 g/dL (ref 30.0–36.0)
MCV: 100.7 fL — ABNORMAL HIGH (ref 80.0–100.0)
Monocytes Absolute: 0.8 10*3/uL (ref 0.1–1.0)
Monocytes Relative: 8 %
Neutro Abs: 7.1 10*3/uL (ref 1.7–7.7)
Neutrophils Relative %: 79 %
Platelets: 136 10*3/uL — ABNORMAL LOW (ref 150–400)
RBC: 4.29 MIL/uL (ref 3.87–5.11)
RDW: 13.5 % (ref 11.5–15.5)
WBC: 9.1 10*3/uL (ref 4.0–10.5)
nRBC: 0 % (ref 0.0–0.2)

## 2021-11-14 LAB — BASIC METABOLIC PANEL
Anion gap: 10 (ref 5–15)
BUN: 24 mg/dL — ABNORMAL HIGH (ref 8–23)
CO2: 30 mmol/L (ref 22–32)
Calcium: 8.7 mg/dL — ABNORMAL LOW (ref 8.9–10.3)
Chloride: 99 mmol/L (ref 98–111)
Creatinine, Ser: 0.93 mg/dL (ref 0.44–1.00)
GFR, Estimated: >60 mL/min (ref >60)
Glucose, Bld: 105 mg/dL — ABNORMAL HIGH (ref 70–99)
Potassium: 2.9 mmol/L — ABNORMAL LOW (ref 3.5–5.1)
Sodium: 139 mmol/L (ref 135–145)

## 2021-11-14 MED ORDER — LIDOCAINE 5 % EX PTCH
1.0000 | MEDICATED_PATCH | Freq: Once | CUTANEOUS | Status: DC
  Administered 2021-11-14: 1 via TRANSDERMAL
  Filled 2021-11-14: qty 1

## 2021-11-14 MED ORDER — POTASSIUM CHLORIDE 20 MEQ PO PACK
40.0000 meq | PACK | Freq: Once | ORAL | Status: AC
  Administered 2021-11-14: 40 meq via ORAL
  Filled 2021-11-14: qty 2

## 2021-11-14 MED ORDER — ACETAMINOPHEN 325 MG PO TABS
650.0000 mg | ORAL_TABLET | Freq: Once | ORAL | Status: AC
  Administered 2021-11-14: 650 mg via ORAL
  Filled 2021-11-14: qty 2

## 2021-11-14 MED ORDER — DICLOFENAC SODIUM 1 % EX GEL: 2.0000 g | Freq: Four times a day (QID) | CUTANEOUS | 0 refills | Status: DC

## 2021-11-14 MED ORDER — POTASSIUM CHLORIDE CRYS ER 20 MEQ PO TBCR: 40.0000 meq | EXTENDED_RELEASE_TABLET | Freq: Once | ORAL | Status: DC

## 2021-11-14 MED ORDER — TRAMADOL HCL 50 MG PO TABS
25.0000 mg | ORAL_TABLET | Freq: Once | ORAL | Status: AC
  Administered 2021-11-14: 25 mg via ORAL
  Filled 2021-11-14: qty 1

## 2021-11-14 NOTE — ED Triage Notes (Addendum)
Patient c/o lower back pain x2 months that is progressively getting worse. Per patient no known injury. Patient does state "I did try to raise my mattress last Tuesday and it made it even worse." Patient seen by PCP yesterday for back pain and prescribed Tramadol. Per patient still no improvement. Denies taking the tramadol. Denies any shortness of breath or chest pain. Per patient pain worse with movement. Denies any complications with urination but does report no BM x2-3 days. CNS intact. Patient ambulated in room.

## 2021-11-14 NOTE — Discharge Instructions (Addendum)
It was a pleasure taking care of you today!   You were treated in the ED with a Lidoderm patch, Tylenol, and home dose of tramadol.  You may pick up lidocaine pain relief patch over-the-counter at [facility].  Reports lidocaine patch every 12 hours with a new patch. You will be prescribed Voltaren gel, take as prescribed.  You may apply heat to the affected area for 15 minutes at a time.  Ensure to place a barrier between your skin and the heat.    Your potassium was low in the ED and this was treated with potassium in the ED. Attached is information on potassium rich foods to consume. You may follow-up with your primary care provider as needed.  Return to the ED if you are experiencing increasing/worsening back pain, inability to walk, fever, or worsening symptoms.

## 2021-11-14 NOTE — ED Provider Notes (Signed)
[facility] EMERGENCY DEPARTMENT Provider Note   CSN: [id number] Arrival date & time: 11/14/21  0859     History  Chief Complaint  Patient presents with   Back Pain    Norma Barajas is a 81 y.o. female with a PMHx of back pain, A. fib, hypertension, who presents to the Emergency Department complaining of lower back pain onset 1 week.  Her lower back pain is worse on the left.  Her back pain does not radiate.  Patient was evaluated at her primary care office twice this week for similar concerns.  She had a lower back x-ray completed on 1/3 that was negative in the office.  Patient initially hurt her back on 1/3 while bending over to make her bed on Monday.  She was given injection of prednisone on yesterday and prescription for tramadol.  She tried tramadol with no relief for her symptoms. Pt denies bowel/bladder incontinence, fever, chills, CP, SOB, abdominal pain, n/v/d/c, hematuria, dysuria, numbness, tingling, weakness, and any other symptoms. Denies CA or IV drug use. Denies allergies to medications.  Per chart review: Patient was evaluated by her primary care provider on 11/10/2021 and 11/13/2021.  On the initial visit she was prescribed tramadol to aid with her lower back pain.  However, on her most recent visit with her primary care provider yesterday, patient reports that she has not been taking the tramadol.  The history is provided by the patient and a relative. No language interpreter was used.  Back Pain Location:  Lumbar spine Radiates to:  Does not radiate Onset quality:  Gradual Duration:  1 week Timing:  Intermittent Progression:  Unchanged Chronicity:  New Context: lifting heavy objects   Relieved by:  Nothing Worsened by:  Movement Ineffective treatments:  OTC medications (Tramadol) Associated symptoms: no abdominal pain, no bladder incontinence, no bowel incontinence, no chest pain, no dysuria, no fever, no numbness, no paresthesias, no perianal numbness, no tingling and  no weakness       Home Medications Prior to Admission medications   Medication Sig Start Date End Date Taking? Authorizing Provider  diclofenac Sodium (VOLTAREN) 1 % GEL Apply 2 g topically 4 (four) times daily. 11/14/21  Yes Shuan Statzer A, PA-C  acetaminophen (TYLENOL) 650 MG CR tablet Take 650 mg by mouth every 6 (six) hours as needed for pain. Displaced intertrochanteric fracture of right femur    [provider]  amLODipine (NORVASC) 5 MG tablet Take 1 tablet (5 mg total) by mouth daily. 09/02/21   Minus Breeding, MD  ASPIRIN LOW DOSE 81 MG EC tablet TAKE 1 TABLET TWICE DAILY 10/16/21   Loman Brooklyn, FNP  metoprolol tartrate (LOPRESSOR) 50 MG tablet Take 1.5 tablets (75 mg total) by mouth 2 (two) times daily. 11/13/21   Loman Brooklyn, FNP  omeprazole (PRILOSEC) 20 MG capsule TAKE 1 CAPSULE EVERY DAY 09/17/21   Loman Brooklyn, FNP  predniSONE (DELTASONE) 20 MG tablet Take 1 tablet (20 mg total) by mouth daily with breakfast for 5 days. 11/10/21 11/15/21  Loman Brooklyn, FNP  simvastatin (ZOCOR) 20 MG tablet TAKE 1 TABLET EVERY DAY 10/16/21   Loman Brooklyn, FNP  torsemide (DEMADEX) 20 MG tablet Take 1 tablet (20 mg total) by mouth 2 (two) times daily. 10/05/21   Loman Brooklyn, FNP  traMADol (ULTRAM) 50 MG tablet Take 0.5-1 tablets (25-50 mg total) by mouth 2 (two) times daily as needed. 11/13/21   Loman Brooklyn, FNP  Allergies    Codeine, Doxycycline, Triamterene-hctz, Atorvastatin, Crestor [rosuvastatin calcium], Morphine, and Risedronate sodium    Review of Systems   Review of Systems  Constitutional:  Negative for chills and fever.  Respiratory:  Negative for shortness of breath.   Cardiovascular:  Negative for chest pain.  Gastrointestinal:  Negative for abdominal pain, bowel incontinence, nausea and vomiting.  Genitourinary:  Negative for bladder incontinence, dysuria and hematuria.  Musculoskeletal:  Positive for back pain.  Skin:  Negative for rash.   Neurological:  Negative for tingling, weakness, numbness and paresthesias.  All other systems reviewed and are negative.  Physical Exam Updated Vital Signs BP (!) 128/91    Pulse 90    Temp 97.6 F (36.4 C) (Oral)    Resp (!) 25    Ht 5\' 1"  (1.549 m)    Wt 49 kg    SpO2 98%    BMI 20.41 kg/m  Physical Exam Vitals and nursing note reviewed.  Constitutional:      General: She is not in acute distress.    Appearance: She is not diaphoretic.  HENT:     Head: Normocephalic and atraumatic.     Mouth/Throat:     Pharynx: No oropharyngeal exudate.  Eyes:     General: No scleral icterus.    Conjunctiva/sclera: Conjunctivae normal.  Cardiovascular:     Rate and Rhythm: Tachycardia present. Rhythm irregularly irregular.     Pulses: Normal pulses.     Heart sounds: Normal heart sounds.     Comments: Irregularly irregular rhythm.  Pulmonary:     Effort: Pulmonary effort is normal. No respiratory distress.     Breath sounds: Normal breath sounds. No wheezing.  Abdominal:     General: Bowel sounds are normal.     Palpations: Abdomen is soft. There is no mass.     Tenderness: There is no abdominal tenderness. There is no guarding or rebound.     Comments: No CVA tenderness bilaterally.  Musculoskeletal:        General: Normal range of motion.     Cervical back: Normal range of motion and neck supple.     Comments: Mild tenderness to palpation to lumbar bilateral musculature.  No overlying skin changes.  No C, T, L, S spinal tenderness to palpation.  Negative straight leg raise bilaterally.  DP and PT pulses intact bilaterally.  Sensation intact to bilateral lower extremities.   Skin:    General: Skin is warm and dry.  Neurological:     Mental Status: She is alert.  Psychiatric:        Behavior: Behavior normal.    ED Results / Procedures / Treatments   Labs (all labs ordered are listed, but only abnormal results are displayed) Labs Reviewed  BASIC METABOLIC PANEL - Abnormal;  Notable for the following components:      Result Value   Potassium 2.9 (*)    Glucose, Bld 105 (*)    BUN 24 (*)    Calcium 8.7 (*)    All other components within normal limits  CBC WITH DIFFERENTIAL/PLATELET - Abnormal; Notable for the following components:   MCV 100.7 (*)    Platelets 136 (*)    All other components within normal limits  URINALYSIS, ROUTINE W REFLEX MICROSCOPIC    EKG None  Radiology No results found.  Procedures Procedures    Medications Ordered in ED Medications  lidocaine (LIDODERM) 5 % 1 patch (1 patch Transdermal Patch Applied 11/14/21 1039)  acetaminophen (TYLENOL)  tablet 650 mg (650 mg Oral Given 11/14/21 1036)  traMADol (ULTRAM) tablet 25 mg (25 mg Oral Given 11/14/21 1036)  potassium chloride (KLOR-CON) packet 40 mEq (40 mEq Oral Given 11/14/21 1219)    ED Course/ Medical Decision Making/ A&P Clinical Course as of 11/14/21 1238  Sat Nov 14, 2021  1006 Shared decision making with patient and daughter at bedside.  Patient and daughter would like to forego Robaxin at this time.  Opted for prescription home dose of tramadol, lidoderm patch, and tylenol. Agreeable to obtaining labs at this time. Agreeable to discharge treatment plan of voltaren gel. [SB]  1133 Patient reevaluated and noted slight improvement of back pain with Lidoderm patch. [SB]  1153 Potassium(!): 2.9 Notified patient of low potassium, will replete with potassium powder in the ED [SB]  1153 Discussed with patient and daughter at bedside discharge treatment plan.  Both agreeable at this time to discharge treatment plan.  Patient appears safe for discharge. [SB]    Clinical Course User Index [SB] Westin Knotts A, PA-C                           Medical Decision Making  Patient with bilateral lumbar back pain without radiation. Pt without history of sciatica. Vital signs stable, pt afebrile. No neurological deficits and normal neuro exam.  Patient is ambulatory without assistance or  difficulty. Patient ambulates with a cane at baseline and didn't bring her cane into the ED today.  No loss of bowel or bladder control.  No concern for cauda equina.  No fever, night sweats, weight loss, h/o cancer, IVDA, no recent procedure to back. No urinary symptoms suggestive of UTI.  On exam, patient with TTP to bilateral lumbar musculature. Negative SLR bilaterally. No concerning findings on exams. Differential diagnosis includes but is not limited to fracture, muscle strain, cauda equina, kidney stone, UTI.   Co-morbidities:  Pt with known a-fib that is managed by her PCP with metoprolol.  Pt without tachycardia at discharge.   Labs:  I ordered, and personally interpreted labs.  The pertinent results include:   Potassium at 2.9, decreased from 3.6 yesterday. Otherwise, BMP unremarkable.  CBC unremarkable.   Imaging:  No imaging indicated at this time due to absence of red flag symptoms. Pt with recent lumbar xray completed on 11/10/2021 with no acute fracture or dislocation.   Additional history obtained: External records from outside source obtained and reviewed including:  Per chart review: Patient was evaluated by her primary care provider on 11/10/2021 and 11/13/2021.  On the initial visit she was prescribed tramadol to aid with her lower back pain.  Patient was given a steroid injection in the office yesterday.  However, on her most recent visit with her primary care provider yesterday, patient reports that she has not been taking the tramadol.   Medicines ordered and prescription drug management: I ordered medication including lidoderm patch, tylenol, and home dose tramadol for lower back pain.  Potassium repleted in the ED due to hypokalemia.  Reevaluation of the patient after these medicines showed that the patient improved I have reviewed the patients home medicines and have made adjustments as needed  Reevaluation: After the interventions noted above, I reevaluated the patient  and found that they have :improved   Disposition: After consideration of the diagnostic results and the patients response to treatment, I feel that the patent would benefit from discharge home with voltaren gel and continue use of Rx  Tramadol.  Pt takes torsemide, instructed patient on continuing with her potassium supplements and potassium rich food intake as instructed by her primary care provider. Supportive care measures and strict return precautions discussed with patient and daughter at bedside.  Patient and daughter acknowledged and verbalized understanding.  Patient appears safe for discharge at this time.  Follow-up as indicated in discharge paperwork.    This chart was dictated using voice recognition software, Dragon. Despite the best efforts of this provider to proofread and correct errors, errors may still occur which can change documentation meaning.  Final Clinical Impression(s) / ED Diagnoses Final diagnoses:  Acute bilateral low back pain without sciatica    Rx / DC Orders ED Discharge Orders          Ordered    diclofenac Sodium (VOLTAREN) 1 % GEL  4 times daily        11/14/21 1152              Dae Antonucci A, PA-C 11/14/21 1242    Davonna Belling, MD 11/16/21 [phone]

## 2021-11-16 ENCOUNTER — Telehealth: Payer: Self-pay | Admitting: Family Medicine

## 2021-11-16 NOTE — Telephone Encounter (Signed)
Covering PCP- please advise  

## 2021-11-16 NOTE — Telephone Encounter (Signed)
It looks like on 1/6 [place] sent in some tramadol and then she also has Voltaren gel, so I would use those first since she was sent those medicines.  If that is not working then please have her come in for an appointment we can discuss options but for now I would try those medicines first and she just got those.

## 2021-11-17 NOTE — Telephone Encounter (Signed)
Spoke with patient. She has been taking 1/2 of Tramadol in am, 1/2 and lunch and then whole in pm.  Pt states that Tramadol and gel have helped but sometimes her back pain is still really bad.  Instructed pt to try taking whole tramadol in am and whole at night. Rotate heat and ice. Also continue the Diclofenac.  Pt will call back if no better.

## 2021-11-20 ENCOUNTER — Ambulatory Visit (INDEPENDENT_AMBULATORY_CARE_PROVIDER_SITE_OTHER): Payer: Medicare HMO | Admitting: Family Medicine

## 2021-11-20 ENCOUNTER — Telehealth: Payer: Self-pay | Admitting: Family Medicine

## 2021-11-20 ENCOUNTER — Encounter: Payer: Self-pay | Admitting: Family Medicine

## 2021-11-20 VITALS — BP 118/83 | HR 93 | Ht 61.0 in | Wt 107.0 lb

## 2021-11-20 DIAGNOSIS — M545 Low back pain, unspecified: Secondary | ICD-10-CM | POA: Diagnosis not present

## 2021-11-20 DIAGNOSIS — M5136 Other intervertebral disc degeneration, lumbar region: Secondary | ICD-10-CM

## 2021-11-20 MED ORDER — TRAMADOL HCL 50 MG PO TABS: 25.0000 mg | ORAL_TABLET | Freq: Two times a day (BID) | ORAL | 2 refills | Status: DC | PRN

## 2021-11-20 NOTE — Progress Notes (Signed)
BP 118/83    Pulse 93    Ht 5\' 1"  (1.549 m)    Wt 107 lb (48.5 kg)    SpO2 97%    BMI 20.22 kg/m    Subjective:   Patient ID: Norma Barajas, female    DOB: [birth year], 81 y.o.   MRN: [id number]  HPI: Norma Barajas is a 81 y.o. female presenting on 11/20/2021 for No chief complaint on file.   HPI Back pain She is taking acetaminophen and tramadol and baclofen. She is having some improvement with back pain.  The pain is stabbing along lower back.  Moving makes it worse.  She is also taking heating pads and is becoming more active.  She is able to sit up more and her core strength does seem to be a little bit better and is more functional her knees with these medicines but is still not to where she would like to be and is still having pain.  She says her pain is moderate to severe and is there most of the time.  It is worse with being up and moving around but that has improved over the past week.  Relevant past medical, surgical, family and social history reviewed and updated as indicated. Interim medical history since our last visit reviewed. Allergies and medications reviewed and updated.  Review of Systems  Constitutional:  Negative for chills and fever.  Eyes:  Negative for visual disturbance.  Respiratory:  Negative for chest tightness and shortness of breath.   Cardiovascular:  Negative for chest pain and leg swelling.  Musculoskeletal:  Positive for arthralgias, back pain and myalgias. Negative for gait problem.  Skin:  Negative for rash.  Neurological:  Negative for light-headedness and headaches.  Psychiatric/Behavioral:  Negative for agitation and behavioral problems.   All other systems reviewed and are negative.  Per HPI unless specifically indicated above   Allergies as of 11/20/2021       Reactions   Codeine Nausea And Vomiting   Doxycycline Other (See Comments)   Chest congestion   Triamterene-hctz Other (See Comments)   weakness   Atorvastatin Other (See Comments)    Myalgias   Crestor [rosuvastatin Calcium] Other (See Comments)   weakness   Morphine Nausea Only   Risedronate Sodium Other (See Comments)   ACTONEL - reflux        Medication List        Accurate as of November 20, 2021  9:51 AM. If you have any questions, ask your nurse or doctor.          acetaminophen 650 MG CR tablet Commonly known as: TYLENOL Take 650 mg by mouth every 6 (six) hours as needed for pain. Displaced intertrochanteric fracture of right femur   amLODipine 5 MG tablet Commonly known as: NORVASC Take 1 tablet (5 mg total) by mouth daily.   Aspirin Low Dose 81 MG EC tablet Generic drug: aspirin TAKE 1 TABLET TWICE DAILY   diclofenac Sodium 1 % Gel Commonly known as: Voltaren Apply 2 g topically 4 (four) times daily.   metoprolol tartrate 50 MG tablet Commonly known as: LOPRESSOR Take 1.5 tablets (75 mg total) by mouth 2 (two) times daily.   omeprazole 20 MG capsule Commonly known as: PRILOSEC TAKE 1 CAPSULE EVERY DAY   simvastatin 20 MG tablet Commonly known as: ZOCOR TAKE 1 TABLET EVERY DAY   torsemide 20 MG tablet Commonly known as: DEMADEX Take 1 tablet (20 mg total) by mouth 2 (two)  times daily.   traMADol 50 MG tablet Commonly known as: ULTRAM Take 0.5-1 tablets (25-50 mg total) by mouth 2 (two) times daily as needed.         Objective:   BP 118/83    Pulse 93    Ht 5\' 1"  (1.549 m)    Wt 107 lb (48.5 kg)    SpO2 97%    BMI 20.22 kg/m   Wt Readings from Last 3 Encounters:  11/20/21 107 lb (48.5 kg)  11/14/21 108 lb (49 kg)  11/13/21 108 lb 6.4 oz (49.2 kg)    Physical Exam Vitals and nursing note reviewed.  Constitutional:      General: She is not in acute distress.    Appearance: She is well-developed. She is not diaphoretic.  Eyes:     Conjunctiva/sclera: Conjunctivae normal.  Cardiovascular:     Rate and Rhythm: Normal rate and regular rhythm.     Heart sounds: Normal heart sounds. No murmur heard. Pulmonary:      Effort: Pulmonary effort is normal. No respiratory distress.     Breath sounds: Normal breath sounds. No wheezing.  Musculoskeletal:     Lumbar back: Deformity (Kyphosis) and tenderness present. No bony tenderness. Decreased range of motion. Negative right straight leg raise test and negative left straight leg raise test.  Skin:    General: Skin is warm and dry.     Findings: No rash.  Neurological:     Mental Status: She is alert and oriented to person, place, and time.     Coordination: Coordination normal.  Psychiatric:        Behavior: Behavior normal.      Assessment & Plan:   Problem List Items Addressed This Visit       Other   Lumbar pain - Primary   Relevant Medications   traMADol (ULTRAM) 50 MG tablet   Other Relevant Orders   Ambulatory referral to Physical Therapy   Other Visit Diagnoses     Degenerative disc disease, lumbar       Relevant Medications   traMADol (ULTRAM) 50 MG tablet   Other Relevant Orders   Ambulatory referral to Physical Therapy       We will continue the tramadol, seems to be doing okay for her, will also do physical therapy. Follow up plan: Return in about 3 months (around 02/18/2022), or if symptoms worsen or fail to improve, for Recheck on pain and tramadol and cholesterol.  Counseling provided for all of the vaccine components Orders Placed This Encounter  Procedures   Ambulatory referral to Physical Therapy    Caryl Pina, MD [facility] 11/20/2021, 9:51 AM

## 2021-11-20 NOTE — Progress Notes (Signed)
Norma Barajas is a 81 y.o. female who presents today following up from her visit to the ED due to back pain. She was diagnosed with arthritis and prescribed acetaminophen, tramadol, and voltaren gel. She says these things have been helping however shows interest in "arthritis medicine" that was prescribed to an acquaintance. She does not know what medication this was however. She has also been using heating pads which also seem to help. Her pain is stabbing and located in her lower back, and it is constant throughout the day. She says that moving around makes it worse.   ROS Patient denies numbness and shooting pains and admits to some lower extremity weakness.  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 Norma Barajas was instructed to continue her current regimen and is being referred to PT for strengthening exercises.

## 2021-11-20 NOTE — Telephone Encounter (Signed)
Message received that the Tramadol prescription written today by Dr. Warrick Parisian failed to go through to the pharmacy due to issues at the pharmacy. I am sending the prescription now since the previous prescribing provider has left for the day and it is the weekend.

## 2021-11-23 ENCOUNTER — Other Ambulatory Visit: Payer: Self-pay | Admitting: Family Medicine

## 2021-11-23 DIAGNOSIS — R944 Abnormal results of kidney function studies: Secondary | ICD-10-CM

## 2021-11-25 ENCOUNTER — Other Ambulatory Visit: Payer: Medicare HMO

## 2021-11-25 DIAGNOSIS — R944 Abnormal results of kidney function studies: Secondary | ICD-10-CM

## 2021-11-26 LAB — BMP8+EGFR
BUN/Creatinine Ratio: 18 (ref 12–28)
BUN: 21 mg/dL (ref 8–27)
CO2: 29 mmol/L (ref 20–29)
Calcium: 9.5 mg/dL (ref 8.7–10.3)
Chloride: 94 mmol/L — ABNORMAL LOW (ref 96–106)
Creatinine, Ser: 1.19 mg/dL — ABNORMAL HIGH (ref 0.57–1.00)
Glucose: 100 mg/dL — ABNORMAL HIGH (ref 70–99)
Potassium: 3.7 mmol/L (ref 3.5–5.2)
Sodium: 141 mmol/L (ref 134–144)
eGFR: 46 mL/min/{1.73_m2} — ABNORMAL LOW (ref >59)

## 2021-11-27 ENCOUNTER — Other Ambulatory Visit: Payer: Self-pay | Admitting: Family Medicine

## 2021-11-27 DIAGNOSIS — N1831 Chronic kidney disease, stage 3a: Secondary | ICD-10-CM

## 2021-12-14 ENCOUNTER — Other Ambulatory Visit (HOSPITAL_COMMUNITY): Payer: Self-pay | Admitting: Nephrology

## 2021-12-14 ENCOUNTER — Other Ambulatory Visit: Payer: Self-pay | Admitting: Nephrology

## 2021-12-14 DIAGNOSIS — D696 Thrombocytopenia, unspecified: Secondary | ICD-10-CM | POA: Diagnosis not present

## 2021-12-14 DIAGNOSIS — N1831 Chronic kidney disease, stage 3a: Secondary | ICD-10-CM | POA: Diagnosis not present

## 2021-12-14 DIAGNOSIS — R718 Other abnormality of red blood cells: Secondary | ICD-10-CM | POA: Diagnosis not present

## 2021-12-14 DIAGNOSIS — I129 Hypertensive chronic kidney disease with stage 1 through stage 4 chronic kidney disease, or unspecified chronic kidney disease: Secondary | ICD-10-CM | POA: Diagnosis not present

## 2021-12-14 DIAGNOSIS — I5032 Chronic diastolic (congestive) heart failure: Secondary | ICD-10-CM | POA: Diagnosis not present

## 2021-12-14 DIAGNOSIS — Z7189 Other specified counseling: Secondary | ICD-10-CM | POA: Diagnosis not present

## 2021-12-15 ENCOUNTER — Telehealth: Payer: Self-pay | Admitting: Family Medicine

## 2021-12-15 NOTE — Telephone Encounter (Signed)
Yes, but this is going to make her prescription run out early. She will need to come in for an appointment to correct the prescription prior to running out. We also need to complete a controlled substance agreement and urine drug screen.

## 2021-12-15 NOTE — Telephone Encounter (Signed)
Pt aware of going up to 3 a day and scheduled appt for tomorrow 2/8

## 2021-12-16 ENCOUNTER — Encounter: Payer: Self-pay | Admitting: Family Medicine

## 2021-12-16 ENCOUNTER — Ambulatory Visit (INDEPENDENT_AMBULATORY_CARE_PROVIDER_SITE_OTHER): Payer: Medicare HMO | Admitting: Family Medicine

## 2021-12-16 VITALS — BP 113/80 | HR 89 | Temp 97.1°F | Ht 61.0 in | Wt 99.8 lb

## 2021-12-16 DIAGNOSIS — Z79899 Other long term (current) drug therapy: Secondary | ICD-10-CM | POA: Diagnosis not present

## 2021-12-16 DIAGNOSIS — M545 Low back pain, unspecified: Secondary | ICD-10-CM

## 2021-12-16 DIAGNOSIS — M5136 Other intervertebral disc degeneration, lumbar region: Secondary | ICD-10-CM

## 2021-12-16 MED ORDER — TRAMADOL HCL 50 MG PO TABS: 100.0000 mg | ORAL_TABLET | Freq: Two times a day (BID) | ORAL | 2 refills | Status: DC | PRN

## 2021-12-16 NOTE — Progress Notes (Signed)
Assessment & Plan:  1-3. Degenerative disc disease, lumbar/Lumbar pain/Controlled substance agreement signed Tramadol dose increased from 50 mg twice daily to 100 mg twice daily since patient was advised she has to cut down on her Tylenol.  Discussed she can either take 1 or 2 tablets at a time.  Controlled substance agreement signed today.  Unable to pay for a urine drug screen, so a blood drug screen was collected.  PDMP reviewed with no concerning findings. - traMADol (ULTRAM) 50 MG tablet; Take 2 tablets (100 mg total) by mouth 2 (two) times daily as needed (pain).  Dispense: 120 tablet; Refill: 2 - Drug Screen 10 W/Conf, Se   Return as scheduled.  [name], MSN, APRN, FNP-C [facility]  Subjective:    Patient ID: Norma Barajas, female    DOB: Nov 26, 1940, 81 y.o.   MRN: [id number]  Patient Care Team: Loman Brooklyn, FNP as PCP - General (Family Medicine) Minus Breeding, MD as PCP - Cardiology (Cardiology) [name], MD (Inactive) as Attending Physician (Gastroenterology) Melina Schools, OD as Consulting Physician (Optometry) Sherran Needs, NP as Consulting Physician (Cardiology)   Chief Complaint:  Chief Complaint  Patient presents with   Medication Refill    HPI: Norma Barajas is a 81 y.o. female presenting on 12/16/2021 for Medication Refill  Patient is accompanied by her niece who she is okay with being present.  Pain assessment: Cause of pain- lumbar degenerative disc disease Lumbar spine x-ray 11/10/2021: Anterolisthesis of L3 on L4 and L4 on L5. Pain location-low Pain on scale of 1-10- 10/10 without medication Frequency- daily What increases pain- coughing, sneezing, movement What makes pain better- rest, heating pad, Tramadol, Tylenol, Voltaren gel Effects on ADL - impairs ability to perform ADLs Any change in general medical condition- new CKD; she has been advised by her nephrologist that she has to cut down on the amount of  Tylenol she is taking daily.  Current opioids rx- Tramadol 50 mg BID PRN # meds rx- 60 Effectiveness of current meds- somewhat helpful Adverse reactions from pain meds- none Morphine equivalent- 10 MME/day  Pill count performed - No Last drug screen - never ( high risk q67m moderate risk q645mlow risk yearly ) Urine drug screen today- Yes Was the [place]- Yes If yes were their any concerning findings? - No  Overdose risk: 210  Opioid Risk  12/16/2021  Alcohol 3  Illegal Drugs 3  Rx Drugs 0  Alcohol 0  Illegal Drugs 0  Rx Drugs 0  Age between 16-45 years  0  History of Preadolescent Sexual Abuse 0  Psychological Disease 0  Depression 0  Opioid Risk Tool Scoring 6  Opioid Risk Interpretation Moderate Risk   Pain contract signed on: 12/16/2021   New complaints: None   Social history:  Relevant past medical, surgical, family and social history reviewed and updated as indicated. Interim medical history since our last visit reviewed.  Allergies and medications reviewed and updated.  DATA REVIEWED: CHART IN EPIC  ROS: Negative unless specifically indicated above in HPI.    Current Outpatient Medications:    acetaminophen (TYLENOL) 650 MG CR tablet, Take 650 mg by mouth every 6 (six) hours as needed for pain. Displaced intertrochanteric fracture of right femur, Disp: , Rfl:    amLODipine (NORVASC) 5 MG tablet, Take 1 tablet (5 mg total) by mouth daily., Disp: 90 tablet, Rfl: 3   ASPIRIN LOW DOSE 81 MG EC tablet, TAKE  1 TABLET TWICE DAILY, Disp: 180 tablet, Rfl: 1   diclofenac Sodium (VOLTAREN) 1 % GEL, Apply 2 g topically 4 (four) times daily., Disp: 50 g, Rfl: 0   metoprolol tartrate (LOPRESSOR) 50 MG tablet, Take 1.5 tablets (75 mg total) by mouth 2 (two) times daily., Disp: 270 tablet, Rfl: 1   omeprazole (PRILOSEC) 20 MG capsule, TAKE 1 CAPSULE EVERY DAY, Disp: 90 capsule, Rfl: 1   simvastatin (ZOCOR) 20 MG tablet, TAKE 1 TABLET EVERY DAY, Disp: 90 tablet, Rfl:  0   torsemide (DEMADEX) 20 MG tablet, Take 1 tablet (20 mg total) by mouth 2 (two) times daily., Disp: 180 tablet, Rfl: 1   traMADol (ULTRAM) 50 MG tablet, Take 0.5-1 tablets (25-50 mg total) by mouth 2 (two) times daily as needed., Disp: 60 tablet, Rfl: 2   Allergies  Allergen Reactions   Codeine Nausea And Vomiting   Doxycycline Other (See Comments)    Chest congestion   Triamterene-Hctz Other (See Comments)    weakness   Atorvastatin Other (See Comments)    Myalgias    Crestor [Rosuvastatin Calcium] Other (See Comments)    weakness   Morphine Nausea Only   Risedronate Sodium Other (See Comments)    ACTONEL - reflux   Past Medical History:  Diagnosis Date   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 Nonobstructive minimal CAD 10/2005.   Depression    Diastolic dysfunction    Grade 1. Ejection fraction 60-65%.   Dysrhythmia    a fib   Erosive esophagitis    Essential hypertension    Fracture of ramus of right pubis with routine healing 12/09/2020   GERD (gastroesophageal reflux disease)    Grade III hemorrhoids    History of hiatal hernia    Hypercholesterolemia    Internal hemorrhoids with complication 0/25/8527   OCT 2015 FLEX SIG/IH BANDING     Intertrochanteric fracture of femur ([place])    5/16 - 03/26/2021 fracture sustained in a mechanical fall.  IM nailing performed 5/17 by Dr Larena Glassman Warfarin held because of posttraumatic pelvic hematoma.  Postop DVT with 81 mg aspirin twice daily x28 days.   Major depressive disorder, recurrent, moderate ([place]) 12/09/2020   Migraine    "used to have them right bad; I don't now" (04/11/2015)   MITRAL REGURGITATION 04/27/2010   Qualifier: Diagnosis of  By: [name], MD, [name]    Osteoporosis    Paroxysmal atrial fibrillation [facility])    Pelvic  hematoma, female 03/27/2021   Sustained in mechanical fall.  Warfarin held.  IM nailing performed 5/17 with postop DVT prophylaxis 81 mg twice daily x28 days.   PONV (postoperative nausea and vomiting)    Vertigo     Past Surgical History:  Procedure Laterality Date   BRAVO [facility] STUDY  11/15/2012   Procedure: BRAVO [place];  Surgeon: [name], MD;  [facility];  Service: Endoscopy;;   CARDIOVERSION N/A 05/16/2020   Procedure: CARDIOVERSION;  Surgeon: Donato Heinz, MD;  Location: [place];  Service: Cardiovascular;  Laterality: N/A;   CATARACT EXTRACTION W/ INTRAOCULAR LENS  IMPLANT, BILATERAL Bilateral    COLONOSCOPY  2008   Dr. [name]: internal hemorrhoids    DILATION AND POEUMPNTI RW ERXVQM  ESOPHAGEAL DILATION N/A 06/16/2021   Procedure: ESOPHAGEAL DILATION;  Surgeon: Harvel Quale, MD;  [facility];  Service: Gastroenterology;  Laterality: N/A;   ESOPHAGOGASTRODUODENOSCOPY (EGD) WITH ESOPHAGEAL DILATION  2001   Dr. [name]: erosive esophagitis, esophageal stricture, duodenitis, s/p Savary dilation   ESOPHAGOGASTRODUODENOSCOPY (EGD) WITH ESOPHAGEAL DILATION  11/15/2012   [facility]:HENIDPOEUM web was found & MOST LIKELY CAUSE FOR DYAPHAGIA/Polyp was found in the gastric body and gastric fundus/ gastritis on bx   ESOPHAGOGASTRODUODENOSCOPY (EGD) WITH PROPOFOL N/A 06/16/2021   Procedure: ESOPHAGOGASTRODUODENOSCOPY (EGD) WITH PROPOFOL;  Surgeon: Harvel Quale, MD;  [facility];  Service: Gastroenterology;  Laterality: N/A;  8:15   EYE SURGERY Bilateral    "laser OR after cataract OR; cause I couldn't see"   FLEXIBLE SIGMOIDOSCOPY N/A 08/22/2014   mild diverticulosis in sigmoid, moderate sized Grade 3 hemorrhoids s/p banding X 3.    FRACTURE SURGERY Right    below the knee - 2 bones broke has plates   HEMORRHOID BANDING N/A 08/22/2014   Procedure: HEMORRHOID BANDING;  Surgeon: [name], MD;  [facility];   Service: Endoscopy;  Laterality: N/A;   HEMORRHOID SURGERY N/A 03/30/2018   Procedure: EXTENSIVE HEMORRHOIDECTOMY;  Surgeon: [name], MD;  Location: [facility];  Service: General;  Laterality: N/A;   INTRAMEDULLARY (IM) NAIL INTERTROCHANTERIC Right 03/24/2021   Procedure: INTRAMEDULLARY (IM) NAIL INTERTROCHANTRIC;  Surgeon: Mordecai Rasmussen, MD;  Location: [facility];  Service: Orthopedics;  Laterality: Right;   OPEN REDUCTION INTERNAL FIXATION (ORIF) TIBIA/FIBULA FRACTURE Right 2013   broke tibia and fibula after falling down stairs   PROLAPSED UTERINE FIBROID LIGATION  2015   TOTAL ABDOMINAL HYSTERECTOMY      Social History   Socioeconomic History   Marital status: Married    Spouse name: george    Number of children: 1   Years of education: Not on file   Highest education level: Not on file  Occupational History   Occupation: Retired    Comment: Textile  Tobacco Use   Smoking status: Never   Smokeless tobacco: Never  Scientific laboratory technician Use: Never used  Substance and Sexual Activity   Alcohol use: No    Alcohol/week: 0.0 standard drinks   Drug use: No   Sexual activity: Not Currently    Birth control/protection: Surgical, Post-menopausal    Comment: hyst  Other Topics Concern   Not on file  Social History Narrative   Married   No regular exercise   Social Determinants of Health   Financial Resource Strain: Not on file  Food Insecurity: Not on file  Transportation Needs: Not on file  Physical Activity: Not on file  Stress: Not on file  Social Connections: Not on file  Intimate Partner Violence: Not on file        Objective:    BP 113/80    Pulse 89    Temp (!) 97.1 F (36.2 C)    Ht 5' 1"  (1.549 m)    Wt 99 lb 12.8 oz (45.3 kg)    SpO2 95%    BMI 18.86 kg/m   Wt Readings from Last 3 Encounters:  12/16/21 99 lb 12.8 oz (45.3 kg)  11/20/21 107 lb (48.5 kg)  11/14/21 108 lb (49 kg)    Physical Exam Vitals reviewed.  Constitutional:      General: She is  not in acute distress.    Appearance: Normal appearance. She is underweight. She is not ill-appearing,  toxic-appearing or diaphoretic.  HENT:     Head: Normocephalic and atraumatic.  Eyes:     General: No scleral icterus.       Right eye: No discharge.        Left eye: No discharge.     Conjunctiva/sclera: Conjunctivae normal.  Cardiovascular:     Rate and Rhythm: Normal rate.  Pulmonary:     Effort: Pulmonary effort is normal. No respiratory distress.  Musculoskeletal:        General: Normal range of motion.     Cervical back: Normal range of motion.     Lumbar back: Tenderness and bony tenderness present.  Skin:    General: Skin is warm and dry.     Capillary Refill: Capillary refill takes less than 2 seconds.  Neurological:     General: No focal deficit present.     Mental Status: She is alert and oriented to person, place, and time. Mental status is at baseline.  Psychiatric:        Mood and Affect: Mood normal.        Behavior: Behavior normal.        Thought Content: Thought content normal.        Judgment: Judgment normal.    Lab Results  Component Value Date   TSH 2.540 06/09/2021   Lab Results  Component Value Date   WBC 9.1 11/14/2021   HGB 14.5 11/14/2021   HCT 43.2 11/14/2021   MCV 100.7 (H) 11/14/2021   PLT 136 (L) 11/14/2021   Lab Results  Component Value Date   NA 141 11/25/2021   K 3.7 11/25/2021   CO2 29 11/25/2021   GLUCOSE 100 (H) 11/25/2021   BUN 21 11/25/2021   CREATININE 1.19 (H) 11/25/2021   BILITOT 0.6 08/12/2021   ALKPHOS 158 (H) 08/12/2021   AST 19 08/12/2021   ALT 10 08/12/2021   PROT 7.1 08/12/2021   ALBUMIN 4.5 08/12/2021   CALCIUM 9.5 11/25/2021   ANIONGAP 10 11/14/2021   EGFR 46 (L) 11/25/2021   Lab Results  Component Value Date   CHOL 130 08/12/2021   Lab Results  Component Value Date   HDL 51 08/12/2021   Lab Results  Component Value Date   LDLCALC 62 08/12/2021   Lab Results  Component Value Date   TRIG 92  08/12/2021   Lab Results  Component Value Date   CHOLHDL 2.5 08/12/2021   Lab Results  Component Value Date   HGBA1C 5.6 02/28/2017

## 2021-12-20 ENCOUNTER — Encounter: Payer: Self-pay | Admitting: Family Medicine

## 2021-12-20 DIAGNOSIS — Z79899 Other long term (current) drug therapy: Secondary | ICD-10-CM | POA: Insufficient documentation

## 2021-12-20 DIAGNOSIS — M5136 Other intervertebral disc degeneration, lumbar region: Secondary | ICD-10-CM | POA: Insufficient documentation

## 2021-12-21 LAB — DRUG SCREEN 10 W/CONF, SERUM
Amphetamines, IA: NEGATIVE ng/mL
Barbiturates, IA: NEGATIVE ug/mL
Benzodiazepines, IA: NEGATIVE ng/mL
Cocaine & Metabolite, IA: NEGATIVE ng/mL
Methadone, IA: NEGATIVE ng/mL
Opiates, IA: NEGATIVE ng/mL
Oxycodones, IA: NEGATIVE ng/mL
Phencyclidine, IA: NEGATIVE ng/mL
Propoxyphene, IA: NEGATIVE ng/mL
THC(Marijuana) Metabolite, IA: NEGATIVE ng/mL

## 2021-12-22 ENCOUNTER — Ambulatory Visit (HOSPITAL_COMMUNITY)
Admission: RE | Admit: 2021-12-22 | Discharge: 2021-12-22 | Disposition: A | Discharge status: Home / Self Care | Payer: Medicare HMO | Source: Ambulatory Visit | Attending: Nephrology | Admitting: Nephrology

## 2021-12-22 ENCOUNTER — Other Ambulatory Visit: Payer: Self-pay

## 2021-12-22 DIAGNOSIS — I129 Hypertensive chronic kidney disease with stage 1 through stage 4 chronic kidney disease, or unspecified chronic kidney disease: Secondary | ICD-10-CM | POA: Diagnosis not present

## 2021-12-22 DIAGNOSIS — Z79899 Other long term (current) drug therapy: Secondary | ICD-10-CM | POA: Diagnosis not present

## 2021-12-22 DIAGNOSIS — N189 Chronic kidney disease, unspecified: Secondary | ICD-10-CM | POA: Diagnosis not present

## 2021-12-22 DIAGNOSIS — N1831 Chronic kidney disease, stage 3a: Secondary | ICD-10-CM | POA: Insufficient documentation

## 2021-12-22 DIAGNOSIS — I5032 Chronic diastolic (congestive) heart failure: Secondary | ICD-10-CM | POA: Diagnosis not present

## 2021-12-22 DIAGNOSIS — R718 Other abnormality of red blood cells: Secondary | ICD-10-CM | POA: Diagnosis not present

## 2021-12-23 ENCOUNTER — Encounter: Payer: Self-pay | Admitting: Family Medicine

## 2021-12-24 ENCOUNTER — Ambulatory Visit (INDEPENDENT_AMBULATORY_CARE_PROVIDER_SITE_OTHER): Payer: Medicare HMO | Admitting: Family Medicine

## 2021-12-24 ENCOUNTER — Encounter: Payer: Self-pay | Admitting: Family Medicine

## 2021-12-24 VITALS — BP 117/81 | HR 94 | Temp 98.1°F | Ht 61.0 in | Wt 99.0 lb

## 2021-12-24 DIAGNOSIS — Z79899 Other long term (current) drug therapy: Secondary | ICD-10-CM

## 2021-12-24 DIAGNOSIS — M545 Low back pain, unspecified: Secondary | ICD-10-CM

## 2021-12-24 DIAGNOSIS — R110 Nausea: Secondary | ICD-10-CM

## 2021-12-24 DIAGNOSIS — M5136 Other intervertebral disc degeneration, lumbar region: Secondary | ICD-10-CM | POA: Diagnosis not present

## 2021-12-24 DIAGNOSIS — K5903 Drug induced constipation: Secondary | ICD-10-CM | POA: Diagnosis not present

## 2021-12-24 MED ORDER — DULCOLAX 5 MG PO TBEC: 5.0000 mg | DELAYED_RELEASE_TABLET | Freq: Every day | ORAL | 0 refills | Status: DC | PRN

## 2021-12-24 MED ORDER — ONDANSETRON 4 MG PO TBDP: 4.0000 mg | ORAL_TABLET | Freq: Three times a day (TID) | ORAL | 0 refills | Status: DC | PRN

## 2021-12-24 MED ORDER — HYDROCODONE-ACETAMINOPHEN 5-325 MG PO TABS: 1.0000 | ORAL_TABLET | Freq: Three times a day (TID) | ORAL | 0 refills | Status: DC | PRN

## 2021-12-24 NOTE — Progress Notes (Signed)
Assessment & Plan:  1-3. Degenerative disc disease, lumbar/Lumbar pain/Controlled substance agreement signed Uncontrolled. Changing from Tramadol to Norco. Controlled substance agreement in place. Blood drug screen collected last week as expected. PDMP reviewed with no concerning findings. MRI ordered today. - HYDROcodone-acetaminophen (NORCO) 5-325 MG tablet; Take 1 tablet by mouth every 8 (eight) hours as needed for moderate pain.  Dispense: 60 tablet; Refill: 0 - MR Lumbar Spine Wo Contrast; Future  4. Nausea without vomiting - ondansetron (ZOFRAN-ODT) 4 MG disintegrating tablet; Take 1 tablet (4 mg total) by mouth every 8 (eight) hours as needed for nausea or vomiting.  Dispense: 30 tablet; Refill: 0  5. Drug induced constipation Encouraged adequate hydration.  - bisacodyl (DULCOLAX) 5 MG EC tablet; Take 1 tablet (5 mg total) by mouth daily as needed for moderate constipation.  Dispense: 30 tablet; Refill: 0   Follow up plan: Return in about 2 weeks (around 01/07/2022) for Pain.  [name], MSN, APRN, FNP-C [facility]  Subjective:   Patient ID: Norma Barajas, female    DOB: 1941-01-30, 81 y.o.   MRN: [id number]  HPI: Norma Barajas is a 81 y.o. female presenting on 12/24/2021 for Back Pain  Patient is accompanied by her husband and her niece, who she is okay with being present.   Patient reports her back pain is unbearable. She was seen last week at which time her Tramadol was increased from 50 mg to 100 mg since the lower dose was not controlling the pain. She reports today the higher dose is still not helping. She feels something more is going on in her back than just arthritis.   She is also concerned about nausea and dry heaving that started this morning. She is now experiencing constipation as a side effect of the Tramadol. She typically has a bowel movement every morning and states she has been two days without one. Denies nausea, diarrhea, and fever.     ROS: Negative unless specifically indicated above in HPI.   Relevant past medical history reviewed and updated as indicated.   Allergies and medications reviewed and updated.   Current Outpatient Medications:    acetaminophen (TYLENOL) 650 MG CR tablet, Take 650 mg by mouth every 6 (six) hours as needed for pain. Displaced intertrochanteric fracture of right femur, Disp: , Rfl:    amLODipine (NORVASC) 5 MG tablet, Take 1 tablet (5 mg total) by mouth daily., Disp: 90 tablet, Rfl: 3   ASPIRIN LOW DOSE 81 MG EC tablet, TAKE 1 TABLET TWICE DAILY, Disp: 180 tablet, Rfl: 1   diclofenac Sodium (VOLTAREN) 1 % GEL, Apply 2 g topically 4 (four) times daily., Disp: 50 g, Rfl: 0   metoprolol tartrate (LOPRESSOR) 50 MG tablet, Take 1.5 tablets (75 mg total) by mouth 2 (two) times daily., Disp: 270 tablet, Rfl: 1   omeprazole (PRILOSEC) 20 MG capsule, TAKE 1 CAPSULE EVERY DAY, Disp: 90 capsule, Rfl: 1   simvastatin (ZOCOR) 20 MG tablet, TAKE 1 TABLET EVERY DAY, Disp: 90 tablet, Rfl: 0   torsemide (DEMADEX) 20 MG tablet, Take 1 tablet (20 mg total) by mouth 2 (two) times daily., Disp: 180 tablet, Rfl: 1   traMADol (ULTRAM) 50 MG tablet, Take 2 tablets (100 mg total) by mouth 2 (two) times daily as needed (pain)., Disp: 120 tablet, Rfl: 2  Allergies  Allergen Reactions   Codeine Nausea And Vomiting   Doxycycline Other (See Comments)    Chest congestion   Triamterene-Hctz Other (See Comments)  weakness   Atorvastatin Other (See Comments)    Myalgias    Crestor [Rosuvastatin Calcium] Other (See Comments)    weakness   Morphine Nausea Only   Risedronate Sodium Other (See Comments)    ACTONEL - reflux    Objective:   BP 117/81    Pulse 94    Temp 98.1 F (36.7 C)    Ht 5\' 1"  (1.549 m)    Wt 99 lb (44.9 kg)    SpO2 90%    BMI 18.71 kg/m    Physical Exam Vitals reviewed.  Constitutional:      General: She is not in acute distress.    Appearance: Normal appearance. She is underweight.  She is ill-appearing. She is not toxic-appearing or diaphoretic.  HENT:     Head: Normocephalic and atraumatic.  Eyes:     General: No scleral icterus.       Right eye: No discharge.        Left eye: No discharge.     Conjunctiva/sclera: Conjunctivae normal.  Cardiovascular:     Rate and Rhythm: Normal rate and regular rhythm.     Heart sounds: Normal heart sounds. No murmur heard.   No friction rub. No gallop.  Pulmonary:     Effort: Pulmonary effort is normal. No respiratory distress.     Breath sounds: Normal breath sounds. No stridor. No wheezing, rhonchi or rales.  Abdominal:     General: Abdomen is flat. Bowel sounds are normal.     Tenderness: There is generalized abdominal tenderness.  Musculoskeletal:        General: Normal range of motion.     Cervical back: Normal range of motion.     Lumbar back: Tenderness and bony tenderness present.  Skin:    General: Skin is warm and dry.     Capillary Refill: Capillary refill takes less than 2 seconds.  Neurological:     General: No focal deficit present.     Mental Status: She is alert and oriented to person, place, and time. Mental status is at baseline.     Gait: Gait abnormal (riding in [facility]).  Psychiatric:        Mood and Affect: Mood normal.        Behavior: Behavior normal.        Thought Content: Thought content normal.        Judgment: Judgment normal.

## 2022-01-06 ENCOUNTER — Ambulatory Visit (INDEPENDENT_AMBULATORY_CARE_PROVIDER_SITE_OTHER): Payer: Medicare HMO | Admitting: Family Medicine

## 2022-01-06 ENCOUNTER — Telehealth: Payer: Self-pay | Admitting: Family Medicine

## 2022-01-06 ENCOUNTER — Encounter: Payer: Self-pay | Admitting: Family Medicine

## 2022-01-06 VITALS — BP 126/75 | HR 80 | Temp 97.3°F | Ht 61.0 in | Wt 99.0 lb

## 2022-01-06 DIAGNOSIS — Z79899 Other long term (current) drug therapy: Secondary | ICD-10-CM | POA: Diagnosis not present

## 2022-01-06 DIAGNOSIS — M5136 Other intervertebral disc degeneration, lumbar region: Secondary | ICD-10-CM

## 2022-01-06 DIAGNOSIS — M545 Low back pain, unspecified: Secondary | ICD-10-CM | POA: Diagnosis not present

## 2022-01-06 DIAGNOSIS — K5903 Drug induced constipation: Secondary | ICD-10-CM

## 2022-01-06 MED ORDER — BISACODYL 10 MG RE SUPP: 10.0000 mg | RECTAL | 2 refills | Status: DC | PRN

## 2022-01-06 MED ORDER — SENNA-DOCUSATE SODIUM 8.6-50 MG PO TABS: 1.0000 | ORAL_TABLET | Freq: Every day | ORAL | 2 refills | Status: DC

## 2022-01-06 MED ORDER — HYDROCODONE-ACETAMINOPHEN 5-325 MG PO TABS: 1.0000 | ORAL_TABLET | Freq: Three times a day (TID) | ORAL | 0 refills | Status: DC | PRN

## 2022-01-06 NOTE — Progress Notes (Signed)
? ?Assessment & Plan:  ?1-3. Degenerative disc disease, lumbar/Lumbar pain/Controlled substance agreement signed ?Improving.  Encouraged patient to take Norco every 8 hours for pain control. Controlled substance agreement in place.  Blood drug screen as expected. PDMP reviewed with no concerning findings.  ?- HYDROcodone-acetaminophen (NORCO) 5-325 MG tablet; Take 1 tablet by mouth every 8 (eight) hours as needed for moderate pain.  Dispense: 90 tablet; Refill: 0 ?- HYDROcodone-acetaminophen (NORCO) 5-325 MG tablet; Take 1 tablet by mouth every 8 (eight) hours as needed for moderate pain.  Dispense: 90 tablet; Refill: 0 ? ?4. Drug induced constipation ?Uncontrolled.  Patient to start taking Senokot-S nightly.  Discussed she needs to get her bowels moving for this to be effective.  Recommended a Dulcolax suppository at the same time as milk of magnesia.  Education provided on constipation. ?- sennosides-docusate sodium (SENOKOT-S) 8.6-50 MG tablet; Take 1 tablet by mouth at bedtime.  Dispense: 30 tablet; Refill: 2 ? ? ?Follow up plan: Return as scheduled. ? ?[name], MSN, APRN, FNP-C ?[place] ? ?Subjective:  ? ?Patient ID: ZAIRE VANBUSKIRK, female    DOB: [birth year], 81 y.o.   MRN: [id number] ? ?HPI: ?NAYSA PUSKAS is a 81 y.o. female presenting on 01/06/2022 for Pain (2 week follow up - DDD. Patient states it is no better ) and Constipation (Patient states that she has not had a good BM in a few weeks ) ? ?Patient is accompanied by her niece, who she is okay with being present.  ? ?Patient reports her back pain is unbearable. She was seen two weeks ago at which time she was switched to Norco as Tramadol 100 mg was not controlling her pain. She has a MRI lumbar spine scheduled for 01/12/2022. She reports today the pain is better with Norco but it doesn't last very long. She is taking one in the morning and one at night. She is also experiencing constipation. She typically has a bowel movement  every morning. She was prescribed Dulcolax at our last visit but has not been taking it because it made her belly hurt. She has been periodically taking Miralax and milk of magnesia, but nothing consistently.  ? ? ?ROS: Negative unless specifically indicated above in HPI.  ? ?Relevant past medical history reviewed and updated as indicated.  ? ?Allergies and medications reviewed and updated. ? ? ?Current Outpatient Medications:  ?  acetaminophen (TYLENOL) 650 MG CR tablet, Take 650 mg by mouth every 6 (six) hours as needed for pain. Displaced intertrochanteric fracture of right femur, Disp: , Rfl:  ?  amLODipine (NORVASC) 5 MG tablet, Take 1 tablet (5 mg total) by mouth daily., Disp: 90 tablet, Rfl: 3 ?  ASPIRIN LOW DOSE 81 MG EC tablet, TAKE 1 TABLET TWICE DAILY, Disp: 180 tablet, Rfl: 1 ?  bisacodyl (DULCOLAX) 5 MG EC tablet, Take 1 tablet (5 mg total) by mouth daily as needed for moderate constipation., Disp: 30 tablet, Rfl: 0 ?  diclofenac Sodium (VOLTAREN) 1 % GEL, Apply 2 g topically 4 (four) times daily., Disp: 50 g, Rfl: 0 ?  HYDROcodone-acetaminophen (NORCO) 5-325 MG tablet, Take 1 tablet by mouth every 8 (eight) hours as needed for moderate pain., Disp: 60 tablet, Rfl: 0 ?  metoprolol tartrate (LOPRESSOR) 50 MG tablet, Take 1.5 tablets (75 mg total) by mouth 2 (two) times daily., Disp: 270 tablet, Rfl: 1 ?  omeprazole (PRILOSEC) 20 MG capsule, TAKE 1 CAPSULE EVERY DAY, Disp: 90 capsule, Rfl: 1 ?  ondansetron (  ZOFRAN-ODT) 4 MG disintegrating tablet, Take 1 tablet (4 mg total) by mouth every 8 (eight) hours as needed for nausea or vomiting., Disp: 30 tablet, Rfl: 0 ?  simvastatin (ZOCOR) 20 MG tablet, TAKE 1 TABLET EVERY DAY, Disp: 90 tablet, Rfl: 0 ?  torsemide (DEMADEX) 20 MG tablet, Take 1 tablet (20 mg total) by mouth 2 (two) times daily., Disp: 180 tablet, Rfl: 1 ? ?Allergies  ?Allergen Reactions  ? Codeine Nausea And Vomiting  ? Doxycycline Other (See Comments)  ?  Chest congestion  ? Triamterene-Hctz  Other (See Comments)  ?  weakness  ? Atorvastatin Other (See Comments)  ?  Myalgias ?  ? Crestor [Rosuvastatin Calcium] Other (See Comments)  ?  weakness  ? Morphine Nausea Only  ? Risedronate Sodium Other (See Comments)  ?  ACTONEL - reflux  ? ? ?Objective:  ? ?BP 126/75   Pulse 80   Temp (!) 97.3 ?F (36.3 ?C) (Temporal)   Ht 5\' 1"  (1.549 m)   Wt 99 lb (44.9 kg)   SpO2 94%   BMI 18.71 kg/m?   ? ?Physical Exam ?Vitals reviewed.  ?Constitutional:   ?   General: She is not in acute distress. ?   Appearance: Normal appearance. She is underweight. She is not ill-appearing, toxic-appearing or diaphoretic.  ?HENT:  ?   Head: Normocephalic and atraumatic.  ?Eyes:  ?   General: No scleral icterus.    ?   Right eye: No discharge.     ?   Left eye: No discharge.  ?   Conjunctiva/sclera: Conjunctivae normal.  ?Cardiovascular:  ?   Rate and Rhythm: Normal rate and regular rhythm.  ?   Heart sounds: Normal heart sounds. No murmur heard. ?  No friction rub. No gallop.  ?Pulmonary:  ?   Effort: Pulmonary effort is normal. No respiratory distress.  ?   Breath sounds: Normal breath sounds. No stridor. No wheezing, rhonchi or rales.  ?Abdominal:  ?   General: Abdomen is flat. Bowel sounds are normal.  ?   Tenderness: There is no abdominal tenderness.  ?Musculoskeletal:     ?   General: Normal range of motion.  ?   Cervical back: Normal range of motion.  ?   Lumbar back: Tenderness and bony tenderness present.  ?Skin: ?   General: Skin is warm and dry.  ?   Capillary Refill: Capillary refill takes less than 2 seconds.  ?Neurological:  ?   General: No focal deficit present.  ?   Mental Status: She is alert and oriented to person, place, and time. Mental status is at baseline.  ?   Gait: Gait abnormal (walking with rollator).  ?Psychiatric:     ?   Mood and Affect: Mood normal.     ?   Behavior: Behavior normal.     ?   Thought Content: Thought content normal.     ?   Judgment: Judgment normal.  ? ? ? ? ? ? ?

## 2022-01-06 NOTE — Telephone Encounter (Signed)
Family aware  

## 2022-01-06 NOTE — Telephone Encounter (Signed)
Did not see suppository however did see Senokot is this correct or were you sending in the other as well? ?

## 2022-01-06 NOTE — Telephone Encounter (Signed)
I did not send it in because it is over-the-counter.  Since they have called, I have went ahead and sent a prescription.  I cannot promise insurance is going to cover it since it is over-the-counter. ?

## 2022-01-06 NOTE — Patient Instructions (Signed)
Dulcolax suppository at the same time as milk of magnesia.  ?

## 2022-01-12 ENCOUNTER — Ambulatory Visit (HOSPITAL_COMMUNITY)
Admission: RE | Admit: 2022-01-12 | Discharge: 2022-01-12 | Disposition: A | Discharge status: Home / Self Care | Payer: Medicare HMO | Source: Ambulatory Visit | Attending: Family Medicine | Admitting: Family Medicine

## 2022-01-12 ENCOUNTER — Telehealth: Payer: Self-pay

## 2022-01-12 ENCOUNTER — Other Ambulatory Visit: Payer: Self-pay

## 2022-01-12 ENCOUNTER — Other Ambulatory Visit: Payer: Self-pay | Admitting: Family Medicine

## 2022-01-12 DIAGNOSIS — M5136 Other intervertebral disc degeneration, lumbar region: Secondary | ICD-10-CM | POA: Insufficient documentation

## 2022-01-12 DIAGNOSIS — S32059A Unspecified fracture of fifth lumbar vertebra, initial encounter for closed fracture: Secondary | ICD-10-CM | POA: Diagnosis not present

## 2022-01-12 DIAGNOSIS — M545 Low back pain, unspecified: Secondary | ICD-10-CM | POA: Diagnosis not present

## 2022-01-12 DIAGNOSIS — M4856XA Collapsed vertebra, not elsewhere classified, lumbar region, initial encounter for fracture: Secondary | ICD-10-CM

## 2022-01-12 NOTE — Telephone Encounter (Signed)
Spoke with husband and she does not have a neurosurgeon or ortho. Would like referral placed  ?

## 2022-01-12 NOTE — Telephone Encounter (Signed)
Needs to see ortho or neurosurgery, please make sure pt has follow up. If not, please let me know so referral can be placed.  ?

## 2022-01-12 NOTE — Telephone Encounter (Signed)
Radiology calling with a call report on patient.  ? ?MR lumbar spine ? ?Wanted Impression 1 brought to providers attention. ? ?IMPRESSION: ?1. Compression deformities of the T12, L1, L3, and L5 vertebral ?bodies, likely acute to subacute in chronicity. The L1 compression ?fracture is most severe, with essentially complete loss of vertebral ?body height centrally. There was likely a mild compression fracture ?at this level on the radiographs from 11/10/2021, but this has ?significantly worsened in the interim. The other fractures are new ?since the prior radiograph. ? ?Will send to PCP and coverage. Coverage review and advise if can wait until PCP returns.  ?

## 2022-01-12 NOTE — Telephone Encounter (Signed)
Referral to neurosurgery placed, if worsening symptoms, needs to be reevaluated.  ?

## 2022-01-12 NOTE — Telephone Encounter (Signed)
Aware of referral

## 2022-01-19 DIAGNOSIS — S32010G Wedge compression fracture of first lumbar vertebra, subsequent encounter for fracture with delayed healing: Secondary | ICD-10-CM | POA: Diagnosis not present

## 2022-01-20 ENCOUNTER — Other Ambulatory Visit: Payer: Self-pay | Admitting: Cardiology

## 2022-01-20 DIAGNOSIS — E611 Iron deficiency: Secondary | ICD-10-CM | POA: Diagnosis not present

## 2022-01-20 DIAGNOSIS — E211 Secondary hyperparathyroidism, not elsewhere classified: Secondary | ICD-10-CM | POA: Diagnosis not present

## 2022-01-20 DIAGNOSIS — I480 Paroxysmal atrial fibrillation: Secondary | ICD-10-CM

## 2022-01-20 DIAGNOSIS — R7303 Prediabetes: Secondary | ICD-10-CM | POA: Diagnosis not present

## 2022-01-20 DIAGNOSIS — R809 Proteinuria, unspecified: Secondary | ICD-10-CM | POA: Diagnosis not present

## 2022-01-20 DIAGNOSIS — I5032 Chronic diastolic (congestive) heart failure: Secondary | ICD-10-CM | POA: Diagnosis not present

## 2022-01-20 DIAGNOSIS — D472 Monoclonal gammopathy: Secondary | ICD-10-CM | POA: Diagnosis not present

## 2022-01-20 DIAGNOSIS — R778 Other specified abnormalities of plasma proteins: Secondary | ICD-10-CM | POA: Diagnosis not present

## 2022-01-20 DIAGNOSIS — I129 Hypertensive chronic kidney disease with stage 1 through stage 4 chronic kidney disease, or unspecified chronic kidney disease: Secondary | ICD-10-CM | POA: Diagnosis not present

## 2022-01-20 DIAGNOSIS — N1831 Chronic kidney disease, stage 3a: Secondary | ICD-10-CM | POA: Diagnosis not present

## 2022-02-06 NOTE — Progress Notes (Signed)
? ? ?[address] ?[place], [place] [address] ? ? ?CLINIC:  ?Medical Oncology/Hematology ? ?CONSULT NOTE ? ?Patient Care Team: ?Loman Brooklyn, FNP as PCP - General (Family Medicine) ?Minus Breeding, MD as PCP - Cardiology (Cardiology) ?Fields, Marga Melnick, MD (Inactive) as Attending Physician (Gastroenterology) ?Melina Schools, OD as Consulting Physician (Optometry) ?Sherran Needs, NP as Consulting Physician (Cardiology) ? 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 ?HISTORY OF PRESENTING ILLNESS:  ?Norma Barajas 81 y.o. female is here at the request of her nephrologist, Dr. Theador Hawthorne, for evaluation of possible plasma cell dyscrasia. ? ?[name] was recently referred to Dr. Theador Hawthorne (nephrology) for work-up of chronic kidney disease.  During work-up (labs from 12/22/2021), she was found to have SPEP with poorly defined band of restricted protein mobility in the gammaglobulin region.  Serum immunofixation was negative for monoclonal protein.  She was also found to have free light chain ratio mildly elevated at 2.06, which is difficult to interpret free light chain ratios between 1.65 and 3.0 in the setting of renal insufficiency.  24-hour urine/UPEP/urine IFE did not show any monoclonal protein.  Creatinine was 1.23, calcium 9.1, hemoglobin 15.4.  She was sent to hematology for further evaluation. ? ?She denies any new bone pain or recent fractures, but does have some chronic back pain. ?She denies any B symptoms such as fever, chills, night sweats. ?She does have some poor appetite and related weight loss, which she thinks may be related to underlying depression.  Her only son died after MVA 3 years ago, and her husband is in poor health, which is causing her significant distress. ?She has occasional tinnitus.  No new-onset hearing loss, blurred vision, headache, or dizziness.  Denies any numbness or tingling in hands or feet. ?No history of DVT,  PE, MI, or CVA. ?No new masses or lymphadenopathy per her report. ? ?She reports 25 to 50% appetite and 50% energy. ? ? ?MEDICAL HISTORY:  ?Past Medical History:  ?Diagnosis Date  ? 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 Right ear   ? Anxiety   ? Arthritis   ? "right leg" (04/11/2015)  ? BPPV (benign paroxysmal positional vertigo) 03/23/2016  ? Cataract   ? Coronary atherosclerosis of native coronary artery   ? a. Nonobstructive minimal CAD 10/2005.  ? Depression   ? Diastolic dysfunction   ? Grade 1. Ejection fraction 60-65%.  ? Dysrhythmia   ? a fib  ? Erosive esophagitis   ? Essential hypertension   ? Fracture of ramus of right pubis with routine healing 12/09/2020  ? GERD (gastroesophageal reflux disease)   ? Grade III hemorrhoids   ? History of hiatal hernia   ? Hypercholesterolemia   ? Internal hemorrhoids with complication 05/17/6268  ? OCT 2015 FLEX SIG/IH BANDING    ? Intertrochanteric fracture of femur ([place])   ? 5/16 - 03/26/2021 fracture sustained in a mechanical fall.  IM nailing performed 5/17 by Dr Larena Glassman Warfarin held because of posttraumatic pelvic hematoma.  Postop DVT with 81 mg aspirin twice daily x28 days.  ? Major depressive disorder, recurrent, moderate ([place]) 12/09/2020  ? Migraine   ? "used to have them right bad; I don't now" (04/11/2015)  ? MITRAL REGURGITATION 04/27/2010  ? Qualifier: Diagnosis of  By: [name], MD, [name]   ? Osteoporosis   ? Paroxysmal atrial fibrillation (H[facility])   ? Pelvic hematoma, female 03/27/2021  ? Sustained in mechanical fall.  Warfarin held.  IM nailing performed 5/17 with postop DVT prophylaxis 81 mg twice daily x28 days.  ? PONV (postoperative nausea and vomiting)   ? Vertigo   ? ? ?SURGICAL HISTORY: ?Past Surgical History:  ?Procedure Laterality Date  ? BRAVO [place] STUDY  11/15/2012  ? Procedure: BRAVO [place];  Surgeon: [name], MD;  [facility];  Service: Endoscopy;;  ? CARDIOVERSION N/A 05/16/2020  ? Procedure: CARDIOVERSION;  Surgeon: Donato Heinz, MD;  Location: [facility] ENDOSCOPY;  Service: Cardiovascular;  Laterality: N/A;  ? CATARACT EXTRACTION W/ INTRAOCULAR LENS  IMPLANT, BILATERAL Bilateral   ? COLONOSCOPY  2008  ? Dr. [name]: internal hemorrhoids   ? DILATION AND CURETTAGE OF UTERUS    ? ESOPHAGEAL DILATION N/A 06/16/2021  ? Procedure: ESOPHAGEAL DILATION;  Surgeon: Montez Morita, Quillian Quince, MD;  [facility]: [facility];  Service: Gastroenterology;  Laterality: N/A;  ? ESOPHAGOGASTRODUODENOSCOPY (EGD) WITH ESOPHAGEAL DILATION  2001  ? Dr. [name]: erosive esophagitis, esophageal stricture, duodenitis, s/p Savary dilation  ? ESOPHAGOGASTRODUODENOSCOPY (EGD) WITH ESOPHAGEAL DILATION  11/15/2012  ? ASN:[id number] web was found & MOST LIKELY CAUSE FOR DYAPHAGIA/Polyp was found in the gastric body and gastric fundus/ gastritis on bx  ? ESOPHAGOGASTRODUODENOSCOPY (EGD) WITH PROPOFOL N/A 06/16/2021  ? Procedure: ESOPHAGOGASTRODUODENOSCOPY (EGD) WITH PROPOFOL;  Surgeon: Harvel Quale, MD;  [facility];  Service: Gastroenterology;  Laterality: N/A;  8:15  ? EYE SURGERY Bilateral   ? "laser OR after cataract OR; cause I couldn't see"  ? FLEXIBLE SIGMOIDOSCOPY N/A 08/22/2014  ? mild diverticulosis in sigmoid, moderate sized Grade 3 hemorrhoids s/p banding X 3.   ? FRACTURE SURGERY Right   ? below the knee - 2 bones broke has plates  ? HEMORRHOID BANDING N/A 08/22/2014  ? Procedure: HEMORRHOID BANDING;  Surgeon: [name], MD;  [facility];  Service: Endoscopy;  Laterality: N/A;  ? HEMORRHOID SURGERY N/A 03/30/2018  ? Procedure: EXTENSIVE HEMORRHOIDECTOMY;  Surgeon: [name], MD;  Location: [facility] ORS;  Service: General;  Laterality: N/A;  ? INTRAMEDULLARY (IM) NAIL INTERTROCHANTERIC Right 03/24/2021  ? Procedure: INTRAMEDULLARY (IM) NAIL INTERTROCHANTRIC;  Surgeon: Mordecai Rasmussen, MD;  Location: [facility] OR[facility];  Service: Orthopedics;  Laterality: Right;  ? OPEN REDUCTION INTERNAL FIXATION (ORIF) TIBIA/FIBULA FRACTURE Right  2013  ? broke tibia and fibula after falling down stairs  ? PROLAPSED UTERINE FIBROID LIGATION  2015  ? TOTAL ABDOMINAL HYSTERECTOMY    ? ? ?SOCIAL HISTORY: ?Social History  ? ?Socioeconomic History  ? Marital status: Married  ?  Spouse name: george   ? Number of children: 1  ? Years of education: Not on file  ? Highest education level: Not on file  ?Occupational History  ? 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 Smoking status: Never  ? Smokeless tobacco: Never  ?Vaping Use  ? Vaping Use: Never used  ?Substance and Sexual Activity  ? Alcohol use: No  ?  Alcohol/week: 0.0 standard drinks  ? Drug use: No  ? Sexual activity: Not Currently  ?  Birth control/protection: Surgical, Post-menopausal  ?  Comment: hyst  ?Other Topics Concern  ? Not on file  ?Social History Narrative  ? Married  ? No regular exercise  ? ?Social Determinants of Health  ? ?Financial Resource Strain: Not on file  ?Food Insecurity: Not on file  ?Transportation Needs: Not on file  ?Physical Activity: Not on file  ?Stress: Not on file  ?  Social Connections: Not on file  ?Intimate Partner Violence: Not on file  ? ? ?FAMILY HISTORY: ?Family History  ?Problem Relation Age of Onset  ? Colon cancer Mother 76  ? Heart disease Mother   ? Osteoporosis Mother   ? Hip fracture Mother   ? Stroke Sister   ? Diabetes Sister   ? Osteoporosis Sister   ? Arthritis Sister   ? Uterine cancer Sister   ? Stroke Sister   ? Heart disease Father   ? Hyperlipidemia Brother   ? Hypertension Brother   ? Heart disease Brother   ? Stroke Brother   ? Heart disease Sister   ? Dementia Sister   ? Diabetes Son   ? Stroke Son   ? Heart attack Neg Hx   ? ? ?ALLERGIES:  is allergic to codeine, doxycycline, triamterene-hctz, atorvastatin, crestor [rosuvastatin calcium], morphine, and risedronate sodium. ? ?MEDICATIONS:  ?Current Outpatient Medications  ?Medication Sig Dispense Refill  ? acetaminophen (TYLENOL) 650 MG CR tablet Take 650 mg by mouth every 6 (six) hours  as needed for pain. Displaced intertrochanteric fracture of right femur    ? amLODipine (NORVASC) 5 MG tablet Take 1 tablet (5 mg total) by mouth daily. 90 tablet 3  ? ASPIRIN LOW DOSE 81 MG EC tablet T

## 2022-02-07 DIAGNOSIS — R778 Other specified abnormalities of plasma proteins: Secondary | ICD-10-CM | POA: Insufficient documentation

## 2022-02-08 ENCOUNTER — Inpatient Hospital Stay (HOSPITAL_COMMUNITY): Payer: Medicare HMO

## 2022-02-08 ENCOUNTER — Inpatient Hospital Stay (HOSPITAL_COMMUNITY): Payer: Medicare HMO | Attending: Hematology | Admitting: Hematology

## 2022-02-08 VITALS — BP 100/68 | HR 81 | Temp 97.4°F | Resp 20 | Ht 61.0 in | Wt 96.3 lb

## 2022-02-08 DIAGNOSIS — R630 Anorexia: Secondary | ICD-10-CM | POA: Insufficient documentation

## 2022-02-08 DIAGNOSIS — I129 Hypertensive chronic kidney disease with stage 1 through stage 4 chronic kidney disease, or unspecified chronic kidney disease: Secondary | ICD-10-CM | POA: Insufficient documentation

## 2022-02-08 DIAGNOSIS — R778 Other specified abnormalities of plasma proteins: Secondary | ICD-10-CM | POA: Insufficient documentation

## 2022-02-08 DIAGNOSIS — Z7982 Long term (current) use of aspirin: Secondary | ICD-10-CM | POA: Insufficient documentation

## 2022-02-08 DIAGNOSIS — I4891 Unspecified atrial fibrillation: Secondary | ICD-10-CM | POA: Diagnosis not present

## 2022-02-08 DIAGNOSIS — N1831 Chronic kidney disease, stage 3a: Secondary | ICD-10-CM | POA: Insufficient documentation

## 2022-02-08 DIAGNOSIS — I2510 Atherosclerotic heart disease of native coronary artery without angina pectoris: Secondary | ICD-10-CM | POA: Diagnosis not present

## 2022-02-08 DIAGNOSIS — Z800 Family history of malignant neoplasm of digestive organs: Secondary | ICD-10-CM | POA: Diagnosis not present

## 2022-02-08 DIAGNOSIS — R634 Abnormal weight loss: Secondary | ICD-10-CM | POA: Diagnosis not present

## 2022-02-08 LAB — LACTATE DEHYDROGENASE: LDH: 168 U/L (ref 98–192)

## 2022-02-08 NOTE — Patient Instructions (Signed)
[place] at [facility] ?Discharge Instructions ? ?You were seen today by Dr. Delton Coombes and Tarri Abernethy PA-C.  You were sent to see [place] by your kidney doctor because of some mildly abnormal lab results.  These lab results showed possible presence of an abnormal protein that can be precancerous in some patients.  We will check these labs again today to see if this protein is still positive.  If it is negative, then we will check labs again in 6 months and see you for follow-up visit at that time. ? ?LABS: Labs TODAY plus repeat labs in 6 months ? ?FOLLOW-UP APPOINTMENT: Office visit in 6 months, 1 week after labs ? ? ?Thank you for choosing [place] at [facility] to provide your oncology and hematology care.  To afford each patient quality time with our provider, please arrive at least 15 minutes before your scheduled appointment time.  ? ?If you have a lab appointment with the [facility] please come in thru the Main Entrance and check in at the main information desk. ? ?You need to re-schedule your appointment should you arrive 10 or more minutes late.  We strive to give you quality time with our providers, and arriving late affects you and other patients whose appointments are after yours.  Also, if you no show three or more times for appointments you may be dismissed from the clinic at the providers discretion.     ?Again, thank you for choosing [facility].  Our hope is that these requests will decrease the amount of time that you wait before being seen by our physicians.       ?_____________________________________________________________ ? ?Should you have questions after your visit to [facility], please contact our office at [phone] and follow the prompts.  Our office hours are 8:00 a.m. and 4:30 p.m. Monday - Friday.  Please note that voicemails left after 4:00 p.m. may not be returned until the following business  day.  We are closed weekends and major holidays.  You do have access to a nurse 24-7, just call the main number to the clinic [phone] and do not press any options, hold on the line and a nurse will answer the phone.   ? ?For prescription refill requests, have your pharmacy contact our office and allow 72 hours.   ? ?Due to Covid, you will need to wear a mask upon entering the hospital. If you do not have a mask, a mask will be given to you at the Main Entrance upon arrival. For doctor visits, patients may have 1 support person age 93 or older with them. For treatment visits, patients can not have anyone with them due to social distancing guidelines and our immunocompromised population.  ? ? ? ?

## 2022-02-09 LAB — BETA 2 MICROGLOBULIN, SERUM: Beta-2 Microglobulin: 3.5 mg/L — ABNORMAL HIGH (ref 0.6–2.4)

## 2022-02-10 ENCOUNTER — Telehealth: Payer: Self-pay | Admitting: Family Medicine

## 2022-02-10 LAB — PROTEIN ELECTROPHORESIS, SERUM
A/G Ratio: 1.1 (ref 0.7–1.7)
Albumin ELP: 3.7 g/dL (ref 2.9–4.4)
Alpha-1-Globulin: 0.3 g/dL (ref 0.0–0.4)
Alpha-2-Globulin: 0.9 g/dL (ref 0.4–1.0)
Beta Globulin: 1 g/dL (ref 0.7–1.3)
Gamma Globulin: 1 g/dL (ref 0.4–1.8)
Globulin, Total: 3.3 g/dL (ref 2.2–3.9)
Total Protein ELP: 7 g/dL (ref 6.0–8.5)

## 2022-02-10 NOTE — Telephone Encounter (Signed)
Would they qualify for [facility]? I know they will need a [id number] so  ?

## 2022-02-10 NOTE — Telephone Encounter (Signed)
Yes I can order home health if they have a face-to-face visit. ?

## 2022-02-11 ENCOUNTER — Ambulatory Visit: Payer: Medicare HMO | Admitting: Family Medicine

## 2022-02-11 ENCOUNTER — Telehealth: Payer: Self-pay | Admitting: Family Medicine

## 2022-02-11 NOTE — Telephone Encounter (Signed)
Pt aware to call office when she is able to schedule an appointment ?

## 2022-02-11 NOTE — Telephone Encounter (Signed)
TC made yesterday to pt explaining that [facility] can order [facility] to get her help, will need to make a F2F appt for this. ?

## 2022-02-18 ENCOUNTER — Other Ambulatory Visit: Payer: Self-pay | Admitting: Family Medicine

## 2022-02-18 ENCOUNTER — Encounter: Payer: Medicare HMO | Admitting: Family Medicine

## 2022-02-18 ENCOUNTER — Encounter: Payer: Self-pay | Admitting: Family Medicine

## 2022-02-18 NOTE — Progress Notes (Signed)
Phone # provided and in chart does not ring. Please re-schedule.  ?

## 2022-02-19 ENCOUNTER — Telehealth: Payer: Self-pay | Admitting: Family Medicine

## 2022-02-19 NOTE — Telephone Encounter (Signed)
Attempted to contact- line busy x2 ? ?When calls back - set up [place] visit - 30 mins  ?

## 2022-02-22 DIAGNOSIS — S32010G Wedge compression fracture of first lumbar vertebra, subsequent encounter for fracture with delayed healing: Secondary | ICD-10-CM | POA: Diagnosis not present

## 2022-02-22 DIAGNOSIS — Z681 Body mass index (BMI) 19 or less, adult: Secondary | ICD-10-CM | POA: Diagnosis not present

## 2022-02-22 NOTE — Telephone Encounter (Signed)
Patient aware and will call back to schedule

## 2022-03-17 ENCOUNTER — Other Ambulatory Visit: Payer: Self-pay | Admitting: Family Medicine

## 2022-03-17 DIAGNOSIS — M5136 Other intervertebral disc degeneration, lumbar region: Secondary | ICD-10-CM

## 2022-03-22 ENCOUNTER — Other Ambulatory Visit: Payer: Self-pay | Admitting: Family Medicine

## 2022-03-23 ENCOUNTER — Ambulatory Visit: Payer: Medicare HMO | Admitting: Orthopedic Surgery

## 2022-03-29 ENCOUNTER — Other Ambulatory Visit: Payer: Self-pay | Admitting: Family Medicine

## 2022-03-29 DIAGNOSIS — I5032 Chronic diastolic (congestive) heart failure: Secondary | ICD-10-CM

## 2022-04-13 ENCOUNTER — Encounter: Payer: Self-pay | Admitting: Family Medicine

## 2022-04-13 ENCOUNTER — Ambulatory Visit (INDEPENDENT_AMBULATORY_CARE_PROVIDER_SITE_OTHER): Payer: Medicare HMO | Admitting: Family Medicine

## 2022-04-13 VITALS — BP 111/74 | HR 86 | Temp 97.9°F | Ht 61.0 in | Wt 95.2 lb

## 2022-04-13 DIAGNOSIS — E782 Mixed hyperlipidemia: Secondary | ICD-10-CM

## 2022-04-13 DIAGNOSIS — M545 Low back pain, unspecified: Secondary | ICD-10-CM

## 2022-04-13 DIAGNOSIS — I110 Hypertensive heart disease with heart failure: Secondary | ICD-10-CM | POA: Diagnosis not present

## 2022-04-13 DIAGNOSIS — I5032 Chronic diastolic (congestive) heart failure: Secondary | ICD-10-CM

## 2022-04-13 DIAGNOSIS — M8000XD Age-related osteoporosis with current pathological fracture, unspecified site, subsequent encounter for fracture with routine healing: Secondary | ICD-10-CM

## 2022-04-13 DIAGNOSIS — R140 Abdominal distension (gaseous): Secondary | ICD-10-CM | POA: Diagnosis not present

## 2022-04-13 DIAGNOSIS — Z5982 Transportation insecurity: Secondary | ICD-10-CM

## 2022-04-13 DIAGNOSIS — R7303 Prediabetes: Secondary | ICD-10-CM

## 2022-04-13 DIAGNOSIS — R937 Abnormal findings on diagnostic imaging of other parts of musculoskeletal system: Secondary | ICD-10-CM

## 2022-04-13 DIAGNOSIS — M5136 Other intervertebral disc degeneration, lumbar region: Secondary | ICD-10-CM

## 2022-04-13 DIAGNOSIS — K219 Gastro-esophageal reflux disease without esophagitis: Secondary | ICD-10-CM

## 2022-04-13 DIAGNOSIS — Z79899 Other long term (current) drug therapy: Secondary | ICD-10-CM

## 2022-04-13 DIAGNOSIS — E876 Hypokalemia: Secondary | ICD-10-CM

## 2022-04-13 DIAGNOSIS — I2510 Atherosclerotic heart disease of native coronary artery without angina pectoris: Secondary | ICD-10-CM

## 2022-04-13 DIAGNOSIS — K5903 Drug induced constipation: Secondary | ICD-10-CM | POA: Diagnosis not present

## 2022-04-13 DIAGNOSIS — I480 Paroxysmal atrial fibrillation: Secondary | ICD-10-CM

## 2022-04-13 DIAGNOSIS — I700 Atherosclerosis of aorta: Secondary | ICD-10-CM

## 2022-04-13 LAB — BAYER DCA HB A1C WAIVED: HB A1C (BAYER DCA - WAIVED): 5.4 % (ref 4.8–5.6)

## 2022-04-13 MED ORDER — SENNA-DOCUSATE SODIUM 8.6-50 MG PO TABS: 1.0000 | ORAL_TABLET | Freq: Every day | ORAL | 3 refills | Status: DC

## 2022-04-13 MED ORDER — HYDROCODONE-ACETAMINOPHEN 5-325 MG PO TABS: 1.0000 | ORAL_TABLET | Freq: Three times a day (TID) | ORAL | 0 refills | Status: DC | PRN

## 2022-04-13 MED ORDER — OMEPRAZOLE 20 MG PO CPDR: 20.0000 mg | DELAYED_RELEASE_CAPSULE | Freq: Every day | ORAL | 3 refills | Status: DC

## 2022-04-13 NOTE — Progress Notes (Signed)
Assessment & Plan:  1-3. Degenerative disc disease, lumbar/Lumbar pain/Controlled substance agreement signed Well controlled on current regimen. Controlled substance agreement in place. Drug screen as expected. PDMP reviewed with no concerning findings.  - HYDROcodone-acetaminophen (NORCO) 5-325 MG tablet; Take 1 tablet by mouth every 8 (eight) hours as needed for moderate pain.  Dispense: 90 tablet; Refill: 0 - HYDROcodone-acetaminophen (NORCO) 5-325 MG tablet; Take 1 tablet by mouth every 8 (eight) hours as needed for moderate pain.  Dispense: 90 tablet; Refill: 0 - HYDROcodone-acetaminophen (NORCO) 5-325 MG tablet; Take 1 tablet by mouth every 8 (eight) hours as needed for moderate pain.  Dispense: 90 tablet; Refill: 0 - CBC with Differential/Platelet - CMP14+EGFR  4. Abnormal MRI, lumbar spine Patient declined repeat imaging due to transportation issues.  5. Drug induced constipation Well controlled on current regimen.  - sennosides-docusate sodium (SENOKOT-S) 8.6-50 MG tablet; Take 1 tablet by mouth at bedtime.  Dispense: 90 tablet; Refill: 3 - CMP14+EGFR  6. Abdominal bloating Patient declined additional imaging or seeing gastroenterology due to transportation issues. - CBC with Differential/Platelet - CMP14+EGFR  7. Paroxysmal atrial fibrillation (HCC) Continue current regimen. - CMP14+EGFR  8. Hypertensive heart disease with chronic diastolic congestive heart failure ([place]) Well controlled on current regimen.  - CBC with Differential/Platelet - CMP14+EGFR - Magnesium - Lipid panel - TSH  9. Hypokalemia - CMP14+EGFR  10. Aortic atherosclerosis (HCC) Continue current regimen. - CMP14+EGFR - Lipid panel  11. Atherosclerosis of native coronary artery of native heart without angina pectoris Continue current regimen. - CMP14+EGFR - Lipid panel  12. Mixed hyperlipidemia Well controlled on current regimen.  - CMP14+EGFR - Lipid panel  13. Age-related  osteoporosis with current pathological fracture with routine healing, subsequent encounter On agreeable to medication to treat. - VITAMIN D 25 Hydroxy (Vit-D Deficiency, Fractures)  14. Prediabetes - Bayer DCA Hb A1c Waived  15. Gastroesophageal reflux disease without esophagitis Well controlled on current regimen.  - omeprazole (PRILOSEC) 20 MG capsule; Take 1 capsule (20 mg total) by mouth daily.  Dispense: 90 capsule; Refill: 3 - CMP14+EGFR  16. Lack of access to transportation Unagreeable to a referral to chronic care management to assist in transportation.  She is the sole caregiver of her husband with dementia and feels like she needs to be there for him at all times.  She understands they are going to need an assisted living facility in the near future, but she is not ready yet.   Return in about 3 months (around 07/14/2022) for follow-up of chronic medication conditions.  [name], MSN, APRN, FNP-C [facility]  Subjective:    Patient ID: BRYANNE RIQUELME, female    DOB: 1941-08-28, 81 y.o.   MRN: [id number]  Patient Care Team: Loman Brooklyn, FNP as PCP - General (Family Medicine) Minus Breeding, MD as PCP - Cardiology (Cardiology) [name], MD (Inactive) as Attending Physician (Gastroenterology) Melina Schools, OD as Consulting Physician (Optometry) Sherran Needs, NP as Consulting Physician (Cardiology)   Chief Complaint:  Chief Complaint  Patient presents with   Medical Management of Chronic Issues   Bloated    Abdominal swelling - ongoing     HPI: YENESIS EVEN is a 81 y.o. female presenting on 04/13/2022 for Medical Management of Chronic Issues and Bloated (Abdominal swelling - ongoing )  Patient is accompanied by her husband who she is okay with being present.  Pain assessment: Cause of pain- lumbar degenerative disc disease Lumbar spine x-ray 11/10/2021:  Anterolisthesis of L3 on L4 and L4 on L5.  MRI Lumbar spine 01/12/2022:  1.  Compression deformities of the T12, L1, L3, and L5 vertebral bodies, likely acute to subacute in chronicity. The L1 compression fracture is most severe, with essentially complete loss of vertebral body height centrally. There was likely a mild compression fracture at this level on the radiographs from 11/10/2021, but this has significantly worsened in the interim. The other fractures are new since the prior radiograph. 2. Mild bony retropulsion at L1 contributes to mild spinal canal stenosis but no cord compression. There is minimal bony retropulsion at the other levels. 3. These fractures are most likely benign osteoporotic compression fractures. Follow up lumbar spine MRI with contrast could be considered in ~2 months to exclude underlying lesions. 4. Multilevel degenerative changes as detailed above resulting in up to mild-to-moderate spinal canal stenosis at T11-T12, moderate spinal canal stenosis with crowding of the subarticular zones at L3-L4, right worse than left subarticular zone narrowing with possible impingement of the traversing right L5 nerve root and moderate left neural foraminal stenosis at L4-L5. 5. Remote appearing right sacral insufficiency fracture.  Pain location-low Pain on scale of 1-10- 10/10 without medication Frequency- daily What increases pain- coughing, sneezing, movement What makes pain better- rest, heating pad, Norco, Tylenol, Voltaren gel Effects on ADL - impairs ability to perform ADLs Any change in general medical condition- no.  Current opioids rx- Norco 5/325 Q8H PRN # meds rx- 90 Effectiveness of current meds- helpful Adverse reactions from pain meds- none Morphine equivalent- 15 MME/day She has been seen by a neurosurgeon and reports she was told they could not do surgery because she does not have enough "good bones".  They gave her a back brace, which she does not find helpful.  Pill count performed - No Last drug screen - 12/16/2021 ( high risk q61m,  moderate risk q60m, low risk yearly ) Urine drug screen today- No Was the [place] reviewed- Yes If yes were their any concerning findings? - No  Overdose risk: 260     12/16/2021    4:43 PM  Opioid Risk   Alcohol 3  Illegal Drugs 3  Rx Drugs 0  Alcohol 0  Illegal Drugs 0  Rx Drugs 0  Age between 16-45 years  0  History of Preadolescent Sexual Abuse 0  Psychological Disease 0  Depression 0  Opioid Risk Tool Scoring 6  Opioid Risk Interpretation Moderate Risk   Pain contract signed on: 12/16/2021   Constipation: Taking Senokot-S nightly.  States she is having good bowel movements.  She does continue to report feeling bloated.  She was previously following with gastroenterology for her abdominal complaints, but has not been as she has been taking care of her husband with dementia and his own health issues.  She denies abdominal pain.  A-Fib: patient previously reports she could feel herself in A-Fib and racing all the time. She has had a cardioversion in the past which only briefly converted. She does not wish to do this again. She has taken amiodarone in the past and did not like how it made her feel. She could not afford Tikosyn. Previously treated with Satolol which she failed. Metoprolol controls her symptoms. Coumadin was discontinued as her risks outweigh her benefits due to recurrent falls. She does still take aspirin 81 mg twice daily.   CHF/Edema: She is taking torsemide as prescribed 20 mg BID. States she tried to stop taking it but it caused chest discomfort  so she resumed it and symptoms resolved. She is not taking potassium. She states she eats foods very rich in potassium to offset not taking it. She was previously prescribed an appetite stimulant which she did not take; she declines another one at this time. She does not wear compression hose.  Osteoporosis: Not taking or agreeable to calcium, vitamin D, or any medication to treat.  Atherosclerosis: Taking aspirin and  simvastatin.  GERD: Symptoms controlled with omeprazole.  CKD: Patient was previously referred to nephrology who she last saw on 01/20/2022.  After their work-up she was referred to hematology who she saw on 02/08/2022.  Patient reports she will not be returning to either due to transportation issues.  New complaints: None   Social history:  Relevant past medical, surgical, family and social history reviewed and updated as indicated. Interim medical history since our last visit reviewed.  Allergies and medications reviewed and updated.  DATA REVIEWED: CHART IN EPIC  ROS: Negative unless specifically indicated above in HPI.    Current Outpatient Medications:    acetaminophen (TYLENOL) 650 MG CR tablet, Take 650 mg by mouth every 6 (six) hours as needed for pain. Displaced intertrochanteric fracture of right femur, Disp: , Rfl:    amLODipine (NORVASC) 5 MG tablet, Take 1 tablet (5 mg total) by mouth daily., Disp: 90 tablet, Rfl: 3   ASPIRIN LOW DOSE 81 MG EC tablet, TAKE 1 TABLET TWICE DAILY, Disp: 180 tablet, Rfl: 1   bisacodyl (DULCOLAX) 10 MG suppository, Place 1 suppository (10 mg total) rectally as needed for moderate constipation., Disp: 4 suppository, Rfl: 2   diclofenac Sodium (VOLTAREN) 1 % GEL, Apply 2 g topically 4 (four) times daily., Disp: 50 g, Rfl: 0   ferrous sulfate 325 (65 FE) MG EC tablet, Take 1 tablet by mouth daily., Disp: , Rfl:    metoprolol tartrate (LOPRESSOR) 50 MG tablet, TAKE 1 TABLET TWICE DAILY, Disp: 180 tablet, Rfl: 2   omeprazole (PRILOSEC) 20 MG capsule, TAKE 1 CAPSULE EVERY DAY, Disp: 90 capsule, Rfl: 1   ondansetron (ZOFRAN-ODT) 4 MG disintegrating tablet, Take 1 tablet (4 mg total) by mouth every 8 (eight) hours as needed for nausea or vomiting., Disp: 30 tablet, Rfl: 0   sennosides-docusate sodium (SENOKOT-S) 8.6-50 MG tablet, Take 1 tablet by mouth at bedtime., Disp: 30 tablet, Rfl: 2   simvastatin (ZOCOR) 20 MG tablet, TAKE 1 TABLET EVERY DAY, Disp:  90 tablet, Rfl: 0   torsemide (DEMADEX) 20 MG tablet, TAKE 1 TABLET TWICE DAILY, Disp: 180 tablet, Rfl: 0   HYDROcodone-acetaminophen (NORCO) 5-325 MG tablet, Take 1 tablet by mouth every 8 (eight) hours as needed for moderate pain. (Patient not taking: Reported on 04/13/2022), Disp: 90 tablet, Rfl: 0   HYDROcodone-acetaminophen (NORCO) 5-325 MG tablet, Take 1 tablet by mouth every 8 (eight) hours as needed for moderate pain. 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 Nonobstructive minimal CAD 10/2005.   Depression    Diastolic dysfunction    Grade 1. Ejection fraction 60-65%.   Dysrhythmia    a fib   Erosive esophagitis    Essential hypertension    Fracture of ramus of right pubis with routine healing 12/09/2020   GERD (gastroesophageal reflux disease)    Grade III hemorrhoids    History of hiatal hernia    Hypercholesterolemia    Internal hemorrhoids with complication 0/01/7047   OCT 2015 FLEX SIG/IH BANDING     Intertrochanteric fracture of femur ([place])    5/16 - 03/26/2021 fracture sustained in a mechanical fall.  IM nailing performed 5/17 by Dr Larena Glassman Warfarin held because of posttraumatic pelvic hematoma.  Postop DVT with 81 mg aspirin twice daily x28 days.   Major depressive disorder, recurrent, moderate ([place])  12/09/2020   Migraine    "used to have them right bad; I don't now" (04/11/2015)   MITRAL REGURGITATION 04/27/2010   Qualifier: Diagnosis of  By: [name], MD, [name]    Osteoporosis    Paroxysmal atrial fibrillation [facility])    Pelvic hematoma, female 03/27/2021   Sustained in mechanical fall.  Warfarin held.  IM nailing performed 5/17 with postop DVT prophylaxis 81 mg twice daily x28 days.   PONV (postoperative nausea and vomiting)    Vertigo     Past Surgical History:  Procedure Laterality Date   BRAVO [facility] STUDY  11/15/2012   Procedure: BRAVO [place];  Surgeon: [name], MD;  [facility];  Service: Endoscopy;;   CARDIOVERSION N/A 05/16/2020   Procedure: CARDIOVERSION;  Surgeon: Donato Heinz, MD;  Location: [place];  Service: Cardiovascular;  Laterality: N/A;   CATARACT EXTRACTION W/ INTRAOCULAR LENS  IMPLANT, BILATERAL Bilateral    COLONOSCOPY  2008   Dr. [name]: internal hemorrhoids    DILATION AND CURETTAGE OF UTERUS     ESOPHAGEAL DILATION N/A 06/16/2021   Procedure: ESOPHAGEAL DILATION;  Surgeon: Harvel Quale, MD;  [facility];  Service: Gastroenterology;  Laterality: N/A;   ESOPHAGOGASTRODUODENOSCOPY (EGD) WITH ESOPHAGEAL DILATION  2001   Dr. [name]: erosive esophagitis, esophageal stricture, duodenitis, s/p Savary dilation   ESOPHAGOGASTRODUODENOSCOPY (EGD) WITH ESOPHAGEAL DILATION  11/15/2012   GQB:[id number] web was found & MOST LIKELY CAUSE FOR DYAPHAGIA/Polyp was found in the gastric body and gastric fundus/ gastritis on bx   ESOPHAGOGASTRODUODENOSCOPY (EGD) WITH PROPOFOL N/A 06/16/2021   Procedure: ESOPHAGOGASTRODUODENOSCOPY (EGD) WITH PROPOFOL;  Surgeon: Harvel Quale, MD;  [facility];  Service: Gastroenterology;  Laterality: N/A;  8:15   EYE SURGERY Bilateral    "laser OR after cataract OR; cause I couldn't see"   FLEXIBLE SIGMOIDOSCOPY N/A 08/22/2014   mild diverticulosis in sigmoid, moderate  sized Grade 3 hemorrhoids s/p banding X 3.    FRACTURE SURGERY Right    below the knee - 2 bones broke has plates   HEMORRHOID BANDING N/A 08/22/2014   Procedure: HEMORRHOID BANDING;  Surgeon: [name], MD;  [facility];  Service: Endoscopy;  Laterality: N/A;   HEMORRHOID SURGERY N/A 03/30/2018   Procedure: EXTENSIVE HEMORRHOIDECTOMY;  Surgeon: [name], MD;  Location: [facility];  Service: General;  Laterality: N/A;   INTRAMEDULLARY (IM) NAIL INTERTROCHANTERIC Right 03/24/2021   Procedure: INTRAMEDULLARY (IM)  NAIL INTERTROCHANTRIC;  Surgeon: Mordecai Rasmussen, MD;  Location: [facility];  Service: Orthopedics;  Laterality: Right;   OPEN REDUCTION INTERNAL FIXATION (ORIF) TIBIA/FIBULA FRACTURE Right 2013   broke tibia and fibula after falling down stairs   PROLAPSED UTERINE FIBROID LIGATION  2015   TOTAL ABDOMINAL HYSTERECTOMY      Social History   Socioeconomic History   Marital status: Married    Spouse name: george    Number of children: 1   Years of education: Not on file   Highest education level: Not on file  Occupational History   Occupation: Retired    Comment: Textile  Tobacco Use   Smoking status: Never   Smokeless tobacco: Never  Scientific laboratory technician Use: Never used  Substance and Sexual Activity   Alcohol use: No    Alcohol/week: 0.0 standard drinks   Drug use: No   Sexual activity: Not Currently    Birth control/protection: Surgical, Post-menopausal    Comment: hyst  Other Topics Concern   Not on file  Social History Narrative   Married   No regular exercise   Social Determinants of Health   Financial Resource Strain: Not on file  Food Insecurity: Not on file  Transportation Needs: Not on file  Physical Activity: Not on file  Stress: Not on file  Social Connections: Not on file  Intimate Partner Violence: Not on file        Objective:    BP 111/74   Pulse 86   Temp 97.9 F (36.6 C) (Temporal)   Ht $R'5\' 1"'ZK$  (1.549 m)   Wt 95 lb 3.2  oz (43.2 kg)   SpO2 95%   BMI 17.99 kg/m   Wt Readings from Last 3 Encounters:  04/13/22 95 lb 3.2 oz (43.2 kg)  02/08/22 96 lb 5.5 oz (43.7 kg)  01/06/22 99 lb (44.9 kg)    Physical Exam Vitals reviewed.  Constitutional:      General: She is not in acute distress.    Appearance: Normal appearance. She is underweight. She is not ill-appearing, toxic-appearing or diaphoretic.  HENT:     Head: Normocephalic and atraumatic.  Eyes:     General: No scleral icterus.       Right eye: No discharge.        Left eye: No discharge.     Conjunctiva/sclera: Conjunctivae normal.  Cardiovascular:     Rate and Rhythm: Normal rate.  Pulmonary:     Effort: Pulmonary effort is normal. No respiratory distress.  Musculoskeletal:        General: Normal range of motion.     Cervical back: Normal range of motion.     Lumbar back: Tenderness and bony tenderness present.  Skin:    General: Skin is warm and dry.     Capillary Refill: Capillary refill takes less than 2 seconds.  Neurological:     General: No focal deficit present.     Mental Status: She is alert and oriented to person, place, and time. Mental status is at baseline.  Psychiatric:        Mood and Affect: Mood normal.        Behavior: Behavior normal.        Thought Content: Thought content normal.        Judgment: Judgment normal.    Lab Results  Component Value Date   TSH 2.540 06/09/2021   Lab Results  Component Value Date   WBC 9.1 11/14/2021   HGB  14.5 11/14/2021   HCT 43.2 11/14/2021   MCV 100.7 (H) 11/14/2021   PLT 136 (L) 11/14/2021   Lab Results  Component Value Date   NA 141 11/25/2021   K 3.7 11/25/2021   CO2 29 11/25/2021   GLUCOSE 100 (H) 11/25/2021   BUN 21 11/25/2021   CREATININE 1.19 (H) 11/25/2021   BILITOT 0.6 08/12/2021   ALKPHOS 158 (H) 08/12/2021   AST 19 08/12/2021   ALT 10 08/12/2021   PROT 7.1 08/12/2021   ALBUMIN 4.5 08/12/2021   CALCIUM 9.5 11/25/2021   ANIONGAP 10 11/14/2021   EGFR  46 (L) 11/25/2021   Lab Results  Component Value Date   CHOL 130 08/12/2021   Lab Results  Component Value Date   HDL 51 08/12/2021   Lab Results  Component Value Date   LDLCALC 62 08/12/2021   Lab Results  Component Value Date   TRIG 92 08/12/2021   Lab Results  Component Value Date   CHOLHDL 2.5 08/12/2021   Lab Results  Component Value Date   HGBA1C 5.6 02/28/2017

## 2022-04-14 LAB — CBC WITH DIFFERENTIAL/PLATELET
Basophils Absolute: 0.1 10*3/uL (ref 0.0–0.2)
Basos: 1 %
EOS (ABSOLUTE): 0.1 10*3/uL (ref 0.0–0.4)
Eos: 2 %
Hematocrit: 43 % (ref 34.0–46.6)
Hemoglobin: 14.2 g/dL (ref 11.1–15.9)
Immature Grans (Abs): 0 10*3/uL (ref 0.0–0.1)
Immature Granulocytes: 0 %
Lymphocytes Absolute: 1.2 10*3/uL (ref 0.7–3.1)
Lymphs: 21 %
MCH: 31.9 pg (ref 26.6–33.0)
MCHC: 33 g/dL (ref 31.5–35.7)
MCV: 97 fL (ref 79–97)
Monocytes Absolute: 0.6 10*3/uL (ref 0.1–0.9)
Monocytes: 11 %
Neutrophils Absolute: 3.6 10*3/uL (ref 1.4–7.0)
Neutrophils: 65 %
Platelets: 158 10*3/uL (ref 150–450)
RBC: 4.45 x10E6/uL (ref 3.77–5.28)
RDW: 12.9 % (ref 11.7–15.4)
WBC: 5.5 10*3/uL (ref 3.4–10.8)

## 2022-04-14 LAB — LIPID PANEL
Chol/HDL Ratio: 2.7 ratio (ref 0.0–4.4)
Cholesterol, Total: 136 mg/dL (ref 100–199)
HDL: 50 mg/dL (ref >39)
LDL Chol Calc (NIH): 66 mg/dL (ref 0–99)
Triglycerides: 110 mg/dL (ref 0–149)
VLDL Cholesterol Cal: 20 mg/dL (ref 5–40)

## 2022-04-14 LAB — CMP14+EGFR
ALT: 10 IU/L (ref 0–32)
AST: 16 IU/L (ref 0–40)
Albumin/Globulin Ratio: 1.6 (ref 1.2–2.2)
Albumin: 4.6 g/dL (ref 3.6–4.6)
Alkaline Phosphatase: 173 IU/L — ABNORMAL HIGH (ref 44–121)
BUN/Creatinine Ratio: 37 — ABNORMAL HIGH (ref 12–28)
BUN: 36 mg/dL — ABNORMAL HIGH (ref 8–27)
Bilirubin Total: 0.5 mg/dL (ref 0.0–1.2)
CO2: 25 mmol/L (ref 20–29)
Calcium: 9.2 mg/dL (ref 8.7–10.3)
Chloride: 100 mmol/L (ref 96–106)
Creatinine, Ser: 0.97 mg/dL (ref 0.57–1.00)
Globulin, Total: 2.8 g/dL (ref 1.5–4.5)
Glucose: 76 mg/dL (ref 70–99)
Potassium: 4 mmol/L (ref 3.5–5.2)
Sodium: 143 mmol/L (ref 134–144)
Total Protein: 7.4 g/dL (ref 6.0–8.5)
eGFR: 58 mL/min/{1.73_m2} — ABNORMAL LOW (ref >59)

## 2022-04-14 LAB — MAGNESIUM: Magnesium: 2.3 mg/dL (ref 1.6–2.3)

## 2022-04-14 LAB — TSH: TSH: 0.665 u[IU]/mL (ref 0.450–4.500)

## 2022-04-14 LAB — VITAMIN D 25 HYDROXY (VIT D DEFICIENCY, FRACTURES): Vit D, 25-Hydroxy: 41.4 ng/mL (ref 30.0–100.0)

## 2022-06-07 DIAGNOSIS — M9902 Segmental and somatic dysfunction of thoracic region: Secondary | ICD-10-CM | POA: Diagnosis not present

## 2022-06-07 DIAGNOSIS — M9901 Segmental and somatic dysfunction of cervical region: Secondary | ICD-10-CM | POA: Diagnosis not present

## 2022-06-07 DIAGNOSIS — M62830 Muscle spasm of back: Secondary | ICD-10-CM | POA: Diagnosis not present

## 2022-06-07 DIAGNOSIS — M9903 Segmental and somatic dysfunction of lumbar region: Secondary | ICD-10-CM | POA: Diagnosis not present

## 2022-06-09 DIAGNOSIS — M62830 Muscle spasm of back: Secondary | ICD-10-CM | POA: Diagnosis not present

## 2022-06-09 DIAGNOSIS — M9903 Segmental and somatic dysfunction of lumbar region: Secondary | ICD-10-CM | POA: Diagnosis not present

## 2022-06-09 DIAGNOSIS — M9902 Segmental and somatic dysfunction of thoracic region: Secondary | ICD-10-CM | POA: Diagnosis not present

## 2022-06-09 DIAGNOSIS — M9901 Segmental and somatic dysfunction of cervical region: Secondary | ICD-10-CM | POA: Diagnosis not present

## 2022-06-10 DIAGNOSIS — M9901 Segmental and somatic dysfunction of cervical region: Secondary | ICD-10-CM | POA: Diagnosis not present

## 2022-06-10 DIAGNOSIS — M62830 Muscle spasm of back: Secondary | ICD-10-CM | POA: Diagnosis not present

## 2022-06-10 DIAGNOSIS — M9903 Segmental and somatic dysfunction of lumbar region: Secondary | ICD-10-CM | POA: Diagnosis not present

## 2022-06-10 DIAGNOSIS — M9902 Segmental and somatic dysfunction of thoracic region: Secondary | ICD-10-CM | POA: Diagnosis not present

## 2022-06-14 DIAGNOSIS — M9901 Segmental and somatic dysfunction of cervical region: Secondary | ICD-10-CM | POA: Diagnosis not present

## 2022-06-14 DIAGNOSIS — M9902 Segmental and somatic dysfunction of thoracic region: Secondary | ICD-10-CM | POA: Diagnosis not present

## 2022-06-14 DIAGNOSIS — M62830 Muscle spasm of back: Secondary | ICD-10-CM | POA: Diagnosis not present

## 2022-06-14 DIAGNOSIS — M9903 Segmental and somatic dysfunction of lumbar region: Secondary | ICD-10-CM | POA: Diagnosis not present

## 2022-06-16 DIAGNOSIS — M9901 Segmental and somatic dysfunction of cervical region: Secondary | ICD-10-CM | POA: Diagnosis not present

## 2022-06-16 DIAGNOSIS — M9902 Segmental and somatic dysfunction of thoracic region: Secondary | ICD-10-CM | POA: Diagnosis not present

## 2022-06-16 DIAGNOSIS — M62830 Muscle spasm of back: Secondary | ICD-10-CM | POA: Diagnosis not present

## 2022-06-16 DIAGNOSIS — M9903 Segmental and somatic dysfunction of lumbar region: Secondary | ICD-10-CM | POA: Diagnosis not present

## 2022-06-23 ENCOUNTER — Other Ambulatory Visit: Payer: Self-pay | Admitting: Family Medicine

## 2022-06-23 DIAGNOSIS — M9902 Segmental and somatic dysfunction of thoracic region: Secondary | ICD-10-CM | POA: Diagnosis not present

## 2022-06-23 DIAGNOSIS — M62830 Muscle spasm of back: Secondary | ICD-10-CM | POA: Diagnosis not present

## 2022-06-23 DIAGNOSIS — I480 Paroxysmal atrial fibrillation: Secondary | ICD-10-CM

## 2022-06-23 DIAGNOSIS — M9903 Segmental and somatic dysfunction of lumbar region: Secondary | ICD-10-CM | POA: Diagnosis not present

## 2022-06-23 DIAGNOSIS — M9901 Segmental and somatic dysfunction of cervical region: Secondary | ICD-10-CM | POA: Diagnosis not present

## 2022-07-05 ENCOUNTER — Other Ambulatory Visit: Payer: Self-pay | Admitting: Cardiology

## 2022-07-14 ENCOUNTER — Ambulatory Visit (INDEPENDENT_AMBULATORY_CARE_PROVIDER_SITE_OTHER): Payer: Medicare HMO | Admitting: Family Medicine

## 2022-07-14 ENCOUNTER — Encounter: Payer: Self-pay | Admitting: Family Medicine

## 2022-07-14 VITALS — BP 114/74 | HR 83 | Temp 96.8°F | Resp 20 | Ht 61.0 in | Wt 95.0 lb

## 2022-07-14 DIAGNOSIS — I2510 Atherosclerotic heart disease of native coronary artery without angina pectoris: Secondary | ICD-10-CM | POA: Diagnosis not present

## 2022-07-14 DIAGNOSIS — I071 Rheumatic tricuspid insufficiency: Secondary | ICD-10-CM | POA: Diagnosis not present

## 2022-07-14 DIAGNOSIS — M8000XD Age-related osteoporosis with current pathological fracture, unspecified site, subsequent encounter for fracture with routine healing: Secondary | ICD-10-CM

## 2022-07-14 DIAGNOSIS — K219 Gastro-esophageal reflux disease without esophagitis: Secondary | ICD-10-CM

## 2022-07-14 DIAGNOSIS — E782 Mixed hyperlipidemia: Secondary | ICD-10-CM

## 2022-07-14 DIAGNOSIS — M5136 Other intervertebral disc degeneration, lumbar region: Secondary | ICD-10-CM

## 2022-07-14 DIAGNOSIS — K5903 Drug induced constipation: Secondary | ICD-10-CM

## 2022-07-14 DIAGNOSIS — I700 Atherosclerosis of aorta: Secondary | ICD-10-CM | POA: Diagnosis not present

## 2022-07-14 DIAGNOSIS — M545 Low back pain, unspecified: Secondary | ICD-10-CM

## 2022-07-14 DIAGNOSIS — I110 Hypertensive heart disease with heart failure: Secondary | ICD-10-CM | POA: Diagnosis not present

## 2022-07-14 DIAGNOSIS — M51369 Other intervertebral disc degeneration, lumbar region without mention of lumbar back pain or lower extremity pain: Secondary | ICD-10-CM

## 2022-07-14 DIAGNOSIS — I480 Paroxysmal atrial fibrillation: Secondary | ICD-10-CM | POA: Diagnosis not present

## 2022-07-14 DIAGNOSIS — I5032 Chronic diastolic (congestive) heart failure: Secondary | ICD-10-CM

## 2022-07-14 NOTE — Progress Notes (Signed)
Assessment & Plan:  1. Hypertensive heart disease with chronic diastolic congestive heart failure ([place]) Well controlled on current regimen.  - CMP14+EGFR  2. Atherosclerosis of native coronary artery of native heart without angina pectoris Continue simvastatin and aspirin. - CMP14+EGFR  3. Aortic atherosclerosis (HCC) Continue simvastatin and aspirin. - CMP14+EGFR  4. Mixed hyperlipidemia Well controlled on current regimen.  - CMP14+EGFR  5. Paroxysmal atrial fibrillation (HCC) Continue metoprolol for rate control and aspirin.  Patient is no longer anticoagulated with something stronger due to risk outweighing the benefits.  6. Severe tricuspid valve regurgitation Continue current regimen.  7. Gastroesophageal reflux disease without esophagitis Well controlled on current regimen.  - CMP14+EGFR  8. Drug induced constipation Well controlled on current regimen.  - CMP14+EGFR  9. Age-related osteoporosis with current pathological fracture with routine healing, subsequent encounter Patient declines medication to treat.  10-11. Degenerative disc disease, lumbar/Lumbar pain Patient will continue taking Tylenol as needed as she does not wish to receive a refill of Norco. - CMP14+EGFR   Return in about 3 months (around 10/13/2022) for follow-up of chronic medication conditions with Rakes.  [name], MSN, APRN, FNP-C [facility]  Subjective:    Patient ID: [name], female    DOB: April 16, 1941, 81 y.o.   MRN: [id number]  Patient Care Team: Loman Brooklyn, FNP as PCP - General (Family Medicine) Minus Breeding, MD as PCP - Cardiology (Cardiology) [name], MD (Inactive) as Attending Physician (Gastroenterology) Melina Schools, OD as Consulting Physician (Optometry) Sherran Needs, NP as Consulting Physician (Cardiology)   Chief Complaint:  Chief Complaint  Patient presents with   Medical Management of Chronic Issues     HPI: Norma Barajas is a 81 y.o. female presenting on 07/14/2022 for Medical Management of Chronic Issues  Patient is accompanied by her husband who she is okay with being present.  Pain assessment: Cause of pain- lumbar degenerative disc disease Lumbar spine x-ray 11/10/2021: Anterolisthesis of L3 on L4 and L4 on L5.  MRI Lumbar spine 01/12/2022:  1. Compression deformities of the T12, L1, L3, and L5 vertebral bodies, likely acute to subacute in chronicity. The L1 compression fracture is most severe, with essentially complete loss of vertebral body height centrally. There was likely a mild compression fracture at this level on the radiographs from 11/10/2021, but this has significantly worsened in the interim. The other fractures are new since the prior radiograph. 2. Mild bony retropulsion at L1 contributes to mild spinal canal stenosis but no cord compression. There is minimal bony retropulsion at the other levels. 3. These fractures are most likely benign osteoporotic compression fractures. Follow up lumbar spine MRI with contrast could be considered in ~2 months to exclude underlying lesions. 4. Multilevel degenerative changes as detailed above resulting in up to mild-to-moderate spinal canal stenosis at T11-T12, moderate spinal canal stenosis with crowding of the subarticular zones at L3-L4, right worse than left subarticular zone narrowing with possible impingement of the traversing right L5 nerve root and moderate left neural foraminal stenosis at L4-L5. 5. Remote appearing right sacral insufficiency fracture.  Pain location- low back Frequency- daily What increases pain- coughing, sneezing, movement What makes pain better- rest, heating pad, Norco, Tylenol, Voltaren gel Effects on ADL - impairs ability to perform ADLs Any change in general medical condition- no. She has stopped taking the Norco as she does not like to be reliant on pain medication.  She has been seen by a neurosurgeon  and reports she was told they could not do surgery because she does not have enough "good bones".  They gave her a back brace, which she does not find helpful.  Constipation: Taking Senokot-S nightly.  States she is having good bowel movements.    A-Fib: patient previously reports she could feel herself in A-Fib and racing all the time. She has had a cardioversion in the past which only briefly converted. She does not wish to do this again. She has taken amiodarone in the past and did not like how it made her feel. She could not afford Tikosyn. Previously treated with Satolol which she failed. Metoprolol controls her symptoms. Coumadin was discontinued as her risks outweigh her benefits due to recurrent falls. She does take aspirin daily.   CHF/Edema: She is taking torsemide as prescribed 20 mg BID. States she tried to stop taking it but it caused chest discomfort so she resumed it and symptoms resolved. She is not taking potassium. She states she eats foods very rich in potassium to offset not taking it. She was previously prescribed an appetite stimulant which she did not take. She does not wear compression hose.  Osteoporosis: Not taking or agreeable to calcium, vitamin D, or any medication to treat.  Atherosclerosis: Taking aspirin and simvastatin.  GERD: Symptoms controlled with omeprazole.  CKD: Patient was previously referred to nephrology who she last saw on 01/20/2022.  After their work-up she was referred to hematology who she saw on 02/08/2022.  Patient reports she will not be returning to either.  New complaints: None   Social history:  Relevant past medical, surgical, family and social history reviewed and updated as indicated. Interim medical history since our last visit reviewed.  Allergies and medications reviewed and updated.  DATA REVIEWED: CHART IN EPIC  ROS: Negative unless specifically indicated above in HPI.    Current Outpatient Medications:    acetaminophen  (TYLENOL) 650 MG CR tablet, Take 650 mg by mouth every 6 (six) hours as needed for pain. Displaced intertrochanteric fracture of right femur, Disp: , Rfl:    amLODipine (NORVASC) 5 MG tablet, TAKE 1 TABLET (5 MG TOTAL) BY MOUTH DAILY., Disp: 90 tablet, Rfl: 0   ASPIRIN LOW DOSE 81 MG tablet, TAKE 1 TABLET TWICE DAILY, Disp: 180 tablet, Rfl: 1   bisacodyl (DULCOLAX) 10 MG suppository, Place 1 suppository (10 mg total) rectally as needed for moderate constipation., Disp: 4 suppository, Rfl: 2   diclofenac Sodium (VOLTAREN) 1 % GEL, Apply 2 g topically 4 (four) times daily., Disp: 50 g, Rfl: 0   ferrous sulfate 325 (65 FE) MG EC tablet, Take 1 tablet by mouth daily., Disp: , Rfl:    metoprolol tartrate (LOPRESSOR) 50 MG tablet, TAKE 1 TABLET TWICE DAILY, Disp: 180 tablet, Rfl: 2   omeprazole (PRILOSEC) 20 MG capsule, Take 1 capsule (20 mg total) by mouth daily., Disp: 90 capsule, Rfl: 3   ondansetron (ZOFRAN-ODT) 4 MG disintegrating tablet, Take 1 tablet (4 mg total) by mouth every 8 (eight) hours as needed for nausea or vomiting., Disp: 30 tablet, Rfl: 0   sennosides-docusate sodium (SENOKOT-S) 8.6-50 MG tablet, Take 1 tablet by mouth at bedtime., Disp: 90 tablet, Rfl: 3   simvastatin (ZOCOR) 20 MG tablet, TAKE 1 TABLET EVERY DAY, Disp: 90 tablet, Rfl: 0   torsemide (DEMADEX) 20 MG tablet, TAKE 1 TABLET TWICE DAILY, Disp: 180 tablet, Rfl: 0   HYDROcodone-acetaminophen (NORCO) 5-325 MG tablet, Take 1 tablet by mouth every 8 (eight) hours  as needed for moderate pain. (Patient not taking: Reported on 07/14/2022), Disp: 90 tablet, Rfl: 0   HYDROcodone-acetaminophen (NORCO) 5-325 MG tablet, Take 1 tablet by mouth every 8 (eight) hours as needed for moderate pain. (Patient not taking: Reported on 07/14/2022), Disp: 90 tablet, Rfl: 0   HYDROcodone-acetaminophen (NORCO) 5-325 MG tablet, Take 1 tablet by mouth every 8 (eight) hours as needed for moderate pain. 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 Nonobstructive minimal CAD 10/2005.   Depression    Diastolic dysfunction    Grade 1. Ejection fraction 60-65%.   Dysrhythmia    a fib   Erosive esophagitis    Essential hypertension    Fracture of ramus of right pubis with routine healing 12/09/2020   GERD (gastroesophageal reflux disease)    Grade III hemorrhoids    History of hiatal hernia    Hypercholesterolemia    Internal hemorrhoids with complication 5/67/0141   OCT 2015 FLEX SIG/IH BANDING     Intertrochanteric fracture of femur ([place])    5/16 - 03/26/2021 fracture sustained in a mechanical fall.  IM nailing performed 5/17 by Dr Larena Glassman Warfarin held because of posttraumatic pelvic hematoma.  Postop DVT with 81 mg aspirin twice daily x28 days.   Major depressive disorder, recurrent, moderate ([place]) 12/09/2020   Migraine    "used to have them right bad; I don't now" (04/11/2015)   MITRAL REGURGITATION 04/27/2010   Qualifier: Diagnosis of  By: [name], MD, [name]    Osteoporosis    Paroxysmal atrial fibrillation [facility])    Pelvic hematoma, female 03/27/2021   Sustained in mechanical fall.  Warfarin held.  IM nailing performed 5/17 with postop DVT prophylaxis 81 mg twice daily x28 days.   PONV (postoperative nausea and vomiting)    Vertigo     Past Surgical History:   Procedure Laterality Date   BRAVO [facility] STUDY  11/15/2012   Procedure: BRAVO [place];  Surgeon: [name], MD;  [facility];  Service: Endoscopy;;   CARDIOVERSION N/A 05/16/2020   Procedure: CARDIOVERSION;  Surgeon: Donato Heinz, MD;  Location: [place];  Service: Cardiovascular;  Laterality: N/A;   CATARACT EXTRACTION W/ INTRAOCULAR LENS  IMPLANT, BILATERAL Bilateral    COLONOSCOPY  2008   Dr. [name]: internal hemorrhoids    DILATION AND CURETTAGE OF UTERUS     ESOPHAGEAL DILATION N/A 06/16/2021   Procedure: ESOPHAGEAL DILATION;  Surgeon: Harvel Quale, MD;  [facility];  Service: Gastroenterology;  Laterality: N/A;   ESOPHAGOGASTRODUODENOSCOPY (EGD) WITH ESOPHAGEAL DILATION  2001   Dr. [name]: erosive esophagitis, esophageal stricture, duodenitis, s/p Savary dilation   ESOPHAGOGASTRODUODENOSCOPY (EGD) WITH ESOPHAGEAL DILATION  11/15/2012   CVU:[id number]  web was found & [place] DYAPHAGIA/Polyp was found in the gastric body and gastric fundus/ gastritis on bx   ESOPHAGOGASTRODUODENOSCOPY (EGD) WITH PROPOFOL N/A 06/16/2021   Procedure: ESOPHAGOGASTRODUODENOSCOPY (EGD) WITH PROPOFOL;  Surgeon: Harvel Quale, MD;  [facility];  Service: Gastroenterology;  Laterality: N/A;  8:15   EYE SURGERY Bilateral    "laser OR after cataract OR; cause I couldn't see"   FLEXIBLE SIGMOIDOSCOPY N/A 08/22/2014   mild diverticulosis in sigmoid, moderate sized Grade 3 hemorrhoids s/p banding X 3.    FRACTURE SURGERY Right    below the knee - 2 bones broke has plates   HEMORRHOID BANDING N/A 08/22/2014   Procedure: HEMORRHOID BANDING;  Surgeon: [name], MD;  [facility];  Service: Endoscopy;  Laterality: N/A;   HEMORRHOID SURGERY N/A 03/30/2018   Procedure: EXTENSIVE HEMORRHOIDECTOMY;  Surgeon: [name], MD;  Location: [facility];  Service: General;  Laterality: N/A;   INTRAMEDULLARY (IM) NAIL  INTERTROCHANTERIC Right 03/24/2021   Procedure: INTRAMEDULLARY (IM) NAIL INTERTROCHANTRIC;  Surgeon: Mordecai Rasmussen, MD;  Location: [facility];  Service: Orthopedics;  Laterality: Right;   OPEN REDUCTION INTERNAL FIXATION (ORIF) TIBIA/FIBULA FRACTURE Right 2013   broke tibia and fibula after falling down stairs   PROLAPSED UTERINE FIBROID LIGATION  2015   TOTAL ABDOMINAL HYSTERECTOMY      Social History   Socioeconomic History   Marital status: Married    Spouse name: george    Number of children: 1   Years of education: Not on file   Highest education level: Not on file  Occupational History   Occupation: Retired    Comment: Textile  Tobacco Use   Smoking status: Never   Smokeless tobacco: Never  Scientific laboratory technician Use: Never used  Substance and Sexual Activity   Alcohol use: No    Alcohol/week: 0.0 standard drinks of alcohol   Drug use: No   Sexual activity: Not Currently    Birth control/protection: Surgical, Post-menopausal    Comment: hyst  Other Topics Concern   Not on file  Social History Narrative   Married   No regular exercise   Social Determinants of Health   Financial Resource Strain: Not on file  Food Insecurity: Not on file  Transportation Needs: Not on file  Physical Activity: Not on file  Stress: Not on file  Social Connections: Not on file  Intimate Partner Violence: Not on file        Objective:    BP 114/74   Pulse 83   Temp (!) 96.8 F (36 C)   Resp 20   Ht 5' 1"  (1.549 m)   Wt 95 lb (43.1 kg)   SpO2 94%   BMI 17.95 kg/m   Wt Readings from Last 3 Encounters:  07/14/22 95 lb (43.1 kg)  04/13/22 95 lb 3.2 oz (43.2 kg)  02/08/22 96 lb 5.5 oz (43.7 kg)    Physical Exam Vitals reviewed.  Constitutional:      General: She is not in acute distress.    Appearance: Normal appearance. She is underweight. She is not ill-appearing, toxic-appearing or diaphoretic.  HENT:     Head: Normocephalic and atraumatic.  Eyes:     General: No  scleral icterus.       Right eye: No discharge.        Left eye: No discharge.     Conjunctiva/sclera: Conjunctivae normal.  Cardiovascular:     Rate and  Rhythm: Normal rate.  Pulmonary:     Effort: Pulmonary effort is normal. No respiratory distress.  Musculoskeletal:        General: Normal range of motion.     Cervical back: Normal range of motion.     Lumbar back: Tenderness and bony tenderness present.  Skin:    General: Skin is warm and dry.     Capillary Refill: Capillary refill takes less than 2 seconds.  Neurological:     General: No focal deficit present.     Mental Status: She is alert and oriented to person, place, and time. Mental status is at baseline.     Gait: Gait abnormal (ambulating with walker).  Psychiatric:        Mood and Affect: Mood normal.        Behavior: Behavior normal.        Thought Content: Thought content normal.        Judgment: Judgment normal.     Lab Results  Component Value Date   TSH 0.665 04/13/2022   Lab Results  Component Value Date   WBC 5.5 04/13/2022   HGB 14.2 04/13/2022   HCT 43.0 04/13/2022   MCV 97 04/13/2022   PLT 158 04/13/2022   Lab Results  Component Value Date   NA 143 04/13/2022   K 4.0 04/13/2022   CO2 25 04/13/2022   GLUCOSE 76 04/13/2022   BUN 36 (H) 04/13/2022   CREATININE 0.97 04/13/2022   BILITOT 0.5 04/13/2022   ALKPHOS 173 (H) 04/13/2022   AST 16 04/13/2022   ALT 10 04/13/2022   PROT 7.4 04/13/2022   ALBUMIN 4.6 04/13/2022   CALCIUM 9.2 04/13/2022   ANIONGAP 10 11/14/2021   EGFR 58 (L) 04/13/2022   Lab Results  Component Value Date   CHOL 136 04/13/2022   Lab Results  Component Value Date   HDL 50 04/13/2022   Lab Results  Component Value Date   LDLCALC 66 04/13/2022   Lab Results  Component Value Date   TRIG 110 04/13/2022   Lab Results  Component Value Date   CHOLHDL 2.7 04/13/2022   Lab Results  Component Value Date   HGBA1C 5.4 04/13/2022

## 2022-07-15 LAB — CMP14+EGFR
ALT: 10 IU/L (ref 0–32)
AST: 20 IU/L (ref 0–40)
Albumin/Globulin Ratio: 1.8 (ref 1.2–2.2)
Albumin: 4.4 g/dL (ref 3.7–4.7)
Alkaline Phosphatase: 137 IU/L — ABNORMAL HIGH (ref 44–121)
BUN/Creatinine Ratio: 28 (ref 12–28)
BUN: 31 mg/dL — ABNORMAL HIGH (ref 8–27)
Bilirubin Total: 0.6 mg/dL (ref 0.0–1.2)
CO2: 26 mmol/L (ref 20–29)
Calcium: 9.5 mg/dL (ref 8.7–10.3)
Chloride: 99 mmol/L (ref 96–106)
Creatinine, Ser: 1.11 mg/dL — ABNORMAL HIGH (ref 0.57–1.00)
Globulin, Total: 2.5 g/dL (ref 1.5–4.5)
Glucose: 78 mg/dL (ref 70–99)
Potassium: 4.4 mmol/L (ref 3.5–5.2)
Sodium: 143 mmol/L (ref 134–144)
Total Protein: 6.9 g/dL (ref 6.0–8.5)
eGFR: 49 mL/min/{1.73_m2} — ABNORMAL LOW (ref >59)

## 2022-08-10 ENCOUNTER — Inpatient Hospital Stay: Payer: Medicare HMO

## 2022-08-11 ENCOUNTER — Ambulatory Visit (INDEPENDENT_AMBULATORY_CARE_PROVIDER_SITE_OTHER): Payer: Medicare HMO

## 2022-08-11 DIAGNOSIS — Z23 Encounter for immunization: Secondary | ICD-10-CM | POA: Diagnosis not present

## 2022-08-17 ENCOUNTER — Inpatient Hospital Stay: Payer: Medicare HMO | Admitting: Physician Assistant

## 2022-08-29 DIAGNOSIS — R9431 Abnormal electrocardiogram [ECG] [EKG]: Secondary | ICD-10-CM | POA: Insufficient documentation

## 2022-08-29 NOTE — Progress Notes (Unsigned)
Cardiology Office Note   Date:  09/01/2022   ID:  Norma, Barajas [birth year], MRN [id number]  PCP:  Baruch Gouty, FNP  Cardiologist:   Minus Breeding, MD   Chief Complaint  Patient presents with   Atrial Fibrillation        History of Present Illness: Norma Barajas is a 81 y.o. female who presents for follow up of atrial fibrillation.  She has been seen by Dr. [name] and Dr. Curt Bears.  She did have an essentially normal echo in 2017.  She was treated with Tikosyn for a while but could not afford this.  She has been treated with Sotalol.   She has had recurrent fibrillation and was followed in the Atrial Fib clinic.  She has been treated with amiodarone and DCCV and she had recurrent fib.  She was to have another DCCV but she declined and wanted to pursue rate control with amiodarone.    Since I last saw her she has had no acute cardiac complaints.  She has a hard time as she is emptying care of her husband who is frail.  Their only son died in a car accident about 3 years ago.  They have a niece who sometimes helps him with transportation.  However, she still doing chores around the house.  She walks to the grocery store. The patient denies any new symptoms such as chest discomfort, neck or arm discomfort. There has been no new shortness of breath, PND or orthopnea. There have been no reported palpitations, presyncope or syncope.     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 Nonobstructive minimal CAD 10/2005.   Depression    Diastolic dysfunction    Grade 1. Ejection fraction 60-65%.   Dysrhythmia    a fib   Erosive esophagitis    Essential hypertension    Fracture of ramus of right pubis with routine healing 12/09/2020   GERD (gastroesophageal  reflux disease)    Grade III hemorrhoids    History of hiatal hernia    History of prediabetes    Hypercholesterolemia    Internal hemorrhoids with complication 12/45/8099   OCT 2015 FLEX SIG/IH BANDING     Intertrochanteric fracture of femur ([place])    5/16 - 03/26/2021 fracture sustained in a mechanical fall.  IM nailing performed 5/17 by Dr Larena Glassman Warfarin held because of posttraumatic pelvic hematoma.  Postop DVT with 81 mg aspirin twice daily x28 days.   Major depressive disorder, recurrent, moderate ([place]) 12/09/2020   Migraine    "used to have them right bad; I don't now" (04/11/2015)   MITRAL REGURGITATION 04/27/2010   Qualifier: Diagnosis of  By: [name], MD, [name]    Osteoporosis    Paroxysmal atrial fibrillation [facility])    Pelvic hematoma, female 03/27/2021   Sustained in mechanical fall.  Warfarin held.  IM nailing performed 5/17 with postop DVT prophylaxis 81 mg twice daily x28 days.   PONV (postoperative nausea and vomiting)    Vertigo     Past Surgical History:  Procedure Laterality Date   BRAVO [facility] STUDY  11/15/2012   Procedure: BRAVO [place];  Surgeon: [name], MD;  Location: [facility]  ENDO SU[facility];  Service: Endoscopy;;   CARDIOVERSION N/A 05/16/2020   Procedure: CARDIOVERSION;  Surgeon: Donato Heinz, MD;  Location: [place];  Service: Cardiovascular;  Laterality: N/A;   CATARACT EXTRACTION W/ INTRAOCULAR LENS  IMPLANT, BILATERAL Bilateral    COLONOSCOPY  2008   Dr. [name]: internal hemorrhoids    DILATION AND CURETTAGE OF UTERUS     ESOPHAGEAL DILATION N/A 06/16/2021   Procedure: ESOPHAGEAL DILATION;  Surgeon: Harvel Quale, MD;  Location: [facility];  Service: Gastroenterology;  Laterality: N/A;   ESOPHAGOGASTRODUODENOSCOPY (EGD) WITH ESOPHAGEAL DILATION  2001   Dr. [name]: erosive esophagitis, esophageal stricture, duodenitis, s/p Savary dilation   ESOPHAGOGASTRODUODENOSCOPY (EGD) WITH ESOPHAGEAL DILATION  11/15/2012    [id number] web was found & MOST LIKELY CAUSE FOR DYAPHAGIA/Polyp was found in the gastric body and gastric fundus/ gastritis on bx   ESOPHAGOGASTRODUODENOSCOPY (EGD) WITH PROPOFOL N/A 06/16/2021   Procedure: ESOPHAGOGASTRODUODENOSCOPY (EGD) WITH PROPOFOL;  Surgeon: Harvel Quale, MD;  Location[facility];  Service: Gastroenterology;  Laterality: N/A;  8:15   EYE SURGERY Bilateral    "laser OR after cataract OR; cause I couldn't see"   FLEXIBLE SIGMOIDOSCOPY N/A 08/22/2014   mild diverticulosis in sigmoid, moderate sized Grade 3 hemorrhoids s/p banding X 3.    FRACTURE SURGERY Right    below the knee - 2 bones broke has plates   HEMORRHOID BANDING N/A 08/22/2014   Procedure: HEMORRHOID BANDING;  Surgeon: [name], MD;  Location[facility];  Service: Endoscopy;  Laterality: N/A;   HEMORRHOID SURGERY N/A 03/30/2018   Procedure: EXTENSIVE HEMORRHOIDECTOMY;  Surgeon: [name], MD;  Location: [facility] ORS;  Service: General;  Laterality: N/A;   INTRAMEDULLARY (IM) NAIL INTERTROCHANTERIC Right 03/24/2021   Procedure: INTRAMEDULLARY (IM) NAIL INTERTROCHANTRIC;  Surgeon: Mordecai Rasmussen, MD;  Location: [facility] ORS;  Service: Orthopedics;  Laterality: Right;   OPEN REDUCTION INTERNAL FIXATION (ORIF) TIBIA/FIBULA FRACTURE Right 2013   broke tibia and fibula after falling down stairs   PROLAPSED UTERINE FIBROID LIGATION  2015   TOTAL ABDOMINAL HYSTERECTOMY       Current Outpatient Medications  Medication Sig Dispense Refill   acetaminophen (TYLENOL) 650 MG CR tablet Take 650 mg by mouth every 6 (six) hours as needed for pain. Displaced intertrochanteric fracture of right femur     amLODipine (NORVASC) 5 MG tablet TAKE 1 TABLET (5 MG TOTAL) BY MOUTH DAILY. 90 tablet 0   ASPIRIN LOW DOSE 81 MG tablet TAKE 1 TABLET TWICE DAILY 180 tablet 1   bisacodyl (DULCOLAX) 10 MG suppository Place 1 suppository (10 mg total) rectally as needed for moderate constipation. 4 suppository 2    diclofenac Sodium (VOLTAREN) 1 % GEL Apply 2 g topically 4 (four) times daily. 50 g 0   ferrous sulfate 325 (65 FE) MG EC tablet Take 1 tablet by mouth daily.     metoprolol tartrate (LOPRESSOR) 50 MG tablet Take 1 tablet (50 mg total) by mouth 2 (two) times daily. 180 tablet 3   omeprazole (PRILOSEC) 20 MG capsule Take 1 capsule (20 mg total) by mouth daily. 90 capsule 3   ondansetron (ZOFRAN-ODT) 4 MG disintegrating tablet Take 1 tablet (4 mg total) by mouth every 8 (eight) hours as needed for nausea or vomiting. 30 tablet 0   sennosides-docusate sodium (SENOKOT-S) 8.6-50 MG tablet Take 1 tablet by mouth at bedtime. 90 tablet 3   simvastatin (ZOCOR) 20 MG tablet TAKE 1 TABLET EVERY DAY 90 tablet 0  torsemide (DEMADEX) 20 MG tablet TAKE 1 TABLET TWICE DAILY 180 tablet 0   No current facility-administered medications for this visit.    Allergies:   Codeine, Doxycycline, Triamterene-hctz, Atorvastatin, Crestor [rosuvastatin calcium], Morphine, and Risedronate sodium    ROS:  Please see the history of present illness.   Otherwise, review of systems are positive for insomnia.   All other systems are reviewed and negative.    PHYSICAL EXAM: VS:  BP 112/80   Pulse 94   Ht '5\' 1"'$  (1.549 m)   Wt 100 lb (45.4 kg)   BMI 18.89 kg/m  , BMI Body mass index is 18.89 kg/m. GENERAL:  Frail appearing NECK:  No jugular venous distention, waveform within normal limits, carotid upstroke brisk and symmetric, no bruits, no thyromegaly LUNGS:  Clear to auscultation bilaterally CHEST:  Unremarkable HEART:  PMI not displaced or sustained,S1 and S2 within normal limits, no S3, no no clicks, no rubs, no murmurs, irregular ABD:  Flat, positive bowel sounds normal in frequency in pitch, no bruits, no rebound, no guarding, no midline pulsatile mass, no hepatomegaly, no splenomegaly EXT:  2 plus pulses throughout, no edema, no cyanosis no clubbing   EKG:  EKG is  ordered today. Atrial fibrillation, rate  94,axis within normal limits, intervals within normal limits, no acute ST-T wave changes.  Low voltage in limb and chest leads.    Recent Labs: 04/13/2022: Hemoglobin 14.2; Magnesium 2.3; Platelets 158; TSH 0.665 07/14/2022: ALT 10; BUN 31; Creatinine, Ser 1.11; Potassium 4.4; Sodium 143    Lipid Panel    Component Value Date/Time   CHOL 136 04/13/2022 1056   CHOL 192 05/15/2013 0829   TRIG 110 04/13/2022 1056   TRIG 258 (H) 03/19/2014 0812   TRIG 179 (H) 05/15/2013 0829   HDL 50 04/13/2022 1056   HDL 45 03/19/2014 0812   HDL 44 05/15/2013 0829   CHOLHDL 2.7 04/13/2022 1056   CHOLHDL 3.9 02/10/2016 0208   VLDL 33 02/10/2016 0208   LDLCALC 66 04/13/2022 1056   LDLCALC 101 (H) 03/19/2014 0812   LDLCALC 112 (H) 05/15/2013 0829      Wt Readings from Last 3 Encounters:  09/01/22 100 lb (45.4 kg)  07/14/22 95 lb (43.1 kg)  04/13/22 95 lb 3.2 oz (43.2 kg)      Other studies Reviewed: Additional studies/ records that were reviewed today include: Labs Review of the above records demonstrates: See elsewhere   ASSESSMENT AND PLAN:   Essential hypertension: Her blood pressure is running low.  I Minna reduce the metoprolol to 50 mg twice daily.   Atherosclerosis of native coronary artery of native heart without angina pectoris:   She is not having any further chest discomfort.  No change in therapy.   Atrial fibrillation ([place]):   She has what seems to now be chronic atrial fibrillation.  She is getting good rate control.  She is not an anticoagulant candidate given her frequent falls.  I think she is too frail to consider a Watchman.   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 ([place]):   She seems to be euvolemic.  No change in therapy.  Mixed hyperlipidemia: Her lipids are excellent with an LDL of 66 and HDL of 50.  No change in therapy.  Current medicines are reviewed at length with the patient today.  The patient does not have concerns  regarding medicines.  The following changes have been made: None  Labs/ tests ordered today include:   None  Orders Placed This Encounter  Procedures   EKG 12-Lead      Disposition:   FU with me in 12 months     Signed, Minus Breeding, MD  09/01/2022 2:57 PM    [place]

## 2022-09-01 ENCOUNTER — Encounter: Payer: Self-pay | Admitting: Cardiology

## 2022-09-01 ENCOUNTER — Ambulatory Visit: Payer: Medicare HMO | Admitting: Cardiology

## 2022-09-01 VITALS — BP 112/80 | HR 94 | Ht 61.0 in | Wt 100.0 lb

## 2022-09-01 DIAGNOSIS — I480 Paroxysmal atrial fibrillation: Secondary | ICD-10-CM

## 2022-09-01 DIAGNOSIS — E785 Hyperlipidemia, unspecified: Secondary | ICD-10-CM

## 2022-09-01 DIAGNOSIS — R9431 Abnormal electrocardiogram [ECG] [EKG]: Secondary | ICD-10-CM

## 2022-09-01 DIAGNOSIS — I5032 Chronic diastolic (congestive) heart failure: Secondary | ICD-10-CM

## 2022-09-01 DIAGNOSIS — I482 Chronic atrial fibrillation, unspecified: Secondary | ICD-10-CM | POA: Diagnosis not present

## 2022-09-01 DIAGNOSIS — I10 Essential (primary) hypertension: Secondary | ICD-10-CM

## 2022-09-01 MED ORDER — METOPROLOL TARTRATE 50 MG PO TABS: 50.0000 mg | ORAL_TABLET | Freq: Two times a day (BID) | ORAL | 3 refills | Status: DC

## 2022-09-01 NOTE — Patient Instructions (Signed)
Medication Instructions:  Please decrease Metoprolol Tartrate to 50 mg twice a day. Continue all other medications as listed.  *If you need a refill on your cardiac medications before your next appointment, please call your pharmacy*  Follow-Up: At [facility], you and your health needs are our priority.  As part of our continuing mission to provide you with exceptional heart care, we have created designated Provider Care Teams.  These Care Teams include your primary Cardiologist (physician) and Advanced Practice Providers (APPs -  Physician Assistants and Nurse Practitioners) who all work together to provide you with the care you need, when you need it.  We recommend signing up for the patient portal called "MyChart".  Sign up information is provided on this After Visit Summary.  MyChart is used to connect with patients for Virtual Visits (Telemedicine).  Patients are able to view lab/test results, encounter notes, upcoming appointments, etc.  Non-urgent messages can be sent to your provider as well.   To learn more about what you can do with MyChart, go to [email or web address].    Your next appointment:   1 year(s)  The format for your next appointment:   In Person  Provider:   Minus Breeding, MD     Important Information About Sugar

## 2022-09-15 ENCOUNTER — Other Ambulatory Visit: Payer: Self-pay | Admitting: Family Medicine

## 2022-09-22 ENCOUNTER — Other Ambulatory Visit: Payer: Self-pay | Admitting: Family Medicine

## 2022-09-22 DIAGNOSIS — I5032 Chronic diastolic (congestive) heart failure: Secondary | ICD-10-CM

## 2022-10-14 ENCOUNTER — Ambulatory Visit: Payer: Medicare HMO | Admitting: Family Medicine

## 2022-11-03 ENCOUNTER — Ambulatory Visit (INDEPENDENT_AMBULATORY_CARE_PROVIDER_SITE_OTHER): Payer: Medicare HMO | Admitting: Family Medicine

## 2022-11-03 ENCOUNTER — Encounter: Payer: Self-pay | Admitting: Family Medicine

## 2022-11-03 VITALS — BP 136/78 | HR 84 | Temp 97.5°F | Ht 61.0 in | Wt 104.8 lb

## 2022-11-03 DIAGNOSIS — E611 Iron deficiency: Secondary | ICD-10-CM | POA: Diagnosis not present

## 2022-11-03 DIAGNOSIS — I700 Atherosclerosis of aorta: Secondary | ICD-10-CM

## 2022-11-03 DIAGNOSIS — I10 Essential (primary) hypertension: Secondary | ICD-10-CM

## 2022-11-03 DIAGNOSIS — N1831 Chronic kidney disease, stage 3a: Secondary | ICD-10-CM

## 2022-11-03 DIAGNOSIS — I071 Rheumatic tricuspid insufficiency: Secondary | ICD-10-CM | POA: Diagnosis not present

## 2022-11-03 DIAGNOSIS — E782 Mixed hyperlipidemia: Secondary | ICD-10-CM | POA: Diagnosis not present

## 2022-11-03 DIAGNOSIS — N184 Chronic kidney disease, stage 4 (severe): Secondary | ICD-10-CM | POA: Insufficient documentation

## 2022-11-03 DIAGNOSIS — E876 Hypokalemia: Secondary | ICD-10-CM | POA: Diagnosis not present

## 2022-11-03 DIAGNOSIS — I480 Paroxysmal atrial fibrillation: Secondary | ICD-10-CM

## 2022-11-03 NOTE — Progress Notes (Signed)
Subjective:  Patient ID: Norma Barajas, female    DOB: 10-Jul-1941, 81 y.o.   MRN: [id number]  Patient Care Team: Baruch Gouty, FNP as PCP - General (Family Medicine) Minus Breeding, MD as PCP - Cardiology (Cardiology) [name], MD (Inactive) as Attending Physician (Gastroenterology) Melina Schools, OD as Consulting Physician (Optometry) Sherran Needs, NP as Consulting Physician (Cardiology)   Chief Complaint:  Establish Care [name] patient ) and Medical Management of Chronic Issues (3 month follow up )   HPI: Norma Barajas is a 81 y.o. female presenting on 11/03/2022 for Establish Care [name] patient ) and Medical Management of Chronic Issues (3 month follow up )  Pt presents today to establish care with new PCP. States her husband passed away last 12/22/22 and she is dealing with this as best as she can.   1. Paroxysmal atrial fibrillation ([place]) 2. Aortic atherosclerosis ([place]) 3. Severe tricuspid valve regurgitation Lat visit with cardiology 08/2022. She is rate controlled and not on anticoagulation due to her history of recurrent falls. She denies palpitations, leg swelling, chest pain, or worsening fatigue. She does have exertional shortness of breath. Denies dizziness or syncope.   4. Mixed hyperlipidemia On low dose Zocor and reports she is tolerating well. Does not follow a specific diet or exercise routine.   5. Chronic kidney disease ([place]) Pt denies changes in urinary output. Last creatinine 1.11. eGFR 49.   6. Iron deficiency Is on iron repletion therapy. Denies constipation from iron therapy. Denies abnormal bleeding or bruising. No chest pain, shortness of breath, weakness, confusion, palpitations, or syncope.    7. Essential hypertension  On amlodipine, lopressor, and torsemide. No headaches, leg swelling, chest pain, shortness of breath, weakness, confusion, or visual changes. Compliant with medications.   Relevant past medical, surgical, family, and  social history reviewed and updated as indicated.  Allergies and medications reviewed and updated. Data reviewed: Chart in Epic.   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 Nonobstructive minimal CAD 10/2005.   Depression    Diastolic dysfunction    Grade 1. Ejection fraction 60-65%.   Dysrhythmia    a fib   Erosive esophagitis    Essential hypertension    Fracture of ramus of right pubis with routine healing 12/09/2020   GERD (gastroesophageal reflux disease)    Grade III hemorrhoids    History of hiatal hernia    History of prediabetes    Hypercholesterolemia    Internal hemorrhoids with complication 40/98/1191   OCT 2015 FLEX SIG/IH BANDING     Intertrochanteric fracture of femur ([place])    5/16 - 03/26/2021 fracture sustained in a mechanical fall.  IM nailing performed 5/17 by Dr Larena Glassman Warfarin held because of posttraumatic pelvic hematoma.  Postop DVT with 81 mg aspirin twice daily x28 days.   Major depressive disorder, recurrent, moderate ([place]) 12/09/2020   Migraine    "used to have them right bad; I don't now" (04/11/2015)   MITRAL REGURGITATION 04/27/2010   Qualifier: Diagnosis of  By: [name], MD, [name]    Osteoporosis    Paroxysmal atrial fibrillation [facility])    Pelvic hematoma, female 03/27/2021   Sustained in mechanical fall.  Warfarin held.  IM nailing performed 5/17 with postop DVT prophylaxis 81 mg twice daily x28 days.   PONV (postoperative nausea and vomiting)    Vertigo     Past Surgical History:  Procedure Laterality Date   BRAVO [facility] STUDY  11/15/2012   Procedure: BRAVO [place];  Surgeon: [name], MD;  [facility];  Service: Endoscopy;;   CARDIOVERSION N/A 05/16/2020   Procedure: CARDIOVERSION;  Surgeon:  Donato Heinz, MD;  Location: [facility];  Service: Cardiovascular;  Laterality: N/A;   CATARACT EXTRACTION W/ INTRAOCULAR LENS  IMPLANT, BILATERAL Bilateral    COLONOSCOPY  2008   Dr. [name]: internal hemorrhoids    DILATION AND CURETTAGE OF UTERUS     ESOPHAGEAL DILATION N/A 06/16/2021   Procedure: ESOPHAGEAL DILATION;  Surgeon: Harvel Quale, MD;  [facility];  Service: Gastroenterology;  Laterality: N/A;   ESOPHAGOGASTRODUODENOSCOPY (EGD) WITH ESOPHAGEAL DILATION  2001   Dr. [name]: erosive esophagitis, esophageal stricture, duodenitis, s/p Savary dilation   ESOPHAGOGASTRODUODENOSCOPY (EGD) WITH ESOPHAGEAL DILATION  11/15/2012   UMP:[id number] web was found & MOST LIKELY CAUSE FOR DYAPHAGIA/Polyp was found in the gastric body and gastric fundus/ gastritis on bx   ESOPHAGOGASTRODUODENOSCOPY (EGD) WITH PROPOFOL N/A 06/16/2021   Procedure: ESOPHAGOGASTRODUODENOSCOPY (EGD) WITH PROPOFOL;  Surgeon: Harvel Quale, MD;  [facility];  Service: Gastroenterology;  Laterality: N/A;  8:15   EYE SURGERY Bilateral    "laser OR after cataract OR; cause I couldn't see"   FLEXIBLE SIGMOIDOSCOPY N/A 08/22/2014   mild diverticulosis in sigmoid, moderate sized Grade 3 hemorrhoids s/p banding X 3.    FRACTURE SURGERY Right    below the knee - 2 bones broke has plates   HEMORRHOID BANDING N/A 08/22/2014   Procedure: HEMORRHOID BANDING;  Surgeon: [name], MD;  [facility];  Service: Endoscopy;  Laterality: N/A;   HEMORRHOID SURGERY N/A 03/30/2018   Procedure: EXTENSIVE HEMORRHOIDECTOMY;  Surgeon: [name], MD;  Location: [facility];  Service: General;  Laterality: N/A;   INTRAMEDULLARY (IM) NAIL INTERTROCHANTERIC Right 03/24/2021   Procedure: INTRAMEDULLARY (IM) NAIL INTERTROCHANTRIC;  Surgeon: Mordecai Rasmussen, MD;  Location: [facility];  Service: Orthopedics;  Laterality: Right;   OPEN REDUCTION INTERNAL FIXATION (ORIF) TIBIA/FIBULA  FRACTURE Right 2013   broke tibia and fibula after falling down stairs   PROLAPSED UTERINE FIBROID LIGATION  2015   TOTAL ABDOMINAL HYSTERECTOMY      Social History   Socioeconomic History   Marital status: Married    Spouse name: george    Number of children: 1   Years of education: Not on file   Highest education level: Not on file  Occupational History   Occupation: Retired    Comment: Textile  Tobacco Use   Smoking status: Never   Smokeless tobacco: Never  Scientific laboratory technician Use: Never used  Substance and Sexual Activity   Alcohol use: No    Alcohol/week: 0.0 standard drinks of alcohol   Drug use: No   Sexual activity: Not Currently    Birth control/protection: Surgical, Post-menopausal    Comment: hyst  Other Topics Concern   Not on file  Social History Narrative   Married   No regular exercise   Social Determinants of Health   Financial Resource Strain: Not on file  Food Insecurity: Not on file  Transportation Needs: Not on file  Physical Activity: Not on file  Stress: Not on file  Social Connections:  Not on file  Intimate Partner Violence: Not on file    Outpatient Encounter Medications as of 11/03/2022  Medication Sig   acetaminophen (TYLENOL) 650 MG CR tablet Take 650 mg by mouth every 6 (six) hours as needed for pain. Displaced intertrochanteric fracture of right femur   amLODipine (NORVASC) 5 MG tablet TAKE 1 TABLET (5 MG TOTAL) BY MOUTH DAILY.   ASPIRIN LOW DOSE 81 MG tablet TAKE 1 TABLET TWICE DAILY   bisacodyl (DULCOLAX) 10 MG suppository Place 1 suppository (10 mg total) rectally as needed for moderate constipation.   diclofenac Sodium (VOLTAREN) 1 % GEL Apply 2 g topically 4 (four) times daily.   ferrous sulfate 325 (65 FE) MG EC tablet Take 1 tablet by mouth daily.   metoprolol tartrate (LOPRESSOR) 50 MG tablet Take 1 tablet (50 mg total) by mouth 2 (two) times daily.   omeprazole (PRILOSEC) 20 MG capsule Take 1 capsule (20 mg total) by mouth  daily.   ondansetron (ZOFRAN-ODT) 4 MG disintegrating tablet Take 1 tablet (4 mg total) by mouth every 8 (eight) hours as needed for nausea or vomiting.   sennosides-docusate sodium (SENOKOT-S) 8.6-50 MG tablet Take 1 tablet by mouth at bedtime.   simvastatin (ZOCOR) 20 MG tablet TAKE 1 TABLET EVERY DAY   torsemide (DEMADEX) 20 MG tablet TAKE 1 TABLET TWICE DAILY   No facility-administered encounter medications on file as of 11/03/2022.    Allergies  Allergen Reactions   Codeine Nausea And Vomiting   Doxycycline Other (See Comments)    Chest congestion   Triamterene-Hctz Other (See Comments)    weakness   Atorvastatin Other (See Comments)    Myalgias    Crestor [Rosuvastatin Calcium] Other (See Comments)    weakness   Morphine Nausea Only   Risedronate Sodium Other (See Comments)    ACTONEL - reflux    Review of Systems  Constitutional:  Positive for activity change and appetite change. Negative for chills, diaphoresis, fatigue, fever and unexpected weight change.  Eyes:  Negative for photophobia and visual disturbance.  Respiratory:  Positive for shortness of breath (with exertion at times). Negative for cough.   Cardiovascular:  Negative for chest pain, palpitations and leg swelling.  Gastrointestinal:  Negative for abdominal distention, abdominal pain, anal bleeding, blood in stool, constipation, diarrhea, nausea, rectal pain and vomiting.  Genitourinary:  Negative for decreased urine volume and difficulty urinating.  Musculoskeletal:  Positive for arthralgias, back pain and gait problem. Negative for joint swelling, myalgias, neck pain and neck stiffness.  Neurological:  Negative for dizziness, tremors, seizures, syncope, facial asymmetry, speech difficulty, weakness, light-headedness, numbness and headaches.  Psychiatric/Behavioral:  Negative for confusion.   All other systems reviewed and are negative.       Objective:  BP 136/78   Pulse 84   Temp (!) 97.5 F (36.4  C) (Temporal)   Ht _0  (1.549 m)   Wt 104 lb 12.8 oz (47.5 kg)   SpO2 94%   BMI 19.80 kg/m    Wt Readings from Last 3 Encounters:  11/03/22 104 lb 12.8 oz (47.5 kg)  09/01/22 100 lb (45.4 kg)  07/14/22 95 lb (43.1 kg)    Physical Exam Vitals and nursing note reviewed.  Constitutional:      General: She is not in acute distress.    Appearance: She is not toxic-appearing or diaphoretic.     Comments: Frail elderly  HENT:     Head: Normocephalic and atraumatic.     Mouth/Throat:  Mouth: Mucous membranes are moist.  Eyes:     Pupils: Pupils are equal, round, and reactive to light.  Neck:     Vascular: JVD (with Lancisi sign bilaterally) present.  Cardiovascular:     Rate and Rhythm: Normal rate. Rhythm irregularly irregular.     Heart sounds: Murmur heard.     Systolic murmur is present with a grade of 3/6.     Comments: Holosystolic murmur 3/6, loudest at 3rd left intercostal space, Lancisi Sign present bilaterally.  Pulmonary:     Effort: Pulmonary effort is normal.     Breath sounds: Normal breath sounds.  Abdominal:     General: Bowel sounds are normal.     Palpations: Abdomen is soft.  Musculoskeletal:     Right lower leg: No edema.     Left lower leg: No edema.     Comments: Kyphosis  Skin:    General: Skin is warm and dry.     Capillary Refill: Capillary refill takes less than 2 seconds.  Neurological:     General: No focal deficit present.     Mental Status: She is alert and oriented to person, place, and time.     Gait: Gait abnormal (using walker).     Results for orders placed or performed in visit on 07/14/22  CMP14+EGFR  Result Value Ref Range   Glucose 78 70 - 99 mg/dL   BUN 31 (H) 8 - 27 mg/dL   Creatinine, Ser 1.11 (H) 0.57 - 1.00 mg/dL   eGFR 49 (L) >59 mL/min/1.73   BUN/Creatinine Ratio 28 12 - 28   Sodium 143 134 - 144 mmol/L   Potassium 4.4 3.5 - 5.2 mmol/L   Chloride 99 96 - 106 mmol/L   CO2 26 20 - 29 mmol/L   Calcium 9.5 8.7 -  10.3 mg/dL   Total Protein 6.9 6.0 - 8.5 g/dL   Albumin 4.4 3.7 - 4.7 g/dL   Globulin, Total 2.5 1.5 - 4.5 g/dL   Albumin/Globulin Ratio 1.8 1.2 - 2.2   Bilirubin Total 0.6 0.0 - 1.2 mg/dL   Alkaline Phosphatase 137 (H) 44 - 121 IU/L   AST 20 0 - 40 IU/L   ALT 10 0 - 32 IU/L       Pertinent labs & imaging results that were available during my care of the patient were reviewed by me and considered in my medical decision making.  Assessment & Plan:  Kanon was seen today for establish care and medical management of chronic issues.  Diagnoses and all orders for this visit:  Paroxysmal atrial fibrillation ([place]) Rate controlled, not on anticoagulation due to recurrent falls. Followed by cardiology on a regular basis.  -     Lipid panel -     Thyroid Panel With TSH -     Anemia Profile B  Aortic atherosclerosis ([facility]) No chest pain. On ASA and statin therapy.  -     CMP14+EGFR -     Lipid panel -     Thyroid Panel With TSH -     Anemia Profile B  Severe tricuspid valve regurgitation Followed by cardiology. Pt states no plans for treatment and that she does not want treatment at this time.   Mixed hyperlipidemia Diet encouraged - increase intake of fresh fruits and vegetables, increase intake of lean proteins. Bake, broil, or grill foods. Avoid fried, greasy, and fatty foods. Avoid fast foods. Increase intake of fiber-rich whole grains. Exercise encouraged - at least 150 minutes  per week and advance as tolerated. Goal BMI < 25. Continue medications as prescribed. Follow up in 3-6 months as discussed.   -     CMP14+EGFR -     Lipid panel  Chronic kidney disease ([place]) Labs pending. Will discuss ACEi or ARB therapy if worsening.  -     CMP14+EGFR -     VITAMIN D 25 Hydroxy (Vit-D Deficiency, Fractures) -     Anemia Profile B  Iron deficiency On repletion therapy. Will recheck labs today.  -     Anemia Profile B  Hypokalemia Will recheck labs today.  -     CMP14+EGFR  Primary  hypertension BP well controlled. Changes were not made in regimen today. Goal BP is 130/80. Pt aware to report any persistent high or low readings. DASH diet and exercise encouraged. Exercise at least 150 minutes per week and increase as tolerated. Goal BMI > 25. Stress management encouraged. Avoid nicotine and tobacco product use. Avoid excessive alcohol and NSAID's. Avoid more than 2000 mg of sodium daily. Medications as prescribed. Follow up as scheduled.  -     CMP14+EGFR -     Lipid panel -     Thyroid Panel With TSH -     Anemia Profile B     Continue all other maintenance medications.  Follow up plan: Return in about 3 months (around 02/02/2023).   Continue healthy lifestyle choices, including diet (rich in fruits, vegetables, and lean proteins, and low in salt and simple carbohydrates) and exercise (at least 30 minutes of moderate physical activity daily).  Educational handout given for health maintenance   The above assessment and management plan was discussed with the patient. The patient verbalized understanding of and has agreed to the management plan. Patient is aware to call the clinic if they develop any new symptoms or if symptoms persist or worsen. Patient is aware when to return to the clinic for a follow-up visit. Patient educated on when it is appropriate to go to the emergency department.   Monia Pouch, FNP-C [facility] [phone]

## 2022-11-04 LAB — ANEMIA PROFILE B
Basophils Absolute: 0.1 10*3/uL (ref 0.0–0.2)
Basos: 1 %
EOS (ABSOLUTE): 0.1 10*3/uL (ref 0.0–0.4)
Eos: 2 %
Ferritin: 306 ng/mL — ABNORMAL HIGH (ref 15–150)
Folate: 13.4 ng/mL (ref >3.0)
Hematocrit: 44.9 % (ref 34.0–46.6)
Hemoglobin: 15.1 g/dL (ref 11.1–15.9)
Immature Grans (Abs): 0 10*3/uL (ref 0.0–0.1)
Immature Granulocytes: 0 %
Iron Saturation: 25 % (ref 15–55)
Iron: 78 ug/dL (ref 27–139)
Lymphocytes Absolute: 1.5 10*3/uL (ref 0.7–3.1)
Lymphs: 22 %
MCH: 33.4 pg — ABNORMAL HIGH (ref 26.6–33.0)
MCHC: 33.6 g/dL (ref 31.5–35.7)
MCV: 99 fL — ABNORMAL HIGH (ref 79–97)
Monocytes Absolute: 0.6 10*3/uL (ref 0.1–0.9)
Monocytes: 9 %
Neutrophils Absolute: 4.7 10*3/uL (ref 1.4–7.0)
Neutrophils: 66 %
Platelets: 145 10*3/uL — ABNORMAL LOW (ref 150–450)
RBC: 4.52 x10E6/uL (ref 3.77–5.28)
RDW: 12.5 % (ref 11.7–15.4)
Retic Ct Pct: 1.7 % (ref 0.6–2.6)
Total Iron Binding Capacity: 314 ug/dL (ref 250–450)
UIBC: 236 ug/dL (ref 118–369)
Vitamin B-12: 473 pg/mL (ref 232–1245)
WBC: 7 10*3/uL (ref 3.4–10.8)

## 2022-11-04 LAB — LIPID PANEL
Chol/HDL Ratio: 2.7 ratio (ref 0.0–4.4)
Cholesterol, Total: 150 mg/dL (ref 100–199)
HDL: 55 mg/dL (ref >39)
LDL Chol Calc (NIH): 70 mg/dL (ref 0–99)
Triglycerides: 144 mg/dL (ref 0–149)
VLDL Cholesterol Cal: 25 mg/dL (ref 5–40)

## 2022-11-04 LAB — CMP14+EGFR
ALT: 12 IU/L (ref 0–32)
AST: 20 IU/L (ref 0–40)
Albumin/Globulin Ratio: 1.6 (ref 1.2–2.2)
Albumin: 4.4 g/dL (ref 3.7–4.7)
Alkaline Phosphatase: 143 IU/L — ABNORMAL HIGH (ref 44–121)
BUN/Creatinine Ratio: 32 — ABNORMAL HIGH (ref 12–28)
BUN: 33 mg/dL — ABNORMAL HIGH (ref 8–27)
Bilirubin Total: 0.4 mg/dL (ref 0.0–1.2)
CO2: 27 mmol/L (ref 20–29)
Calcium: 8.8 mg/dL (ref 8.7–10.3)
Chloride: 98 mmol/L (ref 96–106)
Creatinine, Ser: 1.02 mg/dL — ABNORMAL HIGH (ref 0.57–1.00)
Globulin, Total: 2.7 g/dL (ref 1.5–4.5)
Glucose: 94 mg/dL (ref 70–99)
Potassium: 4.3 mmol/L (ref 3.5–5.2)
Sodium: 141 mmol/L (ref 134–144)
Total Protein: 7.1 g/dL (ref 6.0–8.5)
eGFR: 55 mL/min/{1.73_m2} — ABNORMAL LOW (ref >59)

## 2022-11-04 LAB — THYROID PANEL WITH TSH
Free Thyroxine Index: 2 (ref 1.2–4.9)
T3 Uptake Ratio: 27 % (ref 24–39)
T4, Total: 7.4 ug/dL (ref 4.5–12.0)
TSH: 1.57 u[IU]/mL (ref 0.450–4.500)

## 2022-11-04 LAB — VITAMIN D 25 HYDROXY (VIT D DEFICIENCY, FRACTURES): Vit D, 25-Hydroxy: 26 ng/mL — ABNORMAL LOW (ref 30.0–100.0)

## 2022-12-02 DIAGNOSIS — H5213 Myopia, bilateral: Secondary | ICD-10-CM | POA: Diagnosis not present

## 2022-12-02 DIAGNOSIS — H52223 Regular astigmatism, bilateral: Secondary | ICD-10-CM | POA: Diagnosis not present

## 2022-12-02 DIAGNOSIS — H524 Presbyopia: Secondary | ICD-10-CM | POA: Diagnosis not present

## 2022-12-02 DIAGNOSIS — Z961 Presence of intraocular lens: Secondary | ICD-10-CM | POA: Diagnosis not present

## 2022-12-06 ENCOUNTER — Other Ambulatory Visit: Payer: Self-pay | Admitting: Cardiology

## 2022-12-08 ENCOUNTER — Other Ambulatory Visit: Payer: Self-pay | Admitting: Family Medicine

## 2022-12-08 DIAGNOSIS — I5032 Chronic diastolic (congestive) heart failure: Secondary | ICD-10-CM

## 2022-12-16 ENCOUNTER — Encounter (HOSPITAL_COMMUNITY): Payer: Self-pay | Admitting: *Deleted

## 2022-12-24 ENCOUNTER — Other Ambulatory Visit: Payer: Self-pay | Admitting: Family Medicine

## 2023-02-03 ENCOUNTER — Ambulatory Visit (INDEPENDENT_AMBULATORY_CARE_PROVIDER_SITE_OTHER): Payer: Medicare HMO | Admitting: Family Medicine

## 2023-02-03 ENCOUNTER — Encounter: Payer: Self-pay | Admitting: Family Medicine

## 2023-02-03 VITALS — BP 131/87 | HR 81 | Temp 97.6°F | Ht 61.0 in | Wt 108.6 lb

## 2023-02-03 DIAGNOSIS — I480 Paroxysmal atrial fibrillation: Secondary | ICD-10-CM

## 2023-02-03 DIAGNOSIS — I10 Essential (primary) hypertension: Secondary | ICD-10-CM | POA: Diagnosis not present

## 2023-02-03 DIAGNOSIS — I5032 Chronic diastolic (congestive) heart failure: Secondary | ICD-10-CM | POA: Diagnosis not present

## 2023-02-03 DIAGNOSIS — I130 Hypertensive heart and chronic kidney disease with heart failure and stage 1 through stage 4 chronic kidney disease, or unspecified chronic kidney disease: Secondary | ICD-10-CM

## 2023-02-03 DIAGNOSIS — N1831 Chronic kidney disease, stage 3a: Secondary | ICD-10-CM

## 2023-02-03 DIAGNOSIS — M810 Age-related osteoporosis without current pathological fracture: Secondary | ICD-10-CM

## 2023-02-03 DIAGNOSIS — R6889 Other general symptoms and signs: Secondary | ICD-10-CM | POA: Diagnosis not present

## 2023-02-03 DIAGNOSIS — E782 Mixed hyperlipidemia: Secondary | ICD-10-CM

## 2023-02-03 DIAGNOSIS — I5082 Biventricular heart failure: Secondary | ICD-10-CM

## 2023-02-03 LAB — LIPID PANEL

## 2023-02-03 MED ORDER — TORSEMIDE 20 MG PO TABS: 20.0000 mg | ORAL_TABLET | Freq: Two times a day (BID) | ORAL | 1 refills | Status: DC

## 2023-02-03 NOTE — Progress Notes (Signed)
Subjective:  Patient ID: Norma Barajas, female    DOB: [birth year], 82 y.o.   MRN: [id number]  Patient Care Team: Baruch Gouty, FNP as PCP - General (Family Medicine) Minus Breeding, MD as PCP - Cardiology (Cardiology) [name], MD (Inactive) as Attending Physician (Gastroenterology) Melina Schools, OD as Consulting Physician (Optometry) Sherran Needs, NP as Consulting Physician (Cardiology)   Chief Complaint:  Medical Management of Chronic Issues (3 month follow up )   HPI: Norma Barajas is a 82 y.o. female presenting on 02/03/2023 for Medical Management of Chronic Issues (3 month follow up )   1. 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 No leg swelling, shortness of breath, orthopnea, or PND.   2. Stage 3a chronic kidney disease ([place]) Denies changes in urinary output. No weakness, confusion, or fatigue. Not on ACEi or ARB, discussed possible need for adding to regimen if renal function worsens.   3. Mixed hyperlipidemia On statin therapy. Does not exercise on a regular basis. Does try to watch her diet.   4. Primary hypertension On norvasc, lopressor, and torsemide. Denies chest pain, shortness of breath, headaches, weakness, confusion, leg swelling, or visual changes.   5. Paroxysmal atrial fibrillation ([place]) Followed by cardiology on a regular basis. Not on anticoagulation therapy.  6. Age-related osteoporosis without current pathological fracture No recent fractures. Does take calcium with Vit D. Will need repeat DEXA at next visit, pt states she will think about completing this.     Relevant past medical, surgical, family, and social history reviewed and updated as indicated.  Allergies and medications reviewed and updated. Data reviewed: Chart in Epic.   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 Nonobstructive minimal CAD 10/2005.   Depression    Diastolic dysfunction    Grade 1. Ejection fraction 60-65%.   Dysrhythmia    a fib   Erosive esophagitis    Essential hypertension    Fracture of ramus of right pubis with routine healing 12/09/2020   GERD (gastroesophageal reflux disease)    Grade III hemorrhoids    History of hiatal hernia    History of prediabetes    Hypercholesterolemia    Internal hemorrhoids with complication [id number]   OCT 2015 FLEX SIG/IH BANDING     Intertrochanteric fracture of femur ([place])    5/16 - 03/26/2021 fracture sustained in a mechanical fall.  IM nailing performed 5/17 by Dr Larena Glassman Warfarin held because of posttraumatic pelvic hematoma.  Postop DVT with 81 mg aspirin twice daily x28 days.   Major depressive disorder, recurrent, moderate ([place]) 12/09/2020   Migraine    "used to have them right bad; I don't now" (04/11/2015)   MITRAL REGURGITATION 04/27/2010   Qualifier: Diagnosis of  By: [name], MD, [name]    Osteoporosis    Paroxysmal atrial fibrillation [facility])    Pelvic hematoma, female 03/27/2021   Sustained in mechanical fall.  Warfarin held.  IM nailing performed 5/17 with postop DVT prophylaxis 81 mg twice daily x28 days.   PONV (postoperative nausea and vomiting)    Vertigo     Past Surgical History:  Procedure Laterality Date  BRAVO [place] STUDY  11/15/2012   Procedure: BRAVO [place];  Surgeon: [name], MD;  [facility];  Service: Endoscopy;;   CARDIOVERSION N/A 05/16/2020   Procedure: CARDIOVERSION;  Surgeon: Donato Heinz, MD;  Location: [place];  Service: Cardiovascular;  Laterality: N/A;   CATARACT EXTRACTION W/ INTRAOCULAR LENS  IMPLANT, BILATERAL Bilateral    COLONOSCOPY  2008   Dr. [name]: internal hemorrhoids    DILATION AND CURETTAGE OF UTERUS      ESOPHAGEAL DILATION N/A 06/16/2021   Procedure: ESOPHAGEAL DILATION;  Surgeon: Harvel Quale, MD;  [facility];  Service: Gastroenterology;  Laterality: N/A;   ESOPHAGOGASTRODUODENOSCOPY (EGD) WITH ESOPHAGEAL DILATION  2001   Dr. [name]: erosive esophagitis, esophageal stricture, duodenitis, s/p Savary dilation   ESOPHAGOGASTRODUODENOSCOPY (EGD) WITH ESOPHAGEAL DILATION  11/15/2012   [id number] web was found & MOST LIKELY CAUSE FOR DYAPHAGIA/Polyp was found in the gastric body and gastric fundus/ gastritis on bx   ESOPHAGOGASTRODUODENOSCOPY (EGD) WITH PROPOFOL N/A 06/16/2021   Procedure: ESOPHAGOGASTRODUODENOSCOPY (EGD) WITH PROPOFOL;  Surgeon: Harvel Quale, MD;  [facility];  Service: Gastroenterology;  Laterality: N/A;  8:15   EYE SURGERY Bilateral    "laser OR after cataract OR; cause I couldn't see"   FLEXIBLE SIGMOIDOSCOPY N/A 08/22/2014   mild diverticulosis in sigmoid, moderate sized Grade 3 hemorrhoids s/p banding X 3.    FRACTURE SURGERY Right    below the knee - 2 bones broke has plates   HEMORRHOID BANDING N/A 08/22/2014   Procedure: HEMORRHOID BANDING;  Surgeon: [name], MD;  [facility];  Service: Endoscopy;  Laterality: N/A;   HEMORRHOID SURGERY N/A 03/30/2018   Procedure: EXTENSIVE HEMORRHOIDECTOMY;  Surgeon: [name], MD;  Location: [facility];  Service: General;  Laterality: N/A;   INTRAMEDULLARY (IM) NAIL INTERTROCHANTERIC Right 03/24/2021   Procedure: INTRAMEDULLARY (IM) NAIL INTERTROCHANTRIC;  Surgeon: Mordecai Rasmussen, MD;  Location: [facility];  Service: Orthopedics;  Laterality: Right;   OPEN REDUCTION INTERNAL FIXATION (ORIF) TIBIA/FIBULA FRACTURE Right 2013   broke tibia and fibula after falling down stairs   PROLAPSED UTERINE FIBROID LIGATION  2015   TOTAL ABDOMINAL HYSTERECTOMY      Social History   Socioeconomic History   Marital status: Widowed    Spouse name: george    Number of children:  1   Years of education: Not on file   Highest education level: Not on file  Occupational History   Occupation: Retired    Comment: Textile  Tobacco Use   Smoking status: Never   Smokeless tobacco: Never  Vaping Use   Vaping Use: Never used  Substance and Sexual Activity   Alcohol use: No    Alcohol/week: 0.0 standard drinks of alcohol   Drug use: No   Sexual activity: Not Currently    Birth control/protection: Surgical, Post-menopausal    Comment: hyst  Other Topics Concern   Not on file  Social History Narrative   Married   No regular exercise   Social Determinants of Health   Financial Resource Strain: Not on file  Food Insecurity: Not on file  Transportation Needs: Not on file  Physical Activity: Not on file  Stress: Not on file  Social Connections: Not on file  Intimate Partner Violence: Not on file    Outpatient Encounter Medications as of 02/03/2023  Medication Sig   acetaminophen (TYLENOL) 650 MG CR tablet Take 650 mg by mouth every 6 (six) hours as needed  for pain. Displaced intertrochanteric fracture of right femur   amLODipine (NORVASC) 5 MG tablet TAKE 1 TABLET EVERY DAY   ASPIRIN LOW DOSE 81 MG tablet TAKE 1 TABLET TWICE DAILY   bisacodyl (DULCOLAX) 10 MG suppository Place 1 suppository (10 mg total) rectally as needed for moderate constipation.   diclofenac Sodium (VOLTAREN) 1 % GEL Apply 2 g topically 4 (four) times daily.   ferrous sulfate 325 (65 FE) MG EC tablet Take 1 tablet by mouth daily.   metoprolol tartrate (LOPRESSOR) 50 MG tablet Take 1 tablet (50 mg total) by mouth 2 (two) times daily.   omeprazole (PRILOSEC) 20 MG capsule Take 1 capsule (20 mg total) by mouth daily.   sennosides-docusate sodium (SENOKOT-S) 8.6-50 MG tablet Take 1 tablet by mouth at bedtime.   simvastatin (ZOCOR) 20 MG tablet TAKE 1 TABLET EVERY DAY   [DISCONTINUED] torsemide (DEMADEX) 20 MG tablet TAKE 1 TABLET TWICE DAILY   torsemide (DEMADEX) 20 MG tablet Take 1 tablet (20  mg total) by mouth 2 (two) times daily.   [DISCONTINUED] ondansetron (ZOFRAN-ODT) 4 MG disintegrating tablet Take 1 tablet (4 mg total) by mouth every 8 (eight) hours as needed for nausea or vomiting.   No facility-administered encounter medications on file as of 02/03/2023.    Allergies  Allergen Reactions   Codeine Nausea And Vomiting   Doxycycline Other (See Comments)    Chest congestion   Triamterene-Hctz Other (See Comments)    weakness   Atorvastatin Other (See Comments)    Myalgias    Crestor [Rosuvastatin Calcium] Other (See Comments)    weakness   Morphine Nausea Only   Risedronate Sodium Other (See Comments)    ACTONEL - reflux    Review of Systems  Constitutional:  Negative for activity change, appetite change, chills, diaphoresis, fatigue, fever and unexpected weight change.  HENT: Negative.    Eyes: Negative.  Negative for photophobia and visual disturbance.  Respiratory:  Negative for cough, chest tightness and shortness of breath.   Cardiovascular:  Negative for chest pain, palpitations and leg swelling.  Gastrointestinal:  Negative for abdominal pain, blood in stool, constipation, diarrhea, nausea and vomiting.  Endocrine: Negative.  Negative for polydipsia, polyphagia and polyuria.  Genitourinary:  Negative for decreased urine volume, difficulty urinating, dysuria, frequency and urgency.  Musculoskeletal:  Negative for arthralgias and myalgias.  Skin: Negative.   Allergic/Immunologic: Negative.   Neurological:  Negative for dizziness, tremors, seizures, syncope, facial asymmetry, speech difficulty, weakness, light-headedness, numbness and headaches.  Hematological: Negative.   Psychiatric/Behavioral:  Negative for confusion, hallucinations, sleep disturbance and suicidal ideas.   All other systems reviewed and are negative.       Objective:  BP 131/87   Pulse 81   Temp 97.6 F (36.4 C) (Temporal)   Ht 5\' 1"  (1.549 m)   Wt 108 lb 9.6 oz (49.3 kg)    SpO2 97%   BMI 20.52 kg/m    Wt Readings from Last 3 Encounters:  02/03/23 108 lb 9.6 oz (49.3 kg)  11/03/22 104 lb 12.8 oz (47.5 kg)  09/01/22 100 lb (45.4 kg)    Physical Exam Vitals and nursing note reviewed.  Constitutional:      General: She is not in acute distress.    Appearance: Normal appearance. She is well-developed, well-groomed and normal weight. She is not ill-appearing, toxic-appearing or diaphoretic.  HENT:     Head: Normocephalic and atraumatic.     Jaw: There is normal jaw occlusion.  Right Ear: Hearing normal.     Left Ear: Hearing normal.     Nose: Nose normal.     Mouth/Throat:     Lips: Pink.     Mouth: Mucous membranes are moist.     Pharynx: Oropharynx is clear. Uvula midline.  Eyes:     General: Lids are normal.     Extraocular Movements: Extraocular movements intact.     Conjunctiva/sclera: Conjunctivae normal.     Pupils: Pupils are equal, round, and reactive to light.  Neck:     Thyroid: No thyroid mass, thyromegaly or thyroid tenderness.     Vascular: Carotid bruit and JVD ((with Lancisi sign bilaterally)) present.     Trachea: Trachea and phonation normal.  Cardiovascular:     Rate and Rhythm: Normal rate. Rhythm irregularly irregular.     Chest Wall: PMI is not displaced.     Pulses: Normal pulses.     Heart sounds: Murmur heard.     Systolic murmur is present with a grade of 3/6.     No friction rub. No gallop.  Pulmonary:     Effort: Pulmonary effort is normal. No respiratory distress.     Breath sounds: Normal breath sounds. No wheezing.  Abdominal:     General: There is no abdominal bruit.     Palpations: There is no hepatomegaly or splenomegaly.  Musculoskeletal:     Cervical back: Normal range of motion and neck supple.     Right lower leg: No edema.     Left lower leg: No edema.     Comments: Kyphosis  Lymphadenopathy:     Cervical: No cervical adenopathy.  Skin:    General: Skin is warm and dry.     Capillary Refill:  Capillary refill takes less than 2 seconds.     Coloration: Skin is not cyanotic, jaundiced or pale.     Findings: No rash.  Neurological:     General: No focal deficit present.     Mental Status: She is alert and oriented to person, place, and time.     Sensory: Sensation is intact.     Motor: Motor function is intact.     Coordination: Coordination is intact.     Gait: Gait abnormal (using rolling walker).     Deep Tendon Reflexes: Reflexes are normal and symmetric.  Psychiatric:        Attention and Perception: Attention and perception normal.        Mood and Affect: Mood and affect normal.        Speech: Speech normal.        Behavior: Behavior normal. Behavior is cooperative.        Thought Content: Thought content normal.        Cognition and Memory: Cognition and memory normal.        Judgment: Judgment normal.     Results for orders placed or performed in visit on 11/03/22  CMP14+EGFR  Result Value Ref Range   Glucose 94 70 - 99 mg/dL   BUN 33 (H) 8 - 27 mg/dL   Creatinine, Ser 1.02 (H) 0.57 - 1.00 mg/dL   eGFR 55 (L) >59 mL/min/1.73   BUN/Creatinine Ratio 32 (H) 12 - 28   Sodium 141 134 - 144 mmol/L   Potassium 4.3 3.5 - 5.2 mmol/L   Chloride 98 96 - 106 mmol/L   CO2 27 20 - 29 mmol/L   Calcium 8.8 8.7 - 10.3 mg/dL   Total Protein 7.1 6.0 -  8.5 g/dL   Albumin 4.4 3.7 - 4.7 g/dL   Globulin, Total 2.7 1.5 - 4.5 g/dL   Albumin/Globulin Ratio 1.6 1.2 - 2.2   Bilirubin Total 0.4 0.0 - 1.2 mg/dL   Alkaline Phosphatase 143 (H) 44 - 121 IU/L   AST 20 0 - 40 IU/L   ALT 12 0 - 32 IU/L  Lipid panel  Result Value Ref Range   Cholesterol, Total 150 100 - 199 mg/dL   Triglycerides 144 0 - 149 mg/dL   HDL 55 >39 mg/dL   VLDL Cholesterol Cal 25 5 - 40 mg/dL   LDL Chol Calc (NIH) 70 0 - 99 mg/dL   Chol/HDL Ratio 2.7 0.0 - 4.4 ratio  Thyroid Panel With TSH  Result Value Ref Range   TSH 1.570 0.450 - 4.500 uIU/mL   T4, Total 7.4 4.5 - 12.0 ug/dL   T3 Uptake Ratio 27 24 -  39 %   Free Thyroxine Index 2.0 1.2 - 4.9  VITAMIN D 25 Hydroxy (Vit-D Deficiency, Fractures)  Result Value Ref Range   Vit D, 25-Hydroxy 26.0 (L) 30.0 - 100.0 ng/mL  Anemia Profile B  Result Value Ref Range   Total Iron Binding Capacity 314 250 - 450 ug/dL   UIBC 236 118 - 369 ug/dL   Iron 78 27 - 139 ug/dL   Iron Saturation 25 15 - 55 %   Ferritin 306 (H) 15 - 150 ng/mL   Vitamin B-12 473 232 - 1,245 pg/mL   Folate 13.4 >3.0 ng/mL   WBC 7.0 3.4 - 10.8 x10E3/uL   RBC 4.52 3.77 - 5.28 x10E6/uL   Hemoglobin 15.1 11.1 - 15.9 g/dL   Hematocrit 44.9 34.0 - 46.6 %   MCV 99 (H) 79 - 97 fL   MCH 33.4 (H) 26.6 - 33.0 pg   MCHC 33.6 31.5 - 35.7 g/dL   RDW 12.5 11.7 - 15.4 %   Platelets 145 (L) 150 - 450 x10E3/uL   Neutrophils 66 Not Estab. %   Lymphs 22 Not Estab. %   Monocytes 9 Not Estab. %   Eos 2 Not Estab. %   Basos 1 Not Estab. %   Neutrophils Absolute 4.7 1.4 - 7.0 x10E3/uL   Lymphocytes Absolute 1.5 0.7 - 3.1 x10E3/uL   Monocytes Absolute 0.6 0.1 - 0.9 x10E3/uL   EOS (ABSOLUTE) 0.1 0.0 - 0.4 x10E3/uL   Basophils Absolute 0.1 0.0 - 0.2 x10E3/uL   Immature Granulocytes 0 Not Estab. %   Immature Grans (Abs) 0.0 0.0 - 0.1 x10E3/uL   Retic Ct Pct 1.7 0.6 - 2.6 %       Pertinent labs & imaging results that were available during my care of the patient were reviewed by me and considered in my medical decision making.  Assessment & Plan:  Ashleynicole was seen today for medical management of chronic issues.  Diagnoses and all orders for this visit:  Stage 3a chronic kidney disease ([place]) Discussed need for ACEi or ARB therapy. Pt states if renal function has declined, she agrees to start medications. Labs pending.  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 Will recheck CMP and refill demadex.  -     torsemide  (DEMADEX) 20 MG tablet; Take 1 tablet (20 mg total) by mouth 2 (two) times daily. -  CMP14+EGFR  Mixed hyperlipidemia Diet encouraged - increase intake of fresh fruits and vegetables, increase intake of lean proteins. Bake, broil, or grill foods. Avoid fried, greasy, and fatty foods. Avoid fast foods. Increase intake of fiber-rich whole grains. Exercise encouraged - at least 150 minutes per week and advance as tolerated.  Goal BMI < 25. Continue medications as prescribed. Follow up in 3-6 months as discussed.  -     CMP14+EGFR -     Lipid panel  Primary hypertension BP well controlled. Changes were not made in regimen today. Goal BP is 130/80. Pt aware to report any persistent high or low readings. DASH diet and exercise encouraged. Exercise at least 150 minutes per week and increase as tolerated. Goal BMI > 25. Stress management encouraged. Avoid nicotine and tobacco product use. Avoid excessive alcohol and NSAID's. Avoid more than 2000 mg of sodium daily. Medications as prescribed. Follow up as scheduled.  -     Anemia Profile B -     CMP14+EGFR -     Thyroid Panel With TSH  Paroxysmal atrial fibrillation ([place]) Followed by cardiology on a regular basis.  -     Anemia Profile B -     CMP14+EGFR -     Thyroid Panel With TSH -     Lipid panel  Age-related osteoporosis without current pathological fracture On calcium and vit D, needs DEXA at next visit.  -     CMP14+EGFR -     VITAMIN D 25 Hydroxy (Vit-D Deficiency, Fractures)     Continue all other maintenance medications.  Follow up plan: Return in 6 months (on 08/06/2023) for schedule AWV, chronic follow up.   Continue healthy lifestyle choices, including diet (rich in fruits, vegetables, and lean proteins, and low in salt and simple carbohydrates) and exercise (at least 30 minutes of moderate physical activity daily).  Educational handout given for health maintenance   The above assessment and management plan was discussed  with the patient. The patient verbalized understanding of and has agreed to the management plan. Patient is aware to call the clinic if they develop any new symptoms or if symptoms persist or worsen. Patient is aware when to return to the clinic for a follow-up visit. Patient educated on when it is appropriate to go to the emergency department.   Monia Pouch, FNP-C [facility] [phone]

## 2023-02-04 LAB — THYROID PANEL WITH TSH
Free Thyroxine Index: 1.8 (ref 1.2–4.9)
T3 Uptake Ratio: 26 % (ref 24–39)
T4, Total: 7 ug/dL (ref 4.5–12.0)
TSH: 1.29 u[IU]/mL (ref 0.450–4.500)

## 2023-02-04 LAB — CMP14+EGFR
ALT: 16 IU/L (ref 0–32)
AST: 21 IU/L (ref 0–40)
Albumin/Globulin Ratio: 1.7 (ref 1.2–2.2)
Albumin: 4.5 g/dL (ref 3.7–4.7)
Alkaline Phosphatase: 132 IU/L — ABNORMAL HIGH (ref 44–121)
BUN/Creatinine Ratio: 25 (ref 12–28)
BUN: 28 mg/dL — ABNORMAL HIGH (ref 8–27)
Bilirubin Total: 0.5 mg/dL (ref 0.0–1.2)
CO2: 28 mmol/L (ref 20–29)
Calcium: 9.4 mg/dL (ref 8.7–10.3)
Chloride: 98 mmol/L (ref 96–106)
Creatinine, Ser: 1.1 mg/dL — ABNORMAL HIGH (ref 0.57–1.00)
Globulin, Total: 2.6 g/dL (ref 1.5–4.5)
Glucose: 91 mg/dL (ref 70–99)
Potassium: 4 mmol/L (ref 3.5–5.2)
Sodium: 142 mmol/L (ref 134–144)
Total Protein: 7.1 g/dL (ref 6.0–8.5)
eGFR: 50 mL/min/{1.73_m2} — ABNORMAL LOW (ref >59)

## 2023-02-04 LAB — ANEMIA PROFILE B
Basophils Absolute: 0 10*3/uL (ref 0.0–0.2)
Basos: 1 %
EOS (ABSOLUTE): 0.1 10*3/uL (ref 0.0–0.4)
Eos: 2 %
Ferritin: 439 ng/mL — ABNORMAL HIGH (ref 15–150)
Folate: 17.8 ng/mL (ref >3.0)
Hematocrit: 45.7 % (ref 34.0–46.6)
Hemoglobin: 15.2 g/dL (ref 11.1–15.9)
Immature Grans (Abs): 0 10*3/uL (ref 0.0–0.1)
Immature Granulocytes: 0 %
Iron Saturation: 39 % (ref 15–55)
Iron: 123 ug/dL (ref 27–139)
Lymphocytes Absolute: 1.4 10*3/uL (ref 0.7–3.1)
Lymphs: 24 %
MCH: 32.1 pg (ref 26.6–33.0)
MCHC: 33.3 g/dL (ref 31.5–35.7)
MCV: 96 fL (ref 79–97)
Monocytes Absolute: 0.7 10*3/uL (ref 0.1–0.9)
Monocytes: 11 %
Neutrophils Absolute: 3.7 10*3/uL (ref 1.4–7.0)
Neutrophils: 62 %
Platelets: 147 10*3/uL — ABNORMAL LOW (ref 150–450)
RBC: 4.74 x10E6/uL (ref 3.77–5.28)
RDW: 12.6 % (ref 11.7–15.4)
Retic Ct Pct: 1.4 % (ref 0.6–2.6)
Total Iron Binding Capacity: 316 ug/dL (ref 250–450)
UIBC: 193 ug/dL (ref 118–369)
Vitamin B-12: 452 pg/mL (ref 232–1245)
WBC: 6 10*3/uL (ref 3.4–10.8)

## 2023-02-04 LAB — LIPID PANEL
Chol/HDL Ratio: 3.1 ratio (ref 0.0–4.4)
Cholesterol, Total: 176 mg/dL (ref 100–199)
HDL: 56 mg/dL (ref >39)
LDL Chol Calc (NIH): 88 mg/dL (ref 0–99)
Triglycerides: 187 mg/dL — ABNORMAL HIGH (ref 0–149)
VLDL Cholesterol Cal: 32 mg/dL (ref 5–40)

## 2023-02-04 LAB — VITAMIN D 25 HYDROXY (VIT D DEFICIENCY, FRACTURES): Vit D, 25-Hydroxy: 35.2 ng/mL (ref 30.0–100.0)

## 2023-02-15 ENCOUNTER — Telehealth: Payer: Self-pay | Admitting: Family Medicine

## 2023-02-15 NOTE — Telephone Encounter (Signed)
Patient aware to call cardiology with questions. Patient voiced understanding.

## 2023-02-27 ENCOUNTER — Other Ambulatory Visit: Payer: Self-pay | Admitting: Family Medicine

## 2023-02-27 DIAGNOSIS — K219 Gastro-esophageal reflux disease without esophagitis: Secondary | ICD-10-CM

## 2023-03-07 ENCOUNTER — Other Ambulatory Visit: Payer: Self-pay | Admitting: Family Medicine

## 2023-04-10 NOTE — Progress Notes (Unsigned)
Cardiology Office Note:   Date:  04/13/2023  ID:  Kortlynn, Mcnaught [birth year], MRN [id number] PCP: Sonny Masters, FNP  [facility] Providers Cardiologist:  Rollene Rotunda, MD     History of Present Illness:   Norma Barajas is a 82 y.o. female who presents for follow up of atrial fibrillation.  She has been seen by Dr. Eden Emms and Dr. Elberta Fortis.  She did have an essentially normal echo in 2017.  She was treated with Tikosyn for a while but could not afford this.  She has been treated with Sotalol.   She has had recurrent fibrillation and was followed in the Atrial Fib clinic.  She has been treated with amiodarone and DCCV and she had recurrent fib.  She was to have another DCCV but she declined and wanted to pursue rate control with amiodarone.    Since I last saw her she lost her husband last winter.   She is sad and lonely but her niece helps take care of her.  The patient lives by her self and does some light household chores.  She walks with a walker.  She has had falls before.  She has not had any new shortness of breath, PND or orthopnea.  She has not had any new palpitations, presyncope or syncope.  She has had no weight gain or edema   ROS: As stated in the HPI and negative for all other systems.  Studies Reviewed:    EKG: Atrial fibrillation, rate 82, low voltage, RSR prime V1 and V2, no acute ST-T wave changes.   Risk Assessment/Calculations:    CHA2DS2-VASc Score = 4   This indicates a 4.8% annual risk of stroke. The patient's score is based upon: CHF History: 0 HTN History: 1 Diabetes History: 0 Stroke History: 0 Vascular Disease History: 0 Age Score: 2 Gender Score: 1    Physical Exam:   VS:  BP 122/80   Pulse 80   Ht 4\' 8"  (1.422 m)   Wt 107 lb (48.5 kg)   BMI 23.99 kg/m    Wt Readings from Last 3 Encounters:  04/13/23 107 lb (48.5 kg)  02/03/23 108 lb 9.6 oz (49.3 kg)  11/03/22 104 lb 12.8 oz (47.5 kg)     GEN: Well nourished, well developed in no  acute distress NECK: No JVD; No carotid bruits CARDIAC: Irregular RR, there is 6 apical systolic murmur holosystolic and radiating to the axilla, no diastolic murmurs, rubs, gallops RESPIRATORY:  Clear to auscultation without rales, wheezing or rhonchi  ABDOMEN: Soft, non-tender, non-distended EXTREMITIES:  No edema; No deformity   ASSESSMENT AND PLAN:   Essential hypertension:     Her blood pressure was running low at the last visit and I reduced her metoprolol.  She would not be a good candidate for med titration.    Atherosclerosis of native coronary artery of native heart without angina pectoris:   She is not having any chest discomfort.  No further ischemia workup.   Atrial fibrillation (HCC):   This seems to be chronic.  She has had reasonable rate control.  She is too frail for Watchman and not a good candidate for anticoagulation with falls and fall risk.  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  I will check an echo as below.   No change in therapy at this time.  Mixed hyperlipidemia: LDL was 88 with an HDL of 56.  No  change in therapy.  Pulmonary hypertension: I will reassess.  She has had some right ventricular dilatation and dysfunction.  However, surprisingly she is relatively asymptomatic.  MR: This was mild to moderate.  She had severe tricuspid regurgitation but again I am managing this rather conservatively.  Signed, Rollene Rotunda, MD

## 2023-04-13 ENCOUNTER — Encounter: Payer: Self-pay | Admitting: Cardiology

## 2023-04-13 ENCOUNTER — Ambulatory Visit (INDEPENDENT_AMBULATORY_CARE_PROVIDER_SITE_OTHER): Payer: Medicare HMO | Admitting: Cardiology

## 2023-04-13 VITALS — BP 122/80 | HR 80 | Ht <= 58 in | Wt 107.0 lb

## 2023-04-13 DIAGNOSIS — I482 Chronic atrial fibrillation, unspecified: Secondary | ICD-10-CM | POA: Diagnosis not present

## 2023-04-13 DIAGNOSIS — I10 Essential (primary) hypertension: Secondary | ICD-10-CM | POA: Diagnosis not present

## 2023-04-13 DIAGNOSIS — E785 Hyperlipidemia, unspecified: Secondary | ICD-10-CM | POA: Diagnosis not present

## 2023-04-13 NOTE — Patient Instructions (Signed)
Medication Instructions:  The current medical regimen is effective;  continue present plan and medications.  *If you need a refill on your cardiac medications before your next appointment, please call your pharmacy*  Testing/Procedures: Your physician has requested that you have an echocardiogram. Echocardiography is a painless test that uses sound waves to create images of your heart. It provides your doctor with information about the size and shape of your heart and how well your heart's chambers and valves are working. This procedure takes approximately one hour. There are no restrictions for this procedure. Please do NOT wear cologne, perfume, aftershave, or lotions (deodorant is allowed). Please arrive 15 minutes prior to your appointment time. This will be completed at [facility] and you will be contacted to be scheduled.   Follow-Up: At [facility], you and your health needs are our priority.  As part of our continuing mission to provide you with exceptional heart care, we have created designated Provider Care Teams.  These Care Teams include your primary Cardiologist (physician) and Advanced Practice Providers (APPs -  Physician Assistants and Nurse Practitioners) who all work together to provide you with the care you need, when you need it.  We recommend signing up for the patient portal called "MyChart".  Sign up information is provided on this After Visit Summary.  MyChart is used to connect with patients for Virtual Visits (Telemedicine).  Patients are able to view lab/test results, encounter notes, upcoming appointments, etc.  Non-urgent messages can be sent to your provider as well.   To learn more about what you can do with MyChart, go to [email or web address].    Your next appointment:   6 month(s)  Provider:   Rollene Rotunda, MD

## 2023-04-14 ENCOUNTER — Telehealth: Payer: Self-pay | Admitting: Cardiology

## 2023-04-14 NOTE — Telephone Encounter (Signed)
Checking percert on the following patient for testing scheduled at [facility].    ECHO - 05-26-2023

## 2023-04-16 ENCOUNTER — Encounter (HOSPITAL_COMMUNITY): Payer: Self-pay | Admitting: Family Medicine

## 2023-04-16 ENCOUNTER — Emergency Department (HOSPITAL_COMMUNITY): Payer: Medicare HMO

## 2023-04-16 ENCOUNTER — Inpatient Hospital Stay (HOSPITAL_COMMUNITY)
Admission: EM | Admit: 2023-04-16 | Discharge: 2023-04-19 | DRG: 309 | Disposition: A | Discharge status: Home Health | Payer: Medicare HMO | Attending: Internal Medicine | Admitting: Internal Medicine

## 2023-04-16 ENCOUNTER — Other Ambulatory Visit: Payer: Self-pay

## 2023-04-16 DIAGNOSIS — I2510 Atherosclerotic heart disease of native coronary artery without angina pectoris: Secondary | ICD-10-CM | POA: Diagnosis present

## 2023-04-16 DIAGNOSIS — Z833 Family history of diabetes mellitus: Secondary | ICD-10-CM | POA: Diagnosis not present

## 2023-04-16 DIAGNOSIS — Z8249 Family history of ischemic heart disease and other diseases of the circulatory system: Secondary | ICD-10-CM | POA: Diagnosis not present

## 2023-04-16 DIAGNOSIS — Z823 Family history of stroke: Secondary | ICD-10-CM | POA: Diagnosis not present

## 2023-04-16 DIAGNOSIS — E876 Hypokalemia: Secondary | ICD-10-CM | POA: Diagnosis present

## 2023-04-16 DIAGNOSIS — I081 Rheumatic disorders of both mitral and tricuspid valves: Secondary | ICD-10-CM | POA: Diagnosis not present

## 2023-04-16 DIAGNOSIS — E7800 Pure hypercholesterolemia, unspecified: Secondary | ICD-10-CM | POA: Diagnosis not present

## 2023-04-16 DIAGNOSIS — E785 Hyperlipidemia, unspecified: Secondary | ICD-10-CM | POA: Diagnosis present

## 2023-04-16 DIAGNOSIS — Z888 Allergy status to other drugs, medicaments and biological substances status: Secondary | ICD-10-CM | POA: Diagnosis not present

## 2023-04-16 DIAGNOSIS — I272 Pulmonary hypertension, unspecified: Secondary | ICD-10-CM | POA: Diagnosis present

## 2023-04-16 DIAGNOSIS — Z800 Family history of malignant neoplasm of digestive organs: Secondary | ICD-10-CM | POA: Diagnosis not present

## 2023-04-16 DIAGNOSIS — I4821 Permanent atrial fibrillation: Secondary | ICD-10-CM | POA: Diagnosis not present

## 2023-04-16 DIAGNOSIS — R0602 Shortness of breath: Secondary | ICD-10-CM | POA: Diagnosis not present

## 2023-04-16 DIAGNOSIS — K219 Gastro-esophageal reflux disease without esophagitis: Secondary | ICD-10-CM | POA: Diagnosis present

## 2023-04-16 DIAGNOSIS — I4891 Unspecified atrial fibrillation: Secondary | ICD-10-CM | POA: Diagnosis not present

## 2023-04-16 DIAGNOSIS — I10 Essential (primary) hypertension: Secondary | ICD-10-CM | POA: Diagnosis present

## 2023-04-16 DIAGNOSIS — E782 Mixed hyperlipidemia: Secondary | ICD-10-CM | POA: Diagnosis not present

## 2023-04-16 DIAGNOSIS — Z8261 Family history of arthritis: Secondary | ICD-10-CM | POA: Diagnosis not present

## 2023-04-16 DIAGNOSIS — R0902 Hypoxemia: Secondary | ICD-10-CM | POA: Diagnosis not present

## 2023-04-16 DIAGNOSIS — I5032 Chronic diastolic (congestive) heart failure: Secondary | ICD-10-CM | POA: Diagnosis present

## 2023-04-16 DIAGNOSIS — Z7982 Long term (current) use of aspirin: Secondary | ICD-10-CM

## 2023-04-16 DIAGNOSIS — J9811 Atelectasis: Secondary | ICD-10-CM | POA: Diagnosis not present

## 2023-04-16 DIAGNOSIS — Z596 Low income: Secondary | ICD-10-CM

## 2023-04-16 DIAGNOSIS — I110 Hypertensive heart disease with heart failure: Secondary | ICD-10-CM | POA: Diagnosis not present

## 2023-04-16 DIAGNOSIS — Z83438 Family history of other disorder of lipoprotein metabolism and other lipidemia: Secondary | ICD-10-CM

## 2023-04-16 DIAGNOSIS — I499 Cardiac arrhythmia, unspecified: Secondary | ICD-10-CM | POA: Diagnosis not present

## 2023-04-16 DIAGNOSIS — Z8262 Family history of osteoporosis: Secondary | ICD-10-CM

## 2023-04-16 DIAGNOSIS — Z881 Allergy status to other antibiotic agents status: Secondary | ICD-10-CM

## 2023-04-16 DIAGNOSIS — Z602 Problems related to living alone: Secondary | ICD-10-CM | POA: Diagnosis present

## 2023-04-16 DIAGNOSIS — Z885 Allergy status to narcotic agent status: Secondary | ICD-10-CM | POA: Diagnosis not present

## 2023-04-16 DIAGNOSIS — Z66 Do not resuscitate: Secondary | ICD-10-CM | POA: Diagnosis not present

## 2023-04-16 DIAGNOSIS — M810 Age-related osteoporosis without current pathological fracture: Secondary | ICD-10-CM | POA: Diagnosis present

## 2023-04-16 DIAGNOSIS — Z79899 Other long term (current) drug therapy: Secondary | ICD-10-CM

## 2023-04-16 DIAGNOSIS — R0689 Other abnormalities of breathing: Secondary | ICD-10-CM | POA: Diagnosis not present

## 2023-04-16 DIAGNOSIS — Z8049 Family history of malignant neoplasm of other genital organs: Secondary | ICD-10-CM

## 2023-04-16 DIAGNOSIS — R000 Tachycardia, unspecified: Secondary | ICD-10-CM | POA: Diagnosis not present

## 2023-04-16 DIAGNOSIS — K5903 Drug induced constipation: Secondary | ICD-10-CM

## 2023-04-16 LAB — COMPREHENSIVE METABOLIC PANEL
ALT: 21 U/L (ref 0–44)
AST: 34 U/L (ref 15–41)
Albumin: 4 g/dL (ref 3.5–5.0)
Alkaline Phosphatase: 103 U/L (ref 38–126)
Anion gap: 13 (ref 5–15)
BUN: 28 mg/dL — ABNORMAL HIGH (ref 8–23)
CO2: 24 mmol/L (ref 22–32)
Calcium: 8.3 mg/dL — ABNORMAL LOW (ref 8.9–10.3)
Chloride: 100 mmol/L (ref 98–111)
Creatinine, Ser: 1.03 mg/dL — ABNORMAL HIGH (ref 0.44–1.00)
GFR, Estimated: 54 mL/min — ABNORMAL LOW (ref >60)
Glucose, Bld: 170 mg/dL — ABNORMAL HIGH (ref 70–99)
Potassium: 3.3 mmol/L — ABNORMAL LOW (ref 3.5–5.1)
Sodium: 137 mmol/L (ref 135–145)
Total Bilirubin: 0.8 mg/dL (ref 0.3–1.2)
Total Protein: 7.2 g/dL (ref 6.5–8.1)

## 2023-04-16 LAB — CBC WITH DIFFERENTIAL/PLATELET
Abs Immature Granulocytes: 0.04 10*3/uL (ref 0.00–0.07)
Basophils Absolute: 0 10*3/uL (ref 0.0–0.1)
Basophils Relative: 0 %
Eosinophils Absolute: 0.1 10*3/uL (ref 0.0–0.5)
Eosinophils Relative: 1 %
HCT: 43.7 % (ref 36.0–46.0)
Hemoglobin: 14.5 g/dL (ref 12.0–15.0)
Immature Granulocytes: 0 %
Lymphocytes Relative: 13 %
Lymphs Abs: 1.2 10*3/uL (ref 0.7–4.0)
MCH: 32.4 pg (ref 26.0–34.0)
MCHC: 33.2 g/dL (ref 30.0–36.0)
MCV: 97.8 fL (ref 80.0–100.0)
Monocytes Absolute: 0.8 10*3/uL (ref 0.1–1.0)
Monocytes Relative: 8 %
Neutro Abs: 7 10*3/uL (ref 1.7–7.7)
Neutrophils Relative %: 78 %
Platelets: 133 10*3/uL — ABNORMAL LOW (ref 150–400)
RBC: 4.47 MIL/uL (ref 3.87–5.11)
RDW: 13.1 % (ref 11.5–15.5)
WBC: 9.1 10*3/uL (ref 4.0–10.5)
nRBC: 0 % (ref 0.0–0.2)

## 2023-04-16 LAB — TROPONIN I (HIGH SENSITIVITY)
Troponin I (High Sensitivity): 4 ng/L (ref <18)
Troponin I (High Sensitivity): 4 ng/L (ref <18)

## 2023-04-16 LAB — MAGNESIUM: Magnesium: 2.1 mg/dL (ref 1.7–2.4)

## 2023-04-16 MED ORDER — METOPROLOL TARTRATE 25 MG PO TABS
25.0000 mg | ORAL_TABLET | Freq: Once | ORAL | Status: AC
  Administered 2023-04-16: 25 mg via ORAL

## 2023-04-16 MED ORDER — ONDANSETRON HCL 4 MG/2ML IJ SOLN: 4.0000 mg | Freq: Four times a day (QID) | INTRAMUSCULAR | Status: DC | PRN

## 2023-04-16 MED ORDER — SIMVASTATIN 20 MG PO TABS
20.0000 mg | ORAL_TABLET | Freq: Every day | ORAL | Status: DC
  Administered 2023-04-17 – 2023-04-18 (×2): 20 mg via ORAL
  Filled 2023-04-16 (×2): qty 1

## 2023-04-16 MED ORDER — HEPARIN SODIUM (PORCINE) 5000 UNIT/ML IJ SOLN
5000.0000 [IU] | Freq: Three times a day (TID) | INTRAMUSCULAR | Status: DC
  Administered 2023-04-17 – 2023-04-19 (×8): 5000 [IU] via SUBCUTANEOUS
  Filled 2023-04-16 (×8): qty 1

## 2023-04-16 MED ORDER — POTASSIUM CHLORIDE CRYS ER 20 MEQ PO TBCR
40.0000 meq | EXTENDED_RELEASE_TABLET | Freq: Once | ORAL | Status: AC
  Administered 2023-04-16: 40 meq via ORAL
  Filled 2023-04-16: qty 2

## 2023-04-16 MED ORDER — METOPROLOL TARTRATE 50 MG PO TABS
50.0000 mg | ORAL_TABLET | Freq: Two times a day (BID) | ORAL | Status: DC
  Administered 2023-04-17 (×2): 50 mg via ORAL
  Filled 2023-04-16 (×3): qty 1

## 2023-04-16 MED ORDER — OXYCODONE HCL 5 MG PO TABS
5.0000 mg | ORAL_TABLET | ORAL | Status: DC | PRN
  Filled 2023-04-16: qty 1

## 2023-04-16 MED ORDER — ASPIRIN 81 MG PO TBEC
324.0000 mg | DELAYED_RELEASE_TABLET | Freq: Once | ORAL | Status: AC
  Administered 2023-04-16: 324 mg via ORAL
  Filled 2023-04-16: qty 4

## 2023-04-16 MED ORDER — MELATONIN 3 MG PO TABS
6.0000 mg | ORAL_TABLET | Freq: Every day | ORAL | Status: DC
  Administered 2023-04-16 – 2023-04-18 (×3): 6 mg via ORAL
  Filled 2023-04-16 (×3): qty 2

## 2023-04-16 MED ORDER — PANTOPRAZOLE SODIUM 40 MG PO TBEC
40.0000 mg | DELAYED_RELEASE_TABLET | Freq: Every day | ORAL | Status: DC
  Administered 2023-04-17 – 2023-04-19 (×3): 40 mg via ORAL
  Filled 2023-04-16 (×3): qty 1

## 2023-04-16 MED ORDER — ONDANSETRON HCL 4 MG PO TABS: 4.0000 mg | ORAL_TABLET | Freq: Four times a day (QID) | ORAL | Status: DC | PRN

## 2023-04-16 MED ORDER — ACETAMINOPHEN 325 MG PO TABS
650.0000 mg | ORAL_TABLET | Freq: Four times a day (QID) | ORAL | Status: DC | PRN
  Administered 2023-04-17 – 2023-04-19 (×4): 650 mg via ORAL
  Filled 2023-04-16 (×5): qty 2

## 2023-04-16 MED ORDER — DILTIAZEM HCL-DEXTROSE 125-5 MG/125ML-% IV SOLN (PREMIX)
5.0000 mg/h | INTRAVENOUS | Status: DC
  Administered 2023-04-16 – 2023-04-17 (×2): 5 mg/h via INTRAVENOUS
  Filled 2023-04-16: qty 125

## 2023-04-16 MED ORDER — CHLORHEXIDINE GLUCONATE CLOTH 2 % EX PADS
6.0000 | MEDICATED_PAD | Freq: Every day | CUTANEOUS | Status: DC
  Administered 2023-04-16 – 2023-04-19 (×4): 6 via TOPICAL

## 2023-04-16 MED ORDER — ACETAMINOPHEN 650 MG RE SUPP: 650.0000 mg | Freq: Four times a day (QID) | RECTAL | Status: DC | PRN

## 2023-04-16 MED ORDER — DILTIAZEM HCL 25 MG/5ML IV SOLN
10.0000 mg | Freq: Once | INTRAVENOUS | Status: AC
  Administered 2023-04-16: 10 mg via INTRAVENOUS
  Filled 2023-04-16: qty 5

## 2023-04-16 MED ORDER — METOPROLOL TARTRATE 50 MG PO TABS
50.0000 mg | ORAL_TABLET | Freq: Once | ORAL | Status: AC
  Administered 2023-04-16: 50 mg via ORAL
  Filled 2023-04-16: qty 1

## 2023-04-16 MED ORDER — AMLODIPINE BESYLATE 5 MG PO TABS
5.0000 mg | ORAL_TABLET | Freq: Every day | ORAL | Status: DC
  Administered 2023-04-17: 5 mg via ORAL
  Filled 2023-04-16: qty 1

## 2023-04-16 MED ORDER — DILTIAZEM HCL-DEXTROSE 125-5 MG/125ML-% IV SOLN (PREMIX)
5.0000 mg/h | INTRAVENOUS | Status: DC
  Administered 2023-04-16: 5 mg/h via INTRAVENOUS
  Filled 2023-04-16: qty 125

## 2023-04-16 NOTE — ED Provider Notes (Signed)
[place] EMERGENCY DEPARTMENT AT [facility] Provider Note   CSN: [id number] Arrival date & time: 04/16/23  1813     History {Add pertinent medical, surgical, social history, OB history to HPI:1} Chief Complaint  Patient presents with   Atrial Fibrillation    Recent onset    Norma Barajas is a 82 y.o. female.  Patient has a history of atrial fibrillation.  She takes a beta-blocker to control it.  Patient felt like her heart was pounding and she was having chest discomfort.  EMS arrived and her heart rate was 180.  They gave her some Cardizem and her heart rate slowed down.   Atrial Fibrillation       Home Medications Prior to Admission medications   Medication Sig Start Date End Date Taking? Authorizing Provider  acetaminophen (TYLENOL) 650 MG CR tablet Take 1,300 mg by mouth every 6 (six) hours. Displaced intertrochanteric fracture of right femur   Yes [provider]  amLODipine (NORVASC) 5 MG tablet TAKE 1 TABLET EVERY DAY 12/08/22  Yes Rollene Rotunda, MD  aspirin EC 81 MG tablet Take 324 mg by mouth once. Swallow whole. Once only doses taken prior to EMS arrival for symptoms   Yes [provider]  ASPIRIN LOW DOSE 81 MG tablet TAKE 1 TABLET TWICE DAILY Patient taking differently: Take 81 mg by mouth every morning. 06/23/22  Yes Gwenlyn Fudge, FNP  bisacodyl (DULCOLAX) 10 MG suppository Place 1 suppository (10 mg total) rectally as needed for moderate constipation. 01/06/22  Yes Deliah Boston F, FNP  cholecalciferol (VITAMIN D3) 25 MCG (1000 UNIT) tablet Take 1,000 Units by mouth in the morning.   Yes [provider]  metoprolol tartrate (LOPRESSOR) 50 MG tablet Take 1 tablet (50 mg total) by mouth 2 (two) times daily. 09/01/22  Yes Rollene Rotunda, MD  omeprazole (PRILOSEC) 20 MG capsule TAKE 1 CAPSULE EVERY DAY 02/28/23  Yes Rakes, Doralee Albino, FNP  sennosides-docusate sodium (SENOKOT-S) 8.6-50 MG tablet Take 1 tablet by mouth at  bedtime. Patient taking differently: Take 1 tablet by mouth every morning. 04/13/22  Yes Deliah Boston F, FNP  simvastatin (ZOCOR) 20 MG tablet TAKE 1 TABLET EVERY DAY Patient taking differently: Take 20 mg by mouth daily at 6 PM. 03/07/23  Yes Rakes, Doralee Albino, FNP  torsemide (DEMADEX) 20 MG tablet Take 1 tablet (20 mg total) by mouth 2 (two) times daily. 02/03/23  Yes Rakes, Doralee Albino, FNP      Allergies    Codeine, Doxycycline, Triamterene-hctz, Atorvastatin, Crestor [rosuvastatin calcium], Morphine, and Risedronate sodium    Review of Systems   Review of Systems  Physical Exam Updated Vital Signs BP (!) 163/123   Pulse (!) 158   Temp 98.1 F (36.7 C) (Oral)   Resp (!) 28   SpO2 93%  Physical Exam  ED Results / Procedures / Treatments   Labs (all labs ordered are listed, but only abnormal results are displayed) Labs Reviewed  CBC WITH DIFFERENTIAL/PLATELET - Abnormal; Notable for the following components:      Result Value   Platelets 133 (*)    All other components within normal limits  COMPREHENSIVE METABOLIC PANEL - Abnormal; Notable for the following components:   Potassium 3.3 (*)    Glucose, Bld 170 (*)    BUN 28 (*)    Creatinine, Ser 1.03 (*)    Calcium 8.3 (*)    GFR, Estimated 54 (*)    All other components within normal limits  MAGNESIUM  TROPONIN I (HIGH SENSITIVITY)  TROPONIN I (HIGH SENSITIVITY)    EKG None  Radiology DG Chest Port 1 View  Result Date: 04/16/2023 CLINICAL DATA:  Shortness of breath EXAM: PORTABLE CHEST 1 VIEW COMPARISON:  08/12/2021 FINDINGS: Stable enlargement of the cardiomediastinal silhouette. Aortic atherosclerotic calcification. Low lung volumes accentuate pulmonary vascularity. Bibasilar atelectasis. No pleural effusion or pneumothorax. No displaced rib fractures. IMPRESSION: Low lung volumes with basilar atelectasis. Electronically Signed   By: Minerva Fester M.D.   On: 04/16/2023 19:35    Procedures Procedures  {Document  cardiac monitor, telemetry assessment procedure when appropriate:1}  Medications Ordered in ED Medications  diltiazem (CARDIZEM) 125 mg in dextrose 5% 125 mL (1 mg/mL) infusion (has no administration in time range)  metoprolol tartrate (LOPRESSOR) tablet 50 mg (50 mg Oral Given 04/16/23 2036)  potassium chloride SA (KLOR-CON M) CR tablet 40 mEq (40 mEq Oral Given 04/16/23 2036)  diltiazem (CARDIZEM) injection 10 mg (10 mg Intravenous Given 04/16/23 2122)    ED Course/ Medical Decision Making/ A&P   {  CRITICAL CARE Performed by: Bethann Berkshire Total critical care time: 45 minutes Critical care time was exclusive of separately billable procedures and treating other patients. Critical care was necessary to treat or prevent imminent or life-threatening deterioration. Critical care was time spent personally by me on the following activities: development of treatment plan with patient and/or surrogate as well as nursing, discussions with consultants, evaluation of patient's response to treatment, examination of patient, obtaining history from patient or surrogate, ordering and performing treatments and interventions, ordering and review of laboratory studies, ordering and review of radiographic studies, pulse oximetry and re-evaluation of patient's condition.   Patient was given some Lopressor and observed in the emergency department.  She went into another rapid atrial fibs rate of about 150.  She was given more Cardizem and started on a drip. Click here for ABCD2, HEART and other calculatorsREFRESH Note before signing :1}                          Medical Decision Making Amount and/or Complexity of Data Reviewed Labs: ordered. Radiology: ordered.  Risk Prescription drug management. Decision regarding hospitalization.   Patient with atrial fibs and rapid rate.  She will be admitted to medicine  {Document critical care time when appropriate:1} {Document review of labs and clinical decision  tools ie heart score, Chads2Vasc2 etc:1}  {Document your independent review of radiology images, and any outside records:1} {Document your discussion with family members, caretakers, and with consultants:1} {Document social determinants of health affecting pt's care:1} {Document your decision making why or why not admission, treatments were needed:1} Final Clinical Impression(s) / ED Diagnoses Final diagnoses:  Atrial fibrillation with RVR (HCC)    Rx / DC Orders ED Discharge Orders     None

## 2023-04-16 NOTE — Assessment & Plan Note (Signed)
Continue Statin 

## 2023-04-16 NOTE — ED Triage Notes (Addendum)
Pt called EMS today for HTN and heart racing. Pt states she saw Dr. Antoine Poche this week for "feeling bad" and her "heart failure"  States she was told then that she had afib but no new meds were prescribed.  States she takes a fluid pill but no blood thinners.  Does have pressure in her chest today.   EMS found pt HR in the 180s.  Report giving a total of 16 mg cardizem and HR stabilized in the low 90s.  Also received 900 of NS en route.

## 2023-04-16 NOTE — Assessment & Plan Note (Signed)
Continue PPI ?

## 2023-04-16 NOTE — Assessment & Plan Note (Signed)
-  Repleted and within normal limits currently -Goal is for potassium above 4 as much as possible. -Magnesium has been check and was WNL at this time. -Continue to follow trend/stability of her electrolytes.

## 2023-04-16 NOTE — Assessment & Plan Note (Addendum)
-  Euvolemic and compensated -safe to resume home diuretics dosage -daily weight, low sodium diet and outpatient follow up with cardiology service recommended.

## 2023-04-16 NOTE — Assessment & Plan Note (Signed)
-  Rate up to 180s reportedly -Not on anticoagulation due fall risk -CHADSVasc 6 -Continue aspirin -Continue metoprolol -titrate diltiazem drip -Echo in the AM -TSH -Potassium replaced, continue to monitor electrolytes and replace as indicated -Monitor on tele

## 2023-04-16 NOTE — Assessment & Plan Note (Addendum)
-  Continue statin, beta-blocker and aspirin -Patient denies chest pain or shortness of breath at the moment -EKG demonstrating nonspecific changes associated with ongoing A-fib with RVR at time of admission. -Continue outpatient follow-up with cardiology service.

## 2023-04-16 NOTE — Assessment & Plan Note (Signed)
-  Continue lopressor -Continue norvasc -On diltiazem drip for afib with RVR -Continue to monitor

## 2023-04-17 ENCOUNTER — Inpatient Hospital Stay (HOSPITAL_COMMUNITY): Payer: Medicare HMO

## 2023-04-17 ENCOUNTER — Other Ambulatory Visit (HOSPITAL_COMMUNITY): Payer: Self-pay | Admitting: *Deleted

## 2023-04-17 DIAGNOSIS — I4891 Unspecified atrial fibrillation: Secondary | ICD-10-CM

## 2023-04-17 DIAGNOSIS — I110 Hypertensive heart disease with heart failure: Secondary | ICD-10-CM

## 2023-04-17 DIAGNOSIS — I2510 Atherosclerotic heart disease of native coronary artery without angina pectoris: Secondary | ICD-10-CM

## 2023-04-17 DIAGNOSIS — K219 Gastro-esophageal reflux disease without esophagitis: Secondary | ICD-10-CM

## 2023-04-17 DIAGNOSIS — I5032 Chronic diastolic (congestive) heart failure: Secondary | ICD-10-CM

## 2023-04-17 DIAGNOSIS — I10 Essential (primary) hypertension: Secondary | ICD-10-CM

## 2023-04-17 DIAGNOSIS — E782 Mixed hyperlipidemia: Secondary | ICD-10-CM | POA: Diagnosis not present

## 2023-04-17 DIAGNOSIS — E876 Hypokalemia: Secondary | ICD-10-CM

## 2023-04-17 LAB — COMPREHENSIVE METABOLIC PANEL
ALT: 19 U/L (ref 0–44)
AST: 29 U/L (ref 15–41)
Albumin: 3.7 g/dL (ref 3.5–5.0)
Alkaline Phosphatase: 91 U/L (ref 38–126)
Anion gap: 10 (ref 5–15)
BUN: 20 mg/dL (ref 8–23)
CO2: 28 mmol/L (ref 22–32)
Calcium: 8.9 mg/dL (ref 8.9–10.3)
Chloride: 103 mmol/L (ref 98–111)
Creatinine, Ser: 0.83 mg/dL (ref 0.44–1.00)
GFR, Estimated: >60 mL/min (ref >60)
Glucose, Bld: 110 mg/dL — ABNORMAL HIGH (ref 70–99)
Potassium: 4.3 mmol/L (ref 3.5–5.1)
Sodium: 141 mmol/L (ref 135–145)
Total Bilirubin: 0.8 mg/dL (ref 0.3–1.2)
Total Protein: 6.7 g/dL (ref 6.5–8.1)

## 2023-04-17 LAB — CBC WITH DIFFERENTIAL/PLATELET
Abs Immature Granulocytes: 0.02 10*3/uL (ref 0.00–0.07)
Basophils Absolute: 0.1 10*3/uL (ref 0.0–0.1)
Basophils Relative: 1 %
Eosinophils Absolute: 0 10*3/uL (ref 0.0–0.5)
Eosinophils Relative: 0 %
HCT: 44.5 % (ref 36.0–46.0)
Hemoglobin: 14.6 g/dL (ref 12.0–15.0)
Immature Granulocytes: 0 %
Lymphocytes Relative: 17 %
Lymphs Abs: 1.4 10*3/uL (ref 0.7–4.0)
MCH: 32.4 pg (ref 26.0–34.0)
MCHC: 32.8 g/dL (ref 30.0–36.0)
MCV: 98.9 fL (ref 80.0–100.0)
Monocytes Absolute: 0.9 10*3/uL (ref 0.1–1.0)
Monocytes Relative: 11 %
Neutro Abs: 5.8 10*3/uL (ref 1.7–7.7)
Neutrophils Relative %: 71 %
Platelets: 139 10*3/uL — ABNORMAL LOW (ref 150–400)
RBC: 4.5 MIL/uL (ref 3.87–5.11)
RDW: 13.2 % (ref 11.5–15.5)
WBC: 8.2 10*3/uL (ref 4.0–10.5)
nRBC: 0 % (ref 0.0–0.2)

## 2023-04-17 LAB — ECHOCARDIOGRAM COMPLETE
Area-P 1/2: 4.8 cm2
Est EF: 55
Height: 56 in
MV M vel: 4.82 m/s
MV Peak grad: 92.9 mmHg
P 1/2 time: 485 msec
Radius: 0.4 cm
S' Lateral: 2.6 cm
Weight: 1703.71 oz

## 2023-04-17 LAB — HEMOGLOBIN A1C
Hgb A1c MFr Bld: 5.4 % (ref 4.8–5.6)
Mean Plasma Glucose: 108.28 mg/dL

## 2023-04-17 LAB — TSH: TSH: 1.283 u[IU]/mL (ref 0.350–4.500)

## 2023-04-17 LAB — TROPONIN I (HIGH SENSITIVITY): Troponin I (High Sensitivity): 5 ng/L (ref <18)

## 2023-04-17 LAB — MAGNESIUM: Magnesium: 2.3 mg/dL (ref 1.7–2.4)

## 2023-04-17 MED ORDER — TEMAZEPAM 7.5 MG PO CAPS
15.0000 mg | ORAL_CAPSULE | Freq: Once | ORAL | Status: AC
  Administered 2023-04-17: 15 mg via ORAL
  Filled 2023-04-17: qty 2

## 2023-04-17 MED ORDER — DIPHENHYDRAMINE HCL 50 MG/ML IJ SOLN
25.0000 mg | Freq: Once | INTRAMUSCULAR | Status: AC
  Administered 2023-04-17: 25 mg via INTRAVENOUS
  Filled 2023-04-17: qty 1

## 2023-04-17 NOTE — TOC Initial Note (Signed)
Transition of Care [facility]) - Initial/Assessment Note    Patient Details  Name: Norma Barajas MRN: [id number] Date of Birth: Nov 23, 1940  [facility]) CM/SW Contact:    Catalina Gravel, LCSW Phone Number: 04/17/2023, 11:03 AM  Clinical Narrative:                 Pt arrived by EMS from home, Afib.  Cardiology will see Monday.  TOC to follow.     Barriers to Discharge: Continued Medical Work up   Patient Goals and CMS Choice            Expected Discharge Plan and Services                                              Prior Living Arrangements/Services                       Activities of Daily Living Home Assistive Devices/Equipment: Eyeglasses, Environmental consultant (specify type) ADL Screening (condition at time of admission) Patient's cognitive ability adequate to safely complete daily activities?: Yes Is the patient deaf or have difficulty hearing?: No Does the patient have difficulty seeing, even when wearing glasses/contacts?: No Does the patient have difficulty concentrating, remembering, or making decisions?: No Patient able to express need for assistance with ADLs?: Yes Does the patient have difficulty dressing or bathing?: No Independently performs ADLs?: Yes (appropriate for developmental age) Does the patient have difficulty walking or climbing stairs?: Yes Weakness of Legs: Both Weakness of Arms/Hands: None  Permission Sought/Granted                  Emotional Assessment              Admission diagnosis:  Atrial fibrillation with RVR (HCC) [I48.91] Patient Active Problem List   Diagnosis Date Noted   Atrial fibrillation with RVR (HCC) 04/16/2023   Stage 3a chronic kidney disease (HCC) 11/03/2022   Iron deficiency 11/03/2022   Nonspecific abnormal electrocardiogram (ECG) (EKG) 08/29/2022   Abnormal SPEP 02/07/2022   Degenerative disc disease, lumbar 12/20/2021   Controlled substance agreement signed 12/20/2021   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/2021   Gallbladder sludge 03/27/2021   Aortic atherosclerosis (HCC) 03/27/2021   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) 10/23/2020   Severe tricuspid valve regurgitation 10/23/2020   Hypokalemia 09/16/2020   Early satiety 07/15/2020   Difficulty sleeping 06/22/2019   Paroxysmal atrial fibrillation (HCC) 08/21/2015   Lumbar pain 08/07/2015   Drug induced constipation 12/14/2013   Coronary atherosclerosis of native coronary artery 07/24/2013   Pulmonary nodule 02/18/2011   Hyperlipidemia 07/29/2008   GAD (generalized anxiety disorder) 07/29/2008   Primary hypertension 07/29/2008   GERD 07/29/2008   Osteoporosis 07/29/2008   PCP:  Sonny Masters, FNP Pharmacy:   [facility] - [place], [place] - [address] [place] [address] Phone: [phone] Fax: [phone]  [facility] [place], [place] - [address] [place] [address] Phone: [phone] Fax: [phone]  [facility] - [place], [place] - [address] [place] [address] Phone: [phone] Fax: [phone]     Social Determinants of Health (SDOH) Social History: SDOH Screenings  Food Insecurity: No Food Insecurity (04/16/2023)  Housing: Low Risk  (04/16/2023)  Transportation Needs: No Transportation Needs (04/16/2023)  Utilities: Not At Risk (04/16/2023)  Depression (PHQ2-9): Low Risk  (12/24/2021)  Tobacco Use: Low Risk  (04/16/2023)   SDOH Interventions:     Readmission Risk Interventions    03/26/2021   12:33 PM 03/25/2021    2:59 PM  Readmission Risk Prevention Plan  Medication Screening  Complete  Transportation Screening  Complete  PCP or Specialist Appt within 5-7 Days Complete   Home Care Screening Complete   Medication Review (RN CM) Complete

## 2023-04-17 NOTE — Progress Notes (Signed)
Patient seen and examined; admitted after midnight secondary to chest tightness, palpitations and dyspnea exertion.  Workup demonstrating A-fib with RVR; patient with underlying history of paroxysmal atrial fibrillation and previous obtained to control condition without success requiring cardioversion, sotalol and the use of Tikosyn (last 2 unable to complete treatment secondary to lack of financial resources).  Patient was found not a good candidate for anticoagulation secondary to fall risk; she is on aspirin.  So far good response to the use of metoprolol and Cardizem drip.  Please refer to H&P written by Dr. Carren Rang on 04/17/2023 for further info/details on admission.  Plan: -Continue close monitoring to patient's electrolytes with repletion as needed. -Continue the use of Cardizem and metoprolol -Follow-up 2D echo results -Cardiology has been consulted and will follow recommendations.  Vassie Loll MD [phone]

## 2023-04-17 NOTE — H&P (Signed)
History and Physical    Patient: Norma Barajas ZOX:[id number] DOB: [birth year] DOA: 04/16/2023 DOS: the patient was seen and examined on 04/17/2023 PCP: Sonny Masters, FNP  Patient coming from: Home  Chief Complaint:  Chief Complaint  Patient presents with   Atrial Fibrillation    Recent onset   HPI: Norma Barajas is a 82 y.o. female with medical history significant of acoustic neuroma, BPPV, coronary artery disease, depression, diastolic dysfunction, essential hypertension, GERD, hyperlipidemia, paroxysmal atrial fibrillation, and more presents the ED with a chief complaint of high blood pressure and A-fib.  Patient reports that she went grocery shopping today in the normal active bringing her groceries and completely exhausted her.  She reports she thought she might pass out by the time she got all the groceries to the kitchen.  That feeling went away and later at 4 PM she had sudden onset of chest tightness and palpitations.  Patient reports she was sitting on the couch when that happened.  She did not have associated dizziness or headache.  She reports the chest tightness was substernal.  It did not radiate.  She is not sure what made it better, but it did get better in the ER.  It did not get better with EMS when they initially brought her heart rate down.  She reports no associated nausea or diaphoresis.  Patient reports that right now her neck hurts.  It is the back of her neck.  She thinks it is because of the way she is laying.  Laying swing makes it worse.  Sitting up would make it better.  Patient has no other complaints at this time.  Cardiology note reveals that patient has had paroxysmal atrial fibrillation for some time.  They recommended an echo to be done and that was ordered for July 18.  In the past patient has been on Tikosyn but she had financial barriers that prevented her from staying on that.  She is also been on sotalol and amiodarone.  She is also had cardioversion with recurrence  of A-fib.  Patient reports that she was taken off the amiodarone a couple years ago.  It was not doing anything anyway per her report.  Patient does not smoke, does not drink, does not use illicit drugs.  She is vaccinated for COVID and flu.  Patient reports that she would want to be DNR. Review of Systems: As mentioned in the history of present illness. All other systems reviewed and are negative. 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 Nonobstructive minimal CAD 10/2005.   Depression    Diastolic dysfunction    Grade 1. Ejection fraction 60-65%.   Dysrhythmia    a fib   Erosive esophagitis    Essential hypertension    Fracture of ramus of right pubis with routine healing 12/09/2020   GERD (gastroesophageal reflux disease)    Grade III hemorrhoids    History of hiatal hernia    History of prediabetes    Hypercholesterolemia    Internal hemorrhoids with complication 07/29/2008   OCT 2015 FLEX SIG/IH BANDING     Intertrochanteric fracture of femur (HCC)    5/16 - 03/26/2021 fracture sustained in a mechanical fall.  IM nailing  performed 5/17 by Dr Thane Edu Warfarin held because of posttraumatic pelvic hematoma.  Postop DVT with 81 mg aspirin twice daily x28 days.   Major depressive disorder, recurrent, moderate (HCC) 12/09/2020   Migraine    "used to have them right bad; I don't now" (04/11/2015)   MITRAL REGURGITATION 04/27/2010   Qualifier: Diagnosis of  By: Eden Emms, MD, Harrington Challenger    Osteoporosis    Paroxysmal atrial fibrillation [facility])    Pelvic hematoma, female 03/27/2021   Sustained in mechanical fall.  Warfarin held.  IM nailing performed 5/17 with postop DVT prophylaxis 81 mg twice daily x28 days.   PONV (postoperative nausea and vomiting)     Vertigo    Past Surgical History:  Procedure Laterality Date   BRAVO [facility] STUDY  11/15/2012   Procedure: BRAVO PH STUDY;  Surgeon: West Bali, MD;  Location: [facility] ENDO SUITE;  Service: Endoscopy;;   CARDIOVERSION N/A 05/16/2020   Procedure: CARDIOVERSION;  Surgeon: Little Ishikawa, MD;  Location: [facility] ENDOSCOPY;  Service: Cardiovascular;  Laterality: N/A;   CATARACT EXTRACTION W/ INTRAOCULAR LENS  IMPLANT, BILATERAL Bilateral    COLONOSCOPY  2008   Dr. Darrick Penna: internal hemorrhoids    DILATION AND CURETTAGE OF UTERUS     ESOPHAGEAL DILATION N/A 06/16/2021   Procedure: ESOPHAGEAL DILATION;  Surgeon: Dolores Frame, MD;  Location: [facility] ENDO SUITE;  Service: Gastroenterology;  Laterality: N/A;   ESOPHAGOGASTRODUODENOSCOPY (EGD) WITH ESOPHAGEAL DILATION  2001   Dr. Arlyce Dice: erosive esophagitis, esophageal stricture, duodenitis, s/p Savary dilation   ESOPHAGOGASTRODUODENOSCOPY (EGD) WITH ESOPHAGEAL DILATION  11/15/2012   [id number] web was found & MOST LIKELY CAUSE FOR DYAPHAGIA/Polyp was found in the gastric body and gastric fundus/ gastritis on bx   ESOPHAGOGASTRODUODENOSCOPY (EGD) WITH PROPOFOL N/A 06/16/2021   Procedure: ESOPHAGOGASTRODUODENOSCOPY (EGD) WITH PROPOFOL;  Surgeon: Dolores Frame, MD;  Location: [facility];  Service: Gastroenterology;  Laterality: N/A;  8:15   EYE SURGERY Bilateral    "laser OR after cataract OR; cause I couldn't see"   FLEXIBLE SIGMOIDOSCOPY N/A 08/22/2014   mild diverticulosis in sigmoid, moderate sized Grade 3 hemorrhoids s/p banding X 3.    FRACTURE SURGERY Right    below the knee - 2 bones broke has plates   HEMORRHOID BANDING N/A 08/22/2014   Procedure: HEMORRHOID BANDING;  Surgeon: West Bali, MD;  Location: [facility];  Service: Endoscopy;  Laterality: N/A;   HEMORRHOID SURGERY N/A 03/30/2018   Procedure: EXTENSIVE HEMORRHOIDECTOMY;  Surgeon: Lucretia Roers, MD;  Location: [facility] ORS;  Service: General;  Laterality: N/A;    INTRAMEDULLARY (IM) NAIL INTERTROCHANTERIC Right 03/24/2021   Procedure: INTRAMEDULLARY (IM) NAIL INTERTROCHANTRIC;  Surgeon: Oliver Barre, MD;  Location: [facility] ORS;  Service: Orthopedics;  Laterality: Right;   OPEN REDUCTION INTERNAL FIXATION (ORIF) TIBIA/FIBULA FRACTURE Right 2013   broke tibia and fibula after falling down stairs   PROLAPSED UTERINE FIBROID LIGATION  2015   TOTAL ABDOMINAL HYSTERECTOMY     Social History:  reports that she has never smoked. She has never used smokeless tobacco. She reports that she does not drink alcohol and does not use drugs.  Allergies  Allergen Reactions   Codeine Nausea And Vomiting   Doxycycline Other (See Comments)    Chest congestion   Triamterene-Hctz Other (See Comments)    weakness   Atorvastatin Other (See Comments)    Myalgias    Crestor [Rosuvastatin Calcium] Other (See Comments)    weakness   Morphine Nausea Only  Risedronate Sodium Other (See Comments)    ACTONEL - reflux    Family History  Problem Relation Age of Onset   Colon cancer Mother 1   Heart disease Mother    Osteoporosis Mother    Hip fracture Mother    Stroke Sister    Diabetes Sister    Osteoporosis Sister    Arthritis Sister    Uterine cancer Sister    Stroke Sister    Heart disease Father    Hyperlipidemia Brother    Hypertension Brother    Heart disease Brother    Stroke Brother    Heart disease Sister    Dementia Sister    Diabetes Son    Stroke Son    Heart attack Neg Hx     Prior to Admission medications   Medication Sig Start Date End Date Taking? Authorizing Provider  acetaminophen (TYLENOL) 650 MG CR tablet Take 1,300 mg by mouth every 6 (six) hours. Displaced intertrochanteric fracture of right femur   Yes [provider]  amLODipine (NORVASC) 5 MG tablet TAKE 1 TABLET EVERY DAY 12/08/22  Yes Rollene Rotunda, MD  aspirin EC 81 MG tablet Take 324 mg by mouth once. Swallow whole. Once only doses taken prior to EMS arrival for  symptoms   Yes [provider]  ASPIRIN LOW DOSE 81 MG tablet TAKE 1 TABLET TWICE DAILY Patient taking differently: Take 81 mg by mouth every morning. 06/23/22  Yes Gwenlyn Fudge, FNP  bisacodyl (DULCOLAX) 10 MG suppository Place 1 suppository (10 mg total) rectally as needed for moderate constipation. 01/06/22  Yes Deliah Boston F, FNP  cholecalciferol (VITAMIN D3) 25 MCG (1000 UNIT) tablet Take 1,000 Units by mouth in the morning.   Yes [provider]  metoprolol tartrate (LOPRESSOR) 50 MG tablet Take 1 tablet (50 mg total) by mouth 2 (two) times daily. 09/01/22  Yes Rollene Rotunda, MD  omeprazole (PRILOSEC) 20 MG capsule TAKE 1 CAPSULE EVERY DAY 02/28/23  Yes Rakes, Doralee Albino, FNP  sennosides-docusate sodium (SENOKOT-S) 8.6-50 MG tablet Take 1 tablet by mouth at bedtime. Patient taking differently: Take 1 tablet by mouth every morning. 04/13/22  Yes Deliah Boston F, FNP  simvastatin (ZOCOR) 20 MG tablet TAKE 1 TABLET EVERY DAY Patient taking differently: Take 20 mg by mouth daily at 6 PM. 03/07/23  Yes Rakes, Doralee Albino, FNP  torsemide (DEMADEX) 20 MG tablet Take 1 tablet (20 mg total) by mouth 2 (two) times daily. 02/03/23  Yes Sonny Masters, FNP    Physical Exam: Vitals:   04/17/23 0100 04/17/23 0115 04/17/23 0130 04/17/23 0145  BP: 130/82 (!) 125/105 123/74 (!) 151/93  Pulse: 72 73 78 76  Resp: (!) 24 (!) 25 (!) 24 18  Temp:      TempSrc:      SpO2: 95% 96% 96% 96%  Weight:      Height:       1.  General: Patient lying supine in bed,  no acute distress   2. Psychiatric: Alert and oriented x 3, mood and behavior normal for situation, pleasant and cooperative with exam   3. Neurologic: Speech and language are normal, face is symmetric, moves all 4 extremities voluntarily, at baseline without acute deficits on limited exam   4. HEENMT:  Head is atraumatic, normocephalic, pupils reactive to light, neck is supple, trachea is midline, mucous membranes are moist   5.  Respiratory : Lungs are clear to auscultation bilaterally without wheezing, rhonchi, rales, no cyanosis,  no increase in work of breathing or accessory muscle use   6. Cardiovascular : Heart rate normal, rhythm is continues to be irregular, murmur present, rubs or gallops, no peripheral edema, peripheral pulses palpated   7. Gastrointestinal:  Abdomen is soft, nondistended, nontender to palpation bowel sounds active, no masses or organomegaly palpated   8. Skin:  Skin is warm, dry and intact without rashes, acute lesions, or ulcers on limited exam   9.Musculoskeletal:  No acute deformities or trauma, no asymmetry in tone, no peripheral edema, peripheral pulses palpated, no tenderness to palpation in the extremities  Data Reviewed: In the ED Temp 98.1, heart rate 85-158, respiratory rate 19-28, blood pressure 130/86-163/123 Troponin 4, 4 No leukocytosis with a white blood cell count of 9.1, hemoglobin 14.5 Chemistry reveals a hypokalemia at 3.3, 40 mEq potassium given in the ED Patient is a hyperglycemia at 170 EKG shows a heart rate of 162 atrial fibrillation with RVR.  There are some ST depressions are likely related to rate Admission requested for atrial fibrillation with RVR  Assessment and Plan: Atrial fibrillation with RVR (HCC) -Rate up to 180s reportedly -Not on anticoagulation due fall risk -CHADSVasc 6 -Continue aspirin -Continue metoprolol -titrate diltiazem drip -Echo in the AM -TSH -Potassium replaced, continue to monitor electrolytes and replace as indicated -Monitor on tele  Hypertensive heart disease with chronic diastolic congestive heart failure (HCC) -Holding torsemide - euvolemic at this time -Update Echo   Hypokalemia -Potassium 3.3  - in ED -Trend in the AM -Mag normal  Coronary atherosclerosis of native coronary artery -Continue statin, lopressor, asa -EKG has some nonspecific changes that are likely due to rate -Trop normal -Monitor on  tele   GERD -Continue PPI  Primary hypertension -Continue lopressor -Continue norvasc -On diltiazem drip for afib with RVR -Continue to monitor  Hyperlipidemia -Continue Statin      Advance Care Planning:   Code Status: DNR  Consults: None at this time  Family Communication: No family at bedside  Severity of Illness: The appropriate patient status for this patient is INPATIENT. Inpatient status is judged to be reasonable and necessary in order to provide the required intensity of service to ensure the patient's safety. The patient's presenting symptoms, physical exam findings, and initial radiographic and laboratory data in the context of their chronic comorbidities is felt to place them at high risk for further clinical deterioration. Furthermore, it is not anticipated that the patient will be medically stable for discharge from the hospital within 2 midnights of admission.   * I certify that at the point of admission it is my clinical judgment that the patient will require inpatient hospital care spanning beyond 2 midnights from the point of admission due to high intensity of service, high risk for further deterioration and high frequency of surveillance required.*  Author: Lilyan Gilford, DO 04/17/2023 2:04 AM  For on call review [email or web address].

## 2023-04-17 NOTE — Progress Notes (Signed)
*  PRELIMINARY RESULTS* Echocardiogram 2D Echocardiogram has been performed.  Stacey Drain 04/17/2023, 1:33 PM

## 2023-04-18 DIAGNOSIS — E876 Hypokalemia: Secondary | ICD-10-CM | POA: Diagnosis not present

## 2023-04-18 DIAGNOSIS — R0602 Shortness of breath: Secondary | ICD-10-CM

## 2023-04-18 DIAGNOSIS — K219 Gastro-esophageal reflux disease without esophagitis: Secondary | ICD-10-CM | POA: Diagnosis not present

## 2023-04-18 DIAGNOSIS — I4821 Permanent atrial fibrillation: Principal | ICD-10-CM

## 2023-04-18 DIAGNOSIS — E782 Mixed hyperlipidemia: Secondary | ICD-10-CM | POA: Diagnosis not present

## 2023-04-18 DIAGNOSIS — I272 Pulmonary hypertension, unspecified: Secondary | ICD-10-CM

## 2023-04-18 DIAGNOSIS — I4891 Unspecified atrial fibrillation: Secondary | ICD-10-CM | POA: Diagnosis not present

## 2023-04-18 LAB — MRSA NEXT GEN BY PCR, NASAL: MRSA by PCR Next Gen: NOT DETECTED

## 2023-04-18 MED ORDER — ALBUTEROL SULFATE (2.5 MG/3ML) 0.083% IN NEBU
2.5000 mg | INHALATION_SOLUTION | Freq: Once | RESPIRATORY_TRACT | Status: AC
  Administered 2023-04-18: 2.5 mg via RESPIRATORY_TRACT
  Filled 2023-04-18: qty 3

## 2023-04-18 MED ORDER — METOPROLOL TARTRATE 50 MG PO TABS
75.0000 mg | ORAL_TABLET | Freq: Two times a day (BID) | ORAL | Status: DC
  Administered 2023-04-18 – 2023-04-19 (×3): 75 mg via ORAL
  Filled 2023-04-18 (×4): qty 1

## 2023-04-18 NOTE — Progress Notes (Signed)
Progress Note   Patient: Norma Barajas DOB: [birth year] DOA: 04/16/2023     2 DOS: the patient was seen and examined on 04/18/2023   Brief hospital admission narrative: As per H&P written by Dr.Zierle-Ghosh  Norma Barajas is a 82 y.o. female with medical history significant of acoustic neuroma, BPPV, coronary artery disease, depression, diastolic dysfunction, essential hypertension, GERD, hyperlipidemia, paroxysmal atrial fibrillation, and more presents the ED with a chief complaint of high blood pressure and A-fib.  Patient reports that she went grocery shopping today in the normal active bringing her groceries and completely exhausted her.  She reports she thought she might pass out by the time she got all the groceries to the kitchen.  That feeling went away and later at 4 PM she had sudden onset of chest tightness and palpitations.  Patient reports she was sitting on the couch when that happened.  She did not have associated dizziness or headache.  She reports the chest tightness was substernal.  It did not radiate.  She is not sure what made it better, but it did get better in the ER.  It did not get better with EMS when they initially brought her heart rate down.  She reports no associated nausea or diaphoresis.  Patient reports that right now her neck hurts.  It is the back of her neck.  She thinks it is because of the way she is laying.  Laying swing makes it worse.  Sitting up would make it better.  Patient has no other complaints at this time.   Cardiology note reveals that patient has had paroxysmal atrial fibrillation for some time.  They recommended an echo to be done and that was ordered for July 18.  In the past patient has been on Tikosyn but she had financial barriers that prevented her from staying on that.  She is also been on sotalol and amiodarone.  She is also had cardioversion with recurrence of A-fib.  Patient reports that she was taken off the amiodarone a couple years  ago.  Assessment and Plan: Atrial fibrillation with RVR (HCC) -Rate up to 180s at time of admission and symptomatic -Not on anticoagulation due fall risk -CHADSVasc 6 -Continue aspirin for secondary prevention. -Excellent response to continue use of metoprolol and Cardizem drip; after discussing with cardiology service planning to wean off of Cardizem drip and follow adjusted dose of metoprolol (75 mg twice daily). -Continue to monitor on telemetry and closely follow electrolytes (goal is for potassium above 4 and magnesium above 2).  Hypertensive heart disease with chronic diastolic congestive heart failure (HCC) -Euvolemic and compensated -Holding diuretics currently to have more room on her blood pressure for rate control agents. -Follow cardiology service recommendation.   Hypokalemia -Repleted and within normal limits currently -Goal is for potassium above 4 as much as possible. -Magnesium has been check and is stable at this time. -Continue to follow trend/stability of her electrolytes.  Coronary atherosclerosis of native coronary artery -Continue statin, beta-blocker and aspirin -Patient denies chest pain or shortness of breath at the moment -EKG demonstrating nonspecific changes associated with ongoing A-fib with RVR at time of admission. -Continue patient follow-up with cardiology service.    GERD -Continue PPI -Lifestyle modifications discussed with patient.  Primary hypertension -Blood pressure overall stable at the moment; continue adjusted dose of Lopressor and follow vital signs while weaning her off Cardizem drip. -Low-sodium diet discussed with patient.  Hyperlipidemia -Continue Statin -Heart healthy diet discussed with patient.  Subjective:  Feeling better overall; no chest pain or palpitation reported.  Patient denies nausea, vomiting, dysuria and hematuria.  Expressed feeling weak and at times unsafe living home alone.  Physical Exam: Vitals:    04/18/23 1352 04/18/23 1400 04/18/23 1500 04/18/23 1604  BP:  126/69 127/74 125/76  Pulse: 86 83 81 70  Resp:    19  Temp:    98.1 F (36.7 C)  TempSrc:    Oral  SpO2: 97% 96% 96% 97%  Weight:      Height:    4\' 8"  (1.422 m)   General exam: Alert, awake, oriented x 3; denying chest pain and palpitations.  Chronically ill in appearance.  Afebrile. Respiratory system: Clear to auscultation. Respiratory effort normal.  Good saturation on room air.  No using accessory muscle. Cardiovascular system: Rate controlled, no rubs, no gallops, no JVD. Gastrointestinal system: Abdomen is nondistended, soft and nontender. No organomegaly or masses felt. Normal bowel sounds heard. Central nervous system: No focal neurological deficits. Extremities: No cyanosis or clubbing. Skin: No petechiae. Psychiatry: Judgement and insight appear normal. Mood & affect appropriate.   Latest Data Reviewed: TSH: 1.283 CBC: WBCs 8.2, hemoglobin 14.6 and platelet count 139 K A1c: 5.4 Comprehensive metabolic panel: Sodium 141, potassium 4.3, chloride 103, bicarb 28, BUN 20, creatinine 0.83 and normal LFTs; GFR > 60 Magnesium:2.3  Family Communication: Nephew updated at bedside  Disposition: Status is: Inpatient Remains inpatient appropriate because: Follow cardiology service recommendation for further control of her atrial fibrillation.  Will attempt transition patient off Cardizem drip and follow adjusted dose of beta-blocker.   Planned Discharge Destination: Home; most likely with home health services and ALF placement in the near future.    Time spent: 50 minutes  Author: Vassie Loll, MD 04/18/2023 4:39 PM  For on call review [email or web address].

## 2023-04-18 NOTE — Progress Notes (Signed)
Pt arrived to room 338 via chair from ICU. Pt A&O x4, denies c/o at this time. VSS. Oriented to room and safety procedures, states understanding. Call bell within reach, advised to call for needs.

## 2023-04-18 NOTE — Consult Note (Signed)
Cardiology Consultation   Patient ID: Norma Barajas MRN: [id number]; DOB: 1941/04/05  Admit date: 04/16/2023 Date of Consult: 04/18/2023  PCP:  Sonny Masters, FNP   [facility] Providers Cardiologist:  Rollene Rotunda, MD   {    Patient Profile:   Norma Barajas is a 82 y.o. female with a hx of permanent afib, HTN, pulm HTN with RV dysfunction who is being seen 04/18/2023 for the evaluation of palpitations at the request of Dr.Madera  History of Present Illness:   Norma Barajas 82 yo female history of afib, HTN, coronary atherosclerosis, not on anticoag due to falls, HLD, pulm HTN with RV failure, mitral regurgitation, presented with palpitations and chest tightness. REports medication compliance.    WBC 9.1 Hgb 14.5 Plt 133 K 3.3 Cr 1.03 BUN 28 Mg 2.1 TSH 1.2 Trop 4-->4-->5 EKG afib rate 97 CXR low lung volumes 04/2023 echo: LVE 55%, indet diastolic, D shaped septum, mild RV dysfunction, normal PASP, severe BAE, severe TR.    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 Nonobstructive minimal CAD 10/2005.   Depression    Diastolic dysfunction    Grade 1. Ejection fraction 60-65%.   Dysrhythmia    a fib   Erosive esophagitis    Essential hypertension    Fracture of ramus of right pubis with routine healing 12/09/2020   GERD (gastroesophageal reflux disease)    Grade III hemorrhoids    History of hiatal hernia    History of prediabetes    Hypercholesterolemia    Internal hemorrhoids with complication 07/29/2008   OCT 2015 FLEX SIG/IH BANDING     Intertrochanteric fracture of femur (HCC)    5/16 - 03/26/2021 fracture sustained in a mechanical fall.  IM nailing performed 5/17 by Dr Thane Edu Warfarin held because of posttraumatic pelvic hematoma.   Postop DVT with 81 mg aspirin twice daily x28 days.   Major depressive disorder, recurrent, moderate (HCC) 12/09/2020   Migraine    "used to have them right bad; I don't now" (04/11/2015)   MITRAL REGURGITATION 04/27/2010   Qualifier: Diagnosis of  By: Eden Emms, MD, Harrington Challenger    Osteoporosis    Paroxysmal atrial fibrillation [facility])    Pelvic hematoma, female 03/27/2021   Sustained in mechanical fall.  Warfarin held.  IM nailing performed 5/17 with postop DVT prophylaxis 81 mg twice daily x28 days.   PONV (postoperative nausea and vomiting)    Vertigo     Past Surgical History:  Procedure Laterality Date   BRAVO [facility] STUDY  11/15/2012   Procedure: BRAVO PH STUDY;  Surgeon: West Bali, MD;  Location: [facility] ENDO SUITE;  Service: Endoscopy;;   CARDIOVERSION N/A 05/16/2020   Procedure: CARDIOVERSION;  Surgeon: Little Ishikawa, MD;  Location: [facility] ENDOSCOPY;  Service: Cardiovascular;  Laterality: N/A;   CATARACT EXTRACTION W/ INTRAOCULAR LENS  IMPLANT, BILATERAL Bilateral    COLONOSCOPY  2008   Dr. Darrick Penna: internal hemorrhoids    DILATION AND CURETTAGE OF UTERUS     ESOPHAGEAL DILATION N/A 06/16/2021   Procedure: ESOPHAGEAL DILATION;  Surgeon: Dolores Frame, MD;  Location: [facility] ENDO SUITE;  Service: Gastroenterology;  Laterality: N/A;   ESOPHAGOGASTRODUODENOSCOPY (EGD) WITH ESOPHAGEAL DILATION  2001   Dr. Arlyce Dice: erosive esophagitis, esophageal stricture, duodenitis, s/p Savary dilation   ESOPHAGOGASTRODUODENOSCOPY (EGD) WITH ESOPHAGEAL DILATION  11/15/2012   QMV:HQIONGEXBM web was found & MOST LIKELY CAUSE FOR DYAPHAGIA/Polyp was found in the gastric body and gastric fundus/ gastritis on bx   ESOPHAGOGASTRODUODENOSCOPY (EGD) WITH PROPOFOL N/A 06/16/2021   Procedure: ESOPHAGOGASTRODUODENOSCOPY (EGD) WITH PROPOFOL;  Surgeon: Dolores Frame, MD;  Location: [facility];  Service: Gastroenterology;  Laterality: N/A;  8:15   EYE SURGERY Bilateral    "laser OR after  cataract OR; cause I couldn't see"   FLEXIBLE SIGMOIDOSCOPY N/A 08/22/2014   mild diverticulosis in sigmoid, moderate sized Grade 3 hemorrhoids s/p banding X 3.    FRACTURE SURGERY Right    below the knee - 2 bones broke has plates   HEMORRHOID BANDING N/A 08/22/2014   Procedure: HEMORRHOID BANDING;  Surgeon: West Bali, MD;  Location: [facility];  Service: Endoscopy;  Laterality: N/A;   HEMORRHOID SURGERY N/A 03/30/2018   Procedure: EXTENSIVE HEMORRHOIDECTOMY;  Surgeon: Lucretia Roers, MD;  Location: [facility] ORS;  Service: General;  Laterality: N/A;   INTRAMEDULLARY (IM) NAIL INTERTROCHANTERIC Right 03/24/2021   Procedure: INTRAMEDULLARY (IM) NAIL INTERTROCHANTRIC;  Surgeon: Oliver Barre, MD;  Location: [facility] ORS;  Service: Orthopedics;  Laterality: Right;   OPEN REDUCTION INTERNAL FIXATION (ORIF) TIBIA/FIBULA FRACTURE Right 2013   broke tibia and fibula after falling down stairs   PROLAPSED UTERINE FIBROID LIGATION  2015   TOTAL ABDOMINAL HYSTERECTOMY        Inpatient Medications: Scheduled Meds:  amLODipine  5 mg Oral Daily   Chlorhexidine Gluconate Cloth  6 each Topical Daily   heparin  5,000 Units Subcutaneous Q8H   melatonin  6 mg Oral QHS   metoprolol tartrate  50 mg Oral BID   pantoprazole  40 mg Oral Daily   simvastatin  20 mg Oral q1800   Continuous Infusions:  diltiazem (CARDIZEM) infusion Stopped (04/18/23 0008)   PRN Meds: acetaminophen **OR** acetaminophen, ondansetron **OR** ondansetron (ZOFRAN) IV, oxyCODONE  Allergies:    Allergies  Allergen Reactions   Codeine Nausea And Vomiting   Doxycycline Other (See Comments)    Chest congestion   Triamterene-Hctz Other (See Comments)    weakness   Atorvastatin Other (See Comments)    Myalgias    Crestor [Rosuvastatin Calcium] Other (See Comments)    weakness   Morphine Nausea Only   Risedronate Sodium Other (See Comments)    ACTONEL - reflux    Social History:   Social History   Socioeconomic History    Marital status: Widowed    Spouse name: george    Number of children: 1   Years of education: Not on file   Highest education level: Not on file  Occupational History   Occupation: Retired    Comment: Textile  Tobacco Use   Smoking status: Never   Smokeless tobacco: Never  Vaping Use   Vaping Use: Never used  Substance and Sexual Activity   Alcohol use: No    Alcohol/week: 0.0 standard drinks of alcohol   Drug use: No   Sexual activity: Not Currently    Birth control/protection: Surgical, Post-menopausal    Comment: hyst  Other Topics Concern   Not on file  Social History Narrative   Married   No regular exercise   Social Determinants of Health   Financial Resource Strain: Not on file  Food Insecurity: No Food Insecurity (04/16/2023)   Hunger Vital Sign  Worried About Programme researcher, broadcasting/film/video in the Last Year: Never true    Ran Out of Food in the Last Year: Never true  Transportation Needs: No Transportation Needs (04/16/2023)   PRAPARE - Administrator, Civil Service (Medical): No    Lack of Transportation (Non-Medical): No  Physical Activity: Not on file  Stress: Not on file  Social Connections: Not on file  Intimate Partner Violence: Not At Risk (04/16/2023)   Humiliation, Afraid, Rape, and Kick questionnaire    Fear of Current or Ex-Partner: No    Emotionally Abused: No    Physically Abused: No    Sexually Abused: No    Family History:    Family History  Problem Relation Age of Onset   Colon cancer Mother 31   Heart disease Mother    Osteoporosis Mother    Hip fracture Mother    Stroke Sister    Diabetes Sister    Osteoporosis Sister    Arthritis Sister    Uterine cancer Sister    Stroke Sister    Heart disease Father    Hyperlipidemia Brother    Hypertension Brother    Heart disease Brother    Stroke Brother    Heart disease Sister    Dementia Sister    Diabetes Son    Stroke Son    Heart attack Neg Hx      ROS:  Please see the  history of present illness.   All other ROS reviewed and negative.     Physical Exam/Data:   Vitals:   04/18/23 0427 04/18/23 0500 04/18/23 0600 04/18/23 0700  BP:  126/72 129/69 (!) 145/90  Pulse: 71 75 77 79  Resp: 20 20 (!) 21 18  Temp: 98 F (36.7 C)     TempSrc: Oral     SpO2: 95% 98% 94% 96%  Weight: 47.8 kg     Height:        Intake/Output Summary (Last 24 hours) at 04/18/2023 0833 Last data filed at 04/18/2023 4540 Gross per 24 hour  Intake 690.48 ml  Output 675 ml  Net 15.48 ml      04/18/2023    4:27 AM 04/17/2023    5:00 AM 04/16/2023   10:22 PM  Last 3 Weights  Weight (lbs) 105 lb 6.1 oz 106 lb 7.7 oz 109 lb 2 oz  Weight (kg) 47.8 kg 48.3 kg 49.5 kg     Body mass index is 23.63 kg/m.  General:  Well nourished, well developed, in no acute distress HEENT: normal Neck: no JVD Vascular: No carotid bruits; Distal pulses 2+ bilaterally Cardiac:  irreg Lungs:  mild expiratory wheezing Abd: soft, nontender, no hepatomegaly  Ext: no edema Musculoskeletal:  No deformities, BUE and BLE strength normal and equal Skin: warm and dry  Neuro:  CNs 2-12 intact, no focal abnormalities noted Psych:  Normal affect     Laboratory Data:  High Sensitivity Troponin:   Recent Labs  Lab 04/16/23 1906 04/16/23 2034 04/17/23 0434  TROPONINIHS 4 4 5      Chemistry Recent Labs  Lab 04/16/23 1906 04/16/23 2034 04/17/23 0314  NA 137  --  141  K 3.3*  --  4.3  CL 100  --  103  CO2 24  --  28  GLUCOSE 170*  --  110*  BUN 28*  --  20  CREATININE 1.03*  --  0.83  CALCIUM 8.3*  --  8.9  MG  --  2.1  2.3  GFRNONAA 54*  --  >60  ANIONGAP 13  --  10    Recent Labs  Lab 04/16/23 1906 04/17/23 0314  PROT 7.2 6.7  ALBUMIN 4.0 3.7  AST 34 29  ALT 21 19  ALKPHOS 103 91  BILITOT 0.8 0.8   Lipids No results for input(s): "CHOL", "TRIG", "HDL", "LABVLDL", "LDLCALC", "CHOLHDL" in the last 168 hours.  Hematology Recent Labs  Lab 04/16/23 1906 04/17/23 0314  WBC 9.1  8.2  RBC 4.47 4.50  HGB 14.5 14.6  HCT 43.7 44.5  MCV 97.8 98.9  MCH 32.4 32.4  MCHC 33.2 32.8  RDW 13.1 13.2  PLT 133* 139*   Thyroid  Recent Labs  Lab 04/17/23 0314  TSH 1.283    BNPNo results for input(s): "BNP", "PROBNP" in the last 168 hours.  DDimer No results for input(s): "DDIMER" in the last 168 hours.   Radiology/Studies:  ECHOCARDIOGRAM COMPLETE  Result Date: 04/17/2023    ECHOCARDIOGRAM REPORT   Patient Name:   Norma Barajas Date of Exam: 04/17/2023 Medical Rec #:  [id number]   Height:       56.0 in Accession #:    [id number]  Weight:       106.5 lb Date of Birth:  12/17/1940   BSA:          1.358 m Patient Age:    82 years    BP:           137/79 mmHg Patient Gender: F           HR:           76 bpm. [facility] Procedure: 2D Echo, Cardiac Doppler and Color Doppler Indications:    Atrial Fibrillation I48.91  History:        Patient has prior history of Echocardiogram examinations, most                 recent 09/09/2020. CHF, Arrythmias:Atrial Fibrillation; Risk                 Factors:Hypertension and Dyslipidemia.  Sonographer:    Celesta Gentile RCS Referring Phys: [id number] ASIA B ZIERLE-GHOSH IMPRESSIONS  1. Left ventricular ejection fraction, by estimation, is approximately 55%. The left ventricle has normal function. The left ventricle has no regional wall motion abnormalities. There is mild asymmetric left ventricular hypertrophy of the basal segment.  Left ventricular diastolic parameters are indeterminate. There is the interventricular septum is flattened in diastole ('D' shaped left ventricle), consistent with right ventricular volume overload.  2. Right ventricular systolic function is mildly reduced. The right ventricular size is moderately enlarged. There is normal pulmonary artery systolic pressure. The estimated right ventricular systolic pressure is 30.1 mmHg.  3. Left atrial size was severely dilated.  4. Right atrial size was severely dilated.  5. The mitral  valve is grossly normal. Mild mitral valve regurgitation.  6. Tricuspid valve mildly thickened with incomplete coaptation likely related to annular dilatation. The tricuspid valve is abnormal. Tricuspid valve regurgitation is severe.  7. The aortic valve is tricuspid. Aortic valve regurgitation is mild. Aortic valve sclerosis/calcification is present, without any evidence of aortic stenosis.  8. The inferior vena cava is dilated in size with >50% respiratory variability, suggesting right atrial pressure of 8 mmHg. Comparison(s): No significant change from prior study. Prior images reviewed side by side. FINDINGS  Left Ventricle: Left ventricular ejection fraction, by estimation, is 55%. The left ventricle has normal function. The left  ventricle has no regional wall motion abnormalities. The left ventricular internal cavity size was normal in size. There is mild asymmetric left ventricular hypertrophy of the basal segment. The interventricular septum is flattened in diastole ('D' shaped left ventricle), consistent with right ventricular volume overload. Left ventricular diastolic function could not be evaluated due to atrial fibrillation. Left ventricular diastolic parameters are indeterminate. Right Ventricle: The right ventricular size is moderately enlarged. No increase in right ventricular wall thickness. Right ventricular systolic function is mildly reduced. There is normal pulmonary artery systolic pressure. The tricuspid regurgitant velocity is 2.35 m/s, and with an assumed right atrial pressure of 8 mmHg, the estimated right ventricular systolic pressure is 30.1 mmHg. Left Atrium: Left atrial size was severely dilated. Right Atrium: Right atrial size was severely dilated. Pericardium: There is no evidence of pericardial effusion. Mitral Valve: The mitral valve is grossly normal. Mild mitral annular calcification. Mild mitral valve regurgitation. Tricuspid Valve: Tricuspid valve mildly thickened with  incomplete coaptation likely related to annular dilatation. The tricuspid valve is abnormal. Tricuspid valve regurgitation is severe. Aortic Valve: The aortic valve is tricuspid. There is mild aortic valve annular calcification. Aortic valve regurgitation is mild. Aortic regurgitation PHT measures 485 msec. Aortic valve sclerosis/calcification is present, without any evidence of aortic  stenosis. Pulmonic Valve: The pulmonic valve was grossly normal. Pulmonic valve regurgitation is trivial. Aorta: The aortic root is normal in size and structure. Venous: The inferior vena cava is dilated in size with greater than 50% respiratory variability, suggesting right atrial pressure of 8 mmHg. IAS/Shunts: No atrial level shunt detected by color flow Doppler.  LEFT VENTRICLE PLAX 2D LVIDd:         3.70 cm LVIDs:         2.60 cm LV PW:         0.90 cm LV IVS:        1.20 cm LVOT diam:     1.70 cm LV SV:         37 LV SV Index:   27 LVOT Area:     2.27 cm  RIGHT VENTRICLE RV S prime:     9.14 cm/s TAPSE (M-mode): 1.6 cm LEFT ATRIUM             Index        RIGHT ATRIUM           Index LA diam:        3.70 cm 2.72 cm/m   RA Area:     25.40 cm LA Vol (A2C):   64.7 ml 47.64 ml/m  RA Volume:   91.40 ml  67.31 ml/m LA Vol (A4C):   75.1 ml 55.30 ml/m LA Biplane Vol: 74.1 ml 54.57 ml/m  AORTIC VALVE LVOT Vmax:   87.70 cm/s LVOT Vmean:  57.250 cm/s LVOT VTI:    0.164 m AI PHT:      485 msec  AORTA Ao Root diam: 3.20 cm MITRAL VALVE                  TRICUSPID VALVE MV Area (PHT): 4.80 cm       TR Peak grad:   22.1 mmHg MV Decel Time: 158 msec       TR Vmax:        235.00 cm/s MR Peak grad:    92.9 mmHg MR Mean grad:    56.0 mmHg    SHUNTS MR Vmax:         482.00 cm/s  Systemic  VTI:  0.16 m MR Vmean:        343.0 cm/s   Systemic Diam: 1.70 cm MR PISA:         1.01 cm MR PISA Eff ROA: 5 mm MR PISA Radius:  0.40 cm MV E velocity: 89.10 cm/s Nona Dell MD Electronically signed by Nona Dell MD Signature Date/Time:  04/17/2023/2:02:27 PM    Final    DG Chest Port 1 View  Result Date: 04/16/2023 CLINICAL DATA:  Shortness of breath EXAM: PORTABLE CHEST 1 VIEW COMPARISON:  08/12/2021 FINDINGS: Stable enlargement of the cardiomediastinal silhouette. Aortic atherosclerotic calcification. Low lung volumes accentuate pulmonary vascularity. Bibasilar atelectasis. No pleural effusion or pneumothorax. No displaced rib fractures. IMPRESSION: Low lung volumes with basilar atelectasis. Electronically Signed   By: Minerva Fester M.D.   On: 04/16/2023 19:35     Assessment and Plan:   Permanent Afib -from notes cost issues with dofetilide. Was on sotalol, had recurrent afib. Transitoned to amiodarone, its unclear from notes when amio was stopped.   Last appeared in clinic note [facility] 09/2020. Had prior cardioversions, she eventually resfused any additional DCCVs. At home has been on lopressor 50mg  bid - from EKG trends afib last several years. Was om SR 03/24/21 but not since by EKGs  - presented with afib with RVR - not on anticoag as outpatient due to fall risk - has failed antiarrhythmics, has turned down DCCVs and also not a candidate given she is off anticoag due to falls. Focuse on rate control -on dilt gtt inititally weaned off at midnight, lopressor 50mg  bid.  - increase lopressor to 75mg  bid, hold norvasc for now to allow room with bp's  2. 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   3. SOB - improved with rate control - some wheezing, will give a neb this morning.    For questions or updates, please contact [facility] Please consult [email or web address] for contact info under    Signed, Dina Rich, MD  04/18/2023 8:33 AM

## 2023-04-19 DIAGNOSIS — I10 Essential (primary) hypertension: Secondary | ICD-10-CM | POA: Diagnosis not present

## 2023-04-19 DIAGNOSIS — I4891 Unspecified atrial fibrillation: Secondary | ICD-10-CM | POA: Diagnosis not present

## 2023-04-19 DIAGNOSIS — I110 Hypertensive heart disease with heart failure: Secondary | ICD-10-CM | POA: Diagnosis not present

## 2023-04-19 DIAGNOSIS — E782 Mixed hyperlipidemia: Secondary | ICD-10-CM | POA: Diagnosis not present

## 2023-04-19 LAB — BASIC METABOLIC PANEL WITH GFR
Anion gap: 10 (ref 5–15)
BUN: 14 mg/dL (ref 8–23)
CO2: 26 mmol/L (ref 22–32)
Calcium: 9 mg/dL (ref 8.9–10.3)
Chloride: 103 mmol/L (ref 98–111)
Creatinine, Ser: 0.67 mg/dL (ref 0.44–1.00)
GFR, Estimated: >60 mL/min
Glucose, Bld: 99 mg/dL (ref 70–99)
Potassium: 3.7 mmol/L (ref 3.5–5.1)
Sodium: 139 mmol/L (ref 135–145)

## 2023-04-19 LAB — MAGNESIUM: Magnesium: 2.3 mg/dL (ref 1.7–2.4)

## 2023-04-19 MED ORDER — TUBERCULIN PPD 5 UNIT/0.1ML ID SOLN
5.0000 [IU] | Freq: Once | INTRADERMAL | Status: DC
  Administered 2023-04-19: 5 [IU] via INTRADERMAL
  Filled 2023-04-19: qty 0.1

## 2023-04-19 MED ORDER — TORSEMIDE 20 MG PO TABS
20.0000 mg | ORAL_TABLET | Freq: Two times a day (BID) | ORAL | 2 refills | Status: DC
Start: 2023-04-19 — End: 2023-04-28

## 2023-04-19 MED ORDER — SENNA-DOCUSATE SODIUM 8.6-50 MG PO TABS
1.0000 | ORAL_TABLET | Freq: Every morning | ORAL | Status: DC
Start: 2023-04-19 — End: 2023-05-04

## 2023-04-19 MED ORDER — AMLODIPINE BESYLATE 5 MG PO TABS
2.5000 mg | ORAL_TABLET | Freq: Every day | ORAL | Status: DC
  Administered 2023-04-19: 2.5 mg via ORAL
  Filled 2023-04-19: qty 1

## 2023-04-19 MED ORDER — AMLODIPINE BESYLATE 5 MG PO TABS: 2.5000 mg | ORAL_TABLET | Freq: Every day | ORAL | 2 refills | Status: DC

## 2023-04-19 MED ORDER — METOPROLOL TARTRATE 50 MG PO TABS: 75.0000 mg | ORAL_TABLET | Freq: Two times a day (BID) | ORAL | 3 refills | Status: DC

## 2023-04-19 MED ORDER — TORSEMIDE 20 MG PO TABS
20.0000 mg | ORAL_TABLET | Freq: Two times a day (BID) | ORAL | Status: DC
  Administered 2023-04-19: 20 mg via ORAL
  Filled 2023-04-19: qty 1

## 2023-04-19 NOTE — Progress Notes (Signed)
TB skin test administered at 04/19/23 @ 1620 on the left forearm.

## 2023-04-19 NOTE — [place] FL2 (Signed)
[place] MEDICAID FL2 LEVEL OF CARE FORM     IDENTIFICATION  Patient Name: Norma Barajas Birthdate: [birth year] Sex: female Admission Date (Current Location): 04/16/2023  [facility] and [place] Number:  [employer] and Address:  [facility],  [address] [place] [address]      Provider Number: [id number]  Attending Physician Name and Address:  Vassie Loll, MD  Relative Name and Phone Number:  Ernest Mallick (Niece) [phone]    Current Level of Care: Hospital Recommended Level of Care: Assisted Living Facility Prior Approval Number:    Date Approved/Denied:   PASRR Number:    Discharge Plan: Domiciliary (Rest home)    Current Diagnoses: Patient Active Problem List   Diagnosis Date Noted   Atrial fibrillation with RVR (HCC) 04/16/2023   Stage 3a chronic kidney disease (HCC) 11/03/2022   Iron deficiency 11/03/2022   Nonspecific abnormal electrocardiogram (ECG) (EKG) 08/29/2022   Abnormal SPEP 02/07/2022   Degenerative disc disease, lumbar 12/20/2021   Controlled substance agreement signed 12/20/2021   Hypertensive heart disease with chronic diastolic congestive heart failure (HCC) 03/27/2021   Gallbladder sludge 03/27/2021   Aortic atherosclerosis (HCC) 03/27/2021   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) 10/23/2020   Severe tricuspid valve regurgitation 10/23/2020   Hypokalemia 09/16/2020   Early satiety 07/15/2020   Difficulty sleeping 06/22/2019   Paroxysmal atrial fibrillation (HCC) 08/21/2015   Lumbar pain 08/07/2015   Drug induced constipation 12/14/2013   Coronary atherosclerosis of native coronary artery 07/24/2013   Pulmonary nodule 02/18/2011   Hyperlipidemia 07/29/2008   GAD (generalized anxiety disorder) 07/29/2008   Primary hypertension 07/29/2008   GERD 07/29/2008   Osteoporosis 07/29/2008    Orientation RESPIRATION BLADDER Height & Weight     Self, Time, Place,  Situation  Normal Continent Weight: 47.8 kg Height:  4\' 8"  (142.2 cm)  BEHAVIORAL SYMPTOMS/MOOD NEUROLOGICAL BOWEL NUTRITION STATUS      Continent Diet  AMBULATORY STATUS COMMUNICATION OF NEEDS Skin   Limited Assist Verbally Bruising                       Personal Care Assistance Level of Assistance  Bathing, Feeding, Dressing Bathing Assistance: Limited assistance Feeding assistance: Independent Dressing Assistance: Limited assistance     Functional Limitations Info  Sight, Hearing, Speech Sight Info: Impaired Hearing Info: Impaired Speech Info: Adequate    SPECIAL CARE FACTORS FREQUENCY  PT (By licensed PT)     PT Frequency: 3 times a week              Contractures Contractures Info: Not present    Additional Factors Info  Code Status, Allergies Code Status Info: DNR Allergies Info: codeine, doxycycline, triamterene, atorvastatin, crestor, morphine, risedronate           Current Medications (04/19/2023):  This is the current hospital active medication list Current Facility-Administered Medications  Medication Dose Route Frequency Provider Last Rate Last Admin   acetaminophen (TYLENOL) tablet 650 mg  650 mg Oral Q6H PRN Vassie Loll, MD   650 mg at 04/19/23 1246   Or   acetaminophen (TYLENOL) suppository 650 mg  650 mg Rectal Q6H PRN Vassie Loll, MD       amLODipine (NORVASC) tablet 2.5 mg  2.5 mg Oral Daily Antoine Poche, MD   2.5 mg at 04/19/23 1006   Chlorhexidine Gluconate Cloth 2 % PADS 6 each  6 each Topical Daily Vassie Loll, MD   6 each  at 04/19/23 0812   heparin injection 5,000 Units  5,000 Units Subcutaneous Q8H Vassie Loll, MD   5,000 Units at 04/19/23 1444   melatonin tablet 6 mg  6 mg Oral QHS Vassie Loll, MD   6 mg at 04/18/23 2207   metoprolol tartrate (LOPRESSOR) tablet 75 mg  75 mg Oral BID Vassie Loll, MD   75 mg at 04/19/23 0811   ondansetron (ZOFRAN) tablet 4 mg  4 mg Oral Q6H PRN Vassie Loll, MD       Or    ondansetron [facility]) injection 4 mg  4 mg Intravenous Q6H PRN Vassie Loll, MD       oxyCODONE (Oxy IR/ROXICODONE) immediate release tablet 5 mg  5 mg Oral Q4H PRN Vassie Loll, MD       pantoprazole (PROTONIX) EC tablet 40 mg  40 mg Oral Daily Vassie Loll, MD   40 mg at 04/19/23 0811   simvastatin (ZOCOR) tablet 20 mg  20 mg Oral q1800 Vassie Loll, MD   20 mg at 04/18/23 1729   torsemide (DEMADEX) tablet 20 mg  20 mg Oral BID Antoine Poche, MD   20 mg at 04/19/23 1007   tuberculin injection 5 Units  5 Units Intradermal Once Vassie Loll, MD         Discharge Medications: Please see discharge summary for a list of discharge medications.  Relevant Imaging Results:  Relevant Lab Results:   Additional Information SS# [id number]  Leitha Bleak, RN

## 2023-04-19 NOTE — Plan of Care (Signed)
  Problem: Acute Rehab PT Goals(only PT should resolve) Goal: Patient Will Transfer Sit To/From Stand Outcome: Progressing Flowsheets (Taken 04/19/2023 1308) Patient will transfer sit to/from stand: with supervision Goal: Pt Will Transfer Bed To Chair/Chair To Bed Outcome: Progressing Flowsheets (Taken 04/19/2023 1308) Pt will Transfer Bed to Chair/Chair to Bed: with supervision Goal: Pt Will Ambulate Outcome: Progressing Flowsheets (Taken 04/19/2023 1308) Pt will Ambulate:  50 feet  with rolling walker  with min guard assist Goal: Pt Will Go Up/Down Stairs Outcome: Progressing Flowsheets (Taken 04/19/2023 1308) Pt will Go Up / Down Stairs:  3-5 stairs  with minimal assist  with rail(s)  with least restrictive assistive device Goal: Pt/caregiver will Perform Home Exercise Program Outcome: Progressing Flowsheets (Taken 04/19/2023 1308) Pt/caregiver will Perform Home Exercise Program:  For increased strengthening  For improved balance  With Supervision, verbal cues required/provided   Norma Barajas. Hartnett-Rands, MS, PT Per Diem PT [facility] [phone] 04/19/2023

## 2023-04-19 NOTE — TOC Initial Note (Signed)
Transition of Care [facility]) - Initial/Assessment Note    Patient Details  Name: Norma Barajas MRN: [id number] Date of Birth: 1941-05-22  Transition of Care [facility]) CM/SW Contact:    Leitha Bleak, RN Phone Number: 04/19/2023, 3:41 PM  Clinical Narrative:     Patient admitted with atrial fibrillation with RVR. PT is recommending HHPT. CM spoke with her daughter, They are working on ALF placement with [facility]. CM spoke with Olegario Messier at facility. MD will sign FL2 and order TB test, RN will administer. Eber Jones ( niece) will have her PCP read the TB test. She does want HHPT to start at home until they can get a bed at [facility], Possibly the weekend. MD aware to order. Cory with [facility] accepted.               Expected Discharge Plan: Home w Home Health Services Barriers to Discharge: Barriers Resolved   Patient Goals and CMS Choice Patient states their goals for this hospitalization and ongoing recovery are:: family working on ALF CMS Medicare.gov Compare Post Acute Care list provided to:: Patient Represenative (must comment) Choice offered to / list presented to : Spouse     Expected Discharge Plan and Services     Post Acute Care Choice: Home Health Living arrangements for the past 2 months: Single Family Home Expected Discharge Date: 04/19/23                    Prior Living Arrangements/Services Living arrangements for the past 2 months: Single Family Home Lives with:: Relatives          Current home services: DME   Activities of Daily Living Home Assistive Devices/Equipment: Eyeglasses, Environmental consultant (specify type) ADL Screening (condition at time of admission) Patient's cognitive ability adequate to safely complete daily activities?: Yes Is the patient deaf or have difficulty hearing?: No Does the patient have difficulty seeing, even when wearing glasses/contacts?: No Does the patient have difficulty concentrating, remembering, or making decisions?: No Patient able to express  need for assistance with ADLs?: Yes Does the patient have difficulty dressing or bathing?: No Independently performs ADLs?: Yes (appropriate for developmental age) Does the patient have difficulty walking or climbing stairs?: Yes Weakness of Legs: Both Weakness of Arms/Hands: None  Permission Sought/Granted      Emotional Assessment      Orientation: : Oriented to Self, Oriented to Place, Oriented to  Time, Oriented to Situation Alcohol / Substance Use: Not Applicable Psych Involvement: No (comment)  Admission diagnosis:  Atrial fibrillation with RVR (HCC) [I48.91] Patient Active Problem List   Diagnosis Date Noted   Atrial fibrillation with RVR (HCC) 04/16/2023   Stage 3a chronic kidney disease (HCC) 11/03/2022   Iron deficiency 11/03/2022   Nonspecific abnormal electrocardiogram (ECG) (EKG) 08/29/2022   Abnormal SPEP 02/07/2022   Degenerative disc disease, lumbar 12/20/2021   Controlled substance agreement signed 12/20/2021   Hypertensive heart disease with chronic diastolic congestive heart failure (HCC) 03/27/2021   Gallbladder sludge 03/27/2021   Aortic atherosclerosis (HCC) 03/27/2021   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) 10/23/2020   Severe tricuspid valve regurgitation 10/23/2020   Hypokalemia 09/16/2020   Early satiety 07/15/2020   Difficulty sleeping 06/22/2019   Paroxysmal atrial fibrillation (HCC) 08/21/2015   Lumbar pain 08/07/2015   Drug induced constipation 12/14/2013   Coronary atherosclerosis of native coronary artery 07/24/2013   Pulmonary nodule 02/18/2011   Hyperlipidemia 07/29/2008   GAD (generalized anxiety disorder) 07/29/2008   Primary  hypertension 07/29/2008   GERD 07/29/2008   Osteoporosis 07/29/2008   PCP:  Sonny Masters, FNP Pharmacy:   [facility] Delivery - [address] [place] - [address] [place] [address] Phone: [phone] Fax:  [phone]  [facility] [place], [place] - [address] [place] [address] Phone: [phone] Fax: [phone]  [facility] - [place], [place] - [address] [place] [address] Phone: [phone] Fax: [phone]   Social Determinants of Health (SDOH) Social History: SDOH Screenings   Food Insecurity: No Food Insecurity (04/16/2023)  Housing: Low Risk  (04/16/2023)  Transportation Needs: No Transportation Needs (04/16/2023)  Utilities: Not At Risk (04/16/2023)  Depression (PHQ2-9): Low Risk  (12/24/2021)  Tobacco Use: Low Risk  (04/16/2023)   SDOH Interventions:    Readmission Risk Interventions    03/26/2021   12:33 PM 03/25/2021    2:59 PM  Readmission Risk Prevention Plan  Medication Screening  Complete  Transportation Screening  Complete  PCP or Specialist Appt within 5-7 Days Complete   Home Care Screening Complete   Medication Review (RN CM) Complete

## 2023-04-19 NOTE — Progress Notes (Signed)
Rounding Note    Patient Name: Norma Barajas Date of Encounter: 04/19/2023   HeartCare Cardiologist: Rollene Rotunda, MD   Subjective   No complaints  Inpatient Medications    Scheduled Meds:  Chlorhexidine Gluconate Cloth  6 each Topical Daily   heparin  5,000 Units Subcutaneous Q8H   melatonin  6 mg Oral QHS   metoprolol tartrate  75 mg Oral BID   pantoprazole  40 mg Oral Daily   simvastatin  20 mg Oral q1800   Continuous Infusions:  PRN Meds: acetaminophen **OR** acetaminophen, ondansetron **OR** ondansetron (ZOFRAN) IV, oxyCODONE   Vital Signs    Vitals:   04/18/23 1604 04/18/23 2103 04/19/23 0354 04/19/23 0732  BP: 125/76 (!) 150/91 (!) 137/92 (!) 155/94  Pulse: 70 87 81 82  Resp: 19 18 18    Temp: 98.1 F (36.7 C) 97.8 F (36.6 C) 98.3 F (36.8 C)   TempSrc: Oral Oral Oral   SpO2: 97% 96% 97% 98%  Weight:      Height: 4\' 8"  (1.422 m)       Intake/Output Summary (Last 24 hours) at 04/19/2023 0840 Last data filed at 04/18/2023 1759 Gross per 24 hour  Intake 360 ml  Output --  Net 360 ml      04/18/2023    4:27 AM 04/17/2023    5:00 AM 04/16/2023   10:22 PM  Last 3 Weights  Weight (lbs) 105 lb 6.1 oz 106 lb 7.7 oz 109 lb 2 oz  Weight (kg) 47.8 kg 48.3 kg 49.5 kg      Telemetry    Afib overall controlled rates - Personally Reviewed  ECG    N/a - Personally Reviewed  Physical Exam   GEN: No acute distress.   Neck: No JVD Cardiac: irreg Respiratory: Clear to auscultation bilaterally. GI: Soft, nontender, non-distended  MS: No edema; No deformity. Neuro:  Nonfocal  Psych: Normal affect   Labs    High Sensitivity Troponin:   Recent Labs  Lab 04/16/23 1906 04/16/23 2034 04/17/23 0434  TROPONINIHS 4 4 5      Chemistry Recent Labs  Lab 04/16/23 1906 04/16/23 2034 04/17/23 0314 04/19/23 0412  NA 137  --  141 139  K 3.3*  --  4.3 3.7  CL 100  --  103 103  CO2 24  --  28 26  GLUCOSE 170*  --  110* 99  BUN 28*  --  20 14   CREATININE 1.03*  --  0.83 0.67  CALCIUM 8.3*  --  8.9 9.0  MG  --  2.1 2.3 2.3  PROT 7.2  --  6.7  --   ALBUMIN 4.0  --  3.7  --   AST 34  --  29  --   ALT 21  --  19  --   ALKPHOS 103  --  91  --   BILITOT 0.8  --  0.8  --   GFRNONAA 54*  --  >60 >60  ANIONGAP 13  --  10 10    Lipids No results for input(s): "CHOL", "TRIG", "HDL", "LABVLDL", "LDLCALC", "CHOLHDL" in the last 168 hours.  Hematology Recent Labs  Lab 04/16/23 1906 04/17/23 0314  WBC 9.1 8.2  RBC 4.47 4.50  HGB 14.5 14.6  HCT 43.7 44.5  MCV 97.8 98.9  MCH 32.4 32.4  MCHC 33.2 32.8  RDW 13.1 13.2  PLT 133* 139*   Thyroid  Recent Labs  Lab 04/17/23 0314  TSH 1.283  BNPNo results for input(s): "BNP", "PROBNP" in the last 168 hours.  DDimer No results for input(s): "DDIMER" in the last 168 hours.   Radiology    ECHOCARDIOGRAM COMPLETE  Result Date: 04/17/2023    ECHOCARDIOGRAM REPORT   Patient Name:   Norma Barajas Date of Exam: 04/17/2023 Medical Rec #:  [id number]   Height:       56.0 in Accession #:    [id number]  Weight:       106.5 lb Date of Birth:  [birth year]   BSA:          1.358 m Patient Age:    82 years    BP:           137/79 mmHg Patient Gender: F           HR:           76 bpm. [facility] Procedure: 2D Echo, Cardiac Doppler and Color Doppler Indications:    Atrial Fibrillation I48.91  History:        Patient has prior history of Echocardiogram examinations, most                 recent 09/09/2020. CHF, Arrythmias:Atrial Fibrillation; Risk                 Factors:Hypertension and Dyslipidemia.  Sonographer:    Celesta Gentile RCS Referring Phys: [id number] ASIA B ZIERLE-GHOSH IMPRESSIONS  1. Left ventricular ejection fraction, by estimation, is approximately 55%. The left ventricle has normal function. The left ventricle has no regional wall motion abnormalities. There is mild asymmetric left ventricular hypertrophy of the basal segment.  Left ventricular diastolic parameters are indeterminate.  There is the interventricular septum is flattened in diastole ('D' shaped left ventricle), consistent with right ventricular volume overload.  2. Right ventricular systolic function is mildly reduced. The right ventricular size is moderately enlarged. There is normal pulmonary artery systolic pressure. The estimated right ventricular systolic pressure is 30.1 mmHg.  3. Left atrial size was severely dilated.  4. Right atrial size was severely dilated.  5. The mitral valve is grossly normal. Mild mitral valve regurgitation.  6. Tricuspid valve mildly thickened with incomplete coaptation likely related to annular dilatation. The tricuspid valve is abnormal. Tricuspid valve regurgitation is severe.  7. The aortic valve is tricuspid. Aortic valve regurgitation is mild. Aortic valve sclerosis/calcification is present, without any evidence of aortic stenosis.  8. The inferior vena cava is dilated in size with >50% respiratory variability, suggesting right atrial pressure of 8 mmHg. Comparison(s): No significant change from prior study. Prior images reviewed side by side. FINDINGS  Left Ventricle: Left ventricular ejection fraction, by estimation, is 55%. The left ventricle has normal function. The left ventricle has no regional wall motion abnormalities. The left ventricular internal cavity size was normal in size. There is mild asymmetric left ventricular hypertrophy of the basal segment. The interventricular septum is flattened in diastole ('D' shaped left ventricle), consistent with right ventricular volume overload. Left ventricular diastolic function could not be evaluated due to atrial fibrillation. Left ventricular diastolic parameters are indeterminate. Right Ventricle: The right ventricular size is moderately enlarged. No increase in right ventricular wall thickness. Right ventricular systolic function is mildly reduced. There is normal pulmonary artery systolic pressure. The tricuspid regurgitant velocity is 2.35  m/s, and with an assumed right atrial pressure of 8 mmHg, the estimated right ventricular systolic pressure is 30.1 mmHg. Left Atrium: Left atrial size was severely dilated. Right  Atrium: Right atrial size was severely dilated. Pericardium: There is no evidence of pericardial effusion. Mitral Valve: The mitral valve is grossly normal. Mild mitral annular calcification. Mild mitral valve regurgitation. Tricuspid Valve: Tricuspid valve mildly thickened with incomplete coaptation likely related to annular dilatation. The tricuspid valve is abnormal. Tricuspid valve regurgitation is severe. Aortic Valve: The aortic valve is tricuspid. There is mild aortic valve annular calcification. Aortic valve regurgitation is mild. Aortic regurgitation PHT measures 485 msec. Aortic valve sclerosis/calcification is present, without any evidence of aortic  stenosis. Pulmonic Valve: The pulmonic valve was grossly normal. Pulmonic valve regurgitation is trivial. Aorta: The aortic root is normal in size and structure. Venous: The inferior vena cava is dilated in size with greater than 50% respiratory variability, suggesting right atrial pressure of 8 mmHg. IAS/Shunts: No atrial level shunt detected by color flow Doppler.  LEFT VENTRICLE PLAX 2D LVIDd:         3.70 cm LVIDs:         2.60 cm LV PW:         0.90 cm LV IVS:        1.20 cm LVOT diam:     1.70 cm LV SV:         37 LV SV Index:   27 LVOT Area:     2.27 cm  RIGHT VENTRICLE RV S prime:     9.14 cm/s TAPSE (M-mode): 1.6 cm LEFT ATRIUM             Index        RIGHT ATRIUM           Index LA diam:        3.70 cm 2.72 cm/m   RA Area:     25.40 cm LA Vol (A2C):   64.7 ml 47.64 ml/m  RA Volume:   91.40 ml  67.31 ml/m LA Vol (A4C):   75.1 ml 55.30 ml/m LA Biplane Vol: 74.1 ml 54.57 ml/m  AORTIC VALVE LVOT Vmax:   87.70 cm/s LVOT Vmean:  57.250 cm/s LVOT VTI:    0.164 m AI PHT:      485 msec  AORTA Ao Root diam: 3.20 cm MITRAL VALVE                  TRICUSPID VALVE MV Area (PHT):  4.80 cm       TR Peak grad:   22.1 mmHg MV Decel Time: 158 msec       TR Vmax:        235.00 cm/s MR Peak grad:    92.9 mmHg MR Mean grad:    56.0 mmHg    SHUNTS MR Vmax:         482.00 cm/s  Systemic VTI:  0.16 m MR Vmean:        343.0 cm/s   Systemic Diam: 1.70 cm MR PISA:         1.01 cm MR PISA Eff ROA: 5 mm MR PISA Radius:  0.40 cm MV E velocity: 89.10 cm/s Nona Dell MD Electronically signed by Nona Dell MD Signature Date/Time: 04/17/2023/2:02:27 PM    Final     Cardiac Studies     Patient Profile     Norma Barajas is a 82 y.o. female with a hx of permanent afib, HTN, pulm HTN with RV dysfunction who is being seen 04/18/2023 for the evaluation of palpitations at the request of Dr.Madera   Assessment & Plan    Permanent Afib -  from notes cost issues with dofetilide. Was on sotalol, had recurrent afib. Transitoned to amiodarone, its unclear from notes when amio was stopped.   Last appeared [facility] 09/2020. Had prior cardioversions, she eventually resfused any additional DCCVs. At home has been on lopressor 50mg  bid - from EKG trends afib last several years. Was in SR 03/24/21 but not since by EKGs   - presented with afib with RVR - not on anticoag as outpatient due to fall risk - has failed antiarrhythmics, has turned down DCCVs and also not a candidate given she is off anticoag due to falls. Focus on rate control -on dilt gtt inititally weaned off, lopressor increased to 75mg  bid.     2. 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    3. SOB - improved with rate control - some wheezing, will give a neb this morning.   4. HTN - held norvasc initially to titrate lopressor, bp's elevated restart her norvasc 2.5mg  daily.   Ok for discharge from cardiac standpoint, we will sign off and arrange f/u    For questions or updates, please contact [place] [facility] Please consult [email or web address] for contact info under        Signed, Dina Rich, MD  04/19/2023, 8:40 AM

## 2023-04-19 NOTE — Evaluation (Signed)
Physical Therapy Evaluation Patient Details Name: Norma Barajas MRN: [id number] DOB: [birth year] Today's Date: 04/19/2023  History of Present Illness  As per H&P written by Dr.Zierle-Ghosh   Norma Barajas is a 82 y.o. female with medical history significant of acoustic neuroma, BPPV, coronary artery disease, depression, diastolic dysfunction, essential hypertension, GERD, hyperlipidemia, paroxysmal atrial fibrillation, and more presents the ED with a chief complaint of high blood pressure and A-fib.  Patient reports that she went grocery shopping today in the normal active bringing her groceries and completely exhausted her.  She reports she thought she might pass out by the time she got all the groceries to the kitchen.  That feeling went away and later at 4 PM she had sudden onset of chest tightness and palpitations.  Patient reports she was sitting on the couch when that happened.  She did not have associated dizziness or headache.  She reports the chest tightness was substernal.  It did not radiate.  She is not sure what made it better, but it did get better in the ER.  It did not get better with EMS when they initially brought her heart rate down.  She reports no associated nausea or diaphoresis.  Patient reports that right now her neck hurts.  It is the back of her neck.  She thinks it is because of the way she is laying.  Laying swing makes it worse.  Sitting up would make it better.  Patient has no other complaints at this time.   Clinical Impression  Pt admitted with above diagnosis. Patient lying in bed upon therapist arrival and agreeable to participating in evaluation today. Patient performed bed mobility modified independent. Patient completed transfer with min guard for safety and required cues for step sequencing and hand placement with RW use. Patient ambulated 20 feet demonstrating a slow, somewhat labored cadence using RW. Significant kyphosis noted. Patient demonstrated a narrow base with  toeing out posture. Patient's activity level limited by fatigue  Patient reports her niece in attempting to get her in to an ALF at discharge. Pt currently with functional limitations due to the deficits listed below (see PT Problem List). Pt will benefit from acute skilled PT to increase their independence and safety with mobility to allow discharge.          Recommendations for follow up therapy are one component of a multi-disciplinary discharge planning process, led by the attending physician.  Recommendations may be updated based on patient status, additional functional criteria and insurance authorization.  Follow Up Recommendations       Assistance Recommended at Discharge Intermittent Supervision/Assistance  Patient can return home with the following  A little help with walking and/or transfers;A little help with bathing/dressing/bathroom;Assistance with cooking/housework;Assist for transportation;Help with stairs or ramp for entrance    Equipment Recommendations None recommended by PT  Recommendations for Other Services       Functional Status Assessment Patient has had a recent decline in their functional status and demonstrates the ability to make significant improvements in function in a reasonable and predictable amount of time.     Precautions / Restrictions Precautions Precautions: Fall Precaution Comments: patient reports no falls in the last six months Restrictions Weight Bearing Restrictions: No      Mobility  Bed Mobility Overal bed mobility: Modified Independent             General bed mobility comments: increased time    Transfers Overall transfer level: Needs assistance Equipment used: Rolling walker (  2 wheels) Transfers: Sit to/from Stand, Bed to chair/wheelchair/BSC Sit to Stand: Min guard   Step pivot transfers: Min guard   Anterior-Posterior transfers: Supervision   General transfer comment: min guard for safety; cues for sequencing of  steps and hand placement with RW use.    Ambulation/Gait Ambulation/Gait assistance: Min guard Gait Distance (Feet): 20 Feet Assistive device: Rolling walker (2 wheels) Gait Pattern/deviations: Step-through pattern, Decreased step length - right, Decreased step length - left, Decreased stride length, Narrow base of support Gait velocity: decreased     General Gait Details: slow, somewhat labored cadence using RW, significant kyphosis noted; narrow base with toeing out posture; limited by fatigue  Stairs            Wheelchair Mobility    Modified Rankin (Stroke Patients Only)       Balance Overall balance assessment: Mild deficits observed, not formally tested, History of Falls (patient reports muliple falls with fractures but none in the last six months.)       Pertinent Vitals/Pain Pain Assessment Pain Assessment: 0-10 Pain Score: 7  Pain Location: upper thoracic spine Pain Descriptors / Indicators: Aching, Dull Pain Intervention(s): Limited activity within patient's tolerance, Monitored during session, Repositioned, Patient requesting pain meds-RN notified, RN gave pain meds during session    Home Living Family/patient expects to be discharged to:: Private residence Living Arrangements: Alone Available Help at Discharge: Family;Available PRN/intermittently Type of Home: House Home Access: Stairs to enter Entrance Stairs-Rails: Right Entrance Stairs-Number of Steps: 4   Home Layout: Multi-level;Able to live on main level with bedroom/bathroom (sunken den) Home Equipment: Tub bench;Grab bars - tub/shower;Grab bars - toilet;Rollator (4 wheels);Rolling Walker (2 wheels);Cane - single point;BSC/3in1      Prior Function Prior Level of Function : Needs assist       Physical Assist : Mobility (physical);ADLs (physical)     Mobility Comments: reports ambulating in house with Rollator and SPC or RW for community distances ADLs Comments: neice helps with IADLs      Hand Dominance        Extremity/Trunk Assessment   Upper Extremity Assessment Upper Extremity Assessment: Generalized weakness    Lower Extremity Assessment Lower Extremity Assessment: Generalized weakness    Cervical / Trunk Assessment Cervical / Trunk Assessment: Kyphotic  Communication   Communication: No difficulties  Cognition Arousal/Alertness: Awake/alert Behavior During Therapy: WFL for tasks assessed/performed Overall Cognitive Status: Within Functional Limits for tasks assessed        General Comments      Exercises     Assessment/Plan    PT Assessment Patient needs continued PT services  PT Problem List Decreased strength;Decreased balance;Decreased activity tolerance;Decreased knowledge of use of DME       PT Treatment Interventions DME instruction;Balance training;Patient/family education;Gait training;Therapeutic activities;Therapeutic exercise;Stair training    PT Goals (Current goals can be found in the Care Plan section)  Acute Rehab PT Goals Patient Stated Goal: Go to ALF. PT Goal Formulation: With patient Time For Goal Achievement: 05/03/23 Potential to Achieve Goals: Good    Frequency Min 3X/week        AM-PAC PT "6 Clicks" Mobility  Outcome Measure Help needed turning from your back to your side while in a flat bed without using bedrails?: None Help needed moving from lying on your back to sitting on the side of a flat bed without using bedrails?: None Help needed moving to and from a bed to a chair (including a wheelchair)?: A Little Help needed standing up  from a chair using your arms (e.g., wheelchair or bedside chair)?: A Little Help needed to walk in hospital room?: A Little Help needed climbing 3-5 steps with a railing? : A Little 6 Click Score: 20    End of Session Equipment Utilized During Treatment: Gait belt Activity Tolerance: Patient tolerated treatment well;Patient limited by fatigue Patient left: in bed;with call  bell/phone within reach (patient seated at edge of bed to eat her lunch - nursing aware.) Nurse Communication: Mobility status;Patient requests pain meds PT Visit Diagnosis: Unsteadiness on feet (R26.81);Other abnormalities of gait and mobility (R26.89);History of falling (Z91.81);Muscle weakness (generalized) (M62.81)    Time: 4098-1191 PT Time Calculation (min) (ACUTE ONLY): 25 min   Charges:   PT Evaluation $PT Eval Low Complexity: 1 Low PT Treatments $Therapeutic Activity: 8-22 mins        Katina Dung. Hartnett-Rands, MS, PT Per Diem PT [facility] [place] [phone]  [name]  Hartnett-Rands 04/19/2023, 1:04 PM

## 2023-04-19 NOTE — Care Management Important Message (Signed)
Important Message  Patient Details  Name: CLEVA CAMERO MRN: [id number] Date of Birth: Jan 17, 1941   Medicare Important Message Given:  N/A - LOS <3 / Initial given by admissions     Corey Harold 04/19/2023, 11:54 AM

## 2023-04-19 NOTE — Discharge Summary (Signed)
Physician Discharge Summary   Patient: Norma Barajas MRN: [id number] DOB: 08-29-1941  Admit date:     04/16/2023  Discharge date: 04/19/23  Discharge Physician: Vassie Loll   PCP: Sonny Masters, FNP   Recommendations at discharge:  Reassess blood pressure and adjust antihypertensive regimen as needed Repeat basic metabolic panel to follow electrolytes and renal function Assist patient with ALF placement.  Discharge Diagnoses: Active Problems:   Hyperlipidemia   Primary hypertension   GERD   Coronary atherosclerosis of native coronary artery   Hypokalemia   Hypertensive heart disease with chronic diastolic congestive heart failure (HCC)   Atrial fibrillation with RVR (HCC)  Brief hospital admission narrative: As per H&P written by Dr.Zierle-Ghosh  Norma Barajas is a 82 y.o. female with medical history significant of acoustic neuroma, BPPV, coronary artery disease, depression, diastolic dysfunction, essential hypertension, GERD, hyperlipidemia, paroxysmal atrial fibrillation, and more presents the ED with a chief complaint of high blood pressure and A-fib.  Patient reports that she went grocery shopping today in the normal active bringing her groceries and completely exhausted her.  She reports she thought she might pass out by the time she got all the groceries to the kitchen.  That feeling went away and later at 4 PM she had sudden onset of chest tightness and palpitations.  Patient reports she was sitting on the couch when that happened.  She did not have associated dizziness or headache.  She reports the chest tightness was substernal.  It did not radiate.  She is not sure what made it better, but it did get better in the ER.  It did not get better with EMS when they initially brought her heart rate down.  She reports no associated nausea or diaphoresis.  Patient reports that right now her neck hurts.  It is the back of her neck.  She thinks it is because of the way she is laying.  Laying  swing makes it worse.  Sitting up would make it better.  Patient has no other complaints at this time.   Cardiology note reveals that patient has had paroxysmal atrial fibrillation for some time.  They recommended an echo to be done and that was ordered for July 18.  In the past patient has been on Tikosyn but she had financial barriers that prevented her from staying on that.  She is also been on sotalol and amiodarone.  She is also had cardioversion with recurrence of A-fib.  Patient reports that she was taken off the amiodarone a couple years ago.  Assessment and Plan: Atrial fibrillation with RVR (HCC) -Rate up to 180s at time of admission and symptomatic -Not on anticoagulation due fall risk -CHADSVasc 6 -Continue aspirin for secondary prevention. -Excellent response to continue use of metoprolol and Cardizem drip; after discussing with cardiology service planning to wean off of Cardizem drip and follow adjusted dose of metoprolol (75 mg twice daily). -Continue outpatient follow with cardiology service.   Hypertensive heart disease with chronic diastolic congestive heart failure (HCC) -Euvolemic and compensated -safe to resume home diuretics dosage -daily weight, low sodium diet and outpatient follow up with cardiology service recommended.     Hypokalemia -Repleted and within normal limits currently -Goal is for potassium above 4 as much as possible. -Magnesium has been check and was WNL at this time. -Continue to follow trend/stability of her electrolytes.   Coronary atherosclerosis of native coronary artery -Continue statin, beta-blocker and aspirin -Patient denies chest pain or shortness of  breath at the moment -EKG demonstrating nonspecific changes associated with ongoing A-fib with RVR at time of admission. -Continue outpatient follow-up with cardiology service.    GERD -Continue PPI -Lifestyle modifications discussed with patient.  Primary hypertension -Blood  pressure overall stable at the moment; continue adjusted dose of Lopressor and follow vital signs while weaning her off Cardizem drip. -Low-sodium diet discussed with patient.  Hyperlipidemia -Continue Statin -Heart healthy diet discussed with patient.   Consultants: Cardiology service Procedures performed: See below for x-ray report Disposition: Home Diet recommendation: Heart healthy/low-sodium diet.  DISCHARGE MEDICATION: Allergies as of 04/19/2023       Reactions   Codeine Nausea And Vomiting   Doxycycline Other (See Comments)   Chest congestion   Triamterene-hctz Other (See Comments)   weakness   Atorvastatin Other (See Comments)   Myalgias   Crestor [rosuvastatin Calcium] Other (See Comments)   weakness   Morphine Nausea Only   Risedronate Sodium Other (See Comments)   ACTONEL - reflux        Medication List     TAKE these medications    acetaminophen 650 MG CR tablet Commonly known as: TYLENOL Take 1,300 mg by mouth every 6 (six) hours. Displaced intertrochanteric fracture of right femur   amLODipine 5 MG tablet Commonly known as: NORVASC Take 0.5 tablets (2.5 mg total) by mouth daily. What changed: how much to take   Aspirin Low Dose 81 MG tablet Generic drug: aspirin EC TAKE 1 TABLET TWICE DAILY What changed:  how much to take when to take this Another medication with the same name was removed. Continue taking this medication, and follow the directions you see here.   bisacodyl 10 MG suppository Commonly known as: Dulcolax Place 1 suppository (10 mg total) rectally as needed for moderate constipation.   cholecalciferol 25 MCG (1000 UNIT) tablet Commonly known as: VITAMIN D3 Take 1,000 Units by mouth in the morning.   metoprolol tartrate 50 MG tablet Commonly known as: LOPRESSOR Take 1.5 tablets (75 mg total) by mouth 2 (two) times daily. What changed: how much to take   omeprazole 20 MG capsule Commonly known as: PRILOSEC TAKE 1 CAPSULE  EVERY DAY   sennosides-docusate sodium 8.6-50 MG tablet Commonly known as: SENOKOT-S Take 1 tablet by mouth every morning.   simvastatin 20 MG tablet Commonly known as: ZOCOR TAKE 1 TABLET EVERY DAY What changed: when to take this   torsemide 20 MG tablet Commonly known as: DEMADEX Take 1 tablet (20 mg total) by mouth 2 (two) times daily.        Follow-up Information     Sharlene Dory, NP Follow up on 05/13/2023.   Specialty: Cardiology Why: Cardiology Hospital Follow-up on 05/13/2023 at 3:30 PM with Sharlene Dory, NP (works with Dr. Antoine Poche). Will be at the [facility] office. Contact information: [address] [place] [address] [phone]         Sonny Masters, FNP. Schedule an appointment as soon as possible for a visit in 10 day(s).   Specialty: Family Medicine Contact information: [address] [place] [address] [phone]                Discharge Exam: Ceasar Mons Weights   04/16/23 2222 04/17/23 0500 04/18/23 0427  Weight: 49.5 kg 48.3 kg 47.8 kg   General exam: Alert, awake, oriented x 3; denying chest pain and palpitations.  Chronically ill in appearance.  Afebrile. Respiratory system: Clear to auscultation. Respiratory effort normal.  Good  saturation on room air.  No using accessory muscle. Cardiovascular system: Rate controlled, no rubs, no gallops, no JVD. Gastrointestinal system: Abdomen is nondistended, soft and nontender. No organomegaly or masses felt. Normal bowel sounds heard. Central nervous system: No focal neurological deficits. Extremities: No cyanosis or clubbing. Skin: No petechiae. Psychiatry: Judgement and insight appear normal. Mood & affect appropriate.   Condition at discharge: Stable and improved.  The results of significant diagnostics from this hospitalization (including imaging, microbiology, ancillary and laboratory) are listed below for reference.   Imaging Studies: ECHOCARDIOGRAM COMPLETE  Result Date:  04/17/2023    ECHOCARDIOGRAM REPORT   Patient Name:   MALAUN MACEDA Date of Exam: 04/17/2023 Medical Rec #:  [id number]   Height:       56.0 in Accession #:    [id number]  Weight:       106.5 lb Date of Birth:  Jun 14, 1941   BSA:          1.358 m Patient Age:    82 years    BP:           137/79 mmHg Patient Gender: F           HR:           76 bpm. [facility] Procedure: 2D Echo, Cardiac Doppler and Color Doppler Indications:    Atrial Fibrillation I48.91  History:        Patient has prior history of Echocardiogram examinations, most                 recent 09/09/2020. CHF, Arrythmias:Atrial Fibrillation; Risk                 Factors:Hypertension and Dyslipidemia.  Sonographer:    Celesta Gentile RCS Referring Phys: [id number] ASIA B ZIERLE-GHOSH IMPRESSIONS  1. Left ventricular ejection fraction, by estimation, is approximately 55%. The left ventricle has normal function. The left ventricle has no regional wall motion abnormalities. There is mild asymmetric left ventricular hypertrophy of the basal segment.  Left ventricular diastolic parameters are indeterminate. There is the interventricular septum is flattened in diastole ('D' shaped left ventricle), consistent with right ventricular volume overload.  2. Right ventricular systolic function is mildly reduced. The right ventricular size is moderately enlarged. There is normal pulmonary artery systolic pressure. The estimated right ventricular systolic pressure is 30.1 mmHg.  3. Left atrial size was severely dilated.  4. Right atrial size was severely dilated.  5. The mitral valve is grossly normal. Mild mitral valve regurgitation.  6. Tricuspid valve mildly thickened with incomplete coaptation likely related to annular dilatation. The tricuspid valve is abnormal. Tricuspid valve regurgitation is severe.  7. The aortic valve is tricuspid. Aortic valve regurgitation is mild. Aortic valve sclerosis/calcification is present, without any evidence of aortic stenosis.   8. The inferior vena cava is dilated in size with >50% respiratory variability, suggesting right atrial pressure of 8 mmHg. Comparison(s): No significant change from prior study. Prior images reviewed side by side. FINDINGS  Left Ventricle: Left ventricular ejection fraction, by estimation, is 55%. The left ventricle has normal function. The left ventricle has no regional wall motion abnormalities. The left ventricular internal cavity size was normal in size. There is mild asymmetric left ventricular hypertrophy of the basal segment. The interventricular septum is flattened in diastole ('D' shaped left ventricle), consistent with right ventricular volume overload. Left ventricular diastolic function could not be evaluated due to atrial fibrillation. Left ventricular diastolic parameters are indeterminate. Right  Ventricle: The right ventricular size is moderately enlarged. No increase in right ventricular wall thickness. Right ventricular systolic function is mildly reduced. There is normal pulmonary artery systolic pressure. The tricuspid regurgitant velocity is 2.35 m/s, and with an assumed right atrial pressure of 8 mmHg, the estimated right ventricular systolic pressure is 30.1 mmHg. Left Atrium: Left atrial size was severely dilated. Right Atrium: Right atrial size was severely dilated. Pericardium: There is no evidence of pericardial effusion. Mitral Valve: The mitral valve is grossly normal. Mild mitral annular calcification. Mild mitral valve regurgitation. Tricuspid Valve: Tricuspid valve mildly thickened with incomplete coaptation likely related to annular dilatation. The tricuspid valve is abnormal. Tricuspid valve regurgitation is severe. Aortic Valve: The aortic valve is tricuspid. There is mild aortic valve annular calcification. Aortic valve regurgitation is mild. Aortic regurgitation PHT measures 485 msec. Aortic valve sclerosis/calcification is present, without any evidence of aortic  stenosis.  Pulmonic Valve: The pulmonic valve was grossly normal. Pulmonic valve regurgitation is trivial. Aorta: The aortic root is normal in size and structure. Venous: The inferior vena cava is dilated in size with greater than 50% respiratory variability, suggesting right atrial pressure of 8 mmHg. IAS/Shunts: No atrial level shunt detected by color flow Doppler.  LEFT VENTRICLE PLAX 2D LVIDd:         3.70 cm LVIDs:         2.60 cm LV PW:         0.90 cm LV IVS:        1.20 cm LVOT diam:     1.70 cm LV SV:         37 LV SV Index:   27 LVOT Area:     2.27 cm  RIGHT VENTRICLE RV S prime:     9.14 cm/s TAPSE (M-mode): 1.6 cm LEFT ATRIUM             Index        RIGHT ATRIUM           Index LA diam:        3.70 cm 2.72 cm/m   RA Area:     25.40 cm LA Vol (A2C):   64.7 ml 47.64 ml/m  RA Volume:   91.40 ml  67.31 ml/m LA Vol (A4C):   75.1 ml 55.30 ml/m LA Biplane Vol: 74.1 ml 54.57 ml/m  AORTIC VALVE LVOT Vmax:   87.70 cm/s LVOT Vmean:  57.250 cm/s LVOT VTI:    0.164 m AI PHT:      485 msec  AORTA Ao Root diam: 3.20 cm MITRAL VALVE                  TRICUSPID VALVE MV Area (PHT): 4.80 cm       TR Peak grad:   22.1 mmHg MV Decel Time: 158 msec       TR Vmax:        235.00 cm/s MR Peak grad:    92.9 mmHg MR Mean grad:    56.0 mmHg    SHUNTS MR Vmax:         482.00 cm/s  Systemic VTI:  0.16 m MR Vmean:        343.0 cm/s   Systemic Diam: 1.70 cm MR PISA:         1.01 cm MR PISA Eff ROA: 5 mm MR PISA Radius:  0.40 cm MV E velocity: 89.10 cm/s Nona Dell MD Electronically signed by Nona Dell MD Signature Date/Time: 04/17/2023/2:02:27 PM  Final    DG Chest Port 1 View  Result Date: 04/16/2023 CLINICAL DATA:  Shortness of breath EXAM: PORTABLE CHEST 1 VIEW COMPARISON:  08/12/2021 FINDINGS: Stable enlargement of the cardiomediastinal silhouette. Aortic atherosclerotic calcification. Low lung volumes accentuate pulmonary vascularity. Bibasilar atelectasis. No pleural effusion or pneumothorax. No displaced rib  fractures. IMPRESSION: Low lung volumes with basilar atelectasis. Electronically Signed   By: Minerva Fester M.D.   On: 04/16/2023 19:35    Microbiology: Results for orders placed or performed during the hospital encounter of 04/16/23  MRSA Next Gen by PCR, Nasal     Status: None   Collection Time: 04/16/23 10:30 PM   Specimen: Nasal Mucosa; Nasal Swab  Result Value Ref Range Status   MRSA by PCR Next Gen NOT DETECTED NOT DETECTED Final    Comment: (NOTE) The GeneXpert MRSA Assay (FDA approved for NASAL specimens only), is one component of a comprehensive MRSA colonization surveillance program. It is not intended to diagnose MRSA infection nor to guide or monitor treatment for MRSA infections. Test performance is not FDA approved in patients less than 108 years old. Performed at [facility], [address] [place], [place] [address]     Labs: CBC: Recent Labs  Lab 04/16/23 1906 04/17/23 0314  WBC 9.1 8.2  NEUTROABS 7.0 5.8  HGB 14.5 14.6  HCT 43.7 44.5  MCV 97.8 98.9  PLT 133* 139*   Basic Metabolic Panel: Recent Labs  Lab 04/16/23 1906 04/16/23 2034 04/17/23 0314 04/19/23 0412  NA 137  --  141 139  K 3.3*  --  4.3 3.7  CL 100  --  103 103  CO2 24  --  28 26  GLUCOSE 170*  --  110* 99  BUN 28*  --  20 14  CREATININE 1.03*  --  0.83 0.67  CALCIUM 8.3*  --  8.9 9.0  MG  --  2.1 2.3 2.3   Liver Function Tests: Recent Labs  Lab 04/16/23 1906 04/17/23 0314  AST 34 29  ALT 21 19  ALKPHOS 103 91  BILITOT 0.8 0.8  PROT 7.2 6.7  ALBUMIN 4.0 3.7    Discharge time spent: greater than 30 minutes.  Signed: Vassie Loll, MD [facility] 04/19/2023

## 2023-04-20 ENCOUNTER — Telehealth: Payer: Self-pay | Admitting: Cardiology

## 2023-04-20 ENCOUNTER — Telehealth: Payer: Self-pay

## 2023-04-20 ENCOUNTER — Encounter: Payer: Self-pay | Admitting: Family Medicine

## 2023-04-20 ENCOUNTER — Ambulatory Visit (INDEPENDENT_AMBULATORY_CARE_PROVIDER_SITE_OTHER): Payer: Medicare HMO | Admitting: Family Medicine

## 2023-04-20 VITALS — BP 115/84 | HR 88 | Temp 97.1°F | Ht <= 58 in | Wt 106.0 lb

## 2023-04-20 DIAGNOSIS — E876 Hypokalemia: Secondary | ICD-10-CM | POA: Diagnosis not present

## 2023-04-20 DIAGNOSIS — I5032 Chronic diastolic (congestive) heart failure: Secondary | ICD-10-CM | POA: Diagnosis not present

## 2023-04-20 DIAGNOSIS — I2510 Atherosclerotic heart disease of native coronary artery without angina pectoris: Secondary | ICD-10-CM

## 2023-04-20 DIAGNOSIS — I4891 Unspecified atrial fibrillation: Secondary | ICD-10-CM | POA: Diagnosis not present

## 2023-04-20 DIAGNOSIS — I110 Hypertensive heart disease with heart failure: Secondary | ICD-10-CM | POA: Diagnosis not present

## 2023-04-20 NOTE — Telephone Encounter (Signed)
OK to cancel as patient did just have one ?

## 2023-04-20 NOTE — Telephone Encounter (Signed)
Echo lab is calling to see if upcoming echo appt is needed. Caller stated the family of patient called to cancel, but he would like to double check to make sure it is no longer needed since patient had echo recently done. He states that appt can be canceled if not needed.

## 2023-04-20 NOTE — Telephone Encounter (Signed)
Unable to reach pt or leave a message, echo does not need to be repeated on 05/26/23, they had an echo during recent hospital admission.

## 2023-04-20 NOTE — Telephone Encounter (Signed)
Spoke with pt, aware echo has been cancelled.

## 2023-04-20 NOTE — Transitions of Care (Post Inpatient/ED Visit) (Signed)
   04/20/2023  Name: RESHUNDA STRIDER MRN: [id number] DOB: Dec 01, 1940  Today's TOC FU Call Status: Today's TOC FU Call Status:: Unsuccessul Call (1st Attempt) Unsuccessful Call (1st Attempt) Date: 04/20/23  Attempted to reach the patient regarding the most recent Inpatient/ED visit.  Follow Up Plan: Additional outreach attempts will be made to reach the patient to complete the Transitions of Care (Post Inpatient/ED visit) call.   Jodelle Gross, RN, BSN, CCM Care Management Coordinator [place] Phone: [phone]/Fax: [phone]

## 2023-04-20 NOTE — Patient Instructions (Signed)
Weight: Contact provider for 3 pound weight gain in one day or 5 pound weight gain in one week.

## 2023-04-20 NOTE — Progress Notes (Signed)
Subjective:  Patient ID: Norma Barajas, female    DOB: 09-01-1941, 82 y.o.   MRN: [id number]  Patient Care Team: Sonny Masters, FNP as PCP - General (Family Medicine) Rollene Rotunda, MD as PCP - Cardiology (Cardiology) West Bali, MD (Inactive) as Attending Physician (Gastroenterology) Derryl Harbor, OD as Consulting Physician (Optometry) Newman Nip, NP (Inactive) as Consulting Physician (Cardiology)   Chief Complaint:  Hospitalization Follow-up (04/16/2023 - 04/19/2023 (3 days)/[facility]-  & FL2 paper work 0)   HPI: Norma Barajas is a 82 y.o. female presenting on 04/20/2023 for Hospitalization Follow-up (04/16/2023 - 04/19/2023 (3 days)/[facility]-  & FL2 paper work 0)   Pt presents today for hospital discharge follow up and for assistance with paperwork for assisted living placement. She was admitted for A-Fib RVR and has done well since being home. She is compliant with her medications and denies adverse side effects. States her heart rate has been well controlled since being home. No chest pain, palpitations, shortness of breath, or confusion. No syncope or falls. She was noted to be hypokalemic during admission and was given potassium during stay. Has been eating bananas at home. Lasix was changed to torsemide and she feels she is tolerating well. No leg swelling, chest pain, orthopnea, PND, or significant weight changes.  She does have gait instability, has to use a walker. She needs assistance with medications, bathing, and getting to the restroom at times. She can feed herself and is continent of urine and stool. She is active daily. She feels she is doing well.     Relevant past medical, surgical, family, and social history reviewed and updated as indicated.  Allergies and medications reviewed and updated. Data reviewed: Chart in Epic.   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 Nonobstructive minimal CAD 10/2005.   Depression    Diastolic dysfunction    Grade 1. Ejection fraction 60-65%.   Dysrhythmia    a fib   Erosive esophagitis    Essential hypertension    Fracture of ramus of right pubis with routine healing 12/09/2020   GERD (gastroesophageal reflux disease)    Grade III hemorrhoids    History of hiatal hernia    History of prediabetes    Hypercholesterolemia    Internal hemorrhoids with complication 07/29/2008   OCT 2015 FLEX SIG/IH BANDING     Intertrochanteric fracture of femur (HCC)    5/16 - 03/26/2021 fracture sustained in a mechanical fall.  IM nailing performed 5/17 by Dr Thane Edu Warfarin held because of posttraumatic pelvic hematoma.  Postop DVT with 81 mg aspirin twice daily x28 days.   Major depressive disorder, recurrent, moderate (HCC) 12/09/2020   Migraine    "used to have them right bad; I don't now" (04/11/2015)   MITRAL REGURGITATION 04/27/2010   Qualifier: Diagnosis of  By: Eden Emms, MD, Harrington Challenger    Osteoporosis    Paroxysmal atrial fibrillation [facility])    Pelvic hematoma, female 03/27/2021   Sustained in mechanical fall.  Warfarin held.  IM nailing performed 5/17 with postop DVT prophylaxis 81 mg twice daily x28 days.   PONV (postoperative nausea and vomiting)    Vertigo  Past Surgical History:  Procedure Laterality Date   BRAVO [facility] STUDY  11/15/2012   Procedure: BRAVO PH STUDY;  Surgeon: West Bali, MD;  Location: [facility]O [facility];  Service: Endoscopy;;   CARDIOVERSION N/A 05/16/2020   Procedure: CARDIOVERSION;  Surgeon: Little Ishikawa, MD;  Location: [facility] ENDOSCOPY;  Service: Cardiovascular;  Laterality: N/A;   CATARACT EXTRACTION W/ INTRAOCULAR LENS  IMPLANT, BILATERAL Bilateral    COLONOSCOPY  2008   Dr. Darrick Penna: internal hemorrhoids    DILATION AND  CURETTAGE OF UTERUS     ESOPHAGEAL DILATION N/A 06/16/2021   Procedure: ESOPHAGEAL DILATION;  Surgeon: Dolores Frame, MD;  Location: [facility] ENDO [facility]ITE;  Service: Gastroenterology;  Laterality: N/A;   ESOPHAGOGASTRODUODENOSCOPY (EGD) WITH ESOPHAGEAL DILATION  2001   Dr. Arlyce Dice: erosive esophagitis, esophageal stricture, duodenitis, s/p Savary dilation   ESOPHAGOGASTRODUODENOSCOPY (EGD) WITH ESOPHAGEAL DILATION  11/15/2012   [id number] web was found & MOST LIKELY CAUSE FOR DYAPHAGIA/Polyp was found in the gastric body and gastric fundus/ gastritis on bx   ESOPHAGOGASTRODUODENOSCOPY (EGD) WITH PROPOFOL N/A 06/16/2021   Procedure: ESOPHAGOGASTRODUODENOSCOPY (EGD) WITH PROPOFOL;  Surgeon: Dolores Frame, MD;  Location: [facility];  Service: Gastroenterology;  Laterality: N/A;  8:15   EYE SURGERY Bilateral    "laser OR after cataract OR; cause I couldn't see"   FLEXIBLE SIGMOIDOSCOPY N/A 08/22/2014   mild diverticulosis in sigmoid, moderate sized Grade 3 hemorrhoids s/p banding X 3.    FRACTURE SURGERY Right    below the knee - 2 bones broke has plates   HEMORRHOID BANDING N/A 08/22/2014   Procedure: HEMORRHOID BANDING;  Surgeon: West Bali, MD;  Location: [facility];  Service: Endoscopy;  Laterality: N/A;   HEMORRHOID SURGERY N/A 03/30/2018   Procedure: EXTENSIVE HEMORRHOIDECTOMY;  Surgeon: Lucretia Roers, MD;  Location: [facility] ORS;  Service: General;  Laterality: N/A;   INTRAMEDULLARY (IM) NAIL INTERTROCHANTERIC Right 03/24/2021   Procedure: INTRAMEDULLARY (IM) NAIL INTERTROCHANTRIC;  Surgeon: Oliver Barre, MD;  Location: [facility] ORS;  Service: Orthopedics;  Laterality: Right;   OPEN REDUCTION INTERNAL FIXATION (ORIF) TIBIA/FIBULA FRACTURE Right 2013   broke tibia and fibula after falling down stairs   PROLAPSED UTERINE FIBROID LIGATION  2015   TOTAL ABDOMINAL HYSTERECTOMY      Social History   Socioeconomic History   Marital status: Widowed    Spouse name: george     Number of children: 1   Years of education: Not on file   Highest education level: Not on file  Occupational History   Occupation: Retired    Comment: Textile  Tobacco Use   Smoking status: Never   Smokeless tobacco: Never  Vaping Use   Vaping Use: Never used  Substance and Sexual Activity   Alcohol use: No    Alcohol/week: 0.0 standard drinks of alcohol   Drug use: No   Sexual activity: Not Currently    Birth control/protection: Surgical, Post-menopausal    Comment: hyst  Other Topics Concern   Not on file  Social History Narrative   Married   No regular exercise   Social Determinants of Health   Financial Resource Strain: Not on file  Food Insecurity: No Food Insecurity (04/16/2023)   Hunger Vital Sign    Worried About Running Out of Food in the Last Year: Never true    Ran Out of Food in the Last Year: Never true  Transportation Needs: No Transportation Needs (04/16/2023)   PRAPARE - Transportation    Lack of Transportation (  Medical): No    Lack of Transportation (Non-Medical): No  Physical Activity: Not on file  Stress: Not on file  Social Connections: Not on file  Intimate Partner Violence: Not At Risk (04/16/2023)   Humiliation, Afraid, Rape, and Kick questionnaire    Fear of Current or Ex-Partner: No    Emotionally Abused: No    Physically Abused: No    Sexually Abused: No    Outpatient Encounter Medications as of 04/20/2023  Medication Sig   acetaminophen (TYLENOL) 650 MG CR tablet Take 1,300 mg by mouth every 6 (six) hours. Displaced intertrochanteric fracture of right femur   amLODipine (NORVASC) 5 MG tablet Take 0.5 tablets (2.5 mg total) by mouth daily.   ASPIRIN LOW DOSE 81 MG tablet TAKE 1 TABLET TWICE DAILY (Patient taking differently: Take 81 mg by mouth every morning.)   bisacodyl (DULCOLAX) 10 MG suppository Place 1 suppository (10 mg total) rectally as needed for moderate constipation.   cholecalciferol (VITAMIN D3) 25 MCG (1000 UNIT) tablet Take 1,000  Units by mouth in the morning.   metoprolol tartrate (LOPRESSOR) 50 MG tablet Take 1.5 tablets (75 mg total) by mouth 2 (two) times daily.   omeprazole (PRILOSEC) 20 MG capsule TAKE 1 CAPSULE EVERY DAY   sennosides-docusate sodium (SENOKOT-S) 8.6-50 MG tablet Take 1 tablet by mouth every morning.   simvastatin (ZOCOR) 20 MG tablet TAKE 1 TABLET EVERY DAY (Patient taking differently: Take 20 mg by mouth daily at 6 PM.)   torsemide (DEMADEX) 20 MG tablet Take 1 tablet (20 mg total) by mouth 2 (two) times daily.   No facility-administered encounter medications on file as of 04/20/2023.    Allergies  Allergen Reactions   Codeine Nausea And Vomiting   Doxycycline Other (See Comments)    Chest congestion   Triamterene-Hctz Other (See Comments)    weakness   Atorvastatin Other (See Comments)    Myalgias    Crestor [Rosuvastatin Calcium] Other (See Comments)    weakness   Morphine Nausea Only   Risedronate Sodium Other (See Comments)    ACTONEL - reflux    Review of Systems  Constitutional:  Positive for activity change. Negative for appetite change, chills, diaphoresis, fatigue, fever and unexpected weight change.  Eyes:  Negative for photophobia and visual disturbance.  Respiratory:  Negative for cough and shortness of breath.   Cardiovascular:  Negative for chest pain, palpitations and leg swelling.  Genitourinary:  Negative for decreased urine volume and difficulty urinating.  Musculoskeletal:  Positive for arthralgias, back pain and gait problem.  Neurological:  Positive for weakness. Negative for dizziness, tremors, seizures, syncope, facial asymmetry, speech difficulty, light-headedness, numbness and headaches.  Psychiatric/Behavioral:  Negative for confusion.   All other systems reviewed and are negative.       Objective:  BP 115/84   Pulse 88   Temp (!) 97.1 F (36.2 C) (Temporal)   Ht 4\' 8"  (1.422 m)   Wt 106 lb (48.1 kg)   SpO2 94%   BMI 23.76 kg/m    Wt Readings  from Last 3 Encounters:  04/20/23 106 lb (48.1 kg)  04/18/23 105 lb 6.1 oz (47.8 kg)  04/13/23 107 lb (48.5 kg)    Physical Exam Vitals and nursing note reviewed.  Constitutional:      General: She is not in acute distress.    Appearance: Normal appearance. She is well-developed and well-groomed. She is not ill-appearing, toxic-appearing or diaphoretic.  HENT:     Head: Normocephalic and atraumatic.  Jaw: There is normal jaw occlusion.     Right Ear: Hearing normal.     Left Ear: Hearing normal.     Nose: Nose normal.     Mouth/Throat:     Lips: Pink.     Mouth: Mucous membranes are moist.     Pharynx: Oropharynx is clear. Uvula midline.  Eyes:     General: Lids are normal.     Extraocular Movements: Extraocular movements intact.     Conjunctiva/sclera: Conjunctivae normal.     Pupils: Pupils are equal, round, and reactive to light.  Neck:     Thyroid: No thyroid mass, thyromegaly or thyroid tenderness.     Vascular: Carotid bruit present. No JVD.     Trachea: Trachea and phonation normal.  Cardiovascular:     Rate and Rhythm: Normal rate. Rhythm irregularly irregular.     Chest Wall: PMI is not displaced.     Pulses: Normal pulses.     Heart sounds: Murmur heard.     Systolic murmur is present with a grade of 3/6.     No friction rub. No gallop.  Pulmonary:     Effort: Pulmonary effort is normal. No respiratory distress.     Breath sounds: Normal breath sounds. No wheezing.  Abdominal:     General: Bowel sounds are normal. There is no distension or abdominal bruit.     Palpations: Abdomen is soft. There is no hepatomegaly or splenomegaly.     Tenderness: There is no abdominal tenderness. There is no right CVA tenderness or left CVA tenderness.     Hernia: No hernia is present.  Musculoskeletal:     Cervical back: Normal range of motion and neck supple.     Right lower leg: No edema.     Left lower leg: No edema.     Comments: Kyphosis  Lymphadenopathy:      Cervical: No cervical adenopathy.  Skin:    General: Skin is warm and dry.     Capillary Refill: Capillary refill takes less than 2 seconds.     Coloration: Skin is not cyanotic, jaundiced or pale.     Findings: No rash.  Neurological:     General: No focal deficit present.     Mental Status: She is alert and oriented to person, place, and time.     Sensory: Sensation is intact.     Motor: Motor function is intact.     Coordination: Coordination is intact.     Gait: Gait abnormal (slow, using walker).     Deep Tendon Reflexes: Reflexes are normal and symmetric.  Psychiatric:        Attention and Perception: Attention and perception normal.        Mood and Affect: Mood and affect normal.        Speech: Speech normal.        Behavior: Behavior normal. Behavior is cooperative.        Thought Content: Thought content normal.        Cognition and Memory: Cognition and memory normal.        Judgment: Judgment normal.     Results for orders placed or performed during the hospital encounter of 04/16/23  MRSA Next Gen by PCR, Nasal   Specimen: Nasal Mucosa; Nasal Swab  Result Value Ref Range   MRSA by PCR Next Gen NOT DETECTED NOT DETECTED  CBC with Differential  Result Value Ref Range   WBC 9.1 4.0 - 10.5 K/uL   RBC  4.47 3.87 - 5.11 MIL/uL   Hemoglobin 14.5 12.0 - 15.0 g/dL   HCT 16.1 09.6 - 04.5 %   MCV 97.8 80.0 - 100.0 fL   MCH 32.4 26.0 - 34.0 pg   MCHC 33.2 30.0 - 36.0 g/dL   RDW 40.9 81.1 - 91.4 %   Platelets 133 (L) 150 - 400 K/uL   nRBC 0.0 0.0 - 0.2 %   Neutrophils Relative % 78 %   Neutro Abs 7.0 1.7 - 7.7 K/uL   Lymphocytes Relative 13 %   Lymphs Abs 1.2 0.7 - 4.0 K/uL   Monocytes Relative 8 %   Monocytes Absolute 0.8 0.1 - 1.0 K/uL   Eosinophils Relative 1 %   Eosinophils Absolute 0.1 0.0 - 0.5 K/uL   Basophils Relative 0 %   Basophils Absolute 0.0 0.0 - 0.1 K/uL   Immature Granulocytes 0 %   Abs Immature Granulocytes 0.04 0.00 - 0.07 K/uL  Comprehensive  metabolic panel  Result Value Ref Range   Sodium 137 135 - 145 mmol/L   Potassium 3.3 (L) 3.5 - 5.1 mmol/L   Chloride 100 98 - 111 mmol/L   CO2 24 22 - 32 mmol/L   Glucose, Bld 170 (H) 70 - 99 mg/dL   BUN 28 (H) 8 - 23 mg/dL   Creatinine, Ser 7.82 (H) 0.44 - 1.00 mg/dL   Calcium 8.3 (L) 8.9 - 10.3 mg/dL   Total Protein 7.2 6.5 - 8.1 g/dL   Albumin 4.0 3.5 - 5.0 g/dL   AST 34 15 - 41 U/L   ALT 21 0 - 44 U/L   Alkaline Phosphatase 103 38 - 126 U/L   Total Bilirubin 0.8 0.3 - 1.2 mg/dL   GFR, Estimated 54 (L) >60 mL/min   Anion gap 13 5 - 15  Magnesium  Result Value Ref Range   Magnesium 2.1 1.7 - 2.4 mg/dL  Hemoglobin N5A  Result Value Ref Range   Hgb A1c MFr Bld 5.4 4.8 - 5.6 %   Mean Plasma Glucose 108.28 mg/dL  Comprehensive metabolic panel  Result Value Ref Range   Sodium 141 135 - 145 mmol/L   Potassium 4.3 3.5 - 5.1 mmol/L   Chloride 103 98 - 111 mmol/L   CO2 28 22 - 32 mmol/L   Glucose, Bld 110 (H) 70 - 99 mg/dL   BUN 20 8 - 23 mg/dL   Creatinine, Ser 2.13 0.44 - 1.00 mg/dL   Calcium 8.9 8.9 - 08.6 mg/dL   Total Protein 6.7 6.5 - 8.1 g/dL   Albumin 3.7 3.5 - 5.0 g/dL   AST 29 15 - 41 U/L   ALT 19 0 - 44 U/L   Alkaline Phosphatase 91 38 - 126 U/L   Total Bilirubin 0.8 0.3 - 1.2 mg/dL   GFR, Estimated >57 >84 mL/min   Anion gap 10 5 - 15  CBC with Differential/Platelet  Result Value Ref Range   WBC 8.2 4.0 - 10.5 K/uL   RBC 4.50 3.87 - 5.11 MIL/uL   Hemoglobin 14.6 12.0 - 15.0 g/dL   HCT 69.6 29.5 - 28.4 %   MCV 98.9 80.0 - 100.0 fL   MCH 32.4 26.0 - 34.0 pg   MCHC 32.8 30.0 - 36.0 g/dL   RDW 13.2 44.0 - 10.2 %   Platelets 139 (L) 150 - 400 K/uL   nRBC 0.0 0.0 - 0.2 %   Neutrophils Relative % 71 %   Neutro Abs 5.8 1.7 - 7.7 K/uL  Lymphocytes Relative 17 %   Lymphs Abs 1.4 0.7 - 4.0 K/uL   Monocytes Relative 11 %   Monocytes Absolute 0.9 0.1 - 1.0 K/uL   Eosinophils Relative 0 %   Eosinophils Absolute 0.0 0.0 - 0.5 K/uL   Basophils Relative 1 %    Basophils Absolute 0.1 0.0 - 0.1 K/uL   Immature Granulocytes 0 %   Abs Immature Granulocytes 0.02 0.00 - 0.07 K/uL  TSH  Result Value Ref Range   TSH 1.283 0.350 - 4.500 uIU/mL  Magnesium  Result Value Ref Range   Magnesium 2.3 1.7 - 2.4 mg/dL  Basic metabolic panel  Result Value Ref Range   Sodium 139 135 - 145 mmol/L   Potassium 3.7 3.5 - 5.1 mmol/L   Chloride 103 98 - 111 mmol/L   CO2 26 22 - 32 mmol/L   Glucose, Bld 99 70 - 99 mg/dL   BUN 14 8 - 23 mg/dL   Creatinine, Ser 1.32 0.44 - 1.00 mg/dL   Calcium 9.0 8.9 - 44.0 mg/dL   GFR, Estimated >10 >27 mL/min   Anion gap 10 5 - 15  Magnesium  Result Value Ref Range   Magnesium 2.3 1.7 - 2.4 mg/dL  ECHOCARDIOGRAM COMPLETE  Result Value Ref Range   Weight 1,703.71 oz   Height 56 in   BP 135/69 mmHg   Area-P 1/2 4.80 cm2   S' Lateral 2.60 cm   MV M vel 4.82 m/s   MV Peak grad 92.9 mmHg   P 1/2 time 485 msec   Radius 0.40 cm   Est EF 55   Troponin I (High Sensitivity)  Result Value Ref Range   Troponin I (High Sensitivity) 4 <18 ng/L  Troponin I (High Sensitivity)  Result Value Ref Range   Troponin I (High Sensitivity) 4 <18 ng/L  Troponin I (High Sensitivity)  Result Value Ref Range   Troponin I (High Sensitivity) 5 <18 ng/L       Pertinent labs & imaging results that were available during my care of the patient were reviewed by me and considered in my medical decision making.  Assessment & Plan:  Valentine was seen today for hospitalization follow-up.  Diagnoses and all orders for this visit:  Atrial fibrillation with RVR (HCC) Rate is controlled, on ASA only. Will repeat labs today. Follow up with cardiology as scheduled.  -     CMP14+EGFR -     CBC with Differential/Platelet -     Brain natriuretic peptide  Hypertensive heart disease with chronic diastolic congestive heart failure (HCC) Seems to be doing well on torsemide. Appears euvolemic in office. Will check labs today. Asymptomatic.  -      CMP14+EGFR -     CBC with Differential/Platelet -     Brain natriuretic peptide  Atherosclerosis of native coronary artery of native heart without angina pectoris Compliant with medications. Denies anginal symptoms.    Hypokalemia Will repeat labs today.  -     CMP14+EGFR  FL2 form was brought in with pt, will complete this and get back to pt.    Continue all other maintenance medications.  Follow up plan: Return in about 3 months (around 07/21/2023) for chronic follow up.   Continue healthy lifestyle choices, including diet (rich in fruits, vegetables, and lean proteins, and low in salt and simple carbohydrates) and exercise (at least 30 minutes of moderate physical activity daily).   The above assessment and management plan was discussed with the patient. The  patient verbalized understanding of and has agreed to the management plan. Patient is aware to call the clinic if they develop any new symptoms or if symptoms persist or worsen. Patient is aware when to return to the clinic for a follow-up visit. Patient educated on when it is appropriate to go to the emergency department.   Kari Baars, FNP-C [facility] [phone]

## 2023-04-21 ENCOUNTER — Telehealth: Payer: Self-pay

## 2023-04-21 LAB — CBC WITH DIFFERENTIAL/PLATELET
Basophils Absolute: 0.1 10*3/uL (ref 0.0–0.2)
Basos: 1 %
EOS (ABSOLUTE): 0.1 10*3/uL (ref 0.0–0.4)
Eos: 2 %
Hematocrit: 44.7 % (ref 34.0–46.6)
Hemoglobin: 15.3 g/dL (ref 11.1–15.9)
Immature Grans (Abs): 0 10*3/uL (ref 0.0–0.1)
Immature Granulocytes: 0 %
Lymphocytes Absolute: 1.4 10*3/uL (ref 0.7–3.1)
Lymphs: 21 %
MCH: 33.2 pg — ABNORMAL HIGH (ref 26.6–33.0)
MCHC: 34.2 g/dL (ref 31.5–35.7)
MCV: 97 fL (ref 79–97)
Monocytes Absolute: 0.7 10*3/uL (ref 0.1–0.9)
Monocytes: 11 %
Neutrophils Absolute: 4.5 10*3/uL (ref 1.4–7.0)
Neutrophils: 65 %
Platelets: 147 10*3/uL — ABNORMAL LOW (ref 150–450)
RBC: 4.61 x10E6/uL (ref 3.77–5.28)
RDW: 13 % (ref 11.7–15.4)
WBC: 6.8 10*3/uL (ref 3.4–10.8)

## 2023-04-21 LAB — CMP14+EGFR
ALT: 14 IU/L (ref 0–32)
AST: 22 IU/L (ref 0–40)
Albumin/Globulin Ratio: 1.7
Albumin: 4.4 g/dL (ref 3.7–4.7)
Alkaline Phosphatase: 127 IU/L — ABNORMAL HIGH (ref 44–121)
BUN/Creatinine Ratio: 22 (ref 12–28)
BUN: 25 mg/dL (ref 8–27)
Bilirubin Total: 0.4 mg/dL (ref 0.0–1.2)
CO2: 24 mmol/L (ref 20–29)
Calcium: 9.4 mg/dL (ref 8.7–10.3)
Chloride: 96 mmol/L (ref 96–106)
Creatinine, Ser: 1.15 mg/dL — ABNORMAL HIGH (ref 0.57–1.00)
Globulin, Total: 2.6 g/dL (ref 1.5–4.5)
Glucose: 87 mg/dL (ref 70–99)
Potassium: 4 mmol/L (ref 3.5–5.2)
Sodium: 139 mmol/L (ref 134–144)
Total Protein: 7 g/dL (ref 6.0–8.5)
eGFR: 47 mL/min/{1.73_m2} — ABNORMAL LOW (ref >59)

## 2023-04-21 LAB — BRAIN NATRIURETIC PEPTIDE: BNP: 275.8 pg/mL — ABNORMAL HIGH (ref 0.0–100.0)

## 2023-04-21 NOTE — Transitions of Care (Post Inpatient/ED Visit) (Signed)
   04/21/2023  Name: Norma Barajas MRN: [id number] DOB: 01-10-1941  Today's TOC FU Call Status: Today's TOC FU Call Status:: Unsuccessful Call (2nd Attempt) Unsuccessful Call (2nd Attempt) Date: 04/21/23  Attempted to reach the patient regarding the most recent Inpatient/ED visit.  Follow Up Plan: Additional outreach attempts will be made to reach the patient to complete the Transitions of Care (Post Inpatient/ED visit) call.   Jodelle Gross, RN, BSN, CCM Care Management Coordinator [place] Phone: [phone]/Fax: [phone]

## 2023-04-22 ENCOUNTER — Telehealth: Payer: Self-pay

## 2023-04-22 ENCOUNTER — Ambulatory Visit: Payer: Medicare HMO | Admitting: Family Medicine

## 2023-04-22 DIAGNOSIS — Z111 Encounter for screening for respiratory tuberculosis: Secondary | ICD-10-CM

## 2023-04-22 LAB — READ PPD: TB Skin Test: NEGATIVE

## 2023-04-22 NOTE — Transitions of Care (Post Inpatient/ED Visit) (Signed)
04/22/2023  Name: Norma Barajas MRN: [id number] DOB: August 02, 1941  Today's TOC FU Call Status: Today's TOC FU Call Status:: Successful TOC FU Call Competed TOC FU Call Complete Date: 04/22/23  Transition Care Management Follow-up Telephone Call Date of Discharge: 04/19/23 Type of Discharge: Inpatient Admission Primary Inpatient Discharge Diagnosis:: Atrial Fibrillation How have you been since you were released from the hospital?: Better Any questions or concerns?: No  Items Reviewed: Did you receive and understand the discharge instructions provided?: Yes Medications obtained,verified, and reconciled?: Yes (Medications Reviewed) Any new allergies since your discharge?: No Dietary orders reviewed?: No Do you have support at home?: Yes People in Home: other relative(s) Name of Support/Comfort Primary Source: Niece-Norma Barajas  Medications Reviewed Today: Medications Reviewed Today     Reviewed by Norma Gross, RN (Case Manager) on 04/22/23 at 1031  Med List Status: <None>   Medication Order Taking? Sig Documenting Provider Last Dose Status Informant  acetaminophen (TYLENOL) 650 MG CR tablet [id number]  Take 1,300 mg by mouth every 6 (six) hours. Displaced intertrochanteric fracture of right femur [provider]  Active Self  amLODipine (NORVASC) 5 MG tablet [id number] Yes Take 0.5 tablets (2.5 mg total) by mouth daily. Vassie Loll, MD Taking Active   ASPIRIN LOW DOSE 81 MG tablet [id number] Yes TAKE 1 TABLET TWICE DAILY  Patient taking differently: Take 81 mg by mouth every morning.   Gwenlyn Fudge, FNP Taking Active Self  bisacodyl (DULCOLAX) 10 MG suppository [id number] No Place 1 suppository (10 mg total) rectally as needed for moderate constipation. Gwenlyn Fudge, FNP Unknown Active Self  cholecalciferol (VITAMIN D3) 25 MCG (1000 UNIT) tablet [id number] Yes Take 1,000 Units by mouth in the morning. [provider] Taking Active Self  metoprolol tartrate  (LOPRESSOR) 50 MG tablet [id number] Yes Take 1.5 tablets (75 mg total) by mouth 2 (two) times daily. Vassie Loll, MD Taking Active   omeprazole (PRILOSEC) 20 MG capsule [id number] Yes TAKE 1 CAPSULE EVERY DAY Barajas, Norma Albino, FNP Taking Active Self  sennosides-docusate sodium (SENOKOT-S) 8.6-50 MG tablet [id number] No Take 1 tablet by mouth every morning. Vassie Loll, MD Unknown Active   simvastatin (ZOCOR) 20 MG tablet [id number] Yes TAKE 1 TABLET EVERY DAY  Patient taking differently: Take 20 mg by mouth daily at 6 PM.   Barajas, Norma Albino, FNP Taking Active Self  torsemide (DEMADEX) 20 MG tablet [id number] Yes Take 1 tablet (20 mg total) by mouth 2 (two) times daily. Vassie Loll, MD Taking Active             Home Care and Equipment/Supplies: Were Home Health Services Ordered?: No Any new equipment or medical supplies ordered?: No  Functional Questionnaire: Do you need assistance with bathing/showering or dressing?: No Do you need assistance with meal preparation?: No Do you need assistance with eating?: No Do you have difficulty maintaining continence: No Do you need assistance with getting out of bed/getting out of a chair/moving?: No Do you have difficulty managing or taking your medications?: No  Follow up appointments reviewed: PCP Follow-up appointment confirmed?: Yes Date of PCP follow-up appointment?: 04/22/23 Follow-up Provider: Gilford Silvius, FNP Specialist Hospital Follow-up appointment confirmed?: Yes Date of Specialist follow-up appointment?: 05/13/23 Follow-Up Specialty Provider:: Sharlene Dory Do you need transportation to your follow-up appointment?: No Do you understand care options if your condition(s) worsen?: Yes-patient verbalized understanding  SDOH Interventions Today    Flowsheet Row Most Recent Value  SDOH Interventions   Food Insecurity Interventions Intervention Not Indicated  Transportation Interventions Intervention Not Indicated      Norma Gross, RN, BSN, CCM Care Management Coordinator /[facility] Phone: [phone]/Fax: [phone]

## 2023-04-27 ENCOUNTER — Emergency Department (HOSPITAL_COMMUNITY): Payer: Medicare HMO

## 2023-04-27 ENCOUNTER — Observation Stay (HOSPITAL_COMMUNITY)
Admission: EM | Admit: 2023-04-27 | Discharge: 2023-04-28 | Disposition: A | Discharge status: Home Health | Payer: Medicare HMO | Attending: Family Medicine | Admitting: Family Medicine

## 2023-04-27 ENCOUNTER — Other Ambulatory Visit: Payer: Self-pay

## 2023-04-27 DIAGNOSIS — R079 Chest pain, unspecified: Secondary | ICD-10-CM | POA: Diagnosis not present

## 2023-04-27 DIAGNOSIS — I480 Paroxysmal atrial fibrillation: Principal | ICD-10-CM | POA: Insufficient documentation

## 2023-04-27 DIAGNOSIS — I2510 Atherosclerotic heart disease of native coronary artery without angina pectoris: Secondary | ICD-10-CM | POA: Diagnosis not present

## 2023-04-27 DIAGNOSIS — I5032 Chronic diastolic (congestive) heart failure: Secondary | ICD-10-CM | POA: Diagnosis not present

## 2023-04-27 DIAGNOSIS — Z7982 Long term (current) use of aspirin: Secondary | ICD-10-CM | POA: Insufficient documentation

## 2023-04-27 DIAGNOSIS — Z9181 History of falling: Secondary | ICD-10-CM | POA: Insufficient documentation

## 2023-04-27 DIAGNOSIS — R0689 Other abnormalities of breathing: Secondary | ICD-10-CM | POA: Diagnosis not present

## 2023-04-27 DIAGNOSIS — Z86711 Personal history of pulmonary embolism: Secondary | ICD-10-CM | POA: Insufficient documentation

## 2023-04-27 DIAGNOSIS — R2681 Unsteadiness on feet: Secondary | ICD-10-CM | POA: Insufficient documentation

## 2023-04-27 DIAGNOSIS — I10 Essential (primary) hypertension: Secondary | ICD-10-CM | POA: Diagnosis present

## 2023-04-27 DIAGNOSIS — I110 Hypertensive heart disease with heart failure: Secondary | ICD-10-CM | POA: Insufficient documentation

## 2023-04-27 DIAGNOSIS — R5381 Other malaise: Secondary | ICD-10-CM | POA: Diagnosis not present

## 2023-04-27 DIAGNOSIS — I4891 Unspecified atrial fibrillation: Secondary | ICD-10-CM | POA: Diagnosis not present

## 2023-04-27 DIAGNOSIS — Z79899 Other long term (current) drug therapy: Secondary | ICD-10-CM | POA: Diagnosis not present

## 2023-04-27 DIAGNOSIS — R000 Tachycardia, unspecified: Secondary | ICD-10-CM | POA: Diagnosis not present

## 2023-04-27 DIAGNOSIS — M6281 Muscle weakness (generalized): Secondary | ICD-10-CM | POA: Insufficient documentation

## 2023-04-27 DIAGNOSIS — R2689 Other abnormalities of gait and mobility: Secondary | ICD-10-CM | POA: Diagnosis not present

## 2023-04-27 DIAGNOSIS — R0789 Other chest pain: Secondary | ICD-10-CM | POA: Diagnosis present

## 2023-04-27 LAB — BASIC METABOLIC PANEL
Anion gap: 11 (ref 5–15)
BUN: 32 mg/dL — ABNORMAL HIGH (ref 8–23)
CO2: 27 mmol/L (ref 22–32)
Calcium: 8.5 mg/dL — ABNORMAL LOW (ref 8.9–10.3)
Chloride: 97 mmol/L — ABNORMAL LOW (ref 98–111)
Creatinine, Ser: 1.06 mg/dL — ABNORMAL HIGH (ref 0.44–1.00)
GFR, Estimated: 52 mL/min — ABNORMAL LOW (ref >60)
Glucose, Bld: 188 mg/dL — ABNORMAL HIGH (ref 70–99)
Potassium: 3.8 mmol/L (ref 3.5–5.1)
Sodium: 135 mmol/L (ref 135–145)

## 2023-04-27 LAB — URINALYSIS, ROUTINE W REFLEX MICROSCOPIC
Bilirubin Urine: NEGATIVE
Glucose, UA: NEGATIVE mg/dL
Hgb urine dipstick: NEGATIVE
Ketones, ur: NEGATIVE mg/dL
Leukocytes,Ua: NEGATIVE
Nitrite: NEGATIVE
Protein, ur: NEGATIVE mg/dL
Specific Gravity, Urine: 1.006 (ref 1.005–1.030)
pH: 5 (ref 5.0–8.0)

## 2023-04-27 LAB — TROPONIN I (HIGH SENSITIVITY)
Troponin I (High Sensitivity): 4 ng/L (ref <18)
Troponin I (High Sensitivity): 5 ng/L (ref <18)

## 2023-04-27 LAB — HEPATIC FUNCTION PANEL
ALT: 19 U/L (ref 0–44)
AST: 29 U/L (ref 15–41)
Albumin: 3.7 g/dL (ref 3.5–5.0)
Alkaline Phosphatase: 92 U/L (ref 38–126)
Bilirubin, Direct: 0.1 mg/dL (ref 0.0–0.2)
Indirect Bilirubin: 0.6 mg/dL (ref 0.3–0.9)
Total Bilirubin: 0.7 mg/dL (ref 0.3–1.2)
Total Protein: 6.8 g/dL (ref 6.5–8.1)

## 2023-04-27 LAB — CBC
HCT: 43.6 % (ref 36.0–46.0)
Hemoglobin: 14.3 g/dL (ref 12.0–15.0)
MCH: 32.5 pg (ref 26.0–34.0)
MCHC: 32.8 g/dL (ref 30.0–36.0)
MCV: 99.1 fL (ref 80.0–100.0)
Platelets: 149 10*3/uL — ABNORMAL LOW (ref 150–400)
RBC: 4.4 MIL/uL (ref 3.87–5.11)
RDW: 13.3 % (ref 11.5–15.5)
WBC: 7 10*3/uL (ref 4.0–10.5)
nRBC: 0 % (ref 0.0–0.2)

## 2023-04-27 LAB — PHOSPHORUS: Phosphorus: 3.3 mg/dL (ref 2.5–4.6)

## 2023-04-27 LAB — MAGNESIUM: Magnesium: 2.1 mg/dL (ref 1.7–2.4)

## 2023-04-27 MED ORDER — SIMVASTATIN 20 MG PO TABS: 20.0000 mg | ORAL_TABLET | Freq: Every day | ORAL | Status: DC

## 2023-04-27 MED ORDER — SENNA-DOCUSATE SODIUM 8.6-50 MG PO TABS: 1.0000 | ORAL_TABLET | Freq: Every morning | ORAL | Status: DC

## 2023-04-27 MED ORDER — SODIUM CHLORIDE 0.9 % IV SOLN: 250.0000 mL | INTRAVENOUS | Status: DC | PRN

## 2023-04-27 MED ORDER — POTASSIUM CHLORIDE CRYS ER 20 MEQ PO TBCR
20.0000 meq | EXTENDED_RELEASE_TABLET | Freq: Once | ORAL | Status: AC
  Administered 2023-04-27: 20 meq via ORAL
  Filled 2023-04-27: qty 1

## 2023-04-27 MED ORDER — METOPROLOL TARTRATE 50 MG PO TABS: 100.0000 mg | ORAL_TABLET | Freq: Two times a day (BID) | ORAL | Status: DC

## 2023-04-27 MED ORDER — SODIUM CHLORIDE 0.9% FLUSH: 3.0000 mL | INTRAVENOUS | Status: DC | PRN

## 2023-04-27 MED ORDER — SODIUM CHLORIDE 0.9% FLUSH
3.0000 mL | Freq: Two times a day (BID) | INTRAVENOUS | Status: DC
  Administered 2023-04-27 – 2023-04-28 (×2): 3 mL via INTRAVENOUS

## 2023-04-27 MED ORDER — ACETAMINOPHEN 325 MG PO TABS
650.0000 mg | ORAL_TABLET | ORAL | Status: DC | PRN
  Administered 2023-04-28: 650 mg via ORAL
  Filled 2023-04-27: qty 2

## 2023-04-27 MED ORDER — SENNOSIDES-DOCUSATE SODIUM 8.6-50 MG PO TABS
1.0000 | ORAL_TABLET | Freq: Every morning | ORAL | Status: DC
  Administered 2023-04-28: 1 via ORAL
  Filled 2023-04-27: qty 1

## 2023-04-27 MED ORDER — ASPIRIN 81 MG PO TBEC
81.0000 mg | DELAYED_RELEASE_TABLET | Freq: Every morning | ORAL | Status: DC
  Administered 2023-04-28: 81 mg via ORAL
  Filled 2023-04-27: qty 1

## 2023-04-27 MED ORDER — ACETAMINOPHEN ER 650 MG PO TBCR: 1300.0000 mg | EXTENDED_RELEASE_TABLET | Freq: Four times a day (QID) | ORAL | Status: DC

## 2023-04-27 MED ORDER — METOPROLOL TARTRATE 50 MG PO TABS
100.0000 mg | ORAL_TABLET | Freq: Two times a day (BID) | ORAL | Status: DC
  Administered 2023-04-27 – 2023-04-28 (×2): 100 mg via ORAL
  Filled 2023-04-27 (×2): qty 2

## 2023-04-27 MED ORDER — ENSURE ENLIVE PO LIQD: 237.0000 mL | Freq: Two times a day (BID) | ORAL | Status: DC

## 2023-04-27 MED ORDER — BISACODYL 10 MG RE SUPP: 10.0000 mg | RECTAL | Status: DC | PRN

## 2023-04-27 MED ORDER — PANTOPRAZOLE SODIUM 40 MG PO TBEC
40.0000 mg | DELAYED_RELEASE_TABLET | Freq: Every day | ORAL | Status: DC
  Administered 2023-04-27 – 2023-04-28 (×2): 40 mg via ORAL
  Filled 2023-04-27 (×2): qty 1

## 2023-04-27 MED ORDER — HEPARIN SODIUM (PORCINE) 5000 UNIT/ML IJ SOLN
5000.0000 [IU] | Freq: Two times a day (BID) | INTRAMUSCULAR | Status: DC
  Administered 2023-04-27 – 2023-04-28 (×2): 5000 [IU] via SUBCUTANEOUS
  Filled 2023-04-27 (×2): qty 1

## 2023-04-27 MED ORDER — LISINOPRIL 10 MG PO TABS
10.0000 mg | ORAL_TABLET | Freq: Every day | ORAL | Status: DC
  Administered 2023-04-28: 10 mg via ORAL
  Filled 2023-04-27: qty 1

## 2023-04-27 MED ORDER — ONDANSETRON HCL 4 MG/2ML IJ SOLN: 4.0000 mg | Freq: Four times a day (QID) | INTRAMUSCULAR | Status: DC | PRN

## 2023-04-27 MED ORDER — METOPROLOL TARTRATE 5 MG/5ML IV SOLN: 5.0000 mg | INTRAVENOUS | Status: DC | PRN

## 2023-04-27 NOTE — ED Notes (Signed)
ED TO INPATIENT HANDOFF REPORT  ED Nurse Name and Phone #: Elnita Maxwell [phone]  S Name/Age/Gender Norma Barajas 82 y.o. female Room/Bed: APA02/APA02  Code Status   Code Status: DNR  Home/SNF/Other Home Patient oriented to: self, place, time, and situation Is this baseline? Yes   Triage Complete: Triage complete  Chief Complaint Afib [facility]) [I48.91]  Triage Note Pt arrived [facility] for c/o chest pain. Upon assessment [facility] noted pt HR about 164. Pt also received 400 cc of NS in 18 G IV in LAC. HR 79-100 now. Pt states she is not on Afib meds but was here last week for it.    Allergies Allergies  Allergen Reactions   Codeine Nausea And Vomiting   Doxycycline Other (See Comments)    Chest congestion   Triamterene-Hctz Other (See Comments)    weakness   Atorvastatin Other (See Comments)    Myalgias    Crestor [Rosuvastatin Calcium] Other (See Comments)    weakness   Morphine Nausea Only   Risedronate Sodium Other (See Comments)    ACTONEL - reflux    Level of Care/Admitting Diagnosis ED Disposition     ED Disposition  Admit   Condition  --   Comment  Hospital Area: [facility] [[id number]]  Level of Care: Telemetry [5]  Covid Evaluation: Asymptomatic - no recent exposure (last 10 days) testing not required  Diagnosis: Afib [facility]) [[id number]]  Admitting Physician: Emeline General [[id number]]  Attending Physician: Emeline General [[id number]]          B Medical/Surgery History Past Medical History:  Diagnosis Date   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 Nonobstructive minimal CAD 10/2005.   Depression    Diastolic dysfunction    Grade 1. Ejection fraction 60-65%.   Dysrhythmia    a fib   Erosive esophagitis    Essential hypertension    Fracture of ramus of right pubis with  routine healing 12/09/2020   GERD (gastroesophageal reflux disease)    Grade III hemorrhoids    History of hiatal hernia    History of prediabetes    Hypercholesterolemia    Internal hemorrhoids with complication 07/29/2008   OCT 2015 FLEX SIG/IH BANDING     Intertrochanteric fracture of femur (HCC)    5/16 - 03/26/2021 fracture sustained in a mechanical fall.  IM nailing performed 5/17 by Dr Thane Edu Warfarin held because of posttraumatic pelvic hematoma.  Postop DVT with 81 mg aspirin twice daily x28 days.   Major depressive disorder, recurrent, moderate (HCC) 12/09/2020   Migraine    "used to have them right bad; I don't now" (04/11/2015)   MITRAL REGURGITATION 04/27/2010   Qualifier: Diagnosis of  By: Eden Emms, MD, Harrington Challenger    Osteoporosis    Paroxysmal atrial fibrillation [facility])    Pelvic hematoma, female 03/27/2021   Sustained in mechanical fall.  Warfarin held.  IM nailing performed 5/17 with postop DVT prophylaxis 81 mg twice daily x28 days.   PONV (postoperative nausea and vomiting)    Vertigo    Past Surgical History:  Procedure Laterality Date   BRAVO PH STUDY  11/15/2012   Procedure: BRAVO PH STUDY;  Surgeon: West Bali, MD;  [facility];  Service: Endoscopy;;   CARDIOVERSION  N/A 05/16/2020   Procedure: CARDIOVERSION;  Surgeon: Little Ishikawa, MD;  Location: [facility] ENDOSCOPY;  Service: Cardiovascular;  Laterality: N/A;   CATARACT EXTRACTION W/ INTRAOCULAR LENS  IMPLANT, BILATERAL Bilateral    COLONOSCOPY  2008   Dr. Darrick Penna: internal hemorrhoids    DILATION AND CURETTAGE OF UTERUS     ESOPHAGEAL DILATION N/A 06/16/2021   Procedure: ESOPHAGEAL DILATION;  Surgeon: Dolores Frame, MD;  Location: [facility];  Service: Gastroenterology;  Laterality: N/A;   ESOPHAGOGASTRODUODENOSCOPY (EGD) WITH ESOPHAGEAL DILATION  2001   Dr. Arlyce Dice: erosive esophagitis, esophageal stricture, duodenitis, s/p Savary dilation   ESOPHAGOGASTRODUODENOSCOPY  (EGD) WITH ESOPHAGEAL DILATION  11/15/2012   [id number] web was found & MOST LIKELY CAUSE FOR DYAPHAGIA/Polyp was found in the gastric body and gastric fundus/ gastritis on bx   ESOPHAGOGASTRODUODENOSCOPY (EGD) WITH PROPOFOL N/A 06/16/2021   Procedure: ESOPHAGOGASTRODUODENOSCOPY (EGD) WITH PROPOFOL;  Surgeon: Dolores Frame, MD;  [facility];  Service: Gastroenterology;  Laterality: N/A;  8:15   EYE SURGERY Bilateral    "laser OR after cataract OR; cause I couldn't see"   FLEXIBLE SIGMOIDOSCOPY N/A 08/22/2014   mild diverticulosis in sigmoid, moderate sized Grade 3 hemorrhoids s/p banding X 3.    FRACTURE SURGERY Right    below the knee - 2 bones broke has plates   HEMORRHOID BANDING N/A 08/22/2014   Procedure: HEMORRHOID BANDING;  Surgeon: West Bali, MD;  [facility];  Service: Endoscopy;  Laterality: N/A;   HEMORRHOID SURGERY N/A 03/30/2018   Procedure: EXTENSIVE HEMORRHOIDECTOMY;  Surgeon: Lucretia Roers, MD;  Location: [facility];  Service: General;  Laterality: N/A;   INTRAMEDULLARY (IM) NAIL INTERTROCHANTERIC Right 03/24/2021   Procedure: INTRAMEDULLARY (IM) NAIL INTERTROCHANTRIC;  Surgeon: Oliver Barre, MD;  Location: [facility];  Service: Orthopedics;  Laterality: Right;   OPEN REDUCTION INTERNAL FIXATION (ORIF) TIBIA/FIBULA FRACTURE Right 2013   broke tibia and fibula after falling down stairs   PROLAPSED UTERINE FIBROID LIGATION  2015   TOTAL ABDOMINAL HYSTERECTOMY       A IV Location/Drains/Wounds Patient Lines/Drains/Airways Status     Active Line/Drains/Airways     Name Placement date Placement time Site Days   Peripheral IV 04/27/23 18 G Left Antecubital 04/27/23  1424  Antecubital  less than 1            Intake/Output Last 24 hours No intake or output data in the 24 hours ending 04/27/23 1947  Labs/Imaging Results for orders placed or performed during the hospital encounter of 04/27/23 (from the past 48 hour(s))  Basic  metabolic panel     Status: Abnormal   Collection Time: 04/27/23  3:08 PM  Result Value Ref Range   Sodium 135 135 - 145 mmol/L   Potassium 3.8 3.5 - 5.1 mmol/L   Chloride 97 (L) 98 - 111 mmol/L   CO2 27 22 - 32 mmol/L   Glucose, Bld 188 (H) 70 - 99 mg/dL    Comment: Glucose reference range applies only to samples taken after fasting for at least 8 hours.   BUN 32 (H) 8 - 23 mg/dL   Creatinine, Ser 1.19 (H) 0.44 - 1.00 mg/dL   Calcium 8.5 (L) 8.9 - 10.3 mg/dL   GFR, Estimated 52 (L) >60 mL/min    Comment: (NOTE) Calculated using the CKD-EPI Creatinine Equation (2021)    Anion gap 11 5 - 15    Comment: Performed at [facility], [address] [place], [place] [address]  CBC     Status: Abnormal   Collection Time: 04/27/23  3:08 PM  Result Value Ref Range   WBC 7.0 4.0 - 10.5 K/uL   RBC 4.40 3.87 - 5.11 MIL/uL   Hemoglobin 14.3 12.0 - 15.0 g/dL   HCT 16.1 09.6 - 04.5 %   MCV 99.1 80.0 - 100.0 fL   MCH 32.5 26.0 - 34.0 pg   MCHC 32.8 30.0 - 36.0 g/dL   RDW 40.9 81.1 - 91.4 %   Platelets 149 (L) 150 - 400 K/uL   nRBC 0.0 0.0 - 0.2 %    Comment: Performed at [facility], [address], [place], [place] [address]  Troponin I (High Sensitivity)     Status: None   Collection Time: 04/27/23  3:08 PM  Result Value Ref Range   Troponin I (High Sensitivity) 4 <18 ng/L    Comment: (NOTE) Elevated high sensitivity troponin I (hsTnI) values and significant  changes across serial measurements may suggest ACS but many other  chronic and acute conditions are known to elevate hsTnI results.  Refer to the "Links" section for chest pain algorithms and additional  guidance. Performed at [facility], [address], [place], [place] [address]   Hepatic function panel     Status: None   Collection Time: 04/27/23  3:08 PM  Result Value Ref Range   Total Protein 6.8 6.5 - 8.1 g/dL   Albumin 3.7 3.5 - 5.0 g/dL   AST 29 15 - 41 U/L   ALT 19 0 - 44 U/L   Alkaline Phosphatase 92 38 - 126 U/L    Total Bilirubin 0.7 0.3 - 1.2 mg/dL   Bilirubin, Direct 0.1 0.0 - 0.2 mg/dL   Indirect Bilirubin 0.6 0.3 - 0.9 mg/dL    Comment: Performed at [facility], [address], [place], [place] [address]  Urinalysis, Routine w reflex microscopic -Urine, Clean Catch     Status: Abnormal   Collection Time: 04/27/23  3:19 PM  Result Value Ref Range   Color, Urine STRAW (A) YELLOW   APPearance CLEAR CLEAR   Specific Gravity, Urine 1.006 1.005 - 1.030   pH 5.0 5.0 - 8.0   Glucose, UA NEGATIVE NEGATIVE mg/dL   Hgb urine dipstick NEGATIVE NEGATIVE   Bilirubin Urine NEGATIVE NEGATIVE   Ketones, ur NEGATIVE NEGATIVE mg/dL   Protein, ur NEGATIVE NEGATIVE mg/dL   Nitrite NEGATIVE NEGATIVE   Leukocytes,Ua NEGATIVE NEGATIVE    Comment: Performed at [facility], [address] [place], [place] [address]  Troponin I (High Sensitivity)     Status: None   Collection Time: 04/27/23  4:45 PM  Result Value Ref Range   Troponin I (High Sensitivity) 5 <18 ng/L    Comment: (NOTE) Elevated high sensitivity troponin I (hsTnI) values and significant  changes across serial measurements may suggest ACS but many other  chronic and acute conditions are known to elevate hsTnI results.  Refer to the "Links" section for chest pain algorithms and additional  guidance. Performed at [facility], [address], [place], [place] [address]    DG Chest 2 View  Result Date: 04/27/2023 CLINICAL DATA:  Chest pain EXAM: CHEST - 2 VIEW COMPARISON:  04/16/2023 FINDINGS: Enlarged cardiopericardial silhouette. No consolidation, pneumothorax or effusion. No edema. Calcified aorta. Overlapping cardiac leads. Osteopenia with degenerative changes. Calcifications along the tracheobronchial tree. IMPRESSION: Enlarged heart.  No consolidation. Electronically Signed   By: Karen Kays M.D.   On: 04/27/2023 15:33  Pending Labs Unresulted Labs (From admission, onward)     Start     Ordered   04/28/23 0500  Basic metabolic panel   Daily,   R     Comments: As Scheduled for 5 days    04/27/23 1913   04/27/23 1914  Magnesium  Add-on,   AD        04/27/23 1913   04/27/23 1914  Phosphorus  Add-on,   AD        04/27/23 1913            Vitals/Pain Today's Vitals   04/27/23 1815 04/27/23 1830 04/27/23 1834 04/27/23 1845  BP: (!) 140/80 136/83  (!) 160/91  Pulse: 87 86  87  Resp: 17 (!) 22  (!) 22  Temp:   98.2 F (36.8 C)   TempSrc:   Oral   SpO2: 95% 95%  95%  Weight:      Height:      PainSc:        Isolation Precautions No active isolations  Medications Medications  aspirin EC tablet 81 mg (has no administration in time range)  metoprolol tartrate (LOPRESSOR) injection 5 mg (has no administration in time range)  simvastatin (ZOCOR) tablet 20 mg (has no administration in time range)  bisacodyl (DULCOLAX) suppository 10 mg (has no administration in time range)  pantoprazole (PROTONIX) EC tablet 40 mg (40 mg Oral Given 04/27/23 1937)  sodium chloride flush (NS) 0.9 % injection 3 mL (has no administration in time range)  sodium chloride flush (NS) 0.9 % injection 3 mL (has no administration in time range)  0.9 %  sodium chloride infusion (has no administration in time range)  acetaminophen (TYLENOL) tablet 650 mg (has no administration in time range)  ondansetron (ZOFRAN) injection 4 mg (has no administration in time range)  heparin injection 5,000 Units (has no administration in time range)  senna-docusate (Senokot-S) tablet 1 tablet (has no administration in time range)  metoprolol tartrate (LOPRESSOR) tablet 100 mg (100 mg Oral Given 04/27/23 1936)  potassium chloride SA (KLOR-CON M) CR tablet 20 mEq (20 mEq Oral Given 04/27/23 1937)    Mobility walks with device     R Recommendations: See Admitting Provider Note  Report given to: Rebecca Eaton, LPN  Additional Notes: A&O x4, pt will lift for bedpan, like to crush her pills d/t difficulty swallowing whole pills, uses a walker at home, HR  80-100, otherwise VSS.

## 2023-04-27 NOTE — ED Notes (Signed)
Pt uses bed pan to urinate

## 2023-04-27 NOTE — H&P (Signed)
History and Physical    Norma Barajas NGE:[id number] DOB: Nov 22, 1940 DOA: 04/27/2023  PCP: Sonny Masters, FNP (Confirm with patient/family/NH records and if not entered, this has to be entered at [facility] point of entry) Patient coming from: Home  I have personally briefly reviewed patient's old medical records in [facility]  Chief Complaint: Palpitations, lightheadedness  HPI: Norma Barajas is a 82 y.o. female with medical history significant of acoustic neuroma with hearing impairment, PAF not on systemic anticoagulation due to history of frequent falls, HTN, chronic HFpEF, GERD, HLD, presented with palpitations and rapid A-fib.  Patient was recently hospitalized last week for similar presentation with breakthrough rapid A-fib, was treated with IV Cardizem drip and she was started on metoprolol 75 mg twice daily on discharge.  Over the last week, patient has been compliant with her new medication metoprolol but she feels occasional palpitations.  This morning after a hot shower, patient became very exorcist and started to feel strong palpitations and lightheadedness.  She called EMS, EMS arrived and found her heart rate 164 and hypotensive.  En route to [facility] ED, she was given 400 cc of IV fluid and heart rate improved to lower 100.  She denies any chest pain leg swelling cough, no fever or chills no urinary complaints.  ED Course: Telemetry monitoring showed in and out of A-fib with heart rate ranging from 80-1 20s.  Blood pressure elevated SBP 150-160s.  Chest x-ray showed no acute infiltrates.  Blood work showed K3.8 creatinine 1.0 hemoglobin 14.3.  EKG showed A-fib with RVR.  Review of Systems: As per HPI otherwise 14 point review of systems negative.     Past Medical History:  Diagnosis Date   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   Right ear    Anxiety    Arthritis    "right leg" (04/11/2015)   BPPV (benign paroxysmal positional vertigo) 03/23/2016   Cataract    Coronary  atherosclerosis of native coronary artery    a. Nonobstructive minimal CAD 10/2005.   Depression    Diastolic dysfunction    Grade 1. Ejection fraction 60-65%.   Dysrhythmia    a fib   Erosive esophagitis    Essential hypertension    Fracture of ramus of right pubis with routine healing 12/09/2020   GERD (gastroesophageal reflux disease)    Grade III hemorrhoids    History of hiatal hernia    History of prediabetes    Hypercholesterolemia    Internal hemorrhoids with complication 07/29/2008   OCT 2015 FLEX SIG/IH BANDING     Intertrochanteric fracture of femur (HCC)    5/16 - 03/26/2021 fracture sustained in a mechanical fall.  IM nailing performed 5/17 by Dr Thane Edu Warfarin held because of posttraumatic pelvic hematoma.  Postop DVT with 81 mg aspirin twice daily x28 days.   Major depressive disorder, recurrent, moderate (HCC) 12/09/2020   Migraine    "used to have them right bad; I don't now" (04/11/2015)   MITRAL REGURGITATION 04/27/2010   Qualifier: Diagnosis of  By: Eden Emms, MD, Harrington Challenger    Osteoporosis    Paroxysmal atrial fibrillation [facility])    Pelvic hematoma, female 03/27/2021   Sustained in mechanical fall.  Warfarin held.  IM nailing performed 5/17 with postop DVT prophylaxis 81 mg twice daily x28 days.   PONV (postoperative nausea and vomiting)    Vertigo     Past Surgical History:  Procedure Laterality Date   BRAVO [facility] STUDY  11/15/2012  Procedure: BRAVO PH STUDY;  Surgeon: West Bali, MD;  Location: [facility] ENDO SUITE;  Service: Endoscopy;;   CARDIOVERSION N/A 05/16/2020   Procedure: CARDIOVERSION;  Surgeon: Little Ishikawa, MD;  Location: [facility] ENDOSCOPY;  Service: Cardiovascular;  Laterality: N/A;   CATARACT EXTRACTION W/ INTRAOCULAR LENS  IMPLANT, BILATERAL Bilateral    COLONOSCOPY  2008   Dr. Darrick Penna: internal hemorrhoids    DILATION AND CURETTAGE OF UTERUS     ESOPHAGEAL DILATION N/A 06/16/2021   Procedure: ESOPHAGEAL DILATION;  Surgeon: Dolores Frame, MD;  Location: [facility] ENDO SUITE;  Service: Gastroenterology;  Laterality: N/A;   ESOPHAGOGASTRODUODENOSCOPY (EGD) WITH ESOPHAGEAL DILATION  2001   Dr. Arlyce Dice: erosive esophagitis, esophageal stricture, duodenitis, s/p Savary dilation   ESOPHAGOGASTRODUODENOSCOPY (EGD) WITH ESOPHAGEAL DILATION  11/15/2012   [id number] web was found & MOST LIKELY CAUSE FOR DYAPHAGIA/Polyp was found in the gastric body and gastric fundus/ gastritis on bx   ESOPHAGOGASTRODUODENOSCOPY (EGD) WITH PROPOFOL N/A 06/16/2021   Procedure: ESOPHAGOGASTRODUODENOSCOPY (EGD) WITH PROPOFOL;  Surgeon: Dolores Frame, MD;  Location: [facility] ENDO SUITE;  Service: Gastroenterology;  Laterality: N/A;  8:15   EYE SURGERY Bilateral    "laser OR after cataract OR; cause I couldn't see"   FLEXIBLE SIGMOIDOSCOPY N/A 08/22/2014   mild diverticulosis in sigmoid, moderate sized Grade 3 hemorrhoids s/p banding X 3.    FRACTURE SURGERY Right    below the knee - 2 bones broke has plates   HEMORRHOID BANDING N/A 08/22/2014   Procedure: HEMORRHOID BANDING;  Surgeon: West Bali, MD;  Location: [facility] END[facility] SUITE;  Service: Endoscopy;  Laterality: N/A;   HEMORRHOID SURGERY N/A 03/30/2018   Procedure: EXTENSIVE HEMORRHOIDECTOMY;  Surgeon: Lucretia Roers, MD;  Location: [facility] ORS;  Service: General;  Laterality: N/A;   INTRAMEDULLARY (IM) NAIL INTERTROCHANTERIC Right 03/24/2021   Procedure: INTRAMEDULLARY (IM) NAIL INTERTROCHANTRIC;  Surgeon: Oliver Barre, MD;  Location: [facility] ORS;  Service: Orthopedics;  Laterality: Right;   OPEN REDUCTION INTERNAL FIXATION (ORIF) TIBIA/FIBULA FRACTURE Right 2013   broke tibia and fibula after falling down stairs   PROLAPSED UTERINE FIBROID LIGATION  2015   TOTAL ABDOMINAL HYSTERECTOMY       reports that she has never smoked. She has never used smokeless tobacco. She reports that she does not drink alcohol and does not use drugs.  Allergies  Allergen Reactions   Codeine Nausea And Vomiting    Doxycycline Other (See Comments)    Chest congestion   Triamterene-Hctz Other (See Comments)    weakness   Atorvastatin Other (See Comments)    Myalgias    Crestor [Rosuvastatin Calcium] Other (See Comments)    weakness   Morphine Nausea Only   Risedronate Sodium Other (See Comments)    ACTONEL - reflux    Family History  Problem Relation Age of Onset   Colon cancer Mother 93   Heart disease Mother    Osteoporosis Mother    Hip fracture Mother    Stroke Sister    Diabetes Sister    Osteoporosis Sister    Arthritis Sister    Uterine cancer Sister    Stroke Sister    Heart disease Father    Hyperlipidemia Brother    Hypertension Brother    Heart disease Brother    Stroke Brother    Heart disease Sister    Dementia Sister    Diabetes Son    Stroke Son    Heart attack Neg Hx      Prior to  Admission medications   Medication Sig Start Date End Date Taking? Authorizing Provider  acetaminophen (TYLENOL) 650 MG CR tablet Take 1,300 mg by mouth every 6 (six) hours. Displaced intertrochanteric fracture of right femur    [provider]  amLODipine (NORVASC) 5 MG tablet Take 0.5 tablets (2.5 mg total) by mouth daily. 04/19/23   Vassie Loll, MD  ASPIRIN LOW DOSE 81 MG tablet TAKE 1 TABLET TWICE DAILY Patient taking differently: Take 81 mg by mouth every morning. 06/23/22   Gwenlyn Fudge, FNP  bisacodyl (DULCOLAX) 10 MG suppository Place 1 suppository (10 mg total) rectally as needed for moderate constipation. 01/06/22   Deliah Boston F, FNP  cholecalciferol (VITAMIN D3) 25 MCG (1000 UNIT) tablet Take 1,000 Units by mouth in the morning.    [provider]  metoprolol tartrate (LOPRESSOR) 50 MG tablet Take 1.5 tablets (75 mg total) by mouth 2 (two) times daily. 04/19/23   Vassie Loll, MD  omeprazole (PRILOSEC) 20 MG capsule TAKE 1 CAPSULE EVERY DAY 02/28/23   Sonny Masters, FNP  sennosides-docusate sodium (SENOKOT-S) 8.6-50 MG tablet Take 1 tablet by mouth  every morning. 04/19/23   Vassie Loll, MD  simvastatin (ZOCOR) 20 MG tablet TAKE 1 TABLET EVERY DAY Patient taking differently: Take 20 mg by mouth daily at 6 PM. 03/07/23   Rakes, Doralee Albino, FNP  torsemide (DEMADEX) 20 MG tablet Take 1 tablet (20 mg total) by mouth 2 (two) times daily. 04/19/23   Vassie Loll, MD    Physical Exam: Vitals:   04/27/23 1815 04/27/23 1830 04/27/23 1834 04/27/23 1845  BP: (!) 140/80 136/83  (!) 160/91  Pulse: 87 86  87  Resp: 17 (!) 22  (!) 22  Temp:   98.2 F (36.8 C)   TempSrc:   Oral   SpO2: 95% 95%  95%  Weight:      Height:        Constitutional: NAD, calm, comfortable Vitals:   04/27/23 1815 04/27/23 1830 04/27/23 1834 04/27/23 1845  BP: (!) 140/80 136/83  (!) 160/91  Pulse: 87 86  87  Resp: 17 (!) 22  (!) 22  Temp:   98.2 F (36.8 C)   TempSrc:   Oral   SpO2: 95% 95%  95%  Weight:      Height:       Eyes: PERRL, lids and conjunctivae normal ENMT: Mucous membranes are dry. Posterior pharynx clear of any exudate or lesions.Normal dentition.  Neck: normal, supple, no masses, no thyromegaly Respiratory: clear to auscultation bilaterally, no wheezing, no crackles. Normal respiratory effort. No accessory muscle use.  Cardiovascular: Irregular heart rate, no murmurs / rubs / gallops. No extremity edema. 2+ pedal pulses. No carotid bruits.  Abdomen: no tenderness, no masses palpated. No hepatosplenomegaly. Bowel sounds positive.  Musculoskeletal: no clubbing / cyanosis. No joint deformity upper and lower extremities. Good ROM, no contractures. Normal muscle tone.  Skin: no rashes, lesions, ulcers. No induration Neurologic: CN 2-12 grossly intact. Sensation intact, DTR normal. Strength 5/5 in all 4.  Psychiatric: Normal judgment and insight. Alert and oriented x 3. Normal mood.     Labs on Admission: I have personally reviewed following labs and imaging studies  CBC: Recent Labs  Lab 04/27/23 1508  WBC 7.0  HGB 14.3  HCT 43.6  MCV  99.1  PLT 149*   Basic Metabolic Panel: Recent Labs  Lab 04/27/23 1508  NA 135  K 3.8  CL 97*  CO2 27  GLUCOSE 188*  BUN 32*  CREATININE 1.06*  CALCIUM 8.5*   GFR: Estimated Creatinine Clearance: 25.6 mL/min (A) (by C-G formula based on SCr of 1.06 mg/dL (H)). Liver Function Tests: Recent Labs  Lab 04/27/23 1508  AST 29  ALT 19  ALKPHOS 92  BILITOT 0.7  PROT 6.8  ALBUMIN 3.7   No results for input(s): "LIPASE", "AMYLASE" in the last 168 hours. No results for input(s): "AMMONIA" in the last 168 hours. Coagulation Profile: No results for input(s): "INR", "PROTIME" in the last 168 hours. Cardiac Enzymes: No results for input(s): "CKTOTAL", "CKMB", "CKMBINDEX", "TROPONINI" in the last 168 hours. BNP (last 3 results) No results for input(s): "PROBNP" in the last 8760 hours. HbA1C: No results for input(s): "HGBA1C" in the last 72 hours. CBG: No results for input(s): "GLUCAP" in the last 168 hours. Lipid Profile: No results for input(s): "CHOL", "HDL", "LDLCALC", "TRIG", "CHOLHDL", "LDLDIRECT" in the last 72 hours. Thyroid Function Tests: No results for input(s): "TSH", "T4TOTAL", "FREET4", "T3FREE", "THYROIDAB" in the last 72 hours. Anemia Panel: No results for input(s): "VITAMINB12", "FOLATE", "FERRITIN", "TIBC", "IRON", "RETICCTPCT" in the last 72 hours. Urine analysis:    Component Value Date/Time   COLORURINE STRAW (A) 04/27/2023 1519   APPEARANCEUR CLEAR 04/27/2023 1519   APPEARANCEUR Clear 07/31/2020 1545   LABSPEC 1.006 04/27/2023 1519   PHURINE 5.0 04/27/2023 1519   GLUCOSEU NEGATIVE 04/27/2023 1519   HGBUR NEGATIVE 04/27/2023 1519   BILIRUBINUR NEGATIVE 04/27/2023 1519   BILIRUBINUR Negative 07/31/2020 1545   KETONESUR NEGATIVE 04/27/2023 1519   PROTEINUR NEGATIVE 04/27/2023 1519   UROBILINOGEN negative 02/06/2015 1252   NITRITE NEGATIVE 04/27/2023 1519   LEUKOCYTESUR NEGATIVE 04/27/2023 1519    Radiological Exams on Admission: DG Chest 2  View  Result Date: 04/27/2023 CLINICAL DATA:  Chest pain EXAM: CHEST - 2 VIEW COMPARISON:  04/16/2023 FINDINGS: Enlarged cardiopericardial silhouette. No consolidation, pneumothorax or effusion. No edema. Calcified aorta. Overlapping cardiac leads. Osteopenia with degenerative changes. Calcifications along the tracheobronchial tree. IMPRESSION: Enlarged heart.  No consolidation. Electronically Signed   By: Karen Kays M.D.   On: 04/27/2023 15:33    EKG: Independently reviewed.  A-fib with RVR, no acute ST changes.  Assessment/Plan Principal Problem:   Afib (HCC) Active Problems:   HTN (hypertension)   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)   Atrial fibrillation with RVR (HCC)  (please populate well all problems here in Problem List. (For example, if patient is on BP meds at home and you resume or decide to hold them, it is a problem that needs to be her. Same for CAD, COPD, HLD and so on)  A-fib with RVR -Initial encounter by EMS appears to indicate volume contraction as patient heart rate significantly improved with 400 cc of IV fluid.  Clinically patient appears to be on the dry side, will hold off p.o. torsemide tonight. -Her BP however still not well-controlled, decided to increase her metoprolol to 100 mg twice daily.  To compensate, will discontinue amlodipine.  Torsemide will be on hold as mentioned above -Continue aspirin, CHADS2=5, not on anticoagulation due to history of frequent falls.  As per cardiologist outpatient note not a candidate for Watchman procedure due to frail status. -Make K>4.0 and check Mg and Phos as patient on chronic diuresis. TSH checked recently.  History of pulmonary hypertension with cor pulmonale and reduced RV function -Probably will still need long-term diuresis/torsemide once volume status improved  HTN -Increase metoprolol, discontinue amlodipine.  Torsemide on hold.  CAD -  Continue aspirin and  simvastatin  GERD -Continue PPI  Deconditioning -PT evaluation -Discussed with niece/family at bedside, patient lives by herself and with deterioration of her physical strength, she is on the process to be moved to an assisted living facility.  For now we will consult case manager for possible home care/visiting nurse   DVT prophylaxis: Heparin subcu Code Status: DNR Family Communication: Niece at bedside Disposition Plan: Expect less than 2 midnight hospital stay Consults called: None Admission status: Tele obs   Emeline General MD [facility] Pager [phone]  04/27/2023, 7:15 PM

## 2023-04-27 NOTE — ED Triage Notes (Signed)
Pt arrived [facility] for c/o chest pain. Upon assessment [facility] noted pt HR about 164. Pt also received 400 cc of NS in 18 G IV in LAC. HR 79-100 now. Pt states she is not on Afib meds but was here last week for it.

## 2023-04-27 NOTE — Progress Notes (Signed)
HR more controlled, but BP started to run high, will start Lisinopril 10 mg. Consider resume Torsemide tomorrow if BP still runs high overnight.

## 2023-04-28 DIAGNOSIS — I4891 Unspecified atrial fibrillation: Secondary | ICD-10-CM

## 2023-04-28 DIAGNOSIS — I10 Essential (primary) hypertension: Secondary | ICD-10-CM

## 2023-04-28 DIAGNOSIS — I480 Paroxysmal atrial fibrillation: Secondary | ICD-10-CM | POA: Diagnosis not present

## 2023-04-28 LAB — BASIC METABOLIC PANEL
Anion gap: 12 (ref 5–15)
BUN: 19 mg/dL (ref 8–23)
CO2: 27 mmol/L (ref 22–32)
Calcium: 8.9 mg/dL (ref 8.9–10.3)
Chloride: 101 mmol/L (ref 98–111)
Creatinine, Ser: 0.71 mg/dL (ref 0.44–1.00)
GFR, Estimated: >60 mL/min (ref >60)
Glucose, Bld: 81 mg/dL (ref 70–99)
Potassium: 3.9 mmol/L (ref 3.5–5.1)
Sodium: 140 mmol/L (ref 135–145)

## 2023-04-28 MED ORDER — TORSEMIDE 20 MG PO TABS: 20.0000 mg | ORAL_TABLET | Freq: Two times a day (BID) | ORAL | 2 refills | Status: DC

## 2023-04-28 MED ORDER — SIMVASTATIN 20 MG PO TABS: 20.0000 mg | ORAL_TABLET | Freq: Every day | ORAL | Status: DC

## 2023-04-28 MED ORDER — METOPROLOL TARTRATE 100 MG PO TABS: 100.0000 mg | ORAL_TABLET | Freq: Two times a day (BID) | ORAL | 2 refills | Status: DC

## 2023-04-28 NOTE — Progress Notes (Signed)
Assisted pt. To bathroom. Pt. Did great with walker, was able to stand herself up. Pt. Now sitting in recliner, and comfortable.

## 2023-04-28 NOTE — TOC Initial Note (Signed)
Transition of Care [facility]) - Initial/Assessment Note    Patient Details  Name: Norma Barajas MRN: [id number] Date of Birth: 1941/01/20  Transition of [facility]) CM/SW Contact:    Elliot Gault, LCSW Phone Number: 04/28/2023, 10:11 AM  Clinical Narrative:                  Pt admitted from home. Per MD, will dc later today. Received TOC consults for HH/DME and HF screen.  Spoke with pt to complete assessment. Per pt, she will be returning home at dc while she awaits bed availability at [facility]. Pt has a RW for ambulation. She states that she does have a cardiologist and follows a heart healthy diet.  Offered HH/DME referral. Pt states she has the DME she needs. She is agreeable to [facility]. CMS provider options reviewed. Pt states she has used [facility] in the past and would like them again. Referral sent to [facility]. Information added to AVS.  No other TOC needs identified.  Expected Discharge Plan: Home w Home Health Services Barriers to Discharge: Barriers Resolved   Patient Goals and CMS Choice Patient states their goals for this hospitalization and ongoing recovery are:: return home until ALF bed available CMS Medicare.gov Compare Post Acute Care list provided to:: Patient Choice offered to / list presented to : Patient      Expected Discharge Plan and Services In-house Referral: Clinical Social Work   Post Acute Care Choice: Home Health Living arrangements for the past 2 months: Single Family Home                           HH Arranged: RN, PT, Nurse's Aide HH Agency: [facility] Date [facility] Agency Contacted: 04/28/23   Representative spoke with at [facility] Agency: Kandee Keen  Prior Living Arrangements/Services Living arrangements for the past 2 months: Single Family Home Lives with:: Self Patient language and need for interpreter reviewed:: Yes Do you feel safe going back to the place where you live?: Yes      Need for Family Participation in Patient Care: Yes  (Comment) Care giver support system in place?: Yes (comment) Current home services: DME Criminal Activity/Legal Involvement Pertinent to Current Situation/Hospitalization: No - Comment as needed  Activities of Daily Living Home Assistive Devices/Equipment: Eyeglasses, Dan Humphreys (specify type) ADL Screening (condition at time of admission) Patient's cognitive ability adequate to safely complete daily activities?: Yes Is the patient deaf or have difficulty hearing?: Yes Does the patient have difficulty seeing, even when wearing glasses/contacts?: No Does the patient have difficulty concentrating, remembering, or making decisions?: No Patient able to express need for assistance with ADLs?: Yes Does the patient have difficulty dressing or bathing?: No Independently performs ADLs?: Yes (appropriate for developmental age) Does the patient have difficulty walking or climbing stairs?: Yes Weakness of Legs: Both Weakness of Arms/Hands: None  Permission Sought/Granted Permission sought to share information with : Facility Industrial/product designer granted to share information with : Yes, Verbal Permission Granted     Permission granted to share info w AGENCY: [facility]        Emotional Assessment Appearance:: Appears stated age Attitude/Demeanor/Rapport: Engaged Affect (typically observed): Pleasant Orientation: : Oriented to Self, Oriented to Place, Oriented to  Time, Oriented to Situation Alcohol / Substance Use: Not Applicable Psych Involvement: No (comment)  Admission diagnosis:  Afib [facility]) [I48.91] Patient Active Problem List   Diagnosis Date Noted   Afib (HCC) 04/27/2023  Atrial fibrillation with RVR (HCC) 04/16/2023   Stage 3a chronic kidney disease (HCC) 11/03/2022   Iron deficiency 11/03/2022   Nonspecific abnormal electrocardiogram (ECG) (EKG) 08/29/2022   Abnormal SPEP 02/07/2022   Degenerative disc disease, lumbar 12/20/2021   Hypertensive heart disease with chronic  diastolic congestive heart failure (HCC) 03/27/2021   Gallbladder sludge 03/27/2021   Aortic atherosclerosis (HCC) 03/27/2021   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) 10/23/2020   Severe tricuspid valve regurgitation 10/23/2020   Hypokalemia 09/16/2020   Early satiety 07/15/2020   Difficulty sleeping 06/22/2019   Paroxysmal atrial fibrillation (HCC) 08/21/2015   Lumbar pain 08/07/2015   Drug induced constipation 12/14/2013   HTN (hypertension) 07/24/2013   Coronary atherosclerosis of native coronary artery 07/24/2013   Pulmonary nodule 02/18/2011   Hyperlipidemia 07/29/2008   GAD (generalized anxiety disorder) 07/29/2008   Primary hypertension 07/29/2008   GERD 07/29/2008   Osteoporosis 07/29/2008   PCP:  Sonny Masters, FNP Pharmacy:   [facility] - [place], [place] - [address] [place] [address] Phone: [phone] Fax: [phone]  [facility] [place], [place] - [address] [place] [address] Phone: [phone] Fax: [phone]  [facility] - [place], [place] - [address] [place] [address] Phone: [phone] Fax: [phone]     Social Determinants of Health (SDOH) Social History: SDOH Screenings   Food Insecurity: No Food Insecurity (04/27/2023)  Housing: Low Risk  (04/27/2023)  Transportation Needs: No Transportation Needs (04/27/2023)  Utilities: Not At Risk (04/27/2023)  Depression (PHQ2-9): Low Risk  (12/24/2021)  Tobacco Use: Low Risk  (04/20/2023)   SDOH Interventions:     Readmission Risk Interventions    03/26/2021   12:33 PM 03/25/2021    2:59 PM  Readmission Risk Prevention Plan  Medication Screening  Complete  Transportation Screening  Complete  PCP or Specialist Appt within 5-7 Days Complete   Home Care Screening Complete   Medication Review (RN CM)  Complete

## 2023-04-28 NOTE — Evaluation (Addendum)
Physical Therapy Evaluation Patient Details Name: Norma Barajas MRN: [id number] DOB: 1941-03-31 Today's Date: 04/28/2023  History of Present Illness  As per H&P written by Dr.Zierle-Ghosh   Norma Barajas is a 82 y.o. female with medical history significant of acoustic neuroma, BPPV, coronary artery disease, depression, diastolic dysfunction, essential hypertension, GERD, hyperlipidemia, paroxysmal atrial fibrillation, and more presents the ED with a chief complaint of high blood pressure and A-fib.  Patient reports that she went grocery shopping today in the normal active bringing her groceries and completely exhausted her.  She reports she thought she might pass out by the time she got all the groceries to the kitchen.  That feeling went away and later at 4 PM she had sudden onset of chest tightness and palpitations.  Patient reports she was sitting on the couch when that happened.  She did not have associated dizziness or headache.  She reports the chest tightness was substernal.  It did not radiate.  She is not sure what made it better, but it did get better in the ER.  It did not get better with EMS when they initially brought her heart rate down.  She reports no associated nausea or diaphoresis.  Patient reports that right now her neck hurts.  It is the back of her neck.  She thinks it is because of the way she is laying.  Laying swing makes it worse.  Sitting up would make it better.  Patient has no other complaints at this time.   Clinical Impression  Pt admitted with above diagnosis. Patient sitting in recliner upon therapist arrival and agreeable to participating in evaluation today. Patient completed transfers with min guard for safety. Patient ambulated 60 feet demonstrating a slow, somewhat labored cadence using RW. Significant kyphosis noted. Patient demonstrated a narrow base with toeing out posture. Patient's activity level limited by fatigue  Patient sitting in recliner at end of session -  nursing notified. Patient reports she is awaiting a room at an ALF and she needs assistance for bathing. Patient evaluated by Physical Therapy with no further acute PT needs identified. All education has been completed and the patient has no further questions. See below for any follow-up Physical Therapy or equipment needs. PT is signing off. Thank you for this referral.     Recommendations for follow up therapy are one component of a multi-disciplinary discharge planning process, led by the attending physician.  Recommendations may be updated based on patient status, additional functional criteria and insurance authorization.  Follow Up Recommendations       Assistance Recommended at Discharge Intermittent Supervision/Assistance  Patient can return home with the following  A little help with walking and/or transfers;A little help with bathing/dressing/bathroom;Assistance with cooking/housework;Assist for transportation;Help with stairs or ramp for entrance;Direct supervision/assist for financial management    Equipment Recommendations None recommended by PT  Recommendations for Other Services       Functional Status Assessment Patient has had a recent decline in their functional status and demonstrates the ability to make significant improvements in function in a reasonable and predictable amount of time.     Precautions / Restrictions Precautions Precautions: Fall Precaution Comments: patient reports no falls in the last six months Restrictions Weight Bearing Restrictions: No      Mobility  Bed Mobility               General bed mobility comments: patient sitting in recliner at beginning and end of session    Transfers Overall  transfer level: Needs assistance Equipment used: Rolling walker (2 wheels) Transfers: Sit to/from Stand, Bed to chair/wheelchair/BSC Sit to Stand: Min guard, Supervision   Step pivot transfers: Min guard   Anterior-Posterior transfers:  Supervision   General transfer comment: min guard for safety    Ambulation/Gait Ambulation/Gait assistance: Min guard Gait Distance (Feet): 60 Feet Assistive device: Rolling walker (2 wheels) Gait Pattern/deviations: Step-through pattern, Decreased step length - right, Decreased step length - left, Decreased stride length, Narrow base of support Gait velocity: decreased     General Gait Details: slow, somewhat labored cadence using RW, significant kyphosis noted; narrow base with toeing out posture; limited by fatigue; on room air throughout  Stairs            Wheelchair Mobility    Modified Rankin (Stroke Patients Only)       Balance Overall balance assessment: Mild deficits observed, not formally tested, History of Falls (patient reports muliple falls with fractures but none in the last six months.)             Pertinent Vitals/Pain Pain Assessment Pain Assessment: No/denies pain Pain Score:  (tired)    Home Living Family/patient expects to be discharged to:: Private residence Living Arrangements: Alone Available Help at Discharge: Family;Available PRN/intermittently Type of Home: House Home Access: Stairs to enter Entrance Stairs-Rails: Right Entrance Stairs-Number of Steps: 4   Home Layout: Multi-level;Able to live on main level with bedroom/bathroom (patient avoids sunken den) Home Equipment: Tub bench;Grab bars - tub/shower;Grab bars - toilet;Rollator (4 wheels);Rolling Walker (2 wheels);Cane - single point;BSC/3in1;Wheelchair - manual      Prior Function Prior Level of Function : Needs assist       Physical Assist : Mobility (physical);ADLs (physical) Mobility (physical): Stairs ADLs (physical): IADLs Mobility Comments: reports ambulating in house with Rollator and SPC or RW for community distances ADLs Comments: neice helps with IADLs     Hand Dominance   Dominant Hand: Right    Extremity/Trunk Assessment   Upper Extremity  Assessment Upper Extremity Assessment: Generalized weakness    Lower Extremity Assessment Lower Extremity Assessment: Generalized weakness    Cervical / Trunk Assessment Cervical / Trunk Assessment: Kyphotic  Communication   Communication: No difficulties  Cognition Arousal/Alertness: Awake/alert Behavior During Therapy: WFL for tasks assessed/performed Overall Cognitive Status: Within Functional Limits for tasks assessed          General Comments      Exercises     Assessment/Plan    PT Assessment All further PT needs can be met in the next venue of care  PT Problem List Decreased strength;Decreased balance;Decreased activity tolerance       PT Treatment Interventions Balance training;Patient/family education;Gait training;Therapeutic activities;Therapeutic exercise;Stair training    PT Goals (Current goals can be found in the Care Plan section)       Frequency          AM-PAC PT "6 Clicks" Mobility  Outcome Measure Help needed turning from your back to your side while in a flat bed without using bedrails?: None Help needed moving from lying on your back to sitting on the side of a flat bed without using bedrails?: None Help needed moving to and from a bed to a chair (including a wheelchair)?: A Little Help needed standing up from a chair using your arms (e.g., wheelchair or bedside chair)?: A Little Help needed to walk in hospital room?: A Little Help needed climbing 3-5 steps with a railing? : A Little 6 Click Score:  20    End of Session   Activity Tolerance: Patient tolerated treatment well;Patient limited by fatigue Patient left: with call bell/phone within reach;in chair (patient seated at edge of bed to eat her lunch - nursing aware.) Nurse Communication: Mobility status PT Visit Diagnosis: Unsteadiness on feet (R26.81);Other abnormalities of gait and mobility (R26.89);History of falling (Z91.81);Muscle weakness (generalized) (M62.81)    Time:  1610-9604 PT Time Calculation (min) (ACUTE ONLY): 27 min   Charges:   PT Evaluation $PT Eval Low Complexity: 1 Low PT Treatments $Therapeutic Activity: 8-22 mins        Katina Dung. Hartnett-Rands, MS, PT [facility] PT [facility] [place] [phone]  [name]  Hartnett-Rands 04/28/2023, 11:08 AM

## 2023-04-28 NOTE — Progress Notes (Signed)
Initial Nutrition Assessment  DOCUMENTATION CODES:   Not applicable  INTERVENTION:   -Continue Ensure Enlive po BID, each supplement provides 350 kcal and 20 grams of protein -MVI with minerals daily -Liberalize diet to 2 gram sodium for wider variety of meal selections  NUTRITION DIAGNOSIS:   Inadequate oral intake related to poor appetite as evidenced by per patient/family report.  GOAL:   Patient will meet greater than or equal to 90% of their needs  MONITOR:   PO intake, Supplement acceptance  REASON FOR ASSESSMENT:   Malnutrition Screening Tool    ASSESSMENT:   Pt with medical history significant of acoustic neuroma with hearing impairment, PAF not on systemic anticoagulation due to history of frequent falls, HTN, chronic HFpEF, GERD, HLD, presented with palpitations and rapid A-fib.  Pt admitted with a-fib with RVR.   Reviewed I/O's: +240 ml x 24 hours  Spoke with pt over the phone. Pt reports "they say I'm feeling better, but I'm no really sure". Pt explains to this RD that she is a "worry wart" and has been having trouble sleeping at night, but nothing seems to help. Upon further questioning, pt revealed that she recently lost her husband and son and has been struggling with the transition of now living alone. She verbalized uncertainty of transitioning to SNF, where she thinks that she will eventually end up. RD provided active listening and emotional support. Offered chaplain consult for extra layer support. Pt politely declined stating "my faith in the Lord will see me through".   Pt reports that she does not have an appetite and this has been occurring for a year or more. PTA 2 consumes 2 meals per day (Breakfast: oat meal and a half of a banana; Dinner: banana sandwich). Pt drinks mostly water and occasionally snacks on Doritos through the day. She also consumes one Boost supplement daily. Pt consumed her breakfast this morning (oatmeal and banana).   Pt endorses  progressive wt loss over the past 2 years. Her UBW is around 130# and reports she now weighs around 106#. Reviewed wt hx; pt has experienced a 4% wt loss over the past 3 months, which is not significant for time frame.   Discussed importance of good meal and supplement intake to promote healing. Pt amenable to Ensure supplements. Discussed importance of continuing at home- she thinks she will be able to drink two per day.   Medications reviewed and include protonix and senokot.   Labs reviewed.   Diet Order:   Diet Order             Diet Heart Room service appropriate? Yes; Fluid consistency: Thin; Fluid restriction: 1800 mL Fluid  Diet effective now                   EDUCATION NEEDS:   Education needs have been addressed  Skin:  Skin Assessment: Reviewed RN Assessment  Last BM:  04/27/23  Height:   Ht Readings from Last 1 Encounters:  04/27/23 4\' 8"  (1.422 m)    Weight:   Wt Readings from Last 1 Encounters:  04/28/23 47.3 kg    Ideal Body Weight:  42.4 kg  BMI:  Body mass index is 23.38 kg/m.  Estimated Nutritional Needs:   Kcal:  1400-1600  Protein:  55-70 grams  Fluid:  1.4-1.6 L    Levada Schilling, RD, LDN, CDCES Registered Dietitian II Certified Diabetes Care and Education Specialist Please refer to [facility] for RD and/or RD on-call/weekend/after hours pager

## 2023-04-28 NOTE — Discharge Instructions (Signed)
IMPORTANT INFORMATION: PAY CLOSE ATTENTION   PHYSICIAN DISCHARGE INSTRUCTIONS  Follow with Primary care provider  Rakes, Linda M, FNP  and other consultants as instructed by your Hospitalist Physician  SEEK MEDICAL CARE OR RETURN TO EMERGENCY ROOM IF SYMPTOMS COME BACK, WORSEN OR NEW PROBLEM DEVELOPS   Please note: You were cared for by a hospitalist during your hospital stay. Every effort will be made to forward records to your primary care provider.  You can request that your primary care provider send for your hospital records if they have not received them.  Once you are discharged, your primary care physician will handle any further medical issues. Please note that NO REFILLS for any discharge medications will be authorized once you are discharged, as it is imperative that you return to your primary care physician (or establish a relationship with a primary care physician if you do not have one) for your post hospital discharge needs so that they can reassess your need for medications and monitor your lab values.  Please get a complete blood count and chemistry panel checked by your Primary MD at your next visit, and again as instructed by your Primary MD.  Get Medicines reviewed and adjusted: Please take all your medications with you for your next visit with your Primary MD  Laboratory/radiological data: Please request your Primary MD to go over all hospital tests and procedure/radiological results at the follow up, please ask your primary care provider to get all Hospital records sent to his/her office.  In some cases, they will be blood work, cultures and biopsy results pending at the time of your discharge. Please request that your primary care provider follow up on these results.  If you are diabetic, please bring your blood sugar readings with you to your follow up appointment with primary care.    Please call and make your follow up appointments as soon as possible.    Also Note  the following: If you experience worsening of your admission symptoms, develop shortness of breath, life threatening emergency, suicidal or homicidal thoughts you must seek medical attention immediately by calling 911 or calling your MD immediately  if symptoms less severe.  You must read complete instructions/literature along with all the possible adverse reactions/side effects for all the Medicines you take and that have been prescribed to you. Take any new Medicines after you have completely understood and accpet all the possible adverse reactions/side effects.   Do not drive when taking Pain medications or sleeping medications (Benzodiazepines)  Do not take more than prescribed Pain, Sleep and Anxiety Medications. It is not advisable to combine anxiety,sleep and pain medications without talking with your primary care practitioner  Special Instructions: If you have smoked or chewed Tobacco  in the last 2 yrs please stop smoking, stop any regular Alcohol  and or any Recreational drug use.  Wear Seat belts while driving.  Do not drive if taking any narcotic, mind altering or controlled substances or recreational drugs or alcohol.       

## 2023-04-28 NOTE — Discharge Summary (Signed)
Physician Discharge Summary  Norma Barajas ZOX:[id number] DOB: 02-Aug-1941 DOA: 04/27/2023  PCP: Sonny Masters, FNP  Admit date: 04/27/2023 Discharge date: 04/28/2023  Admitted From:  Home  Disposition: Home with [facility]: [facility] [name]   Recommendations for Outpatient Follow-up:  Follow up with PCP in 1-2 weeks Follow up with cardiology on 05/13/23 as scheduled  Please consider outpatient palliative medicine consultation  Home Health:  PT, RN, Aide   Discharge Condition: STABLE   CODE STATUS: DNR DIET: 2 gram sodium restricted    Brief Hospitalization Summary: Please see all hospital notes, images, labs for full details of the hospitalization. Admission Provider HPI:  82 y.o. female with medical history significant of acoustic neuroma with hearing impairment, PAF not on systemic anticoagulation due to history of frequent falls, HTN, chronic HFpEF, GERD, HLD, presented with palpitations and rapid A-fib.   Patient was recently hospitalized last week for similar presentation with breakthrough rapid A-fib, was treated with IV Cardizem drip and she was started on metoprolol 75 mg twice daily on discharge.  Over the last week, patient has been compliant with her new medication metoprolol but she feels occasional palpitations.  This morning after a hot shower, patient became very exorcist and started to feel strong palpitations and lightheadedness.  She called EMS, EMS arrived and found her heart rate 164 and hypotensive.  En route to [facility], ED, she was given 400 cc of IV fluid and heart rate improved to lower 100.  She denies any chest pain leg swelling cough, no fever or chills no urinary complaints.   ED Course: Telemetry monitoring showed in and out of A-fib with heart rate ranging from 80-1 20s.  Blood pressure elevated SBP 150-160s.  Chest x-ray showed no acute infiltrates.  Blood work showed K3.8 creatinine 1.0 hemoglobin 14.3.   EKG showed A-fib with RVR.  Hospital Course   Patient was admitted with  another recurrence of A-fib RVR.  She had been discharged on 04/19/2023 with a similar presentation.  She was discharged on a higher dose of metoprolol 75 mg twice daily.  Patient says she believes she was taking the correct dose however I am not convinced.  We have increased her metoprolol dose to 100 mg twice daily and she has maintain good heart rate control on this current higher dose.  She has been evaluated by physical therapy and she is feeling much better with no symptoms of palpitations.  She will be going to live at [facility] and they will be assisting her with her medications.  She is just waiting on a bed to open up and [facility].  Pt is discharging in stable condition.  She has an appt with cardiology [facility] already scheduled for 05/13/23.    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)   Atrial fibrillation with RVR [facility])   Discharge Instructions:  Allergies as of 04/28/2023       Reactions   Codeine Nausea And Vomiting   Doxycycline Other (See Comments)   Chest congestion   Triamterene-hctz Other (See Comments)   weakness   Atorvastatin Other (See Comments)   Myalgias   Crestor [rosuvastatin Calcium] Other (See Comments)   weakness   Morphine Nausea Only   Risedronate Sodium Other (See Comments)   ACTONEL - reflux        Medication List     TAKE these medications  acetaminophen 650 MG CR tablet Commonly known as: TYLENOL Take 1,300 mg by mouth every 6 (six) hours. Displaced intertrochanteric fracture of right femur   amLODipine 5 MG tablet Commonly known as: NORVASC Take 0.5 tablets (2.5 mg total) by mouth daily.   Aspirin Low Dose 81 MG tablet Generic drug: aspirin EC TAKE 1 TABLET TWICE DAILY What changed:  how much to take when to take this   bisacodyl 10 MG suppository Commonly known as: Dulcolax Place 1  suppository (10 mg total) rectally as needed for moderate constipation.   cholecalciferol 25 MCG (1000 UNIT) tablet Commonly known as: VITAMIN D3 Take 1,000 Units by mouth in the morning.   metoprolol tartrate 100 MG tablet Commonly known as: LOPRESSOR Take 1 tablet (100 mg total) by mouth 2 (two) times daily. What changed:  medication strength how much to take   omeprazole 20 MG capsule Commonly known as: PRILOSEC TAKE 1 CAPSULE EVERY DAY   sennosides-docusate sodium 8.6-50 MG tablet Commonly known as: SENOKOT-S Take 1 tablet by mouth every morning.   simvastatin 20 MG tablet Commonly known as: ZOCOR Take 1 tablet (20 mg total) by mouth daily at 6 PM.   torsemide 20 MG tablet Commonly known as: DEMADEX Take 1 tablet (20 mg total) by mouth 2 (two) times daily. Start taking on: April 29, 2023        Follow-up Information     [facility], [facility] Follow up.   Specialty: Home Health Services Why: [facility] staff will call you to schedule in home visits Contact information: [address] [place] [address] [phone]         Sonny Masters, FNP. Schedule an appointment as soon as possible for a visit in 1 week(s).   Specialty: Family Medicine Why: Hospital Follow Up Contact information: [address] [place] [address] [phone]         Sharlene Dory, NP. Go on 05/13/2023.   Specialty: Cardiology Why: as scheduled at 3:30 pm Contact information: [address] [place] [address] [phone]                Allergies  Allergen Reactions   Codeine Nausea And Vomiting   Doxycycline Other (See Comments)    Chest congestion   Triamterene-Hctz Other (See Comments)    weakness   Atorvastatin Other (See Comments)    Myalgias    Crestor [Rosuvastatin Calcium] Other (See Comments)    weakness   Morphine Nausea Only   Risedronate Sodium Other (See Comments)    ACTONEL - reflux   Allergies as of  04/28/2023       Reactions   Codeine Nausea And Vomiting   Doxycycline Other (See Comments)   Chest congestion   Triamterene-hctz Other (See Comments)   weakness   Atorvastatin Other (See Comments)   Myalgias   Crestor [rosuvastatin Calcium] Other (See Comments)   weakness   Morphine Nausea Only   Risedronate Sodium Other (See Comments)   ACTONEL - reflux        Medication List     TAKE these medications    acetaminophen 650 MG CR tablet Commonly known as: TYLENOL Take 1,300 mg by mouth every 6 (six) hours. Displaced intertrochanteric fracture of right femur   amLODipine 5 MG tablet Commonly known as: NORVASC Take 0.5 tablets (2.5 mg total) by mouth daily.   Aspirin Low Dose 81 MG tablet Generic drug: aspirin EC TAKE 1 TABLET TWICE  DAILY What changed:  how much to take when to take this   bisacodyl 10 MG suppository Commonly known as: Dulcolax Place 1 suppository (10 mg total) rectally as needed for moderate constipation.   cholecalciferol 25 MCG (1000 UNIT) tablet Commonly known as: VITAMIN D3 Take 1,000 Units by mouth in the morning.   metoprolol tartrate 100 MG tablet Commonly known as: LOPRESSOR Take 1 tablet (100 mg total) by mouth 2 (two) times daily. What changed:  medication strength how much to take   omeprazole 20 MG capsule Commonly known as: PRILOSEC TAKE 1 CAPSULE EVERY DAY   sennosides-docusate sodium 8.6-50 MG tablet Commonly known as: SENOKOT-S Take 1 tablet by mouth every morning.   simvastatin 20 MG tablet Commonly known as: ZOCOR Take 1 tablet (20 mg total) by mouth daily at 6 PM.   torsemide 20 MG tablet Commonly known as: DEMADEX Take 1 tablet (20 mg total) by mouth 2 (two) times daily. Start taking on: April 29, 2023        Procedures/Studies: DG Chest 2 View  Result Date: 04/27/2023 CLINICAL DATA:  Chest pain EXAM: CHEST - 2 VIEW COMPARISON:  04/16/2023 FINDINGS: Enlarged cardiopericardial silhouette. No consolidation,  pneumothorax or effusion. No edema. Calcified aorta. Overlapping cardiac leads. Osteopenia with degenerative changes. Calcifications along the tracheobronchial tree. IMPRESSION: Enlarged heart.  No consolidation. Electronically Signed   By: Karen Kays M.D.   On: 04/27/2023 15:33   ECHOCARDIOGRAM COMPLETE  Result Date: 04/17/2023    ECHOCARDIOGRAM REPORT   Patient Name:   KINLIE GEURTS Date of Exam: 04/17/2023 Medical Rec #:  [id number]   Height:       56.0 in Accession #:    [id number]  Weight:       106.5 lb Date of Birth:  10-21-1941   BSA:          1.358 m Patient Age:    82 years    BP:           137/79 mmHg Patient Gender: F           HR:           76 bpm. [facility] Procedure: 2D Echo, Cardiac Doppler and Color Doppler Indications:    Atrial Fibrillation I48.91  History:        Patient has prior history of Echocardiogram examinations, most                 recent 09/09/2020. CHF, Arrythmias:Atrial Fibrillation; Risk                 Factors:Hypertension and Dyslipidemia.  Sonographer:    Celesta Gentile RCS Referring Phys: [id number] ASIA B ZIERLE-GHOSH IMPRESSIONS  1. Left ventricular ejection fraction, by estimation, is approximately 55%. The left ventricle has normal function. The left ventricle has no regional wall motion abnormalities. There is mild asymmetric left ventricular hypertrophy of the basal segment.  Left ventricular diastolic parameters are indeterminate. There is the interventricular septum is flattened in diastole ('D' shaped left ventricle), consistent with right ventricular volume overload.  2. Right ventricular systolic function is mildly reduced. The right ventricular size is moderately enlarged. There is normal pulmonary artery systolic pressure. The estimated right ventricular systolic pressure is 30.1 mmHg.  3. Left atrial size was severely dilated.  4. Right atrial size was severely dilated.  5. The mitral valve is grossly normal. Mild mitral valve regurgitation.  6.  Tricuspid valve mildly thickened with incomplete coaptation likely related to  annular dilatation. The tricuspid valve is abnormal. Tricuspid valve regurgitation is severe.  7. The aortic valve is tricuspid. Aortic valve regurgitation is mild. Aortic valve sclerosis/calcification is present, without any evidence of aortic stenosis.  8. The inferior vena cava is dilated in size with >50% respiratory variability, suggesting right atrial pressure of 8 mmHg. Comparison(s): No significant change from prior study. Prior images reviewed side by side. FINDINGS  Left Ventricle: Left ventricular ejection fraction, by estimation, is 55%. The left ventricle has normal function. The left ventricle has no regional wall motion abnormalities. The left ventricular internal cavity size was normal in size. There is mild asymmetric left ventricular hypertrophy of the basal segment. The interventricular septum is flattened in diastole ('D' shaped left ventricle), consistent with right ventricular volume overload. Left ventricular diastolic function could not be evaluated due to atrial fibrillation. Left ventricular diastolic parameters are indeterminate. Right Ventricle: The right ventricular size is moderately enlarged. No increase in right ventricular wall thickness. Right ventricular systolic function is mildly reduced. There is normal pulmonary artery systolic pressure. The tricuspid regurgitant velocity is 2.35 m/s, and with an assumed right atrial pressure of 8 mmHg, the estimated right ventricular systolic pressure is 30.1 mmHg. Left Atrium: Left atrial size was severely dilated. Right Atrium: Right atrial size was severely dilated. Pericardium: There is no evidence of pericardial effusion. Mitral Valve: The mitral valve is grossly normal. Mild mitral annular calcification. Mild mitral valve regurgitation. Tricuspid Valve: Tricuspid valve mildly thickened with incomplete coaptation likely related to annular dilatation. The  tricuspid valve is abnormal. Tricuspid valve regurgitation is severe. Aortic Valve: The aortic valve is tricuspid. There is mild aortic valve annular calcification. Aortic valve regurgitation is mild. Aortic regurgitation PHT measures 485 msec. Aortic valve sclerosis/calcification is present, without any evidence of aortic  stenosis. Pulmonic Valve: The pulmonic valve was grossly normal. Pulmonic valve regurgitation is trivial. Aorta: The aortic root is normal in size and structure. Venous: The inferior vena cava is dilated in size with greater than 50% respiratory variability, suggesting right atrial pressure of 8 mmHg. IAS/Shunts: No atrial level shunt detected by color flow Doppler.  LEFT VENTRICLE PLAX 2D LVIDd:         3.70 cm LVIDs:         2.60 cm LV PW:         0.90 cm LV IVS:        1.20 cm LVOT diam:     1.70 cm LV SV:         37 LV SV Index:   27 LVOT Area:     2.27 cm  RIGHT VENTRICLE RV S prime:     9.14 cm/s TAPSE (M-mode): 1.6 cm LEFT ATRIUM             Index        RIGHT ATRIUM           Index LA diam:        3.70 cm 2.72 cm/m   RA Area:     25.40 cm LA Vol (A2C):   64.7 ml 47.64 ml/m  RA Volume:   91.40 ml  67.31 ml/m LA Vol (A4C):   75.1 ml 55.30 ml/m LA Biplane Vol: 74.1 ml 54.57 ml/m  AORTIC VALVE LVOT Vmax:   87.70 cm/s LVOT Vmean:  57.250 cm/s LVOT VTI:    0.164 m AI PHT:      485 msec  AORTA Ao Root diam: 3.20 cm MITRAL VALVE  TRICUSPID VALVE MV Area (PHT): 4.80 cm       TR Peak grad:   22.1 mmHg MV Decel Time: 158 msec       TR Vmax:        235.00 cm/s MR Peak grad:    92.9 mmHg MR Mean grad:    56.0 mmHg    SHUNTS MR Vmax:         482.00 cm/s  Systemic VTI:  0.16 m MR Vmean:        343.0 cm/s   Systemic Diam: 1.70 cm MR PISA:         1.01 cm MR PISA Eff ROA: 5 mm MR PISA Radius:  0.40 cm MV E velocity: 89.10 cm/s Nona Dell MD Electronically signed by Nona Dell MD Signature Date/Time: 04/17/2023/2:02:27 PM    Final    DG Chest Port 1 View  Result Date:  04/16/2023 CLINICAL DATA:  Shortness of breath EXAM: PORTABLE CHEST 1 VIEW COMPARISON:  08/12/2021 FINDINGS: Stable enlargement of the cardiomediastinal silhouette. Aortic atherosclerotic calcification. Low lung volumes accentuate pulmonary vascularity. Bibasilar atelectasis. No pleural effusion or pneumothorax. No displaced rib fractures. IMPRESSION: Low lung volumes with basilar atelectasis. Electronically Signed   By: Minerva Fester M.D.   On: 04/16/2023 19:35     Subjective: Pt no longer having palpitations.  She is feeling back to her baseline.  No CP, no SOB symptoms. Pt says she is going to [facility] soon when bed is ready.   Discharge Exam: Vitals:   04/28/23 0040 04/28/23 0415  BP: (!) 157/85 (!) 146/97  Pulse: 80 70  Resp: 18 18  Temp: 97.7 F (36.5 C) 97.9 F (36.6 C)  SpO2: 97% 96%   Vitals:   04/27/23 2007 04/28/23 0040 04/28/23 0415 04/28/23 0500  BP: (!) 172/120 (!) 157/85 (!) 146/97   Pulse: (!) 101 80 70   Resp: 20 18 18    Temp: 97.8 F (36.6 C) 97.7 F (36.5 C) 97.9 F (36.6 C)   TempSrc: Oral Oral    SpO2: 96% 97% 96%   Weight: 47.7 kg   47.3 kg  Height: 4\' 8"  (1.422 m)       General: Pt is alert, awake, not in acute distress Cardiovascular: normal S1/S2 +, no rubs, no gallops Respiratory: CTA bilaterally, no wheezing, no rhonchi Abdominal: Soft, NT, ND, bowel sounds + Extremities: no edema, no cyanosis   The results of significant diagnostics from this hospitalization (including imaging, microbiology, ancillary and laboratory) are listed below for reference.     Microbiology: No results found for this or any previous visit (from the past 240 hour(s)).   Labs: BNP (last 3 results) Recent Labs    04/20/23 0950  BNP 275.8*   Basic Metabolic Panel: Recent Labs  Lab 04/27/23 1508 04/27/23 1645 04/28/23 0406  NA 135  --  140  K 3.8  --  3.9  CL 97*  --  101  CO2 27  --  27  GLUCOSE 188*  --  81  BUN 32*  --  19  CREATININE 1.06*  --   0.71  CALCIUM 8.5*  --  8.9  MG  --  2.1  --   PHOS  --  3.3  --    Liver Function Tests: Recent Labs  Lab 04/27/23 1508  AST 29  ALT 19  ALKPHOS 92  BILITOT 0.7  PROT 6.8  ALBUMIN 3.7   No results for input(s): "LIPASE", "AMYLASE" in the last  168 hours. No results for input(s): "AMMONIA" in the last 168 hours. CBC: Recent Labs  Lab 04/27/23 1508  WBC 7.0  HGB 14.3  HCT 43.6  MCV 99.1  PLT 149*   Cardiac Enzymes: No results for input(s): "CKTOTAL", "CKMB", "CKMBINDEX", "TROPONINI" in the last 168 hours. BNP: Invalid input(s): "POCBNP" CBG: No results for input(s): "GLUCAP" in the last 168 hours. D-Dimer No results for input(s): "DDIMER" in the last 72 hours. Hgb A1c No results for input(s): "HGBA1C" in the last 72 hours. Lipid Profile No results for input(s): "CHOL", "HDL", "LDLCALC", "TRIG", "CHOLHDL", "LDLDIRECT" in the last 72 hours. Thyroid function studies No results for input(s): "TSH", "T4TOTAL", "T3FREE", "THYROIDAB" in the last 72 hours.  Invalid input(s): "FREET3" Anemia work up No results for input(s): "VITAMINB12", "FOLATE", "FERRITIN", "TIBC", "IRON", "RETICCTPCT" in the last 72 hours. Urinalysis    Component Value Date/Time   COLORURINE STRAW (A) 04/27/2023 1519   APPEARANCEUR CLEAR 04/27/2023 1519   APPEARANCEUR Clear 07/31/2020 1545   LABSPEC 1.006 04/27/2023 1519   PHURINE 5.0 04/27/2023 1519   GLUCOSEU NEGATIVE 04/27/2023 1519   HGBUR NEGATIVE 04/27/2023 1519   BILIRUBINUR NEGATIVE 04/27/2023 1519   BILIRUBINUR Negative 07/31/2020 1545   KETONESUR NEGATIVE 04/27/2023 1519   PROTEINUR NEGATIVE 04/27/2023 1519   UROBILINOGEN negative 02/06/2015 1252   NITRITE NEGATIVE 04/27/2023 1519   LEUKOCYTESUR NEGATIVE 04/27/2023 1519   Sepsis Labs Recent Labs  Lab 04/27/23 1508  WBC 7.0   Microbiology No results found for this or any previous visit (from the past 240 hour(s)).  Time coordinating discharge: 44 mins   SIGNED:  Standley Dakins, MD  [facility] 04/28/2023, 11:11 AM How to contact the [facility] Attending or Consulting provider 7A - 7P or covering provider during after hours 7P -7A, for this patient?  Check the care team in [facility] and look for a) attending/consulting [facility] provider listed and b) the [facility] team listed Log into [email or web address] and use [facility]'s universal password to access. If you do not have the password, please contact the hospital operator. Locate the [facility] provider you are looking for under [facility] and page to a number that you can be directly reached. If you still have difficulty reaching the provider, please page the [facility] (Director on Call) for the Hospitalists listed on amion for assistance.

## 2023-04-28 NOTE — Care Management Obs Status (Signed)
MEDICARE OBSERVATION STATUS NOTIFICATION   Patient Details  Name: IZABELLA HANSEN MRN: [id number] Date of Birth: 09-29-1941   Medicare Observation Status Notification Given:  Yes    Corey Harold 04/28/2023, 3:19 PM

## 2023-04-29 NOTE — ED Provider Notes (Signed)
[facility] MEDICAL SURGICAL UNIT Provider Note   CSN: [id number] Arrival date & time: 04/27/23  1410     History  Chief Complaint  Patient presents with   Chest Pain    Norma Barajas is a 82 y.o. female.  Patient has a history of atrial fibrillation.  She is feeling dizzy and having chest discomfort and her heart rate was 160.  When she arrived in the emergency department she had a normal heart rate and atrial fibs  The history is provided by the patient and medical records. No language interpreter was used.  Chest Pain Pain location:  L chest Pain quality: aching   Pain radiates to:  Does not radiate Pain severity:  Mild Onset quality:  Sudden Timing:  Intermittent Progression:  Resolved Chronicity:  Recurrent Context: not breathing   Relieved by:  Nothing Worsened by:  Nothing Ineffective treatments:  None tried Associated symptoms: palpitations   Associated symptoms: no abdominal pain, no back pain, no cough, no fatigue and no headache        Home Medications Prior to Admission medications   Medication Sig Start Date End Date Taking? Authorizing Provider  acetaminophen (TYLENOL) 650 MG CR tablet Take 1,300 mg by mouth every 6 (six) hours. Displaced intertrochanteric fracture of right femur    [provider]  amLODipine (NORVASC) 5 MG tablet Take 0.5 tablets (2.5 mg total) by mouth daily. 04/19/23   Vassie Loll, MD  ASPIRIN LOW DOSE 81 MG tablet TAKE 1 TABLET TWICE DAILY Patient taking differently: Take 81 mg by mouth every morning. 06/23/22   Gwenlyn Fudge, FNP  bisacodyl (DULCOLAX) 10 MG suppository Place 1 suppository (10 mg total) rectally as needed for moderate constipation. 01/06/22   Deliah Boston F, FNP  cholecalciferol (VITAMIN D3) 25 MCG (1000 UNIT) tablet Take 1,000 Units by mouth in the morning.    [provider]  metoprolol tartrate (LOPRESSOR) 100 MG tablet Take 1 tablet (100 mg total) by mouth 2 (two) times daily. 04/28/23    Cleora Fleet, MD  omeprazole (PRILOSEC) 20 MG capsule TAKE 1 CAPSULE EVERY DAY 02/28/23   Sonny Masters, FNP  sennosides-docusate sodium (SENOKOT-S) 8.6-50 MG tablet Take 1 tablet by mouth every morning. 04/19/23   Vassie Loll, MD  simvastatin (ZOCOR) 20 MG tablet Take 1 tablet (20 mg total) by mouth daily at 6 PM. 04/28/23   Johnson, Clanford L, MD  torsemide (DEMADEX) 20 MG tablet Take 1 tablet (20 mg total) by mouth 2 (two) times daily. 04/29/23   Cleora Fleet, MD      Allergies    Codeine, Doxycycline, Triamterene-hctz, Atorvastatin, Crestor [rosuvastatin calcium], Morphine, and Risedronate sodium    Review of Systems   Review of Systems  Constitutional:  Negative for appetite change and fatigue.  HENT:  Negative for congestion, ear discharge and sinus pressure.   Eyes:  Negative for discharge.  Respiratory:  Negative for cough.   Cardiovascular:  Positive for chest pain and palpitations.  Gastrointestinal:  Negative for abdominal pain and diarrhea.  Genitourinary:  Negative for frequency and hematuria.  Musculoskeletal:  Negative for back pain.  Skin:  Negative for rash.  Neurological:  Negative for seizures and headaches.  Psychiatric/Behavioral:  Negative for hallucinations.     Physical Exam Updated Vital Signs BP 125/74 (BP Location: Left Arm)   Pulse 79   Temp 97.7 F (36.5 C) (Oral)   Resp 18   Ht 4\' 8"  (1.422 m)   Wt  47.3 kg   SpO2 98%   BMI 23.38 kg/m  Physical Exam Vitals and nursing note reviewed.  Constitutional:      Appearance: She is well-developed.  HENT:     Head: Normocephalic.     Nose: Nose normal.  Eyes:     General: No scleral icterus.    Conjunctiva/sclera: Conjunctivae normal.  Neck:     Thyroid: No thyromegaly.  Cardiovascular:     Rate and Rhythm: Normal rate. Rhythm irregular.     Heart sounds: No murmur heard.    No friction rub. No gallop.  Pulmonary:     Breath sounds: No stridor. No wheezing or rales.  Chest:      Chest wall: No tenderness.  Abdominal:     General: There is no distension.     Tenderness: There is no abdominal tenderness. There is no rebound.  Musculoskeletal:        General: Normal range of motion.     Cervical back: Neck supple.  Lymphadenopathy:     Cervical: No cervical adenopathy.  Skin:    Findings: No erythema or rash.  Neurological:     Mental Status: She is alert and oriented to person, place, and time.     Motor: No abnormal muscle tone.     Coordination: Coordination normal.  Psychiatric:        Behavior: Behavior normal.     ED Results / Procedures / Treatments   Labs (all labs ordered are listed, but only abnormal results are displayed) Labs Reviewed  BASIC METABOLIC PANEL - Abnormal; Notable for the following components:      Result Value   Chloride 97 (*)    Glucose, Bld 188 (*)    BUN 32 (*)    Creatinine, Ser 1.06 (*)    Calcium 8.5 (*)    GFR, Estimated 52 (*)    All other components within normal limits  CBC - Abnormal; Notable for the following components:   Platelets 149 (*)    All other components within normal limits  URINALYSIS, ROUTINE W REFLEX MICROSCOPIC - Abnormal; Notable for the following components:   Color, Urine STRAW (*)    All other components within normal limits  HEPATIC FUNCTION PANEL  BASIC METABOLIC PANEL  MAGNESIUM  PHOSPHORUS  TROPONIN I (HIGH SENSITIVITY)  TROPONIN I (HIGH SENSITIVITY)    EKG EKG Interpretation  Date/Time:  Wednesday April 27 2023 14:23:46 EDT Ventricular Rate:  82 PR Interval:    QRS Duration: 84 QT Interval:  365 QTC Calculation: 427 R Axis:   11 Text Interpretation: Atrial fibrillation RSR' in V1 or V2, right VCD or RVH Borderline T abnormalities, diffuse leads Confirmed by Bethann Berkshire [phone]) on 04/27/2023 3:13:48 PM  Radiology DG Chest 2 View  Result Date: 04/27/2023 CLINICAL DATA:  Chest pain EXAM: CHEST - 2 VIEW COMPARISON:  04/16/2023 FINDINGS: Enlarged cardiopericardial  silhouette. No consolidation, pneumothorax or effusion. No edema. Calcified aorta. Overlapping cardiac leads. Osteopenia with degenerative changes. Calcifications along the tracheobronchial tree. IMPRESSION: Enlarged heart.  No consolidation. Electronically Signed   By: Karen Kays M.D.   On: 04/27/2023 15:33    Procedures Procedures    Medications Ordered in ED Medications  potassium chloride SA (KLOR-CON M) CR tablet 20 mEq (20 mEq Oral Given 04/27/23 1937)    ED Course/ Medical Decision Making/ A&P  Medical Decision Making Amount and/or Complexity of Data Reviewed Labs: ordered. Radiology: ordered.  Risk Decision regarding hospitalization.   Patient with chest pain and rapid atrial fibs.  Symptoms have resolved.  She is still in atrial for but not rapid.  She will be admitted to medicine for observation and possible cardiology consult        Final Clinical Impression(s) / ED Diagnoses Final diagnoses:  Atrial fibrillation with RVR (HCC)    Rx / DC Orders ED Discharge Orders          Ordered    torsemide (DEMADEX) 20 MG tablet  2 times daily        04/28/23 1109    metoprolol tartrate (LOPRESSOR) 100 MG tablet  2 times daily       Note to Pharmacy: Please d/c any other prescription on file for Metoprolol Tartrate.  Thank you   04/28/23 1109    simvastatin (ZOCOR) 20 MG tablet  Daily-1800        04/28/23 1109              Bethann Berkshire, MD 04/29/23 1230

## 2023-05-04 ENCOUNTER — Other Ambulatory Visit: Payer: Self-pay | Admitting: *Deleted

## 2023-05-04 NOTE — Progress Notes (Signed)
Acetaminophen & Docusate Sodium change per request from [facility]

## 2023-05-05 ENCOUNTER — Encounter: Payer: Self-pay | Admitting: Family Medicine

## 2023-05-05 ENCOUNTER — Ambulatory Visit (INDEPENDENT_AMBULATORY_CARE_PROVIDER_SITE_OTHER): Payer: Medicare HMO | Admitting: Family Medicine

## 2023-05-05 VITALS — BP 134/84 | HR 93 | Temp 97.7°F | Ht <= 58 in | Wt 105.8 lb

## 2023-05-05 DIAGNOSIS — H906 Mixed conductive and sensorineural hearing loss, bilateral: Secondary | ICD-10-CM

## 2023-05-05 DIAGNOSIS — I4891 Unspecified atrial fibrillation: Secondary | ICD-10-CM | POA: Diagnosis not present

## 2023-05-05 DIAGNOSIS — F5101 Primary insomnia: Secondary | ICD-10-CM | POA: Diagnosis not present

## 2023-05-05 DIAGNOSIS — I5082 Biventricular heart failure: Secondary | ICD-10-CM | POA: Diagnosis not present

## 2023-05-05 DIAGNOSIS — I5032 Chronic diastolic (congestive) heart failure: Secondary | ICD-10-CM | POA: Diagnosis not present

## 2023-05-05 LAB — CBC WITH DIFFERENTIAL/PLATELET
Eos: 2 %
Hemoglobin: 14.6 g/dL (ref 11.1–15.9)
Lymphocytes Absolute: 1.2 10*3/uL (ref 0.7–3.1)
RBC: 4.44 x10E6/uL (ref 3.77–5.28)
RDW: 12.4 % (ref 11.7–15.4)

## 2023-05-05 LAB — BMP8+EGFR
BUN: 31 mg/dL — ABNORMAL HIGH (ref 8–27)
Glucose: 83 mg/dL (ref 70–99)

## 2023-05-05 LAB — THYROID PANEL WITH TSH

## 2023-05-05 MED ORDER — TRAZODONE HCL 50 MG PO TABS
25.0000 mg | ORAL_TABLET | Freq: Every evening | ORAL | 3 refills | Status: DC | PRN
Start: 2023-05-05 — End: 2023-05-19

## 2023-05-05 NOTE — Progress Notes (Signed)
Subjective:  Patient ID: Norma Barajas, female    DOB: [birth year], 82 y.o.   MRN: [id number]  Patient Care Team: Sonny Masters, FNP as PCP - General (Family Medicine) Rollene Rotunda, MD as PCP - Cardiology (Cardiology) West Bali, MD (Inactive) as Attending Physician (Gastroenterology) Derryl Harbor, OD as Consulting Physician (Optometry) Newman Nip, NP (Inactive) as Consulting Physician (Cardiology)   Chief Complaint:  Hospitalization Follow-up (04/27/2023 - 04/28/2023 (25 hours)/[facility]- a fib  )   HPI: Norma Barajas is a 82 y.o. female presenting on 05/05/2023 for Hospitalization Follow-up (04/27/2023 - 04/28/2023 (25 hours)/ HOSPITAL- a fib  )  She reports she has been doing well since discharge from the hospital but is having trouble sleeping and trouble hearing. She started staying at [facility] Friday and reports she is doing well there, feels like she will like it. She states her heart rate has been well controlled since discharge, she will have intermittent palpitations with minimal shortness of breath after an activity. States this resolves quickly after rest. She is only on ASA 81 mg twice daily due to high risk for falls and bleeding. She states she has not been able to sleep well over the last few days and feels this is worsening. Denies excessive caffeine use. No stimulant use. Was on benzo in the past but has not tried trazodone. Has tried melatonin and benadryl without resolution of insomnia. Her hearing is declining and she would like to have this checked.    Relevant past medical, surgical, family, and social history reviewed and updated as indicated.  Allergies and medications reviewed and updated. Data reviewed: Chart in Epic.   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 Nonobstructive minimal CAD 10/2005.   Depression    Diastolic dysfunction    Grade 1. Ejection fraction 60-65%.   Dysrhythmia    a fib   Erosive esophagitis    Essential hypertension    Fracture of ramus of right pubis with routine healing 12/09/2020   GERD (gastroesophageal reflux disease)    Grade III hemorrhoids    History of hiatal hernia    History of prediabetes    Hypercholesterolemia    Internal hemorrhoids with complication 07/29/2008   OCT 2015 FLEX SIG/IH BANDING     Intertrochanteric fracture of femur (HCC)    5/16 - 03/26/2021 fracture sustained in a mechanical fall.  IM nailing performed 5/17 by Dr Thane Edu Warfarin held because of posttraumatic pelvic hematoma.  Postop DVT with 81 mg aspirin twice daily x28 days.   Major depressive disorder, recurrent, moderate (HCC) 12/09/2020   Migraine    "used to have them right bad; I don't now" (04/11/2015)   MITRAL REGURGITATION 04/27/2010   Qualifier: Diagnosis of  By: Eden Emms, MD, Harrington Challenger    Osteoporosis    Paroxysmal atrial fibrillation [facility])    Pelvic hematoma, female 03/27/2021   Sustained in mechanical fall.  Warfarin held.  IM nailing performed 5/17 with postop DVT prophylaxis 81 mg twice daily x28 days.   PONV (postoperative nausea and vomiting)    Vertigo     Past Surgical History:  Procedure Laterality Date   BRAVO [facility]  STUDY  11/15/2012   Procedure: BRAVO PH STUDY;  Surgeon: West Bali, MD;  Location: [facility] ENDO SUITE;  Service: Endoscopy;;   CARDIOVERSION N/A 05/16/2020   Procedure: CARDIOVERSION;  Surgeon: Little Ishikawa, MD;  Location: [facility] ENDOSCOPY;  Service: Cardiovascular;  Laterality: N/A;   CATARACT EXTRACTION W/ INTRAOCULAR LENS  IMPLANT, BILATERAL Bilateral    COLONOSCOPY  2008   Dr. Darrick Penna: internal hemorrhoids    DILATION AND CURETTAGE OF UTERUS     ESOPHAGEAL DILATION N/A 06/16/2021   Procedure: ESOPHAGEAL DILATION;  Surgeon:  Dolores Frame, MD;  Location: [facility] ENDO SUITE;  Service: Gastroenterology;  Laterality: N/A;   ESOPHAGOGASTRODUODENOSCOPY (EGD) WITH ESOPHAGEAL DILATION  2001   Dr. Arlyce Dice: erosive esophagitis, esophageal stricture, duodenitis, s/p Savary dilation   ESOPHAGOGASTRODUODENOSCOPY (EGD) WITH ESOPHAGEAL DILATION  11/15/2012   [id number] web was found & MOST LIKELY CAUSE FOR DYAPHAGIA/Polyp was found in the gastric body and gastric fundus/ gastritis on bx   ESOPHAGOGASTRODUODENOSCOPY (EGD) WITH PROPOFOL N/A 06/16/2021   Procedure: ESOPHAGOGASTRODUODENOSCOPY (EGD) WITH PROPOFOL;  Surgeon: Dolores Frame, MD;  Location: [facility];  Service: Gastroenterology;  Laterality: N/A;  8:15   EYE SURGERY Bilateral    "laser OR after cataract OR; cause I couldn't see"   FLEXIBLE SIGMOIDOSCOPY N/A 08/22/2014   mild diverticulosis in sigmoid, moderate sized Grade 3 hemorrhoids s/p banding X 3.    FRACTURE SURGERY Right    below the knee - 2 bones broke has plates   HEMORRHOID BANDING N/A 08/22/2014   Procedure: HEMORRHOID BANDING;  Surgeon: West Bali, MD;  Location: [facility];  Service: Endoscopy;  Laterality: N/A;   HEMORRHOID SURGERY N/A 03/30/2018   Procedure: EXTENSIVE HEMORRHOIDECTOMY;  Surgeon: Lucretia Roers, MD;  Location: [facility] ORS;  Service: General;  Laterality: N/A;   INTRAMEDULLARY (IM) NAIL INTERTROCHANTERIC Right 03/24/2021   Procedure: INTRAMEDULLARY (IM) NAIL INTERTROCHANTRIC;  Surgeon: Oliver Barre, MD;  Location: [facility] ORS;  Service: Orthopedics;  Laterality: Right;   OPEN REDUCTION INTERNAL FIXATION (ORIF) TIBIA/FIBULA FRACTURE Right 2013   broke tibia and fibula after falling down stairs   PROLAPSED UTERINE FIBROID LIGATION  2015   TOTAL ABDOMINAL HYSTERECTOMY      Social History   Socioeconomic History   Marital status: Widowed    Spouse name: george    Number of children: 1   Years of education: Not on file   Highest education level: Not on file   Occupational History   Occupation: Retired    Comment: Textile  Tobacco Use   Smoking status: Never   Smokeless tobacco: Never  Vaping Use   Vaping Use: Never used  Substance and Sexual Activity   Alcohol use: No    Alcohol/week: 0.0 standard drinks of alcohol   Drug use: No   Sexual activity: Not Currently    Birth control/protection: Surgical, Post-menopausal    Comment: hyst  Other Topics Concern   Not on file  Social History Narrative   Married   No regular exercise   Social Determinants of Health   Financial Resource Strain: Not on file  Food Insecurity: No Food Insecurity (04/27/2023)   Hunger Vital Sign    Worried About Running Out of Food in the Last Year: Never true    Ran Out of Food in the Last Year: Never true  Transportation Needs: No Transportation Needs (04/27/2023)   PRAPARE - Administrator, Civil Service (Medical): No    Lack of Transportation (Non-Medical): No  Physical Activity: Not on file  Stress: Not on file  Social Connections: Not on file  Intimate Partner Violence: Not At Risk (04/27/2023)   Humiliation, Afraid, Rape, and Kick questionnaire    Fear of Current or Ex-Partner: No    Emotionally Abused: No    Physically Abused: No    Sexually Abused: No    Outpatient Encounter Medications as of 05/05/2023  Medication Sig   acetaminophen (TYLENOL) 500 MG tablet Take 2 tablets (1,000 mg total) by mouth every 6 (six) hours as needed for mild pain.   amLODipine (NORVASC) 5 MG tablet Take 0.5 tablets (2.5 mg total) by mouth daily.   ASPIRIN LOW DOSE 81 MG tablet TAKE 1 TABLET TWICE DAILY (Patient taking differently: Take 81 mg by mouth every morning.)   bisacodyl (DULCOLAX) 10 MG suppository Place 1 suppository (10 mg total) rectally as needed for moderate constipation.   cholecalciferol (VITAMIN D3) 25 MCG (1000 UNIT) tablet Take 1,000 Units by mouth in the morning.   docusate sodium (COLACE) 100 MG capsule Take 1 capsule (100 mg total) by  mouth every morning.   metoprolol tartrate (LOPRESSOR) 100 MG tablet Take 1 tablet (100 mg total) by mouth 2 (two) times daily.   omeprazole (PRILOSEC) 20 MG capsule TAKE 1 CAPSULE EVERY DAY   simvastatin (ZOCOR) 20 MG tablet Take 1 tablet (20 mg total) by mouth daily at 6 PM.   torsemide (DEMADEX) 20 MG tablet Take 1 tablet (20 mg total) by mouth 2 (two) times daily.   traZODone (DESYREL) 50 MG tablet Take 0.5-1 tablets (25-50 mg total) by mouth at bedtime as needed for sleep.   No facility-administered encounter medications on file as of 05/05/2023.    Allergies  Allergen Reactions   Codeine Nausea And Vomiting   Doxycycline Other (See Comments)    Chest congestion   Triamterene-Hctz Other (See Comments)    weakness   Atorvastatin Other (See Comments)    Myalgias    Crestor [Rosuvastatin Calcium] Other (See Comments)    weakness   Morphine Nausea Only   Risedronate Sodium Other (See Comments)    ACTONEL - reflux    Review of Systems  Constitutional:  Negative for activity change, appetite change, chills, diaphoresis, fatigue, fever and unexpected weight change.  HENT:  Positive for hearing loss.   Eyes: Negative.  Negative for photophobia and visual disturbance.  Respiratory:  Negative for cough, chest tightness and shortness of breath.   Cardiovascular:  Negative for chest pain, palpitations and leg swelling.  Gastrointestinal:  Negative for abdominal pain, blood in stool, constipation, diarrhea, nausea and vomiting.  Endocrine: Negative.   Genitourinary:  Negative for decreased urine volume, difficulty urinating, dysuria, frequency and urgency.  Musculoskeletal:  Positive for arthralgias, back pain and gait problem. Negative for myalgias.  Skin: Negative.   Allergic/Immunologic: Negative.   Neurological:  Negative for dizziness, tremors, seizures, syncope, facial asymmetry, speech difficulty, weakness, light-headedness, numbness and headaches.  Hematological: Negative.    Psychiatric/Behavioral:  Positive for sleep disturbance. Negative for agitation, behavioral problems, confusion, decreased concentration, dysphoric mood, hallucinations, self-injury and suicidal ideas. The patient is not nervous/anxious and is not hyperactive.         Objective:  BP 134/84   Pulse 93   Temp 97.7 F (36.5 C) (Temporal)   Ht 4\' 8"  (1.422 m)   Wt 105 lb 12.8 oz (48 kg)   SpO2 95%   BMI 23.72 kg/m    Wt Readings from Last 3 Encounters:  05/05/23 105 lb 12.8 oz (48 kg)  04/28/23 104 lb 4.4 oz (47.3 kg)  04/20/23 106 lb (48.1 kg)    Physical Exam Vitals and nursing note reviewed.  Constitutional:      General: She is not in acute distress.    Appearance: She is well-developed and well-groomed. She is ill-appearing (chronically ill). She is not toxic-appearing or diaphoretic.  HENT:     Head: Normocephalic and atraumatic.     Jaw: There is normal jaw occlusion.     Right Ear: Tympanic membrane, ear canal and external ear normal. Decreased hearing noted. There is no impacted cerumen.     Left Ear: Tympanic membrane, ear canal and external ear normal. Decreased hearing noted. There is no impacted cerumen.     Nose: Nose normal.     Mouth/Throat:     Lips: Pink.     Mouth: Mucous membranes are moist.     Pharynx: Uvula midline.  Eyes:     General: Lids are normal.     Conjunctiva/sclera: Conjunctivae normal.     Pupils: Pupils are equal, round, and reactive to light.  Neck:     Thyroid: No thyroid mass, thyromegaly or thyroid tenderness.     Vascular: Carotid bruit present. No JVD.     Trachea: Trachea and phonation normal.  Cardiovascular:     Rate and Rhythm: Normal rate. Rhythm irregularly irregular.     Chest Wall: PMI is not displaced.     Pulses: Normal pulses.     Heart sounds: Murmur heard.     Systolic murmur is present with a grade of 3/6.     No friction rub. No gallop.  Pulmonary:     Effort: Pulmonary effort is normal. No respiratory  distress.     Breath sounds: Normal breath sounds. No wheezing.  Abdominal:     General: Bowel sounds are normal. There is no abdominal bruit.     Palpations: Abdomen is soft. There is no hepatomegaly or splenomegaly.  Musculoskeletal:     Cervical back: Normal range of motion and neck supple.     Right lower leg: No edema.     Left lower leg: No edema.  Lymphadenopathy:     Cervical: No cervical adenopathy.  Skin:    General: Skin is warm and dry.     Capillary Refill: Capillary refill takes less than 2 seconds.     Coloration: Skin is not cyanotic, jaundiced or pale.     Findings: Bruising (scattered to bilateral upper arms) present. No rash.  Neurological:     General: No focal deficit present.     Mental Status: She is alert and oriented to person, place, and time.     Sensory: Sensation is intact.     Motor: Motor function is intact.     Coordination: Coordination is intact.     Gait: Gait abnormal (antalgic, using walker).     Deep Tendon Reflexes: Reflexes are normal and symmetric.  Psychiatric:        Attention and Perception: Attention and perception normal.        Mood and Affect: Mood and affect normal.        Speech: Speech normal.        Behavior: Behavior normal. Behavior is cooperative.        Thought Content: Thought content normal.        Cognition and Memory: Cognition and memory normal.        Judgment: Judgment normal.  Results for orders placed or performed during the hospital encounter of 04/27/23  Basic metabolic panel  Result Value Ref Range   Sodium 135 135 - 145 mmol/L   Potassium 3.8 3.5 - 5.1 mmol/L   Chloride 97 (L) 98 - 111 mmol/L   CO2 27 22 - 32 mmol/L   Glucose, Bld 188 (H) 70 - 99 mg/dL   BUN 32 (H) 8 - 23 mg/dL   Creatinine, Ser 1.61 (H) 0.44 - 1.00 mg/dL   Calcium 8.5 (L) 8.9 - 10.3 mg/dL   GFR, Estimated 52 (L) >60 mL/min   Anion gap 11 5 - 15  CBC  Result Value Ref Range   WBC 7.0 4.0 - 10.5 K/uL   RBC 4.40 3.87 - 5.11 MIL/uL    Hemoglobin 14.3 12.0 - 15.0 g/dL   HCT 09.6 04.5 - 40.9 %   MCV 99.1 80.0 - 100.0 fL   MCH 32.5 26.0 - 34.0 pg   MCHC 32.8 30.0 - 36.0 g/dL   RDW 81.1 91.4 - 78.2 %   Platelets 149 (L) 150 - 400 K/uL   nRBC 0.0 0.0 - 0.2 %  Hepatic function panel  Result Value Ref Range   Total Protein 6.8 6.5 - 8.1 g/dL   Albumin 3.7 3.5 - 5.0 g/dL   AST 29 15 - 41 U/L   ALT 19 0 - 44 U/L   Alkaline Phosphatase 92 38 - 126 U/L   Total Bilirubin 0.7 0.3 - 1.2 mg/dL   Bilirubin, Direct 0.1 0.0 - 0.2 mg/dL   Indirect Bilirubin 0.6 0.3 - 0.9 mg/dL  Urinalysis, Routine w reflex microscopic -Urine, Clean Catch  Result Value Ref Range   Color, Urine STRAW (A) YELLOW   APPearance CLEAR CLEAR   Specific Gravity, Urine 1.006 1.005 - 1.030   pH 5.0 5.0 - 8.0   Glucose, UA NEGATIVE NEGATIVE mg/dL   Hgb urine dipstick NEGATIVE NEGATIVE   Bilirubin Urine NEGATIVE NEGATIVE   Ketones, ur NEGATIVE NEGATIVE mg/dL   Protein, ur NEGATIVE NEGATIVE mg/dL   Nitrite NEGATIVE NEGATIVE   Leukocytes,Ua NEGATIVE NEGATIVE  Basic metabolic panel  Result Value Ref Range   Sodium 140 135 - 145 mmol/L   Potassium 3.9 3.5 - 5.1 mmol/L   Chloride 101 98 - 111 mmol/L   CO2 27 22 - 32 mmol/L   Glucose, Bld 81 70 - 99 mg/dL   BUN 19 8 - 23 mg/dL   Creatinine, Ser 9.56 0.44 - 1.00 mg/dL   Calcium 8.9 8.9 - 21.3 mg/dL   GFR, Estimated >08 >65 mL/min   Anion gap 12 5 - 15  Magnesium  Result Value Ref Range   Magnesium 2.1 1.7 - 2.4 mg/dL  Phosphorus  Result Value Ref Range   Phosphorus 3.3 2.5 - 4.6 mg/dL  Troponin I (High Sensitivity)  Result Value Ref Range   Troponin I (High Sensitivity) 4 <18 ng/L  Troponin I (High Sensitivity)  Result Value Ref Range   Troponin I (High Sensitivity) 5 <18 ng/L       Pertinent labs & imaging results that were available during my care of the patient were reviewed by me and considered in my medical decision making.  Assessment & Plan:  Coraleigh was seen today for hospitalization  follow-up.  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 The patient  was discharged from [facility] on 04/28/2023 with a primary diagnosis of A-Fib with RVR, chronic congestive heart failure with right ventricular diastolic dysfunction.  Through chart review and discussion with the patient I have determined that management of their condition is of high complexity.  -     BMP8+EGFR -     CBC with Differential/Platelet -     Thyroid Panel With TSH  Primary insomnia Sleep hygiene discussed in detail. Will check to make sure thyroid function is normal. Will trial below. Sedation precautions discussed. -     traZODone (DESYREL) 50 MG tablet; Take 0.5-1 tablets (25-50 mg total) by mouth at bedtime as needed for sleep. -     Thyroid Panel With TSH  Mixed conductive and sensorineural hearing loss of both ears Will refer to audiology.  -     Ambulatory referral to Audiology     Continue all other maintenance medications.  Follow up plan: Return if symptoms worsen or fail to improve.   Continue healthy lifestyle choices, including diet (rich in fruits, vegetables, and lean proteins, and low in salt and simple carbohydrates) and exercise (at least 30 minutes of moderate physical activity daily).  Educational handout given for insomnia  The above assessment and management plan was discussed with the patient. The patient verbalized understanding of and has agreed to the management plan. Patient is aware to call the clinic if they develop any new symptoms or if symptoms persist or worsen. Patient is aware when to return to the clinic for a follow-up visit. Patient educated on when it is appropriate to go to the emergency department.   Kari Baars, FNP-C [facility] [phone]

## 2023-05-06 LAB — CBC WITH DIFFERENTIAL/PLATELET
Basophils Absolute: 0 10*3/uL (ref 0.0–0.2)
Basos: 1 %
EOS (ABSOLUTE): 0.1 10*3/uL (ref 0.0–0.4)
Hematocrit: 42.7 % (ref 34.0–46.6)
Immature Grans (Abs): 0 10*3/uL (ref 0.0–0.1)
Immature Granulocytes: 0 %
Lymphs: 19 %
MCH: 32.9 pg (ref 26.6–33.0)
MCHC: 34.2 g/dL (ref 31.5–35.7)
MCV: 96 fL (ref 79–97)
Monocytes Absolute: 0.7 10*3/uL (ref 0.1–0.9)
Monocytes: 10 %
Neutrophils Absolute: 4.3 10*3/uL (ref 1.4–7.0)
Neutrophils: 68 %
Platelets: 157 10*3/uL (ref 150–450)
WBC: 6.4 10*3/uL (ref 3.4–10.8)

## 2023-05-06 LAB — BMP8+EGFR
BUN/Creatinine Ratio: 25 (ref 12–28)
CO2: 28 mmol/L (ref 20–29)
Calcium: 9.2 mg/dL (ref 8.7–10.3)
Chloride: 95 mmol/L — ABNORMAL LOW (ref 96–106)
Creatinine, Ser: 1.24 mg/dL — ABNORMAL HIGH (ref 0.57–1.00)
Potassium: 4 mmol/L (ref 3.5–5.2)
Sodium: 140 mmol/L (ref 134–144)
eGFR: 43 mL/min/{1.73_m2} — ABNORMAL LOW (ref >59)

## 2023-05-06 LAB — THYROID PANEL WITH TSH
Free Thyroxine Index: 2.1 (ref 1.2–4.9)
T3 Uptake Ratio: 29 % (ref 24–39)
TSH: 1.64 u[IU]/mL (ref 0.450–4.500)

## 2023-05-10 ENCOUNTER — Ambulatory Visit (INDEPENDENT_AMBULATORY_CARE_PROVIDER_SITE_OTHER): Payer: Medicare HMO

## 2023-05-10 DIAGNOSIS — Z111 Encounter for screening for respiratory tuberculosis: Secondary | ICD-10-CM

## 2023-05-10 NOTE — Progress Notes (Signed)
PPD skin test placed left forearm.

## 2023-05-13 ENCOUNTER — Ambulatory Visit: Payer: Medicare HMO | Attending: Nurse Practitioner | Admitting: Nurse Practitioner

## 2023-05-13 ENCOUNTER — Encounter: Payer: Self-pay | Admitting: Nurse Practitioner

## 2023-05-13 ENCOUNTER — Other Ambulatory Visit: Payer: Self-pay | Admitting: Nurse Practitioner

## 2023-05-13 ENCOUNTER — Ambulatory Visit: Payer: Medicare HMO

## 2023-05-13 VITALS — BP 130/80 | HR 95 | Ht <= 58 in | Wt 111.4 lb

## 2023-05-13 DIAGNOSIS — E782 Mixed hyperlipidemia: Secondary | ICD-10-CM | POA: Diagnosis not present

## 2023-05-13 DIAGNOSIS — I2510 Atherosclerotic heart disease of native coronary artery without angina pectoris: Secondary | ICD-10-CM | POA: Diagnosis not present

## 2023-05-13 DIAGNOSIS — I10 Essential (primary) hypertension: Secondary | ICD-10-CM

## 2023-05-13 DIAGNOSIS — R0609 Other forms of dyspnea: Secondary | ICD-10-CM | POA: Diagnosis not present

## 2023-05-13 DIAGNOSIS — E781 Pure hyperglyceridemia: Secondary | ICD-10-CM

## 2023-05-13 DIAGNOSIS — I519 Heart disease, unspecified: Secondary | ICD-10-CM | POA: Diagnosis not present

## 2023-05-13 DIAGNOSIS — I482 Chronic atrial fibrillation, unspecified: Secondary | ICD-10-CM | POA: Diagnosis not present

## 2023-05-13 DIAGNOSIS — Z8679 Personal history of other diseases of the circulatory system: Secondary | ICD-10-CM

## 2023-05-13 DIAGNOSIS — R7989 Other specified abnormal findings of blood chemistry: Secondary | ICD-10-CM

## 2023-05-13 DIAGNOSIS — I38 Endocarditis, valve unspecified: Secondary | ICD-10-CM | POA: Diagnosis not present

## 2023-05-13 LAB — TB SKIN TEST
Induration: 0 mm
TB Skin Test: NEGATIVE

## 2023-05-13 MED ORDER — CARVEDILOL 12.5 MG PO TABS: 12.5000 mg | ORAL_TABLET | Freq: Two times a day (BID) | ORAL | 0 refills | Status: DC

## 2023-05-13 MED ORDER — CARVEDILOL 12.5 MG PO TABS: 12.5000 mg | ORAL_TABLET | Freq: Two times a day (BID) | ORAL | 1 refills | Status: DC

## 2023-05-13 NOTE — Patient Instructions (Addendum)
Medication Instructions:  Your physician has recommended you make the following change in your medication:  Stop taking Metoprolol  Start taking Carvedilol 12.5 Mg Twice daily Continue all other medications as prescribed.  Labwork: none  Testing/Procedures: none  Follow-Up: Your physician recommends that you schedule a follow-up appointment in: 3 weeks with Philis Nettle  Any Other Special Instructions Will Be Listed Below (If Applicable).  If you need a refill on your cardiac medications before your next appointment, please call your pharmacy.

## 2023-05-13 NOTE — Progress Notes (Unsigned)
Cardiology Office Note:  .   Date:  05/13/2023 ID:  Norma Barajas, DOB 01/26/1941, MRN [id number] PCP: Sonny Masters, FNP  [facility] Providers Cardiologist:  Rollene Rotunda, MD    History of Present Illness: .   Norma Barajas is a 82 y.o. female with a PMH of chronic A-fib, HTN, coronary artery atherosclerosis, mixed HLD, pulmonary HTN, and MR, who presents today for hospital follow-up.   Hospitalized 04/2023 for A-fib with RVR. Found to be hypotensive, given IV fluids. Metoprolol increased to 100 mg BID. D/C to [facility].   Today she presents for hospital follow-up. She states she is doing well. Does endorse sense of palpitations with exertion, particularly noticed when walking to the dining hall at ALF, noticed DOE along with this. Denies any chest pain, syncope, presyncope, dizziness, orthopnea, PND, swelling or significant weight changes, acute bleeding, or claudication.  Studies Reviewed: Marland Kitchen    EKG Interpretation Date/Time:  Friday May 13 2023 15:19:44 EDT Ventricular Rate:  98 PR Interval:    QRS Duration:  74 QT Interval:  356 QTC Calculation: 454 R Axis:   60  Text Interpretation: Atrial fibrillation with premature ventricular or aberrantly conducted complexes When compared with ECG of 27-Apr-2023 14:23, PREVIOUS ECG IS PRESENT Confirmed by Sharlene Dory [phone]) on 05/13/2023 3:21:33 PM   Echo 04/2023: 1. Left ventricular ejection fraction, by estimation, is approximately  55%. The left ventricle has normal function. The left ventricle has no  regional wall motion abnormalities. There is mild asymmetric left  ventricular hypertrophy of the basal segment.   Left ventricular diastolic parameters are indeterminate. There is the  interventricular septum is flattened in diastole ('D' shaped left  ventricle), consistent with right ventricular volume overload.   2. Right ventricular systolic function is mildly reduced. The right  ventricular size is moderately enlarged.  There is normal pulmonary artery  systolic pressure. The estimated right ventricular systolic pressure is  30.1 mmHg.   3. Left atrial size was severely dilated.   4. Right atrial size was severely dilated.   5. The mitral valve is grossly normal. Mild mitral valve regurgitation.   6. Tricuspid valve mildly thickened with incomplete coaptation likely  related to annular dilatation. The tricuspid valve is abnormal. Tricuspid  valve regurgitation is severe.   7. The aortic valve is tricuspid. Aortic valve regurgitation is mild.  Aortic valve sclerosis/calcification is present, without any evidence of  aortic stenosis.   8. The inferior vena cava is dilated in size with >50% respiratory  variability, suggesting right atrial pressure of 8 mmHg.   Comparison(s): No significant change from prior study. Prior images  reviewed side by side.  Lexiscan 04/2015: The left ventricular ejection fraction is hyperdynamic (>65%). There was no ST segment deviation noted during stress. The study is normal. This is a low risk study.   Risk Assessment/Calculations:    CHA2DS2-VASc Score = 4  } This indicates a 4.8% annual risk of stroke. The patient's score is based upon: CHF History: 0 HTN History: 1 Diabetes History: 0 Stroke History: 0 Vascular Disease History: 0 Age Score: 2 Gender Score: 1       Physical Exam:   VS:  BP 130/80   Pulse 95   Ht 4\' 8"  (1.422 m)   Wt 111 lb 6.4 oz (50.5 kg)   SpO2 96%   BMI 24.98 kg/m    Wt Readings from Last 3 Encounters:  05/13/23 111 lb 6.4 oz (50.5 kg)  05/05/23 105 lb 12.8 oz (48 kg)  04/28/23 104 lb 4.4 oz (47.3 kg)    GEN: Well nourished, well developed in no acute distress NECK: No JVD; No carotid bruits CARDIAC: S1/S2, irregular rhythm and regular rate, no murmurs, rubs, gallops RESPIRATORY:  Clear to auscultation without rales, wheezing or rhonchi  ABDOMEN: Soft, non-tender, non-distended EXTREMITIES:  No edema; No deformity    ASSESSMENT AND PLAN: .    Chronic/Permanent A-fib EKG today reveals rate controlled A-fib, HR upper 90's. Hx of previous cardioversions, refuses additional DCCV's. Unsure when Amiodarone was stopped. Failed sotalol, reported cost issues with dofetilide. Does endorse palpitations and tachycardia with exertion while on increased dose of Metoprolol, will focus on better rate control. Previously reports in chart reveal she has done well on Coreg in the past. Will stop Metoprolol and switch to to Coreg 12.5 mg BID. Currently not on OAC d/t high fall risk. Heart healthy diet encouraged.   Valvular insufficiency TTE  04/2023 revealed mild MR, mild AR, and severe TR d/t mildly thickened TV with incomplete coaptation likely r/t annular dilatation. Not a surgical candidate. Switching Metoprolol to Coreg as mentioned above. No other medication changes at this time. Heart healthy diet encouraged.   HTN BP at goal. Discussed to monitor BP at home at least 2 hours after medications and sitting for 5-10 minutes. Switching Metoprolol to Coreg as mentioned above. No other medication changes. Will discuss obtaining BMET at next OV. Heart healthy diet encouraged.   Coronary atherosclerosis Stable with no anginal symptoms. No indication for ischemic evaluation. Continue Aspirin, simvastatin, and will switch Metoprolol to Coreg. Heart healthy diet encouraged.   Mixed HLD, hypertriglyceridemia LDL 88, TG 187 01/2023. Continue simvastatin. Heart healthy diet encouraged. Continue to follow with PCP.  Hx of pulmonary HTN, RV dysfunction, DOE Echo 04/2023 revealed normal PASP, with mildly reduced RVSF with evidence of RV volume overload. Appears this has been chronic over several years and has been reported to be stable. Euvolemic and well compensated on exam, does admit to DOE with extreme exertion, chronic and stable. Continue Torsemide and current medication regimen. Care and ED precautions discussed.   7. Elevated  serum creatinine Most recent sCr was 1.24 with eGFR at 43. Discussed adequate fluid intake. Plan to repeat BMET at next OV. Avoid nephrotoxic agents. Continue to follow with PCP.  Dispo: Follow-up with me or APP in 3 weeks or sooner if anything changes.   Signed, Sharlene Dory, NP

## 2023-05-19 ENCOUNTER — Emergency Department (HOSPITAL_COMMUNITY): Payer: Medicare HMO

## 2023-05-19 ENCOUNTER — Observation Stay (HOSPITAL_COMMUNITY)
Admission: EM | Admit: 2023-05-19 | Discharge: 2023-05-19 | Disposition: A | Discharge status: Home / Self Care | Payer: Medicare HMO | Attending: Family Medicine | Admitting: Family Medicine

## 2023-05-19 ENCOUNTER — Other Ambulatory Visit: Payer: Self-pay

## 2023-05-19 ENCOUNTER — Encounter (HOSPITAL_COMMUNITY): Payer: Self-pay | Admitting: *Deleted

## 2023-05-19 DIAGNOSIS — I480 Paroxysmal atrial fibrillation: Principal | ICD-10-CM | POA: Diagnosis present

## 2023-05-19 DIAGNOSIS — R0600 Dyspnea, unspecified: Secondary | ICD-10-CM | POA: Diagnosis not present

## 2023-05-19 DIAGNOSIS — I10 Essential (primary) hypertension: Secondary | ICD-10-CM | POA: Diagnosis not present

## 2023-05-19 DIAGNOSIS — E876 Hypokalemia: Secondary | ICD-10-CM | POA: Insufficient documentation

## 2023-05-19 DIAGNOSIS — N1831 Chronic kidney disease, stage 3a: Secondary | ICD-10-CM | POA: Diagnosis not present

## 2023-05-19 DIAGNOSIS — I5032 Chronic diastolic (congestive) heart failure: Secondary | ICD-10-CM | POA: Diagnosis present

## 2023-05-19 DIAGNOSIS — I130 Hypertensive heart and chronic kidney disease with heart failure and stage 1 through stage 4 chronic kidney disease, or unspecified chronic kidney disease: Secondary | ICD-10-CM | POA: Insufficient documentation

## 2023-05-19 DIAGNOSIS — I2510 Atherosclerotic heart disease of native coronary artery without angina pectoris: Secondary | ICD-10-CM | POA: Diagnosis not present

## 2023-05-19 DIAGNOSIS — I4891 Unspecified atrial fibrillation: Secondary | ICD-10-CM | POA: Diagnosis not present

## 2023-05-19 DIAGNOSIS — Z7982 Long term (current) use of aspirin: Secondary | ICD-10-CM | POA: Diagnosis not present

## 2023-05-19 DIAGNOSIS — I700 Atherosclerosis of aorta: Secondary | ICD-10-CM | POA: Diagnosis not present

## 2023-05-19 DIAGNOSIS — I499 Cardiac arrhythmia, unspecified: Secondary | ICD-10-CM | POA: Diagnosis not present

## 2023-05-19 DIAGNOSIS — Z79899 Other long term (current) drug therapy: Secondary | ICD-10-CM | POA: Insufficient documentation

## 2023-05-19 DIAGNOSIS — R000 Tachycardia, unspecified: Secondary | ICD-10-CM | POA: Diagnosis not present

## 2023-05-19 DIAGNOSIS — I517 Cardiomegaly: Secondary | ICD-10-CM | POA: Diagnosis not present

## 2023-05-19 DIAGNOSIS — R531 Weakness: Secondary | ICD-10-CM | POA: Diagnosis present

## 2023-05-19 DIAGNOSIS — N184 Chronic kidney disease, stage 4 (severe): Secondary | ICD-10-CM | POA: Diagnosis present

## 2023-05-19 DIAGNOSIS — F5101 Primary insomnia: Secondary | ICD-10-CM

## 2023-05-19 LAB — TROPONIN I (HIGH SENSITIVITY)
Troponin I (High Sensitivity): 5 ng/L (ref <18)
Troponin I (High Sensitivity): 35 ng/L — ABNORMAL HIGH (ref <18)

## 2023-05-19 LAB — CBC WITH DIFFERENTIAL/PLATELET
Abs Immature Granulocytes: 0.01 10*3/uL (ref 0.00–0.07)
Basophils Absolute: 0.1 10*3/uL (ref 0.0–0.1)
Basophils Relative: 1 %
Eosinophils Absolute: 0.1 10*3/uL (ref 0.0–0.5)
Eosinophils Relative: 2 %
HCT: 47.2 % — ABNORMAL HIGH (ref 36.0–46.0)
Hemoglobin: 15.9 g/dL — ABNORMAL HIGH (ref 12.0–15.0)
Immature Granulocytes: 0 %
Lymphocytes Relative: 22 %
Lymphs Abs: 1.2 10*3/uL (ref 0.7–4.0)
MCH: 33.3 pg (ref 26.0–34.0)
MCHC: 33.7 g/dL (ref 30.0–36.0)
MCV: 98.7 fL (ref 80.0–100.0)
Monocytes Absolute: 0.5 10*3/uL (ref 0.1–1.0)
Monocytes Relative: 10 %
Neutro Abs: 3.7 10*3/uL (ref 1.7–7.7)
Neutrophils Relative %: 65 %
Platelets: 137 10*3/uL — ABNORMAL LOW (ref 150–400)
RBC: 4.78 MIL/uL (ref 3.87–5.11)
RDW: 13.4 % (ref 11.5–15.5)
WBC: 5.6 10*3/uL (ref 4.0–10.5)
nRBC: 0 % (ref 0.0–0.2)

## 2023-05-19 LAB — COMPREHENSIVE METABOLIC PANEL
ALT: 15 U/L (ref 0–44)
AST: 24 U/L (ref 15–41)
Albumin: 4.3 g/dL (ref 3.5–5.0)
Alkaline Phosphatase: 95 U/L (ref 38–126)
Anion gap: 13 (ref 5–15)
BUN: 17 mg/dL (ref 8–23)
CO2: 29 mmol/L (ref 22–32)
Calcium: 9.3 mg/dL (ref 8.9–10.3)
Chloride: 94 mmol/L — ABNORMAL LOW (ref 98–111)
Creatinine, Ser: 0.83 mg/dL (ref 0.44–1.00)
GFR, Estimated: >60 mL/min (ref >60)
Glucose, Bld: 171 mg/dL — ABNORMAL HIGH (ref 70–99)
Potassium: 3.3 mmol/L — ABNORMAL LOW (ref 3.5–5.1)
Sodium: 136 mmol/L (ref 135–145)
Total Bilirubin: 1.2 mg/dL (ref 0.3–1.2)
Total Protein: 8 g/dL (ref 6.5–8.1)

## 2023-05-19 LAB — MAGNESIUM: Magnesium: 2.2 mg/dL (ref 1.7–2.4)

## 2023-05-19 MED ORDER — DILTIAZEM HCL 30 MG PO TABS: 30.0000 mg | ORAL_TABLET | Freq: Two times a day (BID) | ORAL | 2 refills | Status: DC | PRN

## 2023-05-19 MED ORDER — TRAZODONE HCL 50 MG PO TABS
50.0000 mg | ORAL_TABLET | Freq: Every day | ORAL | 3 refills | Status: DC
Start: 2023-05-19 — End: 2023-08-24

## 2023-05-19 MED ORDER — TRAZODONE HCL 50 MG PO TABS
50.0000 mg | ORAL_TABLET | Freq: Every day | ORAL | 3 refills | Status: DC
Start: 2023-05-19 — End: 2023-05-19

## 2023-05-19 MED ORDER — SODIUM CHLORIDE 0.9 % IV BOLUS
1000.0000 mL | Freq: Once | INTRAVENOUS | Status: AC
  Administered 2023-05-19: 1000 mL via INTRAVENOUS

## 2023-05-19 MED ORDER — TORSEMIDE 20 MG PO TABS
20.0000 mg | ORAL_TABLET | Freq: Two times a day (BID) | ORAL | 3 refills | Status: DC
Start: 2023-05-19 — End: 2023-06-22

## 2023-05-19 MED ORDER — DILTIAZEM LOAD VIA INFUSION
10.0000 mg | Freq: Once | INTRAVENOUS | Status: AC
  Administered 2023-05-19: 10 mg via INTRAVENOUS
  Filled 2023-05-19: qty 10

## 2023-05-19 MED ORDER — POTASSIUM CHLORIDE CRYS ER 20 MEQ PO TBCR
40.0000 meq | EXTENDED_RELEASE_TABLET | Freq: Once | ORAL | Status: DC
  Filled 2023-05-19: qty 2

## 2023-05-19 MED ORDER — METOPROLOL SUCCINATE ER 200 MG PO TB24: 200.0000 mg | ORAL_TABLET | Freq: Every evening | ORAL | 3 refills | Status: DC

## 2023-05-19 MED ORDER — MAGNESIUM OXIDE -MG SUPPLEMENT 400 (240 MG) MG PO TABS
800.0000 mg | ORAL_TABLET | Freq: Once | ORAL | Status: AC
  Administered 2023-05-19: 800 mg via ORAL
  Filled 2023-05-19: qty 2

## 2023-05-19 MED ORDER — DILTIAZEM HCL 30 MG PO TABS
30.0000 mg | ORAL_TABLET | Freq: Once | ORAL | Status: AC
  Administered 2023-05-19: 30 mg via ORAL
  Filled 2023-05-19: qty 1

## 2023-05-19 MED ORDER — METOPROLOL TARTRATE 50 MG PO TABS
100.0000 mg | ORAL_TABLET | Freq: Once | ORAL | Status: AC
  Administered 2023-05-19: 100 mg via ORAL
  Filled 2023-05-19: qty 2

## 2023-05-19 MED ORDER — IOHEXOL 350 MG/ML SOLN
75.0000 mL | Freq: Once | INTRAVENOUS | Status: AC | PRN
  Administered 2023-05-19: 75 mL via INTRAVENOUS

## 2023-05-19 MED ORDER — TORSEMIDE 20 MG PO TABS
20.0000 mg | ORAL_TABLET | Freq: Two times a day (BID) | ORAL | 3 refills | Status: DC
Start: 2023-05-19 — End: 2023-05-19

## 2023-05-19 MED ORDER — POTASSIUM CHLORIDE 20 MEQ PO PACK
40.0000 meq | PACK | Freq: Once | ORAL | Status: DC
  Administered 2023-05-19: 40 meq via ORAL
  Filled 2023-05-19: qty 2

## 2023-05-19 MED ORDER — DILTIAZEM HCL-DEXTROSE 125-5 MG/125ML-% IV SOLN (PREMIX)
5.0000 mg/h | INTRAVENOUS | Status: DC
  Administered 2023-05-19: 5 mg/h via INTRAVENOUS
  Filled 2023-05-19: qty 125

## 2023-05-19 MED ORDER — POTASSIUM CHLORIDE CRYS ER 20 MEQ PO TBCR
40.0000 meq | EXTENDED_RELEASE_TABLET | Freq: Once | ORAL | Status: AC
  Administered 2023-05-19: 40 meq via ORAL
  Filled 2023-05-19: qty 2

## 2023-05-19 MED ORDER — POTASSIUM CHLORIDE ER 10 MEQ PO TBCR: 10.0000 meq | EXTENDED_RELEASE_TABLET | Freq: Every day | ORAL | 2 refills | Status: DC

## 2023-05-19 MED ORDER — TORSEMIDE 20 MG PO TABS
40.0000 mg | ORAL_TABLET | Freq: Once | ORAL | Status: AC
  Administered 2023-05-19: 40 mg via ORAL
  Filled 2023-05-19: qty 2

## 2023-05-19 MED ORDER — METOPROLOL SUCCINATE ER 200 MG PO TB24: 200.0000 mg | ORAL_TABLET | Freq: Every evening | ORAL | 11 refills | Status: DC

## 2023-05-19 MED ORDER — POTASSIUM CHLORIDE 20 MEQ PO PACK: 40.0000 meq | PACK | Freq: Two times a day (BID) | ORAL | Status: DC

## 2023-05-19 NOTE — ED Notes (Signed)
ED TO INPATIENT HANDOFF REPORT  ED Nurse Name and Phone #: Wandra Mannan, Paramedic [phone]  S Name/Age/Gender Norma Barajas 82 y.o. female Room/Bed: APA02/APA02  Code Status   Code Status: Prior  Home/SNF/Other Skilled nursing facility Patient oriented to: self, place, time, and situation Is this baseline? Yes   Triage Complete: Triage complete  Chief Complaint Atrial fibrillation with RVR (HCC) [I48.91]  Triage Note Pt brought in by [facility] from [facility] for fast heartrate  Pt states she woke up not feeling well and staff found pt's heart rate to be in the 160's  EMS arrived and pt found to be in Afib w/RVR  Ems administered approximately 6mg  of Cardizem before pt stated she did not want any life saving measures because she wanted to be with her husband and son  Pt arrived and is alert and states "I just don't feel good"  Pt states she would like to have a DNR and paper given to Dr. Posey Rea to complete  Dr. Posey Rea spoke with pt about life saving measures and pt verbalized she did not want to be resuscitated   Dr. Posey Rea spoke with pt's niece to give her an update    Allergies Allergies  Allergen Reactions   Codeine Nausea And Vomiting   Doxycycline Other (See Comments)    Chest congestion   Triamterene-Hctz Other (See Comments)    weakness   Atorvastatin Other (See Comments)    Myalgias    Crestor [Rosuvastatin Calcium] Other (See Comments)    weakness   Morphine Nausea Only   Risedronate Sodium Other (See Comments)    ACTONEL - reflux    Level of Care/Admitting Diagnosis ED Disposition     ED Disposition  Admit   Condition  --   Comment  Hospital Area: [facility] [[id number]]  Level of Care: Stepdown [14]  Covid Evaluation: Asymptomatic - no recent exposure (last 10 days) testing not required  Diagnosis: Atrial fibrillation with RVR [facility]) [[id number]]  Admitting Physician: Marylyn Ishihara  Attending Physician: Marylyn Ishihara          B Medical/Surgery History Past Medical History:  Diagnosis Date   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 Nonobstructive minimal CAD 10/2005.   Depression    Diastolic dysfunction    Grade 1. Ejection fraction 60-65%.   Dysrhythmia    a fib   Erosive esophagitis    Essential hypertension    Fracture of ramus of right pubis with routine healing 12/09/2020   GERD (gastroesophageal reflux disease)    Grade III hemorrhoids    History of hiatal hernia    History of prediabetes    Hypercholesterolemia    Internal hemorrhoids with complication 07/29/2008   OCT 2015 FLEX SIG/IH BANDING     Intertrochanteric fracture of femur (HCC)    5/16 - 03/26/2021 fracture sustained in a mechanical fall.  IM nailing performed 5/17 by Dr Thane Edu Warfarin held because of posttraumatic pelvic hematoma.  Postop DVT with 81 mg aspirin twice daily x28 days.   Major depressive disorder, recurrent, moderate (HCC) 12/09/2020   Migraine    "used to have them right bad; I don't now" (04/11/2015)   MITRAL REGURGITATION 04/27/2010   Qualifier: Diagnosis of  By: Eden Emms,  MD, Harrington Challenger    Osteoporosis    Paroxysmal atrial fibrillation [facility])    Pelvic hematoma, female 03/27/2021   Sustained in mechanical fall.  Warfarin held.  IM nailing performed 5/17 with postop DVT prophylaxis 81 mg twice daily x28 days.   PONV (postoperative nausea and vomiting)    Vertigo    Past Surgical History:  Procedure Laterality Date   BRAVO [facility] STUDY  11/15/2012   Procedure: BRAVO PH STUDY;  Surgeon: West Bali, MD;  Location: [facility] ENDO SU[facility];  Service: Endoscopy;;   CARDIOVERSION N/A 05/16/2020   Procedure: CARDIOVERSION;  Surgeon: Little Ishikawa, MD;  Location: [facility] ENDOSCOPY;  Service:  Cardiovascular;  Laterality: N/A;   CATARACT EXTRACTION W/ INTRAOCULAR LENS  IMPLANT, BILATERAL Bilateral    COLONOSCOPY  2008   Dr. Darrick Penna: internal hemorrhoids    DILATION AND CURETTAGE OF UTERUS     ESOPHAGEAL DILATION N/A 06/16/2021   Procedure: ESOPHAGEAL DILATION;  Surgeon: Dolores Frame, MD;  Location: [facility] ENDO SUITE;  Service: Gastroenterology;  Laterality: N/A;   ESOPHAGOGASTRODUODENOSCOPY (EGD) WITH ESOPHAGEAL DILATION  2001   Dr. Arlyce Dice: erosive esophagitis, esophageal stricture, duodenitis, s/p Savary dilation   ESOPHAGOGASTRODUODENOSCOPY (EGD) WITH ESOPHAGEAL DILATION  11/15/2012   [id number] web was found & MOST LIKELY CAUSE FOR DYAPHAGIA/Polyp was found in the gastric body and gastric fundus/ gastritis on bx   ESOPHAGOGASTRODUODENOSCOPY (EGD) WITH PROPOFOL N/A 06/16/2021   Procedure: ESOPHAGOGASTRODUODENOSCOPY (EGD) WITH PROPOFOL;  Surgeon: Dolores Frame, MD;  Location[facility];  Service: Gastroenterology;  Laterality: N/A;  8:15   EYE SURGERY Bilateral    "laser OR after cataract OR; cause I couldn't see"   FLEXIBLE SIGMOIDOSCOPY N/A 08/22/2014   mild diverticulosis in sigmoid, moderate sized Grade 3 hemorrhoids s/p banding X 3.    FRACTURE SURGERY Right    below the knee - 2 bones broke has plates   HEMORRHOID BANDING N/A 08/22/2014   Procedure: HEMORRHOID BANDING;  Surgeon: West Bali, MD;  Location[facility];  Service: Endoscopy;  Laterality: N/A;   HEMORRHOID SURGERY N/A 03/30/2018   Procedure: EXTENSIVE HEMORRHOIDECTOMY;  Surgeon: Lucretia Roers, MD;  Location: [facility] ORS;  Service: General;  Laterality: N/A;   INTRAMEDULLARY (IM) NAIL INTERTROCHANTERIC Right 03/24/2021   Procedure: INTRAMEDULLARY (IM) NAIL INTERTROCHANTRIC;  Surgeon: Oliver Barre, MD;  Location: [facility] ORS;  Service: Orthopedics;  Laterality: Right;   OPEN REDUCTION INTERNAL FIXATION (ORIF) TIBIA/FIBULA FRACTURE Right 2013   broke tibia and fibula after falling down  stairs   PROLAPSED UTERINE FIBROID LIGATION  2015   TOTAL ABDOMINAL HYSTERECTOMY       A IV Location/Drains/Wounds Patient Lines/Drains/Airways Status     Active Line/Drains/Airways     Name Placement date Placement time Site Days   Peripheral IV 05/19/23 18 G 1.16" Anterior;Proximal;Right Forearm 05/19/23  0832  Forearm  less than 1   Peripheral IV 05/19/23 18 G 1.16" Left Antecubital 05/19/23  0834  Antecubital  less than 1            Intake/Output Last 24 hours No intake or output data in the 24 hours ending 05/19/23 1326  Labs/Imaging Results for orders placed or performed during the hospital encounter of 05/19/23 (from the past 48 hour(s))  Comprehensive metabolic panel     Status: Abnormal   Collection Time: 05/19/23  8:26 AM  Result Value Ref Range   Sodium 136 135 - 145 mmol/L   Potassium 3.3 (L) 3.5 - 5.1 mmol/L  Chloride 94 (L) 98 - 111 mmol/L   CO2 29 22 - 32 mmol/L   Glucose, Bld 171 (H) 70 - 99 mg/dL    Comment: Glucose reference range applies only to samples taken after fasting for at least 8 hours.   BUN 17 8 - 23 mg/dL   Creatinine, Ser 4.09 0.44 - 1.00 mg/dL   Calcium 9.3 8.9 - 81.1 mg/dL   Total Protein 8.0 6.5 - 8.1 g/dL   Albumin 4.3 3.5 - 5.0 g/dL   AST 24 15 - 41 U/L   ALT 15 0 - 44 U/L   Alkaline Phosphatase 95 38 - 126 U/L   Total Bilirubin 1.2 0.3 - 1.2 mg/dL   GFR, Estimated >91 >47 mL/min    Comment: (NOTE) Calculated using the CKD-EPI Creatinine Equation (2021)    Anion gap 13 5 - 15    Comment: Performed at [facility], [address], [place], [place] [address]  CBC with Differential     Status: Abnormal   Collection Time: 05/19/23  8:26 AM  Result Value Ref Range   WBC 5.6 4.0 - 10.5 K/uL   RBC 4.78 3.87 - 5.11 MIL/uL   Hemoglobin 15.9 (H) 12.0 - 15.0 g/dL   HCT 21.3 (H) 08.6 - 57.8 %   MCV 98.7 80.0 - 100.0 fL   MCH 33.3 26.0 - 34.0 pg   MCHC 33.7 30.0 - 36.0 g/dL   RDW 46.9 62.9 - 52.8 %   Platelets 137 (L) 150 - 400 K/uL    nRBC 0.0 0.0 - 0.2 %   Neutrophils Relative % 65 %   Neutro Abs 3.7 1.7 - 7.7 K/uL   Lymphocytes Relative 22 %   Lymphs Abs 1.2 0.7 - 4.0 K/uL   Monocytes Relative 10 %   Monocytes Absolute 0.5 0.1 - 1.0 K/uL   Eosinophils Relative 2 %   Eosinophils Absolute 0.1 0.0 - 0.5 K/uL   Basophils Relative 1 %   Basophils Absolute 0.1 0.0 - 0.1 K/uL   Immature Granulocytes 0 %   Abs Immature Granulocytes 0.01 0.00 - 0.07 K/uL    Comment: Performed at [facility], [address], [place], [place] [address]  Troponin I (High Sensitivity)     Status: None   Collection Time: 05/19/23  8:26 AM  Result Value Ref Range   Troponin I (High Sensitivity) 5 <18 ng/L    Comment: (NOTE) Elevated high sensitivity troponin I (hsTnI) values and significant  changes across serial measurements may suggest ACS but many other  chronic and acute conditions are known to elevate hsTnI results.  Refer to the "Links" section for chest pain algorithms and additional  guidance. Performed at [facility], [address], [place], [place] [address]   Magnesium     Status: None   Collection Time: 05/19/23  8:32 AM  Result Value Ref Range   Magnesium 2.2 1.7 - 2.4 mg/dL    Comment: Performed at [facility], [address], [place], [place] [address]  Troponin I (High Sensitivity)     Status: Abnormal   Collection Time: 05/19/23 10:26 AM  Result Value Ref Range   Troponin I (High Sensitivity) 35 (H) <18 ng/L    Comment: DELTA CHECK NOTED RESULT CALLED TO, READ BACK BY AND VERIFIED WITH NICKERSON,M AT 11:05AM ON 05/19/23 BY FESTERMAN,C (NOTE) Elevated high sensitivity troponin I (hsTnI) values and significant  changes across serial measurements may suggest ACS but many other  chronic and acute conditions are known  to elevate hsTnI results.  Refer to the "Links" section for chest pain algorithms and additional  guidance. Performed at [facility], [address] [place], [place] [address]    CT Angio Chest PE W  and/or Wo Contrast  Result Date: 05/19/2023 CLINICAL DATA:  82 year old female with tachycardia, malaise. EXAM: CT ANGIOGRAPHY CHEST WITH CONTRAST TECHNIQUE: Multidetector CT imaging of the chest was performed using the standard protocol during bolus administration of intravenous contrast. Multiplanar CT image reconstructions and MIPs were obtained to evaluate the vascular anatomy. RADIATION DOSE REDUCTION: This exam was performed according to the departmental dose-optimization program which includes automated exposure control, adjustment of the mA and/or kV according to patient size and/or use of iterative reconstruction technique. CONTRAST:  75mL OMNIPAQUE IOHEXOL 350 MG/ML SOLN COMPARISON:  Portable chest 1137 hours today. Previous chest CTA 11/03/2005. FINDINGS: Cardiovascular: Excellent contrast bolus timing in the pulmonary arterial tree. But there is substantial reflux of contrast from the right heart into dilated IVC, dilated hepatic veins. Subsequently there is good central and main pulmonary artery opacification. But rapidly tapered appearance of the bilateral pulmonary artery branches and superimposed respiratory motion artifact. No pulmonary artery filling defect is identified, but the segmental and distal branches are not well evaluated. Cardiomegaly. Reflux of contrast from the right heart into the abdomen as stated above. No pericardial effusion. Calcified aortic atherosclerosis. No contrast in the aorta. Mediastinum/Nodes: Negative. Lungs/Pleura: Mild respiratory motion. Major airways are patent. Pulmonary hyperinflation with increased AP dimension to the chest. Only mild pulmonary septal thickening. No consolidation or ground-glass opacity. And trace if any pleural fluid. Upper Abdomen: Contrast reflux into the IVC and liver. Negative visible virtually noncontrast spleen, pancreas, adrenal glands, kidneys, and bowel in the upper abdomen. Musculoskeletal: Diffuse pronounced osteopenia. Thoracic  kyphoscoliosis and superimposed T5, T11, T12, L1 (vertebra plana) and other upper lumbar spine compression fractures. The T11 and lumbar vertebrae were compressed on a lumbar MRI last year, 01/12/2022. There are associated chronic posterior rib fractures including the left 8 through 12th ribs. Chronic sternal fracture. Review of the MIP images confirms the above findings. IMPRESSION: 1. 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 No PE is identified but the segmental and distal branches are not well evaluated. 2. Cardiomegaly and contrast reflux from the right heart. But no overt pulmonary edema. No pulmonary infection. Trace if any pleural fluid. 3. Severe osteopenia and widespread thoracic and lumbar compression fractures, chronic posterior rib fractures. Electronically Signed   By: Odessa Fleming M.D.   On: 05/19/2023 12:47   DG Chest Portable 1 View  Result Date: 05/19/2023 CLINICAL DATA:  Dyspnea, tachycardia EXAM: PORTABLE CHEST 1 VIEW COMPARISON:  Chest radiograph 04/27/2023, same day CTA chest. FINDINGS: The heart is enlarged.  The upper mediastinal contours are normal. There are mildly coarsened interstitial markings in both lungs. There is no focal consolidation or pulmonary edema. There is no pleural effusion or pneumothorax There is no acute osseous abnormality. IMPRESSION: No radiographic evidence of acute cardiopulmonary process. Electronically Signed   By: Lesia Hausen M.D.   On: 05/19/2023 12:13    Pending Labs Unresulted Labs (From admission, onward)    None       Vitals/Pain Today's Vitals   05/19/23 1115 05/19/23 1130 05/19/23 1245 05/19/23 1300  BP: 91/72 (!) 82/69 101/65 (!) 103/55  Pulse: 72 72 72 64  Resp: Marland Kitchen)  31     Temp:      TempSrc:      SpO2: 94% 94% 97% 98%  Weight:      Height:      PainSc:        Isolation  Precautions No active isolations  Medications Medications  diltiazem (CARDIZEM) 1 mg/mL load via infusion 10 mg (10 mg Intravenous Bolus from Bag 05/19/23 0833)    And  diltiazem (CARDIZEM) 125 mg in dextrose 5% 125 mL (1 mg/mL) infusion (0 mg/hr Intravenous Stopped 05/19/23 1139)  potassium chloride (KLOR-CON) packet 40 mEq (40 mEq Oral Not Given 05/19/23 0954)  magnesium oxide (MAG-OX) tablet 800 mg (800 mg Oral Given 05/19/23 0951)  metoprolol tartrate (LOPRESSOR) tablet 100 mg (100 mg Oral Given 05/19/23 1038)  sodium chloride 0.9 % bolus 1,000 mL (1,000 mLs Intravenous New Bag/Given 05/19/23 1142)  iohexol (OMNIPAQUE) 350 MG/ML injection 75 mL (75 mLs Intravenous Contrast Given 05/19/23 1148)    Mobility walks with person assist     Focused Assessments Cardiac Assessment Handoff:    Lab Results  Component Value Date   TROPONINI <0.03 04/15/2016   Lab Results  Component Value Date   DDIMER 1.06 (H) 09/09/2020   Does the Patient currently have chest pain? Yes    R Recommendations: See Admitting Provider Note  Report given to:   Additional Notes: A-fib RVR 180bpm. Cardizem and Metoprolol given for HR. Hypotension secondary to Metoprolol. BP's are now soft. Pt still has chest tightness. 18g RAC and 18g LAC.

## 2023-05-19 NOTE — Progress Notes (Signed)
PT Cancellation Note  Patient Details Name: [name] MRN: [id number] DOB: 20-Oct-1941   Cancelled Treatment:    Reason Eval/Treat Not Completed: PT screened, no needs identified, will sign off.  Patient ambulating with nursing staff safely.   3:44 PM, 05/19/23 Ocie Bob, MPT Physical Therapist with [facility] [phone] office [phone] mobile phone

## 2023-05-19 NOTE — ED Triage Notes (Signed)
Pt brought in by [facility] from [facility] for fast heartrate  Pt states she woke up not feeling well and staff found pt's heart rate to be in the 160's  EMS arrived and pt found to be in Afib w/RVR  Ems administered approximately 6mg  of Cardizem before pt stated she did not want any life saving measures because she wanted to be with her husband and son  Pt arrived and is alert and states "I just don't feel good"  Pt states she would like to have a DNR and paper given to Dr. Posey Rea to complete  Dr. Posey Rea spoke with pt about life saving measures and pt verbalized she did not want to be resuscitated   Dr. Posey Rea spoke with pt's niece to give her an update

## 2023-05-19 NOTE — Consult Note (Addendum)
Patient Demographics:    Idy Rawling, is a 82 y.o. female  MRN: [id number]   DOB - 1940/11/18  Admit Date - 05/19/2023  Outpatient Primary MD for the patient is Sonny Masters, FNP   Assessment & Plan:   Assessment and Plan  1)PAF--- with RVR ---heart rate running higher since she was changed from metoprolol to Coreg on 05/13/2023 -Hx of previous cardioversions, refuses additional DCCV's. Unsure when Amiodarone was stopped. Failed sotalol, reported cost issues with dofetilide.  -Patient received IV Cardizem and p.o. metoprolol in the ED -Curbside conversation by EDP with Dr. Diona Browner from general cardiology -Recommend switching back to metoprolol for better rate control and stopping Coreg -Patient previously used metoprolol tartrate 100 mg twice daily, okay to discharge on metoprolol succinate 200 mg daily -May use Cardizem as needed for heart rate greater than 120 please see discharge instructions -Previously declined full anticoagulation for stroke prophylaxis purposes due to concerns about frequent falls -Continue aspirin therapy  2)HFpEF--- echo from 04/17/2023 with EF of 55% , continue Torsemide and, give potassium supplementation  3) hypokalemia--- in the setting of diuretic use give potassium supplementation while on torsemide  4) dyspnea--in the setting of A-fib with RVR -CTA chest without acute PE, concern for volume overload status/CHF -Torsemide as above #2  5) valvular abnormalities  -No significant aortic stenosis  -TTE 04/2023 revealed mild MR, mild AR, and severe TR d/t mildly thickened TV with incomplete coaptation likely r/t annular dilatation. Not a surgical candidate   Generalized weakness--- patient is ambulatory with staff, physical therapy screen patient no physical therapy needs identified  -Plan  of care discussed with patient and patient's power of attorney and niece Mrs Ernest Mallick--- at bedside, questions answered , medication changes reviewed  Disposition--: Discharge back to ALF at [facility]--- -Discharge Instruction  1)Very low-salt diet advised-less than 2 gm per day  2)Weigh yourself daily, call if you gain more than 3 pounds in 1 day or more than 5 pounds in 1 week as your diuretic medications (Torsemide) may need to be adjusted 3)Limit your Fluid  intake to no more than 60 ounces (1.8 Liters) per day 4)Take Cardizem (Diltiazem) 30 mg up to every 12 hours as needed for Heart Rate greater than 120 bpm 5)Take Potassium supplement 10 meq daily while taking Demadex/Torsemide 6)Check BP and HR Daily x 5 days, then prn after that per facility protocols     Allergies as of 05/19/2023       Reactions   Codeine Nausea And Vomiting   Doxycycline Other (See Comments)   Chest congestion   Triamterene-hctz Other (See Comments)   weakness   Atorvastatin Other (See Comments)   Myalgias   Crestor [rosuvastatin Calcium] Other (See Comments)   weakness   Morphine Nausea Only   Risedronate Sodium Other (See Comments)   ACTONEL - reflux        Medication List     STOP taking these medications  carvedilol 12.5 MG tablet Commonly known as: COREG       TAKE these medications    acetaminophen 500 MG tablet Commonly known as: TYLENOL Take 2 tablets (1,000 mg total) by mouth every 6 (six) hours as needed for mild pain.   amLODipine 5 MG tablet Commonly known as: NORVASC Take 0.5 tablets (2.5 mg total) by mouth daily.   Aspirin Low Dose 81 MG tablet Generic drug: aspirin EC TAKE 1 TABLET TWICE DAILY What changed:  how much to take when to take this   bisacodyl 10 MG suppository Commonly known as: Dulcolax Place 1 suppository (10 mg total) rectally as needed for moderate constipation.   cholecalciferol 25 MCG (1000 UNIT) tablet Commonly known as: VITAMIN  D3 Take 1,000 Units by mouth in the morning.   Colace 100 MG capsule Generic drug: docusate sodium Take 1 capsule (100 mg total) by mouth every morning.   diltiazem 30 MG tablet Commonly known as: CARDIZEM Take 1 tablet (30 mg total) by mouth every 12 (twelve) hours as needed (For HR >>> 120 BPM).   metoprolol 200 MG 24 hr tablet Commonly known as: Toprol XL Take 1 tablet (200 mg total) by mouth every evening. Start taking on: May 20, 2023   omeprazole 20 MG capsule Commonly known as: PRILOSEC TAKE 1 CAPSULE EVERY DAY   potassium chloride 10 MEQ tablet Commonly known as: KLOR-CON Take 1 tablet (10 mEq total) by mouth daily. Take While taking Demadex/Torsemide   simvastatin 20 MG tablet Commonly known as: ZOCOR Take 1 tablet (20 mg total) by mouth daily at 6 PM.   torsemide 20 MG tablet Commonly known as: DEMADEX Take 1 tablet (20 mg total) by mouth 2 (two) times daily.   traZODone 50 MG tablet Commonly known as: DESYREL Take 1 tablet (50 mg total) by mouth at bedtime. 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 Nonobstructive minimal CAD 10/2005.   Depression    Diastolic dysfunction    Grade 1. Ejection fraction 60-65%.   Dysrhythmia    a fib   Erosive esophagitis    Essential hypertension    Fracture of ramus of right pubis with routine healing 12/09/2020   GERD (gastroesophageal reflux disease)    Grade III hemorrhoids    History of hiatal hernia    History of prediabetes    Hypercholesterolemia    Internal hemorrhoids with complication  07/29/2008   OCT 2015 FLEX SIG/IH BANDING     Intertrochanteric fracture of femur (HCC)    5/16 - 03/26/2021 fracture sustained in a mechanical fall.  IM nailing performed 5/17 by Dr Thane Edu Warfarin held because of posttraumatic pelvic hematoma.  Postop DVT with 81 mg aspirin twice daily x28 days.   Major depressive disorder, recurrent, moderate (HCC) 12/09/2020   Migraine    "used to have them right bad; I don't now" (04/11/2015)   MITRAL REGURGITATION 04/27/2010  Qualifier: Diagnosis of  By: Eden Emms, MD, Harrington Challenger    Osteoporosis    Paroxysmal atrial fibrillation [facility])    Pelvic hematoma, female 03/27/2021   Sustained in mechanical fall.  Warfarin held.  IM nailing performed 5/17 with postop DVT prophylaxis 81 mg twice daily x28 days.   PONV (postoperative nausea and vomiting)    Vertigo       Past Surgical History:  Procedure Laterality Date   BRAVO [facility] STUDY  11/15/2012   Procedure: BRAVO PH STUDY;  Surgeon: West Bali, MD;  Location: [facility];  Service: Endoscopy;;   CARDIOVERSION N/A 05/16/2020   Procedure: CARDIOVERSION;  Surgeon: Little Ishikawa, MD;  Location: [facility] ENDOSCOPY;  Service: Cardiovascular;  Laterality: N/A;   CATARACT EXTRACTION W/ INTRAOCULAR LENS  IMPLANT, BILATERAL Bilateral    COLONOSCOPY  2008   Dr. Darrick Penna: internal hemorrhoids    DILATION AND CURETTAGE OF UTERUS     ESOPHAGEAL DILATION N/A 06/16/2021   Procedure: ESOPHAGEAL DILATION;  Surgeon: Dolores Frame, MD;  Location: [facility];  Service: Gastroenterology;  Laterality: N/A;   ESOPHAGOGASTRODUODENOSCOPY (EGD) WITH ESOPHAGEAL DILATION  2001   Dr. Arlyce Dice: erosive esophagitis, esophageal stricture, duodenitis, s/p Savary dilation   ESOPHAGOGASTRODUODENOSCOPY (EGD) WITH ESOPHAGEAL DILATION  11/15/2012   [id number] web was found & MOST LIKELY CAUSE FOR DYAPHAGIA/Polyp was found in the gastric body and gastric fundus/ gastritis on bx   ESOPHAGOGASTRODUODENOSCOPY (EGD)  WITH PROPOFOL N/A 06/16/2021   Procedure: ESOPHAGOGASTRODUODENOSCOPY (EGD) WITH PROPOFOL;  Surgeon: Dolores Frame, MD;  Location: [facility];  Service: Gastroenterology;  Laterality: N/A;  8:15   EYE SURGERY Bilateral    "laser OR after cataract OR; cause I couldn't see"   FLEXIBLE SIGMOIDOSCOPY N/A 08/22/2014   mild diverticulosis in sigmoid, moderate sized Grade 3 hemorrhoids s/p banding X 3.    FRACTURE SURGERY Right    below the knee - 2 bones broke has plates   HEMORRHOID BANDING N/A 08/22/2014   Procedure: HEMORRHOID BANDING;  Surgeon: West Bali, MD;  Location: [facility];  Service: Endoscopy;  Laterality: N/A;   HEMORRHOID SURGERY N/A 03/30/2018   Procedure: EXTENSIVE HEMORRHOIDECTOMY;  Surgeon: Lucretia Roers, MD;  Location: [facility] ORS;  Service: General;  Laterality: N/A;   INTRAMEDULLARY (IM) NAIL INTERTROCHANTERIC Right 03/24/2021   Procedure: INTRAMEDULLARY (IM) NAIL INTERTROCHANTRIC;  Surgeon: Oliver Barre, MD;  Location: [facility] OR[facility];  Service: Orthopedics;  Laterality: Right;   OPEN REDUCTION INTERNAL FIXATION (ORIF) TIBIA/FIBULA FRACTURE Right 2013   broke tibia and fibula after falling down stairs   PROLAPSED UTERINE FIBROID LIGATION  2015   TOTAL ABDOMINAL HYSTERECTOMY     Chief Complaint  Patient presents with   Tachycardia      HPI:    Matie Dimaano  is a 82 y.o. female with past medical history relevant for chronic A-fib, HTN, HLD who presented to the ED by EMS from ALF facility was found to be in A-fib with RVR with heart rate up to the 160s -Patient stated that she just did not feel good she-shortness of breath and felt home well No fever  Or chills   No Nausea, Vomiting or Diarrhea -Received IV Cardizem and oral metoprolol in the ED blood pressure became soft and heart rate dropped into the 60s IV Cardizem was discontinued - Heart rate remains acceptable - Additional history obtained from patient's POA Niece Eber Jones - No frank chest pains....  She endorses dyspnea - CTA chest without PE, no  pneumonia suggest volume overload/CHF - Potassium is 3.3, creatinine 0.83 -WBC is 5.6 -EKG is A-fib -Troponins noted  -Patient ambulated with a walker independently from the bed to the bedside commode couple times and voided--- with standby assist only -Patient's power of attorney present at her bedside in the ED -Physical therapist present at her bedside in the ED   Review of systems:    In addition to the HPI above,   A full Review of  Systems was done, all other systems reviewed are negative except as noted above in HPI , .    Social History:  Reviewed by me    Social History   Tobacco Use   Smoking status: Never   Smokeless tobacco: Never  Substance Use Topics   Alcohol use: No    Alcohol/week: 0.0 standard drinks of alcohol       Family History :  Reviewed by me    Family History  Problem Relation Age of Onset   Colon cancer Mother 56   Heart disease Mother    Osteoporosis Mother    Hip fracture Mother    Stroke Sister    Diabetes Sister    Osteoporosis Sister    Arthritis Sister    Uterine cancer Sister    Stroke Sister    Heart disease Father    Hyperlipidemia Brother    Hypertension Brother    Heart disease Brother    Stroke Brother    Heart disease Sister    Dementia Sister    Diabetes Son    Stroke Son    Heart attack Neg Hx     Home Medications:   Prior to Admission medications   Medication Sig Start Date End Date Taking? Authorizing Provider  acetaminophen (TYLENOL) 500 MG tablet Take 2 tablets (1,000 mg total) by mouth every 6 (six) hours as needed for mild pain. 05/04/23  Yes Rakes, Doralee Albino, FNP  amLODipine (NORVASC) 5 MG tablet Take 0.5 tablets (2.5 mg total) by mouth daily. 04/19/23  Yes Vassie Loll, MD  ASPIRIN LOW DOSE 81 MG tablet TAKE 1 TABLET TWICE DAILY Patient taking differently: Take 81 mg by mouth in the morning and at bedtime. 06/23/22  Yes Gwenlyn Fudge, FNP  bisacodyl  (DULCOLAX) 10 MG suppository Place 1 suppository (10 mg total) rectally as needed for moderate constipation. 01/06/22  Yes Deliah Boston F, FNP  cholecalciferol (VITAMIN D3) 25 MCG (1000 UNIT) tablet Take 1,000 Units by mouth in the morning.   Yes [provider]  docusate sodium (COLACE) 100 MG capsule Take 1 capsule (100 mg total) by mouth every morning. 05/04/23  Yes Rakes, Doralee Albino, FNP  omeprazole (PRILOSEC) 20 MG capsule TAKE 1 CAPSULE EVERY DAY 02/28/23  Yes Rakes, Doralee Albino, FNP  simvastatin (ZOCOR) 20 MG tablet Take 1 tablet (20 mg total) by mouth daily at 6 PM. 04/28/23  Yes Johnson, Clanford L, MD  diltiazem (CARDIZEM) 30 MG tablet Take 1 tablet (30 mg total) by mouth every 12 (twelve) hours as needed (For HR >>> 120 BPM). 05/19/23   Shon Hale, MD  metoprolol (TOPROL XL) 200 MG 24 hr tablet Take 1 tablet (200 mg total) by mouth every evening. 05/20/23 05/19/24  Shon Hale, MD  potassium chloride (KLOR-CON) 10 MEQ tablet Take 1 tablet (10 mEq total) by mouth daily. Take While taking Demadex/Torsemide 05/19/23   Shon Hale, MD  torsemide (DEMADEX) 20 MG tablet Take 1 tablet (20 mg total) by mouth 2 (two) times daily. 05/19/23  Shon Hale, MD  traZODone (DESYREL) 50 MG tablet Take 1 tablet (50 mg total) by mouth at bedtime. 05/19/23   Shon Hale, MD     Allergies:     Allergies  Allergen Reactions   Codeine Nausea And Vomiting   Doxycycline Other (See Comments)    Chest congestion   Triamterene-Hctz Other (See Comments)    weakness   Atorvastatin Other (See Comments)    Myalgias    Crestor [Rosuvastatin Calcium] Other (See Comments)    weakness   Morphine Nausea Only   Risedronate Sodium Other (See Comments)    ACTONEL - reflux     Physical Exam:   Vitals  Blood pressure 126/88, pulse (!) 108, temperature 97.9 F (36.6 C), temperature source Oral, resp. rate (!) 29, height 4\' 8"  (1.422 m), weight 50.5 kg, SpO2 94%.  Physical Examination:  General appearance - alert, frail and chronically ill-appearing Mental status - alert, oriented to person, place, and time, Eyes - sclera anicteric Neck - supple, no JVD elevation , Chest - clear  to auscultation bilaterally, symmetrical air movement,  Heart - S1 and S2 normal, irregularly irregular Abdomen - soft, nontender, nondistended, +BS Neurological - screening mental status exam normal, neck supple without rigidity, cranial nerves II through XII intact, DTR's normal and symmetric Extremities - no pedal edema noted, intact peripheral pulses  Skin - warm, dry   Data Review:    CBC Recent Labs  Lab 05/19/23 0826  WBC 5.6  HGB 15.9*  HCT 47.2*  PLT 137*  MCV 98.7  MCH 33.3  MCHC 33.7  RDW 13.4  LYMPHSABS 1.2  MONOABS 0.5  EOSABS 0.1  BASOSABS 0.1   ------------------------------------------------------------------------------------------------------------------  Chemistries  Recent Labs  Lab 05/19/23 0826 05/19/23 0832  NA 136  --   K 3.3*  --   CL 94*  --   CO2 29  --   GLUCOSE 171*  --   BUN 17  --   CREATININE 0.83  --   CALCIUM 9.3  --   MG  --  2.2  AST 24  --   ALT 15  --   ALKPHOS 95  --   BILITOT 1.2  --    ------------------------------------------------------------------------------------------------------------------ estimated creatinine clearance is 33.5 mL/min (by C-G formula based on SCr of 0.83 mg/dL). ------------------------------------------------------------------------------------------------------------------ ------------------------------------------------------------------------------------------------------------------    Component Value Date/Time   BNP 275.8 (H) 04/20/2023 0950   BNP 546.0 (H) 09/10/2020 0442   Urinalysis    Component Value Date/Time   COLORURINE STRAW (A) 04/27/2023 1519   APPEARANCEUR CLEAR 04/27/2023 1519   APPEARANCEUR Clear 07/31/2020 1545   LABSPEC 1.006 04/27/2023 1519   PHURINE 5.0 04/27/2023 1519    GLUCOSEU NEGATIVE 04/27/2023 1519   HGBUR NEGATIVE 04/27/2023 1519   BILIRUBINUR NEGATIVE 04/27/2023 1519   BILIRUBINUR Negative 07/31/2020 1545   KETONESUR NEGATIVE 04/27/2023 1519   PROTEINUR NEGATIVE 04/27/2023 1519   UROBILINOGEN negative 02/06/2015 1252   NITRITE NEGATIVE 04/27/2023 1519   LEUKOCYTESUR NEGATIVE 04/27/2023 1519    ----------------------------------------------------------------------------------------------------------------   Imaging Results:    CT Angio Chest PE W and/or Wo Contrast  Result Date: 05/19/2023 CLINICAL DATA:  82 year old female with tachycardia, malaise. EXAM: CT ANGIOGRAPHY CHEST WITH CONTRAST TECHNIQUE: Multidetector CT imaging of the chest was performed using the standard protocol during bolus administration of intravenous contrast. Multiplanar CT image reconstructions and MIPs were obtained to evaluate the vascular anatomy. RADIATION DOSE REDUCTION: This exam was performed according to the departmental dose-optimization program which includes automated exposure  control, adjustment of the mA and/or kV according to patient size and/or use of iterative reconstruction technique. CONTRAST:  75mL OMNIPAQUE IOHEXOL 350 MG/ML SOLN COMPARISON:  Portable chest 1137 hours today. Previous chest CTA 11/03/2005. FINDINGS: Cardiovascular: Excellent contrast bolus timing in the pulmonary arterial tree. But there is substantial reflux of contrast from the right heart into dilated IVC, dilated hepatic veins. Subsequently there is good central and main pulmonary artery opacification. But rapidly tapered appearance of the bilateral pulmonary artery branches and superimposed respiratory motion artifact. No pulmonary artery filling defect is identified, but the segmental and distal branches are not well evaluated. Cardiomegaly. Reflux of contrast from the right heart into the abdomen as stated above. No pericardial effusion. Calcified aortic atherosclerosis. No contrast in the  aorta. Mediastinum/Nodes: Negative. Lungs/Pleura: Mild respiratory motion. Major airways are patent. Pulmonary hyperinflation with increased AP dimension to the chest. Only mild pulmonary septal thickening. No consolidation or ground-glass opacity. And trace if any pleural fluid. Upper Abdomen: Contrast reflux into the IVC and liver. Negative visible virtually noncontrast spleen, pancreas, adrenal glands, kidneys, and bowel in the upper abdomen. Musculoskeletal: Diffuse pronounced osteopenia. Thoracic kyphoscoliosis and superimposed T5, T11, T12, L1 (vertebra plana) and other upper lumbar spine compression fractures. The T11 and lumbar vertebrae were compressed on a lumbar MRI last year, 01/12/2022. There are associated chronic posterior rib fractures including the left 8 through 12th ribs. Chronic sternal fracture. Review of the MIP images confirms the above findings. IMPRESSION: 1. 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 No PE is identified but the segmental and distal branches are not well evaluated. 2. Cardiomegaly and contrast reflux from the right heart. But no overt pulmonary edema. No pulmonary infection. Trace if any pleural fluid. 3. Severe osteopenia and widespread thoracic and lumbar compression fractures, chronic posterior rib fractures. Electronically Signed   By: Odessa Fleming M.D.   On: 05/19/2023 12:47   DG Chest Portable 1 View  Result Date: 05/19/2023 CLINICAL DATA:  Dyspnea, tachycardia EXAM: PORTABLE CHEST 1 VIEW COMPARISON:  Chest radiograph 04/27/2023, same day CTA chest. FINDINGS: The heart is enlarged.  The upper mediastinal contours are normal. There are mildly coarsened interstitial markings in both lungs. There is no focal consolidation or pulmonary edema. There is no pleural effusion or pneumothorax There is no acute osseous  abnormality. IMPRESSION: No radiographic evidence of acute cardiopulmonary process. Electronically Signed   By: Lesia Hausen M.D.   On: 05/19/2023 12:13    Radiological Exams on Admission: CT Angio Chest PE W and/or Wo Contrast  Result Date: 05/19/2023 CLINICAL DATA:  82 year old female with tachycardia, malaise. EXAM: CT ANGIOGRAPHY CHEST WITH CONTRAST TECHNIQUE: Multidetector CT imaging of the chest was performed using the standard protocol during bolus administration of intravenous contrast. Multiplanar CT image reconstructions and MIPs were obtained to evaluate the vascular anatomy. RADIATION DOSE REDUCTION: This exam was performed according to the departmental dose-optimization program which includes automated exposure control, adjustment of the mA and/or kV according to patient size and/or use of iterative reconstruction technique. CONTRAST:  75mL OMNIPAQUE IOHEXOL 350 MG/ML SOLN COMPARISON:  Portable chest 1137 hours today. Previous chest CTA 11/03/2005. FINDINGS: Cardiovascular: Excellent contrast bolus timing in the pulmonary arterial tree. But there is substantial reflux of contrast from the right heart into dilated IVC, dilated hepatic veins. Subsequently there is good central and main pulmonary artery opacification.  But rapidly tapered appearance of the bilateral pulmonary artery branches and superimposed respiratory motion artifact. No pulmonary artery filling defect is identified, but the segmental and distal branches are not well evaluated. Cardiomegaly. Reflux of contrast from the right heart into the abdomen as stated above. No pericardial effusion. Calcified aortic atherosclerosis. No contrast in the aorta. Mediastinum/Nodes: Negative. Lungs/Pleura: Mild respiratory motion. Major airways are patent. Pulmonary hyperinflation with increased AP dimension to the chest. Only mild pulmonary septal thickening. No consolidation or ground-glass opacity. And trace if any pleural fluid. Upper Abdomen:  Contrast reflux into the IVC and liver. Negative visible virtually noncontrast spleen, pancreas, adrenal glands, kidneys, and bowel in the upper abdomen. Musculoskeletal: Diffuse pronounced osteopenia. Thoracic kyphoscoliosis and superimposed T5, T11, T12, L1 (vertebra plana) and other upper lumbar spine compression fractures. The T11 and lumbar vertebrae were compressed on a lumbar MRI last year, 01/12/2022. There are associated chronic posterior rib fractures including the left 8 through 12th ribs. Chronic sternal fracture. Review of the MIP images confirms the above findings. IMPRESSION: 1. 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 No PE is identified but the segmental and distal branches are not well evaluated. 2. Cardiomegaly and contrast reflux from the right heart. But no overt pulmonary edema. No pulmonary infection. Trace if any pleural fluid. 3. Severe osteopenia and widespread thoracic and lumbar compression fractures, chronic posterior rib fractures. Electronically Signed   By: Odessa Fleming M.D.   On: 05/19/2023 12:47   DG Chest Portable 1 View  Result Date: 05/19/2023 CLINICAL DATA:  Dyspnea, tachycardia EXAM: PORTABLE CHEST 1 VIEW COMPARISON:  Chest radiograph 04/27/2023, same day CTA chest. FINDINGS: The heart is enlarged.  The upper mediastinal contours are normal. There are mildly coarsened interstitial markings in both lungs. There is no focal consolidation or pulmonary edema. There is no pleural effusion or pneumothorax There is no acute osseous abnormality. IMPRESSION: No radiographic evidence of acute cardiopulmonary process. Electronically Signed   By: Lesia Hausen M.D.   On: 05/19/2023 12:13     Family Communication: patients condition and plan of care including tests being ordered have been discussed with the patient and her  niece/POA--Carolyn who indicate understanding and agree with the plan   Condition stable  Shon Hale M.D on 05/19/2023 at 5:11 PM Go to [email or web address] -  for contact info  [facility] - Office  [phone]

## 2023-05-19 NOTE — ED Provider Notes (Signed)
[facility] EMERGENCY DEPARTMENT AT [facility] Provider Note  CSN: [id number] Arrival date & time: 05/19/23 0820  Chief Complaint(s) No chief complaint on file.  HPI Norma Barajas is a 82 y.o. female with PMH acoustic neuroma with hearing impairment, paroxysmal A-fib not on anticoagulation secondary to frequent falls, HTN, CHF, GERD, HLD, recent hospitalization with discharge on 04/28/2023 for rapid A-fib with RVR who presents emergency department for evaluation of rapid heart rate and generalized malaise.  States that symptoms began for a short period last night but this morning were persistent with palpitations and a general feeling of malaise.  Patient states that she "just does not feel good".  Denies chest pain, abdominal pain, nausea, vomiting or other systemic symptoms.  Patient arrives with A-fib with RVR with rates between 140 and 170.  Of note, while in the ambulance, patient voiced that she would not want any heroic measures including CPR or intubation and would like to make sure that her CODE STATUS is appropriately updated as a DNR/DNI.  She would consider an ablation if this is an option and would decrease the frequency of these events.   Past Medical History Past Medical History:  Diagnosis Date   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 Nonobstructive minimal CAD 10/2005.   Depression    Diastolic dysfunction    Grade 1. Ejection fraction 60-65%.   Dysrhythmia    a fib   Erosive esophagitis    Essential hypertension    Fracture of ramus of right pubis with routine healing 12/09/2020   GERD (gastroesophageal reflux disease)    Grade III hemorrhoids    History of hiatal hernia    History of prediabetes    Hypercholesterolemia    Internal hemorrhoids with complication 07/29/2008    OCT 2015 FLEX SIG/IH BANDING     Intertrochanteric fracture of femur (HCC)    5/16 - 03/26/2021 fracture sustained in a mechanical fall.  IM nailing performed 5/17 by Dr Thane Edu Warfarin held because of posttraumatic pelvic hematoma.  Postop DVT with 81 mg aspirin twice daily x28 days.   Major depressive disorder, recurrent, moderate (HCC) 12/09/2020   Migraine    "used to have them right bad; I don't now" (04/11/2015)   MITRAL REGURGITATION 04/27/2010   Qualifier: Diagnosis of  By: Eden Emms, MD, Harrington Challenger    Osteoporosis    Paroxysmal atrial fibrillation [facility])    Pelvic hematoma, female 03/27/2021   Sustained in mechanical fall.  Warfarin held.  IM nailing performed 5/17 with postop DVT prophylaxis 81 mg twice daily x28 days.   PONV (postoperative nausea and vomiting)    Vertigo    Patient Active Problem List   Diagnosis Date Noted   Afib (HCC) 04/27/2023   Atrial fibrillation with RVR (HCC) 04/16/2023   Stage 3a chronic kidney disease (HCC) 11/03/2022   Iron deficiency 11/03/2022   Nonspecific abnormal electrocardiogram (ECG) (EKG) 08/29/2022   Abnormal SPEP 02/07/2022   Degenerative disc disease, lumbar 12/20/2021   Hypertensive heart disease with chronic diastolic congestive heart failure (HCC) 03/27/2021   Gallbladder sludge 03/27/2021   Aortic atherosclerosis (HCC) 03/27/2021   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) 10/23/2020   Severe tricuspid valve regurgitation 10/23/2020   Hypokalemia 09/16/2020  Early satiety 07/15/2020   Difficulty sleeping 06/22/2019   Paroxysmal atrial fibrillation (HCC) 08/21/2015   Lumbar pain 08/07/2015   Drug induced constipation 12/14/2013   HTN (hypertension) 07/24/2013   Coronary atherosclerosis of native coronary artery 07/24/2013   Pulmonary nodule 02/18/2011   Hyperlipidemia 07/29/2008   GAD (generalized anxiety disorder) 07/29/2008   Primary hypertension 07/29/2008   GERD  07/29/2008   Osteoporosis 07/29/2008   Home Medication(s) Prior to Admission medications   Medication Sig Start Date End Date Taking? Authorizing Provider  acetaminophen (TYLENOL) 500 MG tablet Take 2 tablets (1,000 mg total) by mouth every 6 (six) hours as needed for mild pain. 05/04/23   Sonny Masters, FNP  amLODipine (NORVASC) 5 MG tablet Take 0.5 tablets (2.5 mg total) by mouth daily. 04/19/23   Vassie Loll, MD  ASPIRIN LOW DOSE 81 MG tablet TAKE 1 TABLET TWICE DAILY Patient taking differently: Take 81 mg by mouth every morning. 06/23/22   Gwenlyn Fudge, FNP  bisacodyl (DULCOLAX) 10 MG suppository Place 1 suppository (10 mg total) rectally as needed for moderate constipation. 01/06/22   Gwenlyn Fudge, FNP  carvedilol (COREG) 12.5 MG tablet Take 1 tablet (12.5 mg total) by mouth 2 (two) times daily. 05/13/23 08/11/23  Sharlene Dory, NP  cholecalciferol (VITAMIN D3) 25 MCG (1000 UNIT) tablet Take 1,000 Units by mouth in the morning.    [provider]  docusate sodium (COLACE) 100 MG capsule Take 1 capsule (100 mg total) by mouth every morning. 05/04/23   Sonny Masters, FNP  omeprazole (PRILOSEC) 20 MG capsule TAKE 1 CAPSULE EVERY DAY 02/28/23   Sonny Masters, FNP  simvastatin (ZOCOR) 20 MG tablet Take 1 tablet (20 mg total) by mouth daily at 6 PM. 04/28/23   Johnson, Clanford L, MD  torsemide (DEMADEX) 20 MG tablet Take 1 tablet (20 mg total) by mouth 2 (two) times daily. 04/29/23   Johnson, Clanford L, MD  traZODone (DESYREL) 50 MG tablet Take 0.5-1 tablets (25-50 mg total) by mouth at bedtime as needed for sleep. 05/05/23   Sonny Masters, FNP                                                                                                                                    Past Surgical History Past Surgical History:  Procedure Laterality Date   BRAVO [facility] STUDY  11/15/2012   Procedure: BRAVO PH STUDY;  Surgeon: West Bali, MD;  Location: [facility] ENDO SUITE;  Service: Endoscopy;;    CARDIOVERSION N/A 05/16/2020   Procedure: CARDIOVERSION;  Surgeon: Little Ishikawa, MD;  Location: [facility] ENDOSCOPY;  Service: Cardiovascular;  Laterality: N/A;   CATARACT EXTRACTION W/ INTRAOCULAR LENS  IMPLANT, BILATERAL Bilateral    COLONOSCOPY  2008   Dr. Darrick Penna: internal hemorrhoids    DILATION AND CURETTAGE OF UTERUS     ESOPHAGEAL DILATION N/A 06/16/2021   Procedure: ESOPHAGEAL DILATION;  Surgeon:  Dolores Frame, MD;  Location: [facility] ENDO SUITE;  Service: Gastroenterology;  Laterality: N/A;   ESOPHAGOGASTRODUODENOSCOPY (EGD) WITH ESOPHAGEAL DILATION  2001   Dr. Arlyce Dice: erosive esophagitis, esophageal stricture, duodenitis, s/p Savary dilation   ESOPHAGOGASTRODUODENOSCOPY (EGD) WITH ESOPHAGEAL DILATION  11/15/2012   DGU:[id number] web was found & MOST LIKELY CAUSE FOR DYAPHAGIA/Polyp was found in the gastric body and gastric fundus/ gastritis on bx   ESOPHAGOGASTRODUODENOSCOPY (EGD) WITH PROPOFOL N/A 06/16/2021   Procedure: ESOPHAGOGASTRODUODENOSCOPY (EGD) WITH PROPOFOL;  Surgeon: Dolores Frame, MD;  Location: [facility] ENDO SUITE;  Service: Gastroenterology;  Laterality: N/A;  8:15   EYE SURGERY Bilateral    "laser OR after cataract OR; cause I couldn't see"   FLEXIBLE SIGMOIDOSCOPY N/A 08/22/2014   mild diverticulosis in sigmoid, moderate sized Grade 3 hemorrhoids s/p banding X 3.    FRACTURE SURGERY Right    below the knee - 2 bones broke has plates   HEMORRHOID BANDING N/A 08/22/2014   Procedure: HEMORRHOID BANDING;  Surgeon: West Bali, MD;  Location: [facility] END[facility];  Service: Endoscopy;  Laterality: N/A;   HEMORRHOID SURGERY N/A 03/30/2018   Procedure: EXTENSIVE HEMORRHOIDECTOMY;  Surgeon: Lucretia Roers, MD;  Location: [facility] ORS;  Service: General;  Laterality: N/A;   INTRAMEDULLARY (IM) NAIL INTERTROCHANTERIC Right 03/24/2021   Procedure: INTRAMEDULLARY (IM) NAIL INTERTROCHANTRIC;  Surgeon: Oliver Barre, MD;  Location: [facility] ORS;  Service: Orthopedics;  Laterality:  Right;   OPEN REDUCTION INTERNAL FIXATION (ORIF) TIBIA/FIBULA FRACTURE Right 2013   broke tibia and fibula after falling down stairs   PROLAPSED UTERINE FIBROID LIGATION  2015   TOTAL ABDOMINAL HYSTERECTOMY     Family History Family History  Problem Relation Age of Onset   Colon cancer Mother 29   Heart disease Mother    Osteoporosis Mother    Hip fracture Mother    Stroke Sister    Diabetes Sister    Osteoporosis Sister    Arthritis Sister    Uterine cancer Sister    Stroke Sister    Heart disease Father    Hyperlipidemia Brother    Hypertension Brother    Heart disease Brother    Stroke Brother    Heart disease Sister    Dementia Sister    Diabetes Son    Stroke Son    Heart attack Neg Hx     Social History Social History   Tobacco Use   Smoking status: Never   Smokeless tobacco: Never  Vaping Use   Vaping status: Never Used  Substance Use Topics   Alcohol use: No    Alcohol/week: 0.0 standard drinks of alcohol   Drug use: No   Allergies Codeine, Doxycycline, Triamterene-hctz, Atorvastatin, Crestor [rosuvastatin calcium], Morphine, and Risedronate sodium  Review of Systems Review of Systems  Constitutional:  Positive for fatigue.  Cardiovascular:  Positive for palpitations.    Physical Exam Vital Signs  I have reviewed the triage vital signs SpO2 96%   Physical Exam Vitals and nursing note reviewed.  Constitutional:      General: She is not in acute distress.    Appearance: She is well-developed.  HENT:     Head: Normocephalic and atraumatic.  Eyes:     Conjunctiva/sclera: Conjunctivae normal.  Cardiovascular:     Rate and Rhythm: Tachycardia present. Rhythm irregular.     Heart sounds: No murmur heard. Pulmonary:     Effort: Pulmonary effort is normal. No respiratory distress.     Breath sounds: Normal breath sounds.  Abdominal:     Palpations: Abdomen is soft.     Tenderness: There is no abdominal tenderness.  Musculoskeletal:         General: No swelling.     Cervical back: Neck supple.  Skin:    General: Skin is warm and dry.     Capillary Refill: Capillary refill takes less than 2 seconds.  Neurological:     Mental Status: She is alert.  Psychiatric:        Mood and Affect: Mood normal.     ED Results and Treatments Labs (all labs ordered are listed, but only abnormal results are displayed) Labs Reviewed  COMPREHENSIVE METABOLIC PANEL  CBC WITH DIFFERENTIAL/PLATELET  TROPONIN I (HIGH SENSITIVITY)                                                                                                                          Radiology No results found.  Pertinent labs & imaging results that were available during my care of the patient were reviewed by me and considered in my medical decision making (see MDM for details).  Medications Ordered in ED Medications  diltiazem (CARDIZEM) 1 mg/mL load via infusion 10 mg (10 mg Intravenous Bolus from Bag 05/19/23 0833)    And  diltiazem (CARDIZEM) 125 mg in dextrose 5% 125 mL (1 mg/mL) infusion (5 mg/hr Intravenous New Bag/Given 05/19/23 0833)                                                                                                                                     Procedures .Critical Care  Performed by: Glendora Score, MD Authorized by: Glendora Score, MD   Critical care provider statement:    Critical care time (minutes):  30   Critical care was necessary to treat or prevent imminent or life-threatening deterioration of the following conditions:  Cardiac failure and circulatory failure   Critical care was time spent personally by me on the following activities:  Development of treatment plan with patient or surrogate, discussions with consultants, evaluation of patient's response to treatment, examination of patient, ordering and review of laboratory studies, ordering and review of radiographic studies, ordering and performing treatments and interventions,  pulse oximetry, re-evaluation of patient's condition and review of old charts   (including critical care time)  Medical Decision Making / ED Course   This patient presents to the ED for concern  of palpitations, this involves an extensive number of treatment options, and is a complaint that carries with it a high risk of complications and morbidity.  The differential diagnosis includes electrolyte abnormality, failure of outpatient medication regimen, dehydration, persistent A-fib, PE, pneumonia, pneumothorax, ACS  MDM: Patient seen emergency room for evaluation of rapid heart rate and generalized malaise.  Physical exam reveals an ill-appearing patient with very irregular tachycardia with rates between 150 and 170.  Initial laboratory evaluation with mild hypokalemia to 3.3 but is otherwise unremarkable.  Potassium and magnesium replaced here in the emergency department.  Patient started on diltiazem bolus and infusion and on reevaluation, patient having significant improvement, heart rate standpoint her rates have dropped from the 160s down to between 90 and 100.  I spoke with Dr. Diona Browner from cardiology and we are both in agreement that patient's presentation is likely secondary to patient being transition from 100 of Lopressor twice daily to a very small dose of Coreg 12.5 in the outpatient setting likely triggering patient to go back and RVR.  Given significant improvement of rates on Cardizem drip, we attempted to restart the patient's Lopressor oral medication and wean her off of the drip.  Patient did have a dramatic improvement of her heart rate and we were able to wean off of the Cardizem drip, but patient had an episode of sudden onset shortness of breath with associated hypotension and I did expand patient's workup including the chest x-ray and CT PE which were both reassuringly negative for acute pathology.  Patient fluid resuscitated and symptoms starting to improve.  I spoke again with Dr.  Diona Browner from cardiology states that this is likely polypharmacy with a vagal response from calcium channel blocker and beta-blocker mixing.  I did attempt to admit the patient for observation to ensure that the patient was able to tolerate her oral metoprolol.  Please see hospitalist note for disposition decision making as patient was quickly discharged from the emergency room after hospitalist evaluation.  Of note, given patient's CODE STATUS discussion is listed in the HPI, we filled out the appropriate paperwork to confirm that patient is DNR/DNI.   Additional history obtained: -Additional history obtained from niece -External records from outside source obtained and reviewed including: Chart review including previous notes, labs, imaging, consultation notes   Lab Tests: -I ordered, reviewed, and interpreted labs.   The pertinent results include:   Labs Reviewed  COMPREHENSIVE METABOLIC PANEL  CBC WITH DIFFERENTIAL/PLATELET  TROPONIN I (HIGH SENSITIVITY)      EKG  A-fib with RVR    Imaging Studies ordered: I ordered imaging studies including chest x-ray, CT PE I independently visualized and interpreted imaging. I agree with the radiologist interpretation   Medicines ordered and prescription drug management: Meds ordered this encounter  Medications   AND Linked Order Group    diltiazem (CARDIZEM) 1 mg/mL load via infusion 10 mg    diltiazem (CARDIZEM) 125 mg in dextrose 5% 125 mL (1 mg/mL) infusion    -I have reviewed the patients home medicines and have made adjustments as needed  Critical interventions Cardizem, fluid resuscitation  Consultations Obtained: I requested consultation with the cardiologist Dr. Diona Browner,  and discussed lab and imaging findings as well as pertinent plan - they recommend: Transition to oral Lopressor with outpatient cardiology follow-up   Cardiac Monitoring: The patient was maintained on a cardiac monitor.  I personally viewed and  interpreted the cardiac monitored which showed an underlying rhythm of: A-fib with RVR, rate controlled  A-fib  Social Determinants of Health:  Factors impacting patients care include: Lives in independent living   Reevaluation: After the interventions noted above, I reevaluated the patient and found that they have :improved  Co morbidities that complicate the patient evaluation  Past Medical History:  Diagnosis Date   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 Nonobstructive minimal CAD 10/2005.   Depression    Diastolic dysfunction    Grade 1. Ejection fraction 60-65%.   Dysrhythmia    a fib   Erosive esophagitis    Essential hypertension    Fracture of ramus of right pubis with routine healing 12/09/2020   GERD (gastroesophageal reflux disease)    Grade III hemorrhoids    History of hiatal hernia    History of prediabetes    Hypercholesterolemia    Internal hemorrhoids with complication 07/29/2008   OCT 2015 FLEX SIG/IH BANDING     Intertrochanteric fracture of femur (HCC)    5/16 - 03/26/2021 fracture sustained in a mechanical fall.  IM nailing performed 5/17 by Dr Thane Edu Warfarin held because of posttraumatic pelvic hematoma.  Postop DVT with 81 mg aspirin twice daily x28 days.   Major depressive disorder, recurrent, moderate (HCC) 12/09/2020   Migraine    "used to have them right bad; I don't now" (04/11/2015)   MITRAL REGURGITATION 04/27/2010   Qualifier: Diagnosis of  By: Eden Emms, MD, Harrington Challenger    Osteoporosis    Paroxysmal atrial fibrillation [facility])    Pelvic hematoma, female 03/27/2021   Sustained in mechanical fall.  Warfarin held.  IM nailing performed 5/17 with postop DVT prophylaxis 81 mg twice daily x28 days.   PONV (postoperative nausea and vomiting)    Vertigo        Dispostion: I considered admission for this patient, and due to concern for polypharmacy in the setting of A-fib with RVR patient was admitted     Final Clinical Impression(s) / ED Diagnoses Final diagnoses:  None     @PCDICTATION @    Glendora Score, MD 05/19/23 2131

## 2023-05-19 NOTE — Discharge Instructions (Addendum)
1)Very low-salt diet advised-less than 2 gm per day  2)Weigh yourself daily, call if you gain more than 3 pounds in 1 day or more than 5 pounds in 1 week as your diuretic medications (Torsemide) may need to be adjusted 3)Limit your Fluid  intake to no more than 60 ounces (1.8 Liters) per day 4)Take Cardizem (Diltiazem) 30 mg up to every 12 hours as needed for Heart Rate greater than 120 bpm 5)Take Potassium supplement 10 meq daily while taking Demadex/Torsemide 6)Check BP and HR Daily x 5 days, then prn after that per facility protocols

## 2023-05-19 NOTE — ED Notes (Signed)
Pt reports significant feeling of weakness and shortness of breath. Chest tightness remains but has not worsened. Pt does not feel like she is able to ambulate, even with assistance.

## 2023-05-23 DIAGNOSIS — Z9181 History of falling: Secondary | ICD-10-CM | POA: Diagnosis not present

## 2023-05-23 DIAGNOSIS — I4891 Unspecified atrial fibrillation: Secondary | ICD-10-CM | POA: Diagnosis not present

## 2023-05-23 DIAGNOSIS — R279 Unspecified lack of coordination: Secondary | ICD-10-CM | POA: Diagnosis not present

## 2023-05-23 DIAGNOSIS — I10 Essential (primary) hypertension: Secondary | ICD-10-CM | POA: Diagnosis not present

## 2023-05-23 DIAGNOSIS — M6281 Muscle weakness (generalized): Secondary | ICD-10-CM | POA: Diagnosis not present

## 2023-05-24 DIAGNOSIS — I499 Cardiac arrhythmia, unspecified: Secondary | ICD-10-CM | POA: Diagnosis not present

## 2023-05-24 DIAGNOSIS — I10 Essential (primary) hypertension: Secondary | ICD-10-CM | POA: Diagnosis not present

## 2023-05-24 DIAGNOSIS — M6281 Muscle weakness (generalized): Secondary | ICD-10-CM | POA: Diagnosis not present

## 2023-05-24 DIAGNOSIS — R278 Other lack of coordination: Secondary | ICD-10-CM | POA: Diagnosis not present

## 2023-05-25 ENCOUNTER — Other Ambulatory Visit: Payer: Self-pay | Admitting: Family Medicine

## 2023-05-25 DIAGNOSIS — I10 Essential (primary) hypertension: Secondary | ICD-10-CM | POA: Diagnosis not present

## 2023-05-25 DIAGNOSIS — I4891 Unspecified atrial fibrillation: Secondary | ICD-10-CM | POA: Diagnosis not present

## 2023-05-25 DIAGNOSIS — Z9181 History of falling: Secondary | ICD-10-CM | POA: Diagnosis not present

## 2023-05-25 DIAGNOSIS — M6281 Muscle weakness (generalized): Secondary | ICD-10-CM | POA: Diagnosis not present

## 2023-05-25 DIAGNOSIS — R279 Unspecified lack of coordination: Secondary | ICD-10-CM | POA: Diagnosis not present

## 2023-05-25 MED ORDER — NYSTATIN 100000 UNIT/GM EX POWD: 1.0000 | Freq: Three times a day (TID) | CUTANEOUS | 0 refills | Status: DC

## 2023-05-25 NOTE — Telephone Encounter (Signed)
 Order pended please review and sign if correct

## 2023-05-25 NOTE — Telephone Encounter (Signed)
Patient has a yeast infection under breast (left) and they are wanting to know if nystatin order can be sent over for them to start her on. She has appt on 7/19 for hospital follow up. Does she need to come in for yeast before then? Please call back and advise.

## 2023-05-25 NOTE — Telephone Encounter (Signed)
Ok to send Nystatin powder to pharmacy.

## 2023-05-26 ENCOUNTER — Other Ambulatory Visit (HOSPITAL_COMMUNITY): Payer: Self-pay

## 2023-05-26 ENCOUNTER — Inpatient Hospital Stay (HOSPITAL_COMMUNITY): Admission: RE | Admit: 2023-05-26 | Payer: Medicare HMO | Source: Ambulatory Visit

## 2023-05-26 DIAGNOSIS — R278 Other lack of coordination: Secondary | ICD-10-CM | POA: Diagnosis not present

## 2023-05-26 DIAGNOSIS — M6281 Muscle weakness (generalized): Secondary | ICD-10-CM | POA: Diagnosis not present

## 2023-05-26 DIAGNOSIS — I10 Essential (primary) hypertension: Secondary | ICD-10-CM | POA: Diagnosis not present

## 2023-05-26 DIAGNOSIS — I499 Cardiac arrhythmia, unspecified: Secondary | ICD-10-CM | POA: Diagnosis not present

## 2023-05-27 ENCOUNTER — Encounter: Payer: Self-pay | Admitting: Family Medicine

## 2023-05-27 ENCOUNTER — Ambulatory Visit (INDEPENDENT_AMBULATORY_CARE_PROVIDER_SITE_OTHER): Payer: Medicare HMO | Admitting: Family Medicine

## 2023-05-27 VITALS — BP 123/84 | HR 87 | Temp 97.0°F | Ht <= 58 in | Wt 108.2 lb

## 2023-05-27 DIAGNOSIS — E876 Hypokalemia: Secondary | ICD-10-CM

## 2023-05-27 DIAGNOSIS — I10 Essential (primary) hypertension: Secondary | ICD-10-CM | POA: Diagnosis not present

## 2023-05-27 DIAGNOSIS — I4891 Unspecified atrial fibrillation: Secondary | ICD-10-CM

## 2023-05-27 LAB — CBC WITH DIFFERENTIAL/PLATELET
Eos: 2 %
Hematocrit: 42.8 % (ref 34.0–46.6)
Immature Grans (Abs): 0 10*3/uL (ref 0.0–0.1)
MCHC: 32.7 g/dL (ref 31.5–35.7)
Monocytes Absolute: 0.8 10*3/uL (ref 0.1–0.9)
Monocytes: 10 %
RBC: 4.34 x10E6/uL (ref 3.77–5.28)
WBC: 7.3 10*3/uL (ref 3.4–10.8)

## 2023-05-27 LAB — BMP8+EGFR
BUN/Creatinine Ratio: 18 (ref 12–28)
BUN: 22 mg/dL (ref 8–27)
CO2: 30 mmol/L — ABNORMAL HIGH (ref 20–29)
Chloride: 96 mmol/L (ref 96–106)
Creatinine, Ser: 1.2 mg/dL — ABNORMAL HIGH (ref 0.57–1.00)
Potassium: 4 mmol/L (ref 3.5–5.2)

## 2023-05-27 LAB — MAGNESIUM

## 2023-05-27 NOTE — Patient Instructions (Signed)
[facility] [address] [place], [place] [address] [phone]

## 2023-05-27 NOTE — Progress Notes (Unsigned)
Acute Office Visit  Subjective:     Patient ID: Norma Barajas, female    DOB: 11/02/1941, 82 y.o.   MRN: [id number]  Chief Complaint  Patient presents with   Follow-up    Went to ER for afib and high blood pressure.     HPI Patient is in today for ER follow up. Here with caregiver today. Per ER note:   Patient seen emergency room for evaluation of rapid heart rate and generalized malaise. Physical exam reveals an ill-appearing patient with very irregular tachycardia with rates between 150 and 170. Initial laboratory evaluation with mild hypokalemia to 3.3 but is otherwise unremarkable. Potassium and magnesium replaced here in the emergency department. Patient started on diltiazem bolus and infusion and on reevaluation, patient having significant improvement, heart rate standpoint her rates have dropped from the 160s down to between 90 and 100. I spoke with Dr. Diona Browner from cardiology and we are both in agreement that patient's presentation is likely secondary to patient being transition from 100 of Lopressor twice daily to a very small dose of Coreg 12.5 in the outpatient setting likely triggering patient to go back and RVR. Given significant improvement of rates on Cardizem drip, we attempted to restart the patient's Lopressor oral medication and wean her off of the drip. Patient did have a dramatic improvement of her heart rate and we were able to wean off of the Cardizem drip   She has an appt with cardiology next week.   Feeling better. Denies chest pain, shortness of breath, palpitations, edema, orthopnea. Her heart rate has not been elevated enough to need cardizem PO since discharge.    ROS As per HPI.     Objective:    BP 123/84   Pulse 87   Temp (!) 97 F (36.1 C) (Temporal)   Ht 4\' 8"  (1.422 m)   Wt 108 lb 3.2 oz (49.1 kg)   SpO2 95%   BMI 24.26 kg/m    Physical Exam Vitals and nursing note reviewed.  Constitutional:      General: She is not in acute distress.     Appearance: She is not ill-appearing, toxic-appearing or diaphoretic.  Cardiovascular:     Rate and Rhythm: Normal rate. Rhythm irregularly irregular.     Heart sounds: Murmur heard.     Systolic murmur is present with a grade of 3/6.  Pulmonary:     Effort: Pulmonary effort is normal. No respiratory distress.     Breath sounds: Normal breath sounds. No wheezing or rales.  Musculoskeletal:     Cervical back: Neck supple. No rigidity.     Right lower leg: No edema.     Left lower leg: No edema.  Neurological:     Mental Status: She is alert and oriented to person, place, and time. Mental status is at baseline.     Motor: Weakness (generalized) present.     Gait: Gait abnormal (arrives with walker).  Psychiatric:        Mood and Affect: Mood normal.        Behavior: Behavior normal.     No results found for any visits on 05/27/23.      Assessment & Plan:   Norma Barajas was seen today for follow-up.  Diagnoses and all orders for this visit:  Atrial fibrillation with RVR (HCC) Controlled rate. Denies symptoms. Only on aspirin due to fall risk. Keep follow up appt with cardiology.  -     CBC with Differential/Platelet -  BMP8+EGFR  Primary hypertension Well controlled on current regimen.   Hypokalemia Labs pending. Replaced in ER.  -     CBC with Differential/Platelet -     BMP8+EGFR  Hypomagnesemia Replaced in ER. Recheck today.  -     Magnesium    Return if symptoms worsen or fail to improve. Keep scheduled follow ups.   The patient indicates understanding of these issues and agrees with the plan.   Gabriel Earing, FNP

## 2023-05-28 LAB — CBC WITH DIFFERENTIAL/PLATELET
Basophils Absolute: 0.1 10*3/uL (ref 0.0–0.2)
Basos: 1 %
EOS (ABSOLUTE): 0.1 10*3/uL (ref 0.0–0.4)
Hemoglobin: 14 g/dL (ref 11.1–15.9)
Immature Granulocytes: 0 %
Lymphocytes Absolute: 1 10*3/uL (ref 0.7–3.1)
Lymphs: 14 %
MCH: 32.3 pg (ref 26.6–33.0)
MCV: 99 fL — ABNORMAL HIGH (ref 79–97)
Neutrophils Absolute: 5.3 10*3/uL (ref 1.4–7.0)
Neutrophils: 73 %
Platelets: 171 10*3/uL (ref 150–450)
RDW: 12.4 % (ref 11.7–15.4)

## 2023-05-28 LAB — BMP8+EGFR
Calcium: 9 mg/dL (ref 8.7–10.3)
Glucose: 82 mg/dL (ref 70–99)
Sodium: 141 mmol/L (ref 134–144)
eGFR: 45 mL/min/{1.73_m2} — ABNORMAL LOW (ref >59)

## 2023-05-31 DIAGNOSIS — I10 Essential (primary) hypertension: Secondary | ICD-10-CM | POA: Diagnosis not present

## 2023-05-31 DIAGNOSIS — M6281 Muscle weakness (generalized): Secondary | ICD-10-CM | POA: Diagnosis not present

## 2023-05-31 DIAGNOSIS — R278 Other lack of coordination: Secondary | ICD-10-CM | POA: Diagnosis not present

## 2023-05-31 DIAGNOSIS — I499 Cardiac arrhythmia, unspecified: Secondary | ICD-10-CM | POA: Diagnosis not present

## 2023-06-01 DIAGNOSIS — R279 Unspecified lack of coordination: Secondary | ICD-10-CM | POA: Diagnosis not present

## 2023-06-01 DIAGNOSIS — M6281 Muscle weakness (generalized): Secondary | ICD-10-CM | POA: Diagnosis not present

## 2023-06-01 DIAGNOSIS — Z9181 History of falling: Secondary | ICD-10-CM | POA: Diagnosis not present

## 2023-06-01 DIAGNOSIS — I4891 Unspecified atrial fibrillation: Secondary | ICD-10-CM | POA: Diagnosis not present

## 2023-06-01 DIAGNOSIS — I10 Essential (primary) hypertension: Secondary | ICD-10-CM | POA: Diagnosis not present

## 2023-06-02 ENCOUNTER — Encounter: Payer: Self-pay | Admitting: Nurse Practitioner

## 2023-06-02 ENCOUNTER — Ambulatory Visit: Payer: Medicare HMO | Attending: Nurse Practitioner | Admitting: Nurse Practitioner

## 2023-06-02 VITALS — BP 128/84 | HR 81 | Ht <= 58 in | Wt 109.0 lb

## 2023-06-02 DIAGNOSIS — I2510 Atherosclerotic heart disease of native coronary artery without angina pectoris: Secondary | ICD-10-CM | POA: Diagnosis not present

## 2023-06-02 DIAGNOSIS — E782 Mixed hyperlipidemia: Secondary | ICD-10-CM | POA: Diagnosis not present

## 2023-06-02 DIAGNOSIS — R278 Other lack of coordination: Secondary | ICD-10-CM | POA: Diagnosis not present

## 2023-06-02 DIAGNOSIS — E781 Pure hyperglyceridemia: Secondary | ICD-10-CM | POA: Diagnosis not present

## 2023-06-02 DIAGNOSIS — R0989 Other specified symptoms and signs involving the circulatory and respiratory systems: Secondary | ICD-10-CM

## 2023-06-02 DIAGNOSIS — R0609 Other forms of dyspnea: Secondary | ICD-10-CM

## 2023-06-02 DIAGNOSIS — I519 Heart disease, unspecified: Secondary | ICD-10-CM

## 2023-06-02 DIAGNOSIS — I10 Essential (primary) hypertension: Secondary | ICD-10-CM | POA: Diagnosis not present

## 2023-06-02 DIAGNOSIS — I482 Chronic atrial fibrillation, unspecified: Secondary | ICD-10-CM | POA: Diagnosis not present

## 2023-06-02 DIAGNOSIS — I38 Endocarditis, valve unspecified: Secondary | ICD-10-CM

## 2023-06-02 DIAGNOSIS — M6281 Muscle weakness (generalized): Secondary | ICD-10-CM | POA: Diagnosis not present

## 2023-06-02 DIAGNOSIS — I499 Cardiac arrhythmia, unspecified: Secondary | ICD-10-CM | POA: Diagnosis not present

## 2023-06-02 DIAGNOSIS — Z8679 Personal history of other diseases of the circulatory system: Secondary | ICD-10-CM | POA: Diagnosis not present

## 2023-06-02 DIAGNOSIS — R7989 Other specified abnormal findings of blood chemistry: Secondary | ICD-10-CM

## 2023-06-02 NOTE — Patient Instructions (Addendum)
Medication Instructions:  Your physician recommends that you continue on your current medications as directed. Please refer to the Current Medication list given to you today.   Labwork: None  Testing/Procedures: None  Follow-Up: Your physician recommends that you schedule a follow-up appointment in: Keep current follow up in Dec  Any Other Special Instructions Will Be Listed Below (If Applicable).  If you need a refill on your cardiac medications before your next appointment, please call your pharmacy.

## 2023-06-02 NOTE — Progress Notes (Signed)
Cardiology Office Note:  .   Date:  06/02/2023 ID:  [name], Norma Barajas 1941-03-16, MRN [id number] PCP: Norma Masters, FNP  [facility] Providers Cardiologist:  Rollene Rotunda, MD    History of Present Illness: .   Norma Barajas is a 82 y.o. female with a PMH of chronic A-fib, HTN, coronary artery atherosclerosis, mixed HLD, pulmonary HTN, and MR, who presents today for hospital follow-up.   Hospitalized 04/2023 for A-fib with RVR. Found to be hypotensive, given IV fluids. Metoprolol increased to 100 mg BID. D/C to [facility].   I last saw her for hospital follow-up on May 13, 2023. Endorsed sense of palpitations with exertion while on Metoprolol, particularly noticed when walking to the dining hall at ALF, noticed DOE along with this. Denied any chest pain, syncope, presyncope, dizziness, orthopnea, PND, swelling or significant weight changes, acute bleeding, or claudication. Previously reports in chart reveal she has done well on Coreg in the past. Metoprolol was d/c and switched to to Coreg 12.5 mg BID.   ED visit on 05/19/2023 for A-fib with RVR, medications adjusted and switched back to Metoprolol 200 mg daily. Chest X-ray and CT PE were negative for anything acute. Was d/c back to ALF.   Today she presents for follow-up. Doing well, however continues to note sense of palpitations prior to Metoprolol dosing, however I suggested d/t her history of medication intolerances with her A-fib, to continue dose of Metoprolol at this time. Niece states she has not had to take any additional PRN diltiazem. Patient continues to admit to stable DOE. Denies any chest pain, worsening shortness of breath, palpitations, syncope, presyncope, dizziness, orthopnea, PND, swelling or significant weight changes, acute bleeding, or claudication.  Studies Reviewed: .    Echo 04/2023: 1. Left ventricular ejection fraction, by estimation, is approximately  55%. The left ventricle has normal function. The left  ventricle has no  regional wall motion abnormalities. There is mild asymmetric left  ventricular hypertrophy of the basal segment.   Left ventricular diastolic parameters are indeterminate. There is the  interventricular septum is flattened in diastole ('D' shaped left  ventricle), consistent with right ventricular volume overload.   2. Right ventricular systolic function is mildly reduced. The right  ventricular size is moderately enlarged. There is normal pulmonary artery  systolic pressure. The estimated right ventricular systolic pressure is  30.1 mmHg.   3. Left atrial size was severely dilated.   4. Right atrial size was severely dilated.   5. The mitral valve is grossly normal. Mild mitral valve regurgitation.   6. Tricuspid valve mildly thickened with incomplete coaptation likely  related to annular dilatation. The tricuspid valve is abnormal. Tricuspid  valve regurgitation is severe.   7. The aortic valve is tricuspid. Aortic valve regurgitation is mild.  Aortic valve sclerosis/calcification is present, without any evidence of  aortic stenosis.   8. The inferior vena cava is dilated in size with >50% respiratory  variability, suggesting right atrial pressure of 8 mmHg.   Comparison(s): No significant change from prior study. Prior images  reviewed side by side.  Lexiscan 04/2015: The left ventricular ejection fraction is hyperdynamic (>65%). There was no ST segment deviation noted during stress. The study is normal. This is a low risk study.   Risk Assessment/Calculations:    CHA2DS2-VASc Score = 4  } This indicates a 4.8% annual risk of stroke. The patient's score is based upon: CHF History: 0 HTN History: 1 Diabetes History: 0  Stroke History: 0 Vascular Disease History: 0 Age Score: 2 Gender Score: 1       Physical Exam:   VS:  BP 128/84   Pulse 81   Ht 4\' 8"  (1.422 m)   Wt 109 lb (49.4 kg)   SpO2 97%   BMI 24.44 kg/m    Wt Readings from Last 3  Encounters:  06/02/23 109 lb (49.4 kg)  05/27/23 108 lb 3.2 oz (49.1 kg)  05/19/23 111 lb 5.3 oz (50.5 kg)    GEN: Thin, frail 82 y.o. female in no acute distress NECK: No JVD; No carotid bruits CARDIAC: S1/S2, irregular rhythm and regular rate, no murmurs, rubs, gallops RESPIRATORY:  Clear to auscultation without rales, wheezing or rhonchi, fine/diminished crackles along right posterior lung base ABDOMEN: Soft, non-tender, non-distended EXTREMITIES:  No edema; No deformity   ASSESSMENT AND PLAN: .    Chronic/Permanent A-fib Hx of previous cardioversions, refuses additional DCCV's. Unsure when Amiodarone was stopped. Failed sotalol, reported cost issues with dofetilide. Does endorse palpitations with exertion while on current dose of Metoprolol, could not tolerate Coreg. Will continue current dose of Metoprolol as she states she is able to currently tolerate this. Currently not on OAC d/t high fall risk. Heart healthy diet encouraged.   Valvular insufficiency TTE  04/2023 revealed mild MR, mild AR, and severe TR d/t mildly thickened TV with incomplete coaptation likely r/t annular dilatation. Not a surgical candidate. No medication changes at this time. Heart healthy diet encouraged.   HTN BP at goal. Discussed to monitor BP at home at least 2 hours after medications and sitting for 5-10 minutes. No medication changes. Discussed obtaining BMET at OV, however pt defers this to PCP. Heart healthy diet encouraged.   Coronary atherosclerosis Stable with no anginal symptoms. No indication for ischemic evaluation. Continue Aspirin, simvastatin, and Metoprolol. Heart healthy diet encouraged.   Mixed HLD, hypertriglyceridemia LDL 88, TG 187 01/2023. Continue simvastatin. Heart healthy diet encouraged. Continue to follow with PCP.  Hx of pulmonary HTN, RV dysfunction, DOE Echo 04/2023 revealed normal PASP, with mildly reduced RVSF with evidence of RV volume overload. Appears this has been chronic  over several years and has been reported to be stable. Euvolemic and well compensated on exam, does admit to DOE with extreme exertion, chronic and stable. Continue Torsemide and current medication regimen. Care and ED precautions discussed.   7. Elevated serum creatinine Most recent sCr was 1.20 with eGFR at 45. Discussed adequate fluid intake. Discussed repeating BMET, however pt defers this to PCP. Avoid nephrotoxic agents. Continue to follow with PCP.  8. Lung crackles Fine, diminished lung crackles noted to right posterior lower lobe. Pt politely declines imaging at this time and defers to PCP as she states she has an upcoming appt early August. Care and ED precautions discussed.  Dispo: Follow-up with Dr. Antoine Poche or APP as scheduled or sooner if anything changes.   Signed, Sharlene Dory, NP

## 2023-06-06 ENCOUNTER — Telehealth: Payer: Self-pay

## 2023-06-06 ENCOUNTER — Other Ambulatory Visit (HOSPITAL_COMMUNITY): Payer: Self-pay

## 2023-06-06 DIAGNOSIS — Z9181 History of falling: Secondary | ICD-10-CM | POA: Diagnosis not present

## 2023-06-06 DIAGNOSIS — I4891 Unspecified atrial fibrillation: Secondary | ICD-10-CM | POA: Diagnosis not present

## 2023-06-06 DIAGNOSIS — M6281 Muscle weakness (generalized): Secondary | ICD-10-CM | POA: Diagnosis not present

## 2023-06-06 DIAGNOSIS — R279 Unspecified lack of coordination: Secondary | ICD-10-CM | POA: Diagnosis not present

## 2023-06-06 DIAGNOSIS — I10 Essential (primary) hypertension: Secondary | ICD-10-CM | POA: Diagnosis not present

## 2023-06-06 NOTE — Telephone Encounter (Signed)
Pharmacy Patient Advocate Encounter  Received notification from [facility] that Prior Authorization for Nyamyc powder has been APPROVED from 11/08/22 to 11/08/23. Ran test claim, Copay is $refill too soon.  PA #/Case ID/Reference #: [id number]  Approval letter indexed to media tab

## 2023-06-07 DIAGNOSIS — M6281 Muscle weakness (generalized): Secondary | ICD-10-CM | POA: Diagnosis not present

## 2023-06-07 DIAGNOSIS — R278 Other lack of coordination: Secondary | ICD-10-CM | POA: Diagnosis not present

## 2023-06-07 DIAGNOSIS — I499 Cardiac arrhythmia, unspecified: Secondary | ICD-10-CM | POA: Diagnosis not present

## 2023-06-07 DIAGNOSIS — I10 Essential (primary) hypertension: Secondary | ICD-10-CM | POA: Diagnosis not present

## 2023-06-08 DIAGNOSIS — M6281 Muscle weakness (generalized): Secondary | ICD-10-CM | POA: Diagnosis not present

## 2023-06-08 DIAGNOSIS — R279 Unspecified lack of coordination: Secondary | ICD-10-CM | POA: Diagnosis not present

## 2023-06-08 DIAGNOSIS — Z9181 History of falling: Secondary | ICD-10-CM | POA: Diagnosis not present

## 2023-06-08 DIAGNOSIS — I4891 Unspecified atrial fibrillation: Secondary | ICD-10-CM | POA: Diagnosis not present

## 2023-06-08 DIAGNOSIS — I10 Essential (primary) hypertension: Secondary | ICD-10-CM | POA: Diagnosis not present

## 2023-06-15 DIAGNOSIS — Z9181 History of falling: Secondary | ICD-10-CM | POA: Diagnosis not present

## 2023-06-15 DIAGNOSIS — M6281 Muscle weakness (generalized): Secondary | ICD-10-CM | POA: Diagnosis not present

## 2023-06-15 DIAGNOSIS — R279 Unspecified lack of coordination: Secondary | ICD-10-CM | POA: Diagnosis not present

## 2023-06-15 DIAGNOSIS — I10 Essential (primary) hypertension: Secondary | ICD-10-CM | POA: Diagnosis not present

## 2023-06-15 DIAGNOSIS — I4891 Unspecified atrial fibrillation: Secondary | ICD-10-CM | POA: Diagnosis not present

## 2023-06-20 DIAGNOSIS — M6281 Muscle weakness (generalized): Secondary | ICD-10-CM | POA: Diagnosis not present

## 2023-06-20 DIAGNOSIS — I10 Essential (primary) hypertension: Secondary | ICD-10-CM | POA: Diagnosis not present

## 2023-06-20 DIAGNOSIS — Z9181 History of falling: Secondary | ICD-10-CM | POA: Diagnosis not present

## 2023-06-20 DIAGNOSIS — R279 Unspecified lack of coordination: Secondary | ICD-10-CM | POA: Diagnosis not present

## 2023-06-20 DIAGNOSIS — I4891 Unspecified atrial fibrillation: Secondary | ICD-10-CM | POA: Diagnosis not present

## 2023-06-21 DIAGNOSIS — I10 Essential (primary) hypertension: Secondary | ICD-10-CM | POA: Diagnosis not present

## 2023-06-21 DIAGNOSIS — M6281 Muscle weakness (generalized): Secondary | ICD-10-CM | POA: Diagnosis not present

## 2023-06-21 DIAGNOSIS — I499 Cardiac arrhythmia, unspecified: Secondary | ICD-10-CM | POA: Diagnosis not present

## 2023-06-21 DIAGNOSIS — R278 Other lack of coordination: Secondary | ICD-10-CM | POA: Diagnosis not present

## 2023-06-22 ENCOUNTER — Ambulatory Visit (INDEPENDENT_AMBULATORY_CARE_PROVIDER_SITE_OTHER): Payer: Medicare HMO | Admitting: Family Medicine

## 2023-06-22 ENCOUNTER — Encounter: Payer: Self-pay | Admitting: Family Medicine

## 2023-06-22 VITALS — BP 130/98 | HR 83 | Temp 97.8°F | Ht <= 58 in | Wt 110.0 lb

## 2023-06-22 DIAGNOSIS — H906 Mixed conductive and sensorineural hearing loss, bilateral: Secondary | ICD-10-CM | POA: Diagnosis not present

## 2023-06-22 DIAGNOSIS — I5082 Biventricular heart failure: Secondary | ICD-10-CM | POA: Diagnosis not present

## 2023-06-22 DIAGNOSIS — I10 Essential (primary) hypertension: Secondary | ICD-10-CM | POA: Diagnosis not present

## 2023-06-22 DIAGNOSIS — M6281 Muscle weakness (generalized): Secondary | ICD-10-CM | POA: Diagnosis not present

## 2023-06-22 DIAGNOSIS — R279 Unspecified lack of coordination: Secondary | ICD-10-CM | POA: Diagnosis not present

## 2023-06-22 DIAGNOSIS — Z9181 History of falling: Secondary | ICD-10-CM | POA: Diagnosis not present

## 2023-06-22 DIAGNOSIS — I4891 Unspecified atrial fibrillation: Secondary | ICD-10-CM | POA: Diagnosis not present

## 2023-06-22 DIAGNOSIS — I5032 Chronic diastolic (congestive) heart failure: Secondary | ICD-10-CM

## 2023-06-22 DIAGNOSIS — I4821 Permanent atrial fibrillation: Secondary | ICD-10-CM | POA: Diagnosis not present

## 2023-06-22 MED ORDER — METOPROLOL SUCCINATE ER 200 MG PO TB24
200.0000 mg | ORAL_TABLET | Freq: Every evening | ORAL | 3 refills | Status: AC
Start: 2023-06-22 — End: 2024-06-21

## 2023-06-22 MED ORDER — TORSEMIDE 20 MG PO TABS
20.0000 mg | ORAL_TABLET | Freq: Two times a day (BID) | ORAL | 3 refills | Status: DC
Start: 2023-06-22 — End: 2023-08-24

## 2023-06-22 NOTE — Progress Notes (Signed)
Subjective:  Patient ID: Norma Barajas, female    DOB: 1941/02/11, 82 y.o.   MRN: [id number]  Patient Care Team: Sonny Masters, FNP as PCP - General (Family Medicine) Rollene Rotunda, MD as PCP - Cardiology (Cardiology) West Bali, MD (Inactive) as Attending Physician (Gastroenterology) Derryl Harbor, OD as Consulting Physician (Optometry) Newman Nip, NP (Inactive) as Consulting Physician (Cardiology)   Chief Complaint:  Medication Problem   HPI: Norma Barajas is a 82 y.o. female presenting on 06/22/2023 for Medication Problem    1. 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 Would like to take earlier in the day to help mitigate nocturia   2. Permanent atrial fibrillation (HCC) Has been doing very well with Toprol XL dosing. No palpitations. HR has been well controlled. She is able to walk to the cafeteria several times per day without distress.   3. Mixed conductive and sensorineural hearing loss of both ears Was referred for hearing testing, has not received an appointment.      Relevant past medical, surgical, family, and social history reviewed and updated as indicated.  Allergies and medications reviewed and updated. Data reviewed: Chart in Epic.   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 Nonobstructive minimal CAD 10/2005.   Depression    Diastolic dysfunction    Grade 1. Ejection fraction 60-65%.   Dysrhythmia    a fib   Erosive esophagitis    Essential hypertension    Fracture of ramus of right pubis with routine healing 12/09/2020   GERD  (gastroesophageal reflux disease)    Grade III hemorrhoids    History of hiatal hernia    History of prediabetes    Hypercholesterolemia    Internal hemorrhoids with complication 07/29/2008   OCT 2015 FLEX SIG/IH BANDING     Intertrochanteric fracture of femur (HCC)    5/16 - 03/26/2021 fracture sustained in a mechanical fall.  IM nailing performed 5/17 by Dr Thane Edu Warfarin held because of posttraumatic pelvic hematoma.  Postop DVT with 81 mg aspirin twice daily x28 days.   Major depressive disorder, recurrent, moderate (HCC) 12/09/2020   Migraine    "used to have them right bad; I don't now" (04/11/2015)   MITRAL REGURGITATION 04/27/2010   Qualifier: Diagnosis of  By: Eden Emms, MD, Harrington Challenger    Osteoporosis    Paroxysmal atrial fibrillation [facility])    Pelvic hematoma, female 03/27/2021   Sustained in mechanical fall.  Warfarin held.  IM nailing performed 5/17 with postop DVT prophylaxis 81 mg twice daily x28 days.   PONV (postoperative nausea and vomiting)    Vertigo     Past Surgical History:  Procedure Laterality Date   BRAVO [facility] STUDY  11/15/2012   Procedure: BRAVO PH STUDY;  Surgeon: West Bali, MD;  Location: [facility];  Service: Endoscopy;;   CARDIOVERSION N/A 05/16/2020   Procedure: CARDIOVERSION;  Surgeon: Little Ishikawa, MD;  Location: [facility] ENDOSCOPY;  Service: Cardiovascular;  Laterality: N/A;   CATARACT EXTRACTION W/ INTRAOCULAR LENS  IMPLANT,  BILATERAL Bilateral    COLONOSCOPY  2008   Dr. Darrick Penna: internal hemorrhoids    DILATION AND CURETTAGE OF UTERUS     ESOPHAGEAL DILATION N/A 06/16/2021   Procedure: ESOPHAGEAL DILATION;  Surgeon: Dolores Frame, MD;  Location: [facility];  Service: Gastroenterology;  Laterality: N/A;   ESOPHAGOGASTRODUODENOSCOPY (EGD) WITH ESOPHAGEAL DILATION  2001   Dr. Arlyce Dice: erosive esophagitis, esophageal stricture, duodenitis, s/p Savary dilation   ESOPHAGOGASTRODUODENOSCOPY (EGD) WITH ESOPHAGEAL DILATION   11/15/2012   HQI:[id number] web was found & MOST LIKELY CAUSE FOR DYAPHAGIA/Polyp was found in the gastric body and gastric fundus/ gastritis on bx   ESOPHAGOGASTRODUODENOSCOPY (EGD) WITH PROPOFOL N/A 06/16/2021   Procedure: ESOPHAGOGASTRODUODENOSCOPY (EGD) WITH PROPOFOL;  Surgeon: Dolores Frame, MD;  Location: [facility];  Service: Gastroenterology;  Laterality: N/A;  8:15   EYE SURGERY Bilateral    "laser OR after cataract OR; cause I couldn't see"   FLEXIBLE SIGMOIDOSCOPY N/A 08/22/2014   mild diverticulosis in sigmoid, moderate sized Grade 3 hemorrhoids s/p banding X 3.    FRACTURE SURGERY Right    below the knee - 2 bones broke has plates   HEMORRHOID BANDING N/A 08/22/2014   Procedure: HEMORRHOID BANDING;  Surgeon: West Bali, MD;  Location: [facility];  Service: Endoscopy;  Laterality: N/A;   HEMORRHOID SURGERY N/A 03/30/2018   Procedure: EXTENSIVE HEMORRHOIDECTOMY;  Surgeon: Lucretia Roers, MD;  Location: [facility] ORS;  Service: General;  Laterality: N/A;   INTRAMEDULLARY (IM) NAIL INTERTROCHANTERIC Right 03/24/2021   Procedure: INTRAMEDULLARY (IM) NAIL INTERTROCHANTRIC;  Surgeon: Oliver Barre, MD;  Location: [facility] ORS;  Service: Orthopedics;  Laterality: Right;   OPEN REDUCTION INTERNAL FIXATION (ORIF) TIBIA/FIBULA FRACTURE Right 2013   broke tibia and fibula after falling down stairs   PROLAPSED UTERINE FIBROID LIGATION  2015   TOTAL ABDOMINAL HYSTERECTOMY      Social History   Socioeconomic History   Marital status: Widowed    Spouse name: george    Number of children: 1   Years of education: Not on file   Highest education level: Not on file  Occupational History   Occupation: Retired    Comment: Textile  Tobacco Use   Smoking status: Never   Smokeless tobacco: Never  Vaping Use   Vaping status: Never Used  Substance and Sexual Activity   Alcohol use: No    Alcohol/week: 0.0 standard drinks of alcohol   Drug use: No   Sexual activity: Not Currently     Birth control/protection: Surgical, Post-menopausal    Comment: hyst  Other Topics Concern   Not on file  Social History Narrative   Married   No regular exercise   Social Determinants of Health   Financial Resource Strain: Not on file  Food Insecurity: No Food Insecurity (04/27/2023)   Hunger Vital Sign    Worried About Running Out of Food in the Last Year: Never true    Ran Out of Food in the Last Year: Never true  Transportation Needs: No Transportation Needs (04/27/2023)   PRAPARE - Administrator, Civil Service (Medical): No    Lack of Transportation (Non-Medical): No  Physical Activity: Not on file  Stress: Not on file  Social Connections: Not on file  Intimate Partner Violence: Not At Risk (04/27/2023)   Humiliation, Afraid, Rape, and Kick questionnaire    Fear of Current or Ex-Partner: No    Emotionally Abused: No    Physically Abused: No    Sexually  Abused: No    Outpatient Encounter Medications as of 06/22/2023  Medication Sig   acetaminophen (TYLENOL) 500 MG tablet Take 2 tablets (1,000 mg total) by mouth every 6 (six) hours as needed for mild pain.   amLODipine (NORVASC) 2.5 MG tablet Take 2.5 mg by mouth daily.   ASPIRIN LOW DOSE 81 MG tablet TAKE 1 TABLET TWICE DAILY (Patient taking differently: Take 81 mg by mouth in the morning and at bedtime.)   bisacodyl (DULCOLAX) 10 MG suppository Place 1 suppository (10 mg total) rectally as needed for moderate constipation.   cholecalciferol (VITAMIN D3) 25 MCG (1000 UNIT) tablet Take 1,000 Units by mouth in the morning.   diltiazem (CARDIZEM) 30 MG tablet Take 1 tablet (30 mg total) by mouth every 12 (twelve) hours as needed (For HR >>> 120 BPM).   docusate sodium (COLACE) 100 MG capsule Take 1 capsule (100 mg total) by mouth every morning.   metoprolol (TOPROL XL) 200 MG 24 hr tablet Take 1 tablet (200 mg total) by mouth every evening.   omeprazole (PRILOSEC) 20 MG capsule TAKE 1 CAPSULE EVERY DAY    potassium chloride (KLOR-CON) 10 MEQ tablet Take 1 tablet (10 mEq total) by mouth daily. Take While taking Demadex/Torsemide   simvastatin (ZOCOR) 20 MG tablet Take 1 tablet (20 mg total) by mouth daily at 6 PM.   torsemide (DEMADEX) 20 MG tablet Take 1 tablet (20 mg total) by mouth 2 (two) times daily. Breakfast and lunch   traZODone (DESYREL) 50 MG tablet Take 1 tablet (50 mg total) by mouth at bedtime.   [DISCONTINUED] metoprolol (TOPROL XL) 200 MG 24 hr tablet Take 1 tablet (200 mg total) by mouth every evening.   [DISCONTINUED] torsemide (DEMADEX) 20 MG tablet Take 1 tablet (20 mg total) by mouth 2 (two) times daily.   No facility-administered encounter medications on file as of 06/22/2023.    Allergies  Allergen Reactions   Codeine Nausea And Vomiting   Doxycycline Other (See Comments)    Chest congestion   Triamterene-Hctz Other (See Comments)    weakness   Atorvastatin Other (See Comments)    Myalgias    Crestor [Rosuvastatin Calcium] Other (See Comments)    weakness   Morphine Nausea Only   Risedronate Sodium Other (See Comments)    ACTONEL - reflux    Review of Systems  Constitutional:  Negative for activity change, appetite change, chills, fatigue and fever.  HENT: Negative.    Eyes: Negative.   Respiratory:  Negative for cough, chest tightness and shortness of breath.   Cardiovascular:  Negative for chest pain, palpitations and leg swelling.  Gastrointestinal:  Negative for blood in stool, constipation, diarrhea, nausea and vomiting.  Endocrine: Negative.   Genitourinary:  Negative for dysuria, frequency and urgency.       Nocturia associated with late torsemide dosing  Musculoskeletal:  Negative for arthralgias and myalgias.  Skin: Negative.   Allergic/Immunologic: Negative.   Neurological:  Negative for dizziness and headaches.  Hematological: Negative.   Psychiatric/Behavioral:  Negative for confusion, hallucinations, sleep disturbance and suicidal ideas.    All other systems reviewed and are negative.       Objective:  BP (!) 130/98   Pulse 83   Temp 97.8 F (36.6 C) (Temporal)   Ht 4\' 8"  (1.422 m)   Wt 110 lb (49.9 kg)   SpO2 94%   BMI 24.66 kg/m    Wt Readings from Last 3 Encounters:  06/22/23 110 lb (49.9 kg)  06/02/23 109 lb (49.4 kg)  05/27/23 108 lb 3.2 oz (49.1 kg)    Physical Exam Vitals and nursing note reviewed.  Constitutional:      General: She is not in acute distress.    Appearance: Normal appearance. She is ill-appearing (chronically). She is not toxic-appearing or diaphoretic.  HENT:     Head: Normocephalic and atraumatic.     Right Ear: Decreased hearing noted.     Left Ear: Decreased hearing noted.     Mouth/Throat:     Mouth: Mucous membranes are moist.     Pharynx: Oropharynx is clear.  Eyes:     Conjunctiva/sclera: Conjunctivae normal.     Pupils: Pupils are equal, round, and reactive to light.  Cardiovascular:     Rate and Rhythm: Normal rate. Rhythm irregularly irregular.     Heart sounds: Murmur heard.     Systolic murmur is present with a grade of 3/6.  Pulmonary:     Effort: Pulmonary effort is normal.     Breath sounds: Normal breath sounds. No rhonchi or rales.  Skin:    General: Skin is warm and dry.     Capillary Refill: Capillary refill takes less than 2 seconds.  Neurological:     General: No focal deficit present.     Mental Status: She is alert and oriented to person, place, and time.     Gait: Gait abnormal (using rolling walker).  Psychiatric:        Mood and Affect: Mood normal.        Behavior: Behavior normal.        Thought Content: Thought content normal.        Judgment: Judgment normal.     Results for orders placed or performed in visit on 05/27/23  CBC with Differential/Platelet  Result Value Ref Range   WBC 7.3 3.4 - 10.8 x10E3/uL   RBC 4.34 3.77 - 5.28 x10E6/uL   Hemoglobin 14.0 11.1 - 15.9 g/dL   Hematocrit 38.7 56.4 - 46.6 %   MCV 99 (H) 79 - 97 fL    MCH 32.3 26.6 - 33.0 pg   MCHC 32.7 31.5 - 35.7 g/dL   RDW 33.2 95.1 - 88.4 %   Platelets 171 150 - 450 x10E3/uL   Neutrophils 73 Not Estab. %   Lymphs 14 Not Estab. %   Monocytes 10 Not Estab. %   Eos 2 Not Estab. %   Basos 1 Not Estab. %   Neutrophils Absolute 5.3 1.4 - 7.0 x10E3/uL   Lymphocytes Absolute 1.0 0.7 - 3.1 x10E3/uL   Monocytes Absolute 0.8 0.1 - 0.9 x10E3/uL   EOS (ABSOLUTE) 0.1 0.0 - 0.4 x10E3/uL   Basophils Absolute 0.1 0.0 - 0.2 x10E3/uL   Immature Granulocytes 0 Not Estab. %   Immature Grans (Abs) 0.0 0.0 - 0.1 x10E3/uL  BMP8+EGFR  Result Value Ref Range   Glucose 82 70 - 99 mg/dL   BUN 22 8 - 27 mg/dL   Creatinine, Ser 1.66 (H) 0.57 - 1.00 mg/dL   eGFR 45 (L) >06 TK/ZSW/1.09   BUN/Creatinine Ratio 18 12 - 28   Sodium 141 134 - 144 mmol/L   Potassium 4.0 3.5 - 5.2 mmol/L   Chloride 96 96 - 106 mmol/L   CO2 30 (H) 20 - 29 mmol/L   Calcium 9.0 8.7 - 10.3 mg/dL  Magnesium  Result Value Ref Range   Magnesium 2.3 1.6 - 2.3 mg/dL       Pertinent labs & imaging  results that were available during my care of the patient were reviewed by me and considered in my medical decision making.  Assessment & Plan:  Malai was seen today for medication problem.  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 Will change dosing to breakfast and lunch to see if beneficial.  -     torsemide (DEMADEX) 20 MG tablet; Take 1 tablet (20 mg total) by mouth 2 (two) times daily. Breakfast and lunch  Permanent atrial fibrillation (HCC) Doing well with Toprol XL dosing. HR well controlled. Will continue.  -     metoprolol (TOPROL XL) 200 MG 24 hr tablet; Take 1 tablet (200 mg total) by mouth every evening.  Mixed conductive and sensorineural hearing loss of both ears Referral has been placed, information provided to pt to schedule an  appointment.     Continue all other maintenance medications.  Follow up plan: Return if symptoms worsen or fail to improve.   Continue healthy lifestyle choices, including diet (rich in fruits, vegetables, and lean proteins, and low in salt and simple carbohydrates) and exercise (at least 30 minutes of moderate physical activity daily).    The above assessment and management plan was discussed with the patient. The patient verbalized understanding of and has agreed to the management plan. Patient is aware to call the clinic if they develop any new symptoms or if symptoms persist or worsen. Patient is aware when to return to the clinic for a follow-up visit. Patient educated on when it is appropriate to go to the emergency department.   Kari Baars, FNP-C [facility] [phone]

## 2023-06-22 NOTE — Patient Instructions (Addendum)
[facility] [address] [place], [place] [address] [phone]  Call to schedule an appointment.

## 2023-06-23 DIAGNOSIS — M6281 Muscle weakness (generalized): Secondary | ICD-10-CM | POA: Diagnosis not present

## 2023-06-23 DIAGNOSIS — I10 Essential (primary) hypertension: Secondary | ICD-10-CM | POA: Diagnosis not present

## 2023-06-23 DIAGNOSIS — I499 Cardiac arrhythmia, unspecified: Secondary | ICD-10-CM | POA: Diagnosis not present

## 2023-06-23 DIAGNOSIS — R278 Other lack of coordination: Secondary | ICD-10-CM | POA: Diagnosis not present

## 2023-06-27 DIAGNOSIS — I4891 Unspecified atrial fibrillation: Secondary | ICD-10-CM | POA: Diagnosis not present

## 2023-06-27 DIAGNOSIS — M6281 Muscle weakness (generalized): Secondary | ICD-10-CM | POA: Diagnosis not present

## 2023-06-27 DIAGNOSIS — R279 Unspecified lack of coordination: Secondary | ICD-10-CM | POA: Diagnosis not present

## 2023-06-27 DIAGNOSIS — Z9181 History of falling: Secondary | ICD-10-CM | POA: Diagnosis not present

## 2023-06-27 DIAGNOSIS — I10 Essential (primary) hypertension: Secondary | ICD-10-CM | POA: Diagnosis not present

## 2023-06-28 DIAGNOSIS — I10 Essential (primary) hypertension: Secondary | ICD-10-CM | POA: Diagnosis not present

## 2023-06-28 DIAGNOSIS — R278 Other lack of coordination: Secondary | ICD-10-CM | POA: Diagnosis not present

## 2023-06-28 DIAGNOSIS — M6281 Muscle weakness (generalized): Secondary | ICD-10-CM | POA: Diagnosis not present

## 2023-06-28 DIAGNOSIS — I499 Cardiac arrhythmia, unspecified: Secondary | ICD-10-CM | POA: Diagnosis not present

## 2023-06-29 ENCOUNTER — Other Ambulatory Visit: Payer: Self-pay | Admitting: Family Medicine

## 2023-06-29 DIAGNOSIS — R279 Unspecified lack of coordination: Secondary | ICD-10-CM | POA: Diagnosis not present

## 2023-06-29 DIAGNOSIS — I4891 Unspecified atrial fibrillation: Secondary | ICD-10-CM | POA: Diagnosis not present

## 2023-06-29 DIAGNOSIS — Z9181 History of falling: Secondary | ICD-10-CM | POA: Diagnosis not present

## 2023-06-29 DIAGNOSIS — M6281 Muscle weakness (generalized): Secondary | ICD-10-CM | POA: Diagnosis not present

## 2023-06-29 DIAGNOSIS — I10 Essential (primary) hypertension: Secondary | ICD-10-CM | POA: Diagnosis not present

## 2023-06-30 DIAGNOSIS — I10 Essential (primary) hypertension: Secondary | ICD-10-CM | POA: Diagnosis not present

## 2023-06-30 DIAGNOSIS — M6281 Muscle weakness (generalized): Secondary | ICD-10-CM | POA: Diagnosis not present

## 2023-06-30 DIAGNOSIS — R278 Other lack of coordination: Secondary | ICD-10-CM | POA: Diagnosis not present

## 2023-06-30 DIAGNOSIS — I499 Cardiac arrhythmia, unspecified: Secondary | ICD-10-CM | POA: Diagnosis not present

## 2023-07-01 ENCOUNTER — Telehealth: Payer: Self-pay | Admitting: Family Medicine

## 2023-07-01 ENCOUNTER — Other Ambulatory Visit: Payer: Self-pay

## 2023-07-01 ENCOUNTER — Encounter (HOSPITAL_COMMUNITY): Payer: Self-pay | Admitting: Emergency Medicine

## 2023-07-01 ENCOUNTER — Observation Stay (HOSPITAL_COMMUNITY)
Admission: EM | Admit: 2023-07-01 | Discharge: 2023-07-02 | Disposition: A | Discharge status: Home / Self Care | Payer: Medicare HMO | Attending: Internal Medicine | Admitting: Internal Medicine

## 2023-07-01 ENCOUNTER — Emergency Department (HOSPITAL_COMMUNITY): Payer: Medicare HMO

## 2023-07-01 DIAGNOSIS — N184 Chronic kidney disease, stage 4 (severe): Secondary | ICD-10-CM | POA: Diagnosis present

## 2023-07-01 DIAGNOSIS — R42 Dizziness and giddiness: Principal | ICD-10-CM

## 2023-07-01 DIAGNOSIS — I5082 Biventricular heart failure: Secondary | ICD-10-CM

## 2023-07-01 DIAGNOSIS — Z79899 Other long term (current) drug therapy: Secondary | ICD-10-CM | POA: Diagnosis not present

## 2023-07-01 DIAGNOSIS — I480 Paroxysmal atrial fibrillation: Secondary | ICD-10-CM | POA: Diagnosis not present

## 2023-07-01 DIAGNOSIS — I5032 Chronic diastolic (congestive) heart failure: Secondary | ICD-10-CM | POA: Diagnosis present

## 2023-07-01 DIAGNOSIS — I10 Essential (primary) hypertension: Secondary | ICD-10-CM | POA: Diagnosis present

## 2023-07-01 DIAGNOSIS — I5030 Unspecified diastolic (congestive) heart failure: Secondary | ICD-10-CM | POA: Insufficient documentation

## 2023-07-01 DIAGNOSIS — K219 Gastro-esophageal reflux disease without esophagitis: Secondary | ICD-10-CM | POA: Diagnosis not present

## 2023-07-01 DIAGNOSIS — N1831 Chronic kidney disease, stage 3a: Secondary | ICD-10-CM | POA: Diagnosis not present

## 2023-07-01 DIAGNOSIS — E782 Mixed hyperlipidemia: Secondary | ICD-10-CM | POA: Diagnosis not present

## 2023-07-01 DIAGNOSIS — I129 Hypertensive chronic kidney disease with stage 1 through stage 4 chronic kidney disease, or unspecified chronic kidney disease: Secondary | ICD-10-CM | POA: Diagnosis not present

## 2023-07-01 DIAGNOSIS — R0789 Other chest pain: Secondary | ICD-10-CM | POA: Diagnosis not present

## 2023-07-01 DIAGNOSIS — R110 Nausea: Secondary | ICD-10-CM | POA: Diagnosis not present

## 2023-07-01 DIAGNOSIS — R29818 Other symptoms and signs involving the nervous system: Secondary | ICD-10-CM | POA: Diagnosis not present

## 2023-07-01 DIAGNOSIS — E785 Hyperlipidemia, unspecified: Secondary | ICD-10-CM | POA: Diagnosis present

## 2023-07-01 LAB — CBC WITH DIFFERENTIAL/PLATELET
Abs Immature Granulocytes: 0.03 10*3/uL (ref 0.00–0.07)
Basophils Absolute: 0.1 10*3/uL (ref 0.0–0.1)
Basophils Relative: 1 %
Eosinophils Absolute: 0.1 10*3/uL (ref 0.0–0.5)
Eosinophils Relative: 1 %
HCT: 44 % (ref 36.0–46.0)
Hemoglobin: 14.5 g/dL (ref 12.0–15.0)
Immature Granulocytes: 0 %
Lymphocytes Relative: 9 %
Lymphs Abs: 0.8 10*3/uL (ref 0.7–4.0)
MCH: 32.4 pg (ref 26.0–34.0)
MCHC: 33 g/dL (ref 30.0–36.0)
MCV: 98.2 fL (ref 80.0–100.0)
Monocytes Absolute: 0.7 10*3/uL (ref 0.1–1.0)
Monocytes Relative: 8 %
Neutro Abs: 7.6 10*3/uL (ref 1.7–7.7)
Neutrophils Relative %: 81 %
Platelets: 141 10*3/uL — ABNORMAL LOW (ref 150–400)
RBC: 4.48 MIL/uL (ref 3.87–5.11)
RDW: 12.5 % (ref 11.5–15.5)
WBC: 9.3 10*3/uL (ref 4.0–10.5)
nRBC: 0 % (ref 0.0–0.2)

## 2023-07-01 LAB — BASIC METABOLIC PANEL
Anion gap: 10 (ref 5–15)
BUN: 24 mg/dL — ABNORMAL HIGH (ref 8–23)
CO2: 31 mmol/L (ref 22–32)
Calcium: 8.6 mg/dL — ABNORMAL LOW (ref 8.9–10.3)
Chloride: 94 mmol/L — ABNORMAL LOW (ref 98–111)
Creatinine, Ser: 0.96 mg/dL (ref 0.44–1.00)
GFR, Estimated: 58 mL/min — ABNORMAL LOW (ref >60)
Glucose, Bld: 126 mg/dL — ABNORMAL HIGH (ref 70–99)
Potassium: 3.5 mmol/L (ref 3.5–5.1)
Sodium: 135 mmol/L (ref 135–145)

## 2023-07-01 LAB — URINALYSIS, ROUTINE W REFLEX MICROSCOPIC
Bilirubin Urine: NEGATIVE
Glucose, UA: NEGATIVE mg/dL
Hgb urine dipstick: NEGATIVE
Ketones, ur: NEGATIVE mg/dL
Leukocytes,Ua: NEGATIVE
Nitrite: NEGATIVE
Protein, ur: NEGATIVE mg/dL
Specific Gravity, Urine: 1.009 (ref 1.005–1.030)
pH: 8 (ref 5.0–8.0)

## 2023-07-01 LAB — MAGNESIUM: Magnesium: 2.1 mg/dL (ref 1.7–2.4)

## 2023-07-01 LAB — PHOSPHORUS: Phosphorus: 3.6 mg/dL (ref 2.5–4.6)

## 2023-07-01 LAB — TROPONIN I (HIGH SENSITIVITY)
Troponin I (High Sensitivity): 4 ng/L (ref <18)
Troponin I (High Sensitivity): 4 ng/L (ref <18)

## 2023-07-01 LAB — TSH: TSH: 1.034 u[IU]/mL (ref 0.350–4.500)

## 2023-07-01 LAB — VITAMIN B12: Vitamin B-12: 310 pg/mL (ref 180–914)

## 2023-07-01 MED ORDER — MECLIZINE HCL 12.5 MG PO TABS
25.0000 mg | ORAL_TABLET | Freq: Once | ORAL | Status: AC
  Administered 2023-07-01: 25 mg via ORAL
  Filled 2023-07-01: qty 2

## 2023-07-01 MED ORDER — MECLIZINE HCL 12.5 MG PO TABS: 25.0000 mg | ORAL_TABLET | Freq: Three times a day (TID) | ORAL | Status: DC | PRN

## 2023-07-01 MED ORDER — POTASSIUM CHLORIDE CRYS ER 10 MEQ PO TBCR
10.0000 meq | EXTENDED_RELEASE_TABLET | Freq: Every day | ORAL | Status: DC
  Administered 2023-07-02: 10 meq via ORAL
  Filled 2023-07-01: qty 1

## 2023-07-01 MED ORDER — SODIUM CHLORIDE 0.9 % IV BOLUS
500.0000 mL | Freq: Once | INTRAVENOUS | Status: AC
  Administered 2023-07-01: 500 mL via INTRAVENOUS

## 2023-07-01 MED ORDER — SIMVASTATIN 20 MG PO TABS
20.0000 mg | ORAL_TABLET | Freq: Every day | ORAL | Status: DC
  Administered 2023-07-01: 20 mg via ORAL
  Filled 2023-07-01: qty 1

## 2023-07-01 MED ORDER — TRAZODONE HCL 50 MG PO TABS
50.0000 mg | ORAL_TABLET | Freq: Every day | ORAL | Status: DC
  Administered 2023-07-01: 50 mg via ORAL
  Filled 2023-07-01: qty 1

## 2023-07-01 MED ORDER — TORSEMIDE 20 MG PO TABS
20.0000 mg | ORAL_TABLET | Freq: Two times a day (BID) | ORAL | Status: DC
  Administered 2023-07-01 – 2023-07-02 (×2): 20 mg via ORAL
  Filled 2023-07-01 (×2): qty 1

## 2023-07-01 MED ORDER — ONDANSETRON HCL 4 MG/2ML IJ SOLN: 4.0000 mg | Freq: Four times a day (QID) | INTRAMUSCULAR | Status: DC | PRN

## 2023-07-01 MED ORDER — SODIUM CHLORIDE 0.9 % IV SOLN: INTRAVENOUS | Status: AC

## 2023-07-01 MED ORDER — ONDANSETRON HCL 4 MG PO TABS: 4.0000 mg | ORAL_TABLET | Freq: Four times a day (QID) | ORAL | Status: DC | PRN

## 2023-07-01 MED ORDER — ACETAMINOPHEN 650 MG RE SUPP: 650.0000 mg | Freq: Four times a day (QID) | RECTAL | Status: DC | PRN

## 2023-07-01 MED ORDER — ASPIRIN 81 MG PO TBEC
81.0000 mg | DELAYED_RELEASE_TABLET | Freq: Two times a day (BID) | ORAL | Status: DC
  Administered 2023-07-01 – 2023-07-02 (×2): 81 mg via ORAL
  Filled 2023-07-01 (×2): qty 1

## 2023-07-01 MED ORDER — ACETAMINOPHEN 325 MG PO TABS
650.0000 mg | ORAL_TABLET | Freq: Four times a day (QID) | ORAL | Status: DC | PRN
  Administered 2023-07-02: 650 mg via ORAL
  Filled 2023-07-01: qty 2

## 2023-07-01 MED ORDER — HEPARIN SODIUM (PORCINE) 5000 UNIT/ML IJ SOLN
5000.0000 [IU] | Freq: Three times a day (TID) | INTRAMUSCULAR | Status: DC
  Administered 2023-07-01 – 2023-07-02 (×2): 5000 [IU] via SUBCUTANEOUS
  Filled 2023-07-01 (×2): qty 1

## 2023-07-01 MED ORDER — METOPROLOL SUCCINATE ER 50 MG PO TB24
200.0000 mg | ORAL_TABLET | Freq: Every evening | ORAL | Status: DC
  Administered 2023-07-01: 200 mg via ORAL
  Filled 2023-07-01: qty 4

## 2023-07-01 MED ORDER — PANTOPRAZOLE SODIUM 40 MG PO TBEC
40.0000 mg | DELAYED_RELEASE_TABLET | Freq: Every day | ORAL | Status: DC
  Administered 2023-07-02: 40 mg via ORAL
  Filled 2023-07-01: qty 1

## 2023-07-01 NOTE — Assessment & Plan Note (Signed)
Continue statin. 

## 2023-07-01 NOTE — Assessment & Plan Note (Signed)
Continue PPI ?

## 2023-07-01 NOTE — ED Notes (Signed)
Patient transported to MRI 

## 2023-07-01 NOTE — H&P (Signed)
History and Physical    Patient: Norma Barajas UXL:[id number] DOB: 11-05-1941 DOA: 07/01/2023 DOS: the patient was seen and examined on 07/01/2023 PCP: Sonny Masters, FNP  Patient coming from: ALF/ILF  Chief Complaint:  Chief Complaint  Patient presents with   Dizziness   HPI: Norma Barajas is a 82 y.o. female with medical history significant of diastolic heart failure, paroxysmal atrial fibrillation, gastroesophageal flux disease, hypertension and hyperlipidemia; who presented to the hospital secondary to dizziness and associated weakness.  Patient symptoms presented this morning after she woke up; and there has not been any associated focal deficits, nausea, vomiting, chest pain, shortness of breath, dysuria, hematuria, melena or any other complaints.  In the ED workup demonstrated negative MRI for acute intracranial abnormalities.  Meclizine provided with some improvement in her symptoms and [facility] has been contacted to place patient in the hospital for further management of what appears vertigo event.   Review of Systems: As mentioned in the history of present illness. All other systems reviewed and are negative. 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 Nonobstructive minimal CAD 10/2005.   Depression    Diastolic dysfunction    Grade 1. Ejection fraction 60-65%.   Dysrhythmia    a fib   Erosive esophagitis    Essential hypertension    Fracture of ramus of right pubis with routine healing 12/09/2020   GERD (gastroesophageal reflux disease)    Grade III hemorrhoids    History of hiatal hernia    History of prediabetes    Hypercholesterolemia    Internal hemorrhoids with complication 07/29/2008   OCT 2015 FLEX SIG/IH BANDING     Intertrochanteric fracture of  femur (HCC)    5/16 - 03/26/2021 fracture sustained in a mechanical fall.  IM nailing performed 5/17 by Dr Thane Edu Warfarin held because of posttraumatic pelvic hematoma.  Postop DVT with 81 mg aspirin twice daily x28 days.   Major depressive disorder, recurrent, moderate (HCC) 12/09/2020   Migraine    "used to have them right bad; I don't now" (04/11/2015)   MITRAL REGURGITATION 04/27/2010   Qualifier: Diagnosis of  By: Eden Emms, MD, Harrington Challenger    Osteoporosis    Paroxysmal atrial fibrillation [facility])    Pelvic hematoma, female 03/27/2021   Sustained in mechanical fall.  Warfarin held.  IM nailing performed 5/17 with postop DVT prophylaxis 81 mg twice daily x28 days.   PONV (postoperative nausea and vomiting)    Vertigo    Past Surgical History:  Procedure Laterality Date   BRAVO [facility] STUDY  11/15/2012   Procedure: BRAVO PH STUDY;  Surgeon: West Bali, MD;  Location: [facility] ENDO SU[facility];  Service: Endoscopy;;   CARDIOVERSION N/A 05/16/2020   Procedure: CARDIOVERSION;  Surgeon: Little Ishikawa, MD;  Location: [facility] ENDOSCOPY;  Service: Cardiovascular;  Laterality: N/A;   CATARACT EXTRACTION W/ INTRAOCULAR LENS  IMPLANT, BILATERAL Bilateral    COLONOSCOPY  2008   Dr. Darrick Penna: internal hemorrhoids    DILATION AND CURETTAGE OF UTERUS     ESOPHAGEAL DILATION N/A 06/16/2021   Procedure: ESOPHAGEAL DILATION;  Surgeon: Dolores Frame, MD;  Location: [facility] ENDO SUITE;  Service: Gastroenterology;  Laterality: N/A;   ESOPHAGOGASTRODUODENOSCOPY (EGD) WITH ESOPHAGEAL  DILATION  2001   Dr. Arlyce Dice: erosive esophagitis, esophageal stricture, duodenitis, s/p Savary dilation   ESOPHAGOGASTRODUODENOSCOPY (EGD) WITH ESOPHAGEAL DILATION  11/15/2012   [id number] web was found & MOST LIKELY CAUSE FOR DYAPHAGIA/Polyp was found in the gastric body and gastric fundus/ gastritis on bx   ESOPHAGOGASTRODUODENOSCOPY (EGD) WITH PROPOFOL N/A 06/16/2021   Procedure: ESOPHAGOGASTRODUODENOSCOPY (EGD) WITH  PROPOFOL;  Surgeon: Dolores Frame, MD;  Location: [facility];  Service: Gastroenterology;  Laterality: N/A;  8:15   EYE SURGERY Bilateral    "laser OR after cataract OR; cause I couldn't see"   FLEXIBLE SIGMOIDOSCOPY N/A 08/22/2014   mild diverticulosis in sigmoid, moderate sized Grade 3 hemorrhoids s/p banding X 3.    FRACTURE SURGERY Right    below the knee - 2 bones broke has plates   HEMORRHOID BANDING N/A 08/22/2014   Procedure: HEMORRHOID BANDING;  Surgeon: West Bali, MD;  Location: [facility];  Service: Endoscopy;  Laterality: N/A;   HEMORRHOID SURGERY N/A 03/30/2018   Procedure: EXTENSIVE HEMORRHOIDECTOMY;  Surgeon: Lucretia Roers, MD;  Location: [facility] ORS;  Service: General;  Laterality: N/A;   INTRAMEDULLARY (IM) NAIL INTERTROCHANTERIC Right 03/24/2021   Procedure: INTRAMEDULLARY (IM) NAIL INTERTROCHANTRIC;  Surgeon: Oliver Barre, MD;  Location: [facility] ORS;  Service: Orthopedics;  Laterality: Right;   OPEN REDUCTION INTERNAL FIXATION (ORIF) TIBIA/FIBULA FRACTURE Right 2013   broke tibia and fibula after falling down stairs   PROLAPSED UTERINE FIBROID LIGATION  2015   TOTAL ABDOMINAL HYSTERECTOMY     Social History:  reports that she has never smoked. She has never used smokeless tobacco. She reports that she does not drink alcohol and does not use drugs.  Allergies  Allergen Reactions   Codeine Nausea And Vomiting   Doxycycline Other (See Comments)    Chest congestion   Triamterene-Hctz Other (See Comments)    weakness   Atorvastatin Other (See Comments)    Myalgias    Crestor [Rosuvastatin Calcium] Other (See Comments)    weakness   Morphine Nausea Only   Risedronate Sodium Other (See Comments)    ACTONEL - reflux    Family History  Problem Relation Age of Onset   Colon cancer Mother 63   Heart disease Mother    Osteoporosis Mother    Hip fracture Mother    Stroke Sister    Diabetes Sister    Osteoporosis Sister    Arthritis Sister     Uterine cancer Sister    Stroke Sister    Heart disease Father    Hyperlipidemia Brother    Hypertension Brother    Heart disease Brother    Stroke Brother    Heart disease Sister    Dementia Sister    Diabetes Son    Stroke Son    Heart attack Neg Hx     Prior to Admission medications   Medication Sig Start Date End Date Taking? Authorizing Provider  acetaminophen (TYLENOL) 500 MG tablet Take 2 tablets (1,000 mg total) by mouth every 6 (six) hours as needed for mild pain. 05/04/23  Yes Rakes, Doralee Albino, FNP  amLODipine (NORVASC) 2.5 MG tablet Take 2.5 mg by mouth daily.   Yes [provider]  ASPIRIN LOW DOSE 81 MG tablet TAKE 1 TABLET TWICE DAILY 06/23/22  Yes Deliah Boston F, FNP  bisacodyl (DULCOLAX) 10 MG suppository Place 1 suppository (10 mg total) rectally as needed for moderate constipation. 01/06/22  Yes Deliah Boston F, FNP  cholecalciferol (VITAMIN D3) 25  MCG (1000 UNIT) tablet Take 1,000 Units by mouth in the morning.   Yes [provider]  diltiazem (CARDIZEM) 30 MG tablet Take 1 tablet (30 mg total) by mouth every 12 (twelve) hours as needed (For HR >>> 120 BPM). 05/19/23  Yes Shon Hale, MD  docusate sodium (COLACE) 100 MG capsule Take 1 capsule (100 mg total) by mouth every morning. 05/04/23  Yes Rakes, Doralee Albino, FNP  metoprolol (TOPROL XL) 200 MG 24 hr tablet Take 1 tablet (200 mg total) by mouth every evening. 06/22/23 06/21/24 Yes Rakes, Doralee Albino, FNP  omeprazole (PRILOSEC) 20 MG capsule TAKE 1 CAPSULE EVERY DAY 02/28/23  Yes Rakes, Doralee Albino, FNP  potassium chloride (KLOR-CON) 10 MEQ tablet Take 1 tablet (10 mEq total) by mouth daily. Take While taking Demadex/Torsemide 05/19/23  Yes Shon Hale, MD  simvastatin (ZOCOR) 20 MG tablet Take 1 tablet (20 mg total) by mouth daily at 6 PM. 04/28/23  Yes Johnson, Clanford L, MD  torsemide (DEMADEX) 20 MG tablet Take 1 tablet (20 mg total) by mouth 2 (two) times daily. Breakfast and lunch 06/22/23  Yes Rakes,  Doralee Albino, FNP  traZODone (DESYREL) 50 MG tablet Take 1 tablet (50 mg total) by mouth at bedtime. 05/19/23  Yes Shon Hale, MD    Physical Exam: Vitals:   07/01/23 1459 07/01/23 1545 07/01/23 1600 07/01/23 1645  BP:  131/85 127/82 (!) 153/101  Pulse:  90 93 90  Resp:  (!) 21 20   Temp: 98.1 F (36.7 C)  98 F (36.7 C) 98.1 F (36.7 C)  TempSrc: Oral  Oral Oral  SpO2:  94% 94% 97%   General exam: No chest pain, no nausea, no vomiting, no palpitations.  Patient is afebrile. Respiratory system: Good saturation; good air movement bilaterally, no crackles, no wheezing. Cardiovascular system: Rate controlled, no rubs, no gallops.  No JVD on exam. Gastrointestinal system: Abdomen is nondistended, soft and nontender. No organomegaly or masses felt. Normal bowel sounds heard. Central nervous system:  No focal neurological deficits. Extremities: No cyanosis or clubbing. Skin: No petechiae. Psychiatry: Mood & affect appropriate.   Data Reviewed: Basic metabolic panel: Sodium 135, potassium 3.5, chloride 94, bicarb 31, BUN 24, creatinine 0.96 and GFR 58 CBC: White blood cells 9.3, hemoglobin 14.5 and platelet count 141K Troponin: 4>>4 Magnesium: 2.1   Assessment and Plan: * Vertigo -Patient reporting dizziness sensation -MRI negative for acute intracranial abnormality -Will check TSH, B12 and monitor on telemetry -Treatment with meclizine will be provided. -Physical therapy has been requested for vestibular evaluation and if needed maneuvers to stabilize small ear bones. -Gentle fluid resuscitation will be provided overnight -Follow clinical response   Stage 3a chronic kidney disease (HCC) -Appears stable and at baseline -Continue to follow renal function trend and minimize as much as possible nephrotoxic agents and the use of contrast.  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) -Stable and compensated -Continue daily weights, strict  I's and O's and heart healthy diet. -Resume home congestive heart failure regimen. -Continue patient follow-up with cardiology service.  Paroxysmal atrial fibrillation (HCC) -Telemetry monitoring provided -Continue the use of metoprolol -Aspirin for secondary prevention; patient high risk for anticoagulation usage due to frequent falls.  HTN (hypertension) -Stable -Continue current antihypertensive agents -Heart healthy diet ordered.  GERD -Continue PPI  Hyperlipidemia -Continue statin.   Advance Care Planning:   Code Status: DNR   Consults: None  Family Communication: No family at bedside.  Severity of Illness: The  appropriate patient status for this patient is OBSERVATION. Observation status is judged to be reasonable and necessary in order to provide the required intensity of service to ensure the patient's safety. The patient's presenting symptoms, physical exam findings, and initial radiographic and laboratory data in the context of their medical condition is felt to place them at decreased risk for further clinical deterioration. Furthermore, it is anticipated that the patient will be medically stable for discharge from the hospital within 2 midnights of admission.   Author: Vassie Loll, MD 07/01/2023 5:10 PM  For on call review [email or web address].

## 2023-07-01 NOTE — Telephone Encounter (Signed)
Okay please make sure she has a soon follow-up for her blood pressure, likely preferably within the next week or 2

## 2023-07-01 NOTE — ED Provider Notes (Signed)
[facility] MEDICAL SURGICAL UNIT Provider Note   CSN: [id number] Arrival date & time: 07/01/23  1053     History  Chief Complaint  Patient presents with   Dizziness    Norma Barajas is a 82 y.o. female.  She said she woke up this morning and had acute dizziness vertigo.  She said the room was spinning.  She said this has happened before but it has been many years.  Associated with nausea and had some dry heaves.  She was given some Zofran by EMS.  She denies any focal numbness or weakness no blurry vision double vision. The history is provided by the patient and the EMS personnel.  Dizziness Quality:  Room spinning Severity:  Severe Onset quality:  Sudden Timing:  Constant Progression:  Unchanged Chronicity:  New Relieved by:  Nothing Worsened by:  Movement and sitting upright Ineffective treatments:  Being still Associated symptoms: nausea and vomiting   Associated symptoms: no chest pain, no shortness of breath, no tinnitus, no vision changes and no weakness        Home Medications Prior to Admission medications   Medication Sig Start Date End Date Taking? Authorizing Provider  acetaminophen (TYLENOL) 500 MG tablet Take 2 tablets (1,000 mg total) by mouth every 6 (six) hours as needed for mild pain. 05/04/23  Yes Rakes, Doralee Albino, FNP  amLODipine (NORVASC) 2.5 MG tablet Take 2.5 mg by mouth daily.   Yes [provider]  ASPIRIN LOW DOSE 81 MG tablet TAKE 1 TABLET TWICE DAILY 06/23/22  Yes Deliah Boston F, FNP  bisacodyl (DULCOLAX) 10 MG suppository Place 1 suppository (10 mg total) rectally as needed for moderate constipation. 01/06/22  Yes Deliah Boston F, FNP  cholecalciferol (VITAMIN D3) 25 MCG (1000 UNIT) tablet Take 1,000 Units by mouth in the morning.   Yes [provider]  diltiazem (CARDIZEM) 30 MG tablet Take 1 tablet (30 mg total) by mouth every 12 (twelve) hours as needed (For HR >>> 120 BPM). 05/19/23  Yes Shon Hale, MD  docusate sodium  (COLACE) 100 MG capsule Take 1 capsule (100 mg total) by mouth every morning. 05/04/23  Yes Rakes, Doralee Albino, FNP  metoprolol (TOPROL XL) 200 MG 24 hr tablet Take 1 tablet (200 mg total) by mouth every evening. 06/22/23 06/21/24 Yes Rakes, Doralee Albino, FNP  omeprazole (PRILOSEC) 20 MG capsule TAKE 1 CAPSULE EVERY DAY 02/28/23  Yes Rakes, Doralee Albino, FNP  potassium chloride (KLOR-CON) 10 MEQ tablet Take 1 tablet (10 mEq total) by mouth daily. Take While taking Demadex/Torsemide 05/19/23  Yes Shon Hale, MD  simvastatin (ZOCOR) 20 MG tablet Take 1 tablet (20 mg total) by mouth daily at 6 PM. 04/28/23  Yes Johnson, Clanford L, MD  torsemide (DEMADEX) 20 MG tablet Take 1 tablet (20 mg total) by mouth 2 (two) times daily. Breakfast and lunch 06/22/23  Yes Rakes, Doralee Albino, FNP  traZODone (DESYREL) 50 MG tablet Take 1 tablet (50 mg total) by mouth at bedtime. 05/19/23  Yes Shon Hale, MD      Allergies    Codeine, Doxycycline, Triamterene-hctz, Atorvastatin, Crestor [rosuvastatin calcium], Morphine, and Risedronate sodium    Review of Systems   Review of Systems  Constitutional:  Negative for fever.  HENT:  Negative for tinnitus.   Eyes:  Negative for visual disturbance.  Respiratory:  Negative for shortness of breath.   Cardiovascular:  Negative for chest pain.  Gastrointestinal:  Positive for nausea and vomiting.  Neurological:  Positive for  dizziness. Negative for weakness.    Physical Exam Updated Vital Signs BP (!) 155/100   Pulse 90   Temp 98.1 F (36.7 C) (Oral)   Resp (!) 21   SpO2 97%  Physical Exam Vitals and nursing note reviewed.  Constitutional:      General: She is not in acute distress.    Appearance: Normal appearance. She is well-developed.  HENT:     Head: Normocephalic and atraumatic.  Eyes:     Conjunctiva/sclera: Conjunctivae normal.  Cardiovascular:     Rate and Rhythm: Normal rate and regular rhythm.     Heart sounds: No murmur heard. Pulmonary:     Effort:  Pulmonary effort is normal. No respiratory distress.     Breath sounds: Normal breath sounds.  Abdominal:     Palpations: Abdomen is soft.     Tenderness: There is no abdominal tenderness. There is no guarding or rebound.  Musculoskeletal:        General: No swelling.     Cervical back: Neck supple.  Skin:    General: Skin is warm and dry.     Capillary Refill: Capillary refill takes less than 2 seconds.  Neurological:     General: No focal deficit present.     Mental Status: She is alert and oriented to person, place, and time.     Cranial Nerves: No cranial nerve deficit.     Sensory: No sensory deficit.     Motor: No weakness.     ED Results / Procedures / Treatments   Labs (all labs ordered are listed, but only abnormal results are displayed) Labs Reviewed  BASIC METABOLIC PANEL - Abnormal; Notable for the following components:      Result Value   Chloride 94 (*)    Glucose, Bld 126 (*)    BUN 24 (*)    Calcium 8.6 (*)    GFR, Estimated 58 (*)    All other components within normal limits  CBC WITH DIFFERENTIAL/PLATELET - Abnormal; Notable for the following components:   Platelets 141 (*)    All other components within normal limits  MAGNESIUM  URINALYSIS, ROUTINE W REFLEX MICROSCOPIC  VITAMIN B12  TSH  PHOSPHORUS  BASIC METABOLIC PANEL  TROPONIN I (HIGH SENSITIVITY)  TROPONIN I (HIGH SENSITIVITY)    EKG EKG Interpretation Date/Time:  Friday July 01 2023 11:15:01 EDT Ventricular Rate:  89 PR Interval:    QRS Duration:  83 QT Interval:  370 QTC Calculation: 451 R Axis:   43  Text Interpretation: Atrial fibrillation No significant change since prior 7/24 Confirmed by Meridee Score [phone]) on 07/01/2023 11:22:31 AM  Radiology MR Brain Wo Contrast (neuro protocol)  Result Date: 07/01/2023 CLINICAL DATA:  Neuro deficit, acute, stroke suspected. Dizziness, nausea. EXAM: MRI HEAD WITHOUT CONTRAST TECHNIQUE: Multiplanar, multiecho pulse sequences of the brain  and surrounding structures were obtained without intravenous contrast. COMPARISON:  None Available. FINDINGS: Brain: No acute infarct or hemorrhage. Moderate chronic small-vessel disease generalized volume loss within normal limits for age. No hydrocephalus or extra-axial collection. No mass or midline shift. Sulcal susceptibility in the left occipital lobe, likely sequela of prior subarachnoid hemorrhage. Vascular: Normal flow voids. Skull and upper cervical spine: Normal marrow signal. Sinuses/Orbits: No acute findings. Other: None. IMPRESSION: 1. No acute intracranial abnormality or mass. 2. Moderate chronic small-vessel disease. Electronically Signed   By: Orvan Falconer M.D.   On: 07/01/2023 14:40    Procedures Procedures    Medications Ordered in ED Medications  heparin injection 5,000 Units (has no administration in time range)  0.9 %  sodium chloride infusion (has no administration in time range)  meclizine (ANTIVERT) tablet 25 mg (has no administration in time range)  aspirin EC tablet 81 mg (has no administration in time range)  metoprolol succinate (TOPROL-XL) 24 hr tablet 200 mg (has no administration in time range)  pantoprazole (PROTONIX) EC tablet 40 mg (has no administration in time range)  simvastatin (ZOCOR) tablet 20 mg (has no administration in time range)  potassium chloride (KLOR-CON M) CR tablet 10 mEq (has no administration in time range)  torsemide (DEMADEX) tablet 20 mg (has no administration in time range)  traZODone (DESYREL) tablet 50 mg (has no administration in time range)  acetaminophen (TYLENOL) tablet 650 mg (has no administration in time range)    Or  acetaminophen (TYLENOL) suppository 650 mg (has no administration in time range)  ondansetron (ZOFRAN) tablet 4 mg (has no administration in time range)    Or  ondansetron (ZOFRAN) injection 4 mg (has no administration in time range)  sodium chloride 0.9 % bolus 500 mL (500 mLs Intravenous Bolus 07/01/23 1126)   meclizine (ANTIVERT) tablet 25 mg (25 mg Oral Given 07/01/23 1126)    ED Course/ Medical Decision Making/ A&P Clinical Course as of 07/01/23 1734  Fri Jul 01, 2023  1447 Patient returns from MRI.  Her MRI does not show any acute findings and the rest of her workup has been negative.  She has had meclizine and fluids.  She still remains very symptomatic and dizzy nauseous does not feel like she can be discharged.  Lives in assisted living. [MB]    Clinical Course User Index [MB] Terrilee Files, MD                                 Medical Decision Making Amount and/or Complexity of Data Reviewed Labs: ordered. Radiology: ordered.  Risk Decision regarding hospitalization.   This patient complains of dizziness nausea; this involves an extensive number of treatment Options and is a complaint that carries with it a high risk of complications and morbidity. The differential includes vertigo, stroke, dehydration, orthostasis, metabolic derangement  I ordered, reviewed and interpreted labs, which included CBC normal other than mildly low platelets, chemistries with elevated glucose, troponins flat, urinalysis without signs of infection I ordered medication IV fluids oral meclizine and reviewed PMP when indicated. I ordered imaging studies which included MRI brain and I independently    visualized and interpreted imaging which showed no acute findings Additional history obtained from patient's family member Previous records obtained and reviewed in epic, she was admitted last month for atrial fibrillation I consulted Dr. Gwenlyn Perking [facility] hospital[facility] and discussed lab and imaging findings and discussed disposition.  Cardiac monitoring reviewed, sinus rhythm Social determinants considered, no significant barriers Critical Interventions: None  After the interventions stated above, I reevaluated the patient and found patient to be neuro intact although unable to get out of bed and  ambulate Admission and further testing considered, she would benefit from mission the hospital until she is less symptomatic.  Appreciate Dr. Gwenlyn Perking admitting her to the hospital so she can get further therapy.         Final Clinical Impression(s) / ED Diagnoses Final diagnoses:  Vertigo    Rx / DC Orders ED Discharge Orders     None         Charm Barges,  Kayleen Memos, MD 07/01/23 [phone]

## 2023-07-01 NOTE — ED Triage Notes (Signed)
Pt arrived via [facility] from [facility] [place] c/o dizziness that started after ambulating and sitting down that is worse with movement. Hx of vertigo, CHF, A fib. Also c/o CP, EMS gave 4 mg of zofran and 325 of aspirin en route to ED. When asked if she is sob she states "a little bit"

## 2023-07-01 NOTE — ED Notes (Signed)
Pt given water, currently on PW at this time as she feels "very dizzy and nauseous" with any movement, and turning

## 2023-07-01 NOTE — Assessment & Plan Note (Signed)
-  Appears stable and at baseline -Continue to follow renal function trend and minimize as much as possible nephrotoxic agents and the use of contrast.

## 2023-07-01 NOTE — Assessment & Plan Note (Signed)
-  Stable and compensated -Continue daily weights, strict I's and O's and heart healthy diet. -Resume home congestive heart failure regimen. -Continue patient follow-up with cardiology service.

## 2023-07-01 NOTE — Assessment & Plan Note (Signed)
-  Stable -Continue current antihypertensive agents -Heart healthy diet ordered.

## 2023-07-01 NOTE — Assessment & Plan Note (Signed)
-  Patient reporting dizziness sensation -MRI negative for acute intracranial abnormality -Will check TSH, B12 and monitor on telemetry -Treatment with meclizine will be provided. -Physical therapy has been requested for vestibular evaluation and if needed maneuvers to stabilize small ear bones. -Gentle fluid resuscitation will be provided overnight -Follow clinical response

## 2023-07-01 NOTE — ED Notes (Signed)
DNR band placed on R wrist

## 2023-07-01 NOTE — Assessment & Plan Note (Signed)
-  Telemetry monitoring provided -Continue the use of metoprolol -Aspirin for secondary prevention; patient high risk for anticoagulation usage due to frequent falls.

## 2023-07-01 NOTE — Telephone Encounter (Signed)
Appt scheduled with Rakes 8/28 at 11:50 for evaluation

## 2023-07-02 DIAGNOSIS — I5082 Biventricular heart failure: Secondary | ICD-10-CM | POA: Diagnosis not present

## 2023-07-02 DIAGNOSIS — R42 Dizziness and giddiness: Secondary | ICD-10-CM | POA: Diagnosis not present

## 2023-07-02 DIAGNOSIS — I5032 Chronic diastolic (congestive) heart failure: Secondary | ICD-10-CM | POA: Diagnosis not present

## 2023-07-02 DIAGNOSIS — N1831 Chronic kidney disease, stage 3a: Secondary | ICD-10-CM | POA: Diagnosis not present

## 2023-07-02 LAB — BASIC METABOLIC PANEL WITH GFR
Anion gap: 12 (ref 5–15)
BUN: 16 mg/dL (ref 8–23)
CO2: 33 mmol/L — ABNORMAL HIGH (ref 22–32)
Calcium: 8.8 mg/dL — ABNORMAL LOW (ref 8.9–10.3)
Chloride: 94 mmol/L — ABNORMAL LOW (ref 98–111)
Creatinine, Ser: 0.9 mg/dL (ref 0.44–1.00)
GFR, Estimated: >60 mL/min
Glucose, Bld: 94 mg/dL (ref 70–99)
Potassium: 3 mmol/L — ABNORMAL LOW (ref 3.5–5.1)
Sodium: 139 mmol/L (ref 135–145)

## 2023-07-02 MED ORDER — POTASSIUM CHLORIDE CRYS ER 20 MEQ PO TBCR
40.0000 meq | EXTENDED_RELEASE_TABLET | Freq: Once | ORAL | Status: AC
  Administered 2023-07-02: 40 meq via ORAL
  Filled 2023-07-02: qty 2

## 2023-07-02 MED ORDER — MECLIZINE HCL 25 MG PO TABS: 25.0000 mg | ORAL_TABLET | Freq: Three times a day (TID) | ORAL | 1 refills | Status: DC | PRN

## 2023-07-02 MED ORDER — MECLIZINE HCL 25 MG PO TABS: 25.0000 mg | ORAL_TABLET | Freq: Three times a day (TID) | ORAL | 0 refills | Status: DC | PRN

## 2023-07-02 NOTE — Plan of Care (Signed)

## 2023-07-02 NOTE — Progress Notes (Signed)
Nsg Discharge Note  Admit Date:  07/01/2023 Discharge date: 07/02/2023   Norma Barajas to be D/C'd Skilled nursing facility per MD order.  AVS complete.  Patient/caregiver able to verbalize understanding.  Discharge Medication: Allergies as of 07/02/2023       Reactions   Codeine Nausea And Vomiting   Doxycycline Other (See Comments)   Chest congestion   Triamterene-hctz Other (See Comments)   weakness   Atorvastatin Other (See Comments)   Myalgias   Crestor [rosuvastatin Calcium] Other (See Comments)   weakness   Morphine Nausea Only   Risedronate Sodium Other (See Comments)   ACTONEL - reflux        Medication List     TAKE these medications    acetaminophen 500 MG tablet Commonly known as: TYLENOL Take 2 tablets (1,000 mg total) by mouth every 6 (six) hours as needed for mild pain.   amLODipine 2.5 MG tablet Commonly known as: NORVASC Take 2.5 mg by mouth daily.   Aspirin Low Dose 81 MG tablet Generic drug: aspirin EC TAKE 1 TABLET TWICE DAILY   bisacodyl 10 MG suppository Commonly known as: Dulcolax Place 1 suppository (10 mg total) rectally as needed for moderate constipation.   cholecalciferol 25 MCG (1000 UNIT) tablet Commonly known as: VITAMIN D3 Take 1,000 Units by mouth in the morning.   Colace 100 MG capsule Generic drug: docusate sodium Take 1 capsule (100 mg total) by mouth every morning.   diltiazem 30 MG tablet Commonly known as: CARDIZEM Take 1 tablet (30 mg total) by mouth every 12 (twelve) hours as needed (For HR >>> 120 BPM).   meclizine 25 MG tablet Commonly known as: ANTIVERT Take 1 tablet (25 mg total) by mouth 3 (three) times daily as needed for dizziness.   metoprolol 200 MG 24 hr tablet Commonly known as: Toprol XL Take 1 tablet (200 mg total) by mouth every evening.   omeprazole 20 MG capsule Commonly known as: PRILOSEC TAKE 1 CAPSULE EVERY DAY   potassium chloride 10 MEQ tablet Commonly known as: KLOR-CON Take 1 tablet  (10 mEq total) by mouth daily. Take While taking Demadex/Torsemide   simvastatin 20 MG tablet Commonly known as: ZOCOR Take 1 tablet (20 mg total) by mouth daily at 6 PM.   torsemide 20 MG tablet Commonly known as: DEMADEX Take 1 tablet (20 mg total) by mouth 2 (two) times daily. Breakfast and lunch   traZODone 50 MG tablet Commonly known as: DESYREL Take 1 tablet (50 mg total) by mouth at bedtime.        Discharge Assessment: Vitals:   07/02/23 0537 07/02/23 0554  BP: (!) 158/91 (!) 140/88  Pulse: 81 83  Resp: 16 16  Temp: 98.1 F (36.7 C) 98 F (36.7 C)  SpO2: 96% 100%   Skin clean, dry and intact without evidence of skin break down, no evidence of skin tears noted. IV catheter discontinued intact. Site without signs and symptoms of complications - no redness or edema noted at insertion site, patient denies c/o pain - only slight tenderness at site.  Dressing with slight pressure applied.  D/c Instructions-Education: Discharge instructions given to patient/family with verbalized understanding. D/c education completed with patient/family including follow up instructions, medication list, d/c activities limitations if indicated, with other d/c instructions as indicated by MD - patient able to verbalize understanding, all questions fully answered. Patient instructed to return to ED, call 911, or call MD for any changes in condition.  Patient escorted via WC,  and D/C home via private auto.  Laurena Spies, RN 07/02/2023 9:40 AM

## 2023-07-02 NOTE — Evaluation (Signed)
Physical Therapy Evaluation Patient Details Name: Norma Barajas MRN: [id number] DOB: 07-Feb-1941 Today's Date: 07/02/2023  History of Present Illness  Per MD :82 year old female with a history of BPPV, chronic atrial fibrillation, hypertension, hyperlipidemia, diastolic CHF, GERD presenting with generalized weakness and dizziness when she woke up in the morning of 07/01/2023.  The patient stated that she felt like the room was spinning around.  She denied any fevers, chills, chest pain, short of breath, cough, hemoptysis.  She did have some dry heaving associated with the dizziness.  She denied any abdominal pain, dysuria, hematuria, focal extremity weakness.  The patient does not feel comfortable going back home.  As result, the patient was admitted for further evaluation and treatment  Clinical Impression  PT sx are not indicative of BPPV as dizziness is constant .  PT is positive with eye gaze exercises.  Attempted to explain to pt that she needed to exercise her vestibular system by moving her head and eyes, however pt is not very receptive.  Pt will need someone to be walking by her side at all times if she is to return to facility and will benefit from a [facility] vestibular trained therapist.;          If plan is discharge home, recommend the following: A little help with walking and/or transfers;Assist for transportation;Assistance with cooking/housework;A little help with bathing/dressing/bathroom   Can travel by private vehicle        Equipment Recommendations None recommended by PT  Recommendations for Other Services       Functional Status Assessment Patient has had a recent decline in their functional status and demonstrates the ability to make significant improvements in function in a reasonable and predictable amount of time.     Precautions / Restrictions Precautions Precautions: Fall Restrictions Weight Bearing Restrictions: No      Mobility  Bed Mobility Overal bed mobility:  Needs Assistance Bed Mobility: Supine to Sit, Sit to Supine     Supine to sit: Min assist Sit to supine: Min assist        Transfers Overall transfer level: Needs assistance Equipment used: Rolling walker (2 wheels) Transfers: Sit to/from Stand Sit to Stand: Contact guard assist                Ambulation/Gait Ambulation/Gait assistance: Contact guard assist Gait Distance (Feet): 25 Feet Assistive device: Rolling walker (2 wheels) Gait Pattern/deviations: Decreased step length - right, Decreased step length - left   Gait velocity interpretation: <1.31 ft/sec, indicative of household ambulator   General Gait Details: slow, pt dizziness inceases as pt walks.  By the time pt gets back to the bed she states "I'm going to fall down", and plops on the bed         Pertinent Vitals/Pain Pain Assessment Pain Assessment: No/denies pain       Prior Function    Lives in an assisted living                    Extremity/Trunk Assessment        Lower Extremity Assessment Lower Extremity Assessment: Generalized weakness       Communication   Communication Communication: No apparent difficulties Cueing Techniques: Verbal cues  Cognition Arousal: Alert   Overall Cognitive Status: Within Functional Limits for tasks assessed  Exercises Other Exercises Other Exercises: Attempted to explain eye gaze and saccades execises to pt , however, pt is not very receptive   Assessment/Plan    PT Assessment Patient needs continued PT services ([facility] vestibular theapist)  PT Problem List Other (comment) (dizziness)       PT Treatment Interventions Therapeutic exercise;Functional mobility training;Balance training    PT Goals (Current goals can be found in the Care Plan section)       Frequency Min 3X/week        AM-PAC PT "6 Clicks" Mobility  Outcome Measure Help needed turning from your back to  your side while in a flat bed without using bedrails?: A Little Help needed moving from lying on your back to sitting on the side of a flat bed without using bedrails?: A Little Help needed moving to and from a bed to a chair (including a wheelchair)?: A Little Help needed standing up from a chair using your arms (e.g., wheelchair or bedside chair)?: A Little Help needed to walk in hospital room?: A Little Help needed climbing 3-5 steps with a railing? : A Lot 6 Click Score: 17    End of Session Equipment Utilized During Treatment: Gait belt Activity Tolerance: Other (comment) (dizziness) Patient left: in bed   PT Visit Diagnosis: Unsteadiness on feet (R26.81);Dizziness and giddiness (R42)    Time: 4401-0272 PT Time Calculation (min) (ACUTE ONLY): 18 min   Charges:   PT Evaluation $PT Eval Low Complexity: 1 Low   PT General Charges $$ ACUTE PT VISIT: 1 Visit         Virgina Organ, PT CLT [phone]  07/02/2023, 10:13 AM

## 2023-07-02 NOTE — [place] FL2 (Signed)
[place] MEDICAID FL2 LEVEL OF CARE FORM     IDENTIFICATION  Patient Name: Norma Barajas Birthdate: July 07, 1941 Sex: female Admission Date (Current Location): 07/01/2023  [facility] and [place] Number:  [employer] and Address:  [facility],  [address] [place] [address]      Provider Number:    Attending Physician Name and Address:  Catarina Hartshorn, MD  Relative Name and Phone Number:  Ernest Mallick (Niece)  [phone] (Mobile)    Current Level of Care: Hospital Recommended Level of Care: Assisted Living Facility (Return to NP of [place]) Prior Approval Number:    Date Approved/Denied:   PASRR Number:    Discharge Plan: (S) Other (Comment)    Current Diagnoses: Patient Active Problem List   Diagnosis Date Noted   Vertigo 07/01/2023   Afib (HCC) 04/27/2023   Atrial fibrillation with RVR (HCC) 04/16/2023   Stage 3a chronic kidney disease (HCC) 11/03/2022   Iron deficiency 11/03/2022   Nonspecific abnormal electrocardiogram (ECG) (EKG) 08/29/2022   Abnormal SPEP 02/07/2022   Degenerative disc disease, lumbar 12/20/2021   Hypertensive heart disease with chronic diastolic congestive heart failure (HCC) 03/27/2021   Gallbladder sludge 03/27/2021   Aortic atherosclerosis (HCC) 03/27/2021   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) 10/23/2020   Severe tricuspid valve regurgitation 10/23/2020   Hypokalemia 09/16/2020   Early satiety 07/15/2020   Difficulty sleeping 06/22/2019   Paroxysmal atrial fibrillation (HCC) 08/21/2015   Lumbar pain 08/07/2015   Drug induced constipation 12/14/2013   HTN (hypertension) 07/24/2013   Coronary atherosclerosis of native coronary artery 07/24/2013   Pulmonary nodule 02/18/2011   Hyperlipidemia 07/29/2008   GAD (generalized anxiety disorder) 07/29/2008   Primary hypertension 07/29/2008   GERD 07/29/2008   Osteoporosis 07/29/2008    Orientation RESPIRATION  BLADDER Height & Weight        Normal Continent Weight: 112 lb 3.4 oz (50.9 kg) Height:  4\' 8"  (142.2 cm)  BEHAVIORAL SYMPTOMS/MOOD NEUROLOGICAL BOWEL NUTRITION STATUS      Incontinent Diet  AMBULATORY STATUS COMMUNICATION OF NEEDS Skin     Verbally  (Dry)                       Personal Care Assistance Level of Assistance  Bathing, Feeding, Dressing Bathing Assistance: Limited assistance Feeding assistance: Limited assistance Dressing Assistance: Limited assistance     Functional Limitations Info  Sight, Hearing Sight Info: Impaired Hearing Info: Impaired      SPECIAL CARE FACTORS FREQUENCY                       Contractures      Additional Factors Info  Code Status Code Status Info: DNR             Current Medications (07/02/2023):  This is the current hospital active medication list Current Facility-Administered Medications  Medication Dose Route Frequency Provider Last Rate Last Admin   acetaminophen (TYLENOL) tablet 650 mg  650 mg Oral Q6H PRN Vassie Loll, MD   650 mg at 07/02/23 0535   Or   acetaminophen (TYLENOL) suppository 650 mg  650 mg Rectal Q6H PRN Vassie Loll, MD       aspirin EC tablet 81 mg  81 mg Oral BID Vassie Loll, MD   81 mg at 07/02/23 0816   heparin injection 5,000 Units  5,000 Units Subcutaneous Myriam Jacobson, MD   5,000 Units at 07/02/23 [phone]  meclizine (ANTIVERT) tablet 25 mg  25 mg Oral TID PRN Vassie Loll, MD       metoprolol succinate (TOPROL-XL) 24 hr tablet 200 mg  200 mg Oral QPM Vassie Loll, MD   200 mg at 07/01/23 1734   ondansetron (ZOFRAN) tablet 4 mg  4 mg Oral Q6H PRN Vassie Loll, MD       Or   ondansetron [facility]) injection 4 mg  4 mg Intravenous Q6H PRN Vassie Loll, MD       pantoprazole (PROTONIX) EC tablet 40 mg  40 mg Oral Daily Vassie Loll, MD   40 mg at 07/02/23 0816   simvastatin (ZOCOR) tablet 20 mg  20 mg Oral q1800 Vassie Loll, MD   20 mg at 07/01/23 1735   torsemide  (DEMADEX) tablet 20 mg  20 mg Oral BID Vassie Loll, MD   20 mg at 07/02/23 0816   traZODone (DESYREL) tablet 50 mg  50 mg Oral QHS Vassie Loll, MD   50 mg at 07/01/23 2157     Discharge Medications: Please see discharge summary for a list of discharge medications.  Relevant Imaging Results:  Relevant Lab Results:   Additional Information    Avni Traore I Ninetta Lights, LCSW

## 2023-07-02 NOTE — Hospital Course (Addendum)
82 year old female with a history of BPPV, chronic atrial fibrillation, hypertension, hyperlipidemia, diastolic CHF, GERD presenting with generalized weakness and dizziness when she woke up in the morning of 07/01/2023.  The patient stated that she felt like the room was spinning around.  She denied any fevers, chills, chest pain, short of breath, cough, hemoptysis.  She did have some dry heaving associated with the dizziness.  She denied any abdominal pain, dysuria, hematuria, focal extremity weakness.  The patient does not feel comfortable going back home.  As result, the patient was admitted for further evaluation and treatment.  She denies any new medications. In the ED, the patient was afebrile and hemodynamically stable with oxygen saturation 100% on room air.  WBC 9.3, hemoglobin 14.5, platelets 171,000.  Sodium 139, potassium 3.0, bicarbonate 33, serum creatinine 0.90. MRI of the brain was negative for any acute findings. The patient was given a 500 cc normal saline bolus and started on maintenance IV fluids. Troponin 4>> 4 UA negative for pyuria EKG showed atrial fibrillation nonspecific T wave change. In the morning of 07/02/2023, the patient was improved symptomatically.  She did not have any further dizziness or emesis.  She was tolerating her diet.  The patient ambulated with PT without difficulty.  She was discharged back to her facility.

## 2023-07-02 NOTE — Discharge Summary (Addendum)
Physician Discharge Summary   Patient: Norma Barajas MRN: [id number] DOB: Jun 29, 1941  Admit date:     07/01/2023  Discharge date: 07/02/23  Discharge Physician: Onalee Hua Miguelangel Korn   PCP: Sonny Masters, FNP   Recommendations at discharge:   Please follow up with primary care provider within 1-2 weeks  Please repeat BMP and CBC in one week     Hospital Course: 82 year old female with a history of BPPV, chronic atrial fibrillation, hypertension, hyperlipidemia, diastolic CHF, GERD presenting with generalized weakness and dizziness when she woke up in the morning of 07/01/2023.  The patient stated that she felt like the room was spinning around.  She denied any fevers, chills, chest pain, short of breath, cough, hemoptysis.  She did have some dry heaving associated with the dizziness.  She denied any abdominal pain, dysuria, hematuria, focal extremity weakness.  The patient does not feel comfortable going back home.  As result, the patient was admitted for further evaluation and treatment.  She denies any new medications. In the ED, the patient was afebrile and hemodynamically stable with oxygen saturation 100% on room air.  WBC 9.3, hemoglobin 14.5, platelets 171,000.  Sodium 139, potassium 3.0, bicarbonate 33, serum creatinine 0.90. MRI of the brain was negative for any acute findings. The patient was given a 500 cc normal saline bolus and started on maintenance IV fluids. Troponin 4>> 4 UA negative for pyuria EKG showed atrial fibrillation nonspecific T wave change. In the morning of 07/02/2023, the patient was improved symptomatically.  She did not have any further dizziness or emesis.  She was tolerating her diet.  The patient ambulated with PT without difficulty.  She was discharged back to her facility.  Assessment and Plan: Dizziness/vertigo -Likely multifactorial including BPPV, volume depletion, and possibly atrial fibrillation with RVR -Overall improved with IV fluids and meclizine -TSH  1.034 -Serum B12 310 -MRI brain negative for acute findings  Permanent atrial fibrillation -Overall rate controlled -reviewed tele--no prolonged RVR episodes.  No concerning dysrhythmia -Continue metoprolol succinate 200 mg daily -Continue aspirin -Not on anticoagulation secondary to frequent falls -Hx of previous cardioversions, refuses additional DCCV's. Unsure when Amiodarone was stopped. Failed sotalol, reported cost issues with dofetilide.   CKD stage IIIa -Baseline creatinine 0.7-1.0 -Stable  Chronic HFpEF -Clinically euvolemic -04/17/2023 echo EF 55%, flattened interventricular septum, mild decreased RV function, mild MR, severe TR -Continue home dose torsemide  Essential hypertension -Continue metoprolol succinate  Hyperlipidemia -Continue statin  GERD -Continue pantoprazole       Consultants: none Procedures performed: none  Disposition: Assisted living Diet recommendation:  Cardiac diet DISCHARGE MEDICATION: Allergies as of 07/02/2023       Reactions   Codeine Nausea And Vomiting   Doxycycline Other (See Comments)   Chest congestion   Triamterene-hctz Other (See Comments)   weakness   Atorvastatin Other (See Comments)   Myalgias   Crestor [rosuvastatin Calcium] Other (See Comments)   weakness   Morphine Nausea Only   Risedronate Sodium Other (See Comments)   ACTONEL - reflux        Medication List     TAKE these medications    acetaminophen 500 MG tablet Commonly known as: TYLENOL Take 2 tablets (1,000 mg total) by mouth every 6 (six) hours as needed for mild pain.   amLODipine 2.5 MG tablet Commonly known as: NORVASC Take 2.5 mg by mouth daily.   Aspirin Low Dose 81 MG tablet Generic drug: aspirin EC TAKE 1 TABLET TWICE DAILY   bisacodyl  10 MG suppository Commonly known as: Dulcolax Place 1 suppository (10 mg total) rectally as needed for moderate constipation.   cholecalciferol 25 MCG (1000 UNIT) tablet Commonly known as:  VITAMIN D3 Take 1,000 Units by mouth in the morning.   Colace 100 MG capsule Generic drug: docusate sodium Take 1 capsule (100 mg total) by mouth every morning.   diltiazem 30 MG tablet Commonly known as: CARDIZEM Take 1 tablet (30 mg total) by mouth every 12 (twelve) hours as needed (For HR >>> 120 BPM).   meclizine 25 MG tablet Commonly known as: ANTIVERT Take 1 tablet (25 mg total) by mouth 3 (three) times daily as needed for dizziness.   metoprolol 200 MG 24 hr tablet Commonly known as: Toprol XL Take 1 tablet (200 mg total) by mouth every evening.   omeprazole 20 MG capsule Commonly known as: PRILOSEC TAKE 1 CAPSULE EVERY DAY   potassium chloride 10 MEQ tablet Commonly known as: KLOR-CON Take 1 tablet (10 mEq total) by mouth daily. Take While taking Demadex/Torsemide   simvastatin 20 MG tablet Commonly known as: ZOCOR Take 1 tablet (20 mg total) by mouth daily at 6 PM.   torsemide 20 MG tablet Commonly known as: DEMADEX Take 1 tablet (20 mg total) by mouth 2 (two) times daily. Breakfast and lunch   traZODone 50 MG tablet Commonly known as: DESYREL Take 1 tablet (50 mg total) by mouth at bedtime.        Discharge Exam: Filed Weights   07/02/23 0500  Weight: 50.9 kg   HEENT:  [place]/AT, No thrush, no icterus CV:  IRRR, no rub, no S3, no S4 Lung:  CTA, no wheeze, no rhonchi Abd:  soft/+BS, NT Ext:  No edema, no lymphangitis, no synovitis, no rash Neuro:  CN II-XII intact, strength 4/5 in RUE, RLE, strength 4/5 LUE, LLE; sensation intact bilateral; no dysmetria; babinski equivocal    Condition at discharge: stable  The results of significant diagnostics from this hospitalization (including imaging, microbiology, ancillary and laboratory) are listed below for reference.   Imaging Studies: MR Brain Wo Contrast (neuro protocol)  Result Date: 07/01/2023 CLINICAL DATA:  Neuro deficit, acute, stroke suspected. Dizziness, nausea. EXAM: MRI HEAD WITHOUT CONTRAST  TECHNIQUE: Multiplanar, multiecho pulse sequences of the brain and surrounding structures were obtained without intravenous contrast. COMPARISON:  None Available. FINDINGS: Brain: No acute infarct or hemorrhage. Moderate chronic small-vessel disease generalized volume loss within normal limits for age. No hydrocephalus or extra-axial collection. No mass or midline shift. Sulcal susceptibility in the left occipital lobe, likely sequela of prior subarachnoid hemorrhage. Vascular: Normal flow voids. Skull and upper cervical spine: Normal marrow signal. Sinuses/Orbits: No acute findings. Other: None. IMPRESSION: 1. No acute intracranial abnormality or mass. 2. Moderate chronic small-vessel disease. Electronically Signed   By: Orvan Falconer M.D.   On: 07/01/2023 14:40    Microbiology: Results for orders placed or performed during the hospital encounter of 04/16/23  MRSA Next Gen by PCR, Nasal     Status: None   Collection Time: 04/16/23 10:30 PM   Specimen: Nasal Mucosa; Nasal Swab  Result Value Ref Range Status   MRSA by PCR Next Gen NOT DETECTED NOT DETECTED Final    Comment: (NOTE) The GeneXpert MRSA Assay (FDA approved for NASAL specimens only), is one component of a comprehensive MRSA colonization surveillance program. It is not intended to diagnose MRSA infection nor to guide or monitor treatment for MRSA infections. Test performance is not FDA approved in patients less  than 15 years old. Performed at [facility], [address], [place], [place] [address]     Labs: CBC: Recent Labs  Lab 07/01/23 1137  WBC 9.3  NEUTROABS 7.6  HGB 14.5  HCT 44.0  MCV 98.2  PLT 141*   Basic Metabolic Panel: Recent Labs  Lab 07/01/23 1137 07/01/23 1521 07/02/23 0545  NA 135  --  139  K 3.5  --  3.0*  CL 94*  --  94*  CO2 31  --  33*  GLUCOSE 126*  --  94  BUN 24*  --  16  CREATININE 0.96  --  0.90  CALCIUM 8.6*  --  8.8*  MG 2.1  --   --   PHOS  --  3.6  --    Liver Function Tests: No  results for input(s): "AST", "ALT", "ALKPHOS", "BILITOT", "PROT", "ALBUMIN" in the last 168 hours. CBG: No results for input(s): "GLUCAP" in the last 168 hours.  Discharge time spent: greater than 30 minutes.  Signed: Catarina Hartshorn, MD [facility] 07/02/2023

## 2023-07-04 DIAGNOSIS — I4891 Unspecified atrial fibrillation: Secondary | ICD-10-CM | POA: Diagnosis not present

## 2023-07-04 DIAGNOSIS — I10 Essential (primary) hypertension: Secondary | ICD-10-CM | POA: Diagnosis not present

## 2023-07-04 DIAGNOSIS — R279 Unspecified lack of coordination: Secondary | ICD-10-CM | POA: Diagnosis not present

## 2023-07-04 DIAGNOSIS — M6281 Muscle weakness (generalized): Secondary | ICD-10-CM | POA: Diagnosis not present

## 2023-07-04 DIAGNOSIS — Z9181 History of falling: Secondary | ICD-10-CM | POA: Diagnosis not present

## 2023-07-06 ENCOUNTER — Encounter: Payer: Self-pay | Admitting: Family Medicine

## 2023-07-06 ENCOUNTER — Ambulatory Visit (INDEPENDENT_AMBULATORY_CARE_PROVIDER_SITE_OTHER): Payer: Medicare HMO | Admitting: Family Medicine

## 2023-07-06 VITALS — BP 110/68 | HR 91 | Temp 98.3°F

## 2023-07-06 DIAGNOSIS — I4891 Unspecified atrial fibrillation: Secondary | ICD-10-CM | POA: Diagnosis not present

## 2023-07-06 DIAGNOSIS — R279 Unspecified lack of coordination: Secondary | ICD-10-CM | POA: Diagnosis not present

## 2023-07-06 DIAGNOSIS — K5903 Drug induced constipation: Secondary | ICD-10-CM

## 2023-07-06 DIAGNOSIS — R42 Dizziness and giddiness: Secondary | ICD-10-CM

## 2023-07-06 DIAGNOSIS — M6281 Muscle weakness (generalized): Secondary | ICD-10-CM | POA: Diagnosis not present

## 2023-07-06 DIAGNOSIS — I10 Essential (primary) hypertension: Secondary | ICD-10-CM | POA: Diagnosis not present

## 2023-07-06 DIAGNOSIS — Z9181 History of falling: Secondary | ICD-10-CM | POA: Diagnosis not present

## 2023-07-06 MED ORDER — BISACODYL 10 MG RE SUPP
10.0000 mg | RECTAL | 0 refills | Status: DC | PRN
Start: 2023-07-06 — End: 2023-07-06

## 2023-07-06 MED ORDER — MECLIZINE HCL 25 MG PO TABS: 25.0000 mg | ORAL_TABLET | Freq: Three times a day (TID) | ORAL | 1 refills | Status: DC | PRN

## 2023-07-06 MED ORDER — BISACODYL 10 MG RE SUPP
10.0000 mg | RECTAL | 0 refills | Status: DC | PRN
Start: 2023-07-06 — End: 2024-06-12

## 2023-07-06 NOTE — Progress Notes (Signed)
Subjective:  Patient ID: Norma Barajas, female    DOB: [birth year], 82 y.o.   MRN: [id number]  Patient Care Team: Sonny Masters, FNP as PCP - General (Family Medicine) Rollene Rotunda, MD as PCP - Cardiology (Cardiology) West Bali, MD (Inactive) as Attending Physician (Gastroenterology) Derryl Harbor, OD as Consulting Physician (Optometry) Newman Nip, NP (Inactive) as Consulting Physician (Cardiology)   Chief Complaint:  Hospitalization Follow-up (07/01/2023 - 07/02/2023 (27 hours)/[facility]- vertigo. States that she is still having dizzy spells.) and Constipation   HPI: Norma Barajas is a 82 y.o. female presenting on 07/06/2023 for Hospitalization Follow-up (07/01/2023 - 07/02/2023 (27 hours)/[facility]- vertigo. States that she is still having dizzy spells.) and Constipation   1. Dizziness 2. Vertigo Was admitted for vertigo and dizziness. She has not been taking her Meclizine on a regular basis and states when she needs the medications it takes a long time for them to get the medication to her. She has not been eating or drinking well. States she does not like the food at the facility. States she only sips on water throughout the day.   3. Drug induced constipation She has not had the Dulcolax suppositories. States she needs them as she is very constipated, has not had a bowel movement in 2 days and her stomach is bloating and cramping. Has not been hydrating well or eating well.      Relevant past medical, surgical, family, and social history reviewed and updated as indicated.  Allergies and medications reviewed and updated. Data reviewed: Chart in Epic.   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 Nonobstructive minimal CAD  10/2005.   Depression    Diastolic dysfunction    Grade 1. Ejection fraction 60-65%.   Dysrhythmia    a fib   Erosive esophagitis    Essential hypertension    Fracture of ramus of right pubis with routine healing 12/09/2020   GERD (gastroesophageal reflux disease)    Grade III hemorrhoids    History of hiatal hernia    History of prediabetes    Hypercholesterolemia    Internal hemorrhoids with complication 07/29/2008   OCT 2015 FLEX SIG/IH BANDING     Intertrochanteric fracture of femur (HCC)    5/16 - 03/26/2021 fracture sustained in a mechanical fall.  IM nailing performed 5/17 by Dr Thane Edu Warfarin held because of posttraumatic pelvic hematoma.  Postop DVT with 81 mg aspirin twice daily x28 days.   Major depressive disorder, recurrent, moderate (HCC) 12/09/2020   Migraine    "used to have them right bad; I don't now" (04/11/2015)   MITRAL REGURGITATION 04/27/2010   Qualifier: Diagnosis of  By: Eden Emms, MD, Harrington Challenger    Osteoporosis    Paroxysmal atrial fibrillation [facility])    Pelvic hematoma, female 03/27/2021   Sustained in mechanical fall.  Warfarin held.  IM nailing performed 5/17 with postop DVT prophylaxis 81 mg twice daily x28 days.   PONV (postoperative nausea and vomiting)    Vertigo     Past Surgical History:  Procedure Laterality Date   BRAVO [facility] STUDY  11/15/2012   Procedure: BRAVO PH STUDY;  Surgeon: West Bali, MD;  Location: [facility];  Service: Endoscopy;;   CARDIOVERSION N/A 05/16/2020   Procedure: CARDIOVERSION;  Surgeon: Little Ishikawa, MD;  Location: [facility] ENDOSCOPY;  Service: Cardiovascular;  Laterality: N/A;   CATARACT EXTRACTION W/ INTRAOCULAR LENS  IMPLANT, BILATERAL Bilateral    COLONOSCOPY  2008   Dr. Darrick Penna: internal hemorrhoids    DILATION AND CURETTAGE OF UTERUS     ESOPHAGEAL DILATION N/A 06/16/2021   Procedure: ESOPHAGEAL DILATION;  Surgeon: Dolores Frame, MD;  Location: [facility] END[facility];  Service: Gastroenterology;   Laterality: N/A;   ESOPHAGOGASTRODUODENOSCOPY (EGD) WITH ESOPHAGEAL DILATION  2001   Dr. Arlyce Dice: erosive esophagitis, esophageal stricture, duodenitis, s/p Savary dilation   ESOPHAGOGASTRODUODENOSCOPY (EGD) WITH ESOPHAGEAL DILATION  11/15/2012   ZDG:LOVFIEPPIR web was found & MOST LIKELY CAUSE FOR DYAPHAGIA/Polyp was found in the gastric body and gastric fundus/ gastritis on bx   ESOPHAGOGASTRODUODENOSCOPY (EGD) WITH PROPOFOL N/A 06/16/2021   Procedure: ESOPHAGOGASTRODUODENOSCOPY (EGD) WITH PROPOFOL;  Surgeon: Dolores Frame, MD;  Location: [facility];  Service: Gastroenterology;  Laterality: N/A;  8:15   EYE SURGERY Bilateral    "laser OR after cataract OR; cause I couldn't see"   FLEXIBLE SIGMOIDOSCOPY N/A 08/22/2014   mild diverticulosis in sigmoid, moderate sized Grade 3 hemorrhoids s/p banding X 3.    FRACTURE SURGERY Right    below the knee - 2 bones broke has plates   HEMORRHOID BANDING N/A 08/22/2014   Procedure: HEMORRHOID BANDING;  Surgeon: West Bali, MD;  Location: [facility];  Service: Endoscopy;  Laterality: N/A;   HEMORRHOID SURGERY N/A 03/30/2018   Procedure: EXTENSIVE HEMORRHOIDECTOMY;  Surgeon: Lucretia Roers, MD;  Location: [facility] ORS;  Service: General;  Laterality: N/A;   INTRAMEDULLARY (IM) NAIL INTERTROCHANTERIC Right 03/24/2021   Procedure: INTRAMEDULLARY (IM) NAIL INTERTROCHANTRIC;  Surgeon: Oliver Barre, MD;  Location: [facility] ORS;  Service: Orthopedics;  Laterality: Right;   OPEN REDUCTION INTERNAL FIXATION (ORIF) TIBIA/FIBULA FRACTURE Right 2013   broke tibia and fibula after falling down stairs   PROLAPSED UTERINE FIBROID LIGATION  2015   TOTAL ABDOMINAL HYSTERECTOMY      Social History   Socioeconomic History   Marital status: Widowed    Spouse name: george    Number of children: 1   Years of education: Not on file   Highest education level: Not on file  Occupational History   Occupation: Retired    Comment: Textile  Tobacco Use   Smoking  status: Never   Smokeless tobacco: Never  Vaping Use   Vaping status: Never Used  Substance and Sexual Activity   Alcohol use: No    Alcohol/week: 0.0 standard drinks of alcohol   Drug use: No   Sexual activity: Not Currently    Birth control/protection: Surgical, Post-menopausal    Comment: hyst  Other Topics Concern   Not on file  Social History Narrative   Married   No regular exercise   Social Determinants of Health   Financial Resource Strain: Low Risk  (07/04/2023)   Overall Financial Resource Strain (CARDIA)    Difficulty of Paying Living Expenses: Not hard at all  Food Insecurity: No Food Insecurity (07/04/2023)   Hunger Vital Sign    Worried About Running Out of Food in the Last Year: Never true    Ran Out of Food in the Last Year: Never true  Transportation Needs: No Transportation Needs (07/04/2023)   PRAPARE - Transportation    Lack of Transportation (Medical): No    Lack of  Transportation (Non-Medical): No  Physical Activity: Unknown (07/04/2023)   Exercise Vital Sign    Days of Exercise per Week: 0 days    Minutes of Exercise per Session: Not on file  Stress: Stress Concern Present (07/04/2023)   [employer] of Occupational Health - Occupational Stress Questionnaire    Feeling of Stress : To some extent  Social Connections: Moderately Isolated (07/04/2023)   Social Connection and Isolation Panel [NHANES]    Frequency of Communication with Friends and Family: More than three times a week    Frequency of Social Gatherings with Friends and Family: More than three times a week    Attends Religious Services: More than 4 times per year    Active Member of [employer] or Organizations: No    Attends Banker Meetings: Not on file    Marital Status: Widowed  Intimate Partner Violence: Not At Risk (07/01/2023)   Humiliation, Afraid, Rape, and Kick questionnaire    Fear of Current or Ex-Partner: No    Emotionally Abused: No    Physically Abused: No     Sexually Abused: No    Outpatient Encounter Medications as of 07/06/2023  Medication Sig   acetaminophen (TYLENOL) 500 MG tablet Take 2 tablets (1,000 mg total) by mouth every 6 (six) hours as needed for mild pain.   amLODipine (NORVASC) 2.5 MG tablet Take 2.5 mg by mouth daily.   ASPIRIN LOW DOSE 81 MG tablet TAKE 1 TABLET TWICE DAILY   cholecalciferol (VITAMIN D3) 25 MCG (1000 UNIT) tablet Take 1,000 Units by mouth in the morning.   diltiazem (CARDIZEM) 30 MG tablet Take 1 tablet (30 mg total) by mouth every 12 (twelve) hours as needed (For HR >>> 120 BPM).   docusate sodium (COLACE) 100 MG capsule Take 1 capsule (100 mg total) by mouth every morning.   metoprolol (TOPROL XL) 200 MG 24 hr tablet Take 1 tablet (200 mg total) by mouth every evening.   omeprazole (PRILOSEC) 20 MG capsule TAKE 1 CAPSULE EVERY DAY   potassium chloride (KLOR-CON) 10 MEQ tablet Take 1 tablet (10 mEq total) by mouth daily. Take While taking Demadex/Torsemide   simvastatin (ZOCOR) 20 MG tablet Take 1 tablet (20 mg total) by mouth daily at 6 PM.   torsemide (DEMADEX) 20 MG tablet Take 1 tablet (20 mg total) by mouth 2 (two) times daily. Breakfast and lunch   traZODone (DESYREL) 50 MG tablet Take 1 tablet (50 mg total) by mouth at bedtime.   [DISCONTINUED] bisacodyl (DULCOLAX) 10 MG suppository Place 1 suppository (10 mg total) rectally as needed for moderate constipation.   [DISCONTINUED] bisacodyl (DULCOLAX) 10 MG suppository Place 1 suppository (10 mg total) rectally as needed for moderate constipation.   [DISCONTINUED] meclizine (ANTIVERT) 25 MG tablet Take 1 tablet (25 mg total) by mouth 3 (three) times daily as needed for dizziness.   bisacodyl (DULCOLAX) 10 MG suppository Place 1 suppository (10 mg total) rectally as needed for moderate constipation.   meclizine (ANTIVERT) 25 MG tablet Take 1 tablet (25 mg total) by mouth 3 (three) times daily as needed for dizziness.   No facility-administered encounter  medications on file as of 07/06/2023.    Allergies  Allergen Reactions   Codeine Nausea And Vomiting   Doxycycline Other (See Comments)    Chest congestion   Triamterene-Hctz Other (See Comments)    weakness   Atorvastatin Other (See Comments)    Myalgias    Crestor [Rosuvastatin Calcium] Other (See Comments)  weakness   Morphine Nausea Only   Risedronate Sodium Other (See Comments)    ACTONEL - reflux    Review of Systems  Constitutional:  Positive for activity change and appetite change. Negative for chills, diaphoresis, fatigue, fever and unexpected weight change.  HENT: Negative.    Eyes: Negative.  Negative for photophobia and visual disturbance.  Respiratory:  Negative for cough, chest tightness and shortness of breath.   Cardiovascular:  Negative for chest pain, palpitations and leg swelling.  Gastrointestinal:  Positive for abdominal pain and constipation. Negative for abdominal distention, anal bleeding, blood in stool, diarrhea, nausea, rectal pain and vomiting.  Endocrine: Negative.   Genitourinary:  Negative for decreased urine volume, difficulty urinating, dysuria, frequency and urgency.  Musculoskeletal:  Negative for arthralgias and myalgias.  Skin: Negative.   Allergic/Immunologic: Negative.   Neurological:  Positive for dizziness and light-headedness. Negative for tremors, seizures, syncope, facial asymmetry, speech difficulty, weakness, numbness and headaches.  Hematological: Negative.   Psychiatric/Behavioral:  Negative for confusion, hallucinations, sleep disturbance and suicidal ideas.   All other systems reviewed and are negative.       Objective:  BP 110/68   Pulse 91   Temp 98.3 F (36.8 C) (Temporal)   SpO2 93%    Wt Readings from Last 3 Encounters:  07/02/23 112 lb 3.4 oz (50.9 kg)  06/22/23 110 lb (49.9 kg)  06/02/23 109 lb (49.4 kg)    Physical Exam Vitals and nursing note reviewed.  Constitutional:      General: She is not in acute  distress.    Appearance: Normal appearance. She is ill-appearing (chronically ill). She is not toxic-appearing or diaphoretic.  HENT:     Head: Normocephalic and atraumatic.     Nose: Nose normal.     Mouth/Throat:     Mouth: Mucous membranes are moist.  Eyes:     Conjunctiva/sclera: Conjunctivae normal.     Pupils: Pupils are equal, round, and reactive to light.  Cardiovascular:     Rate and Rhythm: Normal rate. Rhythm irregularly irregular.     Heart sounds: Murmur heard.     Systolic murmur is present with a grade of 3/6.  Pulmonary:     Effort: Pulmonary effort is normal.     Breath sounds: Normal breath sounds.  Abdominal:     General: Bowel sounds are normal.     Palpations: Abdomen is soft.     Tenderness: There is no abdominal tenderness.  Musculoskeletal:     Right lower leg: No edema.     Left lower leg: No edema.  Skin:    General: Skin is warm and dry.     Capillary Refill: Capillary refill takes less than 2 seconds.  Neurological:     General: No focal deficit present.     Mental Status: She is alert and oriented to person, place, and time.     Gait: Gait abnormal (using rolling walker).  Psychiatric:        Mood and Affect: Mood normal.        Behavior: Behavior normal.        Thought Content: Thought content normal.        Judgment: Judgment normal.     Results for orders placed or performed during the hospital encounter of 07/01/23  Basic metabolic panel  Result Value Ref Range   Sodium 135 135 - 145 mmol/L   Potassium 3.5 3.5 - 5.1 mmol/L   Chloride 94 (L) 98 - 111 mmol/L  CO2 31 22 - 32 mmol/L   Glucose, Bld 126 (H) 70 - 99 mg/dL   BUN 24 (H) 8 - 23 mg/dL   Creatinine, Ser 9.14 0.44 - 1.00 mg/dL   Calcium 8.6 (L) 8.9 - 10.3 mg/dL   GFR, Estimated 58 (L) >60 mL/min   Anion gap 10 5 - 15  CBC with Differential  Result Value Ref Range   WBC 9.3 4.0 - 10.5 K/uL   RBC 4.48 3.87 - 5.11 MIL/uL   Hemoglobin 14.5 12.0 - 15.0 g/dL   HCT 78.2 95.6 -  21.3 %   MCV 98.2 80.0 - 100.0 fL   MCH 32.4 26.0 - 34.0 pg   MCHC 33.0 30.0 - 36.0 g/dL   RDW 08.6 57.8 - 46.9 %   Platelets 141 (L) 150 - 400 K/uL   nRBC 0.0 0.0 - 0.2 %   Neutrophils Relative % 81 %   Neutro Abs 7.6 1.7 - 7.7 K/uL   Lymphocytes Relative 9 %   Lymphs Abs 0.8 0.7 - 4.0 K/uL   Monocytes Relative 8 %   Monocytes Absolute 0.7 0.1 - 1.0 K/uL   Eosinophils Relative 1 %   Eosinophils Absolute 0.1 0.0 - 0.5 K/uL   Basophils Relative 1 %   Basophils Absolute 0.1 0.0 - 0.1 K/uL   Immature Granulocytes 0 %   Abs Immature Granulocytes 0.03 0.00 - 0.07 K/uL  Magnesium  Result Value Ref Range   Magnesium 2.1 1.7 - 2.4 mg/dL  Urinalysis, Routine w reflex microscopic -Urine, Clean Catch  Result Value Ref Range   Color, Urine YELLOW YELLOW   APPearance CLEAR CLEAR   Specific Gravity, Urine 1.009 1.005 - 1.030   pH 8.0 5.0 - 8.0   Glucose, UA NEGATIVE NEGATIVE mg/dL   Hgb urine dipstick NEGATIVE NEGATIVE   Bilirubin Urine NEGATIVE NEGATIVE   Ketones, ur NEGATIVE NEGATIVE mg/dL   Protein, ur NEGATIVE NEGATIVE mg/dL   Nitrite NEGATIVE NEGATIVE   Leukocytes,Ua NEGATIVE NEGATIVE  Vitamin B12  Result Value Ref Range   Vitamin B-12 310 180 - 914 pg/mL  TSH  Result Value Ref Range   TSH 1.034 0.350 - 4.500 uIU/mL  Phosphorus  Result Value Ref Range   Phosphorus 3.6 2.5 - 4.6 mg/dL  Basic metabolic panel  Result Value Ref Range   Sodium 139 135 - 145 mmol/L   Potassium 3.0 (L) 3.5 - 5.1 mmol/L   Chloride 94 (L) 98 - 111 mmol/L   CO2 33 (H) 22 - 32 mmol/L   Glucose, Bld 94 70 - 99 mg/dL   BUN 16 8 - 23 mg/dL   Creatinine, Ser 6.29 0.44 - 1.00 mg/dL   Calcium 8.8 (L) 8.9 - 10.3 mg/dL   GFR, Estimated >52 >84 mL/min   Anion gap 12 5 - 15  Troponin I (High Sensitivity)  Result Value Ref Range   Troponin I (High Sensitivity) 4 <18 ng/L  Troponin I (High Sensitivity)  Result Value Ref Range   Troponin I (High Sensitivity) 4 <18 ng/L       Pertinent labs & imaging  results that were available during my care of the patient were reviewed by me and considered in my medical decision making.  Assessment & Plan:  Aren was seen today for hospitalization follow-up and constipation.  Diagnoses and all orders for this visit:  Dizziness Vertigo Aware to take Meclizine on a regular basis for the next 5 days and then as needed. Will repeat labs today.  -  CBC with Differential/Platelet -     BMP8+EGFR  Drug induced constipation Restart below. Adequate water and fiber intake discussed in detail.  -     bisacodyl (DULCOLAX) 10 MG suppository; Place 1 suppository (10 mg total) rectally as needed for moderate constipation.     Continue all other maintenance medications.  Follow up plan: Return if symptoms worsen or fail to improve.   Continue healthy lifestyle choices, including diet (rich in fruits, vegetables, and lean proteins, and low in salt and simple carbohydrates) and exercise (at least 30 minutes of moderate physical activity daily).  Educational handout given for vertigo  The above assessment and management plan was discussed with the patient. The patient verbalized understanding of and has agreed to the management plan. Patient is aware to call the clinic if they develop any new symptoms or if symptoms persist or worsen. Patient is aware when to return to the clinic for a follow-up visit. Patient educated on when it is appropriate to go to the emergency department.   Kari Baars, FNP-C [facility] [phone]

## 2023-07-07 LAB — CBC WITH DIFFERENTIAL/PLATELET
Basophils Absolute: 0.1 10*3/uL (ref 0.0–0.2)
Basos: 1 %
EOS (ABSOLUTE): 0.1 10*3/uL (ref 0.0–0.4)
Eos: 1 %
Hematocrit: 45.5 % (ref 34.0–46.6)
Hemoglobin: 15.1 g/dL (ref 11.1–15.9)
Immature Grans (Abs): 0 10*3/uL (ref 0.0–0.1)
Immature Granulocytes: 0 %
Lymphocytes Absolute: 1 10*3/uL (ref 0.7–3.1)
Lymphs: 10 %
MCH: 32.5 pg (ref 26.6–33.0)
MCHC: 33.2 g/dL (ref 31.5–35.7)
MCV: 98 fL — ABNORMAL HIGH (ref 79–97)
Monocytes Absolute: 0.6 10*3/uL (ref 0.1–0.9)
Monocytes: 7 %
Neutrophils Absolute: 7.8 10*3/uL — ABNORMAL HIGH (ref 1.4–7.0)
Neutrophils: 81 %
Platelets: 158 10*3/uL (ref 150–450)
RBC: 4.65 x10E6/uL (ref 3.77–5.28)
RDW: 12.1 % (ref 11.7–15.4)
WBC: 9.6 10*3/uL (ref 3.4–10.8)

## 2023-07-07 LAB — BMP8+EGFR
BUN/Creatinine Ratio: 25 (ref 12–28)
BUN: 27 mg/dL (ref 8–27)
CO2: 28 mmol/L (ref 20–29)
Calcium: 9 mg/dL (ref 8.7–10.3)
Chloride: 94 mmol/L — ABNORMAL LOW (ref 96–106)
Creatinine, Ser: 1.07 mg/dL — ABNORMAL HIGH (ref 0.57–1.00)
Glucose: 129 mg/dL — ABNORMAL HIGH (ref 70–99)
Potassium: 4.1 mmol/L (ref 3.5–5.2)
Sodium: 140 mmol/L (ref 134–144)
eGFR: 51 mL/min/{1.73_m2} — ABNORMAL LOW (ref >59)

## 2023-07-10 ENCOUNTER — Other Ambulatory Visit: Payer: Self-pay | Admitting: Family Medicine

## 2023-07-10 DIAGNOSIS — I5032 Chronic diastolic (congestive) heart failure: Secondary | ICD-10-CM

## 2023-07-11 DIAGNOSIS — Z9181 History of falling: Secondary | ICD-10-CM | POA: Diagnosis not present

## 2023-07-11 DIAGNOSIS — R279 Unspecified lack of coordination: Secondary | ICD-10-CM | POA: Diagnosis not present

## 2023-07-11 DIAGNOSIS — I10 Essential (primary) hypertension: Secondary | ICD-10-CM | POA: Diagnosis not present

## 2023-07-11 DIAGNOSIS — I4891 Unspecified atrial fibrillation: Secondary | ICD-10-CM | POA: Diagnosis not present

## 2023-07-11 DIAGNOSIS — M6281 Muscle weakness (generalized): Secondary | ICD-10-CM | POA: Diagnosis not present

## 2023-07-13 DIAGNOSIS — M6281 Muscle weakness (generalized): Secondary | ICD-10-CM | POA: Diagnosis not present

## 2023-07-13 DIAGNOSIS — R279 Unspecified lack of coordination: Secondary | ICD-10-CM | POA: Diagnosis not present

## 2023-07-13 DIAGNOSIS — I10 Essential (primary) hypertension: Secondary | ICD-10-CM | POA: Diagnosis not present

## 2023-07-13 DIAGNOSIS — Z9181 History of falling: Secondary | ICD-10-CM | POA: Diagnosis not present

## 2023-07-13 DIAGNOSIS — I4891 Unspecified atrial fibrillation: Secondary | ICD-10-CM | POA: Diagnosis not present

## 2023-07-18 DIAGNOSIS — I10 Essential (primary) hypertension: Secondary | ICD-10-CM | POA: Diagnosis not present

## 2023-07-18 DIAGNOSIS — R279 Unspecified lack of coordination: Secondary | ICD-10-CM | POA: Diagnosis not present

## 2023-07-18 DIAGNOSIS — Z9181 History of falling: Secondary | ICD-10-CM | POA: Diagnosis not present

## 2023-07-18 DIAGNOSIS — I4891 Unspecified atrial fibrillation: Secondary | ICD-10-CM | POA: Diagnosis not present

## 2023-07-18 DIAGNOSIS — M6281 Muscle weakness (generalized): Secondary | ICD-10-CM | POA: Diagnosis not present

## 2023-07-20 DIAGNOSIS — I4891 Unspecified atrial fibrillation: Secondary | ICD-10-CM | POA: Diagnosis not present

## 2023-07-20 DIAGNOSIS — M6281 Muscle weakness (generalized): Secondary | ICD-10-CM | POA: Diagnosis not present

## 2023-07-20 DIAGNOSIS — R279 Unspecified lack of coordination: Secondary | ICD-10-CM | POA: Diagnosis not present

## 2023-07-20 DIAGNOSIS — I10 Essential (primary) hypertension: Secondary | ICD-10-CM | POA: Diagnosis not present

## 2023-07-20 DIAGNOSIS — Z9181 History of falling: Secondary | ICD-10-CM | POA: Diagnosis not present

## 2023-07-21 ENCOUNTER — Ambulatory Visit: Payer: Medicare HMO | Admitting: Family Medicine

## 2023-07-25 ENCOUNTER — Other Ambulatory Visit: Payer: Self-pay | Admitting: Family Medicine

## 2023-07-25 DIAGNOSIS — K219 Gastro-esophageal reflux disease without esophagitis: Secondary | ICD-10-CM

## 2023-07-27 ENCOUNTER — Other Ambulatory Visit: Payer: Self-pay | Admitting: Family Medicine

## 2023-07-31 ENCOUNTER — Other Ambulatory Visit: Payer: Self-pay | Admitting: Family Medicine

## 2023-08-01 DIAGNOSIS — I10 Essential (primary) hypertension: Secondary | ICD-10-CM | POA: Diagnosis not present

## 2023-08-01 DIAGNOSIS — M6281 Muscle weakness (generalized): Secondary | ICD-10-CM | POA: Diagnosis not present

## 2023-08-01 DIAGNOSIS — R279 Unspecified lack of coordination: Secondary | ICD-10-CM | POA: Diagnosis not present

## 2023-08-01 DIAGNOSIS — I4891 Unspecified atrial fibrillation: Secondary | ICD-10-CM | POA: Diagnosis not present

## 2023-08-01 DIAGNOSIS — Z9181 History of falling: Secondary | ICD-10-CM | POA: Diagnosis not present

## 2023-08-05 ENCOUNTER — Inpatient Hospital Stay (HOSPITAL_COMMUNITY)
Admission: EM | Admit: 2023-08-05 | Discharge: 2023-08-10 | DRG: 535 | Disposition: A | Discharge status: Skilled Nursing Facility | Payer: Medicare HMO | Source: Skilled Nursing Facility | Attending: Student | Admitting: Student

## 2023-08-05 ENCOUNTER — Emergency Department (HOSPITAL_COMMUNITY): Payer: Medicare HMO

## 2023-08-05 ENCOUNTER — Other Ambulatory Visit: Payer: Self-pay

## 2023-08-05 ENCOUNTER — Encounter (HOSPITAL_COMMUNITY): Payer: Self-pay

## 2023-08-05 ENCOUNTER — Other Ambulatory Visit: Payer: Self-pay | Admitting: Family Medicine

## 2023-08-05 DIAGNOSIS — R42 Dizziness and giddiness: Secondary | ICD-10-CM | POA: Diagnosis not present

## 2023-08-05 DIAGNOSIS — I10 Essential (primary) hypertension: Secondary | ICD-10-CM | POA: Diagnosis not present

## 2023-08-05 DIAGNOSIS — I4821 Permanent atrial fibrillation: Secondary | ICD-10-CM | POA: Diagnosis present

## 2023-08-05 DIAGNOSIS — J9811 Atelectasis: Secondary | ICD-10-CM | POA: Diagnosis present

## 2023-08-05 DIAGNOSIS — I480 Paroxysmal atrial fibrillation: Secondary | ICD-10-CM | POA: Diagnosis not present

## 2023-08-05 DIAGNOSIS — I5082 Biventricular heart failure: Secondary | ICD-10-CM | POA: Diagnosis not present

## 2023-08-05 DIAGNOSIS — S329XXA Fracture of unspecified parts of lumbosacral spine and pelvis, initial encounter for closed fracture: Principal | ICD-10-CM | POA: Diagnosis present

## 2023-08-05 DIAGNOSIS — Z8261 Family history of arthritis: Secondary | ICD-10-CM

## 2023-08-05 DIAGNOSIS — S32512A Fracture of superior rim of left pubis, initial encounter for closed fracture: Secondary | ICD-10-CM | POA: Diagnosis not present

## 2023-08-05 DIAGNOSIS — Z881 Allergy status to other antibiotic agents status: Secondary | ICD-10-CM

## 2023-08-05 DIAGNOSIS — Z888 Allergy status to other drugs, medicaments and biological substances status: Secondary | ICD-10-CM

## 2023-08-05 DIAGNOSIS — I110 Hypertensive heart disease with heart failure: Secondary | ICD-10-CM | POA: Diagnosis not present

## 2023-08-05 DIAGNOSIS — Z885 Allergy status to narcotic agent status: Secondary | ICD-10-CM

## 2023-08-05 DIAGNOSIS — Z800 Family history of malignant neoplasm of digestive organs: Secondary | ICD-10-CM | POA: Diagnosis not present

## 2023-08-05 DIAGNOSIS — E7800 Pure hypercholesterolemia, unspecified: Secondary | ICD-10-CM | POA: Diagnosis present

## 2023-08-05 DIAGNOSIS — I079 Rheumatic tricuspid valve disease, unspecified: Secondary | ICD-10-CM | POA: Diagnosis present

## 2023-08-05 DIAGNOSIS — R54 Age-related physical debility: Secondary | ICD-10-CM | POA: Diagnosis present

## 2023-08-05 DIAGNOSIS — S3210XA Unspecified fracture of sacrum, initial encounter for closed fracture: Secondary | ICD-10-CM | POA: Diagnosis not present

## 2023-08-05 DIAGNOSIS — R296 Repeated falls: Secondary | ICD-10-CM | POA: Diagnosis present

## 2023-08-05 DIAGNOSIS — Z6824 Body mass index (BMI) 24.0-24.9, adult: Secondary | ICD-10-CM

## 2023-08-05 DIAGNOSIS — S32592A Other specified fracture of left pubis, initial encounter for closed fracture: Secondary | ICD-10-CM | POA: Diagnosis not present

## 2023-08-05 DIAGNOSIS — Z90710 Acquired absence of both cervix and uterus: Secondary | ICD-10-CM

## 2023-08-05 DIAGNOSIS — S32502A Unspecified fracture of left pubis, initial encounter for closed fracture: Secondary | ICD-10-CM

## 2023-08-05 DIAGNOSIS — Y92129 Unspecified place in nursing home as the place of occurrence of the external cause: Secondary | ICD-10-CM | POA: Diagnosis not present

## 2023-08-05 DIAGNOSIS — R0902 Hypoxemia: Secondary | ICD-10-CM | POA: Diagnosis not present

## 2023-08-05 DIAGNOSIS — N1832 Chronic kidney disease, stage 3b: Secondary | ICD-10-CM | POA: Diagnosis not present

## 2023-08-05 DIAGNOSIS — I959 Hypotension, unspecified: Secondary | ICD-10-CM | POA: Diagnosis present

## 2023-08-05 DIAGNOSIS — Z823 Family history of stroke: Secondary | ICD-10-CM

## 2023-08-05 DIAGNOSIS — Z833 Family history of diabetes mellitus: Secondary | ICD-10-CM

## 2023-08-05 DIAGNOSIS — I5032 Chronic diastolic (congestive) heart failure: Secondary | ICD-10-CM | POA: Diagnosis present

## 2023-08-05 DIAGNOSIS — I2510 Atherosclerotic heart disease of native coronary artery without angina pectoris: Secondary | ICD-10-CM | POA: Diagnosis present

## 2023-08-05 DIAGNOSIS — I4891 Unspecified atrial fibrillation: Secondary | ICD-10-CM | POA: Diagnosis present

## 2023-08-05 DIAGNOSIS — Z79899 Other long term (current) drug therapy: Secondary | ICD-10-CM

## 2023-08-05 DIAGNOSIS — K219 Gastro-esophageal reflux disease without esophagitis: Secondary | ICD-10-CM | POA: Diagnosis present

## 2023-08-05 DIAGNOSIS — I4811 Longstanding persistent atrial fibrillation: Principal | ICD-10-CM

## 2023-08-05 DIAGNOSIS — R0602 Shortness of breath: Secondary | ICD-10-CM | POA: Diagnosis not present

## 2023-08-05 DIAGNOSIS — M810 Age-related osteoporosis without current pathological fracture: Secondary | ICD-10-CM | POA: Diagnosis present

## 2023-08-05 DIAGNOSIS — Z8719 Personal history of other diseases of the digestive system: Secondary | ICD-10-CM

## 2023-08-05 DIAGNOSIS — I130 Hypertensive heart and chronic kidney disease with heart failure and stage 1 through stage 4 chronic kidney disease, or unspecified chronic kidney disease: Secondary | ICD-10-CM | POA: Diagnosis present

## 2023-08-05 DIAGNOSIS — E876 Hypokalemia: Secondary | ICD-10-CM | POA: Diagnosis not present

## 2023-08-05 DIAGNOSIS — D696 Thrombocytopenia, unspecified: Secondary | ICD-10-CM | POA: Diagnosis not present

## 2023-08-05 DIAGNOSIS — Z8249 Family history of ischemic heart disease and other diseases of the circulatory system: Secondary | ICD-10-CM | POA: Diagnosis not present

## 2023-08-05 DIAGNOSIS — S3282XA Multiple fractures of pelvis without disruption of pelvic ring, initial encounter for closed fracture: Secondary | ICD-10-CM | POA: Diagnosis not present

## 2023-08-05 DIAGNOSIS — Z8049 Family history of malignant neoplasm of other genital organs: Secondary | ICD-10-CM | POA: Diagnosis not present

## 2023-08-05 DIAGNOSIS — S3282XD Multiple fractures of pelvis without disruption of pelvic ring, subsequent encounter for fracture with routine healing: Secondary | ICD-10-CM | POA: Diagnosis not present

## 2023-08-05 DIAGNOSIS — M40204 Unspecified kyphosis, thoracic region: Secondary | ICD-10-CM | POA: Diagnosis not present

## 2023-08-05 DIAGNOSIS — I517 Cardiomegaly: Secondary | ICD-10-CM | POA: Diagnosis not present

## 2023-08-05 DIAGNOSIS — E782 Mixed hyperlipidemia: Secondary | ICD-10-CM | POA: Diagnosis not present

## 2023-08-05 DIAGNOSIS — I361 Nonrheumatic tricuspid (valve) insufficiency: Secondary | ICD-10-CM | POA: Diagnosis not present

## 2023-08-05 DIAGNOSIS — F411 Generalized anxiety disorder: Secondary | ICD-10-CM | POA: Diagnosis not present

## 2023-08-05 DIAGNOSIS — M25552 Pain in left hip: Secondary | ICD-10-CM | POA: Diagnosis not present

## 2023-08-05 DIAGNOSIS — J9601 Acute respiratory failure with hypoxia: Secondary | ICD-10-CM | POA: Diagnosis not present

## 2023-08-05 DIAGNOSIS — Z8262 Family history of osteoporosis: Secondary | ICD-10-CM

## 2023-08-05 DIAGNOSIS — Z9181 History of falling: Secondary | ICD-10-CM

## 2023-08-05 DIAGNOSIS — D519 Vitamin B12 deficiency anemia, unspecified: Secondary | ICD-10-CM | POA: Diagnosis not present

## 2023-08-05 DIAGNOSIS — M25551 Pain in right hip: Secondary | ICD-10-CM | POA: Diagnosis not present

## 2023-08-05 DIAGNOSIS — W19XXXA Unspecified fall, initial encounter: Secondary | ICD-10-CM | POA: Diagnosis present

## 2023-08-05 DIAGNOSIS — Z66 Do not resuscitate: Secondary | ICD-10-CM | POA: Diagnosis not present

## 2023-08-05 DIAGNOSIS — R918 Other nonspecific abnormal finding of lung field: Secondary | ICD-10-CM | POA: Diagnosis not present

## 2023-08-05 DIAGNOSIS — I700 Atherosclerosis of aorta: Secondary | ICD-10-CM | POA: Diagnosis not present

## 2023-08-05 DIAGNOSIS — Z83438 Family history of other disorder of lipoprotein metabolism and other lipidemia: Secondary | ICD-10-CM

## 2023-08-05 LAB — CBC
HCT: 48.4 % — ABNORMAL HIGH (ref 36.0–46.0)
Hemoglobin: 16 g/dL — ABNORMAL HIGH (ref 12.0–15.0)
MCH: 32.1 pg (ref 26.0–34.0)
MCHC: 33.1 g/dL (ref 30.0–36.0)
MCV: 97 fL (ref 80.0–100.0)
Platelets: 130 10*3/uL — ABNORMAL LOW (ref 150–400)
RBC: 4.99 MIL/uL (ref 3.87–5.11)
RDW: 13.2 % (ref 11.5–15.5)
WBC: 9.4 10*3/uL (ref 4.0–10.5)
nRBC: 0 % (ref 0.0–0.2)

## 2023-08-05 LAB — BASIC METABOLIC PANEL
Anion gap: 14 (ref 5–15)
BUN: 27 mg/dL — ABNORMAL HIGH (ref 8–23)
CO2: 27 mmol/L (ref 22–32)
Calcium: 8.8 mg/dL — ABNORMAL LOW (ref 8.9–10.3)
Chloride: 94 mmol/L — ABNORMAL LOW (ref 98–111)
Creatinine, Ser: 1.16 mg/dL — ABNORMAL HIGH (ref 0.44–1.00)
GFR, Estimated: 46 mL/min — ABNORMAL LOW (ref >60)
Glucose, Bld: 159 mg/dL — ABNORMAL HIGH (ref 70–99)
Potassium: 3.8 mmol/L (ref 3.5–5.1)
Sodium: 135 mmol/L (ref 135–145)

## 2023-08-05 LAB — MRSA NEXT GEN BY PCR, NASAL: MRSA by PCR Next Gen: NOT DETECTED

## 2023-08-05 LAB — MAGNESIUM: Magnesium: 2.2 mg/dL (ref 1.7–2.4)

## 2023-08-05 MED ORDER — ACETAMINOPHEN 650 MG RE SUPP: 650.0000 mg | Freq: Four times a day (QID) | RECTAL | Status: DC | PRN

## 2023-08-05 MED ORDER — METOPROLOL SUCCINATE ER 50 MG PO TB24
100.0000 mg | ORAL_TABLET | Freq: Every evening | ORAL | Status: DC
  Administered 2023-08-06: 100 mg via ORAL
  Filled 2023-08-05: qty 2

## 2023-08-05 MED ORDER — LACTATED RINGERS IV BOLUS
500.0000 mL | Freq: Once | INTRAVENOUS | Status: AC
  Administered 2023-08-05: 500 mL via INTRAVENOUS

## 2023-08-05 MED ORDER — ALBUTEROL SULFATE (2.5 MG/3ML) 0.083% IN NEBU: 2.5000 mg | INHALATION_SOLUTION | RESPIRATORY_TRACT | Status: DC | PRN

## 2023-08-05 MED ORDER — HYDROCODONE-ACETAMINOPHEN 5-325 MG PO TABS
1.0000 | ORAL_TABLET | ORAL | Status: DC | PRN
  Administered 2023-08-06: 2 via ORAL
  Administered 2023-08-06: 1 via ORAL
  Filled 2023-08-05: qty 1
  Filled 2023-08-05: qty 2

## 2023-08-05 MED ORDER — DIGOXIN 0.25 MG/ML IJ SOLN
0.2500 mg | Freq: Once | INTRAMUSCULAR | Status: AC
  Administered 2023-08-05: 0.25 mg via INTRAVENOUS
  Filled 2023-08-05: qty 2

## 2023-08-05 MED ORDER — CYANOCOBALAMIN 1000 MCG/ML IJ SOLN
1000.0000 ug | Freq: Every day | INTRAMUSCULAR | Status: DC
  Administered 2023-08-05 – 2023-08-10 (×5): 1000 ug via SUBCUTANEOUS
  Filled 2023-08-05 (×6): qty 1

## 2023-08-05 MED ORDER — CHLORHEXIDINE GLUCONATE CLOTH 2 % EX PADS
6.0000 | MEDICATED_PAD | Freq: Every day | CUTANEOUS | Status: DC
  Administered 2023-08-06 – 2023-08-09 (×4): 6 via TOPICAL

## 2023-08-05 MED ORDER — METHOCARBAMOL 1000 MG/10ML IJ SOLN: 500.0000 mg | Freq: Four times a day (QID) | INTRAVENOUS | Status: DC | PRN

## 2023-08-05 MED ORDER — ACETAMINOPHEN 325 MG PO TABS: 650.0000 mg | ORAL_TABLET | Freq: Four times a day (QID) | ORAL | Status: DC | PRN

## 2023-08-05 MED ORDER — ASPIRIN 81 MG PO TBEC
81.0000 mg | DELAYED_RELEASE_TABLET | Freq: Two times a day (BID) | ORAL | Status: DC
  Administered 2023-08-05 – 2023-08-10 (×10): 81 mg via ORAL
  Filled 2023-08-05 (×10): qty 1

## 2023-08-05 MED ORDER — SODIUM CHLORIDE 0.9 % IV BOLUS
500.0000 mL | Freq: Once | INTRAVENOUS | Status: AC
  Administered 2023-08-05: 500 mL via INTRAVENOUS

## 2023-08-05 MED ORDER — MIDODRINE HCL 5 MG PO TABS
10.0000 mg | ORAL_TABLET | Freq: Two times a day (BID) | ORAL | Status: DC
  Administered 2023-08-05: 10 mg via ORAL
  Filled 2023-08-05: qty 2

## 2023-08-05 MED ORDER — ENOXAPARIN SODIUM 30 MG/0.3ML IJ SOSY
30.0000 mg | PREFILLED_SYRINGE | INTRAMUSCULAR | Status: DC
  Administered 2023-08-06 – 2023-08-07 (×2): 30 mg via SUBCUTANEOUS
  Filled 2023-08-05 (×2): qty 0.3

## 2023-08-05 MED ORDER — POLYETHYLENE GLYCOL 3350 17 G PO PACK: 17.0000 g | PACK | Freq: Every day | ORAL | Status: DC | PRN

## 2023-08-05 MED ORDER — PANTOPRAZOLE SODIUM 40 MG PO TBEC
40.0000 mg | DELAYED_RELEASE_TABLET | Freq: Every day | ORAL | Status: DC
  Administered 2023-08-06 – 2023-08-10 (×5): 40 mg via ORAL
  Filled 2023-08-05 (×5): qty 1

## 2023-08-05 MED ORDER — BISACODYL 10 MG RE SUPP: 10.0000 mg | Freq: Every day | RECTAL | Status: DC | PRN

## 2023-08-05 MED ORDER — ACETAMINOPHEN 325 MG PO TABS
650.0000 mg | ORAL_TABLET | Freq: Once | ORAL | Status: AC
  Administered 2023-08-05: 650 mg via ORAL
  Filled 2023-08-05: qty 2

## 2023-08-05 MED ORDER — SENNA 8.6 MG PO TABS
1.0000 | ORAL_TABLET | Freq: Two times a day (BID) | ORAL | Status: DC
  Administered 2023-08-05 – 2023-08-07 (×4): 8.6 mg via ORAL
  Filled 2023-08-05 (×4): qty 1

## 2023-08-05 MED ORDER — KETOROLAC TROMETHAMINE 15 MG/ML IJ SOLN
15.0000 mg | Freq: Four times a day (QID) | INTRAMUSCULAR | Status: DC | PRN
  Administered 2023-08-06: 15 mg via INTRAVENOUS
  Filled 2023-08-05: qty 1

## 2023-08-05 MED ORDER — DILTIAZEM HCL 30 MG PO TABS: 30.0000 mg | ORAL_TABLET | Freq: Two times a day (BID) | ORAL | Status: DC | PRN

## 2023-08-05 MED ORDER — HYDRALAZINE HCL 20 MG/ML IJ SOLN: 5.0000 mg | INTRAMUSCULAR | Status: DC | PRN

## 2023-08-05 NOTE — H&P (Signed)
TRH H&P   Patient Demographics:    Norma Barajas, is a 82 y.o. female  MRN: [id number]   DOB - 1941-01-14  Admit Date - 08/05/2023  Outpatient Primary MD for the patient is Rakes, Doralee Albino, FNP  Referring MD/NP/PA: Dr Maple Hudson  Patient coming from: [facility] point [facility]  Chief Complaint  Patient presents with   Dizziness   Fall      HPI:    Norma Barajas  is a 82 y.o. female, with a history of BPPV, chronic atrial fibrillation, hypertension, hyperlipidemia, diastolic CHF, GERD, with recent hospitalization last month secondary to vertigo, thought to be secondary to A-fib with RVR and BPPV, patient lives in [facility], patient sent by facility for a fall evaluation, patient usually ambulates with a walker, she does report vertigo intermittently, falls in the past, but none for a few month, patient reports vertigo and dizziness, both together when she was ambulating with a walker, she on the floor, she denies any loss of consciousness, head trauma, she complains of pain in pelvic area, denies any chest pain, shortness of breath, fever or chills.  . -ED her workup significant for potassium 3.8, creatinine 1.16, magnesium 2.2, hemoglobin of 16, platelet count of 130K, she was in A-fib with RVR, pelvis x-ray significant for left superior and inferior pubic rami fractures, with possible fracture of the left sacrum as well, [facility] consulted to admit.  Review of systems:   A full 10 point Review of Systems was done, except as stated above, all other Review of Systems were negative.   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  Nonobstructive minimal CAD 10/2005.   Depression    Diastolic dysfunction    Grade 1. Ejection fraction 60-65%.   Dysrhythmia    a fib   Erosive esophagitis    Essential hypertension    Fracture of ramus of right pubis with routine healing 12/09/2020   GERD (gastroesophageal reflux disease)    Grade III hemorrhoids    History of hiatal hernia    History of prediabetes    Hypercholesterolemia    Internal hemorrhoids with complication 07/29/2008   OCT 2015 FLEX SIG/IH BANDING     Intertrochanteric fracture of femur (HCC)    5/16 - 03/26/2021 fracture sustained in a mechanical fall.  IM nailing performed 5/17 by Dr Thane Edu Warfarin held because of posttraumatic pelvic hematoma.  Postop DVT with 81 mg aspirin twice daily x28 days.   Major depressive disorder, recurrent, moderate (HCC) 12/09/2020   Migraine    "used to have them  right bad; I don't now" (04/11/2015)   MITRAL REGURGITATION 04/27/2010   Qualifier: Diagnosis of  By: Eden Emms, MD, Harrington Challenger    Osteoporosis    Paroxysmal atrial fibrillation [facility])    Pelvic hematoma, female 03/27/2021   Sustained in mechanical fall.  Warfarin held.  IM nailing performed 5/17 with postop DVT prophylaxis 81 mg twice daily x28 days.   PONV (postoperative nausea and vomiting)    Vertigo       Past Surgical History:  Procedure Laterality Date   BRAVO [facility] STUDY  11/15/2012   Procedure: BRAVO PH STUDY;  Surgeon: West Bali, MD;  Location: [facility] ENDO [facility];  Service: Endoscopy;;   CARDIOVERSION N/A 05/16/2020   Procedure: CARDIOVERSION;  Surgeon: Little Ishikawa, MD;  Location: [facility] ENDOSCOPY;  Service: Cardiovascular;  Laterality: N/A;   CATARACT EXTRACTION W/ INTRAOCULAR LENS  IMPLANT, BILATERAL Bilateral    COLONOSCOPY  2008   Dr. Darrick Penna: internal hemorrhoids    DILATION AND CURETTAGE OF UTERUS     ESOPHAGEAL DILATION N/A 06/16/2021   Procedure: ESOPHAGEAL DILATION;  Surgeon: Dolores Frame, MD;  Location: [facility] ENDO SUITE;   Service: Gastroenterology;  Laterality: N/A;   ESOPHAGOGASTRODUODENOSCOPY (EGD) WITH ESOPHAGEAL DILATION  2001   Dr. Arlyce Dice: erosive esophagitis, esophageal stricture, duodenitis, s/p Savary dilation   ESOPHAGOGASTRODUODENOSCOPY (EGD) WITH ESOPHAGEAL DILATION  11/15/2012   [id number] web was found & MOST LIKELY CAUSE FOR DYAPHAGIA/Polyp was found in the gastric body and gastric fundus/ gastritis on bx   ESOPHAGOGASTRODUODENOSCOPY (EGD) WITH PROPOFOL N/A 06/16/2021   Procedure: ESOPHAGOGASTRODUODENOSCOPY (EGD) WITH PROPOFOL;  Surgeon: Dolores Frame, MD;  Location: [facility];  Service: Gastroenterology;  Laterality: N/A;  8:15   EYE SURGERY Bilateral    "laser OR after cataract OR; cause I couldn't see"   FLEXIBLE SIGMOIDOSCOPY N/A 08/22/2014   mild diverticulosis in sigmoid, moderate sized Grade 3 hemorrhoids s/p banding X 3.    FRACTURE SURGERY Right    below the knee - 2 bones broke has plates   HEMORRHOID BANDING N/A 08/22/2014   Procedure: HEMORRHOID BANDING;  Surgeon: West Bali, MD;  Location: [facility];  Service: Endoscopy;  Laterality: N/A;   HEMORRHOID SURGERY N/A 03/30/2018   Procedure: EXTENSIVE HEMORRHOIDECTOMY;  Surgeon: Lucretia Roers, MD;  Location: [facility] ORS;  Service: General;  Laterality: N/A;   INTRAMEDULLARY (IM) NAIL INTERTROCHANTERIC Right 03/24/2021   Procedure: INTRAMEDULLARY (IM) NAIL INTERTROCHANTRIC;  Surgeon: Oliver Barre, MD;  Location: [facility] ORS;  Service: Orthopedics;  Laterality: Right;   OPEN REDUCTION INTERNAL FIXATION (ORIF) TIBIA/FIBULA FRACTURE Right 2013   broke tibia and fibula after falling down stairs   PROLAPSED UTERINE FIBROID LIGATION  2015   TOTAL ABDOMINAL HYSTERECTOMY        Social History:     Social History   Tobacco Use   Smoking status: Never   Smokeless tobacco: Never  Substance Use Topics   Alcohol use: No    Alcohol/week: 0.0 standard drinks of alcohol      Family History :     Family History   Problem Relation Age of Onset   Colon cancer Mother 6   Heart disease Mother    Osteoporosis Mother    Hip fracture Mother    Stroke Sister    Diabetes Sister    Osteoporosis Sister    Arthritis Sister    Uterine cancer Sister    Stroke Sister    Heart disease Father    Hyperlipidemia Brother  Hypertension Brother    Heart disease Brother    Stroke Brother    Heart disease Sister    Dementia Sister    Diabetes Son    Stroke Son    Heart attack Neg Hx       Home Medications:   Prior to Admission medications   Medication Sig Start Date End Date Taking? Authorizing Provider  acetaminophen (TYLENOL) 500 MG tablet Take 2 tablets (1,000 mg total) by mouth every 6 (six) hours as needed for mild pain. 05/04/23   Sonny Masters, FNP  amLODipine (NORVASC) 2.5 MG tablet Take 2.5 mg by mouth daily.    [provider]  ASPIRIN LOW DOSE 81 MG tablet TAKE 1 TABLET TWICE DAILY 06/23/22   Gwenlyn Fudge, FNP  bisacodyl (DULCOLAX) 10 MG suppository Place 1 suppository (10 mg total) rectally as needed for moderate constipation. 07/06/23   Sonny Masters, FNP  cholecalciferol (VITAMIN D3) 25 MCG (1000 UNIT) tablet Take 1,000 Units by mouth in the morning.    [provider]  diltiazem (CARDIZEM) 30 MG tablet Take 1 tablet (30 mg total) by mouth every 12 (twelve) hours as needed (For HR >>> 120 BPM). 05/19/23   Shon Hale, MD  docusate sodium (COLACE) 100 MG capsule Take 1 capsule (100 mg total) by mouth every morning. 05/04/23   Sonny Masters, FNP  meclizine (ANTIVERT) 25 MG tablet TAKE 1 TABLET THREE TIMES DAILY AS NEEDED FOR DIZZINESS 07/27/23   Sonny Masters, FNP  metoprolol (TOPROL XL) 200 MG 24 hr tablet Take 1 tablet (200 mg total) by mouth every evening. 06/22/23 06/21/24  Sonny Masters, FNP  [facility] powder Apply 1 Application topically 2 (two) times daily. 06/02/23   [provider]  omeprazole (PRILOSEC) 20 MG capsule TAKE 1 CAPSULE EVERY DAY 07/25/23    Rakes, Doralee Albino, FNP  potassium chloride (KLOR-CON) 10 MEQ tablet Take 1 tablet (10 mEq total) by mouth daily. Take While taking Demadex/Torsemide 05/19/23   Shon Hale, MD  simvastatin (ZOCOR) 20 MG tablet TAKE 1 TABLET EVERY DAY 08/01/23   Sonny Masters, FNP  torsemide (DEMADEX) 20 MG tablet Take 1 tablet (20 mg total) by mouth 2 (two) times daily. Breakfast and lunch 06/22/23   Sonny Masters, FNP  traZODone (DESYREL) 50 MG tablet Take 1 tablet (50 mg total) by mouth at bedtime. 05/19/23   Shon Hale, MD     Allergies:     Allergies  Allergen Reactions   Codeine Nausea And Vomiting   Doxycycline Other (See Comments)    Chest congestion   Triamterene-Hctz Other (See Comments)    weakness   Atorvastatin Other (See Comments)    Myalgias    Crestor [Rosuvastatin Calcium] Other (See Comments)    weakness   Morphine Nausea Only   Risedronate Sodium Other (See Comments)    ACTONEL - reflux     Physical Exam:   Vitals  Blood pressure 124/75, pulse 86, temperature 97.7 F (36.5 C), temperature source Oral, resp. rate (!) 25, height 4\' 8"  (1.422 m), weight 51 kg, SpO2 96%.   1. General Elderly female, laying in bed, no apparent distress  2. Normal affect and insight, Not Suicidal or Homicidal, Awake Alert, Oriented X 3.  3. No F.N deficits, ALL C.Nerves Intact, Strength 5/5 all 4 extremities, Sensation intact all 4 extremities, Plantars down going.  4. Ears and Eyes appear Normal, Conjunctivae clear, PERRLA. Moist Oral Mucosa.  5. Supple Neck, No JVD,  No cervical lymphadenopathy appriciated, No Carotid Bruits.  6. Symmetrical Chest wall movement, Good air movement bilaterally, CTAB.  7.  Irregular irregular, tachycardic, No Gallops, Rubs or Murmurs, No Parasternal Heave.  8. Positive Bowel Sounds, Abdomen Soft, No tenderness, bruits/ecchymosis present on left lower abdomen, from previous DVT prophylaxis shot during recent hospitalization.  No organomegaly  appriciated,No rebound -guarding or rigidity.  9.  No Cyanosis, Normal Skin Turgor, No Skin Rash or Bruise.  10. Good muscle tone,  joints appear normal , no effusions, of motion limited in left lower extremity due to pain     Data Review:    CBC Recent Labs  Lab 08/05/23 1233  WBC 9.4  HGB 16.0*  HCT 48.4*  PLT 130*  MCV 97.0  MCH 32.1  MCHC 33.1  RDW 13.2   ------------------------------------------------------------------------------------------------------------------  Chemistries  Recent Labs  Lab 08/05/23 1233 08/05/23 1247  NA 135  --   K 3.8  --   CL 94*  --   CO2 27  --   GLUCOSE 159*  --   BUN 27*  --   CREATININE 1.16*  --   CALCIUM 8.8*  --   MG  --  2.2   ------------------------------------------------------------------------------------------------------------------ estimated creatinine clearance is 24.1 mL/min (A) (by C-G formula based on SCr of 1.16 mg/dL (H)). ------------------------------------------------------------------------------------------------------------------ No results for input(s): "TSH", "T4TOTAL", "T3FREE", "THYROIDAB" in the last 72 hours.  Invalid input(s): "FREET3"  Coagulation profile No results for input(s): "INR", "PROTIME" in the last 168 hours. ------------------------------------------------------------------------------------------------------------------- No results for input(s): "DDIMER" in the last 72 hours. -------------------------------------------------------------------------------------------------------------------  Cardiac Enzymes No results for input(s): "CKMB", "TROPONINI", "MYOGLOBIN" in the last 168 hours.  Invalid input(s): "CK" ------------------------------------------------------------------------------------------------------------------    Component Value Date/Time   BNP 275.8 (H) 04/20/2023 0950   BNP 546.0 (H) 09/10/2020 0442      ---------------------------------------------------------------------------------------------------------------  Urinalysis    Component Value Date/Time   COLORURINE YELLOW 07/01/2023 1244   APPEARANCEUR CLEAR 07/01/2023 1244   APPEARANCEUR Clear 07/31/2020 1545   LABSPEC 1.009 07/01/2023 1244   PHURINE 8.0 07/01/2023 1244   GLUCOSEU NEGATIVE 07/01/2023 1244   HGBUR NEGATIVE 07/01/2023 1244   BILIRUBINUR NEGATIVE 07/01/2023 1244   BILIRUBINUR Negative 07/31/2020 1545   KETONESUR NEGATIVE 07/01/2023 1244   PROTEINUR NEGATIVE 07/01/2023 1244   UROBILINOGEN negative 02/06/2015 1252   NITRITE NEGATIVE 07/01/2023 1244   LEUKOCYTESUR NEGATIVE 07/01/2023 1244    ----------------------------------------------------------------------------------------------------------------   Imaging Results:    DG Pelvis 1-2 Views  Result Date: 08/05/2023 CLINICAL DATA:  Fall.  Bilateral hip pain. EXAM: PELVIS - 1-2 VIEW COMPARISON:  06/23/2021 FINDINGS: Right proximal femoral nail system noted. Likely chronic deformity of the right inferior pubic ramus. Suspected acute fractures the left superior and inferior pubic rami. Such fractures would typically also include a sacral fracture, the sacrum is difficult to characterize due to bony demineralization. Atherosclerosis is present, including aortoiliac atherosclerotic disease. IMPRESSION: 1. Suspected acute fractures of the left superior and inferior pubic rami. Possible fracture of the left sacrum. 2. Chronic deformity of the right inferior pubic ramus. 3. Atherosclerosis. Electronically Signed   By: Gaylyn Rong M.D.   On: 08/05/2023 14:41   DG Chest Portable 1 View  Result Date: 08/05/2023 CLINICAL DATA:  Hypoxia, fall EXAM: PORTABLE CHEST 1 VIEW COMPARISON:  05/19/2023 FINDINGS: Atherosclerotic calcification of the aortic arch. Mild cardiomegaly. The lungs appear clear. Substantial thoracic kyphosis and spondylosis known compression fractures  in the thoracic and lumbar spine better shown on prior imaging. The patient is  rotated to the right on today's radiograph, reducing diagnostic sensitivity and specificity. IMPRESSION: 1. Mild cardiomegaly. 2. Substantial thoracic kyphosis and spondylosis. Known compression fractures in the thoracic and lumbar spine better shown on prior imaging. Electronically Signed   By: Gaylyn Rong M.D.   On: 08/05/2023 14:39   EKG:  Vent. rate 133 BPM PR interval * ms QRS duration 64 ms QT/QTcB 321/513 ms P-R-T axes * 80 3 Atrial fibrillation with rapid V-rate Repolarization abnormality, prob rate related   Assessment & Plan:    Active Problems:   Atrial fibrillation with RVR (HCC)   GAD (generalized anxiety disorder)   GERD   HTN (hypertension)   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)   Afib (HCC)   Vertigo    A-fib with RVR -Patient with known history of A-fib, not on anticoagulation secondary to multiple falls, will continue with aspirin. -Uncontrolled in the 120s to 130s currently, her blood pressure remains soft with systolic in the 90s, so we will hold on resuming home metoprolol and Cardizem for now. -Keep on as needed Cardizem pushes, if needed will do Cardizem drip. -Will do 1 dose of IV digoxin given soft blood pressure, might need to load with further doses if heart rate remains uncontrolled. -Started midodrine for soft blood pressure  Pelvic fracture Fall Hypotension Dizziness/vertigo -With known history of BPPV, she does report vertigo, as well she does report lightheadedness, likely she was orthostatic as well in the setting of again A-fib with RVR, on multiple antihypertensive medication including Norvasc, Cardizem and metoprolol. -Norvasc for now been on hold, will resume back on metoprolol and Cardizem mainly for heart rate control when blood pressure allows -Start PT/OT, likely will need SNF placement -Keep on TED  hose -With pelvic fracture, ED discussed with Dr. Dallas Schimke,  condition for weightbearing as tolerated, and control -Hospitalization with normal MRI, TSH within normal limit, but serum B12 was low at 310, will start on supplement.   Permanent atrial fibrillation -Overall rate controlled -reviewed tele--no prolonged RVR episodes.  No concerning dysrhythmia -Continue metoprolol succinate 200 mg daily -Continue aspirin -Not on anticoagulation secondary to frequent falls -Hx of previous cardioversions, refuses additional DCCV's. Unsure when Amiodarone was stopped. Failed sotalol, reported cost issues with dofetilide.    CKD stage IIIa -At baseline, continue to monitor   Chronic HFpEF -Euvolemic, will monitor closely as she might need some fluid boluses to improve blood pressure -Torsemide for now till blood pressure has improved . Essential hypertension -See above discussion, Norvasc remains on hold, will resume metoprolol and Cardizem for heart rate control 1 blood pressure has improved   Hyperlipidemia -Continue statin   GERD -Continue pantoprazole          DVT Prophylaxis Lovenox   AM Labs Ordered, also please review Full Orders  Family Communication: Admission, patients condition and plan of care including tests being ordered have been discussed with the patient and niece at bedside* who indicate understanding and agree with the plan and Code Status.  Code Status DNR, she has DNR form from the facility  Likely DC to likely she will need SNF, she is currently at [facility]  Condition GUARDED  Consults called: ED discussed with orthopedic  Admission status: Inpatient  Time spent in minutes : 70 minutes   Huey Bienenstock M.D on 08/05/2023 at 3:30 PM   [facility] - Office  [phone]

## 2023-08-05 NOTE — ED Notes (Signed)
Placed pt on 2LPM O2 via [place]

## 2023-08-05 NOTE — ED Notes (Signed)
Warm blankets given 

## 2023-08-05 NOTE — ED Notes (Signed)
Attempted to call report RN needs 15-61mins and will call [phone] back

## 2023-08-05 NOTE — ED Notes (Signed)
Pt on bed pan  Call bell in hand stated she will call when done

## 2023-08-05 NOTE — ED Triage Notes (Signed)
Pt brought by EMS Per EMS Pt has a hx of vertigo, dizziness is not new. Felt dizzy and like she was going to fall and lowered herself to the floor. Pt was found laying on her RIGHT side on the floor.  EMS stated pt was 90% RA, placed on 2LPM O2 and improved to 96%. BGL 120  Pt complains of LEFT hip pain.  Denies hitting head or LOC.  No bruising noted but this RN  No shortening or rotation.  Pt has pain on movement

## 2023-08-06 DIAGNOSIS — S3282XA Multiple fractures of pelvis without disruption of pelvic ring, initial encounter for closed fracture: Secondary | ICD-10-CM | POA: Diagnosis not present

## 2023-08-06 DIAGNOSIS — I10 Essential (primary) hypertension: Secondary | ICD-10-CM

## 2023-08-06 DIAGNOSIS — F411 Generalized anxiety disorder: Secondary | ICD-10-CM | POA: Diagnosis not present

## 2023-08-06 DIAGNOSIS — I4891 Unspecified atrial fibrillation: Secondary | ICD-10-CM | POA: Diagnosis not present

## 2023-08-06 DIAGNOSIS — I5082 Biventricular heart failure: Secondary | ICD-10-CM | POA: Diagnosis not present

## 2023-08-06 DIAGNOSIS — K219 Gastro-esophageal reflux disease without esophagitis: Secondary | ICD-10-CM

## 2023-08-06 LAB — CBC
HCT: 44.2 % (ref 36.0–46.0)
Hemoglobin: 14.7 g/dL (ref 12.0–15.0)
MCH: 32 pg (ref 26.0–34.0)
MCHC: 33.3 g/dL (ref 30.0–36.0)
MCV: 96.3 fL (ref 80.0–100.0)
Platelets: 130 10*3/uL — ABNORMAL LOW (ref 150–400)
RBC: 4.59 MIL/uL (ref 3.87–5.11)
RDW: 13.2 % (ref 11.5–15.5)
WBC: 11 10*3/uL — ABNORMAL HIGH (ref 4.0–10.5)
nRBC: 0 % (ref 0.0–0.2)

## 2023-08-06 LAB — BASIC METABOLIC PANEL
Anion gap: 13 (ref 5–15)
BUN: 22 mg/dL (ref 8–23)
CO2: 30 mmol/L (ref 22–32)
Calcium: 8.7 mg/dL — ABNORMAL LOW (ref 8.9–10.3)
Chloride: 95 mmol/L — ABNORMAL LOW (ref 98–111)
Creatinine, Ser: 0.85 mg/dL (ref 0.44–1.00)
GFR, Estimated: >60 mL/min (ref >60)
Glucose, Bld: 98 mg/dL (ref 70–99)
Potassium: 3.5 mmol/L (ref 3.5–5.1)
Sodium: 138 mmol/L (ref 135–145)

## 2023-08-06 LAB — MAGNESIUM: Magnesium: 2.2 mg/dL (ref 1.7–2.4)

## 2023-08-06 MED ORDER — POTASSIUM CHLORIDE CRYS ER 20 MEQ PO TBCR
60.0000 meq | EXTENDED_RELEASE_TABLET | Freq: Once | ORAL | Status: AC
  Administered 2023-08-06: 60 meq via ORAL
  Filled 2023-08-06: qty 3

## 2023-08-06 MED ORDER — ACETAMINOPHEN 325 MG PO TABS
650.0000 mg | ORAL_TABLET | Freq: Four times a day (QID) | ORAL | Status: DC
  Administered 2023-08-06 – 2023-08-10 (×15): 650 mg via ORAL
  Filled 2023-08-06 (×16): qty 2

## 2023-08-06 MED ORDER — TRAZODONE HCL 50 MG PO TABS
50.0000 mg | ORAL_TABLET | Freq: Every day | ORAL | Status: DC
  Administered 2023-08-06 – 2023-08-09 (×4): 50 mg via ORAL
  Filled 2023-08-06 (×4): qty 1

## 2023-08-06 MED ORDER — KETOROLAC TROMETHAMINE 15 MG/ML IJ SOLN
15.0000 mg | Freq: Four times a day (QID) | INTRAMUSCULAR | Status: AC | PRN
  Administered 2023-08-07: 15 mg via INTRAVENOUS
  Filled 2023-08-06: qty 1

## 2023-08-06 MED ORDER — OXYCODONE HCL 5 MG PO TABS
5.0000 mg | ORAL_TABLET | Freq: Four times a day (QID) | ORAL | Status: DC | PRN
  Administered 2023-08-07 – 2023-08-09 (×3): 5 mg via ORAL
  Filled 2023-08-06 (×3): qty 1

## 2023-08-06 MED ORDER — METOPROLOL TARTRATE 5 MG/5ML IV SOLN: 2.5000 mg | INTRAVENOUS | Status: DC | PRN

## 2023-08-06 MED ORDER — DILTIAZEM HCL 30 MG PO TABS
30.0000 mg | ORAL_TABLET | Freq: Four times a day (QID) | ORAL | Status: DC | PRN
  Administered 2023-08-06: 30 mg via ORAL
  Filled 2023-08-06: qty 1

## 2023-08-06 MED ORDER — DILTIAZEM HCL 30 MG PO TABS
30.0000 mg | ORAL_TABLET | Freq: Three times a day (TID) | ORAL | Status: DC
  Administered 2023-08-06 – 2023-08-07 (×2): 30 mg via ORAL
  Filled 2023-08-06 (×3): qty 1

## 2023-08-06 NOTE — NC FL2 (Signed)
[place] MEDICAID FL2 LEVEL OF CARE FORM     IDENTIFICATION  Patient Name: Norma Barajas Birthdate: 23-Aug-1941 Sex: female Admission Date (Current Location): 08/05/2023  [facility] and [place] Number:  [employer] and Address:  [facility],  [address], [place] [address]      Provider Number:    Attending Physician Name and Address:  Almon Hercules, MD  Relative Name and Phone Number:  Ernest Mallick (Niece)  [phone] [facility])    Current Level of Care: Hospital Recommended Level of Care: Skilled Nursing Facility Prior Approval Number:    Date Approved/Denied:   PASRR Number: [id number]  Discharge Plan: SNF    Current Diagnoses: Patient Active Problem List   Diagnosis Date Noted   Pelvic fracture (HCC) 08/05/2023   Vertigo 07/01/2023   Afib (HCC) 04/27/2023   Atrial fibrillation with RVR (HCC) 04/16/2023   Stage 3a chronic kidney disease (HCC) 11/03/2022   Iron deficiency 11/03/2022   Nonspecific abnormal electrocardiogram (ECG) (EKG) 08/29/2022   Abnormal SPEP 02/07/2022   Degenerative disc disease, lumbar 12/20/2021   Hypertensive heart disease with chronic diastolic congestive heart failure (HCC) 03/27/2021   Gallbladder sludge 03/27/2021   Aortic atherosclerosis (HCC) 03/27/2021   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) 10/23/2020   Severe tricuspid valve regurgitation 10/23/2020   Hypokalemia 09/16/2020   Early satiety 07/15/2020   Difficulty sleeping 06/22/2019   Paroxysmal atrial fibrillation (HCC) 08/21/2015   Lumbar pain 08/07/2015   Drug induced constipation 12/14/2013   HTN (hypertension) 07/24/2013   Coronary atherosclerosis of native coronary artery 07/24/2013   Pulmonary nodule 02/18/2011   Hyperlipidemia 07/29/2008   GAD (generalized anxiety disorder) 07/29/2008   Primary hypertension 07/29/2008   GERD 07/29/2008   Osteoporosis 07/29/2008    Orientation RESPIRATION  BLADDER Height & Weight     Self, Time, Situation, Place  Normal External catheter Weight: 112 lb 7 oz (51 kg) Height:  4\' 8"  (142.2 cm)  BEHAVIORAL SYMPTOMS/MOOD NEUROLOGICAL BOWEL NUTRITION STATUS      Continent Diet  AMBULATORY STATUS COMMUNICATION OF NEEDS Skin   Limited Assist Verbally Normal                       Personal Care Assistance Level of Assistance  Bathing, Feeding, Dressing Bathing Assistance: Limited assistance Feeding assistance: Limited assistance Dressing Assistance: Limited assistance     Functional Limitations Info  Sight, Hearing Sight Info: Impaired Hearing Info: Impaired      SPECIAL CARE FACTORS FREQUENCY                       Contractures Contractures Info: Not present    Additional Factors Info  Code Status Code Status Info: DNR             Current Medications (08/06/2023):  This is the current hospital active medication list Current Facility-Administered Medications  Medication Dose Route Frequency Provider Last Rate Last Admin   acetaminophen (TYLENOL) tablet 650 mg  650 mg Oral Q6H WA Gonfa, Taye T, MD   650 mg at 08/06/23 1554   albuterol (PROVENTIL) (2.5 MG/3ML) 0.083% nebulizer solution 2.5 mg  2.5 mg Nebulization Q2H PRN Elgergawy, Leana Roe, MD       aspirin EC tablet 81 mg  81 mg Oral BID Elgergawy, Leana Roe, MD   81 mg at 08/06/23 0839   bisacodyl (DULCOLAX) suppository 10 mg  10 mg Rectal Daily PRN Elgergawy, Mliss Fritz  S, MD       Chlorhexidine Gluconate Cloth 2 % PADS 6 each  6 each Topical Q0600 Elgergawy, Leana Roe, MD   6 each at 08/06/23 0548   cyanocobalamin (VITAMIN B12) injection 1,000 mcg  1,000 mcg Subcutaneous Daily Elgergawy, Leana Roe, MD   1,000 mcg at 08/06/23 0839   diltiazem (CARDIZEM) tablet 30 mg  30 mg Oral Q8H Gonfa, Taye T, MD       enoxaparin (LOVENOX) injection 30 mg  30 mg Subcutaneous Q24H Elgergawy, Leana Roe, MD       hydrALAZINE (APRESOLINE) injection 5 mg  5 mg Intravenous Q4H PRN Elgergawy,  Leana Roe, MD       ketorolac (TORADOL) 15 MG/ML injection 15 mg  15 mg Intravenous Q6H PRN Gonfa, Taye T, MD       methocarbamol (ROBAXIN) 500 mg in dextrose 5 % 50 mL IVPB  500 mg Intravenous Q6H PRN Elgergawy, Leana Roe, MD       metoprolol succinate (TOPROL-XL) 24 hr tablet 100 mg  100 mg Oral QPM Elgergawy, Leana Roe, MD       metoprolol tartrate (LOPRESSOR) injection 2.5 mg  2.5 mg Intravenous Q4H PRN Gonfa, Taye T, MD       oxyCODONE (Oxy IR/ROXICODONE) immediate release tablet 5 mg  5 mg Oral Q6H PRN Gonfa, Taye T, MD       pantoprazole (PROTONIX) EC tablet 40 mg  40 mg Oral Daily Elgergawy, Leana Roe, MD   40 mg at 08/06/23 0839   polyethylene glycol (MIRALAX / GLYCOLAX) packet 17 g  17 g Oral Daily PRN Elgergawy, Leana Roe, MD       senna (SENOKOT) tablet 8.6 mg  1 tablet Oral BID Elgergawy, Leana Roe, MD   8.6 mg at 08/06/23 5621     Discharge Medications: Please see discharge summary for a list of discharge medications.  Relevant Imaging Results:  Relevant Lab Results:   Additional Information [id number]  Catalina Gravel, LCSW

## 2023-08-06 NOTE — Plan of Care (Signed)
  Problem: Acute Rehab PT Goals(only PT should resolve) Goal: Pt Will Go Supine/Side To Sit Outcome: Progressing Flowsheets (Taken 08/06/2023 1302) Pt will go Supine/Side to Sit:  with minimal assist  with moderate assist Goal: Patient Will Transfer Sit To/From Stand Outcome: Progressing Flowsheets (Taken 08/06/2023 1302) Patient will transfer sit to/from stand: with minimal assist Goal: Pt Will Transfer Bed To Chair/Chair To Bed Outcome: Progressing Flowsheets (Taken 08/06/2023 1302) Pt will Transfer Bed to Chair/Chair to Bed:  with min assist  with mod assist Goal: Pt Will Ambulate Outcome: Progressing Flowsheets (Taken 08/06/2023 1302) Pt will Ambulate:  with minimal assist  with moderate assist  with rolling walker  25 feet   1:02 PM, 08/06/23 Ocie Bob, MPT Physical Therapist with [facility] [phone] office [phone] mobile phone

## 2023-08-06 NOTE — TOC Initial Note (Signed)
Transition of Care [facility]) - Initial/Assessment Note    Patient Details  Name: Norma Barajas MRN: [id number] Date of Birth: 1941-03-11  Transition of Care [facility]) CM/SW Contact:    Catalina Gravel, LCSW Phone Number: 08/06/2023, 5:03 PM  Clinical Narrative:                 Pt from ALF presented with a fall. Pt interested in SNF for rehabilitation. [facility] first choice as she was there in the past.  CSW agreed to request as first choice. Pt willing to be considered for facilities in this immediate area as well. PASSR and FL2 complete bed offers sent.  Will need Auth. TOC to follow.  Expected Discharge Plan: Skilled Nursing Facility Barriers to Discharge: Continued Medical Work up   Patient Goals and CMS Choice Patient states their goals for this hospitalization and ongoing recovery are:: SNF Rehab   Choice offered to / list presented to : Patient [place] ownership interest in [facility].provided to:: Patient    Expected Discharge Plan and Services In-house Referral: Clinical Social Work   Post Acute Care Choice: Skilled Nursing Facility Living arrangements for the past 2 months: Single Family Home                                      Prior Living Arrangements/Services Living arrangements for the past 2 months: Single Family Home Lives with:: Self   Do you feel safe going back to the place where you live?: No   SNF first  Need for Family Participation in Patient Care: Yes (Comment) Care giver support system in place?: Yes (comment) Current home services: DME Criminal Activity/Legal Involvement Pertinent to Current Situation/Hospitalization: No - Comment as needed  Activities of Daily Living   ADL Screening (condition at time of admission) Does the patient have a NEW difficulty with bathing/dressing/toileting/self-feeding that is expected to last >3 days?: No Does the patient have a NEW difficulty with getting in/out of bed, walking, or climbing stairs  that is expected to last >3 days?: No Does the patient have a NEW difficulty with communication that is expected to last >3 days?: No Is the patient deaf or have difficulty hearing?: No Does the patient have difficulty seeing, even when wearing glasses/contacts?: No Does the patient have difficulty concentrating, remembering, or making decisions?: No  Permission Sought/Granted                  Emotional Assessment Appearance:: Appears stated age Attitude/Demeanor/Rapport: Gracious Affect (typically observed): Other (comment) Orientation: : Oriented to Self, Oriented to Place, Oriented to  Time, Oriented to Situation Alcohol / Substance Use: Not Applicable Psych Involvement: No (comment)  Admission diagnosis:  Pelvic fracture (HCC) [S32.9XXA] Patient Active Problem List   Diagnosis Date Noted   Pelvic fracture (HCC) 08/05/2023   Vertigo 07/01/2023   Afib (HCC) 04/27/2023   Atrial fibrillation with RVR (HCC) 04/16/2023   Stage 3a chronic kidney disease (HCC) 11/03/2022   Iron deficiency 11/03/2022   Nonspecific abnormal electrocardiogram (ECG) (EKG) 08/29/2022   Abnormal SPEP 02/07/2022   Degenerative disc disease, lumbar 12/20/2021   Hypertensive heart disease with chronic diastolic congestive heart failure (HCC) 03/27/2021   Gallbladder sludge 03/27/2021   Aortic atherosclerosis (HCC) 03/27/2021   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) 10/23/2020   Severe tricuspid valve regurgitation 10/23/2020   Hypokalemia 09/16/2020   Early  satiety 07/15/2020   Difficulty sleeping 06/22/2019   Paroxysmal atrial fibrillation (HCC) 08/21/2015   Lumbar pain 08/07/2015   Drug induced constipation 12/14/2013   HTN (hypertension) 07/24/2013   Coronary atherosclerosis of native coronary artery 07/24/2013   Pulmonary nodule 02/18/2011   Hyperlipidemia 07/29/2008   GAD (generalized anxiety disorder) 07/29/2008   Primary hypertension 07/29/2008    GERD 07/29/2008   Osteoporosis 07/29/2008   PCP:  Sonny Masters, FNP Pharmacy:   [facility] - [address] [place] - [address] [place] [address] Phone: [phone] Fax: [phone]  [facility] [place], [place] - [address] [place] [address] Phone: [phone] Fax: [phone]  [facility] [place], [place] - [address] [place] [address] Phone: [phone] Fax: [phone]     Social Determinants of Health (SDOH) Social History: SDOH Screenings   Food Insecurity: No Food Insecurity (08/05/2023)  Housing: Low Risk  (08/05/2023)  Transportation Needs: No Transportation Needs (08/05/2023)  Utilities: Not At Risk (08/05/2023)  Depression (PHQ2-9): Low Risk  (05/27/2023)  Financial Resource Strain: Low Risk  (07/04/2023)  Physical Activity: Unknown (07/04/2023)  Social Connections: Moderately Isolated (07/04/2023)  Stress: Stress Concern Present (07/04/2023)  Tobacco Use: Low Risk  (08/05/2023)   SDOH Interventions:     Readmission Risk Interventions    03/26/2021   12:33 PM 03/25/2021    2:59 PM  Readmission Risk Prevention Plan  Medication Screening  Complete  Transportation Screening  Complete  PCP or Specialist Appt within 5-7 Days Complete   Home Care Screening Complete   Medication Review (RN CM) Complete

## 2023-08-06 NOTE — Plan of Care (Signed)

## 2023-08-06 NOTE — Evaluation (Signed)
Physical Therapy Evaluation Patient Details Name: Norma Barajas MRN: [id number] DOB: 10/16/1941 Today's Date: 08/06/2023  History of Present Illness  Norma Barajas  is a 82 y.o. female, with a history of BPPV, chronic atrial fibrillation, hypertension, hyperlipidemia, diastolic CHF, GERD, with recent hospitalization last month secondary to vertigo, thought to be secondary to A-fib with RVR and BPPV, patient lives in [facility], patient sent by facility for a fall evaluation, patient usually ambulates with a walker, she does report vertigo intermittently, falls in the past, but none for a few month, patient reports vertigo and dizziness, both together when she was ambulating with a walker, she on the floor, she denies any loss of consciousness, head trauma, she complains of pain in pelvic area, denies any chest pain, shortness of breath, fever or chills.  .   Clinical Impression  Patient demonstrates slow labored movement for sitting up at bedside with poor tolerance for moving LLE due to increasing groin/hip pain.  Patient limited to a few side steps at bedside with c/o increased pain when weightbearing on LLE and had to sit due to fall risk/poor standing balance.  Patient tolerated sitting up in chair after therapy with her niece present.  Patient on room air throughout visit with SpO2 at 94% - nursing staff notified.  Patient will benefit from continued skilled physical therapy in hospital and recommended venue below to increase strength, balance, endurance for safe ADLs and gait.           If plan is discharge home, recommend the following: A lot of help with bathing/dressing/bathroom;A lot of help with walking and/or transfers;Help with stairs or ramp for entrance;Assistance with cooking/housework   Can travel by private vehicle   Yes    Equipment Recommendations None recommended by PT  Recommendations for Other Services       Functional Status Assessment Patient has had a recent decline  in their functional status and demonstrates the ability to make significant improvements in function in a reasonable and predictable amount of time.     Precautions / Restrictions Precautions Precautions: Fall Restrictions Weight Bearing Restrictions: Yes LLE Weight Bearing: Weight bearing as tolerated      Mobility  Bed Mobility Overal bed mobility: Needs Assistance Bed Mobility: Supine to Sit     Supine to sit: Mod assist, Max assist     General bed mobility comments: increased time, labored movement, diffiuclty moving LLE due to increasing pain    Transfers Overall transfer level: Needs assistance Equipment used: Rolling walker (2 wheels) Transfers: Sit to/from Stand, Bed to chair/wheelchair/BSC Sit to Stand: Min assist, Mod assist   Step pivot transfers: Mod assist       General transfer comment: unsteady labored movement with increased left groin/hip pain    Ambulation/Gait Ambulation/Gait assistance: Mod assist, Max assist Gait Distance (Feet): 5 Feet Assistive device: Rolling walker (2 wheels) Gait Pattern/deviations: Decreased step length - left, Decreased stride length, Decreased stance time - left, Antalgic, Shuffle Gait velocity: slow     General Gait Details: limited to a few side steps at bedside before having to sit due to increasing left groin pain and poor standing balance  Stairs            Wheelchair Mobility     Tilt Bed    Modified Rankin (Stroke Patients Only)       Balance Overall balance assessment: Needs assistance Sitting-balance support: Feet supported, No upper extremity supported Sitting balance-Leahy Scale: Fair Sitting balance - Comments: seated  at EOB   Standing balance support: Reliant on assistive device for balance, During functional activity, Bilateral upper extremity supported Standing balance-Leahy Scale: Poor Standing balance comment: using RW                             Pertinent Vitals/Pain  Pain Assessment Pain Assessment: Faces Faces Pain Scale: Hurts even more Pain Location: left hip/groin area Pain Descriptors / Indicators: Sore, Sharp, Grimacing, Guarding Pain Intervention(s): Limited activity within patient's tolerance, Monitored during session, Premedicated before session, Repositioned    Home Living Family/patient expects to be discharged to:: Assisted living                 Home Equipment: Tub bench;Grab bars - tub/shower;Grab bars - toilet;Rollator (4 wheels);Rolling Walker (2 wheels);Cane - single point;BSC/3in1;Wheelchair - manual 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    Prior Function Prior Level of Function : Needs assist       Physical Assist : Mobility (physical);ADLs (physical) Mobility (physical): Bed mobility;Transfers;Gait;Stairs   Mobility Comments: Chief Executive Officer ADLs Comments: neice helps with IADLs     Extremity/Trunk Assessment   Upper Extremity Assessment Upper Extremity Assessment: Defer to OT evaluation    Lower Extremity Assessment Lower Extremity Assessment: Generalized weakness;LLE deficits/detail LLE Deficits / Details: grossly -3/5 LLE: Unable to fully assess due to pain LLE Sensation: WNL LLE Coordination: WNL    Cervical / Trunk Assessment Cervical / Trunk Assessment: Normal  Communication   Communication Communication: No apparent difficulties  Cognition Arousal: Alert Behavior During Therapy: WFL for tasks assessed/performed Overall Cognitive Status: Within Functional Limits for tasks assessed                                          General Comments      Exercises     Assessment/Plan    PT Assessment Patient needs continued PT services  PT Problem List Decreased strength;Decreased activity tolerance;Decreased balance;Decreased mobility;Pain       PT Treatment Interventions DME instruction;Gait training;Stair training;Functional mobility  training;Therapeutic activities;Therapeutic exercise;Balance training;Patient/family education    PT Goals (Current goals can be found in the Care Plan section)  Acute Rehab PT Goals Patient Stated Goal: return home after rehab PT Goal Formulation: With patient/family Time For Goal Achievement: 08/20/23 Potential to Achieve Goals: Good    Frequency Min 3X/week     Co-evaluation               AM-PAC PT "6 Clicks" Mobility  Outcome Measure Help needed turning from your back to your side while in a flat bed without using bedrails?: A Lot Help needed moving from lying on your back to sitting on the side of a flat bed without using bedrails?: A Lot Help needed moving to and from a bed to a chair (including a wheelchair)?: A Lot Help needed standing up from a chair using your arms (e.g., wheelchair or bedside chair)?: A Lot Help needed to walk in hospital room?: A Lot Help needed climbing 3-5 steps with a railing? : Total 6 Click Score: 11    End of Session   Activity Tolerance: Patient tolerated treatment well;Patient limited by fatigue Patient left: in chair;with call bell/phone within reach;with family/visitor present Nurse Communication: Mobility status PT Visit Diagnosis: Unsteadiness on feet (R26.81);Other abnormalities of gait and mobility (R26.89);Muscle weakness (generalized) (M62.81)  Time: 1610-9604 PT Time Calculation (min) (ACUTE ONLY): 26 min   Charges:   PT Evaluation $PT Eval Moderate Complexity: 1 Mod PT Treatments $Therapeutic Activity: 23-37 mins PT General Charges $$ ACUTE PT VISIT: 1 Visit         1:00 PM, 08/06/23 Ocie Bob, MPT Physical Therapist with [facility] [phone] office [phone] mobile phone

## 2023-08-06 NOTE — Progress Notes (Addendum)
   08/06/23 1258  Vitals  BP 129/79  MAP (mmHg) 90  BP Location Right Arm  BP Method Automatic  Patient Position (if appropriate) Sitting  Pulse Rate (!) 115  Pulse Rate Source Apical  Resp 20  Level of Consciousness  Level of Consciousness Alert  MEWS COLOR  MEWS Score Color Yellow  Oxygen Therapy  SpO2 92 %  O2 Device Room Air  Pain Assessment  Pain Scale 0-10  Pain Score 0  MEWS Score  MEWS Temp 0  MEWS Systolic 0  MEWS Pulse 2  MEWS RR 0  MEWS LOC 0  MEWS Score 2   Pt noted to be in afib with rate of 140/min on monitor. In to evaluate pt, pt up in recliner, A&O talking with visitor, denies any c/o at present other than "feeling tired". Apical HR irregular, 115/min. Pt with hx of a-fib. Skin warm and dry, cap refill < 3 sec, color appropriate for ethnicity. Breath sounds clear bilaterally. VS as above. Pt assisted back to bed using walker and 2 person assist, tolerated well. Pt allowed to settle in bed to see if HR would remain <120/min. Heart rate rechecked 5 minutes after back in bed, reading 134-157/min a-fib. Pt states, "My a-fib is really acting up now. I can feel it fluttering in my chest." Pt with no other c/o. MD Alanda Slim notified of current condition.

## 2023-08-06 NOTE — Progress Notes (Signed)
   08/06/23 1505  Vitals  Temp 97.9 F (36.6 C)  BP 113/73  MAP (mmHg) 85  BP Location Right Arm  BP Method Automatic  Patient Position (if appropriate) Lying  Pulse Rate 95  Resp 20  MEWS COLOR  MEWS Score Color Green  Oxygen Therapy  SpO2 97 %  O2 Device Nasal Cannula  MEWS Score  MEWS Temp 0  MEWS Systolic 0  MEWS Pulse 0  MEWS RR 0  MEWS LOC 0  MEWS Score 0

## 2023-08-06 NOTE — Progress Notes (Signed)
PROGRESS NOTE  Norma Barajas WUJ:[id number] DOB: 1941-08-27   PCP: Sonny Masters, FNP  Patient is from: ALF.  Uses rolling walker at baseline  DOA: 08/05/2023 LOS: 1  Chief complaints Chief Complaint  Patient presents with   Dizziness   Fall     Brief Narrative / Interim history: 82 year old F with PMH of diastolic CHF, A-fib not on AC due to high fall risk, severe TR CKD-3A, BPPV/vertigo, HTN, HLD and GERD brought to ED from [facility] due to dizziness and fall at ALF, and admitted for A-fib with RVR and pelvic fractures including left superior and inferior pubic rami fractures and possible left sacral fracture.     Subjective: Seen and examined earlier this morning.  No major events overnight of this morning.  Reports improvement in her pain.  Heart rate in 70s and 80s earlier this morning but she went into A-fib with RVR to 140 days later after working with therapy.  Heart rate improved to 120s after p.o. Cardizem.  Other vital stable.  Objective: Vitals:   08/05/23 2305 08/06/23 0200 08/06/23 0508 08/06/23 1258  BP: (!) 153/99 (!) 140/83 (!) 154/99 129/79  Pulse: 75 84 89 (!) 115  Resp:  18 18 20   Temp: 97.8 F (36.6 C) 97.8 F (36.6 C) 98 F (36.7 C)   TempSrc: Oral     SpO2: 98% 95% 96% 92%  Weight:      Height:        Examination:  GENERAL: No apparent distress.  Nontoxic. HEENT: MMM.  Vision and hearing grossly intact.  NECK: Supple.  No apparent JVD.  RESP:  No IWOB.  Fair aeration bilaterally. CVS: Irregular rhythm.  Normal rate.  2/6 SEM over LLSB.  ABD/GI/GU: BS+. Abd soft, NTND.  MSK/EXT:  Moves extremities. No apparent deformity. No edema.  SKIN: no apparent skin lesion or wound NEURO: Awake, alert and oriented appropriately.  No apparent focal neuro deficit. PSYCH: Calm. Normal affect.   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  Had DCCV in the past.  Per cardiology note in 7/24, he refused additional DCCV's.  Reportedly failed sotalol.  Not on AC due to high fall risk.  TTE in 6/24 with LVEF of 55% and RVSP of 30 mmHg, severe LAE and RAE and severe TR.  HR up to 130s while in ED but improved with IV Cardizem pushes and Toprol-XL.  It seems like she is on Toprol-XL 200 mg daily with as needed Cardizem.  Continues to have intermittent RVR. -Continue Toprol-XL 100 mg daily.  On 200 mg at home. -P.o. Cardizem 30 mg every 8 hours -IV metoprolol 2.5 mg every 4 hours as needed sustained HR > 120. -Optimize electrolytes -May consider cardiology consult if heart failure persists.  Severe tricuspid valve regurgitation: Noted on echocardiogram on 04/2023. -Outpatient follow-up with cardiology   Pelvic fracture due to fall at ALF: Patient with known history of vertigo/BPPV.  She was also in A-fib with RVR.  Reportedly hypotensive.  No report of head trauma or LOC.  Pelvic x-ray with left superior and inferior pubic  rami fracture and possible sacral fracture.  No focal neurosymptoms on exam.  No nystagmus. -ED discussed with Dr. Dallas Schimke,  condition for weightbearing as tolerated, and pain control -Scheduled Tylenol with as needed Toradol, Robaxin and oxycodone -Bowel regimen -PT/OT-recommended SNF -Fall precaution   Hypotension/essential hypertension: Soft BP while in ED but improved. -Cardiac meds as above. -Discontinue amlodipine   CKD-3A?  Baseline Cr ~0.7-0.8.  Renal function improved Recent Labs    04/27/23 1508 04/28/23 0406 05/05/23 0849 05/19/23 0826 05/27/23 1022 07/01/23 1137 07/02/23 0545  07/06/23 1233 08/05/23 1233 08/06/23 0358  BUN 32* 19 31* 17 22 24* 16 27 27* 22  CREATININE 1.06* 0.71 1.24* 0.83 1.20* 0.96 0.90 1.07* 1.16* 0.85  -Continue monitoring   Chronic HFpEF: Appears euvolemic.  No respiratory distress. -Closely monitor   Hyperlipidemia -Continue statin   GERD -Continue pantoprazole  Body mass index is 25.21 kg/m.          DVT prophylaxis:  enoxaparin (LOVENOX) injection 30 mg Start: 08/06/23 2200 SCDs Start: 08/05/23 1615  Code Status: DNR/DNI Family Communication: Updated patient's daughter at bedside Level of care: Telemetry Status is: Inpatient Remains inpatient appropriate because: Due to pelvic fracture, fall and A-fib with RVR   Final disposition: SNF Consultants:  Orthopedic surgery  55 minutes with more than 50% spent in reviewing records, counseling patient/family and coordinating care.   Sch Meds:  Scheduled Meds:  aspirin EC  81 mg Oral BID   Chlorhexidine Gluconate Cloth  6 each Topical Q0600   cyanocobalamin  1,000 mcg Subcutaneous Daily   diltiazem  30 mg Oral Q8H   enoxaparin (LOVENOX) injection  30 mg Subcutaneous Q24H   metoprolol  100 mg Oral QPM   pantoprazole  40 mg Oral Daily   senna  1 tablet Oral BID   Continuous Infusions:  methocarbamol (ROBAXIN) IV     PRN Meds:.acetaminophen **OR** acetaminophen, albuterol, bisacodyl, hydrALAZINE, HYDROcodone-acetaminophen, ketorolac, methocarbamol (ROBAXIN) IV, metoprolol tartrate, polyethylene glycol  Antimicrobials: Anti-infectives (From admission, onward)    None        I have personally reviewed the following labs and images: CBC: Recent Labs  Lab 08/05/23 1233 08/06/23 0358  WBC 9.4 11.0*  HGB 16.0* 14.7  HCT 48.4* 44.2  MCV 97.0 96.3  PLT 130* 130*   BMP &GFR Recent Labs  Lab 08/05/23 1233 08/05/23 1247 08/06/23 0358 08/06/23 0806  NA 135  --  138  --   K 3.8  --  3.5  --   CL 94*  --  95*  --   CO2 27  --  30  --   GLUCOSE 159*   --  98  --   BUN 27*  --  22  --   CREATININE 1.16*  --  0.85  --   CALCIUM 8.8*  --  8.7*  --   MG  --  2.2  --  2.2   Estimated Creatinine Clearance: 32.8 mL/min (by C-G formula based on SCr of 0.85 mg/dL). Liver & Pancreas: No results for input(s): "AST", "ALT", "ALKPHOS", "BILITOT", "PROT", "ALBUMIN" in the last 168 hours. No results for input(s): "LIPASE", "AMYLASE" in the last 168 hours. No results for input(s): "AMMONIA" in the last 168 hours. Diabetic: No results for input(s): "HGBA1C" in the last 72 hours. No results for input(s): "GLUCAP" in the last 168 hours. Cardiac Enzymes: No results for input(s): "CKTOTAL", "CKMB", "CKMBINDEX", "TROPONINI" in the last 168 hours. No results for input(s): "PROBNP" in the last 8760 hours.  Coagulation Profile: No results for input(s): "INR", "PROTIME" in the last 168 hours. Thyroid Function Tests: No results for input(s): "TSH", "T4TOTAL", "FREET4", "T3FREE", "THYROIDAB" in the last 72 hours. Lipid Profile: No results for input(s): "CHOL", "HDL", "LDLCALC", "TRIG", "CHOLHDL", "LDLDIRECT" in the last 72 hours. Anemia Panel: No results for input(s): "VITAMINB12", "FOLATE", "FERRITIN", "TIBC", "IRON", "RETICCTPCT" in the last 72 hours. Urine analysis:    Component Value Date/Time   COLORURINE YELLOW 07/01/2023 1244   APPEARANCEUR CLEAR 07/01/2023 1244   APPEARANCEUR Clear 07/31/2020 1545   LABSPEC 1.009 07/01/2023 1244   PHURINE 8.0 07/01/2023 1244   GLUCOSEU NEGATIVE 07/01/2023 1244   HGBUR NEGATIVE 07/01/2023 1244   BILIRUBINUR NEGATIVE 07/01/2023 1244   BILIRUBINUR Negative 07/31/2020 1545   KETONESUR NEGATIVE 07/01/2023 1244   PROTEINUR NEGATIVE 07/01/2023 1244   UROBILINOGEN negative 02/06/2015 1252   NITRITE NEGATIVE 07/01/2023 1244   LEUKOCYTESUR NEGATIVE 07/01/2023 1244   Sepsis Labs: Invalid input(s): "PROCALCITONIN", "LACTICIDVEN"  Microbiology: Recent Results (from the past 240 hour(s))  MRSA Next Gen by PCR, Nasal      Status: None   Collection Time: 08/05/23  4:00 PM   Specimen: Nasal Mucosa; Nasal Swab  Result Value Ref Range Status   MRSA by PCR Next Gen NOT DETECTED NOT DETECTED Final    Comment: (NOTE) The GeneXpert MRSA Assay (FDA approved for NASAL specimens only), is one component of a comprehensive MRSA colonization surveillance program. It is not intended to diagnose MRSA infection nor to guide or monitor treatment for MRSA infections. Test performance is not FDA approved in patients less than 46 years old. Performed at [facility], [address] [place], [place] [address]     Radiology Studies: No results found.    Maurina Fawaz T. Demita Tobia [facility]  If 7PM-7AM, please contact night-coverage [email or web address] 08/06/2023, 2:08 PM

## 2023-08-07 ENCOUNTER — Inpatient Hospital Stay (HOSPITAL_COMMUNITY): Payer: Medicare HMO

## 2023-08-07 DIAGNOSIS — I4891 Unspecified atrial fibrillation: Secondary | ICD-10-CM | POA: Diagnosis not present

## 2023-08-07 DIAGNOSIS — F411 Generalized anxiety disorder: Secondary | ICD-10-CM | POA: Diagnosis not present

## 2023-08-07 DIAGNOSIS — I5082 Biventricular heart failure: Secondary | ICD-10-CM | POA: Diagnosis not present

## 2023-08-07 DIAGNOSIS — S3282XA Multiple fractures of pelvis without disruption of pelvic ring, initial encounter for closed fracture: Secondary | ICD-10-CM | POA: Diagnosis not present

## 2023-08-07 LAB — CBC
HCT: 40.4 % (ref 36.0–46.0)
Hemoglobin: 13 g/dL (ref 12.0–15.0)
MCH: 31.3 pg (ref 26.0–34.0)
MCHC: 32.2 g/dL (ref 30.0–36.0)
MCV: 97.3 fL (ref 80.0–100.0)
Platelets: 99 10*3/uL — ABNORMAL LOW (ref 150–400)
RBC: 4.15 MIL/uL (ref 3.87–5.11)
RDW: 13.4 % (ref 11.5–15.5)
WBC: 8.8 10*3/uL (ref 4.0–10.5)
nRBC: 0 % (ref 0.0–0.2)

## 2023-08-07 LAB — RENAL FUNCTION PANEL
Albumin: 3.1 g/dL — ABNORMAL LOW (ref 3.5–5.0)
Anion gap: 10 (ref 5–15)
BUN: 23 mg/dL (ref 8–23)
CO2: 28 mmol/L (ref 22–32)
Calcium: 8.5 mg/dL — ABNORMAL LOW (ref 8.9–10.3)
Chloride: 95 mmol/L — ABNORMAL LOW (ref 98–111)
Creatinine, Ser: 0.9 mg/dL (ref 0.44–1.00)
GFR, Estimated: >60 mL/min (ref >60)
Glucose, Bld: 90 mg/dL (ref 70–99)
Phosphorus: 2.9 mg/dL (ref 2.5–4.6)
Potassium: 4.2 mmol/L (ref 3.5–5.1)
Sodium: 133 mmol/L — ABNORMAL LOW (ref 135–145)

## 2023-08-07 LAB — MAGNESIUM: Magnesium: 2.1 mg/dL (ref 1.7–2.4)

## 2023-08-07 LAB — BRAIN NATRIURETIC PEPTIDE: B Natriuretic Peptide: 723 pg/mL — ABNORMAL HIGH (ref 0.0–100.0)

## 2023-08-07 MED ORDER — METOPROLOL SUCCINATE ER 50 MG PO TB24
100.0000 mg | ORAL_TABLET | Freq: Two times a day (BID) | ORAL | Status: DC
  Administered 2023-08-07 – 2023-08-08 (×4): 100 mg via ORAL
  Filled 2023-08-07 (×4): qty 2

## 2023-08-07 MED ORDER — SENNOSIDES-DOCUSATE SODIUM 8.6-50 MG PO TABS
2.0000 | ORAL_TABLET | Freq: Two times a day (BID) | ORAL | Status: AC
  Administered 2023-08-07 – 2023-08-08 (×2): 2 via ORAL
  Filled 2023-08-07 (×2): qty 2

## 2023-08-07 MED ORDER — TAB-A-VITE/IRON PO TABS
1.0000 | ORAL_TABLET | Freq: Every day | ORAL | Status: DC
  Administered 2023-08-07 – 2023-08-10 (×4): 1 via ORAL
  Filled 2023-08-07 (×4): qty 1

## 2023-08-07 MED ORDER — SENNOSIDES-DOCUSATE SODIUM 8.6-50 MG PO TABS
1.0000 | ORAL_TABLET | Freq: Two times a day (BID) | ORAL | Status: DC
  Administered 2023-08-08 – 2023-08-10 (×4): 1 via ORAL
  Filled 2023-08-07 (×4): qty 1

## 2023-08-07 MED ORDER — FUROSEMIDE 10 MG/ML IJ SOLN
20.0000 mg | Freq: Once | INTRAMUSCULAR | Status: AC
  Administered 2023-08-07: 20 mg via INTRAVENOUS
  Filled 2023-08-07: qty 2

## 2023-08-07 MED ORDER — POLYETHYLENE GLYCOL 3350 17 G PO PACK
17.0000 g | PACK | Freq: Two times a day (BID) | ORAL | Status: DC | PRN
  Filled 2023-08-07: qty 1

## 2023-08-07 MED ORDER — POLYETHYLENE GLYCOL 3350 17 G PO PACK
17.0000 g | PACK | Freq: Two times a day (BID) | ORAL | Status: AC
  Administered 2023-08-07: 17 g via ORAL
  Filled 2023-08-07: qty 1

## 2023-08-07 MED ORDER — BOOST / RESOURCE BREEZE PO LIQD CUSTOM
1.0000 | Freq: Three times a day (TID) | ORAL | Status: DC
  Administered 2023-08-07 – 2023-08-10 (×6): 1 via ORAL

## 2023-08-07 NOTE — Progress Notes (Signed)
PROGRESS NOTE  Norma Barajas AOZ:[id number] DOB: 1941/01/31   PCP: Sonny Masters, FNP  Patient is from: ALF.  Uses rolling walker at baseline  DOA: 08/05/2023 LOS: 2  Chief complaints Chief Complaint  Patient presents with   Dizziness   Fall     Brief Narrative / Interim history: 82 year old F with PMH of diastolic CHF, A-fib not on AC due to high fall risk, severe TR CKD-3A, BPPV/vertigo, HTN, HLD and GERD brought to ED from [facility] due to dizziness and fall at ALF, and admitted for A-fib with RVR and pelvic fractures including left superior and inferior pubic rami fractures and possible left sacral fracture.   Pain improving.  RVR resolved.  Therapy recommended SNF.    Subjective: Seen and examined earlier this morning.  No major events overnight of this morning.  Reports improvement in pain.  She rates her pain 5/10.  Some shortness of breath with talking.  Has not a bowel movement yet.  Also reports poor appetite.  Reports drinking boost at ALF.  Objective: Vitals:   08/06/23 1854 08/06/23 2246 08/07/23 0412 08/07/23 0852  BP: 123/83 128/88 126/87 130/86  Pulse: 100 84 85 86  Resp: (!) 22 20    Temp: 98.4 F (36.9 C) 98.2 F (36.8 C) 98 F (36.7 C)   TempSrc: Oral  Oral   SpO2: 98% 97% 98%   Weight:      Height:        Examination:  GENERAL: No apparent distress.  Nontoxic. HEENT: MMM.  Vision and hearing grossly intact.  NECK: Supple.  No apparent JVD.  RESP:  No IWOB.  Fair aeration bilaterally. CVS: Irregular rhythm.  Normal rate.  2/6 SEM over LLSB.  ABD/GI/GU: BS+. Abd soft, NTND.  MSK/EXT:  Moves extremities but weak bilaterally. No apparent deformity. No edema.  SKIN: no apparent skin lesion or wound NEURO: Awake, alert and oriented appropriately.  No apparent focal neuro deficit other than BLE weakness. PSYCH: Calm. Normal affect.   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  Had DCCV in the past.  Per cardiology note in 7/24, he refused additional DCCV's.  Reportedly failed sotalol.  Not on AC due to high fall risk.  TTE in 6/24 with LVEF of 55% and RVSP of 30 mmHg, severe LAE and RAE and severe TR.  HR up to 130s while in ED but improved with IV Cardizem pushes and Toprol-XL.  It seems like she is on Toprol-XL 200 mg daily with as needed Cardizem.  Continues to have intermittent RVR. -Toprol-XL 100 mg twice daily -Discontinue p.o. Cardizem -IV metoprolol 2.5 mg every 4 hours as needed sustained HR > 120. -Optimize electrolytes -May consider cardiology consult if heart failure persists.  Severe tricuspid valve regurgitation: Noted on echocardiogram on 04/2023. -Outpatient follow-up with cardiology   Pelvic fracture due to fall at ALF: Patient with known history of vertigo/BPPV.  She was also in A-fib with RVR.  Reportedly hypotensive.  No report of head trauma or LOC.  Pelvic x-ray with left superior and inferior pubic rami fracture and possible  sacral fracture.  No focal neurosymptoms on exam.  No nystagmus. -ED discussed with Dr. Dallas Schimke,  condition for weightbearing as tolerated, and pain control -Scheduled Tylenol with as needed Toradol, Robaxin and oxycodone -Increase bowel regimen -PT/OT-recommended SNF -Fall precaution   Hypotension/essential hypertension: Hypotension resolved. -Cardiac meds as above. -Discontinue amlodipine   CKD-3A?  Baseline Cr ~0.7-0.8.  Renal function improved Recent Labs    04/28/23 0406 05/05/23 0849 05/19/23 0826 05/27/23 1022 07/01/23 1137 07/02/23 0545 07/06/23 1233  08/05/23 1233 08/06/23 0358 08/07/23 0421  BUN 19 31* 17 22 24* 16 27 27* 22 23  CREATININE 0.71 1.24* 0.83 1.20* 0.96 0.90 1.07* 1.16* 0.85 0.90  -Continue monitoring   Chronic HFpEF: She reports some shortness of breath but appears euvolemic. -Check x-ray and BNP -IV Lasix 20 mg x 1.  Reassess daily and dose as appropriate -Strict intake and output, daily weight -Encourage incentive spirometry -Closely monitor   Hyperlipidemia -Continue statin   GERD -Continue pantoprazole  Decreased oral intake Body mass index is 25.21 kg/m. -Add boost and multivitamin.         DVT prophylaxis:  enoxaparin (LOVENOX) injection 30 mg Start: 08/06/23 2200 SCDs Start: 08/05/23 1615  Code Status: DNR/DNI Family Communication: Updated patient's daughter at bedside on 9/28.  None at bedside today Level of care: Telemetry Status is: Inpatient Remains inpatient appropriate because: Pelvic fracture and A-fib with RVR.   Final disposition: SNF.  Medically stable for discharge Consultants:  Orthopedic surgery  35 minutes with more than 50% spent in reviewing records, counseling patient/family and coordinating care.   Sch Meds:  Scheduled Meds:  acetaminophen  650 mg Oral Q6H WA   aspirin EC  81 mg Oral BID   Chlorhexidine Gluconate Cloth  6 each Topical Q0600   cyanocobalamin  1,000 mcg Subcutaneous Daily   enoxaparin (LOVENOX) injection  30 mg Subcutaneous Q24H   feeding supplement  1 Container Oral TID BM   metoprolol  100 mg Oral BID   multivitamins with iron  1 tablet Oral Daily   pantoprazole  40 mg Oral Daily   polyethylene glycol  17 g Oral BID   senna-docusate  2 tablet Oral BID   Followed by   Melene Muller ON 08/08/2023] senna-docusate  1 tablet Oral BID   traZODone  50 mg Oral QHS   Continuous Infusions:  methocarbamol (ROBAXIN) IV     PRN Meds:.albuterol, bisacodyl, hydrALAZINE, ketorolac, methocarbamol (ROBAXIN) IV, metoprolol tartrate, oxyCODONE, polyethylene glycol  **FOLLOWED BY** [START ON 08/08/2023] polyethylene glycol  Antimicrobials: Anti-infectives (From admission, onward)    None        I have personally reviewed the following labs and images: CBC: Recent Labs  Lab 08/05/23 1233 08/06/23 0358 08/07/23 0421  WBC 9.4 11.0* 8.8  HGB 16.0* 14.7 13.0  HCT 48.4* 44.2 40.4  MCV 97.0 96.3 97.3  PLT 130* 130* 99*   BMP &GFR Recent Labs  Lab 08/05/23 1233 08/05/23 1247 08/06/23 0358 08/06/23 0806 08/07/23 0421  NA 135  --  138  --  133*  K 3.8  --  3.5  --  4.2  CL 94*  --  95*  --  95*  CO2 27  --  30  --  28  GLUCOSE 159*  --  98  --  90  BUN 27*  --  22  --  23  CREATININE 1.16*  --  0.85  --  0.90  CALCIUM 8.8*  --  8.7*  --  8.5*  MG  --  2.2  --  2.2 2.1  PHOS  --   --   --   --  2.9   Estimated Creatinine Clearance: 31 mL/min (by C-G formula based on SCr of 0.9 mg/dL). Liver & Pancreas: Recent Labs  Lab 08/07/23 0421  ALBUMIN 3.1*   No results for input(s): "LIPASE", "AMYLASE" in the last 168 hours. No results for input(s): "AMMONIA" in the last 168 hours. Diabetic: No results for input(s): "HGBA1C" in the last 72 hours. No results for input(s): "GLUCAP" in the last 168 hours. Cardiac Enzymes: No results for input(s): "CKTOTAL", "CKMB", "CKMBINDEX", "TROPONINI" in the last 168 hours. No results for input(s): "PROBNP" in the last 8760 hours. Coagulation Profile: No results for input(s): "INR", "PROTIME" in the last 168 hours. Thyroid Function Tests: No results for input(s): "TSH", "T4TOTAL", "FREET4", "T3FREE", "THYROIDAB" in the last 72 hours. Lipid Profile: No results for input(s): "CHOL", "HDL", "LDLCALC", "TRIG", "CHOLHDL", "LDLDIRECT" in the last 72 hours. Anemia Panel: No results for input(s): "VITAMINB12", "FOLATE", "FERRITIN", "TIBC", "IRON", "RETICCTPCT" in the last 72 hours. Urine analysis:    Component Value Date/Time   COLORURINE YELLOW 07/01/2023 1244   APPEARANCEUR CLEAR 07/01/2023 1244    APPEARANCEUR Clear 07/31/2020 1545   LABSPEC 1.009 07/01/2023 1244   PHURINE 8.0 07/01/2023 1244   GLUCOSEU NEGATIVE 07/01/2023 1244   HGBUR NEGATIVE 07/01/2023 1244   BILIRUBINUR NEGATIVE 07/01/2023 1244   BILIRUBINUR Negative 07/31/2020 1545   KETONESUR NEGATIVE 07/01/2023 1244   PROTEINUR NEGATIVE 07/01/2023 1244   UROBILINOGEN negative 02/06/2015 1252   NITRITE NEGATIVE 07/01/2023 1244   LEUKOCYTESUR NEGATIVE 07/01/2023 1244   Sepsis Labs: Invalid input(s): "PROCALCITONIN", "LACTICIDVEN"  Microbiology: Recent Results (from the past 240 hour(s))  MRSA Next Gen by PCR, Nasal     Status: None   Collection Time: 08/05/23  4:00 PM   Specimen: Nasal Mucosa; Nasal Swab  Result Value Ref Range Status   MRSA by PCR Next Gen NOT DETECTED NOT DETECTED Final    Comment: (NOTE) The GeneXpert MRSA Assay (FDA approved for NASAL specimens only), is one component of a comprehensive MRSA colonization surveillance program. It is not intended to diagnose MRSA infection nor to guide or monitor treatment for MRSA infections. Test performance is not FDA approved in patients less than 17 years old. Performed at [facility], [address] [place], [place] [address]     Radiology Studies: No results found.    Shaunte Weissinger T. Hazely Sealey [facility]  If 7PM-7AM, please contact night-coverage [email or web address] 08/07/2023, 12:59 PM

## 2023-08-07 NOTE — Plan of Care (Signed)

## 2023-08-08 DIAGNOSIS — S3282XA Multiple fractures of pelvis without disruption of pelvic ring, initial encounter for closed fracture: Secondary | ICD-10-CM | POA: Diagnosis not present

## 2023-08-08 LAB — RENAL FUNCTION PANEL
Albumin: 3.1 g/dL — ABNORMAL LOW (ref 3.5–5.0)
Anion gap: 12 (ref 5–15)
BUN: 22 mg/dL (ref 8–23)
CO2: 27 mmol/L (ref 22–32)
Calcium: 8.6 mg/dL — ABNORMAL LOW (ref 8.9–10.3)
Chloride: 95 mmol/L — ABNORMAL LOW (ref 98–111)
Creatinine, Ser: 0.86 mg/dL (ref 0.44–1.00)
GFR, Estimated: >60 mL/min (ref >60)
Glucose, Bld: 104 mg/dL — ABNORMAL HIGH (ref 70–99)
Phosphorus: 3.2 mg/dL (ref 2.5–4.6)
Potassium: 3.9 mmol/L (ref 3.5–5.1)
Sodium: 134 mmol/L — ABNORMAL LOW (ref 135–145)

## 2023-08-08 LAB — CBC
HCT: 41.1 % (ref 36.0–46.0)
Hemoglobin: 13.5 g/dL (ref 12.0–15.0)
MCH: 31.6 pg (ref 26.0–34.0)
MCHC: 32.8 g/dL (ref 30.0–36.0)
MCV: 96.3 fL (ref 80.0–100.0)
Platelets: 100 10*3/uL — ABNORMAL LOW (ref 150–400)
RBC: 4.27 MIL/uL (ref 3.87–5.11)
RDW: 13.7 % (ref 11.5–15.5)
WBC: 7.2 10*3/uL (ref 4.0–10.5)
nRBC: 0 % (ref 0.0–0.2)

## 2023-08-08 LAB — MAGNESIUM: Magnesium: 2 mg/dL (ref 1.7–2.4)

## 2023-08-08 MED ORDER — TORSEMIDE 20 MG PO TABS
20.0000 mg | ORAL_TABLET | Freq: Two times a day (BID) | ORAL | Status: DC
  Administered 2023-08-08 – 2023-08-10 (×5): 20 mg via ORAL
  Filled 2023-08-08 (×5): qty 1

## 2023-08-08 MED ORDER — ENOXAPARIN SODIUM 40 MG/0.4ML IJ SOSY
40.0000 mg | PREFILLED_SYRINGE | INTRAMUSCULAR | Status: DC
  Administered 2023-08-08 – 2023-08-10 (×2): 40 mg via SUBCUTANEOUS
  Filled 2023-08-08 (×3): qty 0.4

## 2023-08-08 MED ORDER — AMLODIPINE BESYLATE 5 MG PO TABS
2.5000 mg | ORAL_TABLET | Freq: Every day | ORAL | Status: DC
  Administered 2023-08-08 – 2023-08-10 (×3): 2.5 mg via ORAL
  Filled 2023-08-08 (×3): qty 1

## 2023-08-08 NOTE — Care Management Important Message (Signed)
Important Message  Patient Details  Name: KYLIEGH JESTER MRN: [id number] Date of Birth: 09/17/1941   Important Message Given:  N/A - LOS <3 / Initial given by admissions     Corey Harold 08/08/2023, 10:43 AM

## 2023-08-08 NOTE — Progress Notes (Signed)
   08/08/23 1809  Assess: MEWS Score  BP (!) 158/113  MAP (mmHg) 128  ECG Heart Rate (!) 137  Resp (!) 28  SpO2 95 %  O2 Device Nasal Cannula  Assess: MEWS Score  MEWS Temp 0  MEWS Systolic 0  MEWS Pulse 3  MEWS RR 2  MEWS LOC 0  MEWS Score 5  MEWS Score Color Red  Provider Notification  Provider Name/Title Dr Jarvis Newcomer  Date Provider Notified 08/08/23  Time Provider Notified 1815  Method of Notification Page  Notification Reason Critical Result (b/p 158/113)  Assess: SIRS CRITERIA  SIRS Temperature  0  SIRS Pulse 1  SIRS Respirations  1  SIRS WBC 0  SIRS Score Sum  2

## 2023-08-08 NOTE — Plan of Care (Signed)
  Problem: Acute Rehab OT Goals (only OT should resolve) Goal: Pt. Will Perform Grooming Flowsheets (Taken 08/08/2023 1036) Pt Will Perform Grooming:  with set-up  sitting Goal: Pt. Will Perform Upper Body Bathing Flowsheets (Taken 08/08/2023 1036) Pt Will Perform Upper Body Bathing:  with set-up  sitting Goal: Pt. Will Perform Upper Body Dressing Flowsheets (Taken 08/08/2023 1036) Pt Will Perform Upper Body Dressing:  with set-up  sitting Goal: Pt. Will Perform Lower Body Dressing Flowsheets (Taken 08/08/2023 1036) Pt Will Perform Lower Body Dressing:  with min assist  with adaptive equipment  sitting/lateral leans Goal: Pt. Will Transfer To Toilet Flowsheets (Taken 08/08/2023 1036) Pt Will Transfer to Toilet:  with contact guard assist  stand pivot transfer Goal: Pt. Will Perform Toileting-Clothing Manipulation Flowsheets (Taken 08/08/2023 1036) Pt Will Perform Toileting - Clothing Manipulation and hygiene:  sitting/lateral leans  with set-up Goal: Pt/Caregiver Will Perform Home Exercise Program Flowsheets (Taken 08/08/2023 1036) Pt/caregiver will Perform Home Exercise Program:  Increased strength  Both right and left upper extremity  Independently  Kelly Eisler OT, MOT

## 2023-08-08 NOTE — Progress Notes (Signed)
Physical Therapy Treatment Patient Details Name: Norma Barajas MRN: [id number] DOB: Feb 03, 1941 Today's Date: 08/08/2023   History of Present Illness Norma Barajas  is a 82 y.o. female, with a history of BPPV, chronic atrial fibrillation, hypertension, hyperlipidemia, diastolic CHF, GERD, with recent hospitalization last month secondary to vertigo, thought to be secondary to A-fib with RVR and BPPV, patient lives in [facility], patient sent by facility for a fall evaluation, patient usually ambulates with a walker, she does report vertigo intermittently, falls in the past, but none for a few month, patient reports vertigo and dizziness, both together when she was ambulating with a walker, she on the floor, she denies any loss of consciousness, head trauma, she complains of pain in pelvic area, denies any chest pain, shortness of breath, fever or chills.  .    PT Comments  Patient supine in bed upon therapist arrival. Patient and nursing reported patient up in chair for 1 hour this am and had just returned to bed. Patient agreeable to performing bed level exercises. Given verbal and gestural cues, patient used upper extremities and right lower extremity to boost up in bed. Performed bed level exercises with both lower extremities. Patient tolerated greater ROM, whether AROM or AAROM, than anticipated. At end of session, patient positioned in max upright position in bed with lunch and meal tray in front of her.  Patient would continue to benefit from skilled physical therapy in current environment and next venue to continue return to prior function and increase strength, endurance, balance, coordination, and functional mobility and gait skills.     If plan is discharge home, recommend the following: A lot of help with bathing/dressing/bathroom;A lot of help with walking and/or transfers;Help with stairs or ramp for entrance;Assistance with cooking/housework   Can travel by private vehicle     Yes   Equipment Recommendations  None recommended by PT    Recommendations for Other Services       Precautions / Restrictions Precautions Precautions: Fall Restrictions Weight Bearing Restrictions: Yes LLE Weight Bearing: Weight bearing as tolerated     Mobility  Bed Mobility Overal bed mobility: Needs Assistance     General bed mobility comments: Used UEs and RLE to boost up in bed demonstrating labored movement, multiple attempts, and extra time; verbal and gestural cues given    Transfers       General transfer comment: patient refused mobility; per patient and nurse report, patient sat in chair for an hour this morning    Ambulation/Gait       General Gait Details: patient refused mobility; per patient and nurse report, patient sat in chair for an hour this morning   Stairs             Wheelchair Mobility     Tilt Bed    Modified Rankin (Stroke Patients Only)       Balance                        Cognition Arousal: Alert Behavior During Therapy: WFL for tasks assessed/performed Overall Cognitive Status: Within Functional Limits for tasks assessed           Exercises General Exercises - Lower Extremity Gluteal Sets: Strengthening, Both, 10 reps, Supine Short Arc Quad: Strengthening, AROM, Right, AAROM, Left, 10 reps, Supine Heel Slides: Strengthening, AROM, Right, AAROM, Left, 10 reps, Supine Hip ABduction/ADduction: AAROM, Strengthening, Both, 10 reps, Supine Toe Raises: AROM, Strengthening, Both, 10 reps, Supine Heel Raises:  AROM, Strengthening, Both, 10 reps, Supine    General Comments        Pertinent Vitals/Pain Pain Assessment Pain Assessment: 0-10 Pain Score: 8  Pain Location: left hip/groin Pain Descriptors / Indicators: Grimacing, Guarding Pain Intervention(s): Limited activity within patient's tolerance, Monitored during session, Patient requesting pain meds-RN notified, Repositioned    Home Living Family/patient  expects to be discharged to:: Assisted living                 Home Equipment: Tub bench;Grab bars - tub/shower;Grab bars - toilet;Rollator (4 wheels);Rolling Walker (2 wheels);Cane - single point;BSC/3in1;Wheelchair - manual 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    Prior Function            PT Goals (current goals can now be found in the care plan section) Acute Rehab PT Goals Patient Stated Goal: return home after rehab PT Goal Formulation: With patient/family Time For Goal Achievement: 08/20/23 Potential to Achieve Goals: Good Progress towards PT goals: Progressing toward goals    Frequency    Min 3X/week      PT Plan         AM-PAC PT "6 Clicks" Mobility   Outcome Measure  Help needed turning from your back to your side while in a flat bed without using bedrails?: A Lot Help needed moving from lying on your back to sitting on the side of a flat bed without using bedrails?: A Lot Help needed moving to and from a bed to a chair (including a wheelchair)?: A Lot Help needed standing up from a chair using your arms (e.g., wheelchair or bedside chair)?: A Lot Help needed to walk in hospital room?: A Lot Help needed climbing 3-5 steps with a railing? : Total 6 Click Score: 11    End of Session   Activity Tolerance: Patient tolerated treatment well;Patient limited by pain Patient left: with call bell/phone within reach;in bed;with bed alarm set Nurse Communication: Mobility status;Patient requests pain meds PT Visit Diagnosis: Unsteadiness on feet (R26.81);Other abnormalities of gait and mobility (R26.89);Muscle weakness (generalized) (M62.81)     Time: 6063-0160 PT Time Calculation (min) (ACUTE ONLY): 19 min  Charges:    $Therapeutic Exercise: 8-22 mins PT General Charges $$ ACUTE PT VISIT: 1 Visit                     Katina Dung. Hartnett-Rands, MS, PT [facility] PT [facility] [place] [phone]  [name]  Hartnett-Rands 08/08/2023, 1:16  PM

## 2023-08-08 NOTE — ED Provider Notes (Signed)
[facility] MEDICAL SURGICAL UNIT Provider Note   CSN: [id number] Arrival date & time: 08/05/23  1201     History  Chief Complaint  Patient presents with   Dizziness   Fall    Norma Barajas is a 82 y.o. female.  Presented after fall with hip pain. H/o of afib, on metoprolol. Felt off balance while walking with vertigo symptoms. Chronic vertigo. Fell back and lowered self somewhat down to ground. Did not hit head. No LOC. Complains of pain in right hip.    Dizziness Fall       Home Medications Prior to Admission medications   Medication Sig Start Date End Date Taking? Authorizing Provider  acetaminophen (TYLENOL) 500 MG tablet Take 2 tablets (1,000 mg total) by mouth every 6 (six) hours as needed for mild pain. 05/04/23  Yes Rakes, Doralee Albino, FNP  amLODipine (NORVASC) 2.5 MG tablet Take 2.5 mg by mouth daily.   Yes [provider]  ASPIRIN LOW DOSE 81 MG tablet TAKE 1 TABLET TWICE DAILY 06/23/22  Yes Deliah Boston F, FNP  bisacodyl (DULCOLAX) 10 MG suppository Place 1 suppository (10 mg total) rectally as needed for moderate constipation. 07/06/23  Yes Rakes, Doralee Albino, FNP  cholecalciferol (VITAMIN D3) 25 MCG (1000 UNIT) tablet Take 1,000 Units by mouth in the morning.   Yes [provider]  diltiazem (CARDIZEM) 30 MG tablet Take 1 tablet (30 mg total) by mouth every 12 (twelve) hours as needed (For HR >>> 120 BPM). 05/19/23  Yes Shon Hale, MD  meclizine (ANTIVERT) 25 MG tablet TAKE 1 TABLET THREE TIMES DAILY AS NEEDED FOR DIZZINESS 07/27/23  Yes Rakes, Doralee Albino, FNP  metoprolol (TOPROL XL) 200 MG 24 hr tablet Take 1 tablet (200 mg total) by mouth every evening. 06/22/23 06/21/24 Yes Rakes, Doralee Albino, FNP  omeprazole (PRILOSEC) 20 MG capsule TAKE 1 CAPSULE EVERY DAY 07/25/23  Yes Rakes, Doralee Albino, FNP  potassium chloride (KLOR-CON) 10 MEQ tablet Take 1 tablet (10 mEq total) by mouth daily. Take While taking Demadex/Torsemide 05/19/23  Yes Shon Hale, MD   simvastatin (ZOCOR) 20 MG tablet TAKE 1 TABLET EVERY DAY 08/01/23  Yes Rakes, Doralee Albino, FNP  torsemide (DEMADEX) 20 MG tablet Take 1 tablet (20 mg total) by mouth 2 (two) times daily. Breakfast and lunch 06/22/23  Yes Rakes, Doralee Albino, FNP  traZODone (DESYREL) 50 MG tablet Take 1 tablet (50 mg total) by mouth at bedtime. 05/19/23  Yes Shon Hale, MD      Allergies    Codeine, Doxycycline, Triamterene-hctz, Atorvastatin, Crestor [rosuvastatin calcium], Morphine, and Risedronate sodium    Review of Systems   Review of Systems  Neurological:  Positive for dizziness.    Physical Exam Updated Vital Signs BP (!) 144/92 (BP Location: Right Arm)   Pulse 86   Temp 97.8 F (36.6 C) (Oral)   Resp 17   Ht 4\' 8"  (1.422 m)   Wt 50 kg   SpO2 97%   BMI 24.71 kg/m  Physical Exam Vitals and nursing note reviewed.  Constitutional:      General: She is not in acute distress. HENT:     Head: Normocephalic.     Mouth/Throat:     Mouth: Mucous membranes are moist.  Eyes:     Conjunctiva/sclera: Conjunctivae normal.  Cardiovascular:     Rate and Rhythm: Tachycardia present. Rhythm irregular.     Pulses: Normal pulses.  Pulmonary:     Effort: Pulmonary effort is normal.  Breath sounds: Normal breath sounds.  Abdominal:     General: There is no distension.     Tenderness: There is no abdominal tenderness. There is no guarding or rebound.  Musculoskeletal:        General: Normal range of motion.     Comments: Tender to left hip; otherwise no focal tenderness. Neuro intact in all extremities.   Skin:    General: Skin is warm and dry.     Capillary Refill: Capillary refill takes less than 2 seconds.  Neurological:     Mental Status: She is alert and oriented to person, place, and time.  Psychiatric:        Mood and Affect: Mood normal.        Behavior: Behavior normal.     ED Results / Procedures / Treatments   Labs (all labs ordered are listed, but only abnormal results are  displayed) Labs Reviewed  CBC - Abnormal; Notable for the following components:      Result Value   Hemoglobin 16.0 (*)    HCT 48.4 (*)    Platelets 130 (*)    All other components within normal limits  BASIC METABOLIC PANEL - Abnormal; Notable for the following components:   Chloride 94 (*)    Glucose, Bld 159 (*)    BUN 27 (*)    Creatinine, Ser 1.16 (*)    Calcium 8.8 (*)    GFR, Estimated 46 (*)    All other components within normal limits  BASIC METABOLIC PANEL - Abnormal; Notable for the following components:   Chloride 95 (*)    Calcium 8.7 (*)    All other components within normal limits  CBC - Abnormal; Notable for the following components:   WBC 11.0 (*)    Platelets 130 (*)    All other components within normal limits  RENAL FUNCTION PANEL - Abnormal; Notable for the following components:   Sodium 133 (*)    Chloride 95 (*)    Calcium 8.5 (*)    Albumin 3.1 (*)    All other components within normal limits  CBC - Abnormal; Notable for the following components:   Platelets 99 (*)    All other components within normal limits  BRAIN NATRIURETIC PEPTIDE - Abnormal; Notable for the following components:   B Natriuretic Peptide 723.0 (*)    All other components within normal limits  RENAL FUNCTION PANEL - Abnormal; Notable for the following components:   Sodium 134 (*)    Chloride 95 (*)    Glucose, Bld 104 (*)    Calcium 8.6 (*)    Albumin 3.1 (*)    All other components within normal limits  CBC - Abnormal; Notable for the following components:   Platelets 100 (*)    All other components within normal limits  MRSA NEXT GEN BY PCR, NASAL  MAGNESIUM  MAGNESIUM  MAGNESIUM  MAGNESIUM    EKG EKG Interpretation Date/Time:  Friday August 05 2023 12:21:32 EDT Ventricular Rate:  133 PR Interval:    QRS Duration:  64 QT Interval:  321 QTC Calculation: 513 R Axis:   80  Text Interpretation: Atrial fibrillation with rapid V-rate Repolarization abnormality,  prob rate related Confirmed by Estanislado Pandy [phone]) on 08/05/2023 12:26:43 PM  Radiology DG Chest Port 1 View  Result Date: 08/07/2023 CLINICAL DATA:  Shortness of breath. EXAM: PORTABLE CHEST 1 VIEW COMPARISON:  08/05/2023 FINDINGS: The cardio pericardial silhouette is enlarged. Interstitial markings are diffusely coarsened with chronic  features. Subtle atelectasis or infiltrate at the right base is minimally progressive. Possible tiny left pleural effusion. Bones are diffusely demineralized. Telemetry leads overlie the chest. IMPRESSION: Chronic interstitial coarsening with minimally progressive atelectasis or infiltrate at the right base. Electronically Signed   By: Kennith Center M.D.   On: 08/07/2023 17:20    Procedures Procedures    Medications Ordered in ED Medications  enoxaparin (LOVENOX) injection 30 mg (30 mg Subcutaneous Given 08/07/23 2107)  methocarbamol (ROBAXIN) 500 mg in dextrose 5 % 50 mL IVPB (has no administration in time range)  bisacodyl (DULCOLAX) suppository 10 mg (has no administration in time range)  albuterol (PROVENTIL) (2.5 MG/3ML) 0.083% nebulizer solution 2.5 mg (has no administration in time range)  hydrALAZINE (APRESOLINE) injection 5 mg (has no administration in time range)  Chlorhexidine Gluconate Cloth 2 % PADS 6 each (6 each Topical Given 08/08/23 0516)  cyanocobalamin (VITAMIN B12) injection 1,000 mcg (1,000 mcg Subcutaneous Patient Refused/Not Given 08/07/23 0853)  aspirin EC tablet 81 mg (81 mg Oral Given 08/07/23 2108)  pantoprazole (PROTONIX) EC tablet 40 mg (40 mg Oral Given 08/07/23 0853)  metoprolol tartrate (LOPRESSOR) injection 2.5 mg (has no administration in time range)  acetaminophen (TYLENOL) tablet 650 mg (650 mg Oral Not Given 08/08/23 0113)  oxyCODONE (Oxy IR/ROXICODONE) immediate release tablet 5 mg (5 mg Oral Given 08/07/23 2008)  ketorolac (TORADOL) 15 MG/ML injection 15 mg (15 mg Intravenous Given 08/07/23 0623)  traZODone (DESYREL) tablet 50  mg (50 mg Oral Given 08/07/23 2108)  metoprolol succinate (TOPROL-XL) 24 hr tablet 100 mg (100 mg Oral Given 08/07/23 2014)  feeding supplement (BOOST / RESOURCE BREEZE) liquid 1 Container (1 Container Oral Given 08/07/23 2012)  senna-docusate (Senokot-S) tablet 2 tablet (2 tablets Oral Patient Refused/Not Given 08/07/23 2013)    Followed by  senna-docusate (Senokot-S) tablet 1 tablet (has no administration in time range)  polyethylene glycol (MIRALAX / GLYCOLAX) packet 17 g (17 g Oral Not Given 08/07/23 2013)    Followed by  polyethylene glycol (MIRALAX / GLYCOLAX) packet 17 g (has no administration in time range)  multivitamins with iron tablet 1 tablet (1 tablet Oral Given 08/07/23 1159)  lactated ringers bolus 500 mL (0 mLs Intravenous Stopped 08/05/23 1314)  acetaminophen (TYLENOL) tablet 650 mg (650 mg Oral Given 08/05/23 1251)  sodium chloride 0.9 % bolus 500 mL (0 mLs Intravenous Stopped 08/05/23 1439)  digoxin (LANOXIN) 0.25 MG/ML injection 0.25 mg (0.25 mg Intravenous Given 08/05/23 1606)  potassium chloride SA (KLOR-CON M) CR tablet 60 mEq (60 mEq Oral Given 08/06/23 0839)  furosemide (LASIX) injection 20 mg (20 mg Intravenous Given 08/07/23 1320)    ED Course/ Medical Decision Making/ A&P Clinical Course as of 08/08/23 0836  Fri Aug 05, 2023  1247 Patient A-fib RVR heart rate in 120s to 130s.  Seemingly asymptomatic.  Will trial pain medications and IV fluids to see if helps heart rate.  Reportedly takes metoprolol and has not missed any doses.  Takes 200 mg at night. [TY]  1401 Heart rate in the low 100s currently [TY]    Clinical Course User Index [TY] Coral Spikes, DO                                 Medical Decision Making 82 year old presented emergency department after a fall.  Patient A-fib RVR heart rate in the 130s.  Given IV fluids and Tylenol with improvement of  heart rate down to the 100s initially.  Oxygen saturation borderline, placed on 2 L of oxygen for comfort.   Physical exam with some pain to her left hip, otherwise no obvious signs of trauma.  Basic labs reassuring no metabolic derangements.  Minor white count.  Independent review of pelvis x-ray looks to have pubic rami fracture.  Case discussed with Ortho recommending pain control, mobilization with PT and weightbearing as tolerated.  Amount and/or Complexity of Data Reviewed Independent Historian:     Details: Daughter notes that patient taking metoprolol for A-fib External Data Reviewed:     Details: Not on anticoagulation per chart review due to frequent falls. Labs: ordered. Radiology: ordered. ECG/medicine tests: ordered.  Risk OTC drugs. Decision regarding hospitalization. Diagnosis or treatment significantly limited by social determinants of health. Risk Details: Given patient's advanced age, and injury pattern for that she is at high risk for decompensation.  Will admit for pain control, physical therapy as well as further rate control of her A-fib          Final Clinical Impression(s) / ED Diagnoses Final diagnoses:  None    Rx / DC Orders ED Discharge Orders     None         Coral Spikes, DO 08/08/23 [phone]

## 2023-08-08 NOTE — Progress Notes (Signed)
[facility] PROGRESS NOTE  Norma Barajas (DOB: [birth year]) ZOX:[id number] PCP: Sonny Masters, FNP  Brief Narrative: Norma Barajas is a 82 y.o. female with PMH of diastolic CHF, A-fib not on AC due to high fall risk, severe TR CKD-3A, BPPV/vertigo, HTN, HLD and GERD brought to ED from [facility] due to dizziness and fall at ALF, and admitted for A-fib with RVR and pelvic fractures including left superior and inferior pubic rami fractures and possible left sacral fracture.    Pain improving.  RVR resolved.  Therapy recommended SNF.  Subjective: No new complaints, though she says she gets shortwinded with extended conversation which is new. No CP, palpitations. Her groin/hip hurts still but overall stable and satisfactory improvement.  Objective: BP (!) 140/84 (BP Location: Right Wrist)   Pulse 94   Temp (!) 97.5 F (36.4 C) (Oral)   Resp 17   Ht 4\' 8"  (1.422 m)   Wt 50 kg   SpO2 93%   BMI 24.71 kg/m   Gen: Elderly frail female in no acute distress Pulm: No crackles or wheezes, nonlabored  CV: Irreg Irreg +Systolic murmur at LSB, +edema, +JVD GI: Soft, NT, ND, +BS  Neuro: Alert and oriented. No new focal deficits. Ext: Warm, no deformities. Skin: No new rashes, lesions or ulcers on visualized skin   Assessment & Plan: Permanent atrial fibrillation with RVR: Patient with known history of chronic A-fib.  Had DCCV in the past.  Per cardiology note in 7/24, he refused additional DCCV's.  Reportedly failed sotalol.  Not on AC due to high fall risk.  TTE in 6/24 with LVEF of 55% and RVSP of 30 mmHg, severe LAE and RAE and severe TR.  HR up to 130s while in ED but improved with IV Cardizem pushes and Toprol-XL.  It seems like she is on Toprol-XL 200 mg daily with as needed Cardizem.  Continues to have intermittent RVR. -Toprol-XL 100 mg twice daily -Discontinue p.o. Cardizem -IV metoprolol 2.5 mg every 4 hours as needed sustained HR > 120. -Optimize electrolytes -May consider  cardiology consult if heart failure persists.   Severe tricuspid valve regurgitation: Noted on echocardiogram on 04/2023. -Outpatient follow-up with cardiology   Pelvic fracture due to fall at ALF: Patient with known history of vertigo/BPPV.  She was also in A-fib with RVR.  Reportedly hypotensive.  No report of head trauma or LOC.  Pelvic x-ray with left superior and inferior pubic rami fracture and possible sacral fracture.  No focal neurosymptoms on exam.  No nystagmus. - WBAT, pain control, nonoperative management per orthopedics, Dr. Dallas Schimke. -Scheduled Tylenol with as needed Toradol, Robaxin and oxycodone -Increase bowel regimen -PT/OT-recommended SNF. Pursuing this.  -Fall precaution   Hypotension/essential hypertension: Hypotension resolved. - Now growing more hypertensive again, will restart home torsemide   Stage IIIb CKD:  Baseline Cr ~0.7-0.8.  Renal function improved to baseline, would be consistent with stage IIIb.  -Continue monitoring as we restart diuretic   Chronic HFpEF: She reports some shortness of breath but appears euvolemic. -Chronic interstitial coarsening on CXR with mild R base opacity. More consistent with atelectasis at this time.  - Restart home torsemide as she also has JVD.   Hyperlipidemia -Continue statin   GERD -Continue pantoprazole   Decreased oral intake  Tyrone Nine, MD [facility] [email or web address] 08/08/2023, 5:35 PM

## 2023-08-08 NOTE — TOC Progression Note (Signed)
Transition of Care [facility]) - Progression Note    Patient Details  Name: Norma Barajas MRN: [id number] Date of Birth: 1941-05-22  Transition of Care [facility]) CM/SW Contact  Villa Herb, [place] Phone Number: 08/08/2023, 10:40 AM  Clinical Narrative:    CSW spoke with pt to review bed offers. Pt states she would like to accept the bed offer at [facility]. CSW updated Kerri in admissions with [facility]. Insurance Berkley Harvey has been started for SNF at [facility], it is pending at this time. TOC to follow.   Expected Discharge Plan: Skilled Nursing Facility Barriers to Discharge: Continued Medical Work up  Expected Discharge Plan and Services In-house Referral: Clinical Social Work   Post Acute Care Choice: Skilled Nursing Facility Living arrangements for the past 2 months: Single Family Home                                       Social Determinants of Health (SDOH) Interventions SDOH Screenings   Food Insecurity: No Food Insecurity (08/05/2023)  Housing: Low Risk  (08/05/2023)  Transportation Needs: No Transportation Needs (08/05/2023)  Utilities: Not At Risk (08/05/2023)  Depression (PHQ2-9): Low Risk  (05/27/2023)  Financial Resource Strain: Low Risk  (07/04/2023)  Physical Activity: Unknown (07/04/2023)  Social Connections: Moderately Isolated (07/04/2023)  Stress: Stress Concern Present (07/04/2023)  Tobacco Use: Low Risk  (08/05/2023)    Readmission Risk Interventions    03/26/2021   12:33 PM 03/25/2021    2:59 PM  Readmission Risk Prevention Plan  Medication Screening  Complete  Transportation Screening  Complete  PCP or Specialist Appt within 5-7 Days Complete   Home Care Screening Complete   Medication Review (RN CM) Complete

## 2023-08-08 NOTE — Evaluation (Signed)
Occupational Therapy Evaluation Patient Details Name: Norma Barajas MRN: [id number] DOB: [birth year] Today's Date: 08/08/2023   History of Present Illness Norma Barajas  is a 82 y.o. female, with a history of BPPV, chronic atrial fibrillation, hypertension, hyperlipidemia, diastolic CHF, GERD, with recent hospitalization last month secondary to vertigo, thought to be secondary to A-fib with RVR and BPPV, patient lives in [facility], patient sent by facility for a fall evaluation, patient usually ambulates with a walker, she does report vertigo intermittently, falls in the past, but none for a few month, patient reports vertigo and dizziness, both together when she was ambulating with a walker, she on the floor, she denies any loss of consciousness, head trauma, she complains of pain in pelvic area, denies any chest pain, shortness of breath, fever or chills. (Per MD)   Clinical Impression   Pt agreeable to OT evaluation. Pt reports pain in L groin area. Pt reports assist at baseline for bathing and dressing. Today pt required mod A for bed mobility and for stand pivot to the chair. Pt required much extended time for this transfer as well as physical assist to manage RW. Pt removed from supplemental O2 during the session and tended to sat between mid to high 80s and low 90s. Pt placed back on 1 LPM supplemental O2 at the end of session with saturation around 96%. Pt was left in the chair with chair alarm set and call bell within reach. Pt will benefit from continued OT in the hospital and recommended venue below to increase strength, balance, and endurance for safe ADL's.          If plan is discharge home, recommend the following: A lot of help with walking and/or transfers;A lot of help with bathing/dressing/bathroom;Assistance with cooking/housework;Assist for transportation;Help with stairs or ramp for entrance    Functional Status Assessment  Patient has had a recent decline in their functional  status and demonstrates the ability to make significant improvements in function in a reasonable and predictable amount of time.  Equipment Recommendations  None recommended by OT           Precautions / Restrictions Precautions Precautions: Fall Restrictions Weight Bearing Restrictions: Yes LLE Weight Bearing: Weight bearing as tolerated      Mobility Bed Mobility Overal bed mobility: Needs Assistance Bed Mobility: Supine to Sit     Supine to sit: Mod assist     General bed mobility comments: increased time, labored movement, diffiuclty moving LLE    Transfers Overall transfer level: Needs assistance Equipment used: Rolling walker (2 wheels) Transfers: Sit to/from Stand, Bed to chair/wheelchair/BSC Sit to Stand: Mod assist     Step pivot transfers: Mod assist     General transfer comment: increased time and assist to pivot with RW; difficulty moving L LE and bearign weight in L LE.      Balance Overall balance assessment: Needs assistance Sitting-balance support: Feet supported, No upper extremity supported Sitting balance-Leahy Scale: Fair Sitting balance - Comments: seated at EOB   Standing balance support: Reliant on assistive device for balance, During functional activity, Bilateral upper extremity supported Standing balance-Leahy Scale: Poor Standing balance comment: using RW                           ADL either performed or assessed with clinical judgement   ADL Overall ADL's : Needs assistance/impaired     Grooming: Minimal assistance;Sitting   Upper Body Bathing: Minimal assistance;Sitting;Moderate  assistance   Lower Body Bathing: Maximal assistance;Sitting/lateral leans   Upper Body Dressing : Minimal assistance;Moderate assistance;Sitting   Lower Body Dressing: Maximal assistance;Sitting/lateral leans   Toilet Transfer: Moderate assistance;Rolling walker (2 wheels);Stand-pivot Statistician Details (indicate cue type and  reason): Simulated via EOB to chair transfer with RW Toileting- Clothing Manipulation and Hygiene: Moderate assistance;Sitting/lateral lean               Vision Baseline Vision/History: 1 Wears glasses Ability to See in Adequate Light: 1 Impaired Patient Visual Report: Other (comment) (Reports vertigo that causes dizziness) Vision Assessment?: No apparent visual deficits     Perception Perception: Not tested       Praxis Praxis: Not tested       Pertinent Vitals/Pain Pain Assessment Pain Assessment: Faces Faces Pain Scale: Hurts even more Pain Location: left hip/groin area; low back Pain Descriptors / Indicators: Grimacing, Guarding Pain Intervention(s): Limited activity within patient's tolerance, Monitored during session, Repositioned     Extremity/Trunk Assessment Upper Extremity Assessment Upper Extremity Assessment: Generalized weakness   Lower Extremity Assessment Lower Extremity Assessment: Defer to PT evaluation   Cervical / Trunk Assessment Cervical / Trunk Assessment: Normal   Communication Communication Communication: Hearing impairment   Cognition Arousal: Alert Behavior During Therapy: WFL for tasks assessed/performed Overall Cognitive Status: Within Functional Limits for tasks assessed                                                        Home Living Family/patient expects to be discharged to:: Assisted living                             Home Equipment: Tub bench;Grab bars - tub/shower;Grab bars - toilet;Rollator (4 wheels);Rolling Walker (2 wheels);Cane - single point;BSC/3in1;Wheelchair - manual   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 Assist for bathing, dressing, and IADL's.        OT Problem List: Decreased strength;Decreased range of motion;Decreased activity tolerance;Impaired balance (sitting and/or standing)      OT Treatment/Interventions: Self-care/ADL training;Therapeutic exercise;Therapeutic activities;Patient/family education;Balance training    OT Goals(Current goals can be found in the care plan section) Acute Rehab OT Goals Patient Stated Goal: return to ALF OT Goal Formulation: With patient Time For Goal Achievement: 08/22/23 Potential to Achieve Goals: Good  OT Frequency: Min 2X/week                                   End of Session Equipment Utilized During Treatment: Rolling walker (2 wheels);Oxygen Nurse Communication: Other (comment) (Notified pt was seated in the chair.)  Activity Tolerance: Patient tolerated treatment well Patient left: in chair;with call bell/phone within reach;with chair alarm set  OT Visit Diagnosis: Unsteadiness on feet (R26.81);Other abnormalities of gait and mobility (R26.89);Muscle weakness (generalized) (M62.81);History of falling (Z91.81)                Time: 2536-6440 OT Time Calculation (min): 27 min Charges:  OT  General Charges $OT Visit: 1 Visit OT Evaluation $OT Eval Low Complexity: 1 Low  Thomas Mabry OT, MOT   Danie Chandler 08/08/2023, 10:31 AM

## 2023-08-09 ENCOUNTER — Ambulatory Visit: Payer: Medicare HMO | Admitting: Family Medicine

## 2023-08-09 DIAGNOSIS — S3282XA Multiple fractures of pelvis without disruption of pelvic ring, initial encounter for closed fracture: Secondary | ICD-10-CM | POA: Diagnosis not present

## 2023-08-09 MED ORDER — INFLUENZA VAC A&B SURF ANT ADJ 0.5 ML IM SUSY
0.5000 mL | PREFILLED_SYRINGE | INTRAMUSCULAR | Status: DC
  Filled 2023-08-09: qty 0.5

## 2023-08-09 MED ORDER — METOPROLOL SUCCINATE ER 50 MG PO TB24
150.0000 mg | ORAL_TABLET | Freq: Two times a day (BID) | ORAL | Status: DC
  Administered 2023-08-09 – 2023-08-10 (×3): 150 mg via ORAL
  Filled 2023-08-09 (×3): qty 3

## 2023-08-09 MED ORDER — ORAL CARE MOUTH RINSE: 15.0000 mL | OROMUCOSAL | Status: DC | PRN

## 2023-08-09 MED ORDER — MECLIZINE HCL 12.5 MG PO TABS: 25.0000 mg | ORAL_TABLET | Freq: Three times a day (TID) | ORAL | Status: DC | PRN

## 2023-08-09 NOTE — Progress Notes (Signed)
Mobility Specialist Progress Note:    08/09/23 1330  Mobility  Activity Transferred from bed to chair  Level of Assistance Maximum assist, patient does 25-49%  Assistive Device Front wheel walker  Distance Ambulated (ft) 4 ft  Range of Motion/Exercises Active;Right arm;Left arm;Right leg;Active Assistive;Left leg  LLE Weight Bearing TWB  Activity Response Tolerated well  Mobility Referral Yes  $Mobility charge 1 Mobility  Mobility Specialist Start Time (ACUTE ONLY) 1330  Mobility Specialist Stop Time (ACUTE ONLY) 1345  Mobility Specialist Time Calculation (min) (ACUTE ONLY) 15 min   Pt received in bed, agreeable to mobility. Required MaxA to stand and transfer to chair with RW. Tolerated well, LLE TWB. Left pt in chair, alarm on. Call bell in reach, all needs met.   Lawerance Bach Mobility Specialist Please contact via Special educational needs teacher or  Rehab office at [phone]

## 2023-08-09 NOTE — Plan of Care (Signed)
  Problem: Activity: Goal: Risk for activity intolerance will decrease Outcome: Progressing   

## 2023-08-09 NOTE — Progress Notes (Signed)
Nurse at bedside patient out of bed to chair this afternoon. Call bell in reach,chair alarm on. Plan of care on going.

## 2023-08-09 NOTE — Progress Notes (Addendum)
[facility] PROGRESS NOTE  Norma Barajas (DOB: 1941-01-07) ZOX:[id number] PCP: Sonny Masters, FNP  Brief Narrative: Norma Barajas is a 82 y.o. female with PMH of diastolic CHF, A-fib not on AC due to high fall risk, severe TR CKD-3A, BPPV/vertigo, HTN, HLD and GERD brought to ED from [facility] due to dizziness and fall at ALF, and admitted for A-fib with RVR and pelvic fractures including left superior and inferior pubic rami fractures and possible left sacral fracture.    Pain improving. Therapy recommended SNF. Intermittent bouts of AFib with RVR still delaying discharge.  Subjective: Feeling pain that is stable about the left pelvis, no changes. Has not been getting around much at all, feels worn out. No chest pain. When she speaks for a long time feels short of breath. No associated symptoms with RVR last night.  Objective: BP (!) 139/95 (BP Location: Right Arm)   Pulse 94   Temp 97.7 F (36.5 C) (Oral)   Resp 18   Ht 4\' 8"  (1.422 m)   Wt 49.7 kg   SpO2 97%   BMI 24.56 kg/m   Gen: Elderly frail female in no distress Pulm: No crackles or wheezes. Diminished throughout most pronounced at bases  CV: Irreg irreg, rate in 90's. Telemetry shows rates into 120's. Stable systolic murmur at LSB GI: Soft, NT, ND, +BS  Neuro: Alert and oriented. No new focal deficits. Ext: Warm, no deformities Skin: No rashes, lesions or ulcers on visualized skin   Assessment & Plan: Permanent atrial fibrillation with RVR: Patient with known history of chronic A-fib.  Had DCCV in the past.  Per cardiology note in 7/24, he refused additional DCCV's.  Reportedly failed sotalol.  Not on AC due to high fall risk.  TTE in 6/24 with LVEF of 55% and RVSP of 30 mmHg, severe LAE and RAE and severe TR.  HR up to 130s while in ED but improved with IV Cardizem pushes and Toprol-XL.  It seems like she is on Toprol-XL 200 mg daily with as needed Cardizem.  Continues to have intermittent RVR. - Continue metoprolol  succinate, increase from 100mg  BID to 150mg  BID.  -IV metoprolol 2.5 mg every 4 hours as needed sustained HR > 120. Could alternatively give diltiazem po prn. -Optimize electrolytes -May consider cardiology consult if heart failure persists.   Acute hypoxic respiratory failure: Has been maintained on 2L O2, CXR suggests atelectasis.  - Urged to mobilize more. D/w pt and RN to instruct and encourage use of incentive spirometry and wean to room air.   Severe tricuspid valve regurgitation: Noted on echocardiogram on 04/2023. -Outpatient follow-up with cardiology   Pelvic fracture due to fall at ALF: Patient with known history of vertigo/BPPV.  She was also in A-fib with RVR.  Reportedly hypotensive.  No report of head trauma or LOC.  Pelvic x-ray with left superior and inferior pubic rami fracture and possible sacral fracture.  No focal neurosymptoms on exam.  No nystagmus. - WBAT, pain control, nonoperative management per orthopedics, Dr. Dallas Schimke.  - Scheduled Tylenol with as needed Toradol, Robaxin and oxycodone (with bowel regimen) - PT/OT-recommended SNF. Pursuing this.  - Fall precaution   Hypotension/essential hypertension: Hypotension resolved. Restarted torsemide   Stage IIIb CKD:  Baseline Cr ~0.7-0.8.  Renal function improved to baseline, would be consistent with stage IIIb.  -Continue monitoring as we restart diuretic   Chronic HFpEF: She reports some shortness of breath but appears euvolemic. -Chronic interstitial coarsening on CXR with  mild R base opacity. More consistent with atelectasis at this time.  - BNP elevated (at 723 from a baseline in 500's), had JVD so restarted home torsemide.  Hyperlipidemia -Continue statin   GERD -Continue pantoprazole   Decreased oral intake  Thrombocytopenia:  - Chronic, no intervention currently planned.  Tyrone Nine, MD [facility] [email or web address] 08/09/2023, 12:46 PM

## 2023-08-09 NOTE — Plan of Care (Signed)
  Problem: Health Behavior/Discharge Planning: Goal: Ability to manage health-related needs will improve Outcome: Progressing   Problem: Activity: Goal: Risk for activity intolerance will decrease Outcome: Progressing   Problem: Coping: Goal: Level of anxiety will decrease Outcome: Progressing   Problem: Pain Managment: Goal: General experience of comfort will improve Outcome: Progressing   Problem: Safety: Goal: Ability to remain free from injury will improve Outcome: Progressing   

## 2023-08-10 DIAGNOSIS — N1831 Chronic kidney disease, stage 3a: Secondary | ICD-10-CM | POA: Diagnosis not present

## 2023-08-10 DIAGNOSIS — S3282XD Multiple fractures of pelvis without disruption of pelvic ring, subsequent encounter for fracture with routine healing: Secondary | ICD-10-CM | POA: Diagnosis not present

## 2023-08-10 DIAGNOSIS — I5032 Chronic diastolic (congestive) heart failure: Secondary | ICD-10-CM | POA: Diagnosis not present

## 2023-08-10 DIAGNOSIS — S3282XG Multiple fractures of pelvis without disruption of pelvic ring, subsequent encounter for fracture with delayed healing: Secondary | ICD-10-CM | POA: Diagnosis not present

## 2023-08-10 DIAGNOSIS — E782 Mixed hyperlipidemia: Secondary | ICD-10-CM | POA: Diagnosis not present

## 2023-08-10 DIAGNOSIS — I110 Hypertensive heart disease with heart failure: Secondary | ICD-10-CM | POA: Diagnosis not present

## 2023-08-10 DIAGNOSIS — I361 Nonrheumatic tricuspid (valve) insufficiency: Secondary | ICD-10-CM | POA: Diagnosis not present

## 2023-08-10 DIAGNOSIS — K219 Gastro-esophageal reflux disease without esophagitis: Secondary | ICD-10-CM | POA: Diagnosis not present

## 2023-08-10 DIAGNOSIS — S3282XA Multiple fractures of pelvis without disruption of pelvic ring, initial encounter for closed fracture: Secondary | ICD-10-CM | POA: Diagnosis not present

## 2023-08-10 DIAGNOSIS — I5082 Biventricular heart failure: Secondary | ICD-10-CM | POA: Diagnosis not present

## 2023-08-10 DIAGNOSIS — I10 Essential (primary) hypertension: Secondary | ICD-10-CM | POA: Diagnosis not present

## 2023-08-10 DIAGNOSIS — I4891 Unspecified atrial fibrillation: Secondary | ICD-10-CM | POA: Diagnosis not present

## 2023-08-10 DIAGNOSIS — I480 Paroxysmal atrial fibrillation: Secondary | ICD-10-CM | POA: Diagnosis not present

## 2023-08-10 DIAGNOSIS — D519 Vitamin B12 deficiency anemia, unspecified: Secondary | ICD-10-CM | POA: Diagnosis not present

## 2023-08-10 DIAGNOSIS — I700 Atherosclerosis of aorta: Secondary | ICD-10-CM | POA: Diagnosis not present

## 2023-08-10 LAB — BASIC METABOLIC PANEL
Anion gap: 10 (ref 5–15)
BUN: 23 mg/dL (ref 8–23)
CO2: 33 mmol/L — ABNORMAL HIGH (ref 22–32)
Calcium: 8.5 mg/dL — ABNORMAL LOW (ref 8.9–10.3)
Chloride: 91 mmol/L — ABNORMAL LOW (ref 98–111)
Creatinine, Ser: 0.86 mg/dL (ref 0.44–1.00)
GFR, Estimated: >60 mL/min (ref >60)
Glucose, Bld: 101 mg/dL — ABNORMAL HIGH (ref 70–99)
Potassium: 3.3 mmol/L — ABNORMAL LOW (ref 3.5–5.1)
Sodium: 134 mmol/L — ABNORMAL LOW (ref 135–145)

## 2023-08-10 MED ORDER — OXYCODONE HCL 5 MG PO TABS: 5.0000 mg | ORAL_TABLET | Freq: Four times a day (QID) | ORAL | 0 refills | Status: DC | PRN

## 2023-08-10 MED ORDER — VITAMIN B-12 1000 MCG PO TABS: 1000.0000 ug | ORAL_TABLET | Freq: Every day | ORAL | Status: DC

## 2023-08-10 MED ORDER — METOPROLOL SUCCINATE ER 100 MG PO TB24: 150.0000 mg | ORAL_TABLET | Freq: Two times a day (BID) | ORAL | Status: DC

## 2023-08-10 MED ORDER — POLYETHYLENE GLYCOL 3350 17 G PO PACK: 17.0000 g | PACK | Freq: Two times a day (BID) | ORAL | Status: DC | PRN

## 2023-08-10 MED ORDER — SENNOSIDES-DOCUSATE SODIUM 8.6-50 MG PO TABS: 1.0000 | ORAL_TABLET | Freq: Two times a day (BID) | ORAL | Status: DC

## 2023-08-10 MED ORDER — POLYETHYLENE GLYCOL 3350 17 G PO PACK
17.0000 g | PACK | Freq: Once | ORAL | Status: AC
  Administered 2023-08-10: 17 g via ORAL

## 2023-08-10 MED ORDER — POTASSIUM CHLORIDE CRYS ER 20 MEQ PO TBCR
40.0000 meq | EXTENDED_RELEASE_TABLET | Freq: Once | ORAL | Status: AC
  Administered 2023-08-10: 40 meq via ORAL
  Filled 2023-08-10: qty 2

## 2023-08-10 MED ORDER — POLYETHYLENE GLYCOL 3350 17 G PO PACK
17.0000 g | PACK | Freq: Two times a day (BID) | ORAL | Status: DC | PRN
  Administered 2023-08-10: 17 g via ORAL

## 2023-08-10 NOTE — TOC Transition Note (Signed)
Transition of Care [facility]) - CM/SW Discharge Note   Patient Details  Name: Norma Barajas MRN: [id number] Date of Birth: Oct 27, 1941  [facility] CM/SW Contact:  Villa Herb, LCSWA Phone Number: 08/10/2023, 2:36 PM   Clinical Narrative:    CSW updated pt is medically ready for D/C to SNF at [facility] today. CSW spoke with Lynnea Ferrier in admissions who states they are ready to accept. CSW updated RN to call report once D/C summary in place. TOC to signing off.   Final next level of care: Skilled Nursing Facility Barriers to Discharge: Barriers Resolved   Patient Goals and CMS Choice CMS Medicare.gov Compare Post Acute Care list provided to:: Patient Choice offered to / list presented to : Patient  Discharge Placement                Patient chooses bed at: [facility] Patient to be transferred to facility by: Facility staff      Discharge Plan and Services Additional resources added to the After Visit Summary for   In-house Referral: Clinical Social Work   Post Acute Care Choice: Skilled Nursing Facility                               Social Determinants of Health (SDOH) Interventions SDOH Screenings   Food Insecurity: No Food Insecurity (08/05/2023)  Housing: Low Risk  (08/05/2023)  Transportation Needs: No Transportation Needs (08/05/2023)  Utilities: Not At Risk (08/05/2023)  Depression (PHQ2-9): Low Risk  (05/27/2023)  Financial Resource Strain: Low Risk  (07/04/2023)  Physical Activity: Unknown (07/04/2023)  Social Connections: Moderately Isolated (07/04/2023)  Stress: Stress Concern Present (07/04/2023)  Tobacco Use: Low Risk  (08/05/2023)     Readmission Risk Interventions    03/26/2021   12:33 PM 03/25/2021    2:59 PM  Readmission Risk Prevention Plan  Medication Screening  Complete  Transportation Screening  Complete  PCP or Specialist Appt within 5-7 Days Complete   Home Care Screening Complete   Medication Review (RN CM) Complete

## 2023-08-10 NOTE — Care Management Important Message (Signed)
Important Message  Patient Details  Name: Norma Barajas MRN: [id number] Date of Birth: [birth year]   Important Message Given:  Yes - Medicare IM (spoke with Ms. Iams by telephone to review letter, no additonal copy needed)     Corey Harold 08/10/2023, 3:30 PM

## 2023-08-10 NOTE — Plan of Care (Signed)
  Problem: Clinical Measurements: Goal: Diagnostic test results will improve Outcome: Progressing   

## 2023-08-10 NOTE — Discharge Summary (Signed)
[facility] Discharge Summary   Patient: Norma Barajas ZOX:[id number]  PCP: Sonny Masters, FNP  Date of admission: 08/05/2023   Date of discharge:  08/10/2023     Discharge Diagnoses:  Principal Problem:   Pelvic fracture (HCC) Active Problems:   Atrial fibrillation with RVR (HCC)   GAD (generalized anxiety disorder)   GERD   HTN (hypertension)   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)   Afib (HCC)   Vertigo   Admitted From: ALF Disposition:  SNF   Recommendations for Outpatient Follow-up:  Follow-up with PCP, patient should be seen by an MD in 1 to 2 days.  Monitor BP and titrate medications accordingly. Repeat BMP after 1 week Follow-up with cardiology as an outpatient for A-fib management Follow-up with orthopedic surgery as an outpatient for pelvic fracture. Follow up LABS/TEST:  BMP in 1 wk   Contact information for after-discharge care     Destination     [facility] Preferred SNF .   Service: Skilled Nursing Contact information: [address] [place] [address] [phone]                    Diet recommendation: Cardiac diet  Activity: The patient is advised to gradually reintroduce usual activities, as tolerated  Discharge Condition: stable  Code Status: DNR /DNI  History of present illness: As per the H and P dictated on admission Hospital Course:  Norma Barajas is a 82 y.o. female with PMH of diastolic CHF, A-fib not on AC due to high fall risk, severe TR CKD-3A, BPPV/vertigo, HTN, HLD and GERD brought to ED from [facility] due to dizziness and fall at ALF, and admitted for A-fib with RVR and pelvic fractures including left superior and inferior pubic rami fractures and possible left sacral fracture.  Pain improving. Therapy recommended SNF. Intermittent bouts of AFib with RVR delayed discharge.  Assessment and plan # Permanent atrial fibrillation with RVR:  Patient with known history of chronic A-fib.  Had DCCV in the past.  Per cardiology note in 7/24, he refused additional DCCV's.  Reportedly failed sotalol.  Not on AC due to high fall risk.  TTE in 6/24 with LVEF of 55% and RVSP of 30 mmHg, severe LAE and RAE and severe TR.  HR up to 130s while in ED but improved with IV Cardizem pushes and Toprol-XL.  It seems like she is on Toprol-XL 200 mg daily with as needed Cardizem.  Continues to have intermittent RVR. Continue metoprolol succinate, increase from 100mg  BID to 150mg  BID.  S/p IV metoprolol 2.5 mg every 4 hours as needed sustained HR > 120.  Resumed Cardizem on discharge.  Follow-up with cardiology as an outpatient # Acute hypoxic respiratory failure: Has been maintained on 2L O2, CXR suggests atelectasis.  Urged to mobilize more. D/w pt and RN to instruct and encourage use of incentive spirometry and wean to room air.  # Severe tricuspid valve regurgitation: Noted on echocardiogram on 04/2023.Outpatient follow-up with cardiology # Pelvic fracture due to fall at ALF: Patient with known history of vertigo/BPPV.  She was also in A-fib with RVR.  Reportedly hypotensive.  No report of head trauma or LOC.  Pelvic x-ray with left superior and inferior pubic rami fracture and possible sacral fracture.  No focal neurosymptoms on exam.  No nystagmus.  WBAT, pain control, nonoperative management per orthopedics, Dr. Dallas Schimke.  Continue Tylenol as needed and oxycodone as needed.  Continue laxative prevent constipation. PT and OT recommended SNF placement.  Continue fall precaution # Hypotension/essential hypertension: Hypotension resolved. Restarted torsemide # Stage IIIb CKD:  Baseline Cr ~0.7-0.8.  Renal function improved to baseline, would be consistent with stage IIIb. Continue monitoring as we restart diuretic # Chronic HFpEF: She reports some shortness of breath but appears euvolemic. -Chronic interstitial coarsening on CXR with mild R base opacity. More  consistent with atelectasis at this time. - BNP elevated (at 723 from a baseline in 500's), had JVD so restarted home torsemide. # Hypokalemia mild, potassium repleted. # Hyperlipidemia: Continue statin # Vitamin B12 level 310, goal >400, patient received vitamin B12 IM injection during hospital stay, started oral supplement. # GERD: Continue pantoprazole # Thrombocytopenia: Chronic, no intervention currently planned.  Platelet count 100k, stable Decreased oral intake, encourage oral intake, may start protein supplements. Body mass index is 24.07 kg/m.  Nutrition Interventions:  Pain control  - [place] Controlled Substance Reporting System database could not be reviewed, website was not working.   - 4 day supply was provided.  Prescription was printed as per SNF protocol - Patient was instructed, not to drive, operate heavy machinery, perform activities at heights, swimming or participation in water activities or provide baby sitting services while on Pain, Sleep and Anxiety Medications; until her outpatient Physician has advised to do so again.  - Also recommended to not to take more than prescribed Pain, Sleep and Anxiety Medications.  Patient was seen by physical therapy, who recommended Therapy, SNF placement, which was arranged. On the day of the discharge the patient's vitals were stable, and no other acute medical condition were reported by patient. the patient was felt safe to be discharge at [facility].  Consultants: None Procedures: None  Discharge Exam: General: Appear in no distress, no Rash; Oral Mucosa Clear, moist. Cardiovascular: Irregular rhythm, audible murmur, Respiratory: normal respiratory effort, Bilateral Air entry present and no Crackles, no wheezes Abdomen: Bowel Sound present, Soft and no tenderness, no hernia Extremities: no Pedal edema, no calf tenderness Neurology: alert and oriented to time, place, and person affect appropriate.  Filed Weights   08/08/23  0455 08/09/23 0436 08/10/23 0300  Weight: 50 kg 49.7 kg 48.7 kg   Vitals:   08/10/23 0500 08/10/23 0844  BP: 127/85 (!) 153/99  Pulse: 84 83  Resp:    Temp: 97.7 F (36.5 C) 98 F (36.7 C)  SpO2: 97% 98%    DISCHARGE MEDICATION: Allergies as of 08/10/2023       Reactions   Codeine Nausea And Vomiting   Doxycycline Other (See Comments)   Chest congestion   Triamterene-hctz Other (See Comments)   weakness   Atorvastatin Other (See Comments)   Myalgias   Crestor [rosuvastatin Calcium] Other (See Comments)   weakness   Morphine Nausea Only   Risedronate Sodium Other (See Comments)   ACTONEL - reflux        Medication List     STOP taking these medications    amLODipine 2.5 MG tablet Commonly known as: NORVASC       TAKE these medications    acetaminophen 500 MG tablet Commonly known as: TYLENOL Take 2 tablets (1,000 mg total) by mouth every 6 (six) hours as needed for mild pain.   Aspirin Low Dose 81 MG tablet Generic drug: aspirin EC TAKE 1 TABLET TWICE DAILY   bisacodyl 10 MG suppository Commonly known as: Dulcolax Place 1 suppository (10 mg total) rectally as needed for moderate constipation.  cholecalciferol 25 MCG (1000 UNIT) tablet Commonly known as: VITAMIN D3 Take 1,000 Units by mouth in the morning.   cyanocobalamin 1000 MCG tablet Commonly known as: VITAMIN B12 Take 1 tablet (1,000 mcg total) by mouth daily.   diltiazem 30 MG tablet Commonly known as: CARDIZEM Take 1 tablet (30 mg total) by mouth every 12 (twelve) hours as needed (For HR >>> 120 BPM).   meclizine 25 MG tablet Commonly known as: ANTIVERT TAKE 1 TABLET THREE TIMES DAILY AS NEEDED FOR DIZZINESS   metoprolol succinate 100 MG 24 hr tablet Commonly known as: Toprol XL Take 1.5 tablets (150 mg total) by mouth 2 (two) times daily. What changed:  medication strength how much to take when to take this   omeprazole 20 MG capsule Commonly known as: PRILOSEC TAKE 1 CAPSULE  EVERY DAY   oxyCODONE 5 MG immediate release tablet Commonly known as: Oxy IR/ROXICODONE Take 1 tablet (5 mg total) by mouth every 6 (six) hours as needed for severe pain or breakthrough pain.   polyethylene glycol 17 g packet Commonly known as: MIRALAX / GLYCOLAX Take 17 g by mouth 2 (two) times daily as needed for mild constipation.   potassium chloride 10 MEQ tablet Commonly known as: KLOR-CON Take 1 tablet (10 mEq total) by mouth daily. Take While taking Demadex/Torsemide   senna-docusate 8.6-50 MG tablet Commonly known as: Senokot-S Take 1 tablet by mouth 2 (two) times daily.   simvastatin 20 MG tablet Commonly known as: ZOCOR TAKE 1 TABLET EVERY DAY   torsemide 20 MG tablet Commonly known as: DEMADEX Take 1 tablet (20 mg total) by mouth 2 (two) times daily. Breakfast and lunch   traZODone 50 MG tablet Commonly known as: DESYREL Take 1 tablet (50 mg total) by mouth at bedtime.       Allergies  Allergen Reactions   Codeine Nausea And Vomiting   Doxycycline Other (See Comments)    Chest congestion   Triamterene-Hctz Other (See Comments)    weakness   Atorvastatin Other (See Comments)    Myalgias    Crestor [Rosuvastatin Calcium] Other (See Comments)    weakness   Morphine Nausea Only   Risedronate Sodium Other (See Comments)    ACTONEL - reflux   Discharge Instructions     Call MD for:  difficulty breathing, headache or visual disturbances   Complete by: As directed    Call MD for:  extreme fatigue   Complete by: As directed    Call MD for:  persistant dizziness or light-headedness   Complete by: As directed    Call MD for:  persistant nausea and vomiting   Complete by: As directed    Call MD for:  severe uncontrolled pain   Complete by: As directed    Call MD for:  temperature >100.4   Complete by: As directed    Diet - low sodium heart healthy   Complete by: As directed    Discharge instructions   Complete by: As directed    Follow-up with PCP,  patient should be seen by an MD in 1 to 2 days.  Monitor BP and titrate medications accordingly. Repeat BMP after 1 week Follow-up with cardiology as an outpatient for A-fib management Follow-up with orthopedic surgery as an outpatient for pelvic fracture.   Increase activity slowly   Complete by: As directed        The results of significant diagnostics from this hospitalization (including imaging, microbiology, ancillary and laboratory) are listed below for  reference.    Significant Diagnostic Studies: DG Chest Port 1 View  Result Date: 08/07/2023 CLINICAL DATA:  Shortness of breath. EXAM: PORTABLE CHEST 1 VIEW COMPARISON:  08/05/2023 FINDINGS: The cardio pericardial silhouette is enlarged. Interstitial markings are diffusely coarsened with chronic features. Subtle atelectasis or infiltrate at the right base is minimally progressive. Possible tiny left pleural effusion. Bones are diffusely demineralized. Telemetry leads overlie the chest. IMPRESSION: Chronic interstitial coarsening with minimally progressive atelectasis or infiltrate at the right base. Electronically Signed   By: Kennith Center M.D.   On: 08/07/2023 17:20   DG Pelvis 1-2 Views  Result Date: 08/05/2023 CLINICAL DATA:  Fall.  Bilateral hip pain. EXAM: PELVIS - 1-2 VIEW COMPARISON:  06/23/2021 FINDINGS: Right proximal femoral nail system noted. Likely chronic deformity of the right inferior pubic ramus. Suspected acute fractures the left superior and inferior pubic rami. Such fractures would typically also include a sacral fracture, the sacrum is difficult to characterize due to bony demineralization. Atherosclerosis is present, including aortoiliac atherosclerotic disease. IMPRESSION: 1. Suspected acute fractures of the left superior and inferior pubic rami. Possible fracture of the left sacrum. 2. Chronic deformity of the right inferior pubic ramus. 3. Atherosclerosis. Electronically Signed   By: Gaylyn Rong M.D.   On:  08/05/2023 14:41   DG Chest Portable 1 View  Result Date: 08/05/2023 CLINICAL DATA:  Hypoxia, fall EXAM: PORTABLE CHEST 1 VIEW COMPARISON:  05/19/2023 FINDINGS: Atherosclerotic calcification of the aortic arch. Mild cardiomegaly. The lungs appear clear. Substantial thoracic kyphosis and spondylosis known compression fractures in the thoracic and lumbar spine better shown on prior imaging. The patient is rotated to the right on today's radiograph, reducing diagnostic sensitivity and specificity. IMPRESSION: 1. Mild cardiomegaly. 2. Substantial thoracic kyphosis and spondylosis. Known compression fractures in the thoracic and lumbar spine better shown on prior imaging. Electronically Signed   By: Gaylyn Rong M.D.   On: 08/05/2023 14:39    Microbiology: Recent Results (from the past 240 hour(s))  MRSA Next Gen by PCR, Nasal     Status: None   Collection Time: 08/05/23  4:00 PM   Specimen: Nasal Mucosa; Nasal Swab  Result Value Ref Range Status   MRSA by PCR Next Gen NOT DETECTED NOT DETECTED Final    Comment: (NOTE) The GeneXpert MRSA Assay (FDA approved for NASAL specimens only), is one component of a comprehensive MRSA colonization surveillance program. It is not intended to diagnose MRSA infection nor to guide or monitor treatment for MRSA infections. Test performance is not FDA approved in patients less than 59 years old. Performed at [facility], [address] [place], [place] [address]      Labs: CBC: Recent Labs  Lab 08/05/23 1233 08/06/23 0358 08/07/23 0421 08/08/23 0447  WBC 9.4 11.0* 8.8 7.2  HGB 16.0* 14.7 13.0 13.5  HCT 48.4* 44.2 40.4 41.1  MCV 97.0 96.3 97.3 96.3  PLT 130* 130* 99* 100*   Basic Metabolic Panel: Recent Labs  Lab 08/05/23 1233 08/05/23 1247 08/06/23 0358 08/06/23 0806 08/07/23 0421 08/08/23 0447 08/10/23 0445  NA 135  --  138  --  133* 134* 134*  K 3.8  --  3.5  --  4.2 3.9 3.3*  CL 94*  --  95*  --  95* 95* 91*  CO2 27  --  30  --   28 27 33*  GLUCOSE 159*  --  98  --  90 104* 101*  BUN 27*  --  22  --  23 22 23   CREATININE 1.16*  --  0.85  --  0.90 0.86 0.86  CALCIUM 8.8*  --  8.7*  --  8.5* 8.6* 8.5*  MG  --  2.2  --  2.2 2.1 2.0  --   PHOS  --   --   --   --  2.9 3.2  --    Liver Function Tests: Recent Labs  Lab 08/07/23 0421 08/08/23 0447  ALBUMIN 3.1* 3.1*   No results for input(s): "LIPASE", "AMYLASE" in the last 168 hours. No results for input(s): "AMMONIA" in the last 168 hours. Cardiac Enzymes: No results for input(s): "CKTOTAL", "CKMB", "CKMBINDEX", "TROPONINI" in the last 168 hours. BNP (last 3 results) Recent Labs    04/20/23 0950 08/07/23 1330  BNP 275.8* 723.0*   CBG: No results for input(s): "GLUCAP" in the last 168 hours.  Time spent: 35 minutes  Signed:  Gillis Santa  [facility] 08/10/2023 12:19 PM

## 2023-08-10 NOTE — Progress Notes (Signed)
C/O pain to left leg and had prn oxycodone along with scheduled tylenol. Initially refused lovenox but later agreed to have it when given choice of that or SCD's.  Slept some throughout night

## 2023-08-10 NOTE — Progress Notes (Addendum)
Patient discharged to [facility],report called and given to [facility] LPN. IV discontinued,catheter intact.Staff to accompanied patient to awaiting facility.

## 2023-08-11 ENCOUNTER — Encounter: Payer: Self-pay | Admitting: Adult Health

## 2023-08-11 ENCOUNTER — Non-Acute Institutional Stay (SKILLED_NURSING_FACILITY): Payer: Self-pay | Admitting: Adult Health

## 2023-08-11 DIAGNOSIS — K219 Gastro-esophageal reflux disease without esophagitis: Secondary | ICD-10-CM

## 2023-08-11 DIAGNOSIS — I110 Hypertensive heart disease with heart failure: Secondary | ICD-10-CM | POA: Diagnosis not present

## 2023-08-11 DIAGNOSIS — N1831 Chronic kidney disease, stage 3a: Secondary | ICD-10-CM | POA: Diagnosis not present

## 2023-08-11 DIAGNOSIS — E782 Mixed hyperlipidemia: Secondary | ICD-10-CM

## 2023-08-11 DIAGNOSIS — S3282XG Multiple fractures of pelvis without disruption of pelvic ring, subsequent encounter for fracture with delayed healing: Secondary | ICD-10-CM | POA: Diagnosis not present

## 2023-08-11 DIAGNOSIS — I700 Atherosclerosis of aorta: Secondary | ICD-10-CM

## 2023-08-11 DIAGNOSIS — I5032 Chronic diastolic (congestive) heart failure: Secondary | ICD-10-CM

## 2023-08-11 DIAGNOSIS — I5082 Biventricular heart failure: Secondary | ICD-10-CM

## 2023-08-11 DIAGNOSIS — I4891 Unspecified atrial fibrillation: Secondary | ICD-10-CM

## 2023-08-11 NOTE — Progress Notes (Signed)
Location:  [facility] Room Number: N124P Place of Service:  SNF (31)   CODE STATUS: DNR  Allergies  Allergen Reactions   Codeine Nausea And Vomiting   Doxycycline Other (See Comments)    Chest congestion   Triamterene-Hctz Other (See Comments)    weakness   Atorvastatin Other (See Comments)    Myalgias    Crestor [Rosuvastatin Calcium] Other (See Comments)    weakness   Morphine Nausea Only   Risedronate Sodium Other (See Comments)    ACTONEL - reflux    Chief Complaint  Patient presents with   Hospitalization Follow-up    Hospital Follow up    HPI:  She is a 82 year old woman who has been hospitalized from 08-05-23 through 08-10-23. Her medical history includes: diastolic CHF: atrial fibrillation (not on anti-coagulation due to fall risk); GERD; hyper tension; severe TR: vertigo. She presented to the ED from an ALF due to dizziness and fall. She was admitted pelvic fracture left superior and inferior pubic rami fractures; left sacral fracture. Atrial fibrillation with rvr. She has had a cardioversion in the past. She failed satolol therapy.  Pelvic fracture due to fall at ALF no surgical intervention per Dr. Dallas Schimke. Will be WBAT.  Permanent atrial fibrillation with RVR; is chronic in nature 06/24: EF 55% and RVSP of 30 mmHg, severe LAE and RAE and severe TR. She is here with her goal to return back to assisted living. 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 Nonobstructive minimal CAD 10/2005.   Depression     Diastolic dysfunction    Grade 1. Ejection fraction 60-65%.   Dysrhythmia    a fib   Erosive esophagitis    Essential hypertension    Fracture of ramus of right pubis with routine healing 12/09/2020   GERD (gastroesophageal reflux disease)    Grade III hemorrhoids    History of hiatal hernia    History of prediabetes    Hypercholesterolemia    Internal hemorrhoids with complication 07/29/2008   OCT 2015 FLEX SIG/IH BANDING     Intertrochanteric fracture of femur (HCC)    5/16 - 03/26/2021 fracture sustained in a mechanical fall.  IM nailing performed 5/17 by Dr Thane Edu Warfarin held because of posttraumatic pelvic hematoma.  Postop DVT with 81 mg aspirin twice daily x28 days.   Major depressive disorder, recurrent, moderate (HCC) 12/09/2020   Migraine    "used to have them right bad; I don't now" (04/11/2015)   MITRAL REGURGITATION 04/27/2010   Qualifier: Diagnosis of  By: Eden Emms, MD, Harrington Challenger    Osteoporosis    Paroxysmal atrial fibrillation [facility])    Pelvic hematoma, female 03/27/2021   Sustained in mechanical fall.  Warfarin held.  IM nailing performed 5/17 with postop DVT prophylaxis 81 mg twice daily x28 days.   PONV (postoperative nausea and vomiting)    Vertigo     Past Surgical History:  Procedure Laterality Date   BRAVO PH STUDY  11/15/2012   Procedure: BRAVO PH STUDY;  Surgeon: West Bali, MD;  Location: [facility];  Service: Endoscopy;;   CARDIOVERSION N/A 05/16/2020   Procedure: CARDIOVERSION;  Surgeon: Little Ishikawa, MD;  Location: [facility] ENDOSCOPY;  Service: Cardiovascular;  Laterality: N/A;   CATARACT EXTRACTION W/ INTRAOCULAR LENS  IMPLANT, BILATERAL Bilateral    COLONOSCOPY  2008   Dr. Darrick Penna: internal hemorrhoids    DILATION AND CURETTAGE OF UTERUS     ESOPHAGEAL DILATION N/A 06/16/2021   Procedure: ESOPHAGEAL DILATION;  Surgeon: Dolores Frame, MD;  Location: [facility];  Service: Gastroenterology;  Laterality: N/A;    ESOPHAGOGASTRODUODENOSCOPY (EGD) WITH ESOPHAGEAL DILATION  2001   Dr. Arlyce Dice: erosive esophagitis, esophageal stricture, duodenitis, s/p Savary dilation   ESOPHAGOGASTRODUODENOSCOPY (EGD) WITH ESOPHAGEAL DILATION  11/15/2012   [id number] web was found & MOST LIKELY CAUSE FOR DYAPHAGIA/Polyp was found in the gastric body and gastric fundus/ gastritis on bx   ESOPHAGOGASTRODUODENOSCOPY (EGD) WITH PROPOFOL N/A 06/16/2021   Procedure: ESOPHAGOGASTRODUODENOSCOPY (EGD) WITH PROPOFOL;  Surgeon: Dolores Frame, MD;  Location: [facility];  Service: Gastroenterology;  Laterality: N/A;  8:15   EYE SURGERY Bilateral    "laser OR after cataract OR; cause I couldn't see"   FLEXIBLE SIGMOIDOSCOPY N/A 08/22/2014   mild diverticulosis in sigmoid, moderate sized Grade 3 hemorrhoids s/p banding X 3.    FRACTURE SURGERY Right    below the knee - 2 bones broke has plates   HEMORRHOID BANDING N/A 08/22/2014   Procedure: HEMORRHOID BANDING;  Surgeon: West Bali, MD;  Location: [facility];  Service: Endoscopy;  Laterality: N/A;   HEMORRHOID SURGERY N/A 03/30/2018   Procedure: EXTENSIVE HEMORRHOIDECTOMY;  Surgeon: Lucretia Roers, MD;  Location: [facility] ORS;  Service: General;  Laterality: N/A;   INTRAMEDULLARY (IM) NAIL INTERTROCHANTERIC Right 03/24/2021   Procedure: INTRAMEDULLARY (IM) NAIL INTERTROCHANTRIC;  Surgeon: Oliver Barre, MD;  Location: [facility] ORS;  Service: Orthopedics;  Laterality: Right;   OPEN REDUCTION INTERNAL FIXATION (ORIF) TIBIA/FIBULA FRACTURE Right 2013   broke tibia and fibula after falling down stairs   PROLAPSED UTERINE FIBROID LIGATION  2015   TOTAL ABDOMINAL HYSTERECTOMY      Social History   Socioeconomic History   Marital status: Widowed    Spouse name: george    Number of children: 1   Years of education: Not on file   Highest education level: Not on file  Occupational History   Occupation: Retired    Comment: Textile  Tobacco Use   Smoking status: Never    Smokeless tobacco: Never  Vaping Use   Vaping status: Never Used  Substance and Sexual Activity   Alcohol use: No    Alcohol/week: 0.0 standard drinks of alcohol   Drug use: No   Sexual activity: Not Currently    Birth control/protection: Surgical, Post-menopausal    Comment: hyst  Other Topics Concern   Not on file  Social History Narrative   Married   No regular exercise   Social Determinants of Health   Financial Resource Strain: Low Risk  (07/04/2023)   Overall Financial Resource Strain (CARDIA)    Difficulty of Paying Living Expenses: Not hard at all  Food Insecurity: No Food Insecurity (08/05/2023)   Hunger Vital Sign    Worried About Running Out of Food in the Last Year: Never true    Ran Out of Food in the Last Year: Never true  Transportation Needs: No Transportation Needs (08/05/2023)   PRAPARE -  Administrator, Civil Service (Medical): No    Lack of Transportation (Non-Medical): No  Physical Activity: Unknown (07/04/2023)   Exercise Vital Sign    Days of Exercise per Week: 0 days    Minutes of Exercise per Session: Not on file  Stress: Stress Concern Present (07/04/2023)   [employer] of Occupational Health - Occupational Stress Questionnaire    Feeling of Stress : To some extent  Social Connections: Moderately Isolated (07/04/2023)   Social Connection and Isolation Panel [NHANES]    Frequency of Communication with Friends and Family: More than three times a week    Frequency of Social Gatherings with Friends and Family: More than three times a week    Attends Religious Services: More than 4 times per year    Active Member of [employer] or Organizations: No    Attends Banker Meetings: Not on file    Marital Status: Widowed  Intimate Partner Violence: Not At Risk (08/05/2023)   Humiliation, Afraid, Rape, and Kick questionnaire    Fear of Current or Ex-Partner: No    Emotionally Abused: No    Physically Abused: No    Sexually Abused: No    Family History  Problem Relation Age of Onset   Colon cancer Mother 68   Heart disease Mother    Osteoporosis Mother    Hip fracture Mother    Stroke Sister    Diabetes Sister    Osteoporosis Sister    Arthritis Sister    Uterine cancer Sister    Stroke Sister    Heart disease Father    Hyperlipidemia Brother    Hypertension Brother    Heart disease Brother    Stroke Brother    Heart disease Sister    Dementia Sister    Diabetes Son    Stroke Son    Heart attack Neg Hx       VITAL SIGNS BP 128/72   Pulse 70   Temp 97.8 F (36.6 C)   Resp 20   Ht 4\' 8"  (1.422 m)   Wt 108 lb 9.6 oz (49.3 kg)   SpO2 96%   BMI 24.35 kg/m   Outpatient Encounter Medications as of 08/11/2023  Medication Sig   acetaminophen (TYLENOL) 325 MG tablet Take 650 mg by mouth every 6 (six) hours as needed.   ASPIRIN LOW DOSE 81 MG tablet TAKE 1 TABLET TWICE DAILY   bisacodyl (DULCOLAX) 10 MG suppository Place 1 suppository (10 mg total) rectally as needed for moderate constipation.   cholecalciferol (VITAMIN D3) 25 MCG (1000 UNIT) tablet Take 1,000 Units by mouth in the morning.   cyanocobalamin (VITAMIN B12) 1000 MCG tablet Take 1 tablet (1,000 mcg total) by mouth daily.   metoprolol succinate (TOPROL XL) 100 MG 24 hr tablet Take 1.5 tablets (150 mg total) by mouth 2 (two) times daily.   omeprazole (PRILOSEC) 20 MG capsule TAKE 1 CAPSULE EVERY DAY   oxyCODONE (OXY IR/ROXICODONE) 5 MG immediate release tablet Take 1 tablet (5 mg total) by mouth every 6 (six) hours as needed for severe pain or breakthrough pain.   polyethylene glycol (MIRALAX / GLYCOLAX) 17 g packet Take 17 g by mouth 2 (two) times daily as needed for mild constipation.   potassium chloride (KLOR-CON) 10 MEQ tablet Take 1 tablet (10 mEq total) by mouth daily. Take While taking Demadex/Torsemide   senna-docusate (SENOKOT-S) 8.6-50 MG tablet Take 1 tablet by mouth 2 (two) times daily.   simvastatin (ZOCOR) 20  MG tablet TAKE 1  TABLET EVERY DAY   torsemide (DEMADEX) 20 MG tablet Take 1 tablet (20 mg total) by mouth 2 (two) times daily. Breakfast and lunch   traZODone (DESYREL) 50 MG tablet Take 1 tablet (50 mg total) by mouth at bedtime.   No facility-administered encounter medications on file as of 08/11/2023.     SIGNIFICANT DIAGNOSTIC EXAMS  TODAY  08-04-33: wbc 9.4; hgb 16.0; hct 48.4; mcv 97.0 plt 130; glucose 159; bun 27; creat 1.16; k+ 3.8; na++ 135; ca 8.8; gfr 46; mag 2.2 08-08-23: wbc 7.2; hgb 13.5; hct 41.1; mcv 96.3 plt 100; glucose 104; bun 22; creat 0.86; k+ 3.9; na++ 134; ca 8.6; gfr >60; phos 3.2 albumin 3.1    Review of Systems  Constitutional:  Negative for malaise/fatigue.  Respiratory:  Negative for cough and shortness of breath.   Cardiovascular:  Negative for chest pain, palpitations and leg swelling.  Gastrointestinal:  Negative for abdominal pain, constipation and heartburn.  Musculoskeletal:  Negative for back pain, joint pain and myalgias.  Skin: Negative.   Neurological:  Negative for dizziness.  Psychiatric/Behavioral:  The patient is not nervous/anxious.    Physical Exam Constitutional:      General: She is not in acute distress.    Appearance: She is well-developed. She is not diaphoretic.  Neck:     Thyroid: No thyromegaly.  Cardiovascular:     Rate and Rhythm: Normal rate and regular rhythm.     Pulses: Normal pulses.     Heart sounds: Normal heart sounds.  Pulmonary:     Effort: Pulmonary effort is normal. No respiratory distress.     Breath sounds: Normal breath sounds.  Abdominal:     General: Bowel sounds are normal. There is no distension.     Palpations: Abdomen is soft.     Tenderness: There is no abdominal tenderness.  Musculoskeletal:        General: Normal range of motion.     Cervical back: Neck supple.     Right lower leg: No edema.     Left lower leg: No edema.  Lymphadenopathy:     Cervical: No cervical adenopathy.  Skin:    General: Skin is warm  and dry.  Neurological:     Mental Status: She is alert. Mental status is at baseline.  Psychiatric:        Mood and Affect: Mood normal.      ASSESSMENT/ PLAN:  TODAY  Multiple closed fracture of pelvis without disruption of pelvic ring with delayed healing subsequent encounter: will continue therapy as directed; will continue asa 81 mg twice daily ; has oxycodone 5 mg every 6 hours as needed  2. Aortic atherosclerosis (ct 05-19-23) is on statin  3. Atrial fibrillation with RVR: heart rate is stable; will continue asa 81 mg twice daily; will continue toprol xl 150 mg daily for rate control  4. 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 Hypertensive heart disease with chronic diastolic congestive heart failure: b/p 128/72: will continue toprol xl 150 mg daily   6. Gastroesophageal reflux disease without esophagitis: will continue prilosec 20 mg daily   7. Acute respiratory failure with hypoxia: is on 02 at this time; this is new to her will need to attempt wean  8. Stage 3a chronic kidney disease: bun 22 creat 0.86 gfr >60  9. Mixed hyperlipidemia: will continue zocor 20 mg daily   10. Chronic  insomnia: will continue trazodone 50 mg nightly   11. Chronic constipation: will continue senna twice daily and has miralax daily as needed     Synthia Innocent NP [facility]   call [phone]

## 2023-08-12 ENCOUNTER — Non-Acute Institutional Stay (SKILLED_NURSING_FACILITY): Payer: Medicare HMO | Admitting: Internal Medicine

## 2023-08-12 ENCOUNTER — Encounter: Payer: Self-pay | Admitting: Internal Medicine

## 2023-08-12 DIAGNOSIS — I5032 Chronic diastolic (congestive) heart failure: Secondary | ICD-10-CM

## 2023-08-12 DIAGNOSIS — S3282XG Multiple fractures of pelvis without disruption of pelvic ring, subsequent encounter for fracture with delayed healing: Secondary | ICD-10-CM | POA: Diagnosis not present

## 2023-08-12 DIAGNOSIS — N1831 Chronic kidney disease, stage 3a: Secondary | ICD-10-CM | POA: Diagnosis not present

## 2023-08-12 DIAGNOSIS — I5082 Biventricular heart failure: Secondary | ICD-10-CM | POA: Diagnosis not present

## 2023-08-12 DIAGNOSIS — I4891 Unspecified atrial fibrillation: Secondary | ICD-10-CM | POA: Diagnosis not present

## 2023-08-12 NOTE — Assessment & Plan Note (Signed)
Delayed healing expected due to osteoporosis and hypocalcemia. PT/OT at SNF as tolerated.

## 2023-08-12 NOTE — Progress Notes (Unsigned)
NURSING HOME LOCATION:  [facility] ROOM NUMBER:  124 P  CODE STATUS:  DNR  PCP:  Gilford Silvius FNP  This is a comprehensive admission note to this SNFperformed on this date less than 30 days from date of admission. Included are preadmission medical/surgical history; reconciled medication list; family history; social history and comprehensive review of systems.  Corrections and additions to the records were documented. Comprehensive physical exam was also performed. Additionally a clinical summary was entered for each active diagnosis pertinent to this admission in the Problem List to enhance continuity of care.  HPI:She was hospitalized 9/27 - 08/10/2023 with pelvic fracture.  Course was complicated by atrial fibrillation with rapid ventricular response and hypotension.  He was brought to ED from [facility] due to dizziness and a fall at that facility.  Imaging revealed pelvic fractures including the left superior and inferior pubic rami and possible left sacral fracture. The patient has permanent atrial fibrillation but is not on anticoagulation because of high risk of falls. Initial creatinine was 1.16 with a GFR of 46 but this improved at the time of discharge to a creatinine of 0.86 with a GFR greater than 60.  There was mild hyponatremia with a final value of 133.  At discharge potassium was slightly reduced at 3.3.  She has a history of diastolic dysfunction and BNP was 723.  Chest film revealed cardiomegaly and interstitial changes without frank congestive heart failure.  Thrombocytopenia was present with platelet counts ranging from a low of 99,000 up to high 130,000.  No bleeding dyscrasias were described and CBC was normal. TSH had been  therapeutic at 1.034 on 07/01/2023. WBAT was recommended with SNF placement for PT/OT because of the history of recurrent falls.  Past medical and surgical history: Includes history of acoustic neuroma, benign paroxysmal positional  vertigo, coronary atherosclerosis, diastolic dysfunction,  essential hypertension, dyslipidemia, history of major depression disorder, history of migraines, PAF, and GERD with history of erosive esophagitis Surgery and procedures include cardioversion, colonoscopy, esophageal dilation, intertrochanteric intramedullary nailing.  Right hip and open reduction internal fixation of tibia/fibular fracture, and TAH.  Family history: Extensive history reviewed, non contributory due to advanced age.  Social history: Non-smoker, nondrinker.   Review of systems: She states that she was using her walker when she became dizzy and fell.  She had no other neurologic or cardiac prodrome.  She does state that she "stays in A-fib."  She had an increase in her pain this morning and staff reports that she did not eat.  She had constipation for 3 days.  She describes some dysuria.  Paroxysmal nocturnal dyspnea is intermittent.  She describes nonproductive cough occasionally.  The only bleeding dyscrasias she has is occasional epistaxis.  Constitutional: No fever, significant weight change, fatigue  Eyes: No redness, discharge, pain, vision change ENT/mouth: No nasal congestion, purulent discharge, earache, change in hearing, sore throat  Cardiovascular: No chest pain, palpitations, paroxysmal nocturnal dyspnea, claudication, edema  Respiratory: No cough, sputum production, hemoptysis, DOE, significant snoring, apnea Gastrointestinal: No heartburn, dysphagia, abdominal pain, nausea /vomiting, rectal bleeding, melena, change in bowels Genitourinary: No dysuria, hematuria, pyuria, incontinence, nocturia Musculoskeletal: No joint stiffness, joint swelling, weakness, pain Dermatologic: No rash, pruritus, change in appearance of skin Neurologic: No dizziness, headache, syncope, seizures, numbness, tingling Psychiatric: No significant anxiety, depression, insomnia, anorexia Endocrine: No change in hair/skin/nails, excessive  thirst, excessive hunger, excessive urination  Hematologic/lymphatic: No significant bruising, lymphadenopathy, abnormal bleeding Allergy/immunology: No itchy/watery eyes, significant sneezing,  urticaria, angioedema  Physical exam:  Pertinent or positive findings: She appears her age and chronically ill.  She was initially asleep and exhibited cyanosis around her mouth.  Upon awakening she realized that she had taken off her oxygen.  She is hard of hearing.  Eyebrows are decreased laterally.  Initially the right eye was almost closed but subsequently she was able to open it fully.  The eyes are sunken and the lower lids are puffy.  She has an upper plate in the lower partial.  A-fib is present with a normal rate.  Pedal pulses are decreased but palpable.  She has trace edema at the sock line.  There is marked interosseous wasting of the hands. General appearance: Adequately nourished; no acute distress, increased work of breathing is present.   Lymphatic: No lymphadenopathy about the head, neck, axilla. Eyes: No conjunctival inflammation or lid edema is present. There is no scleral icterus. Ears:  External ear exam shows no significant lesions or deformities.   Nose:  External nasal examination shows no deformity or inflammation. Nasal mucosa are pink and moist without lesions, exudates Oral exam: Lips and gums are healthy appearing.There is no oropharyngeal erythema or exudate. Neck:  No thyromegaly, masses, tenderness noted.    Heart:  Normal rate and regular rhythm. S1 and S2 normal without gallop, murmur, click, rub.  Lungs: Chest clear to auscultation without wheezes, rhonchi, rales, rubs. Abdomen: Bowel sounds are normal.  Abdomen is soft and nontender with no organomegaly, hernias, masses. GU: Deferred  Extremities:  No cyanosis, clubbing, edema. Neurologic exam:  Strength equal  in upper & lower extremities. Balance, Rhomberg, finger to nose testing could not be completed due to clinical  state Deep tendon reflexes are equal Skin: Warm & dry w/o tenting. No significant lesions or rash.  See clinical summary under each active problem in the Problem List with associated updated therapeutic plan

## 2023-08-12 NOTE — Assessment & Plan Note (Addendum)
BNP was 723 while hospitalized were for pelvic fractures.  Chest x-ray revealed cardiomegaly but no frank congestive heart failure.  Also exhibited interstitial changes. There is no significant neck vein distention and only trace edema at the sock line.  Initially she was asleep exhibiting hypopnea and was not wearing O2.  She exhibited periorbital cyanosis.   It was reinforced that wearing the oxygen is critical to prevent stress on her heart.

## 2023-08-12 NOTE — Assessment & Plan Note (Signed)
Rate at this time is adequately controlled but she remains in A-fib.

## 2023-08-12 NOTE — Consult Note (Signed)
[facility] Customer service manager [facility]) Accountable Care Organization (ACO) [facility] Liaison Note  08/12/2023  BETHANNY TOELLE 1941/07/30 [id number]  Location: [facility] Liaison screened the patient remotely at [facility].  Insurance: [employer] HMO   Allyce LORETTE PETERKIN is a 82 y.o. female who is a Primary Care Patient of Rakes, Doralee Albino, FNP ([facility]). The patient was screened for  readmission hospitalization with noted high risk score for unplanned readmission risk with 4 IP in 6 months.  The patient was assessed for potential [facility]) Care Management service needs for post hospital transition for care coordination. Review of patient's electronic medical record reveals patient was admitted for Atrial Fibrillation. Pt was discharged to SNF level of care. Facility will continue to address all pt's needs at the facility.  [facility] Care Management/Population Health does not replace or interfere with any arrangements made by the Inpatient Transition of Care team.   For questions contact:   Elliot Cousin, RN, [facility] Liaison [place]   Population Health Office Hours MTWF  8:00 am-6:00 pm [phone] mobile [phone] [Office toll free line] Office Hours are M-F 8:30 - 5 pm [email or web address]

## 2023-08-12 NOTE — Patient Instructions (Signed)
See assessment and plan under each diagnosis in the problem list and acutely for this visit 

## 2023-08-12 NOTE — Assessment & Plan Note (Signed)
Initial creatinine was 1.16 with a GFR 46 indicating CKD low stage III a;but at discharge creatinine was 0.86, GFR greater than 60 indicating CKD stage II. Med list reviewed; no indication for change in medications or dosage.

## 2023-08-16 ENCOUNTER — Ambulatory Visit: Payer: Medicare HMO | Admitting: Family Medicine

## 2023-08-24 ENCOUNTER — Other Ambulatory Visit: Payer: Self-pay | Admitting: Adult Health

## 2023-08-24 ENCOUNTER — Encounter: Payer: Self-pay | Admitting: Adult Health

## 2023-08-24 ENCOUNTER — Non-Acute Institutional Stay (SKILLED_NURSING_FACILITY): Payer: Medicare HMO | Admitting: Adult Health

## 2023-08-24 DIAGNOSIS — I700 Atherosclerosis of aorta: Secondary | ICD-10-CM

## 2023-08-24 DIAGNOSIS — I4821 Permanent atrial fibrillation: Secondary | ICD-10-CM

## 2023-08-24 DIAGNOSIS — N1831 Chronic kidney disease, stage 3a: Secondary | ICD-10-CM

## 2023-08-24 DIAGNOSIS — S3282XG Multiple fractures of pelvis without disruption of pelvic ring, subsequent encounter for fracture with delayed healing: Secondary | ICD-10-CM

## 2023-08-24 DIAGNOSIS — I5032 Chronic diastolic (congestive) heart failure: Secondary | ICD-10-CM

## 2023-08-24 DIAGNOSIS — F5101 Primary insomnia: Secondary | ICD-10-CM

## 2023-08-24 MED ORDER — METOPROLOL SUCCINATE ER 100 MG PO TB24: 150.0000 mg | ORAL_TABLET | Freq: Two times a day (BID) | ORAL | 0 refills | Status: DC

## 2023-08-24 MED ORDER — TRAZODONE HCL 50 MG PO TABS
50.0000 mg | ORAL_TABLET | Freq: Every day | ORAL | 0 refills | Status: DC
Start: 2023-08-24 — End: 2024-03-01

## 2023-08-24 MED ORDER — SIMVASTATIN 20 MG PO TABS: 20.0000 mg | ORAL_TABLET | Freq: Every day | ORAL | 0 refills | Status: DC

## 2023-08-24 MED ORDER — TORSEMIDE 20 MG PO TABS: 20.0000 mg | ORAL_TABLET | Freq: Two times a day (BID) | ORAL | 0 refills | Status: DC

## 2023-08-24 NOTE — Progress Notes (Addendum)
Location:  [facility] Room Number: 124 Place of Service:  SNF (31)   CODE STATUS: dnr   Allergies  Allergen Reactions   Codeine Nausea And Vomiting   Doxycycline Other (See Comments)    Chest congestion   Triamterene-Hctz Other (See Comments)    weakness   Atorvastatin Other (See Comments)    Myalgias    Crestor [Rosuvastatin Calcium] Other (See Comments)    weakness   Morphine Nausea Only   Risedronate Sodium Other (See Comments)    ACTONEL - reflux    Chief Complaint  Patient presents with   Discharge Note    HPI:  She is being discharged back to assisted living with home health for pt/ot. She will need her prescriptions written; a wheelchair; and to follow up with her medical provider. T. She had been hospitalized for pelvic fractures. She was admitted to this facility for short term rehab. Therapy: ambulate 85 feet with rollator and supervision; upper body: stand by assist lower body min assist; transfers supervision; BRP contact guard. BCAT 26/50.   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 Nonobstructive minimal CAD 10/2005.   Depression    Diastolic dysfunction    Grade 1. Ejection fraction 60-65%.   Dysrhythmia    a fib   Erosive esophagitis    Essential hypertension    Fracture of ramus of right pubis with routine healing 12/09/2020   GERD (gastroesophageal reflux disease)    Grade III hemorrhoids    History of hiatal hernia    History of prediabetes    Hypercholesterolemia    Internal hemorrhoids with complication 07/29/2008   OCT 2015 FLEX SIG/IH BANDING     Intertrochanteric fracture of femur (HCC)    5/16 - 03/26/2021 fracture sustained in a mechanical fall.  IM nailing performed 5/17 by Dr Thane Edu  Warfarin held because of posttraumatic pelvic hematoma.  Postop DVT with 81 mg aspirin twice daily x28 days.   Major depressive disorder, recurrent, moderate (HCC) 12/09/2020   Migraine    "used to have them right bad; I don't now" (04/11/2015)   MITRAL REGURGITATION 04/27/2010   Qualifier: Diagnosis of  By: Eden Emms, MD, Harrington Challenger    Osteoporosis    Paroxysmal atrial fibrillation [facility])    Pelvic hematoma, female 03/27/2021   Sustained in mechanical fall.  Warfarin held.  IM nailing performed 5/17 with postop DVT prophylaxis 81 mg twice daily x28 days.   PONV (postoperative nausea and vomiting)    Vertigo     Past Surgical History:  Procedure Laterality Date   BRAVO [facility] STUDY  11/15/2012   Procedure: BRAVO PH STUDY;  Surgeon: West Bali, MD;  Location: [facility]O [facility];  Service: Endoscopy;;   CARDIOVERSION N/A 05/16/2020   Procedure: CARDIOVERSION;  Surgeon: Little Ishikawa, MD;  Location: [facility] ENDOSCOPY;  Service: Cardiovascular;  Laterality: N/A;   CATARACT EXTRACTION W/ INTRAOCULAR LENS  IMPLANT, BILATERAL Bilateral    COLONOSCOPY  2008   Dr. Darrick Penna: internal hemorrhoids    DILATION AND CURETTAGE OF UTERUS     ESOPHAGEAL DILATION N/A 06/16/2021   Procedure: ESOPHAGEAL DILATION;  Surgeon: Dolores Frame, MD;  Location: [facility] ENDO SUITE;  Service: Gastroenterology;  Laterality: N/A;   ESOPHAGOGASTRODUODENOSCOPY (EGD) WITH ESOPHAGEAL DILATION  2001   Dr. Arlyce Dice: erosive esophagitis, esophageal stricture, duodenitis, s/p Savary dilation   ESOPHAGOGASTRODUODENOSCOPY (EGD) WITH ESOPHAGEAL DILATION  11/15/2012   [id number] web was found & MOST LIKELY CAUSE FOR DYAPHAGIA/Polyp was found in the gastric body and gastric fundus/ gastritis on bx   ESOPHAGOGASTRODUODENOSCOPY (EGD) WITH PROPOFOL N/A 06/16/2021   Procedure: ESOPHAGOGASTRODUODENOSCOPY (EGD) WITH PROPOFOL;  Surgeon: Dolores Frame, MD;  [facility]: [facility];  Service: Gastroenterology;  Laterality: N/A;   8:15   EYE SURGERY Bilateral    "laser OR after cataract OR; cause I couldn't see"   FLEXIBLE SIGMOIDOSCOPY N/A 08/22/2014   mild diverticulosis in sigmoid, moderate sized Grade 3 hemorrhoids s/p banding X 3.    FRACTURE SURGERY Right    below the knee - 2 bones broke has plates   HEMORRHOID BANDING N/A 08/22/2014   Procedure: HEMORRHOID BANDING;  Surgeon: West Bali, MD;  Location: [facility];  Service: Endoscopy;  Laterality: N/A;   HEMORRHOID SURGERY N/A 03/30/2018   Procedure: EXTENSIVE HEMORRHOIDECTOMY;  Surgeon: Lucretia Roers, MD;  Location: [facility] ORS;  Service: General;  Laterality: N/A;   INTRAMEDULLARY (IM) NAIL INTERTROCHANTERIC Right 03/24/2021   Procedure: INTRAMEDULLARY (IM) NAIL INTERTROCHANTRIC;  Surgeon: Oliver Barre, MD;  Location: [facility] ORS;  Service: Orthopedics;  Laterality: Right;   OPEN REDUCTION INTERNAL FIXATION (ORIF) TIBIA/FIBULA FRACTURE Right 2013   broke tibia and fibula after falling down stairs   PROLAPSED UTERINE FIBROID LIGATION  2015   TOTAL ABDOMINAL HYSTERECTOMY      Social History   Socioeconomic History   Marital status: Widowed    Spouse name: george    Number of children: 1   Years of education: Not on file   Highest education level: Not on file  Occupational History   Occupation: Retired    Comment: Textile  Tobacco Use   Smoking status: Never   Smokeless tobacco: Never  Vaping Use   Vaping status: Never Used  Substance and Sexual Activity   Alcohol use: No    Alcohol/week: 0.0 standard drinks of alcohol   Drug use: No   Sexual activity: Not Currently    Birth control/protection: Surgical, Post-menopausal    Comment: hyst  Other Topics Concern   Not on file  Social History Narrative   Married   No regular exercise   Social Determinants of Health   Financial Resource Strain: Low Risk  (07/04/2023)   Overall Financial Resource Strain (CARDIA)    Difficulty of Paying Living Expenses: Not hard at all  Food Insecurity: No  Food Insecurity (08/05/2023)   Hunger Vital Sign    Worried About Running Out of Food in the Last Year: Never true    Ran Out of Food in the Last Year: Never true  Transportation Needs: No Transportation Needs (08/05/2023)   PRAPARE - Administrator, Civil Service (Medical): No    Lack of Transportation (Non-Medical): No  Physical Activity: Unknown (07/04/2023)   Exercise Vital Sign    Days of Exercise per Week: 0 days    Minutes of Exercise per Session: Not on file  Stress: Stress Concern Present (07/04/2023)   [employer] of Occupational Health - Occupational Stress Questionnaire    Feeling of Stress : To some extent  Social Connections: Moderately Isolated (07/04/2023)   Social Connection and Isolation Panel [NHANES]    Frequency of Communication with Friends and Family: More than three times a week  Frequency of Social Gatherings with Friends and Family: More than three times a week    Attends Religious Services: More than 4 times per year    Active Member of [employer] or Organizations: No    Attends Banker Meetings: Not on file    Marital Status: Widowed  Intimate Partner Violence: Not At Risk (08/05/2023)   Humiliation, Afraid, Rape, and Kick questionnaire    Fear of Current or Ex-Partner: No    Emotionally Abused: No    Physically Abused: No    Sexually Abused: No   Family History  Problem Relation Age of Onset   Colon cancer Mother 57   Heart disease Mother    Osteoporosis Mother    Hip fracture Mother    Stroke Sister    Diabetes Sister    Osteoporosis Sister    Arthritis Sister    Uterine cancer Sister    Stroke Sister    Heart disease Father    Hyperlipidemia Brother    Hypertension Brother    Heart disease Brother    Stroke Brother    Heart disease Sister    Dementia Sister    Diabetes Son    Stroke Son    Heart attack Neg Hx       VITAL SIGNS BP 138/84   Pulse 80   Temp (!) 97.2 F (36.2 C)   Resp 16   Ht 4\' 8"  (1.422  m)   Wt 108 lb 9.6 oz (49.3 kg)   SpO2 95%   BMI 24.35 kg/m   Outpatient Encounter Medications as of 08/24/2023  Medication Sig   acetaminophen (TYLENOL) 325 MG tablet Take 650 mg by mouth every 6 (six) hours as needed.   ASPIRIN LOW DOSE 81 MG tablet TAKE 1 TABLET TWICE DAILY   bisacodyl (DULCOLAX) 10 MG suppository Place 1 suppository (10 mg total) rectally as needed for moderate constipation.   cholecalciferol (VITAMIN D3) 25 MCG (1000 UNIT) tablet Take 1,000 Units by mouth in the morning.   cyanocobalamin (VITAMIN B12) 1000 MCG tablet Take 1 tablet (1,000 mcg total) by mouth daily.   metoprolol succinate (TOPROL XL) 100 MG 24 hr tablet Take 1.5 tablets (150 mg total) by mouth 2 (two) times daily.   omeprazole (PRILOSEC) 20 MG capsule TAKE 1 CAPSULE EVERY DAY   polyethylene glycol (MIRALAX / GLYCOLAX) 17 g packet Take 17 g by mouth 2 (two) times daily as needed for mild constipation.   potassium chloride (KLOR-CON) 10 MEQ tablet Take 1 tablet (10 mEq total) by mouth daily. Take While taking Demadex/Torsemide   senna-docusate (SENOKOT-S) 8.6-50 MG tablet Take 1 tablet by mouth 2 (two) times daily.   simvastatin (ZOCOR) 20 MG tablet TAKE 1 TABLET EVERY DAY   torsemide (DEMADEX) 20 MG tablet Take 1 tablet (20 mg total) by mouth 2 (two) times daily. Breakfast and lunch   traZODone (DESYREL) 50 MG tablet Take 1 tablet (50 mg total) by mouth at bedtime.   [DISCONTINUED] oxyCODONE (OXY IR/ROXICODONE) 5 MG immediate release tablet Take 1 tablet (5 mg total) by mouth every 6 (six) hours as needed for severe pain or breakthrough pain.   No facility-administered encounter medications on file as of 08/24/2023.     SIGNIFICANT DIAGNOSTIC EXAMS  TODAY  08-04-33: wbc 9.4; hgb 16.0; hct 48.4; mcv 97.0 plt 130; glucose 159; bun 27; creat 1.16; k+ 3.8; na++ 135; ca 8.8; gfr 46; mag 2.2 08-08-23: wbc 7.2; hgb 13.5; hct 41.1; mcv 96.3 plt 100; glucose  104; bun 22; creat 0.86; k+ 3.9; na++ 134; ca 8.6;  gfr >60; phos 3.2 albumin 3.1   Review of Systems  Constitutional:  Negative for malaise/fatigue.  Respiratory:  Negative for cough and shortness of breath.   Cardiovascular:  Negative for chest pain, palpitations and leg swelling.  Gastrointestinal:  Negative for abdominal pain, constipation and heartburn.  Musculoskeletal:  Negative for back pain, joint pain and myalgias.  Skin: Negative.   Neurological:  Negative for dizziness.  Psychiatric/Behavioral:  The patient is not nervous/anxious.     Physical Exam Constitutional:      General: She is not in acute distress.    Appearance: She is well-developed. She is not diaphoretic.  Neck:     Thyroid: No thyromegaly.  Cardiovascular:     Rate and Rhythm: Normal rate and regular rhythm.     Pulses: Normal pulses.     Heart sounds: Normal heart sounds.  Pulmonary:     Effort: Pulmonary effort is normal. No respiratory distress.     Breath sounds: Normal breath sounds.  Abdominal:     General: Bowel sounds are normal. There is no distension.     Palpations: Abdomen is soft.     Tenderness: There is no abdominal tenderness.  Musculoskeletal:        General: Normal range of motion.     Cervical back: Neck supple.     Right lower leg: No edema.     Left lower leg: No edema.  Lymphadenopathy:     Cervical: No cervical adenopathy.  Skin:    General: Skin is warm and dry.  Neurological:     Mental Status: She is alert. Mental status is at baseline.  Psychiatric:        Mood and Affect: Mood normal.      ASSESSMENT/ PLAN:   Patient is being discharged with the following home health services:  pt/ot to evaluate and treat as indicated for gait balance strength adl training.   Patient is being discharged with the following durable medical equipment:  wheelchair   Patient has been advised to f/u with their PCP in 1-2 weeks to for a transitions of care visit.  Social services at their facility was responsible for arranging this  appointment.  Pt was provided with adequate prescriptions of noncontrolled medications to reach the scheduled appointment .  For controlled substances, a limited supply was provided as appropriate for the individual patient.  If the pt normally receives these medications from a pain clinic or has a contract with another physician, these medications should be received from that clinic or physician only).    A 30 day supply of her prescription medications have been sent to [facility] pharmacy   Time spent with patient 40 minutes: medications; home health dme.    Synthia Innocent NP [facility]  call [phone] \

## 2023-09-01 ENCOUNTER — Ambulatory Visit: Payer: Medicare HMO | Admitting: Family Medicine

## 2023-09-01 ENCOUNTER — Encounter: Payer: Self-pay | Admitting: Family Medicine

## 2023-09-01 VITALS — BP 125/77 | HR 91 | Temp 98.0°F | Ht <= 58 in | Wt 105.6 lb

## 2023-09-01 DIAGNOSIS — R42 Dizziness and giddiness: Secondary | ICD-10-CM | POA: Diagnosis not present

## 2023-09-01 DIAGNOSIS — I10 Essential (primary) hypertension: Secondary | ICD-10-CM | POA: Diagnosis not present

## 2023-09-01 DIAGNOSIS — E538 Deficiency of other specified B group vitamins: Secondary | ICD-10-CM | POA: Diagnosis not present

## 2023-09-01 DIAGNOSIS — I4891 Unspecified atrial fibrillation: Secondary | ICD-10-CM

## 2023-09-01 DIAGNOSIS — S3282XG Multiple fractures of pelvis without disruption of pelvic ring, subsequent encounter for fracture with delayed healing: Secondary | ICD-10-CM | POA: Diagnosis not present

## 2023-09-01 DIAGNOSIS — N1831 Chronic kidney disease, stage 3a: Secondary | ICD-10-CM | POA: Diagnosis not present

## 2023-09-01 DIAGNOSIS — E559 Vitamin D deficiency, unspecified: Secondary | ICD-10-CM

## 2023-09-01 MED ORDER — ACETAMINOPHEN 500 MG PO TABS
500.0000 mg | ORAL_TABLET | Freq: Four times a day (QID) | ORAL | 0 refills | Status: DC | PRN
Start: 2023-09-01 — End: 2023-09-12

## 2023-09-01 NOTE — Patient Instructions (Signed)
Generic medications are acceptable.
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
## 2023-09-02 ENCOUNTER — Telehealth: Payer: Self-pay | Admitting: Family Medicine

## 2023-09-02 DIAGNOSIS — F331 Major depressive disorder, recurrent, moderate: Secondary | ICD-10-CM | POA: Diagnosis not present

## 2023-09-02 DIAGNOSIS — J9601 Acute respiratory failure with hypoxia: Secondary | ICD-10-CM | POA: Diagnosis not present

## 2023-09-02 DIAGNOSIS — N1831 Chronic kidney disease, stage 3a: Secondary | ICD-10-CM | POA: Diagnosis not present

## 2023-09-02 DIAGNOSIS — I700 Atherosclerosis of aorta: Secondary | ICD-10-CM | POA: Diagnosis not present

## 2023-09-02 DIAGNOSIS — M800B2G Age-related osteoporosis with current pathological fracture, left pelvis, subsequent encounter for fracture with delayed healing: Secondary | ICD-10-CM | POA: Diagnosis not present

## 2023-09-02 DIAGNOSIS — I130 Hypertensive heart and chronic kidney disease with heart failure and stage 1 through stage 4 chronic kidney disease, or unspecified chronic kidney disease: Secondary | ICD-10-CM | POA: Diagnosis not present

## 2023-09-02 DIAGNOSIS — I4821 Permanent atrial fibrillation: Secondary | ICD-10-CM | POA: Diagnosis not present

## 2023-09-02 DIAGNOSIS — I5032 Chronic diastolic (congestive) heart failure: Secondary | ICD-10-CM | POA: Diagnosis not present

## 2023-09-02 DIAGNOSIS — M800AXG Age-related osteoporosis with current pathological fracture, other site, subsequent encounter for fracture with delayed healing: Secondary | ICD-10-CM | POA: Diagnosis not present

## 2023-09-02 LAB — CMP14+EGFR
ALT: 11 [IU]/L (ref 0–32)
AST: 20 [IU]/L (ref 0–40)
Albumin: 4.1 g/dL (ref 3.7–4.7)
Alkaline Phosphatase: 180 [IU]/L — ABNORMAL HIGH (ref 44–121)
BUN/Creatinine Ratio: 22 (ref 12–28)
BUN: 24 mg/dL (ref 8–27)
Bilirubin Total: 0.4 mg/dL (ref 0.0–1.2)
CO2: 27 mmol/L (ref 20–29)
Calcium: 9.4 mg/dL (ref 8.7–10.3)
Chloride: 91 mmol/L — ABNORMAL LOW (ref 96–106)
Creatinine, Ser: 1.11 mg/dL — ABNORMAL HIGH (ref 0.57–1.00)
Globulin, Total: 2.4 g/dL (ref 1.5–4.5)
Glucose: 130 mg/dL — ABNORMAL HIGH (ref 70–99)
Potassium: 3.7 mmol/L (ref 3.5–5.2)
Sodium: 138 mmol/L (ref 134–144)
Total Protein: 6.5 g/dL (ref 6.0–8.5)
eGFR: 49 mL/min/{1.73_m2} — ABNORMAL LOW (ref >59)

## 2023-09-02 LAB — CBC WITH DIFFERENTIAL/PLATELET
Basophils Absolute: 0.1 10*3/uL (ref 0.0–0.2)
Basos: 1 %
EOS (ABSOLUTE): 0.2 10*3/uL (ref 0.0–0.4)
Eos: 4 %
Hematocrit: 43.6 % (ref 34.0–46.6)
Hemoglobin: 13.7 g/dL (ref 11.1–15.9)
Immature Grans (Abs): 0 10*3/uL (ref 0.0–0.1)
Immature Granulocytes: 0 %
Lymphocytes Absolute: 0.9 10*3/uL (ref 0.7–3.1)
Lymphs: 16 %
MCH: 30.6 pg (ref 26.6–33.0)
MCHC: 31.4 g/dL — ABNORMAL LOW (ref 31.5–35.7)
MCV: 98 fL — ABNORMAL HIGH (ref 79–97)
Monocytes Absolute: 0.5 10*3/uL (ref 0.1–0.9)
Monocytes: 9 %
Neutrophils Absolute: 4.1 10*3/uL (ref 1.4–7.0)
Neutrophils: 70 %
Platelets: 168 10*3/uL (ref 150–450)
RBC: 4.47 x10E6/uL (ref 3.77–5.28)
RDW: 13.1 % (ref 11.7–15.4)
WBC: 5.9 10*3/uL (ref 3.4–10.8)

## 2023-09-02 LAB — VITAMIN D 25 HYDROXY (VIT D DEFICIENCY, FRACTURES): Vit D, 25-Hydroxy: 52.4 ng/mL (ref 30.0–100.0)

## 2023-09-02 LAB — VITAMIN B12: Vitamin B-12: 1570 pg/mL — ABNORMAL HIGH (ref 232–1245)

## 2023-09-05 NOTE — Telephone Encounter (Signed)
Written Rx with lab results and recommendations faxed to [facility] at [phone]

## 2023-09-05 NOTE — Telephone Encounter (Signed)
Will fax lab results with recommendations and written Senokot order to [facility] after [employer]. He is covering [facility] today.  Pt has a follow up with Marcelino Duster scheduled for 12/10 at 11:05. Repeat labs will be performed at that time.

## 2023-09-05 NOTE — Telephone Encounter (Signed)
Pts caregiver called stating that she needs PCP to write a written order to [facility] with what her lab results say to change and also says she needs PCP to write new Rx for the Senokot Rx for pt to only take PRN because right now they have pt taking 2 daily and its causing her to have lots of stomach cramps and diarrhea.   Please advise.

## 2023-09-05 NOTE — Telephone Encounter (Signed)
Yes okay to change the Senokot to as needed.  Please forward lab request to PCP, looks like she did not want to repeat anything for 6 to 8 weeks anyways so it can be forwarded to her for what labs to put in.

## 2023-09-06 ENCOUNTER — Encounter: Payer: Self-pay | Admitting: Family

## 2023-09-06 ENCOUNTER — Ambulatory Visit (INDEPENDENT_AMBULATORY_CARE_PROVIDER_SITE_OTHER): Payer: Medicare HMO | Admitting: Family

## 2023-09-06 VITALS — BP 124/84 | HR 96 | Temp 97.2°F | Ht <= 58 in

## 2023-09-06 DIAGNOSIS — R630 Anorexia: Secondary | ICD-10-CM

## 2023-09-06 DIAGNOSIS — R110 Nausea: Secondary | ICD-10-CM

## 2023-09-06 DIAGNOSIS — M545 Low back pain, unspecified: Secondary | ICD-10-CM | POA: Diagnosis not present

## 2023-09-06 LAB — MICROSCOPIC EXAMINATION
Epithelial Cells (non renal): NONE SEEN /[HPF] (ref 0–10)
RBC, Urine: NONE SEEN /[HPF] (ref 0–2)
Renal Epithel, UA: NONE SEEN /[HPF]
Yeast, UA: NONE SEEN

## 2023-09-06 LAB — URINALYSIS, COMPLETE
Bilirubin, UA: NEGATIVE
Glucose, UA: NEGATIVE
Ketones, UA: NEGATIVE
Nitrite, UA: NEGATIVE
Protein,UA: NEGATIVE
RBC, UA: NEGATIVE
Specific Gravity, UA: 1.01 (ref 1.005–1.030)
Urobilinogen, Ur: 0.2 mg/dL (ref 0.2–1.0)
pH, UA: 5.5 (ref 5.0–7.5)

## 2023-09-06 MED ORDER — ONDANSETRON HCL 4 MG PO TABS: 4.0000 mg | ORAL_TABLET | Freq: Three times a day (TID) | ORAL | 0 refills | Status: DC | PRN

## 2023-09-06 NOTE — Progress Notes (Signed)
Subjective:    Patient ID: UNNAMED FORSYTH, female    DOB: Sep 06, 1941, 82 y.o.   MRN: [id number]  Chief Complaint  Patient presents with   Nausea    Not eating much states all she is doing is taking medication    Back Pain   PT presents to the office today with nausea that started three days ago.  Back Pain This is a new problem. The current episode started yesterday. The problem occurs constantly. The problem is unchanged. The pain is present in the lumbar spine. The quality of the pain is described as aching. The pain is at a severity of 8/10. The pain is moderate. The symptoms are aggravated by twisting, lying down and bending. Associated symptoms include weakness. Treatments tried: tylenol. The treatment provided mild relief.      Review of Systems  Musculoskeletal:  Positive for back pain.  Neurological:  Positive for weakness.  All other systems reviewed and are negative.      Objective:   Physical Exam Vitals reviewed.  Constitutional:      General: She is not in acute distress.    Appearance: She is well-developed.  HENT:     Head: Normocephalic and atraumatic.     Right Ear: Tympanic membrane normal.     Left Ear: Tympanic membrane normal.  Eyes:     Pupils: Pupils are equal, round, and reactive to light.  Neck:     Thyroid: No thyromegaly.  Cardiovascular:     Rate and Rhythm: Normal rate and regular rhythm.     Heart sounds: Normal heart sounds. No murmur heard. Pulmonary:     Effort: Pulmonary effort is normal. No respiratory distress.     Breath sounds: Normal breath sounds. No wheezing.  Abdominal:     General: Bowel sounds are normal. There is no distension.     Palpations: Abdomen is soft.     Tenderness: There is no abdominal tenderness.  Musculoskeletal:        General: No tenderness.     Cervical back: Normal range of motion and neck supple.     Comments: Pain in lumbar with flexion, decrease ROM   Skin:    General: Skin is warm and dry.   Neurological:     Mental Status: She is alert and oriented to person, place, and time.     Cranial Nerves: No cranial nerve deficit.     Motor: Weakness present.     Deep Tendon Reflexes: Reflexes are normal and symmetric.  Psychiatric:        Behavior: Behavior normal.        Thought Content: Thought content normal.        Judgment: Judgment normal.      BP 124/84   Pulse 96   Temp (!) 97.2 F (36.2 C) (Temporal)   Ht 4\' 8"  (1.422 m)   SpO2 95%   BMI 23.68 kg/m      Assessment & Plan:  ARSULA JAVINS comes in today with chief complaint of Nausea (Not eating much states all she is doing is taking medication ) and Back Pain   Diagnosis and orders addressed:  1. Nausea - ondansetron (ZOFRAN) 4 MG tablet; Take 1 tablet (4 mg total) by mouth every 8 (eight) hours as needed for nausea or vomiting.  Dispense: 20 tablet; Refill: 0 - Urinalysis, Complete - Urine Culture  2. Acute bilateral low back pain without sciatica - Urinalysis, Complete - Urine Culture  3. Decreased  appetite  Zofran as needed  Urine pending  If urine is negative we can send in low dose pain medication for back pain Bland diet Could be UTI or Viral illness given decrease appetite and nausea Labs pending Follow up with PCP if symptoms worsen or do not improve    Jannifer Rodney, FNP

## 2023-09-06 NOTE — Patient Instructions (Signed)
Nausea, Adult Nausea is the feeling of having an upset stomach or that you are about to vomit. Nausea on its own is not usually a serious concern, but it may be an early sign of a more serious medical problem. As nausea gets worse, it can lead to vomiting. If vomiting develops, or if you are not able to drink enough fluids, you are at risk of becoming dehydrated. Dehydration can make you tired and thirsty, cause you to have a dry mouth, and decrease how often you urinate. Older adults and people with other diseases or a weak disease-fighting system (immune system) are at higher risk for dehydration. The main goals of treating your nausea are: To relieve your nausea. To limit repeated nausea episodes. To prevent vomiting and dehydration. Follow these instructions at home: Watch your symptoms for any changes. Tell your health care provider about them. Eating and drinking     Take an oral rehydration solution (ORS). This is a drink that is sold at pharmacies and retail stores. Drink clear fluids slowly and in small amounts as you are able. Clear fluids include water, ice chips, low-calorie sports drinks, and fruit juice that has water added (diluted fruit juice). Eat bland, easy-to-digest foods in small amounts as you are able. These foods include bananas, applesauce, rice, lean meats, toast, and crackers. Avoid drinking fluids that contain a lot of sugar or caffeine, such as energy drinks, sports drinks, and soda. Avoid alcohol. Avoid spicy or fatty foods. General instructions Take over-the-counter and prescription medicines only as told by your health care provider. Rest at home while you recover. Drink enough fluid to keep your urine pale yellow. Breathe slowly and deeply when you feel nauseous. Avoid smelling things that have strong odors. Wash your hands often using soap and water for at least 20 seconds. If soap and water are not available, use hand sanitizer. Make sure that everyone in  your household washes their hands well and often. Keep all follow-up visits. This is important. Contact a health care provider if: Your nausea gets worse. Your nausea does not go away after two days. You vomit multiple times. You cannot drink fluids without vomiting. You have any of the following: New symptoms. A fever. A headache. Muscle cramps. A rash. Pain while urinating. You feel light-headed or dizzy. Get help right away if: You have pain in your chest, neck, arm, or jaw. You feel extremely weak or you faint. You have vomit that is bright red or looks like coffee grounds. You have bloody or black stools (feces) or stools that look like tar. You have a severe headache, a stiff neck, or both. You have severe pain, cramping, or bloating in your abdomen. You have difficulty breathing or are breathing very quickly. Your heart is beating very quickly. Your skin feels cold and clammy. You feel confused. You have signs of dehydration, such as: Dark urine, very little urine, or no urine. Cracked lips. Dry mouth. Sunken eyes. Sleepiness. Weakness. These symptoms may be an emergency. Get help right away. Call 911. Do not wait to see if the symptoms will go away. Do not drive yourself to the hospital. Summary Nausea is the feeling that you have an upset stomach or that you are about to vomit. Nausea on its own is not usually a serious concern, but it may be an early sign of a more serious medical problem. If vomiting develops, or if you are not able to drink enough fluids, you are at risk of becoming   dehydrated. Follow recommendations for eating and drinking and take over-the-counter and prescription medicines only as told by your health care provider. Contact a health care provider right away if your symptoms worsen or you have new symptoms. Keep all follow-up visits. This is important. This information is not intended to replace advice given to you by your health care provider.  Make sure you discuss any questions you have with your health care provider. Document Revised: 05/01/2021 Document Reviewed: 05/01/2021 Elsevier Patient Education  2024 [employer].

## 2023-09-07 ENCOUNTER — Other Ambulatory Visit: Payer: Self-pay | Admitting: Family Medicine

## 2023-09-07 ENCOUNTER — Telehealth: Payer: Self-pay | Admitting: Family Medicine

## 2023-09-07 DIAGNOSIS — N3000 Acute cystitis without hematuria: Secondary | ICD-10-CM

## 2023-09-07 MED ORDER — SULFAMETHOXAZOLE-TRIMETHOPRIM 800-160 MG PO TABS
1.0000 | ORAL_TABLET | Freq: Two times a day (BID) | ORAL | 0 refills | Status: AC
Start: 2023-09-07 — End: 2023-09-14

## 2023-09-07 NOTE — Telephone Encounter (Signed)
Antibiotic sent to [facility], will change antibiotics if culture warrants

## 2023-09-07 NOTE — Telephone Encounter (Signed)
Culture not back yet only trace of leukocytes on in house urine. Treating provider off.

## 2023-09-07 NOTE — Telephone Encounter (Signed)
[facility] aware and [facility] aware and verbalizes understanding.

## 2023-09-08 ENCOUNTER — Encounter: Payer: Self-pay | Admitting: Family Medicine

## 2023-09-08 ENCOUNTER — Telehealth: Payer: Self-pay | Admitting: Family Medicine

## 2023-09-08 DIAGNOSIS — I4821 Permanent atrial fibrillation: Secondary | ICD-10-CM | POA: Diagnosis not present

## 2023-09-08 DIAGNOSIS — M800AXG Age-related osteoporosis with current pathological fracture, other site, subsequent encounter for fracture with delayed healing: Secondary | ICD-10-CM | POA: Diagnosis not present

## 2023-09-08 DIAGNOSIS — I130 Hypertensive heart and chronic kidney disease with heart failure and stage 1 through stage 4 chronic kidney disease, or unspecified chronic kidney disease: Secondary | ICD-10-CM | POA: Diagnosis not present

## 2023-09-08 DIAGNOSIS — I700 Atherosclerosis of aorta: Secondary | ICD-10-CM | POA: Diagnosis not present

## 2023-09-08 DIAGNOSIS — N1831 Chronic kidney disease, stage 3a: Secondary | ICD-10-CM | POA: Diagnosis not present

## 2023-09-08 DIAGNOSIS — M800B2G Age-related osteoporosis with current pathological fracture, left pelvis, subsequent encounter for fracture with delayed healing: Secondary | ICD-10-CM | POA: Diagnosis not present

## 2023-09-08 DIAGNOSIS — I5032 Chronic diastolic (congestive) heart failure: Secondary | ICD-10-CM | POA: Diagnosis not present

## 2023-09-08 DIAGNOSIS — J9601 Acute respiratory failure with hypoxia: Secondary | ICD-10-CM | POA: Diagnosis not present

## 2023-09-08 DIAGNOSIS — F331 Major depressive disorder, recurrent, moderate: Secondary | ICD-10-CM | POA: Diagnosis not present

## 2023-09-08 LAB — URINE CULTURE

## 2023-09-08 NOTE — Telephone Encounter (Signed)
Called Norma Barajas to inform her but she said we will need to call [facility] to give them an order, tried to call [facility] no answer and VM full

## 2023-09-08 NOTE — Telephone Encounter (Signed)
Lmtcb.

## 2023-09-08 NOTE — Telephone Encounter (Signed)
Please advise on interaction FYI on BP

## 2023-09-08 NOTE — Telephone Encounter (Signed)
Ok to hold potassium while on the bactrim.

## 2023-09-09 ENCOUNTER — Encounter (HOSPITAL_COMMUNITY): Payer: Self-pay

## 2023-09-09 ENCOUNTER — Emergency Department (HOSPITAL_COMMUNITY)
Admission: EM | Admit: 2023-09-09 | Discharge: 2023-09-09 | Disposition: A | Discharge status: Home / Self Care | Payer: Medicare HMO

## 2023-09-09 ENCOUNTER — Telehealth: Payer: Self-pay | Admitting: Family Medicine

## 2023-09-09 ENCOUNTER — Emergency Department (HOSPITAL_COMMUNITY): Payer: Medicare HMO

## 2023-09-09 ENCOUNTER — Other Ambulatory Visit: Payer: Self-pay

## 2023-09-09 DIAGNOSIS — I700 Atherosclerosis of aorta: Secondary | ICD-10-CM | POA: Diagnosis not present

## 2023-09-09 DIAGNOSIS — I509 Heart failure, unspecified: Secondary | ICD-10-CM | POA: Insufficient documentation

## 2023-09-09 DIAGNOSIS — E871 Hypo-osmolality and hyponatremia: Secondary | ICD-10-CM | POA: Diagnosis not present

## 2023-09-09 DIAGNOSIS — I4891 Unspecified atrial fibrillation: Secondary | ICD-10-CM | POA: Diagnosis not present

## 2023-09-09 DIAGNOSIS — Y9301 Activity, walking, marching and hiking: Secondary | ICD-10-CM | POA: Diagnosis not present

## 2023-09-09 DIAGNOSIS — W19XXXA Unspecified fall, initial encounter: Secondary | ICD-10-CM

## 2023-09-09 DIAGNOSIS — M25551 Pain in right hip: Secondary | ICD-10-CM | POA: Insufficient documentation

## 2023-09-09 DIAGNOSIS — I130 Hypertensive heart and chronic kidney disease with heart failure and stage 1 through stage 4 chronic kidney disease, or unspecified chronic kidney disease: Secondary | ICD-10-CM | POA: Diagnosis not present

## 2023-09-09 DIAGNOSIS — M25572 Pain in left ankle and joints of left foot: Secondary | ICD-10-CM | POA: Diagnosis not present

## 2023-09-09 DIAGNOSIS — F331 Major depressive disorder, recurrent, moderate: Secondary | ICD-10-CM | POA: Diagnosis not present

## 2023-09-09 DIAGNOSIS — I4811 Longstanding persistent atrial fibrillation: Secondary | ICD-10-CM

## 2023-09-09 DIAGNOSIS — M800AXG Age-related osteoporosis with current pathological fracture, other site, subsequent encounter for fracture with delayed healing: Secondary | ICD-10-CM | POA: Diagnosis not present

## 2023-09-09 DIAGNOSIS — R0902 Hypoxemia: Secondary | ICD-10-CM | POA: Diagnosis not present

## 2023-09-09 DIAGNOSIS — I5032 Chronic diastolic (congestive) heart failure: Secondary | ICD-10-CM | POA: Diagnosis not present

## 2023-09-09 DIAGNOSIS — Z7982 Long term (current) use of aspirin: Secondary | ICD-10-CM | POA: Insufficient documentation

## 2023-09-09 DIAGNOSIS — I110 Hypertensive heart disease with heart failure: Secondary | ICD-10-CM | POA: Diagnosis not present

## 2023-09-09 DIAGNOSIS — I4821 Permanent atrial fibrillation: Secondary | ICD-10-CM | POA: Diagnosis not present

## 2023-09-09 DIAGNOSIS — I480 Paroxysmal atrial fibrillation: Secondary | ICD-10-CM | POA: Diagnosis not present

## 2023-09-09 DIAGNOSIS — Z79899 Other long term (current) drug therapy: Secondary | ICD-10-CM | POA: Diagnosis not present

## 2023-09-09 DIAGNOSIS — E860 Dehydration: Secondary | ICD-10-CM | POA: Diagnosis not present

## 2023-09-09 DIAGNOSIS — N1831 Chronic kidney disease, stage 3a: Secondary | ICD-10-CM | POA: Diagnosis not present

## 2023-09-09 DIAGNOSIS — M800B2G Age-related osteoporosis with current pathological fracture, left pelvis, subsequent encounter for fracture with delayed healing: Secondary | ICD-10-CM | POA: Diagnosis not present

## 2023-09-09 DIAGNOSIS — J9601 Acute respiratory failure with hypoxia: Secondary | ICD-10-CM | POA: Diagnosis not present

## 2023-09-09 LAB — CBC WITH DIFFERENTIAL/PLATELET
Abs Immature Granulocytes: 0.01 10*3/uL (ref 0.00–0.07)
Basophils Absolute: 0.1 10*3/uL (ref 0.0–0.1)
Basophils Relative: 1 %
Eosinophils Absolute: 0.1 10*3/uL (ref 0.0–0.5)
Eosinophils Relative: 1 %
HCT: 40.4 % (ref 36.0–46.0)
Hemoglobin: 13.5 g/dL (ref 12.0–15.0)
Immature Granulocytes: 0 %
Lymphocytes Relative: 15 %
Lymphs Abs: 0.8 10*3/uL (ref 0.7–4.0)
MCH: 31.4 pg (ref 26.0–34.0)
MCHC: 33.4 g/dL (ref 30.0–36.0)
MCV: 94 fL (ref 80.0–100.0)
Monocytes Absolute: 0.7 10*3/uL (ref 0.1–1.0)
Monocytes Relative: 12 %
Neutro Abs: 4.1 10*3/uL (ref 1.7–7.7)
Neutrophils Relative %: 71 %
Platelets: 145 10*3/uL — ABNORMAL LOW (ref 150–400)
RBC: 4.3 MIL/uL (ref 3.87–5.11)
RDW: 15.7 % — ABNORMAL HIGH (ref 11.5–15.5)
WBC: 5.7 10*3/uL (ref 4.0–10.5)
nRBC: 0.3 % — ABNORMAL HIGH (ref 0.0–0.2)

## 2023-09-09 LAB — URINALYSIS, ROUTINE W REFLEX MICROSCOPIC
Bilirubin Urine: NEGATIVE
Glucose, UA: NEGATIVE mg/dL
Hgb urine dipstick: NEGATIVE
Ketones, ur: NEGATIVE mg/dL
Leukocytes,Ua: NEGATIVE
Nitrite: NEGATIVE
Protein, ur: NEGATIVE mg/dL
Specific Gravity, Urine: 1.003 — ABNORMAL LOW (ref 1.005–1.030)
pH: 6 (ref 5.0–8.0)

## 2023-09-09 LAB — COMPREHENSIVE METABOLIC PANEL
ALT: 16 U/L (ref 0–44)
AST: 22 U/L (ref 15–41)
Albumin: 3.7 g/dL (ref 3.5–5.0)
Alkaline Phosphatase: 122 U/L (ref 38–126)
Anion gap: 11 (ref 5–15)
BUN: 24 mg/dL — ABNORMAL HIGH (ref 8–23)
CO2: 27 mmol/L (ref 22–32)
Calcium: 8.8 mg/dL — ABNORMAL LOW (ref 8.9–10.3)
Chloride: 92 mmol/L — ABNORMAL LOW (ref 98–111)
Creatinine, Ser: 1.45 mg/dL — ABNORMAL HIGH (ref 0.44–1.00)
GFR, Estimated: 36 mL/min — ABNORMAL LOW (ref >60)
Glucose, Bld: 127 mg/dL — ABNORMAL HIGH (ref 70–99)
Potassium: 4.4 mmol/L (ref 3.5–5.1)
Sodium: 130 mmol/L — ABNORMAL LOW (ref 135–145)
Total Bilirubin: 0.6 mg/dL (ref 0.3–1.2)
Total Protein: 6.7 g/dL (ref 6.5–8.1)

## 2023-09-09 MED ORDER — SODIUM CHLORIDE 0.9 % IV BOLUS
1000.0000 mL | Freq: Once | INTRAVENOUS | Status: AC
  Administered 2023-09-09: 1000 mL via INTRAVENOUS

## 2023-09-09 MED ORDER — METOPROLOL SUCCINATE ER 50 MG PO TB24
150.0000 mg | ORAL_TABLET | Freq: Once | ORAL | Status: AC
  Administered 2023-09-09: 150 mg via ORAL
  Filled 2023-09-09: qty 3

## 2023-09-09 MED ORDER — LIDOCAINE 4 % EX PTCH: 1.0000 | MEDICATED_PATCH | CUTANEOUS | 0 refills | Status: AC

## 2023-09-09 MED ORDER — ACETAMINOPHEN 160 MG/5ML PO SOLN
650.0000 mg | Freq: Once | ORAL | Status: AC
  Administered 2023-09-09: 650 mg via ORAL
  Filled 2023-09-09: qty 20.3

## 2023-09-09 NOTE — ED Provider Notes (Signed)
Obtained patient on sign out. XR are without fracture. Patient ambulated without difficulty. Patient discharged with discharge instructions.    Judithann Sheen, PA 09/09/23 2333    Coral Spikes, DO 09/10/23 0120

## 2023-09-09 NOTE — Discharge Instructions (Signed)
Please continue to take your atrial fibrillation medications.  Please follow-up with cardiologist soon as possible.  You may take over-the-counter Tylenol for pain.  Also prescribed lidocaine patches to use as needed.  Return immediately if develop fevers, chills, chest pain, shortness of breath, any stop making urine.  Or you develop any new or worsening symptoms that are concerning to you.

## 2023-09-09 NOTE — ED Notes (Signed)
Pt assisted onto bedpan.

## 2023-09-09 NOTE — Telephone Encounter (Signed)
[facility] is calling to clarify if patient needs to take the BACTRIM for UTI.  Patient is refusing as patients niece says she does not have a UTI. Please call and let them know how to handle.  Thank you.

## 2023-09-09 NOTE — ED Notes (Signed)
ED Provider at bedside. 

## 2023-09-09 NOTE — Telephone Encounter (Signed)
Urine culture is negative, she does not need to take antibiotic.   Jannifer Rodney, FNP

## 2023-09-09 NOTE — ED Notes (Addendum)
Pt assisted with changing brief and put burgundy scrub pants on pt as hers are spoiled, pt family (niece) is going to transport pt back to [facility] of [address].

## 2023-09-09 NOTE — ED Notes (Signed)
Pt's brief wet. Incontinent care provided. Pt states she will let staff know when she has to void again to obtain urine.

## 2023-09-09 NOTE — Telephone Encounter (Signed)
[facility] aware will fax over a d/x

## 2023-09-09 NOTE — ED Notes (Signed)
Niece at bedside

## 2023-09-09 NOTE — ED Triage Notes (Signed)
Pt BIB EMS for fall and now complaining of rt hip pain. Per EMS pt has hx of a-fib and is at a regular rate 58-146.

## 2023-09-09 NOTE — ED Provider Notes (Signed)
[facility] AT [facility] Provider Note   CSN: [id number] Arrival date & time: 09/09/23  1613     History  Chief Complaint  Patient presents with   Norma Barajas    Norma Barajas is a 82 y.o. female.  Patient with history of paroxysmal atrial fibrillation,, congestive heart failure, hypertension, presents to the emergency department today for evaluation of right hip pain.  Symptoms started acutely earlier today after a fall.  Patient was walking with her walker and it slid out from under her causing her to fall.  She states that she was walking to the bathroom and the phone rang.  She denies hitting her head or losing consciousness.  Prior to this she was on an antibiotic for UTI and she felt that it was making her feel bad.  Denies vomiting or diarrhea.  No headache or confusion.       Home Medications Prior to Admission medications   Medication Sig Start Date End Date Taking? Authorizing Provider  acetaminophen (TYLENOL) 500 MG tablet Take 1 tablet (500 mg total) by mouth every 6 (six) hours as needed. 09/01/23   Sonny Masters, FNP  ASPIRIN LOW DOSE 81 MG tablet TAKE 1 TABLET TWICE DAILY 06/23/22   Gwenlyn Fudge, FNP  bisacodyl (DULCOLAX) 10 MG suppository Place 1 suppository (10 mg total) rectally as needed for moderate constipation. 07/06/23   Sonny Masters, FNP  cholecalciferol (VITAMIN D3) 25 MCG (1000 UNIT) tablet Take 1,000 Units by mouth in the morning.    [provider]  cyanocobalamin (VITAMIN B12) 1000 MCG tablet Take 1 tablet (1,000 mcg total) by mouth daily. 08/10/23 02/06/24  Gillis Santa, MD  metoprolol succinate (TOPROL XL) 100 MG 24 hr tablet Take 1.5 tablets (150 mg total) by mouth 2 (two) times daily. 08/24/23 08/23/24  Sharee Holster, NP  omeprazole (PRILOSEC) 20 MG capsule TAKE 1 CAPSULE EVERY DAY 07/25/23   Sonny Masters, FNP  ondansetron (ZOFRAN) 4 MG tablet Take 1 tablet (4 mg total) by mouth every 8 (eight) hours as needed  for nausea or vomiting. 09/06/23   Junie Spencer, FNP  polyethylene glycol (MIRALAX / GLYCOLAX) 17 g packet Take 17 g by mouth 2 (two) times daily as needed for mild constipation. 08/10/23   Gillis Santa, MD  potassium chloride (KLOR-CON) 10 MEQ tablet Take 1 tablet (10 mEq total) by mouth daily. Take While taking Demadex/Torsemide 05/19/23   Shon Hale, MD  senna-docusate (SENOKOT-S) 8.6-50 MG tablet Take 1 tablet by mouth 2 (two) times daily. 08/10/23   Gillis Santa, MD  simvastatin (ZOCOR) 20 MG tablet Take 1 tablet (20 mg total) by mouth daily. 08/24/23   Sharee Holster, NP  sulfamethoxazole-trimethoprim (BACTRIM DS) 800-160 MG tablet Take 1 tablet by mouth 2 (two) times daily for 7 days. 09/07/23 09/14/23  Sonny Masters, FNP  torsemide (DEMADEX) 20 MG tablet Take 1 tablet (20 mg total) by mouth 2 (two) times daily. Breakfast and lunch 08/24/23   Sharee Holster, NP  traZODone (DESYREL) 50 MG tablet Take 1 tablet (50 mg total) by mouth at bedtime. 08/24/23   Sharee Holster, NP      Allergies    Codeine, Doxycycline, Triamterene-hctz, Atorvastatin, Crestor [rosuvastatin calcium], Morphine, and Risedronate sodium    Review of Systems   Review of Systems  Physical Exam Updated Vital Signs BP (!) 134/100 (BP Location: Right Arm)   Pulse (!) 108   Temp 97.7 F (36.5  C) (Oral)   Resp (!) 30   SpO2 100%  Physical Exam Vitals and nursing note reviewed.  Constitutional:      General: She is not in acute distress.    Appearance: She is well-developed.  HENT:     Head: Normocephalic and atraumatic.     Right Ear: External ear normal.     Left Ear: External ear normal.     Nose: Nose normal.  Eyes:     Conjunctiva/sclera: Conjunctivae normal.  Cardiovascular:     Rate and Rhythm: Tachycardia present. Rhythm irregular.     Heart sounds: No murmur heard. Pulmonary:     Effort: No respiratory distress.     Breath sounds: No wheezing, rhonchi or rales.  Abdominal:      Palpations: Abdomen is soft.     Tenderness: There is no abdominal tenderness. There is no guarding or rebound.  Musculoskeletal:     Cervical back: Normal range of motion and neck supple.     Right hip: Tenderness present. Decreased range of motion.     Right lower leg: No edema.     Left lower leg: No edema.  Skin:    General: Skin is warm and dry.     Findings: No rash.  Neurological:     General: No focal deficit present.     Mental Status: She is alert. Mental status is at baseline.     Motor: No weakness.  Psychiatric:        Mood and Affect: Mood normal.     ED Results / Procedures / Treatments   Labs (all labs ordered are listed, but only abnormal results are displayed) Labs Reviewed  CBC WITH DIFFERENTIAL/PLATELET - Abnormal; Notable for the following components:      Result Value   RDW 15.7 (*)    Platelets 145 (*)    nRBC 0.3 (*)    All other components within normal limits  COMPREHENSIVE METABOLIC PANEL - Abnormal; Notable for the following components:   Sodium 130 (*)    Chloride 92 (*)    Glucose, Bld 127 (*)    BUN 24 (*)    Creatinine, Ser 1.45 (*)    Calcium 8.8 (*)    GFR, Estimated 36 (*)    All other components within normal limits  URINALYSIS, ROUTINE W REFLEX MICROSCOPIC - Abnormal; Notable for the following components:   Color, Urine STRAW (*)    Specific Gravity, Urine 1.003 (*)    All other components within normal limits    ED ECG REPORT   Date: 09/09/2023  Rate: 93  Rhythm: atrial fibrillation  QRS Axis: normal  Intervals: normal  ST/T Wave abnormalities: non-specific t-wave abnl  Conduction Disutrbances:none  Narrative Interpretation:   Old EKG Reviewed: changes noted, slower   I have personally reviewed the EKG tracing and agree with the computerized printout as noted.   Radiology No results found.  Procedures Procedures    Medications Ordered in ED Medications - No data to display  ED Course/ Medical Decision Making/  A&P    Patient seen and examined. History obtained directly from patient.   Labs/EKG: Ordered CBC, CMP, UA.  EKG personally reviewed and interpreted as above.  Imaging: Ordered x-ray of the right hip.  Medications/Fluids: None ordered  Most recent vital signs reviewed and are as follows: BP (!) 134/100 (BP Location: Right Arm)   Pulse (!) 108   Temp 97.7 F (36.5 C) (Oral)   Resp (!) 30  SpO2 100%   Initial impression: Hip pain after a fall, likely mechanical in nature.  6:57 PM Reassessment performed. Patient appears stable.  Family now bedside.  They do not have any acute concerns about her overall health.  They report that patient has been out of rehab for any about 2 weeks after a pelvis fracture.  Labs personally reviewed and interpreted including: CBC unremarkable; CMP mild elevation in creatinine from recent baseline, hyponatremia; UA without signs of infection.  Imaging personally visualized and interpreted including: X-ray, no obvious dislocation  Reviewed pertinent lab work and imaging with patient at bedside. Questions answered.   Most current vital signs reviewed and are as follows: BP (!) 128/94 (BP Location: Left Arm)   Pulse 83   Temp (!) 97.5 F (36.4 C) (Oral)   Resp (!) 24   SpO2 98%   Plan: Signout to [employer] and Dr.Young at shift change.   Will give fluid bolus for elevated creatinine, and evening home dose of metoprolol.                                 Medical Decision Making Amount and/or Complexity of Data Reviewed Labs: ordered. Radiology: ordered.   Patient with mechanical fall, no signs of head injury.  Awaiting results.  Patient does have A-fib with elevated rate.  Reasonably controlled while in the emergency department.  Labs appear reassuring.          Final Clinical Impression(s) / ED Diagnoses Final diagnoses:  None    Rx / DC Orders ED Discharge Orders     None         Renne Crigler, PA-C 09/09/23 1923     Coral Spikes, DO 09/10/23 0116

## 2023-09-09 NOTE — ED Notes (Signed)
Dr Maple Hudson instructed NT to stand pt up to ensure pt is able to stand and pivot before discharge

## 2023-09-12 ENCOUNTER — Other Ambulatory Visit: Payer: Self-pay | Admitting: Family Medicine

## 2023-09-12 DIAGNOSIS — S3282XG Multiple fractures of pelvis without disruption of pelvic ring, subsequent encounter for fracture with delayed healing: Secondary | ICD-10-CM

## 2023-09-12 NOTE — Telephone Encounter (Signed)
Attempted to contact [facility] again and was placed on hold for over 5 mins.

## 2023-09-13 DIAGNOSIS — M800B2G Age-related osteoporosis with current pathological fracture, left pelvis, subsequent encounter for fracture with delayed healing: Secondary | ICD-10-CM | POA: Diagnosis not present

## 2023-09-13 DIAGNOSIS — N1831 Chronic kidney disease, stage 3a: Secondary | ICD-10-CM | POA: Diagnosis not present

## 2023-09-13 DIAGNOSIS — I5032 Chronic diastolic (congestive) heart failure: Secondary | ICD-10-CM | POA: Diagnosis not present

## 2023-09-13 DIAGNOSIS — F331 Major depressive disorder, recurrent, moderate: Secondary | ICD-10-CM | POA: Diagnosis not present

## 2023-09-13 DIAGNOSIS — I700 Atherosclerosis of aorta: Secondary | ICD-10-CM | POA: Diagnosis not present

## 2023-09-13 DIAGNOSIS — J9601 Acute respiratory failure with hypoxia: Secondary | ICD-10-CM | POA: Diagnosis not present

## 2023-09-13 DIAGNOSIS — I4821 Permanent atrial fibrillation: Secondary | ICD-10-CM | POA: Diagnosis not present

## 2023-09-13 DIAGNOSIS — M800AXG Age-related osteoporosis with current pathological fracture, other site, subsequent encounter for fracture with delayed healing: Secondary | ICD-10-CM | POA: Diagnosis not present

## 2023-09-13 DIAGNOSIS — I130 Hypertensive heart and chronic kidney disease with heart failure and stage 1 through stage 4 chronic kidney disease, or unspecified chronic kidney disease: Secondary | ICD-10-CM | POA: Diagnosis not present

## 2023-09-15 ENCOUNTER — Emergency Department (HOSPITAL_COMMUNITY): Payer: Medicare HMO

## 2023-09-15 ENCOUNTER — Encounter (HOSPITAL_COMMUNITY): Payer: Self-pay | Admitting: Emergency Medicine

## 2023-09-15 ENCOUNTER — Inpatient Hospital Stay (HOSPITAL_COMMUNITY)
Admission: EM | Admit: 2023-09-15 | Discharge: 2023-09-18 | DRG: 309 | Disposition: A | Discharge status: Nursing Home (Includes ICF) | Payer: Medicare HMO | Source: Skilled Nursing Facility | Attending: Internal Medicine | Admitting: Internal Medicine

## 2023-09-15 ENCOUNTER — Other Ambulatory Visit: Payer: Self-pay

## 2023-09-15 DIAGNOSIS — E861 Hypovolemia: Secondary | ICD-10-CM | POA: Diagnosis present

## 2023-09-15 DIAGNOSIS — N184 Chronic kidney disease, stage 4 (severe): Secondary | ICD-10-CM | POA: Diagnosis present

## 2023-09-15 DIAGNOSIS — E871 Hypo-osmolality and hyponatremia: Secondary | ICD-10-CM | POA: Diagnosis not present

## 2023-09-15 DIAGNOSIS — I2510 Atherosclerotic heart disease of native coronary artery without angina pectoris: Secondary | ICD-10-CM | POA: Diagnosis present

## 2023-09-15 DIAGNOSIS — Z9841 Cataract extraction status, right eye: Secondary | ICD-10-CM

## 2023-09-15 DIAGNOSIS — L8990 Pressure ulcer of unspecified site, unspecified stage: Secondary | ICD-10-CM | POA: Insufficient documentation

## 2023-09-15 DIAGNOSIS — I4821 Permanent atrial fibrillation: Principal | ICD-10-CM | POA: Diagnosis present

## 2023-09-15 DIAGNOSIS — R932 Abnormal findings on diagnostic imaging of liver and biliary tract: Secondary | ICD-10-CM | POA: Diagnosis not present

## 2023-09-15 DIAGNOSIS — L89151 Pressure ulcer of sacral region, stage 1: Secondary | ICD-10-CM | POA: Diagnosis present

## 2023-09-15 DIAGNOSIS — R296 Repeated falls: Secondary | ICD-10-CM | POA: Diagnosis present

## 2023-09-15 DIAGNOSIS — Z833 Family history of diabetes mellitus: Secondary | ICD-10-CM

## 2023-09-15 DIAGNOSIS — E782 Mixed hyperlipidemia: Secondary | ICD-10-CM

## 2023-09-15 DIAGNOSIS — R160 Hepatomegaly, not elsewhere classified: Secondary | ICD-10-CM | POA: Diagnosis not present

## 2023-09-15 DIAGNOSIS — I10 Essential (primary) hypertension: Secondary | ICD-10-CM | POA: Diagnosis present

## 2023-09-15 DIAGNOSIS — Z800 Family history of malignant neoplasm of digestive organs: Secondary | ICD-10-CM

## 2023-09-15 DIAGNOSIS — Z8249 Family history of ischemic heart disease and other diseases of the circulatory system: Secondary | ICD-10-CM | POA: Diagnosis not present

## 2023-09-15 DIAGNOSIS — J90 Pleural effusion, not elsewhere classified: Secondary | ICD-10-CM | POA: Diagnosis not present

## 2023-09-15 DIAGNOSIS — Z9842 Cataract extraction status, left eye: Secondary | ICD-10-CM

## 2023-09-15 DIAGNOSIS — Z881 Allergy status to other antibiotic agents status: Secondary | ICD-10-CM

## 2023-09-15 DIAGNOSIS — E872 Acidosis, unspecified: Secondary | ICD-10-CM | POA: Diagnosis not present

## 2023-09-15 DIAGNOSIS — R7401 Elevation of levels of liver transaminase levels: Secondary | ICD-10-CM | POA: Diagnosis present

## 2023-09-15 DIAGNOSIS — I5032 Chronic diastolic (congestive) heart failure: Secondary | ICD-10-CM | POA: Diagnosis present

## 2023-09-15 DIAGNOSIS — Z66 Do not resuscitate: Secondary | ICD-10-CM | POA: Diagnosis present

## 2023-09-15 DIAGNOSIS — Z888 Allergy status to other drugs, medicaments and biological substances status: Secondary | ICD-10-CM

## 2023-09-15 DIAGNOSIS — I4891 Unspecified atrial fibrillation: Secondary | ICD-10-CM | POA: Diagnosis present

## 2023-09-15 DIAGNOSIS — R09A2 Foreign body sensation, throat: Secondary | ICD-10-CM | POA: Diagnosis present

## 2023-09-15 DIAGNOSIS — I130 Hypertensive heart and chronic kidney disease with heart failure and stage 1 through stage 4 chronic kidney disease, or unspecified chronic kidney disease: Secondary | ICD-10-CM | POA: Diagnosis not present

## 2023-09-15 DIAGNOSIS — Z8049 Family history of malignant neoplasm of other genital organs: Secondary | ICD-10-CM

## 2023-09-15 DIAGNOSIS — Z885 Allergy status to narcotic agent status: Secondary | ICD-10-CM | POA: Diagnosis not present

## 2023-09-15 DIAGNOSIS — K219 Gastro-esophageal reflux disease without esophagitis: Secondary | ICD-10-CM | POA: Diagnosis not present

## 2023-09-15 DIAGNOSIS — Z8262 Family history of osteoporosis: Secondary | ICD-10-CM

## 2023-09-15 DIAGNOSIS — I5082 Biventricular heart failure: Secondary | ICD-10-CM | POA: Diagnosis present

## 2023-09-15 DIAGNOSIS — S3282XA Multiple fractures of pelvis without disruption of pelvic ring, initial encounter for closed fracture: Secondary | ICD-10-CM | POA: Diagnosis not present

## 2023-09-15 DIAGNOSIS — E860 Dehydration: Secondary | ICD-10-CM | POA: Diagnosis present

## 2023-09-15 DIAGNOSIS — E7800 Pure hypercholesterolemia, unspecified: Secondary | ICD-10-CM | POA: Diagnosis present

## 2023-09-15 DIAGNOSIS — Z8719 Personal history of other diseases of the digestive system: Secondary | ICD-10-CM

## 2023-09-15 DIAGNOSIS — I959 Hypotension, unspecified: Secondary | ICD-10-CM | POA: Diagnosis not present

## 2023-09-15 DIAGNOSIS — Z823 Family history of stroke: Secondary | ICD-10-CM

## 2023-09-15 DIAGNOSIS — N179 Acute kidney failure, unspecified: Secondary | ICD-10-CM | POA: Diagnosis present

## 2023-09-15 DIAGNOSIS — Z8261 Family history of arthritis: Secondary | ICD-10-CM

## 2023-09-15 DIAGNOSIS — Z961 Presence of intraocular lens: Secondary | ICD-10-CM | POA: Diagnosis present

## 2023-09-15 DIAGNOSIS — I2729 Other secondary pulmonary hypertension: Secondary | ICD-10-CM | POA: Diagnosis not present

## 2023-09-15 DIAGNOSIS — R001 Bradycardia, unspecified: Secondary | ICD-10-CM | POA: Diagnosis not present

## 2023-09-15 DIAGNOSIS — R140 Abdominal distension (gaseous): Secondary | ICD-10-CM | POA: Diagnosis not present

## 2023-09-15 DIAGNOSIS — R7303 Prediabetes: Secondary | ICD-10-CM | POA: Diagnosis present

## 2023-09-15 DIAGNOSIS — Z83438 Family history of other disorder of lipoprotein metabolism and other lipidemia: Secondary | ICD-10-CM

## 2023-09-15 DIAGNOSIS — K7689 Other specified diseases of liver: Secondary | ICD-10-CM | POA: Diagnosis not present

## 2023-09-15 DIAGNOSIS — N189 Chronic kidney disease, unspecified: Secondary | ICD-10-CM

## 2023-09-15 DIAGNOSIS — R112 Nausea with vomiting, unspecified: Principal | ICD-10-CM

## 2023-09-15 DIAGNOSIS — E785 Hyperlipidemia, unspecified: Secondary | ICD-10-CM | POA: Diagnosis present

## 2023-09-15 DIAGNOSIS — R1310 Dysphagia, unspecified: Secondary | ICD-10-CM

## 2023-09-15 DIAGNOSIS — Z79899 Other long term (current) drug therapy: Secondary | ICD-10-CM

## 2023-09-15 DIAGNOSIS — Z7982 Long term (current) use of aspirin: Secondary | ICD-10-CM

## 2023-09-15 DIAGNOSIS — Z90710 Acquired absence of both cervix and uterus: Secondary | ICD-10-CM

## 2023-09-15 LAB — CBC
HCT: 43.4 % (ref 36.0–46.0)
Hemoglobin: 14.7 g/dL (ref 12.0–15.0)
MCH: 32.3 pg (ref 26.0–34.0)
MCHC: 33.9 g/dL (ref 30.0–36.0)
MCV: 95.4 fL (ref 80.0–100.0)
Platelets: 169 10*3/uL (ref 150–400)
RBC: 4.55 MIL/uL (ref 3.87–5.11)
RDW: 17.5 % — ABNORMAL HIGH (ref 11.5–15.5)
WBC: 6.5 10*3/uL (ref 4.0–10.5)
nRBC: 0.6 % — ABNORMAL HIGH (ref 0.0–0.2)

## 2023-09-15 LAB — COMPREHENSIVE METABOLIC PANEL
ALT: 80 U/L — ABNORMAL HIGH (ref 0–44)
AST: 92 U/L — ABNORMAL HIGH (ref 15–41)
Albumin: 3.4 g/dL — ABNORMAL LOW (ref 3.5–5.0)
Alkaline Phosphatase: 110 U/L (ref 38–126)
Anion gap: 13 (ref 5–15)
BUN: 37 mg/dL — ABNORMAL HIGH (ref 8–23)
CO2: 23 mmol/L (ref 22–32)
Calcium: 8.2 mg/dL — ABNORMAL LOW (ref 8.9–10.3)
Chloride: 88 mmol/L — ABNORMAL LOW (ref 98–111)
Creatinine, Ser: 2.22 mg/dL — ABNORMAL HIGH (ref 0.44–1.00)
GFR, Estimated: 21 mL/min — ABNORMAL LOW (ref >60)
Glucose, Bld: 103 mg/dL — ABNORMAL HIGH (ref 70–99)
Potassium: 5.1 mmol/L (ref 3.5–5.1)
Sodium: 124 mmol/L — ABNORMAL LOW (ref 135–145)
Total Bilirubin: 1.4 mg/dL — ABNORMAL HIGH (ref <1.2)
Total Protein: 6.1 g/dL — ABNORMAL LOW (ref 6.5–8.1)

## 2023-09-15 LAB — URINALYSIS, ROUTINE W REFLEX MICROSCOPIC
Bacteria, UA: NONE SEEN
Bilirubin Urine: NEGATIVE
Glucose, UA: NEGATIVE mg/dL
Hgb urine dipstick: NEGATIVE
Ketones, ur: NEGATIVE mg/dL
Nitrite: NEGATIVE
Protein, ur: 30 mg/dL — AB
Specific Gravity, Urine: 1.014 (ref 1.005–1.030)
pH: 5 (ref 5.0–8.0)

## 2023-09-15 LAB — MAGNESIUM: Magnesium: 2.4 mg/dL (ref 1.7–2.4)

## 2023-09-15 LAB — LACTIC ACID, PLASMA
Lactic Acid, Venous: 2.3 mmol/L (ref 0.5–1.9)
Lactic Acid, Venous: 2.1 mmol/L (ref 0.5–1.9)

## 2023-09-15 LAB — LIPASE, BLOOD: Lipase: 26 U/L (ref 11–51)

## 2023-09-15 LAB — TSH: TSH: 3.018 u[IU]/mL (ref 0.350–4.500)

## 2023-09-15 LAB — PHOSPHORUS: Phosphorus: 4.4 mg/dL (ref 2.5–4.6)

## 2023-09-15 LAB — MRSA NEXT GEN BY PCR, NASAL: MRSA by PCR Next Gen: NOT DETECTED

## 2023-09-15 MED ORDER — SIMVASTATIN 20 MG PO TABS: 20.0000 mg | ORAL_TABLET | Freq: Every day | ORAL | Status: DC

## 2023-09-15 MED ORDER — ASPIRIN 81 MG PO TBEC: 81.0000 mg | DELAYED_RELEASE_TABLET | Freq: Two times a day (BID) | ORAL | Status: DC

## 2023-09-15 MED ORDER — TRAZODONE HCL 50 MG PO TABS
50.0000 mg | ORAL_TABLET | Freq: Every day | ORAL | Status: DC
  Administered 2023-09-15 – 2023-09-17 (×3): 50 mg via ORAL
  Filled 2023-09-15 (×3): qty 1

## 2023-09-15 MED ORDER — ONDANSETRON HCL 4 MG PO TABS: 4.0000 mg | ORAL_TABLET | Freq: Four times a day (QID) | ORAL | Status: DC | PRN

## 2023-09-15 MED ORDER — MIDODRINE HCL 5 MG PO TABS
2.5000 mg | ORAL_TABLET | Freq: Three times a day (TID) | ORAL | Status: DC
  Administered 2023-09-16 – 2023-09-18 (×9): 2.5 mg via ORAL
  Filled 2023-09-15 (×9): qty 1

## 2023-09-15 MED ORDER — ACETAMINOPHEN 325 MG PO TABS
650.0000 mg | ORAL_TABLET | Freq: Four times a day (QID) | ORAL | Status: DC | PRN
  Administered 2023-09-15 – 2023-09-18 (×4): 650 mg via ORAL
  Filled 2023-09-15 (×4): qty 2

## 2023-09-15 MED ORDER — ACETAMINOPHEN 325 MG PO TABS
650.0000 mg | ORAL_TABLET | Freq: Once | ORAL | Status: AC
  Administered 2023-09-15: 650 mg via ORAL
  Filled 2023-09-15 (×2): qty 2

## 2023-09-15 MED ORDER — PANTOPRAZOLE SODIUM 40 MG PO TBEC
40.0000 mg | DELAYED_RELEASE_TABLET | Freq: Every day | ORAL | Status: DC
  Administered 2023-09-16 – 2023-09-18 (×3): 40 mg via ORAL
  Filled 2023-09-15 (×3): qty 1

## 2023-09-15 MED ORDER — SODIUM CHLORIDE 0.9 % IV SOLN: 250.0000 mL | INTRAVENOUS | Status: AC | PRN

## 2023-09-15 MED ORDER — METHOCARBAMOL 1000 MG/10ML IJ SOLN
500.0000 mg | Freq: Three times a day (TID) | INTRAMUSCULAR | Status: DC | PRN
  Administered 2023-09-15: 500 mg via INTRAVENOUS
  Filled 2023-09-15: qty 10

## 2023-09-15 MED ORDER — HEPARIN SODIUM (PORCINE) 5000 UNIT/ML IJ SOLN
5000.0000 [IU] | Freq: Three times a day (TID) | INTRAMUSCULAR | Status: DC
  Administered 2023-09-15 – 2023-09-18 (×9): 5000 [IU] via SUBCUTANEOUS
  Filled 2023-09-15 (×8): qty 1

## 2023-09-15 MED ORDER — ACETAMINOPHEN 650 MG RE SUPP: 650.0000 mg | Freq: Four times a day (QID) | RECTAL | Status: DC | PRN

## 2023-09-15 MED ORDER — SIMVASTATIN 20 MG PO TABS
20.0000 mg | ORAL_TABLET | Freq: Every day | ORAL | Status: DC
  Administered 2023-09-15: 20 mg
  Filled 2023-09-15: qty 1

## 2023-09-15 MED ORDER — METOPROLOL TARTRATE 5 MG/5ML IV SOLN
5.0000 mg | Freq: Once | INTRAVENOUS | Status: AC
  Administered 2023-09-15: 5 mg via INTRAVENOUS
  Filled 2023-09-15: qty 5

## 2023-09-15 MED ORDER — HYDROMORPHONE HCL 1 MG/ML IJ SOLN
0.5000 mg | Freq: Three times a day (TID) | INTRAMUSCULAR | Status: DC | PRN
  Administered 2023-09-15 – 2023-09-18 (×4): 0.5 mg via INTRAVENOUS
  Filled 2023-09-15 (×5): qty 0.5

## 2023-09-15 MED ORDER — SODIUM CHLORIDE 0.9 % IV SOLN: INTRAVENOUS | Status: AC

## 2023-09-15 MED ORDER — BISACODYL 10 MG RE SUPP: 10.0000 mg | Freq: Every day | RECTAL | Status: DC | PRN

## 2023-09-15 MED ORDER — METOPROLOL SUCCINATE ER 50 MG PO TB24
100.0000 mg | ORAL_TABLET | Freq: Two times a day (BID) | ORAL | Status: DC
  Administered 2023-09-16 – 2023-09-18 (×6): 100 mg via ORAL
  Filled 2023-09-15 (×6): qty 2

## 2023-09-15 MED ORDER — SODIUM CHLORIDE 0.9 % IV BOLUS
500.0000 mL | Freq: Once | INTRAVENOUS | Status: AC
  Administered 2023-09-15: 500 mL via INTRAVENOUS

## 2023-09-15 MED ORDER — METOPROLOL SUCCINATE ER 50 MG PO TB24
150.0000 mg | ORAL_TABLET | Freq: Every day | ORAL | Status: DC
  Administered 2023-09-15: 150 mg via ORAL
  Filled 2023-09-15: qty 3

## 2023-09-15 MED ORDER — DILTIAZEM HCL-DEXTROSE 125-5 MG/125ML-% IV SOLN (PREMIX)
5.0000 mg/h | INTRAVENOUS | Status: DC
  Administered 2023-09-15 – 2023-09-17 (×3): 5 mg/h via INTRAVENOUS
  Filled 2023-09-15 (×3): qty 125

## 2023-09-15 MED ORDER — LIDOCAINE 5 % EX PTCH
1.0000 | MEDICATED_PATCH | CUTANEOUS | Status: DC
  Administered 2023-09-15 – 2023-09-18 (×4): 1 via TRANSDERMAL
  Filled 2023-09-15 (×6): qty 1

## 2023-09-15 MED ORDER — SODIUM CHLORIDE 0.9% FLUSH: 3.0000 mL | INTRAVENOUS | Status: DC | PRN

## 2023-09-15 MED ORDER — ONDANSETRON HCL 4 MG/2ML IJ SOLN
4.0000 mg | Freq: Once | INTRAMUSCULAR | Status: AC
  Administered 2023-09-15: 4 mg via INTRAVENOUS
  Filled 2023-09-15: qty 2

## 2023-09-15 MED ORDER — ONDANSETRON HCL 4 MG/2ML IJ SOLN
4.0000 mg | Freq: Four times a day (QID) | INTRAMUSCULAR | Status: DC | PRN
Start: 2023-09-15 — End: 2023-09-18

## 2023-09-15 MED ORDER — ASPIRIN 81 MG PO CHEW
81.0000 mg | CHEWABLE_TABLET | Freq: Two times a day (BID) | ORAL | Status: DC
  Administered 2023-09-15 – 2023-09-16 (×2): 81 mg
  Filled 2023-09-15 (×2): qty 1

## 2023-09-15 MED ORDER — SODIUM CHLORIDE 0.9% FLUSH
3.0000 mL | Freq: Two times a day (BID) | INTRAVENOUS | Status: DC
  Administered 2023-09-16 – 2023-09-18 (×4): 3 mL via INTRAVENOUS

## 2023-09-15 MED ORDER — MIDODRINE HCL 5 MG PO TABS
5.0000 mg | ORAL_TABLET | Freq: Three times a day (TID) | ORAL | Status: DC
  Administered 2023-09-15: 5 mg via ORAL
  Filled 2023-09-15 (×2): qty 1

## 2023-09-15 MED ORDER — POLYETHYLENE GLYCOL 3350 17 G PO PACK: 17.0000 g | PACK | Freq: Two times a day (BID) | ORAL | Status: DC | PRN

## 2023-09-15 MED ORDER — CHLORHEXIDINE GLUCONATE CLOTH 2 % EX PADS
6.0000 | MEDICATED_PAD | Freq: Every day | CUTANEOUS | Status: DC
  Administered 2023-09-16 – 2023-09-18 (×3): 6 via TOPICAL

## 2023-09-15 NOTE — Assessment & Plan Note (Signed)
-  Patient with history of paroxysmal atrial fibrillation who has presented with A-fib with RVR. -Patient requires Cardizem drip initiation with also resumption of her chronic extended release 150 mg of metoprolol. -Patient heart rate has started to improved and [facility] has been contacted to place the patient in the hospital for further evaluation and management. -Check 2D echo and TSH; wean off Cardizem drip and adjust beta-blocker management -Cardiology consulted for further guidance. -Follow electrolytes and replete as needed; goal is for potassium above 4 magnesium above 2.

## 2023-09-15 NOTE — Assessment & Plan Note (Signed)
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

## 2023-09-15 NOTE — Assessment & Plan Note (Signed)
Continue PPI ?

## 2023-09-15 NOTE — ED Triage Notes (Addendum)
Pt BIB [facility] from [facility], c/o not feeling well x a few days and decreased appetite, pt reports emesis, facility did not report, v/s en route 118/86, 94%, HR 67, pt reports decreases urinary output and lower abd pain for "months"

## 2023-09-15 NOTE — ED Notes (Signed)
Pt family member pulled me to the side earlier to let me know of dark statements that the pt has been making recently. "If I had a gun, I would shoot you. Referring to [place]. and also that she wished she would just die.

## 2023-09-15 NOTE — ED Provider Notes (Signed)
[place] [facility] AT [facility] Provider Note   CSN: [id number] Arrival date & time: 09/15/23  0522     History  Chief Complaint  Patient presents with   decreased appetite    Norma Barajas is a 82 y.o. female.  HPI     This is an 82 year old female who presents with nausea, vomiting, abdominal pain.  Patient states that she just does not feel well.  She is not able to tell me exactly when it started.  She reports periumbilical and lower abdominal discomfort.  Has had nonbilious, nonbloody emesis.  Reports normal stools.  No chest pain or shortness of breath.  Is noted to be in atrial fibrillation with RVR.  Has a history of the same.  Patient is DNR.  Home Medications Prior to Admission medications   Medication Sig Start Date End Date Taking? Authorizing Provider  Acetaminophen Extra Strength 500 MG TABS TAKE 1 TABLET EVERY 6 HOURS AS NEEDED 09/12/23   Sonny Masters, FNP  ASPIRIN LOW DOSE 81 MG tablet TAKE 1 TABLET TWICE DAILY 06/23/22   Gwenlyn Fudge, FNP  bisacodyl (DULCOLAX) 10 MG suppository Place 1 suppository (10 mg total) rectally as needed for moderate constipation. 07/06/23   Sonny Masters, FNP  cholecalciferol (VITAMIN D3) 25 MCG (1000 UNIT) tablet Take 1,000 Units by mouth in the morning.    [provider]  cyanocobalamin (VITAMIN B12) 1000 MCG tablet Take 1 tablet (1,000 mcg total) by mouth daily. 08/10/23 02/06/24  Gillis Santa, MD  lidocaine (HM LIDOCAINE PATCH) 4 % Place 1 patch onto the skin daily for 15 days. 09/09/23 09/24/23  Coral Spikes, DO  metoprolol succinate (TOPROL XL) 100 MG 24 hr tablet Take 1.5 tablets (150 mg total) by mouth 2 (two) times daily. 08/24/23 08/23/24  Sharee Holster, NP  omeprazole (PRILOSEC) 20 MG capsule TAKE 1 CAPSULE EVERY DAY 07/25/23   Sonny Masters, FNP  ondansetron (ZOFRAN) 4 MG tablet Take 1 tablet (4 mg total) by mouth every 8 (eight) hours as needed for nausea or vomiting. 09/06/23    Junie Spencer, FNP  polyethylene glycol (MIRALAX / GLYCOLAX) 17 g packet Take 17 g by mouth 2 (two) times daily as needed for mild constipation. 08/10/23   Gillis Santa, MD  potassium chloride (KLOR-CON) 10 MEQ tablet Take 1 tablet (10 mEq total) by mouth daily. Take While taking Demadex/Torsemide 05/19/23   Shon Hale, MD  senna-docusate (SENOKOT-S) 8.6-50 MG tablet Take 1 tablet by mouth 2 (two) times daily. 08/10/23   Gillis Santa, MD  simvastatin (ZOCOR) 20 MG tablet Take 1 tablet (20 mg total) by mouth daily. 08/24/23   Sharee Holster, NP  torsemide (DEMADEX) 20 MG tablet Take 1 tablet (20 mg total) by mouth 2 (two) times daily. Breakfast and lunch 08/24/23   Sharee Holster, NP  traZODone (DESYREL) 50 MG tablet Take 1 tablet (50 mg total) by mouth at bedtime. 08/24/23   Sharee Holster, NP      Allergies    Codeine, Doxycycline, Triamterene-hctz, Atorvastatin, Crestor [rosuvastatin calcium], Morphine, and Risedronate sodium    Review of Systems   Review of Systems  Constitutional:  Negative for fever.  Respiratory:  Negative for shortness of breath.   Cardiovascular:  Negative for chest pain.  Gastrointestinal:  Positive for abdominal pain, nausea and vomiting. Negative for constipation and diarrhea.  All other systems reviewed and are negative.   Physical Exam Updated Vital Signs BP Marland Kitchen)  127/98   Pulse (!) 143   Temp (!) 97.5 F (36.4 C) (Oral)   Resp 17   Ht 1.422 m (4\' 8" )   Wt 54.2 kg   SpO2 96%   BMI 26.77 kg/m  Physical Exam Vitals and nursing note reviewed.  Constitutional:      Appearance: She is well-developed.     Comments: Elderly, chronically ill-appearing  HENT:     Head: Normocephalic and atraumatic.  Eyes:     Pupils: Pupils are equal, round, and reactive to light.  Cardiovascular:     Rate and Rhythm: Tachycardia present. Rhythm irregular.     Heart sounds: Normal heart sounds.  Pulmonary:     Effort: Pulmonary effort is normal. No  respiratory distress.     Breath sounds: No wheezing.  Abdominal:     General: Bowel sounds are normal.     Palpations: Abdomen is soft.     Tenderness: There is abdominal tenderness.     Comments: Generalized tenderness to palpation, no rebound or guarding  Musculoskeletal:     Cervical back: Neck supple.     Right lower leg: No edema.     Left lower leg: No edema.  Skin:    General: Skin is warm and dry.  Neurological:     Mental Status: She is alert and oriented to person, place, and time.  Psychiatric:        Mood and Affect: Mood normal.     ED Results / Procedures / Treatments   Labs (all labs ordered are listed, but only abnormal results are displayed) Labs Reviewed  CBC - Abnormal; Notable for the following components:      Result Value   RDW 17.5 (*)    nRBC 0.6 (*)    All other components within normal limits  COMPREHENSIVE METABOLIC PANEL - Abnormal; Notable for the following components:   Sodium 124 (*)    Chloride 88 (*)    Glucose, Bld 103 (*)    BUN 37 (*)    Creatinine, Ser 2.22 (*)    Calcium 8.2 (*)    Total Protein 6.1 (*)    Albumin 3.4 (*)    AST 92 (*)    ALT 80 (*)    Total Bilirubin 1.4 (*)    GFR, Estimated 21 (*)    All other components within normal limits  LIPASE, BLOOD  MAGNESIUM  URINALYSIS, ROUTINE W REFLEX MICROSCOPIC    EKG EKG Interpretation Date/Time:  Thursday September 15 2023 05:34:15 EST Ventricular Rate:  152 PR Interval:  160 QRS Duration:  78 QT Interval:  291 QTC Calculation: 463 R Axis:   172  Text Interpretation: Atrial fibrillation Right axis deviation Low voltage, precordial leads Repolarization abnormality, prob rate related Confirmed by Ross Marcus ([id number]) on 09/15/2023 6:19:56 AM  Radiology DG Abdomen Acute W/Chest  Result Date: 09/15/2023 CLINICAL DATA:  82 year old female with vomiting, tachycardia. EXAM: DG ABDOMEN ACUTE WITH 1 VIEW CHEST COMPARISON:  Portable chest 08/07/2023 and earlier. FINDINGS:  Portable AP upright view of the chest at 0627 hours. Small but increased since September bilateral pleural effusions. Stable cardiac size and mediastinal contours. Visualized tracheal air column is within normal limits. No superimposed pneumothorax, pulmonary edema, air bronchograms. Portable upright and supine AP views of the abdomen at 0629 hours. No pneumoperitoneum. Pronounced Calcified aortic atherosclerosis. Non obstructed bowel gas pattern. Paucity of bowel gas in the upper abdomen. Right femur ORIF. Chronic bilateral pubic rami fractures. Underlying osteopenia. No definite acute  osseous abnormality. IMPRESSION: 1. Small but increased pleural effusions since September, larger on the left. 2. Non obstructed bowel gas pattern. No pneumoperitoneum. 3. Osteopenia with right femur ORIF, chronic pubic rami fractures. Electronically Signed   By: Odessa Fleming M.D.   On: 09/15/2023 06:39    Procedures .Critical Care  Performed by: Shon Baton, MD Authorized by: Shon Baton, MD   Critical care provider statement:    Critical care time (minutes):  40   Critical care was necessary to treat or prevent imminent or life-threatening deterioration of the following conditions: Atrial fibrillation, AKI.   Critical care was time spent personally by me on the following activities:  Development of treatment plan with patient or surrogate, discussions with consultants, evaluation of patient's response to treatment, examination of patient, ordering and review of laboratory studies, ordering and review of radiographic studies, ordering and performing treatments and interventions, pulse oximetry, re-evaluation of patient's condition and review of old charts     Medications Ordered in ED Medications  metoprolol succinate (TOPROL-XL) 24 hr tablet 150 mg (150 mg Oral Given 09/15/23 0652)  ondansetron (ZOFRAN) injection 4 mg (has no administration in time range)  sodium chloride 0.9 % bolus 500 mL (500 mLs  Intravenous New Bag/Given 09/15/23 0559)  metoprolol tartrate (LOPRESSOR) injection 5 mg (5 mg Intravenous Given 09/15/23 0559)  metoprolol tartrate (LOPRESSOR) injection 5 mg (5 mg Intravenous Given 09/15/23 3016)    ED Course/ Medical Decision Making/ A&P                                 Medical Decision Making Amount and/or Complexity of Data Reviewed Labs: ordered. Radiology: ordered.  Risk Prescription drug management.   This patient presents to the ED for concern of nausea and vomiting, this involves an extensive number of treatment options, and is a complaint that carries with it a high risk of complications and morbidity.  I considered the following differential and admission for this acute, potentially life threatening condition.  The differential diagnosis includes gastritis, gastroenteritis, cholecystitis, obstructive pathology  MDM:    This is an 82 year old female who presents with nausea vomiting.  She is chronically ill-appearing.  Noted to be in atrial fibrillation with RVR with a rate in the 140s.  She denies any chest pain.  Workup initiated.  Given vomiting, she was given a small fluid bolus as this may be contributing to her A-fib.  She was given IV metoprolol.  Takes metoprolol at home.  Had transient improvement.  Blood pressures dropped slightly with metoprolol.  Will monitor closely.  Labs notable for hyponatremia with a sodium of 124.  Creatinine is 2.22 which is up from prior.  She has elevated LFTs and total bili at 1.4.  Acute abdominal series shows persistent pleural effusions which are slightly increased from prior but had been present on prior x-ray.  No evidence of obvious free air of the abdomen or obstructive pattern.  Will obtain CT scan.  Will hold off on aggressively treating A-fib until have a better picture of her overall abdominal pathology.  CT pending.  (Labs, imaging, consults)  Labs: I Ordered, and personally interpreted labs.  The pertinent results  include: CBC, CMP, lipase, magnesium  Imaging Studies ordered: I ordered imaging studies including acute abdominal series, CT pending I independently visualized and interpreted imaging. I agree with the radiologist interpretation  Additional history obtained from chart review.  External records  from outside source obtained and reviewed including prior evaluations  Cardiac Monitoring: The patient was maintained on a cardiac monitor.  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 Nonobstructive minimal CAD 10/2005.   Depression    Diastolic dysfunction    Grade 1. Ejection fraction 60-65%.   Dysrhythmia    a fib   Erosive esophagitis    Essential hypertension    Fracture of ramus of right pubis with routine healing 12/09/2020   GERD (gastroesophageal reflux disease)    Grade III hemorrhoids    History of hiatal hernia    History of prediabetes    Hypercholesterolemia    Internal hemorrhoids with complication 07/29/2008   OCT 2015 FLEX SIG/IH BANDING     Intertrochanteric fracture of femur (HCC)    5/16 - 03/26/2021 fracture sustained in a mechanical fall.  IM nailing performed 5/17 by Dr Thane Edu Warfarin held because of posttraumatic pelvic hematoma.  Postop DVT with 81 mg aspirin twice daily x28 days.   Major depressive disorder, recurrent, moderate (HCC) 12/09/2020   Migraine     "used to have them right bad; I don't now" (04/11/2015)   MITRAL REGURGITATION 04/27/2010   Qualifier: Diagnosis of  By: Eden Emms, MD, Harrington Challenger    Osteoporosis    Paroxysmal atrial fibrillation [facility])    Pelvic hematoma, female 03/27/2021   Sustained in mechanical fall.  Warfarin held.  IM nailing performed 5/17 with postop DVT prophylaxis 81 mg twice daily x28 days.   PONV (postoperative nausea and vomiting)    Vertigo      Medicines Meds ordered this encounter  Medications   sodium chloride 0.9 % bolus 500 mL   metoprolol tartrate (LOPRESSOR) injection 5 mg   metoprolol succinate (TOPROL-XL) 24 hr tablet 150 mg   metoprolol tartrate (LOPRESSOR) injection 5 mg   ondansetron (ZOFRAN) injection 4 mg    I have reviewed the patients home medicines and have made adjustments as needed  Problem List / ED Course: Problem List Items Addressed This Visit       Cardiovascular and Mediastinum   Atrial fibrillation with RVR (HCC)   Relevant Medications   metoprolol succinate (TOPROL-XL) 24 hr tablet 150 mg (Start on 09/15/2023 10:00 AM)   Other Visit Diagnoses     Nausea and vomiting, unspecified vomiting type    -  Primary   Hyponatremia       AKI (acute kidney injury) (HCC)                       Final Clinical Impression(s) / ED Diagnoses Final diagnoses:  Nausea and vomiting, unspecified vomiting type  Hyponatremia  AKI (acute kidney injury) (HCC)  Atrial fibrillation with RVR (HCC)    Rx / DC Orders ED Discharge Orders     None         Niani Mourer, Toni Amend  F, MD 09/15/23 [phone]

## 2023-09-15 NOTE — H&P (Signed)
History and Physical    Patient: Norma Barajas ZOX:[id number] DOB: Aug 23, 1941 DOA: 09/15/2023 DOS: the patient was seen and examined on 09/15/2023 PCP: Sonny Masters, FNP  Patient coming from: SNF  Chief Complaint:  Chief Complaint  Patient presents with   decreased appetite   HPI: Norma Barajas is a 82 y.o. female with medical history significant of chronic diastolic heart failure, paroxysmal atrial fibrillation, Esophageal from disease, hypertension, hyperlipidemia and depression/anxiety; who presented to the hospital secondary to not feeling well and noted to be in A-fib with RVR.  Per family in the room patient has been experiencing over the last 4 weeks decreased oral intake with concerns for not liking the food, bad taste and at times difficulty swallowing.  There has not been any pain in her chest but complaining of lower back pain after recent mechanical fall with pelvic bone fracture (no surgery intervention required).  While talking with patient she expressed not feeling well and reports intermittent nausea and midepigastric discomfort.  Patient was found to be in A-fib with RVR and blood work demonstrated acute kidney injury on chronic kidney disease and hyponatremia.  Gentle fluid resuscitation initiated and patient started on Cardizem drip to help with stabilizing her heart rate.  TRH consulted to place patient in the hospital for further evaluation and management.   Review of Systems: As mentioned in the history of present illness. All other systems reviewed and are negative. 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 Nonobstructive minimal CAD 10/2005.   Depression    Diastolic dysfunction    Grade 1. Ejection fraction 60-65%.   Dysrhythmia    a fib    Erosive esophagitis    Essential hypertension    Fracture of ramus of right pubis with routine healing 12/09/2020   GERD (gastroesophageal reflux disease)    Grade III hemorrhoids    History of hiatal hernia    History of prediabetes    Hypercholesterolemia    Internal hemorrhoids with complication 07/29/2008   OCT 2015 FLEX SIG/IH BANDING     Intertrochanteric fracture of femur (HCC)    5/16 - 03/26/2021 fracture sustained in a mechanical fall.  IM nailing performed 5/17 by Dr Thane Edu Warfarin held because of posttraumatic pelvic hematoma.  Postop DVT with 81 mg aspirin twice daily x28 days.   Major depressive disorder, recurrent, moderate (HCC) 12/09/2020   Migraine    "used to have them right bad; I don't now" (04/11/2015)   MITRAL REGURGITATION 04/27/2010   Qualifier: Diagnosis of  By: Eden Emms, MD, Harrington Challenger    Osteoporosis    Paroxysmal atrial fibrillation [facility])    Pelvic hematoma, female 03/27/2021   Sustained in mechanical fall.  Warfarin held.  IM nailing performed 5/17 with postop DVT prophylaxis 81 mg twice daily x28 days.   PONV (postoperative nausea and vomiting)    Vertigo    Past Surgical History:  Procedure Laterality Date   BRAVO [facility] STUDY  11/15/2012   Procedure: BRAVO PH STUDY;  Surgeon: West Bali, MD;  Location: [facility] ENDO SU[facility];  Service: Endoscopy;;   CARDIOVERSION N/A 05/16/2020   Procedure: CARDIOVERSION;  Surgeon: Little Ishikawa, MD;  Location: [facility] ENDOSCOPY;  Service: Cardiovascular;  Laterality: N/A;   CATARACT EXTRACTION W/ INTRAOCULAR LENS  IMPLANT, BILATERAL Bilateral    COLONOSCOPY  2008   Dr. Darrick Penna: internal hemorrhoids    DILATION AND CURETTAGE OF UTERUS     ESOPHAGEAL DILATION N/A 06/16/2021   Procedure: ESOPHAGEAL DILATION;  Surgeon: Dolores Frame, MD;  Location: [facility];  Service: Gastroenterology;  Laterality: N/A;   ESOPHAGOGASTRODUODENOSCOPY (EGD) WITH ESOPHAGEAL DILATION  2001   Dr. Arlyce Dice: erosive  esophagitis, esophageal stricture, duodenitis, s/p Savary dilation   ESOPHAGOGASTRODUODENOSCOPY (EGD) WITH ESOPHAGEAL DILATION  11/15/2012   [id number] web was found & MOST LIKELY CAUSE FOR DYAPHAGIA/Polyp was found in the gastric body and gastric fundus/ gastritis on bx   ESOPHAGOGASTRODUODENOSCOPY (EGD) WITH PROPOFOL N/A 06/16/2021   Procedure: ESOPHAGOGASTRODUODENOSCOPY (EGD) WITH PROPOFOL;  Surgeon: Dolores Frame, MD;  Location: [facility];  Service: Gastroenterology;  Laterality: N/A;  8:15   EYE SURGERY Bilateral    "laser OR after cataract OR; cause I couldn't see"   FLEXIBLE SIGMOIDOSCOPY N/A 08/22/2014   mild diverticulosis in sigmoid, moderate sized Grade 3 hemorrhoids s/p banding X 3.    FRACTURE SURGERY Right    below the knee - 2 bones broke has plates   HEMORRHOID BANDING N/A 08/22/2014   Procedure: HEMORRHOID BANDING;  Surgeon: West Bali, MD;  Location: [facility];  Service: Endoscopy;  Laterality: N/A;   HEMORRHOID SURGERY N/A 03/30/2018   Procedure: EXTENSIVE HEMORRHOIDECTOMY;  Surgeon: Lucretia Roers, MD;  Location: [facility] ORS;  Service: General;  Laterality: N/A;   INTRAMEDULLARY (IM) NAIL INTERTROCHANTERIC Right 03/24/2021   Procedure: INTRAMEDULLARY (IM) NAIL INTERTROCHANTRIC;  Surgeon: Oliver Barre, MD;  Location: [facility] ORS;  Service: Orthopedics;  Laterality: Right;   OPEN REDUCTION INTERNAL FIXATION (ORIF) TIBIA/FIBULA FRACTURE Right 2013   broke tibia and fibula after falling down stairs   PROLAPSED UTERINE FIBROID LIGATION  2015   TOTAL ABDOMINAL HYSTERECTOMY     Social History:  reports that she has never smoked. She has never used smokeless tobacco. She reports that she does not drink alcohol and does not use drugs.  Allergies  Allergen Reactions   Codeine Nausea And Vomiting   Doxycycline Other (See Comments)    Chest congestion   Triamterene-Hctz Other (See Comments)    weakness   Atorvastatin Other (See Comments)    Myalgias     Crestor [Rosuvastatin Calcium] Other (See Comments)    weakness   Morphine Nausea Only   Risedronate Sodium Other (See Comments)    ACTONEL - reflux    Family History  Problem Relation Age of Onset   Colon cancer Mother 65   Heart disease Mother    Osteoporosis Mother    Hip fracture Mother    Stroke Sister    Diabetes Sister    Osteoporosis Sister    Arthritis Sister    Uterine cancer Sister    Stroke Sister    Heart disease Father    Hyperlipidemia Brother    Hypertension Brother    Heart disease Brother    Stroke Brother    Heart disease Sister    Dementia Sister    Diabetes Son    Stroke Son    Heart attack Neg Hx     Prior to Admission medications   Medication Sig Start Date End Date Taking? Authorizing Provider  Acetaminophen Extra Strength 500 MG TABS TAKE 1 TABLET EVERY 6 HOURS AS NEEDED 09/12/23  Yes Rakes, Doralee Albino, FNP  ASPIRIN LOW DOSE 81 MG  tablet TAKE 1 TABLET TWICE DAILY 06/23/22  Yes Gwenlyn Fudge, FNP  bisacodyl (DULCOLAX) 10 MG suppository Place 1 suppository (10 mg total) rectally as needed for moderate constipation. 07/06/23  Yes Rakes, Doralee Albino, FNP  cholecalciferol (VITAMIN D3) 25 MCG (1000 UNIT) tablet Take 1,000 Units by mouth in the morning.   Yes [provider]  cyanocobalamin (VITAMIN B12) 1000 MCG tablet Take 1,000 mcg by mouth 4 (four) times a week.   Yes [provider]  lidocaine (HM LIDOCAINE PATCH) 4 % Place 1 patch onto the skin daily for 15 days. 09/09/23 09/24/23 Yes Young, Harmon Dun, DO  metoprolol succinate (TOPROL XL) 100 MG 24 hr tablet Take 1.5 tablets (150 mg total) by mouth 2 (two) times daily. Patient taking differently: Take 300 mg by mouth 2 (two) times daily. 08/24/23 08/23/24 Yes Sharee Holster, NP  omeprazole (PRILOSEC) 20 MG capsule TAKE 1 CAPSULE EVERY DAY 07/25/23  Yes Rakes, Doralee Albino, FNP  ondansetron (ZOFRAN) 4 MG tablet Take 1 tablet (4 mg total) by mouth every 8 (eight) hours as needed for nausea or  vomiting. 09/06/23  Yes Hawks, Christy A, FNP  polyethylene glycol (MIRALAX / GLYCOLAX) 17 g packet Take 17 g by mouth 2 (two) times daily as needed for mild constipation. 08/10/23  Yes Gillis Santa, MD  potassium chloride (KLOR-CON) 10 MEQ tablet Take 1 tablet (10 mEq total) by mouth daily. Take While taking Demadex/Torsemide 05/19/23  Yes Shon Hale, MD  simvastatin (ZOCOR) 20 MG tablet Take 1 tablet (20 mg total) by mouth daily. Patient taking differently: Take 20 mg by mouth at bedtime. 08/24/23  Yes Sharee Holster, NP  torsemide (DEMADEX) 20 MG tablet Take 1 tablet (20 mg total) by mouth 2 (two) times daily. Breakfast and lunch 08/24/23  Yes Sharee Holster, NP  traZODone (DESYREL) 50 MG tablet Take 1 tablet (50 mg total) by mouth at bedtime. 08/24/23  Yes Sharee Holster, NP    Physical Exam: Vitals:   09/15/23 1614 09/15/23 1643 09/15/23 1700 09/15/23 1800  BP:   103/76 106/61  Pulse:  74 80 68  Resp:  (!) 23 (!) 26 (!) 26  Temp: 97.9 F (36.6 C)     TempSrc: Oral     SpO2:  95% 96% 95%  Weight: 52.7 kg     Height: 4\' 8"  (1.422 m)      General exam: Alert, awake, oriented x 3; reporting no chest pain and is afebrile. Respiratory system: No wheezing, no crackles, no using accessory muscle.  Good saturation on room air. Cardiovascular system: Irregular, no rubs, no gallops, no JVD. Gastrointestinal system: Abdomen is nondistended, soft and with vague complaint of midepigastric/periumbilical area discomfort on palpation.  Positive bowel sounds appreciated.  No guarding. Central nervous system:  No focal neurological deficits. Extremities: No cyanosis, clubbing or edema. Skin: No petechiae.  Stage I coccyx pressure injury present at time of admission without signs of superimposed infection. Psychiatry: Flat affect appreciated.  Data Reviewed: CBC: WBC 6.5, hemoglobin 14.7 and platelet count 169K Comprehensive metabolic panel: Sodium 124, potassium 5.1, chloride 88, bicarb  23, BUN 37, creatinine 2.22, AST 92, ALT 80 and GFR 21. 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) -Overall compensated and at the moment patient appears to be dehydrated. -Judicious fluid resuscitation will be provided -Follow daily weights and strict I's and O's -Low-sodium diet discussed with patient. -At the moment we will continue holding diuretics.  HTN (hypertension) -Stable -Receiving Cardizem and drip and metoprolol -Follow vital signs.  GERD -Continue PPI.  Pressure injury of skin -Stage I coccyx pressure injury present at time of admission. -Continue constant repositioning and preventive measures.  Hyperlipidemia -Continue Zocor.  Atrial fibrillation with RVR (HCC) -Patient with history of paroxysmal atrial fibrillation who has presented with A-fib with RVR. -Patient requires Cardizem drip initiation with also resumption of her chronic extended release 150 mg of metoprolol. -Patient heart rate has started to improved and TRH has been contacted to place the patient in the hospital for further evaluation and management. -Check 2D echo and TSH; wean off Cardizem drip and adjust beta-blocker management -Cardiology consulted for further guidance. -Follow electrolytes and replete as needed; goal is for potassium above 4 magnesium above 2.  Dysphagia -Patient with prior history of a stricture -Will continue PPI -Speech therapy has been consulted -Dysphagia 2 diet at the moment order and GI  service has been invited to assess patient and determine the need for endoscopic evaluation and most likely dilatation.   Transaminitis/lactic acidosis -CT abdomen and pelvis demonstrating no acute abnormality -Most likely in the setting of dehydration -Judicious fluid resuscitation will be provided -Follow LFTs and lactic acid in the morning.   Advance Care Planning:   Code Status: Limited: Do not attempt resuscitation (DNR) -DNR-LIMITED -Do Not Intubate/DNI    Consults: cardiology   Family Communication: Patient niece Clayton Bibles) at bedside.  Severity of Illness: The appropriate patient status for this patient is INPATIENT. Inpatient status is judged to be reasonable and necessary in order to provide the required intensity of service to ensure the patient's safety. The patient's presenting symptoms, physical exam findings, and initial radiographic and laboratory data in the context of their chronic comorbidities is felt to place them at high risk for further clinical deterioration. Furthermore, it is not anticipated that the patient will be medically stable for discharge from the hospital within 2 midnights of admission.   * I certify that at the point of admission it is my clinical judgment that the patient will require inpatient hospital care spanning beyond 2 midnights from the point of admission due to high intensity of service, high risk for further deterioration and high frequency of surveillance required.*  Author: Vassie Loll, MD 09/15/2023 7:16 PM  For on call review [email or web address].

## 2023-09-15 NOTE — ED Provider Notes (Signed)
``   At change of shift care signed out from Dr. Wilkie Aye, patient is in atrial fibrillation with RVR, workup has been otherwise unremarkable except for acute kidney injury.  Imaging showed no signs of acute abnormalities in the abdomen or pelvis, I discussed the case with Dr. Gwenlyn Perking of the hospitalist service and requested consultation with cardiology, she is currently on Cardizem drip, she will be admitted to stepdown.   Eber Hong, MD 09/15/23 1255

## 2023-09-15 NOTE — Consult Note (Signed)
Cardiology Consultation   Patient ID: [name] MRN: [id number]; DOB: [birth year]  Admit date: 09/15/2023 Date of Consult: 09/15/2023  PCP:  Norma Masters, FNP   [facility] Providers Cardiologist:  Norma Rotunda, MD        Patient Profile:   Norma Barajas is a 82 y.o. female with a hx of permanent afib  who is being seen 09/15/2023 for the evaluation of afib with RVR at the request of Dr Norma Barajas.  History of Present Illness:   [name] 82 yo female history of permanent afib, HTN, Pulm HTN with RV dysfunction, presents with nausea and vomiting, abdominal pain. Found to be in afib with RVR in ER, cardiology consulted to help manage.   From prior notes regarding her afib cost issues with dofetilide. Had been on sotalol but had recurrence. Was on amiodarone, notes somewhat unclear when stopped, last appeared in clinic 09/2020. Multiple cardioversions, she eventually refused repeat attempts and was rate controlled. She also was taken off anticoag due to falls and thus no longer was a candidate for DCCVs. Had not been on anticoag due to frequent falls.    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 Nonobstructive minimal CAD 10/2005.   Depression    Diastolic dysfunction    Grade 1. Ejection fraction 60-65%.   Dysrhythmia    a fib   Erosive esophagitis    Essential hypertension    Fracture of ramus of right pubis with routine healing 12/09/2020   GERD (gastroesophageal reflux disease)    Grade III hemorrhoids    History of hiatal  hernia    History of prediabetes    Hypercholesterolemia    Internal hemorrhoids with complication 07/29/2008   OCT 2015 FLEX SIG/IH BANDING     Intertrochanteric fracture of femur (HCC)    5/16 - 03/26/2021 fracture sustained in a mechanical fall.  IM nailing performed 5/17 by Dr Thane Edu Warfarin held because of posttraumatic pelvic hematoma.  Postop DVT with 81 mg aspirin twice daily x28 days.   Major depressive disorder, recurrent, moderate (HCC) 12/09/2020   Migraine    "used to have them right bad; I don't now" (04/11/2015)   MITRAL REGURGITATION 04/27/2010   Qualifier: Diagnosis of  By: Eden Emms, MD, Harrington Challenger    Osteoporosis    Paroxysmal atrial fibrillation [facility])    Pelvic hematoma, female 03/27/2021   Sustained in mechanical fall.  Warfarin held.  IM nailing performed 5/17 with postop DVT prophylaxis 81 mg twice daily x28 days.   PONV (postoperative nausea and vomiting)    Vertigo     Past Surgical History:  Procedure Laterality Date   BRAVO [facility] STUDY  11/15/2012   Procedure: BRAVO PH STUDY;  Surgeon: West Bali, MD;  Location: [facility] ENDO SUITE;  Service: Endoscopy;;   CARDIOVERSION N/A 05/16/2020   Procedure: CARDIOVERSION;  Surgeon: Little Ishikawa, MD;  Location: [facility] ENDOSCOPY;  Service: Cardiovascular;  Laterality: N/A;  CATARACT EXTRACTION W/ INTRAOCULAR LENS  IMPLANT, BILATERAL Bilateral    COLONOSCOPY  2008   Dr. Darrick Penna: internal hemorrhoids    DILATION AND CURETTAGE OF UTERUS     ESOPHAGEAL DILATION N/A 06/16/2021   Procedure: ESOPHAGEAL DILATION;  Surgeon: Dolores Frame, MD;  Location: [facility] ENDO SUITE;  Service: Gastroenterology;  Laterality: N/A;   ESOPHAGOGASTRODUODENOSCOPY (EGD) WITH ESOPHAGEAL DILATION  2001   Dr. Arlyce Dice: erosive esophagitis, esophageal stricture, duodenitis, s/p Savary dilation   ESOPHAGOGASTRODUODENOSCOPY (EGD) WITH ESOPHAGEAL DILATION  11/15/2012   [id number] web was found & MOST LIKELY CAUSE FOR DYAPHAGIA/Polyp was  found in the gastric body and gastric fundus/ gastritis on bx   ESOPHAGOGASTRODUODENOSCOPY (EGD) WITH PROPOFOL N/A 06/16/2021   Procedure: ESOPHAGOGASTRODUODENOSCOPY (EGD) WITH PROPOFOL;  Surgeon: Dolores Frame, MD;  Location: [facility] ENDO SUITE;  Service: Gastroenterology;  Laterality: N/A;  8:15   EYE SURGERY Bilateral    "laser OR after cataract OR; cause I couldn't see"   FLEXIBLE SIGMOIDOSCOPY N/A 08/22/2014   mild diverticulosis in sigmoid, moderate sized Grade 3 hemorrhoids s/p banding X 3.    FRACTURE SURGERY Right    below the knee - 2 bones broke has plates   HEMORRHOID BANDING N/A 08/22/2014   Procedure: HEMORRHOID BANDING;  Surgeon: West Bali, MD;  Location: [facility] END[facility];  Service: Endoscopy;  Laterality: N/A;   HEMORRHOID SURGERY N/A 03/30/2018   Procedure: EXTENSIVE HEMORRHOIDECTOMY;  Surgeon: Lucretia Roers, MD;  Location: [facility] ORS;  Service: General;  Laterality: N/A;   INTRAMEDULLARY (IM) NAIL INTERTROCHANTERIC Right 03/24/2021   Procedure: INTRAMEDULLARY (IM) NAIL INTERTROCHANTRIC;  Surgeon: Oliver Barre, MD;  Location: [facility] ORS;  Service: Orthopedics;  Laterality: Right;   OPEN REDUCTION INTERNAL FIXATION (ORIF) TIBIA/FIBULA FRACTURE Right 2013   broke tibia and fibula after falling down stairs   PROLAPSED UTERINE FIBROID LIGATION  2015   TOTAL ABDOMINAL HYSTERECTOMY        Inpatient Medications: Scheduled Meds:  [START ON 09/16/2023] Chlorhexidine Gluconate Cloth  6 each Topical Q0600   metoprolol succinate  150 mg Oral Daily   Continuous Infusions:  sodium chloride 75 mL/hr at 09/15/23 1416   diltiazem (CARDIZEM) infusion 10 mg/hr (09/15/23 0848)   PRN Meds:   Allergies:    Allergies  Allergen Reactions   Codeine Nausea And Vomiting   Doxycycline Other (See Comments)    Chest congestion   Triamterene-Hctz Other (See Comments)    weakness   Atorvastatin Other (See Comments)    Myalgias    Crestor [Rosuvastatin Calcium] Other (See Comments)     weakness   Morphine Nausea Only   Risedronate Sodium Other (See Comments)    ACTONEL - reflux    Social History:   Social History   Socioeconomic History   Marital status: Widowed    Spouse name: george    Number of children: 1   Years of education: Not on file   Highest education level: Not on file  Occupational History   Occupation: Retired    Comment: Textile  Tobacco Use   Smoking status: Never   Smokeless tobacco: Never  Vaping Use   Vaping status: Never Used  Substance and Sexual Activity   Alcohol use: No    Alcohol/week: 0.0 standard drinks of alcohol   Drug use: No   Sexual activity: Not Currently    Birth control/protection: Surgical, Post-menopausal    Comment: hyst  Other Topics Concern   Not on file  Social History Narrative   Married  No regular exercise   Social Determinants of Health   Financial Resource Strain: Low Risk  (07/04/2023)   Overall Financial Resource Strain (CARDIA)    Difficulty of Paying Living Expenses: Not hard at all  Food Insecurity: No Food Insecurity (09/15/2023)   Hunger Vital Sign    Worried About Running Out of Food in the Last Year: Never true    Ran Out of Food in the Last Year: Never true  Transportation Needs: No Transportation Needs (09/15/2023)   PRAPARE - Administrator, Civil Service (Medical): No    Lack of Transportation (Non-Medical): No  Physical Activity: Unknown (07/04/2023)   Exercise Vital Sign    Days of Exercise per Week: 0 days    Minutes of Exercise per Session: Not on file  Stress: Stress Concern Present (07/04/2023)   [employer] of Occupational Health - Occupational Stress Questionnaire    Feeling of Stress : To some extent  Social Connections: Moderately Isolated (07/04/2023)   Social Connection and Isolation Panel [NHANES]    Frequency of Communication with Friends and Family: More than three times a week    Frequency of Social Gatherings with Friends and Family: More than three  times a week    Attends Religious Services: More than 4 times per year    Active Member of [employer] or Organizations: No    Attends Banker Meetings: Not on file    Marital Status: Widowed  Intimate Partner Violence: Not At Risk (09/15/2023)   Humiliation, Afraid, Rape, and Kick questionnaire    Fear of Current or Ex-Partner: No    Emotionally Abused: No    Physically Abused: No    Sexually Abused: No    Family History:    Family History  Problem Relation Age of Onset   Colon cancer Mother 52   Heart disease Mother    Osteoporosis Mother    Hip fracture Mother    Stroke Sister    Diabetes Sister    Osteoporosis Sister    Arthritis Sister    Uterine cancer Sister    Stroke Sister    Heart disease Father    Hyperlipidemia Brother    Hypertension Brother    Heart disease Brother    Stroke Brother    Heart disease Sister    Dementia Sister    Diabetes Son    Stroke Son    Heart attack Neg Hx      ROS:  Please see the history of present illness.   All other ROS reviewed and negative.     Physical Exam/Data:   Vitals:   09/15/23 1212 09/15/23 1215 09/15/23 1230 09/15/23 1417  BP:   112/76 103/76  Pulse:  78 79 77  Resp:  (!) 29 19 (!) 29  Temp: 97.9 F (36.6 C)     TempSrc: Oral     SpO2:  94% 97% 95%  Weight:      Height:       No intake or output data in the 24 hours ending 09/15/23 1454    09/15/2023    5:33 AM 09/06/2023   10:48 AM 09/01/2023    8:43 AM  Last 3 Weights  Weight (lbs) 119 lb 6.4 oz -- 105 lb 9.6 oz  Weight (kg) 54.159 kg -- 47.9 kg     Body mass index is 26.77 kg/m.  General:  Well nourished, well developed, in no acute distress HEENT: normal Neck: no JVD Vascular: No carotid bruits; Distal  pulses 2+ bilaterally Cardiac:  irreg, tachy Lungs:  clear to auscultation bilaterally, no wheezing, rhonchi or rales  Abd: soft, nontender, no hepatomegaly  Ext: no edema Musculoskeletal:  No deformities, BUE and BLE strength normal  and equal Skin: warm and dry  Neuro:  CNs 2-12 intact, no focal abnormalities noted Psych:  Normal affect     Laboratory Data:  High Sensitivity Troponin:  No results for input(s): "TROPONINIHS" in the last 720 hours.   Chemistry Recent Labs  Lab 09/09/23 1645 09/15/23 0613  NA 130* 124*  K 4.4 5.1  CL 92* 88*  CO2 27 23  GLUCOSE 127* 103*  BUN 24* 37*  CREATININE 1.45* 2.22*  CALCIUM 8.8* 8.2*  MG  --  2.4  GFRNONAA 36* 21*  ANIONGAP 11 13    Recent Labs  Lab 09/09/23 1645 09/15/23 0613  PROT 6.7 6.1*  ALBUMIN 3.7 3.4*  AST 22 92*  ALT 16 80*  ALKPHOS 122 110  BILITOT 0.6 1.4*   Lipids No results for input(s): "CHOL", "TRIG", "HDL", "LABVLDL", "LDLCALC", "CHOLHDL" in the last 168 hours.  Hematology Recent Labs  Lab 09/09/23 1645 09/15/23 0551  WBC 5.7 6.5  RBC 4.30 4.55  HGB 13.5 14.7  HCT 40.4 43.4  MCV 94.0 95.4  MCH 31.4 32.3  MCHC 33.4 33.9  RDW 15.7* 17.5*  PLT 145* 169   Thyroid No results for input(s): "TSH", "FREET4" in the last 168 hours.  BNPNo results for input(s): "BNP", "PROBNP" in the last 168 hours.  DDimer No results for input(s): "DDIMER" in the last 168 hours.   Radiology/Studies:  CT ABDOMEN PELVIS WO CONTRAST  Result Date: 09/15/2023 CLINICAL DATA:  Bowel obstruction suspected. EXAM: CT ABDOMEN AND PELVIS WITHOUT CONTRAST TECHNIQUE: Multidetector CT imaging of the abdomen and pelvis was performed following the standard protocol without IV contrast. RADIATION DOSE REDUCTION: This exam was performed according to the departmental dose-optimization program which includes automated exposure control, adjustment of the mA and/or kV according to patient size and/or use of iterative reconstruction technique. COMPARISON:  CT scan abdomen and pelvis from 04/29/2021. FINDINGS: Lower chest: There are subsegmental atelectatic changes in the visualized lung bases. No overt consolidation. There are bilateral small pleural effusions. The heart is  mildly enlarged in size. No pericardial effusion. Hepatobiliary: The liver is mildly enlarged in size measuring up to 18 cm in length, decreased since the prior study when it measured up to 21 cm in length, when remeasured in similar fashion. There is subtle liver surface irregularity/nodularity, raising the concern for liver parenchymal disease such as cirrhosis. Correlate clinically and with liver function tests. No suspicious mass. No intrahepatic or extrahepatic bile duct dilation. Partially contracted gallbladder containing calcified gallstones/sludge without imaging signs of acute cholecystitis. No pericholecystic inflammatory fat stranding. Pancreas: Unremarkable. No pancreatic ductal dilatation or surrounding inflammatory changes. Spleen: Within normal limits. No focal lesion. Adrenals/Urinary Tract: Redemonstration of a 1.6 x 2.2 cm right adrenal adenoma. Unremarkable left adrenal gland. No suspicious renal mass. No hydronephrosis. No renal or ureteric calculi. Partially distended urinary bladder. There is asymmetric mild-to-moderate thickening of the right bladder wall without perivesical fat stranding, which is essentially similar to the prior study. No focal bladder mass or calculi. Stomach/Bowel: No disproportionate dilation of the small or large bowel loops. No evidence of abnormal bowel wall thickening or inflammatory changes. The appendix was not visualized; however there is no acute inflammatory process in the right lower quadrant. Vascular/Lymphatic: There is small amount of ascites mainly in  the right paracolic gutter and in the dependent pelvis, grossly similar to the prior study. There is irregular hyperattenuating soft tissue along the right pelvic sidewall, which corresponds to cavity on the prior exam. Findings thus favor collapsed cavity wall. No pneumoperitoneum. No abdominal or pelvic lymphadenopathy, by size criteria. No aneurysmal dilation of the major abdominal arteries. There are  moderate peripheral atherosclerotic vascular calcifications of the aorta and its major branches. Reproductive: The uterus is surgically absent. No large adnexal mass. Other: There is a tiny fat containing umbilical hernia. There are bilateral tiny fat containing inguinal hernias. There is mild anasarca. Musculoskeletal: No suspicious osseous lesions. There is diffuse osteopenia of the visualized osseous structures. There are mild multilevel degenerative changes in the visualized spine. Since the prior study, there are new multiple compression deformities. There is moderate-to-severe compression deformity of T12 and L1 vertebrae. There is mild compression deformities of T11, L2, L3, L4 and L5 vertebra. There is mild retropulsion mainly at L1 vertebral level causing up to mild spinal canal narrowing. Right proximal femur intramedullary nail again seen. Subacute/healing fracture of left inferior pubic ramus and left anterior acetabulum noted, new since the prior study. Old healed fractures of left superior pubic ramus/pubic symphysis and right inferior pubic ramus again seen. IMPRESSION: *No acute inflammatory process identified within the abdomen or pelvis. No bowel obstruction. *Since the prior study from June 2022, there are new compression deformities of T11 through L5 vertebrae, as described above including mild retropulsion at L1 vertebral body level causing mild spinal canal narrowing. Consider nonemergent MRI lumbar spine for better evaluation, if clinically indicated. *Multiple subacute/healing and old pelvic fractures, as described above. *There are multiple other nonacute observations (such as bilateral pleural effusions, mild cardiomegaly, mild hepatomegaly - decreased since the prior study, liver surface irregularity/nodularity - concerning for liver parenchymal disease, cholelithiasis without acute cholecystitis, right adrenal adenoma, asymmetric right-sided urinary bladder wall thickening, small ascites,  etc., as described above. Electronically Signed   By: Jules Schick M.D.   On: 09/15/2023 11:36   DG Abdomen Acute W/Chest  Result Date: 09/15/2023 CLINICAL DATA:  82 year old female with vomiting, tachycardia. EXAM: DG ABDOMEN ACUTE WITH 1 VIEW CHEST COMPARISON:  Portable chest 08/07/2023 and earlier. FINDINGS: Portable AP upright view of the chest at 0627 hours. Small but increased since September bilateral pleural effusions. Stable cardiac size and mediastinal contours. Visualized tracheal air column is within normal limits. No superimposed pneumothorax, pulmonary edema, air bronchograms. Portable upright and supine AP views of the abdomen at 0629 hours. No pneumoperitoneum. Pronounced Calcified aortic atherosclerosis. Non obstructed bowel gas pattern. Paucity of bowel gas in the upper abdomen. Right femur ORIF. Chronic bilateral pubic rami fractures. Underlying osteopenia. No definite acute osseous abnormality. IMPRESSION: 1. Small but increased pleural effusions since September, larger on the left. 2. Non obstructed bowel gas pattern. No pneumoperitoneum. 3. Osteopenia with right femur ORIF, chronic pubic rami fractures. Electronically Signed   By: Odessa Fleming M.D.   On: 09/15/2023 06:39     Assessment and Plan:   1.Permanent afib - long history of afib as documented above - failed antiarrhythmic therapy,has just been rate controlled - off anticoag due to falls - rates likely exacerbated in setting of GI symptoms with N/V, hypovolemia - started on dilt gtt, given IV lorpessor 5mg  x 2 in ER. Received dose of toprol oral 150mg  - from chart on torpo. 150mg  bid at home, high dose though dosing references do suggest can go up to 400mg  daily. Would favor  lower toprol dose with added on scheduled diltiazem if needed. - rates should partly improve as systemic illness resolves, rehydrated.    -soft bp's, can start midodrine 5mg  tid for bp support while on av nodal agents for time being. Avoid digoxin  given renal failure. Could consider amio for rate control alone but some risk with conversion since she is not on anticoagulation of increased stroke risk. I think bp's will improve with volume resuscitation as well   2.N/V/abdominal pain - per primary team    3. Hyponatremia - likely hypovolemic, IVFs per primary team. Admit Cr 2.2, GFR 21. Baseline 1 month ago Cr 0.86, GFR >60   4. AKI - likely prerenal, IVFs per primary team.  5.Pullmonary HTN/RV dysfunction - followed by Dr Antoine Poche as outpatient - hypovolemic on presentation, close monitoring by rehydrating.  - JVD but likely secondary to her severe TR, she appears hypovolemic in general   For questions or updates, please contact [facility] Please consult [email or web address] for contact info under    Signed, Dina Rich, MD  09/15/2023 2:54 PM

## 2023-09-15 NOTE — Assessment & Plan Note (Signed)
-  Stage I coccyx pressure injury present at time of admission. -Continue constant repositioning and preventive measures.

## 2023-09-15 NOTE — ED Notes (Signed)
Pt able to provide urine so no in/out cath at this time.

## 2023-09-15 NOTE — Assessment & Plan Note (Signed)
-  Stable -Receiving Cardizem and drip and metoprolol -Follow vital signs.

## 2023-09-15 NOTE — Assessment & Plan Note (Signed)
-  Overall compensated and at the moment patient appears to be dehydrated. -Judicious fluid resuscitation will be provided -Follow daily weights and strict I's and O's -Low-sodium diet discussed with patient. -At the moment we will continue holding diuretics.

## 2023-09-15 NOTE — Assessment & Plan Note (Signed)
-  Patient with prior history of a stricture -Will continue PPI -Speech therapy has been consulted -Dysphagia 2 diet at the moment order and GI service has been invited to assess patient and determine the need for endoscopic evaluation and most likely dilatation.

## 2023-09-15 NOTE — Assessment & Plan Note (Signed)
Continue Zocor 

## 2023-09-16 ENCOUNTER — Telehealth (INDEPENDENT_AMBULATORY_CARE_PROVIDER_SITE_OTHER): Payer: Self-pay | Admitting: Gastroenterology

## 2023-09-16 ENCOUNTER — Inpatient Hospital Stay (HOSPITAL_COMMUNITY): Payer: Medicare HMO

## 2023-09-16 DIAGNOSIS — R1310 Dysphagia, unspecified: Secondary | ICD-10-CM | POA: Diagnosis not present

## 2023-09-16 DIAGNOSIS — I4891 Unspecified atrial fibrillation: Secondary | ICD-10-CM

## 2023-09-16 DIAGNOSIS — E782 Mixed hyperlipidemia: Secondary | ICD-10-CM | POA: Diagnosis not present

## 2023-09-16 DIAGNOSIS — K219 Gastro-esophageal reflux disease without esophagitis: Secondary | ICD-10-CM | POA: Diagnosis not present

## 2023-09-16 LAB — CBC
HCT: 41.4 % (ref 36.0–46.0)
Hemoglobin: 13.7 g/dL (ref 12.0–15.0)
MCH: 31.5 pg (ref 26.0–34.0)
MCHC: 33.1 g/dL (ref 30.0–36.0)
MCV: 95.2 fL (ref 80.0–100.0)
Platelets: 171 10*3/uL (ref 150–400)
RBC: 4.35 MIL/uL (ref 3.87–5.11)
RDW: 18.1 % — ABNORMAL HIGH (ref 11.5–15.5)
WBC: 6.2 10*3/uL (ref 4.0–10.5)
nRBC: 0.5 % — ABNORMAL HIGH (ref 0.0–0.2)

## 2023-09-16 LAB — ECHOCARDIOGRAM COMPLETE
AR max vel: 1.28 cm2
AV Area VTI: 1.37 cm2
AV Area mean vel: 1.25 cm2
AV Mean grad: 3.1 mm[Hg]
AV Peak grad: 5.5 mm[Hg]
Ao pk vel: 1.18 m/s
Area-P 1/2: 3.77 cm2
Height: 56 in
MV M vel: 4.53 m/s
MV Peak grad: 82.1 mm[Hg]
P 1/2 time: 521 ms
S' Lateral: 2.4 cm
Weight: 1858.92 [oz_av]

## 2023-09-16 LAB — BASIC METABOLIC PANEL
Anion gap: 10 (ref 5–15)
BUN: 32 mg/dL — ABNORMAL HIGH (ref 8–23)
CO2: 21 mmol/L — ABNORMAL LOW (ref 22–32)
Calcium: 7.9 mg/dL — ABNORMAL LOW (ref 8.9–10.3)
Chloride: 94 mmol/L — ABNORMAL LOW (ref 98–111)
Creatinine, Ser: 1.48 mg/dL — ABNORMAL HIGH (ref 0.44–1.00)
GFR, Estimated: 35 mL/min — ABNORMAL LOW (ref >60)
Glucose, Bld: 99 mg/dL (ref 70–99)
Potassium: 4.3 mmol/L (ref 3.5–5.1)
Sodium: 125 mmol/L — ABNORMAL LOW (ref 135–145)

## 2023-09-16 MED ORDER — SIMVASTATIN 20 MG PO TABS
20.0000 mg | ORAL_TABLET | Freq: Every day | ORAL | Status: DC
  Administered 2023-09-16 – 2023-09-18 (×3): 20 mg via ORAL
  Filled 2023-09-16 (×3): qty 1

## 2023-09-16 MED ORDER — ASPIRIN 81 MG PO CHEW
81.0000 mg | CHEWABLE_TABLET | Freq: Two times a day (BID) | ORAL | Status: DC
  Administered 2023-09-16 – 2023-09-18 (×4): 81 mg via ORAL
  Filled 2023-09-16 (×4): qty 1

## 2023-09-16 MED ORDER — DILTIAZEM HCL 30 MG PO TABS
30.0000 mg | ORAL_TABLET | Freq: Four times a day (QID) | ORAL | Status: DC
  Administered 2023-09-16 – 2023-09-17 (×5): 30 mg via ORAL
  Filled 2023-09-16 (×5): qty 1

## 2023-09-16 NOTE — Evaluation (Signed)
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 Nonobstructive minimal CAD 10/2005.   Depression    Diastolic dysfunction    Grade 1. Ejection fraction 60-65%.   Dysrhythmia    a fib   Erosive esophagitis    Essential hypertension    Fracture of ramus of right pubis with routine healing 12/09/2020   GERD (gastroesophageal reflux disease)    Grade III hemorrhoids    History of hiatal hernia    History of prediabetes    Hypercholesterolemia    Internal hemorrhoids with complication 07/29/2008   OCT 2015 FLEX SIG/IH BANDING     Intertrochanteric fracture of femur (HCC)    5/16 - 03/26/2021 fracture sustained in a mechanical fall.  IM nailing performed 5/17 by Dr Thane Edu Warfarin held because of posttraumatic pelvic hematoma.  Postop DVT with 81 mg aspirin twice daily x28 days.   Major depressive disorder, recurrent, moderate (HCC) 12/09/2020   Migraine    "used to have them right bad; I don't now" (04/11/2015)   MITRAL REGURGITATION 04/27/2010   Qualifier: Diagnosis of  By: Eden Emms, MD, Harrington Challenger    Osteoporosis    Paroxysmal atrial fibrillation [facility])    Pelvic hematoma, female 03/27/2021   Sustained in mechanical fall.  Warfarin held.  IM nailing performed 5/17 with postop DVT prophylaxis 81 mg twice daily x28 days.   PONV (postoperative nausea and vomiting)    Vertigo    Past Surgical History:  Past Surgical History:  Procedure Laterality Date   BRAVO PH STUDY   11/15/2012   Procedure: BRAVO PH STUDY;  Surgeon: West Bali, MD;  Location: [facility];  Service: Endoscopy;;   CARDIOVERSION N/A 05/16/2020   Procedure: CARDIOVERSION;  Surgeon: Little Ishikawa, MD;  Location: [facility] ENDOSCOPY;  Service: Cardiovascular;  Laterality: N/A;   CATARACT EXTRACTION W/ INTRAOCULAR LENS  IMPLANT, BILATERAL Bilateral    COLONOSCOPY  2008   Dr. Darrick Penna: internal hemorrhoids    DILATION AND CURETTAGE OF UTERUS     ESOPHAGEAL DILATION N/A 06/16/2021   Procedure: ESOPHAGEAL DILATION;  Surgeon: Dolores Frame, MD;  Location: [facility];  Service: Gastroenterology;  Laterality: N/A;   ESOPHAGOGASTRODUODENOSCOPY (EGD) WITH ESOPHAGEAL DILATION  2001   Dr. Arlyce Dice: erosive esophagitis, esophageal stricture, duodenitis, s/p Savary dilation   ESOPHAGOGASTRODUODENOSCOPY (EGD) WITH ESOPHAGEAL DILATION  11/15/2012   NWG:[id number] web was found & MOST LIKELY CAUSE FOR DYAPHAGIA/Polyp was found in the gastric body and gastric fundus/ gastritis on bx   ESOPHAGOGASTRODUODENOSCOPY (EGD) WITH PROPOFOL N/A 06/16/2021   Procedure: ESOPHAGOGASTRODUODENOSCOPY (EGD) WITH PROPOFOL;  Surgeon: Dolores Frame, MD;  Location: [facility];  Service: Gastroenterology;  Laterality: N/A;  8:15   EYE SURGERY Bilateral    "laser OR after cataract OR; cause I couldn't see"   FLEXIBLE SIGMOIDOSCOPY N/A 08/22/2014   mild diverticulosis in sigmoid, moderate sized Grade 3 hemorrhoids s/p banding X 3.    FRACTURE SURGERY Right  below the knee - 2 bones broke has plates   HEMORRHOID BANDING N/A 08/22/2014   Procedure: HEMORRHOID BANDING;  Surgeon: West Bali, MD;  Location: [facility];  Service: Endoscopy;  Laterality: N/A;   HEMORRHOID SURGERY N/A 03/30/2018   Procedure: EXTENSIVE HEMORRHOIDECTOMY;  Surgeon: Lucretia Roers, MD;  Location: [facility] OR[facility];  Service: General;  Laterality: N/A;   INTRAMEDULLARY (IM) NAIL INTERTROCHANTERIC Right 03/24/2021   Procedure:  INTRAMEDULLARY (IM) NAIL INTERTROCHANTRIC;  Surgeon: Oliver Barre, MD;  Location: [facility] OR[facility];  Service: Orthopedics;  Laterality: Right;   OPEN REDUCTION INTERNAL FIXATION (ORIF) TIBIA/FIBULA FRACTURE Right 2013   broke tibia and fibula after falling down stairs   PROLAPSED UTERINE FIBROID LIGATION  2015   TOTAL ABDOMINAL HYSTERECTOMY     HPI:  Norma Barajas is a 82 y.o. female with medical history significant of chronic diastolic heart failure, paroxysmal atrial fibrillation, Esophageal from disease, hypertension, hyperlipidemia and depression/anxiety; who presented to the hospital secondary to not feeling well and noted to be in A-fib with RVR.  Per family in the room patient has been experiencing over the last 4 weeks decreased oral intake with concerns for not liking the food, bad taste and at times difficulty swallowing.  There has not been any pain in her chest but complaining of lower back pain after recent mechanical fall with pelvic bone fracture (no surgery intervention required).  While talking with patient she expressed not feeling well and reports intermittent nausea and midepigastric discomfort.  Patient was found to be in A-fib with RVR and blood work demonstrated acute kidney injury on chronic kidney disease and hyponatremia.    Assessment / Plan / Recommendation  Clinical Impression  Clinical swallowing evaluation complete while Pt was sitting upright in bed; Pt seems frustrated when I ask questions but does answer some questions for me. Pt's nephew is at bedside and provides some history. Pt consumed limited trials of thin liquids and a few bites of ice cream without overt s/sx of oropharyngeal dysphagia. She reports pain with food "going down" and that she needs her "throat stretched". Recommend continue with D2/fine choped diet and thin liquids as Pt reports she is only able to tolerate very soft/minced food at this time. Recommend GI consult if not already in the works, as symptoms most  consistent with esophageal dysphagia and Pt with known esophageal dysphagia and hx of dilation. There are no further ST needs noted at this time, our service will sign off. Thank you for this referral. SLP Visit Diagnosis: Dysphagia, unspecified (R13.10)    Aspiration Risk  Mild aspiration risk    Diet Recommendation Dysphagia 2 (Fine chop);Thin liquid    Liquid Administration via: Cup;Straw Medication Administration: Whole meds with liquid Supervision: Patient able to self feed;Staff to assist with self feeding;Intermittent supervision to cue for compensatory strategies Compensations: Slow rate;Small sips/bites Postural Changes: Seated upright at 90 degrees    Other  Recommendations Recommended Consults: Consider GI evaluation;Consider esophageal assessment Oral Care Recommendations: Oral care BID    Recommendations for follow up therapy are one component of a multi-disciplinary discharge planning process, led by the attending physician.  Recommendations may be updated based on patient status, additional functional criteria and insurance authorization.  Follow up Recommendations No SLP follow up                   Prognosis Prognosis for improved oropharyngeal function: Fair      Swallow Study   General Date of Onset:  09/15/23 HPI: Norma Barajas is a 82 y.o. female with medical history significant of chronic diastolic heart failure, paroxysmal atrial fibrillation, Esophageal from disease, hypertension, hyperlipidemia and depression/anxiety; who presented to the hospital secondary to not feeling well and noted to be in A-fib with RVR.  Per family in the room patient has been experiencing over the last 4 weeks decreased oral intake with concerns for not liking the food, bad taste and at times difficulty swallowing.  There has not been any pain in her chest but complaining of lower back pain after recent mechanical fall with pelvic bone fracture (no surgery intervention required).  While  talking with patient she expressed not feeling well and reports intermittent nausea and midepigastric discomfort.  Patient was found to be in A-fib with RVR and blood work demonstrated acute kidney injury on chronic kidney disease and hyponatremia. Type of Study: Bedside Swallow Evaluation Previous Swallow Assessment: BSE complete 2021 Diet Prior to this Study: Dysphagia 2 (finely chopped);Thin liquids (Level 0) Temperature Spikes Noted: No Respiratory Status: Room air History of Recent Intubation: No Behavior/Cognition: Alert;Cooperative;Pleasant mood Oral Cavity Assessment: Within Functional Limits Oral Care Completed by SLP: Recent completion by staff Oral Cavity - Dentition: Dentures, top;Other (Comment) (partial) Vision: Functional for self-feeding Self-Feeding Abilities: Able to feed self Patient Positioning: Upright in bed Baseline Vocal Quality: Normal Volitional Cough: Strong Volitional Swallow: Able to elicit    Oral/Motor/Sensory Function Overall Oral Motor/Sensory Function: Within functional limits   Ice Chips Ice chips: Not tested   Thin Liquid Thin Liquid: Within functional limits    Nectar Thick Nectar Thick Liquid: Not tested   Honey Thick Honey Thick Liquid: Not tested   Puree Puree: Not tested   Solid     Solid: Not tested     Naydeline Morace H. Romie Levee, CCC-SLP Speech Language Pathologist  Georgetta Haber 09/16/2023,9:59 AM

## 2023-09-16 NOTE — Telephone Encounter (Signed)
Hi Tanya,   Can you please schedule an EGD in next 2 weeks? Dx: dysphagia. Room: 3. Needs cardiology clearance  Thanks,  Katrinka Blazing, MD Gastroenterology and Hepatology [facility]astro[facility]

## 2023-09-16 NOTE — Progress Notes (Signed)
Progress Note   Patient: Norma Barajas BJY:[id number] DOB: 1941/09/29 DOA: 09/15/2023     1 DOS: the patient was seen and examined on 09/16/2023   Brief hospital admission course: RASHAWNDA SAMFORD is a 82 y.o. female with medical history significant of chronic diastolic heart failure, paroxysmal atrial fibrillation, Esophageal from disease, hypertension, hyperlipidemia and depression/anxiety; who presented to the hospital secondary to not feeling well and noted to be in A-fib with RVR.  Per family in the room patient has been experiencing over the last 4 weeks decreased oral intake with concerns for not liking the food, bad taste and at times difficulty swallowing.  There has not been any pain in her chest but complaining of lower back pain after recent mechanical fall with pelvic bone fracture (no surgery intervention required).  While talking with patient she expressed not feeling well and reports intermittent nausea and midepigastric discomfort.  Patient was found to be in A-fib with RVR and blood work demonstrated acute kidney injury on chronic kidney disease and hyponatremia.   Gentle fluid resuscitation initiated and patient started on Cardizem drip to help with stabilizing her heart rate.  TRH consulted to place patient in the hospital for further evaluation and management.    Assessment and Plan: 1-A-fib with RVR -Patient with history of paroxysmal atrial fibrillation who presented with A-fib with RVR. -Continue Cardizem drip and adjusted dose of metoprolol as per cardiology recommendations -Overnight with soft blood pressure requiring the use of midodrine. -Continue close monitoring of patient vital sign -Continue adequate hydration and close monitoring of her heart rate. -Wean off Cardizem drip as tolerated.  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 -Avoid hypotension and the use of contrast.  3-chronic diastolic heart failure/right ventricular failure and pulmonary hypertension -Continue patient follow-up with cardiology service (Dr. Antoine Poche) -Holding diuretics at the moment in the setting of acute dehydration -Continue to follow daily weights, strict I's and O's and low-sodium diet.  4-hypertension -Overall stable but soft blood pressure while receiving Cardizem drip requiring midodrine -Continue to follow vital signs. -Adjust antihypertensive agents as needed.  5-gastroesophageal reflux disease/dysphagia -Continue PPI -Appreciate assistance and evaluation by speech therapy with recommendation for dysphagia 2 diet and thin liquids. -GI service consulted and has recommended outpatient follow-up for potential endoscopic evaluation and dilatation. -No oropharyngeal abnormalities appreciated by speech therapy assessment.  6-pressure injury of the skin -Stage I coccyx pressure present at time of admission -No signs of superimposed infection -Continue constant repositioning and preventive measure.  7-hyperlipidemia -Continue statin.  8-transaminitis/lactic acidosis -CT abdomen and pelvis demonstrating acute abnormality -Continue judicious fluid resuscitation and follow LFTs/lactic acid trend. -Overall numbers improving with fluid resuscitation.   Subjective:  No chest pain, no nausea, no vomiting.  Intermittently noncompetitive and agitated as per nursing staff report.  Patient is afebrile.  Physical Exam: Vitals:   09/16/23 1652 09/16/23 1715 09/16/23 1745 09/16/23 1815  BP: 130/65     Pulse: 90 68 (!) 41 81  Resp:  16 17 18   Temp:      TempSrc:      SpO2:  95% 96% 95%  Weight:      Height:       General exam: Alert, awake, no chest pain, no fever, no nausea or vomiting reported. Respiratory system: Good air movement bilaterally; no using accessory muscle.  Good saturation  on room  air. Cardiovascular system: Irregular, no rubs, no gallops Gastrointestinal system: Abdomen is nondistended, soft and nontender. No organomegaly or masses felt. Normal bowel sounds heard. Central nervous system: No focal neurological deficits. Extremities: No cyanosis, clubbing or edema. Skin: No petechiae.  Stage I coccyx pressure injury present at time of admission without signs of superimposed infection. Psychiatry: Agitated and intermittently noncompetitive with nursing staff.  Data Reviewed: CBC: White blood cell 6.2, hemoglobin 13.7 and platelet count 171K Basic metabolic panel: Sodium 125, potassium 4.3, chloride 94, bicarb 21, BUN 32, creatinine 1.48 and GFR 35.  Family Communication: No family at bedside.  Disposition: Status is: Inpatient Remains inpatient appropriate because: Continue IV therapy.   Planned Discharge Destination: Skilled nursing facility  CRITICAL CARE Performed by: Vassie Loll   Total critical care time: 55 minutes  Critical care time was exclusive of separately billable procedures and treating other patients.  Critical care was necessary to treat or prevent imminent or life-threatening deterioration.  Critical care was time spent personally by me on the following activities: development of treatment plan with patient and/or surrogate as well as nursing, discussions with consultants, evaluation of patient's response to treatment, examination of patient, obtaining history from patient or surrogate, ordering and performing treatments and interventions, ordering and review of laboratory studies, ordering and review of radiographic studies, pulse oximetry and re-evaluation of patient's condition.   Author: Vassie Loll, MD 09/16/2023 6:28 PM  For on call review [email or web address].

## 2023-09-16 NOTE — Care Management Important Message (Signed)
Important Message  Patient Details  Name: Norma Barajas MRN: [id number] Date of Birth: 01/28/1941   Important Message Given:        Corey Harold 09/16/2023, 4:42 PM

## 2023-09-16 NOTE — Consult Note (Signed)
Gastroenterology Consult   Referring Provider: No ref. provider found Primary Care Physician:  Sonny Masters, FNP Primary Gastroenterologist:  Dolores Frame, MD  Patient ID: [name]; [id number]; 11/10/1940   Admit date: 09/15/2023  LOS: 1 day   Date of Consultation: 09/16/2023  Reason for Consultation:  dysphagia, globus sensation, odynophagia  History of Present Illness   Norma Barajas is a 82 y.o. year old female with history of chronic diastolic heart failure, paroxysmal A-fib, esophageal webs s/p multiple prior dilations, HTN, HLD, and anxiety/depression who presented to the ED with lower back pain and some abdominal pain and noted to be in A-fib with RVR.  Family had also mentioned patient with decreased oral intake secondary to foods tasting bad and some dysphagia.  ED course: Noted to be tachycardic with irregular heart rhythm.  Started on Cardizem drip Given gentle IV fluid resuscitation CBC normal, sodium 124, creatinine 2.22, BUN 37, AST 92, ALT 80, alk phos 110, T. bili 1.4 Blood pressure on the lower end of normal earlier today systolics low 100s  Consult: Patient slightly difficult historian, was very agitated earlier today with trying to perform exam.  This afternoon she is much more calm and heart rate remains irregular but is in the 80-90 range.  She reports she has been having more trouble swallowing and some pain with swallowing over the last 3 months.  This occurs with solids as well as medications, no issues with liquids.  Denies any nausea.  Does have some occasional regurgitation/vomiting.  She has had some intermittent abdominal pain as well.  Denies any traditional reflux symptoms.  She denies taking any PPIs however it appears that she takes omeprazole 20 mg daily at home.  She is also on low-dose aspirin at home.  She states she did have a fall recently and has been having lower back pain.  EGD August 2022: - No endoscopic esophageal abnormality  to explain patient' s dysphagia s/p dilation  - Normal stomach.  - Normal examined duodenum.  - No specimens collected.  EGD January 2014: -Esophageal web, most likely cause for dysphagia s/p dilation -Multiple small gastric polyps -Nonerosive gastritis -Bravo capsule placed to evaluate for treatment failure for GERD   Speech eval completed today and recommended D2 finely chopped diet and thin liquids. Patient reported she needed her throat stretched and has pain with food going down. No significant oropharyngeal symptoms noted.    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 Nonobstructive minimal CAD 10/2005.   Depression    Diastolic dysfunction    Grade 1. Ejection fraction 60-65%.   Dysrhythmia    a fib   Erosive esophagitis    Essential hypertension    Fracture of ramus of right pubis with routine healing 12/09/2020   GERD (gastroesophageal reflux disease)    Grade III hemorrhoids    History of hiatal hernia    History of prediabetes    Hypercholesterolemia    Internal hemorrhoids with complication 07/29/2008   OCT 2015 FLEX SIG/IH BANDING     Intertrochanteric fracture of femur (HCC)    5/16 - 03/26/2021 fracture sustained in a mechanical fall.  IM nailing performed 5/17 by Dr Thane Edu Warfarin held because of  posttraumatic pelvic hematoma.  Postop DVT with 81 mg aspirin twice daily x28 days.   Major depressive disorder, recurrent, moderate (HCC) 12/09/2020   Migraine    "used to have them right bad; I don't now" (04/11/2015)   MITRAL REGURGITATION 04/27/2010   Qualifier: Diagnosis of  By: Eden Emms, MD, Harrington Challenger    Osteoporosis    Paroxysmal atrial fibrillation [facility])    Pelvic hematoma, female 03/27/2021   Sustained in mechanical fall.   Warfarin held.  IM nailing performed 5/17 with postop DVT prophylaxis 81 mg twice daily x28 days.   PONV (postoperative nausea and vomiting)    Vertigo     Past Surgical History:  Procedure Laterality Date   BRAVO [facility] STUDY  11/15/2012   Procedure: BRAVO PH STUDY;  Surgeon: West Bali, MD;  Location: [facility] ENDO SUITE;  Service: Endoscopy;;   CARDIOVERSION N/A 05/16/2020   Procedure: CARDIOVERSION;  Surgeon: Little Ishikawa, MD;  Location: [facility] ENDOSCOPY;  Service: Cardiovascular;  Laterality: N/A;   CATARACT EXTRACTION W/ INTRAOCULAR LENS  IMPLANT, BILATERAL Bilateral    COLONOSCOPY  2008   Dr. Darrick Penna: internal hemorrhoids    DILATION AND CURETTAGE OF UTERUS     ESOPHAGEAL DILATION N/A 06/16/2021   Procedure: ESOPHAGEAL DILATION;  Surgeon: Dolores Frame, MD;  Location: [facility] ENDO SUITE;  Service: Gastroenterology;  Laterality: N/A;   ESOPHAGOGASTRODUODENOSCOPY (EGD) WITH ESOPHAGEAL DILATION  2001   Dr. Arlyce Dice: erosive esophagitis, esophageal stricture, duodenitis, s/p Savary dilation   ESOPHAGOGASTRODUODENOSCOPY (EGD) WITH ESOPHAGEAL DILATION  11/15/2012   UEA:[id number] web was found & MOST LIKELY CAUSE FOR DYAPHAGIA/Polyp was found in the gastric body and gastric fundus/ gastritis on bx   ESOPHAGOGASTRODUODENOSCOPY (EGD) WITH PROPOFOL N/A 06/16/2021   Procedure: ESOPHAGOGASTRODUODENOSCOPY (EGD) WITH PROPOFOL;  Surgeon: Dolores Frame, MD;  Location: [facility] ENDO SUITE;  Service: Gastroenterology;  Laterality: N/A;  8:15   EYE SURGERY Bilateral    "laser OR after cataract OR; cause I couldn't see"   FLEXIBLE SIGMOIDOSCOPY N/A 08/22/2014   mild diverticulosis in sigmoid, moderate sized Grade 3 hemorrhoids s/p banding X 3.    FRACTURE SURGERY Right    below the knee - 2 bones broke has plates   HEMORRHOID BANDING N/A 08/22/2014   Procedure: HEMORRHOID BANDING;  Surgeon: West Bali, MD;  Location: [facility] ENDO SUITE;  Service: Endoscopy;  Laterality: N/A;   HEMORRHOID SURGERY  N/A 03/30/2018   Procedure: EXTENSIVE HEMORRHOIDECTOMY;  Surgeon: Lucretia Roers, MD;  Location: [facility] ORS;  Service: General;  Laterality: N/A;   INTRAMEDULLARY (IM) NAIL INTERTROCHANTERIC Right 03/24/2021   Procedure: INTRAMEDULLARY (IM) NAIL INTERTROCHANTRIC;  Surgeon: Oliver Barre, MD;  Location: [facility] ORS;  Service: Orthopedics;  Laterality: Right;   OPEN REDUCTION INTERNAL FIXATION (ORIF) TIBIA/FIBULA FRACTURE Right 2013   broke tibia and fibula after falling down stairs   PROLAPSED UTERINE FIBROID LIGATION  2015   TOTAL ABDOMINAL HYSTERECTOMY      Prior to Admission medications   Medication Sig Start Date End Date Taking? Authorizing Provider  Acetaminophen Extra Strength 500 MG TABS TAKE 1 TABLET EVERY 6 HOURS AS NEEDED 09/12/23  Yes Sonny Masters, FNP  ASPIRIN LOW DOSE 81 MG tablet TAKE 1 TABLET TWICE DAILY 06/23/22  Yes Gwenlyn Fudge, FNP  bisacodyl (DULCOLAX) 10 MG suppository Place 1 suppository (10 mg total) rectally as needed for moderate constipation. 07/06/23  Yes Rakes, Doralee Albino, FNP  cholecalciferol (VITAMIN D3) 25 MCG (1000 UNIT) tablet Take 1,000 Units by mouth in  the morning.   Yes [provider]  cyanocobalamin (VITAMIN B12) 1000 MCG tablet Take 1,000 mcg by mouth 4 (four) times a week.   Yes [provider]  lidocaine (HM LIDOCAINE PATCH) 4 % Place 1 patch onto the skin daily for 15 days. 09/09/23 09/24/23 Yes Young, Harmon Dun, DO  metoprolol succinate (TOPROL XL) 100 MG 24 hr tablet Take 1.5 tablets (150 mg total) by mouth 2 (two) times daily. Patient taking differently: Take 300 mg by mouth 2 (two) times daily. 08/24/23 08/23/24 Yes Sharee Holster, NP  omeprazole (PRILOSEC) 20 MG capsule TAKE 1 CAPSULE EVERY DAY 07/25/23  Yes Rakes, Doralee Albino, FNP  ondansetron (ZOFRAN) 4 MG tablet Take 1 tablet (4 mg total) by mouth every 8 (eight) hours as needed for nausea or vomiting. 09/06/23  Yes Hawks, Christy A, FNP  polyethylene glycol (MIRALAX / GLYCOLAX) 17 g  packet Take 17 g by mouth 2 (two) times daily as needed for mild constipation. 08/10/23  Yes Gillis Santa, MD  potassium chloride (KLOR-CON) 10 MEQ tablet Take 1 tablet (10 mEq total) by mouth daily. Take While taking Demadex/Torsemide 05/19/23  Yes Shon Hale, MD  simvastatin (ZOCOR) 20 MG tablet Take 1 tablet (20 mg total) by mouth daily. Patient taking differently: Take 20 mg by mouth at bedtime. 08/24/23  Yes Sharee Holster, NP  torsemide (DEMADEX) 20 MG tablet Take 1 tablet (20 mg total) by mouth 2 (two) times daily. Breakfast and lunch 08/24/23  Yes Sharee Holster, NP  traZODone (DESYREL) 50 MG tablet Take 1 tablet (50 mg total) by mouth at bedtime. 08/24/23  Yes Sharee Holster, NP    Current Facility-Administered Medications  Medication Dose Route Frequency Provider Last Rate Last Admin   0.9 %  sodium chloride infusion  250 mL Intravenous PRN Vassie Loll, MD       acetaminophen (TYLENOL) tablet 650 mg  650 mg Oral Q6H PRN Vassie Loll, MD   650 mg at 09/15/23 2120   Or   acetaminophen (TYLENOL) suppository 650 mg  650 mg Rectal Q6H PRN Vassie Loll, MD       aspirin chewable tablet 81 mg  81 mg Oral BID Vassie Loll, MD       bisacodyl (DULCOLAX) suppository 10 mg  10 mg Rectal Daily PRN Vassie Loll, MD       Chlorhexidine Gluconate Cloth 2 % PADS 6 each  6 each Topical Q0600 Vassie Loll, MD   6 each at 09/16/23 0543   diltiazem (CARDIZEM) 125 mg in dextrose 5% 125 mL (1 mg/mL) infusion  5-15 mg/hr Intravenous Continuous Eber Hong, MD 5 mL/hr at 09/16/23 0131 5 mg/hr at 09/16/23 0131   diltiazem (CARDIZEM) tablet 30 mg  30 mg Oral QID Antoine Poche, MD   30 mg at 09/16/23 0933   heparin injection 5,000 Units  5,000 Units Subcutaneous Q8H Vassie Loll, MD   5,000 Units at 09/16/23 0545   HYDROmorphone (DILAUDID) injection 0.5 mg  0.5 mg Intravenous Q8H PRN Vassie Loll, MD   0.5 mg at 09/16/23 0543   lidocaine (LIDODERM) 5 % 1 patch  1 patch  Transdermal Q24H Vassie Loll, MD   1 patch at 09/15/23 1805   methocarbamol (ROBAXIN) injection 500 mg  500 mg Intravenous Q8H PRN Vassie Loll, MD   500 mg at 09/15/23 1705   metoprolol succinate (TOPROL-XL) 24 hr tablet 100 mg  100 mg Oral BID Antoine Poche, MD   100 mg at  09/16/23 0926   midodrine (PROAMATINE) tablet 2.5 mg  2.5 mg Oral TID WC Antoine Poche, MD   2.5 mg at 09/16/23 0925   ondansetron (ZOFRAN) tablet 4 mg  4 mg Oral Q6H PRN Vassie Loll, MD       Or   ondansetron [facility]) injection 4 mg  4 mg Intravenous Q6H PRN Vassie Loll, MD       pantoprazole (PROTONIX) EC tablet 40 mg  40 mg Oral Daily Vassie Loll, MD   40 mg at 09/16/23 4098   polyethylene glycol (MIRALAX / GLYCOLAX) packet 17 g  17 g Oral BID PRN Vassie Loll, MD       simvastatin (ZOCOR) tablet 20 mg  20 mg Oral q1800 Vassie Loll, MD       sodium chloride flush (NS) 0.9 % injection 3 mL  3 mL Intravenous Q12H Vassie Loll, MD   3 mL at 09/16/23 1191   sodium chloride flush (NS) 0.9 % injection 3 mL  3 mL Intravenous PRN Vassie Loll, MD       traZODone (DESYREL) tablet 50 mg  50 mg Oral QHS Vassie Loll, MD   50 mg at 09/15/23 2119    Allergies as of 09/15/2023 - Review Complete 09/15/2023  Allergen Reaction Noted   Codeine Nausea And Vomiting 03/24/2018   Doxycycline Other (See Comments) 04/16/2020   Triamterene-hctz Other (See Comments) 02/18/2011   Atorvastatin Other (See Comments) 04/04/2015   Crestor [rosuvastatin calcium] Other (See Comments) 02/18/2011   Morphine Nausea Only    Risedronate sodium Other (See Comments) 02/18/2011    Family History  Problem Relation Age of Onset   Colon cancer Mother 16   Heart disease Mother    Osteoporosis Mother    Hip fracture Mother    Stroke Sister    Diabetes Sister    Osteoporosis Sister    Arthritis Sister    Uterine cancer Sister    Stroke Sister    Heart disease Father    Hyperlipidemia Brother    Hypertension Brother     Heart disease Brother    Stroke Brother    Heart disease Sister    Dementia Sister    Diabetes Son    Stroke Son    Heart attack Neg Hx     Social History   Socioeconomic History   Marital status: Widowed    Spouse name: george    Number of children: 1   Years of education: Not on file   Highest education level: Not on file  Occupational History   Occupation: Retired    Comment: Textile  Tobacco Use   Smoking status: Never   Smokeless tobacco: Never  Vaping Use   Vaping status: Never Used  Substance and Sexual Activity   Alcohol use: No    Alcohol/week: 0.0 standard drinks of alcohol   Drug use: No   Sexual activity: Not Currently    Birth control/protection: Surgical, Post-menopausal    Comment: hyst  Other Topics Concern   Not on file  Social History Narrative   Married   No regular exercise   Social Determinants of Health   Financial Resource Strain: Low Risk  (07/04/2023)   Overall Financial Resource Strain (CARDIA)    Difficulty of Paying Living Expenses: Not hard at all  Food Insecurity: No Food Insecurity (09/15/2023)   Hunger Vital Sign    Worried About Running Out of Food in the Last Year: Never true    Ran Out of Food  in the Last Year: Never true  Transportation Needs: No Transportation Needs (09/15/2023)   PRAPARE - Administrator, Civil Service (Medical): No    Lack of Transportation (Non-Medical): No  Physical Activity: Unknown (07/04/2023)   Exercise Vital Sign    Days of Exercise per Week: 0 days    Minutes of Exercise per Session: Not on file  Stress: Stress Concern Present (07/04/2023)   [employer] of Occupational Health - Occupational Stress Questionnaire    Feeling of Stress : To some extent  Social Connections: Moderately Isolated (07/04/2023)   Social Connection and Isolation Panel [NHANES]    Frequency of Communication with Friends and Family: More than three times a week    Frequency of Social Gatherings with Friends  and Family: More than three times a week    Attends Religious Services: More than 4 times per year    Active Member of [employer] or Organizations: No    Attends Banker Meetings: Not on file    Marital Status: Widowed  Intimate Partner Violence: Not At Risk (09/15/2023)   Humiliation, Afraid, Rape, and Kick questionnaire    Fear of Current or Ex-Partner: No    Emotionally Abused: No    Physically Abused: No    Sexually Abused: No     Review of Systems   Gen: + lack of appetite. Denies any fever, chills, change in weight or weight loss CV: Denies chest pain, heart palpitations, syncope, edema  Resp: Denies shortness of breath with rest, cough, wheezing, coughing up blood, and pleurisy. GI: see HPI GU : Denies urinary burning, blood in urine, urinary frequency, and urinary incontinence. MS: Denies joint pain, limitation of movement, swelling, cramps, and atrophy.  Derm: Denies rash, itching, dry skin, hives. Psych: + Anxiety/depression, confusion. Denies hallucinations Heme: Denies bruising or bleeding Neuro:  Denies any headaches, dizziness, paresthesias, shaking  Physical Exam   Vital Signs in last 24 hours: Temp:  [97.3 F (36.3 C)-97.9 F (36.6 C)] 97.3 F (36.3 C) (11/08 0752) Pulse Rate:  [29-128] 98 (11/08 0930) Resp:  [12-29] 14 (11/08 0930) BP: (99-136)/(49-102) 128/85 (11/08 0933) SpO2:  [92 %-98 %] 95 % (11/08 0930) Weight:  [52.7 kg] 52.7 kg (11/07 1614) Last BM Date : 09/15/23  General:   Alert, pleasant and cooperative in NAD Head:  Normocephalic and atraumatic. Eyes:  Sclera clear, no icterus.   Conjunctiva pink. Ears:  Normal auditory acuity. Neck:  Supple; no masses Lungs:  Clear throughout to auscultation.   No wheezes, crackles, or rhonchi. No acute distress. Heart:  Regular rate, irregular rhythm Abdomen:  Soft, nontender and nondistended. No masses, hepatosplenomegaly or hernias noted. Normal bowel sounds, without guarding, and without  rebound.   Rectal: deferred   Neurologic:  Alert and  oriented x4. Psych:  Alert and cooperative. Normal mood and affect.  Intake/Output from previous day: 11/07 0701 - 11/08 0700 In: 1244 [P.O.:120; I.V.:1124] Out: -  Intake/Output this shift: No intake/output data recorded.  Labs/Studies   Recent Labs Recent Labs    09/15/23 0551 09/16/23 0557  WBC 6.5 6.2  HGB 14.7 13.7  HCT 43.4 41.4  PLT 169 171   BMET Recent Labs    09/15/23 0613 09/16/23 0557  NA 124* 125*  K 5.1 4.3  CL 88* 94*  CO2 23 21*  GLUCOSE 103* 99  BUN 37* 32*  CREATININE 2.22* 1.48*  CALCIUM 8.2* 7.9*   LFT Recent Labs    09/15/23 0613  PROT  6.1*  ALBUMIN 3.4*  AST 92*  ALT 80*  ALKPHOS 110  BILITOT 1.4*   PT/INR No results for input(s): "LABPROT", "INR" in the last 72 hours. Hepatitis Panel No results for input(s): "HEPBSAG", "HCVAB", "HEPAIGM", "HEPBIGM" in the last 72 hours. C-Diff No results for input(s): "CDIFFTOX" in the last 72 hours.  Radiology/Studies CT ABDOMEN PELVIS WO CONTRAST  Result Date: 09/15/2023 CLINICAL DATA:  Bowel obstruction suspected. EXAM: CT ABDOMEN AND PELVIS WITHOUT CONTRAST TECHNIQUE: Multidetector CT imaging of the abdomen and pelvis was performed following the standard protocol without IV contrast. RADIATION DOSE REDUCTION: This exam was performed according to the departmental dose-optimization program which includes automated exposure control, adjustment of the mA and/or kV according to patient size and/or use of iterative reconstruction technique. COMPARISON:  CT scan abdomen and pelvis from 04/29/2021. FINDINGS: Lower chest: There are subsegmental atelectatic changes in the visualized lung bases. No overt consolidation. There are bilateral small pleural effusions. The heart is mildly enlarged in size. No pericardial effusion. Hepatobiliary: The liver is mildly enlarged in size measuring up to 18 cm in length, decreased since the prior study when it measured  up to 21 cm in length, when remeasured in similar fashion. There is subtle liver surface irregularity/nodularity, raising the concern for liver parenchymal disease such as cirrhosis. Correlate clinically and with liver function tests. No suspicious mass. No intrahepatic or extrahepatic bile duct dilation. Partially contracted gallbladder containing calcified gallstones/sludge without imaging signs of acute cholecystitis. No pericholecystic inflammatory fat stranding. Pancreas: Unremarkable. No pancreatic ductal dilatation or surrounding inflammatory changes. Spleen: Within normal limits. No focal lesion. Adrenals/Urinary Tract: Redemonstration of a 1.6 x 2.2 cm right adrenal adenoma. Unremarkable left adrenal gland. No suspicious renal mass. No hydronephrosis. No renal or ureteric calculi. Partially distended urinary bladder. There is asymmetric mild-to-moderate thickening of the right bladder wall without perivesical fat stranding, which is essentially similar to the prior study. No focal bladder mass or calculi. Stomach/Bowel: No disproportionate dilation of the small or large bowel loops. No evidence of abnormal bowel wall thickening or inflammatory changes. The appendix was not visualized; however there is no acute inflammatory process in the right lower quadrant. Vascular/Lymphatic: There is small amount of ascites mainly in the right paracolic gutter and in the dependent pelvis, grossly similar to the prior study. There is irregular hyperattenuating soft tissue along the right pelvic sidewall, which corresponds to cavity on the prior exam. Findings thus favor collapsed cavity wall. No pneumoperitoneum. No abdominal or pelvic lymphadenopathy, by size criteria. No aneurysmal dilation of the major abdominal arteries. There are moderate peripheral atherosclerotic vascular calcifications of the aorta and its major branches. Reproductive: The uterus is surgically absent. No large adnexal mass. Other: There is a tiny  fat containing umbilical hernia. There are bilateral tiny fat containing inguinal hernias. There is mild anasarca. Musculoskeletal: No suspicious osseous lesions. There is diffuse osteopenia of the visualized osseous structures. There are mild multilevel degenerative changes in the visualized spine. Since the prior study, there are new multiple compression deformities. There is moderate-to-severe compression deformity of T12 and L1 vertebrae. There is mild compression deformities of T11, L2, L3, L4 and L5 vertebra. There is mild retropulsion mainly at L1 vertebral level causing up to mild spinal canal narrowing. Right proximal femur intramedullary nail again seen. Subacute/healing fracture of left inferior pubic ramus and left anterior acetabulum noted, new since the prior study. Old healed fractures of left superior pubic ramus/pubic symphysis and right inferior pubic ramus again seen. IMPRESSION: *No  acute inflammatory process identified within the abdomen or pelvis. No bowel obstruction. *Since the prior study from June 2022, there are new compression deformities of T11 through L5 vertebrae, as described above including mild retropulsion at L1 vertebral body level causing mild spinal canal narrowing. Consider nonemergent MRI lumbar spine for better evaluation, if clinically indicated. *Multiple subacute/healing and old pelvic fractures, as described above. *There are multiple other nonacute observations (such as bilateral pleural effusions, mild cardiomegaly, mild hepatomegaly - decreased since the prior study, liver surface irregularity/nodularity - concerning for liver parenchymal disease, cholelithiasis without acute cholecystitis, right adrenal adenoma, asymmetric right-sided urinary bladder wall thickening, small ascites, etc., as described above. Electronically Signed   By: Jules Schick M.D.   On: 09/15/2023 11:36   DG Abdomen Acute W/Chest  Result Date: 09/15/2023 CLINICAL DATA:  82 year old female  with vomiting, tachycardia. EXAM: DG ABDOMEN ACUTE WITH 1 VIEW CHEST COMPARISON:  Portable chest 08/07/2023 and earlier. FINDINGS: Portable AP upright view of the chest at 0627 hours. Small but increased since September bilateral pleural effusions. Stable cardiac size and mediastinal contours. Visualized tracheal air column is within normal limits. No superimposed pneumothorax, pulmonary edema, air bronchograms. Portable upright and supine AP views of the abdomen at 0629 hours. No pneumoperitoneum. Pronounced Calcified aortic atherosclerosis. Non obstructed bowel gas pattern. Paucity of bowel gas in the upper abdomen. Right femur ORIF. Chronic bilateral pubic rami fractures. Underlying osteopenia. No definite acute osseous abnormality. IMPRESSION: 1. Small but increased pleural effusions since September, larger on the left. 2. Non obstructed bowel gas pattern. No pneumoperitoneum. 3. Osteopenia with right femur ORIF, chronic pubic rami fractures. Electronically Signed   By: Odessa Fleming M.D.   On: 09/15/2023 06:39     Assessment   Norma Barajas is a 82 y.o. year old female with history of chronic diastolic heart failure, paroxysmal A-fib, esophageal webs s/p multiple prior dilations, HTN, HLD, and anxiety/depression who presented to the ED with lower back pain and some abdominal pain and noted to be in A-fib with RVR.  Family had also mentioned patient with decreased oral intake secondary to foods tasting bad and some dysphagia.  Dysphagia, odynophagia, globus sensation: History of esophageal webs in the past s/p dilations.  Symptoms present for the last 3 months.  She is able to tolerate p.o. intake in regards to swallowing.  Speech eval performed today and has recommended dysphagia 2 diet with family chopped meats and thin liquids, not really an oropharyngeal component to her dysphagia.  Her history of esophageal webs likely is not the cause of her decreased appetite secondary to the taste of foods however we  could consider an upper endoscopy with dilation in the near future but likely will be outpatient unless she exhibits inability to tolerate any p.o. intake while inpatient.  Plan / Recommendations   Dysphagia 2 diet per speech recommendations Likely outpatient upper endoscopy with dilation in the near future Continue PPI daily     09/16/2023, 10:56 AM  Brooke Bonito, MSN, FNP-BC, AGACNP-BC [facility]

## 2023-09-16 NOTE — TOC Progression Note (Signed)
Transition of Care [facility]) - Progression Note    Patient Details  Name: Norma Barajas MRN: [id number] Date of Birth: Jul 14, 1941  [facility]) CM/SW Contact  Geni Bers, RN Phone Number: 09/16/2023, 1:26 PM  Clinical Narrative:     Chart reviewed. Pt is from [facility].     [name]  Expected Discharge Plan and Services       Living arrangements for the past 2 months: Assisted Living Facility                                       Social Determinants of Health (SDOH) Interventions SDOH Screenings   Food Insecurity: No Food Insecurity (09/15/2023)  Housing: Low Risk  (09/15/2023)  Transportation Needs: No Transportation Needs (09/15/2023)  Utilities: Not At Risk (09/15/2023)  Depression (PHQ2-9): Low Risk  (05/27/2023)  Financial Resource Strain: Low Risk  (07/04/2023)  Physical Activity: Unknown (07/04/2023)  Social Connections: Moderately Isolated (07/04/2023)  Stress: Stress Concern Present (07/04/2023)  Tobacco Use: Low Risk  (09/15/2023)    Readmission Risk Interventions    03/26/2021   12:33 PM 03/25/2021    2:59 PM  Readmission Risk Prevention Plan  Medication Screening  Complete  Transportation Screening  Complete  PCP or Specialist Appt within 5-7 Days Complete   Home Care Screening Complete   Medication Review (RN CM) Complete

## 2023-09-16 NOTE — TOC Progression Note (Signed)
Transition of Care [facility]) - Progression Note    Patient Details  Name: Norma Barajas MRN: [id number] Date of Birth: 12/27/1940  Transition [facility] Care [facility]) CM/SW Contact  Geni Bers, RN Phone Number: 09/16/2023, 2:11 PM  Clinical Narrative:    Pt is active with [facility]/OT at ALF.      [name]  Expected Discharge Plan and Services       Living arrangements for the past 2 months: Assisted Living Facility                                       Social Determinants of Health (SDOH) Interventions SDOH Screenings   Food Insecurity: No Food Insecurity (09/15/2023)  Housing: Low Risk  (09/15/2023)  Transportation Needs: No Transportation Needs (09/15/2023)  Utilities: Not At Risk (09/15/2023)  Depression (PHQ2-9): Low Risk  (05/27/2023)  Financial Resource Strain: Low Risk  (07/04/2023)  Physical Activity: Unknown (07/04/2023)  Social Connections: Moderately Isolated (07/04/2023)  Stress: Stress Concern Present (07/04/2023)  Tobacco Use: Low Risk  (09/15/2023)    Readmission Risk Interventions    03/26/2021   12:33 PM 03/25/2021    2:59 PM  Readmission Risk Prevention Plan  Medication Screening  Complete  Transportation Screening  Complete  PCP or Specialist Appt within 5-7 Days Complete   Home Care Screening Complete   Medication Review (RN CM) Complete

## 2023-09-16 NOTE — Progress Notes (Signed)
Rounding Note    Patient Name: Norma Barajas Date of Encounter: 09/16/2023  [facility] Cardiologist: Rollene Rotunda, MD   Subjective   Agitated this morning.   Inpatient Medications    Scheduled Meds:  aspirin  81 mg Per Tube BID   Chlorhexidine Gluconate Cloth  6 each Topical Q0600   heparin  5,000 Units Subcutaneous Q8H   lidocaine  1 patch Transdermal Q24H   metoprolol succinate  100 mg Oral BID   midodrine  2.5 mg Oral TID WC   pantoprazole  40 mg Oral Daily   simvastatin  20 mg Per Tube q1800   sodium chloride flush  3 mL Intravenous Q12H   traZODone  50 mg Oral QHS   Continuous Infusions:  sodium chloride     diltiazem (CARDIZEM) infusion 5 mg/hr (09/16/23 0131)   PRN Meds: sodium chloride, acetaminophen **OR** acetaminophen, bisacodyl, HYDROmorphone (DILAUDID) injection, methocarbamol (ROBAXIN) injection, ondansetron **OR** ondansetron (ZOFRAN) IV, polyethylene glycol, sodium chloride flush   Vital Signs    Vitals:   09/16/23 0700 09/16/23 0752 09/16/23 0800 09/16/23 0805  BP: 116/71   (!) 112/91  Pulse: 85  (!) 104 (!) 128  Resp: 14  19 (!) 23  Temp:  (!) 97.3 F (36.3 C)    TempSrc:  Axillary    SpO2: 93%  95% 92%  Weight:      Height:        Intake/Output Summary (Last 24 hours) at 09/16/2023 0846 Last data filed at 09/16/2023 0600 Gross per 24 hour  Intake 1243.99 ml  Output --  Net 1243.99 ml      09/15/2023    4:14 PM 09/15/2023    5:33 AM 09/06/2023   10:48 AM  Last 3 Weights  Weight (lbs) 116 lb 2.9 oz 119 lb 6.4 oz --  Weight (kg) 52.7 kg 54.159 kg --      Telemetry    Afib elevated rates - Personally Reviewed  ECG    na - Personally Reviewed  Physical Exam   GEN: No acute distress.   Neck: No JVD Cardiac: irreg Respiratory: Clear to auscultation bilaterally. GI: Soft, nontender, non-distended  MS: No edema; No deformity. Neuro:  Nonfocal  Psych: agitated  Labs    High Sensitivity Troponin:  No results for  input(s): "TROPONINIHS" in the last 720 hours.   Chemistry Recent Labs  Lab 09/09/23 1645 09/15/23 0613 09/16/23 0557  NA 130* 124* 125*  K 4.4 5.1 4.3  CL 92* 88* 94*  CO2 27 23 21*  GLUCOSE 127* 103* 99  BUN 24* 37* 32*  CREATININE 1.45* 2.22* 1.48*  CALCIUM 8.8* 8.2* 7.9*  MG  --  2.4  --   PROT 6.7 6.1*  --   ALBUMIN 3.7 3.4*  --   AST 22 92*  --   ALT 16 80*  --   ALKPHOS 122 110  --   BILITOT 0.6 1.4*  --   GFRNONAA 36* 21* 35*  ANIONGAP 11 13 10     Lipids No results for input(s): "CHOL", "TRIG", "HDL", "LABVLDL", "LDLCALC", "CHOLHDL" in the last 168 hours.  Hematology Recent Labs  Lab 09/09/23 1645 09/15/23 0551 09/16/23 0557  WBC 5.7 6.5 6.2  RBC 4.30 4.55 4.35  HGB 13.5 14.7 13.7  HCT 40.4 43.4 41.4  MCV 94.0 95.4 95.2  MCH 31.4 32.3 31.5  MCHC 33.4 33.9 33.1  RDW 15.7* 17.5* 18.1*  PLT 145* 169 171   Thyroid  Recent Labs  Lab 09/15/23 0613  TSH 3.018    BNPNo results for input(s): "BNP", "PROBNP" in the last 168 hours.  DDimer No results for input(s): "DDIMER" in the last 168 hours.   Radiology    CT ABDOMEN PELVIS WO CONTRAST  Result Date: 09/15/2023 CLINICAL DATA:  Bowel obstruction suspected. EXAM: CT ABDOMEN AND PELVIS WITHOUT CONTRAST TECHNIQUE: Multidetector CT imaging of the abdomen and pelvis was performed following the standard protocol without IV contrast. RADIATION DOSE REDUCTION: This exam was performed according to the departmental dose-optimization program which includes automated exposure control, adjustment of the mA and/or kV according to patient size and/or use of iterative reconstruction technique. COMPARISON:  CT scan abdomen and pelvis from 04/29/2021. FINDINGS: Lower chest: There are subsegmental atelectatic changes in the visualized lung bases. No overt consolidation. There are bilateral small pleural effusions. The heart is mildly enlarged in size. No pericardial effusion. Hepatobiliary: The liver is mildly enlarged in size  measuring up to 18 cm in length, decreased since the prior study when it measured up to 21 cm in length, when remeasured in similar fashion. There is subtle liver surface irregularity/nodularity, raising the concern for liver parenchymal disease such as cirrhosis. Correlate clinically and with liver function tests. No suspicious mass. No intrahepatic or extrahepatic bile duct dilation. Partially contracted gallbladder containing calcified gallstones/sludge without imaging signs of acute cholecystitis. No pericholecystic inflammatory fat stranding. Pancreas: Unremarkable. No pancreatic ductal dilatation or surrounding inflammatory changes. Spleen: Within normal limits. No focal lesion. Adrenals/Urinary Tract: Redemonstration of a 1.6 x 2.2 cm right adrenal adenoma. Unremarkable left adrenal gland. No suspicious renal mass. No hydronephrosis. No renal or ureteric calculi. Partially distended urinary bladder. There is asymmetric mild-to-moderate thickening of the right bladder wall without perivesical fat stranding, which is essentially similar to the prior study. No focal bladder mass or calculi. Stomach/Bowel: No disproportionate dilation of the small or large bowel loops. No evidence of abnormal bowel wall thickening or inflammatory changes. The appendix was not visualized; however there is no acute inflammatory process in the right lower quadrant. Vascular/Lymphatic: There is small amount of ascites mainly in the right paracolic gutter and in the dependent pelvis, grossly similar to the prior study. There is irregular hyperattenuating soft tissue along the right pelvic sidewall, which corresponds to cavity on the prior exam. Findings thus favor collapsed cavity wall. No pneumoperitoneum. No abdominal or pelvic lymphadenopathy, by size criteria. No aneurysmal dilation of the major abdominal arteries. There are moderate peripheral atherosclerotic vascular calcifications of the aorta and its major branches.  Reproductive: The uterus is surgically absent. No large adnexal mass. Other: There is a tiny fat containing umbilical hernia. There are bilateral tiny fat containing inguinal hernias. There is mild anasarca. Musculoskeletal: No suspicious osseous lesions. There is diffuse osteopenia of the visualized osseous structures. There are mild multilevel degenerative changes in the visualized spine. Since the prior study, there are new multiple compression deformities. There is moderate-to-severe compression deformity of T12 and L1 vertebrae. There is mild compression deformities of T11, L2, L3, L4 and L5 vertebra. There is mild retropulsion mainly at L1 vertebral level causing up to mild spinal canal narrowing. Right proximal femur intramedullary nail again seen. Subacute/healing fracture of left inferior pubic ramus and left anterior acetabulum noted, new since the prior study. Old healed fractures of left superior pubic ramus/pubic symphysis and right inferior pubic ramus again seen. IMPRESSION: *No acute inflammatory process identified within the abdomen or pelvis. No bowel obstruction. *Since the prior study from June 2022, there  are new compression deformities of T11 through L5 vertebrae, as described above including mild retropulsion at L1 vertebral body level causing mild spinal canal narrowing. Consider nonemergent MRI lumbar spine for better evaluation, if clinically indicated. *Multiple subacute/healing and old pelvic fractures, as described above. *There are multiple other nonacute observations (such as bilateral pleural effusions, mild cardiomegaly, mild hepatomegaly - decreased since the prior study, liver surface irregularity/nodularity - concerning for liver parenchymal disease, cholelithiasis without acute cholecystitis, right adrenal adenoma, asymmetric right-sided urinary bladder wall thickening, small ascites, etc., as described above. Electronically Signed   By: Jules Schick M.D.   On: 09/15/2023 11:36    DG Abdomen Acute W/Chest  Result Date: 09/15/2023 CLINICAL DATA:  83 year old female with vomiting, tachycardia. EXAM: DG ABDOMEN ACUTE WITH 1 VIEW CHEST COMPARISON:  Portable chest 08/07/2023 and earlier. FINDINGS: Portable AP upright view of the chest at 0627 hours. Small but increased since September bilateral pleural effusions. Stable cardiac size and mediastinal contours. Visualized tracheal air column is within normal limits. No superimposed pneumothorax, pulmonary edema, air bronchograms. Portable upright and supine AP views of the abdomen at 0629 hours. No pneumoperitoneum. Pronounced Calcified aortic atherosclerosis. Non obstructed bowel gas pattern. Paucity of bowel gas in the upper abdomen. Right femur ORIF. Chronic bilateral pubic rami fractures. Underlying osteopenia. No definite acute osseous abnormality. IMPRESSION: 1. Small but increased pleural effusions since September, larger on the left. 2. Non obstructed bowel gas pattern. No pneumoperitoneum. 3. Osteopenia with right femur ORIF, chronic pubic rami fractures. Electronically Signed   By: Odessa Fleming M.D.   On: 09/15/2023 06:39    Cardiac Studies     Patient Profile     BRIYAH EDMINSTER is a 82 y.o. female with a hx of permanent afib  who is being seen 09/15/2023 for the evaluation of afib with RVR at the request of Dr Gwenlyn Perking.   Assessment & Plan    1.Permanent afib - long history of afib as documented above - failed antiarrhythmic therapy,has just been rate controlled - off anticoag due to falls - rates likely exacerbated in setting of GI symptoms with N/V, hypovolemia - started on dilt gtt, given IV lorpessor 5mg  x 2 in ER. Received dose of toprol oral 150mg  - from chart on torpo. 150mg  bid at home, high dose though dosing references do suggest can go up to 400mg  daily. Would favor lower toprol dose with added on scheduled diltiazem if needed. - rates should partly improve as systemic illness resolves, rehydrated.     On toprol  100mg  bid, dilt gtt at 5. Rates remain elevated. STarted on midodrine to help support bp's on av nodal agents. Add diltiazem 30mg  every 6 hours see if can wean drip. - agitated/delerium this morning with rise in HRs, not taking PO currently due to agitation. HOpefully will improve, continue dilt gtt until able to wean.      2.N/V/abdominal pain - per primary team       3. Hyponatremia - likely hypovolemic, IVFs per primary team. Admit Cr 2.2, GFR 21. Baseline 1 month ago Cr 0.86, GFR >60 - improving with IVFs     4. AKI - likely prerenal, IVFs per primary team. - improving with IVFs   5.Pullmonary HTN/RV dysfunction - followed by Dr Antoine Poche as outpatient - hypovolemic on presentation, close monitoring by rehydrating.  - JVD but likely secondary to her severe TR, she appears hypovolemic in general     For questions or updates, please contact [facility] Please  consult [email or web address] for contact info under        Signed, Dina Rich, MD  09/16/2023, 8:46 AM

## 2023-09-16 NOTE — Plan of Care (Signed)

## 2023-09-16 NOTE — Progress Notes (Signed)
*  PRELIMINARY RESULTS* Echocardiogram 2D Echocardiogram has been performed.  Stacey Drain 09/16/2023, 2:21 PM

## 2023-09-17 DIAGNOSIS — R1310 Dysphagia, unspecified: Secondary | ICD-10-CM | POA: Diagnosis not present

## 2023-09-17 DIAGNOSIS — K219 Gastro-esophageal reflux disease without esophagitis: Secondary | ICD-10-CM | POA: Diagnosis not present

## 2023-09-17 DIAGNOSIS — I5082 Biventricular heart failure: Secondary | ICD-10-CM | POA: Diagnosis not present

## 2023-09-17 DIAGNOSIS — L89151 Pressure ulcer of sacral region, stage 1: Secondary | ICD-10-CM | POA: Diagnosis not present

## 2023-09-17 LAB — COMPREHENSIVE METABOLIC PANEL
ALT: 70 U/L — ABNORMAL HIGH (ref 0–44)
AST: 55 U/L — ABNORMAL HIGH (ref 15–41)
Albumin: 3 g/dL — ABNORMAL LOW (ref 3.5–5.0)
Alkaline Phosphatase: 89 U/L (ref 38–126)
Anion gap: 9 (ref 5–15)
BUN: 26 mg/dL — ABNORMAL HIGH (ref 8–23)
CO2: 21 mmol/L — ABNORMAL LOW (ref 22–32)
Calcium: 7.8 mg/dL — ABNORMAL LOW (ref 8.9–10.3)
Chloride: 98 mmol/L (ref 98–111)
Creatinine, Ser: 1.22 mg/dL — ABNORMAL HIGH (ref 0.44–1.00)
GFR, Estimated: 44 mL/min — ABNORMAL LOW (ref >60)
Glucose, Bld: 94 mg/dL (ref 70–99)
Potassium: 3.9 mmol/L (ref 3.5–5.1)
Sodium: 128 mmol/L — ABNORMAL LOW (ref 135–145)
Total Bilirubin: 1.4 mg/dL — ABNORMAL HIGH (ref <1.2)
Total Protein: 5.3 g/dL — ABNORMAL LOW (ref 6.5–8.1)

## 2023-09-17 LAB — LACTIC ACID, PLASMA: Lactic Acid, Venous: 1.1 mmol/L (ref 0.5–1.9)

## 2023-09-17 MED ORDER — DILTIAZEM HCL 60 MG PO TABS
60.0000 mg | ORAL_TABLET | Freq: Four times a day (QID) | ORAL | Status: DC
  Administered 2023-09-17 – 2023-09-18 (×5): 60 mg via ORAL
  Filled 2023-09-17 (×5): qty 1

## 2023-09-17 NOTE — Progress Notes (Signed)
Progress Note   Patient: Norma Barajas QIO:[id number] DOB: Dec 05, 1940 DOA: 09/15/2023     2 DOS: the patient was seen and examined on 09/17/2023   Brief hospital admission course: KALAYNA DARPINO is a 82 y.o. female with medical history significant of chronic diastolic heart failure, paroxysmal atrial fibrillation, Esophageal from disease, hypertension, hyperlipidemia and depression/anxiety; who presented to the hospital secondary to not feeling well and noted to be in A-fib with RVR.  Per family in the room patient has been experiencing over the last 4 weeks decreased oral intake with concerns for not liking the food, bad taste and at times difficulty swallowing.  There has not been any pain in her chest but complaining of lower back pain after recent mechanical fall with pelvic bone fracture (no surgery intervention required).  While talking with patient she expressed not feeling well and reports intermittent nausea and midepigastric discomfort.  Patient was found to be in A-fib with RVR and blood work demonstrated acute kidney injury on chronic kidney disease and hyponatremia.   Gentle fluid resuscitation initiated and patient started on Cardizem drip to help with stabilizing her heart rate.  TRH consulted to place patient in the hospital for further evaluation and management.    Assessment and Plan: 1-A-fib with RVR -Patient with history of paroxysmal atrial fibrillation who presented with A-fib with RVR. -Continue oral Cardizem and adjusted dose of metoprolol as per cardiology recommendation. -Blood pressure soft but stable.  Continue monitoring. -Continue close monitoring of patient vital sign -Continue adequate hydration and close monitoring of her heart rate.  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 -Avoid hypotension and the use of contrast. -Creatinine currently 1.22 which is close to patient's baseline.  3-chronic diastolic heart failure/right ventricular failure and pulmonary hypertension -Continue patient follow-up with cardiology service (Dr. Antoine Poche) -continue Holding diuretics at the moment in the setting of acute dehydration -Continue to follow daily weights, strict I's and O's and low-sodium diet.  4-hypertension -Overall stable but soft blood pressure while receiving Cardizem drip requiring midodrine. -Patient is currently transitioned off cardizem drip. -will follow vital signs while using oral cardizem and metoprolol -continue holding diuretics.  5-gastroesophageal reflux disease/dysphagia -Continue PPI -Appreciate assistance and evaluation by speech therapy with recommendation for dysphagia 2 diet and thin liquids. -GI service consulted and has recommended outpatient follow-up for potential endoscopic evaluation and dilatation. -No oropharyngeal abnormalities appreciated by speech therapy assessment.  6-pressure injury of the skin -Stage I coccyx pressure present at time of admission -No signs of superimposed infection -Continue constant repositioning and preventive measure.  7-hyperlipidemia -Continue statin.  8-transaminitis/lactic acidosis -CT abdomen and pelvis not demonstrating acute abnormality -Overall LFTs continue adequately improving after fluid resuscitation -Lactic acid completely resolved.   Subjective:  Currently off Cardizem drip and with good control of heart rate while using oral Cardizem and and oral metoprolol.  No chest pain, no nausea, no vomiting.  Tolerating dysphagia 2 diet.  Physical Exam: Vitals:   09/17/23 1400 09/17/23 1430 09/17/23 1500 09/17/23 1530  BP: (!) 93/58 (!) 103/47 105/66 113/81  Pulse: 83 76 76 70  Resp: 20 14 14 19   Temp:    97.8 F (36.6 C)  TempSrc:    Axillary  SpO2: 93% 95% 94% 95%  Weight:       Height:       General exam:  Alert, awake, afebrile, no chest pain, no nausea, no vomiting.  Complaining of lower back pain. Respiratory system: Clear to auscultation. Respiratory effort normal.  Good saturation on room air. Cardiovascular system: Rate controlled, no rubs, no gallops. Gastrointestinal system: Abdomen is nondistended, soft and nontender. No organomegaly or masses felt. Normal bowel sounds heard. Central nervous system: No focal neurological deficits. Extremities: No cyanosis, clubbing or edema. Skin: No petechiae; stage I coccyx pressure injury present at time of admission without signs of superimposed infection. Psychiatry: Mood & affect appropriate on today's examination..   Data Reviewed: Comprehensive metabolic panel: Sodium 128, potassium 3.9, chloride 98, bicarb 21, glucose 94, BUN 26, creatinine 1.22, AST 55, ALT 70 and GFR 44.   CBC: White blood cell 6.2, hemoglobin 13.7 and platelet count 171K Lactic acid: 1.1  Family Communication: Niece at bedside.  Disposition: Status is: Inpatient Remains inpatient appropriate because: Continue IV therapy.  Planned Discharge Destination: Assisted living facility.   Author: Vassie Loll, MD 09/17/2023 6:05 PM  For on call review [email or web address].

## 2023-09-18 DIAGNOSIS — L89151 Pressure ulcer of sacral region, stage 1: Secondary | ICD-10-CM | POA: Diagnosis not present

## 2023-09-18 DIAGNOSIS — K219 Gastro-esophageal reflux disease without esophagitis: Secondary | ICD-10-CM | POA: Diagnosis not present

## 2023-09-18 DIAGNOSIS — R1310 Dysphagia, unspecified: Secondary | ICD-10-CM | POA: Diagnosis not present

## 2023-09-18 DIAGNOSIS — I5082 Biventricular heart failure: Secondary | ICD-10-CM | POA: Diagnosis not present

## 2023-09-18 MED ORDER — TORSEMIDE 20 MG PO TABS: 20.0000 mg | ORAL_TABLET | Freq: Every day | ORAL | Status: DC

## 2023-09-18 MED ORDER — DILTIAZEM HCL ER COATED BEADS 240 MG PO CP24: 240.0000 mg | ORAL_CAPSULE | Freq: Every day | ORAL | 3 refills | Status: DC

## 2023-09-18 MED ORDER — METHOCARBAMOL 500 MG PO TABS: 500.0000 mg | ORAL_TABLET | Freq: Three times a day (TID) | ORAL | 0 refills | Status: DC | PRN

## 2023-09-18 MED ORDER — MIDODRINE HCL 2.5 MG PO TABS: 2.5000 mg | ORAL_TABLET | Freq: Two times a day (BID) | ORAL | 1 refills | Status: DC

## 2023-09-18 MED ORDER — METOPROLOL SUCCINATE ER 100 MG PO TB24: 100.0000 mg | ORAL_TABLET | Freq: Two times a day (BID) | ORAL | 2 refills | Status: DC

## 2023-09-18 NOTE — TOC Transition Note (Signed)
Transition of Care [facility]) - CM/SW Discharge Note   Patient Details  Name: Norma Barajas MRN: [id number] Date of Birth: 1941/03/20  [facility] CM/SW Contact:  Catalina Gravel, LCSW Phone Number: 09/18/2023, 5:32 PM   Clinical Narrative:     CSW made multiple attempts to reach facility- no answer. Left message for Olegario Messier with no success. [facility] nursing staff was able to get a response  to a call to facility from [facility]- agreeing pt could return.  CSW concerned about  not hearing from facility or director and assisting with transport.  FL2 and DS sent earlier.   Niece, Eber Jones   agreed to drive to facility and facilitated them calling CSW.  Marisue Ivan from NP clarified if pt using a catheter. CSW consulted with nursing staff who said external that will be removed at DC. Marisue Ivan agreeable pt returning and made aware  2 person assist upon arrival this evening.    Eber Jones has a transfer chair- and called CSW back- niece has transported Pt at DC in past and feels comfortable doing today. Niece asked that I not call for transport and she will call charge nurse when arrives to DC.  [place] to RN staff advising niece Eber Jones will transport Pt from hospital to facility and will arrive here in about 45 mins.  No further TOC needs at this time.     [name]   Patient Goals and CMS Choice   Choice offered to / list presented to : Patient  Discharge Placement                         Discharge Plan and Services Additional resources added to the After Visit Summary for                                       Social Determinants of Health (SDOH) Interventions SDOH Screenings   Food Insecurity: No Food Insecurity (09/15/2023)  Housing: Low Risk  (09/15/2023)  Transportation Needs: No Transportation Needs (09/15/2023)  Utilities: Not At Risk (09/15/2023)  Depression (PHQ2-9): Low Risk  (05/27/2023)  Financial Resource Strain: Low Risk  (07/04/2023)  Physical  Activity: Unknown (07/04/2023)  Social Connections: Moderately Isolated (07/04/2023)  Stress: Stress Concern Present (07/04/2023)  Tobacco Use: Low Risk  (09/15/2023)     Readmission Risk Interventions    03/26/2021   12:33 PM 03/25/2021    2:59 PM  Readmission Risk Prevention Plan  Medication Screening  Complete  Transportation Screening  Complete  PCP or Specialist Appt within 5-7 Days Complete   Home Care Screening Complete   Medication Review (RN CM) Complete

## 2023-09-18 NOTE — Plan of Care (Signed)
  Problem: Coping: Goal: Level of anxiety will decrease Outcome: Progressing   Problem: Pain Management: Goal: General experience of comfort will improve Outcome: Progressing   Problem: Safety: Goal: Ability to remain free from injury will improve Outcome: Progressing

## 2023-09-18 NOTE — Progress Notes (Signed)
Patients niece Rayfield Citizen called and asked about PT eval for patient , patient per Dr. Gwenlyn Perking is functioning at her baseline, patient was moved from bed to chair requiring a 2 person assist. She does have w/c at facility . Paperwork has been sent to [employer]. Charge RN has spoke with Engineer, civil (consulting), she also called niece and left a message.

## 2023-09-18 NOTE — [place] FL2 (Addendum)
[place] MEDICAID FL2 LEVEL OF CARE FORM     IDENTIFICATION  Patient Name: Norma Barajas Birthdate: 09-09-1941 Sex: female Admission Date (Current Location): 09/15/2023  [facility] and [place] Number:  [employer] and Address:  [facility],  [address] [place] [address]      Provider Number:    Attending Physician Name and Address:  Vassie Loll, MD  Relative Name and Phone Number:  Ernest Mallick (Niece)  [phone] (Mobile)    Current Level of Care: Hospital Recommended Level of Care: Assisted Living Facility Prior Approval Number:    Date Approved/Denied:   PASRR Number:    Discharge Plan: Other (Comment) (ALF)    Current Diagnoses: Patient Active Problem List   Diagnosis Date Noted   Pressure injury of skin 09/15/2023   Acute kidney injury superimposed on chronic kidney disease (HCC) 09/15/2023   Vitamin B12 deficiency 09/01/2023   Pelvic fracture (HCC) 08/05/2023   Vertigo 07/01/2023   Afib (HCC) 04/27/2023   Atrial fibrillation with RVR (HCC) 04/16/2023   Stage 3a chronic kidney disease (HCC) 11/03/2022   Iron deficiency 11/03/2022   Nonspecific abnormal electrocardiogram (ECG) (EKG) 08/29/2022   Abnormal SPEP 02/07/2022   Degenerative disc disease, lumbar 12/20/2021   Hypertensive heart disease with chronic diastolic congestive heart failure (HCC) 03/27/2021   Gallbladder sludge 03/27/2021   Aortic atherosclerosis (HCC) 03/27/2021   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) 10/23/2020   Severe tricuspid valve regurgitation 10/23/2020   Hypokalemia 09/16/2020   Early satiety 07/15/2020   Difficulty sleeping 06/22/2019   Paroxysmal atrial fibrillation (HCC) 08/21/2015   Lumbar pain 08/07/2015   Vitamin D deficiency 11/27/2014   Drug induced constipation 12/14/2013   HTN (hypertension) 07/24/2013   Coronary atherosclerosis of native coronary artery 07/24/2013   Dysphagia  09/28/2012   Pulmonary nodule 02/18/2011   Hyperlipidemia 07/29/2008   GAD (generalized anxiety disorder) 07/29/2008   Primary hypertension 07/29/2008   GERD 07/29/2008   Osteoporosis 07/29/2008    Orientation RESPIRATION BLADDER Height & Weight     Self, Situation  Normal External catheter Weight: 116 lb 2.9 oz (52.7 kg) Height:  4\' 8"  (142.2 cm)  BEHAVIORAL SYMPTOMS/MOOD NEUROLOGICAL BOWEL NUTRITION STATUS      Continent Diet  AMBULATORY STATUS COMMUNICATION OF NEEDS Skin   Extensive Assist Verbally Normal                       Personal Care Assistance Level of Assistance  Bathing, Feeding, Dressing Bathing Assistance: Limited assistance Feeding assistance: Limited assistance Dressing Assistance: Limited assistance     Functional Limitations Info  Sight Sight Info: Impaired        SPECIAL CARE FACTORS FREQUENCY                       Contractures Contractures Info: Not present    Additional Factors Info  Code Status Code Status Info: [id number]             Current Medications (09/18/2023):  DISCHARGE MEDICATION: Allergies as of 09/18/2023         Reactions    Codeine Nausea And Vomiting    Doxycycline Other (See Comments)    Chest congestion    Triamterene-hctz Other (See Comments)    weakness    Atorvastatin Other (See Comments)    Myalgias    Crestor [rosuvastatin Calcium] Other (See Comments)    weakness    Morphine Nausea Only  Risedronate Sodium Other (See Comments)    ACTONEL - reflux            Medication List       TAKE these medications     Acetaminophen Extra Strength 500 MG Tabs TAKE 1 TABLET EVERY 6 HOURS AS NEEDED    Aspirin Low Dose 81 MG tablet Generic drug: aspirin EC TAKE 1 TABLET TWICE DAILY    bisacodyl 10 MG suppository Commonly known as: Dulcolax Place 1 suppository (10 mg total) rectally as needed for moderate constipation.    cholecalciferol 25 MCG (1000 UNIT) tablet Commonly known as: VITAMIN  D3 Take 1,000 Units by mouth in the morning.    cyanocobalamin 1000 MCG tablet Commonly known as: VITAMIN B12 Take 1,000 mcg by mouth 4 (four) times a week.    diltiazem 240 MG 24 hr capsule Commonly known as: Cartia XT Take 1 capsule (240 mg total) by mouth daily.    lidocaine 4 % Commonly known as: HM Lidocaine Patch Place 1 patch onto the skin daily for 15 days.    methocarbamol 500 MG tablet Commonly known as: ROBAXIN Take 1 tablet (500 mg total) by mouth every 8 (eight) hours as needed for muscle spasms.    metoprolol succinate 100 MG 24 hr tablet Commonly known as: TOPROL-XL Take 1 tablet (100 mg total) by mouth 2 (two) times daily. Take with or immediately following a meal. What changed:  how much to take when to take this additional instructions    midodrine 2.5 MG tablet Commonly known as: PROAMATINE Take 1 tablet (2.5 mg total) by mouth 2 (two) times daily with a meal.    omeprazole 20 MG capsule Commonly known as: PRILOSEC TAKE 1 CAPSULE EVERY DAY    ondansetron 4 MG tablet Commonly known as: Zofran Take 1 tablet (4 mg total) by mouth every 8 (eight) hours as needed for nausea or vomiting.    polyethylene glycol 17 g packet Commonly known as: MIRALAX / GLYCOLAX Take 17 g by mouth 2 (two) times daily as needed for mild constipation.    potassium chloride 10 MEQ tablet Commonly known as: KLOR-CON Take 1 tablet (10 mEq total) by mouth daily. Take While taking Demadex/Torsemide    simvastatin 20 MG tablet Commonly known as: ZOCOR Take 1 tablet (20 mg total) by mouth daily. What changed: when to take this    torsemide 20 MG tablet Commonly known as: DEMADEX Take 1 tablet (20 mg total) by mouth daily. Breakfast and lunch What changed: when to take this    traZODone 50 MG tablet Commonly known as: DESYREL Take 1 tablet (50 mg total) by mouth at bedtime.              This is the current hospital active medication list Current Facility-Administered  Medications  Medication Dose Route Frequency Provider Last Rate Last Admin   acetaminophen (TYLENOL) tablet 650 mg  650 mg Oral Q6H PRN Vassie Loll, MD   650 mg at 09/18/23 0932   Or   acetaminophen (TYLENOL) suppository 650 mg  650 mg Rectal Q6H PRN Vassie Loll, MD       aspirin chewable tablet 81 mg  81 mg Oral BID Vassie Loll, MD   81 mg at 09/18/23 0981   bisacodyl (DULCOLAX) suppository 10 mg  10 mg Rectal Daily PRN Vassie Loll, MD       Chlorhexidine Gluconate Cloth 2 % PADS 6 each  6 each Topical Q0600 Vassie Loll, MD   6  each at 09/18/23 0530   diltiazem (CARDIZEM) tablet 60 mg  60 mg Oral QID Vassie Loll, MD   60 mg at 09/18/23 0923   heparin injection 5,000 Units  5,000 Units Subcutaneous Myriam Jacobson, MD   5,000 Units at 09/18/23 0529   HYDROmorphone (DILAUDID) injection 0.5 mg  0.5 mg Intravenous Q8H PRN Vassie Loll, MD   0.5 mg at 09/18/23 0206   lidocaine (LIDODERM) 5 % 1 patch  1 patch Transdermal Q24H Vassie Loll, MD   1 patch at 09/17/23 1910   methocarbamol (ROBAXIN) injection 500 mg  500 mg Intravenous Q8H PRN Vassie Loll, MD   500 mg at 09/15/23 1705   metoprolol succinate (TOPROL-XL) 24 hr tablet 100 mg  100 mg Oral BID Antoine Poche, MD   100 mg at 09/18/23 0922   midodrine (PROAMATINE) tablet 2.5 mg  2.5 mg Oral TID WC Antoine Poche, MD   2.5 mg at 09/18/23 0922   ondansetron (ZOFRAN) tablet 4 mg  4 mg Oral Q6H PRN Vassie Loll, MD       Or   ondansetron [facility]) injection 4 mg  4 mg Intravenous Q6H PRN Vassie Loll, MD       pantoprazole (PROTONIX) EC tablet 40 mg  40 mg Oral Daily Vassie Loll, MD   40 mg at 09/18/23 8119   polyethylene glycol (MIRALAX / GLYCOLAX) packet 17 g  17 g Oral BID PRN Vassie Loll, MD       simvastatin (ZOCOR) tablet 20 mg  20 mg Oral q1800 Vassie Loll, MD   20 mg at 09/17/23 1816   sodium chloride flush (NS) 0.9 % injection 3 mL  3 mL Intravenous Q12H Vassie Loll, MD   3 mL at 09/18/23  0954   sodium chloride flush (NS) 0.9 % injection 3 mL  3 mL Intravenous PRN Vassie Loll, MD       traZODone (DESYREL) tablet 50 mg  50 mg Oral QHS Vassie Loll, MD   50 mg at 09/17/23 2143     Discharge Medications: Please see discharge summary for a list of discharge medications.  Relevant Imaging Results:  Relevant Lab Results:   Additional Information [id number]  Catalina Gravel, LCSW

## 2023-09-18 NOTE — Discharge Summary (Signed)
Physician Discharge Summary   Patient: Norma Barajas MRN: [id number] DOB: [birth year]  Admit date:     09/15/2023  Discharge date: 09/18/23  Discharge Physician: Vassie Loll   PCP: Sonny Masters, FNP   Recommendations at discharge:  Repeat base metabolic panel to follow ultralights renal function Reassess blood pressure and adjust medication as needed Make sure sure patient follow-up with gastroenterology and cardiology as recommended.  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 female with medical history significant of chronic diastolic heart failure, paroxysmal atrial fibrillation, Esophageal from disease, hypertension, hyperlipidemia and depression/anxiety; who presented to the hospital secondary to not feeling well and noted to be in A-fib with RVR.  Per family in the room patient has been experiencing over the last 4 weeks decreased oral intake with concerns for not liking the food, bad taste and at times difficulty swallowing.  There has not been any pain in her chest but complaining of lower back pain after recent mechanical fall with pelvic bone fracture (no surgery intervention required).  While talking with patient she expressed not feeling well and reports intermittent nausea and midepigastric discomfort.  Patient was found to be in A-fib with RVR and blood work demonstrated acute kidney injury on chronic kidney disease and hyponatremia.   Gentle fluid resuscitation initiated and patient started on Cardizem drip to help with stabilizing her heart rate.  TRH consulted to place patient in the hospital for further evaluation and management.  Assessment and  Plan: 1-A-fib with RVR -Patient with history of paroxysmal atrial fibrillation who presented with A-fib with RVR. -Continue oral Cardizem and adjusted dose of metoprolol as per cardiology recommendation. -Blood pressure soft but stable.  Continue monitoring. -Continue outpatient follow-up with cardiology service. -Continue adequate hydration and close monitoring of her heart rate.   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 -Avoid hypotension and the use of contrast. -Creatinine essentially back to patient's baseline at time of discharge -Safe to resume diuretics -Maintain adequate hydration.   3-chronic diastolic heart failure/right ventricular failure and pulmonary hypertension -Continue patient follow-up with cardiology service (Dr. Antoine Poche) -continue Holding diuretics at the moment in the setting of acute dehydration -Continue to follow daily weights, strict I's and O's and low-sodium diet.   4-hypertension -Overall stable, but soft blood pressure while receiving Cardizem drip requiring midodrine. -Patient is currently transitioned off cardizem drip. -will follow vital signs while using oral cardizem and metoprolol -Continue adjusted dose of midodrine and resumed daily Demadex.   5-gastroesophageal reflux disease/dysphagia -Continue PPI -Appreciate assistance and evaluation by speech therapy with recommendation for dysphagia 2 diet and thin liquids. -GI service consulted and has recommended outpatient follow-up for potential endoscopic evaluation and dilatation. -No oropharyngeal abnormalities appreciated by speech therapy assessment.   6-pressure injury of the skin -Stage I coccyx pressure present at time of admission -No signs of superimposed infection -Continue constant  repositioning and preventive measure.   7-hyperlipidemia -Continue statin.   8-transaminitis/lactic acidosis -CT abdomen and pelvis not demonstrating acute abnormality -Overall LFTs continue adequately  improving after fluid resuscitation -Lactic acid completely resolved. -Follow LFTs trend with repeat complete metabolic panel as an outpatient.  Consultants: Cardiology service; gastroenterology. Procedures performed: See below for x-ray reports. Disposition: Assisted living Diet recommendation: Heart healthy/low-sodium diet; dysphagia 2 diet.  DISCHARGE MEDICATION: Allergies as of 09/18/2023       Reactions   Codeine Nausea And Vomiting   Doxycycline Other (See Comments)   Chest congestion   Triamterene-hctz Other (See Comments)   weakness   Atorvastatin Other (See Comments)   Myalgias   Crestor [rosuvastatin Calcium] Other (See Comments)   weakness   Morphine Nausea Only   Risedronate Sodium Other (See Comments)   ACTONEL - reflux        Medication List     TAKE these medications    Acetaminophen Extra Strength 500 MG Tabs TAKE 1 TABLET EVERY 6 HOURS AS NEEDED   Aspirin Low Dose 81 MG tablet Generic drug: aspirin EC TAKE 1 TABLET TWICE DAILY   bisacodyl 10 MG suppository Commonly known as: Dulcolax Place 1 suppository (10 mg total) rectally as needed for moderate constipation.   cholecalciferol 25 MCG (1000 UNIT) tablet Commonly known as: VITAMIN D3 Take 1,000 Units by mouth in the morning.   cyanocobalamin 1000 MCG tablet Commonly known as: VITAMIN B12 Take 1,000 mcg by mouth 4 (four) times a week.   diltiazem 240 MG 24 hr capsule Commonly known as: Cartia XT Take 1 capsule (240 mg total) by mouth daily.   lidocaine 4 % Commonly known as: HM Lidocaine Patch Place 1 patch onto the skin daily for 15 days.   methocarbamol 500 MG tablet Commonly known as: ROBAXIN Take 1 tablet (500 mg total) by mouth every 8 (eight) hours as needed for muscle  spasms.   metoprolol succinate 100 MG 24 hr tablet Commonly known as: TOPROL-XL Take 1 tablet (100 mg total) by mouth 2 (two) times daily. Take with or immediately following a meal. What changed:  how much to take when to take this additional instructions   midodrine 2.5 MG tablet Commonly known as: PROAMATINE Take 1 tablet (2.5 mg total) by mouth 2 (two) times daily with a meal.   omeprazole 20 MG capsule Commonly known as: PRILOSEC TAKE 1 CAPSULE EVERY DAY   ondansetron 4 MG tablet Commonly known as: Zofran Take 1 tablet (4 mg total) by mouth every 8 (eight) hours as needed for nausea or vomiting.   polyethylene glycol 17 g packet Commonly known as: MIRALAX / GLYCOLAX Take 17 g by mouth 2 (two) times daily as needed for mild constipation.   potassium chloride 10 MEQ tablet Commonly known as: KLOR-CON Take 1 tablet (10 mEq total) by mouth daily. Take While taking Demadex/Torsemide   simvastatin 20 MG tablet Commonly known as: ZOCOR Take 1 tablet (20 mg total) by mouth daily. What changed: when to take this   torsemide 20 MG tablet Commonly known as: DEMADEX Take 1 tablet (20 mg total) by mouth daily. Breakfast and lunch What changed: when to take this   traZODone 50 MG tablet Commonly known as: DESYREL Take 1 tablet (50 mg total) by mouth at bedtime.               Discharge Care Instructions  (From admission, onward)           Start     Ordered   09/18/23 0000  Discharge wound care:       Comments: Keep area clean and dry; constant repositioning recommended.  09/18/23 0923            Discharge Exam: Filed Weights   09/15/23 0533 09/15/23 1614  Weight: 54.2 kg 52.7 kg   General exam: Alert, awake, afebrile, no chest pain, no nausea, no vomiting.  Complaining of lower back pain. Respiratory system: Clear to auscultation. Respiratory effort normal.  Good saturation on room air. Cardiovascular system: Rate controlled, no rubs, no  gallops. Gastrointestinal system: Abdomen is nondistended, soft and nontender. No organomegaly or masses felt. Normal bowel sounds heard. Central nervous system: No focal neurological deficits. Extremities: No cyanosis, clubbing or edema. Skin: No petechiae; stage I coccyx pressure injury present at time of admission without signs of superimposed infection. Psychiatry: Mood & affect appropriate on today's examination..   Condition at discharge: Stable and improved.  The results of significant diagnostics from this hospitalization (including imaging, microbiology, ancillary and laboratory) are listed below for reference.   Imaging Studies: ECHOCARDIOGRAM COMPLETE  Result Date: 09/16/2023    ECHOCARDIOGRAM REPORT   Patient Name:   VIRGIN GREISEN Date of Exam: 09/16/2023 Medical Rec #:  [id number]   Height:       56.0 in Accession #:    [id number]  Weight:       116.2 lb Date of Birth:  06-Jun-1941   BSA:          1.409 m Patient Age:    82 years    BP:           103/60 mmHg Patient Gender: F           HR:           80 bpm. [facility] Procedure: 2D Echo, Cardiac Doppler and Color Doppler Indications:    Atrial Fibrillation l48.91  History:        Patient has prior history of Echocardiogram examinations, most                 recent 04/17/2023. CHF, Arrythmias:Atrial Fibrillation; Risk                 Factors:Hypertension and Dyslipidemia.  Sonographer:    Celesta Gentile RCS Referring Phys: [id number] Shulamit Donofrio IMPRESSIONS  1. Left ventricular ejection fraction, by estimation, is 55 to 60%. The left ventricle has normal function. The left ventricle has no regional wall motion abnormalities. Left ventricular diastolic parameters are indeterminate.  2. Right ventricular systolic function is moderately reduced. The right ventricular size is severely enlarged. There is mildly elevated pulmonary artery systolic pressure.  3. Left atrial size was severely dilated.  4. Right atrial size was severely dilated.   5. The mitral valve is abnormal. Mild mitral valve regurgitation. No evidence of mitral stenosis.  6. The tricuspid valve is abnormal. Tricuspid valve regurgitation is severe.  7. The aortic valve is tricuspid. There is mild calcification of the aortic valve. There is mild thickening of the aortic valve. Aortic valve regurgitation is mild.  8. The inferior vena cava is dilated in size with <50% respiratory variability, suggesting right atrial pressure of 15 mmHg. FINDINGS  Left Ventricle: Left ventricular ejection fraction, by estimation, is 55 to 60%. The left ventricle has normal function. The left ventricle has no regional wall motion abnormalities. The left ventricular internal cavity size was normal in size. There is  no left ventricular hypertrophy. Left ventricular diastolic parameters are indeterminate. Right Ventricle: The right ventricular size is severely enlarged. Right vetricular wall thickness was not well visualized. Right ventricular systolic function  is moderately reduced. There is mildly elevated pulmonary artery systolic pressure. The tricuspid regurgitant velocity is 2.33 m/s, and with an assumed right atrial pressure of 15 mmHg, the estimated right ventricular systolic pressure is 36.7 mmHg. Left Atrium: Left atrial size was severely dilated. Right Atrium: Right atrial size was severely dilated. Pericardium: There is no evidence of pericardial effusion. Mitral Valve: The mitral valve is abnormal. Mild mitral valve regurgitation. No evidence of mitral valve stenosis. Tricuspid Valve: The tricuspid valve is abnormal. Tricuspid valve regurgitation is severe. No evidence of tricuspid stenosis. Aortic Valve: The aortic valve is tricuspid. There is mild calcification of the aortic valve. There is mild thickening of the aortic valve. There is mild aortic valve annular calcification. Aortic valve regurgitation is mild. Aortic regurgitation PHT measures 521 msec. Aortic valve mean gradient measures 3.1  mmHg. Aortic valve peak gradient measures 5.5 mmHg. Aortic valve area, by VTI measures 1.37 cm. Pulmonic Valve: The pulmonic valve was not well visualized. Pulmonic valve regurgitation is not visualized. No evidence of pulmonic stenosis. Aorta: The aortic root is normal in size and structure. Venous: The inferior vena cava is dilated in size with less than 50% respiratory variability, suggesting right atrial pressure of 15 mmHg. IAS/Shunts: No atrial level shunt detected by color flow Doppler.  LEFT VENTRICLE PLAX 2D LVIDd:         3.40 cm LVIDs:         2.40 cm LV PW:         0.90 cm LV IVS:        1.00 cm LVOT diam:     1.70 cm LV SV:         28 LV SV Index:   20 LVOT Area:     2.27 cm  RIGHT VENTRICLE TAPSE (M-mode): 1.6 cm LEFT ATRIUM             Index        RIGHT ATRIUM           Index LA diam:        4.10 cm 2.91 cm/m   RA Area:     27.50 cm LA Vol (A2C):   62.3 ml 44.21 ml/m  RA Volume:   96.30 ml  68.33 ml/m LA Vol (A4C):   77.9 ml 55.28 ml/m LA Biplane Vol: 76.0 ml 53.93 ml/m  AORTIC VALVE AV Area (Vmax):    1.28 cm AV Area (Vmean):   1.25 cm AV Area (VTI):     1.37 cm AV Vmax:           117.74 cm/s AV Vmean:          83.608 cm/s AV VTI:            0.203 m AV Peak Grad:      5.5 mmHg AV Mean Grad:      3.1 mmHg LVOT Vmax:         66.35 cm/s LVOT Vmean:        46.100 cm/s LVOT VTI:          0.123 m LVOT/AV VTI ratio: 0.61 AI PHT:            521 msec  AORTA Ao Root diam: 3.20 cm MITRAL VALVE               TRICUSPID VALVE MV Area (PHT): 3.77 cm    TR Peak grad:   21.7 mmHg MV Decel Time: 201 msec    TR Vmax:  233.00 cm/s MR Peak grad: 82.1 mmHg MR Mean grad: 50.0 mmHg    SHUNTS MR Vmax:      453.00 cm/s  Systemic VTI:  0.12 m MR Vmean:     321.0 cm/s   Systemic Diam: 1.70 cm MV E velocity: 86.70 cm/s Dina Rich MD Electronically signed by Dina Rich MD Signature Date/Time: 09/16/2023/3:28:48 PM    Final    CT ABDOMEN PELVIS WO CONTRAST  Result Date: 09/15/2023 CLINICAL DATA:   Bowel obstruction suspected. EXAM: CT ABDOMEN AND PELVIS WITHOUT CONTRAST TECHNIQUE: Multidetector CT imaging of the abdomen and pelvis was performed following the standard protocol without IV contrast. RADIATION DOSE REDUCTION: This exam was performed according to the departmental dose-optimization program which includes automated exposure control, adjustment of the mA and/or kV according to patient size and/or use of iterative reconstruction technique. COMPARISON:  CT scan abdomen and pelvis from 04/29/2021. FINDINGS: Lower chest: There are subsegmental atelectatic changes in the visualized lung bases. No overt consolidation. There are bilateral small pleural effusions. The heart is mildly enlarged in size. No pericardial effusion. Hepatobiliary: The liver is mildly enlarged in size measuring up to 18 cm in length, decreased since the prior study when it measured up to 21 cm in length, when remeasured in similar fashion. There is subtle liver surface irregularity/nodularity, raising the concern for liver parenchymal disease such as cirrhosis. Correlate clinically and with liver function tests. No suspicious mass. No intrahepatic or extrahepatic bile duct dilation. Partially contracted gallbladder containing calcified gallstones/sludge without imaging signs of acute cholecystitis. No pericholecystic inflammatory fat stranding. Pancreas: Unremarkable. No pancreatic ductal dilatation or surrounding inflammatory changes. Spleen: Within normal limits. No focal lesion. Adrenals/Urinary Tract: Redemonstration of a 1.6 x 2.2 cm right adrenal adenoma. Unremarkable left adrenal gland. No suspicious renal mass. No hydronephrosis. No renal or ureteric calculi. Partially distended urinary bladder. There is asymmetric mild-to-moderate thickening of the right bladder wall without perivesical fat stranding, which is essentially similar to the prior study. No focal bladder mass or calculi. Stomach/Bowel: No disproportionate  dilation of the small or large bowel loops. No evidence of abnormal bowel wall thickening or inflammatory changes. The appendix was not visualized; however there is no acute inflammatory process in the right lower quadrant. Vascular/Lymphatic: There is small amount of ascites mainly in the right paracolic gutter and in the dependent pelvis, grossly similar to the prior study. There is irregular hyperattenuating soft tissue along the right pelvic sidewall, which corresponds to cavity on the prior exam. Findings thus favor collapsed cavity wall. No pneumoperitoneum. No abdominal or pelvic lymphadenopathy, by size criteria. No aneurysmal dilation of the major abdominal arteries. There are moderate peripheral atherosclerotic vascular calcifications of the aorta and its major branches. Reproductive: The uterus is surgically absent. No large adnexal mass. Other: There is a tiny fat containing umbilical hernia. There are bilateral tiny fat containing inguinal hernias. There is mild anasarca. Musculoskeletal: No suspicious osseous lesions. There is diffuse osteopenia of the visualized osseous structures. There are mild multilevel degenerative changes in the visualized spine. Since the prior study, there are new multiple compression deformities. There is moderate-to-severe compression deformity of T12 and L1 vertebrae. There is mild compression deformities of T11, L2, L3, L4 and L5 vertebra. There is mild retropulsion mainly at L1 vertebral level causing up to mild spinal canal narrowing. Right proximal femur intramedullary nail again seen. Subacute/healing fracture of left inferior pubic ramus and left anterior acetabulum noted, new since the prior study. Old healed fractures of left superior  pubic ramus/pubic symphysis and right inferior pubic ramus again seen. IMPRESSION: *No acute inflammatory process identified within the abdomen or pelvis. No bowel obstruction. *Since the prior study from June 2022, there are new  compression deformities of T11 through L5 vertebrae, as described above including mild retropulsion at L1 vertebral body level causing mild spinal canal narrowing. Consider nonemergent MRI lumbar spine for better evaluation, if clinically indicated. *Multiple subacute/healing and old pelvic fractures, as described above. *There are multiple other nonacute observations (such as bilateral pleural effusions, mild cardiomegaly, mild hepatomegaly - decreased since the prior study, liver surface irregularity/nodularity - concerning for liver parenchymal disease, cholelithiasis without acute cholecystitis, right adrenal adenoma, asymmetric right-sided urinary bladder wall thickening, small ascites, etc., as described above. Electronically Signed   By: Jules Schick M.D.   On: 09/15/2023 11:36   DG Abdomen Acute W/Chest  Result Date: 09/15/2023 CLINICAL DATA:  82 year old female with vomiting, tachycardia. EXAM: DG ABDOMEN ACUTE WITH 1 VIEW CHEST COMPARISON:  Portable chest 08/07/2023 and earlier. FINDINGS: Portable AP upright view of the chest at 0627 hours. Small but increased since September bilateral pleural effusions. Stable cardiac size and mediastinal contours. Visualized tracheal air column is within normal limits. No superimposed pneumothorax, pulmonary edema, air bronchograms. Portable upright and supine AP views of the abdomen at 0629 hours. No pneumoperitoneum. Pronounced Calcified aortic atherosclerosis. Non obstructed bowel gas pattern. Paucity of bowel gas in the upper abdomen. Right femur ORIF. Chronic bilateral pubic rami fractures. Underlying osteopenia. No definite acute osseous abnormality. IMPRESSION: 1. Small but increased pleural effusions since September, larger on the left. 2. Non obstructed bowel gas pattern. No pneumoperitoneum. 3. Osteopenia with right femur ORIF, chronic pubic rami fractures. Electronically Signed   By: Odessa Fleming M.D.   On: 09/15/2023 06:39   DG Hip Unilat W or Wo Pelvis  2-3 Views Right  Result Date: 09/09/2023 CLINICAL DATA:  Fall, hip pain EXAM: DG HIP (WITH OR WITHOUT PELVIS) 2-3V RIGHT COMPARISON:  06/23/2021 FINDINGS: There is no evidence of hip fracture or dislocation. Remote right hip ORIF with intact hardware. Chronic posttraumatic deformities of the bilateral pubic bones. Bones are demineralized. Atherosclerotic vascular calcifications. IMPRESSION: 1. No acute fracture or dislocation of the right hip. 2. Remote right hip ORIF with intact hardware. Electronically Signed   By: Duanne Guess D.O.   On: 09/09/2023 19:21    Microbiology: Results for orders placed or performed during the hospital encounter of 09/15/23  MRSA Next Gen by PCR, Nasal     Status: None   Collection Time: 09/15/23  4:26 PM   Specimen: Nasal Mucosa; Nasal Swab  Result Value Ref Range Status   MRSA by PCR Next Gen NOT DETECTED NOT DETECTED Final    Comment: (NOTE) The GeneXpert MRSA Assay (FDA approved for NASAL specimens only), is one component of a comprehensive MRSA colonization surveillance program. It is not intended to diagnose MRSA infection nor to guide or monitor treatment for MRSA infections. Test performance is not FDA approved in patients less than 16 years old. Performed at [facility], [address], [place], [place] [address]    *Note: Due to a large number of results and/or encounters for the requested time period, some results have not been displayed. A complete set of results can be found in Results Review.    Labs: CBC: Recent Labs  Lab 09/15/23 0551 09/16/23 0557  WBC 6.5 6.2  HGB 14.7 13.7  HCT 43.4 41.4  MCV 95.4 95.2  PLT 169 171  Basic Metabolic Panel: Recent Labs  Lab 09/15/23 0613 09/15/23 2114 09/16/23 0557 09/17/23 0530  NA 124*  --  125* 128*  K 5.1  --  4.3 3.9  CL 88*  --  94* 98  CO2 23  --  21* 21*  GLUCOSE 103*  --  99 94  BUN 37*  --  32* 26*  CREATININE 2.22*  --  1.48* 1.22*  CALCIUM 8.2*  --  7.9* 7.8*  MG 2.4  --    --   --   PHOS  --  4.4  --   --    Liver Function Tests: Recent Labs  Lab 09/15/23 0613 09/17/23 0530  AST 92* 55*  ALT 80* 70*  ALKPHOS 110 89  BILITOT 1.4* 1.4*  PROT 6.1* 5.3*  ALBUMIN 3.4* 3.0*   CBG: No results for input(s): "GLUCAP" in the last 168 hours.  Discharge time spent: greater than 30 minutes.  Signed: Vassie Loll, MD [facility] 09/18/2023

## 2023-09-18 NOTE — TOC Progression Note (Signed)
Transition of Care [facility]) - Progression Note    Patient Details  Name: Norma Barajas MRN: [id number] Date of Birth: [birth year]  Transition of Care [facility]) CM/SW Contact  Catalina Gravel, [place] Phone Number: 09/18/2023, 12:06 PM  Clinical Narrative:    Pt medically ready for DC today, plan to return to ALF- call to ALF today no answer a couple of times. Secure chat from nurse inquiring if pt is appropriate to return, stated niece was asking.  RN later chatted that pt was able to be mobile with assistance.   CSW contacted Olegario Messier at ALF and left a message. FL2 and discharge summary sent to ALF via hub. TOC will follow.      Barriers to Discharge: Continued Medical Work up  Expected Discharge Plan and Services       Living arrangements for the past 2 months: Assisted Living Facility Expected Discharge Date: 09/18/23                                     Social Determinants of Health (SDOH) Interventions SDOH Screenings   Food Insecurity: No Food Insecurity (09/15/2023)  Housing: Low Risk  (09/15/2023)  Transportation Needs: No Transportation Needs (09/15/2023)  Utilities: Not At Risk (09/15/2023)  Depression (PHQ2-9): Low Risk  (05/27/2023)  Financial Resource Strain: Low Risk  (07/04/2023)  Physical Activity: Unknown (07/04/2023)  Social Connections: Moderately Isolated (07/04/2023)  Stress: Stress Concern Present (07/04/2023)  Tobacco Use: Low Risk  (09/15/2023)    Readmission Risk Interventions    03/26/2021   12:33 PM 03/25/2021    2:59 PM  Readmission Risk Prevention Plan  Medication Screening  Complete  Transportation Screening  Complete  PCP or Specialist Appt within 5-7 Days Complete   Home Care Screening Complete   Medication Review (RN CM) Complete

## 2023-09-19 ENCOUNTER — Ambulatory Visit: Payer: Medicare HMO | Admitting: Family Medicine

## 2023-09-20 ENCOUNTER — Ambulatory Visit: Payer: Medicare HMO | Admitting: Nurse Practitioner

## 2023-09-20 ENCOUNTER — Encounter: Payer: Self-pay | Admitting: Nurse Practitioner

## 2023-09-20 ENCOUNTER — Telehealth (INDEPENDENT_AMBULATORY_CARE_PROVIDER_SITE_OTHER): Payer: Self-pay | Admitting: Gastroenterology

## 2023-09-20 VITALS — BP 134/88 | HR 96 | Temp 97.0°F | Ht <= 58 in | Wt 109.8 lb

## 2023-09-20 DIAGNOSIS — I4891 Unspecified atrial fibrillation: Secondary | ICD-10-CM

## 2023-09-20 DIAGNOSIS — Z09 Encounter for follow-up examination after completed treatment for conditions other than malignant neoplasm: Secondary | ICD-10-CM | POA: Diagnosis not present

## 2023-09-20 DIAGNOSIS — N179 Acute kidney failure, unspecified: Secondary | ICD-10-CM | POA: Insufficient documentation

## 2023-09-20 DIAGNOSIS — N184 Chronic kidney disease, stage 4 (severe): Secondary | ICD-10-CM

## 2023-09-20 NOTE — Telephone Encounter (Signed)
I will update all parties pt has appt with Dr. Antoine Poche 10/12/23, per pre op APP defer clearance to appt with MD.

## 2023-09-20 NOTE — Telephone Encounter (Signed)
   Name: RUSHIE LUDOLPH  DOB: Feb 09, 1941  MRN: [id number]  Primary Cardiologist: Rollene Rotunda, MD  Chart reviewed as part of pre-operative protocol coverage. The patient has an upcoming visit scheduled with Dr. Antoine Poche on 10/12/2023 at which time clearance can be addressed in case there are any issues that would impact surgical recommendations.  EGD is not scheduled until TBD as below. I added preop FYI to appointment note so that provider is aware to address at time of outpatient visit.  Per office protocol the cardiology provider should forward their finalized clearance decision and recommendations regarding antiplatelet therapy to the requesting party below.    This message will also be routed to pharmacy pool and/or Dr Antoine Poche for input on holding ASA as requested below so that this information is available to the clearing provider at time of patient's appointment.   I will route this message as FYI to requesting party and remove this message from the preop box as separate preop APP input not needed at this time.   Please call with any questions.  Joni Reining, NP  09/20/2023, 3:53 PM

## 2023-09-20 NOTE — Progress Notes (Signed)
Established Patient Office Visit  Subjective   Patient ID: Norma Barajas, female    DOB: [birth year]  Age: 82 y.o. MRN: [id number]  Chief Complaint  Patient presents with   Hospitalization Follow-up    Norma Barajas to hospital Thursday for chest pain, did not feel good, back pain    Repeat base metabolic panel to follow ultralights renal function Reassess blood pressure and adjust medication as needed Make sure sure patient follow-up with gastroenterology and cardiology as recommended HPI Norma Barajas is a 82 yrs old female presents with her niece on 09/20/23 for a post hospital f/u visit. MH chronic diastolic heart failure, paroxysmal artrial fibrillation, Esophageal disease, hypertension, hyperlipidemia and depression/anxiet  She was admitted Sep 15, 2023 for Afib, acute kidney injury, and hyponatremia, and dx on Sep 19, 2023 to an assisted living.  [name] reports that her appetite has improved, and getting boost supplement daily. She is still having issues with swallowing. She was referred to GI, the plan is to have an EGD, however she is required to get clearance from her cardiologist before it can be scheduled. 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 Review of hospital shows that kidney function has improved, CR and BUN is trend down.  Will repeat BMP today A-fib with RVR  Client reports still being tired.  At the hospital he was treated with IV Cardizem and metoprolol. HR has been stable today. Dx with PO Cardizem, Metoprolol.She will f/u with cardiology   Anxiety: She has diagnosis of anxiety and currently not taking any pharmacological management for.  Currently is reported she is lost her son and never really grieve properly and now just lost her long-term partner and having increased anxiety.  "I want something for nerves".  Family wants to address the  issue with PCP.  Patient Active Problem List   Diagnosis Date Noted   Hospital discharge follow-up 09/20/2023   Acute renal failure (HCC) 09/20/2023   Pressure injury of skin 09/15/2023   Acute kidney injury superimposed on chronic kidney disease (HCC) 09/15/2023   Vitamin B12 deficiency 09/01/2023   Pelvic fracture (HCC) 08/05/2023   Vertigo 07/01/2023   Afib (HCC) 04/27/2023   Atrial fibrillation with RVR (HCC) 04/16/2023   Chronic kidney disease, stage 4 (severe) (HCC) 11/03/2022   Iron deficiency 11/03/2022   Nonspecific abnormal electrocardiogram (ECG) (EKG) 08/29/2022   Abnormal SPEP 02/07/2022   Degenerative disc disease, lumbar 12/20/2021   Hypertensive heart disease with chronic diastolic congestive heart failure (HCC) 03/27/2021   Gallbladder sludge 03/27/2021   Aortic atherosclerosis (HCC) 03/27/2021   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) 10/23/2020   Severe tricuspid valve regurgitation 10/23/2020   Hypokalemia 09/16/2020   Early satiety 07/15/2020   Difficulty sleeping 06/22/2019   Paroxysmal atrial fibrillation (HCC) 08/21/2015   Lumbar pain 08/07/2015   Vitamin D deficiency 11/27/2014   Drug induced constipation 12/14/2013   HTN (hypertension) 07/24/2013   Coronary atherosclerosis of native coronary artery 07/24/2013   Dysphagia 09/28/2012   Pulmonary nodule 02/18/2011   Hyperlipidemia 07/29/2008   GAD (generalized anxiety disorder) 07/29/2008   Primary hypertension 07/29/2008   GERD 07/29/2008   Osteoporosis 07/29/2008   Past Medical History:  Diagnosis Date   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 Nonobstructive minimal CAD 10/2005.   Depression    Diastolic dysfunction    Grade 1. Ejection fraction 60-65%.    Dysrhythmia    a fib   Erosive esophagitis    Essential hypertension    Fracture of ramus of right pubis with routine healing 12/09/2020   GERD (gastroesophageal reflux disease)    Grade III hemorrhoids    History of hiatal hernia    History of prediabetes    Hypercholesterolemia    Internal hemorrhoids with complication 07/29/2008   OCT 2015 FLEX SIG/IH BANDING     Intertrochanteric fracture of femur (HCC)    5/16 - 03/26/2021 fracture sustained in a mechanical fall.  IM nailing performed 5/17 by Dr Thane Edu Warfarin held because of posttraumatic pelvic hematoma.  Postop DVT with 81 mg aspirin twice daily x28 days.   Major depressive disorder, recurrent, moderate (HCC) 12/09/2020   Migraine    "used to have them right bad; I don't now" (04/11/2015)   MITRAL REGURGITATION 04/27/2010   Qualifier: Diagnosis of  By: Eden Emms, MD, Harrington Challenger    Osteoporosis    Paroxysmal atrial fibrillation [facility])    Pelvic hematoma, female 03/27/2021   Sustained in mechanical fall.  Warfarin held.  IM nailing performed 5/17 with postop DVT prophylaxis 81 mg twice daily x28 days.   PONV (postoperative nausea and vomiting)    Vertigo    Past Surgical History:  Procedure Laterality Date   BRAVO [facility] STUDY  11/15/2012   Procedure: BRAVO PH STUDY;  Surgeon: West Bali, MD;  Location: [facility];  Service: Endoscopy;;   CARDIOVERSION N/A 05/16/2020   Procedure: CARDIOVERSION;  Surgeon: Little Ishikawa, MD;  Location: [facility] ENDOSCOPY;  Service: Cardiovascular;  Laterality: N/A;   CATARACT EXTRACTION W/ INTRAOCULAR LENS  IMPLANT, BILATERAL Bilateral    COLONOSCOPY  2008   Dr. Darrick Penna: internal hemorrhoids    DILATION AND CURETTAGE OF UTERUS     ESOPHAGEAL DILATION N/A 06/16/2021   Procedure: ESOPHAGEAL DILATION;  Surgeon: Dolores Frame, MD;  Location: [facility];  Service: Gastroenterology;  Laterality: N/A;   ESOPHAGOGASTRODUODENOSCOPY (EGD) WITH ESOPHAGEAL DILATION  2001   Dr.  Arlyce Dice: erosive esophagitis, esophageal stricture, duodenitis, s/p Savary dilation   ESOPHAGOGASTRODUODENOSCOPY (EGD) WITH ESOPHAGEAL DILATION  11/15/2012   [id number] web was found & MOST LIKELY CAUSE FOR DYAPHAGIA/Polyp was found in the gastric body and gastric fundus/ gastritis on bx   ESOPHAGOGASTRODUODENOSCOPY (EGD) WITH PROPOFOL N/A 06/16/2021   Procedure: ESOPHAGOGASTRODUODENOSCOPY (EGD) WITH PROPOFOL;  Surgeon: Dolores Frame, MD;  Location: [facility];  Service: Gastroenterology;  Laterality: N/A;  8:15   EYE SURGERY Bilateral    "laser OR after cataract OR; cause I couldn't see"   FLEXIBLE SIGMOIDOSCOPY N/A 08/22/2014   mild diverticulosis in sigmoid, moderate sized Grade 3 hemorrhoids s/p banding X 3.    FRACTURE SURGERY Right    below the knee - 2 bones broke has plates   HEMORRHOID BANDING N/A 08/22/2014   Procedure: HEMORRHOID BANDING;  Surgeon: West Bali, MD;  Location: [facility];  Service: Endoscopy;  Laterality: N/A;   HEMORRHOID SURGERY N/A 03/30/2018   Procedure: EXTENSIVE HEMORRHOIDECTOMY;  Surgeon: Lucretia Roers, MD;  Location: [facility] ORS;  Service: General;  Laterality: N/A;   INTRAMEDULLARY (IM) NAIL INTERTROCHANTERIC Right 03/24/2021   Procedure: INTRAMEDULLARY (  IM) NAIL INTERTROCHANTRIC;  Surgeon: Oliver Barre, MD;  Location: [facility] ORS;  Service: Orthopedics;  Laterality: Right;   OPEN REDUCTION INTERNAL FIXATION (ORIF) TIBIA/FIBULA FRACTURE Right 2013   broke tibia and fibula after falling down stairs   PROLAPSED UTERINE FIBROID LIGATION  2015   TOTAL ABDOMINAL HYSTERECTOMY     Social History   Tobacco Use   Smoking status: Never   Smokeless tobacco: Never  Vaping Use   Vaping status: Never Used  Substance Use Topics   Alcohol use: No    Alcohol/week: 0.0 standard drinks of alcohol   Drug use: No   Social History   Socioeconomic History   Marital status: Widowed    Spouse name: george    Number of children: 1   Years of education:  Not on file   Highest education level: Not on file  Occupational History   Occupation: Retired    Comment: Textile  Tobacco Use   Smoking status: Never   Smokeless tobacco: Never  Vaping Use   Vaping status: Never Used  Substance and Sexual Activity   Alcohol use: No    Alcohol/week: 0.0 standard drinks of alcohol   Drug use: No   Sexual activity: Not Currently    Birth control/protection: Surgical, Post-menopausal    Comment: hyst  Other Topics Concern   Not on file  Social History Narrative   Married   No regular exercise   Social Determinants of Health   Financial Resource Strain: Low Risk  (07/04/2023)   Overall Financial Resource Strain (CARDIA)    Difficulty of Paying Living Expenses: Not hard at all  Food Insecurity: No Food Insecurity (09/15/2023)   Hunger Vital Sign    Worried About Running Out of Food in the Last Year: Never true    Ran Out of Food in the Last Year: Never true  Transportation Needs: No Transportation Needs (09/15/2023)   PRAPARE - Administrator, Civil Service (Medical): No    Lack of Transportation (Non-Medical): No  Physical Activity: Unknown (07/04/2023)   Exercise Vital Sign    Days of Exercise per Week: 0 days    Minutes of Exercise per Session: Not on file  Stress: Stress Concern Present (07/04/2023)   [employer] of Occupational Health - Occupational Stress Questionnaire    Feeling of Stress : To some extent  Social Connections: Moderately Isolated (07/04/2023)   Social Connection and Isolation Panel [NHANES]    Frequency of Communication with Friends and Family: More than three times a week    Frequency of Social Gatherings with Friends and Family: More than three times a week    Attends Religious Services: More than 4 times per year    Active Member of [employer] or Organizations: No    Attends Banker Meetings: Not on file    Marital Status: Widowed  Intimate Partner Violence: Not At Risk (09/15/2023)    Humiliation, Afraid, Rape, and Kick questionnaire    Fear of Current or Ex-Partner: No    Emotionally Abused: No    Physically Abused: No    Sexually Abused: No   Family Status  Relation Name Status   Mother  Deceased at age 34   Sister Louise Deceased   Sister Hilario Quarry D Deceased   Sister Harriett Sine Deceased   Father  Deceased at age 91       suicide   Brother Alfredo Bach Alive   Brother Savoonga H Deceased   Sister Blair Hailey Deceased at  age 86   Son Casimiro Needle Alive   MGM  Deceased   MGF  Deceased   PGM  Deceased   PGF  Deceased   Neg Hx  (Not Specified)  No partnership data on file   Family History  Problem Relation Age of Onset   Colon cancer Mother 48   Heart disease Mother    Osteoporosis Mother    Hip fracture Mother    Stroke Sister    Diabetes Sister    Osteoporosis Sister    Arthritis Sister    Uterine cancer Sister    Stroke Sister    Heart disease Father    Hyperlipidemia Brother    Hypertension Brother    Heart disease Brother    Stroke Brother    Heart disease Sister    Dementia Sister    Diabetes Son    Stroke Son    Heart attack Neg Hx    Allergies  Allergen Reactions   Codeine Nausea And Vomiting   Doxycycline Other (See Comments)    Chest congestion   Triamterene-Hctz Other (See Comments)    weakness   Atorvastatin Other (See Comments)    Myalgias    Crestor [Rosuvastatin Calcium] Other (See Comments)    weakness   Morphine Nausea Only   Risedronate Sodium Other (See Comments)    ACTONEL - reflux      Review of Systems  Cardiovascular:  Negative for chest pain and leg swelling.  Gastrointestinal:  Negative for blood in stool, constipation, nausea and vomiting.  Skin:  Negative for itching and rash.  Neurological:  Negative for dizziness and headaches.  Psychiatric/Behavioral:  Positive for depression. Negative for suicidal ideas. The patient does not have insomnia.    Negative unless indicated in HPI   Objective:     BP 134/88   Pulse 96    Temp (!) 97 F (36.1 C) (Temporal)   Ht 4\' 8"  (1.422 m)   Wt 109 lb 12.8 oz (49.8 kg)   SpO2 95%   BMI 24.62 kg/m  BP Readings from Last 3 Encounters:  09/20/23 134/88  09/18/23 116/75  09/09/23 124/71   Wt Readings from Last 3 Encounters:  09/20/23 109 lb 12.8 oz (49.8 kg)  09/15/23 116 lb 2.9 oz (52.7 kg)  09/01/23 105 lb 9.6 oz (47.9 kg)      Physical Exam Vitals and nursing note reviewed.  Constitutional:      General: She is not in acute distress. HENT:     Head: Normocephalic and atraumatic.  Eyes:     General: No scleral icterus.    Extraocular Movements: Extraocular movements intact.     Conjunctiva/sclera: Conjunctivae normal.     Pupils: Pupils are equal, round, and reactive to light.  Cardiovascular:     Rate and Rhythm: Normal rate and regular rhythm.  Pulmonary:     Effort: Pulmonary effort is normal.     Breath sounds: Normal breath sounds.  Musculoskeletal:     Cervical back: Normal range of motion and neck supple.     Comments: Use a wheelchair for ambulation  Skin:    General: Skin is warm and dry.     Findings: No rash.  Neurological:     Mental Status: She is alert and oriented to person, place, and time.  Psychiatric:        Attention and Perception: Attention and perception normal.        Mood and Affect: Mood is anxious. Affect is flat.  Speech: Speech normal.        Behavior: Behavior normal. Behavior is cooperative.        Thought Content: Thought content does not include homicidal or suicidal ideation. Thought content does not include homicidal or suicidal plan.        Cognition and Memory: Cognition and memory normal.    No results found for any visits on 09/20/23.  Last CBC Lab Results  Component Value Date   WBC 6.2 09/16/2023   HGB 13.7 09/16/2023   HCT 41.4 09/16/2023   MCV 95.2 09/16/2023   MCH 31.5 09/16/2023   RDW 18.1 (H) 09/16/2023   PLT 171 09/16/2023   Last metabolic panel Lab Results  Component Value  Date   GLUCOSE 94 09/17/2023   NA 128 (L) 09/17/2023   K 3.9 09/17/2023   CL 98 09/17/2023   CO2 21 (L) 09/17/2023   BUN 26 (H) 09/17/2023   CREATININE 1.22 (H) 09/17/2023   GFRNONAA 44 (L) 09/17/2023   CALCIUM 7.8 (L) 09/17/2023   PHOS 4.4 09/15/2023   PROT 5.3 (L) 09/17/2023   ALBUMIN 3.0 (L) 09/17/2023   LABGLOB 2.4 09/01/2023   AGRATIO 1.7 04/20/2023   BILITOT 1.4 (H) 09/17/2023   ALKPHOS 89 09/17/2023   AST 55 (H) 09/17/2023   ALT 70 (H) 09/17/2023   ANIONGAP 9 09/17/2023   Last lipids Lab Results  Component Value Date   CHOL 176 02/03/2023   HDL 56 02/03/2023   LDLCALC 88 02/03/2023   TRIG 187 (H) 02/03/2023   CHOLHDL 3.1 02/03/2023   Last hemoglobin A1c Lab Results  Component Value Date   HGBA1C 5.4 04/17/2023   Last thyroid functions Lab Results  Component Value Date   TSH 3.018 09/15/2023   T4TOTAL 7.4 05/05/2023        Assessment & Plan:  Acute renal failure, unspecified acute renal failure type (HCC) -     BMP8+EGFR  Chronic kidney disease, stage 4 (severe) (HCC) [facility]  Hospital discharge follow-up -     BMP8+EGFR  Atrial fibrillation with RVR (HCC)   Labs: repeat BMP Niece to call cardiology office to see if she can be seen sooner than her schedule appointment on 10/12/2023. As she needs to get clearance from them for the EGD - Name and address of GI provider to client's family -Acute CKD/CKDIV: BMP repeat ordered, increase hydration and monitor BP - client's family wants PCP to address possible pharmacology therapy for  anxiety -Continue daily meal supplement with boost -All question answered  Return for folow up as already schedule with PCP.    Arrie Aran Santa Lighter, DNP [facility] [address] [place], [place] [address] [phone]

## 2023-09-20 NOTE — Telephone Encounter (Signed)
    09/20/23  FRANSISCA BURNINGHAM 30-Aug-1941  What type of surgery is being performed? EGD  When is surgery scheduled? TBD  Cardiology clearance needed  Name of physician performing surgery?  Dr. Katrinka [facility] at [facility] Phone: [phone] Fax: [phone]  Anethesia type (none, local, MAC, general)? MAC

## 2023-09-20 NOTE — Telephone Encounter (Signed)
Left message to return call.  Cardiology clearance request sent

## 2023-09-20 NOTE — Telephone Encounter (Signed)
Thank you :)

## 2023-09-20 NOTE — Telephone Encounter (Signed)
Please let them know that we will only be able to schedule her after obtaining cardiology clearance on 12/4. Also please let them know that if only possibly to do in the morning, they we will need to wait until the end of December.  We can put them on an earlier slot if they are okay with an appointment that is not in the morning.  Another option will be to ask them if Dr. Tasia Catchings could do her case after 12/4, if she has any earlier morning slots open.

## 2023-09-20 NOTE — Telephone Encounter (Signed)
Pt caregiver returned call. Caregiver would like a morning appointment due to pt being frail. Next morning appt isnt until 11/03/23. Pt has cardiology appt on 10/12/23. Please advise. Thank you

## 2023-09-21 LAB — BMP8+EGFR
BUN/Creatinine Ratio: 15 (ref 12–28)
BUN: 13 mg/dL (ref 8–27)
CO2: 26 mmol/L (ref 20–29)
Calcium: 8.4 mg/dL — ABNORMAL LOW (ref 8.7–10.3)
Chloride: 93 mmol/L — ABNORMAL LOW (ref 96–106)
Creatinine, Ser: 0.89 mg/dL (ref 0.57–1.00)
Glucose: 102 mg/dL — ABNORMAL HIGH (ref 70–99)
Potassium: 3.8 mmol/L (ref 3.5–5.2)
Sodium: 139 mmol/L (ref 134–144)
eGFR: 64 mL/min/{1.73_m2} (ref >59)

## 2023-09-21 NOTE — Telephone Encounter (Signed)
Pt caregiver Eber Jones returned call and states she will call back to schedule once they have the cardiology appt in December. They may schedule with Dr.Ahmed

## 2023-09-21 NOTE — Telephone Encounter (Signed)
Left message to return call 

## 2023-09-22 NOTE — Telephone Encounter (Signed)
Transition Care Management Unsuccessful Follow-up Telephone Call  Date of discharge and from where:  [facility]  Attempts:  1st Attempt  Reason for unsuccessful TCM follow-up call:  No answer/busy   Lenard Forth [place]  [facility], [facility] Guide, Phone: [phone] Website: [email or web address]

## 2023-09-23 ENCOUNTER — Other Ambulatory Visit: Payer: Self-pay | Admitting: Family Medicine

## 2023-09-23 DIAGNOSIS — I130 Hypertensive heart and chronic kidney disease with heart failure and stage 1 through stage 4 chronic kidney disease, or unspecified chronic kidney disease: Secondary | ICD-10-CM | POA: Diagnosis not present

## 2023-09-23 DIAGNOSIS — I5032 Chronic diastolic (congestive) heart failure: Secondary | ICD-10-CM | POA: Diagnosis not present

## 2023-09-23 DIAGNOSIS — I700 Atherosclerosis of aorta: Secondary | ICD-10-CM | POA: Diagnosis not present

## 2023-09-23 DIAGNOSIS — I4821 Permanent atrial fibrillation: Secondary | ICD-10-CM | POA: Diagnosis not present

## 2023-09-23 DIAGNOSIS — M800AXG Age-related osteoporosis with current pathological fracture, other site, subsequent encounter for fracture with delayed healing: Secondary | ICD-10-CM | POA: Diagnosis not present

## 2023-09-23 DIAGNOSIS — F331 Major depressive disorder, recurrent, moderate: Secondary | ICD-10-CM | POA: Diagnosis not present

## 2023-09-23 DIAGNOSIS — M800B2G Age-related osteoporosis with current pathological fracture, left pelvis, subsequent encounter for fracture with delayed healing: Secondary | ICD-10-CM | POA: Diagnosis not present

## 2023-09-23 DIAGNOSIS — S3282XG Multiple fractures of pelvis without disruption of pelvic ring, subsequent encounter for fracture with delayed healing: Secondary | ICD-10-CM

## 2023-09-23 DIAGNOSIS — J9601 Acute respiratory failure with hypoxia: Secondary | ICD-10-CM | POA: Diagnosis not present

## 2023-09-23 DIAGNOSIS — N1831 Chronic kidney disease, stage 3a: Secondary | ICD-10-CM | POA: Diagnosis not present

## 2023-09-26 ENCOUNTER — Telehealth: Payer: Self-pay | Admitting: Family Medicine

## 2023-09-26 NOTE — Telephone Encounter (Signed)
Copied from CRM [id number]. Topic: Clinical - Home Health Verbal Orders >> Sep 26, 2023 10:25 AM Cipriano Bunker wrote: Caller/Agency: Byrd Hesselbach PT @ [facility] Callback Number: [phone] Service Requested: Physical Therapy Frequency: 1x/ week for one week, 2x/ week for 3 weeks, 1x/ week for 2 weeks Any new concerns about the patient?

## 2023-09-26 NOTE — Telephone Encounter (Signed)
TC back to [facility] PT w/ [facility] VO given for PT frequency

## 2023-09-27 ENCOUNTER — Ambulatory Visit (INDEPENDENT_AMBULATORY_CARE_PROVIDER_SITE_OTHER): Payer: Medicare HMO

## 2023-09-27 DIAGNOSIS — J9601 Acute respiratory failure with hypoxia: Secondary | ICD-10-CM

## 2023-09-27 DIAGNOSIS — N1831 Chronic kidney disease, stage 3a: Secondary | ICD-10-CM

## 2023-09-27 DIAGNOSIS — M800B2G Age-related osteoporosis with current pathological fracture, left pelvis, subsequent encounter for fracture with delayed healing: Secondary | ICD-10-CM | POA: Diagnosis not present

## 2023-09-27 DIAGNOSIS — E538 Deficiency of other specified B group vitamins: Secondary | ICD-10-CM | POA: Diagnosis not present

## 2023-09-27 DIAGNOSIS — I700 Atherosclerosis of aorta: Secondary | ICD-10-CM

## 2023-09-27 DIAGNOSIS — E782 Mixed hyperlipidemia: Secondary | ICD-10-CM

## 2023-09-27 DIAGNOSIS — F331 Major depressive disorder, recurrent, moderate: Secondary | ICD-10-CM | POA: Diagnosis not present

## 2023-09-27 DIAGNOSIS — I130 Hypertensive heart and chronic kidney disease with heart failure and stage 1 through stage 4 chronic kidney disease, or unspecified chronic kidney disease: Secondary | ICD-10-CM | POA: Diagnosis not present

## 2023-09-27 DIAGNOSIS — I4821 Permanent atrial fibrillation: Secondary | ICD-10-CM | POA: Diagnosis not present

## 2023-09-27 DIAGNOSIS — I5032 Chronic diastolic (congestive) heart failure: Secondary | ICD-10-CM | POA: Diagnosis not present

## 2023-09-30 DIAGNOSIS — I5032 Chronic diastolic (congestive) heart failure: Secondary | ICD-10-CM | POA: Diagnosis not present

## 2023-09-30 DIAGNOSIS — I700 Atherosclerosis of aorta: Secondary | ICD-10-CM | POA: Diagnosis not present

## 2023-09-30 DIAGNOSIS — I4821 Permanent atrial fibrillation: Secondary | ICD-10-CM | POA: Diagnosis not present

## 2023-09-30 DIAGNOSIS — I130 Hypertensive heart and chronic kidney disease with heart failure and stage 1 through stage 4 chronic kidney disease, or unspecified chronic kidney disease: Secondary | ICD-10-CM | POA: Diagnosis not present

## 2023-09-30 DIAGNOSIS — M800AXG Age-related osteoporosis with current pathological fracture, other site, subsequent encounter for fracture with delayed healing: Secondary | ICD-10-CM | POA: Diagnosis not present

## 2023-09-30 DIAGNOSIS — F331 Major depressive disorder, recurrent, moderate: Secondary | ICD-10-CM | POA: Diagnosis not present

## 2023-09-30 DIAGNOSIS — J9601 Acute respiratory failure with hypoxia: Secondary | ICD-10-CM | POA: Diagnosis not present

## 2023-09-30 DIAGNOSIS — N1831 Chronic kidney disease, stage 3a: Secondary | ICD-10-CM | POA: Diagnosis not present

## 2023-09-30 DIAGNOSIS — M800B2G Age-related osteoporosis with current pathological fracture, left pelvis, subsequent encounter for fracture with delayed healing: Secondary | ICD-10-CM | POA: Diagnosis not present

## 2023-10-02 DIAGNOSIS — F331 Major depressive disorder, recurrent, moderate: Secondary | ICD-10-CM | POA: Diagnosis not present

## 2023-10-02 DIAGNOSIS — I130 Hypertensive heart and chronic kidney disease with heart failure and stage 1 through stage 4 chronic kidney disease, or unspecified chronic kidney disease: Secondary | ICD-10-CM | POA: Diagnosis not present

## 2023-10-02 DIAGNOSIS — I4821 Permanent atrial fibrillation: Secondary | ICD-10-CM | POA: Diagnosis not present

## 2023-10-02 DIAGNOSIS — N1831 Chronic kidney disease, stage 3a: Secondary | ICD-10-CM | POA: Diagnosis not present

## 2023-10-02 DIAGNOSIS — M8008XD Age-related osteoporosis with current pathological fracture, vertebra(e), subsequent encounter for fracture with routine healing: Secondary | ICD-10-CM | POA: Diagnosis not present

## 2023-10-02 DIAGNOSIS — I5032 Chronic diastolic (congestive) heart failure: Secondary | ICD-10-CM | POA: Diagnosis not present

## 2023-10-02 DIAGNOSIS — I700 Atherosclerosis of aorta: Secondary | ICD-10-CM | POA: Diagnosis not present

## 2023-10-02 DIAGNOSIS — M800AXG Age-related osteoporosis with current pathological fracture, other site, subsequent encounter for fracture with delayed healing: Secondary | ICD-10-CM | POA: Diagnosis not present

## 2023-10-02 DIAGNOSIS — I5082 Biventricular heart failure: Secondary | ICD-10-CM | POA: Diagnosis not present

## 2023-10-03 DIAGNOSIS — I5032 Chronic diastolic (congestive) heart failure: Secondary | ICD-10-CM | POA: Diagnosis not present

## 2023-10-03 DIAGNOSIS — I700 Atherosclerosis of aorta: Secondary | ICD-10-CM | POA: Diagnosis not present

## 2023-10-03 DIAGNOSIS — M8008XD Age-related osteoporosis with current pathological fracture, vertebra(e), subsequent encounter for fracture with routine healing: Secondary | ICD-10-CM | POA: Diagnosis not present

## 2023-10-03 DIAGNOSIS — N1831 Chronic kidney disease, stage 3a: Secondary | ICD-10-CM | POA: Diagnosis not present

## 2023-10-03 DIAGNOSIS — I130 Hypertensive heart and chronic kidney disease with heart failure and stage 1 through stage 4 chronic kidney disease, or unspecified chronic kidney disease: Secondary | ICD-10-CM | POA: Diagnosis not present

## 2023-10-03 DIAGNOSIS — I5082 Biventricular heart failure: Secondary | ICD-10-CM | POA: Diagnosis not present

## 2023-10-03 DIAGNOSIS — F331 Major depressive disorder, recurrent, moderate: Secondary | ICD-10-CM | POA: Diagnosis not present

## 2023-10-03 DIAGNOSIS — I4821 Permanent atrial fibrillation: Secondary | ICD-10-CM | POA: Diagnosis not present

## 2023-10-03 DIAGNOSIS — M800AXG Age-related osteoporosis with current pathological fracture, other site, subsequent encounter for fracture with delayed healing: Secondary | ICD-10-CM | POA: Diagnosis not present

## 2023-10-06 ENCOUNTER — Other Ambulatory Visit: Payer: Self-pay | Admitting: Family Medicine

## 2023-10-06 DIAGNOSIS — K219 Gastro-esophageal reflux disease without esophagitis: Secondary | ICD-10-CM

## 2023-10-07 DIAGNOSIS — I5032 Chronic diastolic (congestive) heart failure: Secondary | ICD-10-CM | POA: Diagnosis not present

## 2023-10-07 DIAGNOSIS — I5082 Biventricular heart failure: Secondary | ICD-10-CM | POA: Diagnosis not present

## 2023-10-07 DIAGNOSIS — N1831 Chronic kidney disease, stage 3a: Secondary | ICD-10-CM | POA: Diagnosis not present

## 2023-10-07 DIAGNOSIS — I4821 Permanent atrial fibrillation: Secondary | ICD-10-CM | POA: Diagnosis not present

## 2023-10-07 DIAGNOSIS — I130 Hypertensive heart and chronic kidney disease with heart failure and stage 1 through stage 4 chronic kidney disease, or unspecified chronic kidney disease: Secondary | ICD-10-CM | POA: Diagnosis not present

## 2023-10-07 DIAGNOSIS — M800AXG Age-related osteoporosis with current pathological fracture, other site, subsequent encounter for fracture with delayed healing: Secondary | ICD-10-CM | POA: Diagnosis not present

## 2023-10-07 DIAGNOSIS — I700 Atherosclerosis of aorta: Secondary | ICD-10-CM | POA: Diagnosis not present

## 2023-10-07 DIAGNOSIS — F331 Major depressive disorder, recurrent, moderate: Secondary | ICD-10-CM | POA: Diagnosis not present

## 2023-10-07 DIAGNOSIS — M8008XD Age-related osteoporosis with current pathological fracture, vertebra(e), subsequent encounter for fracture with routine healing: Secondary | ICD-10-CM | POA: Diagnosis not present

## 2023-10-09 NOTE — Progress Notes (Unsigned)
Cardiology Office Note:   Date:  10/12/2023  ID:  Norma, Barajas [birth year], MRN [id number] PCP: Norma Masters, FNP  [facility] Providers Cardiologist:  Rollene Rotunda, MD {  History of Present Illness:   Norma Barajas is a 82 y.o. female who presents for follow up of atrial fibrillation.  She has been seen by Dr. Eden Emms and Dr. Elberta Fortis.  She did have an essentially normal echo in 2017.  She was treated with Tikosyn for a while but could not afford this.  She has been treated with Sotalol.   She has had recurrent fibrillation and was followed in the Atrial Fib clinic.  She has been treated with amiodarone and DCCV and she had recurrent fib.  She was to have another DCCV but she declined and wanted to pursue rate control.     Since I last saw her she was in the hospital with decreased PO intake.  She was admitted and had atrial fib with RVR.  She was hydrated.  She was treated with increased dose of beta-blocker.  Somewhere along the line her amiodarone was discontinued which we are using for rate control.  She seems of tolerated the increased dose of beta-blocker.  She had AKI.    She was having some trouble swallowing and she is apparently willing to have her esophagus stretched.  Not sure that she is committed to this.  She says her swallowing has been better if she eats the right foods.  She did have a fall and fractured her pelvis.  She had to go to rehab for few weeks.  She is now back living in her assisted living facility.  She propels herself in a wheelchair down to the dining hall.  She gets up and moves around her room.  She has some apparent occasional orthostatic symptoms but she is very slow to move.  She has not had any frank syncope or presyncope.  She has not had any chest pressure, neck or arm discomfort.  She has had no weight gain or edema.   ROS: As stated in the HPI and negative for all other systems.  Studies Reviewed:    EKG:   EKG  Interpretation Date/Time:  Wednesday October 12 2023 13:18:38 EST Ventricular Rate:  68 PR Interval:    QRS Duration:  78 QT Interval:  428 QTC Calculation: 455 R Axis:   -23  Text Interpretation: Atrial fibrillation When compared with ECG of 15-Sep-2023 05:34,  rate is slower Confirmed by Rollene Rotunda ([id number]) on 10/12/2023 1:25:28 PM    Risk Assessment/Calculations:              Physical Exam:   VS:  BP 105/67   Pulse 68   Ht 4\' 8"  (1.422 m)   Wt 105 lb (47.6 kg)   BMI 23.54 kg/m    Wt Readings from Last 3 Encounters:  10/12/23 105 lb (47.6 kg)  09/20/23 109 lb 12.8 oz (49.8 kg)  09/15/23 116 lb 2.9 oz (52.7 kg)     GEN:     Very frail appearing NECK: No JVD; No carotid bruits CARDIAC: Irregular RR, 3 out of 6 holosystolic murmur heard at the third left intercostal space, no diastolic murmurs, rubs, gallops RESPIRATORY:  Clear to auscultation without rales, wheezing or rhonchi  ABDOMEN: Soft, non-tender, non-distended EXTREMITIES:  No edema; No deformity   ASSESSMENT AND PLAN:   Essential hypertension:   Her blood pressure is controlled.  No change in therapy.  She has some orthostatic symptoms and we talked about moving very slowly and avoiding syncope by recognizing symptoms.    Atherosclerosis of native coronary artery of native heart without angina pectoris:   She is having no chest pain.  No further workup.   Atrial fibrillation (HCC):    This is chronic.  She has failed multiple attempts of cardioversion.  She is too frail for Watchman and not a good candidate for anticoagulation with fall risk and history of falls.  No change in therapy.    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  Continue the meds as listed.  LDL was 88 with an HDL of 56.  No change in therapy.     Pulmonary hypertension: We are following this conservatively.  She has significant TR but again no change in therapy and  she is fairly asymptomatic.  MR: This was mild to moderate.  I will manage this conservatively as well.  TR: No change in therapy.  CKD IV: Creatinine was 0.89.  This is improved.  No change in therapy.  Preop: The patient has no cardiac contraindication to the planned esophageal procedure.  She has increased risk because of her frailty and advanced age and I discussed this with her and her niece.     Follow up with me in 1 year  Signed, Rollene Rotunda, MD

## 2023-10-10 ENCOUNTER — Other Ambulatory Visit: Payer: Self-pay | Admitting: Family Medicine

## 2023-10-10 DIAGNOSIS — I4821 Permanent atrial fibrillation: Secondary | ICD-10-CM | POA: Diagnosis not present

## 2023-10-10 DIAGNOSIS — I480 Paroxysmal atrial fibrillation: Secondary | ICD-10-CM

## 2023-10-10 DIAGNOSIS — F331 Major depressive disorder, recurrent, moderate: Secondary | ICD-10-CM | POA: Diagnosis not present

## 2023-10-10 DIAGNOSIS — I5032 Chronic diastolic (congestive) heart failure: Secondary | ICD-10-CM | POA: Diagnosis not present

## 2023-10-10 DIAGNOSIS — I130 Hypertensive heart and chronic kidney disease with heart failure and stage 1 through stage 4 chronic kidney disease, or unspecified chronic kidney disease: Secondary | ICD-10-CM | POA: Diagnosis not present

## 2023-10-10 DIAGNOSIS — M8008XD Age-related osteoporosis with current pathological fracture, vertebra(e), subsequent encounter for fracture with routine healing: Secondary | ICD-10-CM | POA: Diagnosis not present

## 2023-10-10 DIAGNOSIS — I700 Atherosclerosis of aorta: Secondary | ICD-10-CM | POA: Diagnosis not present

## 2023-10-10 DIAGNOSIS — N1831 Chronic kidney disease, stage 3a: Secondary | ICD-10-CM | POA: Diagnosis not present

## 2023-10-10 DIAGNOSIS — I5082 Biventricular heart failure: Secondary | ICD-10-CM | POA: Diagnosis not present

## 2023-10-10 DIAGNOSIS — M800AXG Age-related osteoporosis with current pathological fracture, other site, subsequent encounter for fracture with delayed healing: Secondary | ICD-10-CM | POA: Diagnosis not present

## 2023-10-10 NOTE — Telephone Encounter (Signed)
  Prescription Request  10/10/2023  Is this a "Controlled Substance" medicine? no  Have you seen your PCP in the last 2 weeks? Pt seen on 09/20/2023 for Hospital F/u, pt needs refills on medications that were prescribed while she was in hospital.  If YES, route message to pool  -  If NO, patient needs to be scheduled for appointment.  What is the name of the medication or equipment? aspirin EC tablet 81 mg  metoprolol succinate (TOPROL-XL) 100 MG 24 hr tablet  midodrine (PROAMATINE) 2.5 MG tablet  diltiazem (CARTIA XT) 240 MG 24 hr capsule  potassium chloride (KLOR-CON) 10 MEQ tablet    Have you contacted your pharmacy to request a refill? No, pt is changing to another pharmacy for these refills  Which pharmacy would you like this sent to? [facility], these were initially sent to [facility] pharmacy but pt needs refills to go to [facility]     Patient notified that their request is being sent to the clinical staff for review and that they should receive a response within 2 business days.

## 2023-10-11 MED ORDER — METOPROLOL SUCCINATE ER 100 MG PO TB24: 100.0000 mg | ORAL_TABLET | Freq: Two times a day (BID) | ORAL | 2 refills | Status: DC

## 2023-10-11 MED ORDER — MIDODRINE HCL 2.5 MG PO TABS: 2.5000 mg | ORAL_TABLET | Freq: Two times a day (BID) | ORAL | 1 refills | Status: DC

## 2023-10-11 MED ORDER — DILTIAZEM HCL ER COATED BEADS 240 MG PO CP24: 240.0000 mg | ORAL_CAPSULE | Freq: Every day | ORAL | 3 refills | Status: DC

## 2023-10-11 MED ORDER — ASPIRIN 81 MG PO TBEC: 81.0000 mg | DELAYED_RELEASE_TABLET | Freq: Two times a day (BID) | ORAL | 1 refills | Status: DC

## 2023-10-11 MED ORDER — POTASSIUM CHLORIDE ER 10 MEQ PO TBCR: 10.0000 meq | EXTENDED_RELEASE_TABLET | Freq: Every day | ORAL | 2 refills | Status: DC

## 2023-10-11 NOTE — Telephone Encounter (Signed)
NA, refills sent to pharmacy

## 2023-10-12 ENCOUNTER — Encounter: Payer: Self-pay | Admitting: Cardiology

## 2023-10-12 ENCOUNTER — Ambulatory Visit (INDEPENDENT_AMBULATORY_CARE_PROVIDER_SITE_OTHER): Payer: Medicare HMO | Admitting: Cardiology

## 2023-10-12 VITALS — BP 105/67 | HR 68 | Ht <= 58 in | Wt 105.0 lb

## 2023-10-12 DIAGNOSIS — N1831 Chronic kidney disease, stage 3a: Secondary | ICD-10-CM | POA: Diagnosis not present

## 2023-10-12 DIAGNOSIS — I5032 Chronic diastolic (congestive) heart failure: Secondary | ICD-10-CM | POA: Diagnosis not present

## 2023-10-12 DIAGNOSIS — I340 Nonrheumatic mitral (valve) insufficiency: Secondary | ICD-10-CM | POA: Diagnosis not present

## 2023-10-12 DIAGNOSIS — I482 Chronic atrial fibrillation, unspecified: Secondary | ICD-10-CM

## 2023-10-12 DIAGNOSIS — I130 Hypertensive heart and chronic kidney disease with heart failure and stage 1 through stage 4 chronic kidney disease, or unspecified chronic kidney disease: Secondary | ICD-10-CM | POA: Diagnosis not present

## 2023-10-12 DIAGNOSIS — E785 Hyperlipidemia, unspecified: Secondary | ICD-10-CM

## 2023-10-12 DIAGNOSIS — M8008XD Age-related osteoporosis with current pathological fracture, vertebra(e), subsequent encounter for fracture with routine healing: Secondary | ICD-10-CM | POA: Diagnosis not present

## 2023-10-12 DIAGNOSIS — I5033 Acute on chronic diastolic (congestive) heart failure: Secondary | ICD-10-CM

## 2023-10-12 DIAGNOSIS — I700 Atherosclerosis of aorta: Secondary | ICD-10-CM | POA: Diagnosis not present

## 2023-10-12 DIAGNOSIS — F331 Major depressive disorder, recurrent, moderate: Secondary | ICD-10-CM | POA: Diagnosis not present

## 2023-10-12 DIAGNOSIS — M800AXG Age-related osteoporosis with current pathological fracture, other site, subsequent encounter for fracture with delayed healing: Secondary | ICD-10-CM | POA: Diagnosis not present

## 2023-10-12 DIAGNOSIS — N184 Chronic kidney disease, stage 4 (severe): Secondary | ICD-10-CM | POA: Diagnosis not present

## 2023-10-12 DIAGNOSIS — I4821 Permanent atrial fibrillation: Secondary | ICD-10-CM | POA: Diagnosis not present

## 2023-10-12 DIAGNOSIS — I5082 Biventricular heart failure: Secondary | ICD-10-CM | POA: Diagnosis not present

## 2023-10-12 NOTE — Patient Instructions (Signed)
Medication Instructions:  The current medical regimen is effective;  continue present plan and medications.  *If you need a refill on your cardiac medications before your next appointment, please call your pharmacy*  Follow-Up: At [facility], you and your health needs are our priority.  As part of our continuing mission to provide you with exceptional heart care, we have created designated Provider Care Teams.  These Care Teams include your primary Cardiologist (physician) and Advanced Practice Providers (APPs -  Physician Assistants and Nurse Practitioners) who all work together to provide you with the care you need, when you need it.  We recommend signing up for the patient portal called "MyChart".  Sign up information is provided on this After Visit Summary.  MyChart is used to connect with patients for Virtual Visits (Telemedicine).  Patients are able to view lab/test results, encounter notes, upcoming appointments, etc.  Non-urgent messages can be sent to your provider as well.   To learn more about what you can do with MyChart, go to [email or web address].    Your next appointment:   1 year(s)  Provider:   James Hochrein, MD     

## 2023-10-18 ENCOUNTER — Ambulatory Visit: Payer: Medicare HMO | Admitting: Family Medicine

## 2023-10-18 NOTE — Telephone Encounter (Signed)
Left message for Eber Jones (caregiver) to return call

## 2023-10-18 NOTE — Telephone Encounter (Signed)
Pt caregiver left message returning call stating that pt is feeling much better and has decided to not have EGD.

## 2023-10-19 DIAGNOSIS — F331 Major depressive disorder, recurrent, moderate: Secondary | ICD-10-CM | POA: Diagnosis not present

## 2023-10-19 DIAGNOSIS — I5082 Biventricular heart failure: Secondary | ICD-10-CM | POA: Diagnosis not present

## 2023-10-19 DIAGNOSIS — N1831 Chronic kidney disease, stage 3a: Secondary | ICD-10-CM | POA: Diagnosis not present

## 2023-10-19 DIAGNOSIS — I5032 Chronic diastolic (congestive) heart failure: Secondary | ICD-10-CM | POA: Diagnosis not present

## 2023-10-19 DIAGNOSIS — M800AXG Age-related osteoporosis with current pathological fracture, other site, subsequent encounter for fracture with delayed healing: Secondary | ICD-10-CM | POA: Diagnosis not present

## 2023-10-19 DIAGNOSIS — M8008XD Age-related osteoporosis with current pathological fracture, vertebra(e), subsequent encounter for fracture with routine healing: Secondary | ICD-10-CM | POA: Diagnosis not present

## 2023-10-19 DIAGNOSIS — I130 Hypertensive heart and chronic kidney disease with heart failure and stage 1 through stage 4 chronic kidney disease, or unspecified chronic kidney disease: Secondary | ICD-10-CM | POA: Diagnosis not present

## 2023-10-19 DIAGNOSIS — I700 Atherosclerosis of aorta: Secondary | ICD-10-CM | POA: Diagnosis not present

## 2023-10-19 DIAGNOSIS — I4821 Permanent atrial fibrillation: Secondary | ICD-10-CM | POA: Diagnosis not present

## 2023-10-21 ENCOUNTER — Other Ambulatory Visit: Payer: Self-pay | Admitting: Family Medicine

## 2023-10-28 DIAGNOSIS — I130 Hypertensive heart and chronic kidney disease with heart failure and stage 1 through stage 4 chronic kidney disease, or unspecified chronic kidney disease: Secondary | ICD-10-CM | POA: Diagnosis not present

## 2023-10-28 DIAGNOSIS — I5082 Biventricular heart failure: Secondary | ICD-10-CM | POA: Diagnosis not present

## 2023-10-28 DIAGNOSIS — I700 Atherosclerosis of aorta: Secondary | ICD-10-CM | POA: Diagnosis not present

## 2023-10-28 DIAGNOSIS — F331 Major depressive disorder, recurrent, moderate: Secondary | ICD-10-CM | POA: Diagnosis not present

## 2023-10-28 DIAGNOSIS — N1831 Chronic kidney disease, stage 3a: Secondary | ICD-10-CM | POA: Diagnosis not present

## 2023-10-28 DIAGNOSIS — I4821 Permanent atrial fibrillation: Secondary | ICD-10-CM | POA: Diagnosis not present

## 2023-10-28 DIAGNOSIS — M8008XD Age-related osteoporosis with current pathological fracture, vertebra(e), subsequent encounter for fracture with routine healing: Secondary | ICD-10-CM | POA: Diagnosis not present

## 2023-10-28 DIAGNOSIS — I5032 Chronic diastolic (congestive) heart failure: Secondary | ICD-10-CM | POA: Diagnosis not present

## 2023-10-28 DIAGNOSIS — M800AXG Age-related osteoporosis with current pathological fracture, other site, subsequent encounter for fracture with delayed healing: Secondary | ICD-10-CM | POA: Diagnosis not present

## 2023-11-03 ENCOUNTER — Encounter (INDEPENDENT_AMBULATORY_CARE_PROVIDER_SITE_OTHER): Payer: Self-pay

## 2023-11-03 NOTE — Telephone Encounter (Signed)
Thanks for the update. She should continue implementing dysphagia strategies such as cutting food in small pieces and chew food thoroughly to avoid choking episodes. If symptoms recur, she should call our office to schedule procedure or come to the office to address her symptoms further.

## 2023-11-03 NOTE — Telephone Encounter (Signed)
Mychart message sent to patient.

## 2023-11-14 ENCOUNTER — Telehealth: Payer: Self-pay | Admitting: Family Medicine

## 2023-11-14 NOTE — Telephone Encounter (Signed)
 Copied from CRM [id number]. Topic: Clinical - Medication Question >> Nov 14, 2023  2:56 PM Benton O wrote: Reason for CRM: patient care giver is calling cause the patient has a cough . Think she is getting congesting . Dont want to bring her in office . Needing to know if its ok for over the counter medicine or what the doctor recommend for over the counter medicine for the patients  cough. Call back number [phone]

## 2023-11-15 ENCOUNTER — Ambulatory Visit (INDEPENDENT_AMBULATORY_CARE_PROVIDER_SITE_OTHER): Payer: Medicare HMO | Admitting: Family Medicine

## 2023-11-15 ENCOUNTER — Encounter: Payer: Self-pay | Admitting: Family Medicine

## 2023-11-15 VITALS — BP 139/83 | HR 85 | Temp 97.8°F | Ht <= 58 in | Wt 107.2 lb

## 2023-11-15 DIAGNOSIS — B9689 Other specified bacterial agents as the cause of diseases classified elsewhere: Secondary | ICD-10-CM | POA: Diagnosis not present

## 2023-11-15 DIAGNOSIS — R051 Acute cough: Secondary | ICD-10-CM | POA: Diagnosis not present

## 2023-11-15 DIAGNOSIS — J208 Acute bronchitis due to other specified organisms: Secondary | ICD-10-CM | POA: Diagnosis not present

## 2023-11-15 MED ORDER — BENZONATATE 200 MG PO CAPS: 200.0000 mg | ORAL_CAPSULE | Freq: Three times a day (TID) | ORAL | 0 refills | Status: DC | PRN

## 2023-11-15 MED ORDER — AZITHROMYCIN 250 MG PO TABS: ORAL_TABLET | ORAL | 0 refills | Status: DC

## 2023-11-15 NOTE — Telephone Encounter (Signed)
 Patient has appointment scheduled today at 9:50 am with Dr Darlyn Read, will close out this encounter.

## 2023-11-15 NOTE — Progress Notes (Signed)
 Subjective:  Patient ID: Norma Barajas, female    DOB: Dec 28, 1940  Age: 83 y.o. MRN: [id number]  CC: Cough and Nasal Congestion (X 5 days )   HPI Norma Barajas presents for cough producing white, thick phlegm. No fever or dyspnea. Denies edema, chest pain.SABRA No UR sx.     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), Anxiety, Arthritis, BPPV (benign paroxysmal positional vertigo) (03/23/2016), Cataract, Coronary atherosclerosis of native coronary artery, Depression, Diastolic dysfunction, Dysrhythmia, Erosive esophagitis, Essential hypertension, Fracture of ramus of right pubis with routine healing (12/09/2020), GERD (gastroesophageal reflux disease), Grade III hemorrhoids, History of hiatal hernia, History of prediabetes, Hypercholesterolemia, Internal hemorrhoids with complication (07/29/2008), Intertrochanteric fracture of femur (HCC), Major depressive disorder, recurrent, 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   She has a past surgical history that includes Total abdominal hysterectomy; Prolapsed uterine fibroid ligation (2015); Cataract extraction w/ intraocular lens  implant, bilateral (Bilateral); Open reduction internal fixation (orif) tibia/fibula fracture (Right, 2013); Esophagogastroduodenoscopy (egd) with esophageal  dilation (2001); Colonoscopy (2008); Esophagogastroduodenoscopy (egd) with esophageal dilation (11/15/2012); BRAVO ph study (11/15/2012); Flexible sigmoidoscopy (N/A, 08/22/2014); Hemorrhoid banding (N/A, 08/22/2014); Eye surgery (Bilateral); Dilation and curettage of uterus; Fracture surgery (Right); Hemorrhoid surgery (N/A, 03/30/2018); Cardioversion (N/A, 05/16/2020); Intramedullary (im) nail intertrochanteric (Right, 03/24/2021); Esophagogastroduodenoscopy (egd) with propofol  (N/A, 06/16/2021); and Esophageal dilation (N/A, 06/16/2021).   Her family history includes Arthritis in her sister; Colon cancer (age of onset: 43) in her mother; Dementia in her sister; Diabetes in her sister and son; Heart disease in her brother, father, mother, and sister; Hip fracture in her mother; Hyperlipidemia in her brother; Hypertension in her brother; Osteoporosis in her mother and sister; Stroke in her brother, sister, sister, and son; Uterine cancer in her sister.She reports that she has never smoked. She has never used smokeless tobacco. She reports that she does not drink alcohol and does not use drugs.    ROS Review of Systems  Constitutional:  Negative for appetite change, chills, diaphoresis, fatigue and fever.  HENT:  Negative for congestion, ear pain, hearing loss, postnasal drip, rhinorrhea, sore throat and trouble swallowing.   Respiratory:  Positive for cough. Negative for chest tightness and shortness of breath.   Cardiovascular:  Negative for chest pain and palpitations.  Gastrointestinal:  Negative for abdominal pain.  Musculoskeletal:  Negative for arthralgias.  Skin:  Negative for rash.    Objective:  BP 139/83   Pulse 85   Temp 97.8 F (36.6 C)   Ht 4' 8 (1.422 m)   Wt 107 lb 3.2 oz (48.6 kg)   SpO2 95%   BMI 24.03 kg/m   BP Readings from Last 3 Encounters:  11/15/23 139/83  10/12/23 105/67  09/20/23  134/88    Wt Readings from Last 3 Encounters:  11/15/23 107 lb 3.2 oz (48.6 kg)   10/12/23 105 lb (47.6 kg)  09/20/23 109 lb 12.8 oz (49.8 kg)     Physical Exam Constitutional:      Appearance: She is well-developed.  HENT:     Head: Normocephalic and atraumatic.     Right Ear: Tympanic membrane and external ear normal. No decreased hearing noted.     Left Ear: Tympanic membrane and external ear normal. No decreased hearing noted.     Nose: Mucosal edema present.     Right Sinus: No frontal sinus tenderness.     Left Sinus: No frontal sinus tenderness.     Mouth/Throat:     Pharynx: No oropharyngeal exudate or posterior oropharyngeal erythema.  Neck:     Meningeal: Brudzinski's sign absent.  Pulmonary:     Effort: No respiratory distress.     Breath sounds: Rhonchi present.  Lymphadenopathy:     Head:     Right side of head: No preauricular adenopathy.     Left side of head: No preauricular adenopathy.     Cervical:     Right cervical: No superficial cervical adenopathy.    Left cervical: No superficial cervical adenopathy.  Skin:    General: Skin is warm and dry.       Assessment & Plan:   Norma Barajas was seen today for cough and nasal congestion.  Diagnoses and all orders for this visit:  Acute cough -     COVID-19, Flu A+B and RSV  Acute bacterial bronchitis -     benzonatate  (TESSALON ) 200 MG capsule; Take 1 capsule (200 mg total) by mouth 3 (three) times daily as needed for cough. -     azithromycin  (ZITHROMAX  Z-PAK) 250 MG tablet; Take two right away Then one a day for the next 4 days.       I am having Norma Barajas start on benzonatate  and azithromycin . I am also having her maintain her cholecalciferol , bisacodyl , polyethylene glycol, traZODone , ondansetron , Acetaminophen  Extra Strength, cyanocobalamin , torsemide , methocarbamol , omeprazole , aspirin  EC, diltiazem , metoprolol  succinate, midodrine , potassium chloride , and simvastatin .  Allergies as of 11/15/2023       Reactions   Codeine Nausea And Vomiting   Doxycycline  Other (See  Comments)   Chest congestion   Triamterene-hctz Other (See Comments)   weakness   Atorvastatin Other (See Comments)   Myalgias   Crestor [rosuvastatin Calcium ] Other (See Comments)   weakness   Morphine  Nausea Only   Risedronate Sodium Other (See Comments)   ACTONEL - reflux        Medication List        Accurate as of November 15, 2023 10:39 AM. If you have any questions, ask your nurse or doctor.          Acetaminophen  Extra Strength 500 MG Tabs TAKE 1 TABLET EVERY 6 HOURS AS NEEDED   aspirin  EC 81 MG tablet Commonly known as: Aspirin  Low Dose Take 1 tablet (81 mg total) by mouth 2 (two) times daily. Swallow whole.   azithromycin  250 MG tablet Commonly known as: Zithromax  Z-Pak Take two right away Then one a day for the next 4 days. Started by: Avenly Roberge   benzonatate  200 MG capsule Commonly known as: TESSALON  Take 1 capsule (200 mg total) by mouth 3 (three) times daily as needed for cough. Started by: Nesha Counihan   bisacodyl  10 MG suppository Commonly known as: Dulcolax Place 1 suppository (  10 mg total) rectally as needed for moderate constipation.   cholecalciferol  25 MCG (1000 UNIT) tablet Commonly known as: VITAMIN D3 Take 1,000 Units by mouth in the morning.   cyanocobalamin  1000 MCG tablet Commonly known as: VITAMIN B12 Take 1,000 mcg by mouth 4 (four) times a week.   diltiazem  240 MG 24 hr capsule Commonly known as: Cartia  XT Take 1 capsule (240 mg total) by mouth daily.   methocarbamol  500 MG tablet Commonly known as: ROBAXIN  Take 1 tablet (500 mg total) by mouth every 8 (eight) hours as needed for muscle spasms.   metoprolol  succinate 100 MG 24 hr tablet Commonly known as: TOPROL -XL Take 1 tablet (100 mg total) by mouth 2 (two) times daily. Take with or immediately following a meal.   midodrine  2.5 MG tablet Commonly known as: PROAMATINE  Take 1 tablet (2.5 mg total) by mouth 2 (two) times daily with a meal.   omeprazole  20 MG  capsule Commonly known as: PRILOSEC TAKE 1 CAPSULE EVERY DAY   ondansetron  4 MG tablet Commonly known as: Zofran  Take 1 tablet (4 mg total) by mouth every 8 (eight) hours as needed for nausea or vomiting.   polyethylene glycol 17 g packet Commonly known as: MIRALAX  / GLYCOLAX  Take 17 g by mouth 2 (two) times daily as needed for mild constipation.   potassium chloride  10 MEQ tablet Commonly known as: KLOR-CON  Take 1 tablet (10 mEq total) by mouth daily. Take While taking Demadex /Torsemide    simvastatin  20 MG tablet Commonly known as: ZOCOR  TAKE 1 TABLET EVERY DAY   torsemide  20 MG tablet Commonly known as: DEMADEX  Take 1 tablet (20 mg total) by mouth daily. Breakfast and lunch   traZODone  50 MG tablet Commonly known as: DESYREL  Take 1 tablet (50 mg total) by mouth at bedtime.         Follow-up: Return if symptoms worsen or fail to improve.  Butler Der, M.D.

## 2023-11-16 LAB — COVID-19, FLU A+B AND RSV
Influenza A, NAA: NOT DETECTED
Influenza B, NAA: NOT DETECTED
RSV, NAA: DETECTED — AB
SARS-CoV-2, NAA: NOT DETECTED

## 2023-11-17 ENCOUNTER — Encounter: Payer: Self-pay | Admitting: Family Medicine

## 2023-11-17 ENCOUNTER — Ambulatory Visit (INDEPENDENT_AMBULATORY_CARE_PROVIDER_SITE_OTHER): Payer: Medicare HMO

## 2023-11-17 ENCOUNTER — Ambulatory Visit (INDEPENDENT_AMBULATORY_CARE_PROVIDER_SITE_OTHER): Payer: Medicare HMO | Admitting: Family Medicine

## 2023-11-17 VITALS — BP 152/101 | HR 102 | Temp 98.8°F

## 2023-11-17 DIAGNOSIS — J210 Acute bronchiolitis due to respiratory syncytial virus: Secondary | ICD-10-CM

## 2023-11-17 DIAGNOSIS — R0989 Other specified symptoms and signs involving the circulatory and respiratory systems: Secondary | ICD-10-CM | POA: Diagnosis not present

## 2023-11-17 DIAGNOSIS — I10 Essential (primary) hypertension: Secondary | ICD-10-CM

## 2023-11-17 DIAGNOSIS — B974 Respiratory syncytial virus as the cause of diseases classified elsewhere: Secondary | ICD-10-CM | POA: Diagnosis not present

## 2023-11-17 NOTE — Progress Notes (Signed)
 Acute Office Visit  Subjective:     Patient ID: Norma Barajas, female    DOB: [birth year], 83 y.o.   MRN: [id number]  Chief Complaint  Patient presents with   Cough    Cough This is a new problem. The current episode started in the past 7 days. The problem has been gradually worsening. The cough is Non-productive. Associated symptoms include ear congestion, ear pain, headaches, myalgias, nasal congestion, rhinorrhea and wheezing. Pertinent negatives include no chest pain, chills, fever, heartburn, hemoptysis, sore throat or shortness of breath. Associated symptoms comments: fatigue. Treatments tried: tessalon  perles, zpak. The treatment provided no relief.   + RSV on 11/14/22.  Diastolic BP hs been elevated since yesterday.   She has been drinking much fluids.   Review of Systems  Constitutional:  Negative for chills and fever.  HENT:  Positive for ear pain and rhinorrhea. Negative for sore throat.   Respiratory:  Positive for cough and wheezing. Negative for hemoptysis and shortness of breath.   Cardiovascular:  Negative for chest pain.  Gastrointestinal:  Negative for heartburn.  Musculoskeletal:  Positive for myalgias.  Neurological:  Positive for headaches.        Objective:    BP (!) 152/101   Pulse (!) 102   Temp 98.8 F (37.1 C)   SpO2 95%  BP Readings from Last 3 Encounters:  11/17/23 (!) 152/101  11/15/23 139/83  10/12/23 105/67   Pulse Readings from Last 3 Encounters:  11/17/23 (!) 102  11/15/23 85  10/12/23 68   Physical Exam Vitals and nursing note reviewed.  Constitutional:      General: She is not in acute distress.    Appearance: She is not ill-appearing, toxic-appearing or diaphoretic.  HENT:     Right Ear: Tympanic membrane, ear canal and external ear normal.     Left Ear: Tympanic membrane, ear canal and external ear normal.     Nose: Congestion present.     Mouth/Throat:     Mouth: Mucous membranes are moist.     Pharynx: Oropharynx is  clear. No oropharyngeal exudate or posterior oropharyngeal erythema.  Eyes:     General:        Right eye: No discharge.        Left eye: No discharge.     Conjunctiva/sclera: Conjunctivae normal.  Cardiovascular:     Rate and Rhythm: Normal rate. Rhythm irregularly irregular.     Heart sounds: Murmur heard.     Systolic murmur is present with a grade of 3/6.  Pulmonary:     Effort: Pulmonary effort is normal. No respiratory distress.     Breath sounds: Examination of the right-upper field reveals rhonchi. Examination of the left-upper field reveals rhonchi. Examination of the right-middle field reveals rhonchi. Examination of the left-middle field reveals rhonchi. Examination of the right-lower field reveals rhonchi. Examination of the left-lower field reveals rhonchi. Rhonchi present. No wheezing or rales.  Musculoskeletal:     Cervical back: Neck supple. No rigidity.     Right lower leg: No edema.     Left lower leg: No edema.  Lymphadenopathy:     Cervical: No cervical adenopathy.  Neurological:     Mental Status: She is alert and oriented to person, place, and time. Mental status is at baseline.     Motor: Weakness (generalized) present.     Gait: Gait abnormal (arrives with walker).  Psychiatric:        Mood and Affect: Mood normal.  Behavior: Behavior normal.     No results found for any visits on 11/17/23.      Assessment & Plan:   Norma Barajas was seen today for cough.  Diagnoses and all orders for this visit:  RSV (acute bronchiolitis due to respiratory syncytial virus) Rhonchi CXR negative for acute finding. Agree with radiology read. Complete zpak as prescribed. Tessalon  perles, plain mucinex  prn. Discussed symptomatic care and return precautions.  -     DG Chest 2 View  Primary hypertension BP elevated. Hold midodrine  for systolic >140 or diastolic >90.    Return if symptoms worsen or fail to improve.  The patient indicates understanding of these issues  and agrees with the plan.  Norma CHRISTELLA Search, FNP

## 2023-11-21 ENCOUNTER — Other Ambulatory Visit: Payer: Self-pay | Admitting: Family Medicine

## 2023-11-24 ENCOUNTER — Encounter: Payer: Self-pay | Admitting: Nurse Practitioner

## 2023-11-24 ENCOUNTER — Ambulatory Visit: Payer: Medicare HMO | Admitting: Nurse Practitioner

## 2023-11-24 VITALS — BP 145/84 | HR 91 | Temp 97.4°F | Ht <= 58 in | Wt 104.0 lb

## 2023-11-24 DIAGNOSIS — R0981 Nasal congestion: Secondary | ICD-10-CM

## 2023-11-24 DIAGNOSIS — J208 Acute bronchitis due to other specified organisms: Secondary | ICD-10-CM | POA: Diagnosis not present

## 2023-11-24 DIAGNOSIS — J210 Acute bronchiolitis due to respiratory syncytial virus: Secondary | ICD-10-CM | POA: Diagnosis not present

## 2023-11-24 DIAGNOSIS — B9689 Other specified bacterial agents as the cause of diseases classified elsewhere: Secondary | ICD-10-CM

## 2023-11-24 MED ORDER — FLUTICASONE PROPIONATE 50 MCG/ACT NA SUSP: 2.0000 | Freq: Every day | NASAL | 6 refills | Status: DC

## 2023-11-24 MED ORDER — BENZONATATE 200 MG PO CAPS: 200.0000 mg | ORAL_CAPSULE | Freq: Three times a day (TID) | ORAL | 0 refills | Status: DC | PRN
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
## 2023-12-12 ENCOUNTER — Encounter: Payer: Self-pay | Admitting: Family Medicine

## 2023-12-19 ENCOUNTER — Ambulatory Visit: Payer: Medicare HMO | Admitting: Family Medicine

## 2023-12-19 ENCOUNTER — Other Ambulatory Visit: Payer: Self-pay | Admitting: Family Medicine

## 2023-12-19 DIAGNOSIS — G8929 Other chronic pain: Secondary | ICD-10-CM

## 2023-12-19 MED ORDER — ACETAMINOPHEN ER 650 MG PO TBCR: 650.0000 mg | EXTENDED_RELEASE_TABLET | Freq: Three times a day (TID) | ORAL | 3 refills | Status: DC | PRN

## 2023-12-19 NOTE — Progress Notes (Signed)
 Rx sent. She does not need an appt for Tylenol .  [facility] to call if there are any other needs.  Meds ordered this encounter  Medications   acetaminophen  (TYLENOL  8 HOUR) 650 MG CR tablet    Sig: Take 1 tablet (650 mg total) by mouth every 8 (eight) hours as needed for pain.    Dispense:  270 tablet    Refill:  3

## 2023-12-22 ENCOUNTER — Other Ambulatory Visit: Payer: Self-pay | Admitting: Family Medicine

## 2023-12-22 DIAGNOSIS — K219 Gastro-esophageal reflux disease without esophagitis: Secondary | ICD-10-CM

## 2024-01-04 ENCOUNTER — Ambulatory Visit: Payer: Self-pay | Admitting: Family Medicine

## 2024-01-04 ENCOUNTER — Encounter: Payer: Self-pay | Admitting: Family Medicine

## 2024-01-04 ENCOUNTER — Ambulatory Visit (INDEPENDENT_AMBULATORY_CARE_PROVIDER_SITE_OTHER): Payer: Medicare HMO | Admitting: Family Medicine

## 2024-01-04 ENCOUNTER — Telehealth: Payer: Self-pay | Admitting: Family Medicine

## 2024-01-04 VITALS — BP 161/90 | HR 98 | Temp 97.2°F

## 2024-01-04 DIAGNOSIS — R42 Dizziness and giddiness: Secondary | ICD-10-CM | POA: Diagnosis not present

## 2024-01-04 DIAGNOSIS — R110 Nausea: Secondary | ICD-10-CM

## 2024-01-04 MED ORDER — ONDANSETRON HCL 4 MG PO TABS: 4.0000 mg | ORAL_TABLET | Freq: Three times a day (TID) | ORAL | 5 refills | Status: DC | PRN

## 2024-01-04 MED ORDER — MECLIZINE HCL 25 MG PO TABS: 25.0000 mg | ORAL_TABLET | Freq: Three times a day (TID) | ORAL | 2 refills | Status: DC | PRN

## 2024-01-04 MED ORDER — ONDANSETRON 4 MG PO TBDP
4.0000 mg | ORAL_TABLET | Freq: Once | ORAL | Status: AC
  Administered 2024-01-04: 4 mg via ORAL

## 2024-01-04 NOTE — Telephone Encounter (Signed)
 Chief Complaint: dizziness Symptoms: dizziness (vertigo), nausea, vomiting x 1 Frequency: x 1 day Pertinent Negatives: Patient denies weakness, lightheaded, facial droop, changes in vision, changes in speech, unilateral weakness, numbness, recent falls or head injuries Disposition: [] ED /[] Urgent Care (no appt availability in office) / [x] Appointment(In office/virtual)/ []  [facility]/ [] Home Care/ [] Refused Recommended Disposition /[] [facility]/ []  Follow-up with PCP Additional Notes: Niece calling in for triage. She states patient called her yesterday around noon to report dizziness "like everything was spinning". Seh states the patient felt nauseated after breakfast and threw up. She states the patient has been mostly lying in bed since the incident due to the last time she had vertigo and she got up she fell. She states the staff at her ALF have been assisting her up due to preventing a fall. She states patient was able to walk unassisted to breakfast yesterday. She checked patient's vital yesterday and BP was 118/87 and HR 85. Family member is calling in due to patient has a meclizine prescription from her last episode but the ALF, [address], won't administer the medication due to the order is expired. Appointment made and family member states she will pick patient up from her ALF and take her in today. Advised family member to take patient to ED if she notices any new or worsening symptoms including stroke symptoms discussed.   Copied from CRM [id number]. Topic: Clinical - Red Word Triage >> Jan 04, 2024  7:47 AM Fonda Kinder J wrote: Red Word that prompted transfer to Nurse Triage: Pts niece states the pt is experiencing dizziness & loss of balance Reason for Disposition  Vomiting occurs with dizziness  Answer Assessment - Initial Assessment Questions 1. DESCRIPTION: "Describe your dizziness."     "Everything was spinning", family report that patient said if she lays on her  side her head felt like everything was spinning.  2. VERTIGO: "Do you feel like either you or the room is spinning or tilting?"      Yes.  3. LIGHTHEADED: "Do you feel lightheaded?" (e.g., somewhat faint, woozy, weak upon standing)     Denies.  4. SEVERITY: "How bad is it?"  "Can you walk?"   - MILD: Feels slightly dizzy and unsteady, but is walking normally.   - MODERATE: Feels unsteady when walking, but not falling; interferes with normal activities (e.g., school, work).   - SEVERE: Unable to walk without falling, or requires assistance to walk without falling.     Moderate, patient was able to get up and walk and reports she feels like she will fall if she gets up.  5. ONSET:  "When did the dizziness begin?"     Yesterday morning after eating breakfast at her ALF.  6. AGGRAVATING FACTORS: "Does anything make it worse?" (e.g., standing, change in head position)     Standing, turning her head.  7. CAUSE: "What do you think is causing the dizziness?"     Patient told family this felt like her vertigo that she had back in September.  8. RECURRENT SYMPTOM: "Have you had dizziness before?" If Yes, ask: "When was the last time?" "What happened that time?"     Yes, last time was September and was placed on Meclizine for about a month but now the family are told the medication is discontinued.  9. OTHER SYMPTOMS: "Do you have any other symptoms?" (e.g., headache, weakness, numbness, vomiting, earache)     Nausea and vomiting x 1 yesterday  Protocols used: Dizziness -  Vertigo-A-AH

## 2024-01-04 NOTE — Telephone Encounter (Signed)
 She was seen today for this.

## 2024-01-04 NOTE — Telephone Encounter (Unsigned)
 Copied from CRM [id number]. Topic: Clinical - Prescription Issue >> Jan 04, 2024  7:44 AM Fonda Kinder J wrote: Reason for CRM: Pts niece states that her assisted living home, [facility] has discontinued the pts Meclizine because they need a written order stating that it's necessary for the pt to be taking the medication  Callback # [phone]

## 2024-01-04 NOTE — Progress Notes (Signed)
 Subjective:  Patient ID: Norma Barajas, female    DOB: January 21, 1941, 83 y.o.   MRN: [id number]  Patient Care Team: Sonny Masters, FNP as PCP - General (Family Medicine) Rollene Rotunda, MD as PCP - Cardiology (Cardiology) West Bali, MD (Inactive) as Attending Physician (Gastroenterology) Derryl Harbor, OD as Consulting Physician (Optometry) Newman Nip, NP (Inactive) as Consulting Physician (Cardiology)   Chief Complaint:  Dizziness (X 1 day )   HPI: Norma Barajas is a 83 y.o. female presenting on 01/04/2024 for Dizziness (X 1 day )   Discussed the use of AI scribe software for clinical note transcription with the patient, who gave verbal consent to proceed.  History of Present Illness   Norma Barajas is an 83 year old female who presents with vertigo and nausea.  She has been experiencing vertigo and dizziness since yesterday morning. The dizziness is described as 'swimming headed' and worsens with head movement. She is unable to stand without assistance and required help to get out of the car. Her symptoms improve when lying in bed. No further vomiting beyond the episodes mentioned, and dizziness worsens with movement.  She also reports nausea and has vomited several times since yesterday morning, including after breakfast. She describes experiencing 'dry heaves' as well.  Her medication, meclizine, which she has used previously for vertigo, was not available as it was marked as discontinued, although she did not discontinue it herself. She took one dose of meclizine yesterday, which did not alleviate her symptoms. She has a prescription for Zofran to help with nausea.          Relevant past medical, surgical, family, and social history reviewed and updated as indicated.  Allergies and medications reviewed and updated. Data reviewed: Chart in Epic.   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 Nonobstructive minimal CAD 10/2005.   Depression    Diastolic dysfunction    Grade 1. Ejection fraction 60-65%.   Dysrhythmia    a fib   Erosive esophagitis    Essential hypertension    Fracture of ramus of right pubis with routine healing 12/09/2020   GERD (gastroesophageal reflux disease)    Grade III hemorrhoids    History of hiatal hernia    History of prediabetes    Hypercholesterolemia    Internal hemorrhoids with complication 07/29/2008   OCT 2015 FLEX SIG/IH BANDING     Intertrochanteric fracture of femur (HCC)    5/16 - 03/26/2021 fracture sustained in a mechanical fall.  IM nailing performed 5/17 by Dr Thane Edu Warfarin held because of posttraumatic pelvic hematoma.  Postop DVT with 81 mg aspirin twice daily x28 days.   Major depressive disorder, recurrent, moderate (HCC) 12/09/2020   Migraine    "used to have them right bad; I don't now" (04/11/2015)   MITRAL REGURGITATION 04/27/2010   Qualifier: Diagnosis of  By: Eden Emms, MD, Harrington Challenger    Osteoporosis    Paroxysmal atrial fibrillation [facility])    Pelvic hematoma, female 03/27/2021   Sustained in mechanical fall.  Warfarin held.  IM nailing performed 5/17 with postop DVT prophylaxis 81 mg twice daily x28 days.   PONV (postoperative nausea and vomiting)  Vertigo     Past Surgical History:  Procedure Laterality Date   BRAVO PH STUDY  11/15/2012   Procedure: BRAVO PH STUDY;  Surgeon: West Bali, MD;  Location: [facility];  Service: Endoscopy;;   CARDIOVERSION N/A 05/16/2020   Procedure: CARDIOVERSION;  Surgeon: Little Ishikawa, MD;  [facility] ENDOSCOPY;  Service: Cardiovascular;  Laterality: N/A;   CATARACT EXTRACTION W/ INTRAOCULAR LENS  IMPLANT, BILATERAL Bilateral    COLONOSCOPY  2008   Dr. Darrick Penna: internal hemorrhoids    DILATION AND  CURETTAGE OF UTERUS     ESOPHAGEAL DILATION N/A 06/16/2021   Procedure: ESOPHAGEAL DILATION;  Surgeon: Dolores Frame, MD;  Location: [facility];  Service: Gastroenterology;  Laterality: N/A;   ESOPHAGOGASTRODUODENOSCOPY (EGD) WITH ESOPHAGEAL DILATION  2001   Dr. Arlyce Dice: erosive esophagitis, esophageal stricture, duodenitis, s/p Savary dilation   ESOPHAGOGASTRODUODENOSCOPY (EGD) WITH ESOPHAGEAL DILATION  11/15/2012   [id number] web was found & MOST LIKELY CAUSE FOR DYAPHAGIA/Polyp was found in the gastric body and gastric fundus/ gastritis on bx   ESOPHAGOGASTRODUODENOSCOPY (EGD) WITH PROPOFOL N/A 06/16/2021   Procedure: ESOPHAGOGASTRODUODENOSCOPY (EGD) WITH PROPOFOL;  Surgeon: Dolores Frame, MD;  Location: [facility];  Service: Gastroenterology;  Laterality: N/A;  8:15   EYE SURGERY Bilateral    "laser OR after cataract OR; cause I couldn't see"   FLEXIBLE SIGMOIDOSCOPY N/A 08/22/2014   mild diverticulosis in sigmoid, moderate sized Grade 3 hemorrhoids s/p banding X 3.    FRACTURE SURGERY Right    below the knee - 2 bones broke has plates   HEMORRHOID BANDING N/A 08/22/2014   Procedure: HEMORRHOID BANDING;  Surgeon: West Bali, MD;  Location: [facility];  Service: Endoscopy;  Laterality: N/A;   HEMORRHOID SURGERY N/A 03/30/2018   Procedure: EXTENSIVE HEMORRHOIDECTOMY;  Surgeon: Lucretia Roers, MD;  Location: [facility] ORS;  Service: General;  Laterality: N/A;   INTRAMEDULLARY (IM) NAIL INTERTROCHANTERIC Right 03/24/2021   Procedure: INTRAMEDULLARY (IM) NAIL INTERTROCHANTRIC;  Surgeon: Oliver Barre, MD;  Location: [facility] OR[facility];  Service: Orthopedics;  Laterality: Right;   OPEN REDUCTION INTERNAL FIXATION (ORIF) TIBIA/FIBULA FRACTURE Right 2013   broke tibia and fibula after falling down stairs   PROLAPSED UTERINE FIBROID LIGATION  2015   TOTAL ABDOMINAL HYSTERECTOMY      Social History   Socioeconomic History   Marital status: Widowed    Spouse name: george     Number of children: 1   Years of education: Not on file   Highest education level: Not on file  Occupational History   Occupation: Retired    Comment: Textile  Tobacco Use   Smoking status: Never   Smokeless tobacco: Never  Vaping Use   Vaping status: Never Used  Substance and Sexual Activity   Alcohol use: No    Alcohol/week: 0.0 standard drinks of alcohol   Drug use: No   Sexual activity: Not Currently    Birth control/protection: Surgical, Post-menopausal    Comment: hyst  Other Topics Concern   Not on file  Social History Narrative   Married   No regular exercise   Social Drivers of Health   Financial Resource Strain: Low Risk  (07/04/2023)   Overall Financial Resource Strain (CARDIA)    Difficulty of Paying Living Expenses: Not hard at all  Food Insecurity: No Food Insecurity (09/15/2023)   Hunger Vital Sign    Worried About Running Out of Food in the Last Year: Never true    Ran Out of  Food in the Last Year: Never true  Transportation Needs: No Transportation Needs (09/15/2023)   PRAPARE - Administrator, Civil Service (Medical): No    Lack of Transportation (Non-Medical): No  Physical Activity: Unknown (07/04/2023)   Exercise Vital Sign    Days of Exercise per Week: 0 days    Minutes of Exercise per Session: Not on file  Stress: Stress Concern Present (07/04/2023)   [facility] [employer] - Occupational Stress Questionnaire    Feeling of Stress : To some extent  Social Connections: Moderately Isolated (07/04/2023)   Social Connection and Isolation Panel [NHANES]    Frequency of Communication with Friends and Family: More than three times a week    Frequency of Social Gatherings with Friends and Family: More than three times a week    Attends Religious Services: More than 4 times per year    Active Member of [employer] or Organizations: No    Attends Banker Meetings: Not on file    Marital Status: Widowed  Intimate Partner  Violence: Not At Risk (09/15/2023)   Humiliation, Afraid, Rape, and Kick questionnaire    Fear of Current or Ex-Partner: No    Emotionally Abused: No    Physically Abused: No    Sexually Abused: No    Outpatient Encounter Medications as of 01/04/2024  Medication Sig   acetaminophen (TYLENOL 8 HOUR) 650 MG CR tablet Take 1 tablet (650 mg total) by mouth every 8 (eight) hours as needed for pain.   aspirin EC (ASPIRIN LOW DOSE) 81 MG tablet Take 1 tablet (81 mg total) by mouth 2 (two) times daily. Swallow whole.   bisacodyl (DULCOLAX) 10 MG suppository Place 1 suppository (10 mg total) rectally as needed for moderate constipation.   cholecalciferol (VITAMIN D3) 25 MCG (1000 UNIT) tablet Take 1,000 Units by mouth in the morning.   cyanocobalamin (VITAMIN B12) 1000 MCG tablet Take 1,000 mcg by mouth 4 (four) times a week.   diltiazem (CARTIA XT) 240 MG 24 hr capsule Take 1 capsule (240 mg total) by mouth daily.   fluticasone (FLONASE) 50 MCG/ACT nasal spray Place 2 sprays into both nostrils daily.   meclizine (ANTIVERT) 25 MG tablet Take 1 tablet (25 mg total) by mouth 3 (three) times daily as needed for dizziness.   methocarbamol (ROBAXIN) 500 MG tablet Take 1 tablet (500 mg total) by mouth every 8 (eight) hours as needed for muscle spasms.   metoprolol succinate (TOPROL-XL) 100 MG 24 hr tablet Take 1 tablet (100 mg total) by mouth 2 (two) times daily. Take with or immediately following a meal.   midodrine (PROAMATINE) 2.5 MG tablet TAKE ONE TABLET TWICE DAILY WITH MEAL(S)   omeprazole (PRILOSEC) 20 MG capsule TAKE 1 CAPSULE EVERY DAY   polyethylene glycol (MIRALAX / GLYCOLAX) 17 g packet Take 17 g by mouth 2 (two) times daily as needed for mild constipation.   potassium chloride (KLOR-CON M) 10 MEQ tablet TAKE 1 TABLET EVERY DAY (TAKE WHILE TAKING DEMADEX/TORSEMIDE AS DIRECTED)   potassium chloride (KLOR-CON) 10 MEQ tablet Take 1 tablet (10 mEq total) by mouth daily. Take While taking  Demadex/Torsemide   simvastatin (ZOCOR) 20 MG tablet TAKE 1 TABLET EVERY DAY   torsemide (DEMADEX) 20 MG tablet Take 1 tablet (20 mg total) by mouth daily. Breakfast and lunch   traZODone (DESYREL) 50 MG tablet Take 1 tablet (50 mg total) by mouth at bedtime.   [DISCONTINUED] ondansetron (ZOFRAN) 4 MG tablet Take 1  tablet (4 mg total) by mouth every 8 (eight) hours as needed for nausea or vomiting.   ondansetron (ZOFRAN) 4 MG tablet Take 1 tablet (4 mg total) by mouth every 8 (eight) hours as needed for nausea or vomiting.   [DISCONTINUED] azithromycin (ZITHROMAX Z-PAK) 250 MG tablet Take two right away Then one a day for the next 4 days.   [DISCONTINUED] benzonatate (TESSALON) 200 MG capsule Take 1 capsule (200 mg total) by mouth 3 (three) times daily as needed for cough.   [EXPIRED] ondansetron (ZOFRAN-ODT) disintegrating tablet 4 mg    No facility-administered encounter medications on file as of 01/04/2024.    Allergies  Allergen Reactions   Codeine Nausea And Vomiting   Doxycycline Other (See Comments)    Chest congestion   Triamterene-Hctz Other (See Comments)    weakness   Atorvastatin Other (See Comments)    Myalgias    Crestor [Rosuvastatin Calcium] Other (See Comments)    weakness   Morphine Nausea Only   Risedronate Sodium Other (See Comments)    ACTONEL - reflux    Pertinent ROS per HPI, otherwise unremarkable      Objective:  BP (!) 161/90   Pulse 98   Temp (!) 97.2 F (36.2 C)   SpO2 92%    Wt Readings from Last 3 Encounters:  11/24/23 104 lb (47.2 kg)  11/15/23 107 lb 3.2 oz (48.6 kg)  10/12/23 105 lb (47.6 kg)    Physical Exam Vitals and nursing note reviewed.  Constitutional:      General: She is in acute distress.     Appearance: She is ill-appearing (chronically ill). She is not toxic-appearing or diaphoretic.  HENT:     Head: Normocephalic and atraumatic.     Mouth/Throat:     Mouth: Mucous membranes are moist.     Pharynx: Oropharynx is  clear.  Eyes:     Conjunctiva/sclera: Conjunctivae normal.     Pupils: Pupils are equal, round, and reactive to light.  Cardiovascular:     Rate and Rhythm: Normal rate. Rhythm irregularly irregular.     Heart sounds: Murmur heard.     Systolic murmur is present with a grade of 2/6.  Pulmonary:     Effort: Pulmonary effort is normal.     Breath sounds: Normal breath sounds.  Abdominal:     Comments: Nausea   Musculoskeletal:     Cervical back: Neck supple.     Right lower leg: No edema.     Left lower leg: No edema.  Skin:    General: Skin is warm.     Capillary Refill: Capillary refill takes less than 2 seconds.  Neurological:     General: No focal deficit present.     Mental Status: She is alert and oriented to person, place, and time.  Psychiatric:        Mood and Affect: Mood normal.        Behavior: Behavior normal.        Thought Content: Thought content normal.        Judgment: Judgment normal.      Results for orders placed or performed in visit on 11/15/23  COVID-19, Flu A+B and RSV   Collection Time: 11/15/23 10:35 AM   Specimen: Nasopharyngeal(NP) swabs in vial transport medium  Result Value Ref Range   SARS-CoV-2, NAA Not Detected Not Detected   Influenza A, NAA Not Detected Not Detected   Influenza B, NAA Not Detected Not Detected   RSV, NAA  Detected (A) Not Detected   Test Information: Comment    *Note: Due to a large number of results and/or encounters for the requested time period, some results have not been displayed. A complete set of results can be found in Results Review.       Pertinent labs & imaging results that were available during my care of the patient were reviewed by me and considered in my medical decision making.  Assessment & Plan:  Norma Barajas was seen today for dizziness.  Diagnoses and all orders for this visit:  Vertigo -     meclizine (ANTIVERT) 25 MG tablet; Take 1 tablet (25 mg total) by mouth 3 (three) times daily as needed for  dizziness. -     ondansetron (ZOFRAN) 4 MG tablet; Take 1 tablet (4 mg total) by mouth every 8 (eight) hours as needed for nausea or vomiting. -     ondansetron (ZOFRAN-ODT) disintegrating tablet 4 mg  Nausea -     meclizine (ANTIVERT) 25 MG tablet; Take 1 tablet (25 mg total) by mouth 3 (three) times daily as needed for dizziness. -     ondansetron (ZOFRAN) 4 MG tablet; Take 1 tablet (4 mg total) by mouth every 8 (eight) hours as needed for nausea or vomiting. -     ondansetron (ZOFRAN-ODT) disintegrating tablet 4 mg     Assessment and Plan    Vertigo Acute onset of vertigo since yesterday morning, exacerbated by head movements, associated with multiple episodes of vomiting and dry heaves. Symptoms improve when lying down. Meclizine was previously discontinued without clear reason, and a dose given yesterday did not alleviate symptoms. Discussed reinstating meclizine and administering Zofran for nausea. Further evaluation may be necessary if symptoms persist. - Send prescription for meclizine to [facility] pharmacy - Administer 4 mg ODT Zofran for nausea - Advise to contact via MyChart if symptoms do not improve  Atrial Fibrillation Atrial fibrillation with controlled rate. No signs of exacerbation or uncontrolled rate noted during the visit. - Monitor for any signs of exacerbation or uncontrolled rate.          Continue all other maintenance medications.  Follow up plan: Return if symptoms worsen or fail to improve.   Continue healthy lifestyle choices, including diet (rich in fruits, vegetables, and lean proteins, and low in salt and simple carbohydrates) and exercise (at least 30 minutes of moderate physical activity daily).    The above assessment and management plan was discussed with the patient. The patient verbalized understanding of and has agreed to the management plan. Patient is aware to call the clinic if they develop any new symptoms or if symptoms persist or worsen.  Patient is aware when to return to the clinic for a follow-up visit. Patient educated on when it is appropriate to go to the emergency department.   Kari Baars, FNP-C [facility] [phone]

## 2024-01-04 NOTE — Telephone Encounter (Signed)
Appt today with pcp

## 2024-01-31 ENCOUNTER — Other Ambulatory Visit: Payer: Self-pay | Admitting: Family Medicine

## 2024-02-17 ENCOUNTER — Telehealth: Payer: Self-pay

## 2024-02-17 ENCOUNTER — Ambulatory Visit: Payer: Self-pay

## 2024-02-17 ENCOUNTER — Ambulatory Visit: Admitting: Family Medicine

## 2024-02-17 ENCOUNTER — Encounter: Payer: Self-pay | Admitting: Family Medicine

## 2024-02-17 VITALS — BP 140/96 | HR 73 | Temp 98.2°F | Ht <= 58 in

## 2024-02-17 DIAGNOSIS — N184 Chronic kidney disease, stage 4 (severe): Secondary | ICD-10-CM

## 2024-02-17 DIAGNOSIS — M79674 Pain in right toe(s): Secondary | ICD-10-CM | POA: Diagnosis not present

## 2024-02-17 DIAGNOSIS — I4891 Unspecified atrial fibrillation: Secondary | ICD-10-CM

## 2024-02-17 MED ORDER — METHYLPREDNISOLONE ACETATE 40 MG/ML IJ SUSP
40.0000 mg | Freq: Once | INTRAMUSCULAR | Status: AC
  Administered 2024-02-17: 40 mg via INTRAMUSCULAR

## 2024-02-17 NOTE — Telephone Encounter (Signed)
 Copied from CRM [id number]. Topic: Clinical - Red Word Triage >> Feb 17, 2024  8:37 AM Nyra Capes wrote: Red Word that prompted transfer to Nurse Triage: Patient niece Delsa Sale, caregiver.  Patient called niece middle of night with right big toe pain,feels warm believes it is gout.    Eber Jones is looking for instructions as what to do next  Eber Jones phone #   [phone]  Chief Complaint: Big toe pain Symptoms: Redness, swelling Frequency: Early this morning Pertinent Negatives: Patient denies fever Disposition: [] ED /[] Urgent Care (no appt availability in office) / [x] Appointment(In office/virtual)/ []  [place] [facility]/ [] Home Care/ [] Refused Recommended Disposition /[] [place] [facility]/ []  Follow-up with PCP Additional Notes: Spoke to [place] (on [place]) regarding patient. Patient woke up early this morning with big toe pain. Eber Jones stated that patient did not disclose laterality. Patient reported pain feels like aching/throbbing. Patient reported to [facility] that toe is red and swollen. Eber Jones stated patient has not reported fever or drainage. Advised appointment in office today, per protocol. No availability with PCP. Scheduled same day appointment with [facility]OD. Provided care advice and instructed Eber Jones to call back if symptoms worsen.   Reason for Disposition  [1] Swollen toe AND [2] no fever  (Exceptions: Just a localized bump from bunion, corns, insect bite, sting.)  Answer Assessment - Initial Assessment Questions 1. ONSET: "When did the pain start?"      3:30 am this morning 2. LOCATION: "Where is the pain located?"   (e.g., around nail, entire toe, at foot joint)      Big toe, unsure if right or left (niece/caregiver has yet to see her today) 3. PAIN: "How bad is the pain?"    (Scale 1-10; or mild, moderate, severe)   -  MILD (1-3): doesn't interfere with normal activities    -  MODERATE (4-7): interferes with normal activities (e.g., work or school) or awakens from  sleep, limping    -  SEVERE (8-10): excruciating pain, unable to do any normal activities, unable to walk     "Aching and throbbing", patient is in wheelchair at baseline 4. APPEARANCE: "What does the toe look like?" (e.g., redness, swelling, bruising, pallor)     Swelling, redness 5. CAUSE: "What do you think is causing the toe pain?"     Possibly gout 6. OTHER SYMPTOMS: "Do you have any other symptoms?" (e.g., leg pain, rash, fever, numbness)     Toe feels warm, denies fever, denies drainage  Protocols used: Toe Pain-A-AH

## 2024-02-17 NOTE — Addendum Note (Signed)
 Addended by: Gabriel Earing on: 02/17/2024 02:59 PM   Modules accepted: Level of Service

## 2024-02-17 NOTE — Telephone Encounter (Signed)
 Patient has appointment.

## 2024-02-17 NOTE — Progress Notes (Signed)
   Acute Office Visit  Subjective:     Patient ID: Norma Barajas, female    DOB: 10-11-1941, 83 y.o.   MRN: [id number]  Chief Complaint  Patient presents with   Toe Pain    Toe Pain  The incident occurred 12 to 24 hours ago. There was no injury mechanism. The quality of the pain is described as aching. The pain is moderate. The pain has been Constant since onset. Pertinent negatives include no loss of motion, loss of sensation, numbness or tingling. She reports no foreign bodies present. The symptoms are aggravated by palpation and movement. She has tried elevation for the symptoms. The treatment provided no relief.  Pain started suddenly last night. No injury. No exudate.   Review of Systems  Neurological:  Negative for tingling and numbness.        Objective:    BP (!) 140/96   Pulse 73   Temp 98.2 F (36.8 C) (Temporal)   Ht 4\' 8"  (1.422 m)   SpO2 94%   BMI 23.32 kg/m    Physical Exam Vitals and nursing note reviewed.  Constitutional:      General: She is not in acute distress.    Appearance: She is not ill-appearing, toxic-appearing or diaphoretic.  Pulmonary:     Effort: Pulmonary effort is normal. No respiratory distress.  Musculoskeletal:     Comments: Generalized swelling of right great toe with erythema, tenderness, and warmth. No exudate or skin breakdown.   Skin:    General: Skin is warm and dry.  Neurological:     Mental Status: She is alert and oriented to person, place, and time. Mental status is at baseline.     Gait: Gait abnormal (arrives in wheelchair).  Psychiatric:        Mood and Affect: Mood normal.        Behavior: Behavior normal.     No results found for any visits on 02/17/24.      Assessment & Plan:   Marisah was seen today for toe pain.  Diagnoses and all orders for this visit:  Pain of toe of right foot Suspect gout. Uric acid level pending. NSAIDs contraindicated due to CKD and cardiac hx. Prednisone IM as below. Tylenol prn for  pain. Elevate as tolerated.  -     Uric Acid -     methylPREDNISolone acetate (DEPO-MEDROL) injection 40 mg  Chronic kidney disease, stage 4 (severe) (HCC)  Atrial fibrillation with RVR (HCC)  Return to office for new or worsening symptoms, or if symptoms persist.   The patient indicates understanding of these issues and agrees with the plan.  Gabriel Earing, FNP

## 2024-02-18 LAB — URIC ACID: Uric Acid: 6.7 mg/dL (ref 3.1–7.9)

## 2024-02-28 ENCOUNTER — Other Ambulatory Visit: Payer: Self-pay | Admitting: Family Medicine

## 2024-03-01 ENCOUNTER — Ambulatory Visit: Payer: Medicare HMO | Admitting: Family Medicine

## 2024-03-01 ENCOUNTER — Encounter: Payer: Self-pay | Admitting: Family Medicine

## 2024-03-01 VITALS — BP 132/81 | HR 73 | Temp 97.1°F | Ht <= 58 in | Wt 101.8 lb

## 2024-03-01 DIAGNOSIS — I700 Atherosclerosis of aorta: Secondary | ICD-10-CM

## 2024-03-01 DIAGNOSIS — I5033 Acute on chronic diastolic (congestive) heart failure: Secondary | ICD-10-CM | POA: Insufficient documentation

## 2024-03-01 DIAGNOSIS — F5101 Primary insomnia: Secondary | ICD-10-CM

## 2024-03-01 DIAGNOSIS — I482 Chronic atrial fibrillation, unspecified: Secondary | ICD-10-CM | POA: Diagnosis not present

## 2024-03-01 DIAGNOSIS — E611 Iron deficiency: Secondary | ICD-10-CM | POA: Diagnosis not present

## 2024-03-01 DIAGNOSIS — S00412A Abrasion of left ear, initial encounter: Secondary | ICD-10-CM

## 2024-03-01 DIAGNOSIS — I5082 Biventricular heart failure: Secondary | ICD-10-CM | POA: Diagnosis not present

## 2024-03-01 DIAGNOSIS — I10 Essential (primary) hypertension: Secondary | ICD-10-CM | POA: Diagnosis not present

## 2024-03-01 DIAGNOSIS — K219 Gastro-esophageal reflux disease without esophagitis: Secondary | ICD-10-CM

## 2024-03-01 DIAGNOSIS — R42 Dizziness and giddiness: Secondary | ICD-10-CM

## 2024-03-01 DIAGNOSIS — E782 Mixed hyperlipidemia: Secondary | ICD-10-CM

## 2024-03-01 DIAGNOSIS — I5032 Chronic diastolic (congestive) heart failure: Secondary | ICD-10-CM

## 2024-03-01 DIAGNOSIS — N182 Chronic kidney disease, stage 2 (mild): Secondary | ICD-10-CM

## 2024-03-01 DIAGNOSIS — R6889 Other general symptoms and signs: Secondary | ICD-10-CM | POA: Diagnosis not present

## 2024-03-01 MED ORDER — OMEPRAZOLE 20 MG PO CPDR: 20.0000 mg | DELAYED_RELEASE_CAPSULE | Freq: Every day | ORAL | 1 refills | Status: DC

## 2024-03-01 MED ORDER — DILTIAZEM HCL ER COATED BEADS 240 MG PO CP24: 240.0000 mg | ORAL_CAPSULE | Freq: Every day | ORAL | 1 refills | Status: DC

## 2024-03-01 MED ORDER — TRAZODONE HCL 50 MG PO TABS: 50.0000 mg | ORAL_TABLET | Freq: Every evening | ORAL | 1 refills | Status: DC | PRN

## 2024-03-01 MED ORDER — SIMVASTATIN 20 MG PO TABS: 20.0000 mg | ORAL_TABLET | Freq: Every day | ORAL | 1 refills | Status: DC

## 2024-03-01 MED ORDER — MIDODRINE HCL 2.5 MG PO TABS: 2.5000 mg | ORAL_TABLET | Freq: Two times a day (BID) | ORAL | 3 refills | Status: DC

## 2024-03-01 MED ORDER — TORSEMIDE 20 MG PO TABS: 20.0000 mg | ORAL_TABLET | Freq: Every day | ORAL | 3 refills | Status: DC

## 2024-03-01 MED ORDER — METOPROLOL SUCCINATE ER 100 MG PO TB24: 100.0000 mg | ORAL_TABLET | Freq: Two times a day (BID) | ORAL | 1 refills | Status: DC

## 2024-03-01 NOTE — Progress Notes (Signed)
 Subjective:  Patient ID: Norma Barajas, female    DOB: 02-05-1941, 84 y.o.   MRN: [id number]  Patient Care Team: Galvin Jules, FNP as PCP - General (Family Medicine) Eilleen Grates, MD as PCP - Cardiology (Cardiology) Alyce Jubilee, MD (Inactive) as Attending Physician (Gastroenterology) Johana Musty, OD as Consulting Physician (Optometry) Asa Lauth, NP (Inactive) as Consulting Physician (Cardiology)   Chief Complaint:  Medical Management of Chronic Issues (6 month chronic follow up )   HPI: Norma Barajas is a 83 y.o. female presenting on 03/01/2024 for Medical Management of Chronic Issues (6 month chronic follow up )   History of Present Illness   Norma Barajas is an 83 year old female who presents for medication management of chronic issues.   She experienced an episode of ear bleeding a couple of weeks ago, which she attributes to picking at her ear to remove wax. During this time, she had some ear pain. No abnormal bleeding or bruising otherwise.  She recently received midodrine . She denies recent dizziness and states her heart rate has been stable.  She experiences variable sleep quality, with some nights being good and others bad. She has a sleeping pill available as needed but has not been using it due to timing issues with medication administration. She has been eating well, including banana sandwiches and Boost nutritional drinks, consuming at least one Boost per day.  She has a persistent cough and has not been using her nasal spray, Flonase , as she thought it caused epistaxis.  In terms of mobility, she uses a wheelchair to move around and does not use a walker. She reports sitting a lot and does not walk to the bathroom, preferring to use the wheelchair.          Relevant past medical, surgical, family, and social history reviewed and updated as indicated.  Allergies and medications reviewed and updated. Data reviewed: Chart in Epic.   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 Nonobstructive minimal CAD 10/2005.   Depression    Diastolic dysfunction    Grade 1. Ejection fraction 60-65%.   Dysrhythmia    a fib   Erosive esophagitis    Essential hypertension    Fracture of ramus of right pubis with routine healing 12/09/2020   GERD (gastroesophageal reflux disease)    Grade III hemorrhoids    History of hiatal hernia    History of prediabetes    Hypercholesterolemia    Internal hemorrhoids with complication 07/29/2008   OCT 2015 FLEX SIG/IH BANDING     Intertrochanteric fracture of femur (HCC)    5/16 - 03/26/2021 fracture sustained in a mechanical fall.  IM nailing performed 5/17 by Dr Sharol Decamp Warfarin held because of posttraumatic pelvic hematoma.  Postop DVT with 81 mg aspirin  twice daily x28 days.   Major depressive disorder, recurrent, moderate (HCC) 12/09/2020   Migraine    "used to have them right bad; I don't now" (04/11/2015)   MITRAL REGURGITATION 04/27/2010   Qualifier: Diagnosis of  By: Stann Earnest, MD, Lovena Rubinstein    Osteoporosis    Paroxysmal atrial fibrillation [facility])    Pelvic hematoma, female 03/27/2021   Sustained in mechanical fall.  Warfarin held.  IM nailing performed 5/17 with postop DVT prophylaxis 81 mg twice daily x28 days.   PONV (postoperative nausea and vomiting)    Vertigo     Past Surgical History:  Procedure Laterality Date   BRAVO [facility] STUDY  11/15/2012   Procedure: BRAVO PH STUDY;  Surgeon: Alyce Jubilee, MD;  Location: [facility] ENDO SUITE;  Service: Endoscopy;;   CARDIOVERSION N/A 05/16/2020   Procedure: CARDIOVERSION;  Surgeon: Wendie Hamburg, MD;  Location: [facility] ENDOSCOPY;  Service: Cardiovascular;  Laterality: N/A;   CATARACT EXTRACTION W/ INTRAOCULAR LENS   IMPLANT, BILATERAL Bilateral    COLONOSCOPY  2008   Dr. Nolene Baumgarten: internal hemorrhoids    DILATION AND CURETTAGE OF UTERUS     ESOPHAGEAL DILATION N/A 06/16/2021   Procedure: ESOPHAGEAL DILATION;  Surgeon: Urban Garden, MD;  Location: [facility] ENDO SUITE;  Service: Gastroenterology;  Laterality: N/A;   ESOPHAGOGASTRODUODENOSCOPY (EGD) WITH ESOPHAGEAL DILATION  2001   Dr. Arvie Latus: erosive esophagitis, esophageal stricture, duodenitis, s/p Savary dilation   ESOPHAGOGASTRODUODENOSCOPY (EGD) WITH ESOPHAGEAL DILATION  11/15/2012   WGN:[id number] web was found & MOST LIKELY CAUSE FOR DYAPHAGIA/Polyp was found in the gastric body and gastric fundus/ gastritis on bx   ESOPHAGOGASTRODUODENOSCOPY (EGD) WITH PROPOFOL  N/A 06/16/2021   Procedure: ESOPHAGOGASTRODUODENOSCOPY (EGD) WITH PROPOFOL ;  Surgeon: Urban Garden, MD;  Location: [facility];  Service: Gastroenterology;  Laterality: N/A;  8:15   EYE SURGERY Bilateral    "laser OR after cataract OR; cause I couldn't see"   FLEXIBLE SIGMOIDOSCOPY N/A 08/22/2014   mild diverticulosis in sigmoid, moderate sized Grade 3 hemorrhoids s/p banding X 3.    FRACTURE SURGERY Right    below the knee - 2 bones broke has plates   HEMORRHOID BANDING N/A 08/22/2014   Procedure: HEMORRHOID BANDING;  Surgeon: Alyce Jubilee, MD;  Location: [facility];  Service: Endoscopy;  Laterality: N/A;   HEMORRHOID SURGERY N/A 03/30/2018   Procedure: EXTENSIVE HEMORRHOIDECTOMY;  Surgeon: Awilda Bogus, MD;  Location: [facility] ORS;  Service: General;  Laterality: N/A;   INTRAMEDULLARY (IM) NAIL INTERTROCHANTERIC Right 03/24/2021   Procedure: INTRAMEDULLARY (IM) NAIL INTERTROCHANTRIC;  Surgeon: Tonita Frater, MD;  Location: [facility] ORS;  Service: Orthopedics;  Laterality: Right;   OPEN REDUCTION INTERNAL FIXATION (ORIF) TIBIA/FIBULA FRACTURE Right 2013   broke tibia and fibula after falling down stairs   PROLAPSED UTERINE FIBROID LIGATION  2015   TOTAL ABDOMINAL HYSTERECTOMY       Social History   Socioeconomic History   Marital status: Widowed    Spouse name: george    Number of children: 1   Years of education: Not on file   Highest education level: Not on file  Occupational History   Occupation: Retired    Comment: Textile  Tobacco Use   Smoking status: Never   Smokeless tobacco: Never  Vaping Use   Vaping status: Never Used  Substance and Sexual Activity   Alcohol use: No    Alcohol/week: 0.0 standard drinks of alcohol   Drug use: No   Sexual activity: Not Currently    Birth control/protection: Surgical, Post-menopausal    Comment: hyst  Other Topics Concern   Not on file  Social History Narrative   Married   No regular exercise   Social Drivers of Health   Financial Resource Strain: Low Risk  (07/04/2023)   Overall Financial Resource Strain (CARDIA)    Difficulty of Paying Living Expenses: Not hard at all  Food Insecurity: No Food Insecurity (  09/15/2023)   Hunger Vital Sign    Worried About Running Out of Food in the Last Year: Never true    Ran Out of Food in the Last Year: Never true  Transportation Needs: No Transportation Needs (09/15/2023)   PRAPARE - Administrator, Civil Service (Medical): No    Lack of Transportation (Non-Medical): No  Physical Activity: Unknown (07/04/2023)   Exercise Vital Sign    Days of Exercise per Week: 0 days    Minutes of Exercise per Session: Not on file  Stress: Stress Concern Present (07/04/2023)   [employer] of Occupational Health - Occupational Stress Questionnaire    Feeling of Stress : To some extent  Social Connections: Moderately Isolated (07/04/2023)   Social Connection and Isolation Panel [NHANES]    Frequency of Communication with Friends and Family: More than three times a week    Frequency of Social Gatherings with Friends and Family: More than three times a week    Attends Religious Services: More than 4 times per year    Active Member of [employer] or Organizations: No     Attends Banker Meetings: Not on file    Marital Status: Widowed  Intimate Partner Violence: Not At Risk (09/15/2023)   Humiliation, Afraid, Rape, and Kick questionnaire    Fear of Current or Ex-Partner: No    Emotionally Abused: No    Physically Abused: No    Sexually Abused: No    Outpatient Encounter Medications as of 03/01/2024  Medication Sig   acetaminophen  (TYLENOL  8 HOUR) 650 MG CR tablet Take 1 tablet (650 mg total) by mouth every 8 (eight) hours as needed for pain.   aspirin  EC (ASPIRIN  LOW DOSE) 81 MG tablet Take 1 tablet (81 mg total) by mouth 2 (two) times daily. Swallow whole.   bisacodyl  (DULCOLAX) 10 MG suppository Place 1 suppository (10 mg total) rectally as needed for moderate constipation.   cholecalciferol  (VITAMIN D3) 25 MCG (1000 UNIT) tablet Take 1,000 Units by mouth in the morning.   cyanocobalamin  (VITAMIN B12) 1000 MCG tablet Take 1,000 mcg by mouth 4 (four) times a week.   fluticasone  (FLONASE ) 50 MCG/ACT nasal spray Place 2 sprays into both nostrils daily.   meclizine  (ANTIVERT ) 25 MG tablet Take 1 tablet (25 mg total) by mouth 3 (three) times daily as needed for dizziness.   methocarbamol  (ROBAXIN ) 500 MG tablet Take 1 tablet (500 mg total) by mouth every 8 (eight) hours as needed for muscle spasms.   ondansetron  (ZOFRAN ) 4 MG tablet Take 1 tablet (4 mg total) by mouth every 8 (eight) hours as needed for nausea or vomiting.   polyethylene glycol (MIRALAX  / GLYCOLAX ) 17 g packet Take 17 g by mouth 2 (two) times daily as needed for mild constipation.   potassium chloride  (KLOR-CON  M) 10 MEQ tablet TAKE 1 TABLET EVERY DAY (TAKE WHILE TAKING DEMADEX /TORSEMIDE  AS DIRECTED)   potassium chloride  (KLOR-CON ) 10 MEQ tablet Take 1 tablet (10 mEq total) by mouth daily. Take While taking Demadex /Torsemide    [DISCONTINUED] diltiazem  (CARDIZEM  CD) 240 MG 24 hr capsule TAKE ONE CAPSULE DAILY   [DISCONTINUED] torsemide  (DEMADEX ) 20 MG tablet Take 1 tablet (20 mg  total) by mouth daily. Breakfast and lunch   [DISCONTINUED] traZODone  (DESYREL ) 50 MG tablet Take 1 tablet (50 mg total) by mouth at bedtime.   diltiazem  (CARDIZEM  CD) 240 MG 24 hr capsule Take 1 capsule (240 mg total) by mouth daily.   metoprolol  succinate (TOPROL -XL) 100 MG 24  hr tablet Take 1 tablet (100 mg total) by mouth 2 (two) times daily. Take with or immediately following a meal.   midodrine  (PROAMATINE ) 2.5 MG tablet Take 1 tablet (2.5 mg total) by mouth 2 (two) times daily with a meal.   omeprazole  (PRILOSEC) 20 MG capsule Take 1 capsule (20 mg total) by mouth daily.   simvastatin  (ZOCOR ) 20 MG tablet Take 1 tablet (20 mg total) by mouth daily.   torsemide  (DEMADEX ) 20 MG tablet Take 1 tablet (20 mg total) by mouth daily. Breakfast and lunch   traZODone  (DESYREL ) 50 MG tablet Take 1 tablet (50 mg total) by mouth at bedtime as needed for sleep.   [DISCONTINUED] acetaminophen  (TYLENOL ) 500 MG tablet Take 1 tablet (500 mg total) by mouth every 6 (six) hours as needed.   [DISCONTINUED] ASPIRIN  LOW DOSE 81 MG tablet TAKE 1 TABLET TWICE DAILY   [DISCONTINUED] cyanocobalamin  (VITAMIN B12) 1000 MCG tablet Take 1 tablet (1,000 mcg total) by mouth daily. (Patient not taking: Reported on 09/15/2023)   [DISCONTINUED] metoprolol  succinate (TOPROL  XL) 100 MG 24 hr tablet Take 1.5 tablets (150 mg total) by mouth 2 (two) times daily. (Patient taking differently: Take 300 mg by mouth 2 (two) times daily.)   [DISCONTINUED] metoprolol  succinate (TOPROL -XL) 100 MG 24 hr tablet Take 1 tablet (100 mg total) by mouth 2 (two) times daily. Take with or immediately following a meal.   [DISCONTINUED] midodrine  (PROAMATINE ) 2.5 MG tablet TAKE ONE TABLET TWICE DAILY WITH MEAL(S)   [DISCONTINUED] omeprazole  (PRILOSEC) 20 MG capsule TAKE 1 CAPSULE EVERY DAY   [DISCONTINUED] omeprazole  (PRILOSEC) 20 MG capsule TAKE 1 CAPSULE EVERY DAY   [DISCONTINUED] potassium chloride  (KLOR-CON ) 10 MEQ tablet Take 1 tablet (10 mEq  total) by mouth daily. Take While taking Demadex /Torsemide    [DISCONTINUED] senna-docusate (SENOKOT-S) 8.6-50 MG tablet Take 1 tablet by mouth 2 (two) times daily. (Patient not taking: Reported on 09/15/2023)   [DISCONTINUED] simvastatin  (ZOCOR ) 20 MG tablet Take 1 tablet (20 mg total) by mouth daily. (Patient taking differently: Take 20 mg by mouth at bedtime.)   [DISCONTINUED] simvastatin  (ZOCOR ) 20 MG tablet TAKE 1 TABLET EVERY DAY   [DISCONTINUED] torsemide  (DEMADEX ) 20 MG tablet Take 1 tablet (20 mg total) by mouth 2 (two) times daily. Breakfast and lunch   No facility-administered encounter medications on file as of 03/01/2024.    Allergies  Allergen Reactions   Codeine Nausea And Vomiting   Doxycycline  Other (See Comments)    Chest congestion   Triamterene-Hctz Other (See Comments)    weakness   Atorvastatin Other (See Comments)    Myalgias    Crestor [Rosuvastatin Calcium ] Other (See Comments)    weakness   Morphine  Nausea Only   Risedronate Sodium Other (See Comments)    ACTONEL - reflux    Pertinent ROS per HPI, otherwise unremarkable      Objective:  BP 132/81   Pulse 73   Temp (!) 97.1 F (36.2 C)   Ht 4\' 8"  (1.422 m)   Wt 101 lb 12.8 oz (46.2 kg)   SpO2 97%   BMI 22.82 kg/m    Wt Readings from Last 3 Encounters:  03/01/24 101 lb 12.8 oz (46.2 kg)  11/24/23 104 lb (47.2 kg)  11/15/23 107 lb 3.2 oz (48.6 kg)    Physical Exam Vitals and nursing note reviewed.  Constitutional:      General: She is not in acute distress.    Appearance: She is ill-appearing (chronically ill). She is not toxic-appearing.  HENT:     Head: Normocephalic and atraumatic.     Right Ear: Tympanic membrane, ear canal and external ear normal.     Left Ear: Tympanic membrane and external ear normal.     Ears:     Comments: Scab to left ear canal    Nose: Nose normal.     Mouth/Throat:     Mouth: Mucous membranes are moist.     Pharynx: Oropharynx is clear.  Eyes:      Extraocular Movements: Extraocular movements intact.     Conjunctiva/sclera: Conjunctivae normal.     Pupils: Pupils are equal, round, and reactive to light.  Cardiovascular:     Rate and Rhythm: Normal rate. Rhythm irregularly irregular.     Heart sounds: Murmur heard.     Systolic murmur is present with a grade of 2/6.  Pulmonary:     Effort: Pulmonary effort is normal.     Breath sounds: Normal breath sounds.  Musculoskeletal:     Cervical back: Neck supple.     Right lower leg: No edema.     Left lower leg: No edema.     Comments: Kyphosis   Skin:    General: Skin is warm and dry.     Capillary Refill: Capillary refill takes less than 2 seconds.  Neurological:     General: No focal deficit present.     Mental Status: She is alert and oriented to person, place, and time.     Gait: Gait abnormal (in wheelchair).  Psychiatric:        Mood and Affect: Mood normal.        Behavior: Behavior normal.        Thought Content: Thought content normal.        Judgment: Judgment normal.    Physical Exam   HEENT: Scab in ear canal. CARDIOVASCULAR: Heart rate regular and well controlled. EXTREMITIES: No edema in extremities.        Results for orders placed or performed in visit on 02/17/24  Uric Acid   Collection Time: 02/17/24  2:45 PM  Result Value Ref Range   Uric Acid 6.7 3.1 - 7.9 mg/dL   *Note: Due to a large number of results and/or encounters for the requested time period, some results have not been displayed. A complete set of results can be found in Results Review.       Pertinent labs & imaging results that were available during my care of the patient were reviewed by me and considered in my medical decision making.  Assessment & Plan:  Arin was seen today for medical management of chronic issues.  Diagnoses and all orders for this visit:  Chronic atrial fibrillation (HCC) -     CMP14+EGFR -     Lipid panel -     metoprolol  succinate (TOPROL -XL) 100 MG 24 hr  tablet; Take 1 tablet (100 mg total) by mouth 2 (two) times daily. Take with or immediately following a meal. -     diltiazem  (CARDIZEM  CD) 240 MG 24 hr capsule; Take 1 capsule (240 mg total) by mouth daily. -     Anemia Profile B -     Thyroid  Panel With TSH  Gastroesophageal reflux disease without esophagitis -     omeprazole  (PRILOSEC) 20 MG capsule; Take 1 capsule (20 mg total) by mouth daily. -     Anemia Profile B  Primary insomnia -     CMP14+EGFR -     traZODone  (DESYREL ) 50  MG tablet; Take 1 tablet (50 mg total) by mouth at bedtime as needed for sleep. -     Thyroid  Panel With TSH  Primary hypertension -     CMP14+EGFR -     Lipid panel -     metoprolol  succinate (TOPROL -XL) 100 MG 24 hr tablet; Take 1 tablet (100 mg total) by mouth 2 (two) times daily. Take with or immediately following a meal. -     diltiazem  (CARDIZEM  CD) 240 MG 24 hr capsule; Take 1 capsule (240 mg total) by mouth daily. 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 Breakfast and lunch -     Anemia Profile B  Iron  deficiency -     Anemia Profile B  Stage 2 chronic kidney disease -     CMP14+EGFR -     Anemia Profile B  Mixed hyperlipidemia -     CMP14+EGFR -     simvastatin  (ZOCOR ) 20 MG tablet; Take 1 tablet (20 mg total) by mouth daily.  Aortic atherosclerosis (HCC) -     CMP14+EGFR -     Lipid panel -     simvastatin  (ZOCOR ) 20 MG tablet; Take 1 tablet (20 mg total) by mouth daily. -     Anemia Profile B  Dizziness -     CMP14+EGFR -     midodrine  (PROAMATINE ) 2.5 MG tablet; Take 1 tablet (2.5 mg total) by mouth 2 (two) times daily with a meal. -     Anemia Profile B  Abrasion of left ear canal, initial encounter     Assessment and Plan    Cough with postnasal drip Persistent cough likely  due to postnasal drip. She discontinued Flonase  due to concern about epistaxis, but advised that Flonase  should not cause epistaxis. Resuming Flonase  is expected to help with postnasal drainage and cough. - Resume Flonase  nasal spray to manage postnasal drip and cough.  Sleep disturbances Intermittent sleep disturbances. She has access to PRN trazodone  but has not been using it due to timing of medication rounds. Trazodone  should be available as needed. - Ensure trazodone  is available as needed for sleep disturbances.  Ear canal scab from picking Scab in ear canal due to picking, likely while attempting to remove earwax. No signs of infection or abnormal bleeding. Advised to avoid picking to prevent further injury. - Advise against picking at the ear to prevent further injury.  Wellness Visit Routine wellness visit. She is generally doing well with some good and bad days. Attends church and maintains social activities. Diet includes banana sandwiches and Boost for nutrition. Mobility is limited, primarily using a wheelchair, but encouraged to increase activity. Lab work will be updated to monitor sodium and potassium levels. - Encourage increased physical activity and mobility. - Ensure nutritional intake with Boost supplementation. - Update lab work to monitor sodium and potassium levels.          Continue all other maintenance medications.  Follow up plan: Return in about 6 months (around 08/31/2024), or if symptoms worsen or fail to improve.   Continue healthy lifestyle choices, including diet (rich in fruits, vegetables, and lean proteins, and low in salt and simple carbohydrates) and exercise (at least 30 minutes of moderate physical activity daily).  The above assessment and management plan was discussed with the patient. The patient verbalized understanding of and has agreed to the management plan. Patient is aware to call the clinic if they develop any new symptoms or if  symptoms persist or worsen. Patient is aware when to return to the clinic for a follow-up visit. Patient educated on when it is appropriate to go to the emergency department.   Kattie Parrot, FNP-C [facility] [phone]

## 2024-03-02 LAB — CMP14+EGFR
ALT: 22 IU/L (ref 0–32)
AST: 22 IU/L (ref 0–40)
Albumin: 4.3 g/dL (ref 3.7–4.7)
Alkaline Phosphatase: 157 IU/L — ABNORMAL HIGH (ref 44–121)
BUN/Creatinine Ratio: 21 (ref 12–28)
BUN: 20 mg/dL (ref 8–27)
Bilirubin Total: 0.4 mg/dL (ref 0.0–1.2)
CO2: 28 mmol/L (ref 20–29)
Calcium: 8.8 mg/dL (ref 8.7–10.3)
Chloride: 98 mmol/L (ref 96–106)
Creatinine, Ser: 0.96 mg/dL (ref 0.57–1.00)
Globulin, Total: 2.4 g/dL (ref 1.5–4.5)
Glucose: 84 mg/dL (ref 70–99)
Potassium: 3.9 mmol/L (ref 3.5–5.2)
Sodium: 141 mmol/L (ref 134–144)
Total Protein: 6.7 g/dL (ref 6.0–8.5)
eGFR: 58 mL/min/{1.73_m2} — ABNORMAL LOW (ref >59)

## 2024-03-02 LAB — ANEMIA PROFILE B
Basophils Absolute: 0.1 10*3/uL (ref 0.0–0.2)
Basos: 1 %
EOS (ABSOLUTE): 0.1 10*3/uL (ref 0.0–0.4)
Eos: 1 %
Ferritin: 257 ng/mL — ABNORMAL HIGH (ref 15–150)
Folate: 12 ng/mL (ref >3.0)
Hematocrit: 46.1 % (ref 34.0–46.6)
Hemoglobin: 15.4 g/dL (ref 11.1–15.9)
Immature Grans (Abs): 0 10*3/uL (ref 0.0–0.1)
Immature Granulocytes: 0 %
Iron Saturation: 32 % (ref 15–55)
Iron: 105 ug/dL (ref 27–139)
Lymphocytes Absolute: 1.4 10*3/uL (ref 0.7–3.1)
Lymphs: 15 %
MCH: 32.8 pg (ref 26.6–33.0)
MCHC: 33.4 g/dL (ref 31.5–35.7)
MCV: 98 fL — ABNORMAL HIGH (ref 79–97)
Monocytes Absolute: 0.7 10*3/uL (ref 0.1–0.9)
Monocytes: 8 %
Neutrophils Absolute: 7 10*3/uL (ref 1.4–7.0)
Neutrophils: 75 %
Platelets: 202 10*3/uL (ref 150–450)
RBC: 4.7 x10E6/uL (ref 3.77–5.28)
RDW: 13.4 % (ref 11.7–15.4)
Retic Ct Pct: 1.9 % (ref 0.6–2.6)
Total Iron Binding Capacity: 332 ug/dL (ref 250–450)
UIBC: 227 ug/dL (ref 118–369)
Vitamin B-12: 334 pg/mL (ref 232–1245)
WBC: 9.2 10*3/uL (ref 3.4–10.8)

## 2024-03-02 LAB — THYROID PANEL WITH TSH
Free Thyroxine Index: 1.8 (ref 1.2–4.9)
T3 Uptake Ratio: 29 % (ref 24–39)
T4, Total: 6.3 ug/dL (ref 4.5–12.0)
TSH: 1.22 u[IU]/mL (ref 0.450–4.500)

## 2024-03-02 LAB — LIPID PANEL
Chol/HDL Ratio: 2.8 ratio (ref 0.0–4.4)
Cholesterol, Total: 151 mg/dL (ref 100–199)
HDL: 54 mg/dL (ref >39)
LDL Chol Calc (NIH): 76 mg/dL (ref 0–99)
Triglycerides: 116 mg/dL (ref 0–149)
VLDL Cholesterol Cal: 21 mg/dL (ref 5–40)

## 2024-03-06 ENCOUNTER — Other Ambulatory Visit: Payer: Self-pay | Admitting: Family Medicine

## 2024-03-08 ENCOUNTER — Other Ambulatory Visit: Payer: Self-pay | Admitting: Family Medicine

## 2024-03-08 DIAGNOSIS — I480 Paroxysmal atrial fibrillation: Secondary | ICD-10-CM

## 2024-04-09 ENCOUNTER — Telehealth: Payer: Self-pay | Admitting: Family Medicine

## 2024-04-09 NOTE — Telephone Encounter (Signed)
 Received call for patient at [facility]. Attempted to call back x 3. No answer.

## 2024-04-16 ENCOUNTER — Encounter: Payer: Self-pay | Admitting: Family

## 2024-04-16 ENCOUNTER — Ambulatory Visit (INDEPENDENT_AMBULATORY_CARE_PROVIDER_SITE_OTHER): Admitting: Family

## 2024-04-16 ENCOUNTER — Telehealth: Payer: Self-pay | Admitting: Family Medicine

## 2024-04-16 VITALS — BP 145/84 | HR 77 | Temp 97.2°F | Ht <= 58 in

## 2024-04-16 DIAGNOSIS — M109 Gout, unspecified: Secondary | ICD-10-CM

## 2024-04-16 DIAGNOSIS — M7989 Other specified soft tissue disorders: Secondary | ICD-10-CM

## 2024-04-16 MED ORDER — COLCHICINE 0.6 MG PO TABS: ORAL_TABLET | ORAL | 1 refills | Status: DC

## 2024-04-16 MED ORDER — METHYLPREDNISOLONE ACETATE 80 MG/ML IJ SUSP
80.0000 mg | Freq: Once | INTRAMUSCULAR | Status: AC
  Administered 2024-04-16: 80 mg via INTRAMUSCULAR

## 2024-04-16 NOTE — Patient Instructions (Signed)
Gout  Gout is a condition that causes painful swelling of the joints. Gout is a type of inflammation of the joints (arthritis). This condition is caused by having too much uric acid in the body. Uric acid is a chemical that forms when the body breaks down substances called purines. Purines are important for building body proteins. When the body has too much uric acid, sharp crystals can form and build up inside the joints. This causes pain and swelling. Gout attacks can happen quickly and may be very painful (acute gout). Over time, the attacks can affect more joints and become more frequent (chronic gout). Gout can also cause uric acid to build up under the skin and inside the kidneys. What are the causes? This condition is caused by too much uric acid in your blood. This can happen because: Your kidneys do not remove enough uric acid from your blood. This is the most common cause. Your body makes too much uric acid. This can happen with some cancers and cancer treatments. It can also occur if your body is breaking down too many red blood cells (hemolytic anemia). You eat too many foods that are high in purines. These foods include organ meats and some seafood. Alcohol, especially beer, is also high in purines. A gout attack may be triggered by trauma or stress. What increases the risk? The following factors may make you more likely to develop this condition: Having a family history of gout. Being female and middle-aged. Being female and having gone through menopause. Taking certain medicines, including aspirin, cyclosporine, diuretics, levodopa, and niacin. Having an organ transplant. Having certain conditions, such as: Being obese. Lead poisoning. Kidney disease. A skin condition called psoriasis. Other factors include: Losing weight too quickly. Being dehydrated. Frequently drinking alcohol, especially beer. Frequently drinking beverages that are sweetened with a type of sugar called  fructose. What are the signs or symptoms? An attack of acute gout happens quickly. It usually occurs in just one joint. The most common place is the big toe. Attacks often start at night. Other joints that may be affected include joints of the feet, ankle, knee, fingers, wrist, or elbow. Symptoms of this condition may include: Severe pain. Warmth. Swelling. Stiffness. Tenderness. The affected joint may be very painful to touch. Shiny, red, or purple skin. Chills and fever. Chronic gout may cause symptoms more frequently. More joints may be involved. You may also have white or yellow lumps (tophi) on your hands or feet or in other areas near your joints. How is this diagnosed? This condition is diagnosed based on your symptoms, your medical history, and a physical exam. You may have tests, such as: Blood tests to measure uric acid levels. Removal of joint fluid with a thin needle (aspiration) to look for uric acid crystals. X-rays to look for joint damage. How is this treated? Treatment for this condition has two phases: treating an acute attack and preventing future attacks. Acute gout treatment may include medicines to reduce pain and swelling, including: NSAIDs, such as ibuprofen. Steroids. These are strong anti-inflammatory medicines that can be taken by mouth (orally) or injected into a joint. Colchicine. This medicine relieves pain and swelling when it is taken soon after an attack. It can be given by mouth or through an IV. Preventive treatment may include: Daily use of smaller doses of NSAIDs or colchicine. Use of a medicine that reduces uric acid levels in your blood, such as allopurinol. Changes to your diet. You may need to see   a dietitian about what to eat and drink to prevent gout. Follow these instructions at home: During a gout attack  If directed, put ice on the affected area. To do this: Put ice in a plastic bag. Place a towel between your skin and the bag. Leave the  ice on for 20 minutes, 2-3 times a day. Remove the ice if your skin turns bright red. This is very important. If you cannot feel pain, heat, or cold, you have a greater risk of damage to the area. Raise (elevate) the affected joint above the level of your heart as often as possible. Rest the joint as much as possible. If the affected joint is in your leg, you may be given crutches to use. Follow instructions from your health care provider about eating or drinking restrictions. Avoiding future gout attacks Follow a low-purine diet as told by your dietitian or health care provider. Avoid foods and drinks that are high in purines, including liver, kidney, anchovies, asparagus, herring, mushrooms, mussels, and beer. Maintain a healthy weight or lose weight if you are overweight. If you want to lose weight, talk with your health care provider. Do not lose weight too quickly. Start or maintain an exercise program as told by your health care provider. Eating and drinking Avoid drinking beverages that contain fructose. Drink enough fluids to keep your urine pale yellow. If you drink alcohol: Limit how much you have to: 0-1 drink a day for women who are not pregnant. 0-2 drinks a day for men. Know how much alcohol is in a drink. In the [place] one drink equals one 12 oz bottle of beer (355 mL), one 5 oz glass of wine (148 mL), or one 1 oz glass of hard liquor (44 mL). General instructions Take over-the-counter and prescription medicines only as told by your health care provider. Ask your health care provider if the medicine prescribed to you requires you to avoid driving or using machinery. Return to your normal activities as told by your health care provider. Ask your health care provider what activities are safe for you. Keep all follow-up visits. This is important. Where to find more information National Institutes of Health: www.niams.nih.gov Contact a health care provider if you have: Another  gout attack. Continuing symptoms of a gout attack after 10 days of treatment. Side effects from your medicines. Chills or a fever. Burning pain when you urinate. Pain in your lower back or abdomen. Get help right away if you: Have severe or uncontrolled pain. Cannot urinate. Summary Gout is painful swelling of the joints caused by having too much uric acid in the body. The most common site for gout to occur is in the big toe, but it can affect other joints in the body. Medicines and dietary changes can help to prevent and treat gout attacks. This information is not intended to replace advice given to you by your health care provider. Make sure you discuss any questions you have with your health care provider. Document Revised: 07/29/2021 Document Reviewed: 07/29/2021 Elsevier Patient Education  2024 [employer] Inc.  

## 2024-04-16 NOTE — Telephone Encounter (Signed)
 [place] [facility]

## 2024-04-16 NOTE — Telephone Encounter (Signed)
 On day 2 pt can take colchicine 0.6 mg BID until redness and pain resolves. Usually 3 days.

## 2024-04-16 NOTE — Progress Notes (Signed)
 Subjective:    Patient ID: Norma Barajas, female    DOB: 1941/03/30, 83 y.o.   MRN: [id number]  Chief Complaint  Patient presents with   Toe Pain    Right toe Red pain     Toe Pain    PT presents to the office today with recurrent right great toe pain that started Friday. Reports her pain is a 8 out 10. Has taken tylenol  with mild relief. She was seen on 02/17/24 with similar symptoms that resolved after steroid IM  Had a uric acid of 6.7 and was told it might not be gout.    Review of Systems  All other systems reviewed and are negative.   Social History   Socioeconomic History   Marital status: Widowed    Spouse name: george    Number of children: 1   Years of education: Not on file   Highest education level: Not on file  Occupational History   Occupation: Retired    Comment: Textile  Tobacco Use   Smoking status: Never   Smokeless tobacco: Never  Vaping Use   Vaping status: Never Used  Substance and Sexual Activity   Alcohol use: No    Alcohol/week: 0.0 standard drinks of alcohol   Drug use: No   Sexual activity: Not Currently    Birth control/protection: Surgical, Post-menopausal    Comment: hyst  Other Topics Concern   Not on file  Social History Narrative   Married   No regular exercise   Social Drivers of Health   Financial Resource Strain: Low Risk  (07/04/2023)   Overall Financial Resource Strain (CARDIA)    Difficulty of Paying Living Expenses: Not hard at all  Food Insecurity: No Food Insecurity (09/15/2023)   Hunger Vital Sign    Worried About Running Out of Food in the Last Year: Never true    Ran Out of Food in the Last Year: Never true  Transportation Needs: No Transportation Needs (09/15/2023)   PRAPARE - Administrator, Civil Service (Medical): No    Lack of Transportation (Non-Medical): No  Physical Activity: Unknown (07/04/2023)   Exercise Vital Sign    Days of Exercise per Week: 0 days    Minutes of Exercise per Session:  Not on file  Stress: Stress Concern Present (07/04/2023)   [employer] of Occupational Health - Occupational Stress Questionnaire    Feeling of Stress : To some extent  Social Connections: Moderately Isolated (07/04/2023)   Social Connection and Isolation Panel [NHANES]    Frequency of Communication with Friends and Family: More than three times a week    Frequency of Social Gatherings with Friends and Family: More than three times a week    Attends Religious Services: More than 4 times per year    Active Member of [employer] or Organizations: No    Attends Banker Meetings: Not on file    Marital Status: Widowed   Family History  Problem Relation Age of Onset   Colon cancer Mother 31   Heart disease Mother    Osteoporosis Mother    Hip fracture Mother    Stroke Sister    Diabetes Sister    Osteoporosis Sister    Arthritis Sister    Uterine cancer Sister    Stroke Sister    Heart disease Father    Hyperlipidemia Brother    Hypertension Brother    Heart disease Brother    Stroke Brother    Heart  disease Sister    Dementia Sister    Diabetes Son    Stroke Son    Heart attack Neg Hx         Objective:   Physical Exam Vitals reviewed.  Constitutional:      General: She is not in acute distress.    Appearance: She is well-developed.  HENT:     Head: Normocephalic and atraumatic.  Eyes:     Pupils: Pupils are equal, round, and reactive to light.  Neck:     Thyroid : No thyromegaly.  Cardiovascular:     Rate and Rhythm: Normal rate and regular rhythm.     Heart sounds: Normal heart sounds. No murmur heard. Pulmonary:     Effort: Pulmonary effort is normal. No respiratory distress.     Breath sounds: Normal breath sounds. No wheezing.  Abdominal:     General: Bowel sounds are normal. There is no distension.     Palpations: Abdomen is soft.     Tenderness: There is no abdominal tenderness.  Musculoskeletal:        General: Swelling and tenderness  present.     Cervical back: Normal range of motion and neck supple.  Skin:    General: Skin is warm and dry.  Neurological:     Mental Status: She is alert and oriented to person, place, and time.     Cranial Nerves: No cranial nerve deficit.     Deep Tendon Reflexes: Reflexes are normal and symmetric.  Psychiatric:        Behavior: Behavior normal.        Thought Content: Thought content normal.        Judgment: Judgment normal.       BP (!) 145/84   Pulse 77   Temp (!) 97.2 F (36.2 C) (Temporal)   Ht 4\' 8"  (1.422 m)   BMI 22.82 kg/m      Assessment & Plan:  TATUM CORL comes in today with chief complaint of Toe Pain (Right toe/Red pain )   Diagnosis and orders addressed:  1. Gout of big toe (Primary) - methylPREDNISolone  acetate (DEPO-MEDROL ) injection 80 mg - CMP14+EGFR - CBC with Differential/Platelet - Uric acid  2. Toe swelling - methylPREDNISolone  acetate (DEPO-MEDROL ) injection 80 mg - CMP14+EGFR - CBC with Differential/Platelet - Uric acid - colchicine 0.6 MG tablet; Take 1.2 mg then one hour later can take 0.6 mg. Max 1.8 mg/day  Dispense: 20 tablet; Refill: 1   Labs pending Based on exam definitely gout. Low purine diet  Force fluids Will give steroid IM today Start Colchicine today then d/c after toe pain improves  If gout flares up again will need to start allopurinol      Tommas Fragmin, FNP

## 2024-04-16 NOTE — Telephone Encounter (Signed)
 Copied from CRM [id number]. Topic: Clinical - Prescription Issue >> Apr 16, 2024 11:36 AM Baldemar Lev wrote: Reason for CRM: Needs specific directions. How many she can take a day. When she needs to stop the therapy.   Quantity of 20 tablets. Directions only include today. Please advise   Best contact: [phone] Jearlean Mince from [facility] [place]

## 2024-06-08 ENCOUNTER — Emergency Department (HOSPITAL_COMMUNITY)

## 2024-06-08 ENCOUNTER — Emergency Department (HOSPITAL_COMMUNITY)
Admission: EM | Admit: 2024-06-08 | Discharge: 2024-06-08 | Disposition: A | Discharge status: Home / Self Care | Source: Other Acute Inpatient Hospital | Attending: Emergency Medicine | Admitting: Emergency Medicine

## 2024-06-08 ENCOUNTER — Encounter (HOSPITAL_COMMUNITY): Payer: Self-pay | Admitting: Emergency Medicine

## 2024-06-08 ENCOUNTER — Other Ambulatory Visit: Payer: Self-pay

## 2024-06-08 DIAGNOSIS — I700 Atherosclerosis of aorta: Secondary | ICD-10-CM | POA: Diagnosis not present

## 2024-06-08 DIAGNOSIS — M25461 Effusion, right knee: Secondary | ICD-10-CM | POA: Diagnosis not present

## 2024-06-08 DIAGNOSIS — Z7982 Long term (current) use of aspirin: Secondary | ICD-10-CM | POA: Insufficient documentation

## 2024-06-08 DIAGNOSIS — R0682 Tachypnea, not elsewhere classified: Secondary | ICD-10-CM | POA: Diagnosis not present

## 2024-06-08 DIAGNOSIS — R718 Other abnormality of red blood cells: Secondary | ICD-10-CM | POA: Insufficient documentation

## 2024-06-08 DIAGNOSIS — M25551 Pain in right hip: Secondary | ICD-10-CM | POA: Insufficient documentation

## 2024-06-08 DIAGNOSIS — M160 Bilateral primary osteoarthritis of hip: Secondary | ICD-10-CM | POA: Diagnosis not present

## 2024-06-08 DIAGNOSIS — M542 Cervicalgia: Secondary | ICD-10-CM | POA: Diagnosis not present

## 2024-06-08 DIAGNOSIS — R531 Weakness: Secondary | ICD-10-CM | POA: Diagnosis not present

## 2024-06-08 DIAGNOSIS — W050XXA Fall from non-moving wheelchair, initial encounter: Secondary | ICD-10-CM | POA: Insufficient documentation

## 2024-06-08 DIAGNOSIS — Z043 Encounter for examination and observation following other accident: Secondary | ICD-10-CM | POA: Diagnosis not present

## 2024-06-08 DIAGNOSIS — I6529 Occlusion and stenosis of unspecified carotid artery: Secondary | ICD-10-CM | POA: Diagnosis not present

## 2024-06-08 DIAGNOSIS — I10 Essential (primary) hypertension: Secondary | ICD-10-CM | POA: Diagnosis not present

## 2024-06-08 DIAGNOSIS — R9082 White matter disease, unspecified: Secondary | ICD-10-CM | POA: Insufficient documentation

## 2024-06-08 DIAGNOSIS — R1032 Left lower quadrant pain: Secondary | ICD-10-CM | POA: Diagnosis not present

## 2024-06-08 DIAGNOSIS — R7309 Other abnormal glucose: Secondary | ICD-10-CM | POA: Insufficient documentation

## 2024-06-08 DIAGNOSIS — W19XXXA Unspecified fall, initial encounter: Secondary | ICD-10-CM | POA: Diagnosis not present

## 2024-06-08 DIAGNOSIS — R519 Headache, unspecified: Secondary | ICD-10-CM | POA: Diagnosis not present

## 2024-06-08 DIAGNOSIS — M85861 Other specified disorders of bone density and structure, right lower leg: Secondary | ICD-10-CM | POA: Diagnosis not present

## 2024-06-08 DIAGNOSIS — Z90710 Acquired absence of both cervix and uterus: Secondary | ICD-10-CM | POA: Diagnosis not present

## 2024-06-08 DIAGNOSIS — G9389 Other specified disorders of brain: Secondary | ICD-10-CM | POA: Diagnosis not present

## 2024-06-08 DIAGNOSIS — M8580 Other specified disorders of bone density and structure, unspecified site: Secondary | ICD-10-CM | POA: Diagnosis not present

## 2024-06-08 LAB — CBC
HCT: 45.7 % (ref 36.0–46.0)
Hemoglobin: 15.7 g/dL — ABNORMAL HIGH (ref 12.0–15.0)
MCH: 33.8 pg (ref 26.0–34.0)
MCHC: 34.4 g/dL (ref 30.0–36.0)
MCV: 98.5 fL (ref 80.0–100.0)
Platelets: 140 K/uL — ABNORMAL LOW (ref 150–400)
RBC: 4.64 MIL/uL (ref 3.87–5.11)
RDW: 14.6 % (ref 11.5–15.5)
WBC: 10.3 K/uL (ref 4.0–10.5)
nRBC: 0 % (ref 0.0–0.2)

## 2024-06-08 LAB — BASIC METABOLIC PANEL WITH GFR
Anion gap: 14 (ref 5–15)
BUN: 20 mg/dL (ref 8–23)
CO2: 26 mmol/L (ref 22–32)
Calcium: 8.7 mg/dL — ABNORMAL LOW (ref 8.9–10.3)
Chloride: 97 mmol/L — ABNORMAL LOW (ref 98–111)
Creatinine, Ser: 0.95 mg/dL (ref 0.44–1.00)
GFR, Estimated: 59 mL/min — ABNORMAL LOW (ref >60)
Glucose, Bld: 181 mg/dL — ABNORMAL HIGH (ref 70–99)
Potassium: 4.1 mmol/L (ref 3.5–5.1)
Sodium: 137 mmol/L (ref 135–145)

## 2024-06-08 MED ORDER — NAPROXEN 375 MG PO TABS: 375.0000 mg | ORAL_TABLET | Freq: Two times a day (BID) | ORAL | 0 refills | Status: DC

## 2024-06-08 MED ORDER — TRAMADOL HCL 50 MG PO TABS: 50.0000 mg | ORAL_TABLET | Freq: Once | ORAL | Status: DC

## 2024-06-08 MED ORDER — KETOROLAC TROMETHAMINE 30 MG/ML IJ SOLN
15.0000 mg | Freq: Once | INTRAMUSCULAR | Status: AC
  Administered 2024-06-08: 15 mg via INTRAVENOUS
  Filled 2024-06-08: qty 1

## 2024-06-08 MED ORDER — FENTANYL CITRATE (PF) 100 MCG/2ML IJ SOLN: 50.0000 ug | Freq: Once | INTRAMUSCULAR | Status: DC

## 2024-06-08 MED ORDER — ONDANSETRON HCL 4 MG/2ML IJ SOLN: 4.0000 mg | Freq: Once | INTRAMUSCULAR | Status: DC

## 2024-06-08 NOTE — Discharge Instructions (Addendum)
 Your CT imaging today is negative for acute fractures.  I suspect your pain is secondary to deep bruising.  Continue taking your home Tylenol  every 8 hours. In addition, I recommend naproxen for the next week which has been prescribed.   Your chest xray is ok today, but you may need further tests regarding your breathing - since you do not feel short of breath,  I don't strongly feel you need to be admitted today, but I do recommend you return for any worsened breathing symptoms.    I recommend follow up with your primary MD -call for an appointment.

## 2024-06-08 NOTE — ED Provider Notes (Addendum)
 [place] EMERGENCY DEPARTMENT AT [facility] Provider Note   CSN: [id number] Arrival date & time: 06/08/24  9082     Patient presents with: [place]   Norma Barajas is a 83 y.o. female with a history including CHF, hypertension CAD, GERD history of right i intertrochanteric fracture in 2017 with IM nail procedure presenting today from her assisted living after sustaining a fall.  She usually uses a wheelchair but attempted to walk independently to pick up her phone sitting next to her TV when her right leg gave out and she fell on her right hip.  She denies hitting her head but does endorse neck pain since the fall.  She has severe pain with any movement of the right hip.  She denies LOC, nausea or vomiting, headache or dizziness either prior or since today's event.  She has taken Tylenol  prior to arrival with no improvement in her pain symptoms.   The history is provided by the patient.       Prior to Admission medications   Medication Sig Start Date End Date Taking? Authorizing Provider  naproxen (NAPROSYN) 375 MG tablet Take 1 tablet (375 mg total) by mouth 2 (two) times daily. 06/08/24  Yes Ezreal Turay, PA-C  acetaminophen  (TYLENOL  8 HOUR) 650 MG CR tablet Take 1 tablet (650 mg total) by mouth every 8 (eight) hours as needed for pain. 12/19/23   Jolinda Potter M, DO  ASPIRIN  LOW DOSE 81 MG tablet TAKE 1 TABLET TWICE DAILY. SWALLOW WHOLE 03/09/24   Rakes, Rock HERO, FNP  bisacodyl  (DULCOLAX) 10 MG suppository Place 1 suppository (10 mg total) rectally as needed for moderate constipation. 07/06/23   Severa Rock HERO, FNP  cholecalciferol  (VITAMIN D3) 25 MCG (1000 UNIT) tablet Take 1,000 Units by mouth in the morning.    [provider]  colchicine  0.6 MG tablet Take 1.2 mg then one hour later can take 0.6 mg. Max 1.8 mg/day 04/16/24   Lavell Lye A, FNP  cyanocobalamin  (VITAMIN B12) 1000 MCG tablet Take 1,000 mcg by mouth 4 (four) times a week.    [provider]   diltiazem  (CARDIZEM  CD) 240 MG 24 hr capsule Take 1 capsule (240 mg total) by mouth daily. 03/01/24   Severa Rock HERO, FNP  fluticasone  (FLONASE ) 50 MCG/ACT nasal spray Place 2 sprays into both nostrils daily. 11/24/23   [place] Sebastian Pool, NP  meclizine  (ANTIVERT ) 25 MG tablet Take 1 tablet (25 mg total) by mouth 3 (three) times daily as needed for dizziness. 01/04/24   Severa Rock HERO, FNP  methocarbamol  (ROBAXIN ) 500 MG tablet Take 1 tablet (500 mg total) by mouth every 8 (eight) hours as needed for muscle spasms. 09/18/23   Ricky Fines, MD  metoprolol  succinate (TOPROL -XL) 100 MG 24 hr tablet Take 1 tablet (100 mg total) by mouth 2 (two) times daily. Take with or immediately following a meal. 03/01/24   Rakes, Rock HERO, FNP  midodrine  (PROAMATINE ) 2.5 MG tablet Take 1 tablet (2.5 mg total) by mouth 2 (two) times daily with a meal. 03/01/24   Rakes, Rock HERO, FNP  omeprazole  (PRILOSEC) 20 MG capsule Take 1 capsule (20 mg total) by mouth daily. 03/01/24   Severa Rock HERO, FNP  ondansetron  (ZOFRAN ) 4 MG tablet Take 1 tablet (4 mg total) by mouth every 8 (eight) hours as needed for nausea or vomiting. 01/04/24   Rakes, Rock HERO, FNP  polyethylene glycol (MIRALAX  / GLYCOLAX ) 17 g packet Take 17 g by mouth  2 (two) times daily as needed for mild constipation. 08/10/23   Von Bellis, MD  potassium chloride  (KLOR-CON  M) 10 MEQ tablet TAKE 1 TABLET EVERY DAY (TAKE WHILE TAKING DEMADEX /TORSEMIDE  AS DIRECTED) 03/06/24   Severa Rock HERO, FNP  potassium chloride  (KLOR-CON ) 10 MEQ tablet Take 1 tablet (10 mEq total) by mouth daily. Take While taking Demadex /Torsemide  10/11/23   Severa Rock HERO, FNP  simvastatin  (ZOCOR ) 20 MG tablet Take 1 tablet (20 mg total) by mouth daily. 03/01/24   Severa Rock HERO, FNP  torsemide  (DEMADEX ) 20 MG tablet Take 1 tablet (20 mg total) by mouth daily. Breakfast and lunch 03/01/24   Severa Rock HERO, FNP  traZODone  (DESYREL ) 50 MG tablet Take 1 tablet (50 mg total) by mouth at bedtime as  needed for sleep. 03/01/24   Severa Rock HERO, FNP    Allergies: Codeine, Doxycycline , Triamterene-hctz, Atorvastatin, Crestor [rosuvastatin calcium ], Morphine , and Risedronate sodium    Review of Systems  Constitutional:  Negative for chills and fever.  HENT:  Negative for congestion.   Eyes: Negative.   Respiratory:  Negative for chest tightness and shortness of breath.   Cardiovascular:  Negative for chest pain.  Gastrointestinal:  Negative for abdominal pain, nausea and vomiting.  Genitourinary: Negative.   Musculoskeletal:  Positive for arthralgias and neck pain. Negative for joint swelling.  Skin: Negative.  Negative for rash and wound.  Neurological:  Negative for dizziness, weakness, light-headedness, numbness and headaches.  Psychiatric/Behavioral: Negative.      Updated Vital Signs BP 115/78   Pulse 91   Temp 97.7 F (36.5 C) (Oral)   Resp (!) 29   Ht 4' 8 (1.422 m)   Wt 45.8 kg   SpO2 (!) 87%   BMI 22.64 kg/m   Physical Exam Vitals and nursing note reviewed.  Constitutional:      Appearance: She is well-developed.  HENT:     Head: Normocephalic and atraumatic.  Eyes:     Conjunctiva/sclera: Conjunctivae normal.  Cardiovascular:     Rate and Rhythm: Normal rate and regular rhythm.     Heart sounds: Normal heart sounds.  Pulmonary:     Effort: Pulmonary effort is normal.     Breath sounds: Normal breath sounds. No decreased breath sounds, wheezing, rhonchi or rales.  Abdominal:     General: Bowel sounds are normal.     Palpations: Abdomen is soft.     Tenderness: There is no abdominal tenderness. There is no guarding.  Musculoskeletal:        General: Normal range of motion.     Cervical back: Normal range of motion. Tenderness present.       Legs:     Comments: Ttp paracervical neck, no palpable deformity.  Pain right groin with deep palpation and active/passive ROM of the right leg.  No leg shortening or external rotation.  Skin:    General: Skin is  warm and dry.  Neurological:     Mental Status: She is alert.     (all labs ordered are listed, but only abnormal results are displayed) Labs Reviewed  BASIC METABOLIC PANEL WITH GFR - Abnormal; Notable for the following components:      Result Value   Chloride 97 (*)    Glucose, Bld 181 (*)    Calcium  8.7 (*)    GFR, Estimated 59 (*)    All other components within normal limits  CBC - Abnormal; Notable for the following components:   Hemoglobin 15.7 (*)  Platelets 140 (*)    All other components within normal limits    EKG: None  Radiology: DG Chest Portable 1 View Result Date: 06/08/2024 CLINICAL DATA:  Unwitnessed fall, tachypnea. EXAM: PORTABLE CHEST 1 VIEW COMPARISON:  November 17, 2023. FINDINGS: Stable cardiomediastinal silhouette. Minimal bibasilar subsegmental atelectasis or scarring is noted. Bony thorax is unremarkable. IMPRESSION: Minimal bibasilar subsegmental atelectasis or scarring. Electronically Signed   By: Lynwood Landy Raddle M.D.   On: 06/08/2024 14:00   CT PELVIS WO CONTRAST Result Date: 06/08/2024 CLINICAL DATA:  Unwitnessed fall.  Left groin pain. EXAM: CT PELVIS WITHOUT CONTRAST TECHNIQUE: Multidetector CT imaging of the pelvis was performed following the standard protocol without intravenous contrast. RADIATION DOSE REDUCTION: This exam was performed according to the departmental dose-optimization program which includes automated exposure control, adjustment of the mA and/or kV according to patient size and/or use of iterative reconstruction technique. COMPARISON:  September 15, 2023. FINDINGS: Urinary Tract:  Urinary bladder is unremarkable. Bowel:  Unremarkable visualized pelvic bowel loops. Vascular/Lymphatic: Aortic atherosclerosis.  No adenopathy. Reproductive:  Status post hysterectomy. Other:  Minimal cholelithiasis.  No definite ascites or hernia. Musculoskeletal: Old bilateral pubic rami fractures are noted. Status post surgical internal fixation of old proximal  right femoral fracture. No acute fracture or dislocation is noted. IMPRESSION: No acute abnormality seen.  Stable chronic findings as noted above. Aortic Atherosclerosis (ICD10-I70.0). Electronically Signed   By: Lynwood Landy Raddle M.D.   On: 06/08/2024 12:24   DG Knee Complete 4 Views Right Result Date: 06/08/2024 CLINICAL DATA:  Fall. EXAM: RIGHT KNEE - COMPLETE 4+ VIEW COMPARISON:  None Available. FINDINGS: Osseous structures are osteopenic. Status post ORIF with plate and screw fixation of the tibia and medullary rod the femur. Joint spaces are maintained. There is a small suprapatellar effusion. No acute fracture. IMPRESSION: Osteopenia. Small effusion. No acute osseous abnormalities. Electronically Signed   By: Fonda Field M.D.   On: 06/08/2024 11:21   DG Hip Unilat W or Wo Pelvis 2-3 Views Right Result Date: 06/08/2024 CLINICAL DATA:  Fall. EXAM: DG HIP (WITH OR WITHOUT PELVIS) 2-3V RIGHT COMPARISON:  None Available. FINDINGS: There is probable acute fracture of the right inferior pubic ramus, marked with electronic arrow sign on image 1. Previously seen old healed fractures of left superior pubic ramus and bilateral inferior pubic rami are again seen. No other acute fracture or dislocation. No aggressive osseous lesion. Visualized sacral arcuate lines are unremarkable. Unremarkable symphysis pubis. There are mild degenerative changes of bilateral hip joints characterized by mildly reduced joint space and osteophytosis of the superior acetabulum. Stable appearance of right proximal femur hardware. No radiopaque foreign bodies. IMPRESSION: Probable acute fracture of the right inferior pubic ramus. Electronically Signed   By: Ree Molt M.D.   On: 06/08/2024 10:46   CT Cervical Spine Wo Contrast Result Date: 06/08/2024 CLINICAL DATA:  83 year old female status post unwitnessed fall this morning. Pain. EXAM: CT CERVICAL SPINE WITHOUT CONTRAST TECHNIQUE: Multidetector CT imaging of the cervical spine  was performed without intravenous contrast. Multiplanar CT image reconstructions were also generated. RADIATION DOSE REDUCTION: This exam was performed according to the departmental dose-optimization program which includes automated exposure control, adjustment of the mA and/or kV according to patient size and/or use of iterative reconstruction technique. COMPARISON:  Head CT today. FINDINGS: Alignment: Exaggerated cervical lordosis. Cervicothoracic junction alignment is within normal limits. Bilateral posterior element alignment is within normal limits. Skull base and vertebrae: Diffuse osteopenia.  Mild motion artifact. Visualized skull base  is intact. No atlanto-occipital dissociation. C1 and C2 appear intact and aligned. No acute osseous abnormality identified. Soft tissues and spinal canal: No prevertebral fluid or swelling. No visible canal hematoma. Negative visible noncontrast neck soft tissues aside from calcified carotid atherosclerosis. Disc levels: Pronounced osteopenia but mild for age cervical spine degeneration. Upper chest: Osteopenia continues into the visible upper thoracic levels. Negative lung apices. Partially visible aortic arch calcified atherosclerosis. IMPRESSION: Osteopenia. No acute traumatic injury identified in the cervical spine. Electronically Signed   By: VEAR Hurst M.D.   On: 06/08/2024 10:21   CT Head Wo Contrast Result Date: 06/08/2024 CLINICAL DATA:  83 year old female status post unwitnessed fall this morning. Pain. EXAM: CT HEAD WITHOUT CONTRAST TECHNIQUE: Contiguous axial images were obtained from the base of the skull through the vertex without intravenous contrast. RADIATION DOSE REDUCTION: This exam was performed according to the departmental dose-optimization program which includes automated exposure control, adjustment of the mA and/or kV according to patient size and/or use of iterative reconstruction technique. COMPARISON:  Brain MRI 07/01/2023.  Head CT 04/13/2016.  FINDINGS: Brain: Chronic dystrophic calcification in the left occipital lobe stable since 2017. No midline shift, ventriculomegaly, mass effect, evidence of mass lesion, intracranial hemorrhage or evidence of cortically based acute infarction. Mild for age periventricular white matter hypodensity, stable from MRI last year. Vascular: Calcified atherosclerosis at the skull base. No suspicious intracranial vascular hyperdensity. Skull: Osteopenia. Stable since 2017. No acute osseous abnormality identified. Sinuses/Orbits: Visualized paranasal sinuses and mastoids are stable and well aerated. Other: No orbit or scalp acute soft tissue injury identified. Scalp venous varices similar to the MRI last year. IMPRESSION: 1. No acute intracranial abnormality or acute traumatic injury identified. 2. 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 Electronically Signed   By: VEAR Hurst M.D.   On: 06/08/2024 10:19     Procedures   Medications Ordered in the ED  ondansetron  (ZOFRAN ) injection 4 mg (4 mg Intravenous Not Given 06/08/24 1029)  ketorolac  (TORADOL ) 30 MG/ML injection 15 mg (15 mg Intravenous Given 06/08/24 1143)                                    Medical Decision Making Patient presenting with a fall at her assisted living facility, she endorses she typically is in a wheelchair but attempted to walk from her chair to her TV stand, reporting her right leg gave out on her and she fell on the right hip.  Severe pain since, exam suggest right hip fracture but plain film imaging is negative although it does raise concern of possible acute fracture of the right inferior pubic ramus.  CT imaging through her pelvis was completed and CT is negative for acute fracture but does reveal old healed pubic rami fractures.  Her hardware is intact in the right femur and hip.  She is strongly encouraged to use her wheelchair at all times to avoid further falls.  Other imaging including CT head  and C-spine, right knee also unremarkable for acute findings.  Of note, pt had several documented vs with tachypnea - not tachypneic on my exam and she denies sob or cp.  She does state she gets winded when rolling herself to the dining room at her facility, states this has been happening for a long time. Discussed admission with pt but she declines.  Chest xray ordered, if clear, will plan close f/u  with pcp.   Amount and/or Complexity of Data Reviewed Labs: ordered.    Details: Labs reviewed including CBC, B met, both are stable, she does have a glucose today 181, no anion gap.  Her WBC count is normal at 10.3, hemoglobin 15.7. Radiology: ordered.    Details: Imaging per above.  Reviewed and agree with interpretations.  Chest x-ray added prior to discharge with no acute findings.  Risk Prescription drug management.  Acute fracture of the right inferior pubis ramus      Final diagnoses:  Fall, initial encounter  Acute right hip pain    ED Discharge Orders          Ordered    naproxen (NAPROSYN) 375 MG tablet  2 times daily        06/08/24 1309               Laquincy Eastridge, PA-C 06/08/24 1311    Jocelyn Lowery, PA-C 06/08/24 1432    Patsey Lot, MD 06/08/24 1446

## 2024-06-08 NOTE — ED Notes (Signed)
 Placed  patient back on 2L of supplement o2 due ot o2 saturation being 87% on RA. PA Idol aware.

## 2024-06-08 NOTE — ED Triage Notes (Signed)
 Pt had a mechanical fall around 0830 this morning. Fall was unwitnessed. No LOC, no head strike, denies thinners.  Pt states that she fell on her left hip.  Pt c/o left groin pain, denies numbness or tingling in leg.   Pt has broken left hip before.

## 2024-06-12 ENCOUNTER — Observation Stay (HOSPITAL_COMMUNITY)
Admission: EM | Admit: 2024-06-12 | Discharge: 2024-06-14 | Disposition: A | Discharge status: Home / Self Care | Attending: Family Medicine | Admitting: Family Medicine

## 2024-06-12 ENCOUNTER — Encounter: Payer: Self-pay | Admitting: Family Medicine

## 2024-06-12 ENCOUNTER — Emergency Department (HOSPITAL_COMMUNITY)

## 2024-06-12 ENCOUNTER — Other Ambulatory Visit: Payer: Self-pay

## 2024-06-12 ENCOUNTER — Ambulatory Visit: Admitting: Family Medicine

## 2024-06-12 VITALS — BP 112/58 | HR 62 | Temp 97.0°F | Ht <= 58 in

## 2024-06-12 DIAGNOSIS — N179 Acute kidney failure, unspecified: Principal | ICD-10-CM | POA: Diagnosis present

## 2024-06-12 DIAGNOSIS — W19XXXD Unspecified fall, subsequent encounter: Secondary | ICD-10-CM

## 2024-06-12 DIAGNOSIS — I5082 Biventricular heart failure: Secondary | ICD-10-CM | POA: Diagnosis not present

## 2024-06-12 DIAGNOSIS — R069 Unspecified abnormalities of breathing: Secondary | ICD-10-CM | POA: Diagnosis not present

## 2024-06-12 DIAGNOSIS — I50812 Chronic right heart failure: Secondary | ICD-10-CM | POA: Diagnosis not present

## 2024-06-12 DIAGNOSIS — R296 Repeated falls: Secondary | ICD-10-CM | POA: Diagnosis present

## 2024-06-12 DIAGNOSIS — R0602 Shortness of breath: Secondary | ICD-10-CM | POA: Diagnosis not present

## 2024-06-12 DIAGNOSIS — I5032 Chronic diastolic (congestive) heart failure: Secondary | ICD-10-CM

## 2024-06-12 DIAGNOSIS — E875 Hyperkalemia: Secondary | ICD-10-CM | POA: Insufficient documentation

## 2024-06-12 DIAGNOSIS — R531 Weakness: Secondary | ICD-10-CM | POA: Diagnosis not present

## 2024-06-12 DIAGNOSIS — R4182 Altered mental status, unspecified: Secondary | ICD-10-CM

## 2024-06-12 DIAGNOSIS — I480 Paroxysmal atrial fibrillation: Secondary | ICD-10-CM | POA: Diagnosis not present

## 2024-06-12 DIAGNOSIS — I517 Cardiomegaly: Secondary | ICD-10-CM | POA: Diagnosis not present

## 2024-06-12 DIAGNOSIS — R9082 White matter disease, unspecified: Secondary | ICD-10-CM | POA: Diagnosis not present

## 2024-06-12 DIAGNOSIS — Z7982 Long term (current) use of aspirin: Secondary | ICD-10-CM | POA: Insufficient documentation

## 2024-06-12 DIAGNOSIS — E871 Hypo-osmolality and hyponatremia: Secondary | ICD-10-CM | POA: Diagnosis not present

## 2024-06-12 DIAGNOSIS — G9389 Other specified disorders of brain: Secondary | ICD-10-CM | POA: Diagnosis not present

## 2024-06-12 DIAGNOSIS — M6281 Muscle weakness (generalized): Secondary | ICD-10-CM | POA: Insufficient documentation

## 2024-06-12 DIAGNOSIS — R410 Disorientation, unspecified: Secondary | ICD-10-CM | POA: Diagnosis not present

## 2024-06-12 DIAGNOSIS — I4891 Unspecified atrial fibrillation: Secondary | ICD-10-CM | POA: Diagnosis not present

## 2024-06-12 DIAGNOSIS — G9341 Metabolic encephalopathy: Secondary | ICD-10-CM | POA: Diagnosis not present

## 2024-06-12 DIAGNOSIS — I110 Hypertensive heart disease with heart failure: Secondary | ICD-10-CM | POA: Diagnosis not present

## 2024-06-12 DIAGNOSIS — S0990XA Unspecified injury of head, initial encounter: Secondary | ICD-10-CM | POA: Diagnosis not present

## 2024-06-12 DIAGNOSIS — R2681 Unsteadiness on feet: Secondary | ICD-10-CM | POA: Insufficient documentation

## 2024-06-12 DIAGNOSIS — I10 Essential (primary) hypertension: Secondary | ICD-10-CM | POA: Diagnosis not present

## 2024-06-12 DIAGNOSIS — R400 Somnolence: Secondary | ICD-10-CM | POA: Diagnosis not present

## 2024-06-12 DIAGNOSIS — W19XXXA Unspecified fall, initial encounter: Secondary | ICD-10-CM | POA: Diagnosis not present

## 2024-06-12 LAB — CBC WITH DIFFERENTIAL/PLATELET
Abs Granulocyte: 7 K/uL — ABNORMAL HIGH (ref 1.5–6.5)
Abs Immature Granulocytes: 0.1 K/uL — ABNORMAL HIGH (ref 0.00–0.07)
Basophils Absolute: 0 K/uL (ref 0.0–0.1)
Basophils Relative: 0 %
Eosinophils Absolute: 0 K/uL (ref 0.0–0.5)
Eosinophils Relative: 0 %
HCT: 40.2 % (ref 36.0–46.0)
Hemoglobin: 14.1 g/dL (ref 12.0–15.0)
Immature Granulocytes: 1 %
Lymphocytes Relative: 8 %
Lymphs Abs: 0.7 K/uL (ref 0.7–4.0)
MCH: 33.7 pg (ref 26.0–34.0)
MCHC: 35.1 g/dL (ref 30.0–36.0)
MCV: 96.2 fL (ref 80.0–100.0)
Monocytes Absolute: 1 K/uL (ref 0.1–1.0)
Monocytes Relative: 12 %
Neutro Abs: 7 K/uL (ref 1.7–7.7)
Neutrophils Relative %: 79 %
Platelets: 112 K/uL — ABNORMAL LOW (ref 150–400)
RBC: 4.18 MIL/uL (ref 3.87–5.11)
RDW: 15.2 % (ref 11.5–15.5)
WBC: 8.8 K/uL (ref 4.0–10.5)
nRBC: 0.9 % — ABNORMAL HIGH (ref 0.0–0.2)

## 2024-06-12 LAB — COMPREHENSIVE METABOLIC PANEL WITH GFR
ALT: 64 U/L — ABNORMAL HIGH (ref 0–44)
AST: 65 U/L — ABNORMAL HIGH (ref 15–41)
Albumin: 3.2 g/dL — ABNORMAL LOW (ref 3.5–5.0)
Alkaline Phosphatase: 77 U/L (ref 38–126)
Anion gap: 17 — ABNORMAL HIGH (ref 5–15)
BUN: 76 mg/dL — ABNORMAL HIGH (ref 8–23)
CO2: 18 mmol/L — ABNORMAL LOW (ref 22–32)
Calcium: 7.9 mg/dL — ABNORMAL LOW (ref 8.9–10.3)
Chloride: 93 mmol/L — ABNORMAL LOW (ref 98–111)
Creatinine, Ser: 2.52 mg/dL — ABNORMAL HIGH (ref 0.44–1.00)
GFR, Estimated: 18 mL/min — ABNORMAL LOW (ref >60)
Glucose, Bld: 106 mg/dL — ABNORMAL HIGH (ref 70–99)
Potassium: 5.7 mmol/L — ABNORMAL HIGH (ref 3.5–5.1)
Sodium: 128 mmol/L — ABNORMAL LOW (ref 135–145)
Total Bilirubin: 1.8 mg/dL — ABNORMAL HIGH (ref 0.0–1.2)
Total Protein: 6.3 g/dL — ABNORMAL LOW (ref 6.5–8.1)

## 2024-06-12 LAB — URINALYSIS, ROUTINE W REFLEX MICROSCOPIC
Bilirubin Urine: NEGATIVE
Glucose, UA: NEGATIVE mg/dL
Hgb urine dipstick: NEGATIVE
Ketones, ur: NEGATIVE mg/dL
Leukocytes,Ua: NEGATIVE
Nitrite: NEGATIVE
Protein, ur: NEGATIVE mg/dL
Specific Gravity, Urine: 1.017 (ref 1.005–1.030)
pH: 5 (ref 5.0–8.0)

## 2024-06-12 LAB — GLUCOSE HEMOCUE WAIVED: Glu Hemocue Waived: 121 mg/dL — ABNORMAL HIGH (ref 70–99)

## 2024-06-12 MED ORDER — ACETAMINOPHEN 650 MG RE SUPP: 650.0000 mg | Freq: Four times a day (QID) | RECTAL | Status: DC | PRN

## 2024-06-12 MED ORDER — HYDROCODONE-ACETAMINOPHEN 5-325 MG PO TABS
1.0000 | ORAL_TABLET | Freq: Four times a day (QID) | ORAL | Status: DC | PRN
  Administered 2024-06-13 – 2024-06-14 (×2): 1 via ORAL
  Filled 2024-06-12 (×3): qty 1

## 2024-06-12 MED ORDER — ASPIRIN 81 MG PO TBEC
81.0000 mg | DELAYED_RELEASE_TABLET | Freq: Every day | ORAL | Status: DC
Start: 2024-06-13 — End: 2024-06-14
  Administered 2024-06-13 – 2024-06-14 (×2): 81 mg via ORAL
  Filled 2024-06-12 (×2): qty 1

## 2024-06-12 MED ORDER — ONDANSETRON HCL 4 MG PO TABS: 4.0000 mg | ORAL_TABLET | Freq: Four times a day (QID) | ORAL | Status: DC | PRN

## 2024-06-12 MED ORDER — ONDANSETRON HCL 4 MG/2ML IJ SOLN: 4.0000 mg | Freq: Four times a day (QID) | INTRAMUSCULAR | Status: DC | PRN

## 2024-06-12 MED ORDER — POLYETHYLENE GLYCOL 3350 17 G PO PACK: 17.0000 g | PACK | Freq: Every day | ORAL | Status: DC | PRN

## 2024-06-12 MED ORDER — HEPARIN SODIUM (PORCINE) 5000 UNIT/ML IJ SOLN
5000.0000 [IU] | Freq: Three times a day (TID) | INTRAMUSCULAR | Status: DC
  Administered 2024-06-12 – 2024-06-14 (×5): 5000 [IU] via SUBCUTANEOUS
  Filled 2024-06-12 (×5): qty 1

## 2024-06-12 MED ORDER — METOPROLOL SUCCINATE ER 50 MG PO TB24
100.0000 mg | ORAL_TABLET | Freq: Two times a day (BID) | ORAL | Status: DC
  Administered 2024-06-13 – 2024-06-14 (×3): 100 mg via ORAL
  Filled 2024-06-12 (×3): qty 2

## 2024-06-12 MED ORDER — MIDODRINE HCL 5 MG PO TABS
2.5000 mg | ORAL_TABLET | Freq: Two times a day (BID) | ORAL | Status: DC
  Administered 2024-06-13 – 2024-06-14 (×3): 2.5 mg via ORAL
  Filled 2024-06-12 (×3): qty 1

## 2024-06-12 MED ORDER — TRAZODONE HCL 50 MG PO TABS
50.0000 mg | ORAL_TABLET | Freq: Every evening | ORAL | Status: DC | PRN
  Administered 2024-06-13: 50 mg via ORAL
  Filled 2024-06-12: qty 1

## 2024-06-12 MED ORDER — SODIUM CHLORIDE 0.9 % IV BOLUS
1000.0000 mL | Freq: Once | INTRAVENOUS | Status: AC
  Administered 2024-06-12: 1000 mL via INTRAVENOUS

## 2024-06-12 MED ORDER — ACETAMINOPHEN 325 MG PO TABS
650.0000 mg | ORAL_TABLET | Freq: Four times a day (QID) | ORAL | Status: DC | PRN
  Administered 2024-06-13 (×2): 650 mg via ORAL
  Filled 2024-06-12 (×2): qty 2

## 2024-06-12 MED ORDER — ENSURE PLUS HIGH PROTEIN PO LIQD
237.0000 mL | Freq: Two times a day (BID) | ORAL | Status: DC
  Administered 2024-06-13 – 2024-06-14 (×4): 237 mL via ORAL

## 2024-06-12 MED ORDER — DILTIAZEM HCL ER COATED BEADS 240 MG PO CP24
240.0000 mg | ORAL_CAPSULE | Freq: Every day | ORAL | Status: DC
Start: 2024-06-13 — End: 2024-06-14
  Administered 2024-06-13 – 2024-06-14 (×2): 240 mg via ORAL
  Filled 2024-06-12 (×2): qty 1

## 2024-06-12 MED ORDER — SODIUM CHLORIDE 0.9 % IV SOLN: INTRAVENOUS | Status: AC

## 2024-06-12 NOTE — Assessment & Plan Note (Signed)
 Creatinine 2.52, baseline 0.8-0.9.  Likely due to dehydration from poor oral intake over the past few days.  Also on torsemide  20 mg BID.

## 2024-06-12 NOTE — H&P (Addendum)
 History and Physical    Norma Barajas FMW:[id number] DOB: 1941-05-29 DOA: 06/12/2024  PCP: Severa Rock HERO, FNP   Patient coming from: ALF- [facility]  I have personally briefly reviewed patient's old medical records in [facility]  Chief Complaint: Confusion, lethargy  HPI: Norma Barajas is a 83 y.o. female with medical history significant for paroxysmal atrial fibrillation, CHF, coronary artery disease, hypertension. Patient was brought to the ED from ALF for reports of confused speech, lethargy and poor oral intake.    Patient had a mechanical fall on Friday 8/1-she uses a wheelchair at baseline, but she attempted to walk independently to pick up her phone sitting next to her TV when her leg gave out and she fell onto her right hip. She came to the ED imaging included pelvic x-ray that suggested probable acute right inferior pubic rami fracture, subsequent CT pelvis was negative for fractures.  She also had head CT, cervical CT, right knee x-ray, chest x-ray unremarkable.  She was discharged back to the ALF.  But since getting back, she has been complaining of pain, she stopped eating or drinking, and today she was lethargic and confused.    ED Course: Temperature 97.7.  Heart rate 111.  Respiratory 26.  Blood pressure 170/82.  O2 sats greater than 92% on room air. Creatinine elevated 2.52.  Sodium 128.   UA not suggestive of UTI.  Chest x-ray and head CT negative for acute abnormality. 2 L bolus given.  Hospitalist admit for AKI.  Review of Systems: As per HPI all other systems reviewed and negative.  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 Nonobstructive minimal CAD 10/2005.   Depression    Diastolic dysfunction    Grade 1. Ejection fraction 60-65%.    Dysrhythmia    a fib   Erosive esophagitis    Essential hypertension    Fracture of ramus of right pubis with routine healing 12/09/2020   GERD (gastroesophageal reflux disease)    Grade III hemorrhoids    History of hiatal hernia    History of prediabetes    Hypercholesterolemia    Internal hemorrhoids with complication 07/29/2008   OCT 2015 FLEX SIG/IH BANDING     Intertrochanteric fracture of femur (HCC)    5/16 - 03/26/2021 fracture sustained in a mechanical fall.  IM nailing performed 5/17 by Dr Oneil Horde Warfarin held because of posttraumatic pelvic hematoma.  Postop DVT with 81 mg aspirin  twice daily x28 days.   Major depressive disorder, recurrent, moderate (HCC) 12/09/2020   Migraine    used to have them right bad; I don't now (04/11/2015)   MITRAL REGURGITATION 04/27/2010   Qualifier: Diagnosis of  By: Delford, MD, CODY Maude Dunnings    Osteoporosis    Paroxysmal atrial fibrillation [facility])    Pelvic hematoma, female 03/27/2021   Sustained in mechanical fall.  Warfarin held.  IM nailing performed 5/17 with postop DVT prophylaxis 81 mg twice daily x28 days.   PONV (postoperative nausea and vomiting)    Vertigo     Past Surgical History:  Procedure Laterality Date   BRAVO [facility] STUDY  11/15/2012   Procedure: BRAVO PH STUDY;  Surgeon: Margo LITTIE Haddock, MD;  Location: [facility] ENDO  SUITE;  Service: Endoscopy;;   CARDIOVERSION N/A 05/16/2020   Procedure: CARDIOVERSION;  Surgeon: Kate Lonni CROME, MD;  Location: [facility] ENDOSCOPY;  Service: Cardiovascular;  Laterality: N/A;   CATARACT EXTRACTION W/ INTRAOCULAR LENS  IMPLANT, BILATERAL Bilateral    COLONOSCOPY  2008   Dr. Harvey: internal hemorrhoids    DILATION AND CURETTAGE OF UTERUS     ESOPHAGEAL DILATION N/A 06/16/2021   Procedure: ESOPHAGEAL DILATION;  Surgeon: Eartha Angelia Sieving, MD;  Location: [facility] ENDO SUITE;  Service: Gastroenterology;  Laterality: N/A;   ESOPHAGOGASTRODUODENOSCOPY (EGD) WITH ESOPHAGEAL DILATION  2001   Dr.  Debrah: erosive esophagitis, esophageal stricture, duodenitis, s/p Savary dilation   ESOPHAGOGASTRODUODENOSCOPY (EGD) WITH ESOPHAGEAL DILATION  11/15/2012   DOQ:Zdneyjhzjo web was found & MOST LIKELY CAUSE FOR DYAPHAGIA/Polyp was found in the gastric body and gastric fundus/ gastritis on bx   ESOPHAGOGASTRODUODENOSCOPY (EGD) WITH PROPOFOL  N/A 06/16/2021   Procedure: ESOPHAGOGASTRODUODENOSCOPY (EGD) WITH PROPOFOL ;  Surgeon: Eartha Angelia Sieving, MD;  Location: [facility] ENDO SUITE;  Service: Gastroenterology;  Laterality: N/A;  8:15   EYE SURGERY Bilateral    laser OR after cataract OR; cause I couldn't see   FLEXIBLE SIGMOIDOSCOPY N/A 08/22/2014   mild diverticulosis in sigmoid, moderate sized Grade 3 hemorrhoids s/p banding X 3.    FRACTURE SURGERY Right    below the knee - 2 bones broke has plates   HEMORRHOID BANDING N/A 08/22/2014   Procedure: HEMORRHOID BANDING;  Surgeon: Margo CROME Harvey, MD;  Location: [facility] ENDO SU[facility];  Service: Endoscopy;  Laterality: N/A;   HEMORRHOID SURGERY N/A 03/30/2018   Procedure: EXTENSIVE HEMORRHOIDECTOMY;  Surgeon: Kallie Manuelita [name], MD;  Location: [facility] ORS;  Service: General;  Laterality: N/A;   INTRAMEDULLARY (IM) NAIL INTERTROCHANTERIC Right 03/24/2021   Procedure: INTRAMEDULLARY (IM) NAIL INTERTROCHANTRIC;  Surgeon: Onesimo Oneil LABOR, MD;  Location: [facility] ORS;  Service: Orthopedics;  Laterality: Right;   OPEN REDUCTION INTERNAL FIXATION (ORIF) TIBIA/FIBULA FRACTURE Right 2013   broke tibia and fibula after falling down stairs   PROLAPSED UTERINE FIBROID LIGATION  2015   TOTAL ABDOMINAL HYSTERECTOMY       reports that she has never smoked. She has never used smokeless tobacco. She reports that she does not drink alcohol and does not use drugs.  Allergies  Allergen Reactions   Codeine Nausea And Vomiting   Doxycycline  Other (See Comments)    Chest congestion   Triamterene-Hctz Other (See Comments)    weakness   Atorvastatin Other (See Comments)    Myalgias     Crestor [Rosuvastatin Calcium ] Other (See Comments)    weakness   Morphine  Nausea Only   Risedronate Sodium Other (See Comments)    ACTONEL - reflux    Family History  Problem Relation Age of Onset   Colon cancer Mother 17   Heart disease Mother    Osteoporosis Mother    Hip fracture Mother    Stroke Sister    Diabetes Sister    Osteoporosis Sister    Arthritis Sister    Uterine cancer Sister    Stroke Sister    Heart disease Father    Hyperlipidemia Brother    Hypertension Brother    Heart disease Brother    Stroke Brother    Heart disease Sister    Dementia Sister    Diabetes Son    Stroke Son    Heart attack Neg Hx    Prior to Admission medications   Medication Sig Start Date End Date Taking? Authorizing Provider  acetaminophen  (TYLENOL   8 HOUR) 650 MG CR tablet Take 1 tablet (650 mg total) by mouth every 8 (eight) hours as needed for pain. 12/19/23   Jolinda Potter M, DO  ASPIRIN  LOW DOSE 81 MG tablet TAKE 1 TABLET TWICE DAILY. SWALLOW WHOLE 03/09/24   Severa Rock HERO, FNP  bisacodyl  (DULCOLAX) 10 MG suppository Place 1 suppository (10 mg total) rectally as needed for moderate constipation. 07/06/23   Severa Rock HERO, FNP  cholecalciferol  (VITAMIN D3) 25 MCG (1000 UNIT) tablet Take 1,000 Units by mouth in the morning.    [provider]  colchicine  0.6 MG tablet Take 1.2 mg then one hour later can take 0.6 mg. Max 1.8 mg/day 04/16/24   Lavell Lye A, FNP  cyanocobalamin  (VITAMIN B12) 1000 MCG tablet Take 1,000 mcg by mouth 4 (four) times a week.    [provider]  diltiazem  (CARDIZEM  CD) 240 MG 24 hr capsule Take 1 capsule (240 mg total) by mouth daily. 03/01/24   Severa Rock HERO, FNP  fluticasone  (FLONASE ) 50 MCG/ACT nasal spray Place 2 sprays into both nostrils daily. 11/24/23   [facility] [place] Sebastian Pool, NP  meclizine  (ANTIVERT ) 25 MG tablet Take 1 tablet (25 mg total) by mouth 3 (three) times daily as needed for dizziness. 01/04/24   Severa Rock HERO, FNP   methocarbamol  (ROBAXIN ) 500 MG tablet Take 1 tablet (500 mg total) by mouth every 8 (eight) hours as needed for muscle spasms. 09/18/23   Ricky Fines, MD  metoprolol  succinate (TOPROL -XL) 100 MG 24 hr tablet Take 1 tablet (100 mg total) by mouth 2 (two) times daily. Take with or immediately following a meal. 03/01/24   Rakes, Rock HERO, FNP  midodrine  (PROAMATINE ) 2.5 MG tablet Take 1 tablet (2.5 mg total) by mouth 2 (two) times daily with a meal. 03/01/24   Rakes, Rock HERO, FNP  naproxen  (NAPROSYN ) 375 MG tablet Take 1 tablet (375 mg total) by mouth 2 (two) times daily. 06/08/24   Idol, Julie, PA-C  omeprazole  (PRILOSEC) 20 MG capsule Take 1 capsule (20 mg total) by mouth daily. 03/01/24   Severa Rock HERO, FNP  ondansetron  (ZOFRAN ) 4 MG tablet Take 1 tablet (4 mg total) by mouth every 8 (eight) hours as needed for nausea or vomiting. 01/04/24   Rakes, Rock HERO, FNP  polyethylene glycol (MIRALAX  / GLYCOLAX ) 17 g packet Take 17 g by mouth 2 (two) times daily as needed for mild constipation. 08/10/23   Von Bellis, MD  potassium chloride  (KLOR-CON  M) 10 MEQ tablet TAKE 1 TABLET EVERY DAY (TAKE WHILE TAKING DEMADEX /TORSEMIDE  AS DIRECTED) 03/06/24   Severa Rock HERO, FNP  potassium chloride  (KLOR-CON ) 10 MEQ tablet Take 1 tablet (10 mEq total) by mouth daily. Take While taking Demadex /Torsemide  10/11/23   Rakes, Rock HERO, FNP  simvastatin  (ZOCOR ) 20 MG tablet Take 1 tablet (20 mg total) by mouth daily. 03/01/24   Severa Rock HERO, FNP  torsemide  (DEMADEX ) 20 MG tablet Take 1 tablet (20 mg total) by mouth daily. Breakfast and lunch 03/01/24   Severa Rock HERO, FNP  traZODone  (DESYREL ) 50 MG tablet Take 1 tablet (50 mg total) by mouth at bedtime as needed for sleep. 03/01/24   Severa Rock HERO, FNP    Physical Exam: Vitals:   06/12/24 1304 06/12/24 1308 06/12/24 1310  BP:  107/82   Pulse:  (!) 111   Resp:  (!) 26   Temp:   97.7 F (36.5 C)  TempSrc:   Oral  SpO2:  92% 94%  Weight:  45.8 kg    Height: 4' 8 (1.422 m)       Constitutional: NAD, calm, comfortable Vitals:   06/12/24 1304 06/12/24 1308 06/12/24 1310  BP:  107/82   Pulse:  (!) 111   Resp:  (!) 26   Temp:   97.7 F (36.5 C)  TempSrc:   Oral  SpO2:  92% 94%  Weight: 45.8 kg    Height: 4' 8 (1.422 m)     Eyes: PERRL, lids and conjunctivae normal ENMT: Mucous membranes are dry. Neck: normal, supple, no masses, no thyromegaly Respiratory: clear to auscultation bilaterally, no wheezing, no crackles. Normal respiratory effort. No accessory muscle use.  Cardiovascular: Regular rate and rhythm, no murmurs / rubs / gallops. No extremity edema.  Abdomen: no tenderness, no masses palpated. No hepatosplenomegaly. Bowel sounds positive.  Musculoskeletal: no clubbing / cyanosis. No joint deformity upper and lower extremities.   Skin: no rashes, lesions, ulcers. No induration Neurologic: No facial asymmetry, speech fluent. Psychiatric: Awake, mildly lethargic able to answer questions appropriately, oriented to person, place but not situation.    Labs on Admission: I have personally reviewed following labs and imaging studies  CBC: Recent Labs  Lab 06/08/24 0953 06/12/24 1410  WBC 10.3 8.8  NEUTROABS  --  7.0  HGB 15.7* 14.1  HCT 45.7 40.2  MCV 98.5 96.2  PLT 140* 112*   Basic Metabolic Panel: Recent Labs  Lab 06/08/24 0953 06/12/24 1410  NA 137 128*  K 4.1 5.7*  CL 97* 93*  CO2 26 18*  GLUCOSE 181* 106*  BUN 20 76*  CREATININE 0.95 2.52*  CALCIUM  8.7* 7.9*   GFR: Estimated Creatinine Clearance: 10.3 mL/min (A) (by C-G formula based on SCr of 2.52 mg/dL (H)). Liver Function Tests: Recent Labs  Lab 06/12/24 1410  AST 65*  ALT 64*  ALKPHOS 77  BILITOT 1.8*  PROT 6.3*  ALBUMIN 3.2*   Urine analysis:    Component Value Date/Time   COLORURINE YELLOW 06/12/2024 1322   APPEARANCEUR CLEAR 06/12/2024 1322   APPEARANCEUR Clear 09/06/2023 1126   LABSPEC 1.017 06/12/2024 1322   PHURINE 5.0 06/12/2024 1322   GLUCOSEU  NEGATIVE 06/12/2024 1322   HGBUR NEGATIVE 06/12/2024 1322   BILIRUBINUR NEGATIVE 06/12/2024 1322   BILIRUBINUR Negative 09/06/2023 1126   KETONESUR NEGATIVE 06/12/2024 1322   PROTEINUR NEGATIVE 06/12/2024 1322   UROBILINOGEN negative 02/06/2015 1252   NITRITE NEGATIVE 06/12/2024 1322   LEUKOCYTESUR NEGATIVE 06/12/2024 1322    Radiological Exams on Admission: DG Chest Port 1 View Result Date: 06/12/2024 EXAM: 1 VIEW XRAY OF THE CHEST 06/12/2024 01:34:00 PM COMPARISON: [facility] radiograph of the chest dated 06/08/2024. CLINICAL HISTORY: SOB. Pt. In today from [facility]. Pt. Fell on 06/08/2024 with no injuries but was at followup appointment at doctors office. Family states to EMS that she is more lethargic. Pt. States I just don't feel well. Pt. Does have Afib (controlled). Pt. Is alert now but only oriented to self and place. Hx of hypertension, AFIB. Non smoker. FINDINGS: LUNGS AND PLEURA: Prominent interstitial opacities which appear chronic. No focal pulmonary opacity. No pulmonary edema. No pleural effusion. No pneumothorax. HEART AND MEDIASTINUM: The heart is mildly enlarged. There is calcification within the aortic arch. No acute abnormality of the cardiac and mediastinal silhouettes. BONES AND SOFT TISSUES: No acute osseous abnormality. IMPRESSION: 1. No acute process. 2. Mildly enlarged heart. 3. Prominent interstitial opacities, likely chronic. Electronically signed by: evalene coho 06/12/2024 02:26 PM EDT RP Workstation: [id number]  CT Head Wo Contrast Result Date: 06/12/2024 EXAM: CT HEAD WITHOUT CONTRAST 06/12/2024 01:53:47 PM TECHNIQUE: CT of the head was performed without the administration of intravenous contrast. Automated exposure control, iterative reconstruction, and/or weight based adjustment of the mA/kV was utilized to reduce the radiation dose to as low as reasonably achievable. COMPARISON: CT of the head dated 09/08/2024. CLINICAL HISTORY: Head trauma, moderate-severe. Fall  06/08/2024, patient states she doesn't feel right. FINDINGS: BRAIN AND VENTRICLES: No acute hemorrhage. Gray-white differentiation is preserved. No hydrocephalus. No extra-axial collection. No mass effect or midline shift. A curvilinear dystrophic calcification is again demonstrated within the left occipital lobe. There is age-related cerebral volume loss and there is mild periventricular white matter disease. ORBITS: The patient is status post bilateral lens replacement. SINUSES: There is mucosal disease within the left maxillary sinus. SOFT TISSUES AND SKULL: No acute soft tissue abnormality. No skull fracture. VASCULATURE: There is moderate chronic atheromatous disease within the carotid siphons and vertebral arteries. IMPRESSION: 1. No acute intracranial abnormality related to the reported head trauma. 2. Curvilinear dystrophic calcification within the left occipital lobe, age-related cerebral volume loss, and mild periventricular white matter disease. 3. Moderate chronic atheromatous disease within the carotid siphons and vertebral arteries. 4. Mucosal disease within the left maxillary sinus. Electronically signed by: evalene coho 06/12/2024 02:25 PM EDT RP Workstation: [id number]   EKG: None.   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  Likely due to dehydration from poor oral intake over the past few days.  Also on torsemide  20 mg BID.  Acute metabolic encephalopathy Presenting with confusion and lethargy.  Likely secondary to uncontrolled pain and resultant dehydration from poor oral intake.  Head CT negative for acute abnormality.  UA not suggestive of infection, chest x-ray clear. -Hydrate.  Falls Recent  mechanical fall 8/1.  Ambulates with wheelchair at baseline.  ALF resident.  Recent imaging included head CT, cervical CT, CT pelvis, right knee x-ray, negative for acute abnormality.  Afib [facility]) Follows with Dr. Lavona- per cardiology notes 10/2023-has failed multiple attempts at cardioversion, too frail l for Watchman, not a good candidate for anticoagulation due to history of falls. -Resume aspirin , diltiazem  and metoprolol - med rec pending.  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  Presenting with dehydration and AKI.  Last echo 09/2023 EF of 55 to 60%. - Hold torsemide    DVT prophylaxis: Heparin  Code Status: DNR-ACP documents reviewed Family Communication: None at bedside Disposition Plan: ~ 2 days Consults called: None Admission status:   Obs Tele    Author: Tully FORBES Carwin, MD 06/12/2024 4:35 PM  For on call review [email or web address].

## 2024-06-12 NOTE — Assessment & Plan Note (Signed)
 Stable and compensated.  Presenting with dehydration and AKI.  Last echo 09/2023 EF of 55 to 60%. - Hold torsemide 

## 2024-06-12 NOTE — ED Triage Notes (Signed)
 Pt. In today from [facility]. Pt. Fell on 06/08/24 with no injuries but was at followup appointment at doctors office. Family states to EMS that she is more lethargic. Pt. States I just don't feel well. Pt. Does have Afib (controlled). Pt. Is alert now but only oriented to self and place.

## 2024-06-12 NOTE — Assessment & Plan Note (Signed)
 Recent mechanical fall 8/1.  Ambulates with wheelchair at baseline.  ALF resident.  Recent imaging included head CT, cervical CT, CT pelvis, right knee x-ray, negative for acute abnormality.

## 2024-06-12 NOTE — Progress Notes (Signed)
 Subjective:  Patient ID: Norma Barajas, female    DOB: Apr 23, 1941, 83 y.o.   MRN: [id number]  Patient Care Team: Severa Rock HERO, FNP as PCP - General (Family Medicine) Lavona Agent, MD as PCP - Cardiology (Cardiology) Harvey Margo CROME, MD (Inactive) as Attending Physician (Gastroenterology) Nivia Sally Alice, OD as Consulting Physician (Optometry) Dow Arland [name], NP (Inactive) as Consulting Physician (Cardiology)   Chief Complaint:  Hospitalization Follow-up (06/08/2024 (5 hours)/[facility] at [facility]- fall)   HPI: Norma Barajas is a 83 y.o. female presenting on 06/12/2024 for Hospitalization Follow-up (06/08/2024 (5 hours)/ Emergency Department at [facility]- fall)  Norma Barajas is an 83 year old female who presents with confusion and lethargy following a recent fall. She is accompanied by a caregiver.  She has been experiencing confusion and lethargy since today, following a fall on Friday. She fell while attempting to walk and retrieve her phone, landing on her right hip, which is the side where she previously sustained a fracture. Following the fall, she was evaluated and no acute injuries were found. She was discharged with naproxen  for pain management.  Since the fall, she has remained in bed, complaining of pain, and has stopped eating and drinking. Her caregiver reports that she was lethargic and confused today, which is not her usual state. She was reportedly okay last night but had a rough day yesterday, being uncooperative and refusing to eat or drink. Her caregiver also noted that she was breathing shallowly on Friday, raising concerns about her oxygen  levels.  She has received all her regular medications today, but her blood pressure is noted to be low at 112/58. She is not eating or drinking normally, which is a significant change from her baseline behavior.        Relevant past medical, surgical, family, and social history  reviewed and updated as indicated.  Allergies and medications reviewed and updated. Data reviewed: Chart in Epic.   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 Nonobstructive minimal CAD 10/2005.   Depression    Diastolic dysfunction    Grade 1. Ejection fraction 60-65%.   Dysrhythmia    a fib   Erosive esophagitis    Essential hypertension    Fracture of ramus of right pubis with routine healing 12/09/2020   GERD (gastroesophageal reflux disease)    Grade III hemorrhoids    History of hiatal hernia    History of prediabetes    Hypercholesterolemia    Internal hemorrhoids with complication 07/29/2008   OCT 2015 FLEX SIG/IH BANDING     Intertrochanteric fracture of femur (HCC)    5/16 - 03/26/2021 fracture sustained in a mechanical fall.  IM nailing performed 5/17 by Dr Oneil Horde Warfarin held because of posttraumatic pelvic hematoma.  Postop DVT with 81 mg aspirin  twice daily x28 days.   Major depressive disorder, recurrent, moderate (HCC) 12/09/2020   Migraine    used to have them right bad; I don't now (04/11/2015)   MITRAL REGURGITATION 04/27/2010   Qualifier: Diagnosis of  By: Delford, MD, CODY Maude Dunnings    Osteoporosis    Paroxysmal atrial fibrillation [facility])    Pelvic hematoma, female 03/27/2021  Sustained in mechanical fall.  Warfarin held.  IM nailing performed 5/17 with postop DVT prophylaxis 81 mg twice daily x28 days.   PONV (postoperative nausea and vomiting)    Vertigo     Past Surgical History:  Procedure Laterality Date   BRAVO [facility] STUDY  11/15/2012   Procedure: BRAVO PH STUDY;  Surgeon: Margo LITTIE Haddock, MD;  Location[facility] END[facility];  Service: Endoscopy;;   CARDIOVERSION N/A 05/16/2020   Procedure: CARDIOVERSION;  Surgeon: Kate Lonni LITTIE, MD;  Location: [facility] ENDOSCOPY;  Service: Cardiovascular;  Laterality: N/A;   CATARACT EXTRACTION W/ INTRAOCULAR LENS  IMPLANT, BILATERAL Bilateral    COLONOSCOPY  2008   Dr. Haddock: internal hemorrhoids    DILATION AND CURETTAGE OF UTERUS     ESOPHAGEAL DILATION N/A 06/16/2021   Procedure: ESOPHAGEAL DILATION;  Surgeon: Eartha Angelia Sieving, MD;  Location: [facility];  Service: Gastroenterology;  Laterality: N/A;   ESOPHAGOGASTRODUODENOSCOPY (EGD) WITH ESOPHAGEAL DILATION  2001   Dr. Debrah: erosive esophagitis, esophageal stricture, duodenitis, s/p Savary dilation   ESOPHAGOGASTRODUODENOSCOPY (EGD) WITH ESOPHAGEAL DILATION  11/15/2012   DOQ:Zdneyjhzjo web was found & MOST LIKELY CAUSE FOR DYAPHAGIA/Polyp was found in the gastric body and gastric fundus/ gastritis on bx   ESOPHAGOGASTRODUODENOSCOPY (EGD) WITH PROPOFOL  N/A 06/16/2021   Procedure: ESOPHAGOGASTRODUODENOSCOPY (EGD) WITH PROPOFOL ;  Surgeon: Eartha Angelia Sieving, MD;  [facility];  Service: Gastroenterology;  Laterality: N/A;  8:15   EYE SURGERY Bilateral    laser OR after cataract OR; cause I couldn't see   FLEXIBLE SIGMOIDOSCOPY N/A 08/22/2014   mild diverticulosis in sigmoid, moderate sized Grade 3 hemorrhoids s/p banding X 3.    FRACTURE SURGERY Right    below the knee - 2 bones broke has plates   HEMORRHOID BANDING N/A 08/22/2014   Procedure: HEMORRHOID BANDING;  Surgeon: Margo LITTIE Haddock, MD;  [facility];  Service: Endoscopy;  Laterality: N/A;   HEMORRHOID SURGERY N/A 03/30/2018   Procedure: EXTENSIVE HEMORRHOIDECTOMY;  Surgeon: Kallie Manuelita [name], MD;  Location: [facility] ORS;  Service: General;  Laterality: N/A;   INTRAMEDULLARY (IM) NAIL INTERTROCHANTERIC Right 03/24/2021   Procedure: INTRAMEDULLARY (IM) NAIL INTERTROCHANTRIC;  Surgeon: Onesimo Oneil LABOR, MD;  Location: [facility] ORS;  Service: Orthopedics;  Laterality: Right;   OPEN REDUCTION INTERNAL FIXATION (ORIF) TIBIA/FIBULA FRACTURE Right  2013   broke tibia and fibula after falling down stairs   PROLAPSED UTERINE FIBROID LIGATION  2015   TOTAL ABDOMINAL HYSTERECTOMY      Social History   Socioeconomic History   Marital status: Widowed    Spouse name: george    Number of children: 1   Years of education: Not on file   Highest education level: Not on file  Occupational History   Occupation: Retired    Comment: Textile  Tobacco Use   Smoking status: Never   Smokeless tobacco: Never  Vaping Use   Vaping status: Never Used  Substance and Sexual Activity   Alcohol use: No    Alcohol/week: 0.0 standard drinks of alcohol   Drug use: No   Sexual activity: Not Currently    Birth control/protection: Surgical, Post-menopausal    Comment: hyst  Other Topics Concern   Not on file  Social History Narrative   Married   No regular exercise   Social Drivers of Health   Financial Resource Strain: Low Risk  (07/04/2023)   Overall Financial Resource Strain (CARDIA)    Difficulty of Paying Living Expenses: Not hard at all  Food Insecurity: No Food Insecurity (09/15/2023)   Hunger Vital Sign    Worried About Running Out of Food in the Last Year: Never true    Ran Out of Food in the Last Year: Never true  Transportation Needs: No Transportation Needs (09/15/2023)   PRAPARE - Administrator, Civil Service (Medical): No    Lack of Transportation (Non-Medical): No  Physical Activity: Unknown (07/04/2023)   Exercise Vital Sign    Days of Exercise per Week: 0 days    Minutes of Exercise per Session: Not on file  Stress: Stress Concern Present (07/04/2023)   [employer] of Occupational Health - Occupational Stress Questionnaire    Feeling of Stress : To some extent  Social Connections: Moderately Isolated (07/04/2023)   Social Connection and Isolation Panel    Frequency of Communication with Friends and Family: More than three times a week    Frequency of Social Gatherings with Friends and Family: More than  three times a week    Attends Religious Services: More than 4 times per year    Active Member of [employer] or Organizations: No    Attends Banker Meetings: Not on file    Marital Status: Widowed  Intimate Partner Violence: Not At Risk (09/15/2023)   Humiliation, Afraid, Rape, and Kick questionnaire    Fear of Current or Ex-Partner: No    Emotionally Abused: No    Physically Abused: No    Sexually Abused: No    Outpatient Encounter Medications as of 06/12/2024  Medication Sig   acetaminophen  (TYLENOL  8 HOUR) 650 MG CR tablet Take 1 tablet (650 mg total) by mouth every 8 (eight) hours as needed for pain.   ASPIRIN  LOW DOSE 81 MG tablet TAKE 1 TABLET TWICE DAILY. SWALLOW WHOLE   bisacodyl  (DULCOLAX) 10 MG suppository Place 1 suppository (10 mg total) rectally as needed for moderate constipation.   cholecalciferol  (VITAMIN D3) 25 MCG (1000 UNIT) tablet Take 1,000 Units by mouth in the morning.   colchicine  0.6 MG tablet Take 1.2 mg then one hour later can take 0.6 mg. Max 1.8 mg/day   cyanocobalamin  (VITAMIN B12) 1000 MCG tablet Take 1,000 mcg by mouth 4 (four) times a week.   diltiazem  (CARDIZEM  CD) 240 MG 24 hr capsule Take 1 capsule (240 mg total) by mouth daily.   fluticasone  (FLONASE ) 50 MCG/ACT nasal spray Place 2 sprays into both nostrils daily.   meclizine  (ANTIVERT ) 25 MG tablet Take 1 tablet (25 mg total) by mouth 3 (three) times daily as needed for dizziness.   methocarbamol  (ROBAXIN ) 500 MG tablet Take 1 tablet (500 mg total) by mouth every 8 (eight) hours as needed for muscle spasms.   metoprolol  succinate (TOPROL -XL) 100 MG 24 hr tablet Take 1 tablet (100 mg total) by mouth 2 (two) times daily. Take with or immediately following a meal.   midodrine  (PROAMATINE ) 2.5 MG tablet Take 1 tablet (2.5 mg total) by mouth 2 (two) times daily with a meal.   naproxen  (NAPROSYN ) 375 MG tablet Take 1 tablet (375 mg total) by mouth 2 (two) times daily.   omeprazole  (PRILOSEC) 20 MG  capsule Take 1 capsule (20 mg total) by mouth daily.   ondansetron  (ZOFRAN ) 4 MG tablet Take 1 tablet (4 mg total) by mouth every 8 (eight) hours as needed for nausea or vomiting.   polyethylene glycol (MIRALAX  / GLYCOLAX ) 17 g packet Take 17 g by mouth 2 (two) times daily as needed for mild constipation.  potassium chloride  (KLOR-CON  M) 10 MEQ tablet TAKE 1 TABLET EVERY DAY (TAKE WHILE TAKING DEMADEX /TORSEMIDE  AS DIRECTED)   potassium chloride  (KLOR-CON ) 10 MEQ tablet Take 1 tablet (10 mEq total) by mouth daily. Take While taking Demadex /Torsemide    simvastatin  (ZOCOR ) 20 MG tablet Take 1 tablet (20 mg total) by mouth daily.   torsemide  (DEMADEX ) 20 MG tablet Take 1 tablet (20 mg total) by mouth daily. Breakfast and lunch   traZODone  (DESYREL ) 50 MG tablet Take 1 tablet (50 mg total) by mouth at bedtime as needed for sleep.   No facility-administered encounter medications on file as of 06/12/2024.    Allergies  Allergen Reactions   Codeine Nausea And Vomiting   Doxycycline  Other (See Comments)    Chest congestion   Triamterene-Hctz Other (See Comments)    weakness   Atorvastatin Other (See Comments)    Myalgias    Crestor [Rosuvastatin Calcium ] Other (See Comments)    weakness   Morphine  Nausea Only   Risedronate Sodium Other (See Comments)    ACTONEL - reflux    Pertinent ROS per HPI, otherwise unremarkable      Objective:  BP (!) 112/58 (BP Location: Left Arm, Cuff Size: Small)   Pulse 62   Temp (!) 97 F (36.1 C)   Ht 4' 8 (1.422 m)   SpO2 95%   BMI 22.64 kg/m    Wt Readings from Last 3 Encounters:  06/08/24 101 lb (45.8 kg)  03/01/24 101 lb 12.8 oz (46.2 kg)  11/24/23 104 lb (47.2 kg)    Physical Exam Vitals and nursing note reviewed.  Constitutional:      General: She is in acute distress.     Appearance: She is ill-appearing.  HENT:     Head: Normocephalic and atraumatic.  Cardiovascular:     Rate and Rhythm: Normal rate. Rhythm irregularly irregular.   Pulmonary:     Effort: Respiratory distress present.  Musculoskeletal:     Right lower leg: No edema.     Left lower leg: No edema.  Neurological:     Mental Status: She is lethargic, disoriented and confused.     Comments: Unable to follow commands, confused, lethargic, weak grip strength   Psychiatric:        Speech: Speech is delayed and slurred.     Results for orders placed or performed during the hospital encounter of 06/08/24  Basic metabolic panel   Collection Time: 06/08/24  9:53 AM  Result Value Ref Range   Sodium 137 135 - 145 mmol/L   Potassium 4.1 3.5 - 5.1 mmol/L   Chloride 97 (L) 98 - 111 mmol/L   CO2 26 22 - 32 mmol/L   Glucose, Bld 181 (H) 70 - 99 mg/dL   BUN 20 8 - 23 mg/dL   Creatinine, Ser 9.04 0.44 - 1.00 mg/dL   Calcium  8.7 (L) 8.9 - 10.3 mg/dL   GFR, Estimated 59 (L) >60 mL/min   Anion gap 14 5 - 15  CBC   Collection Time: 06/08/24  9:53 AM  Result Value Ref Range   WBC 10.3 4.0 - 10.5 K/uL   RBC 4.64 3.87 - 5.11 MIL/uL   Hemoglobin 15.7 (H) 12.0 - 15.0 g/dL   HCT 54.2 63.9 - 53.9 %   MCV 98.5 80.0 - 100.0 fL   MCH 33.8 26.0 - 34.0 pg   MCHC 34.4 30.0 - 36.0 g/dL   RDW 85.3 88.4 - 84.4 %   Platelets 140 (L) 150 - 400  K/uL   nRBC 0.0 0.0 - 0.2 %   *Note: Due to a large number of results and/or encounters for the requested time period, some results have not been displayed. A complete set of results can be found in Results Review.       Pertinent labs & imaging results that were available during my care of the patient were reviewed by me and considered in my medical decision making.  Assessment & Plan:  Norma Barajas was seen today for hospitalization follow-up.  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 Not following commands and exhibiting atypical behavior. - Send to hospital for further evaluation and management - Call ambulance for transport  Hypotension Blood pressure at 112/58, lower than baseline, contributing to lethargy and altered mental status. - Send to hospital for further evaluation  Right hip pain after recent fall Right hip pain following a fall on Friday with a history of previous fracture in the same area. No acute findings on initial evaluation, but persistent pain reported. - Administer naproxen  as previously prescribed        Continue all other maintenance medications.  Follow up plan: ED via EMS   Rosaline Bruns, FNP-C [facility] [phone]

## 2024-06-12 NOTE — Assessment & Plan Note (Addendum)
 Follows with Dr. Lavona- per cardiology notes 10/2023-has failed multiple attempts at cardioversion, too frail l for Watchman, not a good candidate for anticoagulation due to history of falls. -Resume aspirin , diltiazem  and metoprolol - med rec pending.  Also on midodrine , resumed

## 2024-06-12 NOTE — ED Notes (Signed)
 Pt. To CT

## 2024-06-12 NOTE — Assessment & Plan Note (Signed)
 Presenting with confusion and lethargy.  Likely secondary to uncontrolled pain and resultant dehydration from poor oral intake.  Head CT negative for acute abnormality.  UA not suggestive of infection, chest x-ray clear. -Hydrate.

## 2024-06-13 ENCOUNTER — Ambulatory Visit: Payer: Self-pay | Admitting: Family Medicine

## 2024-06-13 DIAGNOSIS — N179 Acute kidney failure, unspecified: Secondary | ICD-10-CM | POA: Diagnosis not present

## 2024-06-13 LAB — BASIC METABOLIC PANEL WITH GFR
Anion gap: 12 (ref 5–15)
BUN: 54 mg/dL — ABNORMAL HIGH (ref 8–23)
CO2: 18 mmol/L — ABNORMAL LOW (ref 22–32)
Calcium: 7.9 mg/dL — ABNORMAL LOW (ref 8.9–10.3)
Chloride: 103 mmol/L (ref 98–111)
Creatinine, Ser: 1.5 mg/dL — ABNORMAL HIGH (ref 0.44–1.00)
GFR, Estimated: 34 mL/min — ABNORMAL LOW (ref >60)
Glucose, Bld: 105 mg/dL — ABNORMAL HIGH (ref 70–99)
Potassium: 4.4 mmol/L (ref 3.5–5.1)
Sodium: 133 mmol/L — ABNORMAL LOW (ref 135–145)

## 2024-06-13 MED ORDER — SODIUM CHLORIDE 0.9 % IV SOLN: INTRAVENOUS | Status: AC

## 2024-06-13 NOTE — Progress Notes (Signed)
 Mobility Specialist Progress Note:    06/13/24 0850  Mobility  Activity Refused and notified nurse if applicable   Pt refused mobility, did not want to attempt to transfer. All needs met.   Sherrilee Ditty Mobility Specialist Please contact via Special educational needs teacher or  Rehab office at [phone]

## 2024-06-13 NOTE — TOC Initial Note (Signed)
 Transition of Care [facility]) - Initial/Assessment Note    Patient Details  Name: Norma Barajas MRN: [id number] Date of Birth: Mar 29, 1941  Transition of Care [facility]) CM/SW Contact:    Mcarthur Saddie Kim, LCSW Phone Number: 06/13/2024, 9:17 AM  Clinical Narrative: Pt admitted due to acute kidney injury. Per niece, Elveria, pt has been a resident at [facility] for about a year. She requests return to facility when medically stable. LCSW spoke with Nathanel at [facility] who indicates pt requires extensive assist with ADLs. She uses a wheelchair at facility. No current home health services. Pt is okay to return and will not need to be assessed by ALF prior to d/c. TOC will continue to follow.                   Expected Discharge Plan: Assisted Living Barriers to Discharge: Continued Medical Work up   Patient Goals and CMS Choice Patient states their goals for this hospitalization and ongoing recovery are:: return to ALF     [facility] ownership interest in [facility].provided to::  (n/a)    Expected Discharge Plan and Services In-house Referral: Clinical Social Work   Post Acute Care Choice: Resumption of Svcs/PTA Provider Living arrangements for the past 2 months: Assisted Living Facility                                      Prior Living Arrangements/Services Living arrangements for the past 2 months: Assisted Living Facility Lives with:: Facility Resident Patient language and need for interpreter reviewed:: Yes Do you feel safe going back to the place where you live?: Yes      Need for Family Participation in Patient Care: Yes (Comment) Care giver support system in place?: Yes (comment) Current home services: DME (wheelchair) Criminal Activity/Legal Involvement Pertinent to Current Situation/Hospitalization: No - Comment as needed  Activities of Daily Living   ADL Screening (condition at time of admission) Independently performs ADLs?: No Does the patient  have a NEW difficulty with bathing/dressing/toileting/self-feeding that is expected to last >3 days?: No Does the patient have a NEW difficulty with getting in/out of bed, walking, or climbing stairs that is expected to last >3 days?: No Does the patient have a NEW difficulty with communication that is expected to last >3 days?: No Is the patient deaf or have difficulty hearing?: No Does the patient have difficulty seeing, even when wearing glasses/contacts?: No Does the patient have difficulty concentrating, remembering, or making decisions?: Yes  Permission Sought/Granted   Permission granted to share information with : Yes, Verbal Permission Granted     Permission granted to share info w AGENCY: [employer]  Permission granted to share info w Relationship: ALF     Emotional Assessment   Attitude/Demeanor/Rapport: Unable to Assess Affect (typically observed): Unable to Assess Orientation: : Oriented to Self, Oriented to Place Alcohol / Substance Use: Not Applicable Psych Involvement: No (comment)  Admission diagnosis:  AKI (acute kidney injury) (HCC) [N17.9] Patient Active Problem List   Diagnosis Date Noted   AKI (acute kidney injury) (HCC) 06/12/2024   Acute metabolic encephalopathy 06/12/2024   Falls 06/12/2024   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   Stage 2 chronic kidney disease 03/01/2024   Vitamin B12 deficiency 09/01/2023   Pelvic fracture (HCC) 08/05/2023   Vertigo 07/01/2023   Afib (HCC) 04/27/2023   Iron  deficiency 11/03/2022   Nonspecific abnormal electrocardiogram (ECG) (  EKG) 08/29/2022   Abnormal SPEP 02/07/2022   Degenerative disc disease, lumbar 12/20/2021   Hypertensive heart disease with chronic diastolic congestive heart failure (HCC) 03/27/2021   Gallbladder sludge 03/27/2021   Aortic atherosclerosis (HCC) 03/27/2021   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) 10/23/2020   Severe tricuspid valve regurgitation  10/23/2020   Hypokalemia 09/16/2020   Early satiety 07/15/2020   Difficulty sleeping 06/22/2019   Paroxysmal atrial fibrillation (HCC) 08/21/2015   Lumbar pain 08/07/2015   Vitamin D  deficiency 11/27/2014   Drug induced constipation 12/14/2013   HTN (hypertension) 07/24/2013   Coronary atherosclerosis of native coronary artery 07/24/2013   Dysphagia 09/28/2012   Pulmonary nodule 02/18/2011   Hyperlipidemia 07/29/2008   GAD (generalized anxiety disorder) 07/29/2008   Primary hypertension 07/29/2008   GERD 07/29/2008   Osteoporosis 07/29/2008   PCP:  Severa Rock HERO, FNP Pharmacy:   [facility] - [place], [place] - [address] [place] [address] Phone: [phone] Fax: [phone]  [facility] [place], [place] - [address] [place] [address] Phone: [phone] Fax: [phone]  [facility] [place], [place] - [address] [place] [address] Phone: [phone] Fax: [phone]     Social Drivers of Health (SDOH) Social History: SDOH Screenings   Food Insecurity: No Food Insecurity (06/12/2024)  Housing: Low Risk  (06/12/2024)  Transportation Needs: No Transportation Needs (06/12/2024)  Utilities: Not At Risk (06/12/2024)  Depression (PHQ2-9): Low Risk  (05/27/2023)  Financial Resource Strain: Low Risk  (07/04/2023)  Physical Activity: Unknown (07/04/2023)  Social Connections: Socially Isolated (06/12/2024)  Stress: Stress Concern Present (07/04/2023)  Tobacco Use: Low Risk  (06/12/2024)   SDOH Interventions:     Readmission Risk Interventions     No data to display

## 2024-06-13 NOTE — Progress Notes (Signed)
 PROGRESS NOTE     Norma Barajas, is a 83 y.o. female, DOB - [birth year], FMW:[id number]  Admit date - 06/12/2024   Admitting Physician Tully FORBES Carwin, MD  Outpatient Primary MD for the patient is Rakes, Rock HERO, FNP  LOS - 0  Chief Complaint  Patient presents with   Weakness        Brief Narrative:  83 y.o. female with medical history significant for paroxysmal atrial fibrillation, CHF, coronary artery disease, hypertension admitted from [facility] on 06/12/2024 with AKI and acute metabolic encephalopathy    -Assessment and Plan: 1)AKI (acute kidney injury) (HCC) Creatinine 2.52, baseline 0.8-0.9.  Likely due to dehydration from poor oral intake over the past few days.  Also on torsemide  20 mg BID. Creatinine down to 1.5 with hydration Will give additional IVF until oral intake is more reliable - renally adjust medications, avoid nephrotoxic agents / dehydration  / hypotension  2)Acute metabolic encephalopathy Presenting with confusion and lethargy.  Likely secondary to uncontrolled pain and resultant dehydration from poor oral intake.  Head CT negative for acute abnormality.  UA not suggestive of infection, chest x-ray clear. -improving mentation with hydration  3)Falls/Generalized Weakness Recent mechanical fall 06/08/24  Ambulates with wheelchair at baseline.  ALF resident.  Recent imaging included head CT, cervical CT, CT pelvis, right knee x-ray, negative for acute abnormality. -Get generalized weakness  4)Afib (HCC) Follows with Dr. Lavona- per cardiology notes 10/2023-has failed multiple attempts at cardioversion, too frail l for Watchman, not a good candidate for anticoagulation due to history of falls. 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  Presenting with dehydration and AKI.  Last echo 09/2023 EF of 55 to 60%. - Hold torsemide  --Hydrate iv and  orally as above  6)HypoNatremia---due to dehydration, improving with hydration  7)Hyperkalemia----resolved   8)Transaminitis---?? Due to shock Liver (pt had transient hypotension due to dehydration) monitor closely, trend it  Status is: Inpatient   Disposition: The patient is from: ALF              Anticipated d/c is to: ALF Vs SNF              Anticipated d/c date is: 1 day              Patient currently is not medically stable to d/c. Barriers: Not Clinically Stable-   Code Status :   Code Status: Limited: Do not attempt resuscitation (DNR) -DNR-LIMITED -Do Not Intubate/DNI    Family Communication:   No Family members at bedside  DVT Prophylaxis  :   - SCDs  heparin  injection 5,000 Units Start: 06/12/24 2200   Lab Results  Component Value Date   PLT 112 (L) 06/12/2024   Inpatient Medications  Scheduled Meds:  aspirin  EC  81 mg Oral Daily   diltiazem   240 mg Oral Daily   feeding supplement  237 mL Oral BID BM   heparin   5,000 Units Subcutaneous Q8H   metoprolol  succinate  100 mg Oral BID   midodrine   2.5 mg Oral BID WC   Continuous Infusions: PRN Meds:.acetaminophen  **OR** acetaminophen , HYDROcodone -acetaminophen , ondansetron  **OR** ondansetron  (ZOFRAN ) IV, polyethylene glycol, traZODone    Anti-infectives (From admission, onward)    None        Subjective: Tamma Jamison today has no fevers, no emesis,  No chest pain,    Oral intake is improving somewhat -  Objective: Vitals:   06/13/24  0114 06/13/24 0937 06/13/24 1231 06/13/24 1732  BP: 123/73 121/84 134/85 116/84  Pulse: 70 87 81 85  Resp: 17     Temp: (!) 97.5 F (36.4 C)  98.4 F (36.9 C)   TempSrc: Oral  Oral   SpO2: 94%  97%   Weight:      Height:        Intake/Output Summary (Last 24 hours) at 06/13/2024 1832 Last data filed at 06/13/2024 1630 Gross per 24 hour  Intake 327.16 ml  Output 500 ml  Net -172.84 ml   Filed Weights   06/12/24 1304  Weight: 45.8 kg    Physical Exam Gen:- Awake  Alert,  in no apparent distress  HEENT:- [place].AT, No sclera icterus Neck-Supple Neck,No JVD,.  Lungs-  CTAB , fair symmetrical air movement CV- S1, S2 normal, regular  Abd-  +ve B.Sounds, Abd Soft, No tenderness,    Extremity/Skin:- No  edema, pedal pulses present  Psych-affect is appropriate, oriented x3 Neuro-Generalized weakness, no new focal deficits, no tremors  Data Reviewed: I have personally reviewed following labs and imaging studies  CBC: Recent Labs  Lab 06/08/24 0953 06/12/24 1410  WBC 10.3 8.8  NEUTROABS  --  7.0  HGB 15.7* 14.1  HCT 45.7 40.2  MCV 98.5 96.2  PLT 140* 112*   Basic Metabolic Panel: Recent Labs  Lab 06/08/24 0953 06/12/24 1410 06/13/24 0542  NA 137 128* 133*  K 4.1 5.7* 4.4  CL 97* 93* 103  CO2 26 18* 18*  GLUCOSE 181* 106* 105*  BUN 20 76* 54*  CREATININE 0.95 2.52* 1.50*  CALCIUM  8.7* 7.9* 7.9*   GFR: Estimated Creatinine Clearance: 17.4 mL/min (A) (by C-G formula based on SCr of 1.5 mg/dL (H)). Liver Function Tests: Recent Labs  Lab 06/12/24 1410  AST 65*  ALT 64*  ALKPHOS 77  BILITOT 1.8*  PROT 6.3*  ALBUMIN 3.2*   Radiology Studies: DG Chest Port 1 View Result Date: 06/12/2024 EXAM: 1 VIEW XRAY OF THE CHEST 06/12/2024 01:34:00 PM COMPARISON: AP radiograph of the chest dated 06/08/2024. CLINICAL HISTORY: SOB. Pt. In today from [facility]. Pt. Fell on 06/08/2024 with no injuries but was at followup appointment at doctors office. Family states to EMS that she is more lethargic. Pt. States I just don't feel well. Pt. Does have Afib (controlled). Pt. Is alert now but only oriented to self and place. Hx of hypertension, AFIB. Non smoker. FINDINGS: LUNGS AND PLEURA: Prominent interstitial opacities which appear chronic. No focal pulmonary opacity. No pulmonary edema. No pleural effusion. No pneumothorax. HEART AND MEDIASTINUM: The heart is mildly enlarged. There is calcification within the aortic arch. No acute abnormality of the  cardiac and mediastinal silhouettes. BONES AND SOFT TISSUES: No acute osseous abnormality. IMPRESSION: 1. No acute process. 2. Mildly enlarged heart. 3. Prominent interstitial opacities, likely chronic. Electronically signed by: evalene coho 06/12/2024 02:26 PM EDT RP Workstation: [id number]   CT Head Wo Contrast Result Date: 06/12/2024 EXAM: CT HEAD WITHOUT CONTRAST 06/12/2024 01:53:47 PM TECHNIQUE: CT of the head was performed without the administration of intravenous contrast. Automated exposure control, iterative reconstruction, and/or weight based adjustment of the mA/kV was utilized to reduce the radiation dose to as low as reasonably achievable. COMPARISON: CT of the head dated 09/08/2024. CLINICAL HISTORY: Head trauma, moderate-severe. Fall 06/08/2024, patient states she doesn't feel right. FINDINGS: BRAIN AND VENTRICLES: No acute hemorrhage. Gray-white differentiation is preserved. No hydrocephalus. No extra-axial collection. No mass effect or midline shift. A curvilinear dystrophic  calcification is again demonstrated within the left occipital lobe. There is age-related cerebral volume loss and there is mild periventricular white matter disease. ORBITS: The patient is status post bilateral lens replacement. SINUSES: There is mucosal disease within the left maxillary sinus. SOFT TISSUES AND SKULL: No acute soft tissue abnormality. No skull fracture. VASCULATURE: There is moderate chronic atheromatous disease within the carotid siphons and vertebral arteries. IMPRESSION: 1. No acute intracranial abnormality related to the reported head trauma. 2. Curvilinear dystrophic calcification within the left occipital lobe, age-related cerebral volume loss, and mild periventricular white matter disease. 3. Moderate chronic atheromatous disease within the carotid siphons and vertebral arteries. 4. Mucosal disease within the left maxillary sinus. Electronically signed by: evalene coho 06/12/2024 02:25 PM EDT RP  Workstation: [id number]   Scheduled Meds:  aspirin  EC  81 mg Oral Daily   diltiazem   240 mg Oral Daily   feeding supplement  237 mL Oral BID BM   heparin   5,000 Units Subcutaneous Q8H   metoprolol  succinate  100 mg Oral BID   midodrine   2.5 mg Oral BID WC   Continuous Infusions:   LOS: 0 days   Rendall Carwin M.D on 06/13/2024 at 6:32 PM  Go to [email or web address] - for contact info  [facility] - Office  [phone]  If 7PM-7AM, please contact night-coverage [email or web address] 06/13/2024, 6:32 PM

## 2024-06-13 NOTE — Plan of Care (Signed)

## 2024-06-13 NOTE — ED Provider Notes (Signed)
 [facility] MEDICAL SURGICAL UNIT Provider Note   CSN: [id number] Arrival date & time: 06/12/24  1242     Patient presents with: Weakness   Norma Barajas is a 83 y.o. female.   Patient with a fall recently.  He is at his assisted living and was evaluated here with numerous x-rays for the fall.  Now she is lethargic and weak  The history is provided by the nursing home. No language interpreter was used.  Weakness Severity:  Moderate Onset quality:  Sudden Timing:  Constant Progression:  Worsening Chronicity:  New Context: not alcohol use   Relieved by:  Nothing Worsened by:  Nothing Ineffective treatments:  None tried Associated symptoms: no abdominal pain   Risk factors: no congestive heart failure        Prior to Admission medications   Medication Sig Start Date End Date Taking? Authorizing Provider  acetaminophen  (TYLENOL  8 HOUR) 650 MG CR tablet Take 1 tablet (650 mg total) by mouth every 8 (eight) hours as needed for pain. Patient taking differently: Take 650 mg by mouth every 8 (eight) hours. 12/19/23  Yes Jolinda Potter M, DO  aluminum-magnesium  hydroxide 200-200 MG/5ML suspension Take 30 mLs by mouth as needed for indigestion (heartburn). Up to 4 times a day in 24 hours.   Yes [provider]  ASPIRIN  LOW DOSE 81 MG tablet TAKE 1 TABLET TWICE DAILY. SWALLOW WHOLE Patient taking differently: Take 81 mg by mouth in the morning and at bedtime. 03/09/24  Yes Rakes, Rock HERO, FNP  cholecalciferol  (VITAMIN D3) 25 MCG (1000 UNIT) tablet Take 1,000 Units by mouth in the morning.   Yes [provider]  diltiazem  (CARDIZEM  CD) 240 MG 24 hr capsule Take 1 capsule (240 mg total) by mouth daily. 03/01/24  Yes Rakes, Rock HERO, FNP  meclizine  (ANTIVERT ) 25 MG tablet Take 1 tablet (25 mg total) by mouth 3 (three) times daily as needed for dizziness. 01/04/24  Yes Rakes, Rock HERO, FNP  metoprolol  succinate (TOPROL -XL) 100 MG 24 hr tablet Take 1 tablet (100 mg total) by  mouth 2 (two) times daily. Take with or immediately following a meal. 03/01/24  Yes Rakes, Rock HERO, FNP  midodrine  (PROAMATINE ) 2.5 MG tablet Take 1 tablet (2.5 mg total) by mouth 2 (two) times daily with a meal. 03/01/24  Yes Rakes, Rock HERO, FNP  naproxen  (NAPROSYN ) 375 MG tablet Take 1 tablet (375 mg total) by mouth 2 (two) times daily. 06/08/24  Yes Idol, Mliss, PA-C  omeprazole  (PRILOSEC) 20 MG capsule Take 1 capsule (20 mg total) by mouth daily. 03/01/24  Yes Rakes, Rock HERO, FNP  ondansetron  (ZOFRAN ) 4 MG tablet Take 1 tablet (4 mg total) by mouth every 8 (eight) hours as needed for nausea or vomiting. 01/04/24  Yes Rakes, Rock HERO, FNP  polyethylene glycol (MIRALAX  / GLYCOLAX ) 17 g packet Take 17 g by mouth 2 (two) times daily as needed for mild constipation. 08/10/23  Yes Von Bellis, MD  potassium chloride  (KLOR-CON  M) 10 MEQ tablet TAKE 1 TABLET EVERY DAY (TAKE WHILE TAKING DEMADEX /TORSEMIDE  AS DIRECTED) 03/06/24  Yes Rakes, Rock HERO, FNP  simvastatin  (ZOCOR ) 20 MG tablet Take 1 tablet (20 mg total) by mouth daily. Patient taking differently: Take 20 mg by mouth at bedtime. 03/01/24  Yes Rakes, Rock HERO, FNP  torsemide  (DEMADEX ) 20 MG tablet Take 1 tablet (20 mg total) by mouth daily. Breakfast and lunch Patient taking differently: Take 20 mg by mouth 2 (two) times daily. Breakfast and  lunch 03/01/24  Yes Rakes, Rock HERO, FNP  traZODone  (DESYREL ) 50 MG tablet Take 1 tablet (50 mg total) by mouth at bedtime as needed for sleep. 03/01/24  Yes Rakes, Rock HERO, FNP    Allergies: Codeine, Triamterene-hctz, Atorvastatin, Crestor [rosuvastatin calcium ], Doxycycline , Morphine , and Risedronate sodium    Review of Systems  Unable to perform ROS: Mental status change  Gastrointestinal:  Negative for abdominal pain.  Neurological:  Positive for weakness.    Updated Vital Signs BP 134/85 (BP Location: Left Arm)   Pulse 81   Temp 98.4 F (36.9 C) (Oral)   Resp 17   Ht 4' 8 (1.422 m)   Wt 45.8 kg   SpO2  97%   BMI 22.64 kg/m   Physical Exam Vitals and nursing note reviewed.  Constitutional:      Appearance: She is well-developed.     Comments: Lethargic  HENT:     Head: Normocephalic.     Nose: Nose normal.  Eyes:     General: No scleral icterus.    Conjunctiva/sclera: Conjunctivae normal.  Neck:     Thyroid : No thyromegaly.  Cardiovascular:     Rate and Rhythm: Normal rate and regular rhythm.     Heart sounds: No murmur heard.    No friction rub. No gallop.  Pulmonary:     Breath sounds: No stridor. No wheezing or rales.  Chest:     Chest wall: No tenderness.  Abdominal:     General: There is no distension.     Tenderness: There is no abdominal tenderness. There is no rebound.  Musculoskeletal:        General: Normal range of motion.     Cervical back: Neck supple.  Lymphadenopathy:     Cervical: No cervical adenopathy.  Skin:    Findings: No erythema or rash.  Neurological:     Motor: No abnormal muscle tone.     Coordination: Coordination normal.     Comments: Oriented to person only  Psychiatric:        Behavior: Behavior normal.     (all labs ordered are listed, but only abnormal results are displayed) Labs Reviewed  CBC WITH DIFFERENTIAL/PLATELET - Abnormal; Notable for the following components:      Result Value   Platelets 112 (*)    nRBC 0.9 (*)    Abs Immature Granulocytes 0.10 (*)    Abs Granulocyte 7.0 (*)    All other components within normal limits  COMPREHENSIVE METABOLIC PANEL WITH GFR - Abnormal; Notable for the following components:   Sodium 128 (*)    Potassium 5.7 (*)    Chloride 93 (*)    CO2 18 (*)    Glucose, Bld 106 (*)    BUN 76 (*)    Creatinine, Ser 2.52 (*)    Calcium  7.9 (*)    Total Protein 6.3 (*)    Albumin 3.2 (*)    AST 65 (*)    ALT 64 (*)    Total Bilirubin 1.8 (*)    GFR, Estimated 18 (*)    Anion gap 17 (*)    All other components within normal limits  BASIC METABOLIC PANEL WITH GFR - Abnormal; Notable for the  following components:   Sodium 133 (*)    CO2 18 (*)    Glucose, Bld 105 (*)    BUN 54 (*)    Creatinine, Ser 1.50 (*)    Calcium  7.9 (*)    GFR, Estimated 34 (*)  All other components within normal limits  URINALYSIS, ROUTINE W REFLEX MICROSCOPIC    EKG: None  Radiology: [facility] Chest Port 1 View Result Date: 06/12/2024 EXAM: 1 VIEW XRAY OF THE CHEST 06/12/2024 01:34:00 PM COMPARISON: AP radiograph of the chest dated 06/08/2024. CLINICAL HISTORY: SOB. Pt. In today from [facility]. Pt. Fell on 06/08/2024 with no injuries but was at followup appointment at doctors office. Family states to EMS that she is more lethargic. Pt. States I just don't feel well. Pt. Does have Afib (controlled). Pt. Is alert now but only oriented to self and place. Hx of hypertension, AFIB. Non smoker. FINDINGS: LUNGS AND PLEURA: Prominent interstitial opacities which appear chronic. No focal pulmonary opacity. No pulmonary edema. No pleural effusion. No pneumothorax. HEART AND MEDIASTINUM: The heart is mildly enlarged. There is calcification within the aortic arch. No acute abnormality of the cardiac and mediastinal silhouettes. BONES AND SOFT TISSUES: No acute osseous abnormality. IMPRESSION: 1. No acute process. 2. Mildly enlarged heart. 3. Prominent interstitial opacities, likely chronic. Electronically signed by: evalene coho 06/12/2024 02:26 PM EDT RP Workstation: [id number]   CT Head Wo Contrast Result Date: 06/12/2024 EXAM: CT HEAD WITHOUT CONTRAST 06/12/2024 01:53:47 PM TECHNIQUE: CT of the head was performed without the administration of intravenous contrast. Automated exposure control, iterative reconstruction, and/or weight based adjustment of the mA/kV was utilized to reduce the radiation dose to as low as reasonably achievable. COMPARISON: CT of the head dated 09/08/2024. CLINICAL HISTORY: Head trauma, moderate-severe. Fall 06/08/2024, patient states she doesn't feel right. FINDINGS: BRAIN AND  VENTRICLES: No acute hemorrhage. Gray-white differentiation is preserved. No hydrocephalus. No extra-axial collection. No mass effect or midline shift. A curvilinear dystrophic calcification is again demonstrated within the left occipital lobe. There is age-related cerebral volume loss and there is mild periventricular white matter disease. ORBITS: The patient is status post bilateral lens replacement. SINUSES: There is mucosal disease within the left maxillary sinus. SOFT TISSUES AND SKULL: No acute soft tissue abnormality. No skull fracture. VASCULATURE: There is moderate chronic atheromatous disease within the carotid siphons and vertebral arteries. IMPRESSION: 1. No acute intracranial abnormality related to the reported head trauma. 2. Curvilinear dystrophic calcification within the left occipital lobe, age-related cerebral volume loss, and mild periventricular white matter disease. 3. Moderate chronic atheromatous disease within the carotid siphons and vertebral arteries. 4. Mucosal disease within the left maxillary sinus. Electronically signed by: evalene coho 06/12/2024 02:25 PM EDT RP Workstation: [id number]     Procedures   Medications Ordered in the ED  aspirin  EC tablet 81 mg (81 mg Oral Given 06/13/24 0937)  traZODone  (DESYREL ) tablet 50 mg (has no administration in time range)  heparin  injection 5,000 Units (5,000 Units Subcutaneous Given 06/13/24 0552)  0.9 %  sodium chloride  infusion ( Intravenous New Bag/Given 06/13/24 0948)  acetaminophen  (TYLENOL ) tablet 650 mg (650 mg Oral Given 06/13/24 0948)    Or  acetaminophen  (TYLENOL ) suppository 650 mg ( Rectal See Alternative 06/13/24 0948)  ondansetron  (ZOFRAN ) tablet 4 mg (has no administration in time range)    Or  ondansetron  (ZOFRAN ) injection 4 mg (has no administration in time range)  polyethylene glycol (MIRALAX  / GLYCOLAX ) packet 17 g (has no administration in time range)  HYDROcodone -acetaminophen  (NORCO/VICODIN) 5-325 MG per tablet 1  tablet (1 tablet Oral Given 06/13/24 1128)  feeding supplement (ENSURE PLUS HIGH PROTEIN) liquid 237 mL (237 mLs Oral Given 06/13/24 0937)  diltiazem  (CARDIZEM  CD) 24 hr capsule 240 mg (240 mg Oral Given 06/13/24 0937)  metoprolol   succinate (TOPROL -XL) 24 hr tablet 100 mg (100 mg Oral Given 06/13/24 0938)  midodrine  (PROAMATINE ) tablet 2.5 mg (2.5 mg Oral Given 06/13/24 1128)  sodium chloride  0.9 % bolus 1,000 mL (0 mLs Intravenous Stopped 06/12/24 1624)  sodium chloride  0.9 % bolus 1,000 mL (1,000 mLs Intravenous New Bag/Given 06/12/24 1624)                                    Medical Decision Making Amount and/or Complexity of Data Reviewed Labs: ordered. Radiology: ordered.  Risk Decision regarding hospitalization.  Patient with AKI.  She will be admitted to medicine     Final diagnoses:  AKI (acute kidney injury) [facility])    ED Discharge Orders     None          Suzette Pac, MD 06/13/24 1336

## 2024-06-13 NOTE — Care Management Obs Status (Signed)
 MEDICARE OBSERVATION STATUS NOTIFICATION   Patient Details  Name: Norma Barajas MRN: [id number] Date of Birth: 1941-06-17   Medicare Observation Status Notification Given:  Yes    Duwaine LITTIE Ada 06/13/2024, 4:26 PM

## 2024-06-14 ENCOUNTER — Encounter (HOSPITAL_COMMUNITY): Payer: Self-pay | Admitting: Internal Medicine

## 2024-06-14 DIAGNOSIS — N179 Acute kidney failure, unspecified: Secondary | ICD-10-CM | POA: Diagnosis not present

## 2024-06-14 DIAGNOSIS — Z743 Need for continuous supervision: Secondary | ICD-10-CM | POA: Diagnosis not present

## 2024-06-14 DIAGNOSIS — R531 Weakness: Secondary | ICD-10-CM | POA: Diagnosis not present

## 2024-06-14 LAB — COMPREHENSIVE METABOLIC PANEL WITH GFR
ALT: 46 U/L — ABNORMAL HIGH (ref 0–44)
AST: 31 U/L (ref 15–41)
Albumin: 2.8 g/dL — ABNORMAL LOW (ref 3.5–5.0)
Alkaline Phosphatase: 71 U/L (ref 38–126)
Anion gap: 10 (ref 5–15)
BUN: 28 mg/dL — ABNORMAL HIGH (ref 8–23)
CO2: 17 mmol/L — ABNORMAL LOW (ref 22–32)
Calcium: 8 mg/dL — ABNORMAL LOW (ref 8.9–10.3)
Chloride: 104 mmol/L (ref 98–111)
Creatinine, Ser: 0.76 mg/dL (ref 0.44–1.00)
GFR, Estimated: >60 mL/min (ref >60)
Glucose, Bld: 124 mg/dL — ABNORMAL HIGH (ref 70–99)
Potassium: 4.3 mmol/L (ref 3.5–5.1)
Sodium: 131 mmol/L — ABNORMAL LOW (ref 135–145)
Total Bilirubin: 1.1 mg/dL (ref 0.0–1.2)
Total Protein: 5.6 g/dL — ABNORMAL LOW (ref 6.5–8.1)

## 2024-06-14 MED ORDER — ACETAMINOPHEN 325 MG PO TABS: 650.0000 mg | ORAL_TABLET | Freq: Four times a day (QID) | ORAL | Status: DC | PRN

## 2024-06-14 MED ORDER — TORSEMIDE 20 MG PO TABS: 20.0000 mg | ORAL_TABLET | ORAL | 3 refills | Status: DC

## 2024-06-14 MED ORDER — ASPIRIN 81 MG PO TBEC: 81.0000 mg | DELAYED_RELEASE_TABLET | Freq: Every day | ORAL | 5 refills | Status: DC

## 2024-06-14 MED ORDER — ENSURE PLUS HIGH PROTEIN PO LIQD: 237.0000 mL | Freq: Two times a day (BID) | ORAL | 4 refills | Status: DC

## 2024-06-14 NOTE — Plan of Care (Signed)

## 2024-06-14 NOTE — Discharge Summary (Addendum)
 Norma Barajas, is a 83 y.o. female  DOB [birth year]  MRN [id number].  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 Nonobstructive minimal CAD 10/2005.   Depression    Diastolic dysfunction    Grade 1. Ejection fraction 60-65%.   Dysrhythmia    a fib   Erosive esophagitis    Essential hypertension    Fracture of ramus of right pubis with routine healing 12/09/2020   GERD (gastroesophageal reflux disease)    Grade III hemorrhoids    History of hiatal hernia    History of prediabetes    Hypercholesterolemia    Internal hemorrhoids with complication 07/29/2008   OCT 2015 FLEX SIG/IH BANDING     Intertrochanteric fracture of femur (HCC)    5/16 - 03/26/2021 fracture sustained in a mechanical fall.  IM nailing performed 5/17 by Dr Oneil Horde Warfarin held because of posttraumatic pelvic hematoma.  Postop DVT with 81 mg aspirin  twice daily x28 days.   Major depressive disorder, recurrent, moderate (HCC) 12/09/2020   Migraine     used to have them right bad; I don't now (04/11/2015)   MITRAL REGURGITATION 04/27/2010   Qualifier: Diagnosis of  By: Delford, MD, CODY Maude Dunnings    Osteoporosis    Paroxysmal atrial fibrillation [facility])    Pelvic hematoma, female 03/27/2021   Sustained in mechanical fall.  Warfarin held.  IM nailing performed 5/17 with postop DVT prophylaxis 81 mg twice daily x28 days.   PONV (postoperative nausea and vomiting)    Vertigo     Past Surgical History:  Procedure Laterality Date   BRAVO [facility] STUDY  11/15/2012   Procedure: BRAVO PH STUDY;  Surgeon: Margo LITTIE Haddock, MD;  Location: [facility] ENDO SUITE;  Service: Endoscopy;;   CARDIOVERSION N/A 05/16/2020   Procedure: CARDIOVERSION;  Surgeon: Kate Lonni LITTIE, MD;  Location: [facility] ENDOSCOPY;  Service: Cardiovascular;  Laterality: N/A;   CATARACT EXTRACTION W/ INTRAOCULAR LENS  IMPLANT, BILATERAL Bilateral    COLONOSCOPY  2008   Dr. Haddock: internal hemorrhoids    DILATION AND CURETTAGE OF UTERUS  ESOPHAGEAL DILATION N/A 06/16/2021   Procedure: ESOPHAGEAL DILATION;  Surgeon: Eartha Angelia Sieving, MD;  Location: [facility] ENDO SUITE;  Service: Gastroenterology;  Laterality: N/A;   ESOPHAGOGASTRODUODENOSCOPY (EGD) WITH ESOPHAGEAL DILATION  2001   Dr. Debrah: erosive esophagitis, esophageal stricture, duodenitis, s/p Savary dilation   ESOPHAGOGASTRODUODENOSCOPY (EGD) WITH ESOPHAGEAL DILATION  11/15/2012   DOQ:Zdneyjhzjo web was found & MOST LIKELY CAUSE FOR DYAPHAGIA/Polyp was found in the gastric body and gastric fundus/ gastritis on bx   ESOPHAGOGASTRODUODENOSCOPY (EGD) WITH PROPOFOL  N/A 06/16/2021   Procedure: ESOPHAGOGASTRODUODENOSCOPY (EGD) WITH PROPOFOL ;  Surgeon: Eartha Angelia Sieving, MD;  Location[facility] END[facility];  Service: Gastroenterology;  Laterality: N/A;  8:15   EYE SURGERY Bilateral    laser OR after cataract OR; cause I couldn't see   FLEXIBLE SIGMOIDOSCOPY N/A 08/22/2014   mild diverticulosis in sigmoid, moderate sized Grade 3  hemorrhoids s/p banding X 3.    FRACTURE SURGERY Right    below the knee - 2 bones broke has plates   HEMORRHOID BANDING N/A 08/22/2014   Procedure: HEMORRHOID BANDING;  Surgeon: Margo LITTIE Haddock, MD;  [facility];  Service: Endoscopy;  Laterality: N/A;   HEMORRHOID SURGERY N/A 03/30/2018   Procedure: EXTENSIVE HEMORRHOIDECTOMY;  Surgeon: Kallie Manuelita [name], MD;  Location: [facility] ORS;  Service: General;  Laterality: N/A;   INTRAMEDULLARY (IM) NAIL INTERTROCHANTERIC Right 03/24/2021   Procedure: INTRAMEDULLARY (IM) NAIL INTERTROCHANTRIC;  Surgeon: Onesimo Oneil LABOR, MD;  Location: [facility] ORS;  Service: Orthopedics;  Laterality: Right;   OPEN REDUCTION INTERNAL FIXATION (ORIF) TIBIA/FIBULA FRACTURE Right 2013   broke tibia and fibula after falling down stairs   PROLAPSED UTERINE FIBROID LIGATION  2015   TOTAL ABDOMINAL HYSTERECTOMY       HPI  from the history and physical done on the day of admission:   Chief Complaint: Confusion, lethargy   HPI: Norma Barajas is a 83 y.o. female with medical history significant for paroxysmal atrial fibrillation, CHF, coronary artery disease, hypertension. Patient was brought to the ED from ALF for reports of confused speech, lethargy and poor oral intake.     Patient had a mechanical fall on Friday 8/1-she uses a wheelchair at baseline, but she attempted to walk independently to pick up her phone sitting next to her TV when her leg gave out and she fell onto her right hip. She came to the ED imaging included pelvic x-ray that suggested probable acute right inferior pubic rami fracture, subsequent CT pelvis was negative for fractures.  She also had head CT, cervical CT, right knee x-ray, chest x-ray unremarkable.  She was discharged back to the ALF.   But since getting back, she has been complaining of pain, she stopped eating or drinking, and today she was lethargic and confused.     ED Course: Temperature 97.7.  Heart rate 111.  Respiratory 26.  Blood pressure  170/82.  O2 sats greater than 92% on room air. Creatinine elevated 2.52.  Sodium 128.   UA not suggestive of UTI.  Chest x-ray and head CT negative for acute abnormality. 2 L bolus given.  Hospitalist admit for AKI.   Review of Systems: As per HPI all other systems reviewed and negative.   Hospital Course:     Brief Narrative:  83 y.o. female with medical history significant for paroxysmal atrial fibrillation, CHF, coronary artery disease, hypertension admitted from [facility]F on 06/12/2024 with AKI and acute metabolic encephalopathy     -Assessment and Plan: 1)AKI (acute kidney injury) (  HCC) Creatinine 2.52, baseline 0.8-0.9.  Likely due to dehydration from poor oral intake over the past few days.  Also on torsemide  20 mg BID. Creatinine down to 0.76 with hydration -Repeat CMP within a week -Encourage adequate oral intake, give boost or Ensure supplements -- renally adjust medications, avoid nephrotoxic agents / dehydration  / hypotension   2)Acute metabolic encephalopathy Presenting with confusion and lethargy.  Likely secondary to uncontrolled pain and resultant dehydration from poor oral intake.  Head CT negative for acute abnormality.  UA not suggestive of infection, chest x-ray clear. - Mentation improved significantly with hydration and improvement in renal parameters   3)Falls/Generalized Weakness Recent mechanical fall 06/08/24  Ambulates with wheelchair at baseline.  ALF resident.  Recent imaging included head CT, cervical CT, CT pelvis, right knee x-ray, negative for acute abnormality. - PT eval appreciated -Will discharge back to ALF with home health physical therapy services   4)Afib [facility]) Follows with Dr. Lavona- per cardiology notes 10/2023-has failed multiple attempts at cardioversion, too frail l for Watchman, not a good candidate for anticoagulation due to history of falls. -Resume aspirin , diltiazem  and metoprolol -    5)Chronic congestive heart failure with  right ventricular diastolic dysfunction (HCC) Stable and compensated.  Presenting with dehydration and AKI.  Last echo 09/2023 EF of 55 to 60%. - Hold torsemide  --Hydrate iv and orally as above   6)HypoNatremia---due to dehydration, improving with hydration   7)Hyperkalemia----resolved    8)Transaminitis---?? Due to shock Liver (pt had transient hypotension due to dehydration) -- Improving Repeat CMP in 1 week     Latest Ref Rng & Units 06/14/2024    9:51 AM 06/12/2024    2:10 PM 03/01/2024    9:31 AM  Hepatic Function  Total Protein 6.5 - 8.1 g/dL 5.6  6.3  6.7   Albumin 3.5 - 5.0 g/dL 2.8  3.2  4.3   AST 15 - 41 U/L 31  65  22   ALT 0 - 44 U/L 46  64  22   Alk Phosphatase 38 - 126 U/L 71  77  157   Total Bilirubin 0.0 - 1.2 mg/dL 1.1  1.8  0.4     Disposition: The patient is from: ALF              Anticipated d/c is to: ALF Vs SNF  Discharge Condition: stable  Follow UP   Follow-up Information     Severa Rock HERO, FNP. Schedule an appointment as soon as possible for a visit in 1 week(s).   Specialty: Family Medicine Why: Repeat CMP in 1 week Contact information: [address] [place] [address] [phone]                 Diet and Activity recommendation:  As advised  Discharge Instructions    Discharge Instructions     Call MD for:  difficulty breathing, headache or visual disturbances   Complete by: As directed    Call MD for:  persistant dizziness or light-headedness   Complete by: As directed    Call MD for:  persistant nausea and vomiting   Complete by: As directed    Call MD for:  temperature >100.4   Complete by: As directed    Diet general   Complete by: As directed    Discharge instructions   Complete by: As directed    1)Repeat CMP in 1 week 2) encourage adequate oral intake--- please give nutritional supplements including Ensure or boost  Increase activity slowly   Complete by: As directed        Discharge Medications      Allergies as of 06/14/2024       Reactions   Codeine Nausea And Vomiting   Triamterene-hctz Other (See Comments)   Weakness    Atorvastatin Other (See Comments)   Myalgias   Crestor [rosuvastatin Calcium ] Other (See Comments)   Weakness    Doxycycline  Other (See Comments)   Chest congestion   Morphine  Nausea Only   Risedronate Sodium Other (See Comments)   ACTONEL - reflux        Medication List     STOP taking these medications    acetaminophen  650 MG CR tablet Commonly known as: Tylenol  8 Hour Replaced by: acetaminophen  325 MG tablet   naproxen  375 MG tablet Commonly known as: NAPROSYN        TAKE these medications    acetaminophen  325 MG tablet Commonly known as: TYLENOL  Take 2 tablets (650 mg total) by mouth every 6 (six) hours as needed for mild pain (pain score 1-3) or fever (or Fever >/= 101). Replaces: acetaminophen  650 MG CR tablet   aluminum-magnesium  hydroxide 200-200 MG/5ML suspension Take 30 mLs by mouth as needed for indigestion (heartburn). Up to 4 times a day in 24 hours.   aspirin  EC 81 MG tablet Commonly known as: Aspirin  Low Dose Take 1 tablet (81 mg total) by mouth daily with breakfast. Swallow whole. What changed: See the new instructions.   cholecalciferol  25 MCG (1000 UNIT) tablet Commonly known as: VITAMIN D3 Take 1,000 Units by mouth in the morning.   diltiazem  240 MG 24 hr capsule Commonly known as: CARDIZEM  CD Take 1 capsule (240 mg total) by mouth daily.   feeding supplement Liqd Take 237 mLs by mouth 2 (two) times daily between meals.   meclizine  25 MG tablet Commonly known as: ANTIVERT  Take 1 tablet (25 mg total) by mouth 3 (three) times daily as needed for dizziness.   metoprolol  succinate 100 MG 24 hr tablet Commonly known as: TOPROL -XL Take 1 tablet (100 mg total) by mouth 2 (two) times daily. Take with or immediately following a meal.   midodrine  2.5 MG tablet Commonly known as: PROAMATINE  Take 1 tablet (2.5 mg  total) by mouth 2 (two) times daily with a meal.   omeprazole  20 MG capsule Commonly known as: PRILOSEC Take 1 capsule (20 mg total) by mouth daily.   ondansetron  4 MG tablet Commonly known as: Zofran  Take 1 tablet (4 mg total) by mouth every 8 (eight) hours as needed for nausea or vomiting.   polyethylene glycol 17 g packet Commonly known as: MIRALAX  / GLYCOLAX  Take 17 g by mouth 2 (two) times daily as needed for mild constipation.   potassium chloride  10 MEQ tablet Commonly known as: KLOR-CON  M TAKE 1 TABLET EVERY DAY (TAKE WHILE TAKING DEMADEX /TORSEMIDE  AS DIRECTED)   simvastatin  20 MG tablet Commonly known as: ZOCOR  Take 1 tablet (20 mg total) by mouth daily. What changed: when to take this   torsemide  20 MG tablet Commonly known as: DEMADEX  Take 1 tablet (20 mg total) by mouth every Monday, Wednesday, and Friday. Start taking on: June 15, 2024 What changed:  when to take this additional instructions   traZODone  50 MG tablet Commonly known as: DESYREL  Take 1 tablet (50 mg total) by mouth at bedtime as needed for sleep.       Major procedures and Radiology Reports - PLEASE review detailed and final reports  for all details, in brief -   DG Chest Port 1 View Result Date: 06/12/2024 EXAM: 1 VIEW XRAY OF THE CHEST 06/12/2024 01:34:00 PM COMPARISON: [facility] radiograph of the chest dated 06/08/2024. CLINICAL HISTORY: SOB. Pt. In today from [facility]. Pt. Fell on 06/08/2024 with no injuries but was at followup appointment at doctors office. Family states to EMS that she is more lethargic. Pt. States I just don't feel well. Pt. Does have Afib (controlled). Pt. Is alert now but only oriented to self and place. Hx of hypertension, AFIB. Non smoker. FINDINGS: LUNGS AND PLEURA: Prominent interstitial opacities which appear chronic. No focal pulmonary opacity. No pulmonary edema. No pleural effusion. No pneumothorax. HEART AND MEDIASTINUM: The heart is mildly enlarged. There is  calcification within the aortic arch. No acute abnormality of the cardiac and mediastinal silhouettes. BONES AND SOFT TISSUES: No acute osseous abnormality. IMPRESSION: 1. No acute process. 2. Mildly enlarged heart. 3. Prominent interstitial opacities, likely chronic. Electronically signed by: evalene coho 06/12/2024 02:26 PM EDT RP Workstation: [id number]   CT Head Wo Contrast Result Date: 06/12/2024 EXAM: CT HEAD WITHOUT CONTRAST 06/12/2024 01:53:47 PM TECHNIQUE: CT of the head was performed without the administration of intravenous contrast. Automated exposure control, iterative reconstruction, and/or weight based adjustment of the mA/kV was utilized to reduce the radiation dose to as low as reasonably achievable. COMPARISON: CT of the head dated 09/08/2024. CLINICAL HISTORY: Head trauma, moderate-severe. Fall 06/08/2024, patient states she doesn't feel right. FINDINGS: BRAIN AND VENTRICLES: No acute hemorrhage. Gray-white differentiation is preserved. No hydrocephalus. No extra-axial collection. No mass effect or midline shift. A curvilinear dystrophic calcification is again demonstrated within the left occipital lobe. There is age-related cerebral volume loss and there is mild periventricular white matter disease. ORBITS: The patient is status post bilateral lens replacement. SINUSES: There is mucosal disease within the left maxillary sinus. SOFT TISSUES AND SKULL: No acute soft tissue abnormality. No skull fracture. VASCULATURE: There is moderate chronic atheromatous disease within the carotid siphons and vertebral arteries. IMPRESSION: 1. No acute intracranial abnormality related to the reported head trauma. 2. Curvilinear dystrophic calcification within the left occipital lobe, age-related cerebral volume loss, and mild periventricular white matter disease. 3. Moderate chronic atheromatous disease within the carotid siphons and vertebral arteries. 4. Mucosal disease within the left maxillary sinus.  Electronically signed by: evalene coho 06/12/2024 02:25 PM EDT RP Workstation: [id number]   DG Chest Portable 1 View Result Date: 06/08/2024 CLINICAL DATA:  Unwitnessed fall, tachypnea. EXAM: PORTABLE CHEST 1 VIEW COMPARISON:  November 17, 2023. FINDINGS: Stable cardiomediastinal silhouette. Minimal bibasilar subsegmental atelectasis or scarring is noted. Bony thorax is unremarkable. IMPRESSION: Minimal bibasilar subsegmental atelectasis or scarring. Electronically Signed   By: Lynwood Landy Raddle M.D.   On: 06/08/2024 14:00   CT PELVIS WO CONTRAST Result Date: 06/08/2024 CLINICAL DATA:  Unwitnessed fall.  Left groin pain. EXAM: CT PELVIS WITHOUT CONTRAST TECHNIQUE: Multidetector CT imaging of the pelvis was performed following the standard protocol without intravenous contrast. RADIATION DOSE REDUCTION: This exam was performed according to the departmental dose-optimization program which includes automated exposure control, adjustment of the mA and/or kV according to patient size and/or use of iterative reconstruction technique. COMPARISON:  September 15, 2023. FINDINGS: Urinary Tract:  Urinary bladder is unremarkable. Bowel:  Unremarkable visualized pelvic bowel loops. Vascular/Lymphatic: Aortic atherosclerosis.  No adenopathy. Reproductive:  Status post hysterectomy. Other:  Minimal cholelithiasis.  No definite ascites or hernia. Musculoskeletal: Old bilateral pubic rami fractures are noted. Status post surgical internal  fixation of old proximal right femoral fracture. No acute fracture or dislocation is noted. IMPRESSION: No acute abnormality seen.  Stable chronic findings as noted above. Aortic Atherosclerosis (ICD10-I70.0). Electronically Signed   By: Lynwood Landy Raddle M.D.   On: 06/08/2024 12:24   DG Knee Complete 4 Views Right Result Date: 06/08/2024 CLINICAL DATA:  Fall. EXAM: RIGHT KNEE - COMPLETE 4+ VIEW COMPARISON:  None Available. FINDINGS: Osseous structures are osteopenic. Status post ORIF with plate  and screw fixation of the tibia and medullary rod the femur. Joint spaces are maintained. There is a small suprapatellar effusion. No acute fracture. IMPRESSION: Osteopenia. Small effusion. No acute osseous abnormalities. Electronically Signed   By: Fonda Field M.D.   On: 06/08/2024 11:21   DG Hip Unilat W or Wo Pelvis 2-3 Views Right Result Date: 06/08/2024 CLINICAL DATA:  Fall. EXAM: DG HIP (WITH OR WITHOUT PELVIS) 2-3V RIGHT COMPARISON:  None Available. FINDINGS: There is probable acute fracture of the right inferior pubic ramus, marked with electronic arrow sign on image 1. Previously seen old healed fractures of left superior pubic ramus and bilateral inferior pubic rami are again seen. No other acute fracture or dislocation. No aggressive osseous lesion. Visualized sacral arcuate lines are unremarkable. Unremarkable symphysis pubis. There are mild degenerative changes of bilateral hip joints characterized by mildly reduced joint space and osteophytosis of the superior acetabulum. Stable appearance of right proximal femur hardware. No radiopaque foreign bodies. IMPRESSION: Probable acute fracture of the right inferior pubic ramus. Electronically Signed   By: Ree Molt M.D.   On: 06/08/2024 10:46   CT Cervical Spine Wo Contrast Result Date: 06/08/2024 CLINICAL DATA:  83 year old female status post unwitnessed fall this morning. Pain. EXAM: CT CERVICAL SPINE WITHOUT CONTRAST TECHNIQUE: Multidetector CT imaging of the cervical spine was performed without intravenous contrast. Multiplanar CT image reconstructions were also generated. RADIATION DOSE REDUCTION: This exam was performed according to the departmental dose-optimization program which includes automated exposure control, adjustment of the mA and/or kV according to patient size and/or use of iterative reconstruction technique. COMPARISON:  Head CT today. FINDINGS: Alignment: Exaggerated cervical lordosis. Cervicothoracic junction alignment is  within normal limits. Bilateral posterior element alignment is within normal limits. Skull base and vertebrae: Diffuse osteopenia.  Mild motion artifact. Visualized skull base is intact. No atlanto-occipital dissociation. C1 and C2 appear intact and aligned. No acute osseous abnormality identified. Soft tissues and spinal canal: No prevertebral fluid or swelling. No visible canal hematoma. Negative visible noncontrast neck soft tissues aside from calcified carotid atherosclerosis. Disc levels: Pronounced osteopenia but mild for age cervical spine degeneration. Upper chest: Osteopenia continues into the visible upper thoracic levels. Negative lung apices. Partially visible aortic arch calcified atherosclerosis. IMPRESSION: Osteopenia. No acute traumatic injury identified in the cervical spine. Electronically Signed   By: VEAR Hurst M.D.   On: 06/08/2024 10:21   CT Head Wo Contrast Result Date: 06/08/2024 CLINICAL DATA:  83 year old female status post unwitnessed fall this morning. Pain. EXAM: CT HEAD WITHOUT CONTRAST TECHNIQUE: Contiguous axial images were obtained from the base of the skull through the vertex without intravenous contrast. RADIATION DOSE REDUCTION: This exam was performed according to the departmental dose-optimization program which includes automated exposure control, adjustment of the mA and/or kV according to patient size and/or use of iterative reconstruction technique. COMPARISON:  Brain MRI 07/01/2023.  Head CT 04/13/2016. FINDINGS: Brain: Chronic dystrophic calcification in the left occipital lobe stable since 2017. No midline shift, ventriculomegaly, mass effect, evidence of mass lesion,  intracranial hemorrhage or evidence of cortically based acute infarction. Mild for age periventricular white matter hypodensity, stable from MRI last year. Vascular: Calcified atherosclerosis at the skull base. No suspicious intracranial vascular hyperdensity. Skull: Osteopenia. Stable since 2017. No acute  osseous abnormality identified. Sinuses/Orbits: Visualized paranasal sinuses and mastoids are stable and well aerated. Other: No orbit or scalp acute soft tissue injury identified. Scalp venous varices similar to the MRI last year. IMPRESSION: 1. No acute intracranial abnormality or acute traumatic injury identified. 2. 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 Electronically Signed   By: VEAR Hurst M.D.   On: 06/08/2024 10:19   Today   Subjective    Norma Barajas today has no new complaints No fever  Or chills   No Nausea, Vomiting or Diarrhea - Oral intake is not great  Patient has been seen and examined prior to discharge   Objective   Blood pressure 127/74, pulse 84, temperature (!) 97.4 F (36.3 C), temperature source Oral, resp. rate 16, height 4' 8 (1.422 m), weight 45.8 kg, SpO2 97%.   Intake/Output Summary (Last 24 hours) at 06/14/2024 1241 Last data filed at 06/14/2024 9077 Gross per 24 hour  Intake 777.77 ml  Output 500 ml  Net 277.77 ml   Exam Gen:- Awake Alert, no acute distress  HEENT:- [place].AT, No sclera icterus Neck-Supple Neck,No JVD,.  Lungs-  CTAB , good air movement bilaterally CV- S1, S2 normal, regular Abd-  +ve B.Sounds, Abd Soft, No tenderness,    Extremity/Skin:- No  edema,   good pulses Psych-affect is appropriate, oriented x3 Neuro-generalized weakness, no new focal deficits, no tremors    Data Review   CBC w Diff:  Lab Results  Component Value Date   WBC 8.8 06/12/2024   HGB 14.1 06/12/2024   HGB 15.4 03/01/2024   HCT 40.2 06/12/2024   HCT 46.1 03/01/2024   PLT 112 (L) 06/12/2024   PLT 202 03/01/2024   LYMPHOPCT 8 06/12/2024   MONOPCT 12 06/12/2024   EOSPCT 0 06/12/2024   BASOPCT 0 06/12/2024   CMP:  Lab Results  Component Value Date   NA 131 (L) 06/14/2024   NA 141 03/01/2024   K 4.3 06/14/2024   CL 104 06/14/2024   CO2 17 (L) 06/14/2024   BUN 28 (H) 06/14/2024   BUN 20 03/01/2024   CREATININE  0.76 06/14/2024   CREATININE 0.87 07/15/2020   PROT 5.6 (L) 06/14/2024   PROT 6.7 03/01/2024   ALBUMIN 2.8 (L) 06/14/2024   ALBUMIN 4.3 03/01/2024   BILITOT 1.1 06/14/2024   BILITOT 0.4 03/01/2024   ALKPHOS 71 06/14/2024   AST 31 06/14/2024   ALT 46 (H) 06/14/2024   Total Discharge time is about 33 minutes  Rendall Carwin M.D on 06/14/2024 at 12:41 PM  Go to [email or web address] -  for contact info  [facility] - Office  [phone]

## 2024-06-14 NOTE — Discharge Instructions (Signed)
 1)Repeat CMP in 1 week 2) encourage adequate oral intake--- please give nutritional supplements including Ensure or boost

## 2024-06-14 NOTE — TOC Transition Note (Signed)
 Transition of Care [facility]) - Discharge Note   Patient Details  Name: Norma Barajas MRN: [id number] Date of Birth: 14-Jun-1941  Transition of Care [facility]) CM/SW Contact:  Hoy DELENA Bigness, LCSW Phone Number: 06/14/2024, 12:59 PM   Clinical Narrative:    Pt to return to her LTC facility at [facility] [place] [facility]. DC summary and FL-2 have been faxed to facility for review at [phone]. Pt recommended for HHPT and HH has been arranged with [facility]. Spoke with Nathanel Jerry at facility and confirmed pt's return.  RN to call report to main number at [phone] or to administrator, Nathanel Jerry at [phone]. Medical necessity form completed. EMS called for transport at 12:58pm.    Final next level of care: Assisted Living Barriers to Discharge: Barriers Resolved   Patient Goals and CMS Choice Patient states their goals for this hospitalization and ongoing recovery are:: return to ALF CMS Medicare.gov Compare Post Acute Care list provided to:: Patient Choice offered to / list presented to : Patient [place] ownership interest in [facility].provided to::  (n/a)    Discharge Placement                Patient to be transferred to facility by: [facility]      Discharge Plan and Services Additional resources added to the After Visit Summary for   In-house Referral: Clinical Social Work   Post Acute Care Choice: Resumption of Svcs/PTA Provider          DME Arranged: N/A         HH Arranged: PT HH Agency: [facility] Date [facility] Agency Contacted: 06/14/24 Time HH Agency Contacted: [phone] Representative spoke with at [facility] Agency: Joane  Social Drivers of Health (SDOH) Interventions SDOH Screenings   Food Insecurity: No Food Insecurity (06/12/2024)  Housing: Low Risk  (06/12/2024)  Transportation Needs: No Transportation Needs (06/12/2024)  Utilities: Not At Risk (06/12/2024)  Depression (PHQ2-9): Low Risk  (05/27/2023)  Financial Resource Strain: Low Risk  (07/04/2023)   Physical Activity: Unknown (07/04/2023)  Social Connections: Socially Isolated (06/12/2024)  Stress: Stress Concern Present (07/04/2023)  Tobacco Use: Low Risk  (06/14/2024)     Readmission Risk Interventions     No data to display

## 2024-06-14 NOTE — NC FL2 (Signed)
 [place]  MEDICAID FL2 LEVEL OF CARE FORM     IDENTIFICATION  Patient Name: Norma Barajas Birthdate: 04/17/1941 Sex: female Admission Date (Current Location): 06/12/2024  [facility] [place] and [place] Number:  [employer] and Address:  [facility],  [address] [place] [address]      Provider Number: [id number]  Attending Physician Name and Address:  Pearlean Manus, MD  Relative Name and Phone Number:  Pleas Coy (Niece)  [phone]    Current Level of Care: Hospital Recommended Level of Care: Assisted Living Facility Prior Approval Number:    Date Approved/Denied:   PASRR Number:    Discharge Plan: Other (Comment) (ALF)    Current Diagnoses: Patient Active Problem List   Diagnosis Date Noted   AKI (acute kidney injury) (HCC) 06/12/2024   Acute metabolic encephalopathy 06/12/2024   Falls 06/12/2024   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   Stage 2 chronic kidney disease 03/01/2024   Vitamin B12 deficiency 09/01/2023   Pelvic fracture (HCC) 08/05/2023   Vertigo 07/01/2023   Afib (HCC) 04/27/2023   Iron  deficiency 11/03/2022   Nonspecific abnormal electrocardiogram (ECG) (EKG) 08/29/2022   Abnormal SPEP 02/07/2022   Degenerative disc disease, lumbar 12/20/2021   Hypertensive heart disease with chronic diastolic congestive heart failure (HCC) 03/27/2021   Gallbladder sludge 03/27/2021   Aortic atherosclerosis (HCC) 03/27/2021   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) 10/23/2020   Severe tricuspid valve regurgitation 10/23/2020   Hypokalemia 09/16/2020   Early satiety 07/15/2020   Difficulty sleeping 06/22/2019   Paroxysmal atrial fibrillation (HCC) 08/21/2015   Lumbar pain 08/07/2015   Vitamin D  deficiency 11/27/2014   Drug induced constipation 12/14/2013   HTN (hypertension) 07/24/2013   Coronary atherosclerosis of native coronary artery 07/24/2013   Dysphagia 09/28/2012   Pulmonary nodule 02/18/2011    Hyperlipidemia 07/29/2008   GAD (generalized anxiety disorder) 07/29/2008   Primary hypertension 07/29/2008   GERD 07/29/2008   Osteoporosis 07/29/2008    Orientation RESPIRATION BLADDER Height & Weight     Self, Place, Situation  Normal Incontinent Weight: 101 lb (45.8 kg) Height:  4' 8 (142.2 cm)  BEHAVIORAL SYMPTOMS/MOOD NEUROLOGICAL BOWEL NUTRITION STATUS      Incontinent Diet (regular)  AMBULATORY STATUS COMMUNICATION OF NEEDS Skin   Extensive Assist Verbally Bruising                       Personal Care Assistance Level of Assistance  Bathing, Feeding, Dressing Bathing Assistance: Maximum assistance Feeding assistance: Limited assistance Dressing Assistance: Maximum assistance     Functional Limitations Info  Sight, Hearing, Speech Sight Info: Impaired Hearing Info: Impaired Speech Info: Adequate    SPECIAL CARE FACTORS FREQUENCY  PT (By licensed PT)     PT Frequency: 2-3x/wk              Contractures Contractures Info: Not present    Additional Factors Info  Code Status, Allergies, Psychotropic Code Status Info: DNR Allergies Info: Codeine Triamterene-hctz Atorvastatin Crestor (Rosuvastatin Calcium ) Doxycycline  Morphine  Risedronate Sodium Psychotropic Info: Trazodone          Current Medications (06/14/2024):  This is the current hospital active medication list Current Facility-Administered Medications  Medication Dose Route Frequency Provider Last Rate Last Admin   acetaminophen  (TYLENOL ) tablet 650 mg  650 mg Oral Q6H PRN Emokpae, Ejiroghene E, MD   650 mg at 06/13/24 2301   Or   acetaminophen  (TYLENOL ) suppository 650 mg  650 mg Rectal Q6H PRN Emokpae, Ejiroghene  E, MD       aspirin  EC tablet 81 mg  81 mg Oral Daily Emokpae, Ejiroghene E, MD   81 mg at 06/14/24 0820   diltiazem  (CARDIZEM  CD) 24 hr capsule 240 mg  240 mg Oral Daily Emokpae, Ejiroghene E, MD   240 mg at 06/14/24 0820   feeding supplement (ENSURE PLUS HIGH PROTEIN) liquid 237  mL  237 mL Oral BID BM Emokpae, Ejiroghene E, MD   237 mL at 06/14/24 0824   heparin  injection 5,000 Units  5,000 Units Subcutaneous Q8H Emokpae, Ejiroghene E, MD   5,000 Units at 06/14/24 0530   HYDROcodone -acetaminophen  (NORCO/VICODIN) 5-325 MG per tablet 1 tablet  1 tablet Oral Q6H PRN Emokpae, Ejiroghene E, MD   1 tablet at 06/14/24 9176   metoprolol  succinate (TOPROL -XL) 24 hr tablet 100 mg  100 mg Oral BID Emokpae, Ejiroghene E, MD   100 mg at 06/14/24 0820   midodrine  (PROAMATINE ) tablet 2.5 mg  2.5 mg Oral BID WC Emokpae, Ejiroghene E, MD   2.5 mg at 06/14/24 0820   ondansetron  (ZOFRAN ) tablet 4 mg  4 mg Oral Q6H PRN Emokpae, Ejiroghene E, MD       Or   ondansetron  (ZOFRAN ) injection 4 mg  4 mg Intravenous Q6H PRN Emokpae, Ejiroghene E, MD       polyethylene glycol (MIRALAX  / GLYCOLAX ) packet 17 g  17 g Oral Daily PRN Emokpae, Ejiroghene E, MD       traZODone  (DESYREL ) tablet 50 mg  50 mg Oral QHS PRN Emokpae, Ejiroghene E, MD   50 mg at 06/13/24 2253     Discharge Medications: TAKE these medications     acetaminophen  325 MG tablet Commonly known as: TYLENOL  Take 2 tablets (650 mg total) by mouth every 6 (six) hours as needed for mild pain (pain score 1-3) or fever (or Fever >/= 101). Replaces: acetaminophen  650 MG CR tablet    aluminum-magnesium  hydroxide 200-200 MG/5ML suspension Take 30 mLs by mouth as needed for indigestion (heartburn). Up to 4 times a day in 24 hours.    aspirin  EC 81 MG tablet Commonly known as: Aspirin  Low Dose Take 1 tablet (81 mg total) by mouth daily with breakfast. Swallow whole. What changed: See the new instructions.    cholecalciferol  25 MCG (1000 UNIT) tablet Commonly known as: VITAMIN D3 Take 1,000 Units by mouth in the morning.    diltiazem  240 MG 24 hr capsule Commonly known as: CARDIZEM  CD Take 1 capsule (240 mg total) by mouth daily.    feeding supplement Liqd Take 237 mLs by mouth 2 (two) times daily between meals.    meclizine  25  MG tablet Commonly known as: ANTIVERT  Take 1 tablet (25 mg total) by mouth 3 (three) times daily as needed for dizziness.    metoprolol  succinate 100 MG 24 hr tablet Commonly known as: TOPROL -XL Take 1 tablet (100 mg total) by mouth 2 (two) times daily. Take with or immediately following a meal.    midodrine  2.5 MG tablet Commonly known as: PROAMATINE  Take 1 tablet (2.5 mg total) by mouth 2 (two) times daily with a meal.    omeprazole  20 MG capsule Commonly known as: PRILOSEC Take 1 capsule (20 mg total) by mouth daily.    ondansetron  4 MG tablet Commonly known as: Zofran  Take 1 tablet (4 mg total) by mouth every 8 (eight) hours as needed for nausea or vomiting.    polyethylene glycol 17 g packet Commonly known as: MIRALAX  /  GLYCOLAX  Take 17 g by mouth 2 (two) times daily as needed for mild constipation.    potassium chloride  10 MEQ tablet Commonly known as: KLOR-CON  M TAKE 1 TABLET EVERY DAY (TAKE WHILE TAKING DEMADEX /TORSEMIDE  AS DIRECTED)    simvastatin  20 MG tablet Commonly known as: ZOCOR  Take 1 tablet (20 mg total) by mouth daily. What changed: when to take this    torsemide  20 MG tablet Commonly known as: DEMADEX  Take 1 tablet (20 mg total) by mouth every Monday, Wednesday, and Friday. Start taking on: June 15, 2024 What changed:  when to take this additional instructions    traZODone  50 MG tablet Commonly known as: DESYREL  Take 1 tablet (50 mg total) by mouth at bedtime as needed for sleep.    Relevant Imaging Results:  Relevant Lab Results:   Additional Information SSN: [id number]  Hoy DELENA Bigness, LCSW

## 2024-06-14 NOTE — Evaluation (Addendum)
 Physical Therapy Evaluation Patient Details Name: Norma Barajas MRN: [id number] DOB: Jan 18, 1941 Today's Date: 06/14/2024  History of Present Illness  Norma Barajas is a 83 y.o. female with medical history significant for paroxysmal atrial fibrillation, CHF, coronary artery disease, hypertension.  Patient was brought to the ED from ALF for reports of confused speech, lethargy and poor oral intake.       Patient had a mechanical fall on Friday 8/1-she uses a wheelchair at baseline, but she attempted to walk independently to pick up her phone sitting next to her TV when her leg gave out and she fell onto her right hip.  She came to the ED imaging included pelvic x-ray that suggested probable acute right inferior pubic rami fracture, subsequent CT pelvis was negative for fractures.  She also had head CT, cervical CT, right knee x-ray, chest x-ray unremarkable.  She was discharged back to the ALF.     But since getting back, she has been complaining of pain, she stopped eating or drinking, and today she was lethargic and confused.   Clinical Impression  Patient functioning near baseline for functional mobility and gait requiring mod/maxA for bed mobility and is noted to navigate with a wheelchair and have much assist with ADLs at the facility. However, patient limited in today's evaluation due to refusal to complete functional transfers. Patient appears to present with some level of cognitive deficit that is also a limiting factor today. PLAN:  Patient to be discharged to her ALF today and discharged from acute physical therapy to care of nursing for ambulation as tolerated for length of stay with recommendations stated below        If plan is discharge home, recommend the following: A lot of help with walking and/or transfers;A lot of help with bathing/dressing/bathroom;Help with stairs or ramp for entrance;Assist for transportation   Can travel by private vehicle        Equipment Recommendations None  recommended by PT  Recommendations for Other Services       Functional Status Assessment Patient has had a recent decline in their functional status and/or demonstrates limited ability to make significant improvements in function in a reasonable and predictable amount of time     Precautions / Restrictions Precautions Precautions: Fall Recall of Precautions/Restrictions: Impaired Restrictions Weight Bearing Restrictions Per Provider Order: No      Mobility  Bed Mobility Overal bed mobility: Needs Assistance Bed Mobility: Supine to Sit, Sit to Supine     Supine to sit: Mod assist, Max assist Sit to supine: Mod assist, Max assist   General bed mobility comments: needed much assist to move trunk and LEs for upright posture and scooting EOB    Transfers                        Ambulation/Gait                  Stairs            Wheelchair Mobility     Tilt Bed    Modified Rankin (Stroke Patients Only)       Balance Overall balance assessment: Needs assistance Sitting-balance support: Feet supported, Bilateral upper extremity supported Sitting balance-Leahy Scale: Poor Sitting balance - Comments: poor/fair seated EOB       Standing balance comment: Unable to assess  Pertinent Vitals/Pain Pain Assessment Pain Assessment: Faces Faces Pain Scale: Hurts little more Pain Location: back Pain Descriptors / Indicators: Discomfort, Aching Pain Intervention(s): Monitored during session    Home Living Family/patient expects to be discharged to:: Assisted living                   Additional Comments: per chart, uses wheelchair at facility    Prior Function Prior Level of Function : Patient poor historian/Family not available;Needs assist       Physical Assist : Mobility (physical);ADLs (physical) Mobility (physical): Bed mobility;Transfers;Gait;Stairs ADLs (physical):  Grooming;Bathing;Dressing;Toileting;IADLs Mobility Comments: per chart, primarily wheelchair user ADLs Comments: Reports she receives assistance with most ADLs. Able to feed herself     Extremity/Trunk Assessment        Lower Extremity Assessment Lower Extremity Assessment: Generalized weakness    Cervical / Trunk Assessment Cervical / Trunk Assessment: Kyphotic  Communication   Communication Communication: No apparent difficulties    Cognition Arousal: Alert Behavior During Therapy: Agitated   PT - Cognitive impairments: No family/caregiver present to determine baseline                       PT - Cognition Comments: Patient able to answer some questions. Kept stating she has dementia Following commands: Intact       Cueing Cueing Techniques: Verbal cues, Tactile cues     General Comments      Exercises     Assessment/Plan    PT Assessment All further PT needs can be met in the next venue of care  PT Problem List Decreased strength;Decreased balance;Decreased mobility;Decreased activity tolerance       PT Treatment Interventions      PT Goals (Current goals can be found in the Care Plan section)  Acute Rehab PT Goals Patient Stated Goal: return home PT Goal Formulation: With patient Time For Goal Achievement: 06/14/24 Potential to Achieve Goals: Good    Frequency       Co-evaluation               AM-PAC PT 6 Clicks Mobility  Outcome Measure Help needed turning from your back to your side while in a flat bed without using bedrails?: A Lot Help needed moving from lying on your back to sitting on the side of a flat bed without using bedrails?: A Lot Help needed moving to and from a bed to a chair (including a wheelchair)?: A Lot Help needed standing up from a chair using your arms (e.g., wheelchair or bedside chair)?: A Lot Help needed to walk in hospital room?: A Lot Help needed climbing 3-5 steps with a railing? : Total 6 Click  Score: 11    End of Session   Activity Tolerance: Treatment limited secondary to agitation Patient left: in bed;with bed alarm set;with call bell/phone within reach Nurse Communication: Mobility status PT Visit Diagnosis: Unsteadiness on feet (R26.81);Other abnormalities of gait and mobility (R26.89);Muscle weakness (generalized) (M62.81);History of falling (Z91.81)    Time: 8967-8947 PT Time Calculation (min) (ACUTE ONLY): 20 min   Charges:   PT Evaluation $PT Eval Moderate Complexity: 1 Mod PT Treatments $Therapeutic Activity: 8-22 mins PT General Charges $$ ACUTE PT VISIT: 1 Visit        12:23 PM, 06/14/24,  Onnie Como, SPT     This licensed practitioner was present in the room guiding the student in service delivery.Therapy student was participating in the provision of services, and the practitioner was  not engaged in treating another patient or doing other tasks at the same time.    12:33 PM, 06/14/24 Lynwood Music, MPT Physical Therapist with [facility] 336 [phone] office [phone] mobile phone

## 2024-06-16 DIAGNOSIS — M6281 Muscle weakness (generalized): Secondary | ICD-10-CM | POA: Diagnosis not present

## 2024-06-16 DIAGNOSIS — R278 Other lack of coordination: Secondary | ICD-10-CM | POA: Diagnosis not present

## 2024-06-16 DIAGNOSIS — I499 Cardiac arrhythmia, unspecified: Secondary | ICD-10-CM | POA: Diagnosis not present

## 2024-06-16 DIAGNOSIS — I10 Essential (primary) hypertension: Secondary | ICD-10-CM | POA: Diagnosis not present

## 2024-06-18 DIAGNOSIS — I10 Essential (primary) hypertension: Secondary | ICD-10-CM | POA: Diagnosis not present

## 2024-06-18 DIAGNOSIS — R279 Unspecified lack of coordination: Secondary | ICD-10-CM | POA: Diagnosis not present

## 2024-06-18 DIAGNOSIS — Z9181 History of falling: Secondary | ICD-10-CM | POA: Diagnosis not present

## 2024-06-18 DIAGNOSIS — I4891 Unspecified atrial fibrillation: Secondary | ICD-10-CM | POA: Diagnosis not present

## 2024-06-18 DIAGNOSIS — M6281 Muscle weakness (generalized): Secondary | ICD-10-CM | POA: Diagnosis not present

## 2024-06-19 ENCOUNTER — Telehealth: Payer: Self-pay

## 2024-06-19 DIAGNOSIS — I2510 Atherosclerotic heart disease of native coronary artery without angina pectoris: Secondary | ICD-10-CM | POA: Diagnosis not present

## 2024-06-19 DIAGNOSIS — N179 Acute kidney failure, unspecified: Secondary | ICD-10-CM | POA: Diagnosis not present

## 2024-06-19 DIAGNOSIS — I499 Cardiac arrhythmia, unspecified: Secondary | ICD-10-CM | POA: Diagnosis not present

## 2024-06-19 DIAGNOSIS — E860 Dehydration: Secondary | ICD-10-CM | POA: Diagnosis not present

## 2024-06-19 DIAGNOSIS — G9341 Metabolic encephalopathy: Secondary | ICD-10-CM | POA: Diagnosis not present

## 2024-06-19 DIAGNOSIS — E871 Hypo-osmolality and hyponatremia: Secondary | ICD-10-CM | POA: Diagnosis not present

## 2024-06-19 DIAGNOSIS — R278 Other lack of coordination: Secondary | ICD-10-CM | POA: Diagnosis not present

## 2024-06-19 DIAGNOSIS — I110 Hypertensive heart disease with heart failure: Secondary | ICD-10-CM | POA: Diagnosis not present

## 2024-06-19 DIAGNOSIS — I50812 Chronic right heart failure: Secondary | ICD-10-CM | POA: Diagnosis not present

## 2024-06-19 DIAGNOSIS — I5032 Chronic diastolic (congestive) heart failure: Secondary | ICD-10-CM | POA: Diagnosis not present

## 2024-06-19 DIAGNOSIS — I10 Essential (primary) hypertension: Secondary | ICD-10-CM | POA: Diagnosis not present

## 2024-06-19 DIAGNOSIS — M6281 Muscle weakness (generalized): Secondary | ICD-10-CM | POA: Diagnosis not present

## 2024-06-19 DIAGNOSIS — I480 Paroxysmal atrial fibrillation: Secondary | ICD-10-CM | POA: Diagnosis not present

## 2024-06-19 NOTE — Telephone Encounter (Signed)
 Copied from CRM [id number]. Topic: Clinical - Medical Advice >> Jun 19, 2024  1:21 PM Harlene ORN wrote: Reason for CRM: Norma Barajas, the Physical Therapist with [facility] recently d/c from hospital last thursday. Not eating well at all; poor appetitie in the past two to four days. And has two drug interactions (level 1 interaction with cardizen with simvastatin ). (level 2 interaction potassium with medclizine) Is in high risk for re-hospitalization due to poor appetite and lack of hydration. If any questions, please call back at the number below. Phone: [phone]

## 2024-06-20 DIAGNOSIS — R279 Unspecified lack of coordination: Secondary | ICD-10-CM | POA: Diagnosis not present

## 2024-06-20 DIAGNOSIS — I4891 Unspecified atrial fibrillation: Secondary | ICD-10-CM | POA: Diagnosis not present

## 2024-06-20 DIAGNOSIS — M6281 Muscle weakness (generalized): Secondary | ICD-10-CM | POA: Diagnosis not present

## 2024-06-20 DIAGNOSIS — Z9181 History of falling: Secondary | ICD-10-CM | POA: Diagnosis not present

## 2024-06-20 DIAGNOSIS — I10 Essential (primary) hypertension: Secondary | ICD-10-CM | POA: Diagnosis not present

## 2024-06-21 ENCOUNTER — Ambulatory Visit (INDEPENDENT_AMBULATORY_CARE_PROVIDER_SITE_OTHER): Admitting: Family Medicine

## 2024-06-21 ENCOUNTER — Encounter: Payer: Self-pay | Admitting: Family Medicine

## 2024-06-21 VITALS — BP 126/81 | HR 74 | Temp 97.6°F | Ht <= 58 in | Wt 103.0 lb

## 2024-06-21 DIAGNOSIS — M545 Low back pain, unspecified: Secondary | ICD-10-CM

## 2024-06-21 DIAGNOSIS — F339 Major depressive disorder, recurrent, unspecified: Secondary | ICD-10-CM

## 2024-06-21 DIAGNOSIS — E440 Moderate protein-calorie malnutrition: Secondary | ICD-10-CM

## 2024-06-21 DIAGNOSIS — I5032 Chronic diastolic (congestive) heart failure: Secondary | ICD-10-CM | POA: Diagnosis not present

## 2024-06-21 DIAGNOSIS — R531 Weakness: Secondary | ICD-10-CM

## 2024-06-21 DIAGNOSIS — I10 Essential (primary) hypertension: Secondary | ICD-10-CM | POA: Diagnosis not present

## 2024-06-21 DIAGNOSIS — E871 Hypo-osmolality and hyponatremia: Secondary | ICD-10-CM | POA: Diagnosis not present

## 2024-06-21 DIAGNOSIS — R6889 Other general symptoms and signs: Secondary | ICD-10-CM | POA: Diagnosis not present

## 2024-06-21 DIAGNOSIS — N179 Acute kidney failure, unspecified: Secondary | ICD-10-CM | POA: Diagnosis not present

## 2024-06-21 DIAGNOSIS — M6281 Muscle weakness (generalized): Secondary | ICD-10-CM | POA: Diagnosis not present

## 2024-06-21 DIAGNOSIS — R627 Adult failure to thrive: Secondary | ICD-10-CM | POA: Diagnosis not present

## 2024-06-21 DIAGNOSIS — I499 Cardiac arrhythmia, unspecified: Secondary | ICD-10-CM | POA: Diagnosis not present

## 2024-06-21 DIAGNOSIS — I480 Paroxysmal atrial fibrillation: Secondary | ICD-10-CM | POA: Diagnosis not present

## 2024-06-21 DIAGNOSIS — I4891 Unspecified atrial fibrillation: Secondary | ICD-10-CM | POA: Diagnosis not present

## 2024-06-21 DIAGNOSIS — R278 Other lack of coordination: Secondary | ICD-10-CM | POA: Diagnosis not present

## 2024-06-21 DIAGNOSIS — I110 Hypertensive heart disease with heart failure: Secondary | ICD-10-CM | POA: Diagnosis not present

## 2024-06-21 DIAGNOSIS — I509 Heart failure, unspecified: Secondary | ICD-10-CM | POA: Diagnosis not present

## 2024-06-21 DIAGNOSIS — I2510 Atherosclerotic heart disease of native coronary artery without angina pectoris: Secondary | ICD-10-CM | POA: Diagnosis not present

## 2024-06-21 DIAGNOSIS — E860 Dehydration: Secondary | ICD-10-CM | POA: Diagnosis not present

## 2024-06-21 DIAGNOSIS — G8929 Other chronic pain: Secondary | ICD-10-CM

## 2024-06-21 DIAGNOSIS — G9341 Metabolic encephalopathy: Secondary | ICD-10-CM | POA: Diagnosis not present

## 2024-06-21 DIAGNOSIS — I50812 Chronic right heart failure: Secondary | ICD-10-CM | POA: Diagnosis not present

## 2024-06-21 MED ORDER — SERTRALINE HCL 25 MG PO TABS: 25.0000 mg | ORAL_TABLET | Freq: Every day | ORAL | 3 refills | Status: DC

## 2024-06-21 NOTE — Progress Notes (Signed)
 Subjective:  Patient ID: Norma Barajas, female    DOB: 19-Sep-1941, 83 y.o.   MRN: [id number]  Patient Care Team: Severa Rock HERO, FNP as PCP - General (Family Medicine) Lavona Agent, MD as PCP - Cardiology (Cardiology) Harvey Margo CROME, MD (Inactive) as Attending Physician (Gastroenterology) Nivia Sally Alice, OD as Consulting Physician (Optometry) Dow Arland [name], NP (Inactive) as Consulting Physician (Cardiology)   Chief Complaint:  Hospitalization Follow-up (06/12/2024 - 06/14/2024 (2 days)/[facility]- AKI. C/O weakness and loss off appetite ) and Edema (Bilateral ankle swelling )   HPI: Norma Barajas is a 83 y.o. female presenting on 06/21/2024 for Hospitalization Follow-up (06/12/2024 - 06/14/2024 (2 days)/[facility]- AKI. C/O weakness and loss off appetite ) and Edema (Bilateral ankle swelling )   Norma Barajas is an 83 year old female with heart failure and atrial fibrillation who presents with poor oral intake and weakness.  She was recently hospitalized for acute kidney injury, dehydration, and metabolic encephalopathy, returning to her facility on August 7th. Since her return, she has had poor oral intake, consuming Ensure or Boost two to three times daily, with low fluid intake and infrequent urination.  She notes swelling in her ankles and feet. No worsening shortness of breath is reported, but she experiences significant weakness and fatigue, feeling 'awful' and unable to perform activities.  She experiences poor sleep, taking a sleeping pill at night but is unsure of when she falls asleep. She has back pain, particularly on the left side, which started during her hospital stay. She takes Tylenol  650 mg for pain, having previously used hydrocodone  in the hospital.  Her caregiver notes she is refusing to eat, often only taking small bites of food and rarely drinking a full bottle of water . The caregiver also mentions she has been depressed since her husband's passing and  the sale of her house, which may be contributing to her poor appetite and overall condition.  She reports her legs hurt from a fall. She has been in a wheelchair for some time and is not engaging in much physical activity. Her caregiver assists her four days a week, but she is not motivated to eat or move.          Relevant past medical, surgical, family, and social history reviewed and updated as indicated.  Allergies and medications reviewed and updated. Data reviewed: Chart in Epic.   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 Nonobstructive minimal CAD 10/2005.   Depression    Diastolic dysfunction    Grade 1. Ejection fraction 60-65%.   Dysrhythmia    a fib   Erosive esophagitis    Essential hypertension    Fracture of ramus of right pubis with routine healing 12/09/2020   GERD (gastroesophageal reflux disease)    Grade III hemorrhoids    History of hiatal hernia    History of prediabetes    Hypercholesterolemia    Internal hemorrhoids with complication 07/29/2008   OCT 2015 FLEX SIG/IH BANDING     Intertrochanteric fracture of femur (HCC)    5/16 - 03/26/2021 fracture sustained in a mechanical fall.  IM nailing performed 5/17 by Dr Oneil Horde Warfarin held because of posttraumatic  pelvic hematoma.  Postop DVT with 81 mg aspirin  twice daily x28 days.   Major depressive disorder, recurrent, moderate (HCC) 12/09/2020   Migraine    used to have them right bad; I don't now (04/11/2015)   MITRAL REGURGITATION 04/27/2010   Qualifier: Diagnosis of  By: Delford, MD, CODY Maude Dunnings    Osteoporosis    Paroxysmal atrial fibrillation [facility])    Pelvic hematoma, female 03/27/2021   Sustained in mechanical fall.  Warfarin held.  IM nailing performed 5/17 with  postop DVT prophylaxis 81 mg twice daily x28 days.   PONV (postoperative nausea and vomiting)    Vertigo     Past Surgical History:  Procedure Laterality Date   BRAVO [facility] STUDY  11/15/2012   Procedure: BRAVO PH STUDY;  Surgeon: Margo LITTIE Haddock, MD;  Location: [facility];  Service: Endoscopy;;   CARDIOVERSION N/A 05/16/2020   Procedure: CARDIOVERSION;  Surgeon: Kate Lonni LITTIE, MD;  Location: [facility] ENDOSCOPY;  Service: Cardiovascular;  Laterality: N/A;   CATARACT EXTRACTION W/ INTRAOCULAR LENS  IMPLANT, BILATERAL Bilateral    COLONOSCOPY  2008   Dr. Haddock: internal hemorrhoids    DILATION AND CURETTAGE OF UTERUS     ESOPHAGEAL DILATION N/A 06/16/2021   Procedure: ESOPHAGEAL DILATION;  Surgeon: Eartha Angelia Sieving, MD;  Location: [facility] END[facility];  Service: Gastroenterology;  Laterality: N/A;   ESOPHAGOGASTRODUODENOSCOPY (EGD) WITH ESOPHAGEAL DILATION  2001   Dr. Debrah: erosive esophagitis, esophageal stricture, duodenitis, s/p Savary dilation   ESOPHAGOGASTRODUODENOSCOPY (EGD) WITH ESOPHAGEAL DILATION  11/15/2012   DOQ:Zdneyjhzjo web was found & MOST LIKELY CAUSE FOR DYAPHAGIA/Polyp was found in the gastric body and gastric fundus/ gastritis on bx   ESOPHAGOGASTRODUODENOSCOPY (EGD) WITH PROPOFOL  N/A 06/16/2021   Procedure: ESOPHAGOGASTRODUODENOSCOPY (EGD) WITH PROPOFOL ;  Surgeon: Eartha Angelia Sieving, MD;  Location: [facility];  Service: Gastroenterology;  Laterality: N/A;  8:15   EYE SURGERY Bilateral    laser OR after cataract OR; cause I couldn't see   FLEXIBLE SIGMOIDOSCOPY N/A 08/22/2014   mild diverticulosis in sigmoid, moderate sized Grade 3 hemorrhoids s/p banding X 3.    FRACTURE SURGERY Right    below the knee - 2 bones broke has plates   HEMORRHOID BANDING N/A 08/22/2014   Procedure: HEMORRHOID BANDING;  Surgeon: Margo LITTIE Haddock, MD;  Location: [facility];  Service: Endoscopy;  Laterality: N/A;   HEMORRHOID SURGERY N/A 03/30/2018   Procedure: EXTENSIVE  HEMORRHOIDECTOMY;  Surgeon: Kallie Manuelita [name], MD;  Location: [facility] ORS;  Service: General;  Laterality: N/A;   INTRAMEDULLARY (IM) NAIL INTERTROCHANTERIC Right 03/24/2021   Procedure: INTRAMEDULLARY (IM) NAIL INTERTROCHANTRIC;  Surgeon: Onesimo Oneil LABOR, MD;  Location: [facility] ORS;  Service: Orthopedics;  Laterality: Right;   OPEN REDUCTION INTERNAL FIXATION (ORIF) TIBIA/FIBULA FRACTURE Right 2013   broke tibia and fibula after falling down stairs   PROLAPSED UTERINE FIBROID LIGATION  2015   TOTAL ABDOMINAL HYSTERECTOMY      Social History   Socioeconomic History   Marital status: Widowed    Spouse name: george    Number of children: 1   Years of education: Not on file   Highest education level: Not on file  Occupational History   Occupation: Retired    Comment: Textile  Tobacco Use   Smoking status: Never   Smokeless tobacco: Never  Vaping Use   Vaping status: Never Used  Substance and Sexual Activity   Alcohol use: No    Alcohol/week: 0.0 standard drinks of alcohol   Drug use:  No   Sexual activity: Not Currently    Birth control/protection: Surgical, Post-menopausal    Comment: hyst  Other Topics Concern   Not on file  Social History Narrative   Married   No regular exercise   Social Drivers of Health   Financial Resource Strain: Low Risk  (07/04/2023)   Overall Financial Resource Strain (CARDIA)    Difficulty of Paying Living Expenses: Not hard at all  Food Insecurity: No Food Insecurity (06/12/2024)   Hunger Vital Sign    Worried About Running Out of Food in the Last Year: Never true    Ran Out of Food in the Last Year: Never true  Transportation Needs: No Transportation Needs (06/12/2024)   PRAPARE - Administrator, Civil Service (Medical): No    Lack of Transportation (Non-Medical): No  Physical Activity: Unknown (07/04/2023)   Exercise Vital Sign    Days of Exercise per Week: 0 days    Minutes of Exercise per Session: Not on file  Stress: Stress Concern  Present (07/04/2023)   [employer] of Occupational Health - Occupational Stress Questionnaire    Feeling of Stress : To some extent  Social Connections: Socially Isolated (06/12/2024)   Social Connection and Isolation Panel    Frequency of Communication with Friends and Family: More than three times a week    Frequency of Social Gatherings with Friends and Family: More than three times a week    Attends Religious Services: Never    Database administrator or Organizations: No    Attends Banker Meetings: Never    Marital Status: Widowed  Intimate Partner Violence: Not At Risk (06/12/2024)   Humiliation, Afraid, Rape, and Kick questionnaire    Fear of Current or Ex-Partner: No    Emotionally Abused: No    Physically Abused: No    Sexually Abused: No    Outpatient Encounter Medications as of 06/21/2024  Medication Sig   acetaminophen  (TYLENOL ) 325 MG tablet Take 2 tablets (650 mg total) by mouth every 6 (six) hours as needed for mild pain (pain score 1-3) or fever (or Fever >/= 101).   aluminum-magnesium  hydroxide 200-200 MG/5ML suspension Take 30 mLs by mouth as needed for indigestion (heartburn). Up to 4 times a day in 24 hours.   aspirin  EC (ASPIRIN  LOW DOSE) 81 MG tablet Take 1 tablet (81 mg total) by mouth daily with breakfast. Swallow whole.   cholecalciferol  (VITAMIN D3) 25 MCG (1000 UNIT) tablet Take 1,000 Units by mouth in the morning.   diltiazem  (CARDIZEM  CD) 240 MG 24 hr capsule Take 1 capsule (240 mg total) by mouth daily.   feeding supplement (ENSURE PLUS HIGH PROTEIN) LIQD Take 237 mLs by mouth 2 (two) times daily between meals.   meclizine  (ANTIVERT ) 25 MG tablet Take 1 tablet (25 mg total) by mouth 3 (three) times daily as needed for dizziness.   metoprolol  succinate (TOPROL -XL) 100 MG 24 hr tablet Take 1 tablet (100 mg total) by mouth 2 (two) times daily. Take with or immediately following a meal.   midodrine  (PROAMATINE ) 2.5 MG tablet Take 1 tablet (2.5 mg  total) by mouth 2 (two) times daily with a meal.   omeprazole  (PRILOSEC) 20 MG capsule Take 1 capsule (20 mg total) by mouth daily.   ondansetron  (ZOFRAN ) 4 MG tablet Take 1 tablet (4 mg total) by mouth every 8 (eight) hours as needed for nausea or vomiting.   polyethylene glycol (MIRALAX  / GLYCOLAX ) 17 g packet Take  17 g by mouth 2 (two) times daily as needed for mild constipation.   potassium chloride  (KLOR-CON  M) 10 MEQ tablet TAKE 1 TABLET EVERY DAY (TAKE WHILE TAKING DEMADEX /TORSEMIDE  AS DIRECTED)   sertraline  (ZOLOFT ) 25 MG tablet Take 1 tablet (25 mg total) by mouth daily.   simvastatin  (ZOCOR ) 20 MG tablet Take 1 tablet (20 mg total) by mouth daily. (Patient taking differently: Take 20 mg by mouth at bedtime.)   torsemide  (DEMADEX ) 20 MG tablet Take 1 tablet (20 mg total) by mouth every Monday, Wednesday, and Friday.   traZODone  (DESYREL ) 50 MG tablet Take 1 tablet (50 mg total) by mouth at bedtime as needed for sleep.   No facility-administered encounter medications on file as of 06/21/2024.    Allergies  Allergen Reactions   Codeine Nausea And Vomiting   Triamterene-Hctz Other (See Comments)    Weakness    Atorvastatin Other (See Comments)    Myalgias    Crestor [Rosuvastatin Calcium ] Other (See Comments)    Weakness    Doxycycline  Other (See Comments)    Chest congestion   Morphine  Nausea Only   Risedronate Sodium Other (See Comments)    ACTONEL - reflux    Pertinent ROS per HPI, otherwise unremarkable      Objective:  BP 126/81   Pulse 74   Temp 97.6 F (36.4 C)   Ht 4' 8 (1.422 m)   Wt 103 lb (46.7 kg)   SpO2 93%   BMI 23.09 kg/m    Wt Readings from Last 3 Encounters:  06/21/24 103 lb (46.7 kg)  06/12/24 101 lb (45.8 kg)  06/08/24 101 lb (45.8 kg)    Physical Exam Vitals and nursing note reviewed.  Constitutional:      Appearance: She is ill-appearing.     Comments: Frail elderly  HENT:     Head: Normocephalic and atraumatic.     Mouth/Throat:      Mouth: Mucous membranes are moist.  Eyes:     Pupils: Pupils are equal, round, and reactive to light.  Cardiovascular:     Rate and Rhythm: Normal rate. Rhythm irregularly irregular.     Heart sounds: Murmur heard.  Musculoskeletal:     Cervical back: Neck supple. No tenderness.     Thoracic back: No tenderness.     Lumbar back: No tenderness.     Right lower leg: 1+ Edema present.     Left lower leg: 1+ Edema present.     Comments: Kyphosis  Skin:    General: Skin is warm and dry.     Capillary Refill: Capillary refill takes less than 2 seconds.  Neurological:     General: No focal deficit present.     Mental Status: She is alert and oriented to person, place, and time.     Motor: Weakness (generalized) present.     Gait: Gait abnormal (in wheelchair).  Psychiatric:        Mood and Affect: Mood is depressed. Affect is flat.        Speech: Speech normal.        Behavior: Behavior is withdrawn.        Thought Content: Thought content normal.     Results for orders placed or performed during the hospital encounter of 06/12/24  Urinalysis, Routine w reflex microscopic -Urine, Catheterized   Collection Time: 06/12/24  1:22 PM  Result Value Ref Range   Color, Urine YELLOW YELLOW   APPearance CLEAR CLEAR   Specific Gravity, Urine 1.017  1.005 - 1.030   pH 5.0 5.0 - 8.0   Glucose, UA NEGATIVE NEGATIVE mg/dL   Hgb urine dipstick NEGATIVE NEGATIVE   Bilirubin Urine NEGATIVE NEGATIVE   Ketones, ur NEGATIVE NEGATIVE mg/dL   Protein, ur NEGATIVE NEGATIVE mg/dL   Nitrite NEGATIVE NEGATIVE   Leukocytes,Ua NEGATIVE NEGATIVE  CBC with Differential   Collection Time: 06/12/24  2:10 PM  Result Value Ref Range   WBC 8.8 4.0 - 10.5 K/uL   RBC 4.18 3.87 - 5.11 MIL/uL   Hemoglobin 14.1 12.0 - 15.0 g/dL   HCT 59.7 63.9 - 53.9 %   MCV 96.2 80.0 - 100.0 fL   MCH 33.7 26.0 - 34.0 pg   MCHC 35.1 30.0 - 36.0 g/dL   RDW 84.7 88.4 - 84.4 %   Platelets 112 (L) 150 - 400 K/uL   nRBC 0.9 (H)  0.0 - 0.2 %   Neutrophils Relative % 79 %   Neutro Abs 7.0 1.7 - 7.7 K/uL   Lymphocytes Relative 8 %   Lymphs Abs 0.7 0.7 - 4.0 K/uL   Monocytes Relative 12 %   Monocytes Absolute 1.0 0.1 - 1.0 K/uL   Eosinophils Relative 0 %   Eosinophils Absolute 0.0 0.0 - 0.5 K/uL   Basophils Relative 0 %   Basophils Absolute 0.0 0.0 - 0.1 K/uL   Immature Granulocytes 1 %   Abs Immature Granulocytes 0.10 (H) 0.00 - 0.07 K/uL   Abs Granulocyte 7.0 (H) 1.5 - 6.5 K/uL  Comprehensive metabolic panel   Collection Time: 06/12/24  2:10 PM  Result Value Ref Range   Sodium 128 (L) 135 - 145 mmol/L   Potassium 5.7 (H) 3.5 - 5.1 mmol/L   Chloride 93 (L) 98 - 111 mmol/L   CO2 18 (L) 22 - 32 mmol/L   Glucose, Bld 106 (H) 70 - 99 mg/dL   BUN 76 (H) 8 - 23 mg/dL   Creatinine, Ser 7.47 (H) 0.44 - 1.00 mg/dL   Calcium  7.9 (L) 8.9 - 10.3 mg/dL   Total Protein 6.3 (L) 6.5 - 8.1 g/dL   Albumin 3.2 (L) 3.5 - 5.0 g/dL   AST 65 (H) 15 - 41 U/L   ALT 64 (H) 0 - 44 U/L   Alkaline Phosphatase 77 38 - 126 U/L   Total Bilirubin 1.8 (H) 0.0 - 1.2 mg/dL   GFR, Estimated 18 (L) >60 mL/min   Anion gap 17 (H) 5 - 15  Basic metabolic panel   Collection Time: 06/13/24  5:42 AM  Result Value Ref Range   Sodium 133 (L) 135 - 145 mmol/L   Potassium 4.4 3.5 - 5.1 mmol/L   Chloride 103 98 - 111 mmol/L   CO2 18 (L) 22 - 32 mmol/L   Glucose, Bld 105 (H) 70 - 99 mg/dL   BUN 54 (H) 8 - 23 mg/dL   Creatinine, Ser 8.49 (H) 0.44 - 1.00 mg/dL   Calcium  7.9 (L) 8.9 - 10.3 mg/dL   GFR, Estimated 34 (L) >60 mL/min   Anion gap 12 5 - 15  Comprehensive metabolic panel   Collection Time: 06/14/24  9:51 AM  Result Value Ref Range   Sodium 131 (L) 135 - 145 mmol/L   Potassium 4.3 3.5 - 5.1 mmol/L   Chloride 104 98 - 111 mmol/L   CO2 17 (L) 22 - 32 mmol/L   Glucose, Bld 124 (H) 70 - 99 mg/dL   BUN 28 (H) 8 - 23 mg/dL   Creatinine, Ser  0.76 0.44 - 1.00 mg/dL   Calcium  8.0 (L) 8.9 - 10.3 mg/dL   Total Protein 5.6 (L) 6.5 - 8.1 g/dL    Albumin 2.8 (L) 3.5 - 5.0 g/dL   AST 31 15 - 41 U/L   ALT 46 (H) 0 - 44 U/L   Alkaline Phosphatase 71 38 - 126 U/L   Total Bilirubin 1.1 0.0 - 1.2 mg/dL   GFR, Estimated >39 >39 mL/min   Anion gap 10 5 - 15   *Note: Due to a large number of results and/or encounters for the requested time period, some results have not been displayed. A complete set of results can be found in Results Review.       Pertinent labs & imaging results that were available during my care of the patient were reviewed by me and considered in my medical decision making.  Assessment & Plan:  Norma Barajas was seen today for hospitalization follow-up and edema.  Diagnoses and all orders for this visit:  AKI (acute kidney injury) (HCC) -     CMP14+EGFR -     Anemia Profile B -     Thyroid  Panel With TSH  Moderate protein-calorie malnutrition (HCC) -     CMP14+EGFR -     For home use only DME Other see comment -     Anemia Profile B -     Thyroid  Panel With TSH  Generalized weakness -     CMP14+EGFR -     For home use only DME Other see comment -     Anemia Profile B -     Thyroid  Panel With TSH  Chronic bilateral low back pain without sciatica -     Anemia Profile B  Hyponatremia -     CMP14+EGFR -     Anemia Profile B -     Thyroid  Panel With TSH  Failure to thrive in adult -     CMP14+EGFR -     sertraline  (ZOLOFT ) 25 MG tablet; Take 1 tablet (25 mg total) by mouth daily. -     Anemia Profile B -     Thyroid  Panel With TSH  Depression, recurrent (HCC) -     CMP14+EGFR -     sertraline  (ZOLOFT ) 25 MG tablet; Take 1 tablet (25 mg total) by mouth daily. -     Anemia Profile B -     Thyroid  Panel With TSH       Protein-calorie malnutrition and adult failure to thrive Norma Barajas is experiencing protein-calorie malnutrition and adult failure to thrive, evidenced by low albumin levels and edema. She is not eating adequately, consuming only small amounts of food and water , and relies on Ensure or Boost  twice daily. Her protein intake is insufficient, contributing to her condition. Edema is primarily due to protein-calorie malnutrition and low albumin levels, causing fluid shifts. It is not primarily related to heart failure. - Encourage increased intake of high-protein foods and supplements like Ensure Plus high protein. - Order Magic Cups if available through DME or consider ordering from third-party sources. - Recheck kidney function and albumin levels today. - Focus on improving nutritional status to address edema.  Major depressive disorder, single episode Norma Barajas is experiencing a single episode of major depressive disorder, likely exacerbated by the recent loss of her husband and changes in her living situation. She reports feeling weak, tired, and has a lack of appetite, which may be related to her depression. She agrees to start medication for depression. -  Start low-dose sertraline  once daily for depression. - Monitor for improvement in mood and appetite. - Consider adding an appetite stimulant if no improvement is seen with sertraline .  Heart failure Norma Barajas has heart failure, but current symptoms such as edema are attributed more to protein deficiency rather than heart failure exacerbation. No worsening of shortness of breath reported.  Atrial fibrillation Norma Barajas has atrial fibrillation. No specific discussion of current symptoms or management changes during this visit.  Chronic low back pain Norma Barajas reports chronic low back pain, which is not exacerbated by palpation. She is currently taking Tylenol  for pain management. - Continue Tylenol  for pain management.  Insomnia Norma Barajas experiences insomnia, taking a sleeping pill at night. She reports difficulty sleeping despite medication. - Continue current sleeping pill regimen.          Continue all other maintenance medications.  Follow up plan: Return in about 4 weeks (around 07/19/2024), or if symptoms worsen or fail to improve, for  Depression.   Continue healthy lifestyle choices, including diet (rich in fruits, vegetables, and lean proteins, and low in salt and simple carbohydrates) and exercise (at least 30 minutes of moderate physical activity daily).   The above assessment and management plan was discussed with the patient. The patient verbalized understanding of and has agreed to the management plan. Patient is aware to call the clinic if they develop any new symptoms or if symptoms persist or worsen. Patient is aware when to return to the clinic for a follow-up visit. Patient educated on when it is appropriate to go to the emergency department.   Norma Bruns, FNP-C [facility] [phone]

## 2024-06-22 ENCOUNTER — Inpatient Hospital Stay (HOSPITAL_COMMUNITY)
Admission: EM | Admit: 2024-06-22 | Discharge: 2024-06-25 | DRG: 640 | Disposition: A | Discharge status: Home Health | Attending: Family Medicine | Admitting: Family Medicine

## 2024-06-22 ENCOUNTER — Ambulatory Visit: Payer: Self-pay | Admitting: Family Medicine

## 2024-06-22 ENCOUNTER — Emergency Department (HOSPITAL_COMMUNITY)

## 2024-06-22 ENCOUNTER — Other Ambulatory Visit: Payer: Self-pay

## 2024-06-22 DIAGNOSIS — I2729 Other secondary pulmonary hypertension: Secondary | ICD-10-CM | POA: Diagnosis present

## 2024-06-22 DIAGNOSIS — G8929 Other chronic pain: Secondary | ICD-10-CM | POA: Diagnosis present

## 2024-06-22 DIAGNOSIS — K219 Gastro-esophageal reflux disease without esophagitis: Secondary | ICD-10-CM | POA: Diagnosis present

## 2024-06-22 DIAGNOSIS — I5033 Acute on chronic diastolic (congestive) heart failure: Secondary | ICD-10-CM | POA: Diagnosis present

## 2024-06-22 DIAGNOSIS — Z8049 Family history of malignant neoplasm of other genital organs: Secondary | ICD-10-CM

## 2024-06-22 DIAGNOSIS — I480 Paroxysmal atrial fibrillation: Secondary | ICD-10-CM | POA: Diagnosis present

## 2024-06-22 DIAGNOSIS — I5032 Chronic diastolic (congestive) heart failure: Secondary | ICD-10-CM

## 2024-06-22 DIAGNOSIS — I700 Atherosclerosis of aorta: Secondary | ICD-10-CM

## 2024-06-22 DIAGNOSIS — E7800 Pure hypercholesterolemia, unspecified: Secondary | ICD-10-CM | POA: Diagnosis present

## 2024-06-22 DIAGNOSIS — Z800 Family history of malignant neoplasm of digestive organs: Secondary | ICD-10-CM

## 2024-06-22 DIAGNOSIS — I4891 Unspecified atrial fibrillation: Secondary | ICD-10-CM | POA: Diagnosis not present

## 2024-06-22 DIAGNOSIS — R509 Fever, unspecified: Secondary | ICD-10-CM | POA: Diagnosis not present

## 2024-06-22 DIAGNOSIS — Z823 Family history of stroke: Secondary | ICD-10-CM | POA: Diagnosis not present

## 2024-06-22 DIAGNOSIS — R2681 Unsteadiness on feet: Secondary | ICD-10-CM | POA: Diagnosis present

## 2024-06-22 DIAGNOSIS — Z79899 Other long term (current) drug therapy: Secondary | ICD-10-CM

## 2024-06-22 DIAGNOSIS — M6281 Muscle weakness (generalized): Secondary | ICD-10-CM | POA: Diagnosis not present

## 2024-06-22 DIAGNOSIS — E8770 Fluid overload, unspecified: Principal | ICD-10-CM

## 2024-06-22 DIAGNOSIS — Z8261 Family history of arthritis: Secondary | ICD-10-CM

## 2024-06-22 DIAGNOSIS — Z7982 Long term (current) use of aspirin: Secondary | ICD-10-CM

## 2024-06-22 DIAGNOSIS — R0989 Other specified symptoms and signs involving the circulatory and respiratory systems: Secondary | ICD-10-CM | POA: Diagnosis not present

## 2024-06-22 DIAGNOSIS — I2510 Atherosclerotic heart disease of native coronary artery without angina pectoris: Secondary | ICD-10-CM | POA: Diagnosis present

## 2024-06-22 DIAGNOSIS — Z833 Family history of diabetes mellitus: Secondary | ICD-10-CM

## 2024-06-22 DIAGNOSIS — E871 Hypo-osmolality and hyponatremia: Secondary | ICD-10-CM | POA: Diagnosis present

## 2024-06-22 DIAGNOSIS — E876 Hypokalemia: Secondary | ICD-10-CM | POA: Diagnosis present

## 2024-06-22 DIAGNOSIS — Z8249 Family history of ischemic heart disease and other diseases of the circulatory system: Secondary | ICD-10-CM | POA: Diagnosis not present

## 2024-06-22 DIAGNOSIS — I10 Essential (primary) hypertension: Secondary | ICD-10-CM | POA: Diagnosis not present

## 2024-06-22 DIAGNOSIS — Z8262 Family history of osteoporosis: Secondary | ICD-10-CM

## 2024-06-22 DIAGNOSIS — Z881 Allergy status to other antibiotic agents status: Secondary | ICD-10-CM

## 2024-06-22 DIAGNOSIS — R296 Repeated falls: Secondary | ICD-10-CM | POA: Diagnosis present

## 2024-06-22 DIAGNOSIS — I2781 Cor pulmonale (chronic): Secondary | ICD-10-CM | POA: Diagnosis present

## 2024-06-22 DIAGNOSIS — Z66 Do not resuscitate: Secondary | ICD-10-CM | POA: Diagnosis present

## 2024-06-22 DIAGNOSIS — Z83438 Family history of other disorder of lipoprotein metabolism and other lipidemia: Secondary | ICD-10-CM

## 2024-06-22 DIAGNOSIS — M810 Age-related osteoporosis without current pathological fracture: Secondary | ICD-10-CM | POA: Diagnosis present

## 2024-06-22 DIAGNOSIS — Z888 Allergy status to other drugs, medicaments and biological substances status: Secondary | ICD-10-CM | POA: Diagnosis not present

## 2024-06-22 DIAGNOSIS — I5082 Biventricular heart failure: Secondary | ICD-10-CM | POA: Diagnosis present

## 2024-06-22 DIAGNOSIS — R918 Other nonspecific abnormal finding of lung field: Secondary | ICD-10-CM | POA: Diagnosis not present

## 2024-06-22 DIAGNOSIS — I509 Heart failure, unspecified: Secondary | ICD-10-CM

## 2024-06-22 DIAGNOSIS — I517 Cardiomegaly: Secondary | ICD-10-CM | POA: Diagnosis not present

## 2024-06-22 DIAGNOSIS — Z885 Allergy status to narcotic agent status: Secondary | ICD-10-CM | POA: Diagnosis not present

## 2024-06-22 DIAGNOSIS — E782 Mixed hyperlipidemia: Secondary | ICD-10-CM

## 2024-06-22 DIAGNOSIS — Z9181 History of falling: Secondary | ICD-10-CM | POA: Diagnosis not present

## 2024-06-22 DIAGNOSIS — Z7401 Bed confinement status: Secondary | ICD-10-CM | POA: Diagnosis not present

## 2024-06-22 DIAGNOSIS — I482 Chronic atrial fibrillation, unspecified: Secondary | ICD-10-CM

## 2024-06-22 DIAGNOSIS — R279 Unspecified lack of coordination: Secondary | ICD-10-CM | POA: Diagnosis not present

## 2024-06-22 DIAGNOSIS — R531 Weakness: Secondary | ICD-10-CM | POA: Diagnosis present

## 2024-06-22 DIAGNOSIS — I110 Hypertensive heart disease with heart failure: Secondary | ICD-10-CM | POA: Diagnosis present

## 2024-06-22 DIAGNOSIS — J811 Chronic pulmonary edema: Secondary | ICD-10-CM | POA: Diagnosis not present

## 2024-06-22 LAB — ANEMIA PROFILE B
Basophils Absolute: 0 x10E3/uL (ref 0.0–0.2)
Basos: 0 %
EOS (ABSOLUTE): 0.1 x10E3/uL (ref 0.0–0.4)
Eos: 1 %
Ferritin: 369 ng/mL — ABNORMAL HIGH (ref 15–150)
Folate: >20 ng/mL (ref >3.0)
Hematocrit: 41.2 % (ref 34.0–46.6)
Hemoglobin: 13.5 g/dL (ref 11.1–15.9)
Immature Grans (Abs): 0.1 x10E3/uL (ref 0.0–0.1)
Immature Granulocytes: 1 %
Iron Saturation: 27 % (ref 15–55)
Iron: 87 ug/dL (ref 27–139)
Lymphocytes Absolute: 0.8 x10E3/uL (ref 0.7–3.1)
Lymphs: 8 %
MCH: 33.9 pg — ABNORMAL HIGH (ref 26.6–33.0)
MCHC: 32.8 g/dL (ref 31.5–35.7)
MCV: 104 fL — ABNORMAL HIGH (ref 79–97)
Monocytes Absolute: 1.1 x10E3/uL — ABNORMAL HIGH (ref 0.1–0.9)
Monocytes: 12 %
Neutrophils Absolute: 7.6 x10E3/uL — ABNORMAL HIGH (ref 1.4–7.0)
Neutrophils: 78 %
Platelets: 194 x10E3/uL (ref 150–450)
RBC: 3.98 x10E6/uL (ref 3.77–5.28)
RDW: 16 % — ABNORMAL HIGH (ref 11.7–15.4)
Retic Ct Pct: 7.3 % — ABNORMAL HIGH (ref 0.6–2.6)
Total Iron Binding Capacity: 325 ug/dL (ref 250–450)
UIBC: 238 ug/dL (ref 118–369)
Vitamin B-12: 721 pg/mL (ref 232–1245)
WBC: 9.8 x10E3/uL (ref 3.4–10.8)

## 2024-06-22 LAB — COMPREHENSIVE METABOLIC PANEL WITH GFR
ALT: 20 U/L (ref 0–44)
AST: 20 U/L (ref 15–41)
Albumin: 3.3 g/dL — ABNORMAL LOW (ref 3.5–5.0)
Alkaline Phosphatase: 146 U/L — ABNORMAL HIGH (ref 38–126)
Anion gap: 12 (ref 5–15)
BUN: 22 mg/dL (ref 8–23)
CO2: 23 mmol/L (ref 22–32)
Calcium: 8.2 mg/dL — ABNORMAL LOW (ref 8.9–10.3)
Chloride: 92 mmol/L — ABNORMAL LOW (ref 98–111)
Creatinine, Ser: 0.84 mg/dL (ref 0.44–1.00)
GFR, Estimated: >60 mL/min (ref >60)
Glucose, Bld: 123 mg/dL — ABNORMAL HIGH (ref 70–99)
Potassium: 4.5 mmol/L (ref 3.5–5.1)
Sodium: 127 mmol/L — ABNORMAL LOW (ref 135–145)
Total Bilirubin: 1.3 mg/dL — ABNORMAL HIGH (ref 0.0–1.2)
Total Protein: 6.1 g/dL — ABNORMAL LOW (ref 6.5–8.1)

## 2024-06-22 LAB — CMP14+EGFR
ALT: 22 IU/L (ref 0–32)
AST: 22 IU/L (ref 0–40)
Albumin: 3.8 g/dL (ref 3.7–4.7)
Alkaline Phosphatase: 164 IU/L — ABNORMAL HIGH (ref 44–121)
BUN/Creatinine Ratio: 24 (ref 12–28)
BUN: 18 mg/dL (ref 8–27)
Bilirubin Total: 0.8 mg/dL (ref 0.0–1.2)
CO2: 19 mmol/L — ABNORMAL LOW (ref 20–29)
Calcium: 8.7 mg/dL (ref 8.7–10.3)
Chloride: 90 mmol/L — ABNORMAL LOW (ref 96–106)
Creatinine, Ser: 0.74 mg/dL (ref 0.57–1.00)
Globulin, Total: 2.2 g/dL (ref 1.5–4.5)
Glucose: 125 mg/dL — ABNORMAL HIGH (ref 70–99)
Potassium: 5.6 mmol/L — ABNORMAL HIGH (ref 3.5–5.2)
Sodium: 123 mmol/L — ABNORMAL LOW (ref 134–144)
Total Protein: 6 g/dL (ref 6.0–8.5)
eGFR: 79 mL/min/1.73 (ref >59)

## 2024-06-22 LAB — THYROID PANEL WITH TSH
Free Thyroxine Index: 3.3 (ref 1.2–4.9)
T3 Uptake Ratio: 35 % (ref 24–39)
T4, Total: 9.3 ug/dL (ref 4.5–12.0)
TSH: 1.19 u[IU]/mL (ref 0.450–4.500)

## 2024-06-22 LAB — CBC WITH DIFFERENTIAL/PLATELET
Abs Immature Granulocytes: 0.07 K/uL (ref 0.00–0.07)
Basophils Absolute: 0 K/uL (ref 0.0–0.1)
Basophils Relative: 0 %
Eosinophils Absolute: 0.1 K/uL (ref 0.0–0.5)
Eosinophils Relative: 1 %
HCT: 40 % (ref 36.0–46.0)
Hemoglobin: 13.4 g/dL (ref 12.0–15.0)
Immature Granulocytes: 1 %
Lymphocytes Relative: 9 %
Lymphs Abs: 1 K/uL (ref 0.7–4.0)
MCH: 33.6 pg (ref 26.0–34.0)
MCHC: 33.5 g/dL (ref 30.0–36.0)
MCV: 100.3 fL — ABNORMAL HIGH (ref 80.0–100.0)
Monocytes Absolute: 1.2 K/uL — ABNORMAL HIGH (ref 0.1–1.0)
Monocytes Relative: 12 %
Neutro Abs: 8.2 K/uL — ABNORMAL HIGH (ref 1.7–7.7)
Neutrophils Relative %: 77 %
Platelets: 181 K/uL (ref 150–400)
RBC: 3.99 MIL/uL (ref 3.87–5.11)
RDW: 18.6 % — ABNORMAL HIGH (ref 11.5–15.5)
WBC: 10.6 K/uL — ABNORMAL HIGH (ref 4.0–10.5)
nRBC: 0 % (ref 0.0–0.2)

## 2024-06-22 LAB — TROPONIN I (HIGH SENSITIVITY): Troponin I (High Sensitivity): 4 ng/L (ref <18)

## 2024-06-22 LAB — BRAIN NATRIURETIC PEPTIDE: B Natriuretic Peptide: 1227 pg/mL — ABNORMAL HIGH (ref 0.0–100.0)

## 2024-06-22 LAB — MAGNESIUM: Magnesium: 2 mg/dL (ref 1.7–2.4)

## 2024-06-22 MED ORDER — SODIUM CHLORIDE 0.9% FLUSH
3.0000 mL | Freq: Two times a day (BID) | INTRAVENOUS | Status: DC
  Administered 2024-06-22 – 2024-06-24 (×5): 3 mL via INTRAVENOUS

## 2024-06-22 MED ORDER — FUROSEMIDE 10 MG/ML IJ SOLN
20.0000 mg | Freq: Once | INTRAMUSCULAR | Status: AC
  Administered 2024-06-22: 20 mg via INTRAVENOUS
  Filled 2024-06-22: qty 2

## 2024-06-22 MED ORDER — SODIUM CHLORIDE 0.9 % IV SOLN: 250.0000 mL | INTRAVENOUS | Status: AC | PRN

## 2024-06-22 MED ORDER — METOPROLOL SUCCINATE ER 50 MG PO TB24: 100.0000 mg | ORAL_TABLET | Freq: Two times a day (BID) | ORAL | Status: DC

## 2024-06-22 MED ORDER — HEPARIN SODIUM (PORCINE) 5000 UNIT/ML IJ SOLN
5000.0000 [IU] | Freq: Two times a day (BID) | INTRAMUSCULAR | Status: DC
  Administered 2024-06-22 – 2024-06-25 (×6): 5000 [IU] via SUBCUTANEOUS
  Filled 2024-06-22 (×5): qty 1

## 2024-06-22 MED ORDER — MIDODRINE HCL 5 MG PO TABS
2.5000 mg | ORAL_TABLET | Freq: Two times a day (BID) | ORAL | Status: DC
  Administered 2024-06-23 – 2024-06-25 (×5): 2.5 mg via ORAL
  Filled 2024-06-22 (×5): qty 1

## 2024-06-22 MED ORDER — SIMVASTATIN 20 MG PO TABS
20.0000 mg | ORAL_TABLET | Freq: Every day | ORAL | Status: DC
Start: 2024-06-22 — End: 2024-06-25
  Administered 2024-06-22 – 2024-06-24 (×3): 20 mg via ORAL
  Filled 2024-06-22 (×3): qty 1

## 2024-06-22 MED ORDER — ONDANSETRON HCL 4 MG PO TABS: 4.0000 mg | ORAL_TABLET | Freq: Three times a day (TID) | ORAL | Status: DC | PRN

## 2024-06-22 MED ORDER — SERTRALINE HCL 25 MG PO TABS
25.0000 mg | ORAL_TABLET | Freq: Every day | ORAL | Status: DC
Start: 2024-06-23 — End: 2024-06-25
  Administered 2024-06-23 – 2024-06-25 (×3): 25 mg via ORAL
  Filled 2024-06-22 (×3): qty 1

## 2024-06-22 MED ORDER — HYDRALAZINE HCL 10 MG PO TABS
10.0000 mg | ORAL_TABLET | Freq: Three times a day (TID) | ORAL | Status: DC
  Administered 2024-06-22 – 2024-06-25 (×8): 10 mg via ORAL
  Filled 2024-06-22 (×8): qty 1

## 2024-06-22 MED ORDER — ASPIRIN 81 MG PO TBEC
81.0000 mg | DELAYED_RELEASE_TABLET | Freq: Every day | ORAL | Status: DC
  Administered 2024-06-23 – 2024-06-25 (×3): 81 mg via ORAL
  Filled 2024-06-22 (×3): qty 1

## 2024-06-22 MED ORDER — ONDANSETRON HCL 4 MG/2ML IJ SOLN
4.0000 mg | Freq: Four times a day (QID) | INTRAMUSCULAR | Status: DC | PRN
  Administered 2024-06-24: 4 mg via INTRAVENOUS
  Filled 2024-06-22: qty 2

## 2024-06-22 MED ORDER — METOPROLOL SUCCINATE ER 50 MG PO TB24
150.0000 mg | ORAL_TABLET | Freq: Two times a day (BID) | ORAL | Status: DC
  Administered 2024-06-23 – 2024-06-25 (×5): 150 mg via ORAL
  Filled 2024-06-22 (×5): qty 3

## 2024-06-22 MED ORDER — ISOSORBIDE MONONITRATE ER 30 MG PO TB24
30.0000 mg | ORAL_TABLET | Freq: Every day | ORAL | Status: DC
  Administered 2024-06-22 – 2024-06-25 (×4): 30 mg via ORAL
  Filled 2024-06-22 (×4): qty 1

## 2024-06-22 MED ORDER — METOLAZONE 5 MG PO TABS
2.5000 mg | ORAL_TABLET | Freq: Once | ORAL | Status: AC
  Administered 2024-06-22: 2.5 mg via ORAL
  Filled 2024-06-22: qty 1

## 2024-06-22 MED ORDER — MECLIZINE HCL 12.5 MG PO TABS: 25.0000 mg | ORAL_TABLET | Freq: Three times a day (TID) | ORAL | Status: DC | PRN

## 2024-06-22 MED ORDER — HYDRALAZINE HCL 20 MG/ML IJ SOLN: 2.0000 mg | Freq: Four times a day (QID) | INTRAMUSCULAR | Status: DC | PRN

## 2024-06-22 MED ORDER — METOPROLOL TARTRATE 5 MG/5ML IV SOLN: 2.5000 mg | INTRAVENOUS | Status: DC | PRN

## 2024-06-22 MED ORDER — POLYETHYLENE GLYCOL 3350 17 G PO PACK: 17.0000 g | PACK | Freq: Two times a day (BID) | ORAL | Status: DC | PRN

## 2024-06-22 MED ORDER — ACETAMINOPHEN 325 MG PO TABS
650.0000 mg | ORAL_TABLET | Freq: Four times a day (QID) | ORAL | Status: DC | PRN
  Administered 2024-06-22 – 2024-06-23 (×3): 650 mg via ORAL
  Filled 2024-06-22 (×4): qty 2

## 2024-06-22 MED ORDER — TRAZODONE HCL 50 MG PO TABS
50.0000 mg | ORAL_TABLET | Freq: Every evening | ORAL | Status: DC | PRN
  Administered 2024-06-22: 50 mg via ORAL
  Filled 2024-06-22: qty 1

## 2024-06-22 MED ORDER — FUROSEMIDE 10 MG/ML IJ SOLN
40.0000 mg | Freq: Every day | INTRAMUSCULAR | Status: DC
  Administered 2024-06-23: 40 mg via INTRAVENOUS
  Filled 2024-06-22: qty 4

## 2024-06-22 MED ORDER — PANTOPRAZOLE SODIUM 40 MG PO TBEC
40.0000 mg | DELAYED_RELEASE_TABLET | Freq: Every day | ORAL | Status: DC
  Administered 2024-06-22 – 2024-06-25 (×4): 40 mg via ORAL
  Filled 2024-06-22 (×4): qty 1

## 2024-06-22 MED ORDER — ALUM & MAG HYDROXIDE-SIMETH 200-200-20 MG/5ML PO SUSP: 30.0000 mL | ORAL | Status: DC | PRN

## 2024-06-22 MED ORDER — SODIUM CHLORIDE 0.9% FLUSH: 3.0000 mL | INTRAVENOUS | Status: DC | PRN

## 2024-06-22 NOTE — Telephone Encounter (Signed)
 Copied from CRM #[id number]. Topic: General - Other >> Jun 22, 2024  3:02 PM Turkey B wrote: Reason for CRM: caroline, patient's PA called in, states [facility] wants lab results of patient faxed to them. Fx is  [phone]

## 2024-06-22 NOTE — ED Notes (Signed)
 Report given to floor RN

## 2024-06-22 NOTE — Medical Student Note (Incomplete)
 [facility] DEPT Provider Student Note For educational purposes for Medical, PA and NP students only and not part of the legal medical record.   CSN: [id number] Arrival date & time: 06/22/24  1540      History   Chief Complaint Chief Complaint  Patient presents with   abnormal labs    HPI Norma Barajas is a 83 y.o. female w/ PMH of CHF, CKD, and Afib presenting today d/t referral from PCP for hyponatremia. Patient was seen here recently for the same thing. She had her blood rechecked yesterday and received a call today w/ results suggesting the need for sodium repletion (123 mmol/L) in the ED. Patient reports no new symptoms associated w/ her conditions but generally feels unwell. Two weeks ago the patient fell and bruised her right kidney; patient today complaining of right back/flank pain associated with that; she says it hurts deep inside. Additionally, the patient reported decreased appetite for food and water  over the past month; patient drinks Ensure. PCP suggests that hyponatremia is d/t decreased protein consumption. Patient also reporting increased abdominal swelling/bloating and BLE edema that worsened over the past week. Normal BM today & yesterday. Denies hematuria, N/V, diarrhea, new onset chest pain, AMS, dizziness, muscle weakness. Patient says that she felt temporarily better after her treatment two weeks ago for hyponatremia.   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 Nonobstructive minimal CAD 10/2005.   Depression    Diastolic dysfunction    Grade 1. Ejection fraction 60-65%.   Dysrhythmia    a fib   Erosive esophagitis    Essential hypertension    Fracture of ramus of right pubis with routine healing 12/09/2020   GERD  (gastroesophageal reflux disease)    Grade III hemorrhoids    History of hiatal hernia    History of prediabetes    Hypercholesterolemia    Internal hemorrhoids with complication 07/29/2008   OCT 2015 FLEX SIG/IH BANDING     Intertrochanteric fracture of femur (HCC)    5/16 - 03/26/2021 fracture sustained in a mechanical fall.  IM nailing performed 5/17 by Dr Oneil Horde Warfarin held because of posttraumatic pelvic hematoma.  Postop DVT with 81 mg aspirin  twice daily x28 days.   Major depressive disorder, recurrent, moderate (HCC) 12/09/2020   Migraine    used to have them right bad; I don't now (04/11/2015)   MITRAL REGURGITATION 04/27/2010   Qualifier: Diagnosis of  By: Delford, MD, CODY Maude Dunnings    Osteoporosis    Paroxysmal atrial fibrillation [facility])    Pelvic hematoma, female 03/27/2021   Sustained in mechanical fall.  Warfarin held.  IM nailing performed 5/17 with postop DVT prophylaxis 81 mg twice daily x28 days.   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   Hypertensive heart disease with chronic diastolic congestive heart failure (HCC) 03/27/2021   Gallbladder sludge 03/27/2021   Aortic atherosclerosis (HCC) 03/27/2021   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) 10/23/2020   Severe tricuspid valve regurgitation 10/23/2020   Hypokalemia 09/16/2020   Early satiety 07/15/2020   Difficulty sleeping  06/22/2019   Paroxysmal atrial fibrillation (HCC) 08/21/2015   Lumbar pain 08/07/2015   Vitamin D  deficiency 11/27/2014   Drug induced constipation 12/14/2013   HTN (hypertension) 07/24/2013   Coronary atherosclerosis of native coronary artery 07/24/2013   Dysphagia 09/28/2012   Pulmonary nodule 02/18/2011   Hyperlipidemia 07/29/2008   GAD (generalized anxiety disorder) 07/29/2008   Primary hypertension 07/29/2008   GERD 07/29/2008   Osteoporosis 07/29/2008    Past Surgical History:  Procedure Laterality Date   BRAVO PH STUDY  11/15/2012   Procedure: BRAVO PH STUDY;  Surgeon: Margo LITTIE Haddock, MD;  Location: [facility] ENDO SUITE;  Service: Endoscopy;;   CARDIOVERSION N/A 05/16/2020   Procedure: CARDIOVERSION;  Surgeon: Kate Lonni LITTIE, MD;  Location: [facility] END[facility]COPY;  Service: Cardiovascular;  Laterality: N/A;   CATARACT EXTRACTION W/ INTRAOCULAR LENS  IMPLANT, BILATERAL Bilateral    COLONOSCOPY  2008   Dr. Haddock: internal hemorrhoids    DILATION AND CURETTAGE OF UTERUS     ESOPHAGEAL DILATION N/A 06/16/2021   Procedure: ESOPHAGEAL DILATION;  Surgeon: Eartha Angelia Sieving, MD;  Location: [facility] ENDO SUITE;  Service: Gastroenterology;  Laterality: N/A;   ESOPHAGOGASTRODUODENOSCOPY (EGD) WITH ESOPHAGEAL DILATION  2001   Dr. Debrah: erosive esophagitis, esophageal stricture, duodenitis, s/p Savary dilation   ESOPHAGOGASTRODUODENOSCOPY (EGD) WITH ESOPHAGEAL DILATION  11/15/2012   DOQ:Zdneyjhzjo web was found & MOST LIKELY CAUSE FOR DYAPHAGIA/Polyp was found in the gastric body and gastric fundus/ gastritis on bx   ESOPHAGOGASTRODUODENOSCOPY (EGD) WITH PROPOFOL  N/A 06/16/2021   Procedure: ESOPHAGOGASTRODUODENOSCOPY (EGD) WITH PROPOFOL ;  Surgeon: Eartha Angelia Sieving, MD;  Location: [facility] END[facility] SUITE;  Service: Gastroenterology;  Laterality: N/A;  8:15   EYE SURGERY Bilateral    laser OR after cataract OR; cause I couldn't see   FLEXIBLE SIGMOIDOSCOPY N/A 08/22/2014   mild diverticulosis in  sigmoid, moderate sized Grade 3 hemorrhoids s/p banding X 3.    FRACTURE SURGERY Right    below the knee - 2 bones broke has plates   HEMORRHOID BANDING N/A 08/22/2014   Procedure: HEMORRHOID BANDING;  Surgeon: Margo LITTIE Haddock, MD;  Location: [facility] END[facility] SU[facility];  Service: Endoscopy;  Laterality: N/A;   HEMORRHOID SURGERY N/A 03/30/2018   Procedure: EXTENSIVE HEMORRHOIDECTOMY;  Surgeon: Kallie Manuelita [name], MD;  Location: [facility] ORS;  Service: General;  Laterality: N/A;   INTRAMEDULLARY (IM) NAIL INTERTROCHANTERIC Right 03/24/2021   Procedure: INTRAMEDULLARY (IM) NAIL INTERTROCHANTRIC;  Surgeon: Onesimo Oneil LABOR, MD;  Location: [facility] ORS;  Service: Orthopedics;  Laterality: Right;   OPEN REDUCTION INTERNAL FIXATION (ORIF) TIBIA/FIBULA FRACTURE Right 2013   broke tibia and fibula after falling down stairs   PROLAPSED UTERINE FIBROID LIGATION  2015   TOTAL ABDOMINAL HYSTERECTOMY      OB History     Gravida  1   Para  1   Term  1   Preterm      AB      Living  1      SAB      IAB      Ectopic      Multiple  Live Births               Home Medications    Prior to Admission medications   Medication Sig Start Date End Date Taking? Authorizing Provider  acetaminophen  (TYLENOL ) 325 MG tablet Take 2 tablets (650 mg total) by mouth every 6 (six) hours as needed for mild pain (pain score 1-3) or fever (or Fever >/= 101). 06/14/24   Emokpae, Courage, MD  aluminum-magnesium  hydroxide 200-200 MG/5ML suspension Take 30 mLs by mouth as needed for indigestion (heartburn). Up to 4 times a day in 24 hours.    [provider]  aspirin  EC (ASPIRIN  LOW DOSE) 81 MG tablet Take 1 tablet (81 mg total) by mouth daily with breakfast. Swallow whole. 06/14/24   Pearlean Manus, MD  cholecalciferol  (VITAMIN D3) 25 MCG (1000 UNIT) tablet Take 1,000 Units by mouth in the morning.    [provider]  diltiazem  (CARDIZEM  CD) 240 MG 24 hr capsule Take 1 capsule (240 mg total) by mouth daily.  03/01/24   Severa Rock HERO, FNP  feeding supplement (ENSURE PLUS HIGH PROTEIN) LIQD Take 237 mLs by mouth 2 (two) times daily between meals. 06/14/24   Pearlean Manus, MD  meclizine  (ANTIVERT ) 25 MG tablet Take 1 tablet (25 mg total) by mouth 3 (three) times daily as needed for dizziness. 01/04/24   Severa Rock HERO, FNP  metoprolol  succinate (TOPROL -XL) 100 MG 24 hr tablet Take 1 tablet (100 mg total) by mouth 2 (two) times daily. Take with or immediately following a meal. 03/01/24   Rakes, Rock HERO, FNP  midodrine  (PROAMATINE ) 2.5 MG tablet Take 1 tablet (2.5 mg total) by mouth 2 (two) times daily with a meal. 03/01/24   Rakes, Rock HERO, FNP  omeprazole  (PRILOSEC) 20 MG capsule Take 1 capsule (20 mg total) by mouth daily. 03/01/24   Severa Rock HERO, FNP  ondansetron  (ZOFRAN ) 4 MG tablet Take 1 tablet (4 mg total) by mouth every 8 (eight) hours as needed for nausea or vomiting. 01/04/24   Rakes, Rock HERO, FNP  polyethylene glycol (MIRALAX  / GLYCOLAX ) 17 g packet Take 17 g by mouth 2 (two) times daily as needed for mild constipation. 08/10/23   Von Bellis, MD  potassium chloride  (KLOR-CON  M) 10 MEQ tablet TAKE 1 TABLET EVERY DAY (TAKE WHILE TAKING DEMADEX /TORSEMIDE  AS DIRECTED) 03/06/24   Severa Rock HERO, FNP  sertraline  (ZOLOFT ) 25 MG tablet Take 1 tablet (25 mg total) by mouth daily. 06/21/24   Severa Rock HERO, FNP  simvastatin  (ZOCOR ) 20 MG tablet Take 1 tablet (20 mg total) by mouth daily. Patient taking differently: Take 20 mg by mouth at bedtime. 03/01/24   Severa Rock HERO, FNP  torsemide  (DEMADEX ) 20 MG tablet Take 1 tablet (20 mg total) by mouth every Monday, Wednesday, and Friday. 06/15/24   Pearlean Manus, MD  traZODone  (DESYREL ) 50 MG tablet Take 1 tablet (50 mg total) by mouth at bedtime as needed for sleep. 03/01/24   Severa Rock HERO, FNP    Family History Family History  Problem Relation Age of Onset   Colon cancer Mother 80   Heart disease Mother    Osteoporosis Mother    Hip fracture Mother     Stroke Sister    Diabetes Sister    Osteoporosis Sister    Arthritis Sister    Uterine cancer Sister    Stroke Sister    Heart disease Father    Hyperlipidemia Brother    Hypertension Brother    Heart  disease Brother    Stroke Brother    Heart disease Sister    Dementia Sister    Diabetes Son    Stroke Son    Heart attack Neg Hx     Social History Social History   Tobacco Use   Smoking status: Never   Smokeless tobacco: Never  Vaping Use   Vaping status: Never Used  Substance Use Topics   Alcohol use: No    Alcohol/week: 0.0 standard drinks of alcohol   Drug use: No     Allergies   Codeine, Triamterene-hctz, Atorvastatin, Crestor [rosuvastatin calcium ], Doxycycline , Morphine , and Risedronate sodium   Review of Systems Review of Systems   Physical Exam Updated Vital Signs BP (!) 150/94 (BP Location: Left Arm)   Pulse 75   Temp 97.9 F (36.6 C) (Oral)   Resp 20   Ht 4' 8 (1.422 m)   Wt 46.7 kg   SpO2 93%   BMI 23.09 kg/m   Physical Exam   ED Treatments / Results  Labs (all labs ordered are listed, but only abnormal results are displayed) Labs Reviewed  COMPREHENSIVE METABOLIC PANEL WITH GFR - Abnormal; Notable for the following components:      Result Value   Sodium 127 (*)    Chloride 92 (*)    Glucose, Bld 123 (*)    Calcium  8.2 (*)    Total Protein 6.1 (*)    Albumin 3.3 (*)    Alkaline Phosphatase 146 (*)    Total Bilirubin 1.3 (*)    All other components within normal limits  CBC WITH DIFFERENTIAL/PLATELET - Abnormal; Notable for the following components:   WBC 10.6 (*)    MCV 100.3 (*)    RDW 18.6 (*)    Neutro Abs 8.2 (*)    Monocytes Absolute 1.2 (*)    All other components within normal limits  MAGNESIUM     EKG  Radiology No results found.  Procedures Procedures (including critical care time)  Medications Ordered in ED Medications - No data to display   Initial Impression / Assessment and Plan / ED Course  I have  reviewed the triage vital signs and the nursing notes.  Pertinent labs & imaging results that were available during my care of the patient were reviewed by me and considered in my medical decision making (see chart for details).     ***  Final Clinical Impressions(s) / ED Diagnoses   Final diagnoses:  None    New Prescriptions New Prescriptions   No medications on file

## 2024-06-22 NOTE — H&P (Addendum)
 History and Physical    Norma Barajas FMW:[id number] DOB: 13-May-1941 DOA: 06/22/2024  PCP: Severa Rock HERO, FNP (Confirm with patient/family/NH records and if not entered, this has to be entered at [facility] point of entry) Patient coming from: SNF  I have personally briefly reviewed patient's old medical records in [facility]  Chief Complaint: Increasing leg swelling  HPI: Norma Barajas is a 83 y.o. female with medical history significant of chronic HFpEF, pulmonary hypertension with cor pulmonale, PAF not on anticoagulation, CAD, HTN sent from nursing home for evaluation of worsening of leg swelling and worsening of hyponatremia.  Patient has hard hearing, daughter at bedside gave history.  Patient has a history of CHF taking torsemide  twice daily, starting 3 weeks ago patient started develop worsening of peripheral edema and nursing home decided to increase torsemide  to 3 times a day for the last 2 weeks.  However patient continued to gain weight and worsening of peripheral edema and today blood work showed worsening of hyponatremia and patient sent to ED.  Patient denied any cough shortness of breath chest pains fever chills.  ED Course: Afebrile, blood pressure 130/80 O2 saturation 94% on room air.  Blood work showed sodium 127, BUN 22 creatinine 0.8 glucose 123 WBC 10.6 hemoglobin 13.4.  Chest x-ray negative for acute findings, patient was given IV Lasix  20 mg in the ED.  Review of Systems: As per HPI otherwise 14 point review of systems negative.    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 Nonobstructive minimal CAD 10/2005.   Depression    Diastolic dysfunction    Grade 1. Ejection fraction 60-65%.   Dysrhythmia    a fib   Erosive esophagitis    Essential  hypertension    Fracture of ramus of right pubis with routine healing 12/09/2020   GERD (gastroesophageal reflux disease)    Grade III hemorrhoids    History of hiatal hernia    History of prediabetes    Hypercholesterolemia    Internal hemorrhoids with complication 07/29/2008   OCT 2015 FLEX SIG/IH BANDING     Intertrochanteric fracture of femur (HCC)    5/16 - 03/26/2021 fracture sustained in a mechanical fall.  IM nailing performed 5/17 by Dr Oneil Horde Warfarin held because of posttraumatic pelvic hematoma.  Postop DVT with 81 mg aspirin  twice daily x28 days.   Major depressive disorder, recurrent, moderate (HCC) 12/09/2020   Migraine    used to have them right bad; I don't now (04/11/2015)   MITRAL REGURGITATION 04/27/2010   Qualifier: Diagnosis of  By: Delford, MD, CODY Maude Dunnings    Osteoporosis    Paroxysmal atrial fibrillation [facility])    Pelvic hematoma, female 03/27/2021   Sustained in mechanical fall.  Warfarin held.  IM nailing performed 5/17 with postop DVT prophylaxis 81 mg twice daily x28 days.   PONV (postoperative nausea and vomiting)    Vertigo     Past Surgical History:  Procedure Laterality Date   BRAVO [facility] STUDY  11/15/2012   Procedure: BRAVO PH STUDY;  Surgeon: Margo LITTIE Haddock, MD;  Location: [facility] ENDO [facility];  Service: Endoscopy;;   CARDIOVERSION N/A 05/16/2020   Procedure: CARDIOVERSION;  Surgeon: Kate Lonni LITTIE, MD;  Location: [facility] ENDOSCOPY;  Service: Cardiovascular;  Laterality: N/A;   CATARACT EXTRACTION W/ INTRAOCULAR LENS  IMPLANT, BILATERAL Bilateral    COLONOSCOPY  2008   Dr. Harvey: internal hemorrhoids    DILATION AND CURETTAGE OF UTERUS     ESOPHAGEAL DILATION N/A 06/16/2021   Procedure: ESOPHAGEAL DILATION;  Surgeon: Eartha Angelia Sieving, MD;  Location: [facility] END[facility];  Service: Gastroenterology;  Laterality: N/A;   ESOPHAGOGASTRODUODENOSCOPY (EGD) WITH ESOPHAGEAL DILATION  2001   Dr. Debrah: erosive esophagitis, esophageal stricture,  duodenitis, s/p Savary dilation   ESOPHAGOGASTRODUODENOSCOPY (EGD) WITH ESOPHAGEAL DILATION  11/15/2012   DOQ:Zdneyjhzjo web was found & MOST LIKELY CAUSE FOR DYAPHAGIA/Polyp was found in the gastric body and gastric fundus/ gastritis on bx   ESOPHAGOGASTRODUODENOSCOPY (EGD) WITH PROPOFOL  N/A 06/16/2021   Procedure: ESOPHAGOGASTRODUODENOSCOPY (EGD) WITH PROPOFOL ;  Surgeon: Eartha Angelia Sieving, MD;  Location: [facility];  Service: Gastroenterology;  Laterality: N/A;  8:15   EYE SURGERY Bilateral    laser OR after cataract OR; cause I couldn't see   FLEXIBLE SIGMOIDOSCOPY N/A 08/22/2014   mild diverticulosis in sigmoid, moderate sized Grade 3 hemorrhoids s/p banding X 3.    FRACTURE SURGERY Right    below the knee - 2 bones broke has plates   HEMORRHOID BANDING N/A 08/22/2014   Procedure: HEMORRHOID BANDING;  Surgeon: Margo LITTIE Harvey, MD;  [facility];  Service: Endoscopy;  Laterality: N/A;   HEMORRHOID SURGERY N/A 03/30/2018   Procedure: EXTENSIVE HEMORRHOIDECTOMY;  Surgeon: Kallie Manuelita [name], MD;  Location: [facility] ORS;  Service: General;  Laterality: N/A;   INTRAMEDULLARY (IM) NAIL INTERTROCHANTERIC Right 03/24/2021   Procedure: INTRAMEDULLARY (IM) NAIL INTERTROCHANTRIC;  Surgeon: Onesimo Oneil LABOR, MD;  Location: [facility] ORS;  Service: Orthopedics;  Laterality: Right;   OPEN REDUCTION INTERNAL FIXATION (ORIF) TIBIA/FIBULA FRACTURE Right 2013   broke tibia and fibula after falling down stairs   PROLAPSED UTERINE FIBROID LIGATION  2015   TOTAL ABDOMINAL HYSTERECTOMY       reports that she has never smoked. She has never used smokeless tobacco. She reports that she does not drink alcohol and does not use drugs.  Allergies  Allergen Reactions   Codeine Nausea And Vomiting   Triamterene-Hctz Other (See Comments)    Weakness    Atorvastatin Other (See Comments)    Myalgias    Crestor [Rosuvastatin Calcium ] Other (See Comments)    Weakness    Doxycycline  Other (See Comments)     Chest congestion   Morphine  Nausea Only   Risedronate Sodium Other (See Comments)    ACTONEL - reflux    Family History  Problem Relation Age of Onset   Colon cancer Mother 71   Heart disease Mother    Osteoporosis Mother    Hip fracture Mother    Stroke Sister    Diabetes Sister    Osteoporosis Sister    Arthritis Sister    Uterine cancer Sister    Stroke Sister    Heart disease Father    Hyperlipidemia Brother    Hypertension Brother    Heart disease Brother    Stroke Brother    Heart disease Sister    Dementia Sister    Diabetes Son    Stroke Son    Heart attack Neg Hx     Prior to Admission medications   Medication Sig Start Date End Date Taking? Authorizing Provider  acetaminophen  (TYLENOL ) 325 MG tablet Take 2 tablets (650 mg total) by mouth every 6 (six) hours as needed  for mild pain (pain score 1-3) or fever (or Fever >/= 101). 06/14/24  Yes Emokpae, Courage, MD  aluminum-magnesium  hydroxide 200-200 MG/5ML suspension Take 30 mLs by mouth as needed for indigestion (heartburn). Up to 4 times a day in 24 hours.   Yes [provider]  aspirin  EC (ASPIRIN  LOW DOSE) 81 MG tablet Take 1 tablet (81 mg total) by mouth daily with breakfast. Swallow whole. 06/14/24  Yes Emokpae, Courage, MD  cholecalciferol  (VITAMIN D3) 25 MCG (1000 UNIT) tablet Take 1,000 Units by mouth in the morning.   Yes [provider]  diltiazem  (CARDIZEM  CD) 240 MG 24 hr capsule Take 1 capsule (240 mg total) by mouth daily. 03/01/24  Yes Rakes, Rock HERO, FNP  feeding supplement (ENSURE PLUS HIGH PROTEIN) LIQD Take 237 mLs by mouth 2 (two) times daily between meals. Patient taking differently: Take 237 mLs by mouth 3 (three) times daily between meals. 06/14/24  Yes Emokpae, Courage, MD  meclizine  (ANTIVERT ) 25 MG tablet Take 1 tablet (25 mg total) by mouth 3 (three) times daily as needed for dizziness. 01/04/24  Yes Rakes, Rock HERO, FNP  metoprolol  succinate (TOPROL -XL) 100 MG 24 hr tablet Take 1  tablet (100 mg total) by mouth 2 (two) times daily. Take with or immediately following a meal. 03/01/24  Yes Rakes, Rock HERO, FNP  midodrine  (PROAMATINE ) 2.5 MG tablet Take 1 tablet (2.5 mg total) by mouth 2 (two) times daily with a meal. 03/01/24  Yes Rakes, Rock HERO, FNP  naproxen  (NAPROSYN ) 375 MG tablet Take 375 mg by mouth 2 (two) times daily with a meal.   Yes [provider]  omeprazole  (PRILOSEC) 20 MG capsule Take 1 capsule (20 mg total) by mouth daily. 03/01/24  Yes Rakes, Rock HERO, FNP  ondansetron  (ZOFRAN ) 4 MG tablet Take 1 tablet (4 mg total) by mouth every 8 (eight) hours as needed for nausea or vomiting. 01/04/24  Yes Rakes, Rock HERO, FNP  polyethylene glycol (MIRALAX  / GLYCOLAX ) 17 g packet Take 17 g by mouth 2 (two) times daily as needed for mild constipation. 08/10/23  Yes Von Bellis, MD  potassium chloride  (KLOR-CON  M) 10 MEQ tablet TAKE 1 TABLET EVERY DAY (TAKE WHILE TAKING DEMADEX /TORSEMIDE  AS DIRECTED) 03/06/24  Yes Rakes, Rock HERO, FNP  sertraline  (ZOLOFT ) 25 MG tablet Take 1 tablet (25 mg total) by mouth daily. 06/21/24  Yes Rakes, Rock HERO, FNP  simvastatin  (ZOCOR ) 20 MG tablet Take 1 tablet (20 mg total) by mouth daily. Patient taking differently: Take 20 mg by mouth at bedtime. 03/01/24  Yes Severa Rock HERO, FNP  torsemide  (DEMADEX ) 20 MG tablet Take 1 tablet (20 mg total) by mouth every Monday, Wednesday, and Friday. 06/15/24  Yes Pearlean Manus, MD  traZODone  (DESYREL ) 50 MG tablet Take 1 tablet (50 mg total) by mouth at bedtime as needed for sleep. 03/01/24  Yes Severa Rock HERO, FNP    Physical Exam: Vitals:   06/22/24 1700 06/22/24 1730 06/22/24 1900 06/22/24 1915  BP: (!) 153/96 (!) 144/93 (!) 148/88 (!) 144/88  Pulse: 88 95 91 81  Resp: (!) 27 (!) 23 (!) 28 (!) 29  Temp:      TempSrc:      SpO2: 94% 94% 94% 93%  Weight:      Height:        Constitutional: NAD, calm, comfortable Vitals:   06/22/24 1700 06/22/24 1730 06/22/24 1900 06/22/24 1915  BP: (!) 153/96  (!) 144/93 (!) 148/88 (!) 144/88  Pulse: 88 95  91 81  Resp: (!) 27 (!) 23 (!) 28 (!) 29  Temp:      TempSrc:      SpO2: 94% 94% 94% 93%  Weight:      Height:       Eyes: PERRL, lids and conjunctivae normal ENMT: Mucous membranes are moist. Posterior pharynx clear of any exudate or lesions.Normal dentition.  Neck: normal, supple, no masses, no thyromegaly Respiratory: clear to auscultation bilaterally, no wheezing, no crackles. Normal respiratory effort. No accessory muscle use.  Cardiovascular: Irregular heart rate, no murmurs / rubs / gallops. 2+ extremity edema. 2+ pedal pulses. No carotid bruits.  Abdomen: no tenderness, no masses palpated. No hepatosplenomegaly. Bowel sounds positive.  Musculoskeletal: no clubbing / cyanosis. No joint deformity upper and lower extremities. Good ROM, no contractures. Normal muscle tone.  Skin: no rashes, lesions, ulcers. No induration Neurologic: CN 2-12 grossly intact. Sensation intact, DTR normal. Strength 5/5 in all 4.  Psychiatric: Normal judgment and insight. Alert and oriented x 3. Normal mood.     Labs on Admission: I have personally reviewed following labs and imaging studies  CBC: Recent Labs  Lab 06/21/24 1120 06/22/24 1601  WBC 9.8 10.6*  NEUTROABS 7.6* 8.2*  HGB 13.5 13.4  HCT 41.2 40.0  MCV 104* 100.3*  PLT 194 181   Basic Metabolic Panel: Recent Labs  Lab 06/21/24 1120 06/22/24 1601  NA 123* 127*  K 5.6* 4.5  CL 90* 92*  CO2 19* 23  GLUCOSE 125* 123*  BUN 18 22  CREATININE 0.74 0.84  CALCIUM  8.7 8.2*  MG  --  2.0   GFR: Estimated Creatinine Clearance: 31.3 mL/min (by C-G formula based on SCr of 0.84 mg/dL). Liver Function Tests: Recent Labs  Lab 06/21/24 1120 06/22/24 1601  AST 22 20  ALT 22 20  ALKPHOS 164* 146*  BILITOT 0.8 1.3*  PROT 6.0 6.1*  ALBUMIN 3.8 3.3*   No results for input(s): LIPASE, AMYLASE in the last 168 hours. No results for input(s): AMMONIA in the last 168 hours. Coagulation  Profile: No results for input(s): INR, PROTIME in the last 168 hours. Cardiac Enzymes: No results for input(s): CKTOTAL, CKMB, CKMBINDEX, TROPONINI in the last 168 hours. BNP (last 3 results) No results for input(s): PROBNP in the last 8760 hours. HbA1C: No results for input(s): HGBA1C in the last 72 hours. CBG: No results for input(s): GLUCAP in the last 168 hours. Lipid Profile: No results for input(s): CHOL, HDL, LDLCALC, TRIG, CHOLHDL, LDLDIRECT in the last 72 hours. Thyroid  Function Tests: Recent Labs    06/21/24 1120  TSH 1.190  T4TOTAL 9.3   Anemia Panel: Recent Labs    06/21/24 1120  VITAMINB12 721  FOLATE >20.0  FERRITIN 369*  TIBC 325  IRON  87  RETICCTPCT 7.3*   Urine analysis:    Component Value Date/Time   COLORURINE YELLOW 06/12/2024 1322   APPEARANCEUR CLEAR 06/12/2024 1322   APPEARANCEUR Clear 09/06/2023 1126   LABSPEC 1.017 06/12/2024 1322   PHURINE 5.0 06/12/2024 1322   GLUCOSEU NEGATIVE 06/12/2024 1322   HGBUR NEGATIVE 06/12/2024 1322   BILIRUBINUR NEGATIVE 06/12/2024 1322   BILIRUBINUR Negative 09/06/2023 1126   KETONESUR NEGATIVE 06/12/2024 1322   PROTEINUR NEGATIVE 06/12/2024 1322   UROBILINOGEN negative 02/06/2015 1252   NITRITE NEGATIVE 06/12/2024 1322   LEUKOCYTESUR NEGATIVE 06/12/2024 1322    Radiological Exams on Admission: DG Chest 1 View Result Date: 06/22/2024 CLINICAL DATA:  edema EXAM: CHEST  1 VIEW COMPARISON:  June 12, 2024  FINDINGS: Low lung volumes with chronic interstitial opacities throughout both lungs. Nodular opacity in the right lung base. No pleural effusion or pneumothorax. Mild cardiomegaly. Tortuous aorta with aortic atherosclerosis. No acute fracture or destructive lesions. Multilevel thoracic osteophytosis. Osteopenia. IMPRESSION: Nodular opacity in the right lung base, which may represent atelectasis or a developing bronchopneumonia, in the correct clinical context. Electronically Signed    By: Rogelia Myers M.D.   On: 06/22/2024 17:20    EKG: Independently reviewed.  Rate controlled A-fib, no acute ST changes.  Assessment/Plan Principal Problem:   CHF (congestive heart failure) (HCC) Active Problems:   Acute on chronic diastolic CHF (congestive heart failure) (HCC)  (please populate well all problems here in Problem List. (For example, if patient is on BP meds at home and you resume or decide to hold them, it is a problem that needs to be her. Same for CAD, COPD, HLD and so on)  Acute on chronic HFpEF decompensation Acute on chronic right-sided CHF/cor pulmonale decompensation - Failed outpatient management - Increase diuresis to IV Lasix  40 mg daily, will give 1 dose of metolazone  - Change BP/CHF medications, hold off Cardizem  due to increasing peripheral edema, increase metoprolol  from 100 to 150 mg daily  Hyponatremia, acute on chronic - Hypervolemic, fluid overload - Worsening of sodium level secondary to fluid retention from CHF, sedation, management as above  HTN - Hold off Cardizem  due to worsening of peripheral edema - Increase metoprolol  - Start hydralazine  plus Imdur  regimen  PAF - Rate control as above - Not on anticoagulation due to history of unsteady gait and frequent falls.  According to cardiologist note, not a candidate for Watchman procedure due to overall condition and frailty  Deconditioning - PT evaluation  Total time spent on patient care 75 minutes.  DVT prophylaxis: Heparin  subcu Code Status: DNR/DNI Family Communication: Daughter at bedside Disposition Plan: Patient is sick with CHF decompensation failed outpatient management requiring IV diuresis while monitoring sodium level and kidney function surgery, expect more than 2 midnight hospital stay Consults called: None Admission status: Telemetry admission   Cort ONEIDA Mana MD [facility] Pager [phone]  06/22/2024, 7:37 PM

## 2024-06-22 NOTE — ED Provider Notes (Signed)
 [facility] EMERGENCY DEPARTMENT AT [facility] Provider Note   CSN: [id number] Arrival date & time: 06/22/24  1540     Patient presents with: abnormal labs   Emaleigh CORAYMA CASHATT is a 83 y.o. female.   Patient is an 83 year old female who presents to the emergency department from her long-term care facility secondary to low sodium.  Patient notes that she continues to have ongoing pain to the right side of her back secondary to a recent fall with some associated generalized weakness.  Patient does also notes that she has had some increased welling to bilateral extremities but denies any pain in this area.  She has been seen by her primary care doctor secondary to this who noted that the swelling may be secondary to her low protein levels.  Patient denies any active chest pain or shortness of breath at this point.  She has had no recent falls.  Family does note that she has had poor p.o. intake.        Prior to Admission medications   Medication Sig Start Date End Date Taking? Authorizing Provider  acetaminophen  (TYLENOL ) 325 MG tablet Take 2 tablets (650 mg total) by mouth every 6 (six) hours as needed for mild pain (pain score 1-3) or fever (or Fever >/= 101). 06/14/24   Emokpae, Courage, MD  aluminum-magnesium  hydroxide 200-200 MG/5ML suspension Take 30 mLs by mouth as needed for indigestion (heartburn). Up to 4 times a day in 24 hours.    [provider]  aspirin  EC (ASPIRIN  LOW DOSE) 81 MG tablet Take 1 tablet (81 mg total) by mouth daily with breakfast. Swallow whole. 06/14/24   Pearlean Manus, MD  cholecalciferol  (VITAMIN D3) 25 MCG (1000 UNIT) tablet Take 1,000 Units by mouth in the morning.    [provider]  diltiazem  (CARDIZEM  CD) 240 MG 24 hr capsule Take 1 capsule (240 mg total) by mouth daily. 03/01/24   Severa Rock HERO, FNP  feeding supplement (ENSURE PLUS HIGH PROTEIN) LIQD Take 237 mLs by mouth 2 (two) times daily between meals. 06/14/24   Pearlean Manus,  MD  meclizine  (ANTIVERT ) 25 MG tablet Take 1 tablet (25 mg total) by mouth 3 (three) times daily as needed for dizziness. 01/04/24   Severa Rock HERO, FNP  metoprolol  succinate (TOPROL -XL) 100 MG 24 hr tablet Take 1 tablet (100 mg total) by mouth 2 (two) times daily. Take with or immediately following a meal. 03/01/24   Rakes, Rock HERO, FNP  midodrine  (PROAMATINE ) 2.5 MG tablet Take 1 tablet (2.5 mg total) by mouth 2 (two) times daily with a meal. 03/01/24   Rakes, Rock HERO, FNP  omeprazole  (PRILOSEC) 20 MG capsule Take 1 capsule (20 mg total) by mouth daily. 03/01/24   Severa Rock HERO, FNP  ondansetron  (ZOFRAN ) 4 MG tablet Take 1 tablet (4 mg total) by mouth every 8 (eight) hours as needed for nausea or vomiting. 01/04/24   Rakes, Rock HERO, FNP  polyethylene glycol (MIRALAX  / GLYCOLAX ) 17 g packet Take 17 g by mouth 2 (two) times daily as needed for mild constipation. 08/10/23   Von Bellis, MD  potassium chloride  (KLOR-CON  M) 10 MEQ tablet TAKE 1 TABLET EVERY DAY (TAKE WHILE TAKING DEMADEX /TORSEMIDE  AS DIRECTED) 03/06/24   Severa Rock HERO, FNP  sertraline  (ZOLOFT ) 25 MG tablet Take 1 tablet (25 mg total) by mouth daily. 06/21/24   Severa Rock HERO, FNP  simvastatin  (ZOCOR ) 20 MG tablet Take 1 tablet (20 mg total) by mouth daily. Patient  taking differently: Take 20 mg by mouth at bedtime. 03/01/24   Severa Rock HERO, FNP  torsemide  (DEMADEX ) 20 MG tablet Take 1 tablet (20 mg total) by mouth every Monday, Wednesday, and Friday. 06/15/24   Pearlean Manus, MD  traZODone  (DESYREL ) 50 MG tablet Take 1 tablet (50 mg total) by mouth at bedtime as needed for sleep. 03/01/24   Severa Rock HERO, FNP    Allergies: Codeine, Triamterene-hctz, Atorvastatin, Crestor [rosuvastatin calcium ], Doxycycline , Morphine , and Risedronate sodium    Review of Systems  Constitutional:  Positive for fatigue.  All other systems reviewed and are negative.   Updated Vital Signs BP (!) 144/93   Pulse 95   Temp 97.9 F (36.6 C) (Oral)   Resp  (!) 23   Ht 4' 8 (1.422 m)   Wt 46.7 kg   SpO2 94%   BMI 23.09 kg/m   Physical Exam Vitals and nursing note reviewed.  Constitutional:      General: She is not in acute distress.    Appearance: Normal appearance. She is not ill-appearing.  HENT:     Head: Normocephalic and atraumatic.     Nose: Nose normal.     Mouth/Throat:     Mouth: Mucous membranes are moist.  Eyes:     Extraocular Movements: Extraocular movements intact.     Conjunctiva/sclera: Conjunctivae normal.     Pupils: Pupils are equal, round, and reactive to light.  Cardiovascular:     Rate and Rhythm: Normal rate and regular rhythm.     Pulses: Normal pulses.     Heart sounds: Normal heart sounds. No murmur heard.    No gallop.  Pulmonary:     Effort: Pulmonary effort is normal. No respiratory distress.     Breath sounds: Normal breath sounds. No stridor. No wheezing, rhonchi or rales.  Abdominal:     General: Abdomen is flat. Bowel sounds are normal. There is no distension.     Palpations: Abdomen is soft.     Tenderness: There is no abdominal tenderness. There is no guarding.  Musculoskeletal:        General: Normal range of motion.     Cervical back: Normal range of motion and neck supple. No rigidity or tenderness.     Right lower leg: Edema present.     Left lower leg: Edema present.  Skin:    General: Skin is warm and dry.  Neurological:     General: No focal deficit present.     Mental Status: She is alert and oriented to person, place, and time. Mental status is at baseline.     Cranial Nerves: No cranial nerve deficit.     Sensory: No sensory deficit.     Motor: No weakness.     Coordination: Coordination normal.     Gait: Gait normal.  Psychiatric:        Mood and Affect: Mood normal.        Behavior: Behavior normal.        Thought Content: Thought content normal.        Judgment: Judgment normal.     (all labs ordered are listed, but only abnormal results are displayed) Labs  Reviewed  COMPREHENSIVE METABOLIC PANEL WITH GFR - Abnormal; Notable for the following components:      Result Value   Sodium 127 (*)    Chloride 92 (*)    Glucose, Bld 123 (*)    Calcium  8.2 (*)    Total Protein 6.1 (*)  Albumin 3.3 (*)    Alkaline Phosphatase 146 (*)    Total Bilirubin 1.3 (*)    All other components within normal limits  CBC WITH DIFFERENTIAL/PLATELET - Abnormal; Notable for the following components:   WBC 10.6 (*)    MCV 100.3 (*)    RDW 18.6 (*)    Neutro Abs 8.2 (*)    Monocytes Absolute 1.2 (*)    All other components within normal limits  BRAIN NATRIURETIC PEPTIDE - Abnormal; Notable for the following components:   B Natriuretic Peptide 1,227.0 (*)    All other components within normal limits  MAGNESIUM   TROPONIN I (HIGH SENSITIVITY)    EKG: None  Radiology: DG Chest 1 View Result Date: 06/22/2024 CLINICAL DATA:  edema EXAM: CHEST  1 VIEW COMPARISON:  June 12, 2024 FINDINGS: Low lung volumes with chronic interstitial opacities throughout both lungs. Nodular opacity in the right lung base. No pleural effusion or pneumothorax. Mild cardiomegaly. Tortuous aorta with aortic atherosclerosis. No acute fracture or destructive lesions. Multilevel thoracic osteophytosis. Osteopenia. IMPRESSION: Nodular opacity in the right lung base, which may represent atelectasis or a developing bronchopneumonia, in the correct clinical context. Electronically Signed   By: Rogelia Myers M.D.   On: 06/22/2024 17:20     Procedures   Medications Ordered in the ED  furosemide  (LASIX ) injection 20 mg (20 mg Intravenous Given 06/22/24 1800)                                    Medical Decision Making Amount and/or Complexity of Data Reviewed Labs: ordered. Radiology: ordered.  Risk Prescription drug management. Decision regarding hospitalization.   This patient presents to the ED for concern of generalized weakness, low sodium differential diagnosis includes  electrolyte derangement, acute kidney injury, CHF, fluid overload, dehydration    Additional history obtained:  Additional history obtained from family External records from outside source obtained and reviewed including medical records   Lab Tests:  I Ordered, and personally interpreted labs.  The pertinent results include: Mild leukocytosis, no anemia, normal kidney function and liver function, hyponatremia noted, normal magnesium , normal troponin, elevated BNP   Imaging Studies ordered:  I ordered imaging studies including chest x-ray I independently visualized and interpreted imaging which showed no acute cardiopulmonary process I agree with the radiologist interpretation   Medicines ordered and prescription drug management:  I ordered medication including Lasix  for edema Reevaluation of the patient after these medicines showed that the patient improved I have reviewed the patients home medicines and have made adjustments as needed   Problem List / ED Course:  Patient is doing well at this time.  Discussed with patient we will plan for admission to the hospitalist service.  Suspect that she may be fluid overloaded at this point and will avoid IV fluids.  She was given a dose of Lasix .  Vital signs do remain stable.  She has no acute EKG changes at this point.  Blood work was otherwise unremarkable except for elevated BNP.  Last echo was approximately 1 year ago.  Did discuss patient case with Dr. Laurita with the hospitalist service who has excepted for admission at this time.   Social Determinants of Health:  None        Final diagnoses:  Hypervolemia, unspecified hypervolemia type  Hyponatremia    ED Discharge Orders     None  Daralene Lonni BIRCH, PA-C 06/22/24 1846    Patsey Lot, MD 06/23/24 1330

## 2024-06-22 NOTE — ED Triage Notes (Signed)
 Pt had blood work completed at facility yesterday and dr wants her admitted for low sodium according to EMS. Not diabetic, hx of HTN.

## 2024-06-23 DIAGNOSIS — I5033 Acute on chronic diastolic (congestive) heart failure: Secondary | ICD-10-CM | POA: Diagnosis not present

## 2024-06-23 LAB — BASIC METABOLIC PANEL WITH GFR
Anion gap: 10 (ref 5–15)
BUN: 19 mg/dL (ref 8–23)
CO2: 29 mmol/L (ref 22–32)
Calcium: 7.9 mg/dL — ABNORMAL LOW (ref 8.9–10.3)
Chloride: 91 mmol/L — ABNORMAL LOW (ref 98–111)
Creatinine, Ser: 0.73 mg/dL (ref 0.44–1.00)
GFR, Estimated: >60 mL/min (ref >60)
Glucose, Bld: 98 mg/dL (ref 70–99)
Potassium: 2.8 mmol/L — ABNORMAL LOW (ref 3.5–5.1)
Sodium: 130 mmol/L — ABNORMAL LOW (ref 135–145)

## 2024-06-23 LAB — MAGNESIUM: Magnesium: 1.9 mg/dL (ref 1.7–2.4)

## 2024-06-23 MED ORDER — FUROSEMIDE 10 MG/ML IJ SOLN
20.0000 mg | Freq: Every day | INTRAMUSCULAR | Status: DC
  Administered 2024-06-24: 20 mg via INTRAVENOUS
  Filled 2024-06-23: qty 2

## 2024-06-23 MED ORDER — OXYCODONE HCL 5 MG PO TABS
2.5000 mg | ORAL_TABLET | ORAL | Status: DC | PRN
  Administered 2024-06-24 – 2024-06-25 (×4): 2.5 mg via ORAL
  Filled 2024-06-23 (×4): qty 1

## 2024-06-23 MED ORDER — POTASSIUM CHLORIDE CRYS ER 20 MEQ PO TBCR
40.0000 meq | EXTENDED_RELEASE_TABLET | Freq: Two times a day (BID) | ORAL | Status: DC
  Administered 2024-06-23 – 2024-06-25 (×5): 40 meq via ORAL
  Filled 2024-06-23 (×5): qty 2

## 2024-06-23 MED ORDER — LIDOCAINE 5 % EX PTCH
1.0000 | MEDICATED_PATCH | CUTANEOUS | Status: DC
  Administered 2024-06-23 – 2024-06-25 (×3): 1 via TRANSDERMAL
  Filled 2024-06-23 (×3): qty 1

## 2024-06-23 NOTE — Plan of Care (Signed)
  Problem: Education: Goal: Knowledge of General Education information will improve Description: Including pain rating scale, medication(s)/side effects and non-pharmacologic comfort measures Outcome: Progressing   Problem: Health Behavior/Discharge Planning: Goal: Ability to manage health-related needs will improve Outcome: Not Progressing   Problem: Clinical Measurements: Goal: Ability to maintain clinical measurements within normal limits will improve Outcome: Progressing Goal: Will remain free from infection Outcome: Progressing Goal: Diagnostic test results will improve Outcome: Progressing Goal: Respiratory complications will improve Outcome: Progressing Goal: Cardiovascular complication will be avoided Outcome: Progressing   Problem: Activity: Goal: Risk for activity intolerance will decrease Outcome: Not Progressing   Problem: Nutrition: Goal: Adequate nutrition will be maintained Outcome: Progressing   Problem: Coping: Goal: Level of anxiety will decrease Outcome: Progressing   Problem: Elimination: Goal: Will not experience complications related to bowel motility Outcome: Progressing Goal: Will not experience complications related to urinary retention Outcome: Progressing   Problem: Pain Managment: Goal: General experience of comfort will improve and/or be controlled Outcome: Progressing   Problem: Safety: Goal: Ability to remain free from injury will improve Outcome: Progressing   Problem: Skin Integrity: Goal: Risk for impaired skin integrity will decrease Outcome: Progressing

## 2024-06-23 NOTE — Evaluation (Signed)
 Physical Therapy Evaluation Patient Details Name: Norma Barajas MRN: [id number] DOB: 30-Aug-1941 Today's Date: 06/23/2024  History of Present Illness  Norma Barajas is a 83 y.o. female with medical history significant of chronic HFpEF, pulmonary hypertension with cor pulmonale, PAF not on anticoagulation, CAD, HTN sent from nursing home for evaluation of worsening of leg swelling and worsening of hyponatremia. Patient has hard hearing, daughter at bedside gave history.  Patient has a history of CHF taking torsemide  twice daily, starting 3 weeks ago patient started develop worsening of peripheral edema and nursing home decided to increase torsemide  to 3 times a day for the last 2 weeks.  However patient continued to gain weight and worsening of peripheral edema and today blood work showed worsening of hyponatremia and patient sent to ED.  Patient denied any cough shortness of breath chest pains fever chills.   Clinical Impression  Patient demonstrates slow labored movement for sitting up at bedside, very unsteady on feet and limited to couple of shuffling side steps before having to sit due to loss of balance using RW. Patient tolerated sitting up in chair after therapy. Patient will benefit from continued skilled physical therapy in hospital and recommended venue below to increase strength, balance, endurance for safe ADLs and gait.           If plan is discharge home, recommend the following: A lot of help with walking and/or transfers;A lot of help with bathing/dressing/bathroom;Help with stairs or ramp for entrance;Assist for transportation   Can travel by private vehicle        Equipment Recommendations None recommended by PT  Recommendations for Other Services       Functional Status Assessment Patient has had a recent decline in their functional status and demonstrates the ability to make significant improvements in function in a reasonable and predictable amount of time.     Precautions  / Restrictions Precautions Precautions: Fall Recall of Precautions/Restrictions: Intact Restrictions Weight Bearing Restrictions Per Provider Order: No      Mobility  Bed Mobility Overal bed mobility: Needs Assistance Bed Mobility: Supine to Sit     Supine to sit: Mod assist     General bed mobility comments: increased time, labored movement, had most diffiuclty scooting to EOB    Transfers Overall transfer level: Needs assistance Equipment used: Rolling walker (2 wheels) Transfers: Sit to/from Stand, Bed to chair/wheelchair/BSC Sit to Stand: Mod assist   Step pivot transfers: Mod assist, Max assist       General transfer comment: unsteady labored movement with buckling of knees    Ambulation/Gait Ambulation/Gait assistance: Max assist Gait Distance (Feet): 3 Feet Assistive device: Rolling walker (2 wheels) Gait Pattern/deviations: Decreased step length - right, Decreased step length - left, Decreased stride length, Shuffle Gait velocity: slow     General Gait Details: limited to a few unsteady labored shuffling side steps before having to sit due to loss of balance  Stairs            Wheelchair Mobility     Tilt Bed    Modified Rankin (Stroke Patients Only)       Balance Overall balance assessment: Needs assistance Sitting-balance support: Feet supported, No upper extremity supported Sitting balance-Leahy Scale: Fair Sitting balance - Comments: fair/good seated at EOB   Standing balance support: Reliant on assistive device for balance, During functional activity, Bilateral upper extremity supported Standing balance-Leahy Scale: Poor Standing balance comment: using RW  Pertinent Vitals/Pain Pain Assessment Pain Assessment: Faces Faces Pain Scale: Hurts a little bit Pain Location: back Pain Descriptors / Indicators: Discomfort Pain Intervention(s): Limited activity within patient's tolerance, Monitored  during session, Repositioned    Home Living Family/patient expects to be discharged to:: Assisted living                 Home Equipment: Tub bench;Grab bars - tub/shower;Grab bars - toilet;Rollator (4 wheels);Rolling Walker (2 wheels);Cane - single point;BSC/3in1;Wheelchair - manual      Prior Function Prior Level of Function : Needs assist       Physical Assist : Mobility (physical);ADLs (physical) Mobility (physical): Bed mobility;Transfers;Gait;Stairs   Mobility Comments: Assisted for transfers, using wheelchair for mobility, non-ambulatory ADLs Comments: Reports she receives assistance with most ADLs. Able to feed herself     Extremity/Trunk Assessment   Upper Extremity Assessment Upper Extremity Assessment: Generalized weakness    Lower Extremity Assessment Lower Extremity Assessment: Generalized weakness    Cervical / Trunk Assessment Cervical / Trunk Assessment: Kyphotic  Communication   Communication Communication: No apparent difficulties    Cognition Arousal: Alert Behavior During Therapy: WFL for tasks assessed/performed   PT - Cognitive impairments: No apparent impairments                         Following commands: Intact       Cueing Cueing Techniques: Verbal cues, Tactile cues     General Comments      Exercises     Assessment/Plan    PT Assessment Patient needs continued PT services  PT Problem List Decreased strength;Decreased activity tolerance;Decreased balance;Decreased mobility       PT Treatment Interventions DME instruction;Functional mobility training;Therapeutic activities;Therapeutic exercise;Balance training;Patient/family education;Gait training    PT Goals (Current goals can be found in the Care Plan section)  Acute Rehab PT Goals Patient Stated Goal: return home with ALF staff to assist PT Goal Formulation: With patient Time For Goal Achievement: 06/26/24 Potential to Achieve Goals: Good    Frequency  Min 3X/week     Co-evaluation               AM-PAC PT 6 Clicks Mobility  Outcome Measure Help needed turning from your back to your side while in a flat bed without using bedrails?: A Lot Help needed moving from lying on your back to sitting on the side of a flat bed without using bedrails?: A Lot Help needed moving to and from a bed to a chair (including a wheelchair)?: A Lot Help needed standing up from a chair using your arms (e.g., wheelchair or bedside chair)?: A Lot Help needed to walk in hospital room?: A Lot Help needed climbing 3-5 steps with a railing? : Total 6 Click Score: 11    End of Session   Activity Tolerance: Patient tolerated treatment well;Patient limited by fatigue Patient left: in chair;with call bell/phone within reach Nurse Communication: Mobility status PT Visit Diagnosis: Unsteadiness on feet (R26.81);Other abnormalities of gait and mobility (R26.89);Muscle weakness (generalized) (M62.81)    Time: 8860-8843 PT Time Calculation (min) (ACUTE ONLY): 17 min   Charges:   PT Evaluation $PT Eval Low Complexity: 1 Low PT Treatments $Therapeutic Activity: 8-22 mins PT General Charges $$ ACUTE PT VISIT: 1 Visit         1:58 PM, 06/23/24 Lynwood Music, MPT Physical Therapist with [facility] [phone] office [phone] mobile phone

## 2024-06-23 NOTE — Progress Notes (Addendum)
 PROGRESS NOTE   Norma Barajas  FMW:[id number] DOB: 03-Jan-1941 DOA: 06/22/2024 PCP: Severa Rock HERO, FNP   Chief Complaint  Patient presents with   abnormal labs   Level of care: Telemetry  Brief Admission History:  83 y.o. female with medical history significant of chronic HFpEF, pulmonary hypertension with cor pulmonale, PAF not on anticoagulation, CAD, HTN sent from nursing home for evaluation of worsening of leg swelling and worsening of hyponatremia.   Patient has hard hearing, daughter at bedside gave history.  Patient has a history of CHF taking torsemide  twice daily, starting 3 weeks ago patient started develop worsening of peripheral edema and nursing home decided to increase torsemide  to 3 times a day for the last 2 weeks.  However patient continued to gain weight and worsening of peripheral edema and today blood work showed worsening of hyponatremia and patient sent to ED.  Patient denied any cough shortness of breath chest pains fever chills.   ED Course: Afebrile, blood pressure 130/80 O2 saturation 94% on room air.  Blood work showed sodium 127, BUN 22 creatinine 0.8 glucose 123 WBC 10.6 hemoglobin 13.4. Chest x-ray negative for acute findings, patient was given IV Lasix  20 mg in the ED.   Assessment and Plan:  Acute on chronic HFpEF  Acute on chronic right-sided CHF/cor pulmonale - Failed outpatient management - continue diuresis IV Lasix  40 mg daily, s/p 1 dose of metolazone  - Change BP/CHF medications, hold off Cardizem  due to increasing peripheral edema, increase metoprolol  from 100 to 150 mg daily   Hyponatremia, acute on chronic - Hypervolemic, fluid overload - Worsening of sodium level secondary to fluid retention from CHF, sedation, management as above  Hypokalemia - replacement ordered - recheck in AM    HTN - Held Cardizem  due to worsening of peripheral edema - continue metoprolol  and follow - hydralazine  plus Imdur  regimen started on admission   PAF -  Rate control as above - Not on anticoagulation due to history of unsteady gait and frequent falls.  According to cardiologist note, not a candidate for Watchman procedure due to overall condition and frailty   Deconditioning - PT evaluation   DVT prophylaxis: sq heparin  Code Status: DNR  Family Communication: bedside update  Disposition: home    Consultants:   Procedures:   Antimicrobials:    Subjective: Pt says that she has been urinating most of the night and she is having a flare of her chronic back pain.   Objective: Vitals:   06/23/24 0300 06/23/24 0805 06/23/24 1005 06/23/24 1402  BP: (!) 151/80 (!) 139/99 127/79 136/88  Pulse: (!) 105 91 88 94  Resp:  18    Temp: 97.6 F (36.4 C) 97.8 F (36.6 C)  98 F (36.7 C)  TempSrc: Axillary Oral  Oral  SpO2: 90% 94%  97%  Weight: 53.7 kg     Height:        Intake/Output Summary (Last 24 hours) at 06/23/2024 1443 Last data filed at 06/23/2024 1310 Gross per 24 hour  Intake 250 ml  Output 2850 ml  Net -2600 ml   Filed Weights   06/22/24 1555 06/22/24 2100 06/23/24 0300  Weight: 46.7 kg 50.2 kg 53.7 kg   Examination:  General exam: Appears calm and comfortable  Respiratory system: Clear to auscultation. Respiratory effort normal. Cardiovascular system: normal S1 & S2 heard. No JVD, murmurs, rubs, gallops or clicks. No pedal edema. Gastrointestinal system: Abdomen is nondistended, soft and nontender. No organomegaly or masses felt. Normal  bowel sounds heard. Central nervous system: Alert and oriented. No focal neurological deficits. Extremities: Symmetric 5 x 5 power. Skin: No rashes, lesions or ulcers. Psychiatry: Judgement and insight appear normal. Mood & affect appropriate.   Data Reviewed: I have personally reviewed following labs and imaging studies  CBC: Recent Labs  Lab 06/21/24 1120 06/22/24 1601  WBC 9.8 10.6*  NEUTROABS 7.6* 8.2*  HGB 13.5 13.4  HCT 41.2 40.0  MCV 104* 100.3*  PLT 194 181     Basic Metabolic Panel: Recent Labs  Lab 06/21/24 1120 06/22/24 1601 06/23/24 0505  NA 123* 127* 130*  K 5.6* 4.5 2.8*  CL 90* 92* 91*  CO2 19* 23 29  GLUCOSE 125* 123* 98  BUN 18 22 19   CREATININE 0.74 0.84 0.73  CALCIUM  8.7 8.2* 7.9*  MG  --  2.0 1.9    CBG: No results for input(s): GLUCAP in the last 168 hours.  No results found for this or any previous visit (from the past 240 hours).   Radiology Studies: DG Chest 1 View Result Date: 06/22/2024 CLINICAL DATA:  edema EXAM: CHEST  1 VIEW COMPARISON:  June 12, 2024 FINDINGS: Low lung volumes with chronic interstitial opacities throughout both lungs. Nodular opacity in the right lung base. No pleural effusion or pneumothorax. Mild cardiomegaly. Tortuous aorta with aortic atherosclerosis. No acute fracture or destructive lesions. Multilevel thoracic osteophytosis. Osteopenia. IMPRESSION: Nodular opacity in the right lung base, which may represent atelectasis or a developing bronchopneumonia, in the correct clinical context. Electronically Signed   By: Rogelia Myers M.D.   On: 06/22/2024 17:20   Scheduled Meds:  aspirin  EC  81 mg Oral Q breakfast   [START ON 06/24/2024] furosemide   20 mg Intravenous Daily   heparin   5,000 Units Subcutaneous Q12H   hydrALAZINE   10 mg Oral Q8H   isosorbide  mononitrate  30 mg Oral Daily   lidocaine   1 patch Transdermal Q24H   metoprolol  succinate  150 mg Oral BID   midodrine   2.5 mg Oral BID WC   pantoprazole   40 mg Oral Daily   potassium chloride   40 mEq Oral BID   sertraline   25 mg Oral Daily   simvastatin   20 mg Oral QHS   sodium chloride  flush  3 mL Intravenous Q12H   Continuous Infusions:  sodium chloride        LOS: 1 day   Time spent: 55 mins  Treyshon Buchanon Vicci, MD How to contact the [facility] Attending or Consulting provider 7A - 7P or covering provider during after hours 7P -7A, for this patient?  Check the care team in [facility] and look for a) attending/consulting [facility] provider  listed and b) the [facility] team listed Log into [email or web address] to find provider on call.  Locate the TR[facility] provider you are looking for under [facility] and page to a number that you can be directly reached. If you still have difficulty reaching the provider, please page the [facility] (Director on Call) for the Hospitalists listed on amion for assistance.  06/23/2024, 2:43 PM

## 2024-06-23 NOTE — Plan of Care (Signed)
  Problem: Acute Rehab PT Goals(only PT should resolve) Goal: Pt Will Go Supine/Side To Sit Outcome: Progressing Flowsheets (Taken 06/23/2024 1400) Pt will go Supine/Side to Sit: with minimal assist Goal: Patient Will Transfer Sit To/From Stand Outcome: Progressing Flowsheets (Taken 06/23/2024 1400) Patient will transfer sit to/from stand:  with minimal assist  with moderate assist Goal: Pt Will Transfer Bed To Chair/Chair To Bed Outcome: Progressing Flowsheets (Taken 06/23/2024 1400) Pt will Transfer Bed to Chair/Chair to Bed:  with min assist  with mod assist Goal: Pt Will Ambulate Outcome: Progressing Flowsheets (Taken 06/23/2024 1400) Pt will Ambulate:  10 feet  with moderate assist  with rolling walker   2:00 PM, 06/23/24 Lynwood Music, MPT Physical Therapist with [facility] 336 [phone] office [phone] mobile phone

## 2024-06-23 NOTE — Hospital Course (Signed)
 83 y.o. female with medical history significant of chronic HFpEF, pulmonary hypertension with cor pulmonale, PAF not on anticoagulation, CAD, HTN sent from nursing home for evaluation of worsening of leg swelling and worsening of hyponatremia.   Patient has hard hearing, daughter at bedside gave history.  Patient has a history of CHF taking torsemide  twice daily, starting 3 weeks ago patient started develop worsening of peripheral edema and nursing home decided to increase torsemide  to 3 times a day for the last 2 weeks.  However patient continued to gain weight and worsening of peripheral edema and today blood work showed worsening of hyponatremia and patient sent to ED.  Patient denied any cough shortness of breath chest pains fever chills.   ED Course: Afebrile, blood pressure 130/80 O2 saturation 94% on room air.  Blood work showed sodium 127, BUN 22 creatinine 0.8 glucose 123 WBC 10.6 hemoglobin 13.4. Chest x-ray negative for acute findings, patient was given IV Lasix  20 mg in the ED.

## 2024-06-24 DIAGNOSIS — I5033 Acute on chronic diastolic (congestive) heart failure: Secondary | ICD-10-CM | POA: Diagnosis not present

## 2024-06-24 LAB — BASIC METABOLIC PANEL WITH GFR
Anion gap: 11 (ref 5–15)
BUN: 13 mg/dL (ref 8–23)
CO2: 28 mmol/L (ref 22–32)
Calcium: 8.1 mg/dL — ABNORMAL LOW (ref 8.9–10.3)
Chloride: 90 mmol/L — ABNORMAL LOW (ref 98–111)
Creatinine, Ser: 0.65 mg/dL (ref 0.44–1.00)
GFR, Estimated: >60 mL/min (ref >60)
Glucose, Bld: 84 mg/dL (ref 70–99)
Potassium: 3.5 mmol/L (ref 3.5–5.1)
Sodium: 129 mmol/L — ABNORMAL LOW (ref 135–145)

## 2024-06-24 MED ORDER — FUROSEMIDE 10 MG/ML IJ SOLN
20.0000 mg | Freq: Two times a day (BID) | INTRAMUSCULAR | Status: DC
  Administered 2024-06-24 – 2024-06-25 (×2): 20 mg via INTRAVENOUS
  Filled 2024-06-24 (×2): qty 2

## 2024-06-24 MED ORDER — SENNOSIDES-DOCUSATE SODIUM 8.6-50 MG PO TABS
1.0000 | ORAL_TABLET | Freq: Every day | ORAL | Status: DC
  Filled 2024-06-24: qty 1

## 2024-06-24 MED ORDER — MILK AND MOLASSES ENEMA
1.0000 | Freq: Once | RECTAL | Status: AC
  Administered 2024-06-24: 150 mL via RECTAL

## 2024-06-24 MED ORDER — BOOST / RESOURCE BREEZE PO LIQD CUSTOM
1.0000 | Freq: Three times a day (TID) | ORAL | Status: DC
  Administered 2024-06-24 – 2024-06-25 (×2): 1 via ORAL

## 2024-06-24 NOTE — Plan of Care (Signed)

## 2024-06-24 NOTE — Progress Notes (Signed)
 Attempted to do REDs clip x2. Received low quality message x2. Pt declined the third attempt.

## 2024-06-24 NOTE — Progress Notes (Signed)
 PROGRESS NOTE   Norma Barajas  FMW:[id number] DOB: June 06, 1941 DOA: 06/22/2024 PCP: Severa Rock HERO, FNP   Chief Complaint  Patient presents with   abnormal labs   Level of care: Telemetry  Brief Admission History:  83 y.o. female with medical history significant of chronic HFpEF, pulmonary hypertension with cor pulmonale, PAF not on anticoagulation, CAD, HTN sent from nursing home for evaluation of worsening of leg swelling and worsening of hyponatremia.   Patient has hard hearing, daughter at bedside gave history.  Patient has a history of CHF taking torsemide  twice daily, starting 3 weeks ago patient started develop worsening of peripheral edema and nursing home decided to increase torsemide  to 3 times a day for the last 2 weeks.  However patient continued to gain weight and worsening of peripheral edema and today blood work showed worsening of hyponatremia and patient sent to ED.  Patient denied any cough shortness of breath chest pains fever chills.   ED Course: Afebrile, blood pressure 130/80 O2 saturation 94% on room air.  Blood work showed sodium 127, BUN 22 creatinine 0.8 glucose 123 WBC 10.6 hemoglobin 13.4. Chest x-ray negative for acute findings, patient was given IV Lasix  20 mg in the ED.   Assessment and Plan:  Acute on chronic HFpEF  Acute on chronic right-sided CHF/cor pulmonale - Failed outpatient management - continue diuresis IV Lasix  40 mg daily, s/p 1 dose of metolazone  - Change BP/CHF medications, hold off Cardizem  due to increasing peripheral edema, increase metoprolol  from 100 to 150 mg daily   Hyponatremia, acute on chronic - Hypervolemic, fluid overload - Worsening of sodium level secondary to fluid retention from CHF, sedation, management as above  Hypokalemia - replacement ordered - repleted     HTN - Held Cardizem  due to worsening of peripheral edema - continue metoprolol  succinate 150 mg BID and follow - hydralazine  plus Imdur  regimen started on  admission for better BP control   PAF - Rate control as above - Not on anticoagulation due to history of unsteady gait and frequent falls.  According to cardiologist note, not a candidate for Watchman procedure due to overall condition and frailty   Deconditioning - PT evaluation   DVT prophylaxis: sq heparin  Code Status: DNR  Family Communication: bedside update  Disposition: home    Consultants:   Procedures:   Antimicrobials:    Subjective: Pt having back pain but her edema is markedly improved on IV lasix  treatment.    Objective: Vitals:   06/23/24 2132 06/24/24 0500 06/24/24 0537 06/24/24 1423  BP: (!) 164/91  (!) 150/82 109/78  Pulse: 99  95 97  Resp: 18  18   Temp: 98.6 F (37 C)  98.5 F (36.9 C) 98.3 F (36.8 C)  TempSrc:   Oral Oral  SpO2: 94%  92% 95%  Weight:  51.8 kg    Height:        Intake/Output Summary (Last 24 hours) at 06/24/2024 1447 Last data filed at 06/24/2024 1258 Gross per 24 hour  Intake 880 ml  Output 900 ml  Net -20 ml   Filed Weights   06/22/24 2100 06/23/24 0300 06/24/24 0500  Weight: 50.2 kg 53.7 kg 51.8 kg   Examination:  General exam: Appears calm and comfortable  Respiratory system: Clear to auscultation. Respiratory effort normal. Cardiovascular system: normal S1 & S2 heard. No JVD, murmurs, rubs, gallops or clicks. No pedal edema. Gastrointestinal system: Abdomen is nondistended, soft and nontender. No organomegaly or masses felt. Normal bowel  sounds heard. Central nervous system: Alert and oriented. No focal neurological deficits. Extremities: Symmetric 5 x 5 power. Skin: No rashes, lesions or ulcers. Psychiatry: Judgement and insight appear normal. Mood & affect appropriate.   Data Reviewed: I have personally reviewed following labs and imaging studies  CBC: Recent Labs  Lab 06/21/24 1120 06/22/24 1601  WBC 9.8 10.6*  NEUTROABS 7.6* 8.2*  HGB 13.5 13.4  HCT 41.2 40.0  MCV 104* 100.3*  PLT 194 181    Basic  Metabolic Panel: Recent Labs  Lab 06/21/24 1120 06/22/24 1601 06/23/24 0505 06/24/24 0518  NA 123* 127* 130* 129*  K 5.6* 4.5 2.8* 3.5  CL 90* 92* 91* 90*  CO2 19* 23 29 28   GLUCOSE 125* 123* 98 84  BUN 18 22 19 13   CREATININE 0.74 0.84 0.73 0.65  CALCIUM  8.7 8.2* 7.9* 8.1*  MG  --  2.0 1.9  --     CBG: No results for input(s): GLUCAP in the last 168 hours.  No results found for this or any previous visit (from the past 240 hours).   Radiology Studies: DG Chest 1 View Result Date: 06/22/2024 CLINICAL DATA:  edema EXAM: CHEST  1 VIEW COMPARISON:  June 12, 2024 FINDINGS: Low lung volumes with chronic interstitial opacities throughout both lungs. Nodular opacity in the right lung base. No pleural effusion or pneumothorax. Mild cardiomegaly. Tortuous aorta with aortic atherosclerosis. No acute fracture or destructive lesions. Multilevel thoracic osteophytosis. Osteopenia. IMPRESSION: Nodular opacity in the right lung base, which may represent atelectasis or a developing bronchopneumonia, in the correct clinical context. Electronically Signed   By: Rogelia Myers M.D.   On: 06/22/2024 17:20   Scheduled Meds:  aspirin  EC  81 mg Oral Q breakfast   furosemide   20 mg Intravenous BID   heparin   5,000 Units Subcutaneous Q12H   hydrALAZINE   10 mg Oral Q8H   isosorbide  mononitrate  30 mg Oral Daily   lidocaine   1 patch Transdermal Q24H   metoprolol  succinate  150 mg Oral BID   midodrine   2.5 mg Oral BID WC   pantoprazole   40 mg Oral Daily   potassium chloride   40 mEq Oral BID   sertraline   25 mg Oral Daily   simvastatin   20 mg Oral QHS   sodium chloride  flush  3 mL Intravenous Q12H   Continuous Infusions:   LOS: 2 days   Time spent: 55 mins  Stepheni Cameron Vicci, MD How to contact the [facility] Attending or Consulting provider 7A - 7P or covering provider during after hours 7P -7A, for this patient?  Check the care team in [facility] and look for a) attending/consulting TRH provider listed  and b) the TR[facility] team listed Log into [email or web address] to find provider on call.  Locate the [facility] provider you are looking for under [facility] and page to a number that you can be directly reached. If you still have difficulty reaching the provider, please page the [facility] (Director on Call) for the Hospitalists listed on amion for assistance.  06/24/2024, 2:47 PM

## 2024-06-25 DIAGNOSIS — Z9181 History of falling: Secondary | ICD-10-CM | POA: Diagnosis not present

## 2024-06-25 DIAGNOSIS — I10 Essential (primary) hypertension: Secondary | ICD-10-CM | POA: Diagnosis not present

## 2024-06-25 DIAGNOSIS — M6281 Muscle weakness (generalized): Secondary | ICD-10-CM | POA: Diagnosis not present

## 2024-06-25 DIAGNOSIS — I5033 Acute on chronic diastolic (congestive) heart failure: Secondary | ICD-10-CM | POA: Diagnosis not present

## 2024-06-25 DIAGNOSIS — I4891 Unspecified atrial fibrillation: Secondary | ICD-10-CM | POA: Diagnosis not present

## 2024-06-25 DIAGNOSIS — R279 Unspecified lack of coordination: Secondary | ICD-10-CM | POA: Diagnosis not present

## 2024-06-25 MED ORDER — POLYETHYLENE GLYCOL 3350 17 G PO PACK: 17.0000 g | PACK | Freq: Every day | ORAL | Status: DC

## 2024-06-25 MED ORDER — OXYCODONE HCL 5 MG PO TABS: 2.5000 mg | ORAL_TABLET | ORAL | 0 refills | Status: DC | PRN

## 2024-06-25 MED ORDER — ACETAMINOPHEN 325 MG PO TABS: 650.0000 mg | ORAL_TABLET | Freq: Three times a day (TID) | ORAL | Status: DC

## 2024-06-25 MED ORDER — LIDOCAINE 5 % EX PTCH: 2.0000 | MEDICATED_PATCH | Freq: Every day | CUTANEOUS | Status: DC | PRN

## 2024-06-25 MED ORDER — ISOSORBIDE MONONITRATE ER 30 MG PO TB24: 30.0000 mg | ORAL_TABLET | Freq: Every day | ORAL | Status: DC

## 2024-06-25 MED ORDER — METOPROLOL SUCCINATE ER 50 MG PO TB24: 150.0000 mg | ORAL_TABLET | Freq: Two times a day (BID) | ORAL | Status: DC

## 2024-06-25 MED ORDER — SIMVASTATIN 20 MG PO TABS: 20.0000 mg | ORAL_TABLET | Freq: Every day | ORAL | Status: DC

## 2024-06-25 MED ORDER — HYDRALAZINE HCL 10 MG PO TABS: 10.0000 mg | ORAL_TABLET | Freq: Three times a day (TID) | ORAL | Status: DC

## 2024-06-25 NOTE — Progress Notes (Signed)
 Patient discharged back to ALF via EMS in stable condition.

## 2024-06-25 NOTE — [place] FL2 (Signed)
 [place]  MEDICAID FL2 LEVEL OF CARE FORM     IDENTIFICATION  Patient Name: Norma Barajas Birthdate: 04/16/1941 Sex: female Admission Date (Current Location): 06/22/2024  [facility] and [place] Number:  [employer] and Address:  [facility],  [address] [place] [address]      Provider Number: [id number]  Attending Physician Name and Address:  Vicci Afton CROME, MD  Relative Name and Phone Number:       Current Level of Care: Hospital Recommended Level of Care: Assisted Living Facility Prior Approval Number:    Date Approved/Denied:   PASRR Number:    Discharge Plan: Other (Comment) (ALF)    Current Diagnoses: Patient Active Problem List   Diagnosis Date Noted   CHF (congestive heart failure) (HCC) 06/22/2024   AKI (acute kidney injury) (HCC) 06/12/2024   Acute metabolic encephalopathy 06/12/2024   Falls 06/12/2024   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   Stage 2 chronic kidney disease 03/01/2024   Vitamin B12 deficiency 09/01/2023   Pelvic fracture (HCC) 08/05/2023   Vertigo 07/01/2023   Afib (HCC) 04/27/2023   Iron  deficiency 11/03/2022   Nonspecific abnormal electrocardiogram (ECG) (EKG) 08/29/2022   Abnormal SPEP 02/07/2022   Degenerative disc disease, lumbar 12/20/2021   Hypertensive heart disease with chronic diastolic congestive heart failure (HCC) 03/27/2021   Gallbladder sludge 03/27/2021   Aortic atherosclerosis (HCC) 03/27/2021   Acute on chronic diastolic CHF (congestive heart failure) (HCC) 03/15/2021   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) 10/23/2020   Severe tricuspid valve regurgitation 10/23/2020   Hypokalemia 09/16/2020   Early satiety 07/15/2020   Difficulty sleeping 06/22/2019   Paroxysmal atrial fibrillation (HCC) 08/21/2015   Lumbar pain 08/07/2015   Vitamin D  deficiency 11/27/2014   Drug induced constipation 12/14/2013   HTN (hypertension) 07/24/2013   Coronary  atherosclerosis of native coronary artery 07/24/2013   Dysphagia 09/28/2012   Pulmonary nodule 02/18/2011   Hyperlipidemia 07/29/2008   GAD (generalized anxiety disorder) 07/29/2008   Primary hypertension 07/29/2008   GERD 07/29/2008   Osteoporosis 07/29/2008    Orientation RESPIRATION BLADDER Height & Weight     Self, Situation, Place  Normal Incontinent Weight: 108 lb 0.4 oz (49 kg) (bed weight for safety of patient based on PT note and nurse judgement.) Height:  4' 8 (142.2 cm)  BEHAVIORAL SYMPTOMS/MOOD NEUROLOGICAL BOWEL NUTRITION STATUS      Incontinent Diet (Regular)  AMBULATORY STATUS COMMUNICATION OF NEEDS Skin   Extensive Assist Verbally Normal                       Personal Care Assistance Level of Assistance  Bathing, Feeding, Dressing Bathing Assistance: Maximum assistance Feeding assistance: Limited assistance Dressing Assistance: Maximum assistance     Functional Limitations Info  Sight, Hearing, Speech Sight Info: Impaired Hearing Info: Impaired Speech Info: Adequate    SPECIAL CARE FACTORS FREQUENCY  PT (By licensed PT)     PT Frequency: 2-3 times weekly              Contractures Contractures Info: Not present    Additional Factors Info  Code Status, Allergies, Psychotropic Code Status Info: DNR Allergies Info: Codeine Triamterene-hctz Atorvastatin Crestor (Rosuvastatin Calcium ) Doxycycline  Morphine  Risedronate Sodium Psychotropic Info: Trazodone          Current Medications (06/25/2024):  This is the current hospital active medication list Current Facility-Administered Medications  Medication Dose Route Frequency Provider Last Rate Last Admin   acetaminophen  (TYLENOL ) tablet 650  mg  650 mg Oral Q6H PRN Laurita Manor T, MD   650 mg at 06/23/24 1016   alum & mag hydroxide-simeth (MAALOX/MYLANTA) 200-200-20 MG/5ML suspension 30 mL  30 mL Oral PRN Laurita Manor DASEN, MD       aspirin  EC tablet 81 mg  81 mg Oral Q breakfast Laurita Manor T, MD   81  mg at 06/25/24 0844   feeding supplement (BOOST / RESOURCE BREEZE) liquid 1 Container  1 Container Oral TID WC Johnson, Clanford L, MD   1 Container at 06/25/24 0845   furosemide  (LASIX ) injection 20 mg  20 mg Intravenous BID Johnson, Clanford L, MD   20 mg at 06/25/24 0844   heparin  injection 5,000 Units  5,000 Units Subcutaneous Q12H Laurita Manor T, MD   5,000 Units at 06/25/24 1006   hydrALAZINE  (APRESOLINE ) tablet 10 mg  10 mg Oral Q8H Laurita Manor T, MD   10 mg at 06/25/24 9479   isosorbide  mononitrate (IMDUR ) 24 hr tablet 30 mg  30 mg Oral Daily Laurita Manor T, MD   30 mg at 06/25/24 1006   lidocaine  (LIDODERM ) 5 % 1 patch  1 patch Transdermal Q24H Johnson, Clanford L, MD   1 patch at 06/25/24 1007   meclizine  (ANTIVERT ) tablet 25 mg  25 mg Oral TID PRN Laurita Manor DASEN, MD       metoprolol  succinate (TOPROL -XL) 24 hr tablet 150 mg  150 mg Oral BID Zhang, Ping T, MD   150 mg at 06/25/24 0844   metoprolol  tartrate (LOPRESSOR ) injection 2.5 mg  2.5 mg Intravenous Q4H PRN Laurita Manor T, MD       midodrine  (PROAMATINE ) tablet 2.5 mg  2.5 mg Oral BID WC Laurita Manor T, MD   2.5 mg at 06/25/24 0843   ondansetron  (ZOFRAN ) injection 4 mg  4 mg Intravenous Q6H PRN Laurita Manor T, MD   4 mg at 06/24/24 9060   ondansetron  (ZOFRAN ) tablet 4 mg  4 mg Oral Q8H PRN Laurita Manor DASEN, MD       oxyCODONE  (Oxy IR/ROXICODONE ) immediate release tablet 2.5 mg  2.5 mg Oral Q4H PRN Vicci, Clanford L, MD   2.5 mg at 06/25/24 0416   pantoprazole  (PROTONIX ) EC tablet 40 mg  40 mg Oral Daily Laurita Manor T, MD   40 mg at 06/25/24 1006   polyethylene glycol (MIRALAX  / GLYCOLAX ) packet 17 g  17 g Oral BID PRN Laurita Manor DASEN, MD       potassium chloride  SA (KLOR-CON  M) CR tablet 40 mEq  40 mEq Oral BID Johnson, Clanford L, MD   40 mEq at 06/25/24 1006   senna-docusate (Senokot-S) tablet 1 tablet  1 tablet Oral QHS Johnson, Clanford L, MD       sertraline  (ZOLOFT ) tablet 25 mg  25 mg Oral Daily Zhang, Ping T, MD   25 mg at 06/25/24  1006   simvastatin  (ZOCOR ) tablet 20 mg  20 mg Oral QHS Laurita Manor T, MD   20 mg at 06/24/24 2232   sodium chloride  flush (NS) 0.9 % injection 3 mL  3 mL Intravenous Q12H Laurita Manor T, MD   3 mL at 06/24/24 2235   sodium chloride  flush (NS) 0.9 % injection 3 mL  3 mL Intravenous PRN Laurita Manor T, MD       traZODone  (DESYREL ) tablet 50 mg  50 mg Oral QHS PRN Laurita Manor DASEN, MD   50 mg at 06/22/24 2110  Discharge Medications: Allergies as of 06/25/2024       Reactions   Codeine Nausea And Vomiting   Triamterene-hctz Other (See Comments)   Weakness    Atorvastatin Other (See Comments)   Myalgias   Crestor [rosuvastatin Calcium ] Other (See Comments)   Weakness    Doxycycline  Other (See Comments)   Chest congestion   Morphine  Nausea Only   Risedronate Sodium Other (See Comments)   ACTONEL - reflux        Medication List     STOP taking these medications    diltiazem  240 MG 24 hr capsule Commonly known as: CARDIZEM  CD   naproxen  375 MG tablet Commonly known as: NAPROSYN        TAKE these medications    acetaminophen  325 MG tablet Commonly known as: TYLENOL  Take 2 tablets (650 mg total) by mouth 3 (three) times daily. What changed:  when to take this reasons to take this   aluminum-magnesium  hydroxide 200-200 MG/5ML suspension Take 30 mLs by mouth as needed for indigestion (heartburn). Up to 4 times a day in 24 hours.   aspirin  EC 81 MG tablet Commonly known as: Aspirin  Low Dose Take 1 tablet (81 mg total) by mouth daily with breakfast. Swallow whole.   cholecalciferol  25 MCG (1000 UNIT) tablet Commonly known as: VITAMIN D3 Take 1,000 Units by mouth in the morning.   feeding supplement Liqd Take 237 mLs by mouth 2 (two) times daily between meals. What changed: when to take this   hydrALAZINE  10 MG tablet Commonly known as: APRESOLINE  Take 1 tablet (10 mg total) by mouth 3 (three) times daily.   isosorbide  mononitrate 30 MG 24 hr tablet Commonly known  as: IMDUR  Take 1 tablet (30 mg total) by mouth daily. Start taking on: June 26, 2024   lidocaine  5 % Commonly known as: LIDODERM  Place 2 patches onto the skin daily as needed (back pain). Remove & Discard patch within 12 hours or as directed by MD   meclizine  25 MG tablet Commonly known as: ANTIVERT  Take 1 tablet (25 mg total) by mouth 3 (three) times daily as needed for dizziness.   metoprolol  succinate 50 MG 24 hr tablet Commonly known as: TOPROL -XL Take 3 tablets (150 mg total) by mouth 2 (two) times daily. Take with or immediately following a meal. What changed:  medication strength how much to take   midodrine  2.5 MG tablet Commonly known as: PROAMATINE  Take 1 tablet (2.5 mg total) by mouth 2 (two) times daily with a meal.   omeprazole  20 MG capsule Commonly known as: PRILOSEC Take 1 capsule (20 mg total) by mouth daily.   ondansetron  4 MG tablet Commonly known as: Zofran  Take 1 tablet (4 mg total) by mouth every 8 (eight) hours as needed for nausea or vomiting.   oxyCODONE  5 MG immediate release tablet Commonly known as: Oxy IR/ROXICODONE  Take 0.5 tablets (2.5 mg total) by mouth every 4 (four) hours as needed for severe pain (pain score 7-10) or breakthrough pain.   polyethylene glycol 17 g packet Commonly known as: MIRALAX  / GLYCOLAX  Take 17 g by mouth daily. What changed:  when to take this reasons to take this   potassium chloride  10 MEQ tablet Commonly known as: KLOR-CON  M TAKE 1 TABLET EVERY DAY (TAKE WHILE TAKING DEMADEX /TORSEMIDE  AS DIRECTED)   sertraline  25 MG tablet Commonly known as: ZOLOFT  Take 1 tablet (25 mg total) by mouth daily.   simvastatin  20 MG tablet Commonly known as: ZOCOR  Take 1 tablet (20  mg total) by mouth at bedtime.   torsemide  20 MG tablet Commonly known as: DEMADEX  Take 1 tablet (20 mg total) by mouth every Monday, Wednesday, and Friday.   traZODone  50 MG tablet Commonly known as: DESYREL  Take 1 tablet (50 mg total) by  mouth at bedtime as needed for sleep.         Relevant Imaging Results:  Relevant Lab Results:   Additional Information SSN: [id number] [place]

## 2024-06-25 NOTE — Progress Notes (Signed)
 Heart Failure Nurse Navigator Progress Note  PCP: Severa Rock HERO, FNP PCP-Cardiologist: Lavona Agent, MD Admission Diagnosis: Hypervolemia, unspecified hypervolemia type  Hyponatremia Admitted from: [facility]  Presentation:   Norma Barajas presented with hyponatremia, continued ongoing pain to the right side of her back secondary to a recent fall with some associated generalized weakness.  Increased swelling to bilateral extremities but denied any pain in this area. Hx: Chronic HFpEF, pulmonary hypertension with cor pulmonale, PAF not on anticoagulation, CAD, HTN. Seen recently by PCP who noted that the swelling may be secondary to her low protein levels. Patient denies any active chest pain or SOB. Per family patient has had poor P.O. intake. BNP 1,227. Chest x-ray: Nodular opacity in the right lung base, which may represent atelectasis or a developing bronchopneumonia.  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 Nonobstructive minimal CAD 10/2005.   Depression    Diastolic dysfunction    Grade 1. Ejection fraction 60-65%.   Dysrhythmia    a fib   Erosive esophagitis    Essential hypertension    Fracture of ramus of right pubis with routine healing 12/09/2020   GERD (gastroesophageal reflux disease)    Grade III hemorrhoids    History of hiatal hernia    History of prediabetes    Hypercholesterolemia    Internal hemorrhoids with complication 07/29/2008   OCT 2015 FLEX SIG/IH BANDING     Intertrochanteric fracture of femur (HCC)    5/16 - 03/26/2021 fracture sustained in a mechanical fall.  IM nailing performed 5/17 by Dr Oneil Horde Warfarin held because of posttraumatic pelvic hematoma.  Postop DVT  with 81 mg aspirin  twice daily x28 days.   Major depressive disorder, recurrent, moderate (HCC) 12/09/2020   Migraine    used to have them right bad; I don't now (04/11/2015)   MITRAL REGURGITATION 04/27/2010   Qualifier: Diagnosis of  By: Delford, MD, CODY Maude Dunnings    Osteoporosis    Paroxysmal atrial fibrillation [facility])    Pelvic hematoma, female 03/27/2021   Sustained in mechanical fall.  Warfarin held.  IM nailing performed 5/17 with postop DVT prophylaxis 81 mg twice daily x28 days.   PONV (postoperative nausea and vomiting)    Vertigo      Social History   Socioeconomic History   Marital status: Widowed    Spouse name: george    Number of children: 1   Years of education: Not on file   Highest education level: Not on file  Occupational History   Occupation: Retired    Comment: Textile  Tobacco Use   Smoking status: Never   Smokeless tobacco: Never  Vaping Use   Vaping status: Never Used  Substance and Sexual Activity   Alcohol use: No    Alcohol/week: 0.0 standard drinks of alcohol   Drug use: No   Sexual activity: Not Currently    Birth control/protection: Surgical, Post-menopausal    Comment: hyst  Other Topics Concern   Not on file  Social History Narrative   Married   No regular exercise   Social Drivers of Health   Financial Resource Strain: Low Risk  (07/04/2023)   Overall Financial  Resource Strain (CARDIA)    Difficulty of Paying Living Expenses: Not hard at all  Food Insecurity: No Food Insecurity (06/22/2024)   Hunger Vital Sign    Worried About Running Out of Food in the Last Year: Never true    Ran Out of Food in the Last Year: Never true  Transportation Needs: No Transportation Needs (06/22/2024)   PRAPARE - Administrator, Civil Service (Medical): No    Lack of Transportation (Non-Medical): No  Physical Activity: Unknown (07/04/2023)   Exercise Vital Sign    Days of Exercise per Week: 0 days    Minutes of Exercise per Session:  Not on file  Stress: Stress Concern Present (07/04/2023)   [employer] of Occupational Health - Occupational Stress Questionnaire    Feeling of Stress : To some extent  Social Connections: Socially Isolated (06/22/2024)   Social Connection and Isolation Panel    Frequency of Communication with Friends and Family: More than three times a week    Frequency of Social Gatherings with Friends and Family: More than three times a week    Attends Religious Services: Never    Database administrator or Organizations: No    Attends Banker Meetings: Never    Marital Status: Widowed  Education Assessment and Provision:  Unable to provide patient with any detailed education and instructions on heart failure disease management because of her Hearing Impairment via telephone remotely @ [facility].  Anticipated future follow-up appointments-scheduled at [facility] on 07/02/24 @ 10:00 AM.  Communicated this appt through her nurse @ [facility], Charity fundraiser.  Included this appointment and details on her discharge paperwork. Asked RN to provide education materials:  Living Better With Heart Failure Booklet, HF zone tool, & Daily Weight Tracker Tool to patient prior to discharge to [facility].    High Risk Criteria for Readmission and/or Poor Patient Outcomes: Heart failure hospital admissions (last 6 months): 2  No Show rate: 2% Difficult social situation: Patient has Hearing Impairments Demonstrates medication adherence: Unsure Primary Language: English Literacy level: Unsure  Barriers of Care:   Provide patient with Heart Failure Education in person due to Hearing Impairment.  Considerations/Referrals:   Referral made to Heart Failure Pharmacist Stewardship: Yes Referral made to Heart Failure CSW/NCM TOC: No Referral made to Heart & Vascular TOC clinic: Yes. [facility]. 07/02/24 @ 10:00 AM  Items for  Follow-up on DC/TOC: Heart Failure Education will need to be provided to patient since she could not communicate with HF Navigator over the phone.  Not able to reach her Niece contact on file to interview, review HF education and share details about follow-up appointment with patient.  Bedside nurse was asked to provide patient with Heart Failure Education booklet. Daily Weights Diet & Fluid Restrictions   Charmaine Pines, RN, BSN [facility] Heart Failure Navigator Secure Chat Only

## 2024-06-25 NOTE — Care Management Important Message (Signed)
 Important Message  Patient Details  Name: Norma Barajas MRN: [id number] Date of Birth: 08-18-1941   Important Message Given:  N/A - LOS <3 / Initial given by admissions     [name] [name] 06/25/2024, 12:07 PM

## 2024-06-25 NOTE — Discharge Summary (Signed)
 Physician Discharge Summary  Norma Barajas FMW:[id number] DOB: [birth year] DOA: 06/22/2024  PCP: Severa Rock HERO, FNP  Admit date: 06/22/2024 Discharge date: 06/25/2024  Recommendations for Outpatient Follow-up:  Follow up with PCP in 2 weeks Please obtain BMP/CBC in 1-2 weeks  Home Health: PT/RN   Discharge Condition: STABLE   CODE STATUS: DNR DIET: regular   Brief Hospitalization Summary: Please see all hospital notes, images, labs for full details of the hospitalization. Admission provider HPI:  83 y.o. female with medical history significant of chronic HFpEF, pulmonary hypertension with cor pulmonale, PAF not on anticoagulation, CAD, HTN sent from nursing home for evaluation of worsening of leg swelling and worsening of hyponatremia.   Patient has hard hearing, daughter at bedside gave history.  Patient has a history of CHF taking torsemide  twice daily, starting 3 weeks ago patient started develop worsening of peripheral edema and nursing home decided to increase torsemide  to 3 times a day for the last 2 weeks.  However patient continued to gain weight and worsening of peripheral edema and today blood work showed worsening of hyponatremia and patient sent to ED.  Patient denied any cough shortness of breath chest pains fever chills.   ED Course: Afebrile, blood pressure 130/80 O2 saturation 94% on room air.  Blood work showed sodium 127, BUN 22 creatinine 0.8 glucose 123 WBC 10.6 hemoglobin 13.4. Chest x-ray negative for acute findings, patient was given IV Lasix  20 mg in the ED.  Hospital Course by listed problems addressed   Acute on chronic HFpEF  Acute on chronic right-sided CHF/cor pulmonale - Failed outpatient management - continue diuresis IV Lasix  40 mg daily, s/p 1 dose of metolazone  - Change BP/CHF medications, discontinued Cardizem  due to increasing peripheral edema, increase metoprolol  from 100 to 150 mg daily   Hyponatremia, acute on chronic - Hypervolemic, fluid  overload - Worsening of sodium level secondary to fluid retention from CHF, sedation, management as above - sodium now back to baseline after diuresis and patient remains asymptomatic   Hypokalemia - replacement ordered - repleted, continue daily supplementation   HTN - discontinued Cardizem  due to worsening of peripheral edema - continue metoprolol  succinate 150 mg BID - hydralazine  plus Imdur  regimen started on admission for better BP control   PAF - Rate control as above - Not on anticoagulation due to history of unsteady gait and frequent falls.  According to cardiologist note, not a candidate for Watchman procedure due to overall condition and frailty   Deconditioning - PT consulted and evaluated while inpatient   Discharge Diagnoses:  Principal Problem:   CHF (congestive heart failure) (HCC) Active Problems:   Acute on chronic diastolic CHF (congestive heart failure) (HCC)   Discharge Instructions:  Allergies as of 06/25/2024       Reactions   Codeine Nausea And Vomiting   Triamterene-hctz Other (See Comments)   Weakness    Atorvastatin Other (See Comments)   Myalgias   Crestor [rosuvastatin Calcium ] Other (See Comments)   Weakness    Doxycycline  Other (See Comments)   Chest congestion   Morphine  Nausea Only   Risedronate Sodium Other (See Comments)   ACTONEL - reflux        Medication List     STOP taking these medications    diltiazem  240 MG 24 hr capsule Commonly known as: CARDIZEM  CD   naproxen  375 MG tablet Commonly known as: NAPROSYN        TAKE these medications    acetaminophen  325 MG tablet Commonly  known as: TYLENOL  Take 2 tablets (650 mg total) by mouth 3 (three) times daily. What changed:  when to take this reasons to take this   aluminum-magnesium  hydroxide 200-200 MG/5ML suspension Take 30 mLs by mouth as needed for indigestion (heartburn). Up to 4 times a day in 24 hours.   aspirin  EC 81 MG tablet Commonly known as:  Aspirin  Low Dose Take 1 tablet (81 mg total) by mouth daily with breakfast. Swallow whole.   cholecalciferol  25 MCG (1000 UNIT) tablet Commonly known as: VITAMIN D3 Take 1,000 Units by mouth in the morning.   feeding supplement Liqd Take 237 mLs by mouth 2 (two) times daily between meals. What changed: when to take this   hydrALAZINE  10 MG tablet Commonly known as: APRESOLINE  Take 1 tablet (10 mg total) by mouth 3 (three) times daily.   isosorbide  mononitrate 30 MG 24 hr tablet Commonly known as: IMDUR  Take 1 tablet (30 mg total) by mouth daily. Start taking on: June 26, 2024   lidocaine  5 % Commonly known as: LIDODERM  Place 2 patches onto the skin daily as needed (back pain). Remove & Discard patch within 12 hours or as directed by MD   meclizine  25 MG tablet Commonly known as: ANTIVERT  Take 1 tablet (25 mg total) by mouth 3 (three) times daily as needed for dizziness.   metoprolol  succinate 50 MG 24 hr tablet Commonly known as: TOPROL -XL Take 3 tablets (150 mg total) by mouth 2 (two) times daily. Take with or immediately following a meal. What changed:  medication strength how much to take   midodrine  2.5 MG tablet Commonly known as: PROAMATINE  Take 1 tablet (2.5 mg total) by mouth 2 (two) times daily with a meal.   omeprazole  20 MG capsule Commonly known as: PRILOSEC Take 1 capsule (20 mg total) by mouth daily.   ondansetron  4 MG tablet Commonly known as: Zofran  Take 1 tablet (4 mg total) by mouth every 8 (eight) hours as needed for nausea or vomiting.   oxyCODONE  5 MG immediate release tablet Commonly known as: Oxy IR/ROXICODONE  Take 0.5 tablets (2.5 mg total) by mouth every 4 (four) hours as needed for severe pain (pain score 7-10) or breakthrough pain.   polyethylene glycol 17 g packet Commonly known as: MIRALAX  / GLYCOLAX  Take 17 g by mouth daily. What changed:  when to take this reasons to take this   potassium chloride  10 MEQ tablet Commonly known  as: KLOR-CON  M TAKE 1 TABLET EVERY DAY (TAKE WHILE TAKING DEMADEX /TORSEMIDE  AS DIRECTED)   sertraline  25 MG tablet Commonly known as: ZOLOFT  Take 1 tablet (25 mg total) by mouth daily.   simvastatin  20 MG tablet Commonly known as: ZOCOR  Take 1 tablet (20 mg total) by mouth at bedtime.   torsemide  20 MG tablet Commonly known as: DEMADEX  Take 1 tablet (20 mg total) by mouth every Monday, Wednesday, and Friday.   traZODone  50 MG tablet Commonly known as: DESYREL  Take 1 tablet (50 mg total) by mouth at bedtime as needed for sleep.        Follow-up Information     Severa Rock HERO, FNP Follow up in 1 week(s).   Specialty: Family Medicine Why: Hospital Follow Up Contact information: [address] [place] [address] [phone]                Allergies  Allergen Reactions   Codeine Nausea And Vomiting   Triamterene-Hctz Other (See Comments)    Weakness    Atorvastatin Other (  See Comments)    Myalgias    Crestor [Rosuvastatin Calcium ] Other (See Comments)    Weakness    Doxycycline  Other (See Comments)    Chest congestion   Morphine  Nausea Only   Risedronate Sodium Other (See Comments)    ACTONEL - reflux   Allergies as of 06/25/2024       Reactions   Codeine Nausea And Vomiting   Triamterene-hctz Other (See Comments)   Weakness    Atorvastatin Other (See Comments)   Myalgias   Crestor [rosuvastatin Calcium ] Other (See Comments)   Weakness    Doxycycline  Other (See Comments)   Chest congestion   Morphine  Nausea Only   Risedronate Sodium Other (See Comments)   ACTONEL - reflux        Medication List     STOP taking these medications    diltiazem  240 MG 24 hr capsule Commonly known as: CARDIZEM  CD   naproxen  375 MG tablet Commonly known as: NAPROSYN        TAKE these medications    acetaminophen  325 MG tablet Commonly known as: TYLENOL  Take 2 tablets (650 mg total) by mouth 3 (three) times daily. What changed:  when to take  this reasons to take this   aluminum-magnesium  hydroxide 200-200 MG/5ML suspension Take 30 mLs by mouth as needed for indigestion (heartburn). Up to 4 times a day in 24 hours.   aspirin  EC 81 MG tablet Commonly known as: Aspirin  Low Dose Take 1 tablet (81 mg total) by mouth daily with breakfast. Swallow whole.   cholecalciferol  25 MCG (1000 UNIT) tablet Commonly known as: VITAMIN D3 Take 1,000 Units by mouth in the morning.   feeding supplement Liqd Take 237 mLs by mouth 2 (two) times daily between meals. What changed: when to take this   hydrALAZINE  10 MG tablet Commonly known as: APRESOLINE  Take 1 tablet (10 mg total) by mouth 3 (three) times daily.   isosorbide  mononitrate 30 MG 24 hr tablet Commonly known as: IMDUR  Take 1 tablet (30 mg total) by mouth daily. Start taking on: June 26, 2024   lidocaine  5 % Commonly known as: LIDODERM  Place 2 patches onto the skin daily as needed (back pain). Remove & Discard patch within 12 hours or as directed by MD   meclizine  25 MG tablet Commonly known as: ANTIVERT  Take 1 tablet (25 mg total) by mouth 3 (three) times daily as needed for dizziness.   metoprolol  succinate 50 MG 24 hr tablet Commonly known as: TOPROL -XL Take 3 tablets (150 mg total) by mouth 2 (two) times daily. Take with or immediately following a meal. What changed:  medication strength how much to take   midodrine  2.5 MG tablet Commonly known as: PROAMATINE  Take 1 tablet (2.5 mg total) by mouth 2 (two) times daily with a meal.   omeprazole  20 MG capsule Commonly known as: PRILOSEC Take 1 capsule (20 mg total) by mouth daily.   ondansetron  4 MG tablet Commonly known as: Zofran  Take 1 tablet (4 mg total) by mouth every 8 (eight) hours as needed for nausea or vomiting.   oxyCODONE  5 MG immediate release tablet Commonly known as: Oxy IR/ROXICODONE  Take 0.5 tablets (2.5 mg total) by mouth every 4 (four) hours as needed for severe pain (pain score 7-10) or  breakthrough pain.   polyethylene glycol 17 g packet Commonly known as: MIRALAX  / GLYCOLAX  Take 17 g by mouth daily. What changed:  when to take this reasons to take this   potassium chloride  10 MEQ  tablet Commonly known as: KLOR-CON  M TAKE 1 TABLET EVERY DAY (TAKE WHILE TAKING DEMADEX /TORSEMIDE  AS DIRECTED)   sertraline  25 MG tablet Commonly known as: ZOLOFT  Take 1 tablet (25 mg total) by mouth daily.   simvastatin  20 MG tablet Commonly known as: ZOCOR  Take 1 tablet (20 mg total) by mouth at bedtime.   torsemide  20 MG tablet Commonly known as: DEMADEX  Take 1 tablet (20 mg total) by mouth every Monday, Wednesday, and Friday.   traZODone  50 MG tablet Commonly known as: DESYREL  Take 1 tablet (50 mg total) by mouth at bedtime as needed for sleep.        Procedures/Studies: DG Chest 1 View Result Date: 06/22/2024 CLINICAL DATA:  edema EXAM: CHEST  1 VIEW COMPARISON:  June 12, 2024 FINDINGS: Low lung volumes with chronic interstitial opacities throughout both lungs. Nodular opacity in the right lung base. No pleural effusion or pneumothorax. Mild cardiomegaly. Tortuous aorta with aortic atherosclerosis. No acute fracture or destructive lesions. Multilevel thoracic osteophytosis. Osteopenia. IMPRESSION: Nodular opacity in the right lung base, which may represent atelectasis or a developing bronchopneumonia, in the correct clinical context. Electronically Signed   By: Rogelia Myers M.D.   On: 06/22/2024 17:20   DG Chest Port 1 View Result Date: 06/12/2024 EXAM: 1 VIEW XRAY OF THE CHEST 06/12/2024 01:34:00 PM COMPARISON: AP radiograph of the chest dated 06/08/2024. CLINICAL HISTORY: SOB. Pt. In today from [facility]. Pt. Fell on 06/08/2024 with no injuries but was at followup appointment at doctors office. Family states to EMS that she is more lethargic. Pt. States I just don't feel well. Pt. Does have Afib (controlled). Pt. Is alert now but only oriented to self and place. Hx  of hypertension, AFIB. Non smoker. FINDINGS: LUNGS AND PLEURA: Prominent interstitial opacities which appear chronic. No focal pulmonary opacity. No pulmonary edema. No pleural effusion. No pneumothorax. HEART AND MEDIASTINUM: The heart is mildly enlarged. There is calcification within the aortic arch. No acute abnormality of the cardiac and mediastinal silhouettes. BONES AND SOFT TISSUES: No acute osseous abnormality. IMPRESSION: 1. No acute process. 2. Mildly enlarged heart. 3. Prominent interstitial opacities, likely chronic. Electronically signed by: evalene coho 06/12/2024 02:26 PM EDT RP Workstation: [id number]   CT Head Wo Contrast Result Date: 06/12/2024 EXAM: CT HEAD WITHOUT CONTRAST 06/12/2024 01:53:47 PM TECHNIQUE: CT of the head was performed without the administration of intravenous contrast. Automated exposure control, iterative reconstruction, and/or weight based adjustment of the mA/kV was utilized to reduce the radiation dose to as low as reasonably achievable. COMPARISON: CT of the head dated 09/08/2024. CLINICAL HISTORY: Head trauma, moderate-severe. Fall 06/08/2024, patient states she doesn't feel right. FINDINGS: BRAIN AND VENTRICLES: No acute hemorrhage. Gray-white differentiation is preserved. No hydrocephalus. No extra-axial collection. No mass effect or midline shift. A curvilinear dystrophic calcification is again demonstrated within the left occipital lobe. There is age-related cerebral volume loss and there is mild periventricular white matter disease. ORBITS: The patient is status post bilateral lens replacement. SINUSES: There is mucosal disease within the left maxillary sinus. SOFT TISSUES AND SKULL: No acute soft tissue abnormality. No skull fracture. VASCULATURE: There is moderate chronic atheromatous disease within the carotid siphons and vertebral arteries. IMPRESSION: 1. No acute intracranial abnormality related to the reported head trauma. 2. Curvilinear dystrophic  calcification within the left occipital lobe, age-related cerebral volume loss, and mild periventricular white matter disease. 3. Moderate chronic atheromatous disease within the carotid siphons and vertebral arteries. 4. Mucosal disease within the left maxillary sinus. Electronically  signed by: timothy berrigan 06/12/2024 02:25 PM EDT RP Workstation: [id number]   DG Chest Portable 1 View Result Date: 06/08/2024 CLINICAL DATA:  Unwitnessed fall, tachypnea. EXAM: PORTABLE CHEST 1 VIEW COMPARISON:  November 17, 2023. FINDINGS: Stable cardiomediastinal silhouette. Minimal bibasilar subsegmental atelectasis or scarring is noted. Bony thorax is unremarkable. IMPRESSION: Minimal bibasilar subsegmental atelectasis or scarring. Electronically Signed   By: Lynwood Landy Raddle M.D.   On: 06/08/2024 14:00   CT PELVIS WO CONTRAST Result Date: 06/08/2024 CLINICAL DATA:  Unwitnessed fall.  Left groin pain. EXAM: CT PELVIS WITHOUT CONTRAST TECHNIQUE: Multidetector CT imaging of the pelvis was performed following the standard protocol without intravenous contrast. RADIATION DOSE REDUCTION: This exam was performed according to the departmental dose-optimization program which includes automated exposure control, adjustment of the mA and/or kV according to patient size and/or use of iterative reconstruction technique. COMPARISON:  September 15, 2023. FINDINGS: Urinary Tract:  Urinary bladder is unremarkable. Bowel:  Unremarkable visualized pelvic bowel loops. Vascular/Lymphatic: Aortic atherosclerosis.  No adenopathy. Reproductive:  Status post hysterectomy. Other:  Minimal cholelithiasis.  No definite ascites or hernia. Musculoskeletal: Old bilateral pubic rami fractures are noted. Status post surgical internal fixation of old proximal right femoral fracture. No acute fracture or dislocation is noted. IMPRESSION: No acute abnormality seen.  Stable chronic findings as noted above. Aortic Atherosclerosis (ICD10-I70.0). Electronically  Signed   By: Lynwood Landy Raddle M.D.   On: 06/08/2024 12:24   DG Knee Complete 4 Views Right Result Date: 06/08/2024 CLINICAL DATA:  Fall. EXAM: RIGHT KNEE - COMPLETE 4+ VIEW COMPARISON:  None Available. FINDINGS: Osseous structures are osteopenic. Status post ORIF with plate and screw fixation of the tibia and medullary rod the femur. Joint spaces are maintained. There is a small suprapatellar effusion. No acute fracture. IMPRESSION: Osteopenia. Small effusion. No acute osseous abnormalities. Electronically Signed   By: Fonda Field M.D.   On: 06/08/2024 11:21   DG Hip Unilat W or Wo Pelvis 2-3 Views Right Result Date: 06/08/2024 CLINICAL DATA:  Fall. EXAM: DG HIP (WITH OR WITHOUT PELVIS) 2-3V RIGHT COMPARISON:  None Available. FINDINGS: There is probable acute fracture of the right inferior pubic ramus, marked with electronic arrow sign on image 1. Previously seen old healed fractures of left superior pubic ramus and bilateral inferior pubic rami are again seen. No other acute fracture or dislocation. No aggressive osseous lesion. Visualized sacral arcuate lines are unremarkable. Unremarkable symphysis pubis. There are mild degenerative changes of bilateral hip joints characterized by mildly reduced joint space and osteophytosis of the superior acetabulum. Stable appearance of right proximal femur hardware. No radiopaque foreign bodies. IMPRESSION: Probable acute fracture of the right inferior pubic ramus. Electronically Signed   By: Ree Molt M.D.   On: 06/08/2024 10:46   CT Cervical Spine Wo Contrast Result Date: 06/08/2024 CLINICAL DATA:  83 year old female status post unwitnessed fall this morning. Pain. EXAM: CT CERVICAL SPINE WITHOUT CONTRAST TECHNIQUE: Multidetector CT imaging of the cervical spine was performed without intravenous contrast. Multiplanar CT image reconstructions were also generated. RADIATION DOSE REDUCTION: This exam was performed according to the departmental dose-optimization  program which includes automated exposure control, adjustment of the mA and/or kV according to patient size and/or use of iterative reconstruction technique. COMPARISON:  Head CT today. FINDINGS: Alignment: Exaggerated cervical lordosis. Cervicothoracic junction alignment is within normal limits. Bilateral posterior element alignment is within normal limits. Skull base and vertebrae: Diffuse osteopenia.  Mild motion artifact. Visualized skull base is intact. No atlanto-occipital dissociation. C1  and C2 appear intact and aligned. No acute osseous abnormality identified. Soft tissues and spinal canal: No prevertebral fluid or swelling. No visible canal hematoma. Negative visible noncontrast neck soft tissues aside from calcified carotid atherosclerosis. Disc levels: Pronounced osteopenia but mild for age cervical spine degeneration. Upper chest: Osteopenia continues into the visible upper thoracic levels. Negative lung apices. Partially visible aortic arch calcified atherosclerosis. IMPRESSION: Osteopenia. No acute traumatic injury identified in the cervical spine. Electronically Signed   By: VEAR Hurst M.D.   On: 06/08/2024 10:21   CT Head Wo Contrast Result Date: 06/08/2024 CLINICAL DATA:  83 year old female status post unwitnessed fall this morning. Pain. EXAM: CT HEAD WITHOUT CONTRAST TECHNIQUE: Contiguous axial images were obtained from the base of the skull through the vertex without intravenous contrast. RADIATION DOSE REDUCTION: This exam was performed according to the departmental dose-optimization program which includes automated exposure control, adjustment of the mA and/or kV according to patient size and/or use of iterative reconstruction technique. COMPARISON:  Brain MRI 07/01/2023.  Head CT 04/13/2016. FINDINGS: Brain: Chronic dystrophic calcification in the left occipital lobe stable since 2017. No midline shift, ventriculomegaly, mass effect, evidence of mass lesion, intracranial hemorrhage or evidence  of cortically based acute infarction. Mild for age periventricular white matter hypodensity, stable from MRI last year. Vascular: Calcified atherosclerosis at the skull base. No suspicious intracranial vascular hyperdensity. Skull: Osteopenia. Stable since 2017. No acute osseous abnormality identified. Sinuses/Orbits: Visualized paranasal sinuses and mastoids are stable and well aerated. Other: No orbit or scalp acute soft tissue injury identified. Scalp venous varices similar to the MRI last year. IMPRESSION: 1. No acute intracranial abnormality or acute traumatic injury identified. 2. 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 Electronically Signed   By: VEAR Hurst M.D.   On: 06/08/2024 10:19     Subjective: Pt says says she has had several bowel movements since enema was given yesterday.   Discharge Exam: Vitals:   06/24/24 1931 06/25/24 0400  BP: 115/78 (!) 157/98  Pulse: 95 92  Resp: 18 18  Temp: 97.9 F (36.6 C) 98.2 F (36.8 C)  SpO2: 96% 96%   Vitals:   06/24/24 1800 06/24/24 1931 06/25/24 0400 06/25/24 0411  BP: 120/75 115/78 (!) 157/98   Pulse: 91 95 92   Resp:  18 18   Temp:  97.9 F (36.6 C) 98.2 F (36.8 C)   TempSrc:  Oral Oral   SpO2:  96% 96%   Weight:    49 kg  Height:       General exam: Appears calm and comfortable  Respiratory system: Clear to auscultation. Respiratory effort normal. Cardiovascular system: normal S1 & S2 heard. No JVD, murmurs, rubs, gallops or clicks. No pedal edema. Gastrointestinal system: Abdomen is nondistended, soft and nontender. No organomegaly or masses felt. Normal bowel sounds heard. Central nervous system: Alert and oriented. No focal neurological deficits. Extremities: Symmetric 5 x 5 power. Skin: No rashes, lesions or ulcers. Psychiatry: Judgement and insight appear normal. Mood & affect appropriate.     The results of significant diagnostics from this hospitalization (including imaging,  microbiology, ancillary and laboratory) are listed below for reference.     Microbiology: No results found for this or any previous visit (from the past 240 hours).   Labs: BNP (last 3 results) Recent Labs    08/07/23 1330 06/22/24 1605  BNP 723.0* 1,227.0*   Basic Metabolic Panel: Recent Labs  Lab 06/21/24 1120 06/22/24 1601 06/23/24 0505 06/24/24  0518  NA 123* 127* 130* 129*  K 5.6* 4.5 2.8* 3.5  CL 90* 92* 91* 90*  CO2 19* 23 29 28   GLUCOSE 125* 123* 98 84  BUN 18 22 19 13   CREATININE 0.74 0.84 0.73 0.65  CALCIUM  8.7 8.2* 7.9* 8.1*  MG  --  2.0 1.9  --    Liver Function Tests: Recent Labs  Lab 06/21/24 1120 06/22/24 1601  AST 22 20  ALT 22 20  ALKPHOS 164* 146*  BILITOT 0.8 1.3*  PROT 6.0 6.1*  ALBUMIN 3.8 3.3*   No results for input(s): LIPASE, AMYLASE in the last 168 hours. No results for input(s): AMMONIA in the last 168 hours. CBC: Recent Labs  Lab 06/21/24 1120 06/22/24 1601  WBC 9.8 10.6*  NEUTROABS 7.6* 8.2*  HGB 13.5 13.4  HCT 41.2 40.0  MCV 104* 100.3*  PLT 194 181   Cardiac Enzymes: No results for input(s): CKTOTAL, CKMB, CKMBINDEX, TROPONINI in the last 168 hours. BNP: Invalid input(s): POCBNP CBG: No results for input(s): GLUCAP in the last 168 hours. D-Dimer No results for input(s): DDIMER in the last 72 hours. Hgb A1c No results for input(s): HGBA1C in the last 72 hours. Lipid Profile No results for input(s): CHOL, HDL, LDLCALC, TRIG, CHOLHDL, LDLDIRECT in the last 72 hours. Thyroid  function studies No results for input(s): TSH, T4TOTAL, T3FREE, THYROIDAB in the last 72 hours.  Invalid input(s): FREET3 Anemia work up No results for input(s): VITAMINB12, FOLATE, FERRITIN, TIBC, IRON , RETICCTPCT in the last 72 hours. Urinalysis    Component Value Date/Time   COLORURINE YELLOW 06/12/2024 1322   APPEARANCEUR CLEAR 06/12/2024 1322   APPEARANCEUR Clear 09/06/2023 1126    LABSPEC 1.017 06/12/2024 1322   PHURINE 5.0 06/12/2024 1322   GLUCOSEU NEGATIVE 06/12/2024 1322   HGBUR NEGATIVE 06/12/2024 1322   BILIRUBINUR NEGATIVE 06/12/2024 1322   BILIRUBINUR Negative 09/06/2023 1126   KETONESUR NEGATIVE 06/12/2024 1322   PROTEINUR NEGATIVE 06/12/2024 1322   UROBILINOGEN negative 02/06/2015 1252   NITRITE NEGATIVE 06/12/2024 1322   LEUKOCYTESUR NEGATIVE 06/12/2024 1322   Sepsis Labs Recent Labs  Lab 06/21/24 1120 06/22/24 1601  WBC 9.8 10.6*   Microbiology No results found for this or any previous visit (from the past 240 hours).  Time coordinating discharge: 35 mins  SIGNED:  Afton Louder, MD  [facility] 06/25/2024, 10:32 AM How to contact the [facility] Attending or Consulting provider 7A - 7P or covering provider during after hours 7P -7A, for this patient?  Check the care team in [facility] and look for a) attending/consulting [facility] provider listed and b) the [facility] team listed Log into [email or web address] and use [facility] universal password to access. If you do not have the password, please contact the hospital operator. Locate the [facility] provider you are looking for under [facility] and page to a number that you can be directly reached. If you still have difficulty reaching the provider, please page the [facility] (Director on Call) for the Hospitalists listed on amion for assistance.

## 2024-06-25 NOTE — Progress Notes (Signed)
 Heart Failure Navigator Progress Note Heart Failure Navigator tried to call patient remotely to her room 338 @ [facility].  Patient answered the phone but is Hearing Impaired so she could not communicate with me.  Attempted to call her niece that was on file with no answer.  Reached out to her RN via secure chat to discuss hospital follow-up and HF education with her.   Charmaine Pines, RN, BSN [facility] Heart Failure Navigator Secure Chat Only

## 2024-06-25 NOTE — TOC Transition Note (Signed)
 Transition of Care [facility]) - Discharge Note   Patient Details  Name: Norma Barajas MRN: [id number] Date of Birth: [birth year]  Transition of Care [facility]) CM/SW Contact:  Lucie Lunger, LCSWA Phone Number: 06/25/2024, 12:03 PM   Clinical Narrative:    CSW updated that pt is medically stable for D/C. CSW sent updated Fl2 and D/C summary to [facility] with [facility] ALF to review. CSW confirmed NP is ready for pt to return today. CSW updated Darleene with [facility] of plan for D/C and new orders being placed by MD. Med necessity printed and sent to floor for RN. CSW updated RN that EMS has been called for transport back to ALF. TOC signing off.   Final next level of care: Assisted Living Barriers to Discharge: Barriers Resolved   Patient Goals and CMS Choice Patient states their goals for this hospitalization and ongoing recovery are:: return to ALF CMS Medicare.gov Compare Post Acute Care list provided to:: Patient Choice offered to / list presented to : Patient      Discharge Placement                       Discharge Plan and Services Additional resources added to the After Visit Summary for                            [facility] Arranged: PT [facility] Agency: [facility] Date [facility] Agency Contacted: 06/25/24   Representative spoke with at [facility] Agency: Darleene  Social Drivers of Health (SDOH) Interventions SDOH Screenings   Food Insecurity: No Food Insecurity (06/22/2024)  Housing: Low Risk  (06/22/2024)  Transportation Needs: No Transportation Needs (06/22/2024)  Utilities: Not At Risk (06/22/2024)  Depression (PHQ2-9): Low Risk  (05/27/2023)  Financial Resource Strain: Low Risk  (07/04/2023)  Physical Activity: Unknown (07/04/2023)  Social Connections: Socially Isolated (06/22/2024)  Stress: Stress Concern Present (07/04/2023)  Tobacco Use: Low Risk  (06/21/2024)     Readmission Risk Interventions    06/25/2024   12:02 PM  Readmission Risk Prevention Plan  Transportation Screening  Complete  Home Care Screening Complete  Medication Review (RN CM) Complete

## 2024-06-25 NOTE — Discharge Instructions (Signed)
IMPORTANT INFORMATION: PAY CLOSE ATTENTION   PHYSICIAN DISCHARGE INSTRUCTIONS  Follow with Primary care provider  Rakes, Linda M, FNP  and other consultants as instructed by your Hospitalist Physician  SEEK MEDICAL CARE OR RETURN TO EMERGENCY ROOM IF SYMPTOMS COME BACK, WORSEN OR NEW PROBLEM DEVELOPS   Please note: You were cared for by a hospitalist during your hospital stay. Every effort will be made to forward records to your primary care provider.  You can request that your primary care provider send for your hospital records if they have not received them.  Once you are discharged, your primary care physician will handle any further medical issues. Please note that NO REFILLS for any discharge medications will be authorized once you are discharged, as it is imperative that you return to your primary care physician (or establish a relationship with a primary care physician if you do not have one) for your post hospital discharge needs so that they can reassess your need for medications and monitor your lab values.  Please get a complete blood count and chemistry panel checked by your Primary MD at your next visit, and again as instructed by your Primary MD.  Get Medicines reviewed and adjusted: Please take all your medications with you for your next visit with your Primary MD  Laboratory/radiological data: Please request your Primary MD to go over all hospital tests and procedure/radiological results at the follow up, please ask your primary care provider to get all Hospital records sent to his/her office.  In some cases, they will be blood work, cultures and biopsy results pending at the time of your discharge. Please request that your primary care provider follow up on these results.  If you are diabetic, please bring your blood sugar readings with you to your follow up appointment with primary care.    Please call and make your follow up appointments as soon as possible.    Also Note  the following: If you experience worsening of your admission symptoms, develop shortness of breath, life threatening emergency, suicidal or homicidal thoughts you must seek medical attention immediately by calling 911 or calling your MD immediately  if symptoms less severe.  You must read complete instructions/literature along with all the possible adverse reactions/side effects for all the Medicines you take and that have been prescribed to you. Take any new Medicines after you have completely understood and accpet all the possible adverse reactions/side effects.   Do not drive when taking Pain medications or sleeping medications (Benzodiazepines)  Do not take more than prescribed Pain, Sleep and Anxiety Medications. It is not advisable to combine anxiety,sleep and pain medications without talking with your primary care practitioner  Special Instructions: If you have smoked or chewed Tobacco  in the last 2 yrs please stop smoking, stop any regular Alcohol  and or any Recreational drug use.  Wear Seat belts while driving.  Do not drive if taking any narcotic, mind altering or controlled substances or recreational drugs or alcohol.       

## 2024-06-25 NOTE — Plan of Care (Signed)
 Pt c/o chronic back pain, PRN oxy given X2. Pt also had several small to medium loose incontinent Bms overnight after receiving enema yesterday, due to this purewick removed to reduce risk of UTI at this time.    Problem: Education: Goal: Knowledge of General Education information will improve Description: Including pain rating scale, medication(s)/side effects and non-pharmacologic comfort measures Outcome: Progressing   Problem: Health Behavior/Discharge Planning: Goal: Ability to manage health-related needs will improve Outcome: Progressing   Problem: Clinical Measurements: Goal: Ability to maintain clinical measurements within normal limits will improve Outcome: Progressing Goal: Will remain free from infection Outcome: Progressing Goal: Diagnostic test results will improve Outcome: Progressing Goal: Respiratory complications will improve Outcome: Progressing Goal: Cardiovascular complication will be avoided Outcome: Progressing   Problem: Activity: Goal: Risk for activity intolerance will decrease Outcome: Progressing   Problem: Nutrition: Goal: Adequate nutrition will be maintained Outcome: Progressing   Problem: Coping: Goal: Level of anxiety will decrease Outcome: Progressing   Problem: Elimination: Goal: Will not experience complications related to bowel motility Outcome: Progressing Goal: Will not experience complications related to urinary retention Outcome: Progressing   Problem: Pain Managment: Goal: General experience of comfort will improve and/or be controlled Outcome: Progressing   Problem: Safety: Goal: Ability to remain free from injury will improve Outcome: Progressing   Problem: Skin Integrity: Goal: Risk for impaired skin integrity will decrease Outcome: Progressing

## 2024-06-26 ENCOUNTER — Other Ambulatory Visit: Payer: Self-pay | Admitting: Family Medicine

## 2024-06-26 ENCOUNTER — Encounter: Payer: Self-pay | Admitting: Family Medicine

## 2024-06-26 DIAGNOSIS — R110 Nausea: Secondary | ICD-10-CM

## 2024-06-26 DIAGNOSIS — R42 Dizziness and giddiness: Secondary | ICD-10-CM

## 2024-06-27 ENCOUNTER — Other Ambulatory Visit: Payer: Self-pay | Admitting: Family Medicine

## 2024-06-27 ENCOUNTER — Ambulatory Visit

## 2024-06-27 DIAGNOSIS — N179 Acute kidney failure, unspecified: Secondary | ICD-10-CM | POA: Diagnosis not present

## 2024-06-27 DIAGNOSIS — I50812 Chronic right heart failure: Secondary | ICD-10-CM

## 2024-06-27 DIAGNOSIS — G9341 Metabolic encephalopathy: Secondary | ICD-10-CM

## 2024-06-27 DIAGNOSIS — F419 Anxiety disorder, unspecified: Secondary | ICD-10-CM

## 2024-06-27 DIAGNOSIS — E871 Hypo-osmolality and hyponatremia: Secondary | ICD-10-CM | POA: Diagnosis not present

## 2024-06-27 DIAGNOSIS — R627 Adult failure to thrive: Secondary | ICD-10-CM

## 2024-06-27 DIAGNOSIS — I110 Hypertensive heart disease with heart failure: Secondary | ICD-10-CM

## 2024-06-27 DIAGNOSIS — H9190 Unspecified hearing loss, unspecified ear: Secondary | ICD-10-CM

## 2024-06-27 DIAGNOSIS — I5032 Chronic diastolic (congestive) heart failure: Secondary | ICD-10-CM | POA: Diagnosis not present

## 2024-06-27 DIAGNOSIS — I480 Paroxysmal atrial fibrillation: Secondary | ICD-10-CM

## 2024-06-27 DIAGNOSIS — F331 Major depressive disorder, recurrent, moderate: Secondary | ICD-10-CM

## 2024-06-27 DIAGNOSIS — R110 Nausea: Secondary | ICD-10-CM

## 2024-06-27 DIAGNOSIS — I2510 Atherosclerotic heart disease of native coronary artery without angina pectoris: Secondary | ICD-10-CM

## 2024-06-27 DIAGNOSIS — E860 Dehydration: Secondary | ICD-10-CM | POA: Diagnosis not present

## 2024-06-27 DIAGNOSIS — F339 Major depressive disorder, recurrent, unspecified: Secondary | ICD-10-CM

## 2024-06-27 DIAGNOSIS — R42 Dizziness and giddiness: Secondary | ICD-10-CM

## 2024-06-27 MED ORDER — ONDANSETRON HCL 4 MG PO TABS: 4.0000 mg | ORAL_TABLET | Freq: Three times a day (TID) | ORAL | 5 refills | Status: DC | PRN

## 2024-06-27 MED ORDER — ISOSORBIDE MONONITRATE ER 30 MG PO TB24: 30.0000 mg | ORAL_TABLET | Freq: Every day | ORAL | 0 refills | Status: DC

## 2024-06-27 MED ORDER — SERTRALINE HCL 25 MG PO TABS: 25.0000 mg | ORAL_TABLET | Freq: Every day | ORAL | 3 refills | Status: DC

## 2024-06-27 MED ORDER — HYDRALAZINE HCL 10 MG PO TABS: 10.0000 mg | ORAL_TABLET | Freq: Three times a day (TID) | ORAL | 0 refills | Status: DC

## 2024-06-28 DIAGNOSIS — G9341 Metabolic encephalopathy: Secondary | ICD-10-CM | POA: Diagnosis not present

## 2024-06-28 DIAGNOSIS — I2510 Atherosclerotic heart disease of native coronary artery without angina pectoris: Secondary | ICD-10-CM | POA: Diagnosis not present

## 2024-06-28 DIAGNOSIS — N179 Acute kidney failure, unspecified: Secondary | ICD-10-CM | POA: Diagnosis not present

## 2024-06-28 DIAGNOSIS — I110 Hypertensive heart disease with heart failure: Secondary | ICD-10-CM | POA: Diagnosis not present

## 2024-06-28 DIAGNOSIS — I480 Paroxysmal atrial fibrillation: Secondary | ICD-10-CM | POA: Diagnosis not present

## 2024-06-28 DIAGNOSIS — E860 Dehydration: Secondary | ICD-10-CM | POA: Diagnosis not present

## 2024-06-28 DIAGNOSIS — E871 Hypo-osmolality and hyponatremia: Secondary | ICD-10-CM | POA: Diagnosis not present

## 2024-06-28 DIAGNOSIS — I5032 Chronic diastolic (congestive) heart failure: Secondary | ICD-10-CM | POA: Diagnosis not present

## 2024-06-28 DIAGNOSIS — I50812 Chronic right heart failure: Secondary | ICD-10-CM | POA: Diagnosis not present

## 2024-06-29 DIAGNOSIS — G9341 Metabolic encephalopathy: Secondary | ICD-10-CM | POA: Diagnosis not present

## 2024-06-29 DIAGNOSIS — I2510 Atherosclerotic heart disease of native coronary artery without angina pectoris: Secondary | ICD-10-CM | POA: Diagnosis not present

## 2024-06-29 DIAGNOSIS — E871 Hypo-osmolality and hyponatremia: Secondary | ICD-10-CM | POA: Diagnosis not present

## 2024-06-29 DIAGNOSIS — I480 Paroxysmal atrial fibrillation: Secondary | ICD-10-CM | POA: Diagnosis not present

## 2024-06-29 DIAGNOSIS — I110 Hypertensive heart disease with heart failure: Secondary | ICD-10-CM | POA: Diagnosis not present

## 2024-06-29 DIAGNOSIS — N179 Acute kidney failure, unspecified: Secondary | ICD-10-CM | POA: Diagnosis not present

## 2024-06-29 DIAGNOSIS — I5032 Chronic diastolic (congestive) heart failure: Secondary | ICD-10-CM | POA: Diagnosis not present

## 2024-06-29 DIAGNOSIS — E860 Dehydration: Secondary | ICD-10-CM | POA: Diagnosis not present

## 2024-06-29 DIAGNOSIS — I50812 Chronic right heart failure: Secondary | ICD-10-CM | POA: Diagnosis not present

## 2024-07-02 ENCOUNTER — Encounter (HOSPITAL_COMMUNITY)

## 2024-07-03 ENCOUNTER — Encounter: Payer: Self-pay | Admitting: *Deleted

## 2024-07-03 DIAGNOSIS — E871 Hypo-osmolality and hyponatremia: Secondary | ICD-10-CM | POA: Diagnosis not present

## 2024-07-03 DIAGNOSIS — I5032 Chronic diastolic (congestive) heart failure: Secondary | ICD-10-CM | POA: Diagnosis not present

## 2024-07-03 DIAGNOSIS — I480 Paroxysmal atrial fibrillation: Secondary | ICD-10-CM | POA: Diagnosis not present

## 2024-07-03 DIAGNOSIS — G9341 Metabolic encephalopathy: Secondary | ICD-10-CM | POA: Diagnosis not present

## 2024-07-03 DIAGNOSIS — I110 Hypertensive heart disease with heart failure: Secondary | ICD-10-CM | POA: Diagnosis not present

## 2024-07-03 DIAGNOSIS — I2510 Atherosclerotic heart disease of native coronary artery without angina pectoris: Secondary | ICD-10-CM | POA: Diagnosis not present

## 2024-07-03 DIAGNOSIS — N179 Acute kidney failure, unspecified: Secondary | ICD-10-CM | POA: Diagnosis not present

## 2024-07-03 DIAGNOSIS — E860 Dehydration: Secondary | ICD-10-CM | POA: Diagnosis not present

## 2024-07-03 DIAGNOSIS — I50812 Chronic right heart failure: Secondary | ICD-10-CM | POA: Diagnosis not present

## 2024-07-05 ENCOUNTER — Encounter (HOSPITAL_COMMUNITY): Payer: Self-pay

## 2024-07-05 ENCOUNTER — Emergency Department (HOSPITAL_COMMUNITY)

## 2024-07-05 ENCOUNTER — Emergency Department (HOSPITAL_COMMUNITY)
Admission: EM | Admit: 2024-07-05 | Discharge: 2024-07-05 | Disposition: A | Discharge status: Home / Self Care | Attending: Emergency Medicine | Admitting: Emergency Medicine

## 2024-07-05 ENCOUNTER — Other Ambulatory Visit: Payer: Self-pay

## 2024-07-05 ENCOUNTER — Telehealth: Payer: Self-pay | Admitting: Family Medicine

## 2024-07-05 DIAGNOSIS — Z7982 Long term (current) use of aspirin: Secondary | ICD-10-CM | POA: Insufficient documentation

## 2024-07-05 DIAGNOSIS — R5381 Other malaise: Secondary | ICD-10-CM | POA: Diagnosis not present

## 2024-07-05 DIAGNOSIS — Z7901 Long term (current) use of anticoagulants: Secondary | ICD-10-CM | POA: Diagnosis not present

## 2024-07-05 DIAGNOSIS — R109 Unspecified abdominal pain: Secondary | ICD-10-CM | POA: Diagnosis not present

## 2024-07-05 DIAGNOSIS — R0781 Pleurodynia: Secondary | ICD-10-CM | POA: Diagnosis not present

## 2024-07-05 DIAGNOSIS — R531 Weakness: Secondary | ICD-10-CM | POA: Diagnosis not present

## 2024-07-05 DIAGNOSIS — K8020 Calculus of gallbladder without cholecystitis without obstruction: Secondary | ICD-10-CM | POA: Diagnosis not present

## 2024-07-05 DIAGNOSIS — K760 Fatty (change of) liver, not elsewhere classified: Secondary | ICD-10-CM | POA: Diagnosis not present

## 2024-07-05 DIAGNOSIS — N3289 Other specified disorders of bladder: Secondary | ICD-10-CM | POA: Diagnosis not present

## 2024-07-05 DIAGNOSIS — E871 Hypo-osmolality and hyponatremia: Secondary | ICD-10-CM | POA: Insufficient documentation

## 2024-07-05 LAB — CBC WITH DIFFERENTIAL/PLATELET
Abs Immature Granulocytes: 0.05 K/uL (ref 0.00–0.07)
Basophils Absolute: 0.1 K/uL (ref 0.0–0.1)
Basophils Relative: 1 %
Eosinophils Absolute: 0.2 K/uL (ref 0.0–0.5)
Eosinophils Relative: 3 %
HCT: 42.8 % (ref 36.0–46.0)
Hemoglobin: 14.6 g/dL (ref 12.0–15.0)
Immature Granulocytes: 1 %
Lymphocytes Relative: 12 %
Lymphs Abs: 0.8 K/uL (ref 0.7–4.0)
MCH: 34.1 pg — ABNORMAL HIGH (ref 26.0–34.0)
MCHC: 34.1 g/dL (ref 30.0–36.0)
MCV: 100 fL (ref 80.0–100.0)
Monocytes Absolute: 0.9 K/uL (ref 0.1–1.0)
Monocytes Relative: 13 %
Neutro Abs: 4.8 K/uL (ref 1.7–7.7)
Neutrophils Relative %: 70 %
Platelets: 204 K/uL (ref 150–400)
RBC: 4.28 MIL/uL (ref 3.87–5.11)
RDW: 16.2 % — ABNORMAL HIGH (ref 11.5–15.5)
WBC: 6.8 K/uL (ref 4.0–10.5)
nRBC: 0 % (ref 0.0–0.2)

## 2024-07-05 LAB — URINALYSIS, W/ REFLEX TO CULTURE (INFECTION SUSPECTED)
Bacteria, UA: NONE SEEN
Bilirubin Urine: NEGATIVE
Glucose, UA: NEGATIVE mg/dL
Hgb urine dipstick: NEGATIVE
Ketones, ur: NEGATIVE mg/dL
Leukocytes,Ua: NEGATIVE
Nitrite: NEGATIVE
Protein, ur: NEGATIVE mg/dL
Specific Gravity, Urine: 1.012 (ref 1.005–1.030)
pH: 7 (ref 5.0–8.0)

## 2024-07-05 LAB — COMPREHENSIVE METABOLIC PANEL WITH GFR
ALT: 23 U/L (ref 0–44)
AST: 29 U/L (ref 15–41)
Albumin: 3.2 g/dL — ABNORMAL LOW (ref 3.5–5.0)
Alkaline Phosphatase: 188 U/L — ABNORMAL HIGH (ref 38–126)
Anion gap: 13 (ref 5–15)
BUN: 29 mg/dL — ABNORMAL HIGH (ref 8–23)
CO2: 28 mmol/L (ref 22–32)
Calcium: 8.8 mg/dL — ABNORMAL LOW (ref 8.9–10.3)
Chloride: 86 mmol/L — ABNORMAL LOW (ref 98–111)
Creatinine, Ser: 0.66 mg/dL (ref 0.44–1.00)
GFR, Estimated: >60 mL/min (ref >60)
Glucose, Bld: 135 mg/dL — ABNORMAL HIGH (ref 70–99)
Potassium: 4.4 mmol/L (ref 3.5–5.1)
Sodium: 127 mmol/L — ABNORMAL LOW (ref 135–145)
Total Bilirubin: 1 mg/dL (ref 0.0–1.2)
Total Protein: 6.2 g/dL — ABNORMAL LOW (ref 6.5–8.1)

## 2024-07-05 LAB — LIPASE, BLOOD: Lipase: 34 U/L (ref 11–51)

## 2024-07-05 MED ORDER — OXYCODONE-ACETAMINOPHEN 5-325 MG PO TABS
1.0000 | ORAL_TABLET | Freq: Once | ORAL | Status: AC
  Administered 2024-07-05: 1 via ORAL
  Filled 2024-07-05: qty 1

## 2024-07-05 MED ORDER — IOHEXOL 300 MG/ML  SOLN
75.0000 mL | Freq: Once | INTRAMUSCULAR | Status: AC | PRN
  Administered 2024-07-05: 75 mL via INTRAVENOUS

## 2024-07-05 NOTE — ED Provider Notes (Signed)
 [place] EMERG[facility] DE[facility]MENT AT [facility]  Provider Note  CSN: [id number] Arrival date & time: 07/05/24 0114  History Chief Complaint  Patient presents with   Flank Pain    Norma Barajas is a 83 y.o. female with history of afib, not on AC brought to the ED via EMS from SNF for R flank pain ongoing during the day today and not relieved with her usual medications. No fever, vomiting, or dysuria.    Home Medications Prior to Admission medications   Medication Sig Start Date End Date Taking? Authorizing Provider  acetaminophen  (TYLENOL ) 325 MG tablet Take 2 tablets (650 mg total) by mouth 3 (three) times daily. 06/25/24   Vicci Afton CROME, MD  aluminum-magnesium  hydroxide 200-200 MG/5ML suspension Take 30 mLs by mouth as needed for indigestion (heartburn). Up to 4 times a day in 24 hours.    [provider]  aspirin  EC (ASPIRIN  LOW DOSE) 81 MG tablet Take 1 tablet (81 mg total) by mouth daily with breakfast. Swallow whole. 06/14/24   Pearlean Manus, MD  cholecalciferol  (VITAMIN D3) 25 MCG (1000 UNIT) tablet Take 1,000 Units by mouth in the morning.    [provider]  feeding supplement (ENSURE PLUS HIGH PROTEIN) LIQD Take 237 mLs by mouth 2 (two) times daily between meals. Patient taking differently: Take 237 mLs by mouth 3 (three) times daily between meals. 06/14/24   Pearlean Manus, MD  hydrALAZINE  (APRESOLINE ) 10 MG tablet Take 1 tablet (10 mg total) by mouth 3 (three) times daily. 06/27/24   Severa Rock HERO, FNP  isosorbide  mononitrate (IMDUR ) 30 MG 24 hr tablet Take 1 tablet (30 mg total) by mouth daily. 06/27/24   Severa Rock HERO, FNP  lidocaine  (LIDODERM ) 5 % Place 2 patches onto the skin daily as needed (back pain). Remove & Discard patch within 12 hours or as directed by MD 06/25/24   Vicci Afton CROME, MD  meclizine  (ANTIVERT ) 25 MG tablet TAKE 1 TABLET THREE TIMES DAILY AS NEEDED FOR DIZZINESS 06/27/24   Severa Rock HERO, FNP  metoprolol  succinate  (TOPROL -XL) 50 MG 24 hr tablet Take 3 tablets (150 mg total) by mouth 2 (two) times daily. Take with or immediately following a meal. 06/25/24   Johnson, Clanford L, MD  midodrine  (PROAMATINE ) 2.5 MG tablet Take 1 tablet (2.5 mg total) by mouth 2 (two) times daily with a meal. 03/01/24   Rakes, Rock HERO, FNP  omeprazole  (PRILOSEC) 20 MG capsule Take 1 capsule (20 mg total) by mouth daily. 03/01/24   Severa Rock HERO, FNP  ondansetron  (ZOFRAN ) 4 MG tablet Take 1 tablet (4 mg total) by mouth every 8 (eight) hours as needed for nausea or vomiting. 06/27/24   Rakes, Rock HERO, FNP  oxyCODONE  (OXY IR/ROXICODONE ) 5 MG immediate release tablet Take 0.5 tablets (2.5 mg total) by mouth every 4 (four) hours as needed for severe pain (pain score 7-10) or breakthrough pain. 06/25/24   Johnson, Clanford L, MD  polyethylene glycol (MIRALAX  / GLYCOLAX ) 17 g packet Take 17 g by mouth daily. 06/25/24   Johnson, Clanford L, MD  potassium chloride  (KLOR-CON  M) 10 MEQ tablet TAKE 1 TABLET EVERY DAY (TAKE WHILE TAKING DEMADEX /TORSEMIDE  AS DIRECTED) 03/06/24   Severa Rock HERO, FNP  sertraline  (ZOLOFT ) 25 MG tablet Take 1 tablet (25 mg total) by mouth daily. 06/27/24   Severa Rock HERO, FNP  simvastatin  (ZOCOR ) 20 MG tablet Take 1 tablet (20 mg total) by mouth at bedtime. 06/25/24   Vicci,  Clanford L, MD  torsemide  (DEMADEX ) 20 MG tablet Take 1 tablet (20 mg total) by mouth every Monday, Wednesday, and Friday. 06/15/24   Pearlean Manus, MD  traZODone  (DESYREL ) 50 MG tablet Take 1 tablet (50 mg total) by mouth at bedtime as needed for sleep. 03/01/24   Severa Rock HERO, FNP     Allergies    Codeine, Triamterene-hctz, Atorvastatin, Crestor [rosuvastatin calcium ], Doxycycline , Morphine , and Risedronate sodium   Review of Systems   Review of Systems Please see HPI for pertinent positives and negatives  Physical Exam BP (!) 160/97   Pulse (!) 111   Temp 97.8 F (36.6 C) (Oral)   Resp 15   Ht 4' 8 (1.422 m)   Wt 49 kg   SpO2 95%    BMI 24.22 kg/m   Physical Exam Vitals and nursing note reviewed.  Constitutional:      Appearance: Normal appearance.  HENT:     Head: Normocephalic and atraumatic.     Nose: Nose normal.     Mouth/Throat:     Mouth: Mucous membranes are moist.  Eyes:     Extraocular Movements: Extraocular movements intact.     Conjunctiva/sclera: Conjunctivae normal.  Cardiovascular:     Rate and Rhythm: Normal rate.  Pulmonary:     Effort: Pulmonary effort is normal.     Breath sounds: Normal breath sounds.  Abdominal:     General: Abdomen is flat.     Palpations: Abdomen is soft.     Tenderness: There is no abdominal tenderness.  Musculoskeletal:        General: Tenderness (R flank, lidoderm  patch in place) present. No swelling. Normal range of motion.     Cervical back: Neck supple.  Skin:    General: Skin is warm and dry.     Findings: No rash.  Neurological:     General: No focal deficit present.     Mental Status: She is alert.  Psychiatric:        Mood and Affect: Mood normal.     ED Results / Procedures / Treatments   EKG None  Procedures Procedures  Medications Ordered in the ED Medications  oxyCODONE -acetaminophen  (PERCOCET/ROXICET) 5-325 MG per tablet 1 tablet (has no administration in time range)  iohexol  (OMNIPAQUE ) 300 MG/ML solution 75 mL (75 mLs Intravenous Contrast Given 07/05/24 0248)    Initial Impression and Plan  Patient here with R flank pain, likely MSK but give history also consider renal infarct, less likely pyelo or renal stone. Will check labs and send for CT.   ED Course   Clinical Course as of 07/05/24 0321  Thu Jul 05, 2024  0205 CBC is unremarkable.  [CS]  0228 UA is clear [CS]  0239 CMP with hyponatremia similar to previous. Lipase is neg.  [CS]  [id number] I personally viewed the images from radiology studies and agree with radiologist interpretation: CT shows gall stones, but no signs of infection or obstruction and unlikely to be causing her  pain.  [CS]  0319 Niece is at bedside now. States their usual pharmacy could not fill her most recent oxycodone  prescription because they were out, she will try a different pharmacy in the AM. Will give a dose here for pain and send her back to SNF.  [CS]    Clinical Course User Index [CS] Roselyn Carlin NOVAK, MD     MDM Rules/Calculators/A&P Medical Decision Making Problems Addressed: Flank pain: acute illness or injury  Amount and/or Complexity of Data Reviewed  Labs: ordered. Decision-making details documented in ED Course. Radiology: ordered and independent interpretation performed. Decision-making details documented in ED Course.  Risk Prescription drug management.     Final Clinical Impression(s) / ED Diagnoses Final diagnoses:  Flank pain    Rx / DC Orders ED Discharge Orders     None        Roselyn Carlin NOVAK, MD 07/05/24 [phone]

## 2024-07-05 NOTE — Telephone Encounter (Signed)
 Done

## 2024-07-05 NOTE — ED Notes (Signed)
 Pt given hot pack for pain relief.

## 2024-07-05 NOTE — ED Notes (Addendum)
 Pt has tenderness to right rib cage, pt has Lidoderm  patch applied to this area that was placed at facility- Dr Roselyn made aware.

## 2024-07-05 NOTE — ED Notes (Signed)
 Pt brief soiled. Pt cleaned and placed in new brief. NAD. Pt waiting for EMS to pick her up and take her back to [facility]. Family remains at bedside.

## 2024-07-05 NOTE — Telephone Encounter (Unsigned)
 Copied from CRM [id number]. Topic: Appointments - Scheduling Inquiry for Clinic >> Jul 05, 2024 10:15 AM Rosaria BRAVO wrote: Reason for CRM: Pt needs to be scheduled for an ED follow up visit, nothing available soon enough according to the patient's POA/Niece [name]  Best contact: [phone]  ----------------------------------------------------------------------- From previous Reason for Contact - Scheduling: Patient/patient representative is calling to schedule an appointment. Refer to attachments for appointment information.   Pt needs to be worked

## 2024-07-05 NOTE — ED Notes (Signed)
 Patient transported to CT

## 2024-07-05 NOTE — ED Triage Notes (Signed)
 Pt bib EMS from [facility] in [place] with c/o flank pain to right side that started this afternoon. Upon assessment, pt has tenderness to right rib cage upon palpation. Pt denies any other symptoms at this time.

## 2024-07-05 NOTE — ED Notes (Signed)
 Pt in CT.

## 2024-07-06 DIAGNOSIS — G9341 Metabolic encephalopathy: Secondary | ICD-10-CM | POA: Diagnosis not present

## 2024-07-06 DIAGNOSIS — E860 Dehydration: Secondary | ICD-10-CM | POA: Diagnosis not present

## 2024-07-06 DIAGNOSIS — E871 Hypo-osmolality and hyponatremia: Secondary | ICD-10-CM | POA: Diagnosis not present

## 2024-07-06 DIAGNOSIS — I5032 Chronic diastolic (congestive) heart failure: Secondary | ICD-10-CM | POA: Diagnosis not present

## 2024-07-06 DIAGNOSIS — N179 Acute kidney failure, unspecified: Secondary | ICD-10-CM | POA: Diagnosis not present

## 2024-07-06 DIAGNOSIS — I2510 Atherosclerotic heart disease of native coronary artery without angina pectoris: Secondary | ICD-10-CM | POA: Diagnosis not present

## 2024-07-06 DIAGNOSIS — I110 Hypertensive heart disease with heart failure: Secondary | ICD-10-CM | POA: Diagnosis not present

## 2024-07-06 DIAGNOSIS — I50812 Chronic right heart failure: Secondary | ICD-10-CM | POA: Diagnosis not present

## 2024-07-06 DIAGNOSIS — I480 Paroxysmal atrial fibrillation: Secondary | ICD-10-CM | POA: Diagnosis not present

## 2024-07-10 ENCOUNTER — Ambulatory Visit (INDEPENDENT_AMBULATORY_CARE_PROVIDER_SITE_OTHER): Admitting: Family Medicine

## 2024-07-10 ENCOUNTER — Encounter: Payer: Self-pay | Admitting: Family Medicine

## 2024-07-10 VITALS — BP 129/74 | HR 115 | Temp 97.8°F

## 2024-07-10 DIAGNOSIS — E440 Moderate protein-calorie malnutrition: Secondary | ICD-10-CM

## 2024-07-10 DIAGNOSIS — I480 Paroxysmal atrial fibrillation: Secondary | ICD-10-CM

## 2024-07-10 DIAGNOSIS — R627 Adult failure to thrive: Secondary | ICD-10-CM

## 2024-07-10 DIAGNOSIS — R110 Nausea: Secondary | ICD-10-CM

## 2024-07-10 DIAGNOSIS — F339 Major depressive disorder, recurrent, unspecified: Secondary | ICD-10-CM

## 2024-07-10 NOTE — Progress Notes (Signed)
 Subjective:  Patient ID: Norma Barajas, female    DOB: [birth year], 83 y.o.   MRN: [id number]  Patient Care Team: Severa Rock HERO, FNP as PCP - General (Family Medicine) Lavona Agent, MD as PCP - Cardiology (Cardiology) Harvey Margo CROME, MD (Inactive) as Attending Physician (Gastroenterology) Nivia Sally Alice, OD as Consulting Physician (Optometry) Dow Arland [name], NP (Inactive) as Consulting Physician (Cardiology)   Chief Complaint:  Referral (Palliative care referral /) and Anorexia (Loss of appetite that has been ongoing)   HPI: Norma Barajas is a 83 y.o. female presenting on 07/10/2024 for Referral (Palliative care referral /) and Anorexia (Loss of appetite that has been ongoing)   Norma Barajas is an 83 year old female who presents with poor oral intake.  She experiences gastrointestinal symptoms, describing a sensation of 'sick on the stomach' and difficulty with food and drink intake, stating that food 'won't go down.' Her fluid intake is estimated to be small sips daily.   She has urinary incontinence, frequently wetting herself, and sometimes experiences an inability to urinate. She often desires to lie down and is frequently found lying over on the couch.  Nutritional support is being provided by her caregiver with Boost shakes, supplemented with 30 mg of protein. However, she is reluctant to consume them, expressing a dislike for the taste.  She feels 'sick' after taking her medications, attributing this to not eating beforehand, although she confirms adherence to her prescribed medication regimen.  She experiences palpitations and feelings of  her heart racing.  She expresses feelings of loneliness and a lack of desire to live, stating 'I'm just by myself.' She lives at [facility].         Relevant past medical, surgical, family, and social history reviewed and updated as indicated.  Allergies and medications reviewed and updated. Data reviewed: Chart in Epic.   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 Nonobstructive minimal CAD 10/2005.   Depression    Diastolic dysfunction    Grade 1. Ejection fraction 60-65%.   Dysrhythmia    a fib   Erosive esophagitis    Essential hypertension    Fracture of ramus of right pubis with routine healing 12/09/2020   GERD (gastroesophageal reflux disease)    Grade III hemorrhoids    History of hiatal hernia    History of prediabetes    Hypercholesterolemia    Internal hemorrhoids with complication 07/29/2008   OCT 2015 FLEX SIG/IH BANDING     Intertrochanteric fracture of femur (HCC)    5/16 - 03/26/2021 fracture sustained in a mechanical fall.  IM nailing performed 5/17 by Dr Oneil Horde Warfarin held because of posttraumatic pelvic hematoma.  Postop DVT with 81 mg aspirin  twice daily x28 days.   Major depressive disorder, recurrent, moderate (HCC) 12/09/2020   Migraine    used to have them right bad; I don't now (04/11/2015)   MITRAL REGURGITATION 04/27/2010   Qualifier: Diagnosis of  By: Delford, MD, CODY Maude Dunnings    Osteoporosis    Paroxysmal atrial fibrillation [facility])    Pelvic hematoma, female 03/27/2021   Sustained in mechanical fall.  Warfarin held.  IM nailing performed 5/17 with postop DVT prophylaxis  81 mg twice daily x28 days.   PONV (postoperative nausea and vomiting)    Vertigo     Past Surgical History:  Procedure Laterality Date   BRAVO [facility] STUDY  11/15/2012   Procedure: BRAVO PH STUDY;  Surgeon: Margo LITTIE Haddock, MD;  Location: [facility];  Service: Endoscopy;;   CARDIOVERSION N/A 05/16/2020   Procedure: CARDIOVERSION;  Surgeon: Kate Lonni LITTIE, MD;  Location: [facility] ENDOSCOPY;  Service: Cardiovascular;  Laterality: N/A;   CATARACT EXTRACTION W/ INTRAOCULAR  LENS  IMPLANT, BILATERAL Bilateral    COLONOSCOPY  2008   Dr. Haddock: internal hemorrhoids    DILATION AND CURETTAGE OF UTERUS     ESOPHAGEAL DILATION N/A 06/16/2021   Procedure: ESOPHAGEAL DILATION;  Surgeon: Eartha Angelia Sieving, MD;  Location: [facility] END[facility];  Service: Gastroenterology;  Laterality: N/A;   ESOPHAGOGASTRODUODENOSCOPY (EGD) WITH ESOPHAGEAL DILATION  2001   Dr. Debrah: erosive esophagitis, esophageal stricture, duodenitis, s/p Savary dilation   ESOPHAGOGASTRODUODENOSCOPY (EGD) WITH ESOPHAGEAL DILATION  11/15/2012   DOQ:Zdneyjhzjo web was found & MOST LIKELY CAUSE FOR DYAPHAGIA/Polyp was found in the gastric body and gastric fundus/ gastritis on bx   ESOPHAGOGASTRODUODENOSCOPY (EGD) WITH PROPOFOL  N/A 06/16/2021   Procedure: ESOPHAGOGASTRODUODENOSCOPY (EGD) WITH PROPOFOL ;  Surgeon: Eartha Angelia Sieving, MD;  Location[facility];  Service: Gastroenterology;  Laterality: N/A;  8:15   EYE SURGERY Bilateral    laser OR after cataract OR; cause I couldn't see   FLEXIBLE SIGMOIDOSCOPY N/A 08/22/2014   mild diverticulosis in sigmoid, moderate sized Grade 3 hemorrhoids s/p banding X 3.    FRACTURE SURGERY Right    below the knee - 2 bones broke has plates   HEMORRHOID BANDING N/A 08/22/2014   Procedure: HEMORRHOID BANDING;  Surgeon: Margo LITTIE Haddock, MD;  Location[facility];  Service: Endoscopy;  Laterality: N/A;   HEMORRHOID SURGERY N/A 03/30/2018   Procedure: EXTENSIVE HEMORRHOIDECTOMY;  Surgeon: Kallie Manuelita [name], MD;  Location: [facility] ORS;  Service: General;  Laterality: N/A;   INTRAMEDULLARY (IM) NAIL INTERTROCHANTERIC Right 03/24/2021   Procedure: INTRAMEDULLARY (IM) NAIL INTERTROCHANTRIC;  Surgeon: Onesimo Oneil LABOR, MD;  Location: [facility] ORS;  Service: Orthopedics;  Laterality: Right;   OPEN REDUCTION INTERNAL FIXATION (ORIF) TIBIA/FIBULA FRACTURE Right 2013   broke tibia and fibula after falling down stairs   PROLAPSED UTERINE FIBROID LIGATION  2015   TOTAL ABDOMINAL  HYSTERECTOMY      Social History   Socioeconomic History   Marital status: Widowed    Spouse name: george    Number of children: 1   Years of education: Not on file   Highest education level: Not on file  Occupational History   Occupation: Retired    Comment: Textile  Tobacco Use   Smoking status: Never   Smokeless tobacco: Never  Vaping Use   Vaping status: Never Used  Substance and Sexual Activity   Alcohol use: No    Alcohol/week: 0.0 standard drinks of alcohol   Drug use: No   Sexual activity: Not Currently    Birth control/protection: Surgical, Post-menopausal    Comment: hyst  Other Topics Concern   Not on file  Social History Narrative   Married   No regular exercise   Social Drivers of Health   Financial Resource Strain: Low Risk  (07/08/2024)   Overall Financial Resource Strain (CARDIA)    Difficulty of Paying Living Expenses: Not hard at all  Food Insecurity: No Food Insecurity (07/08/2024)   Hunger Vital Sign    Worried About  Running Out of Food in the Last Year: Never true    Ran Out of Food in the Last Year: Never true  Transportation Needs: No Transportation Needs (07/08/2024)   PRAPARE - Administrator, Civil Service (Medical): No    Lack of Transportation (Non-Medical): No  Physical Activity: Inactive (07/08/2024)   Exercise Vital Sign    Days of Exercise per Week: 0 days    Minutes of Exercise per Session: Not on file  Stress: Stress Concern Present (07/08/2024)   [employer] of Occupational Health - Occupational Stress Questionnaire    Feeling of Stress: Very much  Social Connections: Socially Isolated (07/08/2024)   Social Connection and Isolation Panel    Frequency of Communication with Friends and Family: Never    Frequency of Social Gatherings with Friends and Family: Never    Attends Religious Services: Never    Database administrator or Organizations: No    Attends Engineer, structural: Not on file    Marital  Status: Widowed  Intimate Partner Violence: Not At Risk (06/22/2024)   Humiliation, Afraid, Rape, and Kick questionnaire    Fear of Current or Ex-Partner: No    Emotionally Abused: No    Physically Abused: No    Sexually Abused: No    Outpatient Encounter Medications as of 07/10/2024  Medication Sig   acetaminophen  (TYLENOL ) 325 MG tablet Take 2 tablets (650 mg total) by mouth 3 (three) times daily.   aluminum-magnesium  hydroxide 200-200 MG/5ML suspension Take 30 mLs by mouth as needed for indigestion (heartburn). Up to 4 times a day in 24 hours.   aspirin  EC (ASPIRIN  LOW DOSE) 81 MG tablet Take 1 tablet (81 mg total) by mouth daily with breakfast. Swallow whole.   cholecalciferol  (VITAMIN D3) 25 MCG (1000 UNIT) tablet Take 1,000 Units by mouth in the morning.   feeding supplement (ENSURE PLUS HIGH PROTEIN) LIQD Take 237 mLs by mouth 2 (two) times daily between meals. (Patient taking differently: Take 237 mLs by mouth 3 (three) times daily between meals.)   hydrALAZINE  (APRESOLINE ) 10 MG tablet Take 1 tablet (10 mg total) by mouth 3 (three) times daily.   isosorbide  mononitrate (IMDUR ) 30 MG 24 hr tablet Take 1 tablet (30 mg total) by mouth daily.   lidocaine  (LIDODERM ) 5 % Place 2 patches onto the skin daily as needed (back pain). Remove & Discard patch within 12 hours or as directed by MD   meclizine  (ANTIVERT ) 25 MG tablet TAKE 1 TABLET THREE TIMES DAILY AS NEEDED FOR DIZZINESS   metoprolol  succinate (TOPROL -XL) 50 MG 24 hr tablet Take 3 tablets (150 mg total) by mouth 2 (two) times daily. Take with or immediately following a meal.   midodrine  (PROAMATINE ) 2.5 MG tablet Take 1 tablet (2.5 mg total) by mouth 2 (two) times daily with a meal.   omeprazole  (PRILOSEC) 20 MG capsule Take 1 capsule (20 mg total) by mouth daily.   ondansetron  (ZOFRAN ) 4 MG tablet Take 1 tablet (4 mg total) by mouth every 8 (eight) hours as needed for nausea or vomiting.   oxyCODONE  (OXY IR/ROXICODONE ) 5 MG immediate  release tablet Take 0.5 tablets (2.5 mg total) by mouth every 4 (four) hours as needed for severe pain (pain score 7-10) or breakthrough pain.   polyethylene glycol (MIRALAX  / GLYCOLAX ) 17 g packet Take 17 g by mouth daily.   potassium chloride  (KLOR-CON  M) 10 MEQ tablet TAKE 1 TABLET EVERY DAY (TAKE WHILE TAKING DEMADEX /TORSEMIDE  AS DIRECTED)  sertraline  (ZOLOFT ) 25 MG tablet Take 1 tablet (25 mg total) by mouth daily.   simvastatin  (ZOCOR ) 20 MG tablet Take 1 tablet (20 mg total) by mouth at bedtime.   torsemide  (DEMADEX ) 20 MG tablet Take 1 tablet (20 mg total) by mouth every Monday, Wednesday, and Friday.   traZODone  (DESYREL ) 50 MG tablet Take 1 tablet (50 mg total) by mouth at bedtime as needed for sleep.   No facility-administered encounter medications on file as of 07/10/2024.    Allergies  Allergen Reactions   Codeine Nausea And Vomiting   Triamterene-Hctz Other (See Comments)    Weakness    Atorvastatin Other (See Comments)    Myalgias    Crestor [Rosuvastatin Calcium ] Other (See Comments)    Weakness    Doxycycline  Other (See Comments)    Chest congestion   Morphine  Nausea Only   Risedronate Sodium Other (See Comments)    ACTONEL - reflux    Pertinent ROS per HPI, otherwise unremarkable      Objective:  BP 129/74   Pulse (!) 115   Temp 97.8 F (36.6 C)   SpO2 90%    Wt Readings from Last 3 Encounters:  07/05/24 108 lb 0.4 oz (49 kg)  06/25/24 108 lb 0.4 oz (49 kg)  06/21/24 103 lb (46.7 kg)    Physical Exam Vitals and nursing note reviewed.  Constitutional:      General: She is not in acute distress.    Appearance: She is ill-appearing. She is not toxic-appearing or diaphoretic.     Comments: Frail elderly, chronically ill  HENT:     Head: Normocephalic and atraumatic.     Nose: Nose normal.     Mouth/Throat:     Mouth: Mucous membranes are dry.  Eyes:     Pupils: Pupils are equal, round, and reactive to light.  Cardiovascular:     Rate and Rhythm:  Normal rate. Rhythm irregularly irregular.     Heart sounds: Murmur heard.  Pulmonary:     Effort: Pulmonary effort is normal.     Breath sounds: Normal breath sounds.  Musculoskeletal:     Right lower leg: 1+ Edema present.     Left lower leg: 1+ Edema present.     Comments: Kyphosis  Skin:    General: Skin is warm and dry.     Capillary Refill: Capillary refill takes less than 2 seconds.  Neurological:     Motor: Weakness (generalized) present.     Gait: Gait abnormal (in wheelchair).  Psychiatric:        Mood and Affect: Mood is depressed. Affect is flat.        Speech: Speech is delayed.        Behavior: Behavior is slowed and withdrawn.      Results for orders placed or performed during the hospital encounter of 07/05/24  Comprehensive metabolic panel   Collection Time: 07/05/24  1:43 AM  Result Value Ref Range   Sodium 127 (L) 135 - 145 mmol/L   Potassium 4.4 3.5 - 5.1 mmol/L   Chloride 86 (L) 98 - 111 mmol/L   CO2 28 22 - 32 mmol/L   Glucose, Bld 135 (H) 70 - 99 mg/dL   BUN 29 (H) 8 - 23 mg/dL   Creatinine, Ser 9.33 0.44 - 1.00 mg/dL   Calcium  8.8 (L) 8.9 - 10.3 mg/dL   Total Protein 6.2 (L) 6.5 - 8.1 g/dL   Albumin 3.2 (L) 3.5 - 5.0 g/dL   AST  29 15 - 41 U/L   ALT 23 0 - 44 U/L   Alkaline Phosphatase 188 (H) 38 - 126 U/L   Total Bilirubin 1.0 0.0 - 1.2 mg/dL   GFR, Estimated >39 >39 mL/min   Anion gap 13 5 - 15  Lipase, blood   Collection Time: 07/05/24  1:43 AM  Result Value Ref Range   Lipase 34 11 - 51 U/L  CBC with Differential   Collection Time: 07/05/24  1:43 AM  Result Value Ref Range   WBC 6.8 4.0 - 10.5 K/uL   RBC 4.28 3.87 - 5.11 MIL/uL   Hemoglobin 14.6 12.0 - 15.0 g/dL   HCT 57.1 63.9 - 53.9 %   MCV 100.0 80.0 - 100.0 fL   MCH 34.1 (H) 26.0 - 34.0 pg   MCHC 34.1 30.0 - 36.0 g/dL   RDW 83.7 (H) 88.4 - 84.4 %   Platelets 204 150 - 400 K/uL   nRBC 0.0 0.0 - 0.2 %   Neutrophils Relative % 70 %   Neutro Abs 4.8 1.7 - 7.7 K/uL   Lymphocytes  Relative 12 %   Lymphs Abs 0.8 0.7 - 4.0 K/uL   Monocytes Relative 13 %   Monocytes Absolute 0.9 0.1 - 1.0 K/uL   Eosinophils Relative 3 %   Eosinophils Absolute 0.2 0.0 - 0.5 K/uL   Basophils Relative 1 %   Basophils Absolute 0.1 0.0 - 0.1 K/uL   Immature Granulocytes 1 %   Abs Immature Granulocytes 0.05 0.00 - 0.07 K/uL  Urinalysis, w/ Reflex to Culture (Infection Suspected) -Urine, Catheterized   Collection Time: 07/05/24  1:49 AM  Result Value Ref Range   Specimen Source URINE, CATHETERIZED    Color, Urine YELLOW YELLOW   APPearance CLEAR CLEAR   Specific Gravity, Urine 1.012 1.005 - 1.030   pH 7.0 5.0 - 8.0   Glucose, UA NEGATIVE NEGATIVE mg/dL   Hgb urine dipstick NEGATIVE NEGATIVE   Bilirubin Urine NEGATIVE NEGATIVE   Ketones, ur NEGATIVE NEGATIVE mg/dL   Protein, ur NEGATIVE NEGATIVE mg/dL   Nitrite NEGATIVE NEGATIVE   Leukocytes,Ua NEGATIVE NEGATIVE   RBC / HPF 0-5 0 - 5 RBC/hpf   WBC, UA 0-5 0 - 5 WBC/hpf   Bacteria, UA NONE SEEN NONE SEEN   Squamous Epithelial / HPF 0-5 0 - 5 /HPF   *Note: Due to a large number of results and/or encounters for the requested time period, some results have not been displayed. A complete set of results can be found in Results Review.       Pertinent labs & imaging results that were available during my care of the patient were reviewed by me and considered in my medical decision making.  Assessment & Plan:  Norma Barajas was seen today for referral and anorexia.  Diagnoses and all orders for this visit:  Failure to thrive in adult -     Amb Referral to Palliative Care -     CMP14+EGFR -     CBC with Differential/Platelet -     Thyroid  Panel With TSH  Depression, recurrent (HCC) -     Amb Referral to Palliative Care -     CMP14+EGFR -     CBC with Differential/Platelet -     Thyroid  Panel With TSH  Paroxysmal atrial fibrillation (HCC) -     Amb Referral to Palliative Care -     CMP14+EGFR -     CBC with Differential/Platelet -      Thyroid   Panel With TSH  Moderate protein-calorie malnutrition (HCC) -     Amb Referral to Palliative Care -     CMP14+EGFR -     CBC with Differential/Platelet -     Thyroid  Panel With TSH  Nausea -     Amb Referral to Palliative Care -     CMP14+EGFR -     CBC with Differential/Platelet -     Thyroid  Panel With TSH     Protein-calorie malnutrition and adult failure to thrive Severe protein-calorie malnutrition and failure to thrive due to inadequate oral intake, with low protein and albumin levels. She expresses a lack of desire to eat or drink, contributing to her poor nutritional intake. - Refer to [facility] for palliative care to improve comfort and manage illness - Encourage increased oral intake of food and fluids  Depression Depression with expressions of hopelessness and lack of desire to live, contributing to poor nutritional intake and overall health decline. Family history of similar behavior. - Refer to [facility] for palliative care to address emotional and psychological needs - Encourage social interaction and participation in communal activities to improve mood  Nausea Persistent nausea possibly related to inadequate food intake and medication use without food. She reports nausea when taking medications on an empty stomach.  Paroxysmal atrial fibrillation Paroxysmal atrial fibrillation with episodes of palpitations and heart racing, indicating possible AFib exacerbation. - Order blood work to assess renal function, liver function, and protein levels          Continue all other maintenance medications.  Follow up plan: Return if symptoms worsen or fail to improve.   Continue healthy lifestyle choices, including diet (rich in fruits, vegetables, and lean proteins, and low in salt and simple carbohydrates) and exercise (at least 30 minutes of moderate physical activity daily).   The above assessment and management plan was discussed with the patient. The patient  verbalized understanding of and has agreed to the management plan. Patient is aware to call the clinic if they develop any new symptoms or if symptoms persist or worsen. Patient is aware when to return to the clinic for a follow-up visit. Patient educated on when it is appropriate to go to the emergency department.   Norma Bruns, FNP-C [facility] [phone]

## 2024-07-11 DIAGNOSIS — I110 Hypertensive heart disease with heart failure: Secondary | ICD-10-CM | POA: Diagnosis not present

## 2024-07-11 DIAGNOSIS — I5032 Chronic diastolic (congestive) heart failure: Secondary | ICD-10-CM | POA: Diagnosis not present

## 2024-07-11 DIAGNOSIS — I50812 Chronic right heart failure: Secondary | ICD-10-CM | POA: Diagnosis not present

## 2024-07-11 DIAGNOSIS — G9341 Metabolic encephalopathy: Secondary | ICD-10-CM | POA: Diagnosis not present

## 2024-07-11 DIAGNOSIS — E871 Hypo-osmolality and hyponatremia: Secondary | ICD-10-CM | POA: Diagnosis not present

## 2024-07-11 DIAGNOSIS — N179 Acute kidney failure, unspecified: Secondary | ICD-10-CM | POA: Diagnosis not present

## 2024-07-11 DIAGNOSIS — I480 Paroxysmal atrial fibrillation: Secondary | ICD-10-CM | POA: Diagnosis not present

## 2024-07-11 DIAGNOSIS — E860 Dehydration: Secondary | ICD-10-CM | POA: Diagnosis not present

## 2024-07-11 DIAGNOSIS — I2510 Atherosclerotic heart disease of native coronary artery without angina pectoris: Secondary | ICD-10-CM | POA: Diagnosis not present

## 2024-07-11 LAB — CMP14+EGFR
ALT: 19 IU/L (ref 0–32)
AST: 27 IU/L (ref 0–40)
Albumin: 3.8 g/dL (ref 3.7–4.7)
Alkaline Phosphatase: 214 IU/L — AB (ref 44–121)
BUN/Creatinine Ratio: 38 — AB (ref 12–28)
BUN: 29 mg/dL — AB (ref 8–27)
Bilirubin Total: 0.8 mg/dL (ref 0.0–1.2)
CO2: 23 mmol/L (ref 20–29)
Calcium: 8.6 mg/dL — AB (ref 8.7–10.3)
Chloride: 88 mmol/L — AB (ref 96–106)
Creatinine, Ser: 0.77 mg/dL (ref 0.57–1.00)
Globulin, Total: 2.4 g/dL (ref 1.5–4.5)
Glucose: 102 mg/dL — AB (ref 70–99)
Potassium: 5 mmol/L (ref 3.5–5.2)
Sodium: 126 mmol/L — AB (ref 134–144)
Total Protein: 6.2 g/dL (ref 6.0–8.5)
eGFR: 76 mL/min/1.73 (ref >59)

## 2024-07-11 LAB — CBC WITH DIFFERENTIAL/PLATELET
Basophils Absolute: 0 x10E3/uL (ref 0.0–0.2)
Basos: 1 %
EOS (ABSOLUTE): 0 x10E3/uL (ref 0.0–0.4)
Eos: 0 %
Hematocrit: 40.5 % (ref 34.0–46.6)
Hemoglobin: 13.7 g/dL (ref 11.1–15.9)
Immature Grans (Abs): 0.1 x10E3/uL (ref 0.0–0.1)
Immature Granulocytes: 1 %
Lymphocytes Absolute: 0.9 x10E3/uL (ref 0.7–3.1)
Lymphs: 10 %
MCH: 34.3 pg — ABNORMAL HIGH (ref 26.6–33.0)
MCHC: 33.8 g/dL (ref 31.5–35.7)
MCV: 101 fL — ABNORMAL HIGH (ref 79–97)
Monocytes Absolute: 1.2 x10E3/uL — ABNORMAL HIGH (ref 0.1–0.9)
Monocytes: 15 %
Neutrophils Absolute: 6.2 x10E3/uL (ref 1.4–7.0)
Neutrophils: 73 %
Platelets: 210 x10E3/uL (ref 150–450)
RBC: 4 x10E6/uL (ref 3.77–5.28)
RDW: 14.5 % (ref 11.7–15.4)
WBC: 8.4 x10E3/uL (ref 3.4–10.8)

## 2024-07-11 LAB — THYROID PANEL WITH TSH
Free Thyroxine Index: 2.3 (ref 1.2–4.9)
T3 Uptake Ratio: 30 (ref 24–39)
T4, Total: 7.5 ug/dL (ref 4.5–12.0)
TSH: 2.07 u[IU]/mL (ref 0.450–4.500)

## 2024-07-12 ENCOUNTER — Ambulatory Visit: Payer: Self-pay

## 2024-07-12 DIAGNOSIS — G9341 Metabolic encephalopathy: Secondary | ICD-10-CM | POA: Diagnosis not present

## 2024-07-12 DIAGNOSIS — E871 Hypo-osmolality and hyponatremia: Secondary | ICD-10-CM | POA: Diagnosis not present

## 2024-07-12 DIAGNOSIS — I50812 Chronic right heart failure: Secondary | ICD-10-CM | POA: Diagnosis not present

## 2024-07-12 DIAGNOSIS — I110 Hypertensive heart disease with heart failure: Secondary | ICD-10-CM | POA: Diagnosis not present

## 2024-07-12 DIAGNOSIS — E860 Dehydration: Secondary | ICD-10-CM | POA: Diagnosis not present

## 2024-07-12 DIAGNOSIS — I5032 Chronic diastolic (congestive) heart failure: Secondary | ICD-10-CM | POA: Diagnosis not present

## 2024-07-12 DIAGNOSIS — I480 Paroxysmal atrial fibrillation: Secondary | ICD-10-CM | POA: Diagnosis not present

## 2024-07-12 DIAGNOSIS — N179 Acute kidney failure, unspecified: Secondary | ICD-10-CM | POA: Diagnosis not present

## 2024-07-12 DIAGNOSIS — I2510 Atherosclerotic heart disease of native coronary artery without angina pectoris: Secondary | ICD-10-CM | POA: Diagnosis not present

## 2024-07-12 NOTE — Telephone Encounter (Signed)
 Returned call to niece and advised her to take patient to the ER.  She refused stating that patient can not walk and everything was taken out of her reach.  States that when her sitter was with her patient asked her to move the scissors since she was having thoughts of hurting herself.  This was reported to [facility] med teach and they are going to talk to the provider on staff to see if she can be given anything else.  Hospice does come in tomorrow for evaluation on patient.  Niece states if patient makes that comment again tomorrow she will call EMS to come get her.   FYI - will send to PCP and covering provider

## 2024-07-12 NOTE — Telephone Encounter (Signed)
 FYI Only or Action Required?: FYI only for provider.  Patient was last seen in primary care on 07/10/2024 by Norma Rock HERO, FNP.  Called Nurse Triage reporting Suicidal.  Symptoms began today.  Interventions attempted: Other: patient asked her sitter to have scissors removed and informed her of her SI.  Symptoms are: unchanged.  Triage Disposition: See Physician Within 24 Hours  Patient/caregiver understands and will follow disposition?: No, wishes to speak with PCP         Copied from CRM [id number]. Topic: Clinical - Red Word Triage >> Jul 12, 2024 12:57 PM Avram MATSU wrote: Red Word that prompted transfer to Nurse Triage: pt stated she wanted to hurt herself.           Reason for Disposition  Sometimes has thoughts of suicide  Answer Assessment - Initial Assessment Questions Patient currently has a sitter with her until 5pm today. Patient's DPR [name], would like to know  if the patient's anxiety medication could be increased. She states that she is unable to bring the patient in for an appointment at this time. She would like a call back at [phone] when able.          1. MAIN CONCERN: What happened that made you call today?     Patient informed sitter that she should take away her scissors because she is experiencing suicidal thoughts  2. RISK OF HARM - SUICIDAL IDEATION:  Do you ever have thoughts of hurting or killing yourself?  (e.g., yes, no, no but preoccupation with thoughts about death)     Patient reported thoughts of self harm to her sitter.  3. RISK OF HARM - SUICIDE ATTEMPT: Have you tried to harm yourself recently? If Yes, ask: When was this?  What type of harm was tried?     No 4. RISK OF HARM - SUICIDAL BEHAVIOR: Have you ever done anything, started to do anything, or prepared to do anything to end your life? (e.g., collected pills, bought a gun, wrote a suicide note, cut yourself, started but changed your mind)     No 5.  EVENTS AND STRESSORS: Has there been any new stress or recent changes in your life? (e.g., death of loved one, homelessness, negative event, relationship breakup, work)     Patient on palliative care 6. FUNCTIONAL IMPAIRMENT: How have things been going for you overall? Have you had more difficulty than usual doing your normal daily activities?  (e.g., better, same, worse; self-care, school, work, interactions)     Worse 7. SUPPORT: Who is with you now? Who do you live with? Do you have family or friends who you can talk to?      Yes, family 8. THERAPIST: Do you have a counselor or therapist? If Yes, ask: What is their name?     No 9. ALCOHOL USE OR SUBSTANCE USE (DRUG USE): Do you drink alcohol or use any illegal drugs (or prescription drugs in ways other than prescribed)?     No 10. OTHER: Do you have any other physical symptoms right now? (e.g., fever)       No new symptoms  Protocols used: Suicide Concerns-A-AH

## 2024-07-15 ENCOUNTER — Ambulatory Visit: Payer: Self-pay | Admitting: Family Medicine

## 2024-07-16 NOTE — Telephone Encounter (Signed)
 Refer to lab results- hospice came in Friday.

## 2024-07-25 ENCOUNTER — Ambulatory Visit: Admitting: Family Medicine

## 2024-07-26 ENCOUNTER — Other Ambulatory Visit: Payer: Self-pay | Admitting: Family Medicine

## 2024-07-26 DIAGNOSIS — K219 Gastro-esophageal reflux disease without esophagitis: Secondary | ICD-10-CM

## 2024-07-26 DIAGNOSIS — E782 Mixed hyperlipidemia: Secondary | ICD-10-CM

## 2024-07-26 DIAGNOSIS — I700 Atherosclerosis of aorta: Secondary | ICD-10-CM

## 2024-07-27 ENCOUNTER — Ambulatory Visit: Admitting: Family Medicine

## 2024-08-08 DEATH — deceased

## 2024-08-30 ENCOUNTER — Ambulatory Visit: Admitting: Family Medicine
